# Patient Record
Sex: Male | Born: 1950
Health system: Southern US, Community
[De-identification: ages and names within clinical notes are randomized; demographics above are authoritative.]

## PROBLEM LIST (undated history)

## (undated) DIAGNOSIS — I10 Essential (primary) hypertension: Secondary | ICD-10-CM

## (undated) DIAGNOSIS — I779 Disorder of arteries and arterioles, unspecified: Secondary | ICD-10-CM

## (undated) DIAGNOSIS — I739 Peripheral vascular disease, unspecified: Secondary | ICD-10-CM

## (undated) DIAGNOSIS — J439 Emphysema, unspecified: Secondary | ICD-10-CM

## (undated) DIAGNOSIS — E785 Hyperlipidemia, unspecified: Secondary | ICD-10-CM

## (undated) DIAGNOSIS — J449 Chronic obstructive pulmonary disease, unspecified: Secondary | ICD-10-CM

## (undated) DIAGNOSIS — E119 Type 2 diabetes mellitus without complications: Secondary | ICD-10-CM

## (undated) DIAGNOSIS — I2510 Atherosclerotic heart disease of native coronary artery without angina pectoris: Secondary | ICD-10-CM

## (undated) DIAGNOSIS — Z720 Tobacco use: Secondary | ICD-10-CM

## (undated) HISTORY — DX: Peripheral vascular disease, unspecified: I73.9

## (undated) HISTORY — DX: Tobacco use: Z72.0

## (undated) HISTORY — DX: Atherosclerotic heart disease of native coronary artery without angina pectoris: I25.10

## (undated) HISTORY — DX: Chronic obstructive pulmonary disease, unspecified: J44.9

## (undated) HISTORY — DX: Hyperlipidemia, unspecified: E78.5

## (undated) HISTORY — DX: Emphysema, unspecified: J43.9

## (undated) HISTORY — DX: Disorder of arteries and arterioles, unspecified: I77.9

## (undated) HISTORY — DX: Type 2 diabetes mellitus without complications: E11.9

## (undated) HISTORY — DX: Essential (primary) hypertension: I10

---

## 1998-01-31 ENCOUNTER — Emergency Department (HOSPITAL_COMMUNITY): Admission: EM | Admit: 1998-01-31 | Discharge: 1998-01-31 | Payer: Self-pay | Admitting: Emergency Medicine

## 1998-02-05 ENCOUNTER — Ambulatory Visit (HOSPITAL_COMMUNITY): Admission: RE | Admit: 1998-02-05 | Discharge: 1998-02-05 | Payer: Self-pay | Admitting: Emergency Medicine

## 1998-02-08 ENCOUNTER — Ambulatory Visit (HOSPITAL_COMMUNITY): Admission: RE | Admit: 1998-02-08 | Discharge: 1998-02-08 | Payer: Self-pay | Admitting: Hematology & Oncology

## 1998-02-11 ENCOUNTER — Ambulatory Visit (HOSPITAL_COMMUNITY): Admission: RE | Admit: 1998-02-11 | Discharge: 1998-02-11 | Payer: Self-pay | Admitting: Hematology & Oncology

## 1999-08-29 ENCOUNTER — Encounter: Payer: Self-pay | Admitting: Hematology & Oncology

## 1999-08-29 ENCOUNTER — Encounter: Admission: RE | Admit: 1999-08-29 | Discharge: 1999-08-29 | Payer: Self-pay | Admitting: Hematology & Oncology

## 2000-09-02 ENCOUNTER — Encounter: Payer: Self-pay | Admitting: Hematology & Oncology

## 2000-09-02 ENCOUNTER — Encounter: Admission: RE | Admit: 2000-09-02 | Discharge: 2000-09-02 | Payer: Self-pay | Admitting: Hematology & Oncology

## 2001-08-24 HISTORY — PX: LOWER EXTREMITY ANGIOGRAM: SHX5955

## 2001-09-02 ENCOUNTER — Encounter: Payer: Self-pay | Admitting: Hematology & Oncology

## 2001-09-02 ENCOUNTER — Ambulatory Visit (HOSPITAL_COMMUNITY): Admission: RE | Admit: 2001-09-02 | Discharge: 2001-09-02 | Payer: Self-pay | Admitting: Hematology & Oncology

## 2001-10-13 ENCOUNTER — Ambulatory Visit: Admission: RE | Admit: 2001-10-13 | Discharge: 2001-10-13 | Payer: Self-pay | Admitting: Family Medicine

## 2002-03-20 ENCOUNTER — Encounter (INDEPENDENT_AMBULATORY_CARE_PROVIDER_SITE_OTHER): Payer: Self-pay | Admitting: *Deleted

## 2002-03-20 ENCOUNTER — Ambulatory Visit (HOSPITAL_BASED_OUTPATIENT_CLINIC_OR_DEPARTMENT_OTHER): Admission: RE | Admit: 2002-03-20 | Discharge: 2002-03-20 | Payer: Self-pay | Admitting: *Deleted

## 2002-08-18 ENCOUNTER — Ambulatory Visit (HOSPITAL_COMMUNITY): Admission: RE | Admit: 2002-08-18 | Discharge: 2002-08-19 | Payer: Self-pay | Admitting: Cardiovascular Disease

## 2003-02-19 ENCOUNTER — Emergency Department (HOSPITAL_COMMUNITY): Admission: EM | Admit: 2003-02-19 | Discharge: 2003-02-20 | Payer: Self-pay | Admitting: Emergency Medicine

## 2003-02-20 ENCOUNTER — Encounter: Payer: Self-pay | Admitting: Emergency Medicine

## 2006-11-23 HISTORY — PX: LOWER EXTREMITY ANGIOGRAM: SHX5955

## 2006-11-30 ENCOUNTER — Encounter: Admission: RE | Admit: 2006-11-30 | Discharge: 2006-11-30 | Payer: Self-pay | Admitting: Cardiovascular Disease

## 2006-11-30 IMAGING — CR DG CHEST 2V
2 series · 2 of 2 positions shown · non-contrast
Comparison: none

CLINICAL DATA: Peripheral vascular disease.  Pre angiogram. 
 CHEST X-RAY: 
 Two views of the chest show no active infiltrative or effusion.  Mild peribronchial thickening is noted.  The heart is within normal limits in size.

[w chest pa]
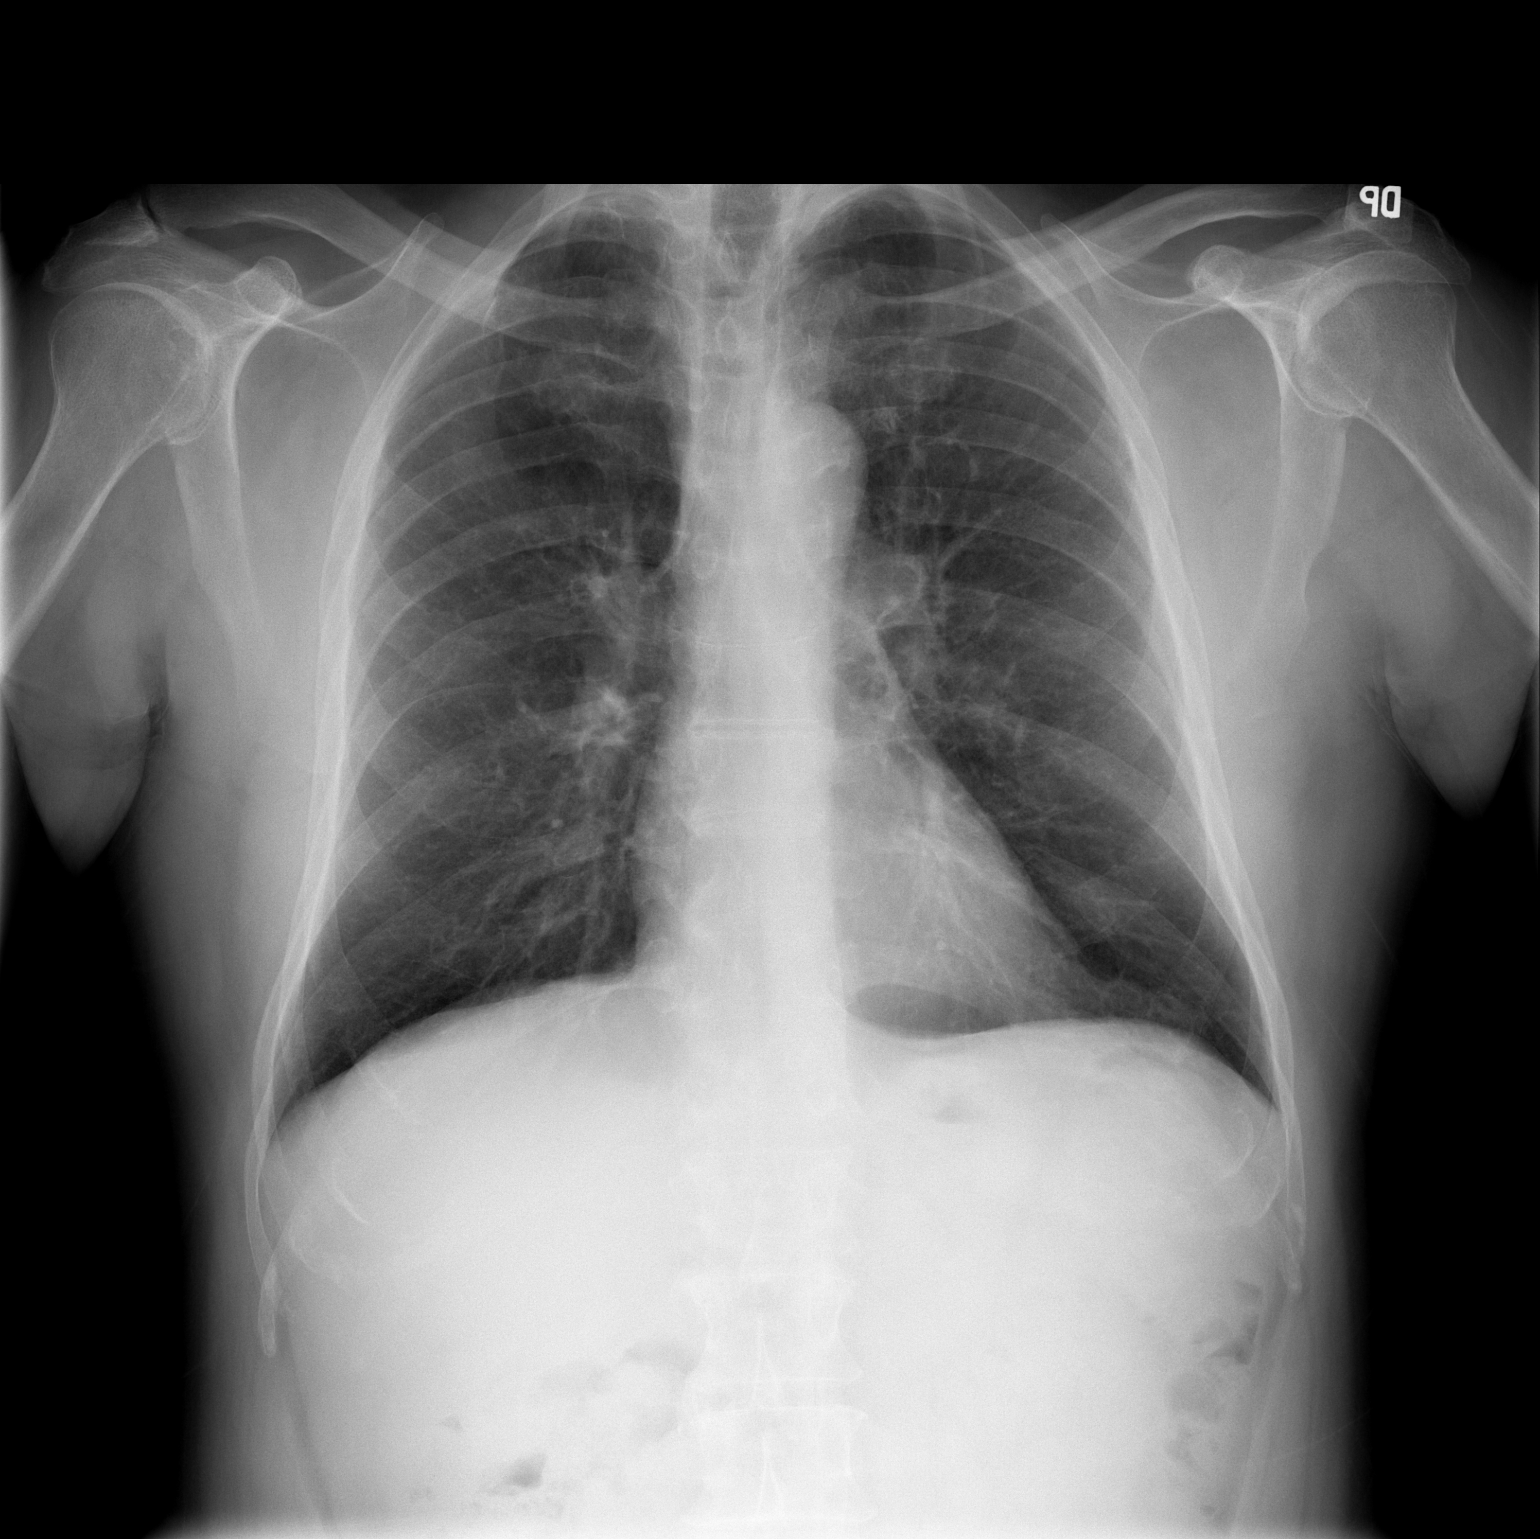

[w chest lat]
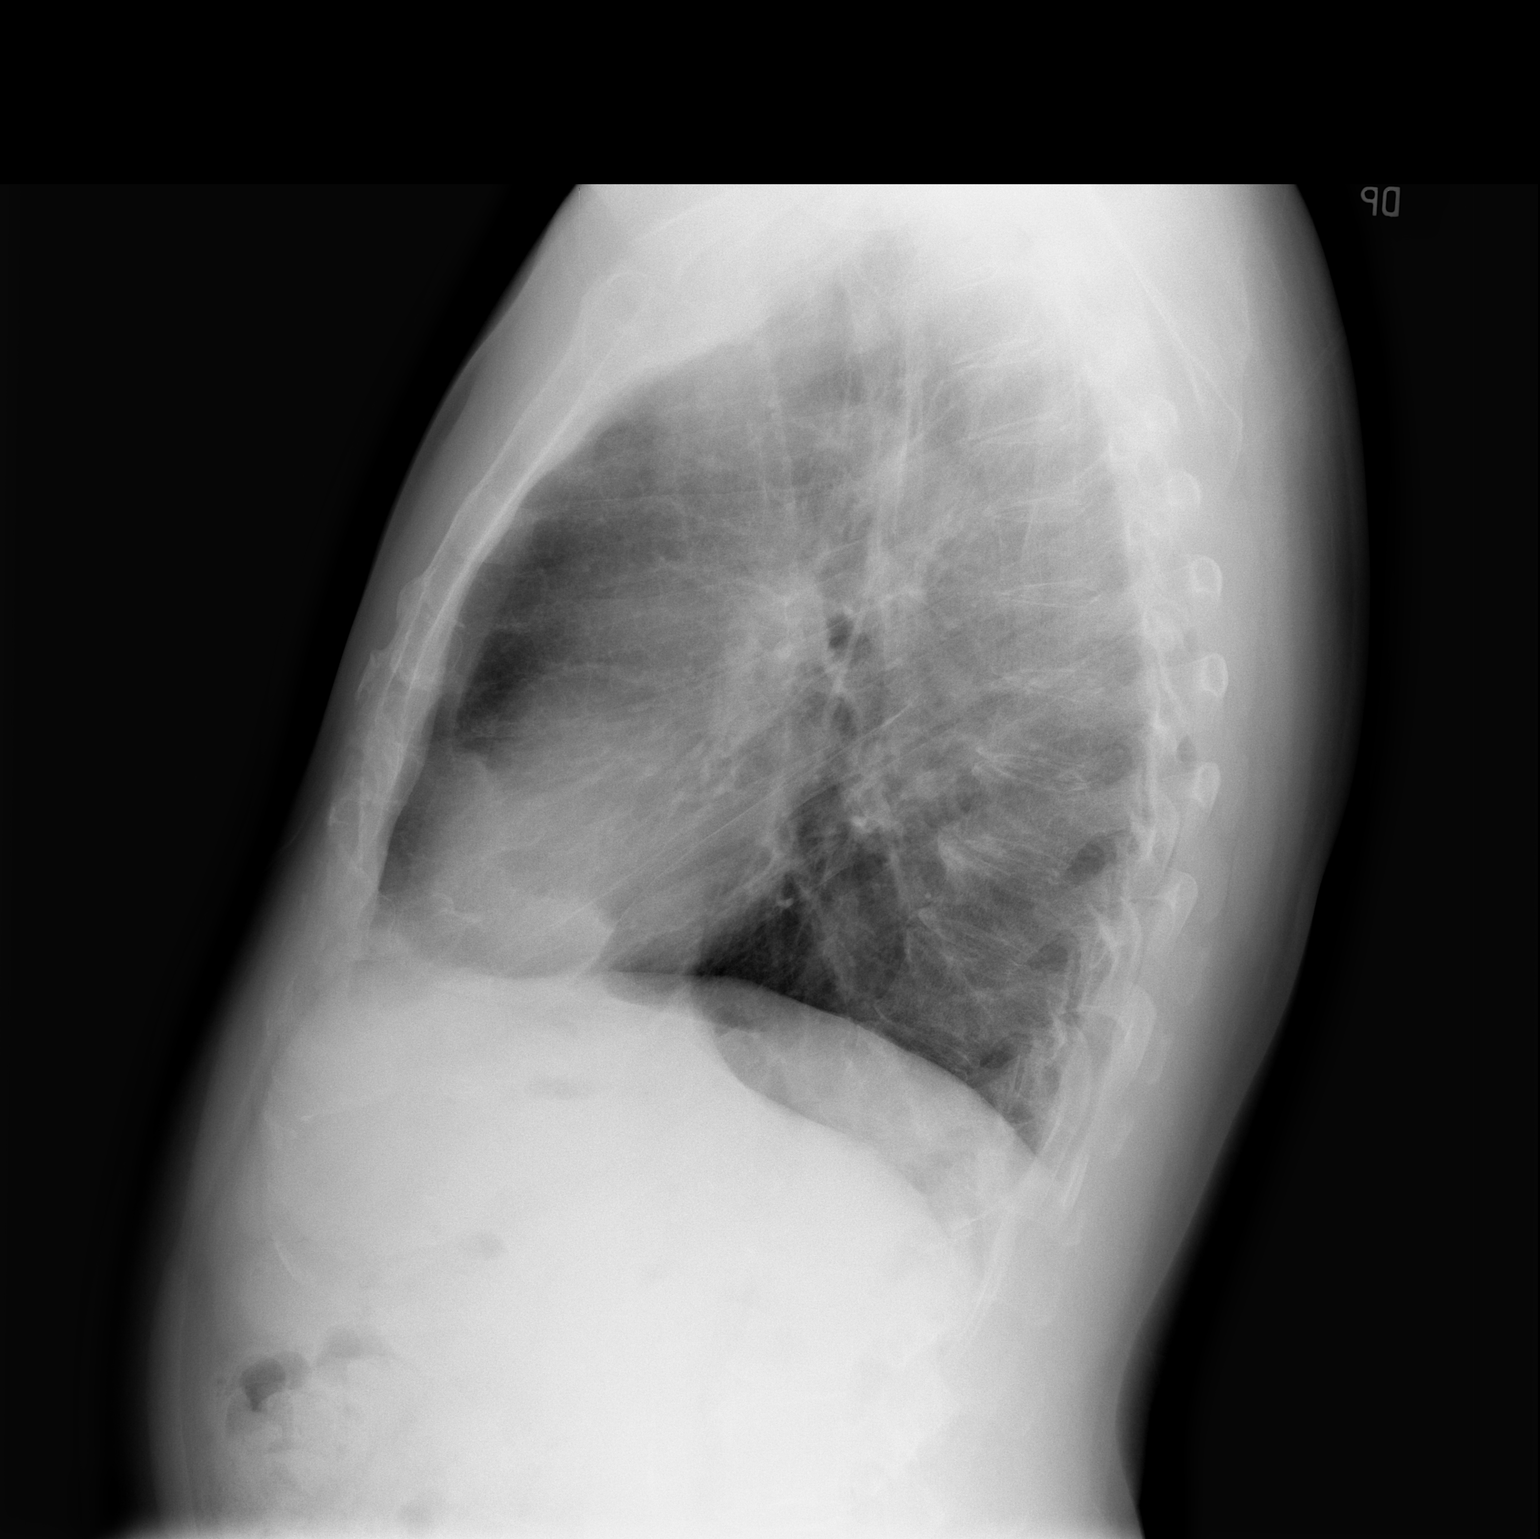

[2 of 2 positions shown; findings below may reference images not displayed]

IMPRESSION: No active lung disease.

## 2006-12-06 ENCOUNTER — Observation Stay (HOSPITAL_COMMUNITY): Admission: RE | Admit: 2006-12-06 | Discharge: 2006-12-07 | Payer: Self-pay | Admitting: Cardiovascular Disease

## 2008-08-24 HISTORY — PX: DOPPLER ECHOCARDIOGRAPHY: SHX263

## 2008-08-24 HISTORY — PX: CARDIAC CATHETERIZATION: SHX172

## 2008-08-24 HISTORY — PX: CORONARY ARTERY BYPASS GRAFT: SHX141

## 2008-10-18 ENCOUNTER — Ambulatory Visit: Payer: Self-pay | Admitting: Thoracic Surgery (Cardiothoracic Vascular Surgery)

## 2008-10-24 IMAGING — CR DG CHEST 2V
2 series · 2 of 2 positions shown · non-contrast
Comparison: [DATE]

CLINICAL DATA: CAD, preop.

CHEST - 2 VIEW

[view not recorded (1 of 2)]
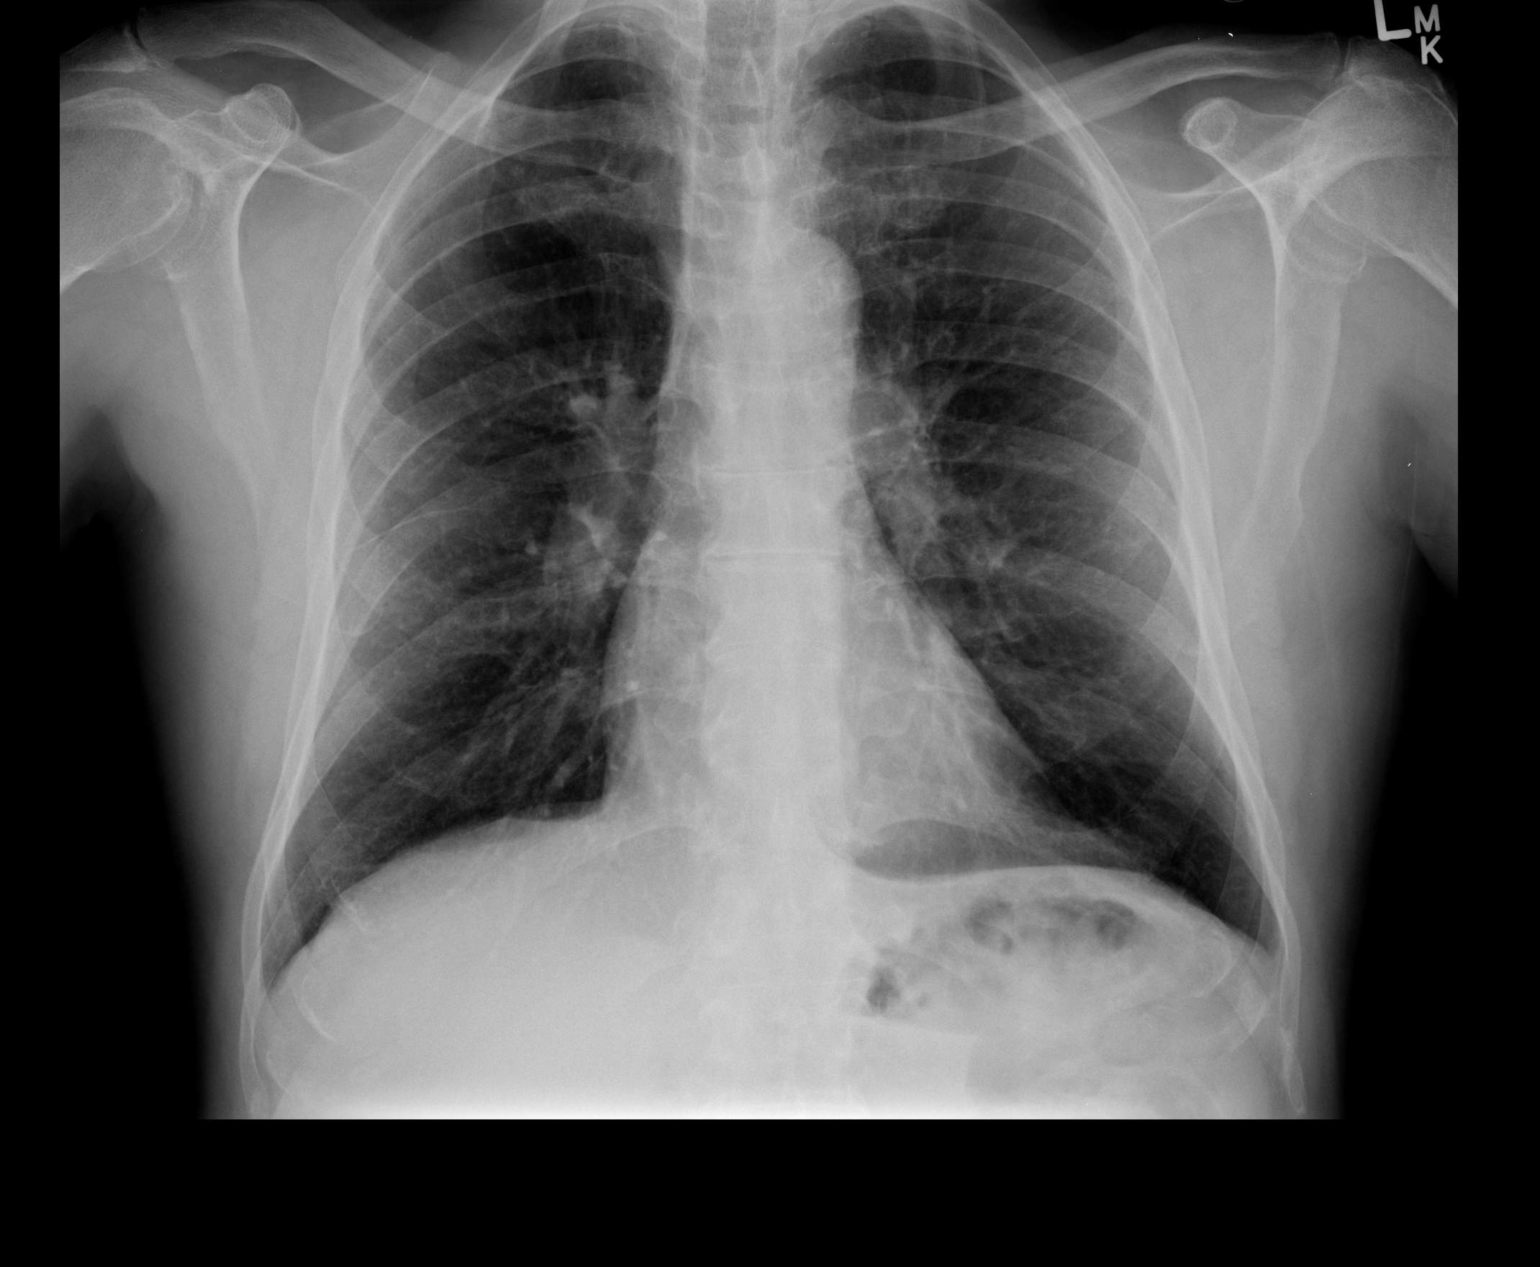

[view not recorded (2 of 2)]
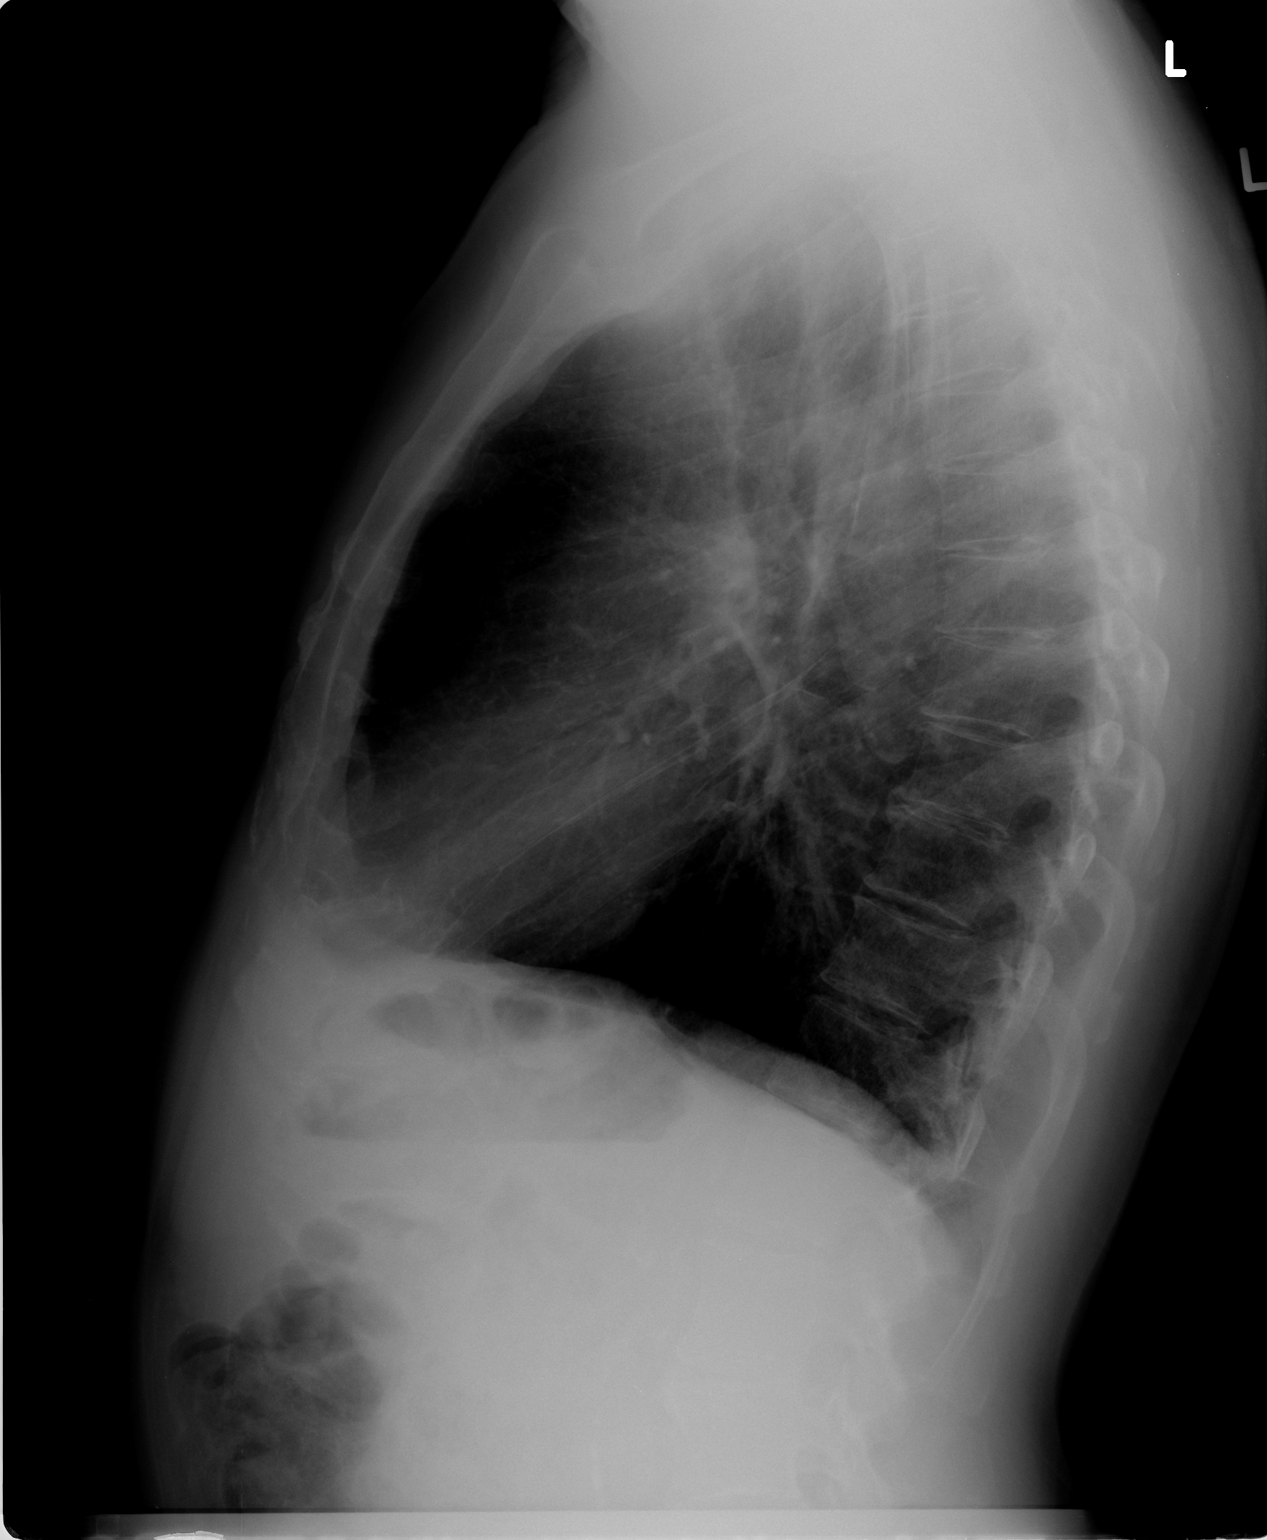

[2 of 2 positions shown; findings below may reference images not displayed]

FINDINGS: Heart and mediastinal contours are within normal limits.
No focal opacities or effusions.  No acute bony abnormality. There
is hyperinflation of the lungs compatible with COPD.
IMPRESSION: Mild COPD.  No acute findings.

## 2008-10-26 ENCOUNTER — Ambulatory Visit: Payer: Self-pay | Admitting: Thoracic Surgery (Cardiothoracic Vascular Surgery)

## 2008-10-26 ENCOUNTER — Inpatient Hospital Stay (HOSPITAL_COMMUNITY)
Admission: RE | Admit: 2008-10-26 | Discharge: 2008-11-02 | Payer: Self-pay | Admitting: Thoracic Surgery (Cardiothoracic Vascular Surgery)

## 2008-10-26 IMAGING — CR DG CHEST 1V PORT
1 series · 1 of 1 positions shown · non-contrast
Comparison: [DATE]

CLINICAL DATA: CABG.

PORTABLE CHEST - 1 VIEW

[view not recorded]
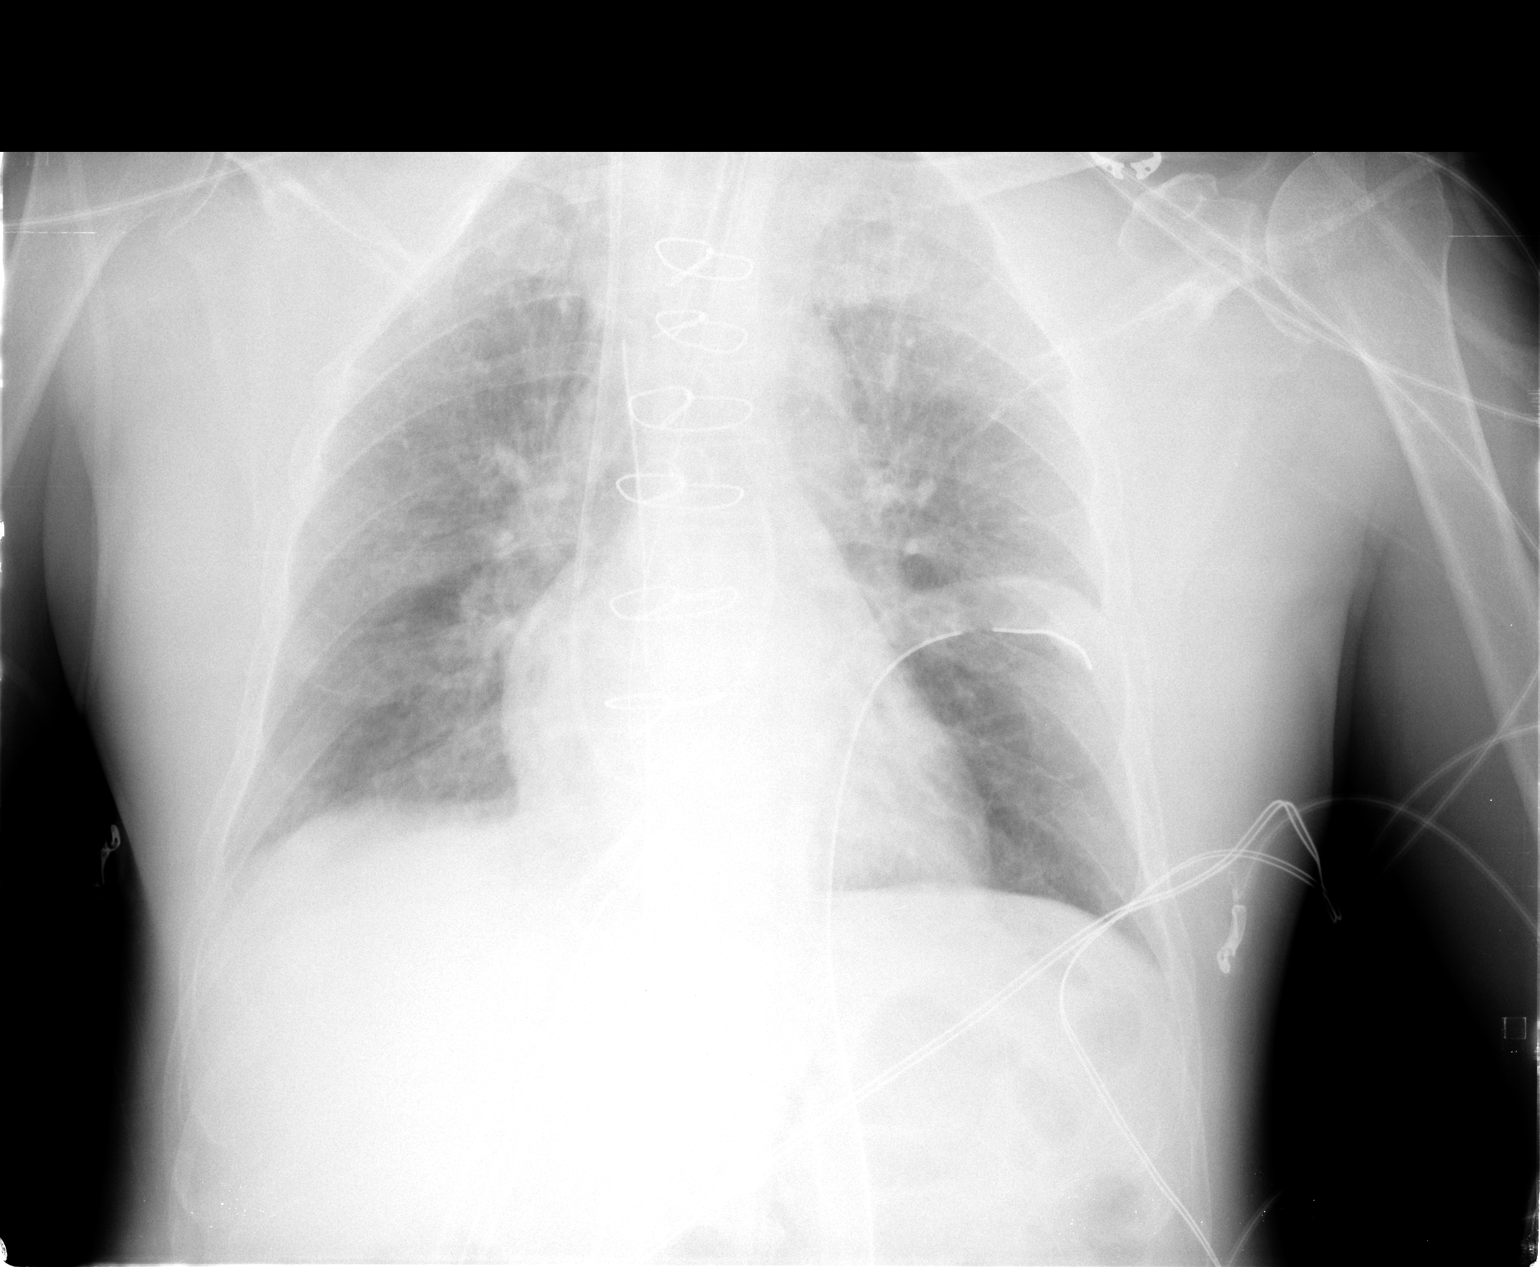

[1 of 1 positions shown; findings below may reference images not displayed]

FINDINGS: Endotracheal tube is in satisfactory position.  Right IJ
Swan-Ganz catheter tip projects over the proximal right pulmonary
artery.  Nasogastric tube terminates just beyond the
gastroesophageal junction.  Mediastinal drain and bilateral chest
tubes are in place.

No definite pneumothorax.  No edema.  No pleural fluid.
IMPRESSION: Interval sternotomy without immediate complicating feature.

## 2008-10-27 IMAGING — CR DG CHEST 1V PORT
1 series · 1 of 1 positions shown · non-contrast
Comparison: [DATE]

CLINICAL DATA: CABG.

PORTABLE CHEST - 1 VIEW

[view not recorded]
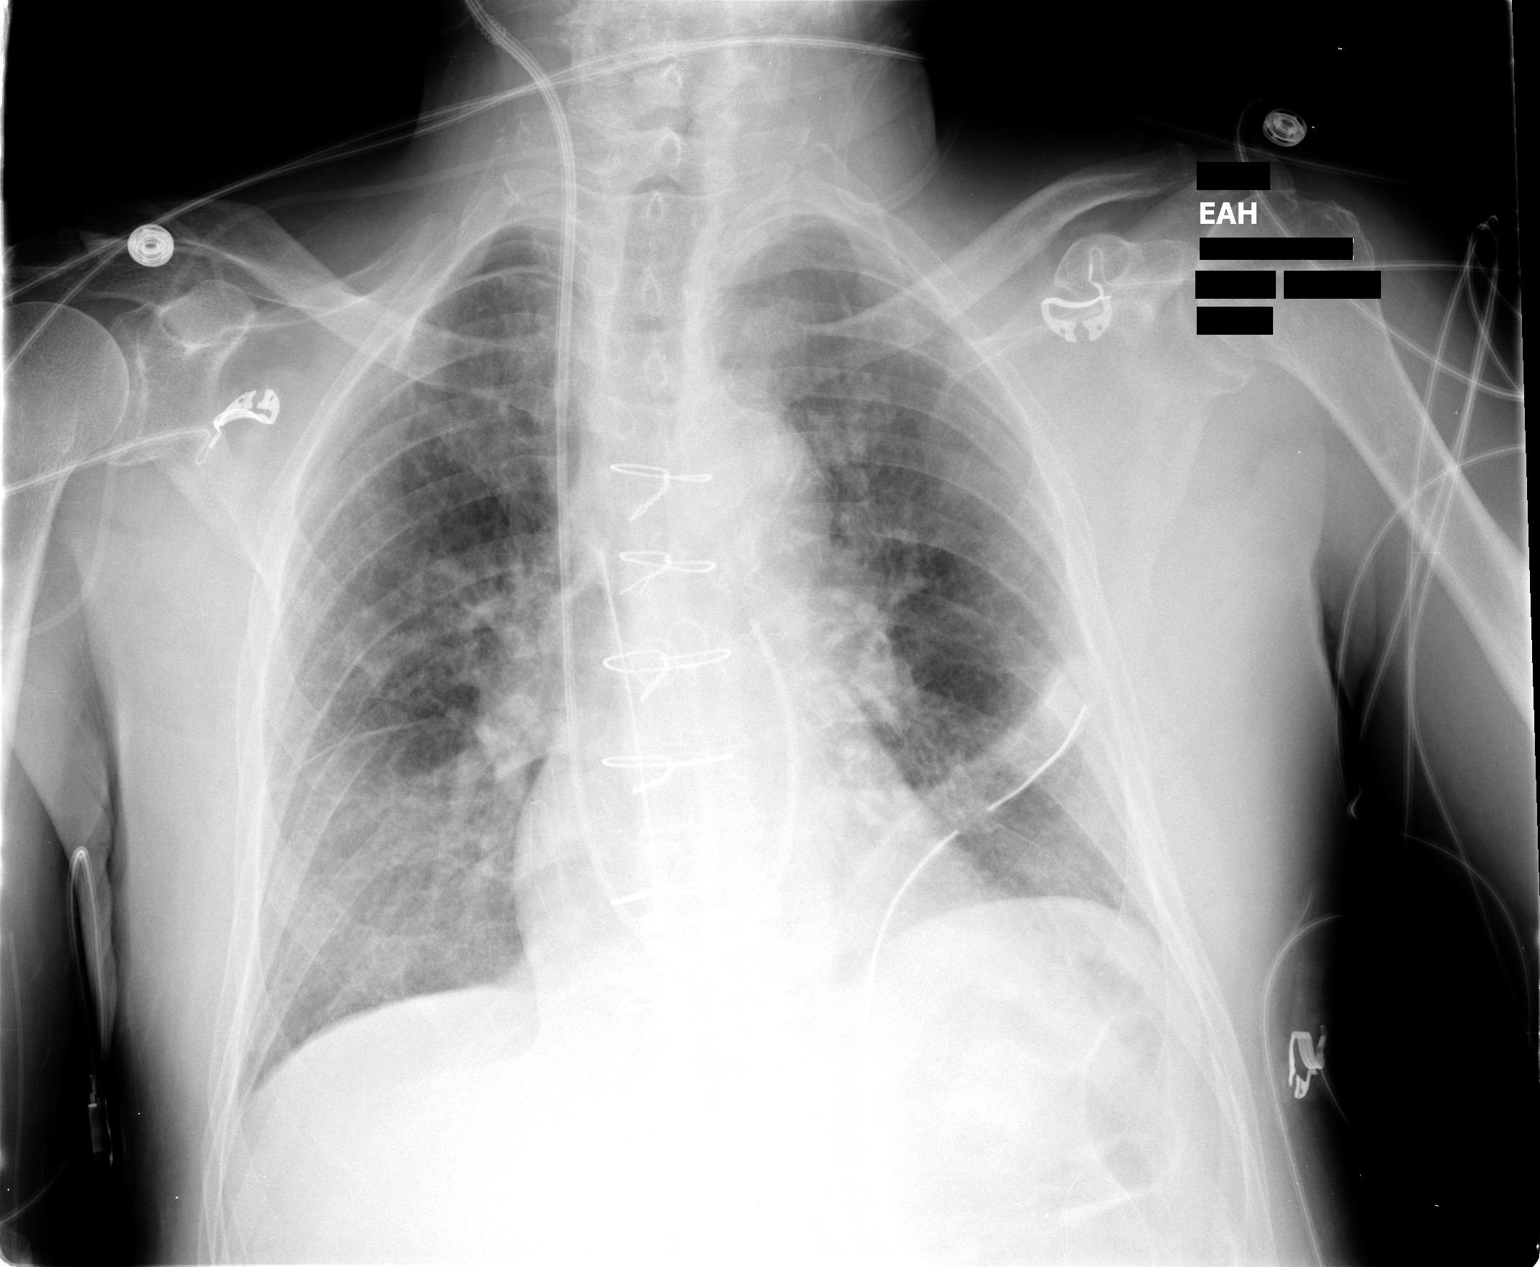

[1 of 1 positions shown; findings below may reference images not displayed]

FINDINGS: Interval extubation.  Left chest tube and Swan-Ganz
catheter remain in place, unchanged.  No pneumothorax.

The patient is status post CABG.  Heart is normal size.  Diffuse
interstitial prominence noted, similar to prior study.  No
confluent opacities or effusions.
IMPRESSION: Interval extubation.  Otherwise no change.  No pneumothorax.

## 2008-10-28 IMAGING — CR DG CHEST 1V PORT
1 series · 1 of 1 positions shown · non-contrast
Comparison: [DATE]

CLINICAL DATA: CAD.

PORTABLE CHEST - 1 VIEW

[view not recorded]
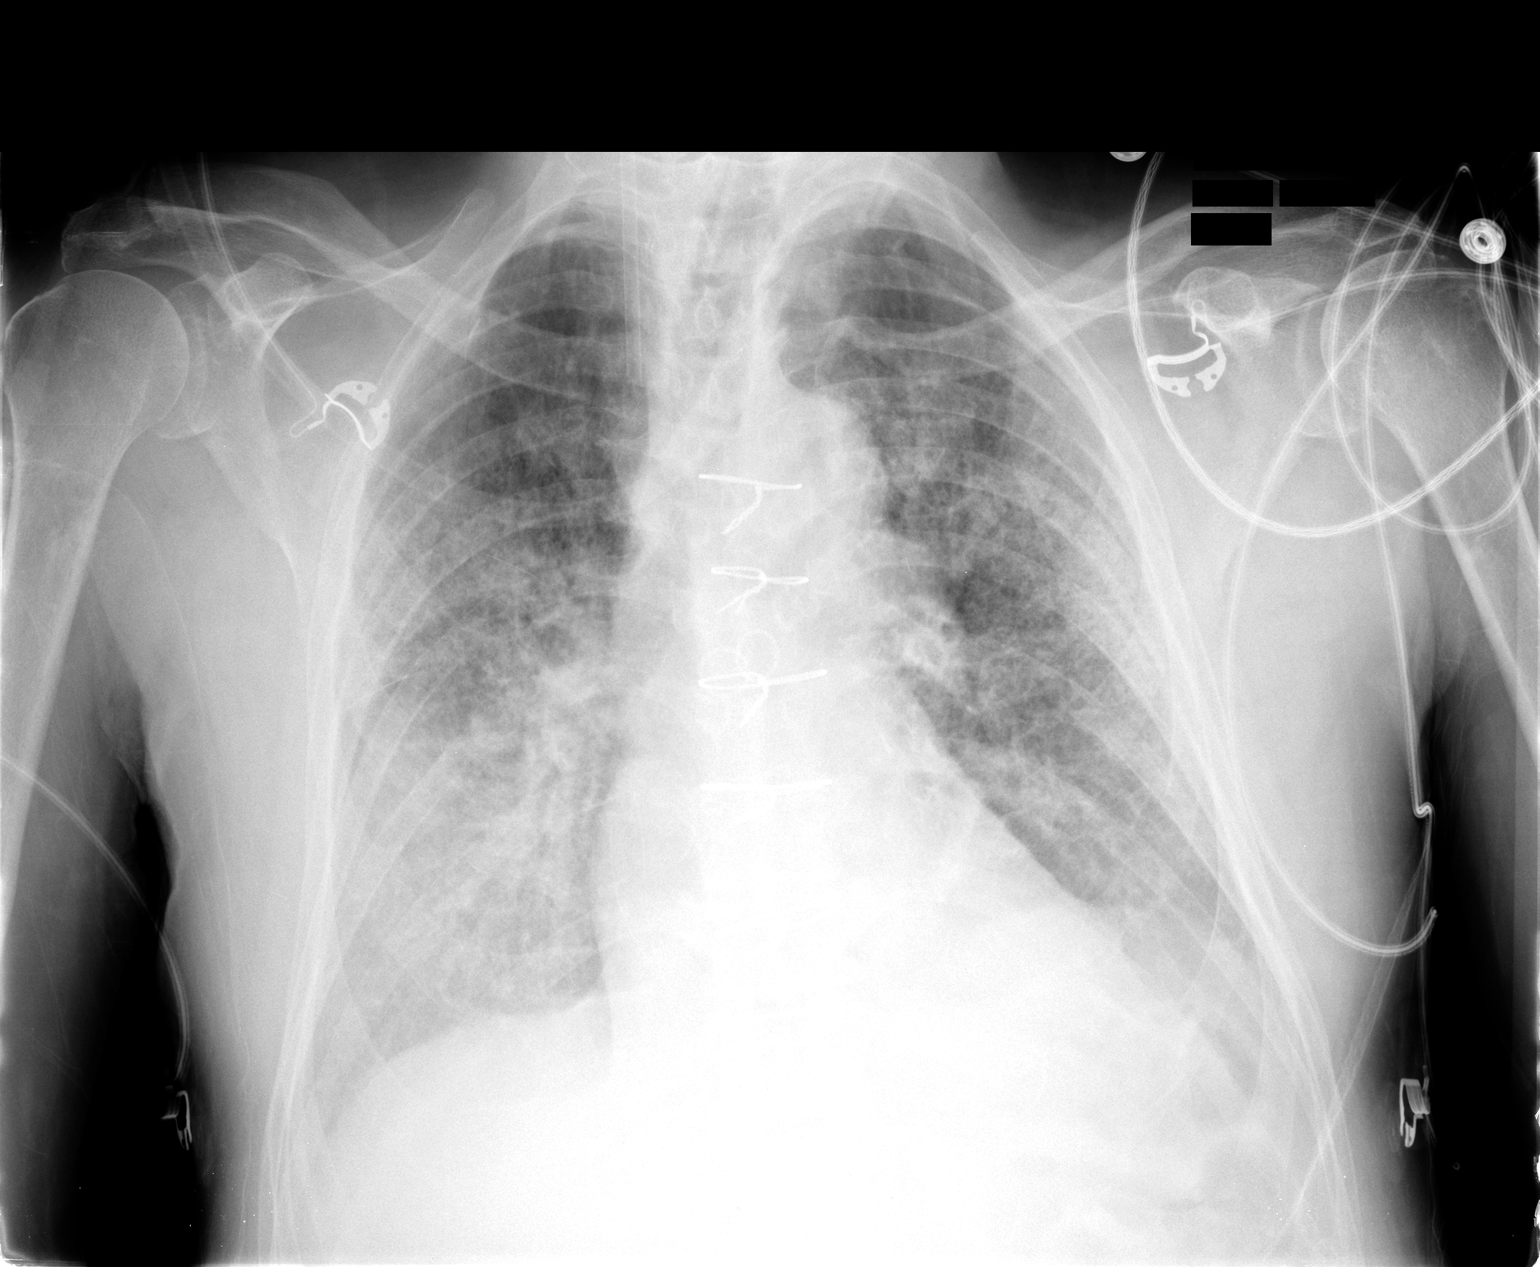

[1 of 1 positions shown; findings below may reference images not displayed]

FINDINGS: Increase in diffuse airspace disease.  The film is of a
lighter technique which will accentuate that finding.  No
pneumothorax.  KLEVER catheters been removed with catheter
sheath noted in the right jugular vein.  Chest tubes have been
removed.
IMPRESSION: Diffuse increase in airspace disease.  No pneumothorax.

## 2008-10-29 IMAGING — CR DG CHEST 1V PORT
1 series · 1 of 1 positions shown · non-contrast
Comparison: [DATE]

CLINICAL DATA: Follow-up CABG

PORTABLE CHEST - 1 VIEW

[AP]
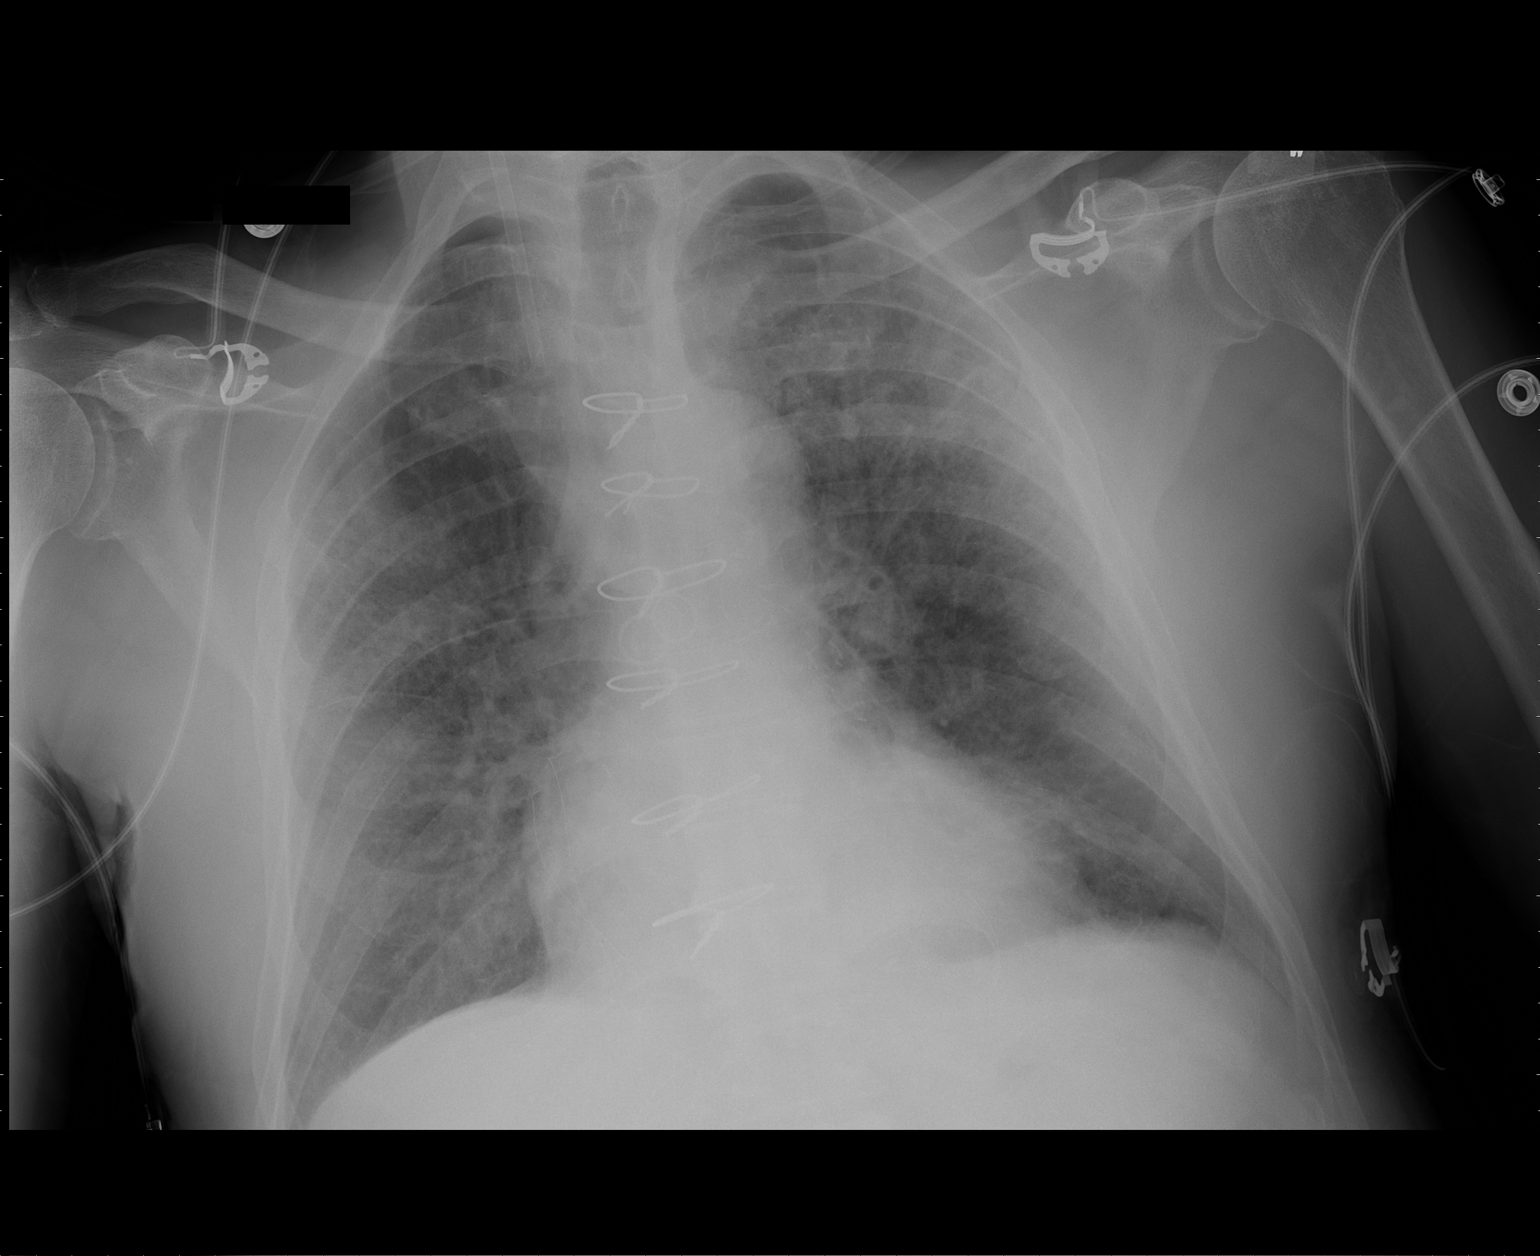

[1 of 1 positions shown; findings below may reference images not displayed]

FINDINGS: Right internal jugular venous access sheath remains in
place.  There is no pneumothorax.  Diffuse interstitial density
persists consistent with mild edema.  No significant change since
yesterday, but definitely worsened when compared to the the
preoperative appearance.
IMPRESSION: Persistent diffuse interstitial density consistent with edema.

## 2008-10-30 IMAGING — CR DG CHEST 2V
2 series · 2 of 2 positions shown · non-contrast
Comparison: [DATE]

CLINICAL DATA: Bypass surgery.

CHEST - 2 VIEW

[w chest pa]
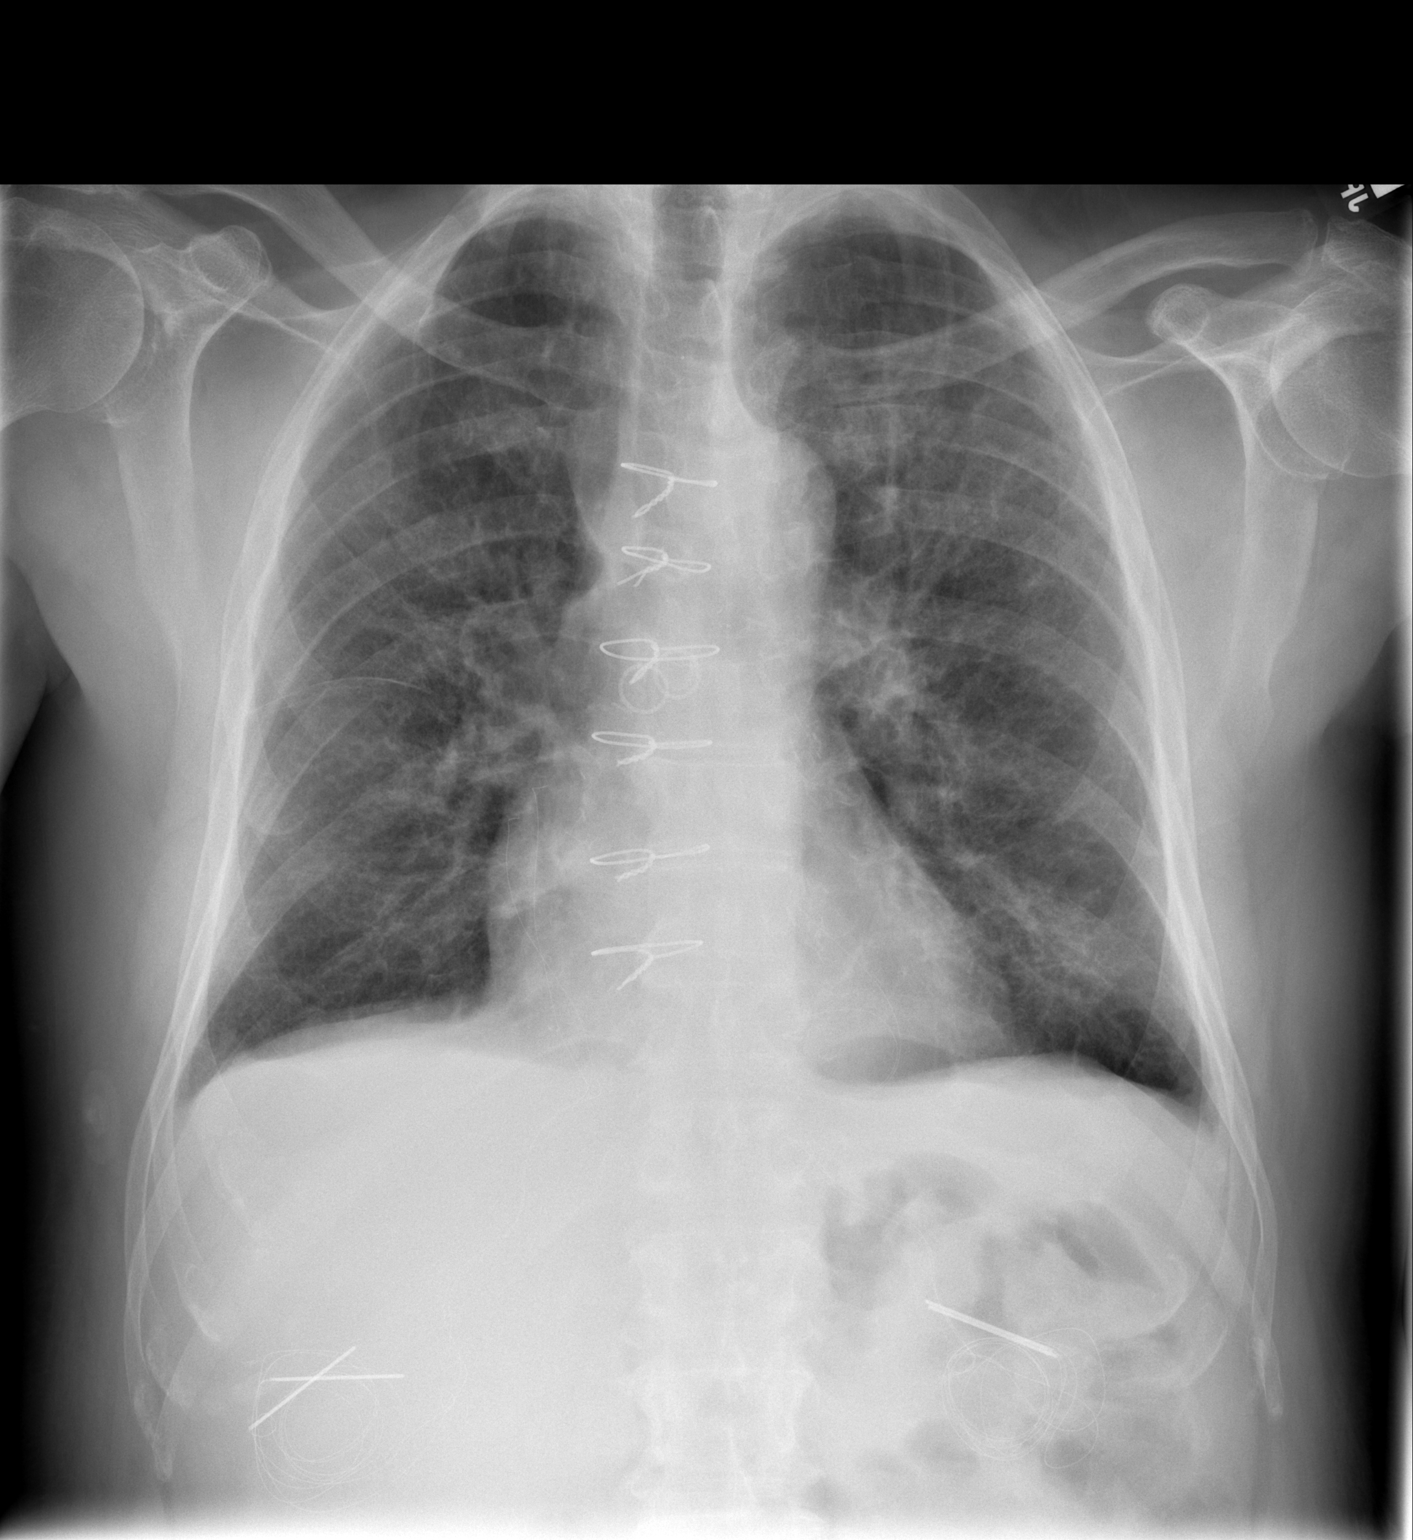

[w chest lat]
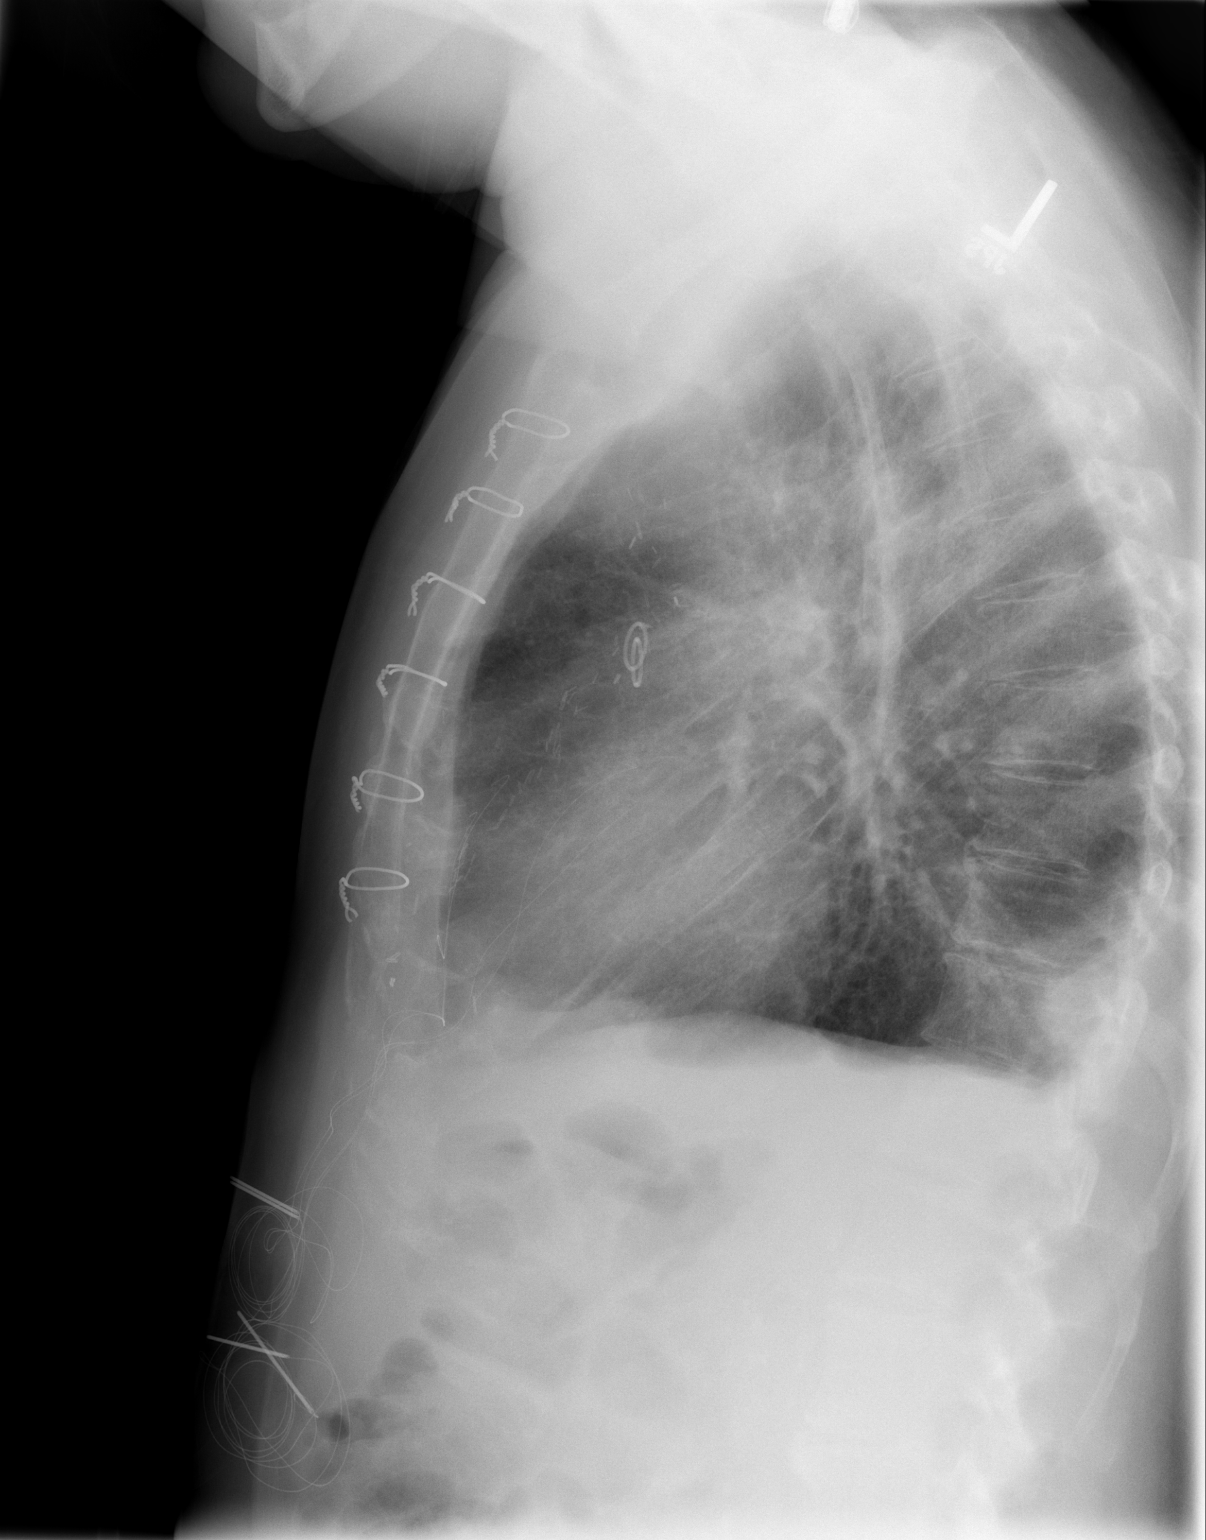

[2 of 2 positions shown; findings below may reference images not displayed]

FINDINGS: The right IJ Cordis has been removed.  The lungs are much
better aerated with resolution of pulmonary edema.  No significant
pleural effusions.  No pneumothorax.
IMPRESSION: 1.  Improved lung aeration with resolution of pulmonary edema.
2.  The right IJ Cordis has been removed.
3.  Stable epicardial pacer wires.

## 2008-11-30 ENCOUNTER — Ambulatory Visit: Payer: Self-pay | Admitting: Thoracic Surgery (Cardiothoracic Vascular Surgery)

## 2008-11-30 ENCOUNTER — Encounter
Admission: RE | Admit: 2008-11-30 | Discharge: 2008-11-30 | Payer: Self-pay | Admitting: Thoracic Surgery (Cardiothoracic Vascular Surgery)

## 2008-11-30 IMAGING — CR DG CHEST 2V
2 series · 2 of 2 positions shown · non-contrast
Comparison: [DATE]

CLINICAL DATA: CAD, CABG.

CHEST - 2 VIEW

[view not recorded (1 of 2)]
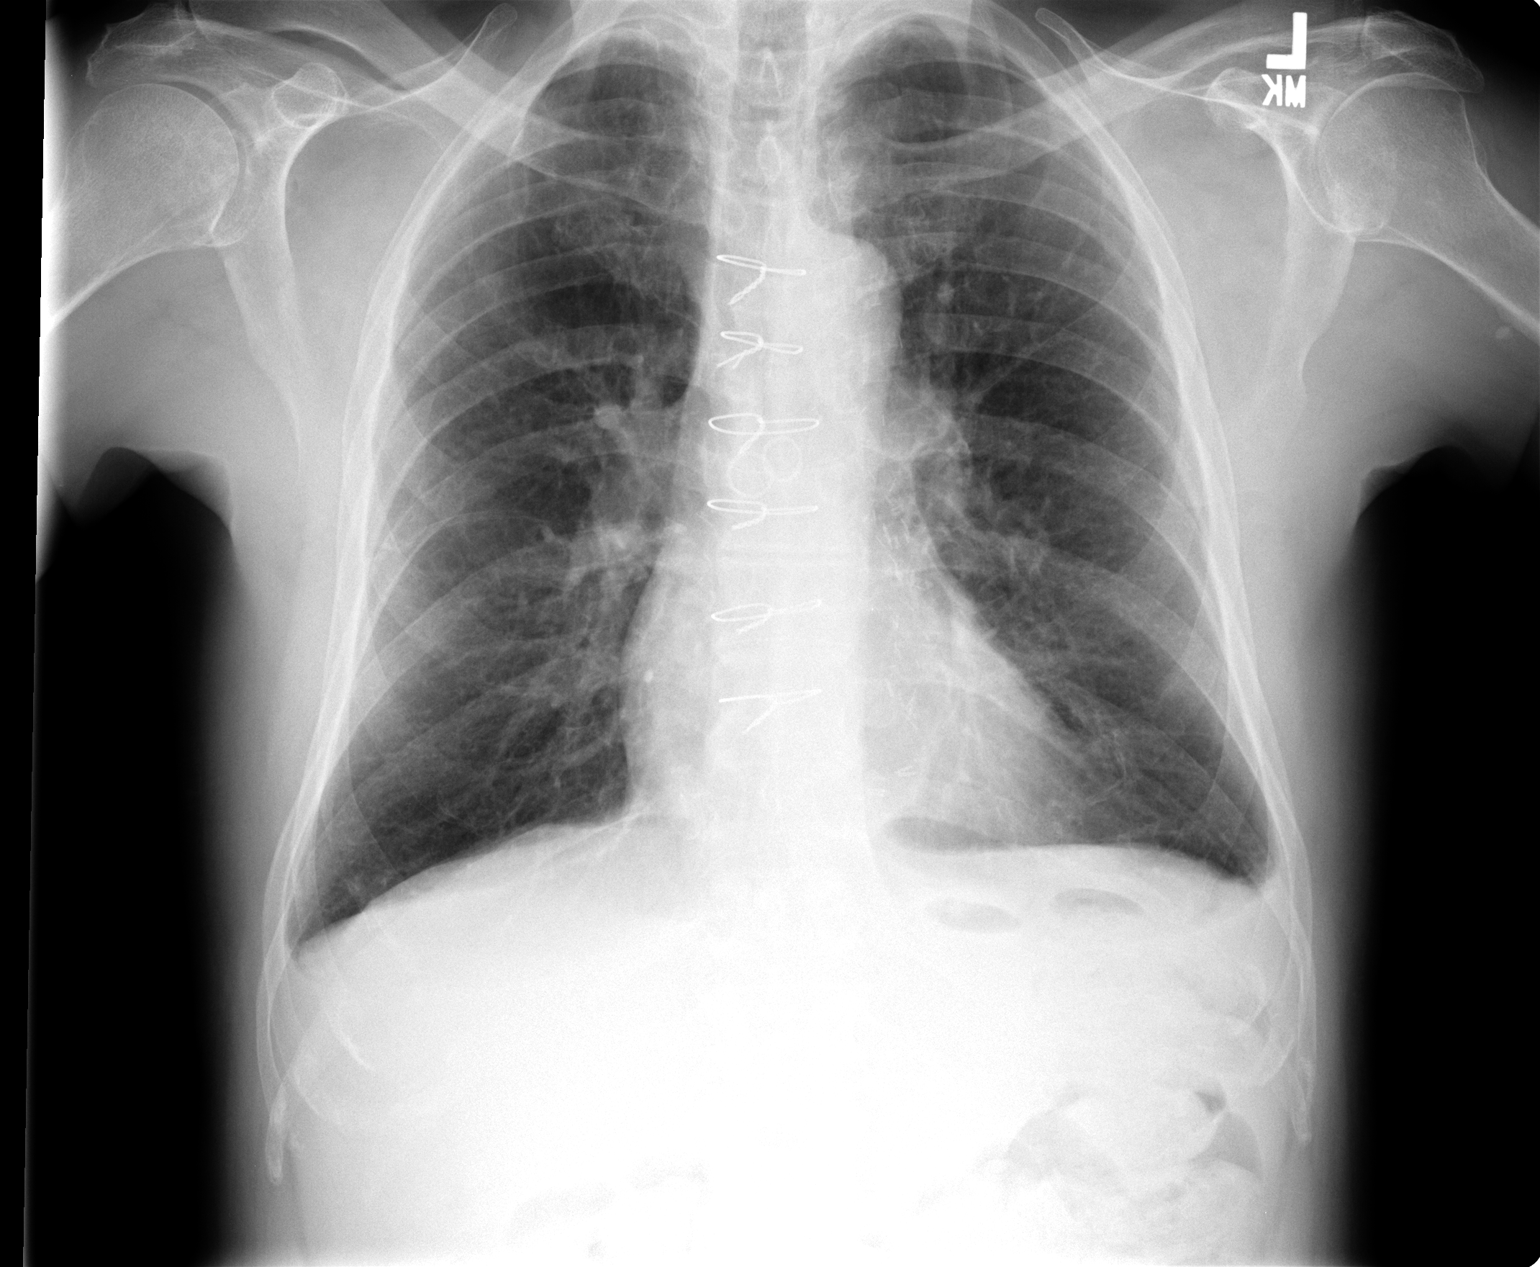

[view not recorded (2 of 2)]
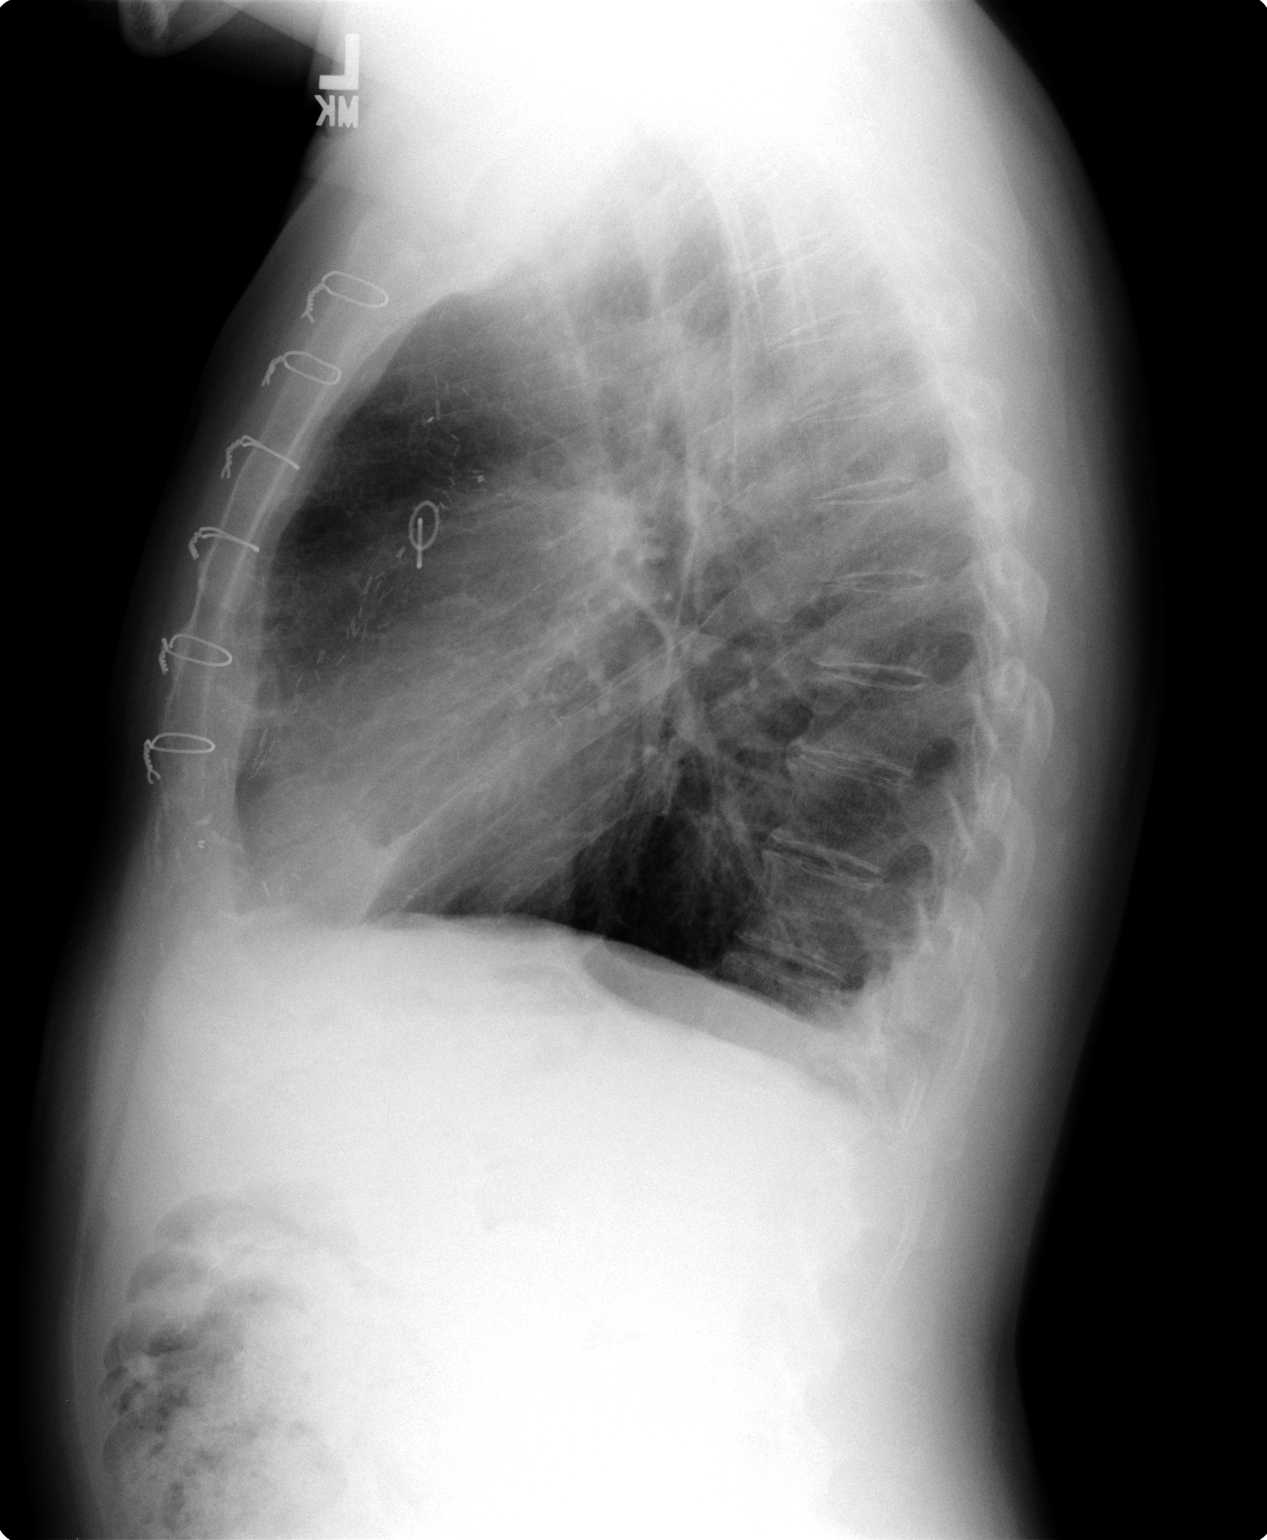

[2 of 2 positions shown; findings below may reference images not displayed]

FINDINGS: Trace left pleural effusion noted.  No confluent
opacities in the lungs.  No pneumothorax.  The patient is status
post CABG.  Heart is normal size.
IMPRESSION: Trace left effusion.

## 2008-12-13 ENCOUNTER — Encounter (HOSPITAL_COMMUNITY): Admission: RE | Admit: 2008-12-13 | Discharge: 2008-12-22 | Payer: Self-pay | Admitting: Cardiovascular Disease

## 2009-01-14 ENCOUNTER — Encounter (HOSPITAL_COMMUNITY): Admission: RE | Admit: 2009-01-14 | Discharge: 2009-03-24 | Payer: Self-pay | Admitting: Cardiovascular Disease

## 2010-06-12 ENCOUNTER — Emergency Department (HOSPITAL_COMMUNITY): Admission: EM | Admit: 2010-06-12 | Discharge: 2010-06-12 | Payer: Self-pay | Admitting: Emergency Medicine

## 2010-06-12 IMAGING — CR DG HIP COMPLETE 2+V*R*
4 series · 4 of 4 positions shown · non-contrast
Comparison: None.

CLINICAL DATA: Right hip pain for 4 days

RIGHT HIP - COMPLETE 2+ VIEW

[t pelvis a.p.]
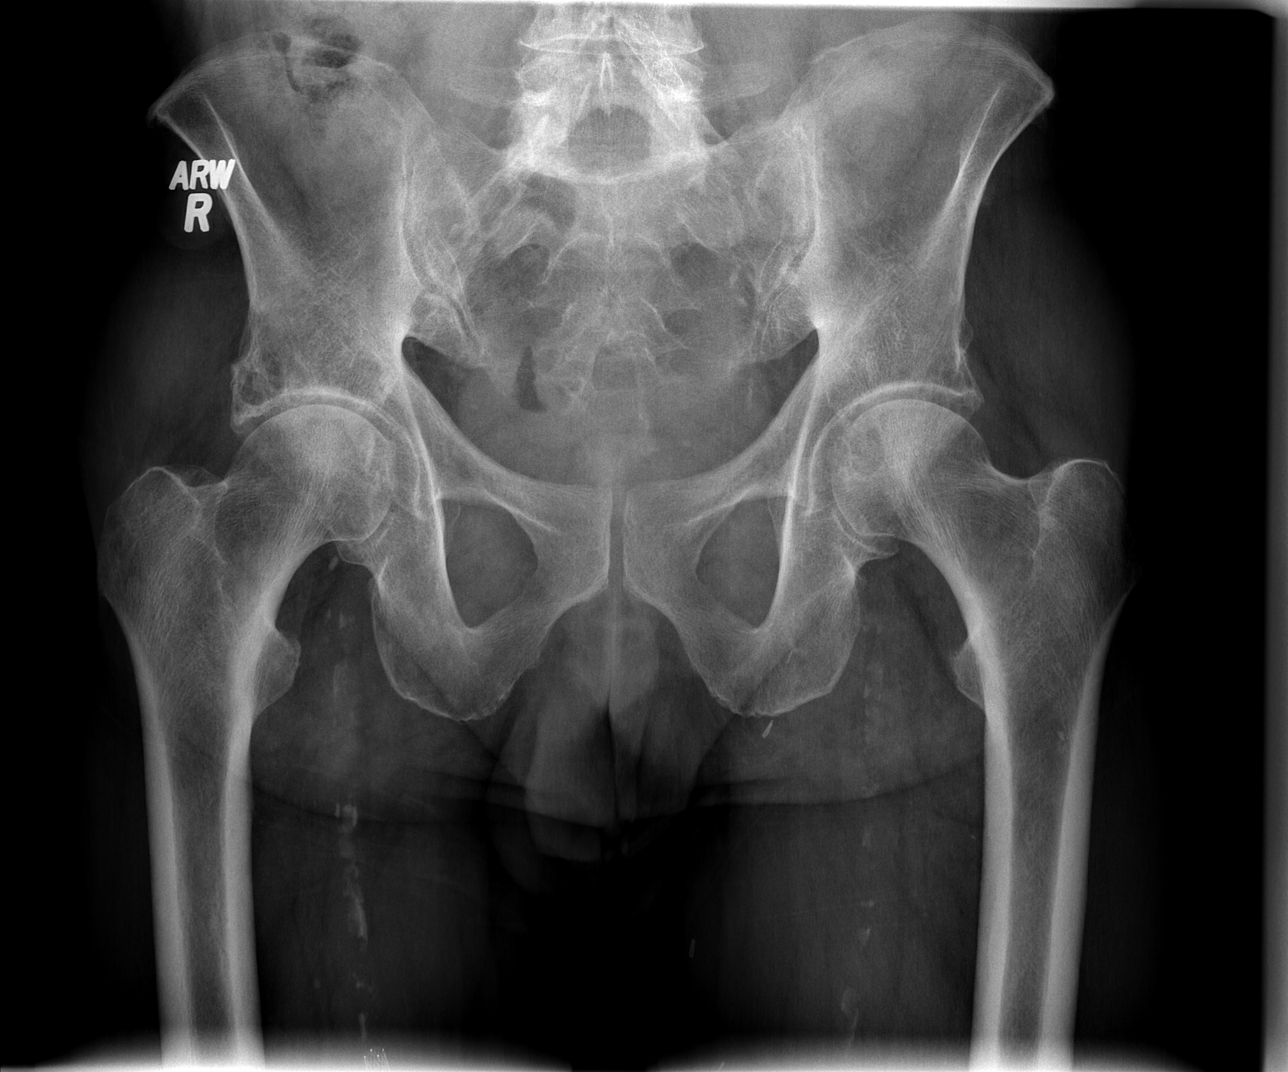

[t hip ap right]
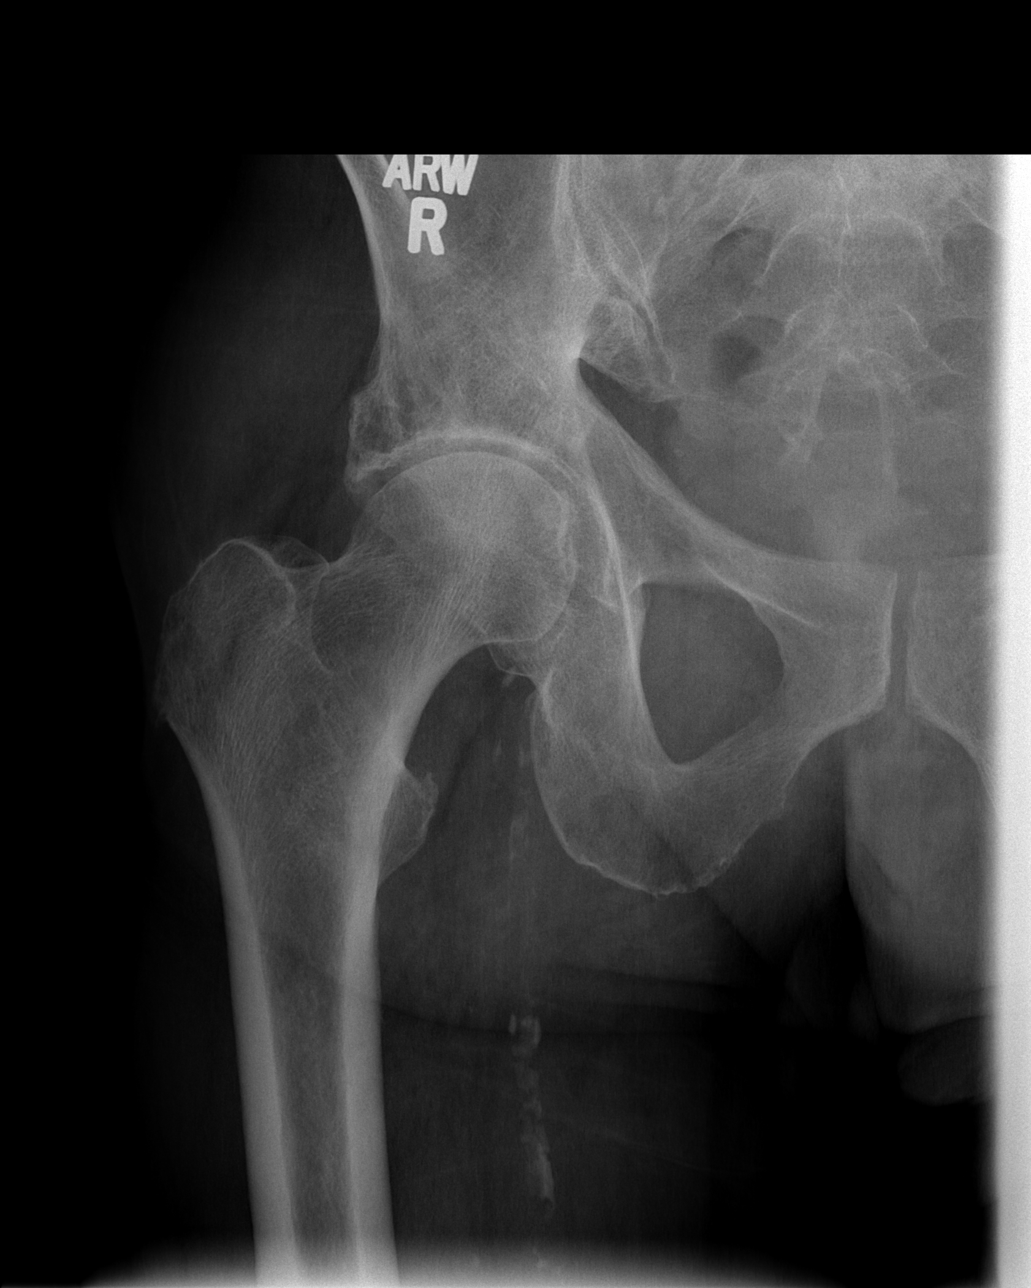

[t hip frog leg right (1 of 2)]
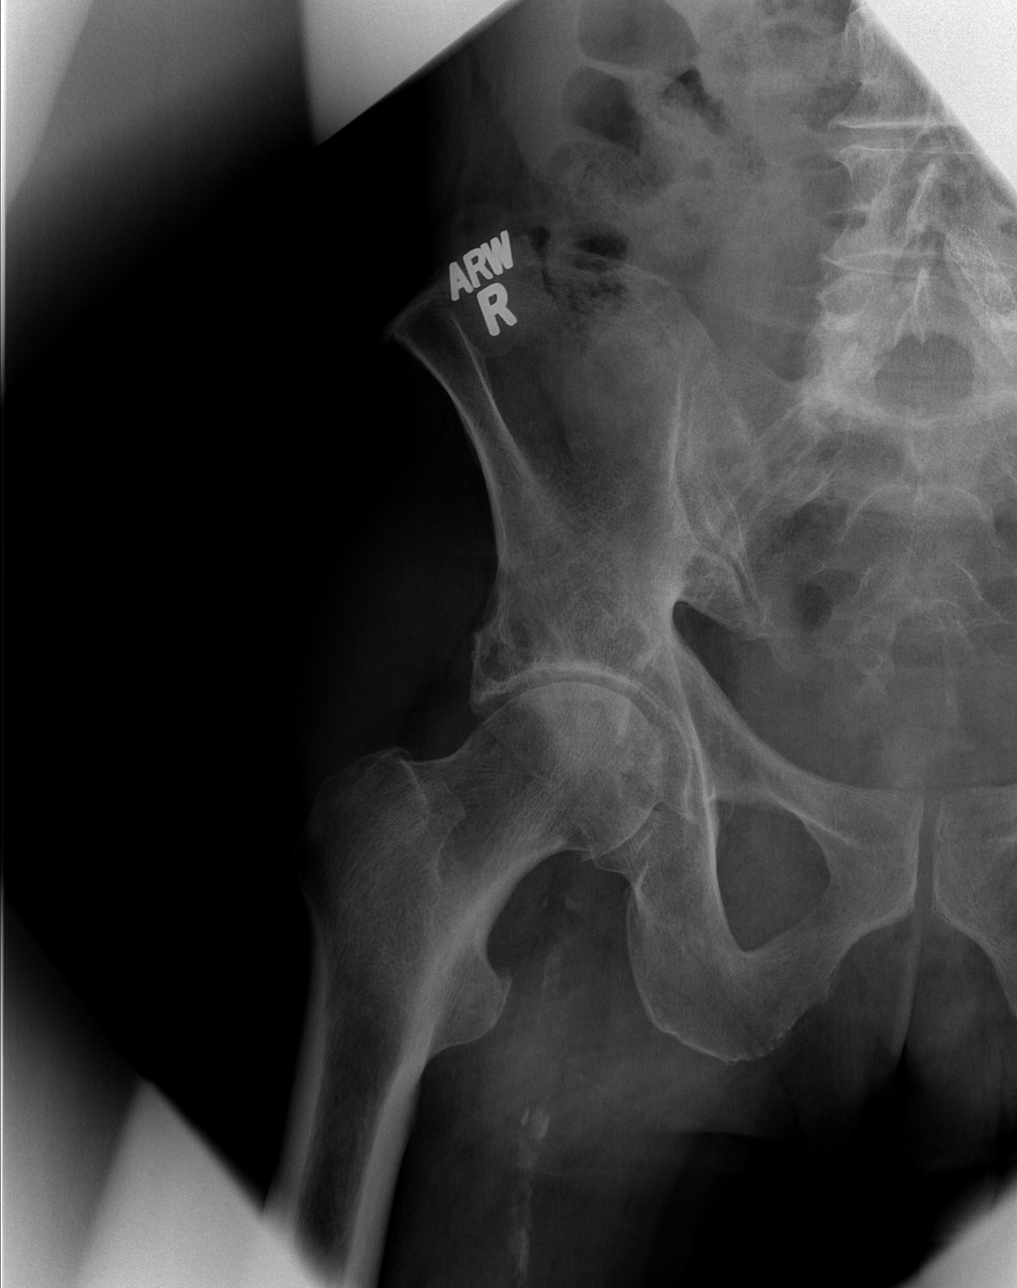

[t hip frog leg right (2 of 2)]
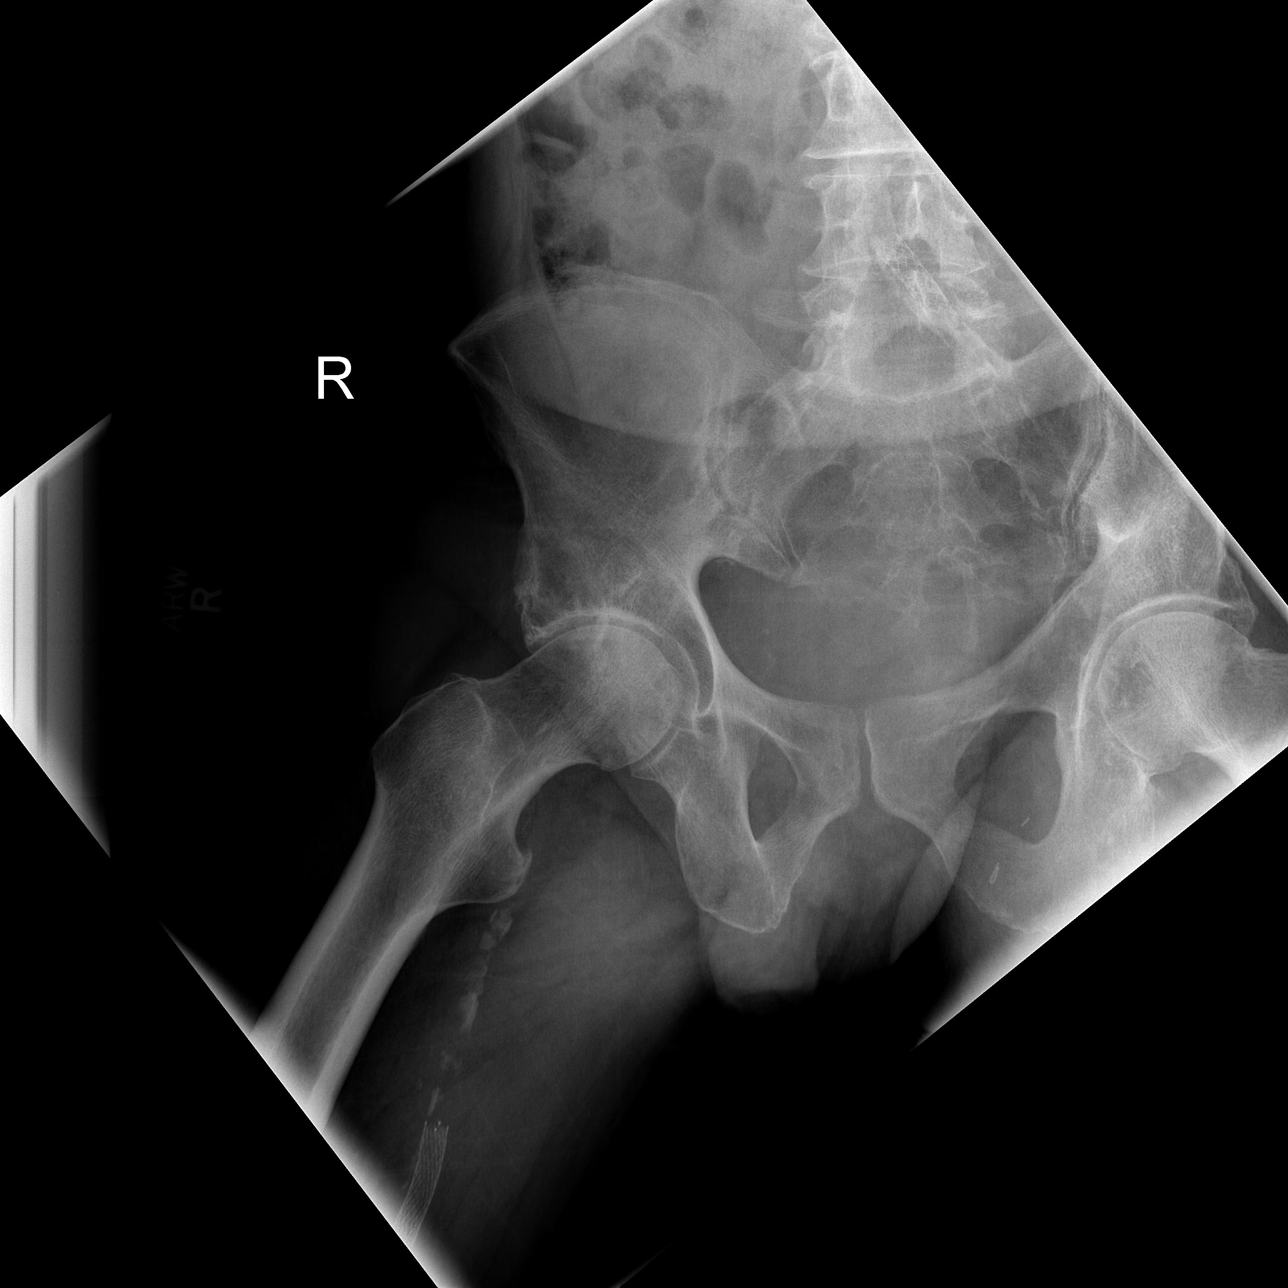

[4 of 4 positions shown; findings below may reference images not displayed]

FINDINGS: Four views of the right hip submitted.  No acute fracture
or subluxation.  Mild degenerative narrowing of superior joint
space.  Mild degenerative changes with subchondral cystic changes
noted right superior acetabulum.

Atherosclerotic calcifications bilateral femoral artery.
IMPRESSION: No acute fracture or subluxation.  Mild degenerative changes.

## 2010-11-05 LAB — DIFFERENTIAL
Eosinophils Absolute: 0 10*3/uL (ref 0.0–0.7)
Lymphs Abs: 1.2 10*3/uL (ref 0.7–4.0)
Monocytes Relative: 7 % (ref 3–12)
Neutrophils Relative %: 80 % — ABNORMAL HIGH (ref 43–77)

## 2010-11-05 LAB — CBC
HCT: 36.4 % — ABNORMAL LOW (ref 39.0–52.0)
Hemoglobin: 12.4 g/dL — ABNORMAL LOW (ref 13.0–17.0)
MCH: 31.7 pg (ref 26.0–34.0)
MCV: 93.1 fL (ref 78.0–100.0)
RBC: 3.91 MIL/uL — ABNORMAL LOW (ref 4.22–5.81)

## 2010-12-03 LAB — GLUCOSE, CAPILLARY: Glucose-Capillary: 142 mg/dL — ABNORMAL HIGH (ref 70–99)

## 2010-12-04 LAB — POCT I-STAT 3, ART BLOOD GAS (G3+)
Acid-base deficit: 2 mmol/L (ref 0.0–2.0)
Bicarbonate: 17.8 mEq/L — ABNORMAL LOW (ref 20.0–24.0)
O2 Saturation: 100 %
O2 Saturation: 96 %
Patient temperature: 37.5
Patient temperature: 38
TCO2: 24 mmol/L (ref 0–100)
pCO2 arterial: 47.3 mmHg — ABNORMAL HIGH (ref 35.0–45.0)
pH, Arterial: 7.307 — ABNORMAL LOW (ref 7.350–7.450)
pH, Arterial: 7.358 (ref 7.350–7.450)

## 2010-12-04 LAB — BASIC METABOLIC PANEL
BUN: 12 mg/dL (ref 6–23)
BUN: 12 mg/dL (ref 6–23)
CO2: 27 mEq/L (ref 19–32)
CO2: 28 mEq/L (ref 19–32)
CO2: 29 mEq/L (ref 19–32)
Calcium: 8.6 mg/dL (ref 8.4–10.5)
Calcium: 8.6 mg/dL (ref 8.4–10.5)
Calcium: 8.7 mg/dL (ref 8.4–10.5)
Chloride: 105 mEq/L (ref 96–112)
Chloride: 106 mEq/L (ref 96–112)
Chloride: 89 mEq/L — ABNORMAL LOW (ref 96–112)
Chloride: 95 mEq/L — ABNORMAL LOW (ref 96–112)
Creatinine, Ser: 0.48 mg/dL (ref 0.4–1.5)
Creatinine, Ser: 0.55 mg/dL (ref 0.4–1.5)
Creatinine, Ser: 0.63 mg/dL (ref 0.4–1.5)
GFR calc Af Amer: >60 mL/min (ref >60)
GFR calc Af Amer: >60 mL/min (ref >60)
GFR calc Af Amer: >60 mL/min (ref >60)
GFR calc Af Amer: >60 mL/min (ref >60)
GFR calc non Af Amer: >60 mL/min (ref >60)
GFR calc non Af Amer: >60 mL/min (ref >60)
GFR calc non Af Amer: >60 mL/min (ref >60)
GFR calc non Af Amer: >60 mL/min (ref >60)
Glucose, Bld: 120 mg/dL — ABNORMAL HIGH (ref 70–99)
Glucose, Bld: 126 mg/dL — ABNORMAL HIGH (ref 70–99)
Glucose, Bld: 141 mg/dL — ABNORMAL HIGH (ref 70–99)
Potassium: 3.6 mEq/L (ref 3.5–5.1)
Potassium: 3.8 mEq/L (ref 3.5–5.1)
Potassium: 3.8 mEq/L (ref 3.5–5.1)
Potassium: 3.8 mEq/L (ref 3.5–5.1)
Potassium: 4.3 mEq/L (ref 3.5–5.1)
Sodium: 126 mEq/L — ABNORMAL LOW (ref 135–145)
Sodium: 132 mEq/L — ABNORMAL LOW (ref 135–145)
Sodium: 134 mEq/L — ABNORMAL LOW (ref 135–145)
Sodium: 138 mEq/L (ref 135–145)

## 2010-12-04 LAB — CREATININE, SERUM
Creatinine, Ser: 0.55 mg/dL (ref 0.4–1.5)
GFR calc Af Amer: >60 mL/min (ref >60)
GFR calc non Af Amer: >60 mL/min (ref >60)
GFR calc non Af Amer: >60 mL/min (ref >60)

## 2010-12-04 LAB — TYPE AND SCREEN
ABO/RH(D): O NEG
Antibody Screen: NEGATIVE

## 2010-12-04 LAB — GLUCOSE, CAPILLARY
Glucose-Capillary: 118 mg/dL — ABNORMAL HIGH (ref 70–99)
Glucose-Capillary: 125 mg/dL — ABNORMAL HIGH (ref 70–99)
Glucose-Capillary: 128 mg/dL — ABNORMAL HIGH (ref 70–99)
Glucose-Capillary: 128 mg/dL — ABNORMAL HIGH (ref 70–99)
Glucose-Capillary: 128 mg/dL — ABNORMAL HIGH (ref 70–99)
Glucose-Capillary: 153 mg/dL — ABNORMAL HIGH (ref 70–99)
Glucose-Capillary: 156 mg/dL — ABNORMAL HIGH (ref 70–99)
Glucose-Capillary: 171 mg/dL — ABNORMAL HIGH (ref 70–99)
Glucose-Capillary: 196 mg/dL — ABNORMAL HIGH (ref 70–99)
Glucose-Capillary: 198 mg/dL — ABNORMAL HIGH (ref 70–99)
Glucose-Capillary: 208 mg/dL — ABNORMAL HIGH (ref 70–99)
Glucose-Capillary: 214 mg/dL — ABNORMAL HIGH (ref 70–99)
Glucose-Capillary: 214 mg/dL — ABNORMAL HIGH (ref 70–99)
Glucose-Capillary: 226 mg/dL — ABNORMAL HIGH (ref 70–99)
Glucose-Capillary: 293 mg/dL — ABNORMAL HIGH (ref 70–99)
Glucose-Capillary: 87 mg/dL (ref 70–99)
Glucose-Capillary: 95 mg/dL (ref 70–99)

## 2010-12-04 LAB — CBC
HCT: 29.4 % — ABNORMAL LOW (ref 39.0–52.0)
HCT: 29.5 % — ABNORMAL LOW (ref 39.0–52.0)
HCT: 30.2 % — ABNORMAL LOW (ref 39.0–52.0)
HCT: 31.8 % — ABNORMAL LOW (ref 39.0–52.0)
HCT: 32.3 % — ABNORMAL LOW (ref 39.0–52.0)
Hemoglobin: 10.4 g/dL — ABNORMAL LOW (ref 13.0–17.0)
Hemoglobin: 10.6 g/dL — ABNORMAL LOW (ref 13.0–17.0)
Hemoglobin: 10.6 g/dL — ABNORMAL LOW (ref 13.0–17.0)
Hemoglobin: 11.1 g/dL — ABNORMAL LOW (ref 13.0–17.0)
MCHC: 34.8 g/dL (ref 30.0–36.0)
MCHC: 34.9 g/dL (ref 30.0–36.0)
MCHC: 35 g/dL (ref 30.0–36.0)
MCHC: 35 g/dL (ref 30.0–36.0)
MCHC: 36 g/dL (ref 30.0–36.0)
MCV: 91.8 fL (ref 78.0–100.0)
MCV: 93.2 fL (ref 78.0–100.0)
MCV: 93.8 fL (ref 78.0–100.0)
Platelets: 101 10*3/uL — ABNORMAL LOW (ref 150–400)
Platelets: 106 10*3/uL — ABNORMAL LOW (ref 150–400)
Platelets: 118 10*3/uL — ABNORMAL LOW (ref 150–400)
Platelets: 95 10*3/uL — ABNORMAL LOW (ref 150–400)
RBC: 3.2 MIL/uL — ABNORMAL LOW (ref 4.22–5.81)
RBC: 3.21 MIL/uL — ABNORMAL LOW (ref 4.22–5.81)
RBC: 3.24 MIL/uL — ABNORMAL LOW (ref 4.22–5.81)
RBC: 3.45 MIL/uL — ABNORMAL LOW (ref 4.22–5.81)
RBC: 3.71 MIL/uL — ABNORMAL LOW (ref 4.22–5.81)
RBC: 4.02 MIL/uL — ABNORMAL LOW (ref 4.22–5.81)
RDW: 14.1 % (ref 11.5–15.5)
RDW: 14.1 % (ref 11.5–15.5)
RDW: 14.3 % (ref 11.5–15.5)
RDW: 14.4 % (ref 11.5–15.5)
WBC: 10.4 10*3/uL (ref 4.0–10.5)
WBC: 11.1 10*3/uL — ABNORMAL HIGH (ref 4.0–10.5)
WBC: 12 10*3/uL — ABNORMAL HIGH (ref 4.0–10.5)
WBC: 13.3 10*3/uL — ABNORMAL HIGH (ref 4.0–10.5)
WBC: 13.6 10*3/uL — ABNORMAL HIGH (ref 4.0–10.5)
WBC: 16.1 10*3/uL — ABNORMAL HIGH (ref 4.0–10.5)
WBC: 17.1 10*3/uL — ABNORMAL HIGH (ref 4.0–10.5)

## 2010-12-04 LAB — COMPREHENSIVE METABOLIC PANEL
ALT: 19 U/L (ref 0–53)
AST: 22 U/L (ref 0–37)
CO2: 22 mEq/L (ref 19–32)
Chloride: 100 mEq/L (ref 96–112)
GFR calc Af Amer: >60 mL/min (ref >60)
GFR calc non Af Amer: >60 mL/min (ref >60)
Potassium: 4.5 mEq/L (ref 3.5–5.1)
Sodium: 130 mEq/L — ABNORMAL LOW (ref 135–145)
Total Bilirubin: 0.5 mg/dL (ref 0.3–1.2)

## 2010-12-04 LAB — ABO/RH: ABO/RH(D): O NEG

## 2010-12-04 LAB — POCT I-STAT 4, (NA,K, GLUC, HGB,HCT)
Glucose, Bld: 139 mg/dL — ABNORMAL HIGH (ref 70–99)
Glucose, Bld: 148 mg/dL — ABNORMAL HIGH (ref 70–99)
Glucose, Bld: 162 mg/dL — ABNORMAL HIGH (ref 70–99)
HCT: 25 % — ABNORMAL LOW (ref 39.0–52.0)
HCT: 27 % — ABNORMAL LOW (ref 39.0–52.0)
HCT: 33 % — ABNORMAL LOW (ref 39.0–52.0)
Hemoglobin: 10.2 g/dL — ABNORMAL LOW (ref 13.0–17.0)
Hemoglobin: 11.2 g/dL — ABNORMAL LOW (ref 13.0–17.0)
Hemoglobin: 8.5 g/dL — ABNORMAL LOW (ref 13.0–17.0)
Hemoglobin: 9.2 g/dL — ABNORMAL LOW (ref 13.0–17.0)
Potassium: 3.3 mEq/L — ABNORMAL LOW (ref 3.5–5.1)
Potassium: 4 mEq/L (ref 3.5–5.1)
Potassium: 4 mEq/L (ref 3.5–5.1)
Potassium: 4.2 mEq/L (ref 3.5–5.1)
Sodium: 132 mEq/L — ABNORMAL LOW (ref 135–145)
Sodium: 136 mEq/L (ref 135–145)

## 2010-12-04 LAB — BLOOD GAS, ARTERIAL
Acid-base deficit: 1.4 mmol/L (ref 0.0–2.0)
Drawn by: 206361
FIO2: 0.21 %
pCO2 arterial: 35.3 mmHg (ref 35.0–45.0)
pO2, Arterial: 84.1 mmHg (ref 80.0–100.0)

## 2010-12-04 LAB — POCT I-STAT, CHEM 8
Calcium, Ion: 1.19 mmol/L (ref 1.12–1.32)
Calcium, Ion: 1.22 mmol/L (ref 1.12–1.32)
Glucose, Bld: 169 mg/dL — ABNORMAL HIGH (ref 70–99)
HCT: 31 % — ABNORMAL LOW (ref 39.0–52.0)
HCT: 32 % — ABNORMAL LOW (ref 39.0–52.0)
Hemoglobin: 10.5 g/dL — ABNORMAL LOW (ref 13.0–17.0)
Potassium: 3.8 mEq/L (ref 3.5–5.1)
TCO2: 23 mmol/L (ref 0–100)
TCO2: 23 mmol/L (ref 0–100)

## 2010-12-04 LAB — MAGNESIUM
Magnesium: 2 mg/dL (ref 1.5–2.5)
Magnesium: 2.3 mg/dL (ref 1.5–2.5)

## 2010-12-04 LAB — URINALYSIS, ROUTINE W REFLEX MICROSCOPIC
Glucose, UA: 500 mg/dL — AB
Hgb urine dipstick: NEGATIVE
Specific Gravity, Urine: 1.019 (ref 1.005–1.030)
Urobilinogen, UA: 1 mg/dL (ref 0.0–1.0)

## 2010-12-04 LAB — PLATELET COUNT: Platelets: 118 10*3/uL — ABNORMAL LOW (ref 150–400)

## 2010-12-04 LAB — PROTIME-INR
INR: 1.5 (ref 0.00–1.49)
Prothrombin Time: 19.2 seconds — ABNORMAL HIGH (ref 11.6–15.2)

## 2010-12-04 LAB — APTT: aPTT: 40 seconds — ABNORMAL HIGH (ref 24–37)

## 2011-01-06 NOTE — H&P (Signed)
NAME:  Reginald Ayers, Reginald Ayers NO.:  [id number]   MEDICAL RECORD NO.:  [id number]          PATIENT TYPE:  INP   LOCATION:                               FACILITY:  [facility]   PHYSICIAN:  Salvatore Decent. [name], M.D.DATE OF BIRTH:  April 13, 1951   DATE OF ADMISSION:  11/05/2008  DATE OF DISCHARGE:                              HISTORY & PHYSICAL   CHIEF COMPLAINT:  Chest pain.   HISTORY OF PRESENT ILLNESS:  Mr. Reginald Ayers is a 60 year old gentleman sent  for consultation by Dr. Nanetta Batty for 3-vessel coronary artery  disease.   HISTORY OF PRESENT ILLNESS:  Mr. Reginald Ayers is a 60 year old gentleman with  multiple cardiac risk factors who has been experiencing exertional chest  discomfort.  He describes this as a burning sensation substernally,  usually with exertion although he has had a couple of episodes at night.  He was seen by Dr. Allyson Sabal with whom he had a previous relationship for  treatment of his peripheral disease.  A Myoview showed scar in the LAD  territory without any surrounding ischemia, but he continued to have  this burning sensation, so he underwent cardiac catheterization on  October 02, 2008, was found to have severe 3-vessel coronary disease  including a totally occluded LAD, which fills faintly via collateral,  diffuse disease in the right coronary.  He had a codominant-type  circulation.  He had significant disease in the ramus intermedius, as  well as in his circumflex distribution.  He says that he is still having  this discomfort at least on a daily basis.  He sometimes takes antacids  with relief.  He has never tried taking sublingual nitroglycerin.   His past medical history is significant for:  1. Tobacco abuse.  2. COPD.  3. Hypertension.  4. Hyperlipidemia.  5. Adult-onset type 2 non-insulin-dependent diabetes.  6. TIA in 1999.  7. Leukemia in 1997.  8. Peripheral arterial disease with known totally occluded left      carotid artery.  9. Previous iliac  stents bilaterally.   CURRENT MEDICATIONS:  1. Avapro 300 mg daily.  2. Avandia 4 mg b.i.d.  3. Glucophage 1000 mg b.i.d.  4. Plavix 75 mg daily.  5. Januvia 100 mg daily.  6. Simvastatin 40 mg daily.  7. Multivitamin, vitamin B12 monthly.  8. Spiriva 18 mcg inhaled daily.  9. Metoprolol 25 mg b.i.d.  10.Imdur 30 mg daily.  11.Glipizide 10 mg b.i.d.  12.Fluticasone 50 mcg daily.  13.Mucinex 3 times daily.  14.Ibuprofen p.r.n.   He has no known drug allergies.   FAMILY HISTORY:  Significant for hardening of the arteries in his mother  prior to age 60.   SOCIAL HISTORY:  He is single.  He lives alone.  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  He has a 50-pack year history of smoking,  currently 1 pack per day.  He had started a nicotine patch and says he  is going to quit smoking.   REVIEW OF SYSTEMS:  The patient's history form is reviewed and is on the  chart.  He does have claudication.  He circled blood clot in vein, but I  suspect he is referring to peripheral arterial disease.  He has no  history of DVT, arthritis, diarrhea, productive cough, wheezing, and  bronchitis.  No hemoptysis.  All other systems are negative.   PHYSICAL EXAMINATION:  GENERAL:  Mr. Reginald Ayers is a 60 year old white male  who appears much older than his stated age.  VITAL SIGNS:  His blood pressure is 131/80, pulse is 80, respirations  18.  His oxygen saturations is 97% on room air.  NEUROLOGIC:  He is alert and oriented x3 with no focal deficits.  HEENT:  Unremarkable.  NECK:  Supple.  He does have a right carotid bruit.  There is no bruit  on the left.  CARDIAC:  He has regular rate and rhythm.  Normal S1 and S2.  No rubs,  murmur, or gallops.  LUNGS:  Clear with faint breath sounds bilaterally.  There is no  wheezing at the present time.  ABDOMEN:  Soft and nontender.  EXTREMITIES:  He has no palpable pulses distally.  No significant  varicosities.  He has a normal Allen test in the left  arm.   LABORATORY DATA:  ABIs are 0.60 on the right and 0.66 on the left.  As  of May in 2009, carotid duplex showed totally occluded left carotid and  50-69% stenosis in the right internal carotid.  On September 18, 2008,  cardiac catheterization as previously noted.  White count is 9.2,  hematocrit is 37, platelets 211.  PT 14.6, PTT 31.  TSH normal.  Glucose  251, BUN and creatinine are 12 and 0.8, sodium 137, potassium 4.9.  Urinalysis was negative.   IMPRESSION:  Mr. Reginald Ayers is a 60 year old gentleman with severe diffuse  atherosclerotic cardiovascular disease.  He has multiple cardiac risk  factors including poorly controlled diabetes, hypertension,  hyperlipidemia, and a strong family history, as well as ongoing tobacco  abuse.  His coronary disease is far too extensive to treat with  percutaneous intervention, and given that he continues to have symptoms  despite a good medical regimen, he needs a coronary artery bypass  grafting for survival benefit, as well as relief of symptoms.  I have  discussed in detail with him the fact that this is a high risk procedure  given his comorbidities.  He understands the risks of surgery, include  but not limited to death, stroke, myocardial infarction, deep venous  thrombosis, pulmonary embolism, bleeding, possible need for  transfusions, infections, as well as other organ system dysfunction,  including respiratory, renal, or gastrointestinal complications.  Given  that he has poor peripheral circulation, he is at risk for significant  wound complications with potential limb loss.  We will try to limit the  amount of vein we need to take by using a left radial artery in addition  to the left mammary artery, he is not a candidate for bilateral  mammaries given his severe diabetes and tobacco abuse.  His long-term  outcome is likely to be limited due to the diffuse disease within his  coronary arteries and limitations to graft patency related to  outflow.  Mr. Reginald Ayers understands these issues and accepts and he seems to have  somewhat unrealistic expectations in terms of his recovery and long-term  prognosis but does understand the issues related to the surgery.   Finally, he lives alone but says he has a sister who can come stay with  him for several weeks  as he recovers.  He has been on Plavix.  We will  discontinue that after tomorrow's dose and then he will come in for  surgery on Friday, October 26, 2008.  All of the patient's questions were  answered.      Salvatore Decent [name], M.D.  Electronically Signed     SCH/MEDQ  D:  10/18/2008  T:  10/19/2008  Job:  [id number]

## 2011-01-06 NOTE — Op Note (Signed)
NAME:  Reginald Ayers, Reginald Ayers NO.:  [id number]   MEDICAL RECORD NO.:  [id number]          PATIENT TYPE:  INP   LOCATION:  2301                         FACILITY:  [facility]   PHYSICIAN:  Bedelia Person, M.D.        DATE OF BIRTH:  07-13-1951   DATE OF PROCEDURE:  10/26/2008  DATE OF DISCHARGE:                               OPERATIVE REPORT   INTRAOPERATIVE TRANSESOPHAGEAL ECHOCARDIOGRAPHY:  Reginald Ayers is a 59 year old male with a known coronary artery disease as  well as COPD, hypertension, peripheral vascular disease, diabetes, and a  past CVA.  He is scheduled at this time for coronary artery bypass  grafting and a TEE will be used intraoperatively to assess left  ventricular function as well as valvular dysfunction.  After induction  of general anesthesia, the airway was secured with an oral endotracheal  tube.  The transesophageal echocardiography transducer was heavily  lubricated, placed in a sleeve, which was also lubricated, placed  blindly down the oropharynx with no significant resistance.  The patient  had no history of esophageal or gastric pathology.  The TEE remained in  the neutral, unflexed position during the bypass.  It was removed at the  completion of the case with no evidence of oropharyngeal damage, did  obtain various multiplane views throughout the procedure.   The prebypass examination revealed the left ventricle to be normal size.  Contractility was good.  There was no segmental defects.  There was no  pericardial effusion.  The left atrium was also normal size.  The  appendage was clean.  Septum was intact.  Mitral valve leaflets coapted  well within the valvular plane.  They were thin.  No masses or  vegetations were noted.  Color Doppler revealed a trace central  regurgitant flow.  The aortic valve had three leaflets, all opened and  closed appropriately.  No calcifications were noted.  Color Doppler  revealed no aortic insufficiency.  Right heart  examination revealed the  tricuspid valve to be normal in appearance with the Swan-Ganz catheter  across the valve.  There was mild insufficiency.  No other abnormalities  were detected.  The patient underwent coronary artery bypass grafting.  At the completion of the procedure, the left ventricular function was  reassessed and noted to be dynamic on inotropic dopamine.  There were no  new segmental defects.  Contractility was dynamic as mentioned.  The  mitral valve and aortic valves were unchanged on prebypass examination  and normal in appearance and function.  No other new abnormalities were  noted.           ______________________________  Bedelia Person, M.D.     LK/MEDQ  D:  10/26/2008  T:  10/27/2008  Job:  [id number]

## 2011-01-06 NOTE — Discharge Summary (Signed)
NAME:  Reginald Ayers, Reginald Ayers NO.:  [id number]   MEDICAL RECORD NO.:  [id number]          PATIENT TYPE:  INP   LOCATION:  2023                         FACILITY:  [facility]   PHYSICIAN:  Salvatore Decent. [name], M.D.DATE OF BIRTH:  [birth year]   DATE OF ADMISSION:  10/26/2008  DATE OF DISCHARGE:                               DISCHARGE SUMMARY   PRIMARY ADMITTING DIAGNOSIS:  Coronary artery disease.   ADDITIONAL/DISCHARGE DIAGNOSES:  1. Severe three-vessel coronary artery disease.  2. Hypertension.  3. Hyperlipidemia.  4. Type 2 diabetes mellitus.  5. Chronic obstructive pulmonary disease.  6. History of transient ischemic attack.  7. Leukemia.  8. Cerebrovascular obstructive disease with occluded left carotid      artery.  9. Peripheral vascular disease status post prior iliac stent      placement.  10.Ongoing tobacco abuse.  11.Postoperative atrial fibrillation.  12.Postoperative bronchitis.   PROCEDURES PERFORMED:  1. Coronary artery bypass grafting x5 (left internal mammary artery to      the LAD, left radial artery to the first obtuse marginal, saphenous      vein graft to the ramus intermedius, sequential saphenous vein      graft to the acute marginal and the distal right coronary artery).  2. Endoscopic vein harvest right thigh and open left lower extremity      and left radial artery harvest.   HISTORY:  The patient is a 60 year old male who recently presented with  exertional chest discomfort.  He was seen by Dr. Allyson Sabal who normally  follows him for peripheral vascular disease.  He underwent a Myoview  study which showed scar in the LAD territory without surrounding  ischemia.  He underwent subsequent cardiac catheterization which showed  severe three-vessel coronary artery disease including totally occluded  LAD, diffuse disease in the right coronary and significant disease in  the ramus and circumflex distribution.  Because of his diffuse disease  and his  multiple cardiac risk factors, he was felt to be a poor  candidate for percutaneous intervention.  He was referred to Dr. Charlett Lango and was seen as an outpatient consultation regarding  possible surgical revascularization.  Dr. [name] reviewed his films  and agreed that he would benefit from CABG.  He explained all risks,  benefits, and alternatives of surgery to the patient and the patient  agreed to proceed.   HOSPITAL COURSE:  Reginald Ayers was admitted to [facility] on October 26, 2008.  He was taken to the operating room where he underwent CABG x5  performed by Dr. [name] as described above.  Please see previously  dictated operative report for complete details of surgery.  He tolerated  the procedure well and was transferred to the SICU in stable condition.  He was able to be extubated shortly after surgery.  He was  hemodynamically stable and doing well on postop day #1.  His chest tubes  and lines were removed, and pressors were weaned and discontinued.  He  was kept in the unit for further observation and pulmonary toilet.  He  did develop atrial  fibrillation and was started on IV amiodarone.  He  did convert to normal sinus rhythm, and after 24 hours of IV amiodarone  was switched to a p.o. dose.  By postop day #3, he was doing well with  off all drips and was ready for transfer to the floor.  His  postoperative course has also been notable for a productive cough with  some purulent sputum.  His white count has remained stable, although he  has had increased O2 requirements.  For this reason, he was started on  Avelox and was treated with mucolytics and incentive spirometry.  He  also has been volume overloaded and was started on Lasix to which he  responded well.  He presently is 1 kg below his preoperative weight,  although he has some persistent lower extremity edema on physical exam.  Because he has had developed some hyponatremia with sodium down to  126,  his Lasix is on hold currently.  He is otherwise doing well.  He is  afebrile, and all vital signs are stable.  His incisions are all healing  well.  He is ambulating with cardiac rehab phase one and is progressing  nicely.  He is tolerating a regular diet and is having normal bowel and  bladder function.  He has been restarted on his home diabetes  medications, and his blood sugars are well controlled.  His most recent  labs show hemoglobin of 11, hematocrit 31, white count 10.4, platelets  214.  Sodium 126, potassium 4.3, BUN 21, creatinine 0.76.  He has also  had low normal blood pressures throughout this admission with systolics  running anywhere between 80 and 100.  He has been restarted on a beta-  blocker, but his home Avapro and Imdur has been discontinued.  He will  be reevaluated on the morning of November 02, 2008.  It is anticipated that  if he is doing well and has had no other acute changes, he will  hopefully be ready for discharge home.   DISCHARGE MEDICATIONS:  1. Aspirin 325 mg daily.  2. Toprol-XL 25 mg daily.  3. Amiodarone 200 mg b.i.d.  4. Oxycodone 5 mg one to two q.4 hours p.r.n. pain.  5. Avelox 400 mg daily x5 days.  6. Avandia 4 mg b.i.d.  7. Glucophage 1000 mg b.i.d.  8. Plavix 75 mg daily.  9. Januvia 100 mg daily.  10.Simvastatin 40 mg daily.  11.Multivitamin and B12 monthly.  12.Spiriva 18 mcg daily.  13.Glipizide 10 mg b.i.d.  14.Flonase nasal spray daily.   DISCHARGE INSTRUCTIONS:  He is asked to refrain from driving, heavy  lifting, or strenuous activity.  He may continue ambulating daily and  using his incentive spirometer.  He may shower daily and clean his  incisions with soap and water.  He will continue a low-fat, low-sodium,  carb-modified diet.   DISCHARGE FOLLOWUP:  He will need to make an appointment to see Dr.  Allyson Sabal in 2 weeks.  He will then follow up with Dr. [name] in 3  weeks with a chest x-ray.  In the interim, if he  experiences any  problems or has questions, he is asked to contact our office  immediately.      Coral Ceo, P.A.      Salvatore Decent [name], M.D.  Electronically Signed    GC/MEDQ  D:  11/01/2008  T:  11/01/2008  Job:  [id number]   cc:   Theressa Millard, M.D.  Nanetta Batty, M.D.

## 2011-01-06 NOTE — Assessment & Plan Note (Signed)
OFFICE VISIT   Reginald Ayers, Reginald Ayers  DOB:  1951-02-03                                        November 30, 2008  CHART #:  [id number]   The patient is a 60 year old gentleman, who underwent coronary artery  bypass grafting x5 on October 26, 2008.  He had an uncomplicated  postoperative course and now returns for postoperative followup.  He saw  Dr. Allyson Sabal on November 16, 2008.  He says that he still has a little bit of  discomfort.  He still taking an oxycodone at night before he goes to  bed, but does not happen to take it at all during the day.  His sister  has been living with him.  He says that he is a little tired of that and  wants to be on his own.  He does not drive, but he has been fixing his  own wheels and doing some light work around in the house.  He does not  have any anginal-type symptoms.   PHYSICAL EXAMINATION:  GENERAL:  The patient is a 60 year old white male  in no acute distress.  VITAL SIGNS:  His blood pressure is 180/90, pulse 73, respirations are  18, and his oxygen saturation is 98% on room air.  LUNGS:  Clear with equal breath sounds.  CARDIAC:  Regular rate and rhythm.  Normal S1 and S2.  No murmurs, rubs,  or gallops.  CHEST:  Sternum is stable.  Sternal incision is well healed.  EXTREMITIES:  Leg incisions are well healed.  He has no peripheral  edema.   Chest x-ray shows good aeration of the lungs.  There is a minimal left  pleural effusion.   IMPRESSION:  The patient is doing well at this point in time.  He is  going to start cardiac rehab in the next couple of weeks.  I do not  think he needs to stay with him at this point in time, but he is going  to work that out with his sister.  He has been a little bit concerned  about his blood pressure which is high currently, but I think that it  maybe just because he was anxious about this living arrangement.  Certainly has not had any other episodes where his blood pressure has  been that  elevated.  He will continue to follow up with Dr. Allyson Sabal and  Dr. Earl Gala regarding his medications.  I have advised him not to lift  any heavy objects for at least another 2 weeks.  He was not driving  preoperatively, so this is not an issue.  I did encourage him to slowly  build back into his activities and strongly encouraged him to go to the  cardiac rehab program.  I would be happy to see the patient back anytime  if I can be of any further assistance with his care.   Salvatore Decent [name], M.D.  Electronically Signed   SCH/MEDQ  D:  11/30/2008  T:  12/01/2008  Job:  [id number]   cc:   Nanetta Batty, M.D.  Theressa Millard, M.D.

## 2011-01-06 NOTE — Op Note (Signed)
NAME:  Reginald Ayers, Reginald Ayers NO.:  [id number]   MEDICAL RECORD NO.:  [id number]          PATIENT TYPE:  INP   LOCATION:  2301                         FACILITY:  [facility]   PHYSICIAN:  Reginald Ayers, M.D.DATE OF BIRTH:  April 15, 1951   DATE OF PROCEDURE:  10/26/2008  DATE OF DISCHARGE:                               OPERATIVE REPORT   SURGEON:  Reginald Spare C. [name], MD   PREOPERATIVE DIAGNOSIS:  Severe 3-vessel coronary artery disease with  progressive exertional dyspnea.   POSTOPERATIVE DIAGNOSIS:  Severe 3-vessel coronary artery disease with  progressive exertional dyspnea.   PROCEDURES:  Median sternotomy, extracorporeal circulation, coronary  artery bypass grafting x5 (left internal mammary artery to left anterior  descending, left radial artery to obtuse marginal-1, saphenous vein  graft to ramus intermedius, sequential saphenous vein graft to acute  marginal and distal right coronary), endoscopic vein harvest to right  thigh, open radial and left leg vein harvest.   CLINICAL NOTE:  Reginald Ayers is a 60 year old gentleman with severe  atherosclerotic cardiovascular disease.  He presents with exertional  chest discomfort.  He has had a couple of nocturnal episodes as well.  He underwent cardiac catheterization by Reginald Ayers where he was  found to have severe 3-vessel coronary artery disease.  He was referred  for coronary artery bypass grafting and the indications, risks,  benefits, and alternatives were discussed in detail with the patient, he  understood and accepted to the risks and agreed to proceed.   OPERATIVE NOTE:  Reginald Ayers was brought to the preop holding area on October 26, 2008.  There, the Anesthesia Service under the direction of Reginald Ayers placed lines for monitoring arterial blood pressure, intravenous  access, and a Swan-Ganz catheter.  Intravenous antibiotics were  administered.  The patient was taken to the operating room,  anesthetized  and intubated.  Transesophageal echocardiography was performed, which  showed preserved left ventricular function with no significant valvular  pathology.  The chest, abdomen, legs and left arm were prepped and  draped in the usual fashion.  Incision was made over the volar aspect of  the left wrist overlying the radial pulse.  Of note, normal preoperative  Allen test has been confirmed by Doppler and pulse oximetry.  A distal  incision was made initially.  A short segment of the radial artery was  mobilized.  There was a good pulse in the radial artery distally with  proximal occlusion confirming the results of the preoperative Allen  test.  The incision then was extended to just below the antecubital  fossa.  The radial artery was harvested using the harmonic scalpel.  A  2000 units of heparin was administered during the vessel harvest.   A median sternotomy was performed and the left internal mammary artery  was harvested using standard technique, it was a good-quality vessel.  The incision for the left radial artery then was closed in 2 layers.  The arm was wrapped and then tucked to the patient's side.  The radial  was placed in the heparin saline solution.  An  incision was made in the  medial aspect of the right leg at the level of the knee.  The greater  saphenous vein was identified and was harvested endoscopically from the  right thigh, it was a bifurcated system.  It initially appeared that  would be adequate for use as a graft; however, after excising the vein  and inspecting it, it was not satisfactory for use as a bypass conduit.  A short segment of this vein in its most proximal extent and its most  distal extent were anastomosed end-to-side for the ramus graft.  The  remainder of the vein was discarded.  An incision was made in the left  groin, was carried through the skin and subcutaneous tissue.  The  greater saphenous vein was identified and traced back to  the  saphenofemoral junction, this was indeed the saphenous vein.  The  saphenous vein then was harvested through a bridged incisions from the  upper two-thirds of the left thigh.  This segment of vein was larger  caliber.  There was fair quality but was suitable for use as a graft.   The remainder of the heparin was given.  After confirming adequate  anticoagulation with ACT measurement, the pericardium was opened.  The  ascending aorta was cannulated via concentric 2-0 Ethibond pledgeted  pursestring sutures.  A dual-stage venous cannula was placed via  pursestring suture in the right atrial appendage.  Cardiopulmonary  bypass was instituted, and the patient was cooled to 32 degrees Celsius.  The coronary arteries were inspected and anastomotic sites were chosen.  The conduits were inspected and cut to length.  A foam pad was placed in  the pericardium to protect the left phrenic nerve.  A temperature probe  was placed in myocardial septum and a cardioplegia cannula was placed in  the ascending aorta.   The aorta was cross-clamped.  The left ventricle was emptied via the  aortic root vent.  Cardiac arrest then was achieved with a combination  of cold antegrade blood cardioplegia and topical iced saline.  After  achieving a complete diastolic arrest and adequate myocardial septal  cooling, the following distal anastomoses were performed.   First reverse saphenous vein graft was placed end-to-side to the ramus  intermedius.  This was a 1-mm poor quality target.  The vein graft was  anastomosed end-to-side with a running 7-0 Prolene suture.  All  anastomoses were probed proximally and distally at their completion.  Cardioplegia was administered at the completion of each vein graft.   Next, a reverse saphenous vein graft was placed sequentially to the  acute marginal branch of the right coronary and the distal right  coronary itself at the level of the takeoff of the posterior  descending,  both of these vessels had significant atherosclerotic plaque.  Both did  accept a 1.5-mm probe, both were fair-to-poor quality targets.  A side-  to-side anastomosis was performed to the acute marginal and end-to-side  to the distal right coronary.   Next, the distal end of the left radial artery was beveled and was  anastomosed end-to-side to obtuse marginal-1, this was a 1.5-mm good  quality target.  The radial was small caliber but good quality, it was  anastomosed end-to-side with a running 8-0 Prolene suture.  Additional  cardioplegia was administered down the vein graft and the aortic root.  There was good backbleeding from the radial artery.   Next, the left internal mammary artery was brought through a window in  the pericardium.  The distal end was beveled and was then anastomosed  end-to-side to the distal LAD.  The LAD was a 1.5-mm vessel, it was  intramyocardial, it was very thick walled, was very heavily diseased  distally and only a 1-mm probe would pass to the apex.  The mammary was  a good quality conduit, it was anastomosed end-to-side with a running 8-  0 Prolene suture.  At the completion of the mammary to LAD anastomosis,  bulldog clamp was briefly removed.  Myocardial rewarming was noted.  The  bulldog clamp was replaced.  Additional cardioplegia was administered.   The vein was cut to length as was the radial artery.  The proximal vein  graft anastomoses were performed to 4.0-mm punch aortotomies with  running 6-0 Prolene sutures.  The radial artery itself was too small to  anastomose directly to the aorta.  After completing the final proximal  anastomosis of the vein grafts, the patient was placed in Trendelenburg  position.  Lidocaine was administered.  The aortic root was de-aired,  the aortic cross-clamp was removed.  Total cross-clamp time was 87  minutes.   The patient initially fibrillated but spontaneously converted to sinus  rhythm and did  not require defibrillation.  Bulldog clamps were placed  proximally and distally on the vein graft to the ramus intermedius.  A  longitudinal venotomy was made in the proximal anastomosis for the  radial artery, it was placed through this venotomy with a running 7-0  Prolene suture.  At the completion of this anastomosis, it was de-aired  before removing the proximal clamp.  All proximal and distal anastomoses  then were inspected for hemostasis while the patient was being rewarmed.  Epicardial pacing wires were placed on the right ventricle and right  atrium, and the patient rewarmed to a core temperature of 37 degrees  Celsius.  A low-dose dopamine infusion at 3 mcg/kg per minute was  initiated.  The patient then weaned from cardiopulmonary bypass on the  first attempt without difficulty.  The total bypass time was 142  minutes.  Post-bypass transesophageal echocardiography revealed  preserved left ventricular function with no valvular pathology.  Initial  cardiac index was greater than 2 liters per minute per meter squared.   Test dose of protamine was administered and was well tolerated.  The  atrial and aortic cannulas were removed.  The remaining of the protamine  was administered without incident.  The chest was irrigated with 1 liter  of warm normal saline.  Hemostasis was achieved.  The pericardium was  reapproximated with interrupted 3-0 silk sutures that came together  easily without tension.  The sternum was closed with interrupted heavy  gauge stainless steel wires.  Pectoralis fascia and subcutaneous tissue  and skin were closed in standard fashion.  All sponge, needle, and  instrument counts were correct at the end of the procedure.  The patient  was transported from the operating room to the Surgical Intensive Care  Unit in good condition.      Reginald Decent Dorris Ayers, M.D.  Electronically Signed     SCH/MEDQ  D:  10/26/2008  T:  10/27/2008  Job:  [id number]   cc:    Nanetta Ayers, M.D.  Theressa Millard, M.D.

## 2011-01-09 NOTE — Cardiovascular Report (Signed)
NAME:  Reginald Ayers, HAL[place]URTON NO.:  [id number]   MEDICAL RECORD NO.:  [id number]          PATIENT TYPE:  AMB   LOCATION:  SDS                          FACILITY:  [facility]   PHYSICIAN:  Nanetta Batty, M.D.   DATE OF BIRTH:  May 24, 1951   DATE OF PROCEDURE:  12/06/2006  DATE OF DISCHARGE:                            CARDIAC CATHETERIZATION   Mr. Decandia is a 60 year old white male, history of PVOD status post left  common iliac artery PTI and stenting August 18, 2002.  He had moderate  segmental SFA disease bilaterally.  He continues to smoke, and has  hypertension, hyperlipidemia, and non-insulin-requiring diabetes.  Followup Dopplers revealed an occluded left SFA with progression of  disease in the right with bilateral functionally limiting lower  extremity claudication.  He presents now for angiography and potential  intervention for relief of these symptoms.   DESCRIPTION OF PROCEDURE:  The patient brought to the second floor [facility] angiographic suite in the postabsorptive state.  His left groin  was prepped and shaved in the usual sterile fashion.  Xylocaine 1% was  used for local anesthesia.  A 5-French sheath was inserted into the left  femoral artery using standard Seldinger technique.  A 5-French tennis  racket catheter was used for midstream distal abdominal aortography with  runoff using bolus-chase step-table digital subtraction technique.  Visipaque dye was used for the entirety of the case.  Retrograde aortic  pressures monitored during the case.   ANGIOGRAPHIC RESULTS:  1. Abdominal aorta:      a.     Renal arteries - normal.      b.     Infrarenal abdominal aorta - normal.  2. Left lower extremity:      a.     Patent proximal left common iliac artery stent.      b.     Occluded SFA at is origin with reconstitution of the above-       the-knee popliteal by profunda femoris collaterals.  There was two-       vessel runoff with an occluded anterior  tibial.  3. Right lower extremity:  90% diffuse segmental mid right SFA      stenosis with three-vessel runoff.  The anterior and posterior      tibialis were diffusely diseased but the peroneal was the dominant      vessel.   PROCEDURE DESCRIPTION:  Contralateral access was obtained with a 5-  [place] crossover catheter, .035 Wholey wire, and 7-French terminal  crossover sheath.  The patient received 3000 units of heparin  intravenously.  The mid right SFA was angioplastied with a 4 x 8  Powerflex and stented with a 6 x 12 Protege Nitinol self-expanding stent  x 83.  This was post dilated with a 5 x 10 Powerflex resulting in  reduction of 90% long diffuse segmental mid-right SFA stenosis to 0%  residual with excellent flow and no dissection.  The trifurcation  remained intact.   The sheath was withdrawn back over the iliac bifurcation into the left  iliac, ACT was measured and the  sheath was removed.  Pressure was held  on the groin to achieve hemostasis.  The patient left the laboratory in  stable condition.  Plans will be to hydrate him overnight.  He will be treated with aspirin  and Plavix and discharged home in the morning.  He will get followup  Dopplers and ABIs prior to seeing me back in the office for followup.  He will need a left fem-pop bypass graft operation for complete  revascularization.  He left the laboratory in stable condition.      Nanetta Batty, M.D.  Electronically Signed     JB/MEDQ  D:  12/06/2006  T:  12/06/2006  Job:  [id number]   cc:   2nd floor [facility] angio. suite  Theressa Millard, M.D.

## 2012-07-07 ENCOUNTER — Other Ambulatory Visit (HOSPITAL_COMMUNITY): Payer: Self-pay | Admitting: *Deleted

## 2012-07-07 DIAGNOSIS — I6529 Occlusion and stenosis of unspecified carotid artery: Secondary | ICD-10-CM

## 2012-07-28 ENCOUNTER — Encounter (HOSPITAL_COMMUNITY): Payer: Self-pay

## 2012-08-15 ENCOUNTER — Encounter (HOSPITAL_COMMUNITY): Payer: Self-pay

## 2012-08-24 HISTORY — PX: CAROTID ANGIOGRAM: SHX5765

## 2012-09-01 ENCOUNTER — Ambulatory Visit (HOSPITAL_COMMUNITY)
Admission: RE | Admit: 2012-09-01 | Discharge: 2012-09-01 | Disposition: A | Discharge status: Home / Self Care | Payer: Federal, State, Local not specified - PPO | Source: Ambulatory Visit | Attending: Cardiovascular Disease | Admitting: Cardiovascular Disease

## 2012-09-01 DIAGNOSIS — I6529 Occlusion and stenosis of unspecified carotid artery: Secondary | ICD-10-CM

## 2012-09-01 NOTE — Progress Notes (Signed)
Carotid duplex completed. Keina Mutch D  

## 2013-08-02 ENCOUNTER — Telehealth (HOSPITAL_COMMUNITY): Payer: Self-pay | Admitting: *Deleted

## 2013-08-03 ENCOUNTER — Other Ambulatory Visit (HOSPITAL_COMMUNITY): Payer: Self-pay | Admitting: Cardiovascular Disease

## 2013-08-03 DIAGNOSIS — I6529 Occlusion and stenosis of unspecified carotid artery: Secondary | ICD-10-CM

## 2013-08-09 ENCOUNTER — Ambulatory Visit (HOSPITAL_COMMUNITY)
Admission: RE | Admit: 2013-08-09 | Discharge: 2013-08-09 | Disposition: A | Discharge status: Home / Self Care | Payer: Federal, State, Local not specified - PPO | Source: Ambulatory Visit | Attending: Cardiovascular Disease | Admitting: Cardiovascular Disease

## 2013-08-09 DIAGNOSIS — I672 Cerebral atherosclerosis: Secondary | ICD-10-CM | POA: Insufficient documentation

## 2013-08-09 DIAGNOSIS — I6529 Occlusion and stenosis of unspecified carotid artery: Secondary | ICD-10-CM

## 2013-08-09 NOTE — Progress Notes (Signed)
Carotid Duplex Completed. °Brianna L Mazza,RVT °

## 2013-08-21 ENCOUNTER — Encounter: Payer: Self-pay | Admitting: Cardiovascular Disease

## 2013-08-21 ENCOUNTER — Ambulatory Visit (INDEPENDENT_AMBULATORY_CARE_PROVIDER_SITE_OTHER): Payer: Federal, State, Local not specified - PPO | Admitting: Cardiovascular Disease

## 2013-08-21 ENCOUNTER — Telehealth: Payer: Self-pay | Admitting: *Deleted

## 2013-08-21 VITALS — BP 152/62 | HR 102 | Ht 61.0 in | Wt 142.0 lb

## 2013-08-21 DIAGNOSIS — I6529 Occlusion and stenosis of unspecified carotid artery: Secondary | ICD-10-CM

## 2013-08-21 DIAGNOSIS — I2510 Atherosclerotic heart disease of native coronary artery without angina pectoris: Secondary | ICD-10-CM

## 2013-08-21 DIAGNOSIS — E119 Type 2 diabetes mellitus without complications: Secondary | ICD-10-CM

## 2013-08-21 DIAGNOSIS — E785 Hyperlipidemia, unspecified: Secondary | ICD-10-CM | POA: Insufficient documentation

## 2013-08-21 DIAGNOSIS — I779 Disorder of arteries and arterioles, unspecified: Secondary | ICD-10-CM

## 2013-08-21 DIAGNOSIS — Z79899 Other long term (current) drug therapy: Secondary | ICD-10-CM

## 2013-08-21 DIAGNOSIS — I739 Peripheral vascular disease, unspecified: Secondary | ICD-10-CM

## 2013-08-21 DIAGNOSIS — I10 Essential (primary) hypertension: Secondary | ICD-10-CM

## 2013-08-21 DIAGNOSIS — Z951 Presence of aortocoronary bypass graft: Secondary | ICD-10-CM | POA: Insufficient documentation

## 2013-08-21 NOTE — Assessment & Plan Note (Signed)
On statin therapy. We will recheck a lipid and liver profile 

## 2013-08-21 NOTE — Progress Notes (Signed)
08/21/2013 Reginald Ayers   [birth year]  [id number]  Primary Physician No primary provider on file. Primary Cardiologist: Reginald Gess MD Reginald Ayers   HPI:  The patient is a 62 year old, thin-appearing, single Caucasian male with no children whom I last saw 9 months ago. He has a history of CAD and PAD. I stented his proximal left common iliac artery and right SFA in April of 2008. He does have significant infrapopliteal disease bilaterally and known occluded left _____SA. He also has an occluded right SFA by Dopplers in our office last September. His ABIs were 0.48 on the right and 0.52 on the left, but he really denies claudication. He has known carotid disease with occluded left internal carotid artery and moderate right ICA stenosis, which has remained ultrasonographically stable. He is neurologically asymptomatic. His other problems include diabetes, hypertension, hyperlipidemia, and long-term tobacco abuse, smoking one-half pack per day. He had coronary artery bypass grafting by Dr. Andrey Ayers in February of 2010 with LIMA to his LAD, left radial to the first obtuse marginal branch, vein to ramus intermedius, and a sequential vein to an acute marginal and distal right coronary artery. Myoview performed in May of 2010 was nonischemic and showed apical scar consistent with his wall motion abnormality and his known occluded LAD. His last lipid profile in January 2 years ago  performed by Dr. [name] revealed a total cholesterol of 149, LDL of 82, and HDL of 39. Since I saw him back in the office 01/29/12 he denies chest pain, shortness of breath or claudication.    Current Outpatient Prescriptions  Medication Sig Dispense Refill  . aspirin 81 MG tablet Take 81 mg by mouth daily.      . clopidogrel (PLAVIX) 75 MG tablet Take 75 mg by mouth once.       . Cyanocobalamin (VITAMIN B-12 IJ) Inject as directed every 30 (thirty) days.      Marland Kitchen glipiZIDE (GLUCOTROL XL) 10 MG 24  hr tablet Take 10 mg by mouth 2 (two) times daily.       . irbesartan (AVAPRO) 300 MG tablet Take 300 mg by mouth daily.       Marland Kitchen JANUVIA 100 MG tablet Take 100 mg by mouth daily.       . metFORMIN (GLUCOPHAGE) 500 MG tablet Take 1,000 mg by mouth 2 (two) times daily with a meal.       . pioglitazone (ACTOS) 30 MG tablet Take 30 mg by mouth daily.       Marland Kitchen PROAIR HFA 108 (90 BASE) MCG/ACT inhaler Inhale 1 puff into the lungs every 4 (four) hours as needed.       . simvastatin (ZOCOR) 40 MG tablet Take 40 mg by mouth daily.      Marland Kitchen SPIRIVA HANDIHALER 18 MCG inhalation capsule Place 18 mcg into inhaler and inhale daily.       . traMADol (ULTRAM) 50 MG tablet Take 50 mg by mouth as needed.        No current facility-administered medications for this visit.    No Known Allergies  History   Social History  . Marital Status: Single    Spouse Name: N/A    Number of Children: N/A  . Years of Education: N/A   Occupational History  . Not on file.   Social History Main Topics  . Smoking status: Current Some Day Smoker  . Smokeless tobacco: Current User    Types: Chew  . Alcohol Use:  Not on file  . Drug Use: Not on file  . Sexual Activity: Not on file   Other Topics Concern  . Not on file   Social History Narrative  . No narrative on file     Review of Systems: General: negative for chills, fever, night sweats or weight changes.  Cardiovascular: negative for chest pain, dyspnea on exertion, edema, orthopnea, palpitations, paroxysmal nocturnal dyspnea or shortness of breath Dermatological: negative for rash Respiratory: negative for cough or wheezing Urologic: negative for hematuria Abdominal: negative for nausea, vomiting, diarrhea, bright red blood per rectum, melena, or hematemesis Neurologic: negative for visual changes, syncope, or dizziness All other systems reviewed and are otherwise negative except as noted above.    Blood pressure 152/62, pulse 102, height 5\' 1"  (1.549  m), weight 142 lb (64.411 kg).  General appearance: alert and no distress Neck: no adenopathy, no carotid bruit, no JVD, supple, symmetrical, trachea midline and thyroid not enlarged, symmetric, no tenderness/mass/nodules Lungs: clear to auscultation bilaterally Heart: regular rate and rhythm, S1, S2 normal, no murmur, click, rub or gallop Extremities: extremities normal, atraumatic, no cyanosis or edema  EKG sinus tachycardia 102 without ST or T wave changes.  ASSESSMENT AND PLAN:   Carotid artery disease Known occlusion of left internal carotid artery with moderate right ICA stenosis unchanged from prior study. We'll continue to follow noninvasively. He is on aspirin Plavix and is neurologically asymptomatic.  Coronary artery disease Status post coronary artery bypass grafting February 2010 by Dr. Andrey Ayers with a LIMA to his LAD, left radial to the first obtuse marginal branch, vein to ramus branch and sequential vein to acute marginal and distal right coronary artery. A stress Myoview performed May 2000 and was nonischemic with apical scar consistent with his known occluded LAD. He is diabetic and has been for years since his last functional study. We'll repeat a pharmacologic Myoview stress test to assess the status of his grafts.  Essential hypertension Controlled on current medications  Hyperlipidemia On statin therapy. We will recheck a lipid and liver profile      Reginald Gess MD [facility], [facility] 08/21/2013 4:06 PM

## 2013-08-21 NOTE — Assessment & Plan Note (Signed)
Known occlusion of left internal carotid artery with moderate right ICA stenosis unchanged from prior study. We'll continue to follow noninvasively. He is on aspirin Plavix and is neurologically asymptomatic.

## 2013-08-21 NOTE — Assessment & Plan Note (Signed)
Controlled on current medications 

## 2013-08-21 NOTE — Patient Instructions (Signed)
Dr Allyson Sabal has ordered:   1. Lexiscan Myoview Your physician has requested that you have a lexiscan myoview. For further information please visit [email or web address] Please follow instruction sheet, as given.   2. Blood work to be done fasting  3. Lower extremity arterial dopplers   Follow up with Dr Allyson Sabal in 1 year.

## 2013-08-21 NOTE — Assessment & Plan Note (Signed)
Status post coronary artery bypass grafting February 2010 by Dr. Andrey Spearman with a LIMA to his LAD, left radial to the first obtuse marginal branch, vein to ramus branch and sequential vein to acute marginal and distal right coronary artery. A stress Myoview performed May 2000 and was nonischemic with apical scar consistent with his known occluded LAD. He is diabetic and has been for years since his last functional study. We'll repeat a pharmacologic Myoview stress test to assess the status of his grafts.

## 2013-08-21 NOTE — Telephone Encounter (Signed)
Order placed for repeat carotid dopplers in 6 months  

## 2013-08-21 NOTE — Telephone Encounter (Signed)
Message copied by Marella Bile on Mon Aug 21, 2013  3:00 PM ------      Message from: Runell Gess      Created: Mon Aug 14, 2013  3:40 PM       No change from prior study. Repeat in 6 months ------

## 2013-08-24 HISTORY — PX: NM MYOVIEW LTD: HXRAD82

## 2013-08-29 ENCOUNTER — Ambulatory Visit (HOSPITAL_COMMUNITY)
Admission: RE | Admit: 2013-08-29 | Discharge: 2013-08-29 | Disposition: A | Discharge status: Home / Self Care | Payer: Federal, State, Local not specified - PPO | Source: Ambulatory Visit | Attending: Cardiovascular Disease | Admitting: Cardiovascular Disease

## 2013-08-29 DIAGNOSIS — I2510 Atherosclerotic heart disease of native coronary artery without angina pectoris: Secondary | ICD-10-CM | POA: Insufficient documentation

## 2013-08-29 MED ORDER — REGADENOSON 0.4 MG/5ML IV SOLN
0.4000 mg | Freq: Once | INTRAVENOUS | Status: AC
  Administered 2013-08-29: 0.4 mg via INTRAVENOUS

## 2013-08-29 MED ORDER — TECHNETIUM TC 99M SESTAMIBI GENERIC - CARDIOLITE
10.0000 | Freq: Once | INTRAVENOUS | Status: AC | PRN
  Administered 2013-08-29: 10 via INTRAVENOUS

## 2013-08-29 MED ORDER — TECHNETIUM TC 99M SESTAMIBI GENERIC - CARDIOLITE
30.8000 | Freq: Once | INTRAVENOUS | Status: AC | PRN
  Administered 2013-08-29: 30.8 via INTRAVENOUS

## 2013-08-29 NOTE — Procedures (Addendum)
[facility] [address] [place] [address] [phone]  Cardiology Nuclear Med Study  Bertram GalaWilliam H Burleigh is a 63 y.o. male     MRN : [id number]     DOB: 05/20/1951  Procedure Date: 08/29/2013  Nuclear Med Background Indication for Stress Test:  Graft Patency and Stent Patency History:  COPD and CAD;CABG X5--2010 Cardiac Risk Factors: Carotid Disease, Family History - CAD, Hypertension, Lipids, NIDDM, PVD, Smoker and TIA  Symptoms:  Chest Pain, DOE and SOB   Nuclear Pre-Procedure Caffeine/Decaff Intake:  12:00am NPO After: 10AM   IV Site: R Hand  IV 0.9% NS with Angio Cath:  22g  Chest Size (in):  40"  IV Started by: Emmit PomfretAmanda Hicks, RN  Height: 5\' 1"  (1.549 m)  Cup Size: n/a  BMI:  Body mass index is 26.84 kg/(m^2). Weight:  142 lb (64.411 kg)   Tech Comments:  N/A    Nuclear Med Study 1 or 2 day study: 1 day  Stress Test Type:  Lexiscan  Order Authorizing Provider:  Nanetta BattyJonathan Novalyn Lajara, MD   Resting Radionuclide: Technetium 1970m Sestamibi  Resting Radionuclide Dose: 10.0 mCi   Stress Radionuclide:  Technetium 8470m Sestamibi  Stress Radionuclide Dose: 30.8 mCi           Stress Protocol Rest HR: 83 Stress HR: 102  Rest BP: 151/75 Stress BP: 165/68  Exercise Time (min): n/a METS: n/a          Dose of Adenosine (mg):  n/a Dose of Lexiscan: 0.4 mg  Dose of Atropine (mg): n/a Dose of Dobutamine: n/a mcg/kg/min (at max HR)  Stress Test Technologist: Ernestene MentionGwen Farrington, CCT Nuclear Technologist: Gonzella LexPam Phillips, CNMT   Rest Procedure:  Myocardial perfusion imaging was performed at rest 45 minutes following the intravenous administration of Technetium 8070m Sestamibi. Stress Procedure:  The patient received IV Lexiscan 0.4 mg over 15-seconds.  Technetium 6270m Sestamibi injected at 30-seconds.  There were no significant changes with Lexiscan.  Quantitative spect images were obtained after a 45 minute delay.  Transient Ischemic  Dilatation (Normal <1.22):  1.09 Lung/Heart Ratio (Normal <0.45):  0.27 QGS EDV:  60 ml QGS ESV:  26 ml LV Ejection Fraction: 55%  Signed by       Rest ECG: NSR - Normal EKG  Stress ECG: No significant change from baseline ECG  QPS Raw Data Images:  Normal; no motion artifact; normal heart/lung ratio. Stress Images:  There is decreased uptake in the apex. Rest Images:  There is decreased uptake in the apex. Subtraction (SDS):  No evidence of ischemia.  Impression Exercise Capacity:  Lexiscan with no exercise. BP Response:  Normal blood pressure response. Clinical Symptoms:  No significant symptoms noted. ECG Impression:  No significant ST segment change suggestive of ischemia. Comparison with Prior Nuclear Study: No significant change from previous study  Overall Impression:  Low risk stress nuclear study Apical scar without ischemia.  LV Wall Motion:  NL LV Function; NL Wall Motion   Runell GessBERRY,Ambreen Tufte J, MD  08/29/2013 5:23 PM

## 2013-08-30 ENCOUNTER — Ambulatory Visit (HOSPITAL_COMMUNITY)
Admission: RE | Admit: 2013-08-30 | Discharge: 2013-08-30 | Disposition: A | Discharge status: Home / Self Care | Payer: Federal, State, Local not specified - PPO | Source: Ambulatory Visit | Attending: Cardiovascular Disease | Admitting: Cardiovascular Disease

## 2013-08-30 ENCOUNTER — Telehealth (HOSPITAL_COMMUNITY): Payer: Self-pay | Admitting: *Deleted

## 2013-08-30 DIAGNOSIS — I739 Peripheral vascular disease, unspecified: Secondary | ICD-10-CM

## 2013-08-30 DIAGNOSIS — I70219 Atherosclerosis of native arteries of extremities with intermittent claudication, unspecified extremity: Secondary | ICD-10-CM | POA: Insufficient documentation

## 2013-08-30 NOTE — Progress Notes (Signed)
Arterial Duplex Lower Ext. Completed. Panayiota Larkin, BS, RDMS, RVT  

## 2013-12-14 ENCOUNTER — Encounter: Payer: Self-pay | Admitting: Cardiovascular Disease

## 2013-12-29 DIAGNOSIS — D510 Vitamin B12 deficiency anemia due to intrinsic factor deficiency: Secondary | ICD-10-CM | POA: Diagnosis not present

## 2014-01-24 ENCOUNTER — Telehealth (HOSPITAL_COMMUNITY): Payer: Self-pay | Admitting: *Deleted

## 2014-01-24 DIAGNOSIS — I739 Peripheral vascular disease, unspecified: Secondary | ICD-10-CM

## 2014-01-24 NOTE — Telephone Encounter (Signed)
I called to schedule the patients Q6 carotid doppler and he mentioned that he would like his legs checked as well because they feel bad.  Please call

## 2014-01-24 NOTE — Telephone Encounter (Signed)
RN called patient. He c/o leg pain (L>R) x 2 months. The pain lasts all day and is described as sharp pain from hip all the way down. He also describes a burning feel in left leg and right lower leg. He has been taking tramadol and ibuprofen 800mg  q8h which has provided some relief. He reports that this problem came on suddenly (he woke up one morning with this issue). Reports feeling "bad all over"  Patient was contacted to set up carotid doppler study and would like to see if he can get a lower extremity arterial doppler ON SAME DAY. His last doppler was Jan 2015 - of which he stated he DID NOT GET RESULTS. He was upset that results had not been released to MyChart as well.   Will defer to Dr. Allyson Sabal to advise on ordering LEAs CC Samara Deist, RN and [place] (scheduling)

## 2014-01-27 NOTE — Telephone Encounter (Signed)
We can repeat his LEAs and arrange for him to see me back in the office

## 2014-01-29 DIAGNOSIS — R0609 Other forms of dyspnea: Secondary | ICD-10-CM | POA: Diagnosis not present

## 2014-01-29 DIAGNOSIS — D649 Anemia, unspecified: Secondary | ICD-10-CM | POA: Diagnosis not present

## 2014-01-29 DIAGNOSIS — R0989 Other specified symptoms and signs involving the circulatory and respiratory systems: Secondary | ICD-10-CM | POA: Diagnosis not present

## 2014-01-29 DIAGNOSIS — R609 Edema, unspecified: Secondary | ICD-10-CM | POA: Diagnosis not present

## 2014-01-29 NOTE — Telephone Encounter (Signed)
Appt has been made for his tests on 01-29-14.  He has a followup with Dr. Allyson Sabal on 03-13-14, which is the first opening available.

## 2014-01-29 NOTE — Telephone Encounter (Signed)
Test ordered. Patient states he prefers PM doppler appmt and would like on same day as carotid doppler. Patient notified of test being ordered and that Dr. Allyson Sabal would like him to f/up after testing. Patient voiced understanding.

## 2014-01-31 ENCOUNTER — Ambulatory Visit (HOSPITAL_COMMUNITY)
Admission: RE | Admit: 2014-01-31 | Discharge: 2014-01-31 | Disposition: A | Discharge status: Home / Self Care | Payer: Medicare Other | Source: Ambulatory Visit | Attending: Cardiovascular Disease | Admitting: Cardiovascular Disease

## 2014-01-31 ENCOUNTER — Ambulatory Visit (HOSPITAL_BASED_OUTPATIENT_CLINIC_OR_DEPARTMENT_OTHER)
Admission: RE | Admit: 2014-01-31 | Discharge: 2014-01-31 | Disposition: A | Discharge status: Home / Self Care | Payer: Medicare Other | Source: Ambulatory Visit | Attending: Cardiovascular Disease | Admitting: Cardiovascular Disease

## 2014-01-31 DIAGNOSIS — I779 Disorder of arteries and arterioles, unspecified: Secondary | ICD-10-CM

## 2014-01-31 DIAGNOSIS — I739 Peripheral vascular disease, unspecified: Secondary | ICD-10-CM

## 2014-01-31 DIAGNOSIS — I6529 Occlusion and stenosis of unspecified carotid artery: Secondary | ICD-10-CM

## 2014-01-31 NOTE — Progress Notes (Signed)
Carotid Duplex Completed. Adana Marik, BS, RDMS, RVT  

## 2014-01-31 NOTE — Progress Notes (Signed)
Lower Extremity Arterial Duplex Completed. °Brianna L Mazza,RVT °

## 2014-02-02 ENCOUNTER — Telehealth: Payer: Self-pay | Admitting: Cardiovascular Disease

## 2014-02-02 DIAGNOSIS — R0602 Shortness of breath: Secondary | ICD-10-CM

## 2014-02-02 DIAGNOSIS — D510 Vitamin B12 deficiency anemia due to intrinsic factor deficiency: Secondary | ICD-10-CM | POA: Diagnosis not present

## 2014-02-02 DIAGNOSIS — R609 Edema, unspecified: Secondary | ICD-10-CM | POA: Diagnosis not present

## 2014-02-02 NOTE — Telephone Encounter (Signed)
Order placed for echocardiogram 

## 2014-02-02 NOTE — Telephone Encounter (Signed)
Dr. Earl Galasborne calling regarding this patient.

## 2014-02-02 NOTE — Telephone Encounter (Signed)
Dr Allyson SabalBerry spoke with Dr Earl Galasborne.  Mr Reginald RaringRuss is having some SOB.  Per Dr Allyson SabalBerry proceed with an echocardiogram and a return office visit with Dr Allyson SabalBerry.

## 2014-02-03 NOTE — Telephone Encounter (Signed)
Closed encounter °

## 2014-02-06 ENCOUNTER — Telehealth: Payer: Self-pay | Admitting: *Deleted

## 2014-02-06 DIAGNOSIS — I739 Peripheral vascular disease, unspecified: Secondary | ICD-10-CM

## 2014-02-06 NOTE — Telephone Encounter (Signed)
Order placed for repeat lower extremity arterial doppler in 1 year  

## 2014-02-06 NOTE — Telephone Encounter (Signed)
Message copied by Marella BileVOGEL, KATHRYN W. on Tue Feb 06, 2014  1:04 PM ------      Message from: Runell GessBERRY, JONATHAN J      Created: Sat Feb 03, 2014  4:12 PM       No change from prior study. Repeat in 12 months. ------

## 2014-02-08 DIAGNOSIS — D649 Anemia, unspecified: Secondary | ICD-10-CM | POA: Diagnosis not present

## 2014-02-08 DIAGNOSIS — R609 Edema, unspecified: Secondary | ICD-10-CM | POA: Diagnosis not present

## 2014-02-15 ENCOUNTER — Ambulatory Visit (HOSPITAL_COMMUNITY)
Admission: RE | Admit: 2014-02-15 | Discharge: 2014-02-15 | Disposition: A | Discharge status: Home / Self Care | Payer: Medicare Other | Source: Ambulatory Visit | Attending: Cardiovascular Disease | Admitting: Cardiovascular Disease

## 2014-02-15 DIAGNOSIS — R0602 Shortness of breath: Secondary | ICD-10-CM | POA: Insufficient documentation

## 2014-02-15 DIAGNOSIS — I519 Heart disease, unspecified: Secondary | ICD-10-CM

## 2014-02-15 NOTE — Progress Notes (Addendum)
2D Echocardiogram Complete.  02/15/2014   Farrel ConnersBethany McMahill, RDCS  [name] preferred not to use contrast with his echo images at this time.

## 2014-02-20 DIAGNOSIS — H18419 Arcus senilis, unspecified eye: Secondary | ICD-10-CM | POA: Diagnosis not present

## 2014-02-20 DIAGNOSIS — E119 Type 2 diabetes mellitus without complications: Secondary | ICD-10-CM | POA: Diagnosis not present

## 2014-02-20 DIAGNOSIS — H02409 Unspecified ptosis of unspecified eyelid: Secondary | ICD-10-CM | POA: Diagnosis not present

## 2014-02-21 ENCOUNTER — Encounter: Payer: Self-pay | Admitting: Cardiovascular Disease

## 2014-02-21 ENCOUNTER — Ambulatory Visit (INDEPENDENT_AMBULATORY_CARE_PROVIDER_SITE_OTHER): Payer: Medicare Other | Admitting: Cardiovascular Disease

## 2014-02-21 VITALS — BP 154/70 | HR 102 | Ht 61.0 in | Wt 130.0 lb

## 2014-02-21 DIAGNOSIS — I779 Disorder of arteries and arterioles, unspecified: Secondary | ICD-10-CM

## 2014-02-21 DIAGNOSIS — I739 Peripheral vascular disease, unspecified: Secondary | ICD-10-CM

## 2014-02-21 DIAGNOSIS — I10 Essential (primary) hypertension: Secondary | ICD-10-CM

## 2014-02-21 DIAGNOSIS — I2583 Coronary atherosclerosis due to lipid rich plaque: Secondary | ICD-10-CM

## 2014-02-21 DIAGNOSIS — I2510 Atherosclerotic heart disease of native coronary artery without angina pectoris: Secondary | ICD-10-CM | POA: Diagnosis not present

## 2014-02-21 DIAGNOSIS — I6529 Occlusion and stenosis of unspecified carotid artery: Secondary | ICD-10-CM | POA: Diagnosis not present

## 2014-02-21 DIAGNOSIS — E785 Hyperlipidemia, unspecified: Secondary | ICD-10-CM

## 2014-02-21 NOTE — Assessment & Plan Note (Signed)
On statin therapy followed by his PCP 

## 2014-02-21 NOTE — Assessment & Plan Note (Signed)
Known occluded left internal carotid artery with moderate right ICA stenosis that we thought a duplex ultrasound. This was recently checked 01/31/14 and was without change. He is on aspirin Plavix and is neurologically asymptomatic.

## 2014-02-21 NOTE — Patient Instructions (Signed)
Your physician wants you to follow-up in: 6 months with Dr Berry. You will receive a reminder letter in the mail two months in advance. If you don't receive a letter, please call our office to schedule the follow-up appointment.  

## 2014-02-21 NOTE — Progress Notes (Signed)
02/21/2014 Reginald Ayers   October 05, 1950  [id number]  Primary Physician Darnelle BosSBORNE,JAMES CHARLES, MD Primary Cardiologist: Runell GessJonathan J. Carmelia Tiner MD Roseanne RenoFACP,FACC,FAHA, FSCAI   HPI:  The patient is a 63 year old, thin-appearing, single Caucasian male with no children whom I last saw 9 months ago. He has a history of CAD and PAD. I stented his proximal left common iliac artery and right SFA in April of 2008. He does have significant infrapopliteal disease bilaterally and known occluded left SFA. He also has an occluded right SFA by Dopplers in our office last September was earlier this month.. His ABIs were 0.48 on the right and 0.52 on the left, but he really denies claudication. He has known carotid disease with occluded left internal carotid artery and moderate right ICA stenosis, which has remained ultrasonographically stable. He is neurologically asymptomatic. His other problems include diabetes, hypertension, hyperlipidemia, and long-term tobacco abuse, smoking one-half pack per day. He had coronary artery bypass grafting by Dr. Andrey SpearmanSteve Hendrickson in February of 2010 with LIMA to his LAD, left radial to the first obtuse marginal branch, vein to ramus intermedius, and a sequential vein to an acute marginal and distal right coronary artery. Myoview performed in May of 2010 was nonischemic and showed apical scar consistent with his wall motion abnormality and his known occluded LAD.Dr. Earl Galasborne follows his lipid profile. Since I saw him back in 08/21/13 he is remained stable. He did have an episode of bilateral lower extremity edema for unclear reasons which improved with oral diuretics. He has mild claudication but denies chest pain or shortness of breath. He has stopped smoking one month ago.    Current Outpatient Prescriptions  Medication Sig Dispense Refill  . aspirin 81 MG tablet Take 81 mg by mouth daily.      . clopidogrel (PLAVIX) 75 MG tablet Take 75 mg by mouth once.       . Cyanocobalamin (VITAMIN  B-12 IJ) Inject as directed every 30 (thirty) days.      Marland Kitchen. glipiZIDE (GLUCOTROL XL) 10 MG 24 hr tablet Take 10 mg by mouth 2 (two) times daily.       . irbesartan (AVAPRO) 300 MG tablet Take 300 mg by mouth daily.       Marland Kitchen. JANUVIA 100 MG tablet Take 100 mg by mouth daily.       . metFORMIN (GLUCOPHAGE) 500 MG tablet Take 1,000 mg by mouth 2 (two) times daily with a meal.       . pioglitazone (ACTOS) 30 MG tablet Take 30 mg by mouth daily.       Marland Kitchen. PROAIR HFA 108 (90 BASE) MCG/ACT inhaler Inhale 2 puffs into the lungs every 4 (four) hours as needed.       . simvastatin (ZOCOR) 40 MG tablet Take 40 mg by mouth daily.      Marland Kitchen. SPIRIVA HANDIHALER 18 MCG inhalation capsule Place 18 mcg into inhaler and inhale daily.       . traMADol (ULTRAM) 50 MG tablet Take 50 mg by mouth 3 (three) times daily.        No current facility-administered medications for this visit.    No Known Allergies  History   Social History  . Marital Status: Single    Spouse Name: N/A    Number of Children: N/A  . Years of Education: N/A   Occupational History  . Not on file.   Social History Main Topics  . Smoking status: Former Smoker    Quit date:  01/22/2014  . Smokeless tobacco: Current User    Types: Chew  . Alcohol Use: Not on file  . Drug Use: Not on file  . Sexual Activity: Not on file   Other Topics Concern  . Not on file   Social History Narrative  . No narrative on file     Review of Systems: General: negative for chills, fever, night sweats or weight changes.  Cardiovascular: negative for chest pain, dyspnea on exertion, edema, orthopnea, palpitations, paroxysmal nocturnal dyspnea or shortness of breath Dermatological: negative for rash Respiratory: negative for cough or wheezing Urologic: negative for hematuria Abdominal: negative for nausea, vomiting, diarrhea, bright red blood per rectum, melena, or hematemesis Neurologic: negative for visual changes, syncope, or dizziness All other  systems reviewed and are otherwise negative except as noted above.    Blood pressure 154/70, pulse 102, height 5\' 1"  (1.549 m), weight 130 lb (58.968 kg).  General appearance: alert and no distress Neck: no adenopathy, no JVD, supple, symmetrical, trachea midline, thyroid not enlarged, symmetric, no tenderness/mass/nodules and soft right carotid bruit Lungs: clear to auscultation bilaterally Heart: regular rate and rhythm, S1, S2 normal, no murmur, click, rub or gallop Extremities: extremities normal, atraumatic, no cyanosis or edema and 2+ femorals without bruits, absent pedal pulses  EKG sinus tachycardia 110 with poor R-wave progression consistent with anteroseptal myocardial infarction  ASSESSMENT AND PLAN:   Carotid artery disease Known occluded left internal carotid artery with moderate right ICA stenosis that we thought a duplex ultrasound. This was recently checked 01/31/14 and was without change. He is on aspirin Plavix and is neurologically asymptomatic.  Peripheral arterial disease History of left common iliac artery stenting by myself back in 2003 and right SFA stenting in 2008. He does have tibial vessel disease. His most recent lower extremity Dopplers performed 01/31/14 revealed a right ABI of 0.44 with an occluded right SFA and popliteal artery, a left is left ABI 0.52 with an occluded left SFA unchanged from prior studies. He has mild left eyelid and claudication  Hyperlipidemia On statin therapy followed by his PCP  Essential hypertension Controlled on current medications  Coronary artery disease History of coronary artery bypass grafting February 2010 by Dr. Dorris FetchHendrickson for LIMA to his LAD, left radial to the first obtuse marginal branch, vein to ramus intermedius branch a sequential vein to acute marginal and distal right coronary artery. Myoview performed May 2000 and was nonischemic with apical scar consistent with his wall motion underbelly and known occluded LAD. He  denies chest pain or shortness of breath.      Runell GessJonathan J. Kimley Apsey MD FACP,FACC,FAHA, [facility] Detroit[facility]SCAI 02/21/2014 2:53 PM

## 2014-02-21 NOTE — Assessment & Plan Note (Signed)
History of coronary artery bypass grafting February 2010 by Dr. Dorris FetchHendrickson for LIMA to his LAD, left radial to the first obtuse marginal branch, vein to ramus intermedius branch a sequential vein to acute marginal and distal right coronary artery. Myoview performed May 2000 and was nonischemic with apical scar consistent with his wall motion underbelly and known occluded LAD. He denies chest pain or shortness of breath.

## 2014-02-21 NOTE — Assessment & Plan Note (Signed)
History of left common iliac artery stenting by myself back in 2003 and right SFA stenting in 2008. He does have tibial vessel disease. His most recent lower extremity Dopplers performed 01/31/14 revealed a right ABI of 0.44 with an occluded right SFA and popliteal artery, a left is left ABI 0.52 with an occluded left SFA unchanged from prior studies. He has mild left eyelid and claudication

## 2014-02-21 NOTE — Assessment & Plan Note (Signed)
Controlled on current medications 

## 2014-03-05 DIAGNOSIS — D510 Vitamin B12 deficiency anemia due to intrinsic factor deficiency: Secondary | ICD-10-CM | POA: Diagnosis not present

## 2014-03-13 ENCOUNTER — Ambulatory Visit: Payer: Federal, State, Local not specified - PPO | Admitting: Cardiovascular Disease

## 2014-04-09 DIAGNOSIS — D510 Vitamin B12 deficiency anemia due to intrinsic factor deficiency: Secondary | ICD-10-CM | POA: Diagnosis not present

## 2014-05-11 DIAGNOSIS — D510 Vitamin B12 deficiency anemia due to intrinsic factor deficiency: Secondary | ICD-10-CM | POA: Diagnosis not present

## 2014-06-06 DIAGNOSIS — E1059 Type 1 diabetes mellitus with other circulatory complications: Secondary | ICD-10-CM | POA: Diagnosis not present

## 2014-06-06 DIAGNOSIS — I10 Essential (primary) hypertension: Secondary | ICD-10-CM | POA: Diagnosis not present

## 2014-06-06 DIAGNOSIS — I739 Peripheral vascular disease, unspecified: Secondary | ICD-10-CM | POA: Diagnosis not present

## 2014-06-06 DIAGNOSIS — E780 Pure hypercholesterolemia: Secondary | ICD-10-CM | POA: Diagnosis not present

## 2014-06-06 DIAGNOSIS — N182 Chronic kidney disease, stage 2 (mild): Secondary | ICD-10-CM | POA: Diagnosis not present

## 2014-06-06 DIAGNOSIS — Z23 Encounter for immunization: Secondary | ICD-10-CM | POA: Diagnosis not present

## 2014-06-06 DIAGNOSIS — J449 Chronic obstructive pulmonary disease, unspecified: Secondary | ICD-10-CM | POA: Diagnosis not present

## 2014-06-08 ENCOUNTER — Other Ambulatory Visit: Payer: Self-pay

## 2014-06-11 DIAGNOSIS — D510 Vitamin B12 deficiency anemia due to intrinsic factor deficiency: Secondary | ICD-10-CM | POA: Diagnosis not present

## 2014-07-13 DIAGNOSIS — D510 Vitamin B12 deficiency anemia due to intrinsic factor deficiency: Secondary | ICD-10-CM | POA: Diagnosis not present

## 2014-07-17 ENCOUNTER — Other Ambulatory Visit (HOSPITAL_COMMUNITY): Payer: Self-pay | Admitting: Cardiovascular Disease

## 2014-07-17 ENCOUNTER — Encounter (HOSPITAL_COMMUNITY): Payer: Self-pay | Admitting: *Deleted

## 2014-07-17 DIAGNOSIS — I6529 Occlusion and stenosis of unspecified carotid artery: Secondary | ICD-10-CM

## 2014-08-07 ENCOUNTER — Encounter (HOSPITAL_COMMUNITY): Payer: Medicare Other

## 2014-08-09 ENCOUNTER — Ambulatory Visit (HOSPITAL_COMMUNITY)
Admission: RE | Admit: 2014-08-09 | Discharge: 2014-08-09 | Disposition: A | Discharge status: Home / Self Care | Payer: Medicare Other | Source: Ambulatory Visit | Attending: Cardiovascular Disease | Admitting: Cardiovascular Disease

## 2014-08-09 DIAGNOSIS — I6529 Occlusion and stenosis of unspecified carotid artery: Secondary | ICD-10-CM

## 2014-08-09 DIAGNOSIS — I779 Disorder of arteries and arterioles, unspecified: Secondary | ICD-10-CM | POA: Diagnosis not present

## 2014-08-09 NOTE — Progress Notes (Signed)
Carotid Duplex Completed. Shane Badeaux, BS, RDMS, RVT  

## 2014-08-13 DIAGNOSIS — D510 Vitamin B12 deficiency anemia due to intrinsic factor deficiency: Secondary | ICD-10-CM | POA: Diagnosis not present

## 2014-10-26 DIAGNOSIS — D510 Vitamin B12 deficiency anemia due to intrinsic factor deficiency: Secondary | ICD-10-CM | POA: Diagnosis not present

## 2014-11-23 DIAGNOSIS — D510 Vitamin B12 deficiency anemia due to intrinsic factor deficiency: Secondary | ICD-10-CM | POA: Diagnosis not present

## 2014-11-26 DIAGNOSIS — H26492 Other secondary cataract, left eye: Secondary | ICD-10-CM | POA: Diagnosis not present

## 2014-11-26 DIAGNOSIS — E11311 Type 2 diabetes mellitus with unspecified diabetic retinopathy with macular edema: Secondary | ICD-10-CM | POA: Diagnosis not present

## 2014-11-26 DIAGNOSIS — H3562 Retinal hemorrhage, left eye: Secondary | ICD-10-CM | POA: Diagnosis not present

## 2014-11-26 DIAGNOSIS — Z961 Presence of intraocular lens: Secondary | ICD-10-CM | POA: Diagnosis not present

## 2014-12-05 ENCOUNTER — Encounter (HOSPITAL_COMMUNITY): Payer: Self-pay | Admitting: *Deleted

## 2014-12-05 ENCOUNTER — Observation Stay (HOSPITAL_COMMUNITY)
Admission: EM | Admit: 2014-12-05 | Discharge: 2014-12-07 | Disposition: A | Discharge status: Home / Self Care | Payer: Medicare Other | Attending: Family Medicine | Admitting: Family Medicine

## 2014-12-05 DIAGNOSIS — R069 Unspecified abnormalities of breathing: Secondary | ICD-10-CM | POA: Diagnosis not present

## 2014-12-05 DIAGNOSIS — E871 Hypo-osmolality and hyponatremia: Secondary | ICD-10-CM | POA: Diagnosis present

## 2014-12-05 DIAGNOSIS — J449 Chronic obstructive pulmonary disease, unspecified: Secondary | ICD-10-CM | POA: Insufficient documentation

## 2014-12-05 DIAGNOSIS — Z720 Tobacco use: Secondary | ICD-10-CM | POA: Diagnosis present

## 2014-12-05 DIAGNOSIS — Z79899 Other long term (current) drug therapy: Secondary | ICD-10-CM | POA: Insufficient documentation

## 2014-12-05 DIAGNOSIS — Z87891 Personal history of nicotine dependence: Secondary | ICD-10-CM | POA: Diagnosis not present

## 2014-12-05 DIAGNOSIS — I10 Essential (primary) hypertension: Secondary | ICD-10-CM | POA: Insufficient documentation

## 2014-12-05 DIAGNOSIS — Z95820 Peripheral vascular angioplasty status with implants and grafts: Secondary | ICD-10-CM | POA: Insufficient documentation

## 2014-12-05 DIAGNOSIS — R072 Precordial pain: Secondary | ICD-10-CM | POA: Diagnosis not present

## 2014-12-05 DIAGNOSIS — Z7902 Long term (current) use of antithrombotics/antiplatelets: Secondary | ICD-10-CM | POA: Insufficient documentation

## 2014-12-05 DIAGNOSIS — I739 Peripheral vascular disease, unspecified: Secondary | ICD-10-CM | POA: Insufficient documentation

## 2014-12-05 DIAGNOSIS — F17210 Nicotine dependence, cigarettes, uncomplicated: Secondary | ICD-10-CM | POA: Diagnosis not present

## 2014-12-05 DIAGNOSIS — R0602 Shortness of breath: Secondary | ICD-10-CM | POA: Diagnosis not present

## 2014-12-05 DIAGNOSIS — R079 Chest pain, unspecified: Secondary | ICD-10-CM | POA: Insufficient documentation

## 2014-12-05 DIAGNOSIS — E119 Type 2 diabetes mellitus without complications: Secondary | ICD-10-CM | POA: Diagnosis not present

## 2014-12-05 DIAGNOSIS — Z951 Presence of aortocoronary bypass graft: Secondary | ICD-10-CM | POA: Diagnosis not present

## 2014-12-05 DIAGNOSIS — E785 Hyperlipidemia, unspecified: Secondary | ICD-10-CM | POA: Diagnosis not present

## 2014-12-05 DIAGNOSIS — I2510 Atherosclerotic heart disease of native coronary artery without angina pectoris: Secondary | ICD-10-CM | POA: Diagnosis not present

## 2014-12-05 DIAGNOSIS — J440 Chronic obstructive pulmonary disease with acute lower respiratory infection: Secondary | ICD-10-CM | POA: Diagnosis not present

## 2014-12-05 DIAGNOSIS — I160 Hypertensive urgency: Secondary | ICD-10-CM

## 2014-12-05 LAB — CBG MONITORING, ED: Glucose-Capillary: 167 mg/dL — ABNORMAL HIGH (ref 70–99)

## 2014-12-05 NOTE — ED Notes (Signed)
Pt arrives EMS from home. Pt c/o SOB x 2 hrs. States he had CP with the SOB as well. EMS arrived and gave 324 ASA and 1 NTG. Pain went from 7/10 to 2/10. Pt now states that the pain is basically gone. Pt states that he is breathing like he would normally. EMS found pt to be ST 100. CBG 174 by EMS. BP went from 210/100 to 169/81 after NTG.

## 2014-12-05 NOTE — ED Provider Notes (Signed)
CSN: [id number]     Arrival date & time 12/05/14  2322 History   First MD Initiated Contact with Patient 12/05/14 2334     This chart was scribed for Marisa Severin, MD by Arlan Organ, ED Scribe. This patient was seen in room D32C/D32C and the patient's care was started 11:40 PM.   Chief Complaint  Patient presents with  . Chest Pain  . Shortness of Breath   The history is provided by the patient. No language interpreter was used.    HPI Comments: Reginald Ayers brought in by EMS is a 64 y.o. male with a PMHx of coronary artery disease, peripheral arterial disease, carotid artery disease, diabetes, HTN, and hyperlipidemia who presents to the Emergency Department complaining of constant, ongoing, shortness of breath and chest tightness x 4 hours. He admits to worsening shortness of breath occasionally with exertion at baseline. Prior to arrival pt tried using his rescue inhaler without any improvement for symptoms at home. However, he was given 324 ASA and 1 NTG with relief for symptoms en route to department. No history of CHF. Pt takes Plavix daily. PSHx includes double bypass performed by Dr. York Ram of [facility] approximately 5 years ago. Last follow up with cardiology in past year. Reginald Ayers admits he did no take his blood pressure medications this morning but states he took his evening dose approximately 1-2 hours prior to arrival. No known allergies to medications.  Past Medical History  Diagnosis Date  . Coronary artery disease     sstatus post coronary artery bypass grafting February 2010 by Dr. Andrey Spearman  . Peripheral arterial disease     post left common iliac and right SFA stenting remotely  . Carotid artery disease   . Diabetes   . Hypertension   . Hyperlipidemia   . Tobacco abuse     recently discontinued   History reviewed. No pertinent past surgical history. No family history on file. History  Substance Use Topics  . Smoking status: Former Smoker   Quit date: 01/22/2014  . Smokeless tobacco: Current User    Types: Chew  . Alcohol Use: Not on file    Review of Systems  Constitutional: Negative for fever and chills.  Respiratory: Positive for chest tightness and shortness of breath.   Gastrointestinal: Negative for abdominal pain.  Skin: Negative for rash.  Psychiatric/Behavioral: Negative for confusion.      Allergies  Review of patient's allergies indicates no known allergies.  Home Medications   Prior to Admission medications   Medication Sig Start Date End Date Taking? Authorizing Provider  aspirin 81 MG tablet Take 81 mg by mouth daily.    Historical Provider, MD  clopidogrel (PLAVIX) 75 MG tablet Take 75 mg by mouth once.  07/24/13   Historical Provider, MD  Cyanocobalamin (VITAMIN B-12 IJ) Inject as directed every 30 (thirty) days.    Historical Provider, MD  glipiZIDE (GLUCOTROL XL) 10 MG 24 hr tablet Take 10 mg by mouth 2 (two) times daily.  07/24/13   Historical Provider, MD  irbesartan (AVAPRO) 300 MG tablet Take 300 mg by mouth daily.  07/24/13   Historical Provider, MD  JANUVIA 100 MG tablet Take 100 mg by mouth daily.  05/22/13   Historical Provider, MD  metFORMIN (GLUCOPHAGE) 500 MG tablet Take 1,000 mg by mouth 2 (two) times daily with a meal.  07/24/13   Historical Provider, MD  pioglitazone (ACTOS) 30 MG tablet Take 30 mg by mouth daily.  07/24/13  Historical Provider, MD  PROAIR HFA 108 (90 BASE) MCG/ACT inhaler Inhale 2 puffs into the lungs every 4 (four) hours as needed.  07/24/13   Historical Provider, MD  simvastatin (ZOCOR) 40 MG tablet Take 40 mg by mouth daily.    Historical Provider, MD  SPIRIVA HANDIHALER 18 MCG inhalation capsule Place 18 mcg into inhaler and inhale daily.  08/16/13   Historical Provider, MD  traMADol (ULTRAM) 50 MG tablet Take 50 mg by mouth 3 (three) times daily.  08/16/13   Historical Provider, MD   Triage Vitals: BP 175/60 mmHg  Pulse 99  Temp(Src) 98.3 F (36.8 C) (Oral)  Resp  20  Ht 5\' 3"  (1.6 m)  Wt 130 lb (58.968 kg)  BMI 23.03 kg/m2  SpO2 97%   Physical Exam  Constitutional: He is oriented to person, place, and time. He appears well-developed and well-nourished. No distress.  HENT:  Head: Normocephalic and atraumatic.  Nose: Nose normal.  Mouth/Throat: Oropharynx is clear and moist.  Eyes: Conjunctivae and EOM are normal. Pupils are equal, round, and reactive to light.  Neck: Normal range of motion. Neck supple. No JVD present. No tracheal deviation present. No thyromegaly present.  Cardiovascular: Normal rate, regular rhythm, normal heart sounds and intact distal pulses.  Exam reveals no gallop and no friction rub.   No murmur heard. Pulmonary/Chest: Effort normal and breath sounds normal. No stridor. No respiratory distress. He has no wheezes. He has no rales. He exhibits no tenderness.  Cough present, expiratory wheeze, crackles in bases  Abdominal: Soft. Bowel sounds are normal. He exhibits no distension and no mass. There is no tenderness. There is no rebound and no guarding.  Musculoskeletal: Normal range of motion. He exhibits no edema or tenderness.  Lymphadenopathy:    He has no cervical adenopathy.  Neurological: He is alert and oriented to person, place, and time. He displays normal reflexes. He exhibits normal muscle tone. Coordination normal.  Skin: Skin is warm and dry. No rash noted. No erythema. No pallor.  Psychiatric: He has a normal mood and affect. His behavior is normal. Judgment and thought content normal.  Nursing note and vitals reviewed.   ED Course  Procedures (including critical care time)  DIAGNOSTIC STUDIES: Oxygen Saturation is 97% on RA, adequate by my interpretation.    COORDINATION OF CARE: 11:39 PM-Discussed treatment plan with pt at bedside and pt agreed to plan.     Labs Review Labs Reviewed  CBC - Abnormal; Notable for the following:    WBC 11.4 (*)    All other components within normal limits  BASIC  METABOLIC PANEL - Abnormal; Notable for the following:    Sodium 130 (*)    Chloride 94 (*)    Glucose, Bld 183 (*)    All other components within normal limits  BASIC METABOLIC PANEL - Abnormal; Notable for the following:    Sodium 133 (*)    Glucose, Bld 134 (*)    All other components within normal limits  CBC - Abnormal; Notable for the following:    Hemoglobin 12.9 (*)    HCT 38.4 (*)    All other components within normal limits  CBG MONITORING, ED - Abnormal; Notable for the following:    Glucose-Capillary 167 (*)    All other components within normal limits  BRAIN NATRIURETIC PEPTIDE  TROPONIN I  TROPONIN I  TROPONIN I  Rosezena SensorI-STAT TROPOININ, ED    Imaging Review Dg Chest 2 View  12/06/2014   CLINICAL  DATA:  Chest pain and shortness of breath for 1 night. History of COPD.  EXAM: CHEST  2 VIEW  COMPARISON:  08/08/2012  FINDINGS: Patient is post median sternotomy. The lungs are hyperinflated. The cardiomediastinal contours are normal. Pulmonary vasculature is normal. No consolidation, pleural effusion, or pneumothorax. No acute osseous abnormalities are seen.  IMPRESSION: Hyperinflation likely related to emphysema. No localizing pulmonary process.   Electronically Signed   By: Rubye Oaks M.D.   On: 12/06/2014 03:03     EKG Interpretation   Date/Time:  Wednesday December 05 2014 23:27:02 EDT Ventricular Rate:  107 PR Interval:  154 QRS Duration: 104 QT Interval:  351 QTC Calculation: 468 R Axis:   106 Text Interpretation:  Sinus tachycardia Anterior infarct, old Confirmed by  Letesha Klecker  MD, Celise Bazar ([id number]) on 12/05/2014 11:54:06 PM      MDM   Final diagnoses:  Chest pain, unspecified chest pain type  Hypertensive urgency  Chronic obstructive pulmonary disease, unspecified COPD, unspecified chronic bronchitis type    64 year old male with chest pain.  History of coronary disease status post CABG, diabetes, hypertension.  Patient has been significantly hypertensive tonight.   Symptoms resolved with nitroglycerin.  Patient also with wheeze on exam, has history of COPD.  Plan for labs, chest x-ray.  I feel patient requires admission to hospital given his risk factors and concern for hypertensive urgency causing his chest pain.  I personally performed the services described in this documentation, which was scribed in my presence. The recorded information has been reviewed and is accurate.    Marisa Severin, MD 12/06/14 [phone]

## 2014-12-06 ENCOUNTER — Encounter (INDEPENDENT_AMBULATORY_CARE_PROVIDER_SITE_OTHER): Payer: Self-pay | Admitting: Ophthalmology

## 2014-12-06 ENCOUNTER — Emergency Department (HOSPITAL_COMMUNITY): Payer: Medicare Other

## 2014-12-06 DIAGNOSIS — R079 Chest pain, unspecified: Secondary | ICD-10-CM | POA: Insufficient documentation

## 2014-12-06 DIAGNOSIS — R0602 Shortness of breath: Secondary | ICD-10-CM | POA: Diagnosis not present

## 2014-12-06 DIAGNOSIS — E785 Hyperlipidemia, unspecified: Secondary | ICD-10-CM

## 2014-12-06 DIAGNOSIS — E118 Type 2 diabetes mellitus with unspecified complications: Secondary | ICD-10-CM

## 2014-12-06 DIAGNOSIS — I2510 Atherosclerotic heart disease of native coronary artery without angina pectoris: Secondary | ICD-10-CM

## 2014-12-06 DIAGNOSIS — J441 Chronic obstructive pulmonary disease with (acute) exacerbation: Secondary | ICD-10-CM | POA: Insufficient documentation

## 2014-12-06 DIAGNOSIS — R072 Precordial pain: Secondary | ICD-10-CM | POA: Diagnosis not present

## 2014-12-06 DIAGNOSIS — J449 Chronic obstructive pulmonary disease, unspecified: Secondary | ICD-10-CM | POA: Insufficient documentation

## 2014-12-06 DIAGNOSIS — Z720 Tobacco use: Secondary | ICD-10-CM | POA: Diagnosis present

## 2014-12-06 DIAGNOSIS — I10 Essential (primary) hypertension: Secondary | ICD-10-CM

## 2014-12-06 DIAGNOSIS — E871 Hypo-osmolality and hyponatremia: Secondary | ICD-10-CM | POA: Diagnosis present

## 2014-12-06 LAB — BASIC METABOLIC PANEL
ANION GAP: 13 (ref 5–15)
Anion gap: 11 (ref 5–15)
BUN: 11 mg/dL (ref 6–23)
BUN: 9 mg/dL (ref 6–23)
CALCIUM: 9.6 mg/dL (ref 8.4–10.5)
CALCIUM: 9.7 mg/dL (ref 8.4–10.5)
CO2: 23 mmol/L (ref 19–32)
CO2: 25 mmol/L (ref 19–32)
Chloride: 94 mmol/L — ABNORMAL LOW (ref 96–112)
Chloride: 97 mmol/L (ref 96–112)
Creatinine, Ser: 0.65 mg/dL (ref 0.50–1.35)
Creatinine, Ser: 0.74 mg/dL (ref 0.50–1.35)
GFR calc Af Amer: >90 mL/min (ref >90)
GFR calc Af Amer: >90 mL/min (ref >90)
GFR calc non Af Amer: >90 mL/min (ref >90)
GLUCOSE: 134 mg/dL — AB (ref 70–99)
Glucose, Bld: 183 mg/dL — ABNORMAL HIGH (ref 70–99)
POTASSIUM: 4.3 mmol/L (ref 3.5–5.1)
Potassium: 4.8 mmol/L (ref 3.5–5.1)
SODIUM: 130 mmol/L — AB (ref 135–145)
Sodium: 133 mmol/L — ABNORMAL LOW (ref 135–145)

## 2014-12-06 LAB — CBC
HEMATOCRIT: 38.4 % — AB (ref 39.0–52.0)
HEMATOCRIT: 40 % (ref 39.0–52.0)
HEMOGLOBIN: 13.4 g/dL (ref 13.0–17.0)
Hemoglobin: 12.9 g/dL — ABNORMAL LOW (ref 13.0–17.0)
MCH: 30.2 pg (ref 26.0–34.0)
MCH: 30.7 pg (ref 26.0–34.0)
MCHC: 33.5 g/dL (ref 30.0–36.0)
MCHC: 33.6 g/dL (ref 30.0–36.0)
MCV: 89.9 fL (ref 78.0–100.0)
MCV: 91.7 fL (ref 78.0–100.0)
PLATELETS: 239 10*3/uL (ref 150–400)
PLATELETS: 247 10*3/uL (ref 150–400)
RBC: 4.27 MIL/uL (ref 4.22–5.81)
RBC: 4.36 MIL/uL (ref 4.22–5.81)
RDW: 13.1 % (ref 11.5–15.5)
RDW: 13.1 % (ref 11.5–15.5)
WBC: 11.4 10*3/uL — AB (ref 4.0–10.5)
WBC: 9.7 10*3/uL (ref 4.0–10.5)

## 2014-12-06 LAB — TROPONIN I
Troponin I: 0.03 ng/mL (ref <0.031)
Troponin I: 0.03 ng/mL (ref <0.031)
Troponin I: <0.03 ng/mL (ref <0.031)

## 2014-12-06 LAB — BRAIN NATRIURETIC PEPTIDE: B Natriuretic Peptide: 91.9 pg/mL (ref 0.0–100.0)

## 2014-12-06 LAB — I-STAT TROPONIN, ED: TROPONIN I, POC: 0.02 ng/mL (ref 0.00–0.08)

## 2014-12-06 IMAGING — CR DG CHEST 2V
2 series · 2 of 2 positions shown · non-contrast
Comparison: [DATE]

CLINICAL DATA: Chest pain and shortness of breath for 1 night.
History of COPD.

EXAM:
CHEST  2 VIEW

[chest pa]
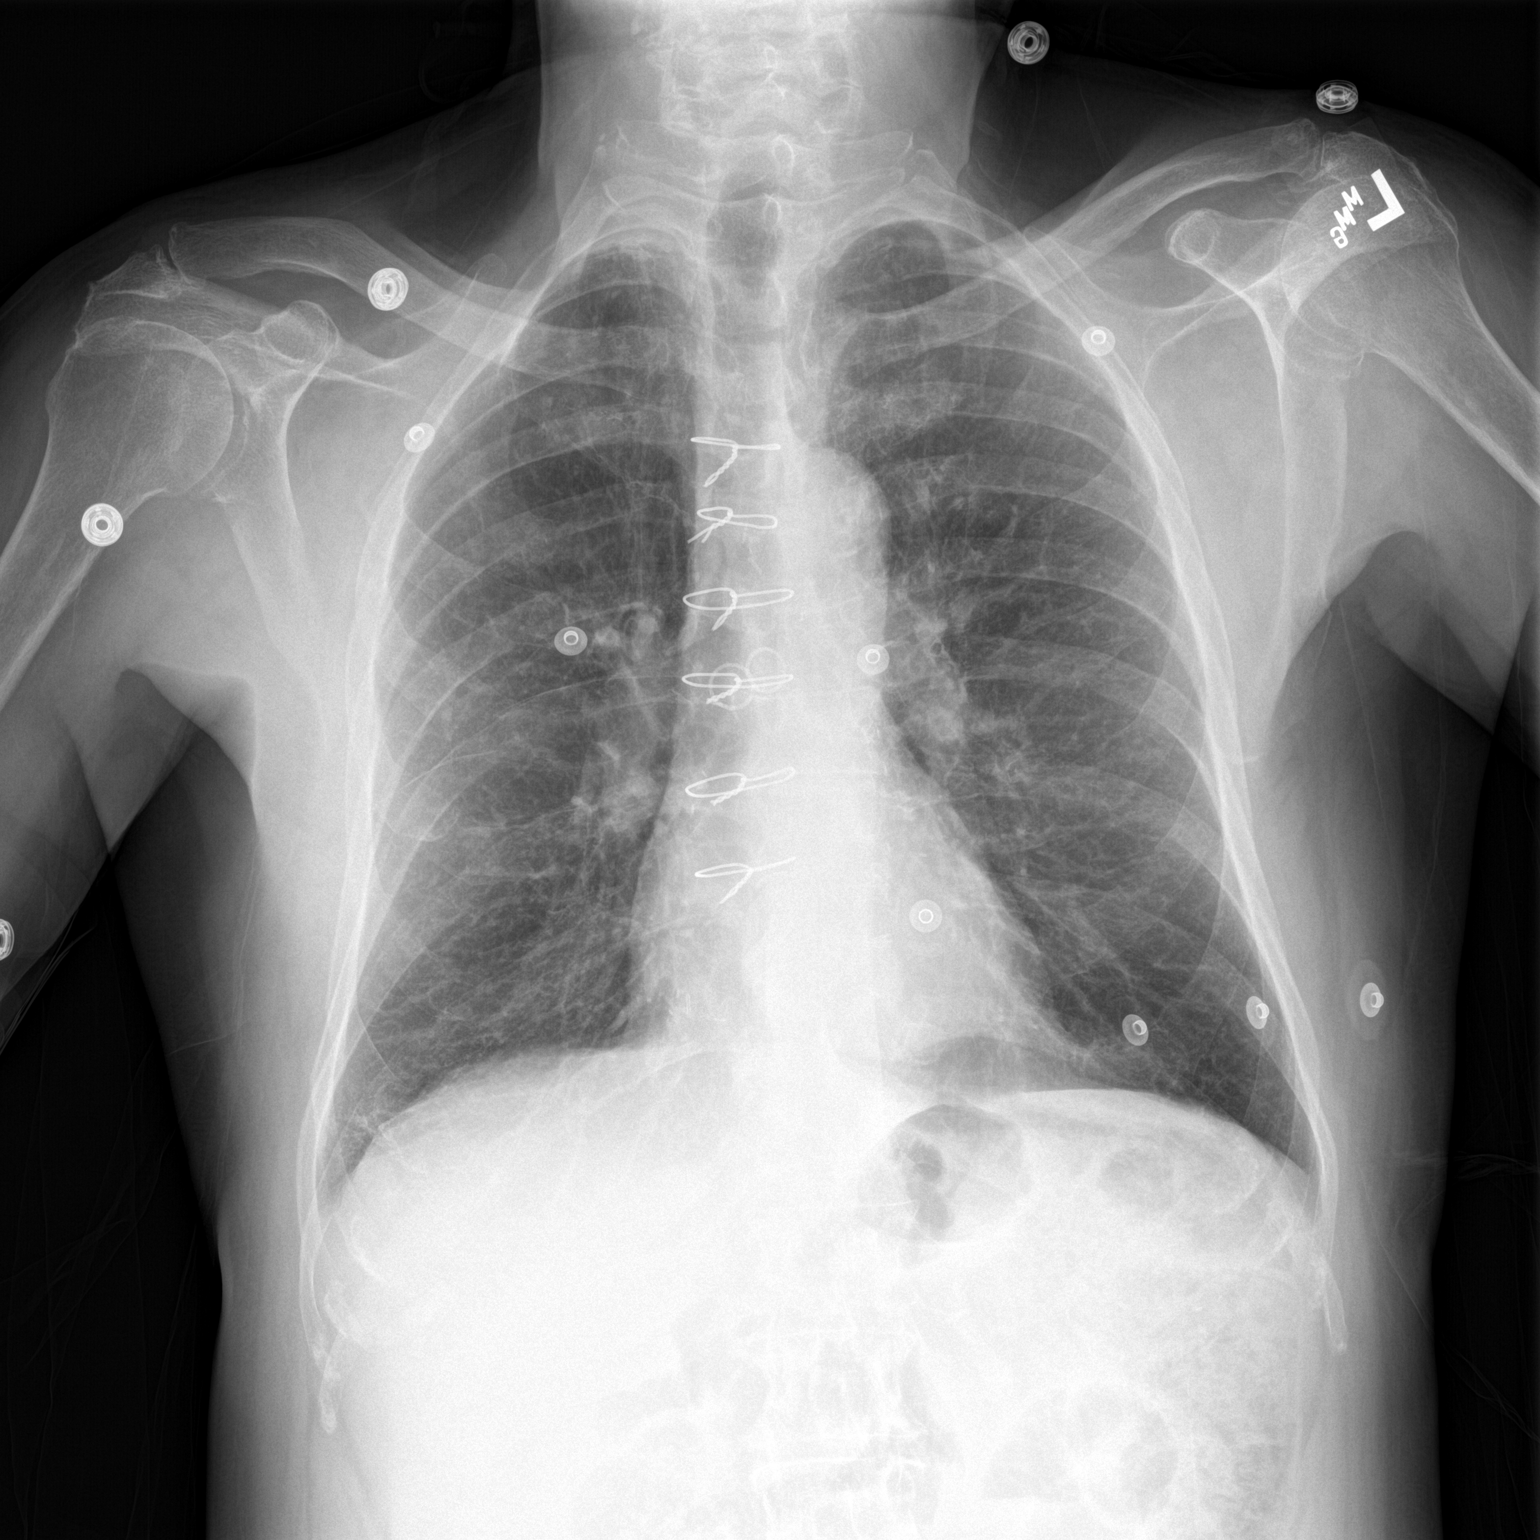

[chest lat]
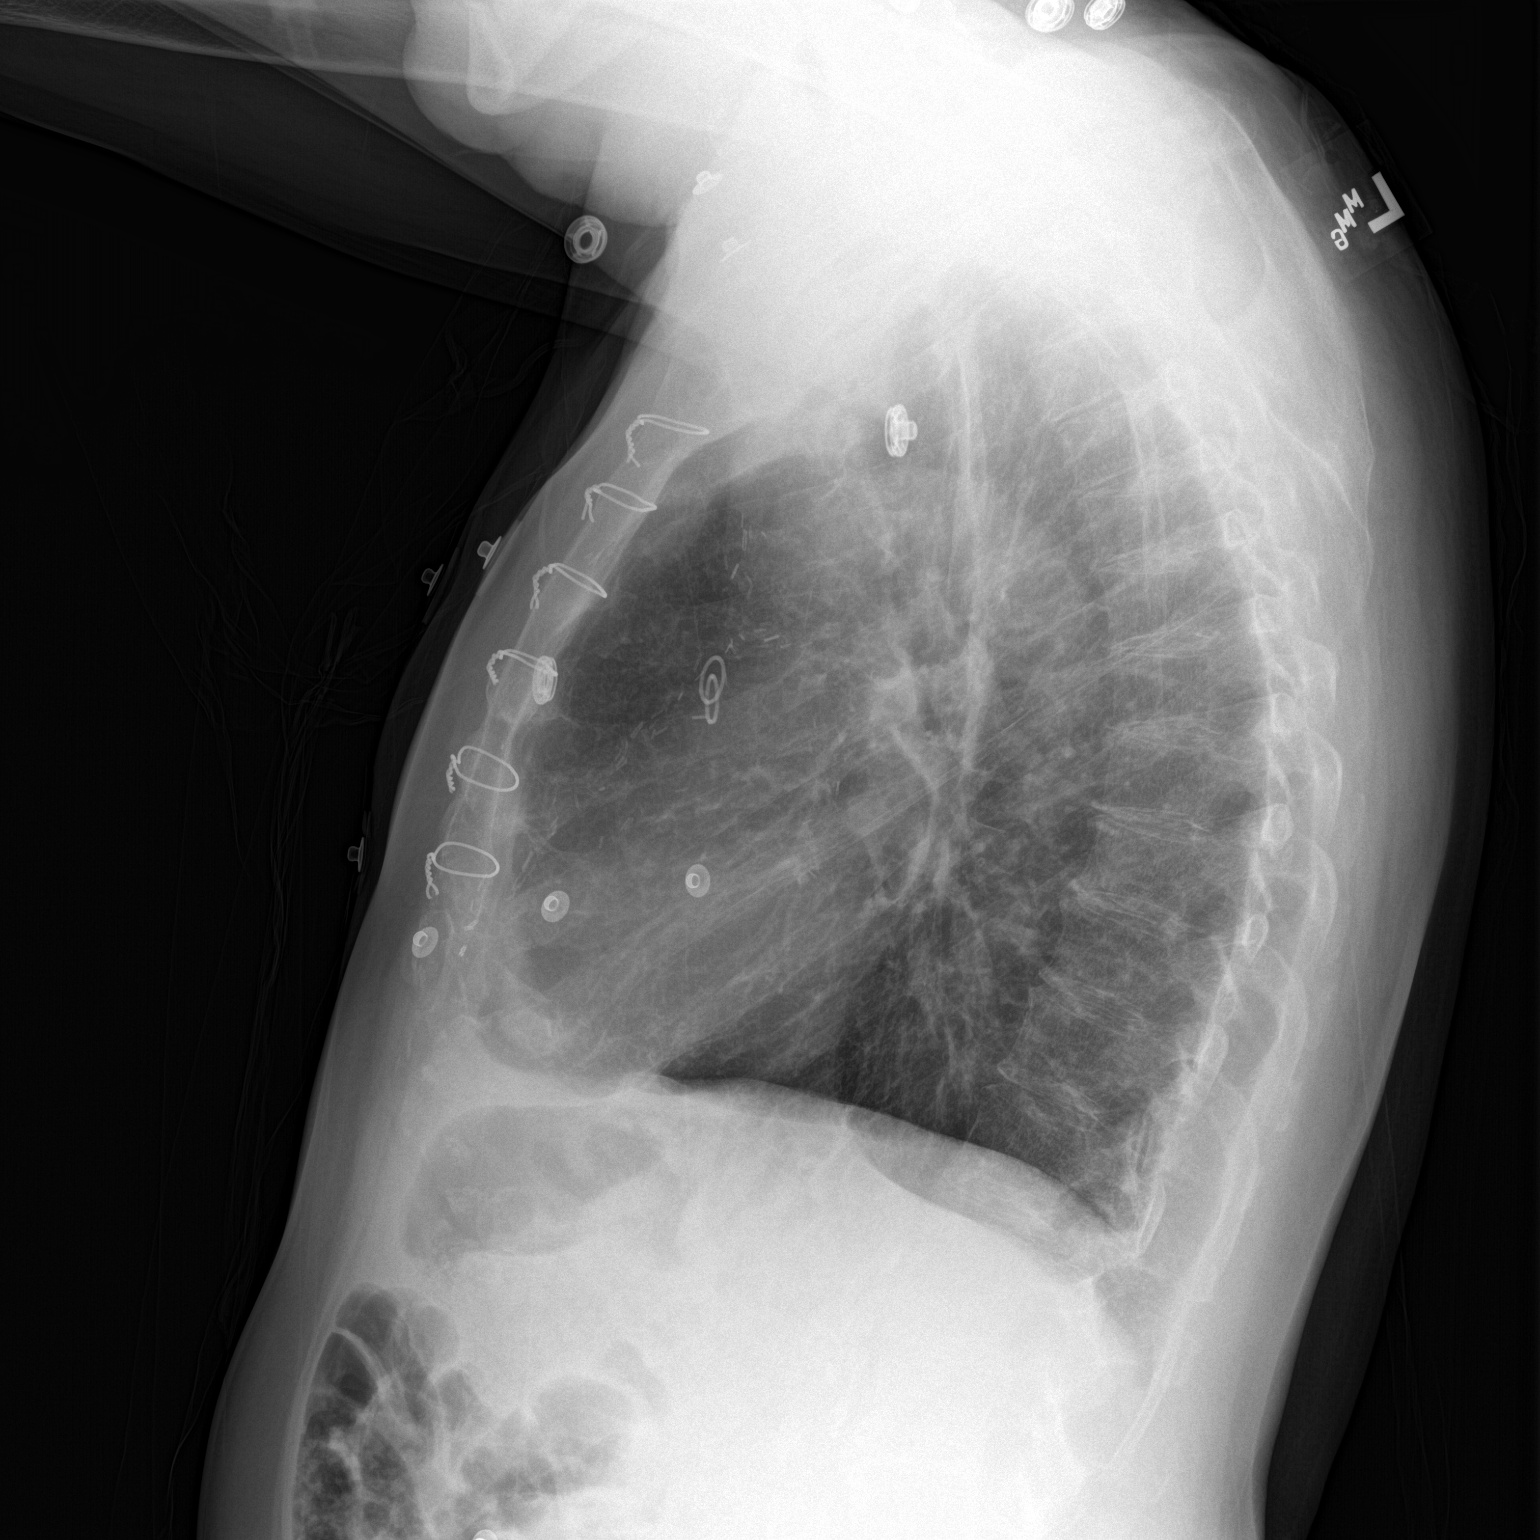

[2 of 2 positions shown; findings below may reference images not displayed]

FINDINGS: Patient is post median sternotomy. The lungs are hyperinflated. The
cardiomediastinal contours are normal. Pulmonary vasculature is
normal. No consolidation, pleural effusion, or pneumothorax. No
acute osseous abnormalities are seen.
IMPRESSION: Hyperinflation likely related to emphysema. No localizing pulmonary
process.

## 2014-12-06 MED ORDER — ALBUTEROL SULFATE (2.5 MG/3ML) 0.083% IN NEBU: 3.0000 mL | INHALATION_SOLUTION | RESPIRATORY_TRACT | Status: DC | PRN

## 2014-12-06 MED ORDER — NITROGLYCERIN 2 % TD OINT
1.0000 [in_us] | TOPICAL_OINTMENT | Freq: Four times a day (QID) | TRANSDERMAL | Status: DC
  Administered 2014-12-06 – 2014-12-07 (×5): 1 [in_us] via TOPICAL
  Filled 2014-12-06: qty 30
  Filled 2014-12-06: qty 1

## 2014-12-06 MED ORDER — IRBESARTAN 300 MG PO TABS
300.0000 mg | ORAL_TABLET | Freq: Every day | ORAL | Status: DC
Start: 2014-12-06 — End: 2014-12-07
  Administered 2014-12-06 – 2014-12-07 (×2): 300 mg via ORAL
  Filled 2014-12-06 (×2): qty 1

## 2014-12-06 MED ORDER — ENOXAPARIN SODIUM 40 MG/0.4ML ~~LOC~~ SOLN
40.0000 mg | SUBCUTANEOUS | Status: DC
  Administered 2014-12-06: 40 mg via SUBCUTANEOUS
  Filled 2014-12-06 (×2): qty 0.4

## 2014-12-06 MED ORDER — HYDROMORPHONE HCL 1 MG/ML IJ SOLN
0.5000 mg | INTRAMUSCULAR | Status: DC | PRN
  Filled 2014-12-06: qty 1

## 2014-12-06 MED ORDER — ALUM & MAG HYDROXIDE-SIMETH 200-200-20 MG/5ML PO SUSP: 30.0000 mL | Freq: Four times a day (QID) | ORAL | Status: DC | PRN

## 2014-12-06 MED ORDER — ASPIRIN 325 MG PO TABS
325.0000 mg | ORAL_TABLET | Freq: Every day | ORAL | Status: DC
  Administered 2014-12-06 – 2014-12-07 (×2): 325 mg via ORAL
  Filled 2014-12-06 (×3): qty 1

## 2014-12-06 MED ORDER — TIOTROPIUM BROMIDE MONOHYDRATE 18 MCG IN CAPS
18.0000 ug | ORAL_CAPSULE | Freq: Every day | RESPIRATORY_TRACT | Status: DC
  Administered 2014-12-06 – 2014-12-07 (×2): 18 ug via RESPIRATORY_TRACT
  Filled 2014-12-06: qty 5

## 2014-12-06 MED ORDER — SODIUM CHLORIDE 0.9 % IJ SOLN
3.0000 mL | Freq: Two times a day (BID) | INTRAMUSCULAR | Status: DC
  Administered 2014-12-06 – 2014-12-07 (×3): 3 mL via INTRAVENOUS

## 2014-12-06 MED ORDER — ONDANSETRON HCL 4 MG PO TABS: 4.0000 mg | ORAL_TABLET | Freq: Four times a day (QID) | ORAL | Status: DC | PRN

## 2014-12-06 MED ORDER — CLOPIDOGREL BISULFATE 75 MG PO TABS
75.0000 mg | ORAL_TABLET | Freq: Every day | ORAL | Status: DC
  Administered 2014-12-06 – 2014-12-07 (×2): 75 mg via ORAL
  Filled 2014-12-06 (×2): qty 1

## 2014-12-06 MED ORDER — ONDANSETRON HCL 4 MG/2ML IJ SOLN: 4.0000 mg | Freq: Four times a day (QID) | INTRAMUSCULAR | Status: DC | PRN

## 2014-12-06 MED ORDER — ACETAMINOPHEN 325 MG PO TABS: 650.0000 mg | ORAL_TABLET | Freq: Four times a day (QID) | ORAL | Status: DC | PRN

## 2014-12-06 MED ORDER — LINAGLIPTIN 5 MG PO TABS
5.0000 mg | ORAL_TABLET | Freq: Every day | ORAL | Status: DC
  Administered 2014-12-06 – 2014-12-07 (×2): 5 mg via ORAL
  Filled 2014-12-06 (×2): qty 1

## 2014-12-06 MED ORDER — ALBUTEROL SULFATE HFA 108 (90 BASE) MCG/ACT IN AERS: 2.0000 | INHALATION_SPRAY | RESPIRATORY_TRACT | Status: DC | PRN

## 2014-12-06 MED ORDER — SODIUM CHLORIDE 0.9 % IJ SOLN
3.0000 mL | Freq: Two times a day (BID) | INTRAMUSCULAR | Status: DC
  Administered 2014-12-06 (×2): 3 mL via INTRAVENOUS

## 2014-12-06 MED ORDER — ACETAMINOPHEN 650 MG RE SUPP: 650.0000 mg | Freq: Four times a day (QID) | RECTAL | Status: DC | PRN

## 2014-12-06 MED ORDER — SODIUM CHLORIDE 0.9 % IJ SOLN: 3.0000 mL | INTRAMUSCULAR | Status: DC | PRN

## 2014-12-06 MED ORDER — TRAMADOL HCL 50 MG PO TABS
50.0000 mg | ORAL_TABLET | Freq: Three times a day (TID) | ORAL | Status: DC
  Administered 2014-12-06 – 2014-12-07 (×4): 50 mg via ORAL
  Filled 2014-12-06 (×4): qty 1

## 2014-12-06 MED ORDER — SODIUM CHLORIDE 0.9 % IV SOLN: 250.0000 mL | INTRAVENOUS | Status: DC | PRN

## 2014-12-06 NOTE — H&P (Signed)
[facility] Admission History and Physical       Reginald GalaWilliam H Prazak WUJ:[id number] DOB: [birth year] DOA: 12/05/2014  Referring physician: EDP PCP: [name], KAREN, MD  Specialists:   Chief Complaint: Chest Pain   HPI: Reginald GalaWilliam H Ayers is a 64 y.o. male with a history of CAD S/P CABG, DM2, HTN, and Hyperlipidemia who presents to the ED with complaints of constant substernal chest pain or pressure that started at 7 pm and was unrelieved until he was administered SL NTG and Aspirin by EMS.   He had associated SOB, but no Nausea, Vomiting or Diaphoresis.   He was evaluated in the ED and referred for admission for a cardiac/ACS rule out.     Review of Systems:  Constitutional: No Weight Loss, No Weight Gain, Night Sweats, Fevers, Chills, Dizziness, Light Headedness, Fatigue, or Generalized Weakness HEENT: No Headaches, Difficulty Swallowing,Tooth/Dental Problems,Sore Throat,  No Sneezing, Rhinitis, Ear Ache, Nasal Congestion, or Post Nasal Drip,  Cardio-vascular:   +Chest pain, Orthopnea, PND, Edema in Lower Extremities, Anasarca, Dizziness, Palpitations  Resp:  +Dyspnea, No DOE, No Productive Cough, No Non-Productive Cough, No Hemoptysis, No Wheezing.    GI: No Heartburn, Indigestion, Abdominal Pain, Nausea, Vomiting, Diarrhea, Constipation, Hematemesis, Hematochezia, Melena, Change in Bowel Habits,  Loss of Appetite  GU: No Dysuria, No Change in Color of Urine, No Urgency or Urinary Frequency, No Flank pain.  Musculoskeletal: No Joint Pain or Swelling, No Decreased Range of Motion, No Back Pain.  Neurologic: No Syncope, No Seizures, Muscle Weakness, Paresthesia, Vision Disturbance or Loss, No Diplopia, No Vertigo, No Difficulty Walking,  Skin: No Rash or Lesions. Psych: No Change in Mood or Affect, No Depression or Anxiety, No Memory loss, No Confusion, or Hallucinations   Past Medical History  Diagnosis Date  . Coronary artery disease     sstatus post coronary artery bypass grafting  February 2010 by Dr. Andrey SpearmanSteve Hendrickson  . Peripheral arterial disease     post left common iliac and right SFA stenting remotely  . Carotid artery disease   . Diabetes   . Hypertension   . Hyperlipidemia   . Tobacco abuse     recently discontinued     History reviewed. No pertinent past surgical history.    Prior to Admission medications   Medication Sig Start Date End Date Taking? Authorizing Provider  clopidogrel (PLAVIX) 75 MG tablet Take 75 mg by mouth daily.  07/24/13  Yes Historical Provider, MD  Cyanocobalamin (VITAMIN B-12 IJ) Inject as directed every 30 (thirty) days.   Yes Historical Provider, MD  glipiZIDE (GLUCOTROL XL) 10 MG 24 hr tablet Take 10 mg by mouth 2 (two) times daily.  07/24/13  Yes Historical Provider, MD  irbesartan (AVAPRO) 300 MG tablet Take 300 mg by mouth daily.  07/24/13  Yes Historical Provider, MD  JANUVIA 100 MG tablet Take 100 mg by mouth daily.  05/22/13  Yes Historical Provider, MD  metFORMIN (GLUCOPHAGE) 500 MG tablet Take 1,000 mg by mouth 2 (two) times daily with a meal.  07/24/13  Yes Historical Provider, MD  pioglitazone (ACTOS) 30 MG tablet Take 30 mg by mouth daily.  07/24/13  Yes Historical Provider, MD  PROAIR HFA 108 (90 BASE) MCG/ACT inhaler Inhale 2 puffs into the lungs every 4 (four) hours as needed.  07/24/13  Yes Historical Provider, MD  SPIRIVA HANDIHALER 18 MCG inhalation capsule Place 18 mcg into inhaler and inhale daily.  08/16/13  Yes Historical Provider, MD  traMADol (ULTRAM) 50 MG tablet Take  50 mg by mouth 3 (three) times daily.  08/16/13  Yes Historical Provider, MD     No Known Allergies  Social History:  reports that he quit smoking about 10 months ago. His smokeless tobacco use includes Chew. His alcohol and drug histories are not on file.    No family history on file.     Physical Exam:  GEN:  Pleasant  Elderly Well Nourished and Well Developed  64 y.o. Caucasian male examined and in no acute distress; cooperative with  exam Filed Vitals:   12/06/14 0100 12/06/14 0145 12/06/14 0230 12/06/14 0300  BP: 149/59 145/64 150/68 167/72  Pulse: 85 85 90 89  Temp:      TempSrc:      Resp: Height:      Weight:      SpO2: 98% 96% 96% 96%   Blood pressure 167/72, pulse 89, temperature 98.3 F (36.8 C), temperature source Oral, resp. rate 13, height  (1.6 m), weight 58.968 kg (130 lb), SpO2 96 %. PSYCH: He is alert and oriented x4; does not appear anxious does not appear depressed; affect is normal HEENT: Normocephalic and Atraumatic, Mucous membranes pink; PERRLA; EOM intact; Fundi:  Benign;  No scleral icterus, Nares: Patent, Oropharynx: Clear, Fair Dentition,    Neck:  FROM, No Cervical Lymphadenopathy nor Thyromegaly or Carotid Bruit; No JVD; Breasts:: Not examined CHEST WALL: No tenderness CHEST: Normal respiration, clear to auscultation bilaterally HEART: Regular rate and rhythm; no murmurs rubs or gallops BACK: No kyphosis or scoliosis; No CVA tenderness ABDOMEN: Positive Bowel Sounds, Soft Non-Tender, No Rebound or Guarding; No Masses, No Organomegaly Rectal Exam: Not done EXTREMITIES: No Cyanosis, Clubbing, or Edema; No Ulcerations. Genitalia: not examined PULSES: 2+ and symmetric SKIN: Normal hydration no rash or ulceration CNS:  Alert and Oriented x 4, No Focal Deficits Vascular: pulses palpable throughout    Labs on Admission:  Basic Metabolic Panel:  Recent Labs Lab 12/05/14 2349  NA 130*  K 4.8  CL 94*  CO2 23  GLUCOSE 183*  BUN 11  CREATININE 0.74  CALCIUM 9.6   Liver Function Tests: No results for input(s): AST, ALT, ALKPHOS, BILITOT, PROT, ALBUMIN in the last 168 hours. No results for input(s): LIPASE, AMYLASE in the last 168 hours. No results for input(s): AMMONIA in the last 168 hours. CBC:  Recent Labs Lab 12/05/14 2349  WBC 11.4*  HGB 13.4  HCT 40.0  MCV 91.7  PLT 239   Cardiac Enzymes: No results for input(s): CKTOTAL, CKMB, CKMBINDEX,  TROPONINI in the last 168 hours.  BNP (last 3 results)  Recent Labs  12/05/14 2349  BNP 91.9    ProBNP (last 3 results) No results for input(s): PROBNP in the last 8760 hours.  CBG:  Recent Labs Lab 12/05/14 2330  GLUCAP 167*    Radiological Exams on Admission: Dg Chest 2 View  12/06/2014   CLINICAL DATA:  Chest pain and shortness of breath for 1 night. History of COPD.  EXAM: CHEST  2 VIEW  COMPARISON:  08/08/2012  FINDINGS: Patient is post median sternotomy. The lungs are hyperinflated. The cardiomediastinal contours are normal. Pulmonary vasculature is normal. No consolidation, pleural effusion, or pneumothorax. No acute osseous abnormalities are seen.  IMPRESSION: Hyperinflation likely related to emphysema. No localizing pulmonary process.   Electronically Signed   By: Rubye Oaks M.D.   On: 12/06/2014 03:03     EKG: Independently reviewed. Sinus Tachycardia rate = 107, Old Anterior  Infarct Changes   Assessment/Plan:   64 y.o. male with  Principal Problem:   1.   Chest pain   Telemetry Monitoring   Cycle Troponins   Nitropaste, O2 ASA,     Continue Plavix    Active Problems:   2.   Coronary artery disease   Continue Plavix, and Avapro Rx    3.    Hyponatremia-   Monitor Na+ levels   Gentle IVFs with NSS     4.   Diabetes   Continue Januvia Rx   Hold Metformin and Actos and Glipizide Rx   SSI coverage PRN   Check HbA1C     5.   Essential hypertension   Continue Avapro Rx   Monitor BPs     6.   Hyperlipidemia   Not on Med Rx currently     7.    COPD-   Continue Spiriva,    Continue Albuterol Inhaler PRN   Monitor O2 sats     8.   Tobacco abuse   Continue to decrease, currently down to 5 cigarettes a day     9.   DVT Prophylaxis   Lovenox          Code Status:     FULL CODE       Family Communication:   No Family Present    Disposition Plan:   Observation Status        Time spent:  78 Minutes     Ron Parker [facility] Pager [phone]   If 7AM -7PM Please Contact the Day Rounding Team MD for [facility]  If 7PM-7AM, Please Contact Night-Floor Coverage  [email or web address] Password [email or web address] 12/06/2014, 3:58 AM     ADDENDUM:   Patient was seen and examined on 12/06/2014

## 2014-12-06 NOTE — ED Notes (Signed)
Phlebotomy at bedside.

## 2014-12-06 NOTE — Progress Notes (Signed)
Patient seen and evaluated earlier this AM by my associate.  No new chest pain reported to me during visit  PE Gen: pt in nad, alert and awake CV: normal s1 and s2, no rubs Pulm: no increased wob, no wheezes  Will reassess next am. Troponin x 2 negative  Aloysious Vangieson

## 2014-12-06 NOTE — ED Notes (Signed)
Dr jenkins at bedside.  

## 2014-12-06 NOTE — Progress Notes (Signed)
UR completed 

## 2014-12-07 DIAGNOSIS — I10 Essential (primary) hypertension: Secondary | ICD-10-CM | POA: Diagnosis not present

## 2014-12-07 DIAGNOSIS — R072 Precordial pain: Secondary | ICD-10-CM | POA: Diagnosis not present

## 2014-12-07 DIAGNOSIS — R079 Chest pain, unspecified: Secondary | ICD-10-CM | POA: Diagnosis not present

## 2014-12-07 NOTE — Discharge Summary (Signed)
Physician Discharge Summary  Bertram GalaWilliam H Brine GNF:[id number] DOB: 07-04-1951 DOA: 12/05/2014  PCP: [name], KAREN, MD  Admit date: 12/05/2014 Discharge date: 12/07/2014  Time spent: > 35  minutes  Recommendations for Outpatient Follow-up:  1. Will have patient f/u with his cardiologist  Discharge Diagnoses:  Principal Problem:   Chest pain Active Problems:   Coronary artery disease   Diabetes   Essential hypertension   Hyperlipidemia   Tobacco abuse   Hyponatremia   Pain in the chest   Discharge Condition: stable  Diet recommendation: heart healthy  Filed Weights   12/05/14 2330  Weight: 58.968 kg (130 lb)    History of present illness:  64 y.o. male with a history of CAD S/P CABG, DM2, HTN, and Hyperlipidemia who presents to the ED with complaints of constant substernal chest pain or pressure that started at 7 pm and was unrelieved until he was administered SL NTG and Aspirin by EMS.  Hospital Course:  Chest pain - Subsided without any intervention. He thinks he may have drank coffee or eaten prior to the chest discomfort episode. As such I suspect reflux - Troponins 3 negative - No chest pain reported on day of discharge. Patient inquiring about discharge and looking forward to going home.  Procedures:  None  Consultations:  None  Discharge Exam: Filed Vitals:   12/07/14 1055  BP: 143/84  Pulse: 97  Temp: 98 F (36.7 C)  Resp: 18    General: Pt in nad, alert and awake Cardiovascular: rrr, no mrg Respiratory: cta bl, no wheezes  Discharge Instructions   Discharge Instructions    Call MD for:  difficulty breathing, headache or visual disturbances    Complete by:  As directed      Call MD for:  temperature >100.4    Complete by:  As directed      Diet - low sodium heart healthy    Complete by:  As directed      Discharge instructions    Complete by:  As directed   Please f/u with your Cardiologist in 1-2 weeks     Increase activity slowly     Complete by:  As directed           Current Discharge Medication List    CONTINUE these medications which have NOT CHANGED   Details  clopidogrel (PLAVIX) 75 MG tablet Take 75 mg by mouth daily.     Cyanocobalamin (VITAMIN B-12 IJ) Inject as directed every 30 (thirty) days.    glipiZIDE (GLUCOTROL XL) 10 MG 24 hr tablet Take 10 mg by mouth 2 (two) times daily.     irbesartan (AVAPRO) 300 MG tablet Take 300 mg by mouth daily.     JANUVIA 100 MG tablet Take 100 mg by mouth daily.     metFORMIN (GLUCOPHAGE) 500 MG tablet Take 1,000 mg by mouth 2 (two) times daily with a meal.     pioglitazone (ACTOS) 30 MG tablet Take 30 mg by mouth daily.     PROAIR HFA 108 (90 BASE) MCG/ACT inhaler Inhale 2 puffs into the lungs every 4 (four) hours as needed.     SPIRIVA HANDIHALER 18 MCG inhalation capsule Place 18 mcg into inhaler and inhale daily.     traMADol (ULTRAM) 50 MG tablet Take 50 mg by mouth 3 (three) times daily.        No Known Allergies Follow-up Information    Follow up with Runell GessBERRY,JONATHAN J, MD On 01/01/2015.   Specialty:  Cardiology  Why:  F/u appt may 10 at 1:30 pm see Nada Boozer, NP  [address] [place] information:   [address] [place] [address] [phone]        The results of significant diagnostics from this hospitalization (including imaging, microbiology, ancillary and laboratory) are listed below for reference.    Significant Diagnostic Studies: Dg Chest 2 View  12/06/2014   CLINICAL DATA:  Chest pain and shortness of breath for 1 night. History of COPD.  EXAM: CHEST  2 VIEW  COMPARISON:  08/08/2012  FINDINGS: Patient is post median sternotomy. The lungs are hyperinflated. The cardiomediastinal contours are normal. Pulmonary vasculature is normal. No consolidation, pleural effusion, or pneumothorax. No acute osseous abnormalities are seen.  IMPRESSION: Hyperinflation likely related to emphysema. No localizing pulmonary process.    Electronically Signed   By: Rubye Oaks M.D.   On: 12/06/2014 03:03    Microbiology: No results found for this or any previous visit (from the past 240 hour(s)).   Labs: Basic Metabolic Panel:  Recent Labs Lab 12/05/14 2349 12/06/14 0427  NA 130* 133*  K 4.8 4.3  CL 94* 97  CO2 23 25  GLUCOSE 183* 134*  BUN 11 9  CREATININE 0.74 0.65  CALCIUM 9.6 9.7   Liver Function Tests: No results for input(s): AST, ALT, ALKPHOS, BILITOT, PROT, ALBUMIN in the last 168 hours. No results for input(s): LIPASE, AMYLASE in the last 168 hours. No results for input(s): AMMONIA in the last 168 hours. CBC:  Recent Labs Lab 12/05/14 2349 12/06/14 0427  WBC 11.4* 9.7  HGB 13.4 12.9*  HCT 40.0 38.4*  MCV 91.7 89.9  PLT 239 247   Cardiac Enzymes:  Recent Labs Lab 12/06/14 0427 12/06/14 1000 12/06/14 1534  TROPONINI 0.03 0.03 <0.03   BNP: BNP (last 3 results)  Recent Labs  12/05/14 2349  BNP 91.9    ProBNP (last 3 results) No results for input(s): PROBNP in the last 8760 hours.  CBG:  Recent Labs Lab 12/05/14 2330  GLUCAP 167*       Signed:  Penny Pia  [facility] 12/07/2014, 11:53 AM

## 2014-12-07 NOTE — Progress Notes (Signed)
Pt discharge education and instructions completed with pt. Pt voices understanding and denies any questions. Pt IV and telemetry removed. Pt discharge home and states he has a cab driver that will come pick him up. Pt discharge to the discharge lounge to await on his cab driver. Reginald MerlesP. Amo Janiyla Long RN.

## 2014-12-11 ENCOUNTER — Encounter: Payer: Self-pay | Admitting: *Deleted

## 2014-12-12 ENCOUNTER — Encounter (INDEPENDENT_AMBULATORY_CARE_PROVIDER_SITE_OTHER): Payer: Medicare Other | Admitting: Ophthalmology

## 2014-12-12 DIAGNOSIS — E11351 Type 2 diabetes mellitus with proliferative diabetic retinopathy with macular edema: Secondary | ICD-10-CM | POA: Diagnosis not present

## 2014-12-12 DIAGNOSIS — E11329 Type 2 diabetes mellitus with mild nonproliferative diabetic retinopathy without macular edema: Secondary | ICD-10-CM

## 2014-12-12 DIAGNOSIS — H3531 Nonexudative age-related macular degeneration: Secondary | ICD-10-CM

## 2014-12-12 DIAGNOSIS — E11311 Type 2 diabetes mellitus with unspecified diabetic retinopathy with macular edema: Secondary | ICD-10-CM

## 2014-12-12 DIAGNOSIS — H35033 Hypertensive retinopathy, bilateral: Secondary | ICD-10-CM

## 2014-12-12 DIAGNOSIS — H43813 Vitreous degeneration, bilateral: Secondary | ICD-10-CM

## 2014-12-12 DIAGNOSIS — H26493 Other secondary cataract, bilateral: Secondary | ICD-10-CM | POA: Diagnosis not present

## 2014-12-12 DIAGNOSIS — I10 Essential (primary) hypertension: Secondary | ICD-10-CM

## 2014-12-14 ENCOUNTER — Telehealth: Payer: Self-pay | Admitting: Cardiovascular Disease

## 2014-12-14 NOTE — Telephone Encounter (Signed)
Hx: CAD, PAD, HTN, COPD  Re: Nitro SL Rx request  Inquired if patient has been having recent CP or active CP now. He denies.  States he had a recent episode of shortness of breath ("catching"), assocs some "chest tightness" w/ this.  Roommate called 911, pt transported by ambulance.  He states prior to arriving at ED he felt better, symptoms of dyspnea resolved "immediately" w/ 324mg  chewable ASA & Nitro SL x1.   He did have BP elevation assoc w/ this event  Pt notes he'd had Rx for Nitro, bottle "about 66 years old", does not recall who prescribed. He is requesting to have new Rx this medication on-hand. Notes symptoms usually resolve w/ his Proair, however.  I noted this sounds like more of a lung-related issue, though I would defer to Dr. Allyson SabalBerry for advice/OK.  Does have TCM f/u scheduled w/ Vernona RiegerLaura on 5/12.

## 2014-12-14 NOTE — Telephone Encounter (Signed)
°  1. Which medications need to be refilled? NTG(new rx needed)   2. Which pharmacy is medication to be sent to? [facility] on [address]   3. Do they need a 30 day or 90 day supply? 30 prn  4. Would they like a call back once the medication has been sent to the pharmacy? yes

## 2014-12-17 NOTE — Telephone Encounter (Signed)
Can this encounter be closed?

## 2014-12-21 DIAGNOSIS — Z125 Encounter for screening for malignant neoplasm of prostate: Secondary | ICD-10-CM | POA: Diagnosis not present

## 2014-12-21 DIAGNOSIS — I872 Venous insufficiency (chronic) (peripheral): Secondary | ICD-10-CM | POA: Diagnosis not present

## 2014-12-21 DIAGNOSIS — J439 Emphysema, unspecified: Secondary | ICD-10-CM | POA: Diagnosis not present

## 2014-12-21 DIAGNOSIS — E1151 Type 2 diabetes mellitus with diabetic peripheral angiopathy without gangrene: Secondary | ICD-10-CM | POA: Diagnosis not present

## 2014-12-21 DIAGNOSIS — F17210 Nicotine dependence, cigarettes, uncomplicated: Secondary | ICD-10-CM | POA: Diagnosis not present

## 2014-12-21 DIAGNOSIS — Z23 Encounter for immunization: Secondary | ICD-10-CM | POA: Diagnosis not present

## 2014-12-21 DIAGNOSIS — Z1389 Encounter for screening for other disorder: Secondary | ICD-10-CM | POA: Diagnosis not present

## 2014-12-21 DIAGNOSIS — Z0000 Encounter for general adult medical examination without abnormal findings: Secondary | ICD-10-CM | POA: Diagnosis not present

## 2014-12-21 DIAGNOSIS — I10 Essential (primary) hypertension: Secondary | ICD-10-CM | POA: Diagnosis not present

## 2014-12-21 DIAGNOSIS — E11319 Type 2 diabetes mellitus with unspecified diabetic retinopathy without macular edema: Secondary | ICD-10-CM | POA: Diagnosis not present

## 2014-12-21 DIAGNOSIS — I8310 Varicose veins of unspecified lower extremity with inflammation: Secondary | ICD-10-CM | POA: Diagnosis not present

## 2014-12-21 DIAGNOSIS — I739 Peripheral vascular disease, unspecified: Secondary | ICD-10-CM | POA: Diagnosis not present

## 2014-12-24 DIAGNOSIS — D510 Vitamin B12 deficiency anemia due to intrinsic factor deficiency: Secondary | ICD-10-CM | POA: Diagnosis not present

## 2015-01-01 ENCOUNTER — Ambulatory Visit (INDEPENDENT_AMBULATORY_CARE_PROVIDER_SITE_OTHER): Payer: Medicare Other | Admitting: Cardiology

## 2015-01-01 ENCOUNTER — Encounter: Payer: Self-pay | Admitting: Cardiology

## 2015-01-01 VITALS — BP 160/62 | HR 98 | Ht 61.0 in | Wt 137.0 lb

## 2015-01-01 DIAGNOSIS — I2510 Atherosclerotic heart disease of native coronary artery without angina pectoris: Secondary | ICD-10-CM | POA: Diagnosis not present

## 2015-01-01 DIAGNOSIS — R079 Chest pain, unspecified: Secondary | ICD-10-CM

## 2015-01-01 DIAGNOSIS — I739 Peripheral vascular disease, unspecified: Secondary | ICD-10-CM | POA: Diagnosis not present

## 2015-01-01 DIAGNOSIS — E785 Hyperlipidemia, unspecified: Secondary | ICD-10-CM | POA: Diagnosis not present

## 2015-01-01 DIAGNOSIS — I6529 Occlusion and stenosis of unspecified carotid artery: Secondary | ICD-10-CM

## 2015-01-01 DIAGNOSIS — I2583 Coronary atherosclerosis due to lipid rich plaque: Secondary | ICD-10-CM

## 2015-01-01 MED ORDER — SIMVASTATIN 20 MG PO TABS: 20.0000 mg | ORAL_TABLET | Freq: Every day | ORAL | Status: DC

## 2015-01-01 NOTE — Patient Instructions (Addendum)
Your physician recommends that you schedule a follow-up appointment in: 3 Months with Dr Allyson SabalBerry  Your physician has requested that you have a lower extremity arterial duplex. This test is an ultrasound of the arteries in the legs. It looks at arterial blood flow in the legs. Allow one hour for Lower Arterial scans. There are no restrictions or special instructions

## 2015-01-01 NOTE — Progress Notes (Signed)
Cardiology Office Note   Date:  01/01/2015   ID:  Reginald Ayers, DOB 03-11-1951, MRN [id number]  PCP:  [name], KAREN, MD  Cardiologist:  Dr  Allyson SabalBerry   Chief Complaint  Patient presents with  . Hospitalization Follow-up    chest pain      History of Present Illness: Reginald Ayers is a 64 y.o. male who presents for post hospital visit for chest pain relief with NTG and was neg for MI.  Was more of a tightness which has resolved.  He does complain of rt calf claudication, increased in frequency a burning pain.  Does not resolve with rest.  This has increased over the last few months.  Began with edema in both legs and while this improved with diuretic per PCP, he has increased claudication now Rt > Lt.  He has a history of CAD and PAD. Dr. Erlene QuanJ. Berry  stented his proximal left common iliac artery and right SFA in April of 2008. He does have significant infrapopliteal disease bilaterally and known occluded left SFA. He also has an occluded right SFA by Dopplers in our office last September. His ABIs were 0.48 on the right and 0.52 on the left, but now with new claudication will need to re-evaluate. He has known carotid disease with occluded left internal carotid artery and moderate right ICA stenosis, which has remained ultrasonographically stable. Last checked 07/2014.  He is neurologically asymptomatic. His other problems include diabetes, hypertension, hyperlipidemia, and long-term tobacco abuse, smoking he has decreased significantly.  At times he uses chewing tobacco but we did discuss the effect of nicotine on his legs blood vessels by chew or inhaled.     He had coronary artery bypass grafting by Dr. Andrey SpearmanSteve Hendrickson in February of 2010 with LIMA to his LAD, left radial to the first obtuse marginal branch, vein to ramus intermedius, and a sequential vein to an acute marginal and distal right coronary artery. Myoview performed in May of 2010 was nonischemic and showed apical scar  consistent with his wall motion abnormality and his known occluded LAD.Dr. Evlyn Couriersborne/now Dr. Cherylann ParrSchooner  follows his lipid profile but no longer on zocor and LDL 102 goal would be 70 or less.  He has agreed to go back on his zocor.  Last myoview was 08/2013 with low risk and no ischemia. Last echo 01/2014 with EF 55-60% G1DD.    Today no further chest tightness.      Past Medical History  Diagnosis Date  . Coronary artery disease     sstatus post coronary artery bypass grafting February 2010 by Dr. Andrey SpearmanSteve Hendrickson  . Peripheral arterial disease     post left common iliac and right SFA stenting remotely  . Carotid artery disease   . Diabetes   . Hypertension   . Hyperlipidemia   . Tobacco abuse     recently discontinued    Past Surgical History  Procedure Laterality Date  . Cardiac catheterization  2010  . Carotid angiogram  2014  . Doppler echocardiography  2010  . Nm myoview ltd  2010     Current Outpatient Prescriptions  Medication Sig Dispense Refill  . clopidogrel (PLAVIX) 75 MG tablet Take 75 mg by mouth daily.     . Cyanocobalamin (VITAMIN B-12 IJ) Inject as directed every 30 (thirty) days.    Marland Kitchen. glipiZIDE (GLUCOTROL XL) 10 MG 24 hr tablet Take 10 mg by mouth 2 (two) times daily.     . irbesartan (AVAPRO) 300  MG tablet Take 300 mg by mouth daily.     Marland Kitchen JANUVIA 100 MG tablet Take 100 mg by mouth daily.     . metFORMIN (GLUCOPHAGE) 500 MG tablet Take 1,000 mg by mouth 2 (two) times daily with a meal.     . NITROSTAT 0.4 MG SL tablet Take 1 tablet by mouth as needed.  0  . pioglitazone (ACTOS) 30 MG tablet Take 30 mg by mouth daily.     Marland Kitchen PROAIR HFA 108 (90 BASE) MCG/ACT inhaler Inhale 2 puffs into the lungs every 4 (four) hours as needed.     Marland Kitchen SPIRIVA HANDIHALER 18 MCG inhalation capsule Place 18 mcg into inhaler and inhale daily.     . traMADol (ULTRAM) 50 MG tablet Take 50 mg by mouth 3 (three) times daily.      No current facility-administered medications for this visit.     Allergies:   Review of patient's allergies indicates no known allergies.    Social History:  The patient  reports that he quit smoking about 11 months ago. His smokeless tobacco use includes Chew.   Family History:  The patient's family history includes CVA in his mother; Diabetes in his sister; Heart attack in his mother; Hypertension in his sister.    ROS:  General:no colds or fevers, + weight increase Skin:no rashes or ulcers, redness of rt calf HEENT:no blurred vision, no congestion CV:see HPI PUL:see HPI GI:no diarrhea constipation or melena, no indigestion GU:no hematuria, no dysuria MS:no joint pain, no claudication Neuro:no syncope, no lightheadedness Endo:+ diabetes stable, no thyroid disease  Wt Readings from Last 3 Encounters:  01/01/15 137 lb (62.143 kg)  12/05/14 130 lb (58.968 kg)  02/21/14 130 lb (58.968 kg)     PHYSICAL EXAM: VS:  BP 160/62 mmHg  Pulse 98  Ht  (1.549 m)  Wt 137 lb (62.143 kg)  BMI 25.90 kg/m2 , BMI Body mass index is 25.9 kg/(m^2). General:Pleasant affect, NAD Skin:Warm and dry, brisk capillary refill HEENT:normocephalic, sclera clear, mucus membranes moist Neck:supple, no JVD, + rt carotid bruit  Heart:S1S2 RRR without murmur, gallup, rub or click Lungs:clear without rales, rhonchi, or wheezes ZOX:WRUE, non tender, + BS, do not palpate liver spleen or masses Ext:tr lower ext edema- bil, rt post calf pink compared to rest of leg, this has been this way for some time. R> L edema 1+ to tr.  Rt leg with shiny appearance.  2+ radial pulses Neuro:alert and oriented X 3, MAE, follows commands, + facial symmetry    EKG:  EKG is NOT ordered today.    Recent Labs: 12/05/2014: B Natriuretic Peptide 91.9 12/06/2014: BUN 9; Creatinine 0.65; Hemoglobin 12.9*; Platelets 247; Potassium 4.3; Sodium 133*    Lipid Panel No results found for: CHOL, TRIG, HDL, CHOLHDL, VLDL, LDLCALC, LDLDIRECT     Other studies Reviewed: Additional  studies/ records that were reviewed today include: hospital note, previous OV. .   ASSESSMENT AND PLAN:  Coronary artery disease History of coronary artery bypass grafting February 2010 by Dr. [name] for LIMA to his LAD, left radial to the first obtuse marginal branch, vein to ramus intermedius branch a sequential vein to acute marginal and distal right coronary artery. Myoview performed Jan 2015 and was nonischemic with apical scar consistent with his wall motion underbelly and known occluded LAD. He denies chest pain or shortness of breath.  Peripheral arterial disease History of left common iliac artery stenting by Dr. Erlene Quan  back in 2003 and  right SFA stenting in 2008. He does have tibial vessel disease. His most recent lower extremity Dopplers performed 01/31/14 revealed a right ABI of 0.44 with an occluded right SFA and popliteal artery, a left is left ABI 0.52 with an occluded left SFA unchanged from prior studies. He has mild left eyelid and claudication.  Now with increased claudication- recheck arterial dopplers. He will follow up with Dr. Erlene QuanJ. Berry in 3 months but if abnormal LEAdopplers may need to be seen earlier.    Hyperlipidemia Not on statin therapy have asked him to resume zocor, he stated he tolerated without complications, but with his CAD and PAD it would be beneficial.   Carotid artery disease Known occluded left internal carotid artery with moderate right ICA stenosis that we thought a duplex ultrasound. This was recently checked 01/31/14 and was without change. He is on aspirin Plavix and is neurologically asymptomatic  Tobacco use: Discussed importance of stopping, and nicotine's effect on vascular system.   Current medicines are reviewed with the patient today.  The patient Has no concerns regarding medicines.  The following changes have been made:  See above Labs/ tests ordered today include:see above  Disposition:   FU:  see above  Signed, Leone BrandINGOLD,LAURA R,  NP  01/01/2015 3:10 PM    [facility] [address], [place], [place]  [address]/ [address] [place], [place] Phone: [phone]) [phone]; Fax: [phone]) [phone]  [phone]

## 2015-01-02 ENCOUNTER — Encounter: Payer: Self-pay | Admitting: Cardiology

## 2015-01-03 ENCOUNTER — Encounter: Payer: Self-pay | Admitting: Cardiovascular Disease

## 2015-01-07 ENCOUNTER — Ambulatory Visit (HOSPITAL_COMMUNITY)
Admission: RE | Admit: 2015-01-07 | Discharge: 2015-01-07 | Disposition: A | Discharge status: Home / Self Care | Payer: Medicare Other | Source: Ambulatory Visit | Attending: Cardiology | Admitting: Cardiology

## 2015-01-07 DIAGNOSIS — I739 Peripheral vascular disease, unspecified: Secondary | ICD-10-CM | POA: Diagnosis not present

## 2015-01-07 NOTE — Progress Notes (Signed)
Arterial Duplex Lower Ext. Completed.  Preliminary results by tech - Bilateral SFA occlusion with severe arterial insufficiency at rest ABIs. Reginald Ayers, BS, RDMS, RVT

## 2015-01-10 ENCOUNTER — Encounter: Payer: Self-pay | Admitting: Cardiovascular Disease

## 2015-01-14 ENCOUNTER — Encounter: Payer: Self-pay | Admitting: *Deleted

## 2015-01-14 ENCOUNTER — Telehealth: Payer: Self-pay | Admitting: Cardiovascular Disease

## 2015-01-14 NOTE — Telephone Encounter (Signed)
I spoke with patient and reviewed doppler results.  

## 2015-01-14 NOTE — Telephone Encounter (Signed)
Returning your call,concerning his test results. °

## 2015-01-25 DIAGNOSIS — D510 Vitamin B12 deficiency anemia due to intrinsic factor deficiency: Secondary | ICD-10-CM | POA: Diagnosis not present

## 2015-01-30 ENCOUNTER — Ambulatory Visit (INDEPENDENT_AMBULATORY_CARE_PROVIDER_SITE_OTHER): Payer: Medicare Other | Admitting: Cardiovascular Disease

## 2015-01-30 ENCOUNTER — Encounter: Payer: Self-pay | Admitting: Cardiovascular Disease

## 2015-01-30 VITALS — BP 132/80 | HR 108 | Ht 61.0 in | Wt 135.0 lb

## 2015-01-30 DIAGNOSIS — I2510 Atherosclerotic heart disease of native coronary artery without angina pectoris: Secondary | ICD-10-CM | POA: Diagnosis not present

## 2015-01-30 DIAGNOSIS — I739 Peripheral vascular disease, unspecified: Secondary | ICD-10-CM | POA: Diagnosis not present

## 2015-01-30 DIAGNOSIS — E785 Hyperlipidemia, unspecified: Secondary | ICD-10-CM | POA: Diagnosis not present

## 2015-01-30 DIAGNOSIS — I2583 Coronary atherosclerosis due to lipid rich plaque: Secondary | ICD-10-CM

## 2015-01-30 DIAGNOSIS — I10 Essential (primary) hypertension: Secondary | ICD-10-CM

## 2015-01-30 DIAGNOSIS — I6529 Occlusion and stenosis of unspecified carotid artery: Secondary | ICD-10-CM

## 2015-01-30 DIAGNOSIS — I779 Disorder of arteries and arterioles, unspecified: Secondary | ICD-10-CM

## 2015-01-30 NOTE — Patient Instructions (Signed)
Dr Allyson SabalBerry has requested that you have a lower extremity arterial duplex in 1 year. This test is an ultrasound of the arteries in the legs. It looks at arterial blood flow in the legs. Allow one hour for Lower Arterial scans. There are no restrictions or special instructions.  Please schedule your carotid dopplers today!  Dr Allyson SabalBerry recommends that you schedule a follow-up appointment in 1 year. You will receive a reminder letter in the mail two months in advance. If you don't receive a letter, please call our office to schedule the follow-up appointment.

## 2015-01-30 NOTE — Assessment & Plan Note (Signed)
History of coronary artery disease status post bypass grafting by Dr. Dorris FetchHendrickson in 2010 with a LIMA to his LAD, left radial to the first obtuse marginal branch, vein to the ramus intermedius and sequential vein to acute marginal and distal right coronary artery. Myoview performed May 2010 was nonischemic with apical scar. He had a more recent Myoview performed 08/29/13 which was nonischemic. [facility] for chest pain, rule out myocardial infarction. Enzymes were negative.

## 2015-01-30 NOTE — Assessment & Plan Note (Signed)
History of hypertension with blood pressure measured at 132/80. He is on Avapro. Continue current meds at current dosing

## 2015-01-30 NOTE — Progress Notes (Signed)
01/30/2015 Reginald Ayers   Mar 12, 1951  [id number]  Primary Physician Reginald Cowman, MD Primary Cardiologist: Reginald Gess MD Reginald Ayers   HPI:  The patient is a 64 year old, thin-appearing, single Caucasian male with no children whom I last saw 11 months ago. He has a history of CAD and PAD. I stented his proximal left common iliac artery in December 2003 and right SFA in April of 2008. He does have significant infrapopliteal disease bilaterally and known occluded left SFA. He also has an occluded right SFA by Dopplers in our office last September was earlier this month.. His ABIs were 0.48 on the right and 0.52 on the left, but he really denies claudication. He has known carotid disease with occluded left internal carotid artery and moderate right ICA stenosis, which has remained ultrasonographically stable. He is neurologically asymptomatic. His other problems include diabetes, hypertension, hyperlipidemia, and long-term tobacco abuse, smoking one-half pack per day. He had coronary artery bypass grafting by Dr. Andrey Ayers in February of 2010 with LIMA to his LAD, left radial to the first obtuse marginal branch, vein to ramus intermedius, and a sequential vein to an acute marginal and distal right coronary artery. Myoview performed in May of 2010 was nonischemic and showed apical scar consistent with his wall motion abnormality and his known occluded LAD.Dr. [name] follows his lipid profile.  Since I saw him back a year ago he's had one episode of chest pain requiring brief admission in April for rule out MI. He did have a negative Myoview 08/29/13. He had lower extremity arterial Doppler studies performed 01/07/15 revealing a right ABI 0.4 and a left 0.5 occluded SFAs bilaterally. He denies claudication. There is no evidence of critical limb ischemia. Discussed following him clinically at this time with no immediate plans for revascularization given lack of  symptoms.   Current Outpatient Prescriptions  Medication Sig Dispense Refill  . clopidogrel (PLAVIX) 75 MG tablet Take 75 mg by mouth daily.     . Cyanocobalamin (VITAMIN B-12 IJ) Inject as directed every 30 (thirty) days.    Marland Kitchen glipiZIDE (GLUCOTROL XL) 10 MG 24 hr tablet Take 10 mg by mouth 2 (two) times daily.     . irbesartan (AVAPRO) 300 MG tablet Take 300 mg by mouth daily.     Marland Kitchen JANUVIA 100 MG tablet Take 100 mg by mouth daily.     . metFORMIN (GLUCOPHAGE) 500 MG tablet Take 1,000 mg by mouth 2 (two) times daily with a meal.     . NITROSTAT 0.4 MG SL tablet Take 1 tablet by mouth as needed.  0  . pioglitazone (ACTOS) 30 MG tablet Take 30 mg by mouth daily.     Marland Kitchen PROAIR HFA 108 (90 BASE) MCG/ACT inhaler Inhale 2 puffs into the lungs every 4 (four) hours as needed.     . simvastatin (ZOCOR) 20 MG tablet Take 1 tablet (20 mg total) by mouth at bedtime. 30 tablet 9  . SPIRIVA HANDIHALER 18 MCG inhalation capsule Place 18 mcg into inhaler and inhale daily.     . traMADol (ULTRAM) 50 MG tablet Take 50 mg by mouth 3 (three) times daily.      No current facility-administered medications for this visit.    No Known Allergies  History   Social History  . Marital Status: Single    Spouse Name: N/A  . Number of Children: N/A  . Years of Education: N/A   Occupational History  . Not on file.  Social History Main Topics  . Smoking status: Former Smoker    Quit date: 01/22/2014  . Smokeless tobacco: Current User    Types: Chew  . Alcohol Use: Not on file  . Drug Use: Not on file  . Sexual Activity: Not on file   Other Topics Concern  . Not on file   Social History Narrative     Review of Systems: General: negative for chills, fever, night sweats or weight changes.  Cardiovascular: negative for chest pain, dyspnea on exertion, edema, orthopnea, palpitations, paroxysmal nocturnal dyspnea or shortness of breath Dermatological: negative for rash Respiratory: negative for cough  or wheezing Urologic: negative for hematuria Abdominal: negative for nausea, vomiting, diarrhea, bright red blood per rectum, melena, or hematemesis Neurologic: negative for visual changes, syncope, or dizziness All other systems reviewed and are otherwise negative except as noted above.    Blood pressure 132/80, pulse 108, height 5\' 1"  (1.549 m), weight 135 lb (61.236 kg).  General appearance: alert and no distress Neck: no adenopathy, no JVD, supple, symmetrical, trachea midline, thyroid not enlarged, symmetric, no tenderness/mass/nodules and soft right carotid bruit Lungs: clear to auscultation bilaterally Heart: regular rate and rhythm, S1, S2 normal, no murmur, click, rub or gallop Extremities: extremities normal, atraumatic, no cyanosis or edema  EKG not performed today  ASSESSMENT AND PLAN:   Peripheral arterial disease History of peripheral arterial disease status post remote left common iliac artery stenting by myself 08/18/02 with demonstration of moderate segmental mid left SFA disease and occluded peroneal bilaterally. He underwent right SFA stenting/14/08 with an occluded left SFA at that time and a patent left common iliac artery stent. He did have tibial vessel disease. He had recent lower extremity Dopplers performed 01/07/15 revealing a right ABI 0.4 and a left 0.5. Both SFAs were occluded. When I queried him he really denies claudication and there is no evidence of critical limb ischemia. At this point we will continue to follow him clinically.  Hyperlipidemia History of hyperlipidemia on simvastatin 20 mg a day followed by his PCP  Essential hypertension History of hypertension with blood pressure measured at 132/80. He is on Avapro. Continue current meds at current dosing  Coronary artery disease History of coronary artery disease status post bypass grafting by Dr. Dorris Ayers in 2010 with a LIMA to his LAD, left radial to the first obtuse marginal branch, vein to the  ramus intermedius and sequential vein to acute marginal and distal right coronary artery. Myoview performed May 2010 was nonischemic with apical scar. He had a more recent Myoview performed 08/29/13 which was nonischemic. [facility] for chest pain, rule out myocardial infarction. Enzymes were negative.  Carotid artery disease History of carotid artery disease with most recent Dopplers performed 6 months ago revealing an occluded left internal carotid artery with moderate right ICA stenosis scheduled to be redone in June. He is neurologically symptomatic.      Reginald GessJonathan J. Tuwanna Krausz MD FACP,FACC,FAHA, [employer] 01/30/2015 3:47 PM

## 2015-01-30 NOTE — Assessment & Plan Note (Signed)
History of hyperlipidemia on simvastatin 20 mg a day followed by his PCP 

## 2015-01-30 NOTE — Assessment & Plan Note (Signed)
History of carotid artery disease with most recent Dopplers performed 6 months ago revealing an occluded left internal carotid artery with moderate right ICA stenosis scheduled to be redone in June. He is neurologically symptomatic.

## 2015-01-30 NOTE — Assessment & Plan Note (Signed)
History of peripheral arterial disease status post remote left common iliac artery stenting by myself 08/18/02 with demonstration of moderate segmental mid left SFA disease and occluded peroneal bilaterally. He underwent right SFA stenting/14/08 with an occluded left SFA at that time and a patent left common iliac artery stent. He did have tibial vessel disease. He had recent lower extremity Dopplers performed 01/07/15 revealing a right ABI 0.4 and a left 0.5. Both SFAs were occluded. When I queried him he really denies claudication and there is no evidence of critical limb ischemia. At this point we will continue to follow him clinically.

## 2015-02-08 ENCOUNTER — Telehealth (HOSPITAL_COMMUNITY): Payer: Self-pay | Admitting: *Deleted

## 2015-02-12 ENCOUNTER — Other Ambulatory Visit: Payer: Self-pay | Admitting: Cardiovascular Disease

## 2015-02-12 DIAGNOSIS — I6529 Occlusion and stenosis of unspecified carotid artery: Secondary | ICD-10-CM

## 2015-02-20 ENCOUNTER — Ambulatory Visit (HOSPITAL_COMMUNITY)
Admission: RE | Admit: 2015-02-20 | Discharge: 2015-02-20 | Disposition: A | Discharge status: Home / Self Care | Payer: Medicare Other | Source: Ambulatory Visit | Attending: Internal Medicine | Admitting: Internal Medicine

## 2015-02-20 DIAGNOSIS — I6529 Occlusion and stenosis of unspecified carotid artery: Secondary | ICD-10-CM | POA: Insufficient documentation

## 2015-02-22 ENCOUNTER — Other Ambulatory Visit: Payer: Self-pay | Admitting: *Deleted

## 2015-02-22 DIAGNOSIS — I739 Peripheral vascular disease, unspecified: Principal | ICD-10-CM

## 2015-02-22 DIAGNOSIS — I779 Disorder of arteries and arterioles, unspecified: Secondary | ICD-10-CM

## 2015-02-26 DIAGNOSIS — D510 Vitamin B12 deficiency anemia due to intrinsic factor deficiency: Secondary | ICD-10-CM | POA: Diagnosis not present

## 2015-04-01 DIAGNOSIS — D510 Vitamin B12 deficiency anemia due to intrinsic factor deficiency: Secondary | ICD-10-CM | POA: Diagnosis not present

## 2015-04-09 ENCOUNTER — Ambulatory Visit: Payer: Federal, State, Local not specified - PPO | Admitting: Cardiovascular Disease

## 2015-04-15 ENCOUNTER — Ambulatory Visit (INDEPENDENT_AMBULATORY_CARE_PROVIDER_SITE_OTHER): Payer: Medicare Other | Admitting: Ophthalmology

## 2015-04-15 DIAGNOSIS — H26493 Other secondary cataract, bilateral: Secondary | ICD-10-CM | POA: Diagnosis not present

## 2015-04-15 DIAGNOSIS — H43813 Vitreous degeneration, bilateral: Secondary | ICD-10-CM

## 2015-04-15 DIAGNOSIS — H3531 Nonexudative age-related macular degeneration: Secondary | ICD-10-CM

## 2015-04-15 DIAGNOSIS — E11311 Type 2 diabetes mellitus with unspecified diabetic retinopathy with macular edema: Secondary | ICD-10-CM | POA: Diagnosis not present

## 2015-04-15 DIAGNOSIS — E11329 Type 2 diabetes mellitus with mild nonproliferative diabetic retinopathy without macular edema: Secondary | ICD-10-CM

## 2015-04-15 DIAGNOSIS — E11351 Type 2 diabetes mellitus with proliferative diabetic retinopathy with macular edema: Secondary | ICD-10-CM

## 2015-04-15 DIAGNOSIS — H35033 Hypertensive retinopathy, bilateral: Secondary | ICD-10-CM

## 2015-04-15 DIAGNOSIS — I10 Essential (primary) hypertension: Secondary | ICD-10-CM

## 2015-05-02 DIAGNOSIS — D510 Vitamin B12 deficiency anemia due to intrinsic factor deficiency: Secondary | ICD-10-CM | POA: Diagnosis not present

## 2015-05-06 ENCOUNTER — Ambulatory Visit (INDEPENDENT_AMBULATORY_CARE_PROVIDER_SITE_OTHER): Payer: Medicare Other | Admitting: Ophthalmology

## 2015-05-16 ENCOUNTER — Ambulatory Visit (INDEPENDENT_AMBULATORY_CARE_PROVIDER_SITE_OTHER): Payer: Medicare Other | Admitting: Ophthalmology

## 2015-05-16 DIAGNOSIS — H2702 Aphakia, left eye: Secondary | ICD-10-CM

## 2015-06-03 DIAGNOSIS — D510 Vitamin B12 deficiency anemia due to intrinsic factor deficiency: Secondary | ICD-10-CM | POA: Diagnosis not present

## 2015-07-05 DIAGNOSIS — I10 Essential (primary) hypertension: Secondary | ICD-10-CM | POA: Diagnosis not present

## 2015-07-05 DIAGNOSIS — D510 Vitamin B12 deficiency anemia due to intrinsic factor deficiency: Secondary | ICD-10-CM | POA: Diagnosis not present

## 2015-07-05 DIAGNOSIS — M5416 Radiculopathy, lumbar region: Secondary | ICD-10-CM | POA: Diagnosis not present

## 2015-07-05 DIAGNOSIS — E1151 Type 2 diabetes mellitus with diabetic peripheral angiopathy without gangrene: Secondary | ICD-10-CM | POA: Diagnosis not present

## 2015-07-05 DIAGNOSIS — Z23 Encounter for immunization: Secondary | ICD-10-CM | POA: Diagnosis not present

## 2015-07-05 DIAGNOSIS — J449 Chronic obstructive pulmonary disease, unspecified: Secondary | ICD-10-CM | POA: Diagnosis not present

## 2015-07-07 ENCOUNTER — Emergency Department (HOSPITAL_COMMUNITY): Payer: Medicare Other

## 2015-07-07 ENCOUNTER — Emergency Department (HOSPITAL_COMMUNITY)
Admission: EM | Admit: 2015-07-07 | Discharge: 2015-07-07 | Disposition: A | Discharge status: Home / Self Care | Payer: Medicare Other | Attending: Emergency Medicine | Admitting: Emergency Medicine

## 2015-07-07 ENCOUNTER — Encounter (HOSPITAL_COMMUNITY): Payer: Self-pay | Admitting: Emergency Medicine

## 2015-07-07 DIAGNOSIS — E119 Type 2 diabetes mellitus without complications: Secondary | ICD-10-CM | POA: Insufficient documentation

## 2015-07-07 DIAGNOSIS — Z9889 Other specified postprocedural states: Secondary | ICD-10-CM | POA: Diagnosis not present

## 2015-07-07 DIAGNOSIS — Z7902 Long term (current) use of antithrombotics/antiplatelets: Secondary | ICD-10-CM | POA: Diagnosis not present

## 2015-07-07 DIAGNOSIS — I2510 Atherosclerotic heart disease of native coronary artery without angina pectoris: Secondary | ICD-10-CM | POA: Insufficient documentation

## 2015-07-07 DIAGNOSIS — R069 Unspecified abnormalities of breathing: Secondary | ICD-10-CM | POA: Diagnosis not present

## 2015-07-07 DIAGNOSIS — J441 Chronic obstructive pulmonary disease with (acute) exacerbation: Secondary | ICD-10-CM | POA: Insufficient documentation

## 2015-07-07 DIAGNOSIS — R000 Tachycardia, unspecified: Secondary | ICD-10-CM | POA: Insufficient documentation

## 2015-07-07 DIAGNOSIS — R0600 Dyspnea, unspecified: Secondary | ICD-10-CM | POA: Insufficient documentation

## 2015-07-07 DIAGNOSIS — R600 Localized edema: Secondary | ICD-10-CM | POA: Diagnosis not present

## 2015-07-07 DIAGNOSIS — I10 Essential (primary) hypertension: Secondary | ICD-10-CM | POA: Diagnosis not present

## 2015-07-07 DIAGNOSIS — E785 Hyperlipidemia, unspecified: Secondary | ICD-10-CM | POA: Diagnosis not present

## 2015-07-07 DIAGNOSIS — Z79899 Other long term (current) drug therapy: Secondary | ICD-10-CM | POA: Diagnosis not present

## 2015-07-07 DIAGNOSIS — Z87891 Personal history of nicotine dependence: Secondary | ICD-10-CM | POA: Diagnosis not present

## 2015-07-07 DIAGNOSIS — R0602 Shortness of breath: Secondary | ICD-10-CM | POA: Diagnosis present

## 2015-07-07 DIAGNOSIS — R609 Edema, unspecified: Secondary | ICD-10-CM

## 2015-07-07 LAB — CBC WITH DIFFERENTIAL/PLATELET
Basophils Absolute: 0 10*3/uL (ref 0.0–0.1)
Basophils Relative: 0 %
Eosinophils Absolute: 0.1 10*3/uL (ref 0.0–0.7)
Eosinophils Relative: 2 %
HCT: 34.9 % — ABNORMAL LOW (ref 39.0–52.0)
HEMOGLOBIN: 11.5 g/dL — AB (ref 13.0–17.0)
LYMPHS ABS: 1.6 10*3/uL (ref 0.7–4.0)
LYMPHS PCT: 20 %
MCH: 30.6 pg (ref 26.0–34.0)
MCHC: 33 g/dL (ref 30.0–36.0)
MCV: 92.8 fL (ref 78.0–100.0)
MONOS PCT: 11 %
Monocytes Absolute: 0.9 10*3/uL (ref 0.1–1.0)
NEUTROS PCT: 67 %
Neutro Abs: 5.4 10*3/uL (ref 1.7–7.7)
Platelets: 235 10*3/uL (ref 150–400)
RBC: 3.76 MIL/uL — AB (ref 4.22–5.81)
RDW: 14 % (ref 11.5–15.5)
WBC: 8 10*3/uL (ref 4.0–10.5)

## 2015-07-07 LAB — BASIC METABOLIC PANEL
ANION GAP: 11 (ref 5–15)
BUN: 13 mg/dL (ref 6–20)
CO2: 24 mmol/L (ref 22–32)
Calcium: 9.3 mg/dL (ref 8.9–10.3)
Chloride: 99 mmol/L — ABNORMAL LOW (ref 101–111)
Creatinine, Ser: 0.78 mg/dL (ref 0.61–1.24)
Glucose, Bld: 223 mg/dL — ABNORMAL HIGH (ref 65–99)
POTASSIUM: 4 mmol/L (ref 3.5–5.1)
SODIUM: 134 mmol/L — AB (ref 135–145)

## 2015-07-07 LAB — URINALYSIS, ROUTINE W REFLEX MICROSCOPIC
BILIRUBIN URINE: NEGATIVE
Glucose, UA: 250 mg/dL — AB
Hgb urine dipstick: NEGATIVE
Ketones, ur: NEGATIVE mg/dL
Leukocytes, UA: NEGATIVE
NITRITE: NEGATIVE
Protein, ur: NEGATIVE mg/dL
Specific Gravity, Urine: 1.012 (ref 1.005–1.030)
UROBILINOGEN UA: 0.2 mg/dL (ref 0.0–1.0)
pH: 5.5 (ref 5.0–8.0)

## 2015-07-07 LAB — TROPONIN I: Troponin I: <0.03 ng/mL (ref <0.031)

## 2015-07-07 LAB — BRAIN NATRIURETIC PEPTIDE: B NATRIURETIC PEPTIDE 5: 131.3 pg/mL — AB (ref 0.0–100.0)

## 2015-07-07 IMAGING — CR DG CHEST 2V
2 series · 2 of 2 positions shown · non-contrast
Comparison: PA and lateral chest [DATE] and [DATE].

CLINICAL DATA: Severe shortness of breath this morning.  Smoker.

EXAM:
CHEST  2 VIEW

[chest lat]
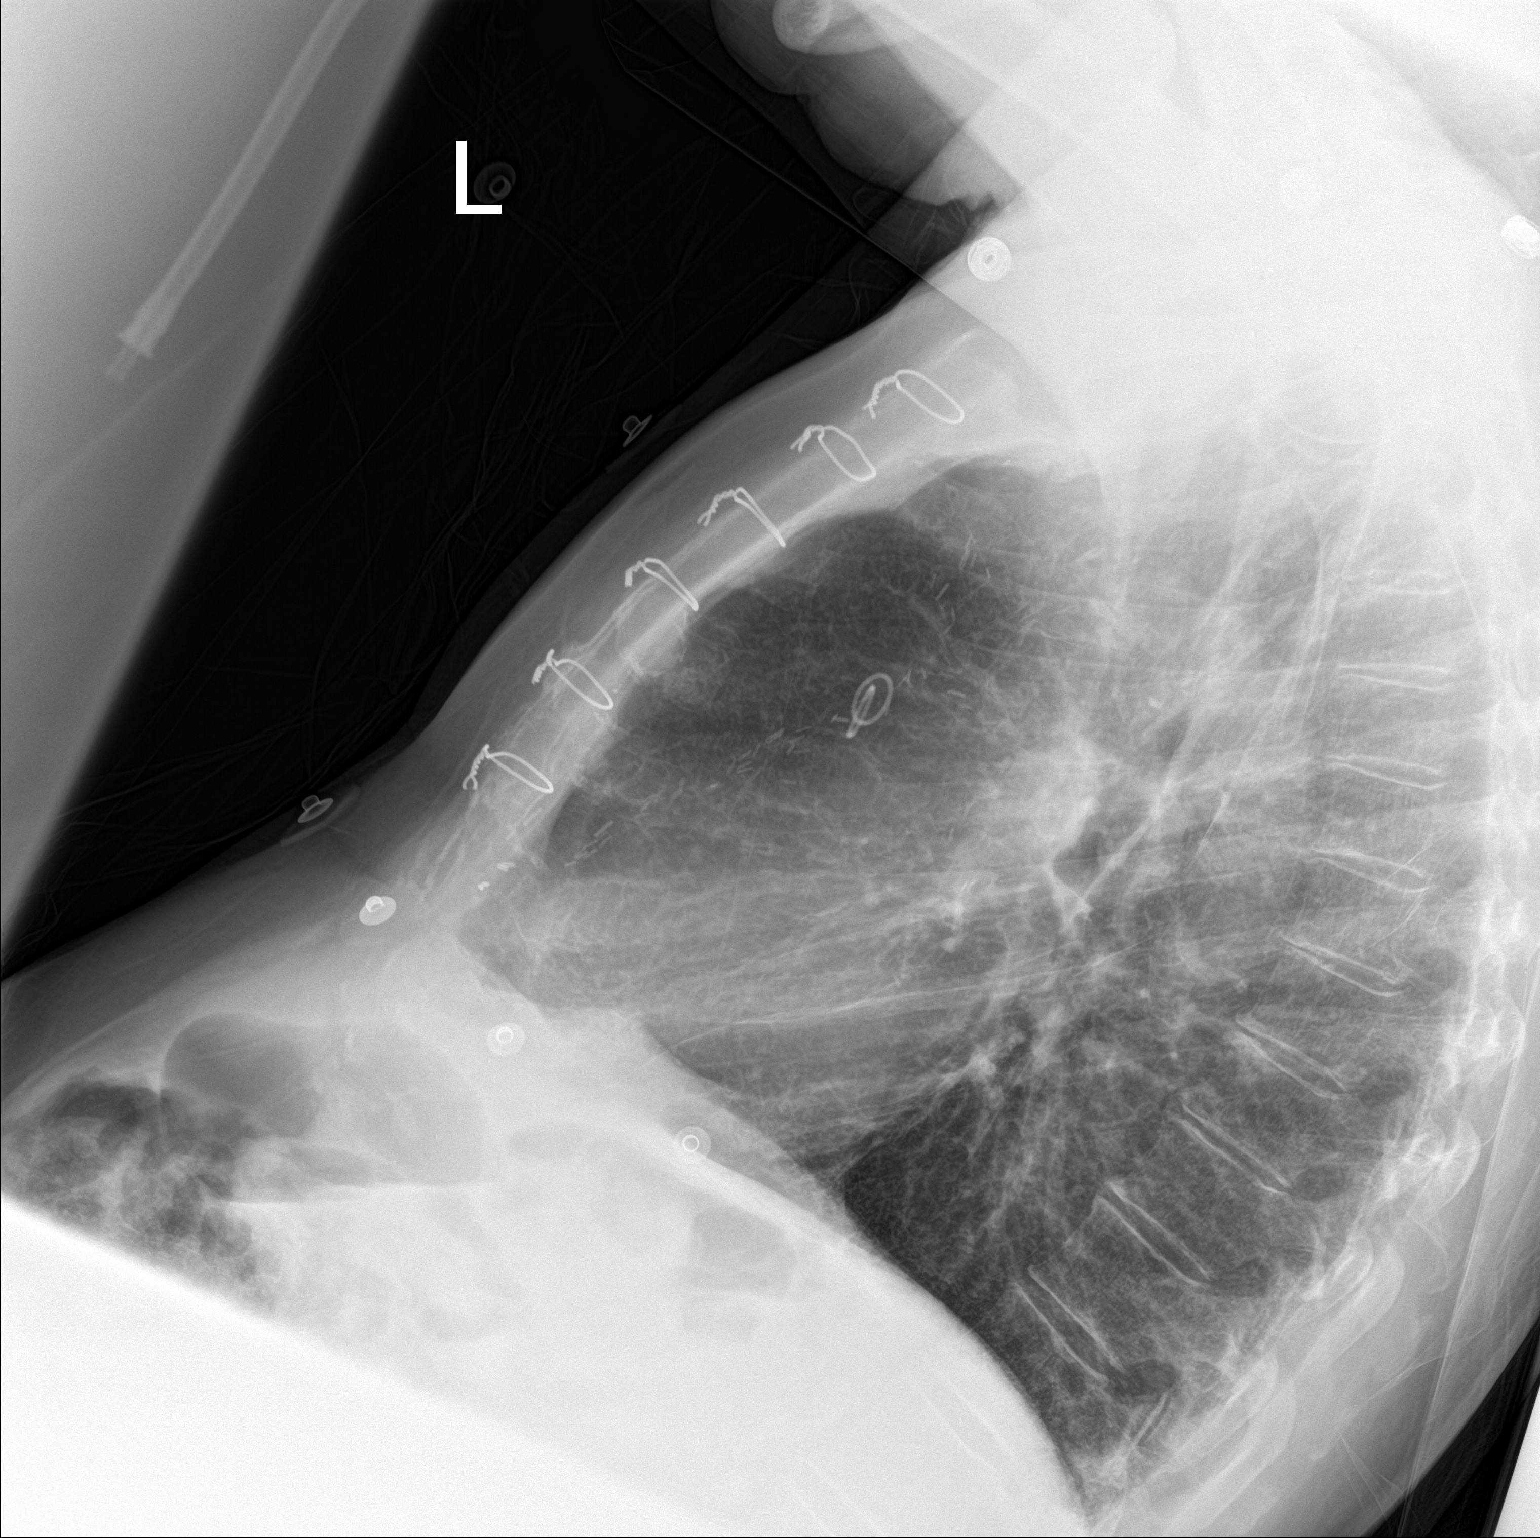

[chest ap]
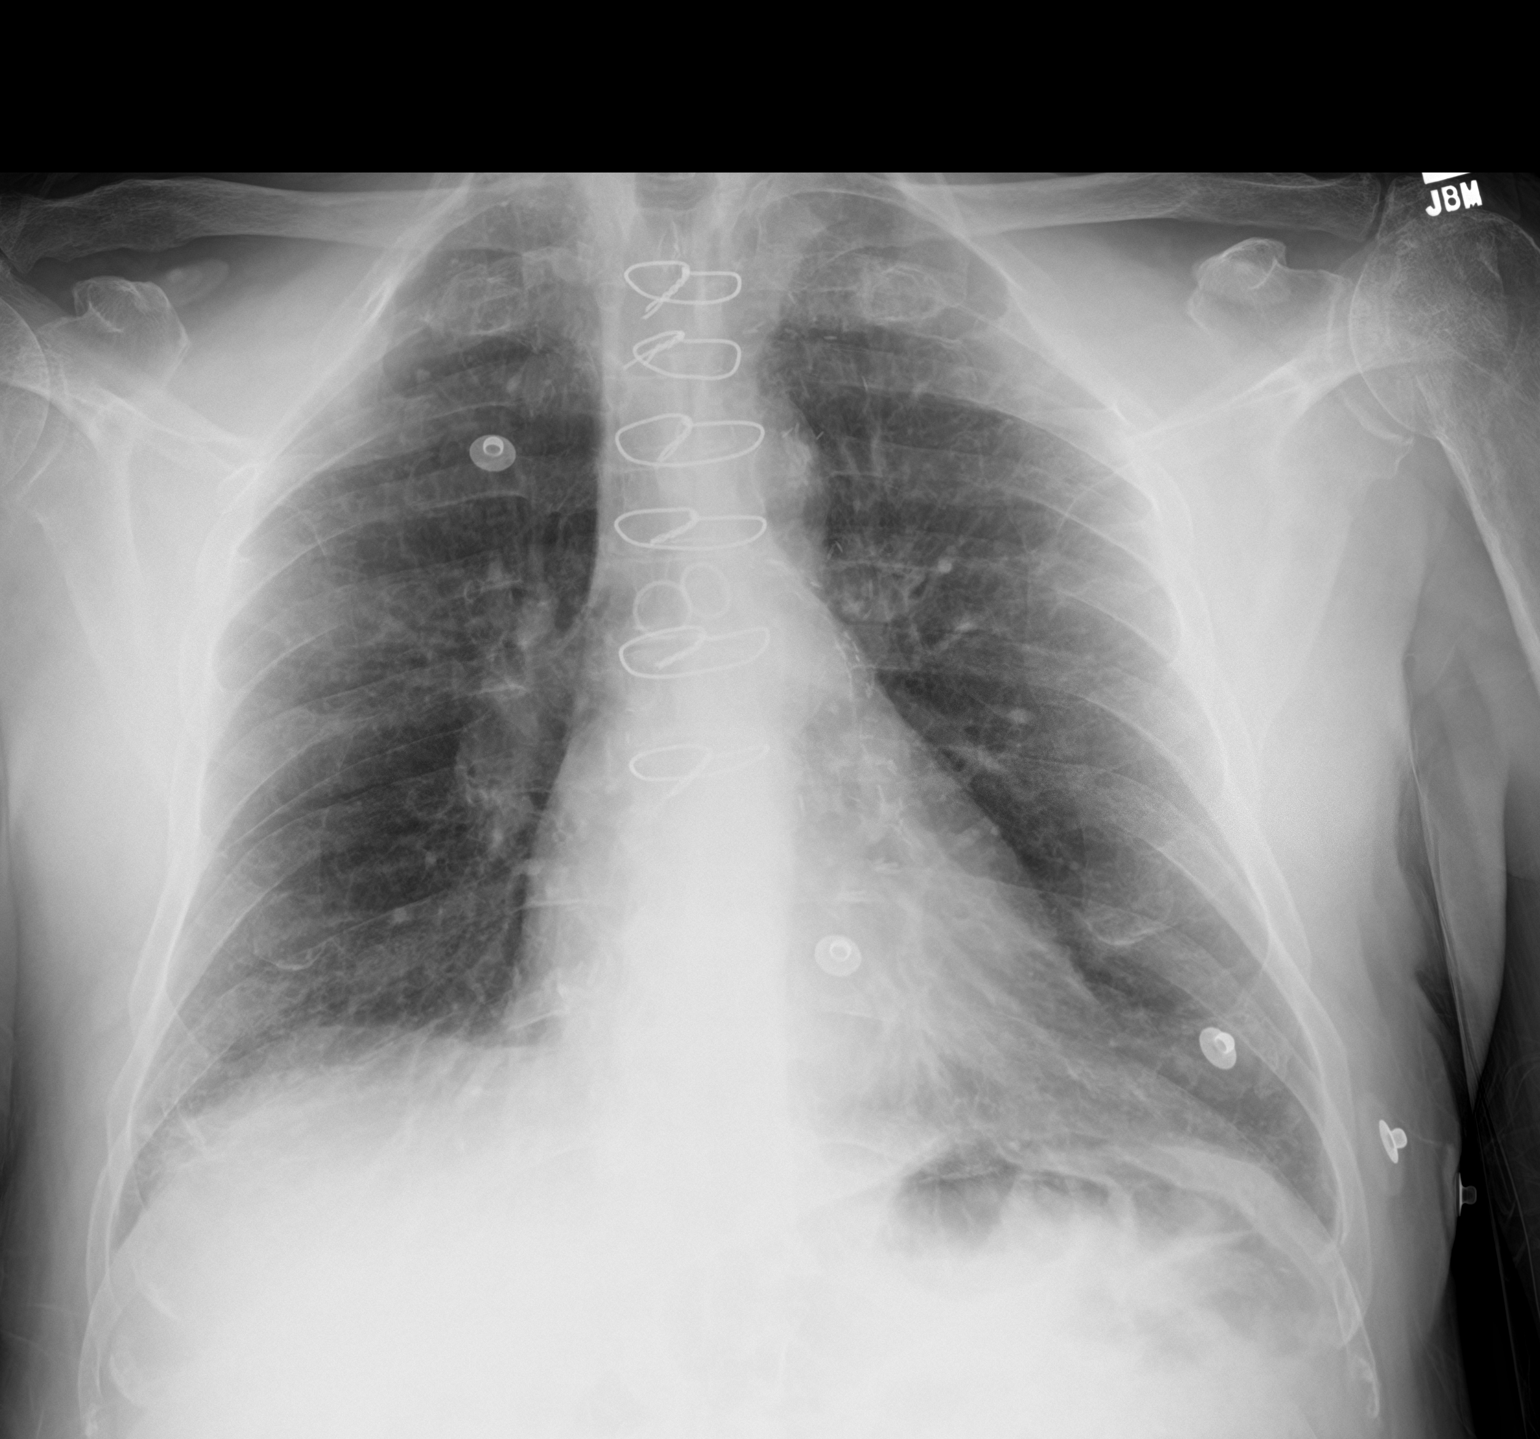

[2 of 2 positions shown; findings below may reference images not displayed]

FINDINGS: The patient is status post CABG. The chest is hyperexpanded. There
is some coarsening of the pulmonary interstitium. No focal airspace
disease identified. No pneumothorax or pleural effusion.
IMPRESSION: COPD without acute disease.

## 2015-07-07 MED ORDER — ASPIRIN 81 MG PO CHEW: 324.0000 mg | CHEWABLE_TABLET | Freq: Once | ORAL | Status: DC

## 2015-07-07 MED ORDER — PREDNISONE 10 MG PO TABS: 60.0000 mg | ORAL_TABLET | Freq: Every day | ORAL | Status: DC

## 2015-07-07 NOTE — ED Notes (Signed)
Patient comes from home with complaints of SOB. Per EMS patient had had wheezing throughout. Patient given 5mg  of Abuterol. Patient denies any SOB n/v and chest pain. Patient states thought the pain was related to his chest and took 324 ASA and 14 nitro and denied any relief. Patient denies any complaints on arrival. Patient medication provided relief. Cough present. Patient states baseline.

## 2015-07-07 NOTE — ED Notes (Signed)
Pt left with all his belongings and ambulated out of the treatment area.  

## 2015-07-07 NOTE — ED Provider Notes (Addendum)
CSN: [id number]     Arrival date & time 07/07/15  1503 History   First MD Initiated Contact with Patient 07/07/15 1509     Chief Complaint  Patient presents with  . Shortness of Breath     (Consider location/radiation/quality/duration/timing/severity/associated sxs/prior Treatment) HPI Comments: Pt with hx of COPD, CAD, PAD comes in with cc of dib. PT reports that he started having sudden onset dyspnea, chest tightness and wheezing while he was watching TV. Pt took breathing tx at home, with no relief and called EMS. Pt took nitro at home w/o relief as well. EMS reported diffuse wheezing and pt was given nebs- and pt reports now that the tightness has resolved and his breathing is way better. Denies new cough, new phlegm. Pt is a smoker. No sick contacts.    ROS 10 Systems reviewed and are negative for acute change except as noted in the HPI.     Patient is a 64 y.o. male presenting with shortness of breath. The history is provided by the patient.  Shortness of Breath   Past Medical History  Diagnosis Date  . Coronary artery disease     sstatus post coronary artery bypass grafting February 2010 by Dr. Merilynn Finland  . Peripheral arterial disease ([place])     post left common iliac and right SFA stenting remotely  . Carotid artery disease ([place])   . Diabetes ([place])   . Hypertension   . Hyperlipidemia   . Tobacco abuse     recently discontinued   Past Surgical History  Procedure Laterality Date  . Cardiac catheterization  2010  . Carotid angiogram  2014  . Doppler echocardiography  2010  . Nm myoview ltd  2010  . Lower extremity angiogram  11/2006    left common femoral endarterectomy and patch angioplasty:The mid right SFA was angioplastied with a 4 x 8  . Lower extremity angiogram  2003    left common iliac artery stenting by Dr. Adora Fridge back in 2003    Family History  Problem Relation Age of Onset  . Heart attack Mother   . CVA Mother   . Diabetes Sister   .  Hypertension Sister    Social History  Substance Use Topics  . Smoking status: Former Smoker    Quit date: 01/22/2014  . Smokeless tobacco: Current User    Types: Chew  . Alcohol Use: None    Review of Systems  Respiratory: Positive for shortness of breath.       Allergies  Review of patient's allergies indicates no known allergies.  Home Medications   Prior to Admission medications   Medication Sig Start Date End Date Taking? Authorizing Provider  clopidogrel (PLAVIX) 75 MG tablet Take 75 mg by mouth daily.  07/24/13  Yes Historical Provider, MD  Cyanocobalamin (VITAMIN B-12 IJ) Inject 1 application as directed every 30 (thirty) days.    Yes Historical Provider, MD  glipiZIDE (GLUCOTROL XL) 10 MG 24 hr tablet Take 10 mg by mouth 2 (two) times daily.  07/24/13  Yes Historical Provider, MD  ibuprofen (ADVIL,MOTRIN) 200 MG tablet Take 600-800 mg by mouth every 6 (six) hours as needed for moderate pain.    Yes Historical Provider, MD  Influenza vac split quadrivalent PF (FLUARIX) 0.5 ML injection Inject 0.5 mLs into the muscle once.   Yes Historical Provider, MD  irbesartan (AVAPRO) 300 MG tablet Take 300 mg by mouth daily.  07/24/13  Yes Historical Provider, MD  metFORMIN (GLUCOPHAGE) 500 MG  tablet Take 1,000 mg by mouth 2 (two) times daily with a meal.  07/24/13  Yes Historical Provider, MD  naproxen sodium (ANAPROX) 220 MG tablet Take 220 mg by mouth 2 (two) times daily as needed (for pain, takes).   Yes Historical Provider, MD  NITROSTAT 0.4 MG SL tablet Take 1 tablet by mouth as needed for chest pain.  12/18/14  Yes Historical Provider, MD  pioglitazone (ACTOS) 30 MG tablet Take 30 mg by mouth daily.  07/24/13  Yes Historical Provider, MD  PROAIR HFA 108 (90 BASE) MCG/ACT inhaler Inhale 2 puffs into the lungs every 4 (four) hours as needed.  07/24/13  Yes Historical Provider, MD  SPIRIVA HANDIHALER 18 MCG inhalation capsule Place 18 mcg into inhaler and inhale daily.  08/16/13  Yes  Historical Provider, MD  traMADol (ULTRAM) 50 MG tablet Take 50 mg by mouth every 12 (twelve) hours as needed for severe pain.  08/16/13  Yes Historical Provider, MD  simvastatin (ZOCOR) 20 MG tablet Take 1 tablet (20 mg total) by mouth at bedtime. Patient not taking: Reported on 07/07/2015 01/01/15   Isaiah Serge, NP   BP 140/64 mmHg  Pulse 94  Resp 19  SpO2 96% Physical Exam  Constitutional: He is oriented to person, place, and time. He appears well-developed.  HENT:  Head: Normocephalic and atraumatic.  Eyes: Conjunctivae and EOM are normal. Pupils are equal, round, and reactive to light.  Neck: Normal range of motion. Neck supple. No JVD present.  Cardiovascular: Regular rhythm and normal heart sounds.   Pulmonary/Chest: Effort normal. No respiratory distress. He has wheezes.  Abdominal: Soft. Bowel sounds are normal. He exhibits no distension. There is no tenderness. There is no rebound and no guarding.  Musculoskeletal: He exhibits edema.  1+ pitting edema bilaterally  Neurological: He is alert and oriented to person, place, and time.  Skin: Skin is warm.  Nursing note and vitals reviewed.   ED Course  Procedures (including critical care time) Labs Review Labs Reviewed  CBC WITH DIFFERENTIAL/PLATELET - Abnormal; Notable for the following:    RBC 3.76 (*)    Hemoglobin 11.5 (*)    HCT 34.9 (*)    All other components within normal limits  BASIC METABOLIC PANEL - Abnormal; Notable for the following:    Sodium 134 (*)    Chloride 99 (*)    Glucose, Bld 223 (*)    All other components within normal limits  URINALYSIS, ROUTINE W REFLEX MICROSCOPIC (NOT AT [facility]) - Abnormal; Notable for the following:    Glucose, UA 250 (*)    All other components within normal limits  BRAIN NATRIURETIC PEPTIDE - Abnormal; Notable for the following:    B Natriuretic Peptide 131.3 (*)    All other components within normal limits  TROPONIN I    Imaging Review Dg Chest 2  View  07/07/2015  CLINICAL DATA:  Severe shortness of breath this morning.  Smoker. EXAM: CHEST  2 VIEW COMPARISON:  PA and lateral chest 12/06/2014 and 08/08/2012. FINDINGS: The patient is status post CABG. The chest is hyperexpanded. There is some coarsening of the pulmonary interstitium. No focal airspace disease identified. No pneumothorax or pleural effusion. IMPRESSION: COPD without acute disease. Electronically Signed   By: Inge Rise M.D.   On: 07/07/2015 16:23   I have personally reviewed and evaluated these images and lab results as part of my medical decision-making.   ED ECG REPORT   Date: 07/07/2015  Rate: 118  Rhythm:  sinus tachycardia  QRS Axis: normal  Intervals: normal  ST/T Wave abnormalities: nonspecific ST/T changes  Conduction Disutrbances:none  Narrative Interpretation:   Old EKG Reviewed: no acute changes  I have personally reviewed the EKG tracing and agree with the computerized printout as noted.     EKG Interpretation   Date/Time:  Sunday July 07 2015 19:24:52 EST Ventricular Rate:  90 PR Interval:  199 QRS Duration: 119 QT Interval:  361 QTC Calculation: 442 R Axis:   102 Text Interpretation:  Sinus rhythm IRBBB and LPFB Anteroseptal infarct,  age indeterminate No acute changes Confirmed by Kathrynn Humble, MD, Thelma Comp  [phone]) on 07/07/2015 7:42:54 PM      MDM   Final diagnoses:  Dyspnea    Pt is coming in with DIB. He is tachycardic, no tachypnea. BP is slightly elevated. Pt's lung show mild wheezing, air movement is slightly diminished diffusely, likely due to his COPD.  Pt has CAD hx - doesn't appear to be ACS type picture currently.  He is tachycardic - ddx includes PE - given there is increased hx of clot formation and he is smoker. Pt has bilateral leg swelling. He has leg pain on the R side. ? DVT. Will reassess and decide if dimer is needed.   '@7' :30: Pt feels a lot better, Lung exam is clear. HR in the 90s. US DVT ordered,  WELLS score > 1 for leg swelling and pain - and the sx dont appear to be claudication type. If workup neg, will be discharged.  '@9' :30 - ambulated, outpatient DVT study ordered, ret precautions discussed.  Varney Biles, MD 07/07/15 1945  Varney Biles, MD 07/07/15 2157

## 2015-07-07 NOTE — ED Notes (Signed)
Start: HR- 100 bpm SpO2- 98% Ambulated Approx 23800ft HR- 129 bpm SpO2- 92% 1 minute post ambulation HR- 110 bpm SpO2- 105

## 2015-07-07 NOTE — Discharge Instructions (Signed)
We saw you in the ER for the leg pain and the shortness of breath. All the results in the ER are normal, labs and imaging. We are not sure what is causing your symptoms. The workup in the ER is not complete, and is limited to screening for life threatening and emergent conditions only, so please see a primary care doctor for further evaluation.  PLEASE EXPECT A CALL FROM THE ULTRASOUND TEAM AT [facility] TO GET YOUR LEGS CHECKED OUT FOR VEIN CLOTS. YOU HAVE CLOTS IN THE ARTERY ALREADY THAT THE VASCULAR DOCTOR IS MANAGING, WE WANT TO MAKE SURE THERE ARE NO CLOTS IN THE VEIN.  Please return to the ER if you have worsening chest pain, shortness of breath, pain radiating to your jaw, shoulder, or back, sweats or fainting. Otherwise see the Cardiologist or your primary care doctor as requested.     Shortness of Breath Shortness of breath means you have trouble breathing. It could also mean that you have a medical problem. You should get immediate medical care for shortness of breath. CAUSES   Not enough oxygen in the air such as with high altitudes or a smoke-filled room.  Certain lung diseases, infections, or problems.  Heart disease or conditions, such as angina or heart failure.  Low red blood cells (anemia).  Poor physical fitness, which can cause shortness of breath when you exercise.  Chest or back injuries or stiffness.  Being overweight.  Smoking.  Anxiety, which can make you feel like you are not getting enough air. DIAGNOSIS  Serious medical problems can often be found during your physical exam. Tests may also be done to determine why you are having shortness of breath. Tests may include:  Chest X-rays.  Lung function tests.  Blood tests.  An electrocardiogram (ECG).  An ambulatory electrocardiogram. An ambulatory ECG records your heartbeat patterns over a 24-hour period.  Exercise testing.  A transthoracic echocardiogram (TTE). During echocardiography, sound waves are  used to evaluate how blood flows through your heart.  A transesophageal echocardiogram (TEE).  Imaging scans. Your health care provider may not be able to find a cause for your shortness of breath after your exam. In this case, it is important to have a follow-up exam with your health care provider as directed.  TREATMENT  Treatment for shortness of breath depends on the cause of your symptoms and can vary greatly. HOME CARE INSTRUCTIONS   Do not smoke. Smoking is a common cause of shortness of breath. If you smoke, ask for help to quit.  Avoid being around chemicals or things that may bother your breathing, such as paint fumes and dust.  Rest as needed. Slowly resume your usual activities.  If medicines were prescribed, take them as directed for the full length of time directed. This includes oxygen and any inhaled medicines.  Keep all follow-up appointments as directed by your health care provider. SEEK MEDICAL CARE IF:   Your condition does not improve in the time expected.  You have a hard time doing your normal activities even with rest.  You have any new symptoms. SEEK IMMEDIATE MEDICAL CARE IF:   Your shortness of breath gets worse.  You feel light-headed, faint, or develop a cough not controlled with medicines.  You start coughing up blood.  You have pain with breathing.  You have chest pain or pain in your arms, shoulders, or abdomen.  You have a fever.  You are unable to walk up stairs or exercise the way you  normally do. MAKE SURE YOU:  Understand these instructions.  Will watch your condition.  Will get help right away if you are not doing well or get worse.   This information is not intended to replace advice given to you by your health care provider. Make sure you discuss any questions you have with your health care provider.   Document Released: 05/05/2001 Document Revised: 08/15/2013 Document Reviewed: 10/26/2011 Elsevier Interactive Patient  Education [employer]

## 2015-07-08 ENCOUNTER — Encounter (HOSPITAL_COMMUNITY): Payer: Self-pay | Admitting: Emergency Medicine

## 2015-07-08 ENCOUNTER — Emergency Department (HOSPITAL_COMMUNITY): Payer: Medicare Other

## 2015-07-08 ENCOUNTER — Observation Stay (HOSPITAL_COMMUNITY)
Admission: EM | Admit: 2015-07-08 | Discharge: 2015-07-09 | Disposition: A | Discharge status: Home / Self Care | Payer: Medicare Other | Attending: Internal Medicine | Admitting: Internal Medicine

## 2015-07-08 DIAGNOSIS — I10 Essential (primary) hypertension: Secondary | ICD-10-CM | POA: Diagnosis not present

## 2015-07-08 DIAGNOSIS — Z7902 Long term (current) use of antithrombotics/antiplatelets: Secondary | ICD-10-CM | POA: Diagnosis not present

## 2015-07-08 DIAGNOSIS — J440 Chronic obstructive pulmonary disease with acute lower respiratory infection: Secondary | ICD-10-CM | POA: Insufficient documentation

## 2015-07-08 DIAGNOSIS — I779 Disorder of arteries and arterioles, unspecified: Secondary | ICD-10-CM | POA: Diagnosis present

## 2015-07-08 DIAGNOSIS — I739 Peripheral vascular disease, unspecified: Secondary | ICD-10-CM | POA: Diagnosis not present

## 2015-07-08 DIAGNOSIS — F17200 Nicotine dependence, unspecified, uncomplicated: Secondary | ICD-10-CM | POA: Diagnosis not present

## 2015-07-08 DIAGNOSIS — E871 Hypo-osmolality and hyponatremia: Secondary | ICD-10-CM | POA: Diagnosis not present

## 2015-07-08 DIAGNOSIS — Z8249 Family history of ischemic heart disease and other diseases of the circulatory system: Secondary | ICD-10-CM | POA: Diagnosis not present

## 2015-07-08 DIAGNOSIS — R079 Chest pain, unspecified: Secondary | ICD-10-CM | POA: Diagnosis not present

## 2015-07-08 DIAGNOSIS — Z7984 Long term (current) use of oral hypoglycemic drugs: Secondary | ICD-10-CM | POA: Insufficient documentation

## 2015-07-08 DIAGNOSIS — Z951 Presence of aortocoronary bypass graft: Secondary | ICD-10-CM | POA: Diagnosis not present

## 2015-07-08 DIAGNOSIS — R0602 Shortness of breath: Secondary | ICD-10-CM | POA: Insufficient documentation

## 2015-07-08 DIAGNOSIS — J441 Chronic obstructive pulmonary disease with (acute) exacerbation: Secondary | ICD-10-CM | POA: Insufficient documentation

## 2015-07-08 DIAGNOSIS — E785 Hyperlipidemia, unspecified: Secondary | ICD-10-CM | POA: Diagnosis not present

## 2015-07-08 DIAGNOSIS — R069 Unspecified abnormalities of breathing: Secondary | ICD-10-CM | POA: Diagnosis not present

## 2015-07-08 DIAGNOSIS — R0789 Other chest pain: Secondary | ICD-10-CM | POA: Diagnosis not present

## 2015-07-08 DIAGNOSIS — Z720 Tobacco use: Secondary | ICD-10-CM | POA: Diagnosis present

## 2015-07-08 DIAGNOSIS — I2510 Atherosclerotic heart disease of native coronary artery without angina pectoris: Secondary | ICD-10-CM | POA: Insufficient documentation

## 2015-07-08 DIAGNOSIS — Z87891 Personal history of nicotine dependence: Secondary | ICD-10-CM | POA: Diagnosis not present

## 2015-07-08 DIAGNOSIS — E119 Type 2 diabetes mellitus without complications: Secondary | ICD-10-CM | POA: Insufficient documentation

## 2015-07-08 LAB — COMPREHENSIVE METABOLIC PANEL
ALBUMIN: 3.8 g/dL (ref 3.5–5.0)
ALT: 16 U/L — ABNORMAL LOW (ref 17–63)
AST: 19 U/L (ref 15–41)
Alkaline Phosphatase: 58 U/L (ref 38–126)
Anion gap: 8 (ref 5–15)
BUN: 11 mg/dL (ref 6–20)
CHLORIDE: 102 mmol/L (ref 101–111)
CO2: 25 mmol/L (ref 22–32)
Calcium: 9.4 mg/dL (ref 8.9–10.3)
Creatinine, Ser: 0.71 mg/dL (ref 0.61–1.24)
GFR calc Af Amer: >60 mL/min (ref >60)
Glucose, Bld: 262 mg/dL — ABNORMAL HIGH (ref 65–99)
POTASSIUM: 4.2 mmol/L (ref 3.5–5.1)
Sodium: 135 mmol/L (ref 135–145)
Total Bilirubin: 0.2 mg/dL — ABNORMAL LOW (ref 0.3–1.2)
Total Protein: 6.5 g/dL (ref 6.5–8.1)

## 2015-07-08 LAB — CBC WITH DIFFERENTIAL/PLATELET
BASOS ABS: 0 10*3/uL (ref 0.0–0.1)
BASOS PCT: 0 %
EOS ABS: 0.1 10*3/uL (ref 0.0–0.7)
EOS PCT: 1 %
HCT: 33.9 % — ABNORMAL LOW (ref 39.0–52.0)
Hemoglobin: 11.3 g/dL — ABNORMAL LOW (ref 13.0–17.0)
Lymphocytes Relative: 16 %
Lymphs Abs: 1.2 10*3/uL (ref 0.7–4.0)
MCH: 30.7 pg (ref 26.0–34.0)
MCHC: 33.3 g/dL (ref 30.0–36.0)
MCV: 92.1 fL (ref 78.0–100.0)
Monocytes Absolute: 0.6 10*3/uL (ref 0.1–1.0)
Monocytes Relative: 8 %
Neutro Abs: 5.6 10*3/uL (ref 1.7–7.7)
Neutrophils Relative %: 75 %
PLATELETS: 216 10*3/uL (ref 150–400)
RBC: 3.68 MIL/uL — AB (ref 4.22–5.81)
RDW: 14 % (ref 11.5–15.5)
WBC: 7.5 10*3/uL (ref 4.0–10.5)

## 2015-07-08 LAB — GLUCOSE, CAPILLARY
GLUCOSE-CAPILLARY: 144 mg/dL — AB (ref 65–99)
GLUCOSE-CAPILLARY: 232 mg/dL — AB (ref 65–99)
Glucose-Capillary: 130 mg/dL — ABNORMAL HIGH (ref 65–99)
Glucose-Capillary: 175 mg/dL — ABNORMAL HIGH (ref 65–99)

## 2015-07-08 LAB — BRAIN NATRIURETIC PEPTIDE: B NATRIURETIC PEPTIDE 5: 260.4 pg/mL — AB (ref 0.0–100.0)

## 2015-07-08 LAB — TROPONIN I: Troponin I: <0.03 ng/mL (ref <0.031)

## 2015-07-08 LAB — I-STAT TROPONIN, ED: TROPONIN I, POC: 0.01 ng/mL (ref 0.00–0.08)

## 2015-07-08 IMAGING — CR DG CHEST 1V PORT
1 series · 1 of 1 positions shown · non-contrast
Comparison: Frontal lateral views 1 day prior.

CLINICAL DATA: Shortness of breath, central chest pressure.

EXAM:
PORTABLE CHEST 1 VIEW

[AP]
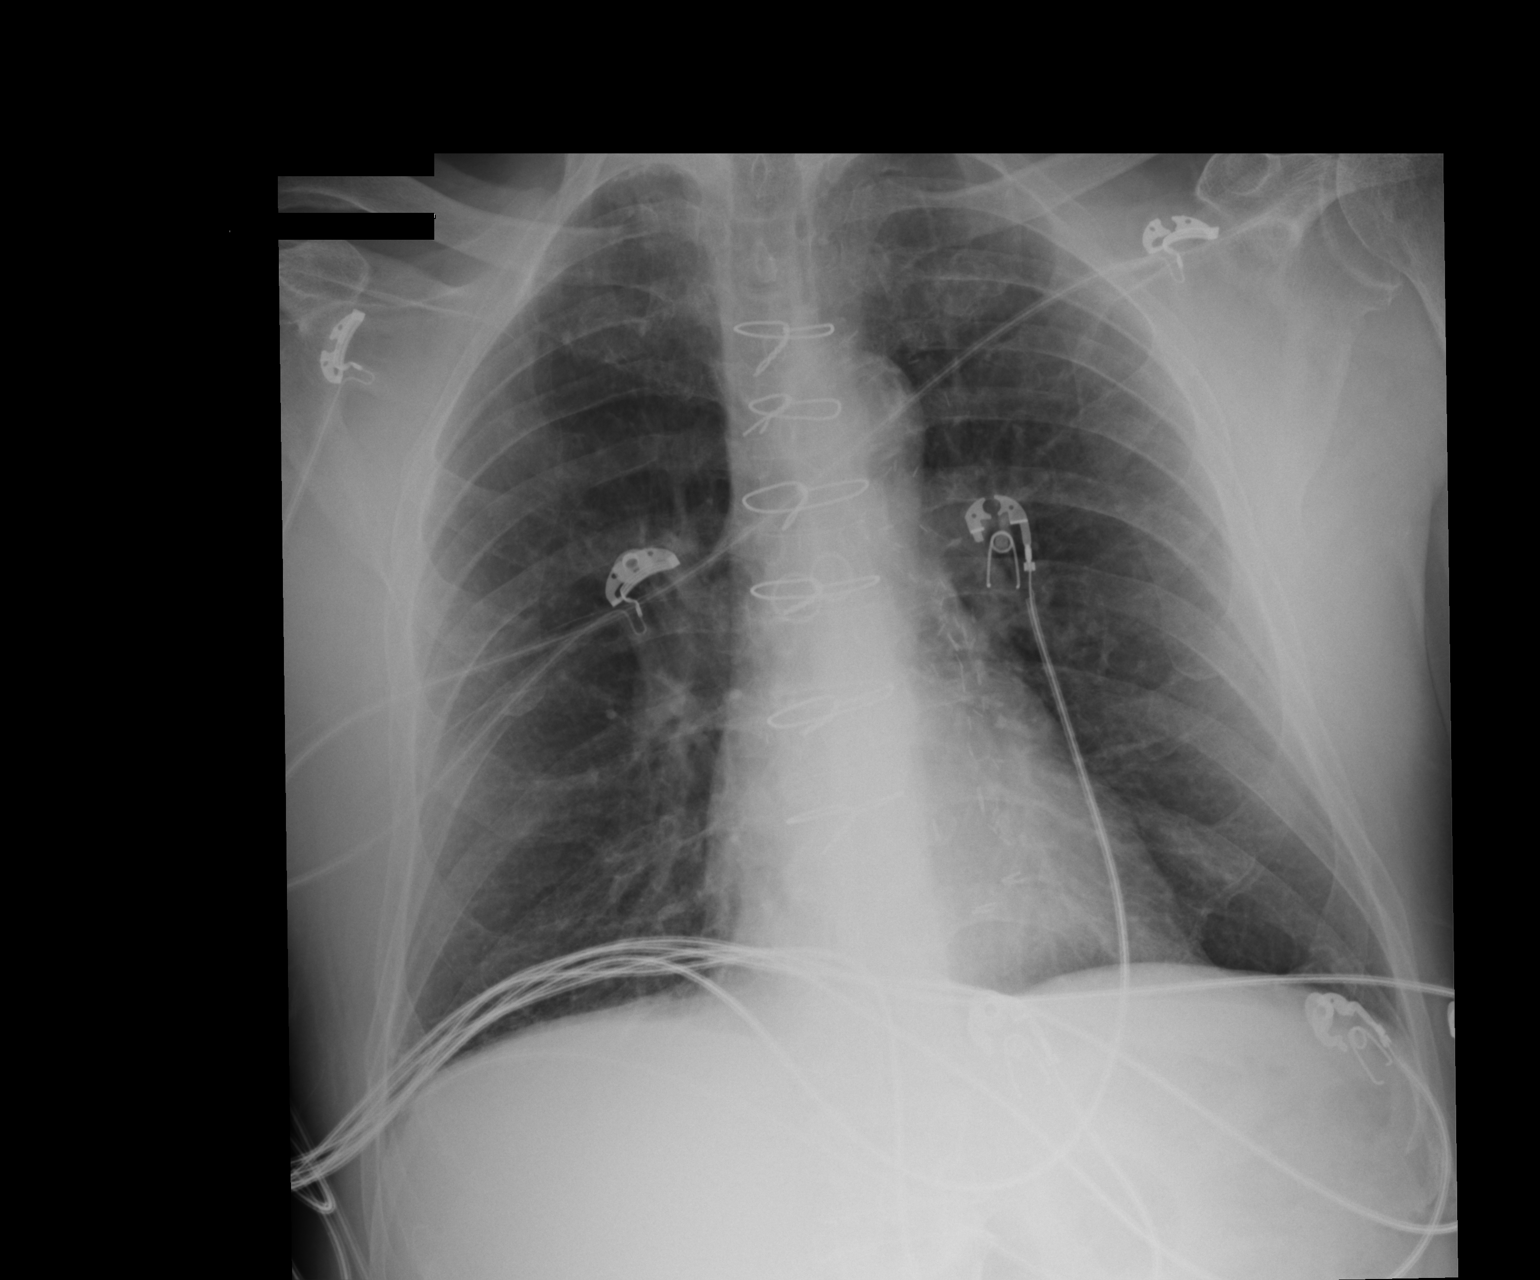

[1 of 1 positions shown; findings below may reference images not displayed]

FINDINGS: Lungs remain hyperinflated. Patient is post median sternotomy. The
heart size is normal. Pulmonary vasculature is normal. There is no
consolidation, pulmonary edema, pleural effusion or pneumothorax.
IMPRESSION: Stable hyperinflation.  No superimposed acute process.

## 2015-07-08 MED ORDER — NAPROXEN 250 MG PO TABS: 250.0000 mg | ORAL_TABLET | Freq: Two times a day (BID) | ORAL | Status: DC | PRN

## 2015-07-08 MED ORDER — ENOXAPARIN SODIUM 40 MG/0.4ML ~~LOC~~ SOLN
40.0000 mg | SUBCUTANEOUS | Status: DC
  Administered 2015-07-08 – 2015-07-09 (×2): 40 mg via SUBCUTANEOUS
  Filled 2015-07-08 (×2): qty 0.4

## 2015-07-08 MED ORDER — LEVOFLOXACIN 750 MG PO TABS
750.0000 mg | ORAL_TABLET | Freq: Every day | ORAL | Status: DC
  Administered 2015-07-08 – 2015-07-09 (×2): 750 mg via ORAL
  Filled 2015-07-08 (×2): qty 1

## 2015-07-08 MED ORDER — ACETAMINOPHEN 325 MG PO TABS: 650.0000 mg | ORAL_TABLET | ORAL | Status: DC | PRN

## 2015-07-08 MED ORDER — CLOPIDOGREL BISULFATE 75 MG PO TABS
75.0000 mg | ORAL_TABLET | Freq: Every day | ORAL | Status: DC
  Administered 2015-07-08 – 2015-07-09 (×2): 75 mg via ORAL
  Filled 2015-07-08 (×2): qty 1

## 2015-07-08 MED ORDER — PREDNISONE 20 MG PO TABS
60.0000 mg | ORAL_TABLET | Freq: Every day | ORAL | Status: DC
  Administered 2015-07-08 – 2015-07-09 (×2): 60 mg via ORAL
  Filled 2015-07-08 (×2): qty 3

## 2015-07-08 MED ORDER — ALBUTEROL SULFATE (2.5 MG/3ML) 0.083% IN NEBU
2.5000 mg | INHALATION_SOLUTION | Freq: Three times a day (TID) | RESPIRATORY_TRACT | Status: DC
  Administered 2015-07-08 – 2015-07-09 (×3): 2.5 mg via RESPIRATORY_TRACT
  Filled 2015-07-08 (×2): qty 3

## 2015-07-08 MED ORDER — GUAIFENESIN ER 600 MG PO TB12
1200.0000 mg | ORAL_TABLET | Freq: Two times a day (BID) | ORAL | Status: DC
  Administered 2015-07-08 – 2015-07-09 (×3): 1200 mg via ORAL
  Filled 2015-07-08 (×3): qty 2

## 2015-07-08 MED ORDER — ALBUTEROL SULFATE HFA 108 (90 BASE) MCG/ACT IN AERS: 2.0000 | INHALATION_SPRAY | RESPIRATORY_TRACT | Status: DC | PRN

## 2015-07-08 MED ORDER — INSULIN ASPART 100 UNIT/ML ~~LOC~~ SOLN
0.0000 [IU] | Freq: Three times a day (TID) | SUBCUTANEOUS | Status: DC
Start: 2015-07-08 — End: 2015-07-09
  Administered 2015-07-08: 3 [IU] via SUBCUTANEOUS
  Administered 2015-07-08: 2 [IU] via SUBCUTANEOUS
  Administered 2015-07-09: 3 [IU] via SUBCUTANEOUS
  Administered 2015-07-09: 5 [IU] via SUBCUTANEOUS

## 2015-07-08 MED ORDER — TIOTROPIUM BROMIDE MONOHYDRATE 18 MCG IN CAPS
18.0000 ug | ORAL_CAPSULE | Freq: Every day | RESPIRATORY_TRACT | Status: DC
  Administered 2015-07-09: 18 ug via RESPIRATORY_TRACT
  Filled 2015-07-08: qty 5

## 2015-07-08 MED ORDER — ALBUTEROL SULFATE (2.5 MG/3ML) 0.083% IN NEBU
2.5000 mg | INHALATION_SOLUTION | RESPIRATORY_TRACT | Status: DC
  Administered 2015-07-08: 2.5 mg via RESPIRATORY_TRACT
  Filled 2015-07-08: qty 3

## 2015-07-08 MED ORDER — ONDANSETRON HCL 4 MG/2ML IJ SOLN: 4.0000 mg | Freq: Four times a day (QID) | INTRAMUSCULAR | Status: DC | PRN

## 2015-07-08 MED ORDER — CETYLPYRIDINIUM CHLORIDE 0.05 % MT LIQD
7.0000 mL | Freq: Two times a day (BID) | OROMUCOSAL | Status: DC
  Administered 2015-07-08 – 2015-07-09 (×2): 7 mL via OROMUCOSAL

## 2015-07-08 MED ORDER — NITROGLYCERIN 0.4 MG SL SUBL: 0.4000 mg | SUBLINGUAL_TABLET | SUBLINGUAL | Status: DC | PRN

## 2015-07-08 MED ORDER — MORPHINE SULFATE (PF) 2 MG/ML IV SOLN: 2.0000 mg | INTRAVENOUS | Status: DC | PRN

## 2015-07-08 MED ORDER — SODIUM CHLORIDE 0.9 % IV SOLN: INTRAVENOUS | Status: DC

## 2015-07-08 MED ORDER — INSULIN ASPART 100 UNIT/ML ~~LOC~~ SOLN
3.0000 [IU] | Freq: Once | SUBCUTANEOUS | Status: AC
  Administered 2015-07-08: 3 [IU] via SUBCUTANEOUS

## 2015-07-08 MED ORDER — TRAMADOL HCL 50 MG PO TABS: 50.0000 mg | ORAL_TABLET | Freq: Two times a day (BID) | ORAL | Status: DC | PRN

## 2015-07-08 MED ORDER — IRBESARTAN 300 MG PO TABS
300.0000 mg | ORAL_TABLET | Freq: Every day | ORAL | Status: DC
  Administered 2015-07-08 – 2015-07-09 (×2): 300 mg via ORAL
  Filled 2015-07-08 (×2): qty 1

## 2015-07-08 MED ORDER — NAPROXEN SODIUM 220 MG PO TABS: 220.0000 mg | ORAL_TABLET | Freq: Two times a day (BID) | ORAL | Status: DC | PRN

## 2015-07-08 MED ORDER — ASPIRIN 81 MG PO CHEW
324.0000 mg | CHEWABLE_TABLET | Freq: Once | ORAL | Status: AC
  Administered 2015-07-08: 324 mg via ORAL
  Filled 2015-07-08: qty 4

## 2015-07-08 MED ORDER — ALBUTEROL SULFATE (2.5 MG/3ML) 0.083% IN NEBU
2.5000 mg | INHALATION_SOLUTION | RESPIRATORY_TRACT | Status: DC | PRN
  Administered 2015-07-09: 2.5 mg via RESPIRATORY_TRACT
  Filled 2015-07-08 (×2): qty 3

## 2015-07-08 NOTE — ED Provider Notes (Signed)
CSN: [id number]     Arrival date & time 07/08/15  0243 History   By signing my name below, I, Reginald Ayers, attest that this documentation has been prepared under the direction and in the presence of Loren Racer, MD.  Electronically Signed: Arlan Ayers, ED Scribe. 07/08/2015. 3:06 AM.   Chief Complaint  Patient presents with  . Shortness of Breath   The history is provided by the patient. No language interpreter was used.    HPI Comments: Reginald Ayers brought in by EMS is a 64 y.o. male with a PMHx of CAD, peripheral arterial disease, diabetes, HTN, and hyperlipidemia who presents to the Emergency Department complaining of constant, ongoing shortness of breath onset yesterday evening. Pt was just discharged this evening for same. However, pt states shortness of breath worsened along with pressure like chest pain after chasing after his dogs. 1 albuterol treatment given en route to department with improvement for chest discomfort. No other treatments or OTC medications attempted prior to EMS arrival. No recent fever, chills, nausea, or vomiting. [name] recently ran out of his blood pressure medication 2 weeks ago and plans to schedule an appointment with his PCP for a refill.  PCP: Kristie Cowman, MD   CARDIOLOGIST: Nanetta Batty, MD  Past Medical History  Diagnosis Date  . Coronary artery disease     sstatus post coronary artery bypass grafting February 2010 by Dr. Andrey Spearman  . Peripheral arterial disease (HCC)     post left common iliac and right SFA stenting remotely  . Carotid artery disease (HCC)   . Diabetes (HCC)   . Hypertension   . Hyperlipidemia   . Tobacco abuse     recently discontinued   Past Surgical History  Procedure Laterality Date  . Cardiac catheterization  2010  . Carotid angiogram  2014  . Doppler echocardiography  2010  . Nm myoview ltd  2010  . Lower extremity angiogram  11/2006    left common femoral endarterectomy and patch  angioplasty:The mid right SFA was angioplastied with a 4 x 8  . Lower extremity angiogram  2003    left common iliac artery stenting by Dr. Erlene Quan back in 2003    Family History  Problem Relation Age of Onset  . Heart attack Mother   . CVA Mother   . Diabetes Sister   . Hypertension Sister    Social History  Substance Use Topics  . Smoking status: Former Smoker    Quit date: 01/22/2014  . Smokeless tobacco: Current User    Types: Chew  . Alcohol Use: None    Review of Systems  Constitutional: Negative for fever and chills.  Respiratory: Positive for shortness of breath. Negative for cough.   Cardiovascular: Positive for chest pain and leg swelling. Negative for palpitations.  Gastrointestinal: Negative for nausea, vomiting and abdominal pain.  Musculoskeletal: Negative for back pain.  Skin: Negative for rash.  Neurological: Negative for dizziness, syncope, weakness, numbness and headaches.  Psychiatric/Behavioral: Negative for confusion.  All other systems reviewed and are negative.     Allergies  Review of patient's allergies indicates no known allergies.  Home Medications   Prior to Admission medications   Medication Sig Start Date End Date Taking? Authorizing Provider  clopidogrel (PLAVIX) 75 MG tablet Take 75 mg by mouth daily.  07/24/13   Historical Provider, MD  Cyanocobalamin (VITAMIN B-12 IJ) Inject 1 application as directed every 30 (thirty) days.     Historical Provider, MD  glipiZIDE (GLUCOTROL XL) 10 MG 24 hr tablet Take 10 mg by mouth 2 (two) times daily.  07/24/13   Historical Provider, MD  ibuprofen (ADVIL,MOTRIN) 200 MG tablet Take 600-800 mg by mouth every 6 (six) hours as needed for moderate pain.     Historical Provider, MD  Influenza vac split quadrivalent PF (FLUARIX) 0.5 ML injection Inject 0.5 mLs into the muscle once.    Historical Provider, MD  irbesartan (AVAPRO) 300 MG tablet Take 300 mg by mouth daily.  07/24/13   Historical Provider, MD   metFORMIN (GLUCOPHAGE) 500 MG tablet Take 1,000 mg by mouth 2 (two) times daily with a meal.  07/24/13   Historical Provider, MD  naproxen sodium (ANAPROX) 220 MG tablet Take 220 mg by mouth 2 (two) times daily as needed (for pain, takes).    Historical Provider, MD  NITROSTAT 0.4 MG SL tablet Take 1 tablet by mouth as needed for chest pain.  12/18/14   Historical Provider, MD  pioglitazone (ACTOS) 30 MG tablet Take 30 mg by mouth daily.  07/24/13   Historical Provider, MD  predniSONE (DELTASONE) 10 MG tablet Take 6 tablets (60 mg total) by mouth daily. 07/07/15   Derwood KaplanAnkit Nanavati, MD  PROAIR HFA 108 (90 BASE) MCG/ACT inhaler Inhale 2 puffs into the lungs every 4 (four) hours as needed.  07/24/13   Historical Provider, MD  simvastatin (ZOCOR) 20 MG tablet Take 1 tablet (20 mg total) by mouth at bedtime. Patient not taking: Reported on 07/07/2015 01/01/15   Leone BrandLaura R Ingold, NP  SPIRIVA HANDIHALER 18 MCG inhalation capsule Place 18 mcg into inhaler and inhale daily.  08/16/13   Historical Provider, MD  traMADol (ULTRAM) 50 MG tablet Take 50 mg by mouth every 12 (twelve) hours as needed for severe pain.  08/16/13   Historical Provider, MD   Triage Vitals: BP 141/61 mmHg  Pulse 105  Temp(Src) 98.4 F (36.9 C) (Oral)  Resp 17  SpO2 94%   Physical Exam  Constitutional: He is oriented to person, place, and time. He appears well-developed and well-nourished. No distress.  HENT:  Head: Normocephalic and atraumatic.  Mouth/Throat: Oropharynx is clear and moist.  Eyes: EOM are normal. Pupils are equal, round, and reactive to light.  Neck: Normal range of motion. Neck supple.  Cardiovascular: Regular rhythm.  Exam reveals no gallop and no friction rub.   No murmur heard. Tachycardia  Pulmonary/Chest: Effort normal and breath sounds normal. No respiratory distress. He has no wheezes. He has no rales. He exhibits no tenderness.  Abdominal: Soft. Bowel sounds are normal. He exhibits no distension and no  mass. There is no tenderness. There is no rebound and no guarding.  Musculoskeletal: Normal range of motion. He exhibits edema. He exhibits no tenderness.  1+ bilateral pitting edema to the ankles. No calf swelling or tenderness.  Neurological: He is alert and oriented to person, place, and time.  Moves all extremities without deficit. Sensation is fully intact.  Skin: Skin is warm and dry. No rash noted. No erythema.  Psychiatric: He has a normal mood and affect. His behavior is normal.  Nursing note and vitals reviewed.   ED Course  Procedures (including critical care time)  DIAGNOSTIC STUDIES: Oxygen Saturation is 95% on RA, adequate by my interpretation.    COORDINATION OF CARE: 2:56 AM-Discussed treatment plan with pt at bedside and pt agreed to plan.  a   Labs Review Labs Reviewed  CBC WITH DIFFERENTIAL/PLATELET - Abnormal; Notable for the following:  RBC 3.68 (*)    Hemoglobin 11.3 (*)    HCT 33.9 (*)    All other components within normal limits  BRAIN NATRIURETIC PEPTIDE - Abnormal; Notable for the following:    B Natriuretic Peptide 260.4 (*)    All other components within normal limits  COMPREHENSIVE METABOLIC PANEL - Abnormal; Notable for the following:    Glucose, Bld 262 (*)    ALT 16 (*)    Total Bilirubin 0.2 (*)    All other components within normal limits  I-STAT TROPOININ, ED    Imaging Review Dg Chest 2 View  07/07/2015  CLINICAL DATA:  Severe shortness of breath this morning.  Smoker. EXAM: CHEST  2 VIEW COMPARISON:  PA and lateral chest 12/06/2014 and 08/08/2012. FINDINGS: The patient is status post CABG. The chest is hyperexpanded. There is some coarsening of the pulmonary interstitium. No focal airspace disease identified. No pneumothorax or pleural effusion. IMPRESSION: COPD without acute disease. Electronically Signed   By: Drusilla Kanner M.D.   On: 07/07/2015 16:23   Dg Chest Port 1 View  07/08/2015  CLINICAL DATA:  Shortness of breath,  central chest pressure. EXAM: PORTABLE CHEST 1 VIEW COMPARISON:  Frontal lateral views 1 day prior. FINDINGS: Lungs remain hyperinflated. Patient is post median sternotomy. The heart size is normal. Pulmonary vasculature is normal. There is no consolidation, pulmonary edema, pleural effusion or pneumothorax. IMPRESSION: Stable hyperinflation.  No superimposed acute process. Electronically Signed   By: Rubye Oaks M.D.   On: 07/08/2015 03:28   I have personally reviewed and evaluated these images and lab results as part of my medical decision-making.   EKG Interpretation   Date/Time:  Monday July 08 2015 02:53:19 EST Ventricular Rate:  114 PR Interval:  161 QRS Duration: 117 QT Interval:  342 QTC Calculation: 471 R Axis:   95 Text Interpretation:  Sinus tachycardia Incomplete right bundle branch  block Anteroseptal infarct, age indeterminate Baseline wander in lead(s)  V5 Confirmed by Ranae Palms  MD, Bradlee Bridgers ([id number]) on 07/08/2015 3:54:52 AM      MDM   Final diagnoses:  Chest pain, unspecified chest pain type  Shortness of breath    I personally performed the services described in this documentation, which was scribed in my presence. The recorded information has been reviewed and is accurate.   Patient remained symptom-free in the emergency department. Given aspirin. Initially tachycardic and heart rate has improved. Think this is likely due to nebulized treatment. Discussed with Dr. Katrinka Blazing, tried hospitalist. Will admit to observation telemetry bed.  Loren Racer, MD 07/08/15 [phone]

## 2015-07-08 NOTE — H&P (Addendum)
[facility] History and Physical  Reginald Ayers ZOX:[id number] DOB: [birth year] DOA: 07/08/2015  Referring physician: ED PCP: Kristie Cowman, MD   Chief Complaint: "Chest tightness"  HPI:  Reginald Ayers is a 64 year old male with a past medical history significant for CABG, CAD, PAD, diabetes, hypertension, continued tobacco abuse; who presents with complaints of chest tightness and shortness of breath starting yesterday evening around 11 or 12 PM as he was sitting taking a look at his emails. He reports drinking a diet coke but denies that the pain felt like reflux. He describes symptoms as more pressure located over his previous CABG sternotomy scar. He tried one tablet and nitroglycerin without relief. Thereafter he called EMS and once they arrived they gave him aspirin 325 mg and nitroglycerin which patient states he felt immediate relief of chest pressure symptoms. Also notes during this time that he was having associated symptoms of shortness of breath. Denies any sweats, fever, chills. Denies any recurrence of those symptoms since being in the emergency department. Patient reports he was just discharged from the ED yesterday evening for similar complaints. He denies doing any strenuous activity at the onset of symptoms. 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 Also notes that he continues to smoke intermittently. Patient also notes that after yesterday evening he was not able to go get the prescriptions filled for prednisone and he is still wheezing.   Review of Systems  Constitutional: Negative for fever, malaise/fatigue and diaphoresis.  HENT: Negative for ear pain and hearing loss.   Eyes: Negative for photophobia and pain.  Respiratory: Positive for cough, shortness of breath and wheezing.   Cardiovascular: Positive for chest pain, palpitations and leg swelling.  Gastrointestinal: Negative for diarrhea and constipation.  Genitourinary: Negative for urgency and frequency.   Musculoskeletal: Positive for joint pain. Negative for falls.  Skin: Negative for rash.  Neurological: Negative for sensory change and speech change.  Endo/Heme/Allergies: Negative for polydipsia. Does not bruise/bleed easily.  Psychiatric/Behavioral: Negative for hallucinations and substance abuse.       Past Medical History  Diagnosis Date  . Coronary artery disease     sstatus post coronary artery bypass grafting February 2010 by Dr. Andrey Spearman  . Peripheral arterial disease (HCC)     post left common iliac and right SFA stenting remotely  . Carotid artery disease (HCC)   . Diabetes (HCC)   . Hypertension   . Hyperlipidemia   . Tobacco abuse     recently discontinued     Past Surgical History  Procedure Laterality Date  . Cardiac catheterization  2010  . Carotid angiogram  2014  . Doppler echocardiography  2010  . Nm myoview ltd  2010  . Lower extremity angiogram  11/2006    left common femoral endarterectomy and patch angioplasty:The mid right SFA was angioplastied with a 4 x 8  . Lower extremity angiogram  2003    left common iliac artery stenting by Dr. Erlene Quan back in 2003       Social History:  reports that he quit smoking about 17 months ago. His smokeless tobacco use includes Chew. His alcohol and drug histories are not on file. where does patient live--home Can patient participate in ADLs? yes  No Known Allergies  Family History  Problem Relation Age of Onset  . Heart attack Mother   . CVA Mother   . Diabetes Sister   . Hypertension Sister       Prior to Admission medications  Medication Sig Start Date End Date Taking? Authorizing Provider  clopidogrel (PLAVIX) 75 MG tablet Take 75 mg by mouth daily.  07/24/13  Yes Historical Provider, MD  Cyanocobalamin (VITAMIN B-12 IJ) Inject 1 application as directed every 30 (thirty) days.    Yes Historical Provider, MD  glipiZIDE (GLUCOTROL XL) 10 MG 24 hr tablet Take 10 mg by mouth 2 (two) times  daily.  07/24/13  Yes Historical Provider, MD  ibuprofen (ADVIL,MOTRIN) 200 MG tablet Take 600-800 mg by mouth every 6 (six) hours as needed for moderate pain.    Yes Historical Provider, MD  irbesartan (AVAPRO) 300 MG tablet Take 300 mg by mouth daily.  07/24/13  Yes Historical Provider, MD  metFORMIN (GLUCOPHAGE) 500 MG tablet Take 1,000 mg by mouth 2 (two) times daily with a meal.  07/24/13  Yes Historical Provider, MD  naproxen sodium (ANAPROX) 220 MG tablet Take 220 mg by mouth 2 (two) times daily as needed (for pain, takes).   Yes Historical Provider, MD  NITROSTAT 0.4 MG SL tablet Take 1 tablet by mouth every 5 (five) minutes as needed for chest pain.  12/18/14  Yes Historical Provider, MD  pioglitazone (ACTOS) 30 MG tablet Take 30 mg by mouth daily.  07/24/13  Yes Historical Provider, MD  PROAIR HFA 108 (90 BASE) MCG/ACT inhaler Inhale 2 puffs into the lungs every 4 (four) hours as needed for wheezing or shortness of breath.  07/24/13  Yes Historical Provider, MD  SPIRIVA HANDIHALER 18 MCG inhalation capsule Place 18 mcg into inhaler and inhale daily.  08/16/13  Yes Historical Provider, MD  traMADol (ULTRAM) 50 MG tablet Take 50 mg by mouth every 12 (twelve) hours as needed for severe pain.  08/16/13  Yes Historical Provider, MD  predniSONE (DELTASONE) 10 MG tablet Take 6 tablets (60 mg total) by mouth daily. Patient not taking: Reported on 07/08/2015 07/07/15   Derwood KaplanAnkit Nanavati, MD     Physical Exam: Filed Vitals:   07/08/15 0430 07/08/15 0445 07/08/15 0500 07/08/15 0515  BP: 134/75 138/70 133/78 152/69  Pulse: 108 104 100 100  Temp:      TempSrc:      Resp: 22 19 20 20   SpO2: 91% 96% 96% 96%     Constitutional: Vital signs reviewed. Patient is a well-developed and well-nourished in no acute distress and cooperative with exam. Alert and oriented x3.  Head: Normocephalic and atraumatic  Ear: TM normal bilaterally  Mouth: no erythema or exudates, MMM  Eyes: PERRL, EOMI, conjunctivae  normal, No scleral icterus.  Neck: Supple, Trachea midline normal ROM, No JVD, mass, thyromegaly, or carotid bruit present.  Cardiovascular: RRR, S1 normal, S2 normal, no MRG, pulses symmetric and intact bilaterally  Pulmonary/Chest: Hyperinflated chest with expiratory wheezes heard throughout Abdominal: Soft. Non-tender, non-distended, bowel sounds are normal, no masses, organomegaly, or guarding present.  GU: no CVA tenderness Musculoskeletal: No joint deformities, erythema, or stiffness, ROM full and no nontender Ext: +1 pitting edema edema and no cyanosis, pulses palpable bilaterally (DP and PT)  Hematology: no cervical, inginal, or axillary adenopathy.  Neurological: A&O x3, Strenght is normal and symmetric bilaterally, cranial nerve II-XII are grossly intact, no focal motor deficit, sensory intact to light touch bilaterally.  Skin: Warm, dry and intact. No rash, cyanosis, or clubbing.  Psychiatric: Normal mood and affect. speech and behavior is normal. Judgment and thought content normal. Cognition and memory are normal.      Data Review   Micro Results No results found for this  or any previous visit (from the past 240 hour(s)).  Radiology Reports Dg Chest 2 View  07/07/2015  CLINICAL DATA:  Severe shortness of breath this morning.  Smoker. EXAM: CHEST  2 VIEW COMPARISON:  PA and lateral chest 12/06/2014 and 08/08/2012. FINDINGS: The patient is status post CABG. The chest is hyperexpanded. There is some coarsening of the pulmonary interstitium. No focal airspace disease identified. No pneumothorax or pleural effusion. IMPRESSION: COPD without acute disease. Electronically Signed   By: Drusilla Kanner M.D.   On: 07/07/2015 16:23   Dg Chest Port 1 View  07/08/2015  CLINICAL DATA:  Shortness of breath, central chest pressure. EXAM: PORTABLE CHEST 1 VIEW COMPARISON:  Frontal lateral views 1 day prior. FINDINGS: Lungs remain hyperinflated. Patient is post median sternotomy. The heart  size is normal. Pulmonary vasculature is normal. There is no consolidation, pulmonary edema, pleural effusion or pneumothorax. IMPRESSION: Stable hyperinflation.  No superimposed acute process. Electronically Signed   By: Rubye Oaks M.D.   On: 07/08/2015 03:28     CBC  Recent Labs Lab 07/07/15 1604 07/08/15 0325  WBC 8.0 7.5  HGB 11.5* 11.3*  HCT 34.9* 33.9*  PLT 235 216  MCV 92.8 92.1  MCH 30.6 30.7  MCHC 33.0 33.3  RDW 14.0 14.0  LYMPHSABS 1.6 1.2  MONOABS 0.9 0.6  EOSABS 0.1 0.1  BASOSABS 0.0 0.0    Chemistries   Recent Labs Lab 07/07/15 1604 07/08/15 0325  NA 134* 135  K 4.0 4.2  CL 99* 102  CO2 24 25  GLUCOSE 223* 262*  BUN 13 11  CREATININE 0.78 0.71  CALCIUM 9.3 9.4  AST  --  19  ALT  --  16*  ALKPHOS  --  58  BILITOT  --  0.2*   ------------------------------------------------------------------------------------------------------------------ CrCl cannot be calculated (Unknown ideal weight.). ------------------------------------------------------------------------------------------------------------------ No results for input(s): HGBA1C in the last 72 hours. ------------------------------------------------------------------------------------------------------------------ No results for input(s): CHOL, HDL, LDLCALC, TRIG, CHOLHDL, LDLDIRECT in the last 72 hours. ------------------------------------------------------------------------------------------------------------------ No results for input(s): TSH, T4TOTAL, T3FREE, THYROIDAB in the last 72 hours.  Invalid input(s): FREET3 ------------------------------------------------------------------------------------------------------------------ No results for input(s): VITAMINB12, FOLATE, FERRITIN, TIBC, IRON, RETICCTPCT in the last 72 hours.  Coagulation profile No results for input(s): INR, PROTIME in the last 168 hours.  No results for input(s): DDIMER in the last 72 hours.  Cardiac  Enzymes  Recent Labs Lab 07/07/15 1604 07/07/15 2043  TROPONINI <0.03 <0.03   ------------------------------------------------------------------------------------------------------------------ Invalid input(s): POCBNP   CBG: No results for input(s): GLUCAP in the last 168 hours.     EKG: Independently reviewed. Sinus tachycardia with incomplete right bundle branch block  Assessment/Plan Principal Problem:    Chest pain. Patient again returns with chest pressure symptoms. Heart score is approximately 6. Will admit for observation over night. Patient with no report of return of chest pain at this time. Cardiac enzymes negative 1. EKG similar to previous per review except for tachycardia. Patient's bnp increased to 260 from previous 11/13 .Question if patient symptoms are cardiac or related to patient's respiratory status. -Cardiac enzymes 3 -Patient may benefit from diuresis -Patient made nothing by mouth after midnight -Would consult cards in a.m. for further evaluation of patient's symptoms   COPD with signs of acute bronchitis: No signs of acute infection on chest x-ray on reveals hyperinflated lungs. Patient found on physical exam to be significantly wheezing. Patient never able to pick up prednisone which was prescribed. -Prednisone 60 mg -Albuterol nebs -Spiriva     Carotid artery  disease with history of CABG -Continue Plavix, aspirin    Essential hypertension: Relatively well controlled -Continue irbesartan  Hyponatremia:Resolved. Sodium 135 today, previously was 134   Tobacco abuse: Patient appears to still intermittently smoke tobacco -Counseled patient on the cessation of tobacco  Peripheral arterial disease (HCC):stable  Hyperlipidemia: Stable Code Status:   full Family Communication: bedside Disposition Plan: admit   Total time spent 55 minutes.Greater than 50% of this time was spent in counseling, explanation of diagnosis, planning of further  management, and coordination of care  Clydie Braun [facility] Pager [phone]  If 7PM-7AM, please contact night-coverage [email or web address] Password [email or web address] 07/08/2015, 6:08 AM

## 2015-07-08 NOTE — Progress Notes (Signed)
Patient Demographics  Reginald Ayers, is a 63 y.o. male, DOB - 23-Sep-1950, BJY:[id number]  Admit date - 07/08/2015   Admitting Physician Clydie Braun, MD  Outpatient Primary MD for the patient is Kristie Cowman, MD  LOS -    Chief Complaint  Patient presents with  . Shortness of Breath         Subjective:   Reginald Ayers today has, No headache, , No abdominal pain - No Nausea, No new weakness tingling or numbness, and planes of cough and shortness of breath, reports chest soreness on coughing.  Assessment & Plan    Principal Problem:   Chest pain Active Problems:   Peripheral arterial disease (HCC)   Carotid artery disease (HCC)   Essential hypertension   Hyperlipidemia   Tobacco abuse  Chest pain - Nontypical, described it more as soreness related to cough, but given his significant cardiac history will monitor him on telemetry, 2-D echo, and cycle cardiac enzymes.  COPD exacerbation - Continue with prednisone, will start on levofloxacin, continue with nebs, spiriva, will start on mucinex and flutter valve.  History of coronary artery disease - Chest pain nontypical, continue with aspirin, Plavix, statin, irbesartan, does not appear to be in any beta blockers, won't start at this point given his history of COPD.  Hypertension - Continue with irbesartan  Tobacco abuse - Consult  Peripheral arterial disease - Stable, old by Dr. Allyson Sabal, on aspirin and Plavix  Code Status: Full  Family Communication: None at bedside  Disposition Plan: Home in 24 hour if remains stable   Procedures  None   Consults   None   Medications  Scheduled Meds: . albuterol  2.5 mg Nebulization TID  . clopidogrel  75 mg Oral Daily  . enoxaparin (LOVENOX) injection  40 mg Subcutaneous Q24H  . guaiFENesin  1,200 mg Oral BID  . insulin aspart  0-15 Units Subcutaneous TID WC  . irbesartan   300 mg Oral Daily  . predniSONE  60 mg Oral Daily  . tiotropium  18 mcg Inhalation Daily   Continuous Infusions:  PRN Meds:.acetaminophen, albuterol, morphine injection, naproxen, nitroGLYCERIN, ondansetron (ZOFRAN) IV, traMADol  DVT Prophylaxis  Lovenox -   Lab Results  Component Value Date   PLT 216 07/08/2015    Antibiotics    Anti-infectives    None          Objective:   Filed Vitals:   07/08/15 0715 07/08/15 0730 07/08/15 0734 07/08/15 0810  BP: 145/76 138/77  151/70  Pulse: 95 98  93  Temp:    98.4 F (36.9 C)  TempSrc:    Oral  Resp: Height:     (1.549 m)  Weight:    59.6 kg (131 lb 6.3 oz)  SpO2: 96% 95% 99% 98%    Wt Readings from Last 3 Encounters:  07/08/15 59.6 kg (131 lb 6.3 oz)  01/30/15 61.236 kg (135 lb)  01/01/15 62.143 kg (137 lb)     Intake/Output Summary (Last 24 hours) at 07/08/15 0957 Last data filed at 07/08/15 0811  Gross per 24 hour  Intake      0 ml  Output    400 ml  Net   -400  ml     Physical Exam  Awake Alert, Oriented X 3, No new F.N deficits, Normal affect [place].AT,PERRAL Supple Neck,No JVD, No cervical lymphadenopathy appriciated.  Symmetrical Chest wall movement, Good air movement bilaterally,  RRR,No Gallops,Rubs or new Murmurs, No Parasternal Heave +ve B.Sounds, Abd Soft, No tenderness, No organomegaly appriciated, No rebound - guarding or rigidity. No Cyanosis, Clubbing or edema, No new Rash or bruise     Data Review   Micro Results No results found for this or any previous visit (from the past 240 hour(s)).  Radiology Reports Dg Chest 2 View  07/07/2015  CLINICAL DATA:  Severe shortness of breath this morning.  Smoker. EXAM: CHEST  2 VIEW COMPARISON:  PA and lateral chest 12/06/2014 and 08/08/2012. FINDINGS: The patient is status post CABG. The chest is hyperexpanded. There is some coarsening of the pulmonary interstitium. No focal airspace disease identified. No pneumothorax or pleural  effusion. IMPRESSION: COPD without acute disease. Electronically Signed   By: Drusilla Kannerhomas  Dalessio M.D.   On: 07/07/2015 16:23   Dg Chest Port 1 View  07/08/2015  CLINICAL DATA:  Shortness of breath, central chest pressure. EXAM: PORTABLE CHEST 1 VIEW COMPARISON:  Frontal lateral views 1 day prior. FINDINGS: Lungs remain hyperinflated. Patient is post median sternotomy. The heart size is normal. Pulmonary vasculature is normal. There is no consolidation, pulmonary edema, pleural effusion or pneumothorax. IMPRESSION: Stable hyperinflation.  No superimposed acute process. Electronically Signed   By: Rubye OaksMelanie  Ehinger M.D.   On: 07/08/2015 03:28     CBC  Recent Labs Lab 07/07/15 1604 07/08/15 0325  WBC 8.0 7.5  HGB 11.5* 11.3*  HCT 34.9* 33.9*  PLT 235 216  MCV 92.8 92.1  MCH 30.6 30.7  MCHC 33.0 33.3  RDW 14.0 14.0  LYMPHSABS 1.6 1.2  MONOABS 0.9 0.6  EOSABS 0.1 0.1  BASOSABS 0.0 0.0    Chemistries   Recent Labs Lab 07/07/15 1604 07/08/15 0325  NA 134* 135  K 4.0 4.2  CL 99* 102  CO2 24 25  GLUCOSE 223* 262*  BUN 13 11  CREATININE 0.78 0.71  CALCIUM 9.3 9.4  AST  --  19  ALT  --  16*  ALKPHOS  --  58  BILITOT  --  0.2*   ------------------------------------------------------------------------------------------------------------------ estimated creatinine clearance is 67.2 mL/min (by C-G formula based on Cr of 0.71). ------------------------------------------------------------------------------------------------------------------ No results for input(s): HGBA1C in the last 72 hours. ------------------------------------------------------------------------------------------------------------------ No results for input(s): CHOL, HDL, LDLCALC, TRIG, CHOLHDL, LDLDIRECT in the last 72 hours. ------------------------------------------------------------------------------------------------------------------ No results for input(s): TSH, T4TOTAL, T3FREE, THYROIDAB in the last 72  hours.  Invalid input(s): FREET3 ------------------------------------------------------------------------------------------------------------------ No results for input(s): VITAMINB12, FOLATE, FERRITIN, TIBC, IRON, RETICCTPCT in the last 72 hours.  Coagulation profile No results for input(s): INR, PROTIME in the last 168 hours.  No results for input(s): DDIMER in the last 72 hours.  Cardiac Enzymes  Recent Labs Lab 07/07/15 1604 07/07/15 2043  TROPONINI <0.03 <0.03   ------------------------------------------------------------------------------------------------------------------ Invalid input(s): POCBNP     Time Spent in minutes  No charge   Randol KernELGERGAWY, Jerrie Gullo M.D on 07/08/2015 at 9:57 AM  Between 7am to 7pm - Pager - [phone]  After 7pm go to [email or web address] - password [email or web address]  [facility]   Office  [phone]

## 2015-07-08 NOTE — ED Notes (Signed)
Pt was seen here yesterday night was dc home and got home around midnight, pt started feeling SOB again at home and call EMS pt was on the mid 80% on RA on [facility] arrival one albuterol treatment given by [facility] and O2 saturation back to 96%, HR 110, BP 151/73. Pt denies any pain at this time.

## 2015-07-08 NOTE — Clinical Social Work Note (Signed)
CSW received referral for difficulty obtaining medications case manager can assist with this, CSW to sign off please reconsult if other social work needs arise.  [name], MSW, Theresia MajorsLCSWA [phone] 07/08/2015 8:41 AM

## 2015-07-08 NOTE — Progress Notes (Signed)
ANTIBIOTIC CONSULT NOTE - INITIAL  Pharmacy Consult for Levaquin Indication: COPD exacerbation  No Known Allergies  Patient Measurements: Height: 5\' 1"  (154.9 cm) Weight: 131 lb 6.3 oz (59.6 kg) IBW/kg (Calculated) : 52.3  Vital Signs: Temp: 98.4 F (36.9 C) (11/14 0810) Temp Source: Oral (11/14 0810) BP: 151/70 mmHg (11/14 0810) Pulse Rate: 93 (11/14 0810) Intake/Output from previous day:   Intake/Output from this shift: Total I/O In: -  Out: 400 [Urine:400]  Labs:  Recent Labs  07/07/15 1604 07/08/15 0325  WBC 8.0 7.5  HGB 11.5* 11.3*  PLT 235 216  CREATININE 0.78 0.71   Estimated Creatinine Clearance: 67.2 mL/min (by C-G formula based on Cr of 0.71). No results for input(s): VANCOTROUGH, VANCOPEAK, VANCORANDOM, GENTTROUGH, GENTPEAK, GENTRANDOM, TOBRATROUGH, TOBRAPEAK, TOBRARND, AMIKACINPEAK, AMIKACINTROU, AMIKACIN in the last 72 hours.   Microbiology: No results found for this or any previous visit (from the past 720 hour(s)).  Medical History: Past Medical History  Diagnosis Date  . Coronary artery disease     sstatus post coronary artery bypass grafting February 2010 by Dr. Andrey SpearmanSteve Hendrickson  . Peripheral arterial disease (HCC)     post left common iliac and right SFA stenting remotely  . Carotid artery disease (HCC)   . Diabetes (HCC)   . Hypertension   . Hyperlipidemia   . Tobacco abuse     recently discontinued    Medications:  Scheduled:  . albuterol  2.5 mg Nebulization TID  . clopidogrel  75 mg Oral Daily  . enoxaparin (LOVENOX) injection  40 mg Subcutaneous Q24H  . guaiFENesin  1,200 mg Oral BID  . insulin aspart  0-15 Units Subcutaneous TID WC  . irbesartan  300 mg Oral Daily  . predniSONE  60 mg Oral Daily  . tiotropium  18 mcg Inhalation Daily   Assessment: 64 yo M presented to ED 11/14 with SOB and chest tightness.  Pt was seen and discharged from the ED 11/13 with similar symptoms.  Pt was unable to fill his prescription for  prednisone from that visit and was still wheezing.  PMH: CAD s/p CABG, DM, HTN, PAD, ongoing tobacco use  Goal of Therapy:  Eradication of Infection  Plan:  Levaquin 750 mg PO daily  Suggest length of therapy of 5-7 days. Rx will sign off.  Rx will be alerted to any further need for dose adjustments by [employer] monitoring software.  Toys 'R' UsKimberly Naliah Eddington, Pharm.D., BCPS Clinical Pharmacist Pager [phone] 07/08/2015 11:27 AM

## 2015-07-08 NOTE — ED Notes (Signed)
Pt remains monitored by blood pressure, pulse ox, and 12 lead.  

## 2015-07-09 ENCOUNTER — Observation Stay (HOSPITAL_BASED_OUTPATIENT_CLINIC_OR_DEPARTMENT_OTHER): Payer: Medicare Other

## 2015-07-09 ENCOUNTER — Encounter (HOSPITAL_COMMUNITY): Payer: Self-pay | Admitting: Physician Assistant

## 2015-07-09 DIAGNOSIS — R0600 Dyspnea, unspecified: Secondary | ICD-10-CM | POA: Diagnosis not present

## 2015-07-09 DIAGNOSIS — I739 Peripheral vascular disease, unspecified: Secondary | ICD-10-CM | POA: Diagnosis not present

## 2015-07-09 DIAGNOSIS — I10 Essential (primary) hypertension: Secondary | ICD-10-CM | POA: Diagnosis not present

## 2015-07-09 DIAGNOSIS — E785 Hyperlipidemia, unspecified: Secondary | ICD-10-CM | POA: Diagnosis not present

## 2015-07-09 DIAGNOSIS — R079 Chest pain, unspecified: Secondary | ICD-10-CM | POA: Diagnosis not present

## 2015-07-09 LAB — LIPID PANEL
CHOL/HDL RATIO: 3.1 ratio
Cholesterol: 173 mg/dL (ref 0–200)
HDL: 55 mg/dL (ref >40)
LDL CALC: 109 mg/dL — AB (ref 0–99)
Triglycerides: 47 mg/dL (ref <150)
VLDL: 9 mg/dL (ref 0–40)

## 2015-07-09 LAB — GLUCOSE, CAPILLARY
GLUCOSE-CAPILLARY: 231 mg/dL — AB (ref 65–99)
Glucose-Capillary: 167 mg/dL — ABNORMAL HIGH (ref 65–99)

## 2015-07-09 MED ORDER — AMLODIPINE BESYLATE 5 MG PO TABS
2.5000 mg | ORAL_TABLET | Freq: Every day | ORAL | Status: DC
  Administered 2015-07-09: 2.5 mg via ORAL
  Filled 2015-07-09: qty 1

## 2015-07-09 MED ORDER — LEVOFLOXACIN 500 MG PO TABS: 500.0000 mg | ORAL_TABLET | Freq: Every day | ORAL | Status: DC

## 2015-07-09 MED ORDER — AMLODIPINE BESYLATE 2.5 MG PO TABS: 2.5000 mg | ORAL_TABLET | Freq: Every day | ORAL | Status: DC

## 2015-07-09 MED ORDER — SIMVASTATIN 20 MG PO TABS: 20.0000 mg | ORAL_TABLET | Freq: Every day | ORAL | Status: DC

## 2015-07-09 MED ORDER — ALBUTEROL SULFATE (2.5 MG/3ML) 0.083% IN NEBU: 2.5000 mg | INHALATION_SOLUTION | Freq: Three times a day (TID) | RESPIRATORY_TRACT | Status: DC

## 2015-07-09 MED ORDER — PERFLUTREN LIPID MICROSPHERE
1.0000 mL | INTRAVENOUS | Status: AC | PRN
  Administered 2015-07-09: 2 mL via INTRAVENOUS
  Filled 2015-07-09: qty 10

## 2015-07-09 MED ORDER — PREDNISONE 10 MG PO TABS: ORAL_TABLET | ORAL | Status: DC

## 2015-07-09 MED ORDER — PREDNISONE 20 MG PO TABS: 40.0000 mg | ORAL_TABLET | Freq: Every day | ORAL | Status: DC

## 2015-07-09 NOTE — Discharge Summary (Signed)
Reginald Ayers, is a 64 y.o. male  DOB 07-20-1951  MRN [id number].  Admission date:  07/08/2015  Admitting Physician  Reginald Braunondell A Smith, MD  Discharge Date:  07/09/2015   Primary MD  Reginald CowmanSCHOOLER, KAREN, MD  Recommendations for primary care physician for things to follow:  - Check CBC, BMP during next visit. - Need to follow with cardiology as an outpatient   Admission Diagnosis  Shortness of breath [R06.02] Chest pain, unspecified chest pain type [R07.9]   Discharge Diagnosis  Shortness of breath [R06.02] Chest pain, unspecified chest pain type [R07.9]    Principal Problem:   Chest pain Active Problems:   Peripheral arterial disease (HCC)   Carotid artery disease (HCC)   Essential hypertension   Hyperlipidemia   Tobacco abuse      Past Medical History  Diagnosis Date  . Coronary artery disease     s/p CABG February 2010 by Reginald Ayers  . Peripheral arterial disease (HCC)     post left common iliac and right SFA stenting remotely  . Carotid artery disease (HCC)     L-ICA 100%, mod R-ICA dz  . Diabetes (HCC)   . Hypertension   . Hyperlipidemia   . Tobacco abuse          Past Surgical History  Procedure Laterality Date  . Cardiac catheterization  2010  . Carotid angiogram  2014  . Doppler echocardiography  2010  . Nm myoview ltd  2015  . Lower extremity angiogram  11/2006    left common femoral endarterectomy and patch angioplasty:The mid right SFA was angioplastied with a 4 x 8  . Lower extremity angiogram  2003    left common iliac artery stenting by Dr. Erlene QuanJ. Ayers  . Coronary artery bypass graft  2010    LIMA-LAD, Lrad-OM1, SVG-RI, SVG-AM-dRCA       History of present illness and  Hospital Course:     Kindly see H&P for history of present illness and admission details, please review complete Labs, Consult reports and Test reports for all details in brief  HPI  from  the history and physical done on the day of admission  Reginald Ayers is a 64 year old male with a past medical history significant for CABG, CAD, PAD, diabetes, hypertension, continued tobacco abuse; who presents with complaints of chest tightness and shortness of breath starting yesterday evening around 11 or 12 PM as he was sitting taking a look at his emails. He reports drinking a diet coke but denies that the pain felt like reflux. He describes symptoms as more pressure located over his previous CABG sternotomy scar. He tried one tablet and nitroglycerin without relief. Thereafter he called EMS and once they arrived they gave him aspirin 325 mg and nitroglycerin which patient states he felt immediate relief of chest pressure symptoms. Also notes during this time that he was having associated symptoms of shortness of breath. Denies any sweats, fever, chills. Denies any recurrence of those symptoms since being in the emergency department. Patient  reports he was just discharged from the ED yesterday evening for similar complaints. He denies doing any strenuous activity at the onset of symptoms. 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 Also notes that he continues to smoke intermittently. Patient also notes that after yesterday evening he was not able to go get the prescriptions filled for prednisone and he is still wheezing.  Hospital Course  Reginald Ayers is a 64 year old male with a past medical history significant for CABG, CAD, PAD, diabetes, hypertension, continued tobacco abuse; who presents with complaints of chest tightness and shortness of breath , chest tightness thought secondary to COPD exacerbation, patient had negative troponins 3, 2-D echo showing regional wall motion abnormality,Cardiology service consulted, referring these abnormal regional wall motion abnormalities on echo are old ,COPD appears to be improving on oral prednisone.  Chest pain - Has Nontypical features, described it more as  soreness related to cough. - Patient has negative troponins 3,  - 2-D echo 07/08/2014 showing abnormal regional wall motion abnormality including Akinesis of the apical myocardium, Dyskinesis of the apical anteroseptal myocardium and hypokinesis of the mid to apical anterior, cardiology consult appreciated,  abnormal echo finding most likely related to previous Myoview findings in January 2015 which is consistent with apical scar , no further workup indicated as an inpatient , to be seen by cardiology as an outpatient .  COPD exacerbation - Wheezing significantly improved will  started on prednisone taper and levofloxacinnue with nebs, spiriva, will start on mucinex and flutter valve.  History of coronary artery disease - continue with Plavix, statin, irbesartan, does not appear to be in any beta blockers, won't start at this point given his history of COPD. - Started on low-dose amlodipine amlodipine 2.5 mg oral daily. - Patient was instructed to resume taking his simvastatin , reports he still has it at home , he stopped taking it for unknown reason .  Hypertension - Continue with irbesartan, started on low-dose amlodipine for uncontrolled blood pressure .  Tobacco abuse - Counseled  Peripheral arterial disease - Stable, followed by Reginald Ayers.    Discharge Condition:  Stable   Follow UP  Follow-up Information    Follow up with Reginald Batty, MD.   Specialties:  Cardiology, Radiology   Why:  The office will call.   Contact information:   [address] [place] [address] [phone]       Follow up with Reginald Cowman, MD. Schedule an appointment as soon as possible for a visit in 1 week.   Specialty:  Internal Medicine   Why:  Posthospitalization follow-up   Contact information:   [address] [place] [address] [phone]         Discharge Instructions  and  Discharge Medications         Discharge Instructions     Discharge instructions    Complete by:  As directed   Follow with Primary MD Reginald Cowman, MD in 7 days   Get CBC, CMP, 2 view Chest X ray checked  by Primary MD next visit.    Activity: As tolerated with Full fall precautions use walker/cane & assistance as needed   Disposition Home    Diet: Heart Healthy , carbohydrate modified , with feeding assistance and aspiration precautions.  For Heart failure patients - Check your Weight same time everyday, if you gain over 2 pounds, or you develop in leg swelling, experience more shortness of breath or chest pain, call your  Primary MD immediately. Follow Cardiac Low Salt Diet and 1.5 lit/day fluid restriction.   On your next visit with your primary care physician please Get Medicines reviewed and adjusted.   Please request your Prim.MD to go over all Hospital Tests and Procedure/Radiological results at the follow up, please get all Hospital records sent to your Prim MD by signing hospital release before you go home.   If you experience worsening of your admission symptoms, develop shortness of breath, life threatening emergency, suicidal or homicidal thoughts you must seek medical attention immediately by calling 911 or calling your MD immediately  if symptoms less severe.  You Must read complete instructions/literature along with all the possible adverse reactions/side effects for all the Medicines you take and that have been prescribed to you. Take any new Medicines after you have completely understood and accpet all the possible adverse reactions/side effects.   Do not drive, operating heavy machinery, perform activities at heights, swimming or participation in water activities or provide baby sitting services if your were admitted for syncope or siezures until you have seen by Primary MD or a Neurologist and advised to do so again.  Do not drive when taking Pain medications.    Do not take more than prescribed Pain, Sleep and Anxiety  Medications  Special Instructions: If you have smoked or chewed Tobacco  in the last 2 yrs please stop smoking, stop any regular Alcohol  and or any Recreational drug use.  Wear Seat belts while driving.   Please note  You were cared for by a hospitalist during your hospital stay. If you have any questions about your discharge medications or the care you received while you were in the hospital after you are discharged, you can call the unit and asked to speak with the hospitalist on call if the hospitalist that took care of you is not available. Once you are discharged, your primary care physician will handle any further medical issues. Please note that NO REFILLS for any discharge medications will be authorized once you are discharged, as it is imperative that you return to your primary care physician (or establish a relationship with a primary care physician if you do not have one) for your aftercare needs so that they can reassess your need for medications and monitor your lab values.     Increase activity slowly    Complete by:  As directed             Medication List    STOP taking these medications        ibuprofen 200 MG tablet  Commonly known as:  ADVIL,MOTRIN     naproxen sodium 220 MG tablet  Commonly known as:  ANAPROX      TAKE these medications        amLODipine 2.5 MG tablet  Commonly known as:  NORVASC  Take 1 tablet (2.5 mg total) by mouth daily.     clopidogrel 75 MG tablet  Commonly known as:  PLAVIX  Take 75 mg by mouth daily.     glipiZIDE 10 MG 24 hr tablet  Commonly known as:  GLUCOTROL XL  Take 10 mg by mouth 2 (two) times daily.     irbesartan 300 MG tablet  Commonly known as:  AVAPRO  Take 300 mg by mouth daily.     levofloxacin 500 MG tablet  Commonly known as:  LEVAQUIN  Take 1 tablet (500 mg total) by mouth daily. Take 4 days then stop  metFORMIN 500 MG tablet  Commonly known as:  GLUCOPHAGE  Take 1,000 mg by mouth 2 (two) times daily  with a meal.     NITROSTAT 0.4 MG SL tablet  Generic drug:  nitroGLYCERIN  Take 1 tablet by mouth every 5 (five) minutes as needed for chest pain.     pioglitazone 30 MG tablet  Commonly known as:  ACTOS  Take 30 mg by mouth daily.     predniSONE 10 MG tablet  Commonly known as:  DELTASONE  Please take 4 tablets  X 3 day, then 3 tablets  X 3 days, then 2 tablets  X3 days, then 1 tablet  X 3 days then stop.     PROAIR HFA 108 (90 BASE) MCG/ACT inhaler  Generic drug:  albuterol  Inhale 2 puffs into the lungs every 4 (four) hours as needed for wheezing or shortness of breath.     albuterol (2.5 MG/3ML) 0.083% nebulizer solution  Commonly known as:  PROVENTIL  Take 3 mLs (2.5 mg total) by nebulization 3 (three) times daily.     simvastatin 20 MG tablet  Commonly known as:  ZOCOR  Take 1 tablet (20 mg total) by mouth daily.     SPIRIVA HANDIHALER 18 MCG inhalation capsule  Generic drug:  tiotropium  Place 18 mcg into inhaler and inhale daily.     traMADol 50 MG tablet  Commonly known as:  ULTRAM  Take 50 mg by mouth every 12 (twelve) hours as needed for severe pain.     VITAMIN B-12 IJ  Inject 1 application as directed every 30 (thirty) days.          Diet and Activity recommendation: See Discharge Instructions above   Consults obtained -   cardiology   Major procedures and Radiology Reports - PLEASE review detailed and final reports for all details, in brief -      Dg Chest 2 View  07/07/2015  CLINICAL DATA:  Severe shortness of breath this morning.  Smoker. EXAM: CHEST  2 VIEW COMPARISON:  PA and lateral chest 12/06/2014 and 08/08/2012. FINDINGS: The patient is status post CABG. The chest is hyperexpanded. There is some coarsening of the pulmonary interstitium. No focal airspace disease identified. No pneumothorax or pleural effusion. IMPRESSION: COPD without acute disease. Electronically Signed   By: Drusilla Kanner M.D.   On: 07/07/2015 16:23   Dg Chest Port 1  View  07/08/2015  CLINICAL DATA:  Shortness of breath, central chest pressure. EXAM: PORTABLE CHEST 1 VIEW COMPARISON:  Frontal lateral views 1 day prior. FINDINGS: Lungs remain hyperinflated. Patient is post median sternotomy. The heart size is normal. Pulmonary vasculature is normal. There is no consolidation, pulmonary edema, pleural effusion or pneumothorax. IMPRESSION: Stable hyperinflation.  No superimposed acute process. Electronically Signed   By: Rubye Oaks M.D.   On: 07/08/2015 03:28    Micro Results     No results found for this or any previous visit (from the past 240 hour(s)).     Today   Subjective:   [name] today has no headache,no chest or  abdominal pain,no new weakness tingling or numbness, feels much better wants to go home today.   Objective:   Blood pressure 158/65, pulse 104, temperature 98.2 F (36.8 C), temperature source Oral, resp. rate 18, height 5\' 1"  (1.549 m), weight 59.6 kg (131 lb 6.3 oz), SpO2 98 %.   Intake/Output Summary (Last 24 hours) at 07/09/15 1546 Last data filed at 07/09/15 1500  Gross per 24 hour  Intake    960 ml  Output   3475 ml  Net  -2515 ml    Exam Awake Alert, Oriented x 3, No new F.N deficits, Normal affect [place].AT,PERRAL Supple Neck,No JVD, No cervical lymphadenopathy appriciated.  Symmetrical Chest wall movement, Good air movement bilaterally, CTAB RRR,No Gallops,Rubs or new Murmurs, No Parasternal Heave +ve B.Sounds, Abd Soft, Non tender, No organomegaly appriciated, No rebound -guarding or rigidity. No Cyanosis, Clubbing or edema, No new Rash or bruise  Data Review   CBC w Diff:  Lab Results  Component Value Date   WBC 7.5 07/08/2015   HGB 11.3* 07/08/2015   HCT 33.9* 07/08/2015   PLT 216 07/08/2015   LYMPHOPCT 16 07/08/2015   MONOPCT 8 07/08/2015   EOSPCT 1 07/08/2015   BASOPCT 0 07/08/2015    CMP:  Lab Results  Component Value Date   NA 135 07/08/2015   K 4.2 07/08/2015   CL 102  07/08/2015   CO2 25 07/08/2015   BUN 11 07/08/2015   CREATININE 0.71 07/08/2015   PROT 6.5 07/08/2015   ALBUMIN 3.8 07/08/2015   BILITOT 0.2* 07/08/2015   ALKPHOS 58 07/08/2015   AST 19 07/08/2015   ALT 16* 07/08/2015  .   Total Time in preparing paper work, data evaluation and todays exam - 35 minutes  Anees Vanecek M.D on 07/09/2015 at 3:46 PM  [facility]   Office  [phone]

## 2015-07-09 NOTE — Consult Note (Signed)
CARDIOLOGY CONSULT NOTE   Patient ID: Reginald Ayers MRN: [id number] DOB/AGE: 66-Dec-1952 66 y.o.   Admit date: 07/08/2015  Primary Physician   Reginald Cowman, MD Primary Cardiologist   Dr Reginald Ayers Reason for Consultation   Chest pain  [id number] Reginald Ayers is a 64 y.o. year old male with a history of PAD (L-CIA & R-SFA stents), bilateral SFA dz w/ ABIs < 0.5, occluded L-ICA w/ mod R-ICA dz, DM, HTN, HL, Tob use, CABG 2010 (LIMA-LAD, Lrad-OM1, SVG-RI, SVG-AM-dRCA), neg MV 2015.   Admitted 11/14 w/ chest pain and cardiology asked to see.   On 11/13, Reginald Ayers was minimally active and had sudden onset of chest pressure, 8-9/10, worse with deep inspiration.  He was not nauseated or sweaty. He was coughing prior to onset. He took a SL NTG with no help. The pain lasted 5-10", EMS was called and he got better with a breathing treatment. The pressure eased and he was able to breathe. He went to the hospital, was treated and released.   After he went home, he began coughing again, had recurrence of the chest pressure and came back to the ER. Once again, he got better with a breathing treatment, but was admitted.  Since admission, he has not been coughing that hard, has gotten some sputum. Sputum has been white, yellow and dark yellow. He has had no more chest pain.   He has been coughing more lately, especially at night. He has woken up coughing in the night. Some sputum, yellow (light and dark). No fevers or chills, no LE edema.   The last episode of chest pain was about 6 months ago. Coughing more the night before, same symptom picture. D/c 12/07/2014. Dx at that time was felt to be GERD, but pt believes it was respiratory.   Past Medical History  Diagnosis Date  . Coronary artery disease     s/p CABG February 2010 by Dr. Andrey Spearman  . Peripheral arterial disease (HCC)     post left common iliac and right SFA stenting remotely  . Carotid artery disease (HCC)     L-ICA 100%, mod  R-ICA dz  . Diabetes (HCC)   . Hypertension   . Hyperlipidemia   . Tobacco abuse           Past Surgical History  Procedure Laterality Date  . Cardiac catheterization  2010  . Carotid angiogram  2014  . Doppler echocardiography  2010  . Nm myoview ltd  2015  . Lower extremity angiogram  11/2006    left common femoral endarterectomy and patch angioplasty:The mid right SFA was angioplastied with a 4 x 8  . Lower extremity angiogram  2003    left common iliac artery stenting by Dr. Erlene Quan  . Coronary artery bypass graft  2010    LIMA-LAD, Lrad-OM1, SVG-RI, SVG-AM-dRCA    No Known Allergies  I have reviewed the patient's current medications . albuterol  2.5 mg Nebulization TID  . amLODipine  2.5 mg Oral Daily  . antiseptic oral rinse  7 mL Mouth Rinse BID  . clopidogrel  75 mg Oral Daily  . enoxaparin (LOVENOX) injection  40 mg Subcutaneous Q24H  . guaiFENesin  1,200 mg Oral BID  . insulin aspart  0-15 Units Subcutaneous TID WC  . irbesartan  300 mg Oral Daily  . levofloxacin  750 mg Oral Daily  . [START ON 07/10/2015] predniSONE  40 mg Oral Daily  . tiotropium  18 mcg Inhalation Daily     acetaminophen, albuterol, morphine injection, nitroGLYCERIN, ondansetron (ZOFRAN) IV, traMADol  Prior to Admission medications   Medication Sig Start Date End Date Taking? Authorizing Provider  clopidogrel (PLAVIX) 75 MG tablet Take 75 mg by mouth daily.  07/24/13  Yes Historical Provider, MD  Cyanocobalamin (VITAMIN B-12 IJ) Inject 1 application as directed every 30 (thirty) days.    Yes Historical Provider, MD  glipiZIDE (GLUCOTROL XL) 10 MG 24 hr tablet Take 10 mg by mouth 2 (two) times daily.  07/24/13  Yes Historical Provider, MD  ibuprofen (ADVIL,MOTRIN) 200 MG tablet Take 600-800 mg by mouth every 6 (six) hours as needed for moderate pain.    Yes Historical Provider, MD  irbesartan (AVAPRO) 300 MG tablet Take 300 mg by mouth daily.  07/24/13  Yes Historical Provider, MD  metFORMIN  (GLUCOPHAGE) 500 MG tablet Take 1,000 mg by mouth 2 (two) times daily with a meal.  07/24/13  Yes Historical Provider, MD  naproxen sodium (ANAPROX) 220 MG tablet Take 220 mg by mouth 2 (two) times daily as needed (for pain, takes).   Yes Historical Provider, MD  NITROSTAT 0.4 MG SL tablet Take 1 tablet by mouth every 5 (five) minutes as needed for chest pain.  12/18/14  Yes Historical Provider, MD  pioglitazone (ACTOS) 30 MG tablet Take 30 mg by mouth daily.  07/24/13  Yes Historical Provider, MD  PROAIR HFA 108 (90 BASE) MCG/ACT inhaler Inhale 2 puffs into the lungs every 4 (four) hours as needed for wheezing or shortness of breath.  07/24/13  Yes Historical Provider, MD  SPIRIVA HANDIHALER 18 MCG inhalation capsule Place 18 mcg into inhaler and inhale daily.  08/16/13  Yes Historical Provider, MD  traMADol (ULTRAM) 50 MG tablet Take 50 mg by mouth every 12 (twelve) hours as needed for severe pain.  08/16/13  Yes Historical Provider, MD  predniSONE (DELTASONE) 10 MG tablet Take 6 tablets (60 mg total) by mouth daily. Patient not taking: Reported on 07/08/2015 07/07/15   Reginald KaplanAnkit Nanavati, MD     Social History   Social History  . Marital Status: Single    Spouse Name: N/A  . Number of Children: N/A  . Years of Education: N/A   Occupational History  . Retired Radiographer, [employer]    Social History Main Topics  . Smoking status: Smoker, Current Status Unknown  . Smokeless tobacco: Current User    Types: Chew  . Alcohol Use: No     Comment: Weekend drinker, quit 2000.   . Drug Use: No  . Sexual Activity: Not on file   Other Topics Concern  . Not on file   Social History Narrative   [place] 2 roommates, they both smoke. Has 9 dogs. Dogs do not smoke.    Family Status  Relation Status Death Age  . Mother Deceased   . Father Deceased 735    train accident  . Sister Alive    Family History  Problem Relation Age of Onset  . Heart attack Mother   . CVA Mother   . Diabetes Sister     . Hypertension Sister      ROS:  Full 14 point review of systems complete and found to be negative unless listed above.  Physical Exam: Blood pressure 158/65, pulse 104, temperature 98.2 F (36.8 C), temperature source Oral, resp. rate 18, height 5\' 1"  (1.549 m), weight 131 lb 6.3 oz (59.6 kg), SpO2 98 %.  General: Well  developed, well nourished, male in no acute distress Head: Eyes PERRLA, No xanthomas.   Normocephalic and atraumatic, oropharynx without edema or exudate. Dentition: no teeth Lungs: decreased BS w/ dry rales Heart: HRRR S1 S2, no rub/gallop, no murmur. pulses are 2+ both upper extrem, decreased but palpable both lower extrem Neck: No carotid bruits. No lymphadenopathy.  JVD minimally elevated. Abdomen: Bowel sounds present, abdomen soft and non-tender without masses or hernias noted. Msk:  No spine or cva tenderness. No weakness, no joint deformities or effusions. Extremities: No clubbing or cyanosis. Trace pedal edema.  Neuro: Alert and oriented X 3. No focal deficits noted. Psych:  Good affect, responds appropriately Skin: No rashes or lesions noted. LE skin very shiny  Labs:   Lab Results  Component Value Date   WBC 7.5 07/08/2015   HGB 11.3* 07/08/2015   HCT 33.9* 07/08/2015   MCV 92.1 07/08/2015   PLT 216 07/08/2015     Recent Labs Lab 07/08/15 0325  NA 135  K 4.2  CL 102  CO2 25  BUN 11  CREATININE 0.71  CALCIUM 9.4  PROT 6.5  BILITOT 0.2*  ALKPHOS 58  ALT 16*  AST 19  GLUCOSE 262*  ALBUMIN 3.8   MAGNESIUM  Date Value Ref Range Status  10/27/2008 1.9 1.5 - 2.5 mg/dL Final    Recent Labs  46/96/29 2043 07/08/15 1055 07/08/15 1530 07/08/15 2250  TROPONINI <0.03 <0.03 <0.03 <0.03    Recent Labs  07/08/15 0332  TROPIPOC 0.01   Lab Results  Component Value Date   CHOL 173 07/08/2015   HDL 55 07/08/2015   LDLCALC 109* 07/08/2015   TRIG 47 07/08/2015   CHOLHDL 3.1 07/08/2015    B NATRIURETIC PEPTIDE  Date/Time Value Ref  Range Status  07/08/2015 03:25 AM 260.4* 0.0 - 100.0 pg/mL Final  07/07/2015 04:04 PM 131.3* 0.0 - 100.0 pg/mL Final   Echo: 07/09/2015 - Left ventricle: The cavity size was normal. Wall thickness was increased in a pattern of mild LVH of the posterior wall. Systolic function was normal. The estimated ejection fraction was in the range of 60% to 65%. Akinesis of the apical myocardium. Dyskinesis of the apical anteroseptal myocardium and hypokinesis of the mid to apical anterior walls. Left ventricular diastolic function parameters were normal. - Aortic valve: Trileaflet; normal thickness, mildly calcifiedleaflets. - Mitral valve: Calcified annulus. - Right ventricle: The cavity size was normal. Wall thickness was normal. Systolic function was normal. - Inferior vena cava: The vessel was normal in size. The respirophasic diameter changes were in the normal range (= 50%), consistent with normal central venous pressure. - Wall motion abnormalities are enhanced by the use of echo contrast. It is challenging to determine whether they were present on the prior exam in 2015, which did not use echo contrast. Consider additional testing such as nuclear stress testing or invasive angiography in order to evaluate for ischemia if the clinical scenario is consistent with this. This could also be due to prior MI.  Echo: 01/08/2009 EF 45-50%, HK inferior and inferolateral walls, AK infero-apical wall   Myoview: 08/29/2013 Apical scar, no ischemia EF 55%  ECG:  07/08/2015 ST Vent. rate 114 BPM PR interval 161 ms QRS duration 117 ms QT/QTc 342/471 ms P-R-T axes 83 95 81  Radiology:  Dg Chest 2 View 07/07/2015  CLINICAL DATA:  Severe shortness of breath this morning.  Smoker. EXAM: CHEST  2 VIEW COMPARISON:  PA and lateral chest 12/06/2014 and 08/08/2012. FINDINGS: The patient  is status post CABG. The chest is hyperexpanded. There is some coarsening of the  pulmonary interstitium. No focal airspace disease identified. No pneumothorax or pleural effusion. IMPRESSION: COPD without acute disease. Electronically Signed   By: Reginald Ayers M.D.   On: 07/07/2015 16:23   Dg Chest Port 1 View 07/08/2015  CLINICAL DATA:  Shortness of breath, central chest pressure. EXAM: PORTABLE CHEST 1 VIEW COMPARISON:  Frontal lateral views 1 day prior. FINDINGS: Lungs remain hyperinflated. Patient is post median sternotomy. The heart size is normal. Pulmonary vasculature is normal. There is no consolidation, pulmonary edema, pleural effusion or pneumothorax. IMPRESSION: Stable hyperinflation.  No superimposed acute process. Electronically Signed   By: Reginald Ayers M.D.   On: 07/08/2015 03:28    ASSESSMENT AND PLAN:   The patient was seen today by Dr Reginald Ayers, the patient evaluated and the data reviewed.  Principal Problem:   Chest pain - symptoms are atypical - ez negative - NTG no help, nebs relieved SOB & CP - EF is normal, see reports of previous MV 2015 and echo 2010, above - suspect sx are mainly due to COPD exacerbation on top of CAD  - MD advise if repeat MV needed, think could be done as OP.    Active Problems:   Peripheral arterial disease (HCC) - pt was to have repeat ABIs around this time - sx have not changed at all, MD advise timing of testing    Carotid artery disease (HCC) - no stroke sx - per IM    Essential hypertension - BP is up - on ARB -  MD advise on adding BB or Norvasc for better BP control.    Hyperlipidemia - not on statin now - was supposed to be on Zocor 20 mg qd when Dr Reginald Ayers saw him last - will order lipid profile, generally followed by his primary MD    Tobacco abuse - cessation encouraged  Signed: Leanna Ayers 07/09/2015 3:19 PM Beeper [phone]  Co-Sign MD  I have examined the patient and reviewed assessment and plan and discussed with patient.  Agree with above as stated.  BP increased.  Consider  amlodipine 2.5 mg daily.  On on other antianginal therapy.  His CP is very atypical, only related to coughing and different from his prior angina which was an exertional burning.  No further cardiac testing at this time.  F/u with Dr. Allyson Ayers.  Statin for secondary prevention.  Reginald Perfecto S.

## 2015-07-09 NOTE — Progress Notes (Signed)
  Echocardiogram 2D Echocardiogram with Definity has been performed.  Tye SavoyCasey N Layken Beg 07/09/2015, 10:08 AM

## 2015-07-09 NOTE — Care Management Obs Status (Signed)
MEDICARE OBSERVATION STATUS NOTIFICATION   Patient Details  Name: Reginald GalaWilliam H Rinella MRN: [id number] Date of Birth: 1950-10-16   Medicare Observation Status Notification Given:  Yes    Cherylann ParrClaxton, Arnesha Schiraldi S, RN 07/09/2015, 9:51 AM

## 2015-07-09 NOTE — Discharge Instructions (Signed)
Follow with Primary MD [name], KAREN, MD in 7 days   Get CBC, CMP checked  by Primary MD next visit.    Activity: As tolerated with Full fall precautions use walker/cane & assistance as needed   Disposition Home    Diet: Heart Healthy  , with feeding assistance and aspiration precautions.  For Heart failure patients - Check your Weight same time everyday, if you gain over 2 pounds, or you develop in leg swelling, experience more shortness of breath or chest pain, call your Primary MD immediately. Follow Cardiac Low Salt Diet and 1.5 lit/day fluid restriction.   On your next visit with your primary care physician please Get Medicines reviewed and adjusted.   Please request your Prim.MD to go over all Hospital Tests and Procedure/Radiological results at the follow up, please get all Hospital records sent to your Prim MD by signing hospital release before you go home.   If you experience worsening of your admission symptoms, develop shortness of breath, life threatening emergency, suicidal or homicidal thoughts you must seek medical attention immediately by calling 911 or calling your MD immediately  if symptoms less severe.  You Must read complete instructions/literature along with all the possible adverse reactions/side effects for all the Medicines you take and that have been prescribed to you. Take any new Medicines after you have completely understood and accpet all the possible adverse reactions/side effects.   Do not drive, operating heavy machinery, perform activities at heights, swimming or participation in water activities or provide baby sitting services if your were admitted for syncope or siezures until you have seen by Primary MD or a Neurologist and advised to do so again.  Do not drive when taking Pain medications.    Do not take more than prescribed Pain, Sleep and Anxiety Medications  Special Instructions: If you have smoked or chewed Tobacco  in the last 2 yrs  please stop smoking, stop any regular Alcohol  and or any Recreational drug use.  Wear Seat belts while driving.   Please note  You were cared for by a hospitalist during your hospital stay. If you have any questions about your discharge medications or the care you received while you were in the hospital after you are discharged, you can call the unit and asked to speak with the hospitalist on call if the hospitalist that took care of you is not available. Once you are discharged, your primary care physician will handle any further medical issues. Please note that NO REFILLS for any discharge medications will be authorized once you are discharged, as it is imperative that you return to your primary care physician (or establish a relationship with a primary care physician if you do not have one) for your aftercare needs so that they can reassess your need for medications and monitor your lab values.

## 2015-07-09 NOTE — Care Management Note (Addendum)
Case Management Note  Patient Details  Name: Reginald Ayers MRN: [id number] Date of Birth: July 05, 1951  Subjective/Objective:    Pt admitted with CP                Action/Plan:  Pt is independent from home with 2 friends.  Pt states he already has walker and cane if needed.  MD requested CM to arrange for home nebulizer machine prior to discharge   Expected Discharge Date:                  Expected Discharge Plan:  Home/Self Care (Pt states he is indpendent from home with walker and cane uses periodically for assistance with ambulation.  Pt stays with 2 friends that can assisst him wihen discharged)  In-House Referral:     Discharge planning Services  CM Consult  Post Acute Care Choice:    Choice offered to:     DME Arranged:   [facility]  HH Arranged:    HH Agency:     Status of Service:   IComplete, will sign off  Medicare Important Message Given:    Date Medicare IM Given:    Medicare IM give by:    Date Additional Medicare IM Given:    Additional Medicare Important Message give by:     If discussed at Long Length of Stay Meetings, dates discussed:    Additional Comments: CM assessed pt.  [facility] contacted and referral accepted Cherylann ParrClaxton, Brant Peets S, RN 07/09/2015, 9:49 AM

## 2015-07-09 NOTE — Progress Notes (Deleted)
Patient Demographics  Reginald Ayers, is a 64 y.o. male, DOB - June 03, 1951, [id number]  Admit date - 07/08/2015   Admitting Physician [name], MD  Outpatient Primary MD for the patient is [name], KAREN, MD  LOS -    Chief Complaint  Patient presents with  . Shortness of Breath      Brief narrative: Mr. Reginald Ayers is a 64 year old male with a past medical history significant for CABG, CAD, PAD, diabetes, hypertension, continued tobacco abuse; who presents with complaints of chest tightness and shortness of breath , chest tightness thought secondary to COPD exacerbation, patient had negative troponins 3, 2-D echo showing regional wall motion abnormality,Cardiology service consulted, COPD appears to be improving on oral prednisone.  Subjective:   Reginald Reginald Ayers today has, No headache, , No abdominal pain - No Nausea, No new weakness tingling or numbness, reports dyspnea much improved, still coughing, nonproductive, reports chest soreness on coughing.  Assessment & Plan    Principal Problem:   Chest pain Active Problems:   Peripheral arterial disease (HCC)   Carotid artery disease (HCC)   Essential hypertension   Hyperlipidemia   Tobacco abuse  Chest pain - Has Nontypical features, described it more as soreness related to cough. - Patient has negative troponins 3,  - 2-D echo 07/08/2014 showing abnormal regional wall motion abnormality including Akinesis of the apical myocardium, Dyskinesis of the apical anteroseptal myocardium and hypokinesis of the mid to apical anterior, will consult cardiology especially given his significant cardiac history , will await their recommendation.  COPD exacerbation - Wheezing significantly improved will taper prednisone , continue with levofloxacin . -  continue with nebs, spiriva, will start on mucinex and flutter valve.  History of coronary artery  disease -  continue with Plavix, statin, irbesartan, does not appear to be in any beta blockers, won't start at this point given his history of COPD.  Hypertension - Continue with irbesartan  Tobacco abuse - Counseled  Peripheral arterial disease - Stable, followed by Dr. Allyson SabalBerry.  Code Status: Full  Family Communication: None at bedside  Disposition Plan: Pending further workup   Procedures  None   Consults   Cardiology   Medications  Scheduled Meds: . albuterol  2.5 mg Nebulization TID  . antiseptic oral rinse  7 mL Mouth Rinse BID  . clopidogrel  75 mg Oral Daily  . enoxaparin (LOVENOX) injection  40 mg Subcutaneous Q24H  . guaiFENesin  1,200 mg Oral BID  . insulin aspart  0-15 Units Subcutaneous TID WC  . irbesartan  300 mg Oral Daily  . levofloxacin  750 mg Oral Daily  . predniSONE  60 mg Oral Daily  . tiotropium  18 mcg Inhalation Daily   Continuous Infusions:  PRN Meds:.acetaminophen, albuterol, morphine injection, nitroGLYCERIN, ondansetron (ZOFRAN) IV, traMADol  DVT Prophylaxis  Lovenox -   Lab Results  Component Value Date   PLT 216 07/08/2015    Antibiotics    Anti-infectives    Start     Dose/Rate Route Frequency Ordered Stop   07/08/15 1200  levofloxacin (LEVAQUIN) tablet 750 mg     750 mg Oral Daily 07/08/15 1131            Objective:   Filed Vitals:  07/08/15 2120 07/09/15 0052 07/09/15 0440 07/09/15 0946  BP: 148/71  137/48   Pulse: 97  91 88  Temp: 97.9 F (36.6 C)  97.9 F (36.6 C)   TempSrc: Oral  Oral   Resp: Height:      Weight:      SpO2: 99% 98% 97% 96%    Wt Readings from Last 3 Encounters:  07/08/15 59.6 kg (131 lb 6.3 oz)  01/30/15 61.236 kg (135 lb)  01/01/15 62.143 kg (137 lb)     Intake/Output Summary (Last 24 hours) at 07/09/15 1240 Last data filed at 07/09/15 1123  Gross per 24 hour  Intake   1200 ml  Output   2800 ml  Net  -1600 ml     Physical Exam  Awake Alert, Oriented X 3, No  new F.N deficits, Normal affect [place].AT,PERRAL Supple Neck,No JVD, No cervical lymphadenopathy appriciated.  Symmetrical Chest wall movement, Good air movement bilaterally,  RRR,No Gallops,Rubs or new Murmurs, No Parasternal Heave +ve B.Sounds, Abd Soft, No tenderness, No organomegaly appriciated, No rebound - guarding or rigidity. No Cyanosis, Clubbing or edema, No new Rash or bruise     Data Review   Micro Results No results found for this or any previous visit (from the past 240 hour(s)).  Radiology Reports Dg Chest 2 View  07/07/2015  CLINICAL DATA:  Severe shortness of breath this morning.  Smoker. EXAM: CHEST  2 VIEW COMPARISON:  PA and lateral chest 12/06/2014 and 08/08/2012. FINDINGS: The patient is status post CABG. The chest is hyperexpanded. There is some coarsening of the pulmonary interstitium. No focal airspace disease identified. No pneumothorax or pleural effusion. IMPRESSION: COPD without acute disease. Electronically Signed   By: Drusilla Kanner M.D.   On: 07/07/2015 16:23   Dg Chest Port 1 View  07/08/2015  CLINICAL DATA:  Shortness of breath, central chest pressure. EXAM: PORTABLE CHEST 1 VIEW COMPARISON:  Frontal lateral views 1 day prior. FINDINGS: Lungs remain hyperinflated. Patient is post median sternotomy. The heart size is normal. Pulmonary vasculature is normal. There is no consolidation, pulmonary edema, pleural effusion or pneumothorax. IMPRESSION: Stable hyperinflation.  No superimposed acute process. Electronically Signed   By: Rubye Oaks M.D.   On: 07/08/2015 03:28     CBC  Recent Labs Lab 07/07/15 1604 07/08/15 0325  WBC 8.0 7.5  HGB 11.5* 11.3*  HCT 34.9* 33.9*  PLT 235 216  MCV 92.8 92.1  MCH 30.6 30.7  MCHC 33.0 33.3  RDW 14.0 14.0  LYMPHSABS 1.6 1.2  MONOABS 0.9 0.6  EOSABS 0.1 0.1  BASOSABS 0.0 0.0    Chemistries   Recent Labs Lab 07/07/15 1604 07/08/15 0325  NA 134* 135  K 4.0 4.2  CL 99* 102  CO2 24 25  GLUCOSE 223*  262*  BUN 13 11  CREATININE 0.78 0.71  CALCIUM 9.3 9.4  AST  --  19  ALT  --  16*  ALKPHOS  --  58  BILITOT  --  0.2*   ------------------------------------------------------------------------------------------------------------------ estimated creatinine clearance is 67.2 mL/min (by C-G formula based on Cr of 0.71). ------------------------------------------------------------------------------------------------------------------ No results for input(s): HGBA1C in the last 72 hours. ------------------------------------------------------------------------------------------------------------------ No results for input(s): CHOL, HDL, LDLCALC, TRIG, CHOLHDL, LDLDIRECT in the last 72 hours. ------------------------------------------------------------------------------------------------------------------ No results for input(s): TSH, T4TOTAL, T3FREE, THYROIDAB in the last 72 hours.  Invalid input(s): FREET3 ------------------------------------------------------------------------------------------------------------------ No results for input(s): VITAMINB12, FOLATE, FERRITIN, TIBC, IRON, RETICCTPCT in the last 72 hours.  Coagulation profile No results for input(s): INR, PROTIME in the last 168 hours.  No results for input(s): DDIMER in the last 72 hours.  Cardiac Enzymes  Recent Labs Lab 07/08/15 1055 07/08/15 1530 07/08/15 2250  TROPONINI <0.03 <0.03 <0.03   ------------------------------------------------------------------------------------------------------------------ Invalid input(s): POCBNP     Time Spent in minutes  No charge   Randol Kern, Mieko Kneebone M.D on 07/09/2015 at 12:40 PM  Between 7am to 7pm - Pager - [phone]  After 7pm go to [email or web address] - password [facility]  [facility]   Office  [phone]

## 2015-07-16 DIAGNOSIS — I739 Peripheral vascular disease, unspecified: Secondary | ICD-10-CM | POA: Diagnosis not present

## 2015-07-16 DIAGNOSIS — E1151 Type 2 diabetes mellitus with diabetic peripheral angiopathy without gangrene: Secondary | ICD-10-CM | POA: Diagnosis not present

## 2015-07-16 DIAGNOSIS — J441 Chronic obstructive pulmonary disease with (acute) exacerbation: Secondary | ICD-10-CM | POA: Diagnosis not present

## 2015-07-16 DIAGNOSIS — I2510 Atherosclerotic heart disease of native coronary artery without angina pectoris: Secondary | ICD-10-CM | POA: Diagnosis not present

## 2015-07-16 DIAGNOSIS — E11319 Type 2 diabetes mellitus with unspecified diabetic retinopathy without macular edema: Secondary | ICD-10-CM | POA: Diagnosis not present

## 2015-07-16 DIAGNOSIS — I10 Essential (primary) hypertension: Secondary | ICD-10-CM | POA: Diagnosis not present

## 2015-07-16 DIAGNOSIS — Z7984 Long term (current) use of oral hypoglycemic drugs: Secondary | ICD-10-CM | POA: Diagnosis not present

## 2015-07-30 ENCOUNTER — Ambulatory Visit (INDEPENDENT_AMBULATORY_CARE_PROVIDER_SITE_OTHER): Payer: Medicare Other | Admitting: Cardiology

## 2015-07-30 ENCOUNTER — Encounter: Payer: Self-pay | Admitting: Cardiology

## 2015-07-30 ENCOUNTER — Ambulatory Visit (HOSPITAL_COMMUNITY)
Admission: RE | Admit: 2015-07-30 | Discharge: 2015-07-30 | Disposition: A | Discharge status: Home / Self Care | Payer: Medicare Other | Source: Ambulatory Visit | Attending: Cardiology | Admitting: Cardiology

## 2015-07-30 VITALS — BP 148/74 | HR 94 | Ht 61.0 in | Wt 141.4 lb

## 2015-07-30 DIAGNOSIS — E119 Type 2 diabetes mellitus without complications: Secondary | ICD-10-CM | POA: Diagnosis not present

## 2015-07-30 DIAGNOSIS — E785 Hyperlipidemia, unspecified: Secondary | ICD-10-CM | POA: Insufficient documentation

## 2015-07-30 DIAGNOSIS — F17200 Nicotine dependence, unspecified, uncomplicated: Secondary | ICD-10-CM | POA: Insufficient documentation

## 2015-07-30 DIAGNOSIS — I6523 Occlusion and stenosis of bilateral carotid arteries: Secondary | ICD-10-CM | POA: Diagnosis not present

## 2015-07-30 DIAGNOSIS — I10 Essential (primary) hypertension: Secondary | ICD-10-CM | POA: Diagnosis not present

## 2015-07-30 DIAGNOSIS — I779 Disorder of arteries and arterioles, unspecified: Secondary | ICD-10-CM | POA: Insufficient documentation

## 2015-07-30 DIAGNOSIS — I25810 Atherosclerosis of coronary artery bypass graft(s) without angina pectoris: Secondary | ICD-10-CM

## 2015-07-30 DIAGNOSIS — I739 Peripheral vascular disease, unspecified: Secondary | ICD-10-CM

## 2015-07-30 DIAGNOSIS — K219 Gastro-esophageal reflux disease without esophagitis: Secondary | ICD-10-CM

## 2015-07-30 DIAGNOSIS — Z951 Presence of aortocoronary bypass graft: Secondary | ICD-10-CM | POA: Diagnosis not present

## 2015-07-30 DIAGNOSIS — I6529 Occlusion and stenosis of unspecified carotid artery: Secondary | ICD-10-CM

## 2015-07-30 DIAGNOSIS — J441 Chronic obstructive pulmonary disease with (acute) exacerbation: Secondary | ICD-10-CM

## 2015-07-30 MED ORDER — PANTOPRAZOLE SODIUM 40 MG PO TBEC: 40.0000 mg | DELAYED_RELEASE_TABLET | Freq: Every day | ORAL | Status: DC

## 2015-07-30 NOTE — Assessment & Plan Note (Signed)
LDL 109 Nov 2016 on low dose statin

## 2015-07-30 NOTE — Assessment & Plan Note (Signed)
S/P multiple PTA, dopplers May 2016 suggest bilateral SFA occlusion but pt currently asymptomatic Occluded LICA, moderate RICA- asymptomatic

## 2015-07-30 NOTE — Progress Notes (Signed)
07/30/2015 Reginald Ayers   1951/06/06  [id number]  Primary Physician Katy Apo, MD Primary Cardiologist: Dr Allyson Sabal  HPI:  64 year old (looks older), thin-appearing, single Caucasian male with no children followed by Dr berry with a history of CAD and PAD. He had a left common iliac artery in December 2003 and right SFA in April of 2008. He ABIs were 0.48 on the right and 0.52 on the left, but he really denies claudication. He has known carotid disease with occluded left internal carotid artery and moderate right ICA stenosis, which has remained ultrasonographically stable. He is neurologically asymptomatic. His other problems include diabetes, hypertension, hyperlipidemia, and long-term tobacco abuse, smoking one-half pack per day.          He had CABG by Dr. Andrey Spearman in February of 2010 with LIMA to his LAD, left radial to the first obtuse marginal branch, vein to ramus intermedius, and a sequential vein to an acute marginal and distal right coronary artery. Myoview performed in Jan 2015 was nonischemic. .Dr. Earl Gala follows his lipid profile.           He was recently admitted with dyspnea felt to be COPD exacerbation. Echo showed an EF of 60-65%. Troponin's were negative. His BNP was 131. He was treated with steroids and ABs. He is in the office today for follow up. He says he is doing better since discharge. He tells me he has stopped smoking. He denies chest pain. He does have reflux symptoms.   Current Outpatient Prescriptions  Medication Sig Dispense Refill  . albuterol (PROVENTIL) (2.5 MG/3ML) 0.083% nebulizer solution Take 3 mLs (2.5 mg total) by nebulization 3 (three) times daily. 75 mL 12  . amLODipine (NORVASC) 2.5 MG tablet Take 1 tablet (2.5 mg total) by mouth daily. 30 tablet 0  . clopidogrel (PLAVIX) 75 MG tablet Take 75 mg by mouth daily.     . Cyanocobalamin (VITAMIN B-12 IJ) Inject 1 application as directed every 30 (thirty) days.     Marland Kitchen glipiZIDE (GLUCOTROL XL)  10 MG 24 hr tablet Take 10 mg by mouth 2 (two) times daily.     . irbesartan (AVAPRO) 300 MG tablet Take 300 mg by mouth daily.     Marland Kitchen levofloxacin (LEVAQUIN) 500 MG tablet Take 1 tablet (500 mg total) by mouth daily. Take 4 days then stop 4 tablet 0  . metFORMIN (GLUCOPHAGE) 500 MG tablet Take 1,000 mg by mouth 2 (two) times daily with a meal.     . NITROSTAT 0.4 MG SL tablet Take 1 tablet by mouth every 5 (five) minutes as needed for chest pain.   0  . pantoprazole (PROTONIX) 40 MG tablet Take 1 tablet (40 mg total) by mouth daily. 30 tablet 6  . pioglitazone (ACTOS) 30 MG tablet Take 30 mg by mouth daily.     . predniSONE (DELTASONE) 10 MG tablet Please take 4 tablets  X 3 day, then 3 tablets  X 3 days, then 2 tablets  X3 days, then 1 tablet  X 3 days then stop. 30 tablet 0  . PROAIR HFA 108 (90 BASE) MCG/ACT inhaler Inhale 2 puffs into the lungs every 4 (four) hours as needed for wheezing or shortness of breath.     . simvastatin (ZOCOR) 20 MG tablet Take 1 tablet (20 mg total) by mouth daily. 30 tablet 1  . SPIRIVA HANDIHALER 18 MCG inhalation capsule Place 18 mcg into inhaler and inhale daily.     . traMADol Janean Sark)  50 MG tablet Take 50 mg by mouth every 12 (twelve) hours as needed for severe pain.      No current facility-administered medications for this visit.    No Known Allergies  Social History   Social History  . Marital Status: Single    Spouse Name: N/A  . Number of Children: N/A  . Years of Education: N/A   Occupational History  . Retired Radiographer, [employer]    Social History Main Topics  . Smoking status: Smoker, Current Status Unknown  . Smokeless tobacco: Current User    Types: Chew  . Alcohol Use: No     Comment: Weekend drinker, quit 2000.   . Drug Use: No  . Sexual Activity: Not on file   Other Topics Concern  . Not on file   Social History Narrative   [place] roommates, they both smoke. Has 9 dogs.     Review of Systems: General: negative for  chills, fever, night sweats or weight changes.  Cardiovascular: negative for chest pain, dyspnea on exertion, edema, orthopnea, palpitations, paroxysmal nocturnal dyspnea or shortness of breath Dermatological: negative for rash Respiratory: negative for cough or wheezing Urologic: negative for hematuria Abdominal: negative for nausea, vomiting, diarrhea, bright red blood per rectum, melena, or hematemesis Neurologic: negative for visual changes, syncope, or dizziness All other systems reviewed and are otherwise negative except as noted above.    Blood pressure 148/74, pulse 94, height 5\' 1"  (1.549 m), weight 141 lb 6.4 oz (64.139 kg).  General appearance: alert, cooperative, appears older than stated age and no distress Neck: no JVD and Rt CA bruit Lungs: clear to auscultation bilaterally Heart: regular rate and rhythm Extremities: red, tender LE Pulses: diminnished pulses Skin: pale cool dry Neurologic: Grossly normal  EKG NSR, incomplete RBBB, RAD  ASSESSMENT AND PLAN:   COPD exacerbation (HCC) Just discharged after COPD exacerbation  Peripheral arterial disease (HCC) S/P multiple PTA, dopplers May 2016 suggest bilateral SFA occlusion but pt currently asymptomatic Occluded LICA, moderate RICA- asymptomatic  Hyperlipidemia LDL 109 Nov 2016 on low dose statin   Essential hypertension Treated  GERD (gastroesophageal reflux disease) Add PPI  Hx of CABG Feb 2010 CABG x 5, low risk Myoview Jan 2015, currently no anginal symptoms    PLAN  Myoview was mentioned when he was seen in the hospital but the pt is currently doing well. Will add PPI, he clearly has GERD symptoms with cough at night, and reflux of bile. F/U Dr Allyson SabalBerry 4 weeks. If any chest pain consider proceeding with Myoview (Lexiscan as he currently has no wheezing).    Corine ShelterKILROY,Jodeci Roarty K PA-C 07/30/2015 12:27 PM

## 2015-07-30 NOTE — Assessment & Plan Note (Signed)
Treated

## 2015-07-30 NOTE — Assessment & Plan Note (Signed)
Just discharged after COPD exacerbation

## 2015-07-30 NOTE — Assessment & Plan Note (Signed)
Add PPI

## 2015-07-30 NOTE — Patient Instructions (Signed)
Your physician has recommended you make the following change in your medication: start new prescription for pantoprazole 40 mg. [facility]  Your physician wants you to follow-up in: 6 months or sooner if needed with Dr. Allyson SabalBerry. You will receive a reminder letter in the mail two months in advance. If you don't receive a letter, please call our office to schedule the follow-up appointment.  If you need a refill on your cardiac medications before your next appointment, please call your pharmacy.

## 2015-07-30 NOTE — Assessment & Plan Note (Signed)
CABG x 5, low risk Myoview Jan 2015, currently no anginal symptoms

## 2015-11-12 ENCOUNTER — Other Ambulatory Visit: Payer: Self-pay | Admitting: *Deleted

## 2015-11-12 DIAGNOSIS — Z7984 Long term (current) use of oral hypoglycemic drugs: Secondary | ICD-10-CM | POA: Diagnosis not present

## 2015-11-12 DIAGNOSIS — L03115 Cellulitis of right lower limb: Secondary | ICD-10-CM | POA: Diagnosis not present

## 2015-11-12 DIAGNOSIS — E11319 Type 2 diabetes mellitus with unspecified diabetic retinopathy without macular edema: Secondary | ICD-10-CM | POA: Diagnosis not present

## 2015-11-12 DIAGNOSIS — D510 Vitamin B12 deficiency anemia due to intrinsic factor deficiency: Secondary | ICD-10-CM | POA: Diagnosis not present

## 2015-11-12 DIAGNOSIS — E7800 Pure hypercholesterolemia, unspecified: Secondary | ICD-10-CM | POA: Diagnosis not present

## 2015-11-12 DIAGNOSIS — J440 Chronic obstructive pulmonary disease with acute lower respiratory infection: Secondary | ICD-10-CM | POA: Diagnosis not present

## 2015-11-12 DIAGNOSIS — E1151 Type 2 diabetes mellitus with diabetic peripheral angiopathy without gangrene: Secondary | ICD-10-CM | POA: Diagnosis not present

## 2015-11-12 MED ORDER — PANTOPRAZOLE SODIUM 40 MG PO TBEC: 40.0000 mg | DELAYED_RELEASE_TABLET | Freq: Every day | ORAL | Status: DC

## 2015-11-13 ENCOUNTER — Ambulatory Visit (INDEPENDENT_AMBULATORY_CARE_PROVIDER_SITE_OTHER): Payer: Medicare Other | Admitting: Ophthalmology

## 2015-11-13 DIAGNOSIS — E11319 Type 2 diabetes mellitus with unspecified diabetic retinopathy without macular edema: Secondary | ICD-10-CM | POA: Diagnosis not present

## 2015-11-13 DIAGNOSIS — I10 Essential (primary) hypertension: Secondary | ICD-10-CM | POA: Diagnosis not present

## 2015-11-13 DIAGNOSIS — H43813 Vitreous degeneration, bilateral: Secondary | ICD-10-CM

## 2015-11-13 DIAGNOSIS — H35033 Hypertensive retinopathy, bilateral: Secondary | ICD-10-CM | POA: Diagnosis not present

## 2015-11-13 DIAGNOSIS — E113291 Type 2 diabetes mellitus with mild nonproliferative diabetic retinopathy without macular edema, right eye: Secondary | ICD-10-CM | POA: Diagnosis not present

## 2015-11-13 DIAGNOSIS — E113592 Type 2 diabetes mellitus with proliferative diabetic retinopathy without macular edema, left eye: Secondary | ICD-10-CM

## 2015-11-13 DIAGNOSIS — H353132 Nonexudative age-related macular degeneration, bilateral, intermediate dry stage: Secondary | ICD-10-CM | POA: Diagnosis not present

## 2015-11-19 ENCOUNTER — Other Ambulatory Visit: Payer: Self-pay | Admitting: *Deleted

## 2015-11-19 ENCOUNTER — Telehealth: Payer: Self-pay

## 2015-11-19 MED ORDER — PANTOPRAZOLE SODIUM 40 MG PO TBEC: 40.0000 mg | DELAYED_RELEASE_TABLET | Freq: Every day | ORAL | Status: DC

## 2015-11-19 NOTE — Telephone Encounter (Signed)
Prior auth for Pantoprazole 40mg  approved through [employer] FEP. It is good through 11/18/2016. Pharmacy notified.

## 2015-12-17 DIAGNOSIS — R600 Localized edema: Secondary | ICD-10-CM | POA: Diagnosis not present

## 2015-12-17 DIAGNOSIS — D510 Vitamin B12 deficiency anemia due to intrinsic factor deficiency: Secondary | ICD-10-CM | POA: Diagnosis not present

## 2015-12-17 DIAGNOSIS — J441 Chronic obstructive pulmonary disease with (acute) exacerbation: Secondary | ICD-10-CM | POA: Diagnosis not present

## 2016-01-02 DIAGNOSIS — Z7984 Long term (current) use of oral hypoglycemic drugs: Secondary | ICD-10-CM | POA: Diagnosis not present

## 2016-01-02 DIAGNOSIS — E11319 Type 2 diabetes mellitus with unspecified diabetic retinopathy without macular edema: Secondary | ICD-10-CM | POA: Diagnosis not present

## 2016-01-02 DIAGNOSIS — I8310 Varicose veins of unspecified lower extremity with inflammation: Secondary | ICD-10-CM | POA: Diagnosis not present

## 2016-01-02 DIAGNOSIS — Z125 Encounter for screening for malignant neoplasm of prostate: Secondary | ICD-10-CM | POA: Diagnosis not present

## 2016-01-02 DIAGNOSIS — Z0000 Encounter for general adult medical examination without abnormal findings: Secondary | ICD-10-CM | POA: Diagnosis not present

## 2016-01-02 DIAGNOSIS — Z1389 Encounter for screening for other disorder: Secondary | ICD-10-CM | POA: Diagnosis not present

## 2016-01-02 DIAGNOSIS — E1151 Type 2 diabetes mellitus with diabetic peripheral angiopathy without gangrene: Secondary | ICD-10-CM | POA: Diagnosis not present

## 2016-01-02 DIAGNOSIS — I739 Peripheral vascular disease, unspecified: Secondary | ICD-10-CM | POA: Diagnosis not present

## 2016-01-17 DIAGNOSIS — D510 Vitamin B12 deficiency anemia due to intrinsic factor deficiency: Secondary | ICD-10-CM | POA: Diagnosis not present

## 2016-02-12 ENCOUNTER — Encounter: Payer: Self-pay | Admitting: Cardiovascular Disease

## 2016-02-12 ENCOUNTER — Ambulatory Visit (INDEPENDENT_AMBULATORY_CARE_PROVIDER_SITE_OTHER): Payer: Medicare Other | Admitting: Cardiovascular Disease

## 2016-02-12 VITALS — BP 200/78 | HR 112 | Ht 61.0 in | Wt 130.0 lb

## 2016-02-12 DIAGNOSIS — I779 Disorder of arteries and arterioles, unspecified: Secondary | ICD-10-CM

## 2016-02-12 DIAGNOSIS — E785 Hyperlipidemia, unspecified: Secondary | ICD-10-CM

## 2016-02-12 DIAGNOSIS — I10 Essential (primary) hypertension: Secondary | ICD-10-CM | POA: Diagnosis not present

## 2016-02-12 DIAGNOSIS — Z720 Tobacco use: Secondary | ICD-10-CM

## 2016-02-12 DIAGNOSIS — Z951 Presence of aortocoronary bypass graft: Secondary | ICD-10-CM

## 2016-02-12 DIAGNOSIS — I739 Peripheral vascular disease, unspecified: Secondary | ICD-10-CM

## 2016-02-12 NOTE — Progress Notes (Signed)
02/12/2016 Reginald Ayers   03/16/1951  [id number]  Primary Physician Katy Apo, MD Primary Cardiologist: Runell Gess MD FACP, [facility], [place], [place]  HPI:  .The patient is a 65 year old, thin-appearing, single Caucasian male with no children whom I last saw 11 months ago. He has a history of CAD and PAD.I  last saw him in the office 01/30/15. I stented his proximal left common iliac artery in December 2003 and right SFA in April of 2008. He does have significant infrapopliteal disease bilaterally and known occluded left SFA. He also has an occluded right SFA by Dopplers in our office last September was earlier this month.. His ABIs were 0.48 on the right and 0.52 on the left, but he really denies claudication. He has known carotid disease with occluded left internal carotid artery and moderate right ICA stenosis, which has remained ultrasonographically stable. He is neurologically asymptomatic. His other problems include diabetes, hypertension, hyperlipidemia, and long-term tobacco abuse, smoking one-half pack per day. He had coronary artery bypass grafting by Dr. Andrey Spearman in February of 2010 with LIMA to his LAD, left radial to the first obtuse marginal branch, vein to ramus intermedius, and a sequential vein to an acute marginal and distal right coronary artery. Myoview performed in May of 2010 was nonischemic and showed apical scar consistent with his wall motion abnormality and his known occluded LAD.Dr. Earl Gala follows his lipid profile.  Since I saw him back a year ago he's had one episode of chest pain requiring brief admission in April for rule out MI. He did have a negative Myoview 08/29/13. He had lower extremity arterial Doppler studies performed 01/07/15 revealing a right ABI 0.4 and a left 0.5 occluded SFAs bilaterally. He denies claudication.last 6 months he developed an ischemic-appearing ulcer on his right pretibial area..   Current Outpatient Prescriptions  Medication  Sig Dispense Refill  . amLODipine (NORVASC) 2.5 MG tablet Take 1 tablet (2.5 mg total) by mouth daily. 30 tablet 0  . clopidogrel (PLAVIX) 75 MG tablet Take 75 mg by mouth daily.     . Cyanocobalamin (VITAMIN B-12 IJ) Inject 1 application as directed every 30 (thirty) days.     Marland Kitchen glipiZIDE (GLUCOTROL XL) 10 MG 24 hr tablet Take 10 mg by mouth 2 (two) times daily.     . irbesartan (AVAPRO) 300 MG tablet Take 300 mg by mouth daily.     . metFORMIN (GLUCOPHAGE) 500 MG tablet Take 1,000 mg by mouth 2 (two) times daily with a meal.     . NITROSTAT 0.4 MG SL tablet Take 1 tablet by mouth every 5 (five) minutes as needed for chest pain.   0  . pantoprazole (PROTONIX) 40 MG tablet Take 1 tablet (40 mg total) by mouth daily. NEED OV. 30 tablet 0  . predniSONE (DELTASONE) 10 MG tablet Please take 4 tablets  X 3 day, then 3 tablets  X 3 days, then 2 tablets  X3 days, then 1 tablet  X 3 days then stop. 30 tablet 0  . PROAIR HFA 108 (90 BASE) MCG/ACT inhaler Inhale 2 puffs into the lungs every 4 (four) hours as needed for wheezing or shortness of breath.     . simvastatin (ZOCOR) 20 MG tablet Take 1 tablet (20 mg total) by mouth daily. 30 tablet 1  . SPIRIVA HANDIHALER 18 MCG inhalation capsule Place 18 mcg into inhaler and inhale daily.     . traMADol (ULTRAM) 50 MG tablet Take 50 mg by  mouth every 12 (twelve) hours as needed for severe pain.      No current facility-administered medications for this visit.    No Known Allergies  Social History   Social History  . Marital Status: Single    Spouse Name: N/A  . Number of Children: N/A  . Years of Education: N/A   Occupational History  . Retired Radiographer, therapeutic    Social History Main Topics  . Smoking status: Smoker, Current Status Unknown  . Smokeless tobacco: Current User    Types: Chew  . Alcohol Use: No     Comment: Weekend drinker, quit 2000.   . Drug Use: No  . Sexual Activity: Not on file   Other Topics Concern  . Not on file   Social History  Narrative   Lives in [place]. Has 2 roommates, they both smoke. Has 9 dogs.     Review of Systems: General: negative for chills, fever, night sweats or weight changes.  Cardiovascular: negative for chest pain, dyspnea on exertion, edema, orthopnea, palpitations, paroxysmal nocturnal dyspnea or shortness of breath Dermatological: negative for rash Respiratory: negative for cough or wheezing Urologic: negative for hematuria Abdominal: negative for nausea, vomiting, diarrhea, bright red blood per rectum, melena, or hematemesis Neurologic: negative for visual changes, syncope, or dizziness All other systems reviewed and are otherwise negative except as noted above.    Blood pressure 200/78, pulse 112, height 5\' 1"  (1.549 m), weight 130 lb (58.968 kg).  General appearance: alert and no distress Neck: no adenopathy, no carotid bruit, no JVD, supple, symmetrical, trachea midline and thyroid not enlarged, symmetric, no tenderness/mass/nodules Lungs: clear to auscultation bilaterally Heart: regular rate and rhythm, S1, S2 normal, no murmur, click, rub or gallop Extremities: ischemic-appearing ulcer approximately 2-1/2 cm in diameter on his right pretibial area  EKG sinus tachycardia 112 with incomplete right bundle-branch block. I personally reviewed this EKG There were septal Q waves noted as well.  ASSESSMENT AND PLAN:   Hx of CABG Feb 2010 History of coronary artery disease status post bypass grafting by Dr. Damien Fusi in February 2010 with a LIMA to his LAD, left radial to the first obtuse marginal branch, vein to the ramus intermedius branch and sequential vein to acute marginal and to the first obtuse marginal branch and distal RCA. A Myoview performed 08/29/13 was nonischemic He denies chest pain or shortness of breath.  Peripheral arterial disease (HCC) History of peripheral arterial disease status post left common iliac artery stenting by myself 08/18/02 with a known occluded  left SFA. I stented his right SFA in 2008 with Dopplers performed a year ago revealing a right ABI 0.4 with an occluded right SFA as well. He now has an ischemic ulcer on his right pretibial region for the last 6 months.I'm going to refer him over to the wound care center for aggressive wound care. If they are unable to promote healing he will need angiography and potential percutaneous revascularization.  Carotid artery disease (HCC) History of carotid artery disease with known occluded left internal carotid artery and moderate right ICA stenosis by  Duplex ultrasound 07/30/15.  Essential hypertension history of hypertension blood pressure measured at 200/78. He is on amlodipine and Avapro. Continued current meds at current dosing  Hyperlipidemia History of hyperlipidemia on statin therapy followed by his PCP. His last lipid profile in our chart performed 07/08/15 revealed total cholesterol 173, LDL 109 and HDL of 55.  Tobacco abuse History of tobacco abuse. 3 cigarettes a day down from  one pack a day.      Runell GessJonathan J. Kaelin Bonelli MD FACP,FACC,FAHA, [facility] 02/12/2016 3:35 PM

## 2016-02-12 NOTE — Patient Instructions (Signed)
Medication Instructions:  .ISNTCUR   Labwork: NONE  Testing/Procedures: Your physician has requested that you have a lower extremity arterial doppler- During this test, ultrasound is used to evaluate arterial blood flow in the legs. Allow approximately one hour for this exam.    Follow-Up: Your physician recommends that you schedule a follow-up appointment in: 1 MONTH WITH DR BERRY.  [facility]    Any Other Special Instructions Will Be Listed Below (If Applicable).     If you need a refill on your cardiac medications before your next appointment, please call your pharmacy.

## 2016-02-12 NOTE — Assessment & Plan Note (Signed)
History of tobacco abuse. 3 cigarettes a day down from one pack a day.

## 2016-02-12 NOTE — Assessment & Plan Note (Signed)
History of coronary artery disease status post bypass grafting by Dr. Damien FusiSteve Hendricks in February 2010 with a LIMA to his LAD, left radial to the first obtuse marginal branch, vein to the ramus intermedius branch and sequential vein to acute marginal and to the first obtuse marginal branch and distal RCA. A Myoview performed 08/29/13 was nonischemic He denies chest pain or shortness of breath.

## 2016-02-12 NOTE — Assessment & Plan Note (Signed)
History of hyperlipidemia on statin therapy followed by his PCP. His last lipid profile in our chart performed 07/08/15 revealed total cholesterol 173, LDL 109 and HDL of 55.

## 2016-02-12 NOTE — Assessment & Plan Note (Signed)
History of peripheral arterial disease status post left common iliac artery stenting by myself 08/18/02 with a known occluded left SFA. I stented his right SFA in 2008 with Dopplers performed a year ago revealing a right ABI 0.4 with an occluded right SFA as well. He now has an ischemic ulcer on his right pretibial region for the last 6 months.I'm going to refer him over to the wound care center for aggressive wound care. If they are unable to promote healing he will need angiography and potential percutaneous revascularization.

## 2016-02-12 NOTE — Assessment & Plan Note (Signed)
History of carotid artery disease with known occluded left internal carotid artery and moderate right ICA stenosis by  Duplex ultrasound 07/30/15.

## 2016-02-12 NOTE — Assessment & Plan Note (Signed)
history of hypertension blood pressure measured at 200/78. He is on amlodipine and Avapro. Continued current meds at current dosing

## 2016-02-13 ENCOUNTER — Other Ambulatory Visit: Payer: Self-pay | Admitting: Cardiovascular Disease

## 2016-02-13 DIAGNOSIS — I739 Peripheral vascular disease, unspecified: Secondary | ICD-10-CM

## 2016-02-17 ENCOUNTER — Ambulatory Visit (HOSPITAL_COMMUNITY)
Admission: RE | Admit: 2016-02-17 | Discharge: 2016-02-17 | Disposition: A | Discharge status: Home / Self Care | Payer: Medicare Other | Source: Ambulatory Visit | Attending: Cardiovascular Disease | Admitting: Cardiovascular Disease

## 2016-02-17 DIAGNOSIS — I10 Essential (primary) hypertension: Secondary | ICD-10-CM

## 2016-02-17 DIAGNOSIS — E785 Hyperlipidemia, unspecified: Secondary | ICD-10-CM | POA: Insufficient documentation

## 2016-02-17 DIAGNOSIS — I739 Peripheral vascular disease, unspecified: Secondary | ICD-10-CM

## 2016-02-17 DIAGNOSIS — E1151 Type 2 diabetes mellitus with diabetic peripheral angiopathy without gangrene: Secondary | ICD-10-CM | POA: Insufficient documentation

## 2016-02-17 DIAGNOSIS — I70201 Unspecified atherosclerosis of native arteries of extremities, right leg: Secondary | ICD-10-CM | POA: Insufficient documentation

## 2016-02-17 DIAGNOSIS — I700 Atherosclerosis of aorta: Secondary | ICD-10-CM | POA: Diagnosis not present

## 2016-02-17 DIAGNOSIS — I2510 Atherosclerotic heart disease of native coronary artery without angina pectoris: Secondary | ICD-10-CM | POA: Diagnosis not present

## 2016-03-06 ENCOUNTER — Encounter (HOSPITAL_BASED_OUTPATIENT_CLINIC_OR_DEPARTMENT_OTHER): Payer: Medicare Other | Attending: Internal Medicine

## 2016-03-06 DIAGNOSIS — E11622 Type 2 diabetes mellitus with other skin ulcer: Secondary | ICD-10-CM | POA: Insufficient documentation

## 2016-03-06 DIAGNOSIS — J449 Chronic obstructive pulmonary disease, unspecified: Secondary | ICD-10-CM | POA: Insufficient documentation

## 2016-03-06 DIAGNOSIS — F17210 Nicotine dependence, cigarettes, uncomplicated: Secondary | ICD-10-CM | POA: Insufficient documentation

## 2016-03-06 DIAGNOSIS — Z9221 Personal history of antineoplastic chemotherapy: Secondary | ICD-10-CM | POA: Insufficient documentation

## 2016-03-06 DIAGNOSIS — E1151 Type 2 diabetes mellitus with diabetic peripheral angiopathy without gangrene: Secondary | ICD-10-CM | POA: Diagnosis not present

## 2016-03-06 DIAGNOSIS — L97811 Non-pressure chronic ulcer of other part of right lower leg limited to breakdown of skin: Secondary | ICD-10-CM | POA: Diagnosis not present

## 2016-03-06 DIAGNOSIS — Z95820 Peripheral vascular angioplasty status with implants and grafts: Secondary | ICD-10-CM | POA: Diagnosis not present

## 2016-03-06 DIAGNOSIS — L97821 Non-pressure chronic ulcer of other part of left lower leg limited to breakdown of skin: Secondary | ICD-10-CM | POA: Insufficient documentation

## 2016-03-06 DIAGNOSIS — I2510 Atherosclerotic heart disease of native coronary artery without angina pectoris: Secondary | ICD-10-CM | POA: Diagnosis not present

## 2016-03-06 DIAGNOSIS — Z951 Presence of aortocoronary bypass graft: Secondary | ICD-10-CM | POA: Insufficient documentation

## 2016-03-06 DIAGNOSIS — L97221 Non-pressure chronic ulcer of left calf limited to breakdown of skin: Secondary | ICD-10-CM | POA: Diagnosis not present

## 2016-03-13 DIAGNOSIS — F17210 Nicotine dependence, cigarettes, uncomplicated: Secondary | ICD-10-CM | POA: Diagnosis not present

## 2016-03-13 DIAGNOSIS — I2510 Atherosclerotic heart disease of native coronary artery without angina pectoris: Secondary | ICD-10-CM | POA: Diagnosis not present

## 2016-03-13 DIAGNOSIS — E11622 Type 2 diabetes mellitus with other skin ulcer: Secondary | ICD-10-CM | POA: Diagnosis not present

## 2016-03-13 DIAGNOSIS — L97811 Non-pressure chronic ulcer of other part of right lower leg limited to breakdown of skin: Secondary | ICD-10-CM | POA: Diagnosis not present

## 2016-03-13 DIAGNOSIS — L97221 Non-pressure chronic ulcer of left calf limited to breakdown of skin: Secondary | ICD-10-CM | POA: Diagnosis not present

## 2016-03-13 DIAGNOSIS — E1151 Type 2 diabetes mellitus with diabetic peripheral angiopathy without gangrene: Secondary | ICD-10-CM | POA: Diagnosis not present

## 2016-03-13 DIAGNOSIS — L97821 Non-pressure chronic ulcer of other part of left lower leg limited to breakdown of skin: Secondary | ICD-10-CM | POA: Diagnosis not present

## 2016-03-18 ENCOUNTER — Ambulatory Visit (INDEPENDENT_AMBULATORY_CARE_PROVIDER_SITE_OTHER): Payer: Medicare Other | Admitting: Ophthalmology

## 2016-03-18 DIAGNOSIS — I10 Essential (primary) hypertension: Secondary | ICD-10-CM

## 2016-03-18 DIAGNOSIS — E113291 Type 2 diabetes mellitus with mild nonproliferative diabetic retinopathy without macular edema, right eye: Secondary | ICD-10-CM | POA: Diagnosis not present

## 2016-03-18 DIAGNOSIS — H43813 Vitreous degeneration, bilateral: Secondary | ICD-10-CM

## 2016-03-18 DIAGNOSIS — E11319 Type 2 diabetes mellitus with unspecified diabetic retinopathy without macular edema: Secondary | ICD-10-CM | POA: Diagnosis not present

## 2016-03-18 DIAGNOSIS — E113592 Type 2 diabetes mellitus with proliferative diabetic retinopathy without macular edema, left eye: Secondary | ICD-10-CM | POA: Diagnosis not present

## 2016-03-18 DIAGNOSIS — H35033 Hypertensive retinopathy, bilateral: Secondary | ICD-10-CM | POA: Diagnosis not present

## 2016-03-18 DIAGNOSIS — H353112 Nonexudative age-related macular degeneration, right eye, intermediate dry stage: Secondary | ICD-10-CM | POA: Diagnosis not present

## 2016-03-19 DIAGNOSIS — D510 Vitamin B12 deficiency anemia due to intrinsic factor deficiency: Secondary | ICD-10-CM | POA: Diagnosis not present

## 2016-03-20 DIAGNOSIS — F17210 Nicotine dependence, cigarettes, uncomplicated: Secondary | ICD-10-CM | POA: Diagnosis not present

## 2016-03-20 DIAGNOSIS — I2510 Atherosclerotic heart disease of native coronary artery without angina pectoris: Secondary | ICD-10-CM | POA: Diagnosis not present

## 2016-03-20 DIAGNOSIS — L97221 Non-pressure chronic ulcer of left calf limited to breakdown of skin: Secondary | ICD-10-CM | POA: Diagnosis not present

## 2016-03-20 DIAGNOSIS — L97821 Non-pressure chronic ulcer of other part of left lower leg limited to breakdown of skin: Secondary | ICD-10-CM | POA: Diagnosis not present

## 2016-03-20 DIAGNOSIS — E11622 Type 2 diabetes mellitus with other skin ulcer: Secondary | ICD-10-CM | POA: Diagnosis not present

## 2016-03-20 DIAGNOSIS — L97811 Non-pressure chronic ulcer of other part of right lower leg limited to breakdown of skin: Secondary | ICD-10-CM | POA: Diagnosis not present

## 2016-03-20 DIAGNOSIS — E1151 Type 2 diabetes mellitus with diabetic peripheral angiopathy without gangrene: Secondary | ICD-10-CM | POA: Diagnosis not present

## 2016-03-27 ENCOUNTER — Encounter (HOSPITAL_BASED_OUTPATIENT_CLINIC_OR_DEPARTMENT_OTHER): Payer: Medicare Other | Attending: Internal Medicine

## 2016-03-27 DIAGNOSIS — Z9221 Personal history of antineoplastic chemotherapy: Secondary | ICD-10-CM | POA: Insufficient documentation

## 2016-03-27 DIAGNOSIS — E1151 Type 2 diabetes mellitus with diabetic peripheral angiopathy without gangrene: Secondary | ICD-10-CM | POA: Insufficient documentation

## 2016-03-27 DIAGNOSIS — J449 Chronic obstructive pulmonary disease, unspecified: Secondary | ICD-10-CM | POA: Diagnosis not present

## 2016-03-27 DIAGNOSIS — I10 Essential (primary) hypertension: Secondary | ICD-10-CM | POA: Insufficient documentation

## 2016-03-27 DIAGNOSIS — E11622 Type 2 diabetes mellitus with other skin ulcer: Secondary | ICD-10-CM | POA: Diagnosis not present

## 2016-03-27 DIAGNOSIS — F17210 Nicotine dependence, cigarettes, uncomplicated: Secondary | ICD-10-CM | POA: Insufficient documentation

## 2016-03-27 DIAGNOSIS — I2510 Atherosclerotic heart disease of native coronary artery without angina pectoris: Secondary | ICD-10-CM | POA: Insufficient documentation

## 2016-03-27 DIAGNOSIS — Z951 Presence of aortocoronary bypass graft: Secondary | ICD-10-CM | POA: Diagnosis not present

## 2016-03-27 DIAGNOSIS — L97811 Non-pressure chronic ulcer of other part of right lower leg limited to breakdown of skin: Secondary | ICD-10-CM | POA: Insufficient documentation

## 2016-04-02 DIAGNOSIS — J449 Chronic obstructive pulmonary disease, unspecified: Secondary | ICD-10-CM | POA: Diagnosis not present

## 2016-04-02 DIAGNOSIS — E7800 Pure hypercholesterolemia, unspecified: Secondary | ICD-10-CM | POA: Diagnosis not present

## 2016-04-02 DIAGNOSIS — R634 Abnormal weight loss: Secondary | ICD-10-CM | POA: Diagnosis not present

## 2016-04-02 DIAGNOSIS — Z7984 Long term (current) use of oral hypoglycemic drugs: Secondary | ICD-10-CM | POA: Diagnosis not present

## 2016-04-02 DIAGNOSIS — I739 Peripheral vascular disease, unspecified: Secondary | ICD-10-CM | POA: Diagnosis not present

## 2016-04-02 DIAGNOSIS — F17210 Nicotine dependence, cigarettes, uncomplicated: Secondary | ICD-10-CM | POA: Diagnosis not present

## 2016-04-02 DIAGNOSIS — E11319 Type 2 diabetes mellitus with unspecified diabetic retinopathy without macular edema: Secondary | ICD-10-CM | POA: Diagnosis not present

## 2016-04-02 DIAGNOSIS — E1151 Type 2 diabetes mellitus with diabetic peripheral angiopathy without gangrene: Secondary | ICD-10-CM | POA: Diagnosis not present

## 2016-04-03 DIAGNOSIS — E11622 Type 2 diabetes mellitus with other skin ulcer: Secondary | ICD-10-CM | POA: Diagnosis not present

## 2016-04-03 DIAGNOSIS — I2510 Atherosclerotic heart disease of native coronary artery without angina pectoris: Secondary | ICD-10-CM | POA: Diagnosis not present

## 2016-04-03 DIAGNOSIS — J449 Chronic obstructive pulmonary disease, unspecified: Secondary | ICD-10-CM | POA: Diagnosis not present

## 2016-04-03 DIAGNOSIS — L97811 Non-pressure chronic ulcer of other part of right lower leg limited to breakdown of skin: Secondary | ICD-10-CM | POA: Diagnosis not present

## 2016-04-03 DIAGNOSIS — F17210 Nicotine dependence, cigarettes, uncomplicated: Secondary | ICD-10-CM | POA: Diagnosis not present

## 2016-04-03 DIAGNOSIS — Z951 Presence of aortocoronary bypass graft: Secondary | ICD-10-CM | POA: Diagnosis not present

## 2016-04-08 ENCOUNTER — Encounter: Payer: Self-pay | Admitting: Cardiovascular Disease

## 2016-04-08 ENCOUNTER — Ambulatory Visit (INDEPENDENT_AMBULATORY_CARE_PROVIDER_SITE_OTHER): Payer: Medicare Other | Admitting: Cardiovascular Disease

## 2016-04-08 DIAGNOSIS — I70229 Atherosclerosis of native arteries of extremities with rest pain, unspecified extremity: Secondary | ICD-10-CM | POA: Insufficient documentation

## 2016-04-08 DIAGNOSIS — I998 Other disorder of circulatory system: Secondary | ICD-10-CM

## 2016-04-08 NOTE — Progress Notes (Signed)
Reginald Ayers returns today for follow-up of his slowly healing wound on his right calf which was present back in June when I saw him. I referred him to the wound care center which she see him on a weekly basis. His Dopplers revealed a right ABI 0.47 with occluded right SFA and tibial vessels. I do not think that there are revascularization options. I suggested he continue aggressive local care with wound care center. The wound does not heal he may require amputation. 

## 2016-04-08 NOTE — Patient Instructions (Signed)
Medication Instructions:  Your physician recommends that you continue on your current medications as directed. Please refer to the Current Medication list given to you today.  Follow-Up: Your physician wants you to follow-up in: 6 MONTHS WITH DR BERRY.   You will receive a reminder letter in the mail two months in advance. If you don't receive a letter, please call our office to schedule the follow-up appointment.   If you need a refill on your cardiac medications before your next appointment, please call your pharmacy.   

## 2016-04-08 NOTE — Assessment & Plan Note (Signed)
Reginald Ayers returns today for follow-up of his slowly healing wound on his right calf which was present back in June when I saw him. I referred him to the wound care center which she see him on a weekly basis. His Dopplers revealed a right ABI 0.47 with occluded right SFA and tibial vessels. I do not think that there are revascularization options. I suggested he continue aggressive local care with wound care center. The wound does not heal he may require amputation.

## 2016-04-10 DIAGNOSIS — Z951 Presence of aortocoronary bypass graft: Secondary | ICD-10-CM | POA: Diagnosis not present

## 2016-04-10 DIAGNOSIS — L97811 Non-pressure chronic ulcer of other part of right lower leg limited to breakdown of skin: Secondary | ICD-10-CM | POA: Diagnosis not present

## 2016-04-10 DIAGNOSIS — F17210 Nicotine dependence, cigarettes, uncomplicated: Secondary | ICD-10-CM | POA: Diagnosis not present

## 2016-04-10 DIAGNOSIS — E11622 Type 2 diabetes mellitus with other skin ulcer: Secondary | ICD-10-CM | POA: Diagnosis not present

## 2016-04-10 DIAGNOSIS — J449 Chronic obstructive pulmonary disease, unspecified: Secondary | ICD-10-CM | POA: Diagnosis not present

## 2016-04-10 DIAGNOSIS — I2510 Atherosclerotic heart disease of native coronary artery without angina pectoris: Secondary | ICD-10-CM | POA: Diagnosis not present

## 2016-04-16 DIAGNOSIS — L97811 Non-pressure chronic ulcer of other part of right lower leg limited to breakdown of skin: Secondary | ICD-10-CM | POA: Diagnosis not present

## 2016-04-16 DIAGNOSIS — E11622 Type 2 diabetes mellitus with other skin ulcer: Secondary | ICD-10-CM | POA: Diagnosis not present

## 2016-04-16 DIAGNOSIS — J449 Chronic obstructive pulmonary disease, unspecified: Secondary | ICD-10-CM | POA: Diagnosis not present

## 2016-04-16 DIAGNOSIS — I2510 Atherosclerotic heart disease of native coronary artery without angina pectoris: Secondary | ICD-10-CM | POA: Diagnosis not present

## 2016-04-16 DIAGNOSIS — Z951 Presence of aortocoronary bypass graft: Secondary | ICD-10-CM | POA: Diagnosis not present

## 2016-04-16 DIAGNOSIS — F17210 Nicotine dependence, cigarettes, uncomplicated: Secondary | ICD-10-CM | POA: Diagnosis not present

## 2016-04-20 DIAGNOSIS — D510 Vitamin B12 deficiency anemia due to intrinsic factor deficiency: Secondary | ICD-10-CM | POA: Diagnosis not present

## 2016-04-24 ENCOUNTER — Encounter (HOSPITAL_BASED_OUTPATIENT_CLINIC_OR_DEPARTMENT_OTHER): Payer: Medicare Other | Attending: Internal Medicine

## 2016-04-24 DIAGNOSIS — Z9221 Personal history of antineoplastic chemotherapy: Secondary | ICD-10-CM | POA: Insufficient documentation

## 2016-04-24 DIAGNOSIS — L97812 Non-pressure chronic ulcer of other part of right lower leg with fat layer exposed: Secondary | ICD-10-CM | POA: Diagnosis not present

## 2016-04-24 DIAGNOSIS — J449 Chronic obstructive pulmonary disease, unspecified: Secondary | ICD-10-CM | POA: Diagnosis not present

## 2016-04-24 DIAGNOSIS — E11622 Type 2 diabetes mellitus with other skin ulcer: Secondary | ICD-10-CM | POA: Diagnosis not present

## 2016-04-24 DIAGNOSIS — I10 Essential (primary) hypertension: Secondary | ICD-10-CM | POA: Insufficient documentation

## 2016-04-24 DIAGNOSIS — I2510 Atherosclerotic heart disease of native coronary artery without angina pectoris: Secondary | ICD-10-CM | POA: Diagnosis not present

## 2016-04-24 DIAGNOSIS — E1151 Type 2 diabetes mellitus with diabetic peripheral angiopathy without gangrene: Secondary | ICD-10-CM | POA: Insufficient documentation

## 2016-05-01 DIAGNOSIS — L97812 Non-pressure chronic ulcer of other part of right lower leg with fat layer exposed: Secondary | ICD-10-CM | POA: Diagnosis not present

## 2016-05-01 DIAGNOSIS — I2510 Atherosclerotic heart disease of native coronary artery without angina pectoris: Secondary | ICD-10-CM | POA: Diagnosis not present

## 2016-05-01 DIAGNOSIS — J449 Chronic obstructive pulmonary disease, unspecified: Secondary | ICD-10-CM | POA: Diagnosis not present

## 2016-05-01 DIAGNOSIS — E11622 Type 2 diabetes mellitus with other skin ulcer: Secondary | ICD-10-CM | POA: Diagnosis not present

## 2016-05-01 DIAGNOSIS — L97811 Non-pressure chronic ulcer of other part of right lower leg limited to breakdown of skin: Secondary | ICD-10-CM | POA: Diagnosis not present

## 2016-05-01 DIAGNOSIS — I10 Essential (primary) hypertension: Secondary | ICD-10-CM | POA: Diagnosis not present

## 2016-05-01 DIAGNOSIS — E1151 Type 2 diabetes mellitus with diabetic peripheral angiopathy without gangrene: Secondary | ICD-10-CM | POA: Diagnosis not present

## 2016-05-08 DIAGNOSIS — J449 Chronic obstructive pulmonary disease, unspecified: Secondary | ICD-10-CM | POA: Diagnosis not present

## 2016-05-08 DIAGNOSIS — E11622 Type 2 diabetes mellitus with other skin ulcer: Secondary | ICD-10-CM | POA: Diagnosis not present

## 2016-05-08 DIAGNOSIS — I10 Essential (primary) hypertension: Secondary | ICD-10-CM | POA: Diagnosis not present

## 2016-05-08 DIAGNOSIS — L97812 Non-pressure chronic ulcer of other part of right lower leg with fat layer exposed: Secondary | ICD-10-CM | POA: Diagnosis not present

## 2016-05-08 DIAGNOSIS — E1151 Type 2 diabetes mellitus with diabetic peripheral angiopathy without gangrene: Secondary | ICD-10-CM | POA: Diagnosis not present

## 2016-05-08 DIAGNOSIS — I2510 Atherosclerotic heart disease of native coronary artery without angina pectoris: Secondary | ICD-10-CM | POA: Diagnosis not present

## 2016-05-15 DIAGNOSIS — L97812 Non-pressure chronic ulcer of other part of right lower leg with fat layer exposed: Secondary | ICD-10-CM | POA: Diagnosis not present

## 2016-05-15 DIAGNOSIS — E11622 Type 2 diabetes mellitus with other skin ulcer: Secondary | ICD-10-CM | POA: Diagnosis not present

## 2016-05-15 DIAGNOSIS — I10 Essential (primary) hypertension: Secondary | ICD-10-CM | POA: Diagnosis not present

## 2016-05-15 DIAGNOSIS — I2510 Atherosclerotic heart disease of native coronary artery without angina pectoris: Secondary | ICD-10-CM | POA: Diagnosis not present

## 2016-05-15 DIAGNOSIS — J449 Chronic obstructive pulmonary disease, unspecified: Secondary | ICD-10-CM | POA: Diagnosis not present

## 2016-05-15 DIAGNOSIS — E1151 Type 2 diabetes mellitus with diabetic peripheral angiopathy without gangrene: Secondary | ICD-10-CM | POA: Diagnosis not present

## 2016-05-21 DIAGNOSIS — E11622 Type 2 diabetes mellitus with other skin ulcer: Secondary | ICD-10-CM | POA: Diagnosis not present

## 2016-05-21 DIAGNOSIS — I10 Essential (primary) hypertension: Secondary | ICD-10-CM | POA: Diagnosis not present

## 2016-05-21 DIAGNOSIS — L97812 Non-pressure chronic ulcer of other part of right lower leg with fat layer exposed: Secondary | ICD-10-CM | POA: Diagnosis not present

## 2016-05-21 DIAGNOSIS — E1151 Type 2 diabetes mellitus with diabetic peripheral angiopathy without gangrene: Secondary | ICD-10-CM | POA: Diagnosis not present

## 2016-05-21 DIAGNOSIS — I2510 Atherosclerotic heart disease of native coronary artery without angina pectoris: Secondary | ICD-10-CM | POA: Diagnosis not present

## 2016-05-21 DIAGNOSIS — J449 Chronic obstructive pulmonary disease, unspecified: Secondary | ICD-10-CM | POA: Diagnosis not present

## 2016-05-21 DIAGNOSIS — L97811 Non-pressure chronic ulcer of other part of right lower leg limited to breakdown of skin: Secondary | ICD-10-CM | POA: Diagnosis not present

## 2016-05-22 DIAGNOSIS — D510 Vitamin B12 deficiency anemia due to intrinsic factor deficiency: Secondary | ICD-10-CM | POA: Diagnosis not present

## 2016-05-28 ENCOUNTER — Encounter (HOSPITAL_BASED_OUTPATIENT_CLINIC_OR_DEPARTMENT_OTHER): Payer: Medicare Other | Attending: Internal Medicine

## 2016-05-28 DIAGNOSIS — Z9221 Personal history of antineoplastic chemotherapy: Secondary | ICD-10-CM | POA: Diagnosis not present

## 2016-05-28 DIAGNOSIS — I2510 Atherosclerotic heart disease of native coronary artery without angina pectoris: Secondary | ICD-10-CM | POA: Diagnosis not present

## 2016-05-28 DIAGNOSIS — Z951 Presence of aortocoronary bypass graft: Secondary | ICD-10-CM | POA: Diagnosis not present

## 2016-05-28 DIAGNOSIS — I10 Essential (primary) hypertension: Secondary | ICD-10-CM | POA: Insufficient documentation

## 2016-05-28 DIAGNOSIS — L97812 Non-pressure chronic ulcer of other part of right lower leg with fat layer exposed: Secondary | ICD-10-CM | POA: Diagnosis not present

## 2016-05-28 DIAGNOSIS — J449 Chronic obstructive pulmonary disease, unspecified: Secondary | ICD-10-CM | POA: Insufficient documentation

## 2016-05-28 DIAGNOSIS — E1151 Type 2 diabetes mellitus with diabetic peripheral angiopathy without gangrene: Secondary | ICD-10-CM | POA: Diagnosis not present

## 2016-05-28 DIAGNOSIS — E11622 Type 2 diabetes mellitus with other skin ulcer: Secondary | ICD-10-CM | POA: Diagnosis not present

## 2016-05-28 DIAGNOSIS — F17210 Nicotine dependence, cigarettes, uncomplicated: Secondary | ICD-10-CM | POA: Insufficient documentation

## 2016-06-04 DIAGNOSIS — I70238 Atherosclerosis of native arteries of right leg with ulceration of other part of lower right leg: Secondary | ICD-10-CM | POA: Diagnosis not present

## 2016-06-04 DIAGNOSIS — I2510 Atherosclerotic heart disease of native coronary artery without angina pectoris: Secondary | ICD-10-CM | POA: Diagnosis not present

## 2016-06-04 DIAGNOSIS — L97812 Non-pressure chronic ulcer of other part of right lower leg with fat layer exposed: Secondary | ICD-10-CM | POA: Diagnosis not present

## 2016-06-04 DIAGNOSIS — J449 Chronic obstructive pulmonary disease, unspecified: Secondary | ICD-10-CM | POA: Diagnosis not present

## 2016-06-04 DIAGNOSIS — I10 Essential (primary) hypertension: Secondary | ICD-10-CM | POA: Diagnosis not present

## 2016-06-04 DIAGNOSIS — E11622 Type 2 diabetes mellitus with other skin ulcer: Secondary | ICD-10-CM | POA: Diagnosis not present

## 2016-06-04 DIAGNOSIS — Z9221 Personal history of antineoplastic chemotherapy: Secondary | ICD-10-CM | POA: Diagnosis not present

## 2016-06-11 DIAGNOSIS — L97212 Non-pressure chronic ulcer of right calf with fat layer exposed: Secondary | ICD-10-CM | POA: Diagnosis not present

## 2016-06-11 DIAGNOSIS — E11628 Type 2 diabetes mellitus with other skin complications: Secondary | ICD-10-CM | POA: Diagnosis not present

## 2016-06-11 DIAGNOSIS — I70238 Atherosclerosis of native arteries of right leg with ulceration of other part of lower right leg: Secondary | ICD-10-CM | POA: Diagnosis not present

## 2016-06-11 DIAGNOSIS — E11622 Type 2 diabetes mellitus with other skin ulcer: Secondary | ICD-10-CM | POA: Diagnosis not present

## 2016-06-11 DIAGNOSIS — J449 Chronic obstructive pulmonary disease, unspecified: Secondary | ICD-10-CM | POA: Diagnosis not present

## 2016-06-11 DIAGNOSIS — I2510 Atherosclerotic heart disease of native coronary artery without angina pectoris: Secondary | ICD-10-CM | POA: Diagnosis not present

## 2016-06-11 DIAGNOSIS — L97812 Non-pressure chronic ulcer of other part of right lower leg with fat layer exposed: Secondary | ICD-10-CM | POA: Diagnosis not present

## 2016-06-11 DIAGNOSIS — I10 Essential (primary) hypertension: Secondary | ICD-10-CM | POA: Diagnosis not present

## 2016-06-11 DIAGNOSIS — Z9221 Personal history of antineoplastic chemotherapy: Secondary | ICD-10-CM | POA: Diagnosis not present

## 2016-06-18 DIAGNOSIS — Z9221 Personal history of antineoplastic chemotherapy: Secondary | ICD-10-CM | POA: Diagnosis not present

## 2016-06-18 DIAGNOSIS — I10 Essential (primary) hypertension: Secondary | ICD-10-CM | POA: Diagnosis not present

## 2016-06-18 DIAGNOSIS — E11622 Type 2 diabetes mellitus with other skin ulcer: Secondary | ICD-10-CM | POA: Diagnosis not present

## 2016-06-18 DIAGNOSIS — J449 Chronic obstructive pulmonary disease, unspecified: Secondary | ICD-10-CM | POA: Diagnosis not present

## 2016-06-18 DIAGNOSIS — L97812 Non-pressure chronic ulcer of other part of right lower leg with fat layer exposed: Secondary | ICD-10-CM | POA: Diagnosis not present

## 2016-06-18 DIAGNOSIS — E1151 Type 2 diabetes mellitus with diabetic peripheral angiopathy without gangrene: Secondary | ICD-10-CM | POA: Diagnosis not present

## 2016-06-18 DIAGNOSIS — I2510 Atherosclerotic heart disease of native coronary artery without angina pectoris: Secondary | ICD-10-CM | POA: Diagnosis not present

## 2016-06-22 DIAGNOSIS — D510 Vitamin B12 deficiency anemia due to intrinsic factor deficiency: Secondary | ICD-10-CM | POA: Diagnosis not present

## 2016-06-22 DIAGNOSIS — Z23 Encounter for immunization: Secondary | ICD-10-CM | POA: Diagnosis not present

## 2016-06-25 ENCOUNTER — Encounter (HOSPITAL_BASED_OUTPATIENT_CLINIC_OR_DEPARTMENT_OTHER): Payer: Medicare Other

## 2016-06-25 ENCOUNTER — Encounter (HOSPITAL_BASED_OUTPATIENT_CLINIC_OR_DEPARTMENT_OTHER): Payer: Medicare Other | Attending: Internal Medicine

## 2016-06-25 DIAGNOSIS — L97213 Non-pressure chronic ulcer of right calf with necrosis of muscle: Secondary | ICD-10-CM | POA: Insufficient documentation

## 2016-06-25 DIAGNOSIS — I2510 Atherosclerotic heart disease of native coronary artery without angina pectoris: Secondary | ICD-10-CM | POA: Diagnosis not present

## 2016-06-25 DIAGNOSIS — I739 Peripheral vascular disease, unspecified: Secondary | ICD-10-CM | POA: Insufficient documentation

## 2016-06-25 DIAGNOSIS — J449 Chronic obstructive pulmonary disease, unspecified: Secondary | ICD-10-CM | POA: Insufficient documentation

## 2016-06-25 DIAGNOSIS — I10 Essential (primary) hypertension: Secondary | ICD-10-CM | POA: Diagnosis not present

## 2016-06-25 DIAGNOSIS — L97221 Non-pressure chronic ulcer of left calf limited to breakdown of skin: Secondary | ICD-10-CM | POA: Insufficient documentation

## 2016-06-25 DIAGNOSIS — E11622 Type 2 diabetes mellitus with other skin ulcer: Secondary | ICD-10-CM | POA: Diagnosis not present

## 2016-06-25 DIAGNOSIS — L97812 Non-pressure chronic ulcer of other part of right lower leg with fat layer exposed: Secondary | ICD-10-CM | POA: Diagnosis not present

## 2016-06-25 DIAGNOSIS — E1151 Type 2 diabetes mellitus with diabetic peripheral angiopathy without gangrene: Secondary | ICD-10-CM | POA: Insufficient documentation

## 2016-06-25 DIAGNOSIS — Z9221 Personal history of antineoplastic chemotherapy: Secondary | ICD-10-CM | POA: Diagnosis not present

## 2016-07-02 DIAGNOSIS — J449 Chronic obstructive pulmonary disease, unspecified: Secondary | ICD-10-CM | POA: Diagnosis not present

## 2016-07-02 DIAGNOSIS — L97213 Non-pressure chronic ulcer of right calf with necrosis of muscle: Secondary | ICD-10-CM | POA: Diagnosis not present

## 2016-07-02 DIAGNOSIS — L97212 Non-pressure chronic ulcer of right calf with fat layer exposed: Secondary | ICD-10-CM | POA: Diagnosis not present

## 2016-07-02 DIAGNOSIS — I10 Essential (primary) hypertension: Secondary | ICD-10-CM | POA: Diagnosis not present

## 2016-07-02 DIAGNOSIS — E11622 Type 2 diabetes mellitus with other skin ulcer: Secondary | ICD-10-CM | POA: Diagnosis not present

## 2016-07-02 DIAGNOSIS — L97221 Non-pressure chronic ulcer of left calf limited to breakdown of skin: Secondary | ICD-10-CM | POA: Diagnosis not present

## 2016-07-02 DIAGNOSIS — E1151 Type 2 diabetes mellitus with diabetic peripheral angiopathy without gangrene: Secondary | ICD-10-CM | POA: Diagnosis not present

## 2016-07-09 DIAGNOSIS — J449 Chronic obstructive pulmonary disease, unspecified: Secondary | ICD-10-CM | POA: Diagnosis not present

## 2016-07-09 DIAGNOSIS — E1151 Type 2 diabetes mellitus with diabetic peripheral angiopathy without gangrene: Secondary | ICD-10-CM | POA: Diagnosis not present

## 2016-07-09 DIAGNOSIS — I10 Essential (primary) hypertension: Secondary | ICD-10-CM | POA: Diagnosis not present

## 2016-07-09 DIAGNOSIS — E11622 Type 2 diabetes mellitus with other skin ulcer: Secondary | ICD-10-CM | POA: Diagnosis not present

## 2016-07-09 DIAGNOSIS — L97213 Non-pressure chronic ulcer of right calf with necrosis of muscle: Secondary | ICD-10-CM | POA: Diagnosis not present

## 2016-07-09 DIAGNOSIS — L97212 Non-pressure chronic ulcer of right calf with fat layer exposed: Secondary | ICD-10-CM | POA: Diagnosis not present

## 2016-07-09 DIAGNOSIS — L97221 Non-pressure chronic ulcer of left calf limited to breakdown of skin: Secondary | ICD-10-CM | POA: Diagnosis not present

## 2016-07-15 DIAGNOSIS — I10 Essential (primary) hypertension: Secondary | ICD-10-CM | POA: Diagnosis not present

## 2016-07-15 DIAGNOSIS — E11622 Type 2 diabetes mellitus with other skin ulcer: Secondary | ICD-10-CM | POA: Diagnosis not present

## 2016-07-15 DIAGNOSIS — J449 Chronic obstructive pulmonary disease, unspecified: Secondary | ICD-10-CM | POA: Diagnosis not present

## 2016-07-15 DIAGNOSIS — L97213 Non-pressure chronic ulcer of right calf with necrosis of muscle: Secondary | ICD-10-CM | POA: Diagnosis not present

## 2016-07-15 DIAGNOSIS — L97221 Non-pressure chronic ulcer of left calf limited to breakdown of skin: Secondary | ICD-10-CM | POA: Diagnosis not present

## 2016-07-15 DIAGNOSIS — E1151 Type 2 diabetes mellitus with diabetic peripheral angiopathy without gangrene: Secondary | ICD-10-CM | POA: Diagnosis not present

## 2016-07-19 DIAGNOSIS — E1151 Type 2 diabetes mellitus with diabetic peripheral angiopathy without gangrene: Secondary | ICD-10-CM | POA: Diagnosis not present

## 2016-07-19 DIAGNOSIS — Z7984 Long term (current) use of oral hypoglycemic drugs: Secondary | ICD-10-CM | POA: Diagnosis not present

## 2016-07-19 DIAGNOSIS — Z4800 Encounter for change or removal of nonsurgical wound dressing: Secondary | ICD-10-CM | POA: Diagnosis not present

## 2016-07-19 DIAGNOSIS — L97812 Non-pressure chronic ulcer of other part of right lower leg with fat layer exposed: Secondary | ICD-10-CM | POA: Diagnosis not present

## 2016-07-19 DIAGNOSIS — Z7902 Long term (current) use of antithrombotics/antiplatelets: Secondary | ICD-10-CM | POA: Diagnosis not present

## 2016-07-19 DIAGNOSIS — E11622 Type 2 diabetes mellitus with other skin ulcer: Secondary | ICD-10-CM | POA: Diagnosis not present

## 2016-07-19 DIAGNOSIS — Z7982 Long term (current) use of aspirin: Secondary | ICD-10-CM | POA: Diagnosis not present

## 2016-07-19 DIAGNOSIS — I743 Embolism and thrombosis of arteries of the lower extremities: Secondary | ICD-10-CM | POA: Diagnosis not present

## 2016-07-19 DIAGNOSIS — L97312 Non-pressure chronic ulcer of right ankle with fat layer exposed: Secondary | ICD-10-CM | POA: Diagnosis not present

## 2016-07-21 DIAGNOSIS — E1151 Type 2 diabetes mellitus with diabetic peripheral angiopathy without gangrene: Secondary | ICD-10-CM | POA: Diagnosis not present

## 2016-07-21 DIAGNOSIS — L97812 Non-pressure chronic ulcer of other part of right lower leg with fat layer exposed: Secondary | ICD-10-CM | POA: Diagnosis not present

## 2016-07-21 DIAGNOSIS — L97312 Non-pressure chronic ulcer of right ankle with fat layer exposed: Secondary | ICD-10-CM | POA: Diagnosis not present

## 2016-07-21 DIAGNOSIS — Z4800 Encounter for change or removal of nonsurgical wound dressing: Secondary | ICD-10-CM | POA: Diagnosis not present

## 2016-07-21 DIAGNOSIS — I743 Embolism and thrombosis of arteries of the lower extremities: Secondary | ICD-10-CM | POA: Diagnosis not present

## 2016-07-21 DIAGNOSIS — E11622 Type 2 diabetes mellitus with other skin ulcer: Secondary | ICD-10-CM | POA: Diagnosis not present

## 2016-07-23 DIAGNOSIS — L97221 Non-pressure chronic ulcer of left calf limited to breakdown of skin: Secondary | ICD-10-CM | POA: Diagnosis not present

## 2016-07-23 DIAGNOSIS — L97213 Non-pressure chronic ulcer of right calf with necrosis of muscle: Secondary | ICD-10-CM | POA: Diagnosis not present

## 2016-07-23 DIAGNOSIS — E11622 Type 2 diabetes mellitus with other skin ulcer: Secondary | ICD-10-CM | POA: Diagnosis not present

## 2016-07-23 DIAGNOSIS — E1151 Type 2 diabetes mellitus with diabetic peripheral angiopathy without gangrene: Secondary | ICD-10-CM | POA: Diagnosis not present

## 2016-07-23 DIAGNOSIS — L97312 Non-pressure chronic ulcer of right ankle with fat layer exposed: Secondary | ICD-10-CM | POA: Diagnosis not present

## 2016-07-23 DIAGNOSIS — I10 Essential (primary) hypertension: Secondary | ICD-10-CM | POA: Diagnosis not present

## 2016-07-23 DIAGNOSIS — J449 Chronic obstructive pulmonary disease, unspecified: Secondary | ICD-10-CM | POA: Diagnosis not present

## 2016-07-23 DIAGNOSIS — L97812 Non-pressure chronic ulcer of other part of right lower leg with fat layer exposed: Secondary | ICD-10-CM | POA: Diagnosis not present

## 2016-07-24 DIAGNOSIS — D510 Vitamin B12 deficiency anemia due to intrinsic factor deficiency: Secondary | ICD-10-CM | POA: Diagnosis not present

## 2016-07-25 DIAGNOSIS — E1151 Type 2 diabetes mellitus with diabetic peripheral angiopathy without gangrene: Secondary | ICD-10-CM | POA: Diagnosis not present

## 2016-07-25 DIAGNOSIS — L97812 Non-pressure chronic ulcer of other part of right lower leg with fat layer exposed: Secondary | ICD-10-CM | POA: Diagnosis not present

## 2016-07-25 DIAGNOSIS — Z4800 Encounter for change or removal of nonsurgical wound dressing: Secondary | ICD-10-CM | POA: Diagnosis not present

## 2016-07-25 DIAGNOSIS — E11622 Type 2 diabetes mellitus with other skin ulcer: Secondary | ICD-10-CM | POA: Diagnosis not present

## 2016-07-25 DIAGNOSIS — L97312 Non-pressure chronic ulcer of right ankle with fat layer exposed: Secondary | ICD-10-CM | POA: Diagnosis not present

## 2016-07-25 DIAGNOSIS — I743 Embolism and thrombosis of arteries of the lower extremities: Secondary | ICD-10-CM | POA: Diagnosis not present

## 2016-07-27 DIAGNOSIS — E1151 Type 2 diabetes mellitus with diabetic peripheral angiopathy without gangrene: Secondary | ICD-10-CM | POA: Diagnosis not present

## 2016-07-27 DIAGNOSIS — Z4800 Encounter for change or removal of nonsurgical wound dressing: Secondary | ICD-10-CM | POA: Diagnosis not present

## 2016-07-27 DIAGNOSIS — L97812 Non-pressure chronic ulcer of other part of right lower leg with fat layer exposed: Secondary | ICD-10-CM | POA: Diagnosis not present

## 2016-07-27 DIAGNOSIS — L97312 Non-pressure chronic ulcer of right ankle with fat layer exposed: Secondary | ICD-10-CM | POA: Diagnosis not present

## 2016-07-27 DIAGNOSIS — E11622 Type 2 diabetes mellitus with other skin ulcer: Secondary | ICD-10-CM | POA: Diagnosis not present

## 2016-07-27 DIAGNOSIS — I743 Embolism and thrombosis of arteries of the lower extremities: Secondary | ICD-10-CM | POA: Diagnosis not present

## 2016-07-30 ENCOUNTER — Encounter (HOSPITAL_BASED_OUTPATIENT_CLINIC_OR_DEPARTMENT_OTHER): Payer: Medicare Other | Attending: Internal Medicine

## 2016-07-30 DIAGNOSIS — S90821A Blister (nonthermal), right foot, initial encounter: Secondary | ICD-10-CM | POA: Diagnosis not present

## 2016-07-30 DIAGNOSIS — E1151 Type 2 diabetes mellitus with diabetic peripheral angiopathy without gangrene: Secondary | ICD-10-CM | POA: Insufficient documentation

## 2016-07-30 DIAGNOSIS — X58XXXA Exposure to other specified factors, initial encounter: Secondary | ICD-10-CM | POA: Diagnosis not present

## 2016-07-30 DIAGNOSIS — I10 Essential (primary) hypertension: Secondary | ICD-10-CM | POA: Diagnosis not present

## 2016-07-30 DIAGNOSIS — E11622 Type 2 diabetes mellitus with other skin ulcer: Secondary | ICD-10-CM | POA: Diagnosis not present

## 2016-07-30 DIAGNOSIS — L97812 Non-pressure chronic ulcer of other part of right lower leg with fat layer exposed: Secondary | ICD-10-CM | POA: Insufficient documentation

## 2016-07-30 DIAGNOSIS — J449 Chronic obstructive pulmonary disease, unspecified: Secondary | ICD-10-CM | POA: Insufficient documentation

## 2016-07-30 DIAGNOSIS — Z9221 Personal history of antineoplastic chemotherapy: Secondary | ICD-10-CM | POA: Insufficient documentation

## 2016-07-30 DIAGNOSIS — S91301A Unspecified open wound, right foot, initial encounter: Secondary | ICD-10-CM | POA: Diagnosis not present

## 2016-08-01 DIAGNOSIS — L97812 Non-pressure chronic ulcer of other part of right lower leg with fat layer exposed: Secondary | ICD-10-CM | POA: Diagnosis not present

## 2016-08-01 DIAGNOSIS — E11622 Type 2 diabetes mellitus with other skin ulcer: Secondary | ICD-10-CM | POA: Diagnosis not present

## 2016-08-01 DIAGNOSIS — E1151 Type 2 diabetes mellitus with diabetic peripheral angiopathy without gangrene: Secondary | ICD-10-CM | POA: Diagnosis not present

## 2016-08-01 DIAGNOSIS — Z4800 Encounter for change or removal of nonsurgical wound dressing: Secondary | ICD-10-CM | POA: Diagnosis not present

## 2016-08-01 DIAGNOSIS — I743 Embolism and thrombosis of arteries of the lower extremities: Secondary | ICD-10-CM | POA: Diagnosis not present

## 2016-08-01 DIAGNOSIS — L97312 Non-pressure chronic ulcer of right ankle with fat layer exposed: Secondary | ICD-10-CM | POA: Diagnosis not present

## 2016-08-03 DIAGNOSIS — E1151 Type 2 diabetes mellitus with diabetic peripheral angiopathy without gangrene: Secondary | ICD-10-CM | POA: Diagnosis not present

## 2016-08-03 DIAGNOSIS — Z4800 Encounter for change or removal of nonsurgical wound dressing: Secondary | ICD-10-CM | POA: Diagnosis not present

## 2016-08-03 DIAGNOSIS — L97812 Non-pressure chronic ulcer of other part of right lower leg with fat layer exposed: Secondary | ICD-10-CM | POA: Diagnosis not present

## 2016-08-03 DIAGNOSIS — E11622 Type 2 diabetes mellitus with other skin ulcer: Secondary | ICD-10-CM | POA: Diagnosis not present

## 2016-08-03 DIAGNOSIS — L97312 Non-pressure chronic ulcer of right ankle with fat layer exposed: Secondary | ICD-10-CM | POA: Diagnosis not present

## 2016-08-03 DIAGNOSIS — I743 Embolism and thrombosis of arteries of the lower extremities: Secondary | ICD-10-CM | POA: Diagnosis not present

## 2016-08-05 DIAGNOSIS — E1151 Type 2 diabetes mellitus with diabetic peripheral angiopathy without gangrene: Secondary | ICD-10-CM | POA: Diagnosis not present

## 2016-08-05 DIAGNOSIS — L97312 Non-pressure chronic ulcer of right ankle with fat layer exposed: Secondary | ICD-10-CM | POA: Diagnosis not present

## 2016-08-05 DIAGNOSIS — L97812 Non-pressure chronic ulcer of other part of right lower leg with fat layer exposed: Secondary | ICD-10-CM | POA: Diagnosis not present

## 2016-08-05 DIAGNOSIS — Z4800 Encounter for change or removal of nonsurgical wound dressing: Secondary | ICD-10-CM | POA: Diagnosis not present

## 2016-08-05 DIAGNOSIS — I743 Embolism and thrombosis of arteries of the lower extremities: Secondary | ICD-10-CM | POA: Diagnosis not present

## 2016-08-05 DIAGNOSIS — E11622 Type 2 diabetes mellitus with other skin ulcer: Secondary | ICD-10-CM | POA: Diagnosis not present

## 2016-08-06 DIAGNOSIS — S91301A Unspecified open wound, right foot, initial encounter: Secondary | ICD-10-CM | POA: Diagnosis not present

## 2016-08-06 DIAGNOSIS — J449 Chronic obstructive pulmonary disease, unspecified: Secondary | ICD-10-CM | POA: Diagnosis not present

## 2016-08-06 DIAGNOSIS — S81801A Unspecified open wound, right lower leg, initial encounter: Secondary | ICD-10-CM | POA: Diagnosis not present

## 2016-08-06 DIAGNOSIS — L97812 Non-pressure chronic ulcer of other part of right lower leg with fat layer exposed: Secondary | ICD-10-CM | POA: Diagnosis not present

## 2016-08-06 DIAGNOSIS — E1151 Type 2 diabetes mellitus with diabetic peripheral angiopathy without gangrene: Secondary | ICD-10-CM | POA: Diagnosis not present

## 2016-08-06 DIAGNOSIS — E11622 Type 2 diabetes mellitus with other skin ulcer: Secondary | ICD-10-CM | POA: Diagnosis not present

## 2016-08-06 DIAGNOSIS — L97213 Non-pressure chronic ulcer of right calf with necrosis of muscle: Secondary | ICD-10-CM | POA: Diagnosis not present

## 2016-08-06 DIAGNOSIS — S90821A Blister (nonthermal), right foot, initial encounter: Secondary | ICD-10-CM | POA: Diagnosis not present

## 2016-08-06 DIAGNOSIS — I10 Essential (primary) hypertension: Secondary | ICD-10-CM | POA: Diagnosis not present

## 2016-08-07 ENCOUNTER — Other Ambulatory Visit: Payer: Self-pay | Admitting: Internal Medicine

## 2016-08-07 DIAGNOSIS — E7800 Pure hypercholesterolemia, unspecified: Secondary | ICD-10-CM | POA: Diagnosis not present

## 2016-08-07 DIAGNOSIS — E11319 Type 2 diabetes mellitus with unspecified diabetic retinopathy without macular edema: Secondary | ICD-10-CM | POA: Diagnosis not present

## 2016-08-07 DIAGNOSIS — F17208 Nicotine dependence, unspecified, with other nicotine-induced disorders: Secondary | ICD-10-CM

## 2016-08-07 DIAGNOSIS — Z7984 Long term (current) use of oral hypoglycemic drugs: Secondary | ICD-10-CM | POA: Diagnosis not present

## 2016-08-07 DIAGNOSIS — F17210 Nicotine dependence, cigarettes, uncomplicated: Secondary | ICD-10-CM | POA: Diagnosis not present

## 2016-08-07 DIAGNOSIS — I10 Essential (primary) hypertension: Secondary | ICD-10-CM | POA: Diagnosis not present

## 2016-08-07 DIAGNOSIS — J449 Chronic obstructive pulmonary disease, unspecified: Secondary | ICD-10-CM | POA: Diagnosis not present

## 2016-08-07 DIAGNOSIS — E1151 Type 2 diabetes mellitus with diabetic peripheral angiopathy without gangrene: Secondary | ICD-10-CM | POA: Diagnosis not present

## 2016-08-07 DIAGNOSIS — I739 Peripheral vascular disease, unspecified: Secondary | ICD-10-CM | POA: Diagnosis not present

## 2016-08-11 DIAGNOSIS — L97812 Non-pressure chronic ulcer of other part of right lower leg with fat layer exposed: Secondary | ICD-10-CM | POA: Diagnosis not present

## 2016-08-11 DIAGNOSIS — L97312 Non-pressure chronic ulcer of right ankle with fat layer exposed: Secondary | ICD-10-CM | POA: Diagnosis not present

## 2016-08-11 DIAGNOSIS — E1151 Type 2 diabetes mellitus with diabetic peripheral angiopathy without gangrene: Secondary | ICD-10-CM | POA: Diagnosis not present

## 2016-08-11 DIAGNOSIS — Z4800 Encounter for change or removal of nonsurgical wound dressing: Secondary | ICD-10-CM | POA: Diagnosis not present

## 2016-08-11 DIAGNOSIS — E11622 Type 2 diabetes mellitus with other skin ulcer: Secondary | ICD-10-CM | POA: Diagnosis not present

## 2016-08-11 DIAGNOSIS — I743 Embolism and thrombosis of arteries of the lower extremities: Secondary | ICD-10-CM | POA: Diagnosis not present

## 2016-08-13 DIAGNOSIS — I743 Embolism and thrombosis of arteries of the lower extremities: Secondary | ICD-10-CM | POA: Diagnosis not present

## 2016-08-13 DIAGNOSIS — Z4800 Encounter for change or removal of nonsurgical wound dressing: Secondary | ICD-10-CM | POA: Diagnosis not present

## 2016-08-13 DIAGNOSIS — L97812 Non-pressure chronic ulcer of other part of right lower leg with fat layer exposed: Secondary | ICD-10-CM | POA: Diagnosis not present

## 2016-08-13 DIAGNOSIS — L97312 Non-pressure chronic ulcer of right ankle with fat layer exposed: Secondary | ICD-10-CM | POA: Diagnosis not present

## 2016-08-13 DIAGNOSIS — E11622 Type 2 diabetes mellitus with other skin ulcer: Secondary | ICD-10-CM | POA: Diagnosis not present

## 2016-08-13 DIAGNOSIS — E1151 Type 2 diabetes mellitus with diabetic peripheral angiopathy without gangrene: Secondary | ICD-10-CM | POA: Diagnosis not present

## 2016-08-14 ENCOUNTER — Ambulatory Visit
Admission: RE | Admit: 2016-08-14 | Discharge: 2016-08-14 | Disposition: A | Discharge status: Home / Self Care | Payer: Medicare Other | Source: Ambulatory Visit | Attending: Internal Medicine | Admitting: Internal Medicine

## 2016-08-14 DIAGNOSIS — F17208 Nicotine dependence, unspecified, with other nicotine-induced disorders: Secondary | ICD-10-CM

## 2016-08-14 DIAGNOSIS — Z87891 Personal history of nicotine dependence: Secondary | ICD-10-CM | POA: Diagnosis not present

## 2016-08-14 IMAGING — CT CT CHEST LUNG CANCER SCREENING LOW DOSE W/O CM
2 of 5 series · 15 of 40 positions shown, 18 images · non-contrast
Comparison: None.

CLINICAL DATA: Low dose Lung cancer screening. Current asymptomatic
smoker. Fifty pack-year history.

EXAM:
CT CHEST WITHOUT CONTRAST LOW-DOSE FOR LUNG CANCER SCREENING
TECHNIQUE: Multidetector CT imaging of the chest was performed following the
standard protocol without IV contrast.

[Series 4: cor · coronal · 0.57mm/px · 3 of 237 slices shown]
[im 48/237  lung]
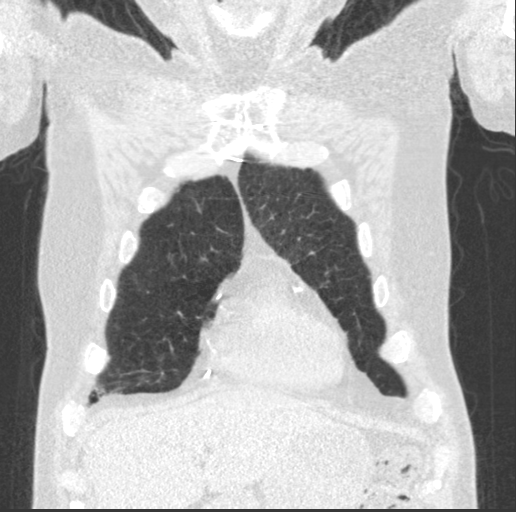
[im 95/237  lung]
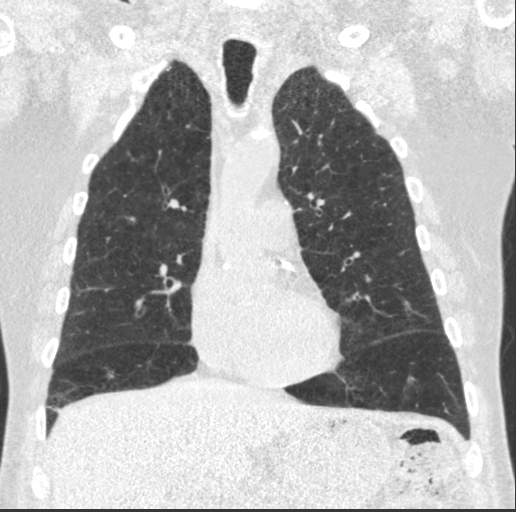
[im 142/237  lung]
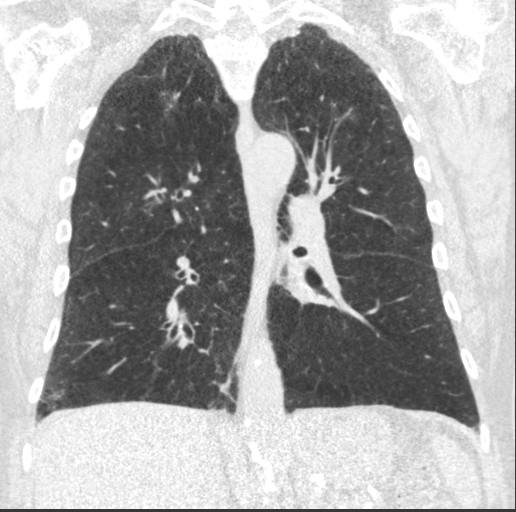

[Series 6: super d · axial · 0.56mm/px · z∈[+994,+1234]mm · 12 of 265 slices shown, 15 images]
[im 13/265  mediastinal]
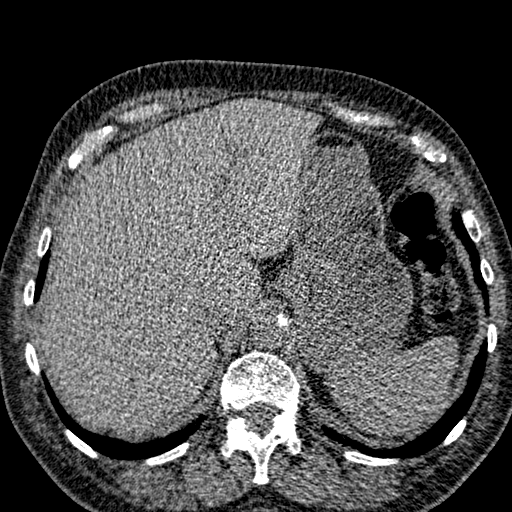
[im 13/265  lung]
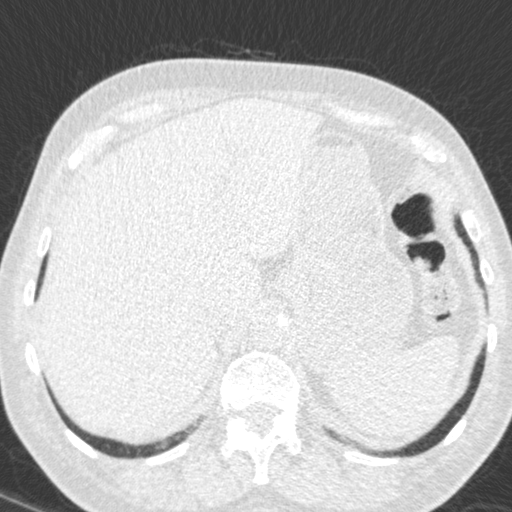
[im 37/265  lung]
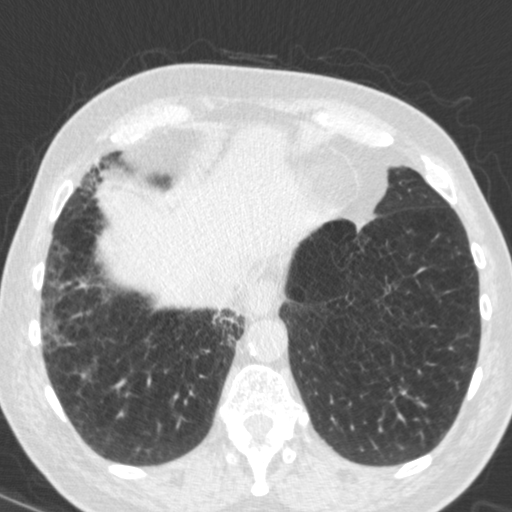
[im 61/265  lung]
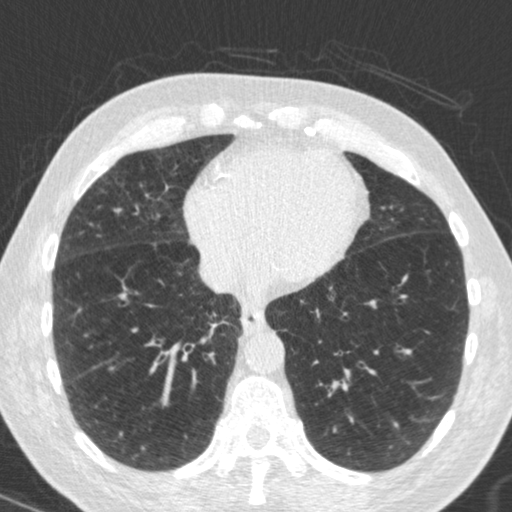
[im 85/265  lung]
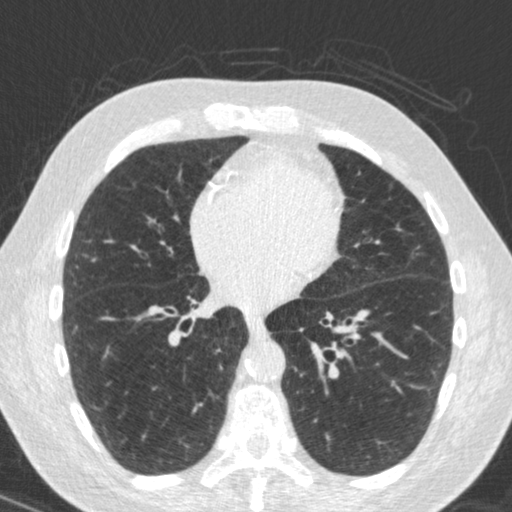
[im 97/265  mediastinal]
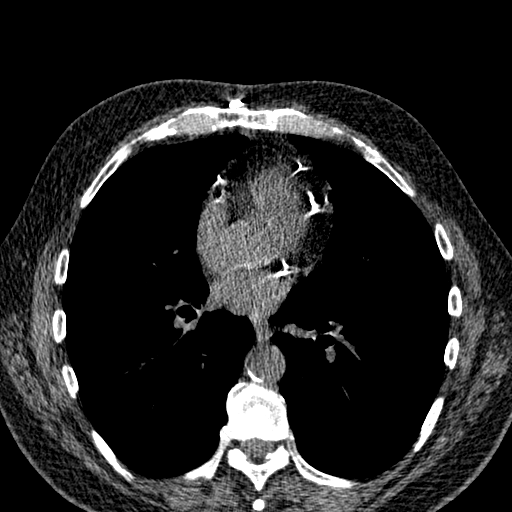
[im 97/265  lung]
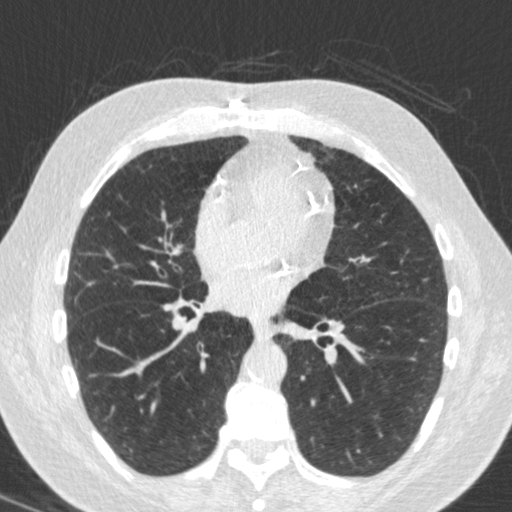
[im 121/265  lung]
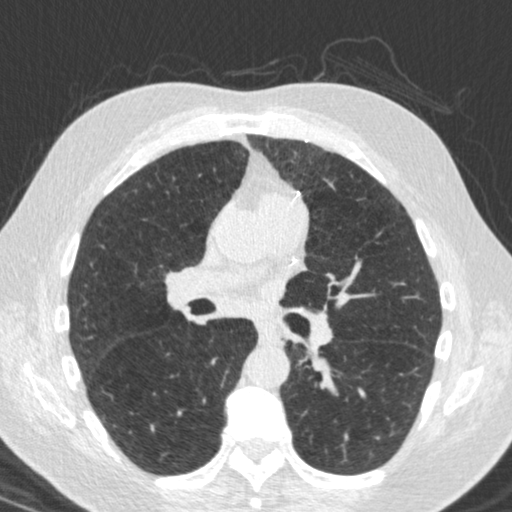
[im 145/265  lung]
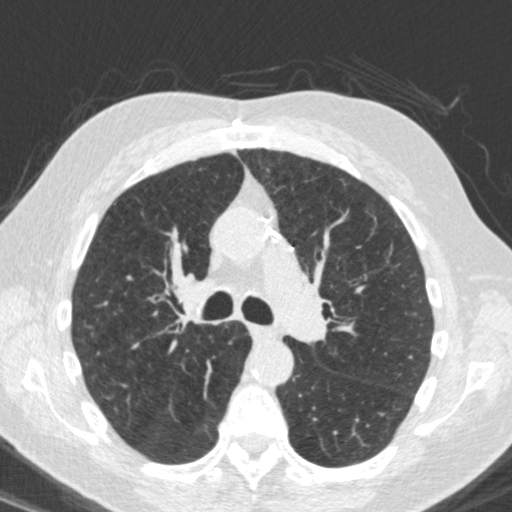
[im 169/265  lung]
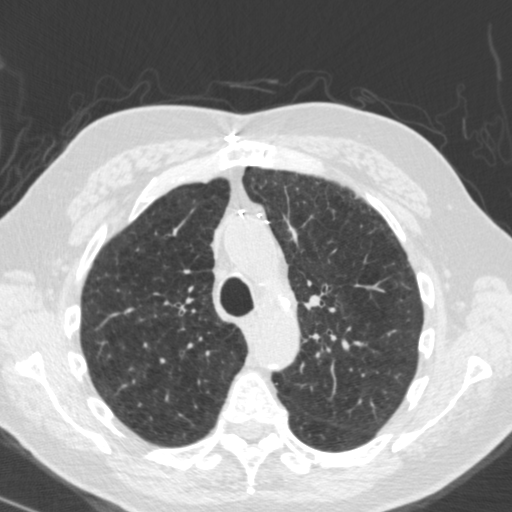
[im 181/265  mediastinal]
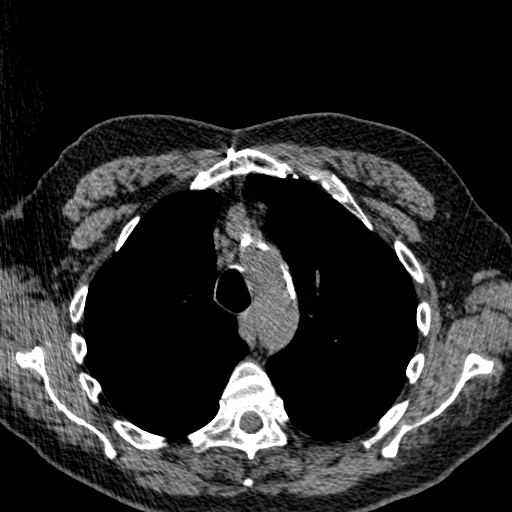
[im 181/265  lung]
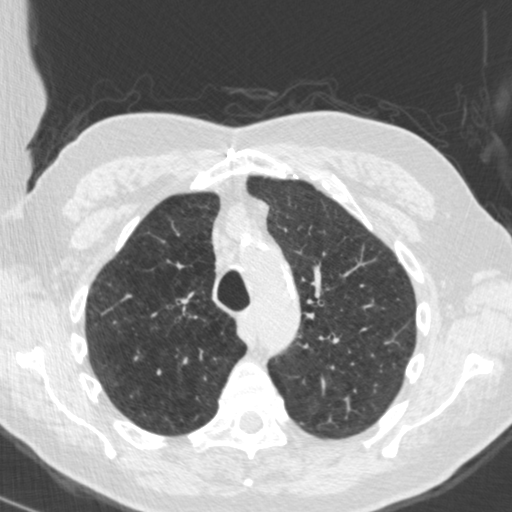
[im 205/265  lung]
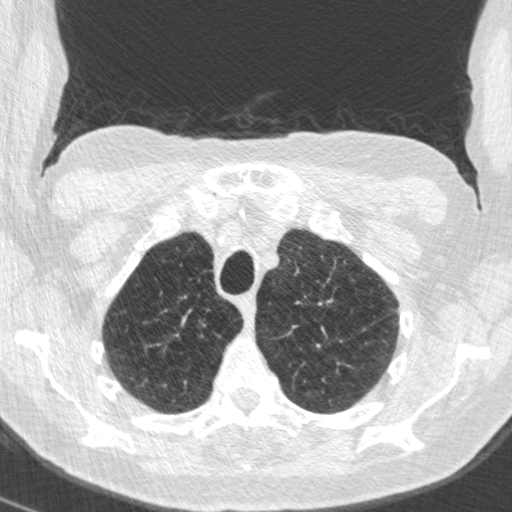
[im 229/265  lung]
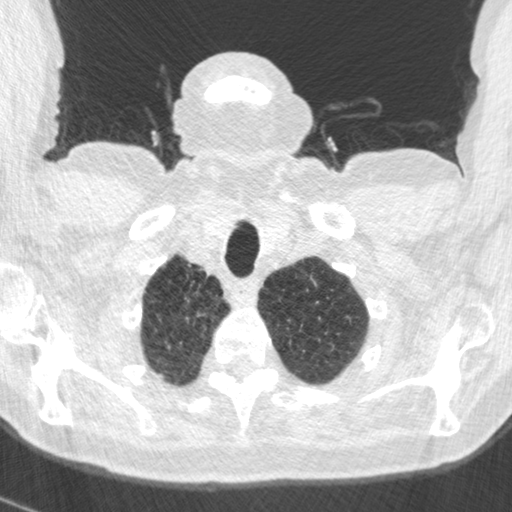
[im 253/265  lung]
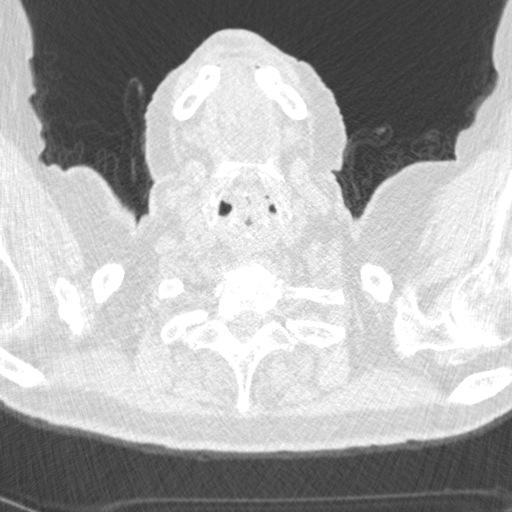

[15 of 40 positions shown; findings below may reference images not displayed]

FINDINGS: Cardiovascular: Previous median sternotomy and CABG procedure. There
is aortic atherosclerosis. No pericardial effusion.

Mediastinum/Nodes: The trachea appears patent and is midline. Normal
appearance of the esophagus. No mediastinal or hilar adenopathy
identified.

Lungs/Pleura: Advanced changes of centrilobular emphysema. Diffuse
bronchial wall thickening identified. Calcified and noncalcified
nodules are identified. The largest noncalcified nodule has an
equivalent diameter of 6.4 mm and is in the anterior right upper
lobe, image 44 of series 3.

Upper Abdomen: No acute abnormality.

Musculoskeletal: No chest wall mass or suspicious bone lesions
identified.
IMPRESSION: 1. Lung-Rads category 3, probably benign findings. Short-term
follow-up in 6 months is recommended with repeat low-dose chest CT
without contrast (please use the following order, "CT CHEST LCS
NODULE FOLLOW-UP W/O CM").
2. Diffuse bronchial wall thickening with emphysema, as above;
imaging findings suggestive of underlying COPD.
3. Aortic atherosclerosis
4. Prior granulomatous disease.

## 2016-08-15 DIAGNOSIS — L97312 Non-pressure chronic ulcer of right ankle with fat layer exposed: Secondary | ICD-10-CM | POA: Diagnosis not present

## 2016-08-15 DIAGNOSIS — E1151 Type 2 diabetes mellitus with diabetic peripheral angiopathy without gangrene: Secondary | ICD-10-CM | POA: Diagnosis not present

## 2016-08-15 DIAGNOSIS — E11622 Type 2 diabetes mellitus with other skin ulcer: Secondary | ICD-10-CM | POA: Diagnosis not present

## 2016-08-15 DIAGNOSIS — I743 Embolism and thrombosis of arteries of the lower extremities: Secondary | ICD-10-CM | POA: Diagnosis not present

## 2016-08-15 DIAGNOSIS — Z4800 Encounter for change or removal of nonsurgical wound dressing: Secondary | ICD-10-CM | POA: Diagnosis not present

## 2016-08-15 DIAGNOSIS — L97812 Non-pressure chronic ulcer of other part of right lower leg with fat layer exposed: Secondary | ICD-10-CM | POA: Diagnosis not present

## 2016-08-18 DIAGNOSIS — L97812 Non-pressure chronic ulcer of other part of right lower leg with fat layer exposed: Secondary | ICD-10-CM | POA: Diagnosis not present

## 2016-08-18 DIAGNOSIS — I743 Embolism and thrombosis of arteries of the lower extremities: Secondary | ICD-10-CM | POA: Diagnosis not present

## 2016-08-18 DIAGNOSIS — E1151 Type 2 diabetes mellitus with diabetic peripheral angiopathy without gangrene: Secondary | ICD-10-CM | POA: Diagnosis not present

## 2016-08-18 DIAGNOSIS — Z4800 Encounter for change or removal of nonsurgical wound dressing: Secondary | ICD-10-CM | POA: Diagnosis not present

## 2016-08-18 DIAGNOSIS — L97312 Non-pressure chronic ulcer of right ankle with fat layer exposed: Secondary | ICD-10-CM | POA: Diagnosis not present

## 2016-08-18 DIAGNOSIS — E11622 Type 2 diabetes mellitus with other skin ulcer: Secondary | ICD-10-CM | POA: Diagnosis not present

## 2016-08-20 DIAGNOSIS — L97213 Non-pressure chronic ulcer of right calf with necrosis of muscle: Secondary | ICD-10-CM | POA: Diagnosis not present

## 2016-08-20 DIAGNOSIS — E1151 Type 2 diabetes mellitus with diabetic peripheral angiopathy without gangrene: Secondary | ICD-10-CM | POA: Diagnosis not present

## 2016-08-20 DIAGNOSIS — S90821A Blister (nonthermal), right foot, initial encounter: Secondary | ICD-10-CM | POA: Diagnosis not present

## 2016-08-20 DIAGNOSIS — S81801A Unspecified open wound, right lower leg, initial encounter: Secondary | ICD-10-CM | POA: Diagnosis not present

## 2016-08-20 DIAGNOSIS — I10 Essential (primary) hypertension: Secondary | ICD-10-CM | POA: Diagnosis not present

## 2016-08-20 DIAGNOSIS — L97812 Non-pressure chronic ulcer of other part of right lower leg with fat layer exposed: Secondary | ICD-10-CM | POA: Diagnosis not present

## 2016-08-20 DIAGNOSIS — J449 Chronic obstructive pulmonary disease, unspecified: Secondary | ICD-10-CM | POA: Diagnosis not present

## 2016-08-20 DIAGNOSIS — S91301A Unspecified open wound, right foot, initial encounter: Secondary | ICD-10-CM | POA: Diagnosis not present

## 2016-08-20 DIAGNOSIS — E11622 Type 2 diabetes mellitus with other skin ulcer: Secondary | ICD-10-CM | POA: Diagnosis not present

## 2016-08-25 DIAGNOSIS — L97312 Non-pressure chronic ulcer of right ankle with fat layer exposed: Secondary | ICD-10-CM | POA: Diagnosis not present

## 2016-08-25 DIAGNOSIS — I743 Embolism and thrombosis of arteries of the lower extremities: Secondary | ICD-10-CM | POA: Diagnosis not present

## 2016-08-25 DIAGNOSIS — E11622 Type 2 diabetes mellitus with other skin ulcer: Secondary | ICD-10-CM | POA: Diagnosis not present

## 2016-08-25 DIAGNOSIS — Z4800 Encounter for change or removal of nonsurgical wound dressing: Secondary | ICD-10-CM | POA: Diagnosis not present

## 2016-08-25 DIAGNOSIS — D510 Vitamin B12 deficiency anemia due to intrinsic factor deficiency: Secondary | ICD-10-CM | POA: Diagnosis not present

## 2016-08-25 DIAGNOSIS — L97812 Non-pressure chronic ulcer of other part of right lower leg with fat layer exposed: Secondary | ICD-10-CM | POA: Diagnosis not present

## 2016-08-25 DIAGNOSIS — E1151 Type 2 diabetes mellitus with diabetic peripheral angiopathy without gangrene: Secondary | ICD-10-CM | POA: Diagnosis not present

## 2016-08-27 DIAGNOSIS — E11622 Type 2 diabetes mellitus with other skin ulcer: Secondary | ICD-10-CM | POA: Diagnosis not present

## 2016-08-27 DIAGNOSIS — L97312 Non-pressure chronic ulcer of right ankle with fat layer exposed: Secondary | ICD-10-CM | POA: Diagnosis not present

## 2016-08-27 DIAGNOSIS — Z4800 Encounter for change or removal of nonsurgical wound dressing: Secondary | ICD-10-CM | POA: Diagnosis not present

## 2016-08-27 DIAGNOSIS — E1151 Type 2 diabetes mellitus with diabetic peripheral angiopathy without gangrene: Secondary | ICD-10-CM | POA: Diagnosis not present

## 2016-08-27 DIAGNOSIS — I743 Embolism and thrombosis of arteries of the lower extremities: Secondary | ICD-10-CM | POA: Diagnosis not present

## 2016-08-27 DIAGNOSIS — L97812 Non-pressure chronic ulcer of other part of right lower leg with fat layer exposed: Secondary | ICD-10-CM | POA: Diagnosis not present

## 2016-09-01 DIAGNOSIS — E1151 Type 2 diabetes mellitus with diabetic peripheral angiopathy without gangrene: Secondary | ICD-10-CM | POA: Diagnosis not present

## 2016-09-01 DIAGNOSIS — L97312 Non-pressure chronic ulcer of right ankle with fat layer exposed: Secondary | ICD-10-CM | POA: Diagnosis not present

## 2016-09-01 DIAGNOSIS — I743 Embolism and thrombosis of arteries of the lower extremities: Secondary | ICD-10-CM | POA: Diagnosis not present

## 2016-09-01 DIAGNOSIS — Z4800 Encounter for change or removal of nonsurgical wound dressing: Secondary | ICD-10-CM | POA: Diagnosis not present

## 2016-09-01 DIAGNOSIS — L97812 Non-pressure chronic ulcer of other part of right lower leg with fat layer exposed: Secondary | ICD-10-CM | POA: Diagnosis not present

## 2016-09-01 DIAGNOSIS — E11622 Type 2 diabetes mellitus with other skin ulcer: Secondary | ICD-10-CM | POA: Diagnosis not present

## 2016-09-03 ENCOUNTER — Encounter (HOSPITAL_BASED_OUTPATIENT_CLINIC_OR_DEPARTMENT_OTHER): Payer: Medicare Other | Attending: Internal Medicine

## 2016-09-03 ENCOUNTER — Encounter: Payer: Self-pay | Admitting: Surgery

## 2016-09-03 DIAGNOSIS — E1151 Type 2 diabetes mellitus with diabetic peripheral angiopathy without gangrene: Secondary | ICD-10-CM | POA: Diagnosis not present

## 2016-09-03 DIAGNOSIS — E11622 Type 2 diabetes mellitus with other skin ulcer: Secondary | ICD-10-CM | POA: Diagnosis not present

## 2016-09-03 DIAGNOSIS — L97312 Non-pressure chronic ulcer of right ankle with fat layer exposed: Secondary | ICD-10-CM | POA: Diagnosis not present

## 2016-09-03 DIAGNOSIS — Z9221 Personal history of antineoplastic chemotherapy: Secondary | ICD-10-CM | POA: Insufficient documentation

## 2016-09-03 DIAGNOSIS — L97213 Non-pressure chronic ulcer of right calf with necrosis of muscle: Secondary | ICD-10-CM | POA: Diagnosis not present

## 2016-09-03 DIAGNOSIS — L97221 Non-pressure chronic ulcer of left calf limited to breakdown of skin: Secondary | ICD-10-CM | POA: Diagnosis not present

## 2016-09-03 DIAGNOSIS — S91301A Unspecified open wound, right foot, initial encounter: Secondary | ICD-10-CM | POA: Diagnosis not present

## 2016-09-03 DIAGNOSIS — J449 Chronic obstructive pulmonary disease, unspecified: Secondary | ICD-10-CM | POA: Diagnosis not present

## 2016-09-03 DIAGNOSIS — I2510 Atherosclerotic heart disease of native coronary artery without angina pectoris: Secondary | ICD-10-CM | POA: Diagnosis not present

## 2016-09-03 DIAGNOSIS — I10 Essential (primary) hypertension: Secondary | ICD-10-CM | POA: Insufficient documentation

## 2016-09-03 DIAGNOSIS — L97812 Non-pressure chronic ulcer of other part of right lower leg with fat layer exposed: Secondary | ICD-10-CM | POA: Diagnosis not present

## 2016-09-03 DIAGNOSIS — I739 Peripheral vascular disease, unspecified: Secondary | ICD-10-CM | POA: Insufficient documentation

## 2016-09-04 DIAGNOSIS — E11622 Type 2 diabetes mellitus with other skin ulcer: Secondary | ICD-10-CM | POA: Diagnosis not present

## 2016-09-04 DIAGNOSIS — L97812 Non-pressure chronic ulcer of other part of right lower leg with fat layer exposed: Secondary | ICD-10-CM | POA: Diagnosis not present

## 2016-09-04 DIAGNOSIS — L97312 Non-pressure chronic ulcer of right ankle with fat layer exposed: Secondary | ICD-10-CM | POA: Diagnosis not present

## 2016-09-04 DIAGNOSIS — Z4800 Encounter for change or removal of nonsurgical wound dressing: Secondary | ICD-10-CM | POA: Diagnosis not present

## 2016-09-04 DIAGNOSIS — E1151 Type 2 diabetes mellitus with diabetic peripheral angiopathy without gangrene: Secondary | ICD-10-CM | POA: Diagnosis not present

## 2016-09-04 DIAGNOSIS — I743 Embolism and thrombosis of arteries of the lower extremities: Secondary | ICD-10-CM | POA: Diagnosis not present

## 2016-09-07 ENCOUNTER — Other Ambulatory Visit: Payer: Self-pay

## 2016-09-07 ENCOUNTER — Encounter: Payer: Self-pay | Admitting: Surgery

## 2016-09-07 ENCOUNTER — Ambulatory Visit (INDEPENDENT_AMBULATORY_CARE_PROVIDER_SITE_OTHER): Payer: Medicare Other | Admitting: Surgery

## 2016-09-07 VITALS — BP 160/90 | HR 97 | Temp 97.1°F | Resp 18 | Ht 61.0 in | Wt 134.9 lb

## 2016-09-07 DIAGNOSIS — I70233 Atherosclerosis of native arteries of right leg with ulceration of ankle: Secondary | ICD-10-CM | POA: Diagnosis not present

## 2016-09-07 NOTE — Progress Notes (Signed)
[facility]  Patient name: Reginald Ayers MRN: [id number] DOB: 1951/07/26 Sex: male   REFERRING PROVIDER:    Dr. Leanord Hawkingobson   REASON FOR CONSULT:    Right leg ulcer  HISTORY OF PRESENT ILLNESS:   Reginald GalaWilliam H Besaw is a 66 y.o. male, who is Referred to me for a second opinion regarding ulcers on his right leg.  He states they have been present for approximately 8 months.  He has a duplex of the right leg would suggest popliteal occlusion as well as tibial vessel disease.  His most recent ABI was 0.47 on the right.  The patient currently is getting wound care 3 times a week with [facility].  The patient denies any real claudication symptoms.  The patient has a history of carotid occlusive disease.  He has a chronic occlusion of the left internal carotid artery and 60-79% right carotid stenosis.  The last imaging study was one year ago.  He has a history of smoking.  The patient is status post CABG in 2010.  He suffers from COPD.  He is on a statin for hypercholesterolemia.  He is a type II diabetic.  Past Medical History:  Diagnosis Date  . Carotid artery disease (HCC)    L-ICA 100%, mod R-ICA dz  . COPD (chronic obstructive pulmonary disease) (HCC)   . Coronary artery disease    s/p CABG February 2010 by Dr. Andrey SpearmanSteve Hendrickson  . Diabetes (HCC)   . Emphysema lung (HCC)   . Hyperlipidemia   . Hypertension   . Peripheral arterial disease (HCC)    post left common iliac and right SFA stenting remotely  . Tobacco abuse          FAMILY HISTORY   Family History  Problem Relation Age of Onset  . Heart attack Mother   . CVA Mother   . Diabetes Sister   . Hypertension Sister     SOCIAL HISTORY:   Social History   Social History  . Marital status: Single    Spouse name: N/A  . Number of children: N/A  . Years of education: N/A   Occupational History  . Retired Radiographer, [employer]    Social History Main Topics  .  Smoking status: Light Tobacco Smoker    Packs/day: 0.25    Types: Cigarettes    Last attempt to quit: 08/07/2016  . Smokeless tobacco: Current User    Types: Chew  . Alcohol use No     Comment: Weekend drinker, quit 2000.   . Drug use: No  . Sexual activity: Not on file   Other Topics Concern  . Not on file   Social History Narrative   [place]ives in [place]. Has 2 roommates, they both smoke. Has 9 dogs.    No Known Allergies  Current Outpatient Prescriptions  Medication Sig Dispense Refill  . albuterol (PROVENTIL HFA;VENTOLIN HFA) 108 (90 Base) MCG/ACT inhaler Inhale 2 puffs into the lungs every 6 (six) hours as needed for wheezing or shortness of breath.    . clopidogrel (PLAVIX) 75 MG tablet Take 75 mg by mouth daily.     . Cyanocobalamin (VITAMIN B-12 IJ) Inject 1 application as directed every 30 (thirty) days.     Marland Kitchen. glipiZIDE (GLUCOTROL XL) 10 MG 24 hr tablet Take 10 mg by mouth 2 (two) times daily.     . irbesartan (AVAPRO) 300 MG tablet Take 300 mg by mouth daily.     . metFORMIN (GLUCOPHAGE)  500 MG tablet Take 1,000 mg by mouth 2 (two) times daily with a meal.     . NITROSTAT 0.4 MG SL tablet Take 1 tablet by mouth every 5 (five) minutes as needed for chest pain.   0  . simvastatin (ZOCOR) 20 MG tablet Take 1 tablet (20 mg total) by mouth daily. 30 tablet 1  . SPIRIVA HANDIHALER 18 MCG inhalation capsule Place 18 mcg into inhaler and inhale daily.     . traMADol (ULTRAM) 50 MG tablet Take 50 mg by mouth every 12 (twelve) hours as needed for severe pain.      No current facility-administered medications for this visit.     REVIEW OF SYSTEMS:   [X]  denotes positive finding, [ ]  denotes negative finding Cardiac  Comments:  Chest pain or chest pressure:    Shortness of breath upon exertion:    Short of breath when lying flat:    Irregular heart rhythm:        Vascular    Pain in calf, thigh, or hip brought on by ambulation: x   Pain in feet at night that wakes you up  from your sleep:     Blood clot in your veins:    Leg swelling:  x       Pulmonary    Oxygen at home:    Productive cough:  x   Wheezing:         Neurologic    Sudden weakness in arms or legs:     Sudden numbness in arms or legs:     Sudden onset of difficulty speaking or slurred speech:    Temporary loss of vision in one eye:     Problems with dizziness:         Gastrointestinal    Blood in stool:      Vomited blood:         Genitourinary    Burning when urinating:     Blood in urine:        Psychiatric    Major depression:         Hematologic    Bleeding problems:    Problems with blood clotting too easily:        Skin    Rashes or ulcers:        Constitutional    Fever or chills:     PHYSICAL EXAM:   Vitals:   09/07/16 1348 09/07/16 1349  BP: (!) 157/91 (!) 160/90  Pulse: 97   Resp: 18   Temp: 97.1 F (36.2 C)   TempSrc: Oral   SpO2: 99%   Weight: 134 lb 14.4 oz (61.2 kg)   Height: 5\' 1"  (1.549 m)     GENERAL: The patient is a well-nourished male, in no acute distress. The vital signs are documented above. CARDIAC: There is a regular rate and rhythm.  VASCULAR: Nonpalpable pedal pulses PULMONARY: Nonlabored respirations ABDOMEN: Soft and non-tender with normal pitched bowel sounds.  MUSCULOSKELETAL: There are no major deformities or cyanosis. NEUROLOGIC: No focal weakness or paresthesias are detected. SKIN: Too large of (6 cm ulcers on the anterior lateral aspect of the right foot with dependent rubor and edema PSYCHIATRIC: The patient has a normal affect.  STUDIES:   I have reviewed his most recent vascular lab studies which were several months ago indicating outflow and runoff disease on the right with an ABI of 0.47.  The ABI on the left is 0.65  I have also reviewed his carotid  Doppler studies, the most recent of which was 1 year ago revealing an occluded left carotid artery and a 60-79% right carotid stenosis  ASSESSMENT and PLAN    Atherosclerosis with ulcer, right leg: It doesn't appear as if the patient has ever had angiographic evaluation of the right leg.  Therefore, I have recommended proceeding with a right lower extremity arteriogram and intervention as indicated.  I'll plan on cannulating the left groin.  He understands that he may be a candidate for percutaneous intervention or he may require surgical intervention.  In addition, he may not be an option for any type of revascularization.  This will not be known until he undergoes angiography which has been scheduled for Tuesday, January 30.  I did discuss with the patient and this is a limb threatening situation.  Carotid occlusive disease: The patient has yet to get a carotid duplex this year.  His most recent was in December 2016.  He states that he is due for follow-up.  I will defer back to cardiology for this.   [name], MD [facility] [phone] Pager [phone]

## 2016-09-08 DIAGNOSIS — E11622 Type 2 diabetes mellitus with other skin ulcer: Secondary | ICD-10-CM | POA: Diagnosis not present

## 2016-09-08 DIAGNOSIS — I743 Embolism and thrombosis of arteries of the lower extremities: Secondary | ICD-10-CM | POA: Diagnosis not present

## 2016-09-08 DIAGNOSIS — Z4800 Encounter for change or removal of nonsurgical wound dressing: Secondary | ICD-10-CM | POA: Diagnosis not present

## 2016-09-08 DIAGNOSIS — L97312 Non-pressure chronic ulcer of right ankle with fat layer exposed: Secondary | ICD-10-CM | POA: Diagnosis not present

## 2016-09-08 DIAGNOSIS — E1151 Type 2 diabetes mellitus with diabetic peripheral angiopathy without gangrene: Secondary | ICD-10-CM | POA: Diagnosis not present

## 2016-09-08 DIAGNOSIS — L97812 Non-pressure chronic ulcer of other part of right lower leg with fat layer exposed: Secondary | ICD-10-CM | POA: Diagnosis not present

## 2016-09-14 DIAGNOSIS — E1151 Type 2 diabetes mellitus with diabetic peripheral angiopathy without gangrene: Secondary | ICD-10-CM | POA: Diagnosis not present

## 2016-09-14 DIAGNOSIS — Z4800 Encounter for change or removal of nonsurgical wound dressing: Secondary | ICD-10-CM | POA: Diagnosis not present

## 2016-09-14 DIAGNOSIS — E11622 Type 2 diabetes mellitus with other skin ulcer: Secondary | ICD-10-CM | POA: Diagnosis not present

## 2016-09-14 DIAGNOSIS — I743 Embolism and thrombosis of arteries of the lower extremities: Secondary | ICD-10-CM | POA: Diagnosis not present

## 2016-09-14 DIAGNOSIS — L97312 Non-pressure chronic ulcer of right ankle with fat layer exposed: Secondary | ICD-10-CM | POA: Diagnosis not present

## 2016-09-14 DIAGNOSIS — L97812 Non-pressure chronic ulcer of other part of right lower leg with fat layer exposed: Secondary | ICD-10-CM | POA: Diagnosis not present

## 2016-09-17 DIAGNOSIS — S90821A Blister (nonthermal), right foot, initial encounter: Secondary | ICD-10-CM | POA: Diagnosis not present

## 2016-09-17 DIAGNOSIS — Z7902 Long term (current) use of antithrombotics/antiplatelets: Secondary | ICD-10-CM | POA: Diagnosis not present

## 2016-09-17 DIAGNOSIS — Z4800 Encounter for change or removal of nonsurgical wound dressing: Secondary | ICD-10-CM | POA: Diagnosis not present

## 2016-09-17 DIAGNOSIS — L97312 Non-pressure chronic ulcer of right ankle with fat layer exposed: Secondary | ICD-10-CM | POA: Diagnosis not present

## 2016-09-17 DIAGNOSIS — Z7984 Long term (current) use of oral hypoglycemic drugs: Secondary | ICD-10-CM | POA: Diagnosis not present

## 2016-09-17 DIAGNOSIS — E11622 Type 2 diabetes mellitus with other skin ulcer: Secondary | ICD-10-CM | POA: Diagnosis not present

## 2016-09-17 DIAGNOSIS — I743 Embolism and thrombosis of arteries of the lower extremities: Secondary | ICD-10-CM | POA: Diagnosis not present

## 2016-09-17 DIAGNOSIS — Z7982 Long term (current) use of aspirin: Secondary | ICD-10-CM | POA: Diagnosis not present

## 2016-09-17 DIAGNOSIS — L97812 Non-pressure chronic ulcer of other part of right lower leg with fat layer exposed: Secondary | ICD-10-CM | POA: Diagnosis not present

## 2016-09-17 DIAGNOSIS — E1151 Type 2 diabetes mellitus with diabetic peripheral angiopathy without gangrene: Secondary | ICD-10-CM | POA: Diagnosis not present

## 2016-09-17 DIAGNOSIS — L97213 Non-pressure chronic ulcer of right calf with necrosis of muscle: Secondary | ICD-10-CM | POA: Diagnosis not present

## 2016-09-17 DIAGNOSIS — L97221 Non-pressure chronic ulcer of left calf limited to breakdown of skin: Secondary | ICD-10-CM | POA: Diagnosis not present

## 2016-09-17 DIAGNOSIS — J449 Chronic obstructive pulmonary disease, unspecified: Secondary | ICD-10-CM | POA: Diagnosis not present

## 2016-09-21 DIAGNOSIS — I743 Embolism and thrombosis of arteries of the lower extremities: Secondary | ICD-10-CM | POA: Diagnosis not present

## 2016-09-21 DIAGNOSIS — L97312 Non-pressure chronic ulcer of right ankle with fat layer exposed: Secondary | ICD-10-CM | POA: Diagnosis not present

## 2016-09-21 DIAGNOSIS — E1151 Type 2 diabetes mellitus with diabetic peripheral angiopathy without gangrene: Secondary | ICD-10-CM | POA: Diagnosis not present

## 2016-09-21 DIAGNOSIS — Z4800 Encounter for change or removal of nonsurgical wound dressing: Secondary | ICD-10-CM | POA: Diagnosis not present

## 2016-09-21 DIAGNOSIS — E11622 Type 2 diabetes mellitus with other skin ulcer: Secondary | ICD-10-CM | POA: Diagnosis not present

## 2016-09-21 DIAGNOSIS — L97812 Non-pressure chronic ulcer of other part of right lower leg with fat layer exposed: Secondary | ICD-10-CM | POA: Diagnosis not present

## 2016-09-22 ENCOUNTER — Ambulatory Visit (HOSPITAL_COMMUNITY)
Admission: RE | Admit: 2016-09-22 | Discharge: 2016-09-22 | Disposition: A | Discharge status: Home / Self Care | Payer: Medicare Other | Source: Ambulatory Visit | Attending: Surgery | Admitting: Surgery

## 2016-09-22 ENCOUNTER — Other Ambulatory Visit: Payer: Self-pay | Admitting: *Deleted

## 2016-09-22 ENCOUNTER — Encounter (HOSPITAL_COMMUNITY)
Admission: RE | Disposition: A | Discharge status: Home / Self Care | Payer: Self-pay | Source: Ambulatory Visit | Attending: Surgery

## 2016-09-22 DIAGNOSIS — F17210 Nicotine dependence, cigarettes, uncomplicated: Secondary | ICD-10-CM | POA: Insufficient documentation

## 2016-09-22 DIAGNOSIS — E785 Hyperlipidemia, unspecified: Secondary | ICD-10-CM | POA: Insufficient documentation

## 2016-09-22 DIAGNOSIS — T82856A Stenosis of peripheral vascular stent, initial encounter: Secondary | ICD-10-CM | POA: Diagnosis not present

## 2016-09-22 DIAGNOSIS — I2510 Atherosclerotic heart disease of native coronary artery without angina pectoris: Secondary | ICD-10-CM | POA: Diagnosis not present

## 2016-09-22 DIAGNOSIS — I7092 Chronic total occlusion of artery of the extremities: Secondary | ICD-10-CM | POA: Diagnosis not present

## 2016-09-22 DIAGNOSIS — J449 Chronic obstructive pulmonary disease, unspecified: Secondary | ICD-10-CM | POA: Insufficient documentation

## 2016-09-22 DIAGNOSIS — Z8249 Family history of ischemic heart disease and other diseases of the circulatory system: Secondary | ICD-10-CM | POA: Diagnosis not present

## 2016-09-22 DIAGNOSIS — I70245 Atherosclerosis of native arteries of left leg with ulceration of other part of foot: Secondary | ICD-10-CM | POA: Diagnosis not present

## 2016-09-22 DIAGNOSIS — I6523 Occlusion and stenosis of bilateral carotid arteries: Secondary | ICD-10-CM | POA: Insufficient documentation

## 2016-09-22 DIAGNOSIS — Z7984 Long term (current) use of oral hypoglycemic drugs: Secondary | ICD-10-CM | POA: Diagnosis not present

## 2016-09-22 DIAGNOSIS — Z823 Family history of stroke: Secondary | ICD-10-CM | POA: Diagnosis not present

## 2016-09-22 DIAGNOSIS — Z7902 Long term (current) use of antithrombotics/antiplatelets: Secondary | ICD-10-CM | POA: Diagnosis not present

## 2016-09-22 DIAGNOSIS — L97519 Non-pressure chronic ulcer of other part of right foot with unspecified severity: Secondary | ICD-10-CM | POA: Diagnosis not present

## 2016-09-22 DIAGNOSIS — E7800 Pure hypercholesterolemia, unspecified: Secondary | ICD-10-CM | POA: Insufficient documentation

## 2016-09-22 DIAGNOSIS — I700 Atherosclerosis of aorta: Secondary | ICD-10-CM | POA: Insufficient documentation

## 2016-09-22 DIAGNOSIS — F17220 Nicotine dependence, chewing tobacco, uncomplicated: Secondary | ICD-10-CM | POA: Diagnosis not present

## 2016-09-22 DIAGNOSIS — I10 Essential (primary) hypertension: Secondary | ICD-10-CM | POA: Insufficient documentation

## 2016-09-22 DIAGNOSIS — I70239 Atherosclerosis of native arteries of right leg with ulceration of unspecified site: Secondary | ICD-10-CM | POA: Diagnosis not present

## 2016-09-22 DIAGNOSIS — E11621 Type 2 diabetes mellitus with foot ulcer: Secondary | ICD-10-CM | POA: Diagnosis not present

## 2016-09-22 DIAGNOSIS — Z833 Family history of diabetes mellitus: Secondary | ICD-10-CM | POA: Insufficient documentation

## 2016-09-22 DIAGNOSIS — I739 Peripheral vascular disease, unspecified: Secondary | ICD-10-CM

## 2016-09-22 DIAGNOSIS — Y812 Prosthetic and other implants, materials and accessory general- and plastic-surgery devices associated with adverse incidents: Secondary | ICD-10-CM | POA: Diagnosis not present

## 2016-09-22 DIAGNOSIS — Z951 Presence of aortocoronary bypass graft: Secondary | ICD-10-CM | POA: Diagnosis not present

## 2016-09-22 DIAGNOSIS — E1151 Type 2 diabetes mellitus with diabetic peripheral angiopathy without gangrene: Secondary | ICD-10-CM | POA: Insufficient documentation

## 2016-09-22 DIAGNOSIS — Z9862 Peripheral vascular angioplasty status: Secondary | ICD-10-CM

## 2016-09-22 HISTORY — PX: PERIPHERAL VASCULAR CATHETERIZATION: SHX172C

## 2016-09-22 LAB — POCT I-STAT, CHEM 8
BUN: 15 mg/dL (ref 6–20)
CALCIUM ION: 1.24 mmol/L (ref 1.15–1.40)
CREATININE: 0.7 mg/dL (ref 0.61–1.24)
Chloride: 103 mmol/L (ref 101–111)
GLUCOSE: 110 mg/dL — AB (ref 65–99)
HCT: 35 % — ABNORMAL LOW (ref 39.0–52.0)
HEMOGLOBIN: 11.9 g/dL — AB (ref 13.0–17.0)
POTASSIUM: 3.8 mmol/L (ref 3.5–5.1)
Sodium: 137 mmol/L (ref 135–145)
TCO2: 23 mmol/L (ref 0–100)

## 2016-09-22 LAB — POCT ACTIVATED CLOTTING TIME
ACTIVATED CLOTTING TIME: 235 s
ACTIVATED CLOTTING TIME: 279 s
ACTIVATED CLOTTING TIME: 224 s
Activated Clotting Time: 186 seconds

## 2016-09-22 LAB — GLUCOSE, CAPILLARY: GLUCOSE-CAPILLARY: 75 mg/dL (ref 65–99)

## 2016-09-22 SURGERY — ABDOMINAL AORTOGRAM W/LOWER EXTREMITY
Anesthesia: LOCAL | Laterality: Right

## 2016-09-22 MED ORDER — LIDOCAINE HCL (PF) 1 % IJ SOLN
INTRAMUSCULAR | Status: AC
  Filled 2016-09-22: qty 30

## 2016-09-22 MED ORDER — HEPARIN (PORCINE) IN NACL 2-0.9 UNIT/ML-% IJ SOLN
INTRAMUSCULAR | Status: AC
  Filled 2016-09-22: qty 1000

## 2016-09-22 MED ORDER — HEPARIN SODIUM (PORCINE) 1000 UNIT/ML IJ SOLN
INTRAMUSCULAR | Status: DC | PRN
  Administered 2016-09-22: 3000 [IU] via INTRAVENOUS
  Administered 2016-09-22: 6000 [IU] via INTRAVENOUS
  Administered 2016-09-22: 1000 [IU] via INTRAVENOUS

## 2016-09-22 MED ORDER — MIDAZOLAM HCL 2 MG/2ML IJ SOLN
INTRAMUSCULAR | Status: DC | PRN
  Administered 2016-09-22 (×4): 1 mg via INTRAVENOUS

## 2016-09-22 MED ORDER — NITROGLYCERIN 1 MG/10 ML FOR IR/CATH LAB
INTRA_ARTERIAL | Status: DC | PRN
  Administered 2016-09-22 (×2): 200 ug via INTRA_ARTERIAL

## 2016-09-22 MED ORDER — NITROGLYCERIN 0.2 MG/ML ON CALL CATH LAB
INTRAVENOUS | Status: AC
  Filled 2016-09-22: qty 1

## 2016-09-22 MED ORDER — IODIXANOL 320 MG/ML IV SOLN
INTRAVENOUS | Status: DC | PRN
  Administered 2016-09-22: 150 mL via INTRA_ARTERIAL

## 2016-09-22 MED ORDER — MORPHINE SULFATE (PF) 10 MG/ML IV SOLN: 2.0000 mg | INTRAVENOUS | Status: DC | PRN

## 2016-09-22 MED ORDER — MIDAZOLAM HCL 2 MG/2ML IJ SOLN
INTRAMUSCULAR | Status: AC
  Filled 2016-09-22: qty 2

## 2016-09-22 MED ORDER — GUAIFENESIN-DM 100-10 MG/5ML PO SYRP
15.0000 mL | ORAL_SOLUTION | ORAL | Status: DC | PRN
  Administered 2016-09-22: 15 mL via ORAL
  Filled 2016-09-22 (×2): qty 15

## 2016-09-22 MED ORDER — FENTANYL CITRATE (PF) 100 MCG/2ML IJ SOLN
INTRAMUSCULAR | Status: DC | PRN
  Administered 2016-09-22 (×4): 25 ug via INTRAVENOUS

## 2016-09-22 MED ORDER — ONDANSETRON HCL 4 MG/2ML IJ SOLN: 4.0000 mg | Freq: Four times a day (QID) | INTRAMUSCULAR | Status: DC | PRN

## 2016-09-22 MED ORDER — SODIUM CHLORIDE 0.9 % IV SOLN
INTRAVENOUS | Status: DC
  Administered 2016-09-22: 07:00:00 via INTRAVENOUS

## 2016-09-22 MED ORDER — FENTANYL CITRATE (PF) 100 MCG/2ML IJ SOLN
INTRAMUSCULAR | Status: AC
  Filled 2016-09-22: qty 2

## 2016-09-22 MED ORDER — SODIUM CHLORIDE 0.9 % IV SOLN: 1.0000 mL/kg/h | INTRAVENOUS | Status: DC

## 2016-09-22 MED ORDER — HYDRALAZINE HCL 20 MG/ML IJ SOLN: 5.0000 mg | INTRAMUSCULAR | Status: DC | PRN

## 2016-09-22 MED ORDER — HEPARIN SODIUM (PORCINE) 1000 UNIT/ML IJ SOLN
INTRAMUSCULAR | Status: AC
  Filled 2016-09-22: qty 1

## 2016-09-22 MED ORDER — OXYCODONE HCL 5 MG PO TABS: 5.0000 mg | ORAL_TABLET | ORAL | Status: DC | PRN

## 2016-09-22 MED ORDER — LIDOCAINE HCL (PF) 1 % IJ SOLN
INTRAMUSCULAR | Status: DC | PRN
  Administered 2016-09-22: 15 mL

## 2016-09-22 MED ORDER — HEPARIN (PORCINE) IN NACL 2-0.9 UNIT/ML-% IJ SOLN
INTRAMUSCULAR | Status: DC | PRN
  Administered 2016-09-22: 1000 mL

## 2016-09-22 MED ORDER — METOPROLOL TARTRATE 5 MG/5ML IV SOLN: 2.0000 mg | INTRAVENOUS | Status: DC | PRN

## 2016-09-22 SURGICAL SUPPLY — 33 items
BALLN COYOTE OTW 4X100X150 (BALLOONS) ×3
BALLN MUSTANG 6.0X40 135 (BALLOONS) ×3
BALLN STERLING OTW 6X150X150 (BALLOONS) ×3
BALLOON COYOTE OTW 4X100X150 (BALLOONS) IMPLANT
BALLOON MUSTANG 6.0X40 135 (BALLOONS) IMPLANT
BALLOON STERLING OTW 6X150X150 (BALLOONS) IMPLANT
CATH OMNI FLUSH 5F 65CM (CATHETERS) ×1 IMPLANT
CATH QUICKCROSS .018X135CM (MICROCATHETER) ×1 IMPLANT
CATH QUICKCROSS .035X135CM (MICROCATHETER) ×1 IMPLANT
CATH QUICKCROSS SUPP .035X90CM (MICROCATHETER) ×1 IMPLANT
COVER PRB 48X5XTLSCP FOLD TPE (BAG) IMPLANT
COVER PROBE 5X48 (BAG) ×6
DEVICE CONTINUOUS FLUSH (MISCELLANEOUS) ×1 IMPLANT
DEVICE TORQUE H2O (MISCELLANEOUS) ×1 IMPLANT
GUIDEWIRE ANGLED .035X150CM (WIRE) ×1 IMPLANT
GUIDEWIRE STR TIP .014X300X8 (WIRE) ×2 IMPLANT
KIT ENCORE 26 ADVANTAGE (KITS) ×1 IMPLANT
KIT PV (KITS) ×3 IMPLANT
SHEATH PINNACLE 5F 10CM (SHEATH) ×1 IMPLANT
SHEATH PINNACLE ST 7F 45CM (SHEATH) ×1 IMPLANT
SHIELD RADPAD SCOOP 12X17 (MISCELLANEOUS) ×1 IMPLANT
STENT INNOVA 7X60X130 (Permanent Stent) ×1 IMPLANT
STENT VIABAHN 6X250X120 (Permanent Stent) ×1 IMPLANT
STOPCOCK MORSE 400PSI 3WAY (MISCELLANEOUS) ×1 IMPLANT
SYRINGE MEDRAD AVANTA MACH 7 (SYRINGE) ×1 IMPLANT
TRANSDUCER W/STOPCOCK (MISCELLANEOUS) ×3 IMPLANT
TRAY PV CATH (CUSTOM PROCEDURE TRAY) ×3 IMPLANT
TUBING CIL FLEX 10 FLL-RA (TUBING) ×1 IMPLANT
WIRE BENTSON .035X145CM (WIRE) ×1 IMPLANT
WIRE G V18X300CM (WIRE) ×1 IMPLANT
WIRE HI TORQ VERSACORE J 260CM (WIRE) ×1 IMPLANT
WIRE MINI STICK MAX (SHEATH) ×1 IMPLANT
WIRE SPARTACORE .014X300CM (WIRE) ×2 IMPLANT

## 2016-09-22 NOTE — Interval H&P Note (Signed)
History and Physical Interval Note:  09/22/2016 8:45 AM  Bertram GalaWilliam H Standiford  has presented today for surgery, with the diagnosis of Rt Foot ulcer  The various methods of treatment have been discussed with the patient and family. After consideration of risks, benefits and other options for treatment, the patient has consented to  Procedure(s): Abdominal Aortogram w/ bilateral Lower Extremity Runoff (N/A) as a surgical intervention .  The patient's history has been reviewed, patient examined, no change in status, stable for surgery.  I have reviewed the patient's chart and labs.  Questions were answered to the patient's satisfaction.     Durene CalBrabham, [place]

## 2016-09-22 NOTE — Progress Notes (Signed)
Site area: Left groin a 7 french arterial sheath was removed  Site Prior to Removal:  Level 0  Pressure Applied For 40 MINUTES    Bedrest Beginning at 1300p  Manual:   Yes.    Patient Status During Pull:  stable  Post Pull Groin Site:  Level 0  Post Pull Instructions Given:  Yes.    Post Pull Pulses Present:  Yes.    Dressing Applied:  Yes.    Comments:  VS remain stable during sheath pull.  Pressure dressing applied due to chronic cough

## 2016-09-22 NOTE — Discharge Instructions (Signed)
°  HOLD METFORMIN FOR 48 HOURS. MAY RESUME ON FRI AM.   Femoral Site Care Introduction Refer to this sheet in the next few weeks. These instructions provide you with information about caring for yourself after your procedure. Your health care provider may also give you more specific instructions. Your treatment has been planned according to current medical practices, but problems sometimes occur. Call your health care provider if you have any problems or questions after your procedure. What can I expect after the procedure? After your procedure, it is typical to have the following:  Bruising at the site that usually fades within 1-2 weeks.  Blood collecting in the tissue (hematoma) that may be painful to the touch. It should usually decrease in size and tenderness within 1-2 weeks. Follow these instructions at home:  Take medicines only as directed by your health care provider.  You may shower 24-48 hours after the procedure or as directed by your health care provider. Remove the bandage (dressing) and gently wash the site with plain soap and water. Pat the area dry with a clean towel. Do not rub the site, because this may cause bleeding.  Do not take baths, swim, or use a hot tub until your health care provider approves.  Check your insertion site every day for redness, swelling, or drainage.  Do not apply powder or lotion to the site.  Limit use of stairs to twice a day for the first 2-3 days or as directed by your health care provider.  Do not squat for the first 2-3 days or as directed by your health care provider.  Do not lift over 10 lb (4.5 kg) for 5 days after your procedure or as directed by your health care provider.  Ask your health care provider when it is okay to:  Return to work or school.  Resume usual physical activities or sports.  Resume sexual activity.  Do not drive home if you are discharged the same day as the procedure. Have someone else drive you.  You  may drive 24 hours after the procedure unless otherwise instructed by your health care provider.  Do not operate machinery or power tools for 24 hours after the procedure or as directed by your health care provider.  If your procedure was done as an outpatient procedure, which means that you went home the same day as your procedure, a responsible adult should be with you for the first 24 hours after you arrive home.  Keep all follow-up visits as directed by your health care provider. This is important. Contact a health care provider if:  You have a fever.  You have chills.  You have increased bleeding from the site. Hold pressure on the site. Get help right away if:  You have unusual pain at the site.  You have redness, warmth, or swelling at the site.  You have drainage (other than a small amount of blood on the dressing) from the site.  The site is bleeding, and the bleeding does not stop after 30 minutes of holding steady pressure on the site.  Your leg or foot becomes pale, cool, tingly, or numb. This information is not intended to replace advice given to you by your health care provider. Make sure you discuss any questions you have with your health care provider. Document Released: 04/13/2014 Document Revised: 01/16/2016 Document Reviewed: 02/27/2014  2017 Elsevier

## 2016-09-22 NOTE — Op Note (Signed)
Patient name: Reginald Ayers MRN: [id number] DOB: Oct 12, 1950 Sex: male  09/22/2016 Pre-operative Diagnosis: Right foot ulcer Post-operative diagnosis:  Same Surgeon:  Durene CalBrabham, [place] Procedure Performed:  1.  Ultrasound-guided access, left femoral artery  2.  Abdominal aortogram  3.  Right lower extremity runoff  4.  Stent, right superficial femoral/popliteal artery  5.  Failed balloon angioplasty, right posterior tibial artery  6.  Intra-arterial administration of nitroglycerin  7.  Conscious sedation (112 minutes)   Indications:  The patient comes in with a nonhealing wound on his right foot.  Outside imaging studies revealed a superficial femoral stent occlusion.  He is here today for further evaluation and possible intervention  Procedure:  The patient was identified in the holding area and taken to room 8.  The patient was then placed supine on the table and prepped and draped in the usual sterile fashion.  A time out was called.  Conscious sedation was performed with the use of IV fentanyl and Versed under continuous physician and nurse monitoring.  Heart rate, blood pressure, and oxygen saturation were continuously monitored.  Ultrasound was used to evaluate the left common femoral artery.  It was patent .  A digital ultrasound image was acquired.  A micropuncture needle was used to access the left common femoral artery under ultrasound guidance.  An 018 wire was advanced without resistance and a micropuncture sheath was placed.  The 018 wire was removed and a benson wire was placed.  The micropuncture sheath was exchanged for a 5 french sheath.  An omniflush catheter was advanced over the wire to the level of L-1.  An abdominal angiogram was obtained.  Next, using the omniflush catheter and a benson wire, the aortic bifurcation was crossed and the catheter was placed into theright external iliac artery and right runoff was obtained.   Findings:   Aortogram:  No significant renal artery  stenosis.  The infrarenal abdominal aorta is calcified but patent without stenosis.  Bilateral common and external iliac arteries are widely patent  Right Lower Extremity:  Right common femoral and profunda femoral artery are widely patent.  The origin of the right superficial femoral artery has approximately a 60-70 percent stenosis.  There is a stent within the midportion of the superficial femoral artery which is occluded.  There is reconstitution beyond the stent.  The popliteal artery is patent throughout it's course.  The dominant vessel to the ankle is the peroneal artery.  There is reconstitution of the posterior tibial.  Posterior tibial artery does appear to be patent however there is a high-grade stenosis versus short segment occlusion in its midportion.  Left Lower Extremity:  Not evaluated  Intervention:  After the above images were acquired, the decision was made to proceed with intervention.  Over an 035 wire, a 7 French 45 cm sheath was advanced into the right common femoral artery.  The patient was fully heparinized.  Using a 035 Glidewire and a quick cross catheter, the stent occlusion was crossed successfully.  This was confirmed with a contrast injection through the quick cross catheter within the superficial femoral/popliteal artery.  Next, a V 14 wire was placed.  The stent was dilated with a 4 x 10 balloon.  I then dilated the area with a 6 x 1 50 balloon in order to predilate for the 6 x 250 Viabahn stent which was deployed within the superficial femoral and popliteal artery.  This was molded with a 6 mm balloon.  Attention  was then turned towards the posterior tibial artery.  I used the V 14 wire and a 018 quick cross catheter.  I tried multiple times to get across the what turned out to be a complete occlusion in the posterior tibial artery.  I ended up in a dissection plane and could not reenter into the artery despite using multiple wires.  The patient became somewhat restless and I  felt it was not safe to continue.  At this point I reevaluated the proximal superficial femoral artery and elected to stent this as there was still a residual 60-70% stenosis.  This was done using a 7 x 60 Innova self-expanding stent which was dilated with a 6 mm balloon.  Follow-up imaging shows luminal narrowing just beyond the distal portion of the Viabahn, and therefore I used a 6 x 40 Mustang balloon to perform a low pressure inflation at this level.  Follow-up revealed significantly improved results.  There was a small nonflow limiting dissection which was left alone.  At this point, catheters and wires were removed.  The patient was taken the holding area for sheath pull once correlation profile corrects.  Impression:  #1  occluded right superficial femoral artery stent which was successfully crossed.  I inserted a 6 x 2 50 Viabahn covered stent to treat the superficial femoral and popliteal artery lesions.  The proximal superficial femoral artery was treated with a 7 x 60 Innova  #2  severe stenosis versus occlusion of the posterior tibial artery which is the dominant vessel across the foot.  I attempted to cross this lesion but was unsuccessful.  If the patient is unable to heal his wound I will bring him back for a repeat attempt from the left groin    V. [name], M.D. [facility] [place] Office: [phone] Pager:  [phone]

## 2016-09-22 NOTE — H&P (View-Only) (Signed)
[facility]  Patient name: Reginald Ayers Jaffe MRN: [id number] DOB: 1951/07/26 Sex: male   REFERRING PROVIDER:    Dr. Leanord Hawkingobson   REASON FOR CONSULT:    Right leg ulcer  HISTORY OF PRESENT ILLNESS:   Reginald Ayers Besaw is a 66 y.o. male, who is Referred to me for a second opinion regarding ulcers on his right leg.  He states they have been present for approximately 8 months.  He has a duplex of the right leg would suggest popliteal occlusion as well as tibial vessel disease.  His most recent ABI was 0.47 on the right.  The patient currently is getting wound care 3 times a week with [facility].  The patient denies any real claudication symptoms.  The patient has a history of carotid occlusive disease.  He has a chronic occlusion of the left internal carotid artery and 60-79% right carotid stenosis.  The last imaging study was one year ago.  He has a history of smoking.  The patient is status post CABG in 2010.  He suffers from COPD.  He is on a statin for hypercholesterolemia.  He is a type II diabetic.  Past Medical History:  Diagnosis Date  . Carotid artery disease (HCC)    L-ICA 100%, mod R-ICA dz  . COPD (chronic obstructive pulmonary disease) (HCC)   . Coronary artery disease    s/p CABG February 2010 by Dr. Andrey SpearmanSteve Hendrickson  . Diabetes (HCC)   . Emphysema lung (HCC)   . Hyperlipidemia   . Hypertension   . Peripheral arterial disease (HCC)    post left common iliac and right SFA stenting remotely  . Tobacco abuse          FAMILY HISTORY   Family History  Problem Relation Age of Onset  . Heart attack Mother   . CVA Mother   . Diabetes Sister   . Hypertension Sister     SOCIAL HISTORY:   Social History   Social History  . Marital status: Single    Spouse name: N/A  . Number of children: N/A  . Years of education: N/A   Occupational History  . Retired Radiographer, [employer]    Social History Main Topics  .  Smoking status: Light Tobacco Smoker    Packs/day: 0.25    Types: Cigarettes    Last attempt to quit: 08/07/2016  . Smokeless tobacco: Current User    Types: Chew  . Alcohol use No     Comment: Weekend drinker, quit 2000.   . Drug use: No  . Sexual activity: Not on file   Other Topics Concern  . Not on file   Social History Narrative   Lives in [place]. Has 2 roommates, they both smoke. Has 9 dogs.    No Known Allergies  Current Outpatient Prescriptions  Medication Sig Dispense Refill  . albuterol (PROVENTIL HFA;VENTOLIN HFA) 108 (90 Base) MCG/ACT inhaler Inhale 2 puffs into the lungs every 6 (six) hours as needed for wheezing or shortness of breath.    . clopidogrel (PLAVIX) 75 MG tablet Take 75 mg by mouth daily.     . Cyanocobalamin (VITAMIN B-12 IJ) Inject 1 application as directed every 30 (thirty) days.     Marland Kitchen. glipiZIDE (GLUCOTROL XL) 10 MG 24 hr tablet Take 10 mg by mouth 2 (two) times daily.     . irbesartan (AVAPRO) 300 MG tablet Take 300 mg by mouth daily.     . metFORMIN (GLUCOPHAGE)  500 MG tablet Take 1,000 mg by mouth 2 (two) times daily with a meal.     . NITROSTAT 0.4 MG SL tablet Take 1 tablet by mouth every 5 (five) minutes as needed for chest pain.   0  . simvastatin (ZOCOR) 20 MG tablet Take 1 tablet (20 mg total) by mouth daily. 30 tablet 1  . SPIRIVA HANDIHALER 18 MCG inhalation capsule Place 18 mcg into inhaler and inhale daily.     . traMADol (ULTRAM) 50 MG tablet Take 50 mg by mouth every 12 (twelve) hours as needed for severe pain.      No current facility-administered medications for this visit.     REVIEW OF SYSTEMS:   [X]  denotes positive finding, [ ]  denotes negative finding Cardiac  Comments:  Chest pain or chest pressure:    Shortness of breath upon exertion:    Short of breath when lying flat:    Irregular heart rhythm:        Vascular    Pain in calf, thigh, or hip brought on by ambulation: x   Pain in feet at night that wakes you up  from your sleep:     Blood clot in your veins:    Leg swelling:  x       Pulmonary    Oxygen at home:    Productive cough:  x   Wheezing:         Neurologic    Sudden weakness in arms or legs:     Sudden numbness in arms or legs:     Sudden onset of difficulty speaking or slurred speech:    Temporary loss of vision in one eye:     Problems with dizziness:         Gastrointestinal    Blood in stool:      Vomited blood:         Genitourinary    Burning when urinating:     Blood in urine:        Psychiatric    Major depression:         Hematologic    Bleeding problems:    Problems with blood clotting too easily:        Skin    Rashes or ulcers:        Constitutional    Fever or chills:     PHYSICAL EXAM:   Vitals:   09/07/16 1348 09/07/16 1349  BP: (!) 157/91 (!) 160/90  Pulse: 97   Resp: 18   Temp: 97.1 F (36.2 C)   TempSrc: Oral   SpO2: 99%   Weight: 134 lb 14.4 oz (61.2 kg)   Height: 5\' 1"  (1.549 m)     GENERAL: The patient is a well-nourished male, in no acute distress. The vital signs are documented above. CARDIAC: There is a regular rate and rhythm.  VASCULAR: Nonpalpable pedal pulses PULMONARY: Nonlabored respirations ABDOMEN: Soft and non-tender with normal pitched bowel sounds.  MUSCULOSKELETAL: There are no major deformities or cyanosis. NEUROLOGIC: No focal weakness or paresthesias are detected. SKIN: Too large of (6 cm ulcers on the anterior lateral aspect of the right foot with dependent rubor and edema PSYCHIATRIC: The patient has a normal affect.  STUDIES:   I have reviewed his most recent vascular lab studies which were several months ago indicating outflow and runoff disease on the right with an ABI of 0.47.  The ABI on the left is 0.65  I have also reviewed his carotid  Doppler studies, the most recent of which was 1 year ago revealing an occluded left carotid artery and a 60-79% right carotid stenosis  ASSESSMENT and PLAN    Atherosclerosis with ulcer, right leg: It doesn't appear as if the patient has ever had angiographic evaluation of the right leg.  Therefore, I have recommended proceeding with a right lower extremity arteriogram and intervention as indicated.  I'll plan on cannulating the left groin.  He understands that he may be a candidate for percutaneous intervention or he may require surgical intervention.  In addition, he may not be an option for any type of revascularization.  This will not be known until he undergoes angiography which has been scheduled for Tuesday, January 30.  I did discuss with the patient and this is a limb threatening situation.  Carotid occlusive disease: The patient has yet to get a carotid duplex this year.  His most recent was in December 2016.  He states that he is due for follow-up.  I will defer back to cardiology for this.   [name], MD [facility] [phone] Pager [phone]

## 2016-09-23 ENCOUNTER — Encounter (HOSPITAL_COMMUNITY): Payer: Self-pay | Admitting: Surgery

## 2016-09-24 DIAGNOSIS — L97812 Non-pressure chronic ulcer of other part of right lower leg with fat layer exposed: Secondary | ICD-10-CM | POA: Diagnosis not present

## 2016-09-24 DIAGNOSIS — E11622 Type 2 diabetes mellitus with other skin ulcer: Secondary | ICD-10-CM | POA: Diagnosis not present

## 2016-09-24 DIAGNOSIS — E1151 Type 2 diabetes mellitus with diabetic peripheral angiopathy without gangrene: Secondary | ICD-10-CM | POA: Diagnosis not present

## 2016-09-24 DIAGNOSIS — L97312 Non-pressure chronic ulcer of right ankle with fat layer exposed: Secondary | ICD-10-CM | POA: Diagnosis not present

## 2016-09-24 DIAGNOSIS — I743 Embolism and thrombosis of arteries of the lower extremities: Secondary | ICD-10-CM | POA: Diagnosis not present

## 2016-09-24 DIAGNOSIS — Z4800 Encounter for change or removal of nonsurgical wound dressing: Secondary | ICD-10-CM | POA: Diagnosis not present

## 2016-09-28 DIAGNOSIS — L97812 Non-pressure chronic ulcer of other part of right lower leg with fat layer exposed: Secondary | ICD-10-CM | POA: Diagnosis not present

## 2016-09-28 DIAGNOSIS — Z4800 Encounter for change or removal of nonsurgical wound dressing: Secondary | ICD-10-CM | POA: Diagnosis not present

## 2016-09-28 DIAGNOSIS — E1151 Type 2 diabetes mellitus with diabetic peripheral angiopathy without gangrene: Secondary | ICD-10-CM | POA: Diagnosis not present

## 2016-09-28 DIAGNOSIS — E11622 Type 2 diabetes mellitus with other skin ulcer: Secondary | ICD-10-CM | POA: Diagnosis not present

## 2016-09-28 DIAGNOSIS — I743 Embolism and thrombosis of arteries of the lower extremities: Secondary | ICD-10-CM | POA: Diagnosis not present

## 2016-09-28 DIAGNOSIS — L97312 Non-pressure chronic ulcer of right ankle with fat layer exposed: Secondary | ICD-10-CM | POA: Diagnosis not present

## 2016-09-30 DIAGNOSIS — L97812 Non-pressure chronic ulcer of other part of right lower leg with fat layer exposed: Secondary | ICD-10-CM | POA: Diagnosis not present

## 2016-09-30 DIAGNOSIS — Z4800 Encounter for change or removal of nonsurgical wound dressing: Secondary | ICD-10-CM | POA: Diagnosis not present

## 2016-09-30 DIAGNOSIS — E11622 Type 2 diabetes mellitus with other skin ulcer: Secondary | ICD-10-CM | POA: Diagnosis not present

## 2016-09-30 DIAGNOSIS — I743 Embolism and thrombosis of arteries of the lower extremities: Secondary | ICD-10-CM | POA: Diagnosis not present

## 2016-09-30 DIAGNOSIS — L97312 Non-pressure chronic ulcer of right ankle with fat layer exposed: Secondary | ICD-10-CM | POA: Diagnosis not present

## 2016-09-30 DIAGNOSIS — E1151 Type 2 diabetes mellitus with diabetic peripheral angiopathy without gangrene: Secondary | ICD-10-CM | POA: Diagnosis not present

## 2016-10-01 ENCOUNTER — Encounter (HOSPITAL_BASED_OUTPATIENT_CLINIC_OR_DEPARTMENT_OTHER): Payer: Medicare Other | Attending: Internal Medicine

## 2016-10-01 DIAGNOSIS — Z95820 Peripheral vascular angioplasty status with implants and grafts: Secondary | ICD-10-CM | POA: Insufficient documentation

## 2016-10-01 DIAGNOSIS — L97812 Non-pressure chronic ulcer of other part of right lower leg with fat layer exposed: Secondary | ICD-10-CM | POA: Insufficient documentation

## 2016-10-01 DIAGNOSIS — I10 Essential (primary) hypertension: Secondary | ICD-10-CM | POA: Diagnosis not present

## 2016-10-01 DIAGNOSIS — E11622 Type 2 diabetes mellitus with other skin ulcer: Secondary | ICD-10-CM | POA: Insufficient documentation

## 2016-10-01 DIAGNOSIS — J449 Chronic obstructive pulmonary disease, unspecified: Secondary | ICD-10-CM | POA: Insufficient documentation

## 2016-10-01 DIAGNOSIS — I2510 Atherosclerotic heart disease of native coronary artery without angina pectoris: Secondary | ICD-10-CM | POA: Insufficient documentation

## 2016-10-01 DIAGNOSIS — L97512 Non-pressure chronic ulcer of other part of right foot with fat layer exposed: Secondary | ICD-10-CM | POA: Diagnosis not present

## 2016-10-01 DIAGNOSIS — Z9221 Personal history of antineoplastic chemotherapy: Secondary | ICD-10-CM | POA: Diagnosis not present

## 2016-10-01 DIAGNOSIS — S91301A Unspecified open wound, right foot, initial encounter: Secondary | ICD-10-CM | POA: Diagnosis not present

## 2016-10-01 DIAGNOSIS — E11621 Type 2 diabetes mellitus with foot ulcer: Secondary | ICD-10-CM | POA: Diagnosis not present

## 2016-10-05 DIAGNOSIS — L97312 Non-pressure chronic ulcer of right ankle with fat layer exposed: Secondary | ICD-10-CM | POA: Diagnosis not present

## 2016-10-05 DIAGNOSIS — I743 Embolism and thrombosis of arteries of the lower extremities: Secondary | ICD-10-CM | POA: Diagnosis not present

## 2016-10-05 DIAGNOSIS — Z4800 Encounter for change or removal of nonsurgical wound dressing: Secondary | ICD-10-CM | POA: Diagnosis not present

## 2016-10-05 DIAGNOSIS — E1151 Type 2 diabetes mellitus with diabetic peripheral angiopathy without gangrene: Secondary | ICD-10-CM | POA: Diagnosis not present

## 2016-10-05 DIAGNOSIS — E11622 Type 2 diabetes mellitus with other skin ulcer: Secondary | ICD-10-CM | POA: Diagnosis not present

## 2016-10-05 DIAGNOSIS — L97812 Non-pressure chronic ulcer of other part of right lower leg with fat layer exposed: Secondary | ICD-10-CM | POA: Diagnosis not present

## 2016-10-09 ENCOUNTER — Encounter (HOSPITAL_COMMUNITY): Payer: Medicare Other

## 2016-10-13 DIAGNOSIS — L97312 Non-pressure chronic ulcer of right ankle with fat layer exposed: Secondary | ICD-10-CM | POA: Diagnosis not present

## 2016-10-13 DIAGNOSIS — Z4800 Encounter for change or removal of nonsurgical wound dressing: Secondary | ICD-10-CM | POA: Diagnosis not present

## 2016-10-13 DIAGNOSIS — L97812 Non-pressure chronic ulcer of other part of right lower leg with fat layer exposed: Secondary | ICD-10-CM | POA: Diagnosis not present

## 2016-10-13 DIAGNOSIS — E1151 Type 2 diabetes mellitus with diabetic peripheral angiopathy without gangrene: Secondary | ICD-10-CM | POA: Diagnosis not present

## 2016-10-13 DIAGNOSIS — E11622 Type 2 diabetes mellitus with other skin ulcer: Secondary | ICD-10-CM | POA: Diagnosis not present

## 2016-10-13 DIAGNOSIS — I743 Embolism and thrombosis of arteries of the lower extremities: Secondary | ICD-10-CM | POA: Diagnosis not present

## 2016-10-15 DIAGNOSIS — J449 Chronic obstructive pulmonary disease, unspecified: Secondary | ICD-10-CM | POA: Diagnosis not present

## 2016-10-15 DIAGNOSIS — E11622 Type 2 diabetes mellitus with other skin ulcer: Secondary | ICD-10-CM | POA: Diagnosis not present

## 2016-10-15 DIAGNOSIS — L97512 Non-pressure chronic ulcer of other part of right foot with fat layer exposed: Secondary | ICD-10-CM | POA: Diagnosis not present

## 2016-10-15 DIAGNOSIS — L97812 Non-pressure chronic ulcer of other part of right lower leg with fat layer exposed: Secondary | ICD-10-CM | POA: Diagnosis not present

## 2016-10-15 DIAGNOSIS — Z95820 Peripheral vascular angioplasty status with implants and grafts: Secondary | ICD-10-CM | POA: Diagnosis not present

## 2016-10-15 DIAGNOSIS — E11621 Type 2 diabetes mellitus with foot ulcer: Secondary | ICD-10-CM | POA: Diagnosis not present

## 2016-10-20 ENCOUNTER — Other Ambulatory Visit: Payer: Self-pay | Admitting: Nurse Practitioner

## 2016-10-20 ENCOUNTER — Ambulatory Visit
Admission: RE | Admit: 2016-10-20 | Discharge: 2016-10-20 | Disposition: A | Discharge status: Home / Self Care | Payer: Medicare Other | Source: Ambulatory Visit | Attending: Nurse Practitioner | Admitting: Nurse Practitioner

## 2016-10-20 DIAGNOSIS — L97519 Non-pressure chronic ulcer of other part of right foot with unspecified severity: Secondary | ICD-10-CM | POA: Diagnosis not present

## 2016-10-20 DIAGNOSIS — L97312 Non-pressure chronic ulcer of right ankle with fat layer exposed: Secondary | ICD-10-CM

## 2016-10-20 IMAGING — CR DG FOOT COMPLETE 3+V*R*
3 series · 3 of 3 positions shown · non-contrast
Comparison: None.

CLINICAL DATA: Chronic right foot ulcer dorsal aspect at the base
of first metatarsal

EXAM:
RIGHT FOOT COMPLETE - 3+ VIEW

[t foot ap right]
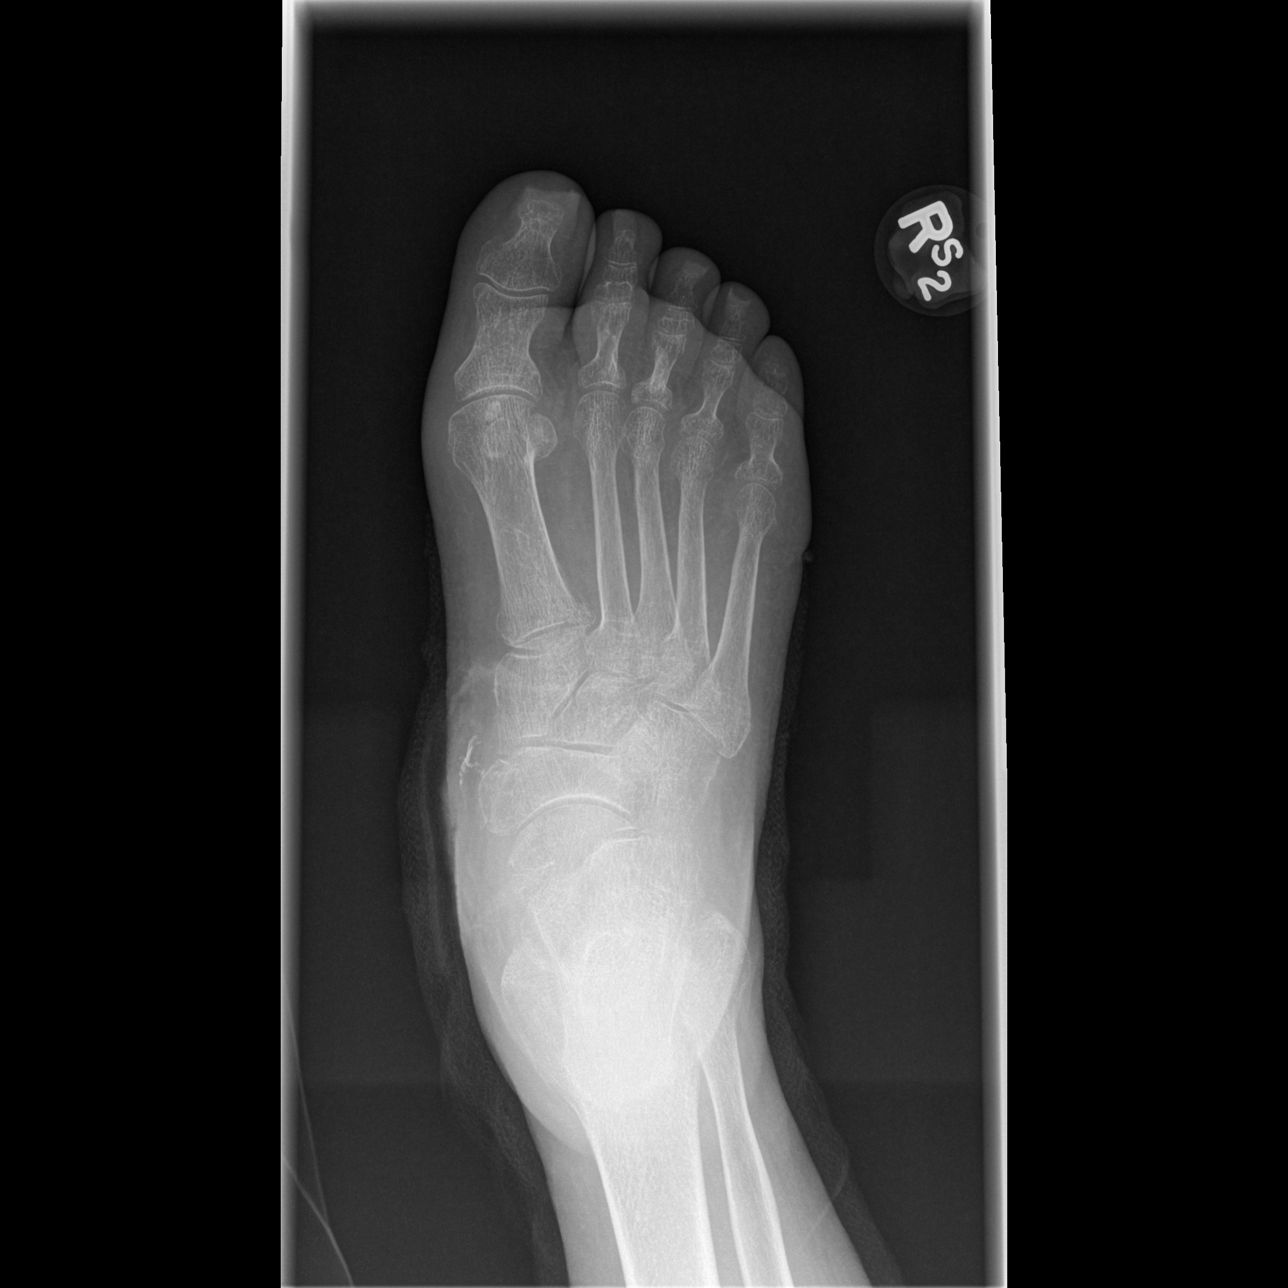

[t foot oblique right *]
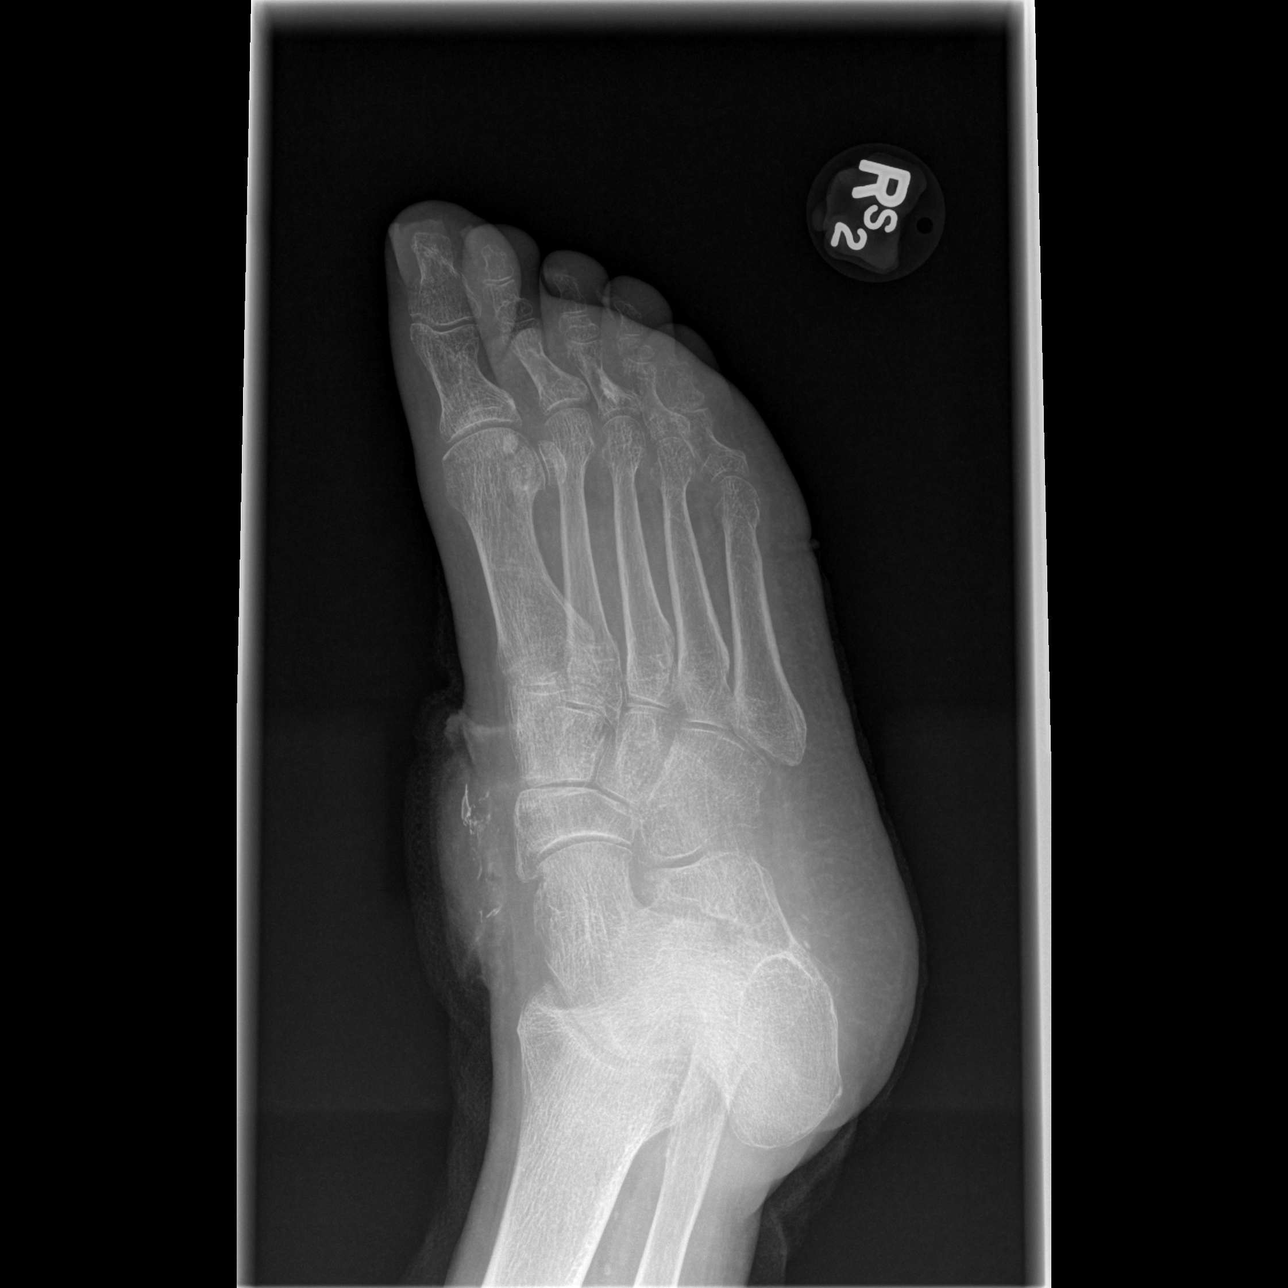

[t foot lat right]
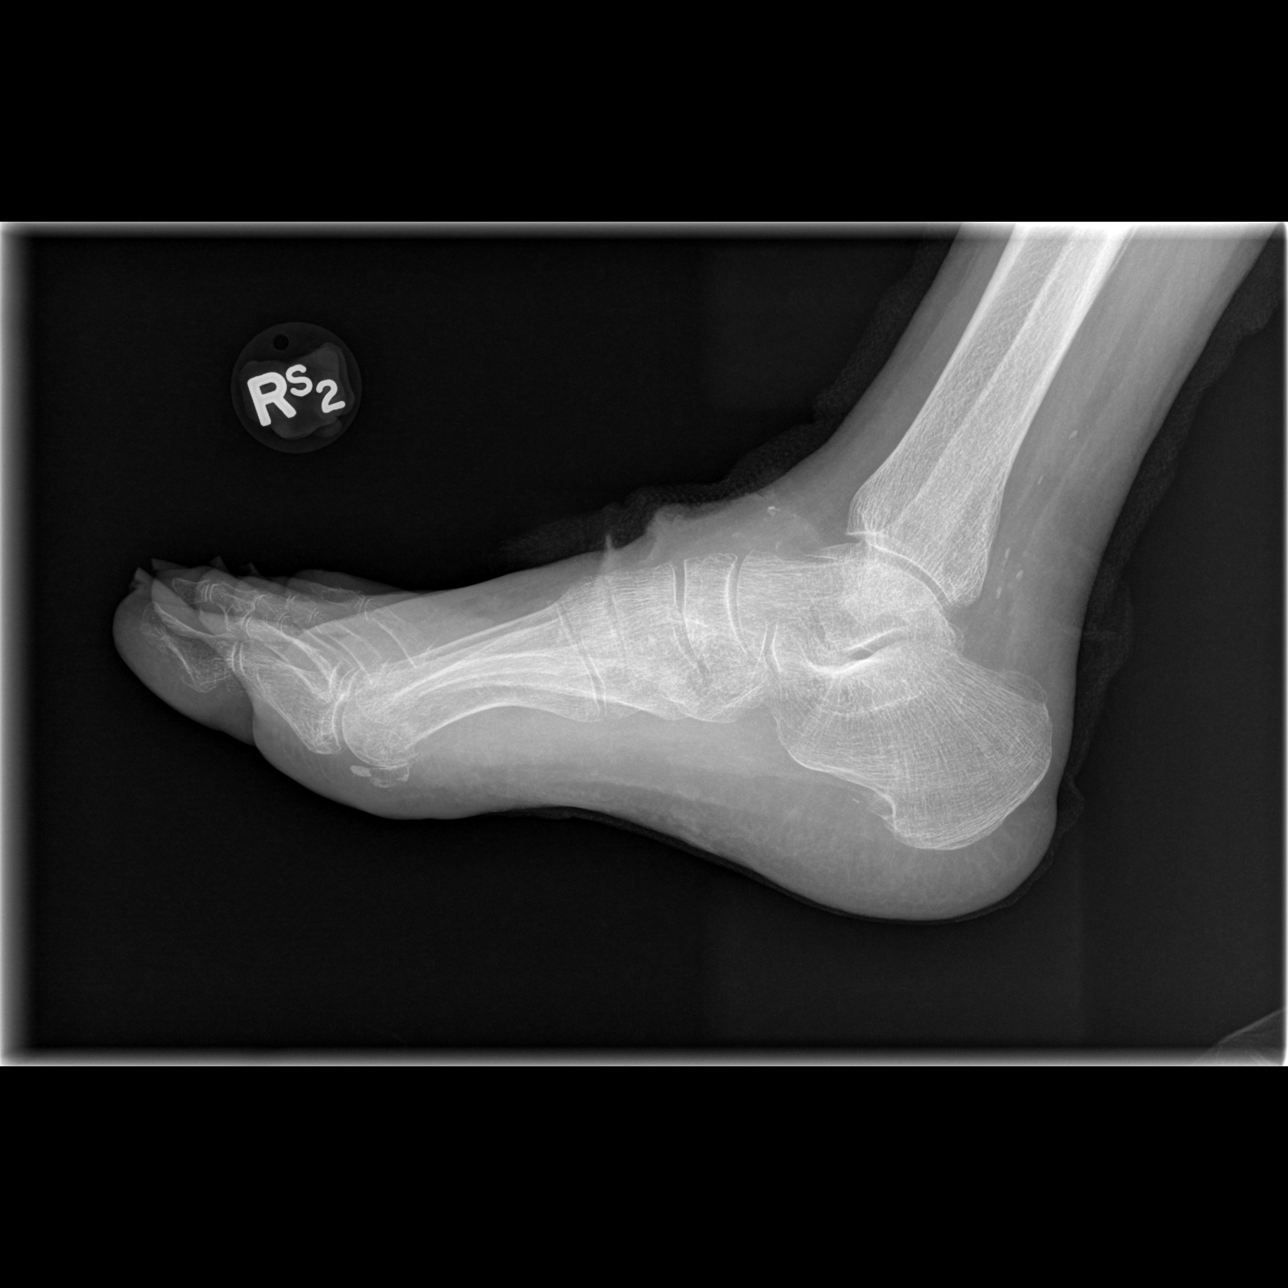

[3 of 3 positions shown; findings below may reference images not displayed]

FINDINGS: Three views of the right foot submitted. No acute fracture or
subluxation. There is soft tissue swelling and soft tissue
irregularity medial dorsal tarsal region probable soft tissue ulcer.
No definite evidence of bone destruction to suggest osteomyelitis.
There is diffuse osteopenia.
IMPRESSION: No acute fracture or subluxation. There is soft tissue swelling and
soft tissue irregularity medial dorsal tarsal region probable soft
tissue ulcer. No definite evidence of bone destruction to suggest
osteomyelitis.. Diffuse osteopenia is noted.

## 2016-10-22 ENCOUNTER — Encounter (HOSPITAL_BASED_OUTPATIENT_CLINIC_OR_DEPARTMENT_OTHER): Payer: Medicare Other | Attending: Internal Medicine

## 2016-10-22 DIAGNOSIS — Z9221 Personal history of antineoplastic chemotherapy: Secondary | ICD-10-CM | POA: Diagnosis not present

## 2016-10-22 DIAGNOSIS — L97312 Non-pressure chronic ulcer of right ankle with fat layer exposed: Secondary | ICD-10-CM | POA: Diagnosis not present

## 2016-10-22 DIAGNOSIS — L97518 Non-pressure chronic ulcer of other part of right foot with other specified severity: Secondary | ICD-10-CM | POA: Diagnosis not present

## 2016-10-22 DIAGNOSIS — L97221 Non-pressure chronic ulcer of left calf limited to breakdown of skin: Secondary | ICD-10-CM | POA: Insufficient documentation

## 2016-10-22 DIAGNOSIS — I10 Essential (primary) hypertension: Secondary | ICD-10-CM | POA: Diagnosis not present

## 2016-10-22 DIAGNOSIS — J449 Chronic obstructive pulmonary disease, unspecified: Secondary | ICD-10-CM | POA: Diagnosis not present

## 2016-10-22 DIAGNOSIS — E11622 Type 2 diabetes mellitus with other skin ulcer: Secondary | ICD-10-CM | POA: Insufficient documentation

## 2016-10-22 DIAGNOSIS — I739 Peripheral vascular disease, unspecified: Secondary | ICD-10-CM | POA: Insufficient documentation

## 2016-10-22 DIAGNOSIS — L97812 Non-pressure chronic ulcer of other part of right lower leg with fat layer exposed: Secondary | ICD-10-CM | POA: Diagnosis not present

## 2016-10-22 DIAGNOSIS — I2510 Atherosclerotic heart disease of native coronary artery without angina pectoris: Secondary | ICD-10-CM | POA: Diagnosis not present

## 2016-10-22 DIAGNOSIS — L97213 Non-pressure chronic ulcer of right calf with necrosis of muscle: Secondary | ICD-10-CM | POA: Insufficient documentation

## 2016-10-22 DIAGNOSIS — E1151 Type 2 diabetes mellitus with diabetic peripheral angiopathy without gangrene: Secondary | ICD-10-CM | POA: Insufficient documentation

## 2016-10-26 ENCOUNTER — Encounter: Payer: Medicare Other | Admitting: Surgery

## 2016-10-27 DIAGNOSIS — E11622 Type 2 diabetes mellitus with other skin ulcer: Secondary | ICD-10-CM | POA: Diagnosis not present

## 2016-10-27 DIAGNOSIS — L97812 Non-pressure chronic ulcer of other part of right lower leg with fat layer exposed: Secondary | ICD-10-CM | POA: Diagnosis not present

## 2016-10-27 DIAGNOSIS — L97312 Non-pressure chronic ulcer of right ankle with fat layer exposed: Secondary | ICD-10-CM | POA: Diagnosis not present

## 2016-10-27 DIAGNOSIS — Z4800 Encounter for change or removal of nonsurgical wound dressing: Secondary | ICD-10-CM | POA: Diagnosis not present

## 2016-10-27 DIAGNOSIS — E1151 Type 2 diabetes mellitus with diabetic peripheral angiopathy without gangrene: Secondary | ICD-10-CM | POA: Diagnosis not present

## 2016-10-27 DIAGNOSIS — I743 Embolism and thrombosis of arteries of the lower extremities: Secondary | ICD-10-CM | POA: Diagnosis not present

## 2016-10-29 DIAGNOSIS — L97312 Non-pressure chronic ulcer of right ankle with fat layer exposed: Secondary | ICD-10-CM | POA: Diagnosis not present

## 2016-10-29 DIAGNOSIS — L97518 Non-pressure chronic ulcer of other part of right foot with other specified severity: Secondary | ICD-10-CM | POA: Diagnosis not present

## 2016-10-29 DIAGNOSIS — L97812 Non-pressure chronic ulcer of other part of right lower leg with fat layer exposed: Secondary | ICD-10-CM | POA: Diagnosis not present

## 2016-10-29 DIAGNOSIS — L97213 Non-pressure chronic ulcer of right calf with necrosis of muscle: Secondary | ICD-10-CM | POA: Diagnosis not present

## 2016-10-29 DIAGNOSIS — E11622 Type 2 diabetes mellitus with other skin ulcer: Secondary | ICD-10-CM | POA: Diagnosis not present

## 2016-10-29 DIAGNOSIS — J449 Chronic obstructive pulmonary disease, unspecified: Secondary | ICD-10-CM | POA: Diagnosis not present

## 2016-10-29 DIAGNOSIS — E1151 Type 2 diabetes mellitus with diabetic peripheral angiopathy without gangrene: Secondary | ICD-10-CM | POA: Diagnosis not present

## 2016-10-29 DIAGNOSIS — L97221 Non-pressure chronic ulcer of left calf limited to breakdown of skin: Secondary | ICD-10-CM | POA: Diagnosis not present

## 2016-11-05 DIAGNOSIS — E11622 Type 2 diabetes mellitus with other skin ulcer: Secondary | ICD-10-CM | POA: Diagnosis not present

## 2016-11-05 DIAGNOSIS — J449 Chronic obstructive pulmonary disease, unspecified: Secondary | ICD-10-CM | POA: Diagnosis not present

## 2016-11-05 DIAGNOSIS — L97221 Non-pressure chronic ulcer of left calf limited to breakdown of skin: Secondary | ICD-10-CM | POA: Diagnosis not present

## 2016-11-05 DIAGNOSIS — L97312 Non-pressure chronic ulcer of right ankle with fat layer exposed: Secondary | ICD-10-CM | POA: Diagnosis not present

## 2016-11-05 DIAGNOSIS — E1151 Type 2 diabetes mellitus with diabetic peripheral angiopathy without gangrene: Secondary | ICD-10-CM | POA: Diagnosis not present

## 2016-11-05 DIAGNOSIS — L97512 Non-pressure chronic ulcer of other part of right foot with fat layer exposed: Secondary | ICD-10-CM | POA: Diagnosis not present

## 2016-11-05 DIAGNOSIS — L97812 Non-pressure chronic ulcer of other part of right lower leg with fat layer exposed: Secondary | ICD-10-CM | POA: Diagnosis not present

## 2016-11-05 DIAGNOSIS — L97213 Non-pressure chronic ulcer of right calf with necrosis of muscle: Secondary | ICD-10-CM | POA: Diagnosis not present

## 2016-11-06 DIAGNOSIS — F17210 Nicotine dependence, cigarettes, uncomplicated: Secondary | ICD-10-CM | POA: Diagnosis not present

## 2016-11-06 DIAGNOSIS — D510 Vitamin B12 deficiency anemia due to intrinsic factor deficiency: Secondary | ICD-10-CM | POA: Diagnosis not present

## 2016-11-06 DIAGNOSIS — E1151 Type 2 diabetes mellitus with diabetic peripheral angiopathy without gangrene: Secondary | ICD-10-CM | POA: Diagnosis not present

## 2016-11-06 DIAGNOSIS — E7800 Pure hypercholesterolemia, unspecified: Secondary | ICD-10-CM | POA: Diagnosis not present

## 2016-11-06 DIAGNOSIS — I739 Peripheral vascular disease, unspecified: Secondary | ICD-10-CM | POA: Diagnosis not present

## 2016-11-06 DIAGNOSIS — J449 Chronic obstructive pulmonary disease, unspecified: Secondary | ICD-10-CM | POA: Diagnosis not present

## 2016-11-06 DIAGNOSIS — I10 Essential (primary) hypertension: Secondary | ICD-10-CM | POA: Diagnosis not present

## 2016-11-06 DIAGNOSIS — E11319 Type 2 diabetes mellitus with unspecified diabetic retinopathy without macular edema: Secondary | ICD-10-CM | POA: Diagnosis not present

## 2016-11-06 DIAGNOSIS — Z7984 Long term (current) use of oral hypoglycemic drugs: Secondary | ICD-10-CM | POA: Diagnosis not present

## 2016-11-09 DIAGNOSIS — E11622 Type 2 diabetes mellitus with other skin ulcer: Secondary | ICD-10-CM | POA: Diagnosis not present

## 2016-11-09 DIAGNOSIS — Z4800 Encounter for change or removal of nonsurgical wound dressing: Secondary | ICD-10-CM | POA: Diagnosis not present

## 2016-11-09 DIAGNOSIS — E1151 Type 2 diabetes mellitus with diabetic peripheral angiopathy without gangrene: Secondary | ICD-10-CM | POA: Diagnosis not present

## 2016-11-09 DIAGNOSIS — L97312 Non-pressure chronic ulcer of right ankle with fat layer exposed: Secondary | ICD-10-CM | POA: Diagnosis not present

## 2016-11-09 DIAGNOSIS — L97812 Non-pressure chronic ulcer of other part of right lower leg with fat layer exposed: Secondary | ICD-10-CM | POA: Diagnosis not present

## 2016-11-09 DIAGNOSIS — I743 Embolism and thrombosis of arteries of the lower extremities: Secondary | ICD-10-CM | POA: Diagnosis not present

## 2016-11-12 DIAGNOSIS — L97512 Non-pressure chronic ulcer of other part of right foot with fat layer exposed: Secondary | ICD-10-CM | POA: Diagnosis not present

## 2016-11-12 DIAGNOSIS — J449 Chronic obstructive pulmonary disease, unspecified: Secondary | ICD-10-CM | POA: Diagnosis not present

## 2016-11-12 DIAGNOSIS — E11622 Type 2 diabetes mellitus with other skin ulcer: Secondary | ICD-10-CM | POA: Diagnosis not present

## 2016-11-12 DIAGNOSIS — L97312 Non-pressure chronic ulcer of right ankle with fat layer exposed: Secondary | ICD-10-CM | POA: Diagnosis not present

## 2016-11-12 DIAGNOSIS — E1151 Type 2 diabetes mellitus with diabetic peripheral angiopathy without gangrene: Secondary | ICD-10-CM | POA: Diagnosis not present

## 2016-11-12 DIAGNOSIS — L97812 Non-pressure chronic ulcer of other part of right lower leg with fat layer exposed: Secondary | ICD-10-CM | POA: Diagnosis not present

## 2016-11-12 DIAGNOSIS — L97213 Non-pressure chronic ulcer of right calf with necrosis of muscle: Secondary | ICD-10-CM | POA: Diagnosis not present

## 2016-11-12 DIAGNOSIS — L97221 Non-pressure chronic ulcer of left calf limited to breakdown of skin: Secondary | ICD-10-CM | POA: Diagnosis not present

## 2016-11-13 DIAGNOSIS — E1151 Type 2 diabetes mellitus with diabetic peripheral angiopathy without gangrene: Secondary | ICD-10-CM | POA: Diagnosis not present

## 2016-11-13 DIAGNOSIS — E11622 Type 2 diabetes mellitus with other skin ulcer: Secondary | ICD-10-CM | POA: Diagnosis not present

## 2016-11-13 DIAGNOSIS — Z4800 Encounter for change or removal of nonsurgical wound dressing: Secondary | ICD-10-CM | POA: Diagnosis not present

## 2016-11-13 DIAGNOSIS — I743 Embolism and thrombosis of arteries of the lower extremities: Secondary | ICD-10-CM | POA: Diagnosis not present

## 2016-11-13 DIAGNOSIS — L97812 Non-pressure chronic ulcer of other part of right lower leg with fat layer exposed: Secondary | ICD-10-CM | POA: Diagnosis not present

## 2016-11-13 DIAGNOSIS — L97312 Non-pressure chronic ulcer of right ankle with fat layer exposed: Secondary | ICD-10-CM | POA: Diagnosis not present

## 2016-11-16 DIAGNOSIS — Z4800 Encounter for change or removal of nonsurgical wound dressing: Secondary | ICD-10-CM | POA: Diagnosis not present

## 2016-11-16 DIAGNOSIS — E11622 Type 2 diabetes mellitus with other skin ulcer: Secondary | ICD-10-CM | POA: Diagnosis not present

## 2016-11-16 DIAGNOSIS — E1151 Type 2 diabetes mellitus with diabetic peripheral angiopathy without gangrene: Secondary | ICD-10-CM | POA: Diagnosis not present

## 2016-11-16 DIAGNOSIS — L97312 Non-pressure chronic ulcer of right ankle with fat layer exposed: Secondary | ICD-10-CM | POA: Diagnosis not present

## 2016-11-16 DIAGNOSIS — L97812 Non-pressure chronic ulcer of other part of right lower leg with fat layer exposed: Secondary | ICD-10-CM | POA: Diagnosis not present

## 2016-11-16 DIAGNOSIS — Z7982 Long term (current) use of aspirin: Secondary | ICD-10-CM | POA: Diagnosis not present

## 2016-11-16 DIAGNOSIS — Z7984 Long term (current) use of oral hypoglycemic drugs: Secondary | ICD-10-CM | POA: Diagnosis not present

## 2016-11-16 DIAGNOSIS — I743 Embolism and thrombosis of arteries of the lower extremities: Secondary | ICD-10-CM | POA: Diagnosis not present

## 2016-11-16 DIAGNOSIS — Z7902 Long term (current) use of antithrombotics/antiplatelets: Secondary | ICD-10-CM | POA: Diagnosis not present

## 2016-11-18 ENCOUNTER — Encounter: Payer: Self-pay | Admitting: Surgery

## 2016-11-19 DIAGNOSIS — L97515 Non-pressure chronic ulcer of other part of right foot with muscle involvement without evidence of necrosis: Secondary | ICD-10-CM | POA: Diagnosis not present

## 2016-11-19 DIAGNOSIS — L97213 Non-pressure chronic ulcer of right calf with necrosis of muscle: Secondary | ICD-10-CM | POA: Diagnosis not present

## 2016-11-19 DIAGNOSIS — L97221 Non-pressure chronic ulcer of left calf limited to breakdown of skin: Secondary | ICD-10-CM | POA: Diagnosis not present

## 2016-11-19 DIAGNOSIS — L97812 Non-pressure chronic ulcer of other part of right lower leg with fat layer exposed: Secondary | ICD-10-CM | POA: Diagnosis not present

## 2016-11-19 DIAGNOSIS — L97312 Non-pressure chronic ulcer of right ankle with fat layer exposed: Secondary | ICD-10-CM | POA: Diagnosis not present

## 2016-11-19 DIAGNOSIS — E11622 Type 2 diabetes mellitus with other skin ulcer: Secondary | ICD-10-CM | POA: Diagnosis not present

## 2016-11-19 DIAGNOSIS — E1151 Type 2 diabetes mellitus with diabetic peripheral angiopathy without gangrene: Secondary | ICD-10-CM | POA: Diagnosis not present

## 2016-11-19 DIAGNOSIS — J449 Chronic obstructive pulmonary disease, unspecified: Secondary | ICD-10-CM | POA: Diagnosis not present

## 2016-11-23 ENCOUNTER — Ambulatory Visit (INDEPENDENT_AMBULATORY_CARE_PROVIDER_SITE_OTHER)
Admission: RE | Admit: 2016-11-23 | Discharge: 2016-11-23 | Disposition: A | Discharge status: Home / Self Care | Payer: Medicare Other | Source: Ambulatory Visit | Attending: Surgery | Admitting: Surgery

## 2016-11-23 ENCOUNTER — Ambulatory Visit (HOSPITAL_COMMUNITY)
Admission: RE | Admit: 2016-11-23 | Discharge: 2016-11-23 | Disposition: A | Discharge status: Home / Self Care | Payer: Medicare Other | Source: Ambulatory Visit | Attending: Surgery | Admitting: Surgery

## 2016-11-23 DIAGNOSIS — I739 Peripheral vascular disease, unspecified: Secondary | ICD-10-CM | POA: Diagnosis not present

## 2016-11-23 DIAGNOSIS — Z9862 Peripheral vascular angioplasty status: Secondary | ICD-10-CM | POA: Diagnosis not present

## 2016-11-23 DIAGNOSIS — Z95820 Peripheral vascular angioplasty status with implants and grafts: Secondary | ICD-10-CM | POA: Diagnosis not present

## 2016-11-24 DIAGNOSIS — L97812 Non-pressure chronic ulcer of other part of right lower leg with fat layer exposed: Secondary | ICD-10-CM | POA: Diagnosis not present

## 2016-11-24 DIAGNOSIS — Z4800 Encounter for change or removal of nonsurgical wound dressing: Secondary | ICD-10-CM | POA: Diagnosis not present

## 2016-11-24 DIAGNOSIS — L97312 Non-pressure chronic ulcer of right ankle with fat layer exposed: Secondary | ICD-10-CM | POA: Diagnosis not present

## 2016-11-24 DIAGNOSIS — E11622 Type 2 diabetes mellitus with other skin ulcer: Secondary | ICD-10-CM | POA: Diagnosis not present

## 2016-11-24 DIAGNOSIS — I743 Embolism and thrombosis of arteries of the lower extremities: Secondary | ICD-10-CM | POA: Diagnosis not present

## 2016-11-24 DIAGNOSIS — E1151 Type 2 diabetes mellitus with diabetic peripheral angiopathy without gangrene: Secondary | ICD-10-CM | POA: Diagnosis not present

## 2016-11-26 ENCOUNTER — Encounter (HOSPITAL_BASED_OUTPATIENT_CLINIC_OR_DEPARTMENT_OTHER): Payer: Medicare Other | Attending: Internal Medicine

## 2016-11-26 ENCOUNTER — Other Ambulatory Visit (HOSPITAL_COMMUNITY)
Admit: 2016-11-26 | Discharge: 2016-11-26 | Disposition: A | Discharge status: Home / Self Care | Payer: Medicare Other | Source: Ambulatory Visit | Attending: Internal Medicine | Admitting: Internal Medicine

## 2016-11-26 DIAGNOSIS — E1151 Type 2 diabetes mellitus with diabetic peripheral angiopathy without gangrene: Secondary | ICD-10-CM | POA: Diagnosis not present

## 2016-11-26 DIAGNOSIS — F17210 Nicotine dependence, cigarettes, uncomplicated: Secondary | ICD-10-CM | POA: Insufficient documentation

## 2016-11-26 DIAGNOSIS — I739 Peripheral vascular disease, unspecified: Secondary | ICD-10-CM | POA: Diagnosis not present

## 2016-11-26 DIAGNOSIS — L97213 Non-pressure chronic ulcer of right calf with necrosis of muscle: Secondary | ICD-10-CM | POA: Insufficient documentation

## 2016-11-26 DIAGNOSIS — L97221 Non-pressure chronic ulcer of left calf limited to breakdown of skin: Secondary | ICD-10-CM | POA: Insufficient documentation

## 2016-11-26 DIAGNOSIS — L97512 Non-pressure chronic ulcer of other part of right foot with fat layer exposed: Secondary | ICD-10-CM | POA: Diagnosis not present

## 2016-11-26 DIAGNOSIS — E11622 Type 2 diabetes mellitus with other skin ulcer: Secondary | ICD-10-CM | POA: Insufficient documentation

## 2016-11-26 DIAGNOSIS — I10 Essential (primary) hypertension: Secondary | ICD-10-CM | POA: Insufficient documentation

## 2016-11-26 DIAGNOSIS — L97312 Non-pressure chronic ulcer of right ankle with fat layer exposed: Secondary | ICD-10-CM | POA: Insufficient documentation

## 2016-11-26 DIAGNOSIS — I2510 Atherosclerotic heart disease of native coronary artery without angina pectoris: Secondary | ICD-10-CM | POA: Insufficient documentation

## 2016-11-26 DIAGNOSIS — Z9221 Personal history of antineoplastic chemotherapy: Secondary | ICD-10-CM | POA: Diagnosis not present

## 2016-11-26 DIAGNOSIS — J449 Chronic obstructive pulmonary disease, unspecified: Secondary | ICD-10-CM | POA: Diagnosis not present

## 2016-11-26 DIAGNOSIS — Z951 Presence of aortocoronary bypass graft: Secondary | ICD-10-CM | POA: Insufficient documentation

## 2016-11-26 DIAGNOSIS — E11621 Type 2 diabetes mellitus with foot ulcer: Secondary | ICD-10-CM | POA: Insufficient documentation

## 2016-11-27 DIAGNOSIS — Z4800 Encounter for change or removal of nonsurgical wound dressing: Secondary | ICD-10-CM | POA: Diagnosis not present

## 2016-11-27 DIAGNOSIS — E1151 Type 2 diabetes mellitus with diabetic peripheral angiopathy without gangrene: Secondary | ICD-10-CM | POA: Diagnosis not present

## 2016-11-27 DIAGNOSIS — E11622 Type 2 diabetes mellitus with other skin ulcer: Secondary | ICD-10-CM | POA: Diagnosis not present

## 2016-11-27 DIAGNOSIS — L97312 Non-pressure chronic ulcer of right ankle with fat layer exposed: Secondary | ICD-10-CM | POA: Diagnosis not present

## 2016-11-27 DIAGNOSIS — L97812 Non-pressure chronic ulcer of other part of right lower leg with fat layer exposed: Secondary | ICD-10-CM | POA: Diagnosis not present

## 2016-11-27 DIAGNOSIS — I743 Embolism and thrombosis of arteries of the lower extremities: Secondary | ICD-10-CM | POA: Diagnosis not present

## 2016-11-28 DIAGNOSIS — L97812 Non-pressure chronic ulcer of other part of right lower leg with fat layer exposed: Secondary | ICD-10-CM | POA: Diagnosis not present

## 2016-11-28 DIAGNOSIS — E11622 Type 2 diabetes mellitus with other skin ulcer: Secondary | ICD-10-CM | POA: Diagnosis not present

## 2016-11-28 DIAGNOSIS — E1151 Type 2 diabetes mellitus with diabetic peripheral angiopathy without gangrene: Secondary | ICD-10-CM | POA: Diagnosis not present

## 2016-11-28 DIAGNOSIS — L97312 Non-pressure chronic ulcer of right ankle with fat layer exposed: Secondary | ICD-10-CM | POA: Diagnosis not present

## 2016-11-28 DIAGNOSIS — I743 Embolism and thrombosis of arteries of the lower extremities: Secondary | ICD-10-CM | POA: Diagnosis not present

## 2016-11-28 DIAGNOSIS — Z4800 Encounter for change or removal of nonsurgical wound dressing: Secondary | ICD-10-CM | POA: Diagnosis not present

## 2016-11-30 ENCOUNTER — Ambulatory Visit (INDEPENDENT_AMBULATORY_CARE_PROVIDER_SITE_OTHER): Payer: Self-pay | Admitting: Surgery

## 2016-11-30 ENCOUNTER — Encounter: Payer: Self-pay | Admitting: Surgery

## 2016-11-30 ENCOUNTER — Other Ambulatory Visit: Payer: Self-pay

## 2016-11-30 VITALS — BP 143/73 | HR 91 | Temp 97.3°F | Resp 21 | Ht 61.0 in | Wt 127.8 lb

## 2016-11-30 DIAGNOSIS — I70233 Atherosclerosis of native arteries of right leg with ulceration of ankle: Secondary | ICD-10-CM

## 2016-11-30 LAB — AEROBIC CULTURE  (SUPERFICIAL SPECIMEN)

## 2016-11-30 LAB — AEROBIC CULTURE W GRAM STAIN (SUPERFICIAL SPECIMEN)

## 2016-11-30 NOTE — Progress Notes (Signed)
POST OPERATIVE OFFICE NOTE    CC:  F/u for surgery  HPI:  This is a 65 y.o. male who is s/p  Procedure Performed:             1.  Ultrasound-guided access, left femoral artery             2.  Abdominal aortogram             3.  Right lower extremity runoff             4.  Stent, right superficial femoral/popliteal artery             5.  Failed balloon angioplasty, right posterior tibial artery He states his right foot feels warmer, but he is concerned about the wounds not healing.  He goes to the wound center 3 times a week and has a HH RN 2 times a week.     Reginald Ayers is a 65 y.o. male, who is Referred to me for a second opinion regarding ulcers on his right leg.  He states they have been present for approximately 8 months.  He has a duplex of the right leg would suggest popliteal occlusion as well as tibial vessel disease.  His most recent ABI was 0.47 on the right.  The patient currently is getting wound care 3 times a week with [facility].  The patient denies any real claudication symptoms.  The patient has a history of carotid occlusive disease.  He has a chronic occlusion of the left internal carotid artery and 60-79% right carotid stenosis.  The last imaging study was one year ago.  He has a history of smoking.  Allergies  Allergen Reactions  . Actos [Pioglitazone] Shortness Of Breath    Leg swelling   . Lisinopril Cough    Current Outpatient Prescriptions  Medication Sig Dispense Refill  . albuterol (PROVENTIL HFA;VENTOLIN HFA) 108 (90 Base) MCG/ACT inhaler Inhale 2 puffs into the lungs every 4 (four) hours as needed for wheezing or shortness of breath.     . albuterol (PROVENTIL) (2.5 MG/3ML) 0.083% nebulizer solution Inhale 3 mLs into the lungs 2 (two) times daily as needed for shortness of breath.  1  . aspirin EC 81 MG tablet Take 81 mg by mouth daily.    . clopidogrel (PLAVIX) 75 MG tablet Take 75 mg by mouth daily.     . Cyanocobalamin (VITAMIN B-12  IJ) Inject 1 application as directed every 30 (thirty) days.     . dextromethorphan-guaiFENesin (MUCINEX DM) 30-600 MG 12hr tablet Take 1 tablet by mouth 2 (two) times daily as needed for cough.    . furosemide (LASIX) 40 MG tablet Take 40 mg by mouth daily as needed for edema.    . glipiZIDE (GLUCOTROL XL) 10 MG 24 hr tablet Take 10 mg by mouth 2 (two) times daily.     . Homeopathic Products (LEG CRAMP RELIEF SL) Place 2 tablets under the tongue 2 (two) times daily as needed (leg cramps).    . ibuprofen (ADVIL,MOTRIN) 200 MG tablet Take 800 mg by mouth every 8 (eight) hours as needed for moderate pain.    . irbesartan (AVAPRO) 300 MG tablet Take 300 mg by mouth daily.     . metFORMIN (GLUCOPHAGE) 500 MG tablet Take 1,000 mg by mouth 2 (two) times daily with a meal.     . Multiple Vitamin (MULTIVITAMIN WITH MINERALS) TABS tablet Take 1 tablet by mouth daily.    .   NITROSTAT 0.4 MG SL tablet Take 0.4 mg by mouth every 5 (five) minutes as needed for chest pain.   0  . Polyvinyl Alcohol-Povidone (REFRESH OP) Apply 1 drop to eye 2 (two) times daily as needed (dry eyes).    . Pseudoeph-Doxylamine-DM-APAP (NYQUIL PO) Take 1 drop by mouth at bedtime.    . ranitidine (ZANTAC) 150 MG tablet Take 150 mg by mouth daily.    . simvastatin (ZOCOR) 40 MG tablet Take 40 mg by mouth every evening.    . SPIRIVA HANDIHALER 18 MCG inhalation capsule Place 18 mcg into inhaler and inhale daily.     . traMADol (ULTRAM) 50 MG tablet Take 50 mg by mouth 3 (three) times daily as needed for severe pain.     . vitamin B-12 (CYANOCOBALAMIN) 1000 MCG tablet Take 1,000 mcg by mouth daily as needed (tingling).     No current facility-administered medications for this visit.      ROS:  See HPI  Physical Exam:  Vitals:   11/30/16 1327 11/30/16 1328  BP: (!) 147/79 (!) 143/73  Pulse: 91   Resp: (!) 21   Temp: 97.3 F (36.3 C)     Incision:  Left common femoral stick site is well healed.   Right anterior shin wound 1  MM depth 4 X 2 cm Right dorsum of the foot wound 3cm circumference 1 MM depth with areas of yellow necrosis and red granulation tissue. Extremities:  Left LE pitting edema > right, Palpable femoral pulses Neuro: grossly intact sensation B  ABI: Right 0.73 PT biphasic flow, DP monophasic flow Left 0.38 Monophasic flow  Arterial Duplex Elevated distal SFA and proximal popliteal suggest 1-49% stenosis on the right.  Proximal stent 231 cm/s Biphasic flow, distal stent 194 cm/s monophasic,  Angiogram findings: Right Lower Extremity:  Right common femoral and profunda femoral artery are widely patent.  The origin of the right superficial femoral artery has approximately a 60-70 percent stenosis.  There is a stent within the midportion of the superficial femoral artery which is occluded.  There is reconstitution beyond the stent.  The popliteal artery is patent throughout it's course.  The dominant vessel to the ankle is the peroneal artery.  There is reconstitution of the posterior tibial.  Posterior tibial artery does appear to be patent however there is a high-grade stenosis versus short segment occlusion in its midportion.     Assessment/Plan:  This is a 65 y.o. male who is s/p: Right LE angiogram with intervention.  Impression:            #1  occluded right superficial femoral artery stent which was successfully crossed.  I inserted a 6 x 2 50 Viabahn covered stent to treat the superficial femoral and popliteal artery lesions.  The proximal superficial femoral artery was treated with a 7 x 60 Innova            #2  severe stenosis versus occlusion of the posterior tibial artery which is the dominant vessel across the foot.  I attempted to cross this lesion but was unsuccessful.  If the patient is unable to heal his wound I will bring him back for a repeat attempt from the left groin.   His wounds are not healing well.  Dr. Athalie Newhard has decided to schedule him for another angiogram to try and  improve the PT flow on 4/ 17/2018.  The patient agrees to this plan.   He is on Plavix and aspirin daily.  COLLINS,   EMMA [facility] PA-C [facility] [phone]  The patient was seen in conjunction with Dr. Myra Gianotti today.  The patient's right leg ulcers have slightly improved but are still present.  At his last arteriogram I was able to open up an occluded stent.  He had residual disease in his tibial vessels which I elected not to address at that time.  Because he has not had wound healing to my satisfaction, think we need to go back and proceed with tibial intervention.  I will get extra imaging of his peroneal artery but focus most efforts on his posterior tibial disease.  This is been scheduled for April 17.  It would be a left groin access with right leg intervention.   [name]

## 2016-12-01 DIAGNOSIS — L97312 Non-pressure chronic ulcer of right ankle with fat layer exposed: Secondary | ICD-10-CM | POA: Diagnosis not present

## 2016-12-01 DIAGNOSIS — L97812 Non-pressure chronic ulcer of other part of right lower leg with fat layer exposed: Secondary | ICD-10-CM | POA: Diagnosis not present

## 2016-12-01 DIAGNOSIS — E11622 Type 2 diabetes mellitus with other skin ulcer: Secondary | ICD-10-CM | POA: Diagnosis not present

## 2016-12-01 DIAGNOSIS — Z4800 Encounter for change or removal of nonsurgical wound dressing: Secondary | ICD-10-CM | POA: Diagnosis not present

## 2016-12-01 DIAGNOSIS — E1151 Type 2 diabetes mellitus with diabetic peripheral angiopathy without gangrene: Secondary | ICD-10-CM | POA: Diagnosis not present

## 2016-12-01 DIAGNOSIS — I743 Embolism and thrombosis of arteries of the lower extremities: Secondary | ICD-10-CM | POA: Diagnosis not present

## 2016-12-03 DIAGNOSIS — L97512 Non-pressure chronic ulcer of other part of right foot with fat layer exposed: Secondary | ICD-10-CM | POA: Diagnosis not present

## 2016-12-03 DIAGNOSIS — E11622 Type 2 diabetes mellitus with other skin ulcer: Secondary | ICD-10-CM | POA: Diagnosis not present

## 2016-12-03 DIAGNOSIS — L97812 Non-pressure chronic ulcer of other part of right lower leg with fat layer exposed: Secondary | ICD-10-CM | POA: Diagnosis not present

## 2016-12-03 DIAGNOSIS — E1151 Type 2 diabetes mellitus with diabetic peripheral angiopathy without gangrene: Secondary | ICD-10-CM | POA: Diagnosis not present

## 2016-12-03 DIAGNOSIS — E11621 Type 2 diabetes mellitus with foot ulcer: Secondary | ICD-10-CM | POA: Diagnosis not present

## 2016-12-03 DIAGNOSIS — L97213 Non-pressure chronic ulcer of right calf with necrosis of muscle: Secondary | ICD-10-CM | POA: Diagnosis not present

## 2016-12-03 DIAGNOSIS — L97221 Non-pressure chronic ulcer of left calf limited to breakdown of skin: Secondary | ICD-10-CM | POA: Diagnosis not present

## 2016-12-03 DIAGNOSIS — L97312 Non-pressure chronic ulcer of right ankle with fat layer exposed: Secondary | ICD-10-CM | POA: Diagnosis not present

## 2016-12-07 DIAGNOSIS — E1151 Type 2 diabetes mellitus with diabetic peripheral angiopathy without gangrene: Secondary | ICD-10-CM | POA: Diagnosis not present

## 2016-12-07 DIAGNOSIS — E11622 Type 2 diabetes mellitus with other skin ulcer: Secondary | ICD-10-CM | POA: Diagnosis not present

## 2016-12-07 DIAGNOSIS — L97812 Non-pressure chronic ulcer of other part of right lower leg with fat layer exposed: Secondary | ICD-10-CM | POA: Diagnosis not present

## 2016-12-07 DIAGNOSIS — L97312 Non-pressure chronic ulcer of right ankle with fat layer exposed: Secondary | ICD-10-CM | POA: Diagnosis not present

## 2016-12-07 DIAGNOSIS — Z4800 Encounter for change or removal of nonsurgical wound dressing: Secondary | ICD-10-CM | POA: Diagnosis not present

## 2016-12-07 DIAGNOSIS — I743 Embolism and thrombosis of arteries of the lower extremities: Secondary | ICD-10-CM | POA: Diagnosis not present

## 2016-12-08 ENCOUNTER — Ambulatory Visit (HOSPITAL_COMMUNITY)
Admission: RE | Admit: 2016-12-08 | Discharge: 2016-12-08 | Disposition: A | Discharge status: Home / Self Care | Payer: Medicare Other | Source: Ambulatory Visit | Attending: Surgery | Admitting: Surgery

## 2016-12-08 ENCOUNTER — Ambulatory Visit (HOSPITAL_COMMUNITY)
Admission: RE | Disposition: A | Discharge status: Home / Self Care | Payer: Self-pay | Source: Ambulatory Visit | Attending: Surgery

## 2016-12-08 ENCOUNTER — Telehealth: Payer: Self-pay | Admitting: Surgery

## 2016-12-08 DIAGNOSIS — Z7902 Long term (current) use of antithrombotics/antiplatelets: Secondary | ICD-10-CM | POA: Insufficient documentation

## 2016-12-08 DIAGNOSIS — I70248 Atherosclerosis of native arteries of left leg with ulceration of other part of lower left leg: Secondary | ICD-10-CM | POA: Insufficient documentation

## 2016-12-08 DIAGNOSIS — I70235 Atherosclerosis of native arteries of right leg with ulceration of other part of foot: Secondary | ICD-10-CM | POA: Diagnosis not present

## 2016-12-08 DIAGNOSIS — I739 Peripheral vascular disease, unspecified: Secondary | ICD-10-CM | POA: Diagnosis present

## 2016-12-08 DIAGNOSIS — Z7982 Long term (current) use of aspirin: Secondary | ICD-10-CM | POA: Diagnosis not present

## 2016-12-08 DIAGNOSIS — Z7984 Long term (current) use of oral hypoglycemic drugs: Secondary | ICD-10-CM | POA: Insufficient documentation

## 2016-12-08 DIAGNOSIS — Z87891 Personal history of nicotine dependence: Secondary | ICD-10-CM | POA: Insufficient documentation

## 2016-12-08 DIAGNOSIS — Z8249 Family history of ischemic heart disease and other diseases of the circulatory system: Secondary | ICD-10-CM | POA: Insufficient documentation

## 2016-12-08 DIAGNOSIS — I70233 Atherosclerosis of native arteries of right leg with ulceration of ankle: Secondary | ICD-10-CM | POA: Diagnosis not present

## 2016-12-08 DIAGNOSIS — L97519 Non-pressure chronic ulcer of other part of right foot with unspecified severity: Secondary | ICD-10-CM | POA: Insufficient documentation

## 2016-12-08 DIAGNOSIS — L97829 Non-pressure chronic ulcer of other part of left lower leg with unspecified severity: Secondary | ICD-10-CM | POA: Diagnosis not present

## 2016-12-08 DIAGNOSIS — I6523 Occlusion and stenosis of bilateral carotid arteries: Secondary | ICD-10-CM | POA: Insufficient documentation

## 2016-12-08 HISTORY — PX: ABDOMINAL AORTOGRAM W/LOWER EXTREMITY: CATH118223

## 2016-12-08 HISTORY — PX: PERIPHERAL VASCULAR BALLOON ANGIOPLASTY: CATH118281

## 2016-12-08 HISTORY — PX: PERIPHERAL VASCULAR ATHERECTOMY: CATH118256

## 2016-12-08 LAB — POCT I-STAT, CHEM 8
BUN: 22 mg/dL — AB (ref 6–20)
CALCIUM ION: 1.19 mmol/L (ref 1.15–1.40)
CHLORIDE: 99 mmol/L — AB (ref 101–111)
Creatinine, Ser: 1 mg/dL (ref 0.61–1.24)
GLUCOSE: 143 mg/dL — AB (ref 65–99)
HCT: 33 % — ABNORMAL LOW (ref 39.0–52.0)
Hemoglobin: 11.2 g/dL — ABNORMAL LOW (ref 13.0–17.0)
Potassium: 3.3 mmol/L — ABNORMAL LOW (ref 3.5–5.1)
Sodium: 134 mmol/L — ABNORMAL LOW (ref 135–145)
TCO2: 24 mmol/L (ref 0–100)

## 2016-12-08 LAB — POCT ACTIVATED CLOTTING TIME
ACTIVATED CLOTTING TIME: 202 s
ACTIVATED CLOTTING TIME: 224 s
Activated Clotting Time: 224 seconds
Activated Clotting Time: 191 seconds
Activated Clotting Time: 175 seconds

## 2016-12-08 LAB — GLUCOSE, CAPILLARY: GLUCOSE-CAPILLARY: 123 mg/dL — AB (ref 65–99)

## 2016-12-08 SURGERY — ABDOMINAL AORTOGRAM W/LOWER EXTREMITY
Anesthesia: LOCAL | Laterality: Right

## 2016-12-08 MED ORDER — SODIUM CHLORIDE 0.9 % IV SOLN: 1.0000 mL/kg/h | INTRAVENOUS | Status: DC

## 2016-12-08 MED ORDER — ACETAMINOPHEN 325 MG RE SUPP: 325.0000 mg | RECTAL | Status: DC | PRN

## 2016-12-08 MED ORDER — LIDOCAINE HCL (PF) 1 % IJ SOLN
INTRAMUSCULAR | Status: DC | PRN
  Administered 2016-12-08: 15 mL via SUBCUTANEOUS

## 2016-12-08 MED ORDER — HYDRALAZINE HCL 20 MG/ML IJ SOLN: 5.0000 mg | INTRAMUSCULAR | Status: DC | PRN

## 2016-12-08 MED ORDER — VIPERSLIDE LUBRICANT OPTIME
TOPICAL | Status: DC | PRN
  Administered 2016-12-08: 11:00:00 via SURGICAL_CAVITY

## 2016-12-08 MED ORDER — OXYCODONE HCL 5 MG PO TABS: 5.0000 mg | ORAL_TABLET | ORAL | Status: DC | PRN

## 2016-12-08 MED ORDER — FENTANYL CITRATE (PF) 100 MCG/2ML IJ SOLN
INTRAMUSCULAR | Status: AC
  Filled 2016-12-08: qty 2

## 2016-12-08 MED ORDER — SODIUM CHLORIDE 0.9 % IV SOLN: 500.0000 mL | Freq: Once | INTRAVENOUS | Status: DC | PRN

## 2016-12-08 MED ORDER — NITROGLYCERIN IN D5W 200-5 MCG/ML-% IV SOLN
INTRAVENOUS | Status: AC
  Filled 2016-12-08: qty 250

## 2016-12-08 MED ORDER — NITROGLYCERIN 1 MG/10 ML FOR IR/CATH LAB
INTRA_ARTERIAL | Status: DC | PRN
  Administered 2016-12-08: 400 ug via INTRA_ARTERIAL

## 2016-12-08 MED ORDER — PHENOL 1.4 % MT LIQD: 1.0000 | OROMUCOSAL | Status: DC | PRN

## 2016-12-08 MED ORDER — VERAPAMIL HCL 2.5 MG/ML IV SOLN
INTRAVENOUS | Status: AC
  Filled 2016-12-08: qty 2

## 2016-12-08 MED ORDER — IODIXANOL 320 MG/ML IV SOLN
INTRAVENOUS | Status: DC | PRN
  Administered 2016-12-08: 180 mL via INTRA_ARTERIAL

## 2016-12-08 MED ORDER — HEPARIN SODIUM (PORCINE) 1000 UNIT/ML IJ SOLN
INTRAMUSCULAR | Status: DC | PRN
  Administered 2016-12-08: 2000 [IU] via INTRAVENOUS
  Administered 2016-12-08: 1000 [IU] via INTRAVENOUS
  Administered 2016-12-08: 6000 [IU] via INTRAVENOUS

## 2016-12-08 MED ORDER — LABETALOL HCL 5 MG/ML IV SOLN: 10.0000 mg | INTRAVENOUS | Status: DC | PRN

## 2016-12-08 MED ORDER — MORPHINE SULFATE (PF) 10 MG/ML IV SOLN: 2.0000 mg | INTRAVENOUS | Status: DC | PRN

## 2016-12-08 MED ORDER — ONDANSETRON HCL 4 MG/2ML IJ SOLN: 4.0000 mg | Freq: Four times a day (QID) | INTRAMUSCULAR | Status: DC | PRN

## 2016-12-08 MED ORDER — HEPARIN (PORCINE) IN NACL 2-0.9 UNIT/ML-% IJ SOLN
INTRAMUSCULAR | Status: AC
  Filled 2016-12-08: qty 1000

## 2016-12-08 MED ORDER — LIDOCAINE HCL (PF) 1 % IJ SOLN
INTRAMUSCULAR | Status: AC
  Filled 2016-12-08: qty 30

## 2016-12-08 MED ORDER — METOPROLOL TARTRATE 5 MG/5ML IV SOLN: 2.0000 mg | INTRAVENOUS | Status: DC | PRN

## 2016-12-08 MED ORDER — FENTANYL CITRATE (PF) 100 MCG/2ML IJ SOLN
INTRAMUSCULAR | Status: DC | PRN
  Administered 2016-12-08 (×4): 25 ug via INTRAVENOUS

## 2016-12-08 MED ORDER — MIDAZOLAM HCL 2 MG/2ML IJ SOLN
INTRAMUSCULAR | Status: AC
  Filled 2016-12-08: qty 2

## 2016-12-08 MED ORDER — SODIUM CHLORIDE 0.9 % IV SOLN
INTRAVENOUS | Status: DC
  Administered 2016-12-08: 08:00:00 via INTRAVENOUS

## 2016-12-08 MED ORDER — GUAIFENESIN-DM 100-10 MG/5ML PO SYRP
15.0000 mL | ORAL_SOLUTION | ORAL | Status: DC | PRN
  Administered 2016-12-08: 15 mL via ORAL
  Filled 2016-12-08 (×2): qty 15

## 2016-12-08 MED ORDER — ALUM & MAG HYDROXIDE-SIMETH 200-200-20 MG/5ML PO SUSP: 15.0000 mL | ORAL | Status: DC | PRN

## 2016-12-08 MED ORDER — HEPARIN (PORCINE) IN NACL 2-0.9 UNIT/ML-% IJ SOLN
INTRAMUSCULAR | Status: DC | PRN
  Administered 2016-12-08: 1000 mL

## 2016-12-08 MED ORDER — HEPARIN SODIUM (PORCINE) 1000 UNIT/ML IJ SOLN
INTRAMUSCULAR | Status: AC
  Filled 2016-12-08: qty 1

## 2016-12-08 MED ORDER — DOCUSATE SODIUM 100 MG PO CAPS: 100.0000 mg | ORAL_CAPSULE | Freq: Every day | ORAL | Status: DC

## 2016-12-08 MED ORDER — MIDAZOLAM HCL 2 MG/2ML IJ SOLN
INTRAMUSCULAR | Status: DC | PRN
Start: 2016-12-08 — End: 2016-12-08
  Administered 2016-12-08 (×4): 1 mg via INTRAVENOUS

## 2016-12-08 MED ORDER — ACETAMINOPHEN 325 MG PO TABS
325.0000 mg | ORAL_TABLET | ORAL | Status: DC | PRN
Start: 2016-12-08 — End: 2016-12-08

## 2016-12-08 SURGICAL SUPPLY — 26 items
BAG SNAP BAND KOVER 36X36 (MISCELLANEOUS) ×1 IMPLANT
BALLN STERLING OTW 3X150X150 (BALLOONS) ×2
BALLOON STERLING OTW 3X150X150 (BALLOONS) IMPLANT
CATH OMNI FLUSH 5F 65CM (CATHETERS) ×1 IMPLANT
CATH QUICKCROSS .018X135CM (MICROCATHETER) ×1 IMPLANT
COVER DOME SNAP 22 D (MISCELLANEOUS) ×1 IMPLANT
COVER PRB 48X5XTLSCP FOLD TPE (BAG) IMPLANT
COVER PROBE 5X48 (BAG) ×2
CROWN STEALTH MICRO-30 1.25MM (CATHETERS) ×1 IMPLANT
GUIDEWIRE STR TIP .014X300X8 (WIRE) ×1 IMPLANT
KIT ENCORE 26 ADVANTAGE (KITS) ×1 IMPLANT
KIT MICROINTRODUCER STIFF 5F (SHEATH) ×1 IMPLANT
KIT PV (KITS) ×2 IMPLANT
LUBRICANT VIPERSLIDE CORONARY (MISCELLANEOUS) ×1 IMPLANT
SHEATH HIGHFLEX ANSEL 6FRX55 (SHEATH) ×1 IMPLANT
SHEATH PINNACLE 5F 10CM (SHEATH) ×1 IMPLANT
SHIELD RADPAD SCOOP 12X17 (MISCELLANEOUS) ×2 IMPLANT
SYR MEDRAD MARK V 150ML (SYRINGE) ×2 IMPLANT
TAPE VIPERTRACK RADIOPAQ (MISCELLANEOUS) IMPLANT
TAPE VIPERTRACK RADIOPAQUE (MISCELLANEOUS) ×2
TRANSDUCER W/STOPCOCK (MISCELLANEOUS) ×2 IMPLANT
TRAY PV CATH (CUSTOM PROCEDURE TRAY) ×2 IMPLANT
WIRE BENTSON .035X145CM (WIRE) ×1 IMPLANT
WIRE G V18X300CM (WIRE) ×1 IMPLANT
WIRE SPARTACORE .014X300CM (WIRE) ×1 IMPLANT
WIRE VIPER ADVANCE .017X335CM (WIRE) ×1 IMPLANT

## 2016-12-08 NOTE — H&P (View-Only) (Signed)
POST OPERATIVE OFFICE NOTE    CC:  F/u for surgery  HPI:  This is a 66 y.o. male who is s/p  Procedure Performed:             1.  Ultrasound-guided access, left femoral artery             2.  Abdominal aortogram             3.  Right lower extremity runoff             4.  Stent, right superficial femoral/popliteal artery             5.  Failed balloon angioplasty, right posterior tibial artery He states his right foot feels warmer, but he is concerned about the wounds not healing.  He goes to the wound center 3 times a week and has a HH RN 2 times a week.     Reginald Ayers is a 66 y.o. male, who is Referred to me for a second opinion regarding ulcers on his right leg.  He states they have been present for approximately 8 months.  He has a duplex of the right leg would suggest popliteal occlusion as well as tibial vessel disease.  His most recent ABI was 0.47 on the right.  The patient currently is getting wound care 3 times a week with [facility].  The patient denies any real claudication symptoms.  The patient has a history of carotid occlusive disease.  He has a chronic occlusion of the left internal carotid artery and 60-79% right carotid stenosis.  The last imaging study was one year ago.  He has a history of smoking.  Allergies  Allergen Reactions  . Actos [Pioglitazone] Shortness Of Breath    Leg swelling   . Lisinopril Cough    Current Outpatient Prescriptions  Medication Sig Dispense Refill  . albuterol (PROVENTIL HFA;VENTOLIN HFA) 108 (90 Base) MCG/ACT inhaler Inhale 2 puffs into the lungs every 4 (four) hours as needed for wheezing or shortness of breath.     Marland Kitchen albuterol (PROVENTIL) (2.5 MG/3ML) 0.083% nebulizer solution Inhale 3 mLs into the lungs 2 (two) times daily as needed for shortness of breath.  1  . aspirin EC 81 MG tablet Take 81 mg by mouth daily.    . clopidogrel (PLAVIX) 75 MG tablet Take 75 mg by mouth daily.     . Cyanocobalamin (VITAMIN B-12  IJ) Inject 1 application as directed every 30 (thirty) days.     Marland Kitchen dextromethorphan-guaiFENesin (MUCINEX DM) 30-600 MG 12hr tablet Take 1 tablet by mouth 2 (two) times daily as needed for cough.    . furosemide (LASIX) 40 MG tablet Take 40 mg by mouth daily as needed for edema.    Marland Kitchen glipiZIDE (GLUCOTROL XL) 10 MG 24 hr tablet Take 10 mg by mouth 2 (two) times daily.     . Homeopathic Products (LEG CRAMP RELIEF SL) Place 2 tablets under the tongue 2 (two) times daily as needed (leg cramps).    Marland Kitchen ibuprofen (ADVIL,MOTRIN) 200 MG tablet Take 800 mg by mouth every 8 (eight) hours as needed for moderate pain.    Marland Kitchen irbesartan (AVAPRO) 300 MG tablet Take 300 mg by mouth daily.     . metFORMIN (GLUCOPHAGE) 500 MG tablet Take 1,000 mg by mouth 2 (two) times daily with a meal.     . Multiple Vitamin (MULTIVITAMIN WITH MINERALS) TABS tablet Take 1 tablet by mouth daily.    Marland Kitchen  NITROSTAT 0.4 MG SL tablet Take 0.4 mg by mouth every 5 (five) minutes as needed for chest pain.   0  . Polyvinyl Alcohol-Povidone (REFRESH OP) Apply 1 drop to eye 2 (two) times daily as needed (dry eyes).    . Pseudoeph-Doxylamine-DM-APAP (NYQUIL PO) Take 1 drop by mouth at bedtime.    . ranitidine (ZANTAC) 150 MG tablet Take 150 mg by mouth daily.    . simvastatin (ZOCOR) 40 MG tablet Take 40 mg by mouth every evening.    Marland Kitchen SPIRIVA HANDIHALER 18 MCG inhalation capsule Place 18 mcg into inhaler and inhale daily.     . traMADol (ULTRAM) 50 MG tablet Take 50 mg by mouth 3 (three) times daily as needed for severe pain.     . vitamin B-12 (CYANOCOBALAMIN) 1000 MCG tablet Take 1,000 mcg by mouth daily as needed (tingling).     No current facility-administered medications for this visit.      ROS:  See HPI  Physical Exam:  Vitals:   11/30/16 1327 11/30/16 1328  BP: (!) 147/79 (!) 143/73  Pulse: 91   Resp: (!) 21   Temp: 97.3 F (36.3 C)     Incision:  Left common femoral stick site is well healed.   Right anterior shin wound 1  MM depth 4 X 2 cm Right dorsum of the foot wound 3cm circumference 1 MM depth with areas of yellow necrosis and red granulation tissue. Extremities:  Left LE pitting edema > right, Palpable femoral pulses Neuro: grossly intact sensation B  ABI: Right 0.73 PT biphasic flow, DP monophasic flow Left 0.38 Monophasic flow  Arterial Duplex Elevated distal SFA and proximal popliteal suggest 1-49% stenosis on the right.  Proximal stent 231 cm/s Biphasic flow, distal stent 194 cm/s monophasic,  Angiogram findings: Right Lower Extremity:  Right common femoral and profunda femoral artery are widely patent.  The origin of the right superficial femoral artery has approximately a 60-70 percent stenosis.  There is a stent within the midportion of the superficial femoral artery which is occluded.  There is reconstitution beyond the stent.  The popliteal artery is patent throughout it's course.  The dominant vessel to the ankle is the peroneal artery.  There is reconstitution of the posterior tibial.  Posterior tibial artery does appear to be patent however there is a high-grade stenosis versus short segment occlusion in its midportion.     Assessment/Plan:  This is a 66 y.o. male who is s/p: Right LE angiogram with intervention.  Impression:            #1  occluded right superficial femoral artery stent which was successfully crossed.  I inserted a 6 x 2 50 Viabahn covered stent to treat the superficial femoral and popliteal artery lesions.  The proximal superficial femoral artery was treated with a 7 x 60 Innova            #2  severe stenosis versus occlusion of the posterior tibial artery which is the dominant vessel across the foot.  I attempted to cross this lesion but was unsuccessful.  If the patient is unable to heal his wound I will bring him back for a repeat attempt from the left groin.   His wounds are not healing well.  Dr. Myra Gianotti has decided to schedule him for another angiogram to try and  improve the PT flow on 4/ 17/2018.  The patient agrees to this plan.   He is on Plavix and aspirin daily.  Reginald Ayers,  Reginald Ayers [facility] PA-C [facility] [phone]  The patient was seen in conjunction with Dr. Myra Gianotti today.  The patient's right leg ulcers have slightly improved but are still present.  At his last arteriogram I was able to open up an occluded stent.  He had residual disease in his tibial vessels which I elected not to address at that time.  Because he has not had wound healing to my satisfaction, think we need to go back and proceed with tibial intervention.  I will get extra imaging of his peroneal artery but focus most efforts on his posterior tibial disease.  This is been scheduled for April 17.  It would be a left groin access with right leg intervention.   [name]

## 2016-12-08 NOTE — Progress Notes (Signed)
6Fr 55cm sheath aspirated and pulled.  Pressure held for 20 minutes.  Hemostasis achieved, site level 0.  Tegaderm and gauze dressing applied to site.  Dopplered pulses.  Instructions given to patient.  Patient verbalizes understanding of instructions.    Bedrest begins at 13:15, until 17:15.

## 2016-12-08 NOTE — Discharge Instructions (Signed)
°  NO METFORMIN/GLUCOPHAGE FOR 2 DAYS ° ° °Femoral Site Care °Refer to this sheet in the next few weeks. These instructions provide you with information about caring for yourself after your procedure. Your health care provider may also give you more specific instructions. Your treatment has been planned according to current medical practices, but problems sometimes occur. Call your health care provider if you have any problems or questions after your procedure. °What can I expect after the procedure? °After your procedure, it is typical to have the following: °· Bruising at the site that usually fades within 1-2 weeks. °· Blood collecting in the tissue (hematoma) that may be painful to the touch. It should usually decrease in size and tenderness within 1-2 weeks. °Follow these instructions at home: °· Take medicines only as directed by your health care provider. °· You may shower 24-48 hours after the procedure or as directed by your health care provider. Remove the bandage (dressing) and gently wash the site with plain soap and water. Pat the area dry with a clean towel. Do not rub the site, because this may cause bleeding. °· Do not take baths, swim, or use a hot tub until your health care provider approves. °· Check your insertion site every day for redness, swelling, or drainage. °· Do not apply powder or lotion to the site. °· Limit use of stairs to twice a day for the first 2-3 days or as directed by your health care provider. °· Do not squat for the first 2-3 days or as directed by your health care provider. °· Do not lift over 10 lb (4.5 kg) for 5 days after your procedure or as directed by your health care provider. °· Ask your health care provider when it is okay to: °¨ Return to work or school. °¨ Resume usual physical activities or sports. °¨ Resume sexual activity. °· Do not drive home if you are discharged the same day as the procedure. Have someone else drive you. °· You may drive 24 hours after the  procedure unless otherwise instructed by your health care provider. °· Do not operate machinery or power tools for 24 hours after the procedure or as directed by your health care provider. °· If your procedure was done as an outpatient procedure, which means that you went home the same day as your procedure, a responsible adult should be with you for the first 24 hours after you arrive home. °· Keep all follow-up visits as directed by your health care provider. This is important. °Contact a health care provider if: °· You have a fever. °· You have chills. °· You have increased bleeding from the site. Hold pressure on the site. °Get help right away if: °· You have unusual pain at the site. °· You have redness, warmth, or swelling at the site. °· You have drainage (other than a small amount of blood on the dressing) from the site. °· The site is bleeding, and the bleeding does not stop after 30 minutes of holding steady pressure on the site. °· Your leg or foot becomes pale, cool, tingly, or numb. °This information is not intended to replace advice given to you by your health care provider. Make sure you discuss any questions you have with your health care provider. °Document Released: 04/13/2014 Document Revised: 01/16/2016 Document Reviewed: 02/27/2014 °Elsevier Interactive Patient Education © 2017 [employer] ° °

## 2016-12-08 NOTE — Telephone Encounter (Signed)
Sched appt 01/08/17 at 4:00 and MD 01/11/17 at 1:15.

## 2016-12-08 NOTE — Progress Notes (Signed)
Pt does not have any friends or family with him at the hospital.  I spoke with his roommate Benjamine Mola [phone]) who states that he has to work until 5-5:30 but would definitely be able to pick him up after he is done working between 5:30 and 6 and will be staying with him for the rest of the night. Pt also stated that a neighbor Stark Bray might be able to drive him home earlier but a message would have to be carried to him by his other roommate if he is home [name] [place]) [phone].

## 2016-12-08 NOTE — Op Note (Signed)
Patient name: Reginald Ayers MRN: [id number] DOB: July 24, 1951 Sex: male  12/08/2016 Pre-operative Diagnosis: Right leg ulcer Post-operative diagnosis:  Same Surgeon:  [name] Procedure Performed:  1.  Ultrasound-guided access, left femoral artery  2.  Abdominal aortogram  3.  Right lower extremity runoff  4.  Atherectomy with angioplasty, right posterior tibial artery  5.  Conscious sedation (99 minutes)     Indications:  The patient previously has undergone recanalization of an occluded stent with subsequent Viabahn placement.  He has not been able to heal his wound therefore he comes in today for further evaluation of his tibial vessels.  Procedure:  The patient was identified in the holding area and taken to room 8.  The patient was then placed supine on the table and prepped and draped in the usual sterile fashion.  A time out was called.  Conscious sedation was performed with the use of IV fentanyl and Versed in a continuous physician  Ultrasound was used to evaluate the left common femoral artery.  It was patent .  A digital ultrasound image was acquired.  A micropuncture needle was used to access the left common femoral artery under ultrasound guidance.  An 018 wire was advanced without resistance and a micropuncture sheath was placed.  The 018 wire was removed and a benson wire was placed.  The micropuncture sheath was exchanged for a 5 french sheath.  An omniflush catheter was advanced over the wire to the level of L-1.  An abdominal angiogram was obtained.  Next, using the omniflush catheter and a benson wire, the aortic bifurcation was crossed and the catheter was placed into theright external iliac artery and right runoff was obtained.   Findings:   Aortogram:  No significant renal artery stenosis.  The infrarenal abdominal aorta is widely patent.  Bilateral common and external iliac arteries are widely patent  Right Lower Extremity:  The right common femoral and profunda  femoral artery are patent throughout their course.  The stent within the right superficial femoral artery is widely patent.  The bloody popliteal artery is patent throughout it's course.  There is two-vessel runoff via the peroneal and posterior tibial artery.  The posterior tibial artery has extensive disease proximally, 80% for a 10-12 centimeter segment.  The posterior tibial is the dominant vessel across the ankle.  Left Lower Extremity:  Not evaluated  Intervention:  After the above images were acquired the decision was made to proceed with intervention.  A 6 French 55 cm sheath was advanced into the right superficial femoral artery.  The patient was fully heparinized.  I used a 018 quick cross catheter and a V-18 wire was used to select the posterior tibial artery and across the near total occlusion.  A Viper wire was then placed.  The CSI 1.25 micro-device was then inserted.  I performed atherectomy of the proximal posterior tibial artery.  During the atherectomy portion, the CSI device got stuck M would not advance.  It was withdrawn and felt to be functioning appropriately and therefore I tried to perform atherectomy a second time.  It again stop working and had to be removed.  In removing the CSI device, I lost wire access because the tube were stuck together.  I therefore had to regain access to the posterior tibial artery.  In obtaining images that did show that there was some extravasation from where the device it entered the artery.  I was ultimately able to get a CT-18 wire  across the posterior tibial artery and performed primary balloon angioplasty using a 3 x 1 50 balloon.  This was taken to nominal pressure for 2 minutes.  Completion imaging showed resolution of the stenosis within the line flow to the posterior tibial artery and no evidence of extravasation.  Catheters and wires were removed.  The sheath was withdrawn to the left external iliac artery.  The patient taken the holding area for  sheath pull once his coag profile corrects  Impression:  #1  widely patent right superficial femoral artery and popliteal stent  #2  greater than 80% right posterior tibial artery stenosis which was treated with atherectomy and balloon angioplasty with residual stenosis less than 5%  #3  device malfunction (CSI)     V. [name], M.D. [facility] [place] Office: [phone] Pager:  [phone]

## 2016-12-08 NOTE — Telephone Encounter (Signed)
-----   Message from Sharee Pimple, RN sent at 12/08/2016 12:49 PM EDT ----- Regarding: 1 month w/ ABI   ----- Message ----- From: Nada Libman, MD Sent: 12/08/2016  11:44 AM To: [facility] Charge Pool  12/08/2016  Surgeon:  [name] Procedure Performed:  1.  Ultrasound-guided access, left femoral artery  2.  Abdominal aortogram  3.  Right lower extremity runoff  4.  Atherectomy with angioplasty, right posterior tibial artery  5.  Conscious sedation (99 minutes)  Follow-up one month with a duplex and ABIs of the right leg to see me

## 2016-12-08 NOTE — Interval H&P Note (Signed)
History and Physical Interval Note:  12/08/2016 10:28 PM  Reginald Ayers  has presented today for surgery, with the diagnosis of right leg - pvd with right foot ulcer  The various methods of treatment have been discussed with the patient and family. After consideration of risks, benefits and other options for treatment, the patient has consented to  Procedure(s): Abdominal Aortogram w/Lower Extremity (Right) Peripheral Vascular Atherectomy (Right) Peripheral Vascular Balloon Angioplasty (Right) as a surgical intervention .  The patient's history has been reviewed, patient examined, no change in status, stable for surgery.  I have reviewed the patient's chart and labs.  Questions were answered to the patient's satisfaction.     [name]

## 2016-12-09 ENCOUNTER — Encounter (HOSPITAL_COMMUNITY): Payer: Self-pay | Admitting: Surgery

## 2016-12-09 MED FILL — Fentanyl Citrate Preservative Free (PF) Inj 100 MCG/2ML: INTRAMUSCULAR | Qty: 2 | Status: AC

## 2016-12-10 DIAGNOSIS — L97512 Non-pressure chronic ulcer of other part of right foot with fat layer exposed: Secondary | ICD-10-CM | POA: Diagnosis not present

## 2016-12-10 DIAGNOSIS — E1151 Type 2 diabetes mellitus with diabetic peripheral angiopathy without gangrene: Secondary | ICD-10-CM | POA: Diagnosis not present

## 2016-12-10 DIAGNOSIS — L97221 Non-pressure chronic ulcer of left calf limited to breakdown of skin: Secondary | ICD-10-CM | POA: Diagnosis not present

## 2016-12-10 DIAGNOSIS — L97312 Non-pressure chronic ulcer of right ankle with fat layer exposed: Secondary | ICD-10-CM | POA: Diagnosis not present

## 2016-12-10 DIAGNOSIS — L97812 Non-pressure chronic ulcer of other part of right lower leg with fat layer exposed: Secondary | ICD-10-CM | POA: Diagnosis not present

## 2016-12-10 DIAGNOSIS — L97213 Non-pressure chronic ulcer of right calf with necrosis of muscle: Secondary | ICD-10-CM | POA: Diagnosis not present

## 2016-12-10 DIAGNOSIS — E11622 Type 2 diabetes mellitus with other skin ulcer: Secondary | ICD-10-CM | POA: Diagnosis not present

## 2016-12-10 DIAGNOSIS — E11621 Type 2 diabetes mellitus with foot ulcer: Secondary | ICD-10-CM | POA: Diagnosis not present

## 2016-12-11 DIAGNOSIS — D510 Vitamin B12 deficiency anemia due to intrinsic factor deficiency: Secondary | ICD-10-CM | POA: Diagnosis not present

## 2016-12-15 DIAGNOSIS — I743 Embolism and thrombosis of arteries of the lower extremities: Secondary | ICD-10-CM | POA: Diagnosis not present

## 2016-12-15 DIAGNOSIS — Z4800 Encounter for change or removal of nonsurgical wound dressing: Secondary | ICD-10-CM | POA: Diagnosis not present

## 2016-12-15 DIAGNOSIS — L97812 Non-pressure chronic ulcer of other part of right lower leg with fat layer exposed: Secondary | ICD-10-CM | POA: Diagnosis not present

## 2016-12-15 DIAGNOSIS — E11622 Type 2 diabetes mellitus with other skin ulcer: Secondary | ICD-10-CM | POA: Diagnosis not present

## 2016-12-15 DIAGNOSIS — E1151 Type 2 diabetes mellitus with diabetic peripheral angiopathy without gangrene: Secondary | ICD-10-CM | POA: Diagnosis not present

## 2016-12-15 DIAGNOSIS — L97312 Non-pressure chronic ulcer of right ankle with fat layer exposed: Secondary | ICD-10-CM | POA: Diagnosis not present

## 2016-12-17 ENCOUNTER — Telehealth: Payer: Self-pay

## 2016-12-17 DIAGNOSIS — E11621 Type 2 diabetes mellitus with foot ulcer: Secondary | ICD-10-CM | POA: Diagnosis not present

## 2016-12-17 DIAGNOSIS — L97515 Non-pressure chronic ulcer of other part of right foot with muscle involvement without evidence of necrosis: Secondary | ICD-10-CM | POA: Diagnosis not present

## 2016-12-17 DIAGNOSIS — L97812 Non-pressure chronic ulcer of other part of right lower leg with fat layer exposed: Secondary | ICD-10-CM | POA: Diagnosis not present

## 2016-12-17 DIAGNOSIS — L97312 Non-pressure chronic ulcer of right ankle with fat layer exposed: Secondary | ICD-10-CM | POA: Diagnosis not present

## 2016-12-17 DIAGNOSIS — E11622 Type 2 diabetes mellitus with other skin ulcer: Secondary | ICD-10-CM | POA: Diagnosis not present

## 2016-12-17 DIAGNOSIS — L97213 Non-pressure chronic ulcer of right calf with necrosis of muscle: Secondary | ICD-10-CM | POA: Diagnosis not present

## 2016-12-17 DIAGNOSIS — R600 Localized edema: Secondary | ICD-10-CM | POA: Diagnosis not present

## 2016-12-17 DIAGNOSIS — L97221 Non-pressure chronic ulcer of left calf limited to breakdown of skin: Secondary | ICD-10-CM | POA: Diagnosis not present

## 2016-12-17 DIAGNOSIS — E1151 Type 2 diabetes mellitus with diabetic peripheral angiopathy without gangrene: Secondary | ICD-10-CM | POA: Diagnosis not present

## 2016-12-17 NOTE — Telephone Encounter (Signed)
I spoke with the patient who confirmed that Dr. Nehemiah Settle at [facility] is his PCP whom he sees every 3 months.  He stated that his AWV is scheduled for 01/15/17. [place] ([facility])

## 2016-12-18 ENCOUNTER — Telehealth: Payer: Self-pay | Admitting: Cardiovascular Disease

## 2016-12-18 NOTE — Telephone Encounter (Signed)
New message   Pt c/o swelling: STAT is pt has developed SOB within 24 hours  1. How long have you been experiencing swelling? Pt states "awhile"  2. Where is the swelling located? On both legs with ulcers - vein and vascular center stated he was retaining fluid in legs  3.  Are you currently taking a "fluid pill"? no  4.  Are you currently SOB? no  5.  Have you traveled recently? no

## 2016-12-18 NOTE — Telephone Encounter (Signed)
Spoke with the patient. He stated that he had an atherectomy with angioplasty of the right leg on 4/17. He has been seen by wound care specialists since the surgery. He stated that his right leg is "a little bit" swollen and now has weeping skin. He started taking his prn Furosemide 40 mg and has seen some improvement. He denies shortness of breath. He stated that the wound care specialist advised that he make an appointment with Dr. Allyson Sabal for check up on his right leg. He sees the wound care specialist on Monday. He is also being seen by [facility]. The first available is May 30th. Will route to the physician for further recommendation.

## 2016-12-20 NOTE — Telephone Encounter (Signed)
He will need to follow up with Dr Myra Gianotti for this.

## 2016-12-21 DIAGNOSIS — L97213 Non-pressure chronic ulcer of right calf with necrosis of muscle: Secondary | ICD-10-CM | POA: Diagnosis not present

## 2016-12-21 DIAGNOSIS — E1151 Type 2 diabetes mellitus with diabetic peripheral angiopathy without gangrene: Secondary | ICD-10-CM | POA: Diagnosis not present

## 2016-12-21 DIAGNOSIS — E11621 Type 2 diabetes mellitus with foot ulcer: Secondary | ICD-10-CM | POA: Diagnosis not present

## 2016-12-21 DIAGNOSIS — L97221 Non-pressure chronic ulcer of left calf limited to breakdown of skin: Secondary | ICD-10-CM | POA: Diagnosis not present

## 2016-12-21 DIAGNOSIS — L97512 Non-pressure chronic ulcer of other part of right foot with fat layer exposed: Secondary | ICD-10-CM | POA: Diagnosis not present

## 2016-12-21 DIAGNOSIS — L97312 Non-pressure chronic ulcer of right ankle with fat layer exposed: Secondary | ICD-10-CM | POA: Diagnosis not present

## 2016-12-21 DIAGNOSIS — E11622 Type 2 diabetes mellitus with other skin ulcer: Secondary | ICD-10-CM | POA: Diagnosis not present

## 2016-12-21 DIAGNOSIS — L97812 Non-pressure chronic ulcer of other part of right lower leg with fat layer exposed: Secondary | ICD-10-CM | POA: Diagnosis not present

## 2016-12-21 NOTE — Telephone Encounter (Signed)
Pt notified he states that he does have an appt with Dr Myra Gianotti in about a month. Notified pt to call him today and give him an update on his weeping legs and lasix use so he can decide if waiting until his upcoming appt or if he needs to be seen sooner. Pt verbalizes understanding

## 2016-12-24 ENCOUNTER — Encounter (HOSPITAL_BASED_OUTPATIENT_CLINIC_OR_DEPARTMENT_OTHER): Payer: Medicare Other | Attending: Internal Medicine

## 2016-12-24 DIAGNOSIS — E1151 Type 2 diabetes mellitus with diabetic peripheral angiopathy without gangrene: Secondary | ICD-10-CM | POA: Diagnosis not present

## 2016-12-24 DIAGNOSIS — I743 Embolism and thrombosis of arteries of the lower extremities: Secondary | ICD-10-CM | POA: Diagnosis not present

## 2016-12-24 DIAGNOSIS — L97221 Non-pressure chronic ulcer of left calf limited to breakdown of skin: Secondary | ICD-10-CM | POA: Insufficient documentation

## 2016-12-24 DIAGNOSIS — L97213 Non-pressure chronic ulcer of right calf with necrosis of muscle: Secondary | ICD-10-CM | POA: Insufficient documentation

## 2016-12-24 DIAGNOSIS — I2510 Atherosclerotic heart disease of native coronary artery without angina pectoris: Secondary | ICD-10-CM | POA: Insufficient documentation

## 2016-12-24 DIAGNOSIS — L97312 Non-pressure chronic ulcer of right ankle with fat layer exposed: Secondary | ICD-10-CM | POA: Diagnosis not present

## 2016-12-24 DIAGNOSIS — E11622 Type 2 diabetes mellitus with other skin ulcer: Secondary | ICD-10-CM | POA: Diagnosis not present

## 2016-12-24 DIAGNOSIS — F17210 Nicotine dependence, cigarettes, uncomplicated: Secondary | ICD-10-CM | POA: Insufficient documentation

## 2016-12-24 DIAGNOSIS — E11621 Type 2 diabetes mellitus with foot ulcer: Secondary | ICD-10-CM | POA: Insufficient documentation

## 2016-12-24 DIAGNOSIS — Z9221 Personal history of antineoplastic chemotherapy: Secondary | ICD-10-CM | POA: Insufficient documentation

## 2016-12-24 DIAGNOSIS — Z4800 Encounter for change or removal of nonsurgical wound dressing: Secondary | ICD-10-CM | POA: Diagnosis not present

## 2016-12-24 DIAGNOSIS — L97812 Non-pressure chronic ulcer of other part of right lower leg with fat layer exposed: Secondary | ICD-10-CM | POA: Diagnosis not present

## 2016-12-24 DIAGNOSIS — J449 Chronic obstructive pulmonary disease, unspecified: Secondary | ICD-10-CM | POA: Insufficient documentation

## 2016-12-28 DIAGNOSIS — E1151 Type 2 diabetes mellitus with diabetic peripheral angiopathy without gangrene: Secondary | ICD-10-CM | POA: Diagnosis not present

## 2016-12-28 DIAGNOSIS — Z4800 Encounter for change or removal of nonsurgical wound dressing: Secondary | ICD-10-CM | POA: Diagnosis not present

## 2016-12-28 DIAGNOSIS — L97812 Non-pressure chronic ulcer of other part of right lower leg with fat layer exposed: Secondary | ICD-10-CM | POA: Diagnosis not present

## 2016-12-28 DIAGNOSIS — I743 Embolism and thrombosis of arteries of the lower extremities: Secondary | ICD-10-CM | POA: Diagnosis not present

## 2016-12-28 DIAGNOSIS — E11622 Type 2 diabetes mellitus with other skin ulcer: Secondary | ICD-10-CM | POA: Diagnosis not present

## 2016-12-28 DIAGNOSIS — L97312 Non-pressure chronic ulcer of right ankle with fat layer exposed: Secondary | ICD-10-CM | POA: Diagnosis not present

## 2016-12-31 DIAGNOSIS — L97221 Non-pressure chronic ulcer of left calf limited to breakdown of skin: Secondary | ICD-10-CM | POA: Diagnosis not present

## 2016-12-31 DIAGNOSIS — I2510 Atherosclerotic heart disease of native coronary artery without angina pectoris: Secondary | ICD-10-CM | POA: Diagnosis not present

## 2016-12-31 DIAGNOSIS — L97812 Non-pressure chronic ulcer of other part of right lower leg with fat layer exposed: Secondary | ICD-10-CM | POA: Diagnosis not present

## 2016-12-31 DIAGNOSIS — L97312 Non-pressure chronic ulcer of right ankle with fat layer exposed: Secondary | ICD-10-CM | POA: Diagnosis not present

## 2016-12-31 DIAGNOSIS — E1151 Type 2 diabetes mellitus with diabetic peripheral angiopathy without gangrene: Secondary | ICD-10-CM | POA: Diagnosis not present

## 2016-12-31 DIAGNOSIS — E11622 Type 2 diabetes mellitus with other skin ulcer: Secondary | ICD-10-CM | POA: Diagnosis not present

## 2016-12-31 DIAGNOSIS — J449 Chronic obstructive pulmonary disease, unspecified: Secondary | ICD-10-CM | POA: Diagnosis not present

## 2016-12-31 DIAGNOSIS — L97213 Non-pressure chronic ulcer of right calf with necrosis of muscle: Secondary | ICD-10-CM | POA: Diagnosis not present

## 2016-12-31 DIAGNOSIS — Z9221 Personal history of antineoplastic chemotherapy: Secondary | ICD-10-CM | POA: Diagnosis not present

## 2016-12-31 DIAGNOSIS — L97512 Non-pressure chronic ulcer of other part of right foot with fat layer exposed: Secondary | ICD-10-CM | POA: Diagnosis not present

## 2016-12-31 DIAGNOSIS — F17210 Nicotine dependence, cigarettes, uncomplicated: Secondary | ICD-10-CM | POA: Diagnosis not present

## 2016-12-31 DIAGNOSIS — E11621 Type 2 diabetes mellitus with foot ulcer: Secondary | ICD-10-CM | POA: Diagnosis not present

## 2017-01-04 DIAGNOSIS — L97312 Non-pressure chronic ulcer of right ankle with fat layer exposed: Secondary | ICD-10-CM | POA: Diagnosis not present

## 2017-01-04 DIAGNOSIS — I743 Embolism and thrombosis of arteries of the lower extremities: Secondary | ICD-10-CM | POA: Diagnosis not present

## 2017-01-04 DIAGNOSIS — Z4800 Encounter for change or removal of nonsurgical wound dressing: Secondary | ICD-10-CM | POA: Diagnosis not present

## 2017-01-04 DIAGNOSIS — E11622 Type 2 diabetes mellitus with other skin ulcer: Secondary | ICD-10-CM | POA: Diagnosis not present

## 2017-01-04 DIAGNOSIS — L97812 Non-pressure chronic ulcer of other part of right lower leg with fat layer exposed: Secondary | ICD-10-CM | POA: Diagnosis not present

## 2017-01-04 DIAGNOSIS — E1151 Type 2 diabetes mellitus with diabetic peripheral angiopathy without gangrene: Secondary | ICD-10-CM | POA: Diagnosis not present

## 2017-01-05 ENCOUNTER — Encounter: Payer: Self-pay | Admitting: Surgery

## 2017-01-06 ENCOUNTER — Other Ambulatory Visit: Payer: Self-pay | Admitting: *Deleted

## 2017-01-06 DIAGNOSIS — I739 Peripheral vascular disease, unspecified: Secondary | ICD-10-CM

## 2017-01-06 DIAGNOSIS — Z9862 Peripheral vascular angioplasty status: Secondary | ICD-10-CM

## 2017-01-07 DIAGNOSIS — E1151 Type 2 diabetes mellitus with diabetic peripheral angiopathy without gangrene: Secondary | ICD-10-CM | POA: Diagnosis not present

## 2017-01-07 DIAGNOSIS — L97312 Non-pressure chronic ulcer of right ankle with fat layer exposed: Secondary | ICD-10-CM | POA: Diagnosis not present

## 2017-01-07 DIAGNOSIS — E11622 Type 2 diabetes mellitus with other skin ulcer: Secondary | ICD-10-CM | POA: Diagnosis not present

## 2017-01-07 DIAGNOSIS — L97213 Non-pressure chronic ulcer of right calf with necrosis of muscle: Secondary | ICD-10-CM | POA: Diagnosis not present

## 2017-01-07 DIAGNOSIS — E11621 Type 2 diabetes mellitus with foot ulcer: Secondary | ICD-10-CM | POA: Diagnosis not present

## 2017-01-07 DIAGNOSIS — L97221 Non-pressure chronic ulcer of left calf limited to breakdown of skin: Secondary | ICD-10-CM | POA: Diagnosis not present

## 2017-01-08 ENCOUNTER — Ambulatory Visit (HOSPITAL_COMMUNITY)
Admission: RE | Admit: 2017-01-08 | Discharge: 2017-01-08 | Disposition: A | Discharge status: Home / Self Care | Payer: Medicare Other | Source: Ambulatory Visit | Attending: Vascular Surgery | Admitting: Vascular Surgery

## 2017-01-08 ENCOUNTER — Encounter (HOSPITAL_COMMUNITY): Payer: Self-pay

## 2017-01-08 DIAGNOSIS — I739 Peripheral vascular disease, unspecified: Secondary | ICD-10-CM | POA: Diagnosis not present

## 2017-01-08 DIAGNOSIS — Z9862 Peripheral vascular angioplasty status: Secondary | ICD-10-CM

## 2017-01-11 ENCOUNTER — Ambulatory Visit (HOSPITAL_COMMUNITY)
Admission: RE | Admit: 2017-01-11 | Discharge: 2017-01-11 | Disposition: A | Discharge status: Home / Self Care | Payer: Medicare Other | Source: Ambulatory Visit | Attending: Surgery | Admitting: Surgery

## 2017-01-11 ENCOUNTER — Ambulatory Visit (INDEPENDENT_AMBULATORY_CARE_PROVIDER_SITE_OTHER): Payer: Medicare Other | Admitting: Surgery

## 2017-01-11 ENCOUNTER — Encounter: Payer: Self-pay | Admitting: Surgery

## 2017-01-11 VITALS — BP 136/73 | HR 97 | Temp 96.9°F | Resp 16 | Ht 61.0 in | Wt 127.0 lb

## 2017-01-11 DIAGNOSIS — I70229 Atherosclerosis of native arteries of extremities with rest pain, unspecified extremity: Secondary | ICD-10-CM

## 2017-01-11 DIAGNOSIS — I779 Disorder of arteries and arterioles, unspecified: Secondary | ICD-10-CM | POA: Diagnosis not present

## 2017-01-11 DIAGNOSIS — I739 Peripheral vascular disease, unspecified: Secondary | ICD-10-CM | POA: Insufficient documentation

## 2017-01-11 DIAGNOSIS — Z9862 Peripheral vascular angioplasty status: Secondary | ICD-10-CM | POA: Diagnosis not present

## 2017-01-11 DIAGNOSIS — I70233 Atherosclerosis of native arteries of right leg with ulceration of ankle: Secondary | ICD-10-CM

## 2017-01-11 DIAGNOSIS — I998 Other disorder of circulatory system: Secondary | ICD-10-CM

## 2017-01-11 NOTE — Progress Notes (Signed)
POST OPERATIVE OFFICE NOTE    CC:  S/P This is a 66 y.o. male who is s/p  Procedure Performed: 12/08/2016 1. Ultrasound-guided access, left femoral artery 2. Abdominal aortogram 3. Right lower extremity runoff 4. Stent, right superficial femoral/popliteal artery 5. Failed balloon angioplasty, right posterior tibial artery  He states he is able to sleep in his own bed 7-8 hours at a time.  He is very pleased with his surgical out comes so far.  He is followed by the wound center and they have placed Apligrafts over the wounds.  He reports no medication changes and no new health issues since he was last seen.  He continues to take a daily Plavix and statin.    HPI:  Reginald Ayers a 66 y.o.male, who is Referred to me for a second opinion regarding ulcers on his right leg. He states they have been present for approximately 8 months. He has a duplex of the right leg would suggest popliteal occlusion as well as tibial vessel disease. His most recent ABI was 0.47 on the right. The patient currently is [facility]. The patient denies any real claudication symptoms.  The patient has a history of carotid occlusive disease. He has a chronic occlusion of the left internal carotid artery and 60-79% right carotid stenosis. The last imaging study was one year ago. He has a history of smoking.   Allergies  Allergen Reactions  . Actos [Pioglitazone] Shortness Of Breath    Leg swelling   . Lisinopril Cough    Current Outpatient Prescriptions  Medication Sig Dispense Refill  . albuterol (PROVENTIL HFA;VENTOLIN HFA) 108 (90 Base) MCG/ACT inhaler Inhale 2 puffs into the lungs every 4 (four) hours as needed for wheezing or shortness of breath.     Marland Kitchen albuterol (PROVENTIL) (2.5 MG/3ML) 0.083% nebulizer solution Inhale 3 mLs into the lungs every 3 (three) hours as needed for shortness  of breath.   1  . aspirin EC 325 MG tablet Take 325 mg by mouth daily. May take an additional 650mg s as needed for pain    . clopidogrel (PLAVIX) 75 MG tablet Take 75 mg by mouth daily.     . Cyanocobalamin (VITAMIN B-12 IJ) Inject 1 Dose as directed every 30 (thirty) days.     Marland Kitchen dextromethorphan-guaiFENesin (MUCINEX DM) 30-600 MG 12hr tablet Take 1 tablet by mouth 2 (two) times daily as needed for cough.    . furosemide (LASIX) 40 MG tablet Take 40 mg by mouth daily as needed for edema.    Marland Kitchen glipiZIDE (GLUCOTROL XL) 10 MG 24 hr tablet Take 10 mg by mouth 2 (two) times daily.     . Homeopathic Products (LEG CRAMP RELIEF SL) Place 2 tablets under the tongue 2 (two) times daily as needed (leg cramps).    Marland Kitchen ibuprofen (ADVIL,MOTRIN) 200 MG tablet Take 800 mg by mouth every 8 (eight) hours as needed for moderate pain.    Marland Kitchen irbesartan (AVAPRO) 300 MG tablet Take 300 mg by mouth daily.     . Lidocaine-Glycerin (PREPARATION H RE) Place 1 application rectally daily as needed (hemorrhoids).    . metFORMIN (GLUCOPHAGE) 500 MG tablet Take 1,000 mg by mouth 2 (two) times daily with a meal.     . NITROSTAT 0.4 MG SL tablet Take 0.4 mg by mouth every 5 (five) minutes as needed for chest pain.   0  . Polyvinyl Alcohol-Povidone (REFRESH OP) Apply 1 drop to  eye 2 (two) times daily as needed (dry eyes).    . Pseudoeph-Doxylamine-DM-APAP (NYQUIL PO) Take 1 Dose by mouth at bedtime.     . ranitidine (ZANTAC) 150 MG tablet Take 150 mg by mouth daily.    . simvastatin (ZOCOR) 40 MG tablet Take 40 mg by mouth every evening.    Marland Kitchen. SPIRIVA HANDIHALER 18 MCG inhalation capsule Place 18 mcg into inhaler and inhale daily.     . traMADol (ULTRAM) 50 MG tablet Take 50 mg by mouth 3 (three) times daily as needed for severe pain.     . vitamin B-12 (CYANOCOBALAMIN) 1000 MCG tablet Take 1,000 mcg by mouth daily as needed (tingling).    . cephALEXin (KEFLEX) 500 MG capsule Take 500 mg by mouth 4 (four) times daily.  0   No  current facility-administered medications for this visit.      ROS:  See HPI  Physical Exam:  Vitals:   01/11/17 1333 01/11/17 1335  BP: (!) 168/82 136/73  Pulse: 97 97  Resp: 16   Temp: (!) 96.9 F (36.1 C)     Incision:  Left groin is well healed without hematoma Extremities:  Right LE with good cap refill, active range of motion is intact, and skin around graft sites appears health and intact. Lungs non labored breathing Herat RRR  ABI's today 01/11/2017 Patent SFA stent s/p arthrectomy with angioplasty of the right PTA 12/08/2016 Biphasic flow with right 0.95 Monophasic flow 0.45 on the left   Assessment/Plan:  This is a 66 y.o. male who is s/p: Procedure Performed: 12/08/2016 1. Ultrasound-guided access, left femoral artery 2. Abdominal aortogram 3. Right lower extremity runoff 4. Stent, right superficial femoral/popliteal artery 5. Failed balloon angioplasty, right posterior tibial artery  With previous SFA stent. He is doing well since his procedure.  He is followed by The wound center for care with Apligraft.  He states his sleep has improved significantly.  He will f/u in 3 months for repeat ABI's, right LE arterial duplex and B carotid duplex.  He has a history of left ICA occlusion and 70 % stenosis on the right.   [name], Turner Kunzman [facility] HospitalMAUREEN PA-C [facility] [phone]  Clinic MD:  Myra GianottiBrabham  This is a 66 year old gentleman who developed a right foot wound.  He has a history of right superficial femoral artery stents placed in the remote past.  On 09/22/2016 he went for angiography and I was able to recanalize his occluded stents and placed Viabahn stents.  The patient had severe stenosis of the posterior tibial artery which was not addressed at this time.  He did not heal his wound and therefore 12/08/2016 he went back for angiography and had interventional his posterior tibial artery.   The patient continued to get wound care at the wound center.  He has Apligraf in place.  Fluoroscopy vascular perspective he has adequate perfusion.  I have him scheduled for follow-up in 3 months.  He'll continue with wound care.  Durene CalWells Brabham  The patient was seen in conjunction with Dr. Myra GianottiBrabham

## 2017-01-12 NOTE — Addendum Note (Signed)
Addended by: Burton ApleyPETTY, Eleen Litz A on: 01/12/2017 09:22 AM   Modules accepted: Orders

## 2017-01-14 DIAGNOSIS — E1151 Type 2 diabetes mellitus with diabetic peripheral angiopathy without gangrene: Secondary | ICD-10-CM | POA: Diagnosis not present

## 2017-01-14 DIAGNOSIS — I743 Embolism and thrombosis of arteries of the lower extremities: Secondary | ICD-10-CM | POA: Diagnosis not present

## 2017-01-14 DIAGNOSIS — E11621 Type 2 diabetes mellitus with foot ulcer: Secondary | ICD-10-CM | POA: Diagnosis not present

## 2017-01-14 DIAGNOSIS — E11622 Type 2 diabetes mellitus with other skin ulcer: Secondary | ICD-10-CM | POA: Diagnosis not present

## 2017-01-14 DIAGNOSIS — L97221 Non-pressure chronic ulcer of left calf limited to breakdown of skin: Secondary | ICD-10-CM | POA: Diagnosis not present

## 2017-01-14 DIAGNOSIS — L97812 Non-pressure chronic ulcer of other part of right lower leg with fat layer exposed: Secondary | ICD-10-CM | POA: Diagnosis not present

## 2017-01-14 DIAGNOSIS — L97312 Non-pressure chronic ulcer of right ankle with fat layer exposed: Secondary | ICD-10-CM | POA: Diagnosis not present

## 2017-01-14 DIAGNOSIS — L97213 Non-pressure chronic ulcer of right calf with necrosis of muscle: Secondary | ICD-10-CM | POA: Diagnosis not present

## 2017-01-14 DIAGNOSIS — Z4800 Encounter for change or removal of nonsurgical wound dressing: Secondary | ICD-10-CM | POA: Diagnosis not present

## 2017-01-14 DIAGNOSIS — L97512 Non-pressure chronic ulcer of other part of right foot with fat layer exposed: Secondary | ICD-10-CM | POA: Diagnosis not present

## 2017-01-15 DIAGNOSIS — Z0001 Encounter for general adult medical examination with abnormal findings: Secondary | ICD-10-CM | POA: Diagnosis not present

## 2017-01-15 DIAGNOSIS — Z1389 Encounter for screening for other disorder: Secondary | ICD-10-CM | POA: Diagnosis not present

## 2017-01-15 DIAGNOSIS — I8310 Varicose veins of unspecified lower extremity with inflammation: Secondary | ICD-10-CM | POA: Diagnosis not present

## 2017-01-15 DIAGNOSIS — L97812 Non-pressure chronic ulcer of other part of right lower leg with fat layer exposed: Secondary | ICD-10-CM | POA: Diagnosis not present

## 2017-01-15 DIAGNOSIS — Z7982 Long term (current) use of aspirin: Secondary | ICD-10-CM | POA: Diagnosis not present

## 2017-01-15 DIAGNOSIS — I2510 Atherosclerotic heart disease of native coronary artery without angina pectoris: Secondary | ICD-10-CM | POA: Diagnosis not present

## 2017-01-15 DIAGNOSIS — L97312 Non-pressure chronic ulcer of right ankle with fat layer exposed: Secondary | ICD-10-CM | POA: Diagnosis not present

## 2017-01-15 DIAGNOSIS — I739 Peripheral vascular disease, unspecified: Secondary | ICD-10-CM | POA: Diagnosis not present

## 2017-01-15 DIAGNOSIS — J441 Chronic obstructive pulmonary disease with (acute) exacerbation: Secondary | ICD-10-CM | POA: Diagnosis not present

## 2017-01-15 DIAGNOSIS — D510 Vitamin B12 deficiency anemia due to intrinsic factor deficiency: Secondary | ICD-10-CM | POA: Diagnosis not present

## 2017-01-15 DIAGNOSIS — Z7984 Long term (current) use of oral hypoglycemic drugs: Secondary | ICD-10-CM | POA: Diagnosis not present

## 2017-01-15 DIAGNOSIS — E7800 Pure hypercholesterolemia, unspecified: Secondary | ICD-10-CM | POA: Diagnosis not present

## 2017-01-15 DIAGNOSIS — Z7902 Long term (current) use of antithrombotics/antiplatelets: Secondary | ICD-10-CM | POA: Diagnosis not present

## 2017-01-15 DIAGNOSIS — E11622 Type 2 diabetes mellitus with other skin ulcer: Secondary | ICD-10-CM | POA: Diagnosis not present

## 2017-01-15 DIAGNOSIS — Z4800 Encounter for change or removal of nonsurgical wound dressing: Secondary | ICD-10-CM | POA: Diagnosis not present

## 2017-01-15 DIAGNOSIS — J449 Chronic obstructive pulmonary disease, unspecified: Secondary | ICD-10-CM | POA: Diagnosis not present

## 2017-01-15 DIAGNOSIS — I10 Essential (primary) hypertension: Secondary | ICD-10-CM | POA: Diagnosis not present

## 2017-01-15 DIAGNOSIS — Z125 Encounter for screening for malignant neoplasm of prostate: Secondary | ICD-10-CM | POA: Diagnosis not present

## 2017-01-15 DIAGNOSIS — E11319 Type 2 diabetes mellitus with unspecified diabetic retinopathy without macular edema: Secondary | ICD-10-CM | POA: Diagnosis not present

## 2017-01-15 DIAGNOSIS — E1151 Type 2 diabetes mellitus with diabetic peripheral angiopathy without gangrene: Secondary | ICD-10-CM | POA: Diagnosis not present

## 2017-01-18 DIAGNOSIS — E1151 Type 2 diabetes mellitus with diabetic peripheral angiopathy without gangrene: Secondary | ICD-10-CM | POA: Diagnosis not present

## 2017-01-18 DIAGNOSIS — L97312 Non-pressure chronic ulcer of right ankle with fat layer exposed: Secondary | ICD-10-CM | POA: Diagnosis not present

## 2017-01-18 DIAGNOSIS — L97812 Non-pressure chronic ulcer of other part of right lower leg with fat layer exposed: Secondary | ICD-10-CM | POA: Diagnosis not present

## 2017-01-18 DIAGNOSIS — Z4800 Encounter for change or removal of nonsurgical wound dressing: Secondary | ICD-10-CM | POA: Diagnosis not present

## 2017-01-18 DIAGNOSIS — E11622 Type 2 diabetes mellitus with other skin ulcer: Secondary | ICD-10-CM | POA: Diagnosis not present

## 2017-01-18 DIAGNOSIS — Z7984 Long term (current) use of oral hypoglycemic drugs: Secondary | ICD-10-CM | POA: Diagnosis not present

## 2017-01-21 DIAGNOSIS — E11621 Type 2 diabetes mellitus with foot ulcer: Secondary | ICD-10-CM | POA: Diagnosis not present

## 2017-01-21 DIAGNOSIS — E11622 Type 2 diabetes mellitus with other skin ulcer: Secondary | ICD-10-CM | POA: Diagnosis not present

## 2017-01-21 DIAGNOSIS — L97221 Non-pressure chronic ulcer of left calf limited to breakdown of skin: Secondary | ICD-10-CM | POA: Diagnosis not present

## 2017-01-21 DIAGNOSIS — L97312 Non-pressure chronic ulcer of right ankle with fat layer exposed: Secondary | ICD-10-CM | POA: Diagnosis not present

## 2017-01-21 DIAGNOSIS — L97213 Non-pressure chronic ulcer of right calf with necrosis of muscle: Secondary | ICD-10-CM | POA: Diagnosis not present

## 2017-01-21 DIAGNOSIS — E1151 Type 2 diabetes mellitus with diabetic peripheral angiopathy without gangrene: Secondary | ICD-10-CM | POA: Diagnosis not present

## 2017-01-25 DIAGNOSIS — L97812 Non-pressure chronic ulcer of other part of right lower leg with fat layer exposed: Secondary | ICD-10-CM | POA: Diagnosis not present

## 2017-01-25 DIAGNOSIS — Z4800 Encounter for change or removal of nonsurgical wound dressing: Secondary | ICD-10-CM | POA: Diagnosis not present

## 2017-01-25 DIAGNOSIS — L97312 Non-pressure chronic ulcer of right ankle with fat layer exposed: Secondary | ICD-10-CM | POA: Diagnosis not present

## 2017-01-25 DIAGNOSIS — E11622 Type 2 diabetes mellitus with other skin ulcer: Secondary | ICD-10-CM | POA: Diagnosis not present

## 2017-01-25 DIAGNOSIS — Z7984 Long term (current) use of oral hypoglycemic drugs: Secondary | ICD-10-CM | POA: Diagnosis not present

## 2017-01-25 DIAGNOSIS — E1151 Type 2 diabetes mellitus with diabetic peripheral angiopathy without gangrene: Secondary | ICD-10-CM | POA: Diagnosis not present

## 2017-01-28 ENCOUNTER — Encounter (HOSPITAL_BASED_OUTPATIENT_CLINIC_OR_DEPARTMENT_OTHER): Payer: Medicare Other | Attending: Internal Medicine

## 2017-01-28 DIAGNOSIS — L97512 Non-pressure chronic ulcer of other part of right foot with fat layer exposed: Secondary | ICD-10-CM | POA: Diagnosis not present

## 2017-01-28 DIAGNOSIS — I2510 Atherosclerotic heart disease of native coronary artery without angina pectoris: Secondary | ICD-10-CM | POA: Insufficient documentation

## 2017-01-28 DIAGNOSIS — I10 Essential (primary) hypertension: Secondary | ICD-10-CM | POA: Insufficient documentation

## 2017-01-28 DIAGNOSIS — Z9221 Personal history of antineoplastic chemotherapy: Secondary | ICD-10-CM | POA: Diagnosis not present

## 2017-01-28 DIAGNOSIS — E11621 Type 2 diabetes mellitus with foot ulcer: Secondary | ICD-10-CM | POA: Diagnosis not present

## 2017-01-28 DIAGNOSIS — L97812 Non-pressure chronic ulcer of other part of right lower leg with fat layer exposed: Secondary | ICD-10-CM | POA: Diagnosis not present

## 2017-01-28 DIAGNOSIS — J449 Chronic obstructive pulmonary disease, unspecified: Secondary | ICD-10-CM | POA: Insufficient documentation

## 2017-01-28 DIAGNOSIS — E11622 Type 2 diabetes mellitus with other skin ulcer: Secondary | ICD-10-CM | POA: Diagnosis not present

## 2017-02-01 DIAGNOSIS — L97812 Non-pressure chronic ulcer of other part of right lower leg with fat layer exposed: Secondary | ICD-10-CM | POA: Diagnosis not present

## 2017-02-01 DIAGNOSIS — L97312 Non-pressure chronic ulcer of right ankle with fat layer exposed: Secondary | ICD-10-CM | POA: Diagnosis not present

## 2017-02-01 DIAGNOSIS — Z7984 Long term (current) use of oral hypoglycemic drugs: Secondary | ICD-10-CM | POA: Diagnosis not present

## 2017-02-01 DIAGNOSIS — E11622 Type 2 diabetes mellitus with other skin ulcer: Secondary | ICD-10-CM | POA: Diagnosis not present

## 2017-02-01 DIAGNOSIS — E1151 Type 2 diabetes mellitus with diabetic peripheral angiopathy without gangrene: Secondary | ICD-10-CM | POA: Diagnosis not present

## 2017-02-01 DIAGNOSIS — Z4800 Encounter for change or removal of nonsurgical wound dressing: Secondary | ICD-10-CM | POA: Diagnosis not present

## 2017-02-02 DIAGNOSIS — Z7984 Long term (current) use of oral hypoglycemic drugs: Secondary | ICD-10-CM | POA: Diagnosis not present

## 2017-02-02 DIAGNOSIS — E11622 Type 2 diabetes mellitus with other skin ulcer: Secondary | ICD-10-CM | POA: Diagnosis not present

## 2017-02-02 DIAGNOSIS — Z4800 Encounter for change or removal of nonsurgical wound dressing: Secondary | ICD-10-CM | POA: Diagnosis not present

## 2017-02-02 DIAGNOSIS — L97812 Non-pressure chronic ulcer of other part of right lower leg with fat layer exposed: Secondary | ICD-10-CM | POA: Diagnosis not present

## 2017-02-02 DIAGNOSIS — E1151 Type 2 diabetes mellitus with diabetic peripheral angiopathy without gangrene: Secondary | ICD-10-CM | POA: Diagnosis not present

## 2017-02-02 DIAGNOSIS — L97312 Non-pressure chronic ulcer of right ankle with fat layer exposed: Secondary | ICD-10-CM | POA: Diagnosis not present

## 2017-02-08 DIAGNOSIS — E1151 Type 2 diabetes mellitus with diabetic peripheral angiopathy without gangrene: Secondary | ICD-10-CM | POA: Diagnosis not present

## 2017-02-08 DIAGNOSIS — L97812 Non-pressure chronic ulcer of other part of right lower leg with fat layer exposed: Secondary | ICD-10-CM | POA: Diagnosis not present

## 2017-02-08 DIAGNOSIS — L97312 Non-pressure chronic ulcer of right ankle with fat layer exposed: Secondary | ICD-10-CM | POA: Diagnosis not present

## 2017-02-08 DIAGNOSIS — Z7984 Long term (current) use of oral hypoglycemic drugs: Secondary | ICD-10-CM | POA: Diagnosis not present

## 2017-02-08 DIAGNOSIS — Z4800 Encounter for change or removal of nonsurgical wound dressing: Secondary | ICD-10-CM | POA: Diagnosis not present

## 2017-02-08 DIAGNOSIS — E11622 Type 2 diabetes mellitus with other skin ulcer: Secondary | ICD-10-CM | POA: Diagnosis not present

## 2017-02-11 DIAGNOSIS — I2510 Atherosclerotic heart disease of native coronary artery without angina pectoris: Secondary | ICD-10-CM | POA: Diagnosis not present

## 2017-02-11 DIAGNOSIS — E11622 Type 2 diabetes mellitus with other skin ulcer: Secondary | ICD-10-CM | POA: Diagnosis not present

## 2017-02-11 DIAGNOSIS — J449 Chronic obstructive pulmonary disease, unspecified: Secondary | ICD-10-CM | POA: Diagnosis not present

## 2017-02-11 DIAGNOSIS — E11621 Type 2 diabetes mellitus with foot ulcer: Secondary | ICD-10-CM | POA: Diagnosis not present

## 2017-02-11 DIAGNOSIS — L97512 Non-pressure chronic ulcer of other part of right foot with fat layer exposed: Secondary | ICD-10-CM | POA: Diagnosis not present

## 2017-02-11 DIAGNOSIS — L97212 Non-pressure chronic ulcer of right calf with fat layer exposed: Secondary | ICD-10-CM | POA: Diagnosis not present

## 2017-02-11 DIAGNOSIS — I10 Essential (primary) hypertension: Secondary | ICD-10-CM | POA: Diagnosis not present

## 2017-02-11 DIAGNOSIS — L97812 Non-pressure chronic ulcer of other part of right lower leg with fat layer exposed: Secondary | ICD-10-CM | POA: Diagnosis not present

## 2017-02-15 DIAGNOSIS — Z4800 Encounter for change or removal of nonsurgical wound dressing: Secondary | ICD-10-CM | POA: Diagnosis not present

## 2017-02-15 DIAGNOSIS — L97312 Non-pressure chronic ulcer of right ankle with fat layer exposed: Secondary | ICD-10-CM | POA: Diagnosis not present

## 2017-02-15 DIAGNOSIS — E11622 Type 2 diabetes mellitus with other skin ulcer: Secondary | ICD-10-CM | POA: Diagnosis not present

## 2017-02-15 DIAGNOSIS — E1151 Type 2 diabetes mellitus with diabetic peripheral angiopathy without gangrene: Secondary | ICD-10-CM | POA: Diagnosis not present

## 2017-02-15 DIAGNOSIS — Z7984 Long term (current) use of oral hypoglycemic drugs: Secondary | ICD-10-CM | POA: Diagnosis not present

## 2017-02-15 DIAGNOSIS — L97812 Non-pressure chronic ulcer of other part of right lower leg with fat layer exposed: Secondary | ICD-10-CM | POA: Diagnosis not present

## 2017-02-16 DIAGNOSIS — D510 Vitamin B12 deficiency anemia due to intrinsic factor deficiency: Secondary | ICD-10-CM | POA: Diagnosis not present

## 2017-02-18 DIAGNOSIS — E11622 Type 2 diabetes mellitus with other skin ulcer: Secondary | ICD-10-CM | POA: Diagnosis not present

## 2017-02-18 DIAGNOSIS — I2510 Atherosclerotic heart disease of native coronary artery without angina pectoris: Secondary | ICD-10-CM | POA: Diagnosis not present

## 2017-02-18 DIAGNOSIS — L97512 Non-pressure chronic ulcer of other part of right foot with fat layer exposed: Secondary | ICD-10-CM | POA: Diagnosis not present

## 2017-02-18 DIAGNOSIS — J449 Chronic obstructive pulmonary disease, unspecified: Secondary | ICD-10-CM | POA: Diagnosis not present

## 2017-02-18 DIAGNOSIS — I10 Essential (primary) hypertension: Secondary | ICD-10-CM | POA: Diagnosis not present

## 2017-02-18 DIAGNOSIS — L97812 Non-pressure chronic ulcer of other part of right lower leg with fat layer exposed: Secondary | ICD-10-CM | POA: Diagnosis not present

## 2017-02-22 DIAGNOSIS — E11622 Type 2 diabetes mellitus with other skin ulcer: Secondary | ICD-10-CM | POA: Diagnosis not present

## 2017-02-22 DIAGNOSIS — L97312 Non-pressure chronic ulcer of right ankle with fat layer exposed: Secondary | ICD-10-CM | POA: Diagnosis not present

## 2017-02-22 DIAGNOSIS — L97812 Non-pressure chronic ulcer of other part of right lower leg with fat layer exposed: Secondary | ICD-10-CM | POA: Diagnosis not present

## 2017-02-22 DIAGNOSIS — Z4800 Encounter for change or removal of nonsurgical wound dressing: Secondary | ICD-10-CM | POA: Diagnosis not present

## 2017-02-22 DIAGNOSIS — E1151 Type 2 diabetes mellitus with diabetic peripheral angiopathy without gangrene: Secondary | ICD-10-CM | POA: Diagnosis not present

## 2017-02-22 DIAGNOSIS — Z7984 Long term (current) use of oral hypoglycemic drugs: Secondary | ICD-10-CM | POA: Diagnosis not present

## 2017-02-25 ENCOUNTER — Encounter (HOSPITAL_BASED_OUTPATIENT_CLINIC_OR_DEPARTMENT_OTHER): Payer: Medicare Other | Attending: Internal Medicine

## 2017-02-25 DIAGNOSIS — L97812 Non-pressure chronic ulcer of other part of right lower leg with fat layer exposed: Secondary | ICD-10-CM | POA: Diagnosis not present

## 2017-02-25 DIAGNOSIS — Z9221 Personal history of antineoplastic chemotherapy: Secondary | ICD-10-CM | POA: Diagnosis not present

## 2017-02-25 DIAGNOSIS — L97512 Non-pressure chronic ulcer of other part of right foot with fat layer exposed: Secondary | ICD-10-CM | POA: Insufficient documentation

## 2017-02-25 DIAGNOSIS — E1151 Type 2 diabetes mellitus with diabetic peripheral angiopathy without gangrene: Secondary | ICD-10-CM | POA: Insufficient documentation

## 2017-02-25 DIAGNOSIS — E11622 Type 2 diabetes mellitus with other skin ulcer: Secondary | ICD-10-CM | POA: Insufficient documentation

## 2017-02-25 DIAGNOSIS — J449 Chronic obstructive pulmonary disease, unspecified: Secondary | ICD-10-CM | POA: Insufficient documentation

## 2017-02-25 DIAGNOSIS — I2510 Atherosclerotic heart disease of native coronary artery without angina pectoris: Secondary | ICD-10-CM | POA: Insufficient documentation

## 2017-02-25 DIAGNOSIS — E11621 Type 2 diabetes mellitus with foot ulcer: Secondary | ICD-10-CM | POA: Diagnosis not present

## 2017-03-02 DIAGNOSIS — L97812 Non-pressure chronic ulcer of other part of right lower leg with fat layer exposed: Secondary | ICD-10-CM | POA: Diagnosis not present

## 2017-03-02 DIAGNOSIS — Z7984 Long term (current) use of oral hypoglycemic drugs: Secondary | ICD-10-CM | POA: Diagnosis not present

## 2017-03-02 DIAGNOSIS — L97312 Non-pressure chronic ulcer of right ankle with fat layer exposed: Secondary | ICD-10-CM | POA: Diagnosis not present

## 2017-03-02 DIAGNOSIS — E1151 Type 2 diabetes mellitus with diabetic peripheral angiopathy without gangrene: Secondary | ICD-10-CM | POA: Diagnosis not present

## 2017-03-02 DIAGNOSIS — E11622 Type 2 diabetes mellitus with other skin ulcer: Secondary | ICD-10-CM | POA: Diagnosis not present

## 2017-03-02 DIAGNOSIS — Z4800 Encounter for change or removal of nonsurgical wound dressing: Secondary | ICD-10-CM | POA: Diagnosis not present

## 2017-03-04 DIAGNOSIS — J449 Chronic obstructive pulmonary disease, unspecified: Secondary | ICD-10-CM | POA: Diagnosis not present

## 2017-03-04 DIAGNOSIS — E11621 Type 2 diabetes mellitus with foot ulcer: Secondary | ICD-10-CM | POA: Diagnosis not present

## 2017-03-04 DIAGNOSIS — L97812 Non-pressure chronic ulcer of other part of right lower leg with fat layer exposed: Secondary | ICD-10-CM | POA: Diagnosis not present

## 2017-03-04 DIAGNOSIS — E11622 Type 2 diabetes mellitus with other skin ulcer: Secondary | ICD-10-CM | POA: Diagnosis not present

## 2017-03-04 DIAGNOSIS — L97512 Non-pressure chronic ulcer of other part of right foot with fat layer exposed: Secondary | ICD-10-CM | POA: Diagnosis not present

## 2017-03-04 DIAGNOSIS — E1151 Type 2 diabetes mellitus with diabetic peripheral angiopathy without gangrene: Secondary | ICD-10-CM | POA: Diagnosis not present

## 2017-03-08 DIAGNOSIS — Z7984 Long term (current) use of oral hypoglycemic drugs: Secondary | ICD-10-CM | POA: Diagnosis not present

## 2017-03-08 DIAGNOSIS — Z4800 Encounter for change or removal of nonsurgical wound dressing: Secondary | ICD-10-CM | POA: Diagnosis not present

## 2017-03-08 DIAGNOSIS — E11622 Type 2 diabetes mellitus with other skin ulcer: Secondary | ICD-10-CM | POA: Diagnosis not present

## 2017-03-08 DIAGNOSIS — E1151 Type 2 diabetes mellitus with diabetic peripheral angiopathy without gangrene: Secondary | ICD-10-CM | POA: Diagnosis not present

## 2017-03-08 DIAGNOSIS — L97812 Non-pressure chronic ulcer of other part of right lower leg with fat layer exposed: Secondary | ICD-10-CM | POA: Diagnosis not present

## 2017-03-08 DIAGNOSIS — L97312 Non-pressure chronic ulcer of right ankle with fat layer exposed: Secondary | ICD-10-CM | POA: Diagnosis not present

## 2017-03-09 DIAGNOSIS — E119 Type 2 diabetes mellitus without complications: Secondary | ICD-10-CM | POA: Diagnosis not present

## 2017-03-09 DIAGNOSIS — H18419 Arcus senilis, unspecified eye: Secondary | ICD-10-CM | POA: Diagnosis not present

## 2017-03-11 DIAGNOSIS — L97512 Non-pressure chronic ulcer of other part of right foot with fat layer exposed: Secondary | ICD-10-CM | POA: Diagnosis not present

## 2017-03-11 DIAGNOSIS — E11622 Type 2 diabetes mellitus with other skin ulcer: Secondary | ICD-10-CM | POA: Diagnosis not present

## 2017-03-11 DIAGNOSIS — L97812 Non-pressure chronic ulcer of other part of right lower leg with fat layer exposed: Secondary | ICD-10-CM | POA: Diagnosis not present

## 2017-03-11 DIAGNOSIS — J449 Chronic obstructive pulmonary disease, unspecified: Secondary | ICD-10-CM | POA: Diagnosis not present

## 2017-03-11 DIAGNOSIS — E1151 Type 2 diabetes mellitus with diabetic peripheral angiopathy without gangrene: Secondary | ICD-10-CM | POA: Diagnosis not present

## 2017-03-11 DIAGNOSIS — E11621 Type 2 diabetes mellitus with foot ulcer: Secondary | ICD-10-CM | POA: Diagnosis not present

## 2017-03-15 DIAGNOSIS — Z4800 Encounter for change or removal of nonsurgical wound dressing: Secondary | ICD-10-CM | POA: Diagnosis not present

## 2017-03-15 DIAGNOSIS — E11622 Type 2 diabetes mellitus with other skin ulcer: Secondary | ICD-10-CM | POA: Diagnosis not present

## 2017-03-15 DIAGNOSIS — L97812 Non-pressure chronic ulcer of other part of right lower leg with fat layer exposed: Secondary | ICD-10-CM | POA: Diagnosis not present

## 2017-03-15 DIAGNOSIS — L97312 Non-pressure chronic ulcer of right ankle with fat layer exposed: Secondary | ICD-10-CM | POA: Diagnosis not present

## 2017-03-15 DIAGNOSIS — E1151 Type 2 diabetes mellitus with diabetic peripheral angiopathy without gangrene: Secondary | ICD-10-CM | POA: Diagnosis not present

## 2017-03-15 DIAGNOSIS — Z7984 Long term (current) use of oral hypoglycemic drugs: Secondary | ICD-10-CM | POA: Diagnosis not present

## 2017-03-18 DIAGNOSIS — E1151 Type 2 diabetes mellitus with diabetic peripheral angiopathy without gangrene: Secondary | ICD-10-CM | POA: Diagnosis not present

## 2017-03-18 DIAGNOSIS — L97512 Non-pressure chronic ulcer of other part of right foot with fat layer exposed: Secondary | ICD-10-CM | POA: Diagnosis not present

## 2017-03-18 DIAGNOSIS — L97812 Non-pressure chronic ulcer of other part of right lower leg with fat layer exposed: Secondary | ICD-10-CM | POA: Diagnosis not present

## 2017-03-18 DIAGNOSIS — J449 Chronic obstructive pulmonary disease, unspecified: Secondary | ICD-10-CM | POA: Diagnosis not present

## 2017-03-18 DIAGNOSIS — E11622 Type 2 diabetes mellitus with other skin ulcer: Secondary | ICD-10-CM | POA: Diagnosis not present

## 2017-03-18 DIAGNOSIS — E11621 Type 2 diabetes mellitus with foot ulcer: Secondary | ICD-10-CM | POA: Diagnosis not present

## 2017-03-19 DIAGNOSIS — D510 Vitamin B12 deficiency anemia due to intrinsic factor deficiency: Secondary | ICD-10-CM | POA: Diagnosis not present

## 2017-03-22 DIAGNOSIS — E1151 Type 2 diabetes mellitus with diabetic peripheral angiopathy without gangrene: Secondary | ICD-10-CM | POA: Diagnosis not present

## 2017-03-22 DIAGNOSIS — Z4800 Encounter for change or removal of nonsurgical wound dressing: Secondary | ICD-10-CM | POA: Diagnosis not present

## 2017-03-22 DIAGNOSIS — L97812 Non-pressure chronic ulcer of other part of right lower leg with fat layer exposed: Secondary | ICD-10-CM | POA: Diagnosis not present

## 2017-03-22 DIAGNOSIS — E11622 Type 2 diabetes mellitus with other skin ulcer: Secondary | ICD-10-CM | POA: Diagnosis not present

## 2017-03-22 DIAGNOSIS — Z7902 Long term (current) use of antithrombotics/antiplatelets: Secondary | ICD-10-CM | POA: Diagnosis not present

## 2017-03-22 DIAGNOSIS — Z7984 Long term (current) use of oral hypoglycemic drugs: Secondary | ICD-10-CM | POA: Diagnosis not present

## 2017-03-22 DIAGNOSIS — L97312 Non-pressure chronic ulcer of right ankle with fat layer exposed: Secondary | ICD-10-CM | POA: Diagnosis not present

## 2017-03-25 ENCOUNTER — Encounter (HOSPITAL_BASED_OUTPATIENT_CLINIC_OR_DEPARTMENT_OTHER): Payer: Medicare Other | Attending: Internal Medicine

## 2017-03-25 DIAGNOSIS — I2510 Atherosclerotic heart disease of native coronary artery without angina pectoris: Secondary | ICD-10-CM | POA: Insufficient documentation

## 2017-03-25 DIAGNOSIS — Z95820 Peripheral vascular angioplasty status with implants and grafts: Secondary | ICD-10-CM | POA: Diagnosis not present

## 2017-03-25 DIAGNOSIS — E11622 Type 2 diabetes mellitus with other skin ulcer: Secondary | ICD-10-CM | POA: Insufficient documentation

## 2017-03-25 DIAGNOSIS — Z9221 Personal history of antineoplastic chemotherapy: Secondary | ICD-10-CM | POA: Insufficient documentation

## 2017-03-25 DIAGNOSIS — J449 Chronic obstructive pulmonary disease, unspecified: Secondary | ICD-10-CM | POA: Insufficient documentation

## 2017-03-25 DIAGNOSIS — L97812 Non-pressure chronic ulcer of other part of right lower leg with fat layer exposed: Secondary | ICD-10-CM | POA: Insufficient documentation

## 2017-03-25 DIAGNOSIS — E11621 Type 2 diabetes mellitus with foot ulcer: Secondary | ICD-10-CM | POA: Insufficient documentation

## 2017-03-25 DIAGNOSIS — E1151 Type 2 diabetes mellitus with diabetic peripheral angiopathy without gangrene: Secondary | ICD-10-CM | POA: Insufficient documentation

## 2017-03-25 DIAGNOSIS — L97512 Non-pressure chronic ulcer of other part of right foot with fat layer exposed: Secondary | ICD-10-CM | POA: Diagnosis not present

## 2017-03-31 DIAGNOSIS — E11622 Type 2 diabetes mellitus with other skin ulcer: Secondary | ICD-10-CM | POA: Diagnosis not present

## 2017-03-31 DIAGNOSIS — Z7984 Long term (current) use of oral hypoglycemic drugs: Secondary | ICD-10-CM | POA: Diagnosis not present

## 2017-03-31 DIAGNOSIS — L97812 Non-pressure chronic ulcer of other part of right lower leg with fat layer exposed: Secondary | ICD-10-CM | POA: Diagnosis not present

## 2017-03-31 DIAGNOSIS — E1151 Type 2 diabetes mellitus with diabetic peripheral angiopathy without gangrene: Secondary | ICD-10-CM | POA: Diagnosis not present

## 2017-03-31 DIAGNOSIS — L97312 Non-pressure chronic ulcer of right ankle with fat layer exposed: Secondary | ICD-10-CM | POA: Diagnosis not present

## 2017-03-31 DIAGNOSIS — Z4800 Encounter for change or removal of nonsurgical wound dressing: Secondary | ICD-10-CM | POA: Diagnosis not present

## 2017-04-05 DIAGNOSIS — E1151 Type 2 diabetes mellitus with diabetic peripheral angiopathy without gangrene: Secondary | ICD-10-CM | POA: Diagnosis not present

## 2017-04-05 DIAGNOSIS — L97812 Non-pressure chronic ulcer of other part of right lower leg with fat layer exposed: Secondary | ICD-10-CM | POA: Diagnosis not present

## 2017-04-05 DIAGNOSIS — L97312 Non-pressure chronic ulcer of right ankle with fat layer exposed: Secondary | ICD-10-CM | POA: Diagnosis not present

## 2017-04-05 DIAGNOSIS — Z4800 Encounter for change or removal of nonsurgical wound dressing: Secondary | ICD-10-CM | POA: Diagnosis not present

## 2017-04-05 DIAGNOSIS — E11622 Type 2 diabetes mellitus with other skin ulcer: Secondary | ICD-10-CM | POA: Diagnosis not present

## 2017-04-05 DIAGNOSIS — Z7984 Long term (current) use of oral hypoglycemic drugs: Secondary | ICD-10-CM | POA: Diagnosis not present

## 2017-04-06 DIAGNOSIS — Z7984 Long term (current) use of oral hypoglycemic drugs: Secondary | ICD-10-CM | POA: Diagnosis not present

## 2017-04-06 DIAGNOSIS — E1151 Type 2 diabetes mellitus with diabetic peripheral angiopathy without gangrene: Secondary | ICD-10-CM | POA: Diagnosis not present

## 2017-04-06 DIAGNOSIS — Z4800 Encounter for change or removal of nonsurgical wound dressing: Secondary | ICD-10-CM | POA: Diagnosis not present

## 2017-04-06 DIAGNOSIS — L97812 Non-pressure chronic ulcer of other part of right lower leg with fat layer exposed: Secondary | ICD-10-CM | POA: Diagnosis not present

## 2017-04-06 DIAGNOSIS — L97312 Non-pressure chronic ulcer of right ankle with fat layer exposed: Secondary | ICD-10-CM | POA: Diagnosis not present

## 2017-04-06 DIAGNOSIS — E11622 Type 2 diabetes mellitus with other skin ulcer: Secondary | ICD-10-CM | POA: Diagnosis not present

## 2017-04-07 ENCOUNTER — Encounter: Payer: Self-pay | Admitting: Surgery

## 2017-04-08 DIAGNOSIS — J449 Chronic obstructive pulmonary disease, unspecified: Secondary | ICD-10-CM | POA: Diagnosis not present

## 2017-04-08 DIAGNOSIS — L97512 Non-pressure chronic ulcer of other part of right foot with fat layer exposed: Secondary | ICD-10-CM | POA: Diagnosis not present

## 2017-04-08 DIAGNOSIS — E11621 Type 2 diabetes mellitus with foot ulcer: Secondary | ICD-10-CM | POA: Diagnosis not present

## 2017-04-08 DIAGNOSIS — E11622 Type 2 diabetes mellitus with other skin ulcer: Secondary | ICD-10-CM | POA: Diagnosis not present

## 2017-04-08 DIAGNOSIS — E1151 Type 2 diabetes mellitus with diabetic peripheral angiopathy without gangrene: Secondary | ICD-10-CM | POA: Diagnosis not present

## 2017-04-08 DIAGNOSIS — L97812 Non-pressure chronic ulcer of other part of right lower leg with fat layer exposed: Secondary | ICD-10-CM | POA: Diagnosis not present

## 2017-04-13 DIAGNOSIS — E1151 Type 2 diabetes mellitus with diabetic peripheral angiopathy without gangrene: Secondary | ICD-10-CM | POA: Diagnosis not present

## 2017-04-13 DIAGNOSIS — L97812 Non-pressure chronic ulcer of other part of right lower leg with fat layer exposed: Secondary | ICD-10-CM | POA: Diagnosis not present

## 2017-04-13 DIAGNOSIS — E11622 Type 2 diabetes mellitus with other skin ulcer: Secondary | ICD-10-CM | POA: Diagnosis not present

## 2017-04-13 DIAGNOSIS — Z7984 Long term (current) use of oral hypoglycemic drugs: Secondary | ICD-10-CM | POA: Diagnosis not present

## 2017-04-13 DIAGNOSIS — L97312 Non-pressure chronic ulcer of right ankle with fat layer exposed: Secondary | ICD-10-CM | POA: Diagnosis not present

## 2017-04-13 DIAGNOSIS — Z4800 Encounter for change or removal of nonsurgical wound dressing: Secondary | ICD-10-CM | POA: Diagnosis not present

## 2017-04-15 ENCOUNTER — Ambulatory Visit (HOSPITAL_COMMUNITY)
Admission: RE | Admit: 2017-04-15 | Discharge: 2017-04-15 | Disposition: A | Discharge status: Home / Self Care | Payer: Medicare Other | Source: Ambulatory Visit | Attending: Vascular Surgery | Admitting: Vascular Surgery

## 2017-04-15 ENCOUNTER — Ambulatory Visit: Payer: Medicare Other

## 2017-04-15 ENCOUNTER — Ambulatory Visit (INDEPENDENT_AMBULATORY_CARE_PROVIDER_SITE_OTHER)
Admission: RE | Admit: 2017-04-15 | Discharge: 2017-04-15 | Disposition: A | Discharge status: Home / Self Care | Payer: Medicare Other | Source: Ambulatory Visit | Attending: Vascular Surgery | Admitting: Vascular Surgery

## 2017-04-15 DIAGNOSIS — I739 Peripheral vascular disease, unspecified: Secondary | ICD-10-CM

## 2017-04-15 DIAGNOSIS — I70233 Atherosclerosis of native arteries of right leg with ulceration of ankle: Secondary | ICD-10-CM | POA: Diagnosis not present

## 2017-04-15 DIAGNOSIS — I779 Disorder of arteries and arterioles, unspecified: Secondary | ICD-10-CM

## 2017-04-15 DIAGNOSIS — I998 Other disorder of circulatory system: Secondary | ICD-10-CM | POA: Insufficient documentation

## 2017-04-15 DIAGNOSIS — I70229 Atherosclerosis of native arteries of extremities with rest pain, unspecified extremity: Secondary | ICD-10-CM

## 2017-04-15 LAB — VAS US CAROTID
LCCADDIAS: -8 cm/s
LCCADSYS: -52 cm/s
LEFT ECA DIAS: -28 cm/s
LEFT VERTEBRAL DIAS: -13 cm/s
Left CCA prox sys: 73 cm/s
Left ICA prox dias: 0 cm/s
Left ICA prox sys: 0 cm/s
RCCADSYS: -108 cm/s
RCCAPDIAS: 16 cm/s
RIGHT CCA MID DIAS: -14 cm/s
RIGHT ECA DIAS: -9 cm/s
RIGHT VERTEBRAL DIAS: 15 cm/s
Right CCA prox sys: 72 cm/s

## 2017-04-16 DIAGNOSIS — I2510 Atherosclerotic heart disease of native coronary artery without angina pectoris: Secondary | ICD-10-CM | POA: Diagnosis not present

## 2017-04-16 DIAGNOSIS — I739 Peripheral vascular disease, unspecified: Secondary | ICD-10-CM | POA: Diagnosis not present

## 2017-04-16 DIAGNOSIS — E11622 Type 2 diabetes mellitus with other skin ulcer: Secondary | ICD-10-CM | POA: Diagnosis not present

## 2017-04-16 DIAGNOSIS — J449 Chronic obstructive pulmonary disease, unspecified: Secondary | ICD-10-CM | POA: Diagnosis not present

## 2017-04-16 DIAGNOSIS — Z4800 Encounter for change or removal of nonsurgical wound dressing: Secondary | ICD-10-CM | POA: Diagnosis not present

## 2017-04-16 DIAGNOSIS — E7800 Pure hypercholesterolemia, unspecified: Secondary | ICD-10-CM | POA: Diagnosis not present

## 2017-04-16 DIAGNOSIS — E11319 Type 2 diabetes mellitus with unspecified diabetic retinopathy without macular edema: Secondary | ICD-10-CM | POA: Diagnosis not present

## 2017-04-16 DIAGNOSIS — Z7984 Long term (current) use of oral hypoglycemic drugs: Secondary | ICD-10-CM | POA: Diagnosis not present

## 2017-04-16 DIAGNOSIS — E1165 Type 2 diabetes mellitus with hyperglycemia: Secondary | ICD-10-CM | POA: Diagnosis not present

## 2017-04-16 DIAGNOSIS — L97312 Non-pressure chronic ulcer of right ankle with fat layer exposed: Secondary | ICD-10-CM | POA: Diagnosis not present

## 2017-04-16 DIAGNOSIS — E1151 Type 2 diabetes mellitus with diabetic peripheral angiopathy without gangrene: Secondary | ICD-10-CM | POA: Diagnosis not present

## 2017-04-16 DIAGNOSIS — L97812 Non-pressure chronic ulcer of other part of right lower leg with fat layer exposed: Secondary | ICD-10-CM | POA: Diagnosis not present

## 2017-04-19 ENCOUNTER — Other Ambulatory Visit: Payer: Self-pay | Admitting: *Deleted

## 2017-04-19 ENCOUNTER — Ambulatory Visit (INDEPENDENT_AMBULATORY_CARE_PROVIDER_SITE_OTHER): Payer: Medicare Other | Admitting: Surgery

## 2017-04-19 ENCOUNTER — Encounter: Payer: Self-pay | Admitting: Surgery

## 2017-04-19 ENCOUNTER — Encounter: Payer: Self-pay | Admitting: *Deleted

## 2017-04-19 VITALS — BP 162/73 | HR 99 | Temp 98.4°F | Resp 20 | Ht 61.0 in | Wt 128.1 lb

## 2017-04-19 DIAGNOSIS — I70233 Atherosclerosis of native arteries of right leg with ulceration of ankle: Secondary | ICD-10-CM

## 2017-04-19 DIAGNOSIS — I779 Disorder of arteries and arterioles, unspecified: Secondary | ICD-10-CM

## 2017-04-19 DIAGNOSIS — I739 Peripheral vascular disease, unspecified: Secondary | ICD-10-CM

## 2017-04-19 NOTE — Progress Notes (Signed)
    Patient name: Reginald Ayers MRN: [id number] DOB: 01/16/1951 Sex: male    HPI:Reginald H Whismanis a 66 y.o.male, who is Referred to me for a second opinion regarding ulcers on his right leg. He states they have been present for approximately 8 months. He has a duplex of the right leg would suggest popliteal occlusion as well as tibial vessel disease. His most recent ABI was 0.47 on the right.  He has seen slow improvement in his right LE wounds and still goes to the wound center at [facility] once a week.  He has aortogram with stent SFA 09/22/2016.  Then arthrectomy and posterior tibial angioplasty 12/08/2016.  He is here today for follow up ABI's, arterial duplex and carotid duplex.  He continues to take plavix and Zocor daily.  He has know DM managed with PO medication.     Past Medical History:  Diagnosis Date  . Carotid artery disease (HCC)    L-ICA 100%, mod R-ICA dz  . COPD (chronic obstructive pulmonary disease) (HCC)   . Coronary artery disease    s/p CABG February 2010 by Dr. Steve Hendrickson  . Diabetes (HCC)   . Emphysema lung (HCC)   . Hyperlipidemia   . Hypertension   . Peripheral arterial disease (HCC)    post left common iliac and right SFA stenting remotely  . Tobacco abuse        Past Surgical History:  Procedure Laterality Date  . ABDOMINAL AORTOGRAM W/LOWER EXTREMITY Right 12/08/2016   Procedure: Abdominal Aortogram w/Lower Extremity;  Surgeon: Nialah Saravia W Fidencio Duddy, MD;  Location: [facility];  Service: Cardiovascular;  Laterality: Right;  . CARDIAC CATHETERIZATION  2010  . CAROTID ANGIOGRAM  2014  . CORONARY ARTERY BYPASS GRAFT  2010   LIMA-LAD, Lrad-OM1, SVG-RI, SVG-AM-dRCA  . DOPPLER ECHOCARDIOGRAPHY  2010  . LOWER EXTREMITY ANGIOGRAM  11/2006   left common femoral endarterectomy and patch angioplasty:The mid right SFA was angioplastied with a 4 x 8  . LOWER EXTREMITY ANGIOGRAM  2003   left common iliac artery stenting by Dr. J. Berry  . NM MYOVIEW LTD   2015  . PERIPHERAL VASCULAR ATHERECTOMY Right 12/08/2016   Procedure: Peripheral Vascular Atherectomy;  Surgeon: Bravlio Luca W Heinrich Fertig, MD;  Location: [facility];  Service: Cardiovascular;  Laterality: Right;  . PERIPHERAL VASCULAR BALLOON ANGIOPLASTY Right 12/08/2016   Procedure: Peripheral Vascular Balloon Angioplasty;  Surgeon: Alexzandria Massman W Dezirae Service, MD;  Location: [facility];  Service: Cardiovascular;  Laterality: Right;  . PERIPHERAL VASCULAR CATHETERIZATION N/A 09/22/2016   Procedure: Abdominal Aortogram w/ bilateral Lower Extremity Runoff;  Surgeon: Louella Medaglia W Jaquelyn Sakamoto, MD;  Location: [facility];  Service: Cardiovascular;  Laterality: N/A;  . PERIPHERAL VASCULAR CATHETERIZATION Right 09/22/2016   Procedure: Peripheral Vascular Intervention;  Surgeon: Angellina Ferdinand W Morganna Styles, MD;  Location: [facility];  Service: Cardiovascular;  Laterality: Right;    Family History  Problem Relation Age of Onset  . Heart attack Mother   . CVA Mother   . Heart disease Mother   . Diabetes Sister   . Hypertension Sister     SOCIAL HISTORY: Social History   Social History  . Marital status: Single    Spouse name: N/A  . Number of children: N/A  . Years of education: N/A   Occupational History  . Retired [employer]    Social History Main Topics  . Smoking status: Light Tobacco Smoker    Packs/day: 0.25      Types: Cigarettes    Last attempt to quit: 08/07/2016  . Smokeless tobacco: Current User    Types: Chew  . Alcohol use No     Comment: Weekend drinker, quit 2000.   . Drug use: No  . Sexual activity: Not on file   Other Topics Concern  . Not on file   Social History Narrative   Lives in [place]. Has 2 roommates, they both smoke. Has 9 dogs.    Allergies  Allergen Reactions  . Actos [Pioglitazone] Shortness Of Breath    Leg swelling   . Lisinopril Cough    Current Outpatient Prescriptions  Medication Sig Dispense Refill  . albuterol (PROVENTIL HFA;VENTOLIN HFA) 108 (90  Base) MCG/ACT inhaler Inhale 2 puffs into the lungs every 4 (four) hours as needed for wheezing or shortness of breath.     . albuterol (PROVENTIL) (2.5 MG/3ML) 0.083% nebulizer solution Inhale 3 mLs into the lungs every 3 (three) hours as needed for shortness of breath.   1  . aspirin EC 325 MG tablet Take 325 mg by mouth daily. May take an additional 650mgs as needed for pain    . clopidogrel (PLAVIX) 75 MG tablet Take 75 mg by mouth daily.     . Cyanocobalamin (VITAMIN B-12 IJ) Inject 1 Dose as directed every 30 (thirty) days.     . furosemide (LASIX) 40 MG tablet Take 40 mg by mouth daily as needed for edema.    . glipiZIDE (GLUCOTROL XL) 10 MG 24 hr tablet Take 10 mg by mouth 2 (two) times daily.     . Homeopathic Products (LEG CRAMP RELIEF SL) Place 2 tablets under the tongue 2 (two) times daily as needed (leg cramps).    . ibuprofen (ADVIL,MOTRIN) 200 MG tablet Take 800 mg by mouth every 8 (eight) hours as needed for moderate pain.    . irbesartan (AVAPRO) 300 MG tablet Take 300 mg by mouth daily.     . Lidocaine-Glycerin (PREPARATION H RE) Place 1 application rectally daily as needed (hemorrhoids).    . metFORMIN (GLUCOPHAGE) 500 MG tablet Take 1,000 mg by mouth 2 (two) times daily with a meal.     . NITROSTAT 0.4 MG SL tablet Take 0.4 mg by mouth every 5 (five) minutes as needed for chest pain.   0  . Polyvinyl Alcohol-Povidone (REFRESH OP) Apply 1 drop to eye 2 (two) times daily as needed (dry eyes).    . Pseudoeph-Doxylamine-DM-APAP (NYQUIL PO) Take 1 Dose by mouth at bedtime.     . ranitidine (ZANTAC) 150 MG tablet Take 150 mg by mouth daily.    . simvastatin (ZOCOR) 40 MG tablet Take 40 mg by mouth every evening.    . SPIRIVA HANDIHALER 18 MCG inhalation capsule Place 18 mcg into inhaler and inhale daily.     . traMADol (ULTRAM) 50 MG tablet Take 50 mg by mouth 3 (three) times daily as needed for severe pain.     . vitamin B-12 (CYANOCOBALAMIN) 1000 MCG tablet Take 1,000 mcg by mouth  daily as needed (tingling).    . cephALEXin (KEFLEX) 500 MG capsule Take 500 mg by mouth 4 (four) times daily.  0  . dextromethorphan-guaiFENesin (MUCINEX DM) 30-600 MG 12hr tablet Take 1 tablet by mouth 2 (two) times daily as needed for cough.     No current facility-administered medications for this visit.     ROS:   General:  No weight loss, Fever, chills  HEENT: No recent headaches, no nasal bleeding,   no visual changes, no sore throat  Neurologic: No dizziness, blackouts, seizures. No recent symptoms of stroke or mini- stroke. No recent episodes of slurred speech, or temporary blindness.  Cardiac: No recent episodes of chest pain/pressure, no shortness of breath at rest.  No shortness of breath with exertion.  Denies history of atrial fibrillation or irregular heartbeat  Vascular: No history of rest pain in feet.  No history of claudication.  No history of non-healing ulcer, No history of DVT   Pulmonary: No home oxygen, no productive cough, no hemoptysis,  No asthma or wheezing  Musculoskeletal:  [ ] Arthritis, [ ] Low back pain,  [ ] Joint pain  Hematologic:No history of hypercoagulable state.  No history of easy bleeding.  No history of anemia  Gastrointestinal: No hematochezia or melena,  No gastroesophageal reflux, no trouble swallowing  Urinary: [ ] chronic Kidney disease, [ ] on HD - [ ] MWF or [ ] TTHS, [ ] Burning with urination, [ ] Frequent urination, [ ] Difficulty urinating;   Skin: No rashes  Psychological: No history of anxiety,  No history of depression   Physical Examination  Vitals:   04/19/17 1336 04/19/17 1337  BP: (!) 166/74 (!) 162/73  Pulse: 99   Resp: 20   Temp: 98.4 F (36.9 C)   TempSrc: Oral   SpO2: 96%   Weight: 128 lb 1.6 oz (58.1 kg)   Height: 5' 1" (1.549 m)     Body mass index is 24.2 kg/m.  General:  Alert and oriented, no acute distress HEENT: Normal Neck: No bruit or JVD Pulmonary: Clear to auscultation  bilaterally Cardiac: Regular Rate and Rhythm without murmur Abdomen: Soft, non-tender, non-distended, no mass, no scars Skin: No rash, beefy red base right leg wounds without significant change. Extremity Pulses:  2+ radial, brachial, femoral, non palpable dorsalis pedis, posterior tibial  bilaterally Musculoskeletal: No deformity, positive mild edema B LE  Neurologic: Upper and lower extremity motor 5/5 and symmetric  DATA:  ABI Decreased flow on the right LE change from 0.95 to 0.64 Left 0.45 now 0.51 showing no significant change in flow.  Carotid duplex Known left ICA occlusion, right 60-79% no significant change.  ASSESSMENT:   Carotid stenosis left ICA occluded, right asymptomatic 60-79% stenosis PAD s/p Patent SFA stent s/p arthrectomy with angioplasty of the right PTA 12/08/2016.  ABI and arterial duplex indicate stenosis of the SFA stent.  He is still having difficulty healing the right LE wounds.   PLAN:   We are recommending an Aortogram with LE run off and possible intervention based on non healing right LE wounds and reduced ABI's .  He will continue wound care at [facility].  Following his procedure we will schedule f/u for repeat carotid duplex and ABI's likely in 3 months.   COLLINS, EMMA MAUREEN PA-C [facility] [place]  I agree with the above.  The patient has previously undergone stenting in the past by Dr. Berry which has occluded.  I was able to recanalize the stents and aligned them with Viabahns on 09/22/2016.  He was brought back on 12/08/2016 for continued nonhealing of his wounds.  At that time I perform atherectomy and angioplasty of the right posterior tibial artery.  He is back today with ultrasound findings of elevated velocities at the origin of his Biobond stents with subsequent decrease in ABIs.  Because his wounds have not healed, I feel that we need to proceed with angiography to address the   origin of the stents to maximize his options at  wound healing.  I'm going to schedule this for tomorrow August 28.  This will be for a left femoral artery access  Wells Zoiee Wimmer 

## 2017-04-20 ENCOUNTER — Ambulatory Visit (HOSPITAL_COMMUNITY)
Admission: RE | Admit: 2017-04-20 | Discharge: 2017-04-20 | Disposition: A | Discharge status: Home / Self Care | Payer: Medicare Other | Source: Ambulatory Visit | Attending: Surgery | Admitting: Surgery

## 2017-04-20 ENCOUNTER — Encounter (HOSPITAL_COMMUNITY)
Admission: RE | Disposition: A | Discharge status: Home / Self Care | Payer: Self-pay | Source: Ambulatory Visit | Attending: Surgery

## 2017-04-20 DIAGNOSIS — I2510 Atherosclerotic heart disease of native coronary artery without angina pectoris: Secondary | ICD-10-CM | POA: Insufficient documentation

## 2017-04-20 DIAGNOSIS — L97919 Non-pressure chronic ulcer of unspecified part of right lower leg with unspecified severity: Secondary | ICD-10-CM | POA: Diagnosis not present

## 2017-04-20 DIAGNOSIS — Y831 Surgical operation with implant of artificial internal device as the cause of abnormal reaction of the patient, or of later complication, without mention of misadventure at the time of the procedure: Secondary | ICD-10-CM | POA: Diagnosis not present

## 2017-04-20 DIAGNOSIS — J449 Chronic obstructive pulmonary disease, unspecified: Secondary | ICD-10-CM | POA: Insufficient documentation

## 2017-04-20 DIAGNOSIS — I6523 Occlusion and stenosis of bilateral carotid arteries: Secondary | ICD-10-CM | POA: Diagnosis not present

## 2017-04-20 DIAGNOSIS — I70239 Atherosclerosis of native arteries of right leg with ulceration of unspecified site: Secondary | ICD-10-CM | POA: Diagnosis not present

## 2017-04-20 DIAGNOSIS — Z7982 Long term (current) use of aspirin: Secondary | ICD-10-CM | POA: Insufficient documentation

## 2017-04-20 DIAGNOSIS — E1151 Type 2 diabetes mellitus with diabetic peripheral angiopathy without gangrene: Secondary | ICD-10-CM | POA: Insufficient documentation

## 2017-04-20 DIAGNOSIS — T82856A Stenosis of peripheral vascular stent, initial encounter: Secondary | ICD-10-CM | POA: Diagnosis not present

## 2017-04-20 DIAGNOSIS — Z7984 Long term (current) use of oral hypoglycemic drugs: Secondary | ICD-10-CM | POA: Diagnosis not present

## 2017-04-20 DIAGNOSIS — Z951 Presence of aortocoronary bypass graft: Secondary | ICD-10-CM | POA: Insufficient documentation

## 2017-04-20 DIAGNOSIS — Z7902 Long term (current) use of antithrombotics/antiplatelets: Secondary | ICD-10-CM | POA: Insufficient documentation

## 2017-04-20 DIAGNOSIS — F17210 Nicotine dependence, cigarettes, uncomplicated: Secondary | ICD-10-CM | POA: Insufficient documentation

## 2017-04-20 DIAGNOSIS — E785 Hyperlipidemia, unspecified: Secondary | ICD-10-CM | POA: Diagnosis not present

## 2017-04-20 HISTORY — PX: ABDOMINAL AORTOGRAM: CATH118222

## 2017-04-20 HISTORY — PX: LOWER EXTREMITY ANGIOGRAPHY: CATH118251

## 2017-04-20 HISTORY — PX: PERIPHERAL VASCULAR BALLOON ANGIOPLASTY: CATH118281

## 2017-04-20 LAB — POCT I-STAT, CHEM 8
BUN: 14 mg/dL (ref 6–20)
Calcium, Ion: 1.24 mmol/L (ref 1.15–1.40)
Chloride: 102 mmol/L (ref 101–111)
Creatinine, Ser: 0.8 mg/dL (ref 0.61–1.24)
GLUCOSE: 191 mg/dL — AB (ref 65–99)
HEMATOCRIT: 30 % — AB (ref 39.0–52.0)
Hemoglobin: 10.2 g/dL — ABNORMAL LOW (ref 13.0–17.0)
POTASSIUM: 4.3 mmol/L (ref 3.5–5.1)
Sodium: 135 mmol/L (ref 135–145)
TCO2: 22 mmol/L (ref 22–32)

## 2017-04-20 LAB — POCT ACTIVATED CLOTTING TIME
ACTIVATED CLOTTING TIME: 164 s
Activated Clotting Time: 191 seconds

## 2017-04-20 LAB — GLUCOSE, CAPILLARY: GLUCOSE-CAPILLARY: 107 mg/dL — AB (ref 65–99)

## 2017-04-20 SURGERY — ABDOMINAL AORTOGRAM
Anesthesia: LOCAL | Laterality: Right

## 2017-04-20 MED ORDER — HEPARIN SODIUM (PORCINE) 1000 UNIT/ML IJ SOLN
INTRAMUSCULAR | Status: DC | PRN
  Administered 2017-04-20: 5000 [IU] via INTRAVENOUS

## 2017-04-20 MED ORDER — HEPARIN SODIUM (PORCINE) 1000 UNIT/ML IJ SOLN
INTRAMUSCULAR | Status: AC
  Filled 2017-04-20: qty 1

## 2017-04-20 MED ORDER — LIDOCAINE HCL (PF) 1 % IJ SOLN
INTRAMUSCULAR | Status: AC
Start: 2017-04-20 — End: ?
  Filled 2017-04-20: qty 30

## 2017-04-20 MED ORDER — CYANOCOBALAMIN 1000 MCG/ML IJ SOLN: 1000.0000 ug | Freq: Once | INTRAMUSCULAR | Status: DC

## 2017-04-20 MED ORDER — IODIXANOL 320 MG/ML IV SOLN
INTRAVENOUS | Status: DC | PRN
  Administered 2017-04-20: 90 mL via INTRAVENOUS

## 2017-04-20 MED ORDER — FENTANYL CITRATE (PF) 100 MCG/2ML IJ SOLN
INTRAMUSCULAR | Status: DC | PRN
  Administered 2017-04-20: 50 ug via INTRAVENOUS

## 2017-04-20 MED ORDER — LIDOCAINE HCL (PF) 1 % IJ SOLN
INTRAMUSCULAR | Status: DC | PRN
  Administered 2017-04-20: 12 mL

## 2017-04-20 MED ORDER — CYANOCOBALAMIN 1000 MCG/ML IJ SOLN
1000.0000 ug | INTRAMUSCULAR | Status: AC
  Administered 2017-04-20: 1000 ug via INTRAMUSCULAR
  Filled 2017-04-20 (×2): qty 1

## 2017-04-20 MED ORDER — HEPARIN (PORCINE) IN NACL 2-0.9 UNIT/ML-% IJ SOLN
INTRAMUSCULAR | Status: AC | PRN
  Administered 2017-04-20: 1000 mL

## 2017-04-20 MED ORDER — SODIUM CHLORIDE 0.9 % IV SOLN: INTRAVENOUS | Status: AC

## 2017-04-20 MED ORDER — FENTANYL CITRATE (PF) 100 MCG/2ML IJ SOLN
INTRAMUSCULAR | Status: AC
  Filled 2017-04-20: qty 2

## 2017-04-20 MED ORDER — MIDAZOLAM HCL 2 MG/2ML IJ SOLN
INTRAMUSCULAR | Status: DC | PRN
  Administered 2017-04-20: 1 mg via INTRAVENOUS

## 2017-04-20 MED ORDER — HEPARIN (PORCINE) IN NACL 2-0.9 UNIT/ML-% IJ SOLN
INTRAMUSCULAR | Status: AC
  Filled 2017-04-20: qty 500

## 2017-04-20 MED ORDER — MIDAZOLAM HCL 2 MG/2ML IJ SOLN
INTRAMUSCULAR | Status: AC
Start: 2017-04-20 — End: ?
  Filled 2017-04-20: qty 2

## 2017-04-20 MED ORDER — SODIUM CHLORIDE 0.9 % IV SOLN
INTRAVENOUS | Status: DC
  Administered 2017-04-20: 11:00:00 via INTRAVENOUS

## 2017-04-20 SURGICAL SUPPLY — 19 items
BALLN ARMADA 6X20X80 (BALLOONS) ×3
BALLN LUTONIX DCB 6X80X130 (BALLOONS) ×6
BALLOON ARMADA 6X20X80 (BALLOONS) IMPLANT
BALLOON LUTONIX DCB 6X80X130 (BALLOONS) IMPLANT
CATH OMNI FLUSH 5F 65CM (CATHETERS) ×1 IMPLANT
COVER PRB 48X5XTLSCP FOLD TPE (BAG) IMPLANT
COVER PROBE 5X48 (BAG) ×3
DEVICE CONTINUOUS FLUSH (MISCELLANEOUS) ×1 IMPLANT
KIT ENCORE 26 ADVANTAGE (KITS) ×1 IMPLANT
KIT MICROINTRODUCER STIFF 5F (SHEATH) ×1 IMPLANT
KIT PV (KITS) ×3 IMPLANT
SHEATH PINNACLE 5F 10CM (SHEATH) ×1 IMPLANT
SHEATH PINNACLE MP 6F 45CM (SHEATH) ×1 IMPLANT
SYR MEDRAD MARK V 150ML (SYRINGE) ×3 IMPLANT
TRANSDUCER W/STOPCOCK (MISCELLANEOUS) ×3 IMPLANT
TRAY PV CATH (CUSTOM PROCEDURE TRAY) ×3 IMPLANT
WIRE BENTSON .035X145CM (WIRE) ×1 IMPLANT
WIRE HI TORQ VERSACORE J 260CM (WIRE) ×1 IMPLANT
WIRE TORQFLEX AUST .018X40CM (WIRE) ×1 IMPLANT

## 2017-04-20 NOTE — Progress Notes (Signed)
Site area: left groin fa sheath Site Prior to Removal:  Level 0 Pressure Applied For: 20 minutes Manual:   yes Patient Status During Pull:  stable Post Pull Site:  Level 0 Post Pull Instructions Given:  yes Post Pull Pulses Present: dopplered Dressing Applied:  Gauze and tegaderm Bedrest begins @ 1715 Comments:

## 2017-04-20 NOTE — Discharge Instructions (Signed)
° °  [facility]  Discharge Instructions  Lower Extremity Angiogram; Angioplasty/Stenting  Please refer to the following instructions for your post-procedure care. Your surgeon or physician assistant will discuss any changes with you.  Activity  Avoid lifting more than 8 pounds (1 gallons of milk) for 72 hours (3 days) after your procedure. You may walk as much as you can tolerate. It's OK to drive after 72 hours.  Bathing/Showering  You may shower the day after your procedure. If you have a bandage, you may remove it at 24- 48 hours. Clean your incision site with mild soap and water. Pat the area dry with a clean towel.  Diet  Resume your pre-procedure diet. There are no special food restrictions following this procedure. All patients with peripheral vascular disease should follow a low fat/low cholesterol diet. In order to heal from your surgery, it is CRITICAL to get adequate nutrition. Your body requires vitamins, minerals, and protein. Vegetables are the best source of vitamins and minerals. Vegetables also provide the perfect balance of protein. Processed food has little nutritional value, so try to avoid this.  Medications  Hold Metformin for 48 hours. May resume Fri morning.  Resume taking all of your medications unless your doctor tells you not to. If your incision is causing pain, you may take over-the-counter pain relievers such as acetaminophen (Tylenol)  Follow Up  Follow up will be arranged at the time of your procedure. You may have an office visit scheduled or may be scheduled for surgery. Ask your surgeon if you have any questions.  Please call us immediately for any of the following conditions: Severe or worsening pain your legs or feet at rest or with walking. Increased pain, redness, drainage at your groin puncture site. Fever of 101 degrees or higher. If you have any mild or slow bleeding from your puncture site: lie down,  apply firm constant pressure over the area with a piece of gauze or a clean wash cloth for 30 minutes- no peeking!, call 911 right away if you are still bleeding after 30 minutes, or if the bleeding is heavy and unmanageable.  Reduce your risk factors of vascular disease:  Stop smoking. If you would like help call [facility] at 1-800-QUIT-NOW (905-751-2283) or [facility] at [phone]. Manage your cholesterol Maintain a desired weight Control your diabetes Keep your blood pressure down  If you have any questions, please call the office at [phone]

## 2017-04-20 NOTE — Interval H&P Note (Signed)
History and Physical Interval Note:  04/20/2017 2:11 PM  Reginald Ayers  has presented today for surgery, with the diagnosis of pad  The various methods of treatment have been discussed with the patient and family. After consideration of risks, benefits and other options for treatment, the patient has consented to  Procedure(s): ABDOMINAL AORTOGRAM W/LOWER EXTREMITY (N/A) as a surgical intervention .  The patient's history has been reviewed, patient examined, no change in status, stable for surgery.  I have reviewed the patient's chart and labs.  Questions were answered to the patient's satisfaction.     [name]

## 2017-04-20 NOTE — Op Note (Signed)
Patient name: Reginald Ayers MRN: [id number] DOB: 1951/01/31 Sex: male  04/20/2017 Pre-operative Diagnosis: in-stent stenosis Post-operative diagnosis:  Same Surgeon:  Annamarie Major Procedure Performed:  1.  Ultrasound-guided access, left femoral artery  2.  Abdominal aortogram  3.  Right lower extremity runoff  4.  Drug coated balloon and plasty, right superficial femoral artery  5.  Balloon angioplasty, right common femoral artery  6.  Conscious sedation (37mnutes)    Indications:  The patient is previously undergone percutaneous revascularization.  His ultrasound yesterday identified a proximal stenosis creating significant decreased flow and his bypass.  He comes in today for further evaluation and possible intervention  Procedure:  The patient was identified in the holding area and taken to room 8.  The patient was then placed supine on the table and prepped and draped in the usual sterile fashion.  A time out was called.  Conscious sedation was administered with the use of IV fentanyl and Versed under continuous physician and nurse monitoring.  Heart rate, blood pressure, and oxygen saturation continuously monitored.  Ultrasound was used to evaluate the left common femoral artery.  It was patent .  A digital ultrasound image was acquired.  A micropuncture needle was used to access the left common femoral artery under ultrasound guidance.  An 018 wire was advanced without resistance and a micropuncture sheath was placed.  The 018 wire was removed and a benson wire was placed.  The micropuncture sheath was exchanged for a 5 french sheath.  An omniflush catheter was advanced over the wire to the level of L-1.  An abdominal angiogram was obtained.  Next, using the omniflush catheter and a benson wire, the aortic bifurcation was crossed and the catheter was placed into theright external iliac artery and right runoff was obtained.   Findings:   Aortogram:  No significant renal artery  stenosis.  No significant aortic stenosis.  No significant iliac artery stenosis bilaterally.  Right Lower Extremity:  The right common femoral and profunda femoral artery are widely patent.  There are stents within the right superficial femoral artery which are patent and widely patent with the exception of luminal irregularity at the origin of the superficial femoral artery.  The popliteal artery is patent throughout it's course with single-vessel runoff via the peroneal artery.  Left Lower Extremity:  Not evaluated  Intervention:  After the above images were acquired the decision was made to proceed with intervention.  A 6 French 45 cm sheath was advanced into the right external iliac artery.  The patient was fully heparinized.  Wire access was obtained to cross the lesion.  I selected a 6 x 80 drug coated Lutonix balloon and performed balloon angioplasty of the proximal lesion within the superficial femoral artery.  Follow-up imaging revealed significantly improved opacification of the stents however there did appear to be a dissection extending up into the common femoral artery.  I was not comfortable leaving this dissection and therefore I inserted a 6 x 20 balloon and perform balloon and plasty of the common femoral artery at nominal pressure for 2 atm.  Follow-up imaging revealed significant improvement in the dissection.  There was a small nonflow limiting dissection on the medial aspect of the artery which I did not feel it needed to be addressed.  At this point catheters and wires were removed.  The patient taken the holding area for sheath pull once his coag profile corrects  Impression:  #1  ostial stenosis of the  stents within the right superficial femoral artery.  Definitive stenosis could not be determined, likely greater than 70%.  This was treated with drug coated balloon angioplasty using a 6 mm balloon with residual stenosis less than 10%  #2  dissection in the common femoral artery  following balloon angioplasty of the superficial femoral artery stents.  This was resolved using a 6 x 20 balloon at nominal pressure for 2 minutes  #3  single vessel runoff via the peroneal artery    V. Annamarie Major, M.D. [facility] Office: [phone] Pager:  [phone]

## 2017-04-20 NOTE — H&P (View-Only) (Signed)
Patient name: Reginald Ayers MRN: [id number] DOB: 04/18/1951 Sex: male    WUJ:WJXBJYN H Russis a 66 y.o.male, who is Referred to me for a second opinion regarding ulcers on his right leg. He states they have been present for approximately 8 months. He has a duplex of the right leg would suggest popliteal occlusion as well as tibial vessel disease. His most recent ABI was 0.47 on the right.  He has seen slow improvement in his right LE wounds and still goes to the wound center at [facility] once a week.  He has aortogram with stent SFA 09/22/2016.  Then arthrectomy and posterior tibial angioplasty 12/08/2016.  He is here today for follow up ABI's, arterial duplex and carotid duplex.  He continues to take plavix and Zocor daily.  He has know DM managed with PO medication.     Past Medical History:  Diagnosis Date  . Carotid artery disease (HCC)    L-ICA 100%, mod R-ICA dz  . COPD (chronic obstructive pulmonary disease) (HCC)   . Coronary artery disease    s/p CABG February 2010 by Dr. Andrey Spearman  . Diabetes (HCC)   . Emphysema lung (HCC)   . Hyperlipidemia   . Hypertension   . Peripheral arterial disease (HCC)    post left common iliac and right SFA stenting remotely  . Tobacco abuse        Past Surgical History:  Procedure Laterality Date  . ABDOMINAL AORTOGRAM W/LOWER EXTREMITY Right 12/08/2016   Procedure: Abdominal Aortogram w/Lower Extremity;  Surgeon: Nada Libman, MD;  Location: [facility];  Service: Cardiovascular;  Laterality: Right;  . CARDIAC CATHETERIZATION  2010  . CAROTID ANGIOGRAM  2014  . CORONARY ARTERY BYPASS GRAFT  2010   LIMA-LAD, Lrad-OM1, SVG-RI, SVG-AM-dRCA  . DOPPLER ECHOCARDIOGRAPHY  2010  . LOWER EXTREMITY ANGIOGRAM  11/2006   left common femoral endarterectomy and patch angioplasty:The mid right SFA was angioplastied with a 4 x 8  . LOWER EXTREMITY ANGIOGRAM  2003   left common iliac artery stenting by Dr. Erlene Quan  . NM MYOVIEW LTD   2015  . PERIPHERAL VASCULAR ATHERECTOMY Right 12/08/2016   Procedure: Peripheral Vascular Atherectomy;  Surgeon: Nada Libman, MD;  Location: [facility];  Service: Cardiovascular;  Laterality: Right;  . PERIPHERAL VASCULAR BALLOON ANGIOPLASTY Right 12/08/2016   Procedure: Peripheral Vascular Balloon Angioplasty;  Surgeon: Nada Libman, MD;  Location: [facility];  Service: Cardiovascular;  Laterality: Right;  . PERIPHERAL VASCULAR CATHETERIZATION N/A 09/22/2016   Procedure: Abdominal Aortogram w/ bilateral Lower Extremity Runoff;  Surgeon: Nada Libman, MD;  Location: [facility];  Service: Cardiovascular;  Laterality: N/A;  . PERIPHERAL VASCULAR CATHETERIZATION Right 09/22/2016   Procedure: Peripheral Vascular Intervention;  Surgeon: Nada Libman, MD;  Location: [facility];  Service: Cardiovascular;  Laterality: Right;    Family History  Problem Relation Age of Onset  . Heart attack Mother   . CVA Mother   . Heart disease Mother   . Diabetes Sister   . Hypertension Sister     SOCIAL HISTORY: Social History   Social History  . Marital status: Single    Spouse name: N/A  . Number of children: N/A  . Years of education: N/A   Occupational History  . Retired Radiographer, therapeutic    Social History Main Topics  . Smoking status: Light Tobacco Smoker    Packs/day: 0.25  Types: Cigarettes    Last attempt to quit: 08/07/2016  . Smokeless tobacco: Current User    Types: Chew  . Alcohol use No     Comment: Weekend drinker, quit 2000.   . Drug use: No  . Sexual activity: Not on file   Other Topics Concern  . Not on file   Social History Narrative   Lives in [place]. Has 2 roommates, they both smoke. Has 9 dogs.    Allergies  Allergen Reactions  . Actos [Pioglitazone] Shortness Of Breath    Leg swelling   . Lisinopril Cough    Current Outpatient Prescriptions  Medication Sig Dispense Refill  . albuterol (PROVENTIL HFA;VENTOLIN HFA) 108 (90  Base) MCG/ACT inhaler Inhale 2 puffs into the lungs every 4 (four) hours as needed for wheezing or shortness of breath.     Marland Kitchen albuterol (PROVENTIL) (2.5 MG/3ML) 0.083% nebulizer solution Inhale 3 mLs into the lungs every 3 (three) hours as needed for shortness of breath.   1  . aspirin EC 325 MG tablet Take 325 mg by mouth daily. May take an additional 650mg s as needed for pain    . clopidogrel (PLAVIX) 75 MG tablet Take 75 mg by mouth daily.     . Cyanocobalamin (VITAMIN B-12 IJ) Inject 1 Dose as directed every 30 (thirty) days.     . furosemide (LASIX) 40 MG tablet Take 40 mg by mouth daily as needed for edema.    Marland Kitchen glipiZIDE (GLUCOTROL XL) 10 MG 24 hr tablet Take 10 mg by mouth 2 (two) times daily.     . Homeopathic Products (LEG CRAMP RELIEF SL) Place 2 tablets under the tongue 2 (two) times daily as needed (leg cramps).    Marland Kitchen ibuprofen (ADVIL,MOTRIN) 200 MG tablet Take 800 mg by mouth every 8 (eight) hours as needed for moderate pain.    Marland Kitchen irbesartan (AVAPRO) 300 MG tablet Take 300 mg by mouth daily.     . Lidocaine-Glycerin (PREPARATION H RE) Place 1 application rectally daily as needed (hemorrhoids).    . metFORMIN (GLUCOPHAGE) 500 MG tablet Take 1,000 mg by mouth 2 (two) times daily with a meal.     . NITROSTAT 0.4 MG SL tablet Take 0.4 mg by mouth every 5 (five) minutes as needed for chest pain.   0  . Polyvinyl Alcohol-Povidone (REFRESH OP) Apply 1 drop to eye 2 (two) times daily as needed (dry eyes).    . Pseudoeph-Doxylamine-DM-APAP (NYQUIL PO) Take 1 Dose by mouth at bedtime.     . ranitidine (ZANTAC) 150 MG tablet Take 150 mg by mouth daily.    . simvastatin (ZOCOR) 40 MG tablet Take 40 mg by mouth every evening.    Marland Kitchen SPIRIVA HANDIHALER 18 MCG inhalation capsule Place 18 mcg into inhaler and inhale daily.     . traMADol (ULTRAM) 50 MG tablet Take 50 mg by mouth 3 (three) times daily as needed for severe pain.     . vitamin B-12 (CYANOCOBALAMIN) 1000 MCG tablet Take 1,000 mcg by mouth  daily as needed (tingling).    . cephALEXin (KEFLEX) 500 MG capsule Take 500 mg by mouth 4 (four) times daily.  0  . dextromethorphan-guaiFENesin (MUCINEX DM) 30-600 MG 12hr tablet Take 1 tablet by mouth 2 (two) times daily as needed for cough.     No current facility-administered medications for this visit.     ROS:   General:  No weight loss, Fever, chills  HEENT: No recent headaches, no nasal bleeding,  no visual changes, no sore throat  Neurologic: No dizziness, blackouts, seizures. No recent symptoms of stroke or mini- stroke. No recent episodes of slurred speech, or temporary blindness.  Cardiac: No recent episodes of chest pain/pressure, no shortness of breath at rest.  No shortness of breath with exertion.  Denies history of atrial fibrillation or irregular heartbeat  Vascular: No history of rest pain in feet.  No history of claudication.  No history of non-healing ulcer, No history of DVT   Pulmonary: No home oxygen, no productive cough, no hemoptysis,  No asthma or wheezing  Musculoskeletal:  [ ]  Arthritis, [ ]  Low back pain,  [ ]  Joint pain  Hematologic:No history of hypercoagulable state.  No history of easy bleeding.  No history of anemia  Gastrointestinal: No hematochezia or melena,  No gastroesophageal reflux, no trouble swallowing  Urinary: [ ]  chronic Kidney disease, [ ]  on HD - [ ]  MWF or [ ]  TTHS, [ ]  Burning with urination, [ ]  Frequent urination, [ ]  Difficulty urinating;   Skin: No rashes  Psychological: No history of anxiety,  No history of depression   Physical Examination  Vitals:   04/19/17 1336 04/19/17 1337  BP: (!) 166/74 (!) 162/73  Pulse: 99   Resp: 20   Temp: 98.4 F (36.9 C)   TempSrc: Oral   SpO2: 96%   Weight: 128 lb 1.6 oz (58.1 kg)   Height: 5\' 1"  (1.549 m)     Body mass index is 24.2 kg/m.  General:  Alert and oriented, no acute distress HEENT: Normal Neck: No bruit or JVD Pulmonary: Clear to auscultation  bilaterally Cardiac: Regular Rate and Rhythm without murmur Abdomen: Soft, non-tender, non-distended, no mass, no scars Skin: No rash, beefy red base right leg wounds without significant change. Extremity Pulses:  2+ radial, brachial, femoral, non palpable dorsalis pedis, posterior tibial  bilaterally Musculoskeletal: No deformity, positive mild edema B LE  Neurologic: Upper and lower extremity motor 5/5 and symmetric  DATA:  ABI Decreased flow on the right LE change from 0.95 to 0.64 Left 0.45 now 0.51 showing no significant change in flow.  Carotid duplex Known left ICA occlusion, right 60-79% no significant change.  ASSESSMENT:   Carotid stenosis left ICA occluded, right asymptomatic 60-79% stenosis PAD s/p Patent SFA stent s/p arthrectomy with angioplasty of the right PTA 12/08/2016.  ABI and arterial duplex indicate stenosis of the SFA stent.  He is still having difficulty healing the right LE wounds.   PLAN:   We are recommending an Aortogram with LE run off and possible intervention based on non healing right LE wounds and reduced ABI's .  He will continue wound care at [facility].  Following his procedure we will schedule f/u for repeat carotid duplex and ABI's likely in 3 months.   Thomasena Edis, EMMA MAUREEN PA-C [facility]  I agree with the above.  The patient has previously undergone stenting in the past by Dr. Allyson Sabal which has occluded.  I was able to recanalize the stents and aligned them with [place] on 09/22/2016.  He was brought back on 12/08/2016 for continued nonhealing of his wounds.  At that time I perform atherectomy and angioplasty of the right posterior tibial artery.  He is back today with ultrasound findings of elevated velocities at the origin of his Biobond stents with subsequent decrease in ABIs.  Because his wounds have not healed, I feel that we need to proceed with angiography to address the  origin of the stents to maximize his options at  wound healing.  I'm going to schedule this for tomorrow August 28.  This will be for a left femoral artery access  Wells Oliviah Agostini

## 2017-04-21 ENCOUNTER — Encounter (HOSPITAL_COMMUNITY): Payer: Self-pay | Admitting: Surgery

## 2017-04-22 ENCOUNTER — Telehealth: Payer: Self-pay | Admitting: Family

## 2017-04-22 DIAGNOSIS — E11621 Type 2 diabetes mellitus with foot ulcer: Secondary | ICD-10-CM | POA: Diagnosis not present

## 2017-04-22 DIAGNOSIS — E11622 Type 2 diabetes mellitus with other skin ulcer: Secondary | ICD-10-CM | POA: Diagnosis not present

## 2017-04-22 DIAGNOSIS — J449 Chronic obstructive pulmonary disease, unspecified: Secondary | ICD-10-CM | POA: Diagnosis not present

## 2017-04-22 DIAGNOSIS — I70238 Atherosclerosis of native arteries of right leg with ulceration of other part of lower right leg: Secondary | ICD-10-CM | POA: Diagnosis not present

## 2017-04-22 DIAGNOSIS — L97512 Non-pressure chronic ulcer of other part of right foot with fat layer exposed: Secondary | ICD-10-CM | POA: Diagnosis not present

## 2017-04-22 DIAGNOSIS — E1151 Type 2 diabetes mellitus with diabetic peripheral angiopathy without gangrene: Secondary | ICD-10-CM | POA: Diagnosis not present

## 2017-04-22 DIAGNOSIS — L97812 Non-pressure chronic ulcer of other part of right lower leg with fat layer exposed: Secondary | ICD-10-CM | POA: Diagnosis not present

## 2017-04-22 NOTE — Telephone Encounter (Signed)
Sched appt 07/20/17; labs at 2:00 and NP at 3:15. Mailed appt letter.

## 2017-04-22 NOTE — Telephone Encounter (Signed)
-----   Message from Sharee PimpleMarilyn K McChesney, RN sent at 04/20/2017  4:22 PM EDT ----- Regarding: 3 months w/ NP and ABIs, duplex    ----- Message ----- From: Nada LibmanBrabham, Vance W, MD Sent: 04/20/2017   3:58 PM To: [facility] Charge Pool  02-18-2017:    Surgeon:  Durene CalBrabham, Wells Procedure Performed:  1.  Ultrasound-guided access, left femoral artery  2.  Abdominal aortogram  3.  Right lower extremity runoff  4.  Drug coated balloon and plasty, right superficial femoral artery  5.  Balloon angioplasty, right common femoral artery  6.  Conscious sedation (53minutes)  Follow-up 3 months with Rosalita ChessmanSuzanne with ABIs and duplex of the right leg

## 2017-04-27 DIAGNOSIS — L97312 Non-pressure chronic ulcer of right ankle with fat layer exposed: Secondary | ICD-10-CM | POA: Diagnosis not present

## 2017-04-27 DIAGNOSIS — L97812 Non-pressure chronic ulcer of other part of right lower leg with fat layer exposed: Secondary | ICD-10-CM | POA: Diagnosis not present

## 2017-04-27 DIAGNOSIS — Z4800 Encounter for change or removal of nonsurgical wound dressing: Secondary | ICD-10-CM | POA: Diagnosis not present

## 2017-04-27 DIAGNOSIS — E1151 Type 2 diabetes mellitus with diabetic peripheral angiopathy without gangrene: Secondary | ICD-10-CM | POA: Diagnosis not present

## 2017-04-27 DIAGNOSIS — Z7984 Long term (current) use of oral hypoglycemic drugs: Secondary | ICD-10-CM | POA: Diagnosis not present

## 2017-04-27 DIAGNOSIS — E11622 Type 2 diabetes mellitus with other skin ulcer: Secondary | ICD-10-CM | POA: Diagnosis not present

## 2017-04-29 ENCOUNTER — Encounter (HOSPITAL_BASED_OUTPATIENT_CLINIC_OR_DEPARTMENT_OTHER): Payer: Medicare Other | Attending: Internal Medicine

## 2017-04-29 DIAGNOSIS — L97812 Non-pressure chronic ulcer of other part of right lower leg with fat layer exposed: Secondary | ICD-10-CM | POA: Diagnosis not present

## 2017-04-29 DIAGNOSIS — E11622 Type 2 diabetes mellitus with other skin ulcer: Secondary | ICD-10-CM | POA: Diagnosis not present

## 2017-04-29 DIAGNOSIS — E11621 Type 2 diabetes mellitus with foot ulcer: Secondary | ICD-10-CM | POA: Insufficient documentation

## 2017-04-29 DIAGNOSIS — L97512 Non-pressure chronic ulcer of other part of right foot with fat layer exposed: Secondary | ICD-10-CM | POA: Diagnosis not present

## 2017-04-29 DIAGNOSIS — E1151 Type 2 diabetes mellitus with diabetic peripheral angiopathy without gangrene: Secondary | ICD-10-CM | POA: Insufficient documentation

## 2017-05-03 DIAGNOSIS — Z7984 Long term (current) use of oral hypoglycemic drugs: Secondary | ICD-10-CM | POA: Diagnosis not present

## 2017-05-03 DIAGNOSIS — E11622 Type 2 diabetes mellitus with other skin ulcer: Secondary | ICD-10-CM | POA: Diagnosis not present

## 2017-05-03 DIAGNOSIS — Z4800 Encounter for change or removal of nonsurgical wound dressing: Secondary | ICD-10-CM | POA: Diagnosis not present

## 2017-05-03 DIAGNOSIS — E1151 Type 2 diabetes mellitus with diabetic peripheral angiopathy without gangrene: Secondary | ICD-10-CM | POA: Diagnosis not present

## 2017-05-03 DIAGNOSIS — Z1211 Encounter for screening for malignant neoplasm of colon: Secondary | ICD-10-CM | POA: Diagnosis not present

## 2017-05-03 DIAGNOSIS — L97812 Non-pressure chronic ulcer of other part of right lower leg with fat layer exposed: Secondary | ICD-10-CM | POA: Diagnosis not present

## 2017-05-03 DIAGNOSIS — L97312 Non-pressure chronic ulcer of right ankle with fat layer exposed: Secondary | ICD-10-CM | POA: Diagnosis not present

## 2017-05-06 DIAGNOSIS — Z4800 Encounter for change or removal of nonsurgical wound dressing: Secondary | ICD-10-CM | POA: Diagnosis not present

## 2017-05-06 DIAGNOSIS — Z7984 Long term (current) use of oral hypoglycemic drugs: Secondary | ICD-10-CM | POA: Diagnosis not present

## 2017-05-06 DIAGNOSIS — L97312 Non-pressure chronic ulcer of right ankle with fat layer exposed: Secondary | ICD-10-CM | POA: Diagnosis not present

## 2017-05-06 DIAGNOSIS — L97812 Non-pressure chronic ulcer of other part of right lower leg with fat layer exposed: Secondary | ICD-10-CM | POA: Diagnosis not present

## 2017-05-06 DIAGNOSIS — E11622 Type 2 diabetes mellitus with other skin ulcer: Secondary | ICD-10-CM | POA: Diagnosis not present

## 2017-05-06 DIAGNOSIS — E1151 Type 2 diabetes mellitus with diabetic peripheral angiopathy without gangrene: Secondary | ICD-10-CM | POA: Diagnosis not present

## 2017-05-10 DIAGNOSIS — L97312 Non-pressure chronic ulcer of right ankle with fat layer exposed: Secondary | ICD-10-CM | POA: Diagnosis not present

## 2017-05-10 DIAGNOSIS — E11622 Type 2 diabetes mellitus with other skin ulcer: Secondary | ICD-10-CM | POA: Diagnosis not present

## 2017-05-10 DIAGNOSIS — Z4800 Encounter for change or removal of nonsurgical wound dressing: Secondary | ICD-10-CM | POA: Diagnosis not present

## 2017-05-10 DIAGNOSIS — E1151 Type 2 diabetes mellitus with diabetic peripheral angiopathy without gangrene: Secondary | ICD-10-CM | POA: Diagnosis not present

## 2017-05-10 DIAGNOSIS — L97812 Non-pressure chronic ulcer of other part of right lower leg with fat layer exposed: Secondary | ICD-10-CM | POA: Diagnosis not present

## 2017-05-10 DIAGNOSIS — Z7984 Long term (current) use of oral hypoglycemic drugs: Secondary | ICD-10-CM | POA: Diagnosis not present

## 2017-05-13 DIAGNOSIS — L97512 Non-pressure chronic ulcer of other part of right foot with fat layer exposed: Secondary | ICD-10-CM | POA: Diagnosis not present

## 2017-05-13 DIAGNOSIS — E1151 Type 2 diabetes mellitus with diabetic peripheral angiopathy without gangrene: Secondary | ICD-10-CM | POA: Diagnosis not present

## 2017-05-13 DIAGNOSIS — L97519 Non-pressure chronic ulcer of other part of right foot with unspecified severity: Secondary | ICD-10-CM | POA: Diagnosis not present

## 2017-05-13 DIAGNOSIS — E11621 Type 2 diabetes mellitus with foot ulcer: Secondary | ICD-10-CM | POA: Diagnosis not present

## 2017-05-13 DIAGNOSIS — E11622 Type 2 diabetes mellitus with other skin ulcer: Secondary | ICD-10-CM | POA: Diagnosis not present

## 2017-05-13 DIAGNOSIS — L97819 Non-pressure chronic ulcer of other part of right lower leg with unspecified severity: Secondary | ICD-10-CM | POA: Diagnosis not present

## 2017-05-13 DIAGNOSIS — L97812 Non-pressure chronic ulcer of other part of right lower leg with fat layer exposed: Secondary | ICD-10-CM | POA: Diagnosis not present

## 2017-05-17 DIAGNOSIS — L97312 Non-pressure chronic ulcer of right ankle with fat layer exposed: Secondary | ICD-10-CM | POA: Diagnosis not present

## 2017-05-17 DIAGNOSIS — E1151 Type 2 diabetes mellitus with diabetic peripheral angiopathy without gangrene: Secondary | ICD-10-CM | POA: Diagnosis not present

## 2017-05-17 DIAGNOSIS — Z4800 Encounter for change or removal of nonsurgical wound dressing: Secondary | ICD-10-CM | POA: Diagnosis not present

## 2017-05-17 DIAGNOSIS — Z7984 Long term (current) use of oral hypoglycemic drugs: Secondary | ICD-10-CM | POA: Diagnosis not present

## 2017-05-17 DIAGNOSIS — L97812 Non-pressure chronic ulcer of other part of right lower leg with fat layer exposed: Secondary | ICD-10-CM | POA: Diagnosis not present

## 2017-05-17 DIAGNOSIS — E11622 Type 2 diabetes mellitus with other skin ulcer: Secondary | ICD-10-CM | POA: Diagnosis not present

## 2017-05-20 DIAGNOSIS — L97812 Non-pressure chronic ulcer of other part of right lower leg with fat layer exposed: Secondary | ICD-10-CM | POA: Diagnosis not present

## 2017-05-20 DIAGNOSIS — L97312 Non-pressure chronic ulcer of right ankle with fat layer exposed: Secondary | ICD-10-CM | POA: Diagnosis not present

## 2017-05-20 DIAGNOSIS — Z4800 Encounter for change or removal of nonsurgical wound dressing: Secondary | ICD-10-CM | POA: Diagnosis not present

## 2017-05-20 DIAGNOSIS — E11622 Type 2 diabetes mellitus with other skin ulcer: Secondary | ICD-10-CM | POA: Diagnosis not present

## 2017-05-20 DIAGNOSIS — E1151 Type 2 diabetes mellitus with diabetic peripheral angiopathy without gangrene: Secondary | ICD-10-CM | POA: Diagnosis not present

## 2017-05-20 DIAGNOSIS — Z7984 Long term (current) use of oral hypoglycemic drugs: Secondary | ICD-10-CM | POA: Diagnosis not present

## 2017-05-21 DIAGNOSIS — E1151 Type 2 diabetes mellitus with diabetic peripheral angiopathy without gangrene: Secondary | ICD-10-CM | POA: Diagnosis not present

## 2017-05-21 DIAGNOSIS — E11622 Type 2 diabetes mellitus with other skin ulcer: Secondary | ICD-10-CM | POA: Diagnosis not present

## 2017-05-21 DIAGNOSIS — L97312 Non-pressure chronic ulcer of right ankle with fat layer exposed: Secondary | ICD-10-CM | POA: Diagnosis not present

## 2017-05-21 DIAGNOSIS — Z7902 Long term (current) use of antithrombotics/antiplatelets: Secondary | ICD-10-CM | POA: Diagnosis not present

## 2017-05-21 DIAGNOSIS — L97812 Non-pressure chronic ulcer of other part of right lower leg with fat layer exposed: Secondary | ICD-10-CM | POA: Diagnosis not present

## 2017-05-21 DIAGNOSIS — Z7984 Long term (current) use of oral hypoglycemic drugs: Secondary | ICD-10-CM | POA: Diagnosis not present

## 2017-05-21 DIAGNOSIS — Z4800 Encounter for change or removal of nonsurgical wound dressing: Secondary | ICD-10-CM | POA: Diagnosis not present

## 2017-05-24 DIAGNOSIS — Z7984 Long term (current) use of oral hypoglycemic drugs: Secondary | ICD-10-CM | POA: Diagnosis not present

## 2017-05-24 DIAGNOSIS — E11622 Type 2 diabetes mellitus with other skin ulcer: Secondary | ICD-10-CM | POA: Diagnosis not present

## 2017-05-24 DIAGNOSIS — L97812 Non-pressure chronic ulcer of other part of right lower leg with fat layer exposed: Secondary | ICD-10-CM | POA: Diagnosis not present

## 2017-05-24 DIAGNOSIS — L97312 Non-pressure chronic ulcer of right ankle with fat layer exposed: Secondary | ICD-10-CM | POA: Diagnosis not present

## 2017-05-24 DIAGNOSIS — E1151 Type 2 diabetes mellitus with diabetic peripheral angiopathy without gangrene: Secondary | ICD-10-CM | POA: Diagnosis not present

## 2017-05-24 DIAGNOSIS — Z4800 Encounter for change or removal of nonsurgical wound dressing: Secondary | ICD-10-CM | POA: Diagnosis not present

## 2017-05-27 DIAGNOSIS — E11622 Type 2 diabetes mellitus with other skin ulcer: Secondary | ICD-10-CM | POA: Diagnosis not present

## 2017-05-27 DIAGNOSIS — E1151 Type 2 diabetes mellitus with diabetic peripheral angiopathy without gangrene: Secondary | ICD-10-CM | POA: Diagnosis not present

## 2017-05-27 DIAGNOSIS — L97812 Non-pressure chronic ulcer of other part of right lower leg with fat layer exposed: Secondary | ICD-10-CM | POA: Diagnosis not present

## 2017-05-27 DIAGNOSIS — Z4800 Encounter for change or removal of nonsurgical wound dressing: Secondary | ICD-10-CM | POA: Diagnosis not present

## 2017-05-27 DIAGNOSIS — L97312 Non-pressure chronic ulcer of right ankle with fat layer exposed: Secondary | ICD-10-CM | POA: Diagnosis not present

## 2017-05-27 DIAGNOSIS — Z7984 Long term (current) use of oral hypoglycemic drugs: Secondary | ICD-10-CM | POA: Diagnosis not present

## 2017-05-31 DIAGNOSIS — E1151 Type 2 diabetes mellitus with diabetic peripheral angiopathy without gangrene: Secondary | ICD-10-CM | POA: Diagnosis not present

## 2017-05-31 DIAGNOSIS — E11622 Type 2 diabetes mellitus with other skin ulcer: Secondary | ICD-10-CM | POA: Diagnosis not present

## 2017-05-31 DIAGNOSIS — L97312 Non-pressure chronic ulcer of right ankle with fat layer exposed: Secondary | ICD-10-CM | POA: Diagnosis not present

## 2017-05-31 DIAGNOSIS — Z4800 Encounter for change or removal of nonsurgical wound dressing: Secondary | ICD-10-CM | POA: Diagnosis not present

## 2017-05-31 DIAGNOSIS — L97812 Non-pressure chronic ulcer of other part of right lower leg with fat layer exposed: Secondary | ICD-10-CM | POA: Diagnosis not present

## 2017-05-31 DIAGNOSIS — Z7984 Long term (current) use of oral hypoglycemic drugs: Secondary | ICD-10-CM | POA: Diagnosis not present

## 2017-06-03 ENCOUNTER — Encounter (HOSPITAL_BASED_OUTPATIENT_CLINIC_OR_DEPARTMENT_OTHER): Payer: Medicare Other | Attending: Internal Medicine

## 2017-06-03 DIAGNOSIS — Z9221 Personal history of antineoplastic chemotherapy: Secondary | ICD-10-CM | POA: Insufficient documentation

## 2017-06-03 DIAGNOSIS — E1151 Type 2 diabetes mellitus with diabetic peripheral angiopathy without gangrene: Secondary | ICD-10-CM | POA: Insufficient documentation

## 2017-06-03 DIAGNOSIS — I2510 Atherosclerotic heart disease of native coronary artery without angina pectoris: Secondary | ICD-10-CM | POA: Diagnosis not present

## 2017-06-03 DIAGNOSIS — Z87891 Personal history of nicotine dependence: Secondary | ICD-10-CM | POA: Insufficient documentation

## 2017-06-03 DIAGNOSIS — I10 Essential (primary) hypertension: Secondary | ICD-10-CM | POA: Insufficient documentation

## 2017-06-03 DIAGNOSIS — E11622 Type 2 diabetes mellitus with other skin ulcer: Secondary | ICD-10-CM | POA: Insufficient documentation

## 2017-06-03 DIAGNOSIS — J449 Chronic obstructive pulmonary disease, unspecified: Secondary | ICD-10-CM | POA: Insufficient documentation

## 2017-06-03 DIAGNOSIS — L97512 Non-pressure chronic ulcer of other part of right foot with fat layer exposed: Secondary | ICD-10-CM | POA: Insufficient documentation

## 2017-06-03 DIAGNOSIS — L97812 Non-pressure chronic ulcer of other part of right lower leg with fat layer exposed: Secondary | ICD-10-CM | POA: Insufficient documentation

## 2017-06-08 DIAGNOSIS — E1151 Type 2 diabetes mellitus with diabetic peripheral angiopathy without gangrene: Secondary | ICD-10-CM | POA: Diagnosis not present

## 2017-06-08 DIAGNOSIS — Z4800 Encounter for change or removal of nonsurgical wound dressing: Secondary | ICD-10-CM | POA: Diagnosis not present

## 2017-06-08 DIAGNOSIS — Z7984 Long term (current) use of oral hypoglycemic drugs: Secondary | ICD-10-CM | POA: Diagnosis not present

## 2017-06-08 DIAGNOSIS — L97812 Non-pressure chronic ulcer of other part of right lower leg with fat layer exposed: Secondary | ICD-10-CM | POA: Diagnosis not present

## 2017-06-08 DIAGNOSIS — E11622 Type 2 diabetes mellitus with other skin ulcer: Secondary | ICD-10-CM | POA: Diagnosis not present

## 2017-06-08 DIAGNOSIS — L97312 Non-pressure chronic ulcer of right ankle with fat layer exposed: Secondary | ICD-10-CM | POA: Diagnosis not present

## 2017-06-09 DIAGNOSIS — E11622 Type 2 diabetes mellitus with other skin ulcer: Secondary | ICD-10-CM | POA: Diagnosis not present

## 2017-06-09 DIAGNOSIS — L97312 Non-pressure chronic ulcer of right ankle with fat layer exposed: Secondary | ICD-10-CM | POA: Diagnosis not present

## 2017-06-09 DIAGNOSIS — E1151 Type 2 diabetes mellitus with diabetic peripheral angiopathy without gangrene: Secondary | ICD-10-CM | POA: Diagnosis not present

## 2017-06-09 DIAGNOSIS — L97812 Non-pressure chronic ulcer of other part of right lower leg with fat layer exposed: Secondary | ICD-10-CM | POA: Diagnosis not present

## 2017-06-09 DIAGNOSIS — Z7984 Long term (current) use of oral hypoglycemic drugs: Secondary | ICD-10-CM | POA: Diagnosis not present

## 2017-06-09 DIAGNOSIS — Z4800 Encounter for change or removal of nonsurgical wound dressing: Secondary | ICD-10-CM | POA: Diagnosis not present

## 2017-06-10 DIAGNOSIS — L97312 Non-pressure chronic ulcer of right ankle with fat layer exposed: Secondary | ICD-10-CM | POA: Diagnosis not present

## 2017-06-10 DIAGNOSIS — L97812 Non-pressure chronic ulcer of other part of right lower leg with fat layer exposed: Secondary | ICD-10-CM | POA: Diagnosis not present

## 2017-06-10 DIAGNOSIS — Z4800 Encounter for change or removal of nonsurgical wound dressing: Secondary | ICD-10-CM | POA: Diagnosis not present

## 2017-06-10 DIAGNOSIS — Z7984 Long term (current) use of oral hypoglycemic drugs: Secondary | ICD-10-CM | POA: Diagnosis not present

## 2017-06-10 DIAGNOSIS — E1151 Type 2 diabetes mellitus with diabetic peripheral angiopathy without gangrene: Secondary | ICD-10-CM | POA: Diagnosis not present

## 2017-06-10 DIAGNOSIS — E11622 Type 2 diabetes mellitus with other skin ulcer: Secondary | ICD-10-CM | POA: Diagnosis not present

## 2017-06-15 DIAGNOSIS — L97812 Non-pressure chronic ulcer of other part of right lower leg with fat layer exposed: Secondary | ICD-10-CM | POA: Diagnosis not present

## 2017-06-15 DIAGNOSIS — Z7984 Long term (current) use of oral hypoglycemic drugs: Secondary | ICD-10-CM | POA: Diagnosis not present

## 2017-06-15 DIAGNOSIS — E11622 Type 2 diabetes mellitus with other skin ulcer: Secondary | ICD-10-CM | POA: Diagnosis not present

## 2017-06-15 DIAGNOSIS — L97312 Non-pressure chronic ulcer of right ankle with fat layer exposed: Secondary | ICD-10-CM | POA: Diagnosis not present

## 2017-06-15 DIAGNOSIS — E1151 Type 2 diabetes mellitus with diabetic peripheral angiopathy without gangrene: Secondary | ICD-10-CM | POA: Diagnosis not present

## 2017-06-15 DIAGNOSIS — Z4800 Encounter for change or removal of nonsurgical wound dressing: Secondary | ICD-10-CM | POA: Diagnosis not present

## 2017-06-17 DIAGNOSIS — I2510 Atherosclerotic heart disease of native coronary artery without angina pectoris: Secondary | ICD-10-CM | POA: Diagnosis not present

## 2017-06-17 DIAGNOSIS — L97812 Non-pressure chronic ulcer of other part of right lower leg with fat layer exposed: Secondary | ICD-10-CM | POA: Diagnosis not present

## 2017-06-17 DIAGNOSIS — E11622 Type 2 diabetes mellitus with other skin ulcer: Secondary | ICD-10-CM | POA: Diagnosis not present

## 2017-06-17 DIAGNOSIS — J449 Chronic obstructive pulmonary disease, unspecified: Secondary | ICD-10-CM | POA: Diagnosis not present

## 2017-06-17 DIAGNOSIS — E1151 Type 2 diabetes mellitus with diabetic peripheral angiopathy without gangrene: Secondary | ICD-10-CM | POA: Diagnosis not present

## 2017-06-17 DIAGNOSIS — I70238 Atherosclerosis of native arteries of right leg with ulceration of other part of lower right leg: Secondary | ICD-10-CM | POA: Diagnosis not present

## 2017-06-17 DIAGNOSIS — L97512 Non-pressure chronic ulcer of other part of right foot with fat layer exposed: Secondary | ICD-10-CM | POA: Diagnosis not present

## 2017-06-21 DIAGNOSIS — E1151 Type 2 diabetes mellitus with diabetic peripheral angiopathy without gangrene: Secondary | ICD-10-CM | POA: Diagnosis not present

## 2017-06-21 DIAGNOSIS — E11622 Type 2 diabetes mellitus with other skin ulcer: Secondary | ICD-10-CM | POA: Diagnosis not present

## 2017-06-21 DIAGNOSIS — L97812 Non-pressure chronic ulcer of other part of right lower leg with fat layer exposed: Secondary | ICD-10-CM | POA: Diagnosis not present

## 2017-06-21 DIAGNOSIS — Z4800 Encounter for change or removal of nonsurgical wound dressing: Secondary | ICD-10-CM | POA: Diagnosis not present

## 2017-06-21 DIAGNOSIS — Z7984 Long term (current) use of oral hypoglycemic drugs: Secondary | ICD-10-CM | POA: Diagnosis not present

## 2017-06-21 DIAGNOSIS — L97312 Non-pressure chronic ulcer of right ankle with fat layer exposed: Secondary | ICD-10-CM | POA: Diagnosis not present

## 2017-06-24 ENCOUNTER — Other Ambulatory Visit: Payer: Self-pay

## 2017-06-24 DIAGNOSIS — E1151 Type 2 diabetes mellitus with diabetic peripheral angiopathy without gangrene: Secondary | ICD-10-CM | POA: Diagnosis not present

## 2017-06-24 DIAGNOSIS — E11622 Type 2 diabetes mellitus with other skin ulcer: Secondary | ICD-10-CM | POA: Diagnosis not present

## 2017-06-24 DIAGNOSIS — L97312 Non-pressure chronic ulcer of right ankle with fat layer exposed: Secondary | ICD-10-CM | POA: Diagnosis not present

## 2017-06-24 DIAGNOSIS — L97812 Non-pressure chronic ulcer of other part of right lower leg with fat layer exposed: Secondary | ICD-10-CM | POA: Diagnosis not present

## 2017-06-24 DIAGNOSIS — Z4800 Encounter for change or removal of nonsurgical wound dressing: Secondary | ICD-10-CM | POA: Diagnosis not present

## 2017-06-24 DIAGNOSIS — Z7984 Long term (current) use of oral hypoglycemic drugs: Secondary | ICD-10-CM | POA: Diagnosis not present

## 2017-06-24 DIAGNOSIS — I739 Peripheral vascular disease, unspecified: Secondary | ICD-10-CM

## 2017-06-28 DIAGNOSIS — L97312 Non-pressure chronic ulcer of right ankle with fat layer exposed: Secondary | ICD-10-CM | POA: Diagnosis not present

## 2017-06-28 DIAGNOSIS — Z7984 Long term (current) use of oral hypoglycemic drugs: Secondary | ICD-10-CM | POA: Diagnosis not present

## 2017-06-28 DIAGNOSIS — Z4800 Encounter for change or removal of nonsurgical wound dressing: Secondary | ICD-10-CM | POA: Diagnosis not present

## 2017-06-28 DIAGNOSIS — E11622 Type 2 diabetes mellitus with other skin ulcer: Secondary | ICD-10-CM | POA: Diagnosis not present

## 2017-06-28 DIAGNOSIS — E1151 Type 2 diabetes mellitus with diabetic peripheral angiopathy without gangrene: Secondary | ICD-10-CM | POA: Diagnosis not present

## 2017-06-28 DIAGNOSIS — L97812 Non-pressure chronic ulcer of other part of right lower leg with fat layer exposed: Secondary | ICD-10-CM | POA: Diagnosis not present

## 2017-07-01 ENCOUNTER — Encounter (HOSPITAL_BASED_OUTPATIENT_CLINIC_OR_DEPARTMENT_OTHER): Payer: Medicare Other | Attending: Internal Medicine

## 2017-07-01 DIAGNOSIS — J449 Chronic obstructive pulmonary disease, unspecified: Secondary | ICD-10-CM | POA: Diagnosis not present

## 2017-07-01 DIAGNOSIS — I2510 Atherosclerotic heart disease of native coronary artery without angina pectoris: Secondary | ICD-10-CM | POA: Diagnosis not present

## 2017-07-01 DIAGNOSIS — S91301A Unspecified open wound, right foot, initial encounter: Secondary | ICD-10-CM | POA: Insufficient documentation

## 2017-07-01 DIAGNOSIS — X58XXXA Exposure to other specified factors, initial encounter: Secondary | ICD-10-CM | POA: Insufficient documentation

## 2017-07-01 DIAGNOSIS — E1151 Type 2 diabetes mellitus with diabetic peripheral angiopathy without gangrene: Secondary | ICD-10-CM | POA: Insufficient documentation

## 2017-07-01 DIAGNOSIS — L97812 Non-pressure chronic ulcer of other part of right lower leg with fat layer exposed: Secondary | ICD-10-CM | POA: Insufficient documentation

## 2017-07-01 DIAGNOSIS — Z9221 Personal history of antineoplastic chemotherapy: Secondary | ICD-10-CM | POA: Insufficient documentation

## 2017-07-01 DIAGNOSIS — I10 Essential (primary) hypertension: Secondary | ICD-10-CM | POA: Diagnosis not present

## 2017-07-01 DIAGNOSIS — E11622 Type 2 diabetes mellitus with other skin ulcer: Secondary | ICD-10-CM | POA: Diagnosis not present

## 2017-07-05 DIAGNOSIS — Z7984 Long term (current) use of oral hypoglycemic drugs: Secondary | ICD-10-CM | POA: Diagnosis not present

## 2017-07-05 DIAGNOSIS — E11622 Type 2 diabetes mellitus with other skin ulcer: Secondary | ICD-10-CM | POA: Diagnosis not present

## 2017-07-05 DIAGNOSIS — Z4800 Encounter for change or removal of nonsurgical wound dressing: Secondary | ICD-10-CM | POA: Diagnosis not present

## 2017-07-05 DIAGNOSIS — E1151 Type 2 diabetes mellitus with diabetic peripheral angiopathy without gangrene: Secondary | ICD-10-CM | POA: Diagnosis not present

## 2017-07-05 DIAGNOSIS — L97312 Non-pressure chronic ulcer of right ankle with fat layer exposed: Secondary | ICD-10-CM | POA: Diagnosis not present

## 2017-07-05 DIAGNOSIS — L97812 Non-pressure chronic ulcer of other part of right lower leg with fat layer exposed: Secondary | ICD-10-CM | POA: Diagnosis not present

## 2017-07-08 DIAGNOSIS — Z4800 Encounter for change or removal of nonsurgical wound dressing: Secondary | ICD-10-CM | POA: Diagnosis not present

## 2017-07-08 DIAGNOSIS — Z7984 Long term (current) use of oral hypoglycemic drugs: Secondary | ICD-10-CM | POA: Diagnosis not present

## 2017-07-08 DIAGNOSIS — E11622 Type 2 diabetes mellitus with other skin ulcer: Secondary | ICD-10-CM | POA: Diagnosis not present

## 2017-07-08 DIAGNOSIS — L97312 Non-pressure chronic ulcer of right ankle with fat layer exposed: Secondary | ICD-10-CM | POA: Diagnosis not present

## 2017-07-08 DIAGNOSIS — L97812 Non-pressure chronic ulcer of other part of right lower leg with fat layer exposed: Secondary | ICD-10-CM | POA: Diagnosis not present

## 2017-07-08 DIAGNOSIS — E1151 Type 2 diabetes mellitus with diabetic peripheral angiopathy without gangrene: Secondary | ICD-10-CM | POA: Diagnosis not present

## 2017-07-12 DIAGNOSIS — Z4800 Encounter for change or removal of nonsurgical wound dressing: Secondary | ICD-10-CM | POA: Diagnosis not present

## 2017-07-12 DIAGNOSIS — Z7984 Long term (current) use of oral hypoglycemic drugs: Secondary | ICD-10-CM | POA: Diagnosis not present

## 2017-07-12 DIAGNOSIS — L97812 Non-pressure chronic ulcer of other part of right lower leg with fat layer exposed: Secondary | ICD-10-CM | POA: Diagnosis not present

## 2017-07-12 DIAGNOSIS — E1151 Type 2 diabetes mellitus with diabetic peripheral angiopathy without gangrene: Secondary | ICD-10-CM | POA: Diagnosis not present

## 2017-07-12 DIAGNOSIS — L97312 Non-pressure chronic ulcer of right ankle with fat layer exposed: Secondary | ICD-10-CM | POA: Diagnosis not present

## 2017-07-12 DIAGNOSIS — E11622 Type 2 diabetes mellitus with other skin ulcer: Secondary | ICD-10-CM | POA: Diagnosis not present

## 2017-07-16 DIAGNOSIS — Z4800 Encounter for change or removal of nonsurgical wound dressing: Secondary | ICD-10-CM | POA: Diagnosis not present

## 2017-07-16 DIAGNOSIS — E1151 Type 2 diabetes mellitus with diabetic peripheral angiopathy without gangrene: Secondary | ICD-10-CM | POA: Diagnosis not present

## 2017-07-16 DIAGNOSIS — L97312 Non-pressure chronic ulcer of right ankle with fat layer exposed: Secondary | ICD-10-CM | POA: Diagnosis not present

## 2017-07-16 DIAGNOSIS — Z7984 Long term (current) use of oral hypoglycemic drugs: Secondary | ICD-10-CM | POA: Diagnosis not present

## 2017-07-16 DIAGNOSIS — E11622 Type 2 diabetes mellitus with other skin ulcer: Secondary | ICD-10-CM | POA: Diagnosis not present

## 2017-07-16 DIAGNOSIS — L97812 Non-pressure chronic ulcer of other part of right lower leg with fat layer exposed: Secondary | ICD-10-CM | POA: Diagnosis not present

## 2017-07-19 DIAGNOSIS — L97512 Non-pressure chronic ulcer of other part of right foot with fat layer exposed: Secondary | ICD-10-CM | POA: Diagnosis not present

## 2017-07-19 DIAGNOSIS — E11622 Type 2 diabetes mellitus with other skin ulcer: Secondary | ICD-10-CM | POA: Diagnosis not present

## 2017-07-19 DIAGNOSIS — I2510 Atherosclerotic heart disease of native coronary artery without angina pectoris: Secondary | ICD-10-CM | POA: Diagnosis not present

## 2017-07-19 DIAGNOSIS — E1151 Type 2 diabetes mellitus with diabetic peripheral angiopathy without gangrene: Secondary | ICD-10-CM | POA: Diagnosis not present

## 2017-07-19 DIAGNOSIS — J449 Chronic obstructive pulmonary disease, unspecified: Secondary | ICD-10-CM | POA: Diagnosis not present

## 2017-07-19 DIAGNOSIS — L97812 Non-pressure chronic ulcer of other part of right lower leg with fat layer exposed: Secondary | ICD-10-CM | POA: Diagnosis not present

## 2017-07-19 DIAGNOSIS — S91301A Unspecified open wound, right foot, initial encounter: Secondary | ICD-10-CM | POA: Diagnosis not present

## 2017-07-20 ENCOUNTER — Encounter: Payer: Self-pay | Admitting: Family

## 2017-07-20 ENCOUNTER — Ambulatory Visit (INDEPENDENT_AMBULATORY_CARE_PROVIDER_SITE_OTHER)
Admission: RE | Admit: 2017-07-20 | Discharge: 2017-07-20 | Disposition: A | Discharge status: Home / Self Care | Payer: Medicare Other | Source: Ambulatory Visit | Attending: Family | Admitting: Family

## 2017-07-20 ENCOUNTER — Ambulatory Visit (HOSPITAL_COMMUNITY)
Admission: RE | Admit: 2017-07-20 | Discharge: 2017-07-20 | Disposition: A | Discharge status: Home / Self Care | Payer: Medicare Other | Source: Ambulatory Visit | Attending: Family | Admitting: Family

## 2017-07-20 ENCOUNTER — Ambulatory Visit (INDEPENDENT_AMBULATORY_CARE_PROVIDER_SITE_OTHER): Payer: Medicare Other | Admitting: Family

## 2017-07-20 VITALS — BP 117/61 | HR 132 | Temp 97.7°F | Resp 19 | Wt 127.5 lb

## 2017-07-20 DIAGNOSIS — Z87891 Personal history of nicotine dependence: Secondary | ICD-10-CM

## 2017-07-20 DIAGNOSIS — E11622 Type 2 diabetes mellitus with other skin ulcer: Secondary | ICD-10-CM | POA: Diagnosis not present

## 2017-07-20 DIAGNOSIS — I872 Venous insufficiency (chronic) (peripheral): Secondary | ICD-10-CM | POA: Diagnosis not present

## 2017-07-20 DIAGNOSIS — I779 Disorder of arteries and arterioles, unspecified: Secondary | ICD-10-CM

## 2017-07-20 DIAGNOSIS — I6521 Occlusion and stenosis of right carotid artery: Secondary | ICD-10-CM | POA: Diagnosis not present

## 2017-07-20 DIAGNOSIS — I739 Peripheral vascular disease, unspecified: Secondary | ICD-10-CM

## 2017-07-20 DIAGNOSIS — Z4800 Encounter for change or removal of nonsurgical wound dressing: Secondary | ICD-10-CM | POA: Diagnosis not present

## 2017-07-20 DIAGNOSIS — Z9862 Peripheral vascular angioplasty status: Secondary | ICD-10-CM | POA: Diagnosis not present

## 2017-07-20 DIAGNOSIS — L97812 Non-pressure chronic ulcer of other part of right lower leg with fat layer exposed: Secondary | ICD-10-CM | POA: Diagnosis not present

## 2017-07-20 DIAGNOSIS — L97312 Non-pressure chronic ulcer of right ankle with fat layer exposed: Secondary | ICD-10-CM | POA: Diagnosis not present

## 2017-07-20 DIAGNOSIS — L97811 Non-pressure chronic ulcer of other part of right lower leg limited to breakdown of skin: Secondary | ICD-10-CM | POA: Diagnosis not present

## 2017-07-20 DIAGNOSIS — Z7984 Long term (current) use of oral hypoglycemic drugs: Secondary | ICD-10-CM | POA: Diagnosis not present

## 2017-07-20 DIAGNOSIS — I6522 Occlusion and stenosis of left carotid artery: Secondary | ICD-10-CM | POA: Diagnosis not present

## 2017-07-20 DIAGNOSIS — E1151 Type 2 diabetes mellitus with diabetic peripheral angiopathy without gangrene: Secondary | ICD-10-CM | POA: Diagnosis not present

## 2017-07-20 DIAGNOSIS — Z7902 Long term (current) use of antithrombotics/antiplatelets: Secondary | ICD-10-CM | POA: Diagnosis not present

## 2017-07-20 DIAGNOSIS — Z720 Tobacco use: Secondary | ICD-10-CM | POA: Diagnosis not present

## 2017-07-20 NOTE — Patient Instructions (Addendum)
What You Need to Know About Smokeless Tobacco Use Tobacco use is one of the leading causes of cancer and other chronic health problems. Smokeless tobacco is tobacco that is put directly into the mouth instead of being smoked. It may also be called chewing tobacco or snuff. Smokeless tobacco is made from the leaves of tobacco plants and it comes in several forms:  Loose, dry leaves, plugs, or twists.  Moist pouches.  Dissolving lozenges or strips.  Chewing, sucking, or holding the tobacco in your mouth causes your mouth to make more saliva. The saliva mixes with the tobacco to make "tobacco juice" that is swallowed or spit out. How can smokeless tobacco affect me? Using smokeless tobacco:  Increases your risk of developing cancer. Smokeless tobacco contains at least 28 different types of cancer-causing chemicals (carcinogens).  Increases your chances of developing other long-term health problems, including high blood pressure, heart disease, stroke, and dental problems.  Can make you become addicted. Nicotine is one of the chemicals in tobacco. When you chew tobacco, you absorb nicotine from the tobacco juice. This can make you feel more alert than usual.  Can cause problems with pregnancy. Pregnant women who use smokeless tobacco are more likely to miscarry or deliver a baby too early (premature delivery).  Can affect the appearance and health of your mouth. Using smokeless tobacco may cause bad breath, yellow-brown teeth, mouth sores, cracking and bleeding lips, gum recession, and lesions on the soft tissues of your mouth (leukoplakia).  What are the benefits of not using smokeless tobacco? The benefits of not using smokeless tobacco include:  A healthy mind because: ? You avoid addiction.  A healthy body because: ? You avoid dental problems. ? You promote healthy pregnancy. ? You avoid long-term health problems.  A healthy wallet because: ? You avoid costs of buying  tobacco. ? You avoid health care costs in the future.  A healthy family because: ? You avoid accidental poisoning of children in your household.  What can happen if I continue to use smokeless tobacco? If you continue to use smokeless tobacco, you will increase your risk for developing certain cancers. These include:  Tongue.  Lips, mouth, and gums.  Throat (esophagus) and voice box (larynx).  Stomach.  Pancreas.  Bladder.  Colon.  Long-term use of smokeless tobacco can also lead to:  High blood pressure, heart disease, and stroke.  Gum disease, gum recession, and bone loss around the teeth.  Tooth decay.  How do I quit using smokeless tobacco? Quitting the use of smokeless tobacco can be hard, but it can be done. Follow these steps:  Pick a date to quit. Set a date within the next two weeks. This gives you time to prepare.  Write down the reasons why you are quitting. Keep this list in places where you will see it often, such as on your bathroom mirror or in your car or wallet.  Identify the people, places, things, and activities that make you want to use tobacco (triggers) and avoid them.  Get rid of any tobacco you have and remove any tobacco smells. To do this: ? Throw away all containers of tobacco at home, at work, and in your car. ? Throw away any other items that you use regularly when you chew tobacco. ? Clean your car and make sure to remove all tobacco-related items. ? Clean your home, including curtains and carpets.  Tell your family, friends, and coworkers that you are quitting. This can make quitting   easier.  Ask your health care provider for help quitting smokeless tobacco. This may involve treatment. Find out what treatment options are covered by your health insurance.  Keep track of how many days have passed since you quit. Remembering how long and hard you have worked to quit can help you avoid using tobacco again.  Where can I get support? Ask  your health care provider if there is a local support group for quitting smokeless tobacco. Where can I get more information? You can learn more about the risks of using smokeless tobacco and the benefits of quitting from these sources:  [place]: www.cancer.gov  American Cancer Society: www.cancer.org  When should I seek medical care? Seek medical care if you have:  White or other discolored patches in your mouth.  Difficulty swallowing.  A change in your voice.  Unexplained weight loss.  Stomach pain, nausea, or vomiting.  Summary  Smokeless tobacco contains at least 28 different chemicals that are known to cause cancer (carcinogen).  Nicotine is an addictive chemical in smokeless tobacco.  When you quit using smokeless tobacco, you lower your risk of developing cancer. This information is not intended to replace advice given to you by your health care provider. Make sure you discuss any questions you have with your health care provider. Document Released: 01/12/2011 Document Revised: 04/04/2016 Document Reviewed: 03/22/2015 Elsevier Interactive Patient Education  2017 [place].      Peripheral Vascular Disease Peripheral vascular disease (PVD) is a disease of the blood vessels that are not part of your heart and brain. A simple term for PVD is poor circulation. In most cases, PVD narrows the blood vessels that carry blood from your heart to the rest of your body. This can result in a decreased supply of blood to your arms, legs, and internal organs, like your stomach or kidneys. However, it most often affects a person's lower legs and feet. There are two types of PVD.  Organic PVD. This is the more common type. It is caused by damage to the structure of blood vessels.  Functional PVD. This is caused by conditions that make blood vessels contract and tighten (spasm).  Without treatment, PVD tends to get worse over time. PVD can also lead to acute  ischemic limb. This is when an arm or limb suddenly has trouble getting enough blood. This is a medical emergency. Follow these instructions at home:  Take medicines only as told by your doctor.  Do not use any tobacco products, including cigarettes, chewing tobacco, or electronic cigarettes. If you need help quitting, ask your doctor.  Lose weight if you are overweight, and maintain a healthy weight as told by your doctor.  Eat a diet that is low in fat and cholesterol. If you need help, ask your doctor.  Exercise regularly. Ask your doctor for some good activities for you.  Take good care of your feet. ? Wear comfortable shoes that fit well. ? Check your feet often for any cuts or sores. Contact a doctor if:  You have cramps in your legs while walking.  You have leg pain when you are at rest.  You have coldness in a leg or foot.  Your skin changes.  You are unable to get or have an erection (erectile dysfunction).  You have cuts or sores on your feet that are not healing. Get help right away if:  Your arm or leg turns cold and blue.  Your arms or legs become red, warm, swollen,  painful, or numb.  You have chest pain or trouble breathing.  You suddenly have weakness in your face, arm, or leg.  You become very confused or you cannot speak.  You suddenly have a very bad headache.  You suddenly cannot see. This information is not intended to replace advice given to you by your health care provider. Make sure you discuss any questions you have with your health care provider. Document Released: 11/04/2009 Document Revised: 01/16/2016 Document Reviewed: 01/18/2014 Elsevier Interactive Patient Education  2017 [employer]     Stroke Prevention Some health problems and behaviors may make it more likely for you to have a stroke. Below are ways to lessen your risk of having a stroke.  Be active for at least 30 minutes on most or all days.  Do not smoke. Try not to be  around others who smoke.  Do not drink too much alcohol. ? Do not have more than 2 drinks a day if you are a man. ? Do not have more than 1 drink a day if you are a woman and are not pregnant.  Eat healthy foods, such as fruits and vegetables. If you were put on a specific diet, follow the diet as told.  Keep your cholesterol levels under control through diet and medicines. Look for foods that are low in saturated fat, trans fat, cholesterol, and are high in fiber.  If you have diabetes, follow all diet plans and take your medicine as told.  Ask your doctor if you need treatment to lower your blood pressure. If you have high blood pressure (hypertension), follow all diet plans and take your medicine as told by your doctor.  If you are 5118-68 years old, have your blood pressure checked every 3-5 years. If you are age 68 or older, have your blood pressure checked every year.  Keep a healthy weight. Eat foods that are low in calories, salt, saturated fat, trans fat, and cholesterol.  Do not take drugs.  Avoid birth control pills, if this applies. Talk to your doctor about the risks of taking birth control pills.  Talk to your doctor if you have sleep problems (sleep apnea).  Take all medicine as told by your doctor. ? You may be told to take aspirin or blood thinner medicine. Take this medicine as told by your doctor. ? Understand your medicine instructions.  Make sure any other conditions you have are being taken care of.  Get help right away if:  You suddenly lose feeling (you feel numb) or have weakness in your face, arm, or leg.  Your face or eyelid hangs down to one side.  You suddenly feel confused.  You have trouble talking (aphasia) or understanding what people are saying.  You suddenly have trouble seeing in one or both eyes.  You suddenly have trouble walking.  You are dizzy.  You lose your balance or your movements are clumsy (uncoordinated).  You suddenly have  a very bad headache and you do not know the cause.  You have new chest pain.  Your heart feels like it is fluttering or skipping a beat (irregular heartbeat). Do not wait to see if the symptoms above go away. Get help right away. Call your local emergency services (911 in [place] Do not drive yourself to the hospital. This information is not intended to replace advice given to you by your health care provider. Make sure you discuss any questions you have with your health care provider. Document Released: 02/09/2012  Document Revised: 01/16/2016 Document Reviewed: 02/10/2013 Elsevier Interactive Patient Education  [employer].

## 2017-07-20 NOTE — Progress Notes (Signed)
[facility] [place]   CC: Follow up peripheral artery occlusive disease  History of Present Illness Reginald Ayers is a 66 y.o. male who is s/p drug coated balloon angioplasty of right superficial femoral artery, and balloon angioplasty of right common femoral artery on 04-20-17 by Dr. Myra Gianotti for in-stent stenosis. The patient has previously undergone percutaneous revascularization. He returns today for 3 months follow up.   He had a CABG in 2010 that included LIMA to LAD. Left radial artery was used as one of the a bypass grafts.   He is treated at the wound care center at [facility] weekly, states he was told that his right lower leg ulcers are improving, appear to be venous stasis ulcers as he has stasis dermatitis in both lower legs. Marland Kitchen   He reports left shoulder issue, does not know if it rotator cuff issue, but is unable to raise his left arm.   He states he was exposed to [employer] in [place], states he was told by the [place] that his DM and vascular disease was facilitated by this.   Pt Diabetic: Yes, states his last A1C was 7.2 Pt smoker: former smoker, quit in 2016, started at age 53, but he uses smokeless tobacco, 1 can every 2-3 days.    Pt meds include: Statin :Yes Betablocker: No ASA: no Other anticoagulants/antiplatelets: Plavix  Past Medical History:  Diagnosis Date  . Carotid artery disease (HCC)    L-ICA 100%, mod R-ICA dz  . COPD (chronic obstructive pulmonary disease) (HCC)   . Coronary artery disease    s/p CABG February 2010 by Dr. Andrey Spearman  . Diabetes (HCC)   . Emphysema lung (HCC)   . Hyperlipidemia   . Hypertension   . Peripheral arterial disease (HCC)    post left common iliac and right SFA stenting remotely  . Tobacco abuse         Social History Social History   Tobacco Use  . Smoking status: Light Tobacco Smoker    Packs/day: 0.25    Types: Cigarettes    Last attempt to quit: 08/07/2016    Years since quitting:  0.9  . Smokeless tobacco: Current User    Types: Chew  Substance Use Topics  . Alcohol use: No    Alcohol/week: 0.0 oz    Comment: Weekend drinker, quit 2000.   . Drug use: No    Family History Family History  Problem Relation Age of Onset  . Heart attack Mother   . CVA Mother   . Heart disease Mother   . Diabetes Sister   . Hypertension Sister     Past Surgical History:  Procedure Laterality Date  . ABDOMINAL AORTOGRAM N/A 04/20/2017   Procedure: ABDOMINAL AORTOGRAM;  Surgeon: Nada Libman, MD;  Location: [facility];  Service: Cardiovascular;  Laterality: N/A;  . ABDOMINAL AORTOGRAM W/LOWER EXTREMITY Right 12/08/2016   Procedure: Abdominal Aortogram w/Lower Extremity;  Surgeon: Nada Libman, MD;  Location: [facility];  Service: Cardiovascular;  Laterality: Right;  . CARDIAC CATHETERIZATION  2010  . CAROTID ANGIOGRAM  2014  . CORONARY ARTERY BYPASS GRAFT  2010   LIMA-LAD, Lrad-OM1, SVG-RI, SVG-AM-dRCA  . DOPPLER ECHOCARDIOGRAPHY  2010  . LOWER EXTREMITY ANGIOGRAM  11/2006   left common femoral endarterectomy and patch angioplasty:The mid right SFA was angioplastied with a 4 x 8  . LOWER EXTREMITY ANGIOGRAM  2003   left common iliac artery stenting by Dr. Shela Commons.  [place]  . LOWER EXTREMITY ANGIOGRAPHY Right 04/20/2017   Procedure: Lower Extremity Angiography;  Surgeon: Nada LibmanBrabham, Vance W, MD;  [facility];  Service: Cardiovascular;  Laterality: Right;  . NM MYOVIEW LTD  2015  . PERIPHERAL VASCULAR ATHERECTOMY Right 12/08/2016   Procedure: Peripheral Vascular Atherectomy;  Surgeon: Nada LibmanVance W Brabham, MD;  Location: [facility];  Service: Cardiovascular;  Laterality: Right;  . PERIPHERAL VASCULAR BALLOON ANGIOPLASTY Right 12/08/2016   Procedure: Peripheral Vascular Balloon Angioplasty;  Surgeon: Nada LibmanVance W Brabham, MD;  Location: [facility];  Service: Cardiovascular;  Laterality: Right;  . PERIPHERAL VASCULAR BALLOON ANGIOPLASTY Right  04/20/2017   Procedure: PERIPHERAL VASCULAR BALLOON ANGIOPLASTY;  Surgeon: Nada LibmanBrabham, Vance W, MD;  Location: [facility];  Service: Cardiovascular;  Laterality: Right;  SFA  . PERIPHERAL VASCULAR CATHETERIZATION N/A 09/22/2016   Procedure: Abdominal Aortogram w/ bilateral Lower Extremity Runoff;  Surgeon: Nada LibmanVance W Brabham, MD;  Location: [facility];  Service: Cardiovascular;  Laterality: N/A;  . PERIPHERAL VASCULAR CATHETERIZATION Right 09/22/2016   Procedure: Peripheral Vascular Intervention;  Surgeon: Nada LibmanVance W Brabham, MD;  Location: [facility];  Service: Cardiovascular;  Laterality: Right;    Allergies  Allergen Reactions  . Actos [Pioglitazone] Shortness Of Breath    Leg swelling   . Lisinopril Cough    Current Outpatient Medications  Medication Sig Dispense Refill  . albuterol (PROVENTIL HFA;VENTOLIN HFA) 108 (90 Base) MCG/ACT inhaler Inhale 2 puffs into the lungs every 4 (four) hours as needed for wheezing or shortness of breath.     Marland Kitchen. albuterol (PROVENTIL) (2.5 MG/3ML) 0.083% nebulizer solution Inhale 3 mLs into the lungs every 3 (three) hours as needed for shortness of breath.   1  . aspirin EC 325 MG tablet Take 650 mg by mouth daily as needed for mild pain.     Marland Kitchen. clopidogrel (PLAVIX) 75 MG tablet Take 75 mg by mouth daily.     . Cyanocobalamin (VITAMIN B-12 IJ) Inject 1 Dose as directed every 30 (thirty) days.     . furosemide (LASIX) 40 MG tablet Take 40 mg by mouth daily as needed for edema.    Marland Kitchen. glipiZIDE (GLUCOTROL XL) 10 MG 24 hr tablet Take 10 mg by mouth 2 (two) times daily.     . Homeopathic Products (LEG CRAMP RELIEF SL) Place 2 tablets under the tongue at bedtime as needed (leg cramps).     Marland Kitchen. ibuprofen (ADVIL,MOTRIN) 200 MG tablet Take 800 mg by mouth every 8 (eight) hours as needed for moderate pain.    Marland Kitchen. irbesartan (AVAPRO) 300 MG tablet Take 300 mg by mouth daily.     . Lidocaine-Glycerin (PREPARATION H RE) Place 1 application rectally daily as  needed (hemorrhoids).    . metFORMIN (GLUCOPHAGE) 500 MG tablet Take 1,000 mg by mouth 2 (two) times daily with a meal.     . naproxen sodium (ANAPROX) 220 MG tablet Take 660 mg by mouth daily as needed (pain).    Marland Kitchen. neomycin-bacitracin-polymyxin (NEOSPORIN) ointment Apply 1 application topically daily as needed for wound care.    Marland Kitchen. NITROSTAT 0.4 MG SL tablet Take 0.4 mg by mouth every 5 (five) minutes as needed for chest pain.   0  . Polyvinyl Alcohol-Povidone (MURINE TEARS FOR DRY EYES OP) Apply 1 drop to eye 3 (three) times daily as needed (dry eyes).    . Pseudoeph-Doxylamine-DM-APAP (NYQUIL PO) Take 1 Dose by mouth at bedtime.     . ranitidine (  ZANTAC) 150 MG tablet Take 150 mg by mouth daily.    . simvastatin (ZOCOR) 40 MG tablet Take 40 mg by mouth every evening.    Marland Kitchen SPIRIVA HANDIHALER 18 MCG inhalation capsule Place 18 mcg into inhaler and inhale daily.     . traMADol (ULTRAM) 50 MG tablet Take 50 mg by mouth 3 (three) times daily as needed for severe pain.     . vitamin B-12 (CYANOCOBALAMIN) 1000 MCG tablet Take 1,000 mcg by mouth daily as needed (tingling).     No current facility-administered medications for this visit.     ROS: See HPI for pertinent positives and negatives.   Physical Examination  Vitals:   07/20/17 1525 07/20/17 1529  BP: 138/67 117/61  Pulse: (!) 132   Resp: 19   Temp: 97.7 F (36.5 C)   TempSrc: Oral   SpO2: 99%   Weight: 127 lb 8 oz (57.8 kg)    Body mass index is 24.09 kg/m.  General: A&O x 3, WDWN, small male. Gait: normal Eyes: PERRLA. Pulmonary: Respirations are non labored, CTAB, good air movement Cardiac: regular Rhythm, tachycardic at 110 (recheck of heart rate), no detected murmur.         Carotid Bruits Right Left   Negative Negative   Radial pulses: right is 2+ palpable, left radial artery was harvested for use in CABG, left brachial pulse is 1+ palpable.  Adominal aortic pulse is not palpable                         VASCULAR  EXAM: Extremities without ischemic changes, without Gangrene; with open wounds.                                                                                                          LE Pulses Right Left       FEMORAL  1+ palpable  1+ palpable        POPLITEAL  not palpable   not palpable       POSTERIOR TIBIAL  not palpable   not palpable        DORSALIS PEDIS      ANTERIOR TIBIAL not palpable  not palpable    Abdomen: soft, NT, no palpable masses. Skin: no rashes, see Extremities. Musculoskeletal: no muscle wasting or atrophy.  Neurologic: A&O X 3; Appropriate Affect ; SENSATION: normal; MOTOR FUNCTION:  moving all extremities equally, motor strength 5/5 throughout, unable to raise left arm over about 15%. Speech is fluent/normal/edentulous. CN 2-12 intact.    ASSESSMENT: Reginald Ayers is a 66 y.o. male who is s/p drug coated balloon angioplasty of right superficial femoral artery, and balloon angioplasty of right common femoral artery on 04-20-17 by Dr. Myra Gianotti for in-stent stenosis. The patient has previously undergone percutaneous revascularization.     DATA  Right LE Arterial Duplex (07/20/17): Evidence of short segment occlusion of the proximal to mid segment of the stent is noted, with no color flow or Doppler signals noted. Evidence of 50-99% stenosis in the  distal end of the stent. This is the first post intervention exam.   ABI (Date: 07/20/2017):  R:   ABI: 0.60 (was 0.64 on 04-15-17),   PT: mono  DP: mono  TBI:  0.24  L:   ABI: 0.44 (was 0.51),   PT: mono  DP: mono  TBI: 0.28  Decline in bilateral ABI since the angioplasty of right SFA stent.    Carotid Duplex (04-15-17): Right ICA: 60-79% stenosis (high end of range) Left ICA: occluded.   PLAN:  The patient was counseled re smoking cessation and given several free resources re smoking cessation.   Based on the patient's vascular studies and examination, pt will return to clinic in  1-2 weeks to discuss with Dr. Myra GianottiBrabham how to address segments of occlusion of right SFA stent. He will need his carotid arteries duplexed about February 2019; has left ICA occlusion and high end 6-79% right ICA stenosis, no hx of stroke or TIA.    I discussed in depth with the patient the nature of atherosclerosis, and emphasized the importance of maximal medical management including strict control of blood pressure, blood glucose, and lipid levels, obtaining regular exercise, and cessation of smoking.  The patient is aware that without maximal medical management the underlying atherosclerotic disease process will progress, limiting the benefit of any interventions.  The patient was given information about PAD including signs, symptoms, treatment, what symptoms should prompt the patient to seek immediate medical care, and risk reduction measures to take.  Charisse MarchSuzanne Nickel, RN, MSN, FNP-C [facility] [place] Office Phone: [phone]  Clinic MD: Early  07/20/17 3:31 PM

## 2017-07-22 DIAGNOSIS — L97812 Non-pressure chronic ulcer of other part of right lower leg with fat layer exposed: Secondary | ICD-10-CM | POA: Diagnosis not present

## 2017-07-22 DIAGNOSIS — Z4800 Encounter for change or removal of nonsurgical wound dressing: Secondary | ICD-10-CM | POA: Diagnosis not present

## 2017-07-22 DIAGNOSIS — Z7984 Long term (current) use of oral hypoglycemic drugs: Secondary | ICD-10-CM | POA: Diagnosis not present

## 2017-07-22 DIAGNOSIS — E1151 Type 2 diabetes mellitus with diabetic peripheral angiopathy without gangrene: Secondary | ICD-10-CM | POA: Diagnosis not present

## 2017-07-22 DIAGNOSIS — L97312 Non-pressure chronic ulcer of right ankle with fat layer exposed: Secondary | ICD-10-CM | POA: Diagnosis not present

## 2017-07-22 DIAGNOSIS — E11622 Type 2 diabetes mellitus with other skin ulcer: Secondary | ICD-10-CM | POA: Diagnosis not present

## 2017-07-26 DIAGNOSIS — L97812 Non-pressure chronic ulcer of other part of right lower leg with fat layer exposed: Secondary | ICD-10-CM | POA: Diagnosis not present

## 2017-07-26 DIAGNOSIS — Z4800 Encounter for change or removal of nonsurgical wound dressing: Secondary | ICD-10-CM | POA: Diagnosis not present

## 2017-07-26 DIAGNOSIS — E11622 Type 2 diabetes mellitus with other skin ulcer: Secondary | ICD-10-CM | POA: Diagnosis not present

## 2017-07-26 DIAGNOSIS — E1151 Type 2 diabetes mellitus with diabetic peripheral angiopathy without gangrene: Secondary | ICD-10-CM | POA: Diagnosis not present

## 2017-07-26 DIAGNOSIS — Z7984 Long term (current) use of oral hypoglycemic drugs: Secondary | ICD-10-CM | POA: Diagnosis not present

## 2017-07-26 DIAGNOSIS — L97312 Non-pressure chronic ulcer of right ankle with fat layer exposed: Secondary | ICD-10-CM | POA: Diagnosis not present

## 2017-07-29 DIAGNOSIS — E11622 Type 2 diabetes mellitus with other skin ulcer: Secondary | ICD-10-CM | POA: Diagnosis not present

## 2017-07-29 DIAGNOSIS — L97812 Non-pressure chronic ulcer of other part of right lower leg with fat layer exposed: Secondary | ICD-10-CM | POA: Diagnosis not present

## 2017-07-29 DIAGNOSIS — Z4800 Encounter for change or removal of nonsurgical wound dressing: Secondary | ICD-10-CM | POA: Diagnosis not present

## 2017-07-29 DIAGNOSIS — L97312 Non-pressure chronic ulcer of right ankle with fat layer exposed: Secondary | ICD-10-CM | POA: Diagnosis not present

## 2017-07-29 DIAGNOSIS — Z7984 Long term (current) use of oral hypoglycemic drugs: Secondary | ICD-10-CM | POA: Diagnosis not present

## 2017-07-29 DIAGNOSIS — E1151 Type 2 diabetes mellitus with diabetic peripheral angiopathy without gangrene: Secondary | ICD-10-CM | POA: Diagnosis not present

## 2017-08-02 ENCOUNTER — Ambulatory Visit: Payer: Medicare Other | Admitting: Surgery

## 2017-08-05 ENCOUNTER — Encounter (HOSPITAL_BASED_OUTPATIENT_CLINIC_OR_DEPARTMENT_OTHER): Payer: Medicare Other | Attending: Internal Medicine

## 2017-08-05 DIAGNOSIS — J449 Chronic obstructive pulmonary disease, unspecified: Secondary | ICD-10-CM | POA: Diagnosis not present

## 2017-08-05 DIAGNOSIS — L97812 Non-pressure chronic ulcer of other part of right lower leg with fat layer exposed: Secondary | ICD-10-CM | POA: Diagnosis not present

## 2017-08-05 DIAGNOSIS — I10 Essential (primary) hypertension: Secondary | ICD-10-CM | POA: Insufficient documentation

## 2017-08-05 DIAGNOSIS — L97512 Non-pressure chronic ulcer of other part of right foot with fat layer exposed: Secondary | ICD-10-CM | POA: Diagnosis not present

## 2017-08-05 DIAGNOSIS — L928 Other granulomatous disorders of the skin and subcutaneous tissue: Secondary | ICD-10-CM | POA: Diagnosis not present

## 2017-08-05 DIAGNOSIS — E1151 Type 2 diabetes mellitus with diabetic peripheral angiopathy without gangrene: Secondary | ICD-10-CM | POA: Insufficient documentation

## 2017-08-05 DIAGNOSIS — E11622 Type 2 diabetes mellitus with other skin ulcer: Secondary | ICD-10-CM | POA: Diagnosis not present

## 2017-08-05 DIAGNOSIS — I2510 Atherosclerotic heart disease of native coronary artery without angina pectoris: Secondary | ICD-10-CM | POA: Insufficient documentation

## 2017-08-05 DIAGNOSIS — E11621 Type 2 diabetes mellitus with foot ulcer: Secondary | ICD-10-CM | POA: Diagnosis not present

## 2017-08-05 DIAGNOSIS — Z9221 Personal history of antineoplastic chemotherapy: Secondary | ICD-10-CM | POA: Diagnosis not present

## 2017-08-09 DIAGNOSIS — L97812 Non-pressure chronic ulcer of other part of right lower leg with fat layer exposed: Secondary | ICD-10-CM | POA: Diagnosis not present

## 2017-08-09 DIAGNOSIS — L97312 Non-pressure chronic ulcer of right ankle with fat layer exposed: Secondary | ICD-10-CM | POA: Diagnosis not present

## 2017-08-09 DIAGNOSIS — Z4800 Encounter for change or removal of nonsurgical wound dressing: Secondary | ICD-10-CM | POA: Diagnosis not present

## 2017-08-09 DIAGNOSIS — Z7984 Long term (current) use of oral hypoglycemic drugs: Secondary | ICD-10-CM | POA: Diagnosis not present

## 2017-08-09 DIAGNOSIS — E1151 Type 2 diabetes mellitus with diabetic peripheral angiopathy without gangrene: Secondary | ICD-10-CM | POA: Diagnosis not present

## 2017-08-09 DIAGNOSIS — E11622 Type 2 diabetes mellitus with other skin ulcer: Secondary | ICD-10-CM | POA: Diagnosis not present

## 2017-08-12 ENCOUNTER — Inpatient Hospital Stay (HOSPITAL_COMMUNITY)
Admission: EM | Admit: 2017-08-12 | Discharge: 2017-08-15 | DRG: 871 | Disposition: A | Discharge status: Home / Self Care | Payer: Medicare Other | Attending: Nephrology | Admitting: Nephrology

## 2017-08-12 ENCOUNTER — Emergency Department (HOSPITAL_COMMUNITY): Payer: Medicare Other

## 2017-08-12 ENCOUNTER — Other Ambulatory Visit: Payer: Self-pay

## 2017-08-12 ENCOUNTER — Encounter (HOSPITAL_COMMUNITY): Payer: Self-pay

## 2017-08-12 DIAGNOSIS — J181 Lobar pneumonia, unspecified organism: Secondary | ICD-10-CM | POA: Diagnosis not present

## 2017-08-12 DIAGNOSIS — L97819 Non-pressure chronic ulcer of other part of right lower leg with unspecified severity: Secondary | ICD-10-CM | POA: Diagnosis present

## 2017-08-12 DIAGNOSIS — E785 Hyperlipidemia, unspecified: Secondary | ICD-10-CM | POA: Diagnosis present

## 2017-08-12 DIAGNOSIS — F17210 Nicotine dependence, cigarettes, uncomplicated: Secondary | ICD-10-CM | POA: Diagnosis present

## 2017-08-12 DIAGNOSIS — Z791 Long term (current) use of non-steroidal anti-inflammatories (NSAID): Secondary | ICD-10-CM | POA: Diagnosis not present

## 2017-08-12 DIAGNOSIS — L97312 Non-pressure chronic ulcer of right ankle with fat layer exposed: Secondary | ICD-10-CM | POA: Diagnosis not present

## 2017-08-12 DIAGNOSIS — E872 Acidosis: Secondary | ICD-10-CM | POA: Diagnosis present

## 2017-08-12 DIAGNOSIS — E11622 Type 2 diabetes mellitus with other skin ulcer: Secondary | ICD-10-CM | POA: Diagnosis present

## 2017-08-12 DIAGNOSIS — Z95820 Peripheral vascular angioplasty status with implants and grafts: Secondary | ICD-10-CM | POA: Diagnosis not present

## 2017-08-12 DIAGNOSIS — I10 Essential (primary) hypertension: Secondary | ICD-10-CM | POA: Diagnosis present

## 2017-08-12 DIAGNOSIS — E1151 Type 2 diabetes mellitus with diabetic peripheral angiopathy without gangrene: Secondary | ICD-10-CM | POA: Diagnosis present

## 2017-08-12 DIAGNOSIS — E876 Hypokalemia: Secondary | ICD-10-CM | POA: Diagnosis present

## 2017-08-12 DIAGNOSIS — Z888 Allergy status to other drugs, medicaments and biological substances status: Secondary | ICD-10-CM

## 2017-08-12 DIAGNOSIS — I779 Disorder of arteries and arterioles, unspecified: Secondary | ICD-10-CM | POA: Diagnosis present

## 2017-08-12 DIAGNOSIS — I2510 Atherosclerotic heart disease of native coronary artery without angina pectoris: Secondary | ICD-10-CM | POA: Diagnosis present

## 2017-08-12 DIAGNOSIS — I739 Peripheral vascular disease, unspecified: Secondary | ICD-10-CM | POA: Diagnosis present

## 2017-08-12 DIAGNOSIS — Z951 Presence of aortocoronary bypass graft: Secondary | ICD-10-CM

## 2017-08-12 DIAGNOSIS — J440 Chronic obstructive pulmonary disease with acute lower respiratory infection: Secondary | ICD-10-CM | POA: Diagnosis present

## 2017-08-12 DIAGNOSIS — Z7902 Long term (current) use of antithrombotics/antiplatelets: Secondary | ICD-10-CM | POA: Diagnosis not present

## 2017-08-12 DIAGNOSIS — L97812 Non-pressure chronic ulcer of other part of right lower leg with fat layer exposed: Secondary | ICD-10-CM | POA: Diagnosis not present

## 2017-08-12 DIAGNOSIS — R000 Tachycardia, unspecified: Secondary | ICD-10-CM | POA: Diagnosis not present

## 2017-08-12 DIAGNOSIS — L98419 Non-pressure chronic ulcer of buttock with unspecified severity: Secondary | ICD-10-CM | POA: Diagnosis present

## 2017-08-12 DIAGNOSIS — Z7982 Long term (current) use of aspirin: Secondary | ICD-10-CM

## 2017-08-12 DIAGNOSIS — E119 Type 2 diabetes mellitus without complications: Secondary | ICD-10-CM | POA: Diagnosis not present

## 2017-08-12 DIAGNOSIS — A419 Sepsis, unspecified organism: Secondary | ICD-10-CM | POA: Diagnosis not present

## 2017-08-12 DIAGNOSIS — Z79899 Other long term (current) drug therapy: Secondary | ICD-10-CM

## 2017-08-12 DIAGNOSIS — J189 Pneumonia, unspecified organism: Secondary | ICD-10-CM

## 2017-08-12 DIAGNOSIS — R05 Cough: Secondary | ICD-10-CM | POA: Diagnosis not present

## 2017-08-12 DIAGNOSIS — Z7984 Long term (current) use of oral hypoglycemic drugs: Secondary | ICD-10-CM

## 2017-08-12 DIAGNOSIS — D649 Anemia, unspecified: Secondary | ICD-10-CM | POA: Diagnosis present

## 2017-08-12 DIAGNOSIS — Z4800 Encounter for change or removal of nonsurgical wound dressing: Secondary | ICD-10-CM | POA: Diagnosis not present

## 2017-08-12 LAB — CBC WITH DIFFERENTIAL/PLATELET
Basophils Absolute: 0 10*3/uL (ref 0.0–0.1)
Basophils Relative: 0 %
EOS PCT: 0 %
Eosinophils Absolute: 0.1 10*3/uL (ref 0.0–0.7)
HEMATOCRIT: 29.3 % — AB (ref 39.0–52.0)
Hemoglobin: 9.7 g/dL — ABNORMAL LOW (ref 13.0–17.0)
LYMPHS ABS: 1.3 10*3/uL (ref 0.7–4.0)
LYMPHS PCT: 11 %
MCH: 29.6 pg (ref 26.0–34.0)
MCHC: 33.1 g/dL (ref 30.0–36.0)
MCV: 89.3 fL (ref 78.0–100.0)
MONO ABS: 1.5 10*3/uL — AB (ref 0.1–1.0)
Monocytes Relative: 13 %
NEUTROS ABS: 9 10*3/uL — AB (ref 1.7–7.7)
Neutrophils Relative %: 76 %
PLATELETS: 209 10*3/uL (ref 150–400)
RBC: 3.28 MIL/uL — AB (ref 4.22–5.81)
RDW: 13.8 % (ref 11.5–15.5)
WBC: 11.9 10*3/uL — AB (ref 4.0–10.5)

## 2017-08-12 LAB — URINALYSIS, ROUTINE W REFLEX MICROSCOPIC
BILIRUBIN URINE: NEGATIVE
Glucose, UA: NEGATIVE mg/dL
KETONES UR: NEGATIVE mg/dL
Nitrite: NEGATIVE
PROTEIN: NEGATIVE mg/dL
SPECIFIC GRAVITY, URINE: 1.01 (ref 1.005–1.030)
SQUAMOUS EPITHELIAL / LPF: NONE SEEN
pH: 5 (ref 5.0–8.0)

## 2017-08-12 LAB — COMPREHENSIVE METABOLIC PANEL
ALBUMIN: 3.4 g/dL — AB (ref 3.5–5.0)
ALT: 17 U/L (ref 17–63)
ANION GAP: 12 (ref 5–15)
AST: 26 U/L (ref 15–41)
Alkaline Phosphatase: 53 U/L (ref 38–126)
BUN: 17 mg/dL (ref 6–20)
CHLORIDE: 97 mmol/L — AB (ref 101–111)
CO2: 23 mmol/L (ref 22–32)
Calcium: 8.5 mg/dL — ABNORMAL LOW (ref 8.9–10.3)
Creatinine, Ser: 1.08 mg/dL (ref 0.61–1.24)
GFR calc Af Amer: >60 mL/min (ref >60)
GFR calc non Af Amer: >60 mL/min (ref >60)
GLUCOSE: 191 mg/dL — AB (ref 65–99)
POTASSIUM: 3.4 mmol/L — AB (ref 3.5–5.1)
Sodium: 132 mmol/L — ABNORMAL LOW (ref 135–145)
Total Bilirubin: 0.4 mg/dL (ref 0.3–1.2)
Total Protein: 6.4 g/dL — ABNORMAL LOW (ref 6.5–8.1)

## 2017-08-12 LAB — LACTIC ACID, PLASMA: Lactic Acid, Venous: 1.4 mmol/L (ref 0.5–1.9)

## 2017-08-12 LAB — INFLUENZA PANEL BY PCR (TYPE A & B)
INFLAPCR: NEGATIVE
INFLBPCR: NEGATIVE

## 2017-08-12 LAB — I-STAT CG4 LACTIC ACID, ED
Lactic Acid, Venous: 2.51 mmol/L (ref 0.5–1.9)
Lactic Acid, Venous: 2.11 mmol/L (ref 0.5–1.9)

## 2017-08-12 LAB — GLUCOSE, CAPILLARY: Glucose-Capillary: 145 mg/dL — ABNORMAL HIGH (ref 65–99)

## 2017-08-12 IMAGING — CR DG CHEST 2V
2 series · 2 of 2 positions shown · non-contrast
Comparison: Screening CT chest [DATE]. Chest x-rays [DATE]
and earlier.

CLINICAL DATA: One-week history of productive cough. Fever of 102
degrees Fahrenheit noted when the patient was at the [REDACTED] earlier today getting treatment for a right foot ulcer.

EXAM:
CHEST  2 VIEW

[w chest lat]
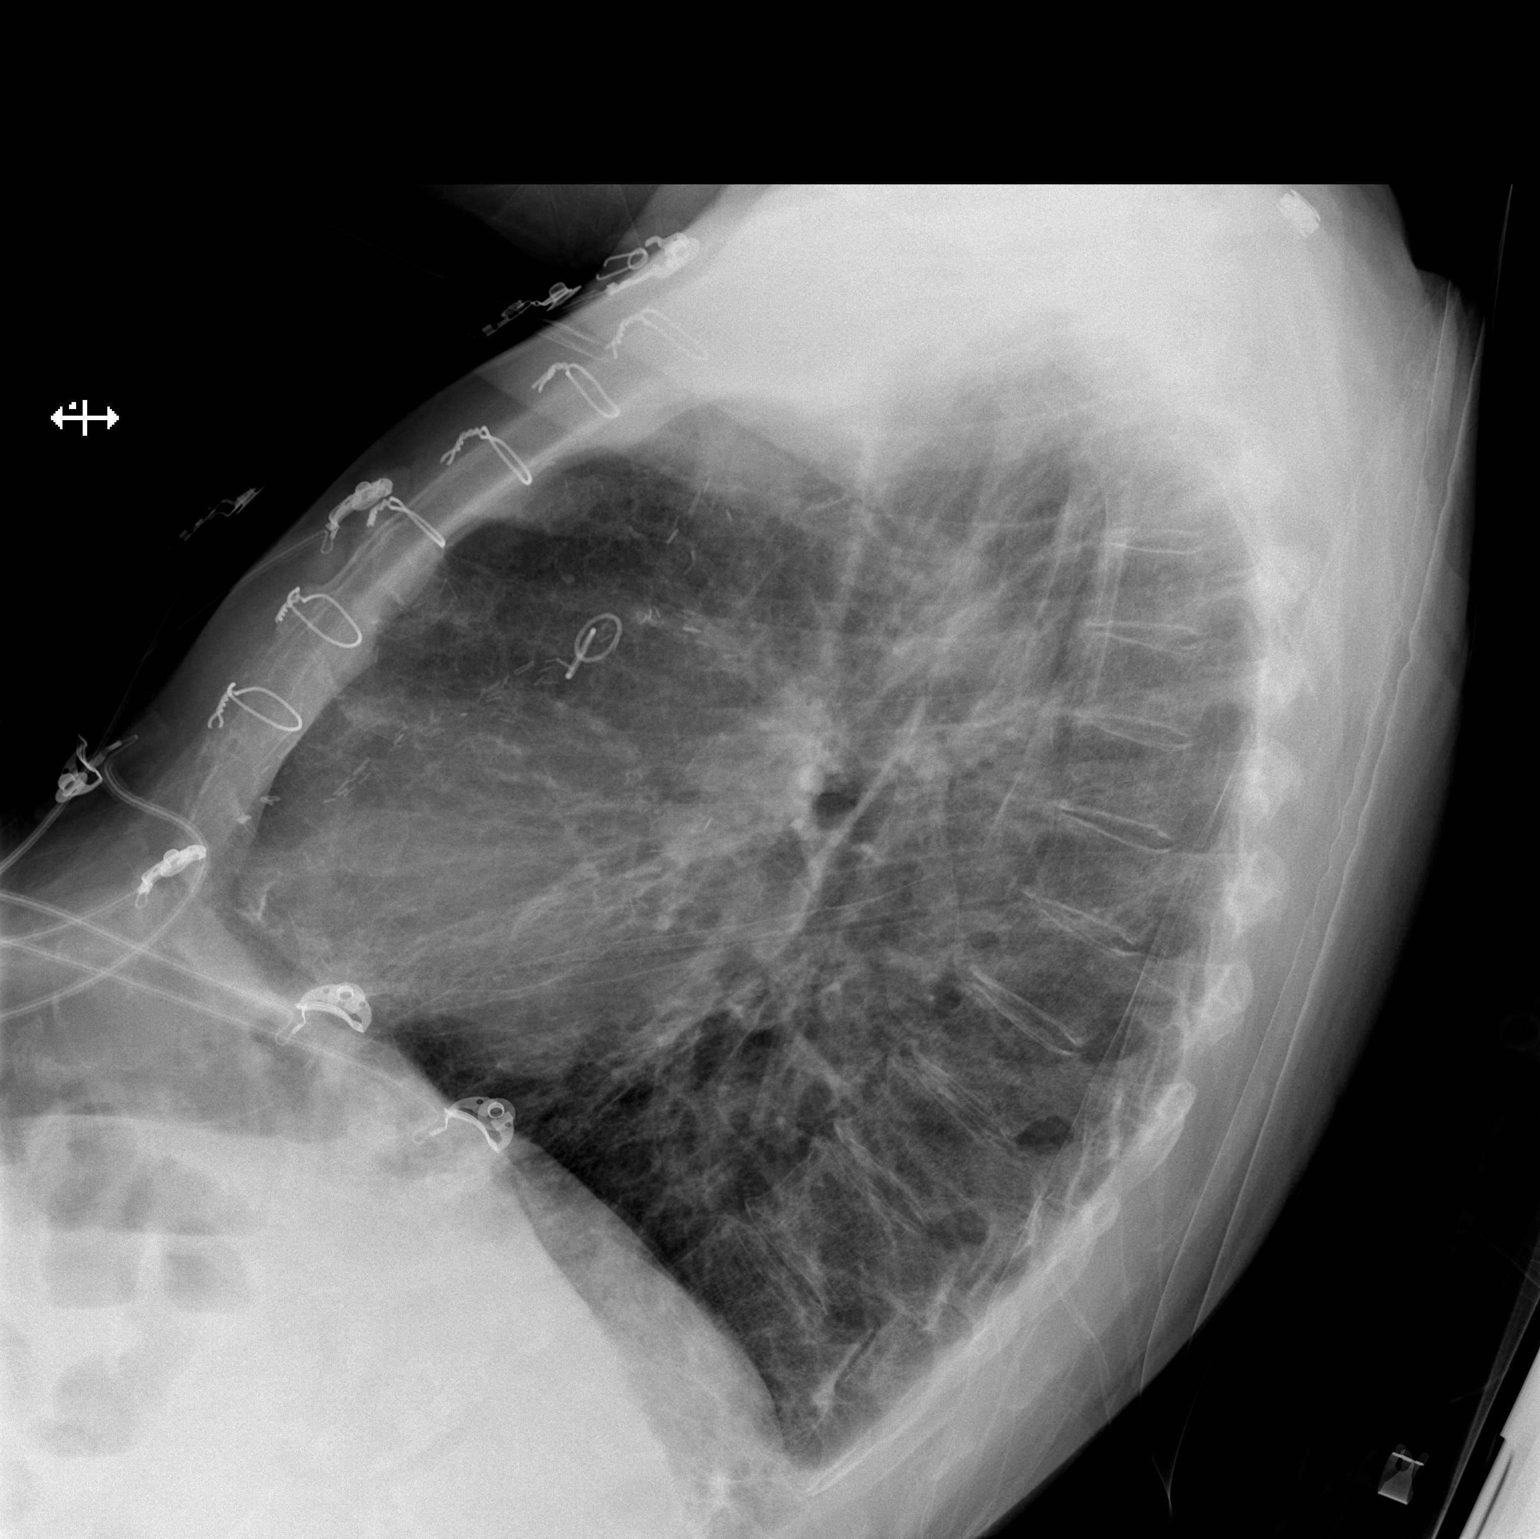

[x chest ap]
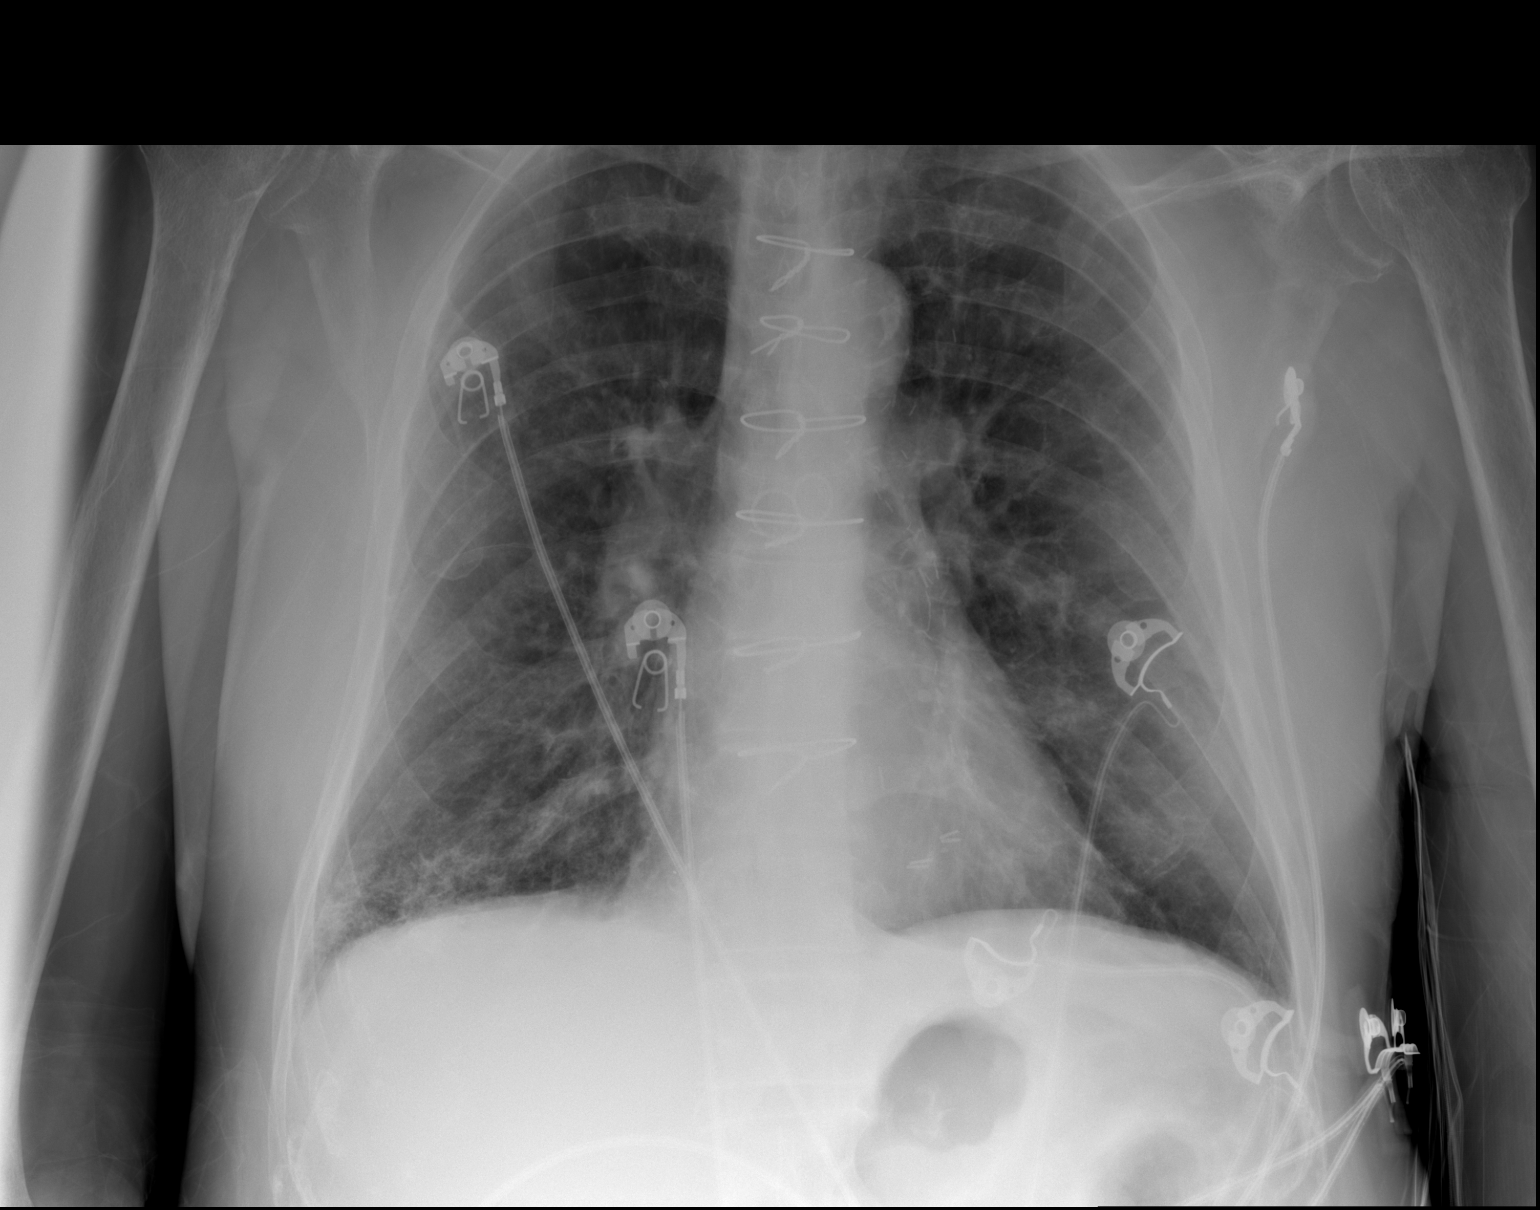

[2 of 2 positions shown; findings below may reference images not displayed]

FINDINGS: Prior sternotomy for CABG. Cardiac silhouette normal in size,
unchanged. Thoracic aorta atherosclerotic, unchanged. Hilar and
mediastinal contours otherwise unremarkable. Emphysematous changes
throughout both lungs. Patchy opacities in the right middle lobe,
more so than on the prior examinations, superimposed upon the scar
and bronchiectasis identified on the CT. Lungs otherwise clear. No
pleural effusions. Degenerative changes involving the thoracic
spine.
IMPRESSION: Right middle lobe bronchopneumonia is suspected, superimposed upon
scar/bronchiectasis which was identified on the prior CT.

## 2017-08-12 MED ORDER — TRAMADOL HCL 50 MG PO TABS
50.0000 mg | ORAL_TABLET | Freq: Three times a day (TID) | ORAL | Status: DC | PRN
  Administered 2017-08-14: 50 mg via ORAL
  Filled 2017-08-12: qty 1

## 2017-08-12 MED ORDER — PIPERACILLIN-TAZOBACTAM 3.375 G IVPB 30 MIN
3.3750 g | Freq: Once | INTRAVENOUS | Status: AC
  Administered 2017-08-12: 3.375 g via INTRAVENOUS
  Filled 2017-08-12: qty 50

## 2017-08-12 MED ORDER — GLIPIZIDE ER 10 MG PO TB24
10.0000 mg | ORAL_TABLET | Freq: Two times a day (BID) | ORAL | Status: DC
  Administered 2017-08-13 – 2017-08-15 (×5): 10 mg via ORAL
  Filled 2017-08-12 (×5): qty 1

## 2017-08-12 MED ORDER — ASPIRIN EC 325 MG PO TBEC: 650.0000 mg | DELAYED_RELEASE_TABLET | Freq: Every day | ORAL | Status: DC | PRN

## 2017-08-12 MED ORDER — ACETAMINOPHEN 325 MG PO TABS
650.0000 mg | ORAL_TABLET | Freq: Four times a day (QID) | ORAL | Status: DC | PRN
  Administered 2017-08-13: 650 mg via ORAL
  Filled 2017-08-12: qty 2

## 2017-08-12 MED ORDER — CLOPIDOGREL BISULFATE 75 MG PO TABS
75.0000 mg | ORAL_TABLET | Freq: Every day | ORAL | Status: DC
  Administered 2017-08-13 – 2017-08-15 (×3): 75 mg via ORAL
  Filled 2017-08-12 (×3): qty 1

## 2017-08-12 MED ORDER — VITAMIN B-12 1000 MCG PO TABS: 1000.0000 ug | ORAL_TABLET | Freq: Every day | ORAL | Status: DC | PRN

## 2017-08-12 MED ORDER — SODIUM CHLORIDE 0.9 % IV SOLN
INTRAVENOUS | Status: DC
  Administered 2017-08-12 – 2017-08-13 (×2): via INTRAVENOUS

## 2017-08-12 MED ORDER — DEXTROSE 5 % IV SOLN
1.0000 g | INTRAVENOUS | Status: DC
  Administered 2017-08-12 – 2017-08-14 (×3): 1 g via INTRAVENOUS
  Filled 2017-08-12 (×4): qty 10

## 2017-08-12 MED ORDER — SODIUM CHLORIDE 0.9 % IV BOLUS (SEPSIS)
500.0000 mL | Freq: Once | INTRAVENOUS | Status: AC
  Administered 2017-08-12: 500 mL via INTRAVENOUS

## 2017-08-12 MED ORDER — DEXTROSE 5 % IV SOLN
500.0000 mg | INTRAVENOUS | Status: DC
  Administered 2017-08-12 – 2017-08-13 (×2): 500 mg via INTRAVENOUS
  Filled 2017-08-12 (×2): qty 500

## 2017-08-12 MED ORDER — SODIUM CHLORIDE 0.9 % IV BOLUS (SEPSIS)
1000.0000 mL | Freq: Once | INTRAVENOUS | Status: AC
  Administered 2017-08-12: 1000 mL via INTRAVENOUS

## 2017-08-12 MED ORDER — ONDANSETRON HCL 4 MG/2ML IJ SOLN: 4.0000 mg | Freq: Four times a day (QID) | INTRAMUSCULAR | Status: DC | PRN

## 2017-08-12 MED ORDER — TIOTROPIUM BROMIDE MONOHYDRATE 18 MCG IN CAPS
18.0000 ug | ORAL_CAPSULE | Freq: Every day | RESPIRATORY_TRACT | Status: DC
  Administered 2017-08-12 – 2017-08-14 (×3): 18 ug via RESPIRATORY_TRACT
  Filled 2017-08-12: qty 5

## 2017-08-12 MED ORDER — INSULIN ASPART 100 UNIT/ML ~~LOC~~ SOLN
0.0000 [IU] | Freq: Three times a day (TID) | SUBCUTANEOUS | Status: DC
  Administered 2017-08-13 – 2017-08-14 (×3): 2 [IU] via SUBCUTANEOUS
  Administered 2017-08-14 (×2): 1 [IU] via SUBCUTANEOUS
  Administered 2017-08-15: 3 [IU] via SUBCUTANEOUS
  Administered 2017-08-15: 2 [IU] via SUBCUTANEOUS

## 2017-08-12 MED ORDER — ALBUTEROL SULFATE (2.5 MG/3ML) 0.083% IN NEBU
3.0000 mL | INHALATION_SOLUTION | RESPIRATORY_TRACT | Status: DC | PRN
  Administered 2017-08-12 – 2017-08-13 (×2): 3 mL via RESPIRATORY_TRACT
  Filled 2017-08-12 (×2): qty 3

## 2017-08-12 MED ORDER — ACETAMINOPHEN 650 MG RE SUPP: 650.0000 mg | Freq: Four times a day (QID) | RECTAL | Status: DC | PRN

## 2017-08-12 MED ORDER — VANCOMYCIN HCL IN DEXTROSE 1-5 GM/200ML-% IV SOLN
1000.0000 mg | Freq: Once | INTRAVENOUS | Status: AC
  Administered 2017-08-12: 1000 mg via INTRAVENOUS
  Filled 2017-08-12: qty 200

## 2017-08-12 MED ORDER — ONDANSETRON HCL 4 MG PO TABS: 4.0000 mg | ORAL_TABLET | Freq: Four times a day (QID) | ORAL | Status: DC | PRN

## 2017-08-12 MED ORDER — IRBESARTAN 300 MG PO TABS
300.0000 mg | ORAL_TABLET | Freq: Every day | ORAL | Status: DC
  Administered 2017-08-13 – 2017-08-15 (×3): 300 mg via ORAL
  Filled 2017-08-12 (×3): qty 1

## 2017-08-12 MED ORDER — ENOXAPARIN SODIUM 40 MG/0.4ML ~~LOC~~ SOLN
40.0000 mg | Freq: Every day | SUBCUTANEOUS | Status: DC
  Administered 2017-08-12 – 2017-08-14 (×3): 40 mg via SUBCUTANEOUS
  Filled 2017-08-12 (×3): qty 0.4

## 2017-08-12 MED ORDER — SODIUM CHLORIDE 0.9 % IV BOLUS (SEPSIS)
250.0000 mL | Freq: Once | INTRAVENOUS | Status: AC
  Administered 2017-08-12: 250 mL via INTRAVENOUS

## 2017-08-12 MED ORDER — SIMVASTATIN 40 MG PO TABS
40.0000 mg | ORAL_TABLET | Freq: Every evening | ORAL | Status: DC
  Administered 2017-08-12 – 2017-08-14 (×3): 40 mg via ORAL
  Filled 2017-08-12 (×3): qty 1

## 2017-08-12 NOTE — Progress Notes (Signed)
A consult was received from an ED physician for vancomycin and zosyn per pharmacy dosing.  The patient's profile has been reviewed for ht/wt/allergies/indication/available labs.   A one time order has been placed for vancomycin 1g and zosyn 3.375g.    Further antibiotics/pharmacy consults should be ordered by admitting physician if indicated.                       Thank you, Loralee PacasErin Elisha Cooksey, PharmD, BCPS 08/12/2017  6:30 PM

## 2017-08-12 NOTE — H&P (Signed)
History and Physical    Reginald Ayers Canterbury JXB:[id number] DOB: [birth year] DOA: 08/12/2017  PCP: Renford DillsPolite, Ronald, MD  Patient coming from: Home.  Chief Complaint: Fever and cough.  HPI: Reginald Ayers Buser is a 66 y.o. male with history of CAD status post CABG, COPD, peripheral arterial disease with chronic right lower extremity wound, diabetes mellitus type 2, hypertension was advised to come to the ER by patient's home health aide after patient was found to be febrile.  Patient states over the last 1 week patient has been having fever chills with productive cough.  Patient's phlegm was discolored.  Has had some pleuritic type of chest pain.  Denies any nausea vomiting abdominal pain diarrhea.  As per the patient patient had a fever of 102 F at home.  ED Course: In the ER patient was tachycardic labs showed elevated lactic levels leukocytosis and chest x-ray showed bronchopneumonia with UA showing possible UTI.  Patient was started on sepsis protocol and fluids following which patient heart rate improved.  Blood cultures and urine cultures obtained started on ceftriaxone and Zithromax for community-acquired pneumonia and admitted for further management of sepsis.  Review of Systems: As per HPI, rest all negative.   Past Medical History:  Diagnosis Date  . Carotid artery disease (HCC)    L-ICA 100%, mod R-ICA dz  . COPD (chronic obstructive pulmonary disease) (HCC)   . Coronary artery disease    s/p CABG February 2010 by Dr. Andrey SpearmanSteve Hendrickson  . Diabetes (HCC)   . Emphysema lung (HCC)   . Hyperlipidemia   . Hypertension   . Peripheral arterial disease (HCC)    post left common iliac and right SFA stenting remotely  . Tobacco abuse         Past Surgical History:  Procedure Laterality Date  . ABDOMINAL AORTOGRAM N/A 04/20/2017   Procedure: ABDOMINAL AORTOGRAM;  Surgeon: Nada LibmanBrabham, Vance W, MD;  Location: [facility];  Service: Cardiovascular;  Laterality: N/A;  . ABDOMINAL AORTOGRAM  W/LOWER EXTREMITY Right 12/08/2016   Procedure: Abdominal Aortogram w/Lower Extremity;  Surgeon: Nada LibmanVance W Brabham, MD;  Location: [facility];  Service: Cardiovascular;  Laterality: Right;  . CARDIAC CATHETERIZATION  2010  . CAROTID ANGIOGRAM  2014  . CORONARY ARTERY BYPASS GRAFT  2010   LIMA-LAD, Lrad-OM1, SVG-RI, SVG-AM-dRCA  . DOPPLER ECHOCARDIOGRAPHY  2010  . LOWER EXTREMITY ANGIOGRAM  11/2006   left common femoral endarterectomy and patch angioplasty:The mid right SFA was angioplastied with a 4 x 8  . LOWER EXTREMITY ANGIOGRAM  2003   left common iliac artery stenting by Dr. Erlene QuanJ. Berry  . LOWER EXTREMITY ANGIOGRAPHY Right 04/20/2017   Procedure: Lower Extremity Angiography;  Surgeon: Nada LibmanBrabham, Vance W, MD;  [facility];  Service: Cardiovascular;  Laterality: Right;  . NM MYOVIEW LTD  2015  . PERIPHERAL VASCULAR ATHERECTOMY Right 12/08/2016   Procedure: Peripheral Vascular Atherectomy;  Surgeon: Nada LibmanVance W Brabham, MD;  Location: [facility];  Service: Cardiovascular;  Laterality: Right;  . PERIPHERAL VASCULAR BALLOON ANGIOPLASTY Right 12/08/2016   Procedure: Peripheral Vascular Balloon Angioplasty;  Surgeon: Nada LibmanVance W Brabham, MD;  Location: [facility];  Service: Cardiovascular;  Laterality: Right;  . PERIPHERAL VASCULAR BALLOON ANGIOPLASTY Right 04/20/2017   Procedure: PERIPHERAL VASCULAR BALLOON ANGIOPLASTY;  Surgeon: Nada LibmanBrabham, Vance W, MD;  Location: [facility];  Service: Cardiovascular;  Laterality: Right;  SFA  . PERIPHERAL VASCULAR CATHETERIZATION N/A 09/22/2016   Procedure: Abdominal Aortogram w/ bilateral  Lower Extremity Runoff;  Surgeon: Nada LibmanVance W Brabham, MD;  [facility];  Service: Cardiovascular;  Laterality: N/A;  . PERIPHERAL VASCULAR CATHETERIZATION Right 09/22/2016   Procedure: Peripheral Vascular Intervention;  Surgeon: Nada LibmanVance W Brabham, MD;  Location: [facility];  Service: Cardiovascular;  Laterality: Right;      reports that he has been smoking cigarettes.  He has been smoking about 0.25 packs per day. His smokeless tobacco use includes chew. He reports that he does not drink alcohol or use drugs.  Allergies  Allergen Reactions  . Actos [Pioglitazone] Shortness Of Breath    Leg swelling   . Lisinopril Cough    Family History  Problem Relation Age of Onset  . Heart attack Mother   . CVA Mother   . Heart disease Mother   . Diabetes Sister   . Hypertension Sister     Prior to Admission medications   Medication Sig Start Date End Date Taking? Authorizing Provider  albuterol (PROVENTIL HFA;VENTOLIN HFA) 108 (90 Base) MCG/ACT inhaler Inhale 2 puffs into the lungs every 4 (four) hours as needed for wheezing or shortness of breath.    Yes [provider]  albuterol (PROVENTIL) (2.5 MG/3ML) 0.083% nebulizer solution Inhale 3 mLs into the lungs every 3 (three) hours as needed for shortness of breath.  06/24/16  Yes [provider]  aspirin EC 325 MG tablet Take 650 mg by mouth daily as needed for mild pain.    Yes [provider]  B Complex Vitamins (B COMPLEX PO) Take 1 tablet by mouth daily.   Yes [provider]  Benzocaine (BOIL-EASE EX) Apply 1 application topically daily as needed (BOIL).   Yes [provider]  clopidogrel (PLAVIX) 75 MG tablet Take 75 mg by mouth daily.  07/24/13  Yes [provider]  Cyanocobalamin (VITAMIN B-12 IJ) Inject 1 Dose as directed every 30 (thirty) days.    Yes [provider]  furosemide (LASIX) 40 MG tablet Take 40 mg by mouth daily as needed for edema.   Yes [provider]  glipiZIDE (GLUCOTROL XL) 10 MG 24 hr tablet Take 10 mg by mouth 2 (two) times daily.  07/24/13  Yes [provider]  Homeopathic Products (LEG CRAMP RELIEF SL) Place 2 tablets under the tongue at bedtime as needed (leg cramps).    Yes [provider]  ibuprofen (ADVIL,MOTRIN) 200 MG tablet Take 800 mg by mouth  every 8 (eight) hours as needed for moderate pain.   Yes [provider]  irbesartan (AVAPRO) 300 MG tablet Take 300 mg by mouth daily.  07/24/13  Yes [provider]  IRON PO Take 1 tablet by mouth daily.   Yes [provider]  metFORMIN (GLUCOPHAGE) 500 MG tablet Take 1,000 mg by mouth 2 (two) times daily with a meal.  07/24/13  Yes [provider]  Multiple Vitamin (MULTIVITAMIN WITH MINERALS) TABS tablet Take 1 tablet by mouth daily.   Yes [provider]  naproxen sodium (ANAPROX) 220 MG tablet Take 660 mg by mouth daily as needed (pain).   Yes [provider]  NITROSTAT 0.4 MG SL tablet Take 0.4 mg by mouth every 5 (five) minutes as needed for chest pain.  12/18/14  Yes [provider]  Polyvinyl Alcohol-Povidone (MURINE TEARS FOR DRY EYES OP) Apply 1 drop to eye 3 (three) times daily as needed (dry eyes).   Yes [provider]  Pseudoeph-Doxylamine-DM-APAP (NYQUIL PO) Take 1 Dose by  mouth at bedtime.    Yes [provider]  simvastatin (ZOCOR) 40 MG tablet Take 40 mg by mouth every evening. 06/13/16  Yes [provider]  SPIRIVA HANDIHALER 18 MCG inhalation capsule Place 18 mcg into inhaler and inhale daily.  08/16/13  Yes [provider]  traMADol (ULTRAM) 50 MG tablet Take 50 mg by mouth 3 (three) times daily as needed for severe pain.  08/16/13  Yes [provider]  vitamin B-12 (CYANOCOBALAMIN) 1000 MCG tablet Take 1,000 mcg by mouth daily as needed (tingling).   Yes [provider]    Physical Exam: Vitals:   08/12/17 1902 08/12/17 1903 08/12/17 1906 08/12/17 1919  BP:  (!) 116/59  (!) 116/59  Pulse: (!) 117 (!) 120 (!) 118 (!) 113  Resp: 17  13 (!) 23  Temp:    98.5 F (36.9 C)  TempSrc:    Oral  SpO2: 94% 93% 93% 98%  Weight:      Height:          Constitutional: Moderately built and nourished. Vitals:   08/12/17 1902 08/12/17 1903 08/12/17 1906 08/12/17  1919  BP:  (!) 116/59  (!) 116/59  Pulse: (!) 117 (!) 120 (!) 118 (!) 113  Resp: 17  13 (!) 23  Temp:    98.5 F (36.9 C)  TempSrc:    Oral  SpO2: 94% 93% 93% 98%  Weight:      Height:       Eyes: Anicteric no pallor. ENMT: No discharge from the ears eyes nose or mouth. Neck: No mass felt.  No neck rigidity. Respiratory: Bilateral coarse breathing sounds with no crepitations.  No wheezing. Cardiovascular: S1-S2 heard no murmurs appreciated. Abdomen: Soft nontender bowel sounds present. Musculoskeletal: No edema.  No joint effusion.  Right leg has dressing. Skin: No rash.  Skin appears warm. Neurologic: Alert awake oriented to time place and person.  Moves all extremities. Psychiatric: Appears normal.  Normal affect.   Labs on Admission: I have personally reviewed following labs and imaging studies  CBC: Recent Labs  Lab 08/12/17 1813  WBC 11.9*  NEUTROABS 9.0*  HGB 9.7*  HCT 29.3*  MCV 89.3  PLT 209   Basic Metabolic Panel: Recent Labs  Lab 08/12/17 1813  NA 132*  K 3.4*  CL 97*  CO2 23  GLUCOSE 191*  BUN 17  CREATININE 1.08  CALCIUM 8.5*   GFR: Estimated Creatinine Clearance: 48.4 mL/min (by C-G formula based on SCr of 1.08 mg/dL). Liver Function Tests: Recent Labs  Lab 08/12/17 1813  AST 26  ALT 17  ALKPHOS 53  BILITOT 0.4  PROT 6.4*  ALBUMIN 3.4*   No results for input(s): LIPASE, AMYLASE in the last 168 hours. No results for input(s): AMMONIA in the last 168 hours. Coagulation Profile: No results for input(s): INR, PROTIME in the last 168 hours. Cardiac Enzymes: No results for input(s): CKTOTAL, CKMB, CKMBINDEX, TROPONINI in the last 168 hours. BNP (last 3 results) No results for input(s): PROBNP in the last 8760 hours. HbA1C: No results for input(s): HGBA1C in the last 72 hours. CBG: No results for input(s): GLUCAP in the last 168 hours. Lipid Profile: No results for input(s): CHOL, HDL, LDLCALC, TRIG, CHOLHDL, LDLDIRECT in the last 72  hours. Thyroid Function Tests: No results for input(s): TSH, T4TOTAL, FREET4, T3FREE, THYROIDAB in the last 72 hours. Anemia Panel: No results for input(s): VITAMINB12, FOLATE, FERRITIN, TIBC, IRON, RETICCTPCT in the last 72 hours. Urine analysis:  Component Value Date/Time   COLORURINE YELLOW 08/12/2017 2103   APPEARANCEUR CLEAR 08/12/2017 2103   LABSPEC 1.010 08/12/2017 2103   PHURINE 5.0 08/12/2017 2103   GLUCOSEU NEGATIVE 08/12/2017 2103   HGBUR SMALL (A) 08/12/2017 2103   BILIRUBINUR NEGATIVE 08/12/2017 2103   KETONESUR NEGATIVE 08/12/2017 2103   PROTEINUR NEGATIVE 08/12/2017 2103   UROBILINOGEN 0.2 07/07/2015 1842   NITRITE NEGATIVE 08/12/2017 2103   LEUKOCYTESUR LARGE (A) 08/12/2017 2103   Sepsis Labs: @LABRCNTIP (procalcitonin:4,lacticidven:4) )No results found for this or any previous visit (from the past 240 hour(s)).   Radiological Exams on Admission: Dg Chest 2 View  Result Date: 08/12/2017 CLINICAL DATA:  One-week history of productive cough. Fever of 102 degrees Fahrenheit noted when the patient was at the wound care center earlier today getting treatment for a right foot ulcer. EXAM: CHEST  2 VIEW COMPARISON:  Screening CT chest 06/14/2016. Chest x-rays 07/08/2015 and earlier. FINDINGS: Prior sternotomy for CABG. Cardiac silhouette normal in size, unchanged. Thoracic aorta atherosclerotic, unchanged. Hilar and mediastinal contours otherwise unremarkable. Emphysematous changes throughout both lungs. Patchy opacities in the right middle lobe, more so than on the prior examinations, superimposed upon the scar and bronchiectasis identified on the CT. Lungs otherwise clear. No pleural effusions. Degenerative changes involving the thoracic spine. IMPRESSION: Right middle lobe bronchopneumonia is suspected, superimposed upon scar/bronchiectasis which was identified on the prior CT. Electronically Signed   By: Hulan Saas M.D.   On: 08/12/2017 19:01    EKG: Independently  reviewed.  Sinus tachycardia.  Assessment/Plan Active Problems:   Hx of CABG Feb 2010   Peripheral arterial disease (HCC)   Carotid artery disease (HCC)   Essential hypertension   Sepsis (HCC)   Diabetes mellitus type 2 in nonobese (HCC)   CAP (community acquired pneumonia)    1. Sepsis secondary pneumonia and possible UTI -patient has been placed on ceftriaxone and Zithromax.  Follow flu panel urine for Legionella strep antigen and cultures and continue to monitor lactic acid levels and pro calcitonin continue hydration. 2. CAD status post CABG -denies any chest pain.  Continue statins aspirin and Plavix. 3. Hypertension on Cozaar. 4. COPD patient is on nebulizer treatment. 5. Diabetes mellitus type 2 -we will continue Glucotrol hold metformin, I have also place patient on sliding scale coverage. 6. History of peripheral artery disease with right lower extremity wound -wound team has been consulted. 7. Normocytic normochromic anemia appears to be chronic.  Follow CBC.   DVT prophylaxis: Lovenox. Code Status: Full code. Family Communication: Discussed with patient. Disposition Plan: Home. Consults called: None. Admission status: Inpatient.   Eduard Clos MD [facility] Pager [phone].  If 7PM-7AM, please contact night-coverage [email or web address] Password [facility]  08/12/2017, 9:27 PM

## 2017-08-12 NOTE — ED Notes (Signed)
I gave I stat CG4  Critical result to PA Leaphart

## 2017-08-12 NOTE — ED Notes (Signed)
Pt attempting to provide a urine sample.

## 2017-08-12 NOTE — ED Notes (Signed)
Patient transported to X-ray 

## 2017-08-12 NOTE — ED Notes (Signed)
Assigned room 1417@2059  call report @2119 

## 2017-08-12 NOTE — ED Triage Notes (Signed)
Pt from home. Seen at wound care center for pressure ulcer right foot. Staff there noticed elevated HR and pt felt warm. Pt has productive cough. Yellow sputum x 1 week. Temp 102. Tylenol 1000 mg 5 pm. Denies N/V/D no chest pain. Fever for 2 days. Chills for 3 days. Cough x 1 week.

## 2017-08-12 NOTE — ED Notes (Signed)
Bed: [id number] Expected date:  Expected time:  Means of arrival:  Comments: Hold for hall d

## 2017-08-12 NOTE — ED Provider Notes (Signed)
Medical screening examination/treatment/procedure(s) were conducted as a shared visit with non-physician practitioner(s) and myself.  I personally evaluated the patient during the encounter.   EKG Interpretation None     66 year old male here with cough and sputum times 1 week.  Chest x-ray shows pneumonia.  Has elevated lactate.  Sepsis protocol started will be admitted to the hospitalist service   Lorre NickAllen, Erryn Dickison, MD 08/12/17 [phone]

## 2017-08-12 NOTE — ED Notes (Signed)
ED TO INPATIENT HANDOFF REPORT  Name/Age/Gender Reginald Ayers 66 y.o. male  Code Status    Code Status Orders  (From admission, onward)        Start     Ordered   08/12/17 2124  Full code  Continuous     08/12/17 2125    Code Status History    Date Active Date Inactive Code Status Order ID Comments User Context   12/08/2016 13:47 12/08/2016 21:17 Full Code [id number]  Serafina Mitchell, MD Inpatient   07/08/2015 05:51 07/09/2015 20:17 Full Code [id number]  Norval Morton, MD ED   12/06/2014 04:16 12/07/2014 16:44 Full Code [id number]  Theressa Millard, MD ED      Home/SNF/Other Home  Chief Complaint Fever; Cough; High Heart Rate  Level of Care/Admitting Diagnosis ED Disposition    ED Disposition Condition Comment   Admit  Hospital Area: [facility] [[id number]]  Level of Care: Telemetry [5]  Admit to tele based on following criteria: Monitor for Ischemic changes  Diagnosis: CAP (community acquired pneumonia) [[id number]]  Admitting Physician: Rise Patience [phone]  Attending Physician: Rise Patience [phone]  Estimated length of stay: past midnight tomorrow  Certification:: I certify this patient will need inpatient services for at least 2 midnights  PT Class (Do Not Modify): Inpatient [101]  PT Acc Code (Do Not Modify): Private [1]       Medical History Past Medical History:  Diagnosis Date  . Carotid artery disease (HCC)    L-ICA 100%, mod R-ICA dz  . COPD (chronic obstructive pulmonary disease) ([place])   . Coronary artery disease    s/p CABG February 2010 by Dr. Merilynn Finland  . Diabetes ([place])   . Emphysema lung ([place])   . Hyperlipidemia   . Hypertension   . Peripheral arterial disease ([place])    post left common iliac and right SFA stenting remotely  . Tobacco abuse         Allergies Allergies  Allergen Reactions  . Actos [Pioglitazone] Shortness Of Breath    Leg swelling   . Lisinopril Cough    IV  Location/Drains/Wounds Patient Lines/Drains/Airways Status   Active Line/Drains/Airways    Name:   Placement date:   Placement time:   Site:   Days:   Peripheral IV 08/12/17 Left Forearm   08/12/17    1814    Forearm   less than 1   Peripheral IV 08/12/17 Right Forearm   08/12/17    1819    Forearm   less than 1          Labs/Imaging Results for orders placed or performed during the hospital encounter of 08/12/17 (from the past 48 hour(s))  Comprehensive metabolic panel     Status: Abnormal   Collection Time: 08/12/17  6:13 PM  Result Value Ref Range   Sodium 132 (L) 135 - 145 mmol/L   Potassium 3.4 (L) 3.5 - 5.1 mmol/L   Chloride 97 (L) 101 - 111 mmol/L   CO2 23 22 - 32 mmol/L   Glucose, Bld 191 (H) 65 - 99 mg/dL   BUN 17 6 - 20 mg/dL   Creatinine, Ser 1.08 0.61 - 1.24 mg/dL   Calcium 8.5 (L) 8.9 - 10.3 mg/dL   Total Protein 6.4 (L) 6.5 - 8.1 g/dL   Albumin 3.4 (L) 3.5 - 5.0 g/dL   AST 26 15 - 41 U/L   ALT 17 17 - 63 U/L   Alkaline Phosphatase 53  38 - 126 U/L   Total Bilirubin 0.4 0.3 - 1.2 mg/dL   GFR calc non Af Amer >60 >60 mL/min   GFR calc Af Amer >60 >60 mL/min    Comment: (NOTE) The eGFR has been calculated using the CKD EPI equation. This calculation has not been validated in all clinical situations. eGFR's persistently <60 mL/min signify possible Chronic Kidney Disease.    Anion gap 12 5 - 15  CBC WITH DIFFERENTIAL     Status: Abnormal   Collection Time: 08/12/17  6:13 PM  Result Value Ref Range   WBC 11.9 (H) 4.0 - 10.5 K/uL   RBC 3.28 (L) 4.22 - 5.81 MIL/uL   Hemoglobin 9.7 (L) 13.0 - 17.0 g/dL   HCT 29.3 (L) 39.0 - 52.0 %   MCV 89.3 78.0 - 100.0 fL   MCH 29.6 26.0 - 34.0 pg   MCHC 33.1 30.0 - 36.0 g/dL   RDW 13.8 11.5 - 15.5 %   Platelets 209 150 - 400 K/uL   Neutrophils Relative % 76 %   Neutro Abs 9.0 (H) 1.7 - 7.7 K/uL   Lymphocytes Relative 11 %   Lymphs Abs 1.3 0.7 - 4.0 K/uL   Monocytes Relative 13 %   Monocytes Absolute 1.5 (H) 0.1 - 1.0  K/uL   Eosinophils Relative 0 %   Eosinophils Absolute 0.1 0.0 - 0.7 K/uL   Basophils Relative 0 %   Basophils Absolute 0.0 0.0 - 0.1 K/uL  I-Stat CG4 Lactic Acid, ED  (not at  [facility])     Status: Abnormal   Collection Time: 08/12/17  6:23 PM  Result Value Ref Range   Lactic Acid, Venous 2.51 (HH) 0.5 - 1.9 mmol/L   Comment NOTIFIED PHYSICIAN   Urinalysis, Routine w reflex microscopic     Status: Abnormal   Collection Time: 08/12/17  9:03 PM  Result Value Ref Range   Color, Urine YELLOW YELLOW   APPearance CLEAR CLEAR   Specific Gravity, Urine 1.010 1.005 - 1.030   pH 5.0 5.0 - 8.0   Glucose, UA NEGATIVE NEGATIVE mg/dL   Hgb urine dipstick SMALL (A) NEGATIVE   Bilirubin Urine NEGATIVE NEGATIVE   Ketones, ur NEGATIVE NEGATIVE mg/dL   Protein, ur NEGATIVE NEGATIVE mg/dL   Nitrite NEGATIVE NEGATIVE   Leukocytes, UA LARGE (A) NEGATIVE   RBC / HPF 0-5 0 - 5 RBC/hpf   WBC, UA 6-30 0 - 5 WBC/hpf   Bacteria, UA RARE (A) NONE SEEN   Squamous Epithelial / LPF NONE SEEN NONE SEEN   Mucus PRESENT    Amorphous Crystal PRESENT   I-Stat CG4 Lactic Acid, ED  (not at  [facility])     Status: Abnormal   Collection Time: 08/12/17  9:03 PM  Result Value Ref Range   Lactic Acid, Venous 2.11 (HH) 0.5 - 1.9 mmol/L   Dg Chest 2 View  Result Date: 08/12/2017 CLINICAL DATA:  One-week history of productive cough. Fever of 102 degrees Fahrenheit noted when the patient was at the wound care center earlier today getting treatment for a right foot ulcer. EXAM: CHEST  2 VIEW COMPARISON:  Screening CT chest 06/14/2016. Chest x-rays 07/08/2015 and earlier. FINDINGS: Prior sternotomy for CABG. Cardiac silhouette normal in size, unchanged. Thoracic aorta atherosclerotic, unchanged. Hilar and mediastinal contours otherwise unremarkable. Emphysematous changes throughout both lungs. Patchy opacities in the right middle lobe, more so than on the prior examinations, superimposed upon the scar and bronchiectasis identified on  the CT. Lungs otherwise clear.  No pleural effusions. Degenerative changes involving the thoracic spine. IMPRESSION: Right middle lobe bronchopneumonia is suspected, superimposed upon scar/bronchiectasis which was identified on the prior CT. Electronically Signed   By: Evangeline Dakin M.D.   On: 08/12/2017 19:01    Pending Labs Unresulted Labs (From admission, onward)   Start     Ordered   08/19/17 0500  Creatinine, serum  (enoxaparin (LOVENOX)    CrCl >/= 30 ml/min)  Weekly,   R    Comments:  while on enoxaparin therapy    08/12/17 2125   08/13/17 3383  Basic metabolic panel  Tomorrow morning,   R     08/12/17 2125   08/13/17 0500  CBC  Tomorrow morning,   R     08/12/17 2125   08/12/17 2127  Lactic acid, plasma  STAT Now then every 3 hours,   R     08/12/17 2126   08/12/17 2123  Culture, sputum-assessment  Once,   R     08/12/17 2125   08/12/17 2123  Gram stain  Once,   R     08/12/17 2125   08/12/17 2123  Strep pneumoniae urinary antigen  Once,   R     08/12/17 2125   08/12/17 2123  Legionella Pneumophila Serogp 1 Ur Ag  Once,   R     08/12/17 2125   08/12/17 2123  CBC  (enoxaparin (LOVENOX)    CrCl >/= 30 ml/min)  Once,   R    Comments:  Baseline for enoxaparin therapy IF NOT ALREADY DRAWN.  Notify MD if PLT < 100 K.    08/12/17 2125   08/12/17 2123  Creatinine, serum  (enoxaparin (LOVENOX)    CrCl >/= 30 ml/min)  Once,   R    Comments:  Baseline for enoxaparin therapy IF NOT ALREADY DRAWN.    08/12/17 2125   08/12/17 1916  Influenza panel by PCR (type A & B)  (Influenza PCR Panel)  Once,   R     08/12/17 1915   08/12/17 1811  Blood Culture (routine x 2)  BLOOD CULTURE X 2,   STAT     08/12/17 1813   08/12/17 1811  Urine culture  STAT,   STAT     08/12/17 1813      Vitals/Pain Today's Vitals   08/12/17 1903 08/12/17 1906 08/12/17 1919 08/12/17 2100  BP: (!) 116/59  (!) 116/59 121/62  Pulse: (!) 120 (!) 118 (!) 113 (!) 108  Resp:  13 (!) 23 16  Temp:   98.5 F (36.9  C)   TempSrc:   Oral   SpO2: 93% 93% 98% 99%  Weight:      Height:        Isolation Precautions No active isolations  Medications Medications  albuterol (PROVENTIL) (2.5 MG/3ML) 0.083% nebulizer solution 3 mL (not administered)  aspirin EC tablet 650 mg (not administered)  clopidogrel (PLAVIX) tablet 75 mg (not administered)  glipiZIDE (GLUCOTROL XL) 24 hr tablet 10 mg (not administered)  irbesartan (AVAPRO) tablet 300 mg (not administered)  simvastatin (ZOCOR) tablet 40 mg (not administered)  tiotropium (SPIRIVA) inhalation capsule 18 mcg (not administered)  traMADol (ULTRAM) tablet 50 mg (not administered)  vitamin B-12 (CYANOCOBALAMIN) tablet 1,000 mcg (not administered)  acetaminophen (TYLENOL) tablet 650 mg (not administered)    Or  acetaminophen (TYLENOL) suppository 650 mg (not administered)  ondansetron (ZOFRAN) tablet 4 mg (not administered)    Or  ondansetron (ZOFRAN) injection 4 mg (not administered)  enoxaparin (LOVENOX) injection 40 mg (not administered)  0.9 %  sodium chloride infusion (not administered)  cefTRIAXone (ROCEPHIN) 1 g in dextrose 5 % 50 mL IVPB (not administered)  azithromycin (ZITHROMAX) 500 mg in dextrose 5 % 250 mL IVPB (not administered)  insulin aspart (novoLOG) injection 0-9 Units (not administered)  sodium chloride 0.9 % bolus 1,000 mL (0 mLs Intravenous Stopped 08/12/17 1855)    And  sodium chloride 0.9 % bolus 500 mL (0 mLs Intravenous Stopped 08/12/17 1855)    And  sodium chloride 0.9 % bolus 250 mL (250 mLs Intravenous New Bag/Given 08/12/17 1856)  piperacillin-tazobactam (ZOSYN) IVPB 3.375 g (0 g Intravenous Stopped 08/12/17 1911)  vancomycin (VANCOCIN) IVPB 1000 mg/200 mL premix (0 mg Intravenous Stopped 08/12/17 1944)    Mobility walks

## 2017-08-12 NOTE — ED Provider Notes (Signed)
[facility]-EMERGENCY DEPT Provider Note   CSN: [id number] Arrival date & time: 08/12/17  1737     History   Chief Complaint Chief Complaint  Patient presents with  . Fever  . Cough  . Wound Infection    HPI Reginald Ayers is a 66 y.o. male.  HPI 66 year old Caucasian male past medical history significant for CAD status post PCI, COPD, diabetes, hypertension that presents to the emergency department today for evaluation of fever, cough.  Patient has pressure ulcer to his right foot and right leg that is followed by home health and wound clinic.  States the home health nurse saw patient today and states that the wound looked okay but patient felt warm and was tachycardic.  Sent patient to the ED for evaluation.  Patient does report a one-week history of significant sputum production with a cough.  Also reports wheezing.  Reports fever and chills intermittently.  Patient not taking for his symptoms.  Patient with history of COPD but does not smoke at this time.  Patient denies any other associated symptoms including urinary symptoms, change in bowel habits, chest pain, shortness of breath, abdominal pain, nausea, emesis.  EMS note the patient had a fever of 102 in route and was given 1000 mg of Tylenol. Past Medical History:  Diagnosis Date  . Carotid artery disease (HCC)    L-ICA 100%, mod R-ICA dz  . COPD (chronic obstructive pulmonary disease) (HCC)   . Coronary artery disease    s/p CABG February 2010 by Dr. Andrey Spearman  . Diabetes (HCC)   . Emphysema lung (HCC)   . Hyperlipidemia   . Hypertension   . Peripheral arterial disease (HCC)    post left common iliac and right SFA stenting remotely  . Tobacco abuse         Patient Active Problem List   Diagnosis Date Noted  . Critical lower limb ischemia 04/08/2016  . GERD (gastroesophageal reflux disease) 07/30/2015  . Chest pain 12/06/2014  . Tobacco abuse 12/06/2014  . Hyponatremia 12/06/2014  .  COPD exacerbation (HCC)   . Pain in the chest   . Hx of CABG Feb 2010 08/21/2013  . Peripheral arterial disease (HCC) 08/21/2013  . Carotid artery disease (HCC) 08/21/2013  . Diabetes (HCC) 08/21/2013  . Essential hypertension 08/21/2013  . Hyperlipidemia 08/21/2013    Past Surgical History:  Procedure Laterality Date  . ABDOMINAL AORTOGRAM N/A 04/20/2017   Procedure: ABDOMINAL AORTOGRAM;  Surgeon: Nada Libman, MD;  Location: [facility] INVASIVE CV LAB;  Service: Cardiovascular;  Laterality: N/A;  . ABDOMINAL AORTOGRAM W/LOWER EXTREMITY Right 12/08/2016   Procedure: Abdominal Aortogram w/Lower Extremity;  Surgeon: Nada Libman, MD;  Location: [facility] INVASIVE CV LAB;  Service: Cardiovascular;  Laterality: Right;  . CARDIAC CATHETERIZATION  2010  . CAROTID ANGIOGRAM  2014  . CORONARY ARTERY BYPASS GRAFT  2010   LIMA-LAD, Lrad-OM1, SVG-RI, SVG-AM-dRCA  . DOPPLER ECHOCARDIOGRAPHY  2010  . LOWER EXTREMITY ANGIOGRAM  11/2006   left common femoral endarterectomy and patch angioplasty:The mid right SFA was angioplastied with a 4 x 8  . LOWER EXTREMITY ANGIOGRAM  2003   left common iliac artery stenting by Dr. Erlene Quan  . LOWER EXTREMITY ANGIOGRAPHY Right 04/20/2017   Procedure: Lower Extremity Angiography;  Surgeon: Nada Libman, MD;  Location: [facility] INVASIVE CV LAB;  Service: Cardiovascular;  Laterality: Right;  . NM MYOVIEW LTD  2015  . PERIPHERAL VASCULAR ATHERECTOMY Right 12/08/2016   Procedure: Peripheral  Vascular Atherectomy;  Surgeon: Nada Libman, MD;  Location: [facility] INVASIVE CV LAB;  Service: Cardiovascular;  Laterality: Right;  . PERIPHERAL VASCULAR BALLOON ANGIOPLASTY Right 12/08/2016   Procedure: Peripheral Vascular Balloon Angioplasty;  Surgeon: Nada Libman, MD;  Location: [facility] INVASIVE CV LAB;  Service: Cardiovascular;  Laterality: Right;  . PERIPHERAL VASCULAR BALLOON ANGIOPLASTY Right 04/20/2017   Procedure: PERIPHERAL VASCULAR BALLOON ANGIOPLASTY;  Surgeon: Nada Libman, MD;   Location: [facility] INVASIVE CV LAB;  Service: Cardiovascular;  Laterality: Right;  SFA  . PERIPHERAL VASCULAR CATHETERIZATION N/A 09/22/2016   Procedure: Abdominal Aortogram w/ bilateral Lower Extremity Runoff;  Surgeon: Nada Libman, MD;  Location: [facility] INVASIVE CV LAB;  Service: Cardiovascular;  Laterality: N/A;  . PERIPHERAL VASCULAR CATHETERIZATION Right 09/22/2016   Procedure: Peripheral Vascular Intervention;  Surgeon: Nada Libman, MD;  Location: [facility] INVASIVE CV LAB;  Service: Cardiovascular;  Laterality: Right;       Home Medications    Prior to Admission medications   Medication Sig Start Date End Date Taking? Authorizing Provider  albuterol (PROVENTIL HFA;VENTOLIN HFA) 108 (90 Base) MCG/ACT inhaler Inhale 2 puffs into the lungs every 4 (four) hours as needed for wheezing or shortness of breath.    Yes [provider]  albuterol (PROVENTIL) (2.5 MG/3ML) 0.083% nebulizer solution Inhale 3 mLs into the lungs every 3 (three) hours as needed for shortness of breath.  06/24/16  Yes [provider]  aspirin EC 325 MG tablet Take 650 mg by mouth daily as needed for mild pain.    Yes [provider]  B Complex Vitamins (B COMPLEX PO) Take 1 tablet by mouth daily.   Yes [provider]  Benzocaine (BOIL-EASE EX) Apply 1 application topically daily as needed (BOIL).   Yes [provider]  clopidogrel (PLAVIX) 75 MG tablet Take 75 mg by mouth daily.  07/24/13  Yes [provider]  Cyanocobalamin (VITAMIN B-12 IJ) Inject 1 Dose as directed every 30 (thirty) days.    Yes [provider]  furosemide (LASIX) 40 MG tablet Take 40 mg by mouth daily as needed for edema.   Yes [provider]  glipiZIDE (GLUCOTROL XL) 10 MG 24 hr tablet Take 10 mg by mouth 2 (two) times daily.  07/24/13  Yes [provider]  Homeopathic Products (LEG CRAMP RELIEF SL) Place 2 tablets under the tongue at bedtime as needed (leg cramps).    Yes  [provider]  ibuprofen (ADVIL,MOTRIN) 200 MG tablet Take 800 mg by mouth every 8 (eight) hours as needed for moderate pain.   Yes [provider]  irbesartan (AVAPRO) 300 MG tablet Take 300 mg by mouth daily.  07/24/13  Yes [provider]  IRON PO Take 1 tablet by mouth daily.   Yes [provider]  metFORMIN (GLUCOPHAGE) 500 MG tablet Take 1,000 mg by mouth 2 (two) times daily with a meal.  07/24/13  Yes [provider]  Multiple Vitamin (MULTIVITAMIN WITH MINERALS) TABS tablet Take 1 tablet by mouth daily.   Yes [provider]  naproxen sodium (ANAPROX) 220 MG tablet Take 660 mg by mouth daily as needed (pain).   Yes [provider]  NITROSTAT 0.4 MG SL tablet Take 0.4 mg by mouth every 5 (five) minutes as needed for chest pain.  12/18/14  Yes [provider]  Polyvinyl Alcohol-Povidone (MURINE TEARS FOR DRY EYES OP) Apply 1 drop to eye 3 (three) times daily as needed (dry eyes).  Yes [provider]  Pseudoeph-Doxylamine-DM-APAP (NYQUIL PO) Take 1 Dose by mouth at bedtime.    Yes [provider]  simvastatin (ZOCOR) 40 MG tablet Take 40 mg by mouth every evening. 06/13/16  Yes [provider]  SPIRIVA HANDIHALER 18 MCG inhalation capsule Place 18 mcg into inhaler and inhale daily.  08/16/13  Yes [provider]  traMADol (ULTRAM) 50 MG tablet Take 50 mg by mouth 3 (three) times daily as needed for severe pain.  08/16/13  Yes [provider]  vitamin B-12 (CYANOCOBALAMIN) 1000 MCG tablet Take 1,000 mcg by mouth daily as needed (tingling).   Yes [provider]    Family History Family History  Problem Relation Age of Onset  . Heart attack Mother   . CVA Mother   . Heart disease Mother   . Diabetes Sister   . Hypertension Sister     Social History Social History   Tobacco Use  . Smoking status: Light Tobacco Smoker    Packs/day: 0.25    Types: Cigarettes      Last attempt to quit: 08/07/2016    Years since quitting: 1.0  . Smokeless tobacco: Current User    Types: Chew  Substance Use Topics  . Alcohol use: No    Alcohol/week: 0.0 oz    Comment: Weekend drinker, quit 2000.   . Drug use: No     Allergies   Actos [pioglitazone] and Lisinopril   Review of Systems Review of Systems  Constitutional: Negative for chills and fever.  HENT: Negative for congestion and sore throat.   Eyes: Negative for visual disturbance.  Respiratory: Positive for cough and wheezing. Negative for shortness of breath.   Cardiovascular: Negative for chest pain.  Gastrointestinal: Negative for abdominal pain, diarrhea, nausea and vomiting.  Genitourinary: Negative for dysuria, flank pain, frequency, hematuria and urgency.  Musculoskeletal: Negative for arthralgias and myalgias.  Skin: Negative for rash.  Neurological: Negative for dizziness, syncope, weakness, light-headedness, numbness and headaches.  Psychiatric/Behavioral: Negative for sleep disturbance. The patient is not nervous/anxious.      Physical Exam Updated Vital Signs BP (!) 116/59   Pulse (!) 113   Temp 98.5 F (36.9 C) (Oral)   Resp (!) 23   Ht 5\' 1"  (1.549 m)   Wt 59 kg (130 lb)   SpO2 98%   BMI 24.56 kg/m   Physical Exam  Constitutional: He is oriented to person, place, and time. He appears well-developed and well-nourished.  Non-toxic appearance. No distress.  HENT:  Head: Normocephalic and atraumatic.  Mouth/Throat: Oropharynx is clear and moist.  Eyes: Conjunctivae are normal. Pupils are equal, round, and reactive to light. Right eye exhibits no discharge. Left eye exhibits no discharge.  Neck: Normal range of motion. Neck supple.  Cardiovascular: Regular rhythm, normal heart sounds and intact distal pulses. Exam reveals no gallop and no friction rub.  No murmur heard. Tachycardia noted.  Pulmonary/Chest: Effort normal. No accessory muscle usage or stridor. Tachypnea  noted. No respiratory distress. He has no decreased breath sounds. He has wheezes. He has rhonchi. He has rales. He exhibits no tenderness.  Audible wheezing and rhonchi noted.  Patient is mildly hypoxic at 93%.  Tachypnea noted.  Abdominal: Soft. Bowel sounds are normal. He exhibits no distension. There is no tenderness. There is no rebound and no guarding.  Musculoskeletal: Normal range of motion. He exhibits no tenderness.  Pulses are equal in all extremities bilaterally.  Warm to touch.  Sensation intact brisk cap  refill.  Lymphadenopathy:    He has no cervical adenopathy.  Neurological: He is alert and oriented to person, place, and time.  Skin: Skin is warm and dry. Capillary refill takes less than 2 seconds. No rash noted.  Patient has a wound to the right lower leg that is wrapped by wound care today.  Did not unwrap this.   Psychiatric: His behavior is normal. Judgment and thought content normal.  Nursing note and vitals reviewed.    ED Treatments / Results  Labs (all labs ordered are listed, but only abnormal results are displayed) Labs Reviewed  COMPREHENSIVE METABOLIC PANEL - Abnormal; Notable for the following components:      Result Value   Sodium 132 (*)    Potassium 3.4 (*)    Chloride 97 (*)    Glucose, Bld 191 (*)    Calcium 8.5 (*)    Total Protein 6.4 (*)    Albumin 3.4 (*)    All other components within normal limits  CBC WITH DIFFERENTIAL/PLATELET - Abnormal; Notable for the following components:   WBC 11.9 (*)    RBC 3.28 (*)    Hemoglobin 9.7 (*)    HCT 29.3 (*)    Neutro Abs 9.0 (*)    Monocytes Absolute 1.5 (*)    All other components within normal limits  I-STAT CG4 LACTIC ACID, ED - Abnormal; Notable for the following components:   Lactic Acid, Venous 2.51 (*)    All other components within normal limits  CULTURE, BLOOD (ROUTINE X 2)  CULTURE, BLOOD (ROUTINE X 2)  URINE CULTURE  URINALYSIS, ROUTINE W REFLEX MICROSCOPIC  INFLUENZA PANEL BY PCR  (TYPE A & B)  I-STAT CG4 LACTIC ACID, ED    EKG  EKG Interpretation None       Radiology Dg Chest 2 View  Result Date: 08/12/2017 CLINICAL DATA:  One-week history of productive cough. Fever of 102 degrees Fahrenheit noted when the patient was at the wound care center earlier today getting treatment for a right foot ulcer. EXAM: CHEST  2 VIEW COMPARISON:  Screening CT chest 06/14/2016. Chest x-rays 07/08/2015 and earlier. FINDINGS: Prior sternotomy for CABG. Cardiac silhouette normal in size, unchanged. Thoracic aorta atherosclerotic, unchanged. Hilar and mediastinal contours otherwise unremarkable. Emphysematous changes throughout both lungs. Patchy opacities in the right middle lobe, more so than on the prior examinations, superimposed upon the scar and bronchiectasis identified on the CT. Lungs otherwise clear. No pleural effusions. Degenerative changes involving the thoracic spine. IMPRESSION: Right middle lobe bronchopneumonia is suspected, superimposed upon scar/bronchiectasis which was identified on the prior CT. Electronically Signed   By: Hulan Saashomas  Lawrence M.D.   On: 08/12/2017 19:01    Procedures Procedures (including critical care time)  Medications Ordered in ED Medications  sodium chloride 0.9 % bolus 1,000 mL (0 mLs Intravenous Stopped 08/12/17 1855)    And  sodium chloride 0.9 % bolus 500 mL (0 mLs Intravenous Stopped 08/12/17 1855)    And  sodium chloride 0.9 % bolus 250 mL (250 mLs Intravenous New Bag/Given 08/12/17 1856)  piperacillin-tazobactam (ZOSYN) IVPB 3.375 g (3.375 g Intravenous New Bag/Given 08/12/17 1841)  vancomycin (VANCOCIN) IVPB 1000 mg/200 mL premix (1,000 mg Intravenous New Bag/Given 08/12/17 1844)     Initial Impression / Assessment and Plan / ED Course  I have reviewed the triage vital signs and the nursing notes.  Pertinent labs & imaging results that were available during my care of the patient were reviewed by me and considered in  my medical  decision making (see chart for details).     Patient presents to the ED for evaluation of 1 week productive cough with fever and chills.  Noted by home health today to be febrile and tachycardic.  He does have wounds to his right lower leg that was wrapped by nursing staff today that states that not look infected.  Patient noted to have audible wheezing and rhonchi.  He is tachycardic.  Low-grade fever in the ED however given Tylenol 30 minutes ago by EMS.  He is tachypneic.  Also has a saturation of 93% on room air.  Patient on 2 L and satting at 98%.  Lungs with expiratory wheezes and rhonchi noted bilaterally.  Abdominal exam is benign without any tenderness.  Tachycardia with regular rhythm no rubs murmurs or gallops noted.  Neurovascular intact in all extremities.  Lab work reveals a leukocytosis of 11,000.  Lactic acid is 2.5.  Hemoglobin appears at patient's baseline.  Mild hyponatremia and hypokalemia noted.  Liver enzymes and kidney function is normal.  Chest x-ray reveals right middle lobe bronchopneumonia.  EKG shows sinus tachycardia without any other abnormalities noted.  Code sepsis was initiated given vital signs and lab work.  30 cc/kg fluid bolus was given.  However patient is not hypotensive and has does not have a lactic before.  Broad-spectrum antibiotics initiated.  Blood cultures were obtained prior to antibiotic initiation.  Urine and urine culture are pending at this time.  Discussed with hospital medicine who agrees to admission Dr. Toniann FailKakrakandy and will see patient in the ED and place admission orders.  Patient remains hemodynamically stable this time.  Heart rate has improved.  Patient remains afebrile.  Satting at 90% on room air.  Patient seen by my attending Dr. Freida BusmanAllen who is agreed with the above plan.  Final Clinical Impressions(s) / ED Diagnoses   Final diagnoses:  Sepsis, due to unspecified organism [facility](HCC)  Community acquired pneumonia of right middle lobe of lung  [facility](HCC)    ED Discharge Orders    None       Wallace KellerLeaphart, Edgar Reisz T, PA-C 08/12/17 2004

## 2017-08-13 LAB — BASIC METABOLIC PANEL
ANION GAP: 9 (ref 5–15)
BUN: 15 mg/dL (ref 6–20)
CALCIUM: 7.4 mg/dL — AB (ref 8.9–10.3)
CO2: 23 mmol/L (ref 22–32)
Chloride: 101 mmol/L (ref 101–111)
Creatinine, Ser: 1.04 mg/dL (ref 0.61–1.24)
GFR calc Af Amer: >60 mL/min (ref >60)
GFR calc non Af Amer: >60 mL/min (ref >60)
GLUCOSE: 262 mg/dL — AB (ref 65–99)
Potassium: 2.8 mmol/L — ABNORMAL LOW (ref 3.5–5.1)
Sodium: 133 mmol/L — ABNORMAL LOW (ref 135–145)

## 2017-08-13 LAB — GLUCOSE, CAPILLARY
GLUCOSE-CAPILLARY: 108 mg/dL — AB (ref 65–99)
GLUCOSE-CAPILLARY: 144 mg/dL — AB (ref 65–99)
GLUCOSE-CAPILLARY: 167 mg/dL — AB (ref 65–99)
Glucose-Capillary: 169 mg/dL — ABNORMAL HIGH (ref 65–99)

## 2017-08-13 LAB — MAGNESIUM: MAGNESIUM: 1.4 mg/dL — AB (ref 1.7–2.4)

## 2017-08-13 LAB — CBC
HCT: 25.2 % — ABNORMAL LOW (ref 39.0–52.0)
HEMOGLOBIN: 8.5 g/dL — AB (ref 13.0–17.0)
MCH: 30.5 pg (ref 26.0–34.0)
MCHC: 33.7 g/dL (ref 30.0–36.0)
MCV: 90.3 fL (ref 78.0–100.0)
Platelets: 172 10*3/uL (ref 150–400)
RBC: 2.79 MIL/uL — ABNORMAL LOW (ref 4.22–5.81)
RDW: 14 % (ref 11.5–15.5)
WBC: 10.4 10*3/uL (ref 4.0–10.5)

## 2017-08-13 LAB — TROPONIN I: Troponin I: <0.03 ng/mL (ref <0.03)

## 2017-08-13 LAB — EXPECTORATED SPUTUM ASSESSMENT W REFEX TO RESP CULTURE

## 2017-08-13 LAB — EXPECTORATED SPUTUM ASSESSMENT W GRAM STAIN, RFLX TO RESP C

## 2017-08-13 LAB — LACTIC ACID, PLASMA: LACTIC ACID, VENOUS: 1.3 mmol/L (ref 0.5–1.9)

## 2017-08-13 LAB — STREP PNEUMONIAE URINARY ANTIGEN: Strep Pneumo Urinary Antigen: NEGATIVE

## 2017-08-13 MED ORDER — GUAIFENESIN 100 MG/5ML PO SOLN
15.0000 mL | Freq: Once | ORAL | Status: AC
  Administered 2017-08-13: 300 mg via ORAL
  Filled 2017-08-13: qty 20

## 2017-08-13 MED ORDER — GUAIFENESIN ER 600 MG PO TB12
600.0000 mg | ORAL_TABLET | Freq: Two times a day (BID) | ORAL | Status: DC
  Administered 2017-08-13 – 2017-08-15 (×5): 600 mg via ORAL
  Filled 2017-08-13 (×5): qty 1

## 2017-08-13 MED ORDER — POTASSIUM CHLORIDE CRYS ER 20 MEQ PO TBCR
40.0000 meq | EXTENDED_RELEASE_TABLET | Freq: Once | ORAL | Status: AC
  Administered 2017-08-13: 40 meq via ORAL
  Filled 2017-08-13: qty 2

## 2017-08-13 MED ORDER — POTASSIUM CHLORIDE CRYS ER 20 MEQ PO TBCR
80.0000 meq | EXTENDED_RELEASE_TABLET | Freq: Once | ORAL | Status: AC
  Administered 2017-08-13: 80 meq via ORAL
  Filled 2017-08-13: qty 4

## 2017-08-13 MED ORDER — MAGNESIUM SULFATE 2 GM/50ML IV SOLN
2.0000 g | Freq: Once | INTRAVENOUS | Status: AC
  Administered 2017-08-13: 2 g via INTRAVENOUS
  Filled 2017-08-13: qty 50

## 2017-08-13 MED ORDER — SODIUM CHLORIDE 0.9 % IV SOLN: INTRAVENOUS | Status: AC

## 2017-08-13 NOTE — Progress Notes (Signed)
PROGRESS NOTE    Reginald GalaWilliam H Sobotka  WGN:[id number] DOB: [birth year] DOA: 08/12/2017 PCP: Renford DillsPolite, Ronald, MD  Brief Narrative:Reginald Ayers is a 66 y.o. male with history of CAD status post CABG, COPD, peripheral arterial disease with chronic right lower extremity wound, diabetes mellitus type 2, hypertension was advised to come to the ER by patient's home health aide after patient was found to have fever chills with productive cough.  ER patient was tachycardic labs showed elevated lactic levels leukocytosis and chest x-ray showed bronchopneumonia with abnormal UA.  Assessment & Plan:   Sepsis secondary pneumonia -Clinically doubt urinary infection at this time suspect primarily pulmonary source with increased productive cough -Continue ceftriaxone and azithromycin -Cut down IV fluids -Lactic acidosis has resolved -Follow up blood and sputum cultures  CAD status post CABG -denies any chest pain.   -Continue statins and Plavix.  Hypokalemia - replace, mag level also low will give 2 g of IV magnesium today  Hypertension  -Stable, continue  Cozaar.  COPD -Stable, no wheezing noted, continue nebs PRN  Diabetes mellitus type 2  -Hold metformin, sliding-scale insulin -CBGs are stable  Peripheral vascular disease with chronic right leg wound -Followed by the wound center, and has a home health nurse -[facility]  DVT prophylaxis: Lovenox. Code Status: Full code. Family Communication: Discussed with patient. Disposition Plan: Home    Consultants:      Procedures:   Antimicrobials:  Anti-infectives (From admission, onward)   Start     Dose/Rate Route Frequency Ordered Stop   08/12/17 2300  azithromycin (ZITHROMAX) 500 mg in dextrose 5 % 250 mL IVPB     500 mg 250 mL/hr over 60 Minutes Intravenous Every 24 hours 08/12/17 2125 08/19/17 2259   08/12/17 2200  cefTRIAXone (ROCEPHIN) 1 g in dextrose 5 % 50 mL IVPB     1 g 100 mL/hr over 30 Minutes Intravenous  Every 24 hours 08/12/17 2125 08/19/17 2159   08/12/17 1830  piperacillin-tazobactam (ZOSYN) IVPB 3.375 g     3.375 g 100 mL/hr over 30 Minutes Intravenous  Once 08/12/17 1829 08/12/17 1911   08/12/17 1830  vancomycin (VANCOCIN) IVPB 1000 mg/200 mL premix     1,000 mg 200 mL/hr over 60 Minutes Intravenous  Once 08/12/17 1829 08/12/17 1944      Subjective: -coughing a lot, thick productive sputum  Objective: Vitals:   08/12/17 2100 08/12/17 2222 08/12/17 2314 08/13/17 0700  BP: 121/62 123/75  (!) 130/54  Pulse: (!) 108 (!) 103  98  Resp: 16 18  19   Temp:  98.1 F (36.7 C)  98.2 F (36.8 C)  TempSrc:  Oral  Oral  SpO2: 99% 100% 98% 100%  Weight:      Height:        Intake/Output Summary (Last 24 hours) at 08/13/2017 1100 Last data filed at 08/13/2017 0730 Gross per 24 hour  Intake 2695.83 ml  Output 1400 ml  Net 1295.83 ml   Filed Weights   08/12/17 1828  Weight: 59 kg (130 lb)    Examination:  General exam: Appears calm and comfortable  Respiratory system: Scattered rhonchi increased in the right CVS: S1S2/RRR Gastrointestinal system: Abdomen is nondistended, soft and nontender.Normal bowel sounds heard. Central nervous system: Alert and oriented. No focal neurological deficits. Extremities: R leg with UNNA dressing, compressive dressing Skin: No rashes, lesions or ulcers Psychiatry: Judgement and insight appear normal. Mood & affect appropriate.     Data Reviewed:   CBC: Recent Labs  Lab 08/12/17 1813 08/13/17 0015  WBC 11.9* 10.4  NEUTROABS 9.0*  --   HGB 9.7* 8.5*  HCT 29.3* 25.2*  MCV 89.3 90.3  PLT 209 172   Basic Metabolic Panel: Recent Labs  Lab 08/12/17 1813 08/13/17 0015 08/13/17 0734  NA 132* 133*  --   K 3.4* 2.8*  --   CL 97* 101  --   CO2 23 23  --   GLUCOSE 191* 262*  --   BUN 17 15  --   CREATININE 1.08 1.04  --   CALCIUM 8.5* 7.4*  --   MG  --   --  1.4*   GFR: Estimated Creatinine Clearance: 50.3 mL/min (by C-G formula  based on SCr of 1.04 mg/dL). Liver Function Tests: Recent Labs  Lab 08/12/17 1813  AST 26  ALT 17  ALKPHOS 53  BILITOT 0.4  PROT 6.4*  ALBUMIN 3.4*   No results for input(s): LIPASE, AMYLASE in the last 168 hours. No results for input(s): AMMONIA in the last 168 hours. Coagulation Profile: No results for input(s): INR, PROTIME in the last 168 hours. Cardiac Enzymes: Recent Labs  Lab 08/13/17 0734  TROPONINI <0.03   BNP (last 3 results) No results for input(s): PROBNP in the last 8760 hours. HbA1C: No results for input(s): HGBA1C in the last 72 hours. CBG: Recent Labs  Lab 08/12/17 2330 08/13/17 0733  GLUCAP 145* 108*   Lipid Profile: No results for input(s): CHOL, HDL, LDLCALC, TRIG, CHOLHDL, LDLDIRECT in the last 72 hours. Thyroid Function Tests: No results for input(s): TSH, T4TOTAL, FREET4, T3FREE, THYROIDAB in the last 72 hours. Anemia Panel: No results for input(s): VITAMINB12, FOLATE, FERRITIN, TIBC, IRON, RETICCTPCT in the last 72 hours. Urine analysis:    Component Value Date/Time   COLORURINE YELLOW 08/12/2017 2103   APPEARANCEUR CLEAR 08/12/2017 2103   LABSPEC 1.010 08/12/2017 2103   PHURINE 5.0 08/12/2017 2103   GLUCOSEU NEGATIVE 08/12/2017 2103   HGBUR SMALL (A) 08/12/2017 2103   BILIRUBINUR NEGATIVE 08/12/2017 2103   KETONESUR NEGATIVE 08/12/2017 2103   PROTEINUR NEGATIVE 08/12/2017 2103   UROBILINOGEN 0.2 07/07/2015 1842   NITRITE NEGATIVE 08/12/2017 2103   LEUKOCYTESUR LARGE (A) 08/12/2017 2103   Sepsis Labs: @LABRCNTIP (procalcitonin:4,lacticidven:4)  )No results found for this or any previous visit (from the past 240 hour(s)).       Radiology Studies: Dg Chest 2 View  Result Date: 08/12/2017 CLINICAL DATA:  One-week history of productive cough. Fever of 102 degrees Fahrenheit noted when the patient was at the wound care center earlier today getting treatment for a right foot ulcer. EXAM: CHEST  2 VIEW COMPARISON:  Screening CT chest  06/14/2016. Chest x-rays 07/08/2015 and earlier. FINDINGS: Prior sternotomy for CABG. Cardiac silhouette normal in size, unchanged. Thoracic aorta atherosclerotic, unchanged. Hilar and mediastinal contours otherwise unremarkable. Emphysematous changes throughout both lungs. Patchy opacities in the right middle lobe, more so than on the prior examinations, superimposed upon the scar and bronchiectasis identified on the CT. Lungs otherwise clear. No pleural effusions. Degenerative changes involving the thoracic spine. IMPRESSION: Right middle lobe bronchopneumonia is suspected, superimposed upon scar/bronchiectasis which was identified on the prior CT. Electronically Signed   By: Hulan Saas M.D.   On: 08/12/2017 19:01        Scheduled Meds: . clopidogrel  75 mg Oral Daily  . enoxaparin (LOVENOX) injection  40 mg Subcutaneous QHS  . glipiZIDE  10 mg Oral BID WC  . guaiFENesin  600 mg Oral BID  .  insulin aspart  0-9 Units Subcutaneous TID WC  . irbesartan  300 mg Oral Daily  . potassium chloride  40 mEq Oral Once  . simvastatin  40 mg Oral QPM  . tiotropium  18 mcg Inhalation Daily   Continuous Infusions: . sodium chloride 75 mL/hr at 08/13/17 0748  . azithromycin Stopped (08/13/17 0012)  . cefTRIAXone (ROCEPHIN)  IV Stopped (08/13/17 0017)  . magnesium sulfate 1 - 4 g bolus IVPB       LOS: 1 day    Time spent: 35min    Zannie CovePreetha Compton Brigance, MD [facility] Page via [email or web address], password [email or web address] After 7PM please contact night-coverage  08/13/2017, 11:00 AM

## 2017-08-13 NOTE — Consult Note (Signed)
WOC Nurse wound consult note Reason for Consult: right lower leg full thickness wounds, dorsal right foot, bilateral buttock partial thickness Wound type: arterial insufficiency, MASD Pressure Injury POA: Yes/No/NA Measurement: right leg has three ulcers on pretibial area, 1.5cm x 1cm x 0.1cm, 2cm x 1.5cm x 0.1cm and 0.2cm round x 0.1cm wound all with pink wound beds and some drainage, no odor. Dorsal area right foot 0.5cm round x 0.1cm pink wound bed.  Left buttock has 2cm x 2cm x 0.1cm 40% pink 60% thin layer of yellow slough, no drainage. Right buttock has 1cm x 1cm x 0.1cm 100% pink wound from MASD. Wound bed: see above Drainage (amount, consistency, odor) see above Periwound: intact legs, MASD buttocks Dressing procedure/placement/frequency: I have provided nurses with orders for Aquacel to right leg and foot wounds, wrapped with kerlix and light compression ace wrap. Usually goes to wound center and has coban wrap, since we want to see wound daily and change dressing daily will use light ace wrap. Have ordered NS wet to dry dressing changes to bilateral buttock wounds, BID. Educated pt on how to care for buttock wounds after discharge. Poor nutritional status, for optimum wound healing would recommended supplements or nutritional consult, please order if you agree. We will not follow, but will remain available to this patient, to nursing, and the medical and/or surgical teams.  Please re-consult if we need to assist further.   Barnett HatterMelinda Damen Windsor, RN-C, WTA-C, OCA Wound Treatment Associate Ostomy Care Associate

## 2017-08-14 LAB — BASIC METABOLIC PANEL
Anion gap: 7 (ref 5–15)
BUN: 9 mg/dL (ref 6–20)
CALCIUM: 8.3 mg/dL — AB (ref 8.9–10.3)
CHLORIDE: 103 mmol/L (ref 101–111)
CO2: 22 mmol/L (ref 22–32)
CREATININE: 0.69 mg/dL (ref 0.61–1.24)
GFR calc non Af Amer: >60 mL/min (ref >60)
GLUCOSE: 144 mg/dL — AB (ref 65–99)
Potassium: 4.1 mmol/L (ref 3.5–5.1)
Sodium: 132 mmol/L — ABNORMAL LOW (ref 135–145)

## 2017-08-14 LAB — URINE CULTURE: CULTURE: NO GROWTH

## 2017-08-14 LAB — CBC
HEMATOCRIT: 26.3 % — AB (ref 39.0–52.0)
HEMOGLOBIN: 8.7 g/dL — AB (ref 13.0–17.0)
MCH: 30 pg (ref 26.0–34.0)
MCHC: 33.1 g/dL (ref 30.0–36.0)
MCV: 90.7 fL (ref 78.0–100.0)
Platelets: 208 10*3/uL (ref 150–400)
RBC: 2.9 MIL/uL — ABNORMAL LOW (ref 4.22–5.81)
RDW: 13.8 % (ref 11.5–15.5)
WBC: 8.7 10*3/uL (ref 4.0–10.5)

## 2017-08-14 LAB — GLUCOSE, CAPILLARY
GLUCOSE-CAPILLARY: 176 mg/dL — AB (ref 65–99)
GLUCOSE-CAPILLARY: 133 mg/dL — AB (ref 65–99)
Glucose-Capillary: 140 mg/dL — ABNORMAL HIGH (ref 65–99)
Glucose-Capillary: 189 mg/dL — ABNORMAL HIGH (ref 65–99)

## 2017-08-14 LAB — LEGIONELLA PNEUMOPHILA SEROGP 1 UR AG: L. pneumophila Serogp 1 Ur Ag: NEGATIVE

## 2017-08-14 MED ORDER — POTASSIUM CHLORIDE CRYS ER 20 MEQ PO TBCR
20.0000 meq | EXTENDED_RELEASE_TABLET | Freq: Every day | ORAL | Status: DC
  Administered 2017-08-14 – 2017-08-15 (×2): 20 meq via ORAL
  Filled 2017-08-14 (×2): qty 1

## 2017-08-14 MED ORDER — GUAIFENESIN 100 MG/5ML PO SOLN
5.0000 mL | Freq: Every evening | ORAL | Status: DC | PRN
  Administered 2017-08-14: 100 mg via ORAL
  Filled 2017-08-14: qty 20

## 2017-08-14 MED ORDER — AZITHROMYCIN 250 MG PO TABS
500.0000 mg | ORAL_TABLET | Freq: Every day | ORAL | Status: DC
  Administered 2017-08-14: 500 mg via ORAL
  Filled 2017-08-14: qty 2

## 2017-08-14 MED ORDER — FUROSEMIDE 20 MG PO TABS
20.0000 mg | ORAL_TABLET | Freq: Every day | ORAL | Status: AC
  Administered 2017-08-14 – 2017-08-15 (×2): 20 mg via ORAL
  Filled 2017-08-14 (×2): qty 1

## 2017-08-14 NOTE — Progress Notes (Signed)
[facility] PROGRESS NOTE  KEENE GILKEY ZOX:[id number] DOB: 27-Jul-1951 DOA: 08/12/2017 PCP: Renford Dills, MD  Brief summary   66 y.o.malewithhistory of CAD status post CABG, COPD, peripheral arterial disease with chronic right lower extremity wound, diabetes mellitus type 2, hypertension was advised to come to the ER by patient's home health aide after patient was found to have fever chills with productive cough.  ER patient was tachycardic labs showed elevated lactic levels leukocytosis and chest x-ray showed bronchopneumonia with abnormal UA.    Assessment/Plan:  Sepsis secondary pneumonia. suspect primarily pulmonary source with increased productive cough. CXR: Right middle lobe bronchopneumonia is suspected, superimposed upon scar/bronchiectasis which was identified on the prior CT. -Continue ceftriaxone and azithromycin for 24-48 hrs, blood culture: NGTD. Influenza is neg. Lactic acidosis has resolved. monitor   CAD status post CABG-denies any chest pain. Continue statins and Plavix.  Hypokalemia, HypoMg replaced. Monitor    Hypertension. Stable, continue  Cozaar. Resume home lasix at low dose for chronic leg edema   COPD. Stable, no wheezing noted, continue nebs PRN  Diabetes mellitus type 2. Hold metformin, sliding-scale insulin. CBGs are stable  Peripheral vascular disease with chronic right leg wound. Followed by the wound center, and has a home health nurse. requested [facility] consult    Code Status: full Family Communication: d/w patient, RN (indicate person spoken with, relationship, and if by phone, the number) Disposition Plan: home when improved. 24-48 hrs    Consultants:  none  Procedures:  none  Antibiotics: Antibiotics Given (last 72 hours)    Date/Time Action Medication Dose Rate   08/12/17 1841 New Bag/Given   piperacillin-tazobactam (ZOSYN) IVPB 3.375 g 3.375 g 100 mL/hr   08/12/17 1844 New Bag/Given   vancomycin (VANCOCIN) IVPB  1000 mg/200 mL premix 1,000 mg 200 mL/hr   08/12/17 2312 New Bag/Given   azithromycin (ZITHROMAX) 500 mg in dextrose 5 % 250 mL IVPB 500 mg 250 mL/hr   08/12/17 2347 New Bag/Given   cefTRIAXone (ROCEPHIN) 1 g in dextrose 5 % 50 mL IVPB 1 g 100 mL/hr   08/13/17 2234 New Bag/Given   cefTRIAXone (ROCEPHIN) 1 g in dextrose 5 % 50 mL IVPB 1 g 100 mL/hr   08/13/17 2330 New Bag/Given   azithromycin (ZITHROMAX) 500 mg in dextrose 5 % 250 mL IVPB 500 mg 250 mL/hr        (indicate start date, and stop date if known)  HPI/Subjective: Alert. Febrile last night. Reports feeling better. +cough. No acute chest pains   Objective: Vitals:   08/14/17 0044 08/14/17 0545  BP:  127/63  Pulse:  88  Resp:  18  Temp: 98 F (36.7 C) 98.1 F (36.7 C)  SpO2:  100%    Intake/Output Summary (Last 24 hours) at 08/14/2017 0950 Last data filed at 08/14/2017 0900 Gross per 24 hour  Intake 1560 ml  Output 1551 ml  Net 9 ml   Filed Weights   08/12/17 1828 08/14/17 0545  Weight: 59 kg (130 lb) 58.9 kg (129 lb 14.4 oz)    Exam:   General:  No distress   Cardiovascular: s1,s2 rrr  Respiratory: diminished in LL  Abdomen: soft, nt   Musculoskeletal: +mild ankle edema    Data Reviewed: Basic Metabolic Panel: Recent Labs  Lab 08/12/17 1813 08/13/17 0015 08/13/17 0734 08/14/17 0552  NA 132* 133*  --  132*  K 3.4* 2.8*  --  4.1  CL 97* 101  --  103  CO2 23 23  --  22  GLUCOSE 191* 262*  --  144*  BUN 17 15  --  9  CREATININE 1.08 1.04  --  0.69  CALCIUM 8.5* 7.4*  --  8.3*  MG  --   --  1.4*  --    Liver Function Tests: Recent Labs  Lab 08/12/17 1813  AST 26  ALT 17  ALKPHOS 53  BILITOT 0.4  PROT 6.4*  ALBUMIN 3.4*   No results for input(s): LIPASE, AMYLASE in the last 168 hours. No results for input(s): AMMONIA in the last 168 hours. CBC: Recent Labs  Lab 08/12/17 1813 08/13/17 0015 08/14/17 0552  WBC 11.9* 10.4 8.7  NEUTROABS 9.0*  --   --   HGB 9.7* 8.5* 8.7*   HCT 29.3* 25.2* 26.3*  MCV 89.3 90.3 90.7  PLT 209 172 208   Cardiac Enzymes: Recent Labs  Lab 08/13/17 0734  TROPONINI <0.03   BNP (last 3 results) No results for input(s): BNP in the last 8760 hours.  ProBNP (last 3 results) No results for input(s): PROBNP in the last 8760 hours.  CBG: Recent Labs  Lab 08/13/17 0733 08/13/17 1157 08/13/17 1725 08/13/17 2153 08/14/17 0726  GLUCAP 108* 144* 169* 167* 140*    Recent Results (from the past 240 hour(s))  Blood Culture (routine x 2)     Status: None (Preliminary result)   Collection Time: 08/12/17  6:14 PM  Result Value Ref Range Status   Specimen Description BLOOD LEFT FOREARM  Final   Special Requests   Final    BOTTLES DRAWN AEROBIC AND ANAEROBIC Blood Culture adequate volume   Culture   Final    NO GROWTH < 24 HOURS Performed at [facility], [address] [place], [place] [address]    Report Status PENDING  Incomplete  Blood Culture (routine x 2)     Status: None (Preliminary result)   Collection Time: 08/12/17  6:19 PM  Result Value Ref Range Status   Specimen Description BLOOD RIGHT FOREARM  Final   Special Requests   Final    BOTTLES DRAWN AEROBIC AND ANAEROBIC Blood Culture adequate volume   Culture   Final    NO GROWTH < 24 HOURS Performed at [facility], [address] [place], [place] [address]    Report Status PENDING  Incomplete  Urine culture     Status: None   Collection Time: 08/12/17  9:03 PM  Result Value Ref Range Status   Specimen Description URINE, RANDOM  Final   Special Requests NONE  Final   Culture   Final    NO GROWTH Performed at [facility], [address] [place], [place] [address]    Report Status 08/14/2017 FINAL  Final  Culture, sputum-assessment     Status: None   Collection Time: 08/13/17  6:12 PM  Result Value Ref Range Status   Specimen Description EXPECTORATED SPUTUM  Final   Special Requests NONE  Final   Sputum evaluation   Final    Sputum  specimen not acceptable for testing.  Please recollect.   RESULT CALLED TO, READ BACK BY AND VERIFIED WITHCheryle Horsfall: H NJANG,RN [phone] @ 2230 BY J SCOTTON    Report Status 08/13/2017 FINAL  Final     Studies: Dg Chest 2 View  Result Date: 08/12/2017 CLINICAL DATA:  One-week history of productive cough. Fever of 102 degrees Fahrenheit noted when the patient was at the wound care center earlier today getting treatment for  a right foot ulcer. EXAM: CHEST  2 VIEW COMPARISON:  Screening CT chest 06/14/2016. Chest x-rays 07/08/2015 and earlier. FINDINGS: Prior sternotomy for CABG. Cardiac silhouette normal in size, unchanged. Thoracic aorta atherosclerotic, unchanged. Hilar and mediastinal contours otherwise unremarkable. Emphysematous changes throughout both lungs. Patchy opacities in the right middle lobe, more so than on the prior examinations, superimposed upon the scar and bronchiectasis identified on the CT. Lungs otherwise clear. No pleural effusions. Degenerative changes involving the thoracic spine. IMPRESSION: Right middle lobe bronchopneumonia is suspected, superimposed upon scar/bronchiectasis which was identified on the prior CT. Electronically Signed   By: Hulan Saashomas  Lawrence M.D.   On: 08/12/2017 19:01    Scheduled Meds: . clopidogrel  75 mg Oral Daily  . enoxaparin (LOVENOX) injection  40 mg Subcutaneous QHS  . glipiZIDE  10 mg Oral BID WC  . guaiFENesin  600 mg Oral BID  . insulin aspart  0-9 Units Subcutaneous TID WC  . irbesartan  300 mg Oral Daily  . simvastatin  40 mg Oral QPM  . tiotropium  18 mcg Inhalation Daily   Continuous Infusions: . azithromycin Stopped (08/14/17 0030)  . cefTRIAXone (ROCEPHIN)  IV Stopped (08/13/17 2304)    Active Problems:   Hx of CABG Feb 2010   Peripheral arterial disease (HCC)   Carotid artery disease (HCC)   Essential hypertension   Sepsis (HCC)   Diabetes mellitus type 2 in nonobese (HCC)   CAP (community acquired pneumonia)    Time spent:  >35 minutes     Esperanza SheetsBURIEV, Jacole Capley N  [facility] Pager [phone]. If 7PM-7AM, please contact night-coverage at [email or web address], password [facility] 08/14/2017, 9:50 AM  LOS: 2 days

## 2017-08-14 NOTE — Progress Notes (Signed)
PHARMACIST - PHYSICIAN COMMUNICATION DR:   York SpanielBuriev CONCERNING: Antibiotic IV to Oral Route Change Policy  RECOMMENDATION: This patient is receiving zithromax by the intravenous route.  Based on criteria approved by the Pharmacy and Therapeutics Committee, the antibiotic(s) is/are being converted to the equivalent oral dose form(s).   DESCRIPTION: These criteria include:  Patient being treated for a respiratory tract infection, urinary tract infection, cellulitis or clostridium difficile associated diarrhea if on metronidazole  The patient is not neutropenic and does not exhibit a GI malabsorption state  The patient is eating (either orally or via tube) and/or has been taking other orally administered medications for a least 24 hours  The patient is improving clinically and has a Tmax < 100.5  If you have questions about this conversion, please contact the Pharmacy Department  []   [phone]( [phone] )  [facility] []   [phone]( [phone] )  [facility] []   [phone]( [phone] )  [facility] []   [phone]( [phone] )  [facility] [x]   [phone]( [phone] )  [facility]  Hessie KnowsJustin M Arren Laminack, PharmD, BCPS 08/14/2017 11:10 AM

## 2017-08-15 DIAGNOSIS — E119 Type 2 diabetes mellitus without complications: Secondary | ICD-10-CM

## 2017-08-15 DIAGNOSIS — I10 Essential (primary) hypertension: Secondary | ICD-10-CM

## 2017-08-15 DIAGNOSIS — I739 Peripheral vascular disease, unspecified: Secondary | ICD-10-CM

## 2017-08-15 DIAGNOSIS — J189 Pneumonia, unspecified organism: Secondary | ICD-10-CM

## 2017-08-15 LAB — BASIC METABOLIC PANEL
Anion gap: 9 (ref 5–15)
BUN: 12 mg/dL (ref 6–20)
CHLORIDE: 98 mmol/L — AB (ref 101–111)
CO2: 24 mmol/L (ref 22–32)
CREATININE: 0.72 mg/dL (ref 0.61–1.24)
Calcium: 8.6 mg/dL — ABNORMAL LOW (ref 8.9–10.3)
GFR calc non Af Amer: >60 mL/min (ref >60)
GLUCOSE: 155 mg/dL — AB (ref 65–99)
Potassium: 4.1 mmol/L (ref 3.5–5.1)
Sodium: 131 mmol/L — ABNORMAL LOW (ref 135–145)

## 2017-08-15 LAB — GLUCOSE, CAPILLARY
GLUCOSE-CAPILLARY: 153 mg/dL — AB (ref 65–99)
GLUCOSE-CAPILLARY: 229 mg/dL — AB (ref 65–99)

## 2017-08-15 MED ORDER — ALBUTEROL SULFATE (2.5 MG/3ML) 0.083% IN NEBU
3.0000 mL | INHALATION_SOLUTION | RESPIRATORY_TRACT | 0 refills | Status: DC | PRN
Start: 2017-08-15 — End: 2017-12-28

## 2017-08-15 MED ORDER — LEVOFLOXACIN 500 MG PO TABS: 500.0000 mg | ORAL_TABLET | Freq: Every day | ORAL | 0 refills | Status: AC

## 2017-08-15 NOTE — Progress Notes (Signed)
DC instructions given. prescription given for 2 meds.  No concerns voiced. Pt remains in room at this time awaiting ride for transport to home.

## 2017-08-15 NOTE — Progress Notes (Signed)
Pt discharged to home. Left unit in wheelchair pushed by this writer accompanied by a male with a child. Left in stable condition and into a car that transported pt to home. Derinda SisVera Jessaca Philippi,rn.

## 2017-08-15 NOTE — Evaluation (Signed)
Physical Therapy Evaluation Patient Details Name: Reginald Ayers MRN: [id number] DOB: 10/14/1950 Today's Date: 08/15/2017   History of Present Illness  66 y.o. male with history of CAD status post CABG, COPD, peripheral arterial disease with chronic right lower extremity wound, diabetes mellitus type 2, hypertension was advised to come to the ER by patient's home health aide after patient was found to have fever chills with productive cough. Dx of PNA, sepsis.  Clinical Impression  Pt ambulated 200' with RW with no loss of balance, SaO2 90% on room air, HR 126 max walking, no dyspnea. He is ready to DC home from PT standpoint. No follow up PT needed. PT signing off.       Follow Up Recommendations No PT follow up    Equipment Recommendations  None recommended by PT    Recommendations for Other Services       Precautions / Restrictions Precautions Precautions: None Precaution Comments: pt denies h/o falls in past 1 year Restrictions Weight Bearing Restrictions: No      Mobility  Bed Mobility Overal bed mobility: Independent                Transfers Overall transfer level: Independent                  Ambulation/Gait Ambulation/Gait assistance: Modified independent (Device/Increase time) Ambulation Distance (Feet): 200 Feet Assistive device: Rolling walker (2 wheeled) Gait Pattern/deviations: WFL(Within Functional Limits)   Gait velocity interpretation: at or above normal speed for age/gender General Gait Details: SaO2 90% on room air walking, HR 126 max walking, no dyspnea, no loss of balance  Stairs            Wheelchair Mobility    Modified Rankin (Stroke Patients Only)       Balance Overall balance assessment: Modified Independent                                           Pertinent Vitals/Pain Pain Assessment: No/denies pain    Home Living Family/patient expects to be discharged to:: Private residence Living  Arrangements: Non-relatives/Friends     Home Access: Level entry     Home Layout: One level Home Equipment: Cane - single point;Walker - 2 wheels Additional Comments: lives with 2 roomates    Prior Function Level of Independence: Independent with assistive device(s)         Comments: walks with cane, sponge bathes independently (no shower 2* RLE wound); uses a cab for transportation     Hand Dominance        Extremity/Trunk Assessment   Upper Extremity Assessment Upper Extremity Assessment: Overall WFL for tasks assessed    Lower Extremity Assessment Lower Extremity Assessment: Overall WFL for tasks assessed    Cervical / Trunk Assessment Cervical / Trunk Assessment: Normal  Communication      Cognition Arousal/Alertness: Awake/alert Behavior During Therapy: WFL for tasks assessed/performed Overall Cognitive Status: Within Functional Limits for tasks assessed                                        General Comments      Exercises     Assessment/Plan    PT Assessment Patent does not need any further PT services  PT Problem List  PT Treatment Interventions      PT Goals (Current goals can be found in the Care Plan section)  Acute Rehab PT Goals PT Goal Formulation: All assessment and education complete, DC therapy    Frequency     Barriers to discharge        Co-evaluation               AM-PAC PT "6 Clicks" Daily Activity  Outcome Measure Difficulty turning over in bed (including adjusting bedclothes, sheets and blankets)?: None Difficulty moving from lying on back to sitting on the side of the bed? : None Difficulty sitting down on and standing up from a chair with arms (e.g., wheelchair, bedside commode, etc,.)?: None Help needed moving to and from a bed to chair (including a wheelchair)?: None Help needed walking in hospital room?: None Help needed climbing 3-5 steps with a railing? : None 6 Click Score: 24     End of Session Equipment Utilized During Treatment: Gait belt Activity Tolerance: Patient tolerated treatment well Patient left: in chair;with chair alarm set;with call bell/phone within reach Nurse Communication: Mobility status      Time: [id number] PT Time Calculation (min) (ACUTE ONLY): 15 min   Charges:   PT Evaluation $PT Eval Low Complexity: 1 Low     PT G Codes:          Tamala SerUhlenberg, Alvilda Mckenna Kistler 08/15/2017, 9:39 AM [phone]

## 2017-08-15 NOTE — Discharge Summary (Signed)
Physician Discharge Summary  Reginald Ayers ZOX:[id number] DOB: Apr 10, 1951 DOA: 08/12/2017  PCP: Reginald DillsPolite, Ronald, MD  Admit date: 08/12/2017 Discharge date: 08/15/2017  Admitted From:home Disposition:home  Recommendations for Outpatient Follow-up:  1. Follow up with PCP in 1-2 weeks 2. Please obtain BMP/CBC in one week   Home Health:no Equipment/Devices:none Discharge Condition:stable CODE STATUS:full code Diet recommendation:heart healthy  Brief/Interim Summary: 66 y.o.malewithhistory of CAD status post CABG, COPD, peripheral arterial disease with chronic right lower extremity wound, diabetes mellitus type 2, hypertension was advised to come to the ER by patient's home health aide after patient was found tohavefever chills with productive cough.  ER patient was tachycardic labs showed elevated lactic levels leukocytosis and chest x-ray showed bronchopneumonia.  #Sepsis due to right middle lobe bronchopneumonia: Treated with ceftriaxone and azithromycin with clinical improvement.  Sepsis parameter Synthroid.  Shortness of breath and cough improved.  Transition to oral Levaquin for 5 more days on discharge.  Recommended to follow-up with PCP.  History of coronary artery disease status post CABG: Stable continue aspirin Plavix and statin.   Hypokalemia and hypomagnesemia improved Hypertension: Resume home medication.  Monitor blood pressure. History of COPD: Stable now.  Prescribed albuterol nebulizer liquid as per patient request. Type 2 diabetes: Resume home medication.  Recommend to monitor blood sugar level and follow-up with PCP. History of peripheral vascular disease with chronic right leg wound: Patient follows at wound care center.  Patient reported that he has follow-up appointment on December 27.  He feels better.  Denied headache, dizziness, nausea vomiting chest pain shortness of breath.  He is breathing well.  He is on room air.   Discharge Diagnoses:  Principal  diagnosis: Right middle lobe pneumonia.  Active Problems:   Hx of CABG Feb 2010   Peripheral arterial disease (HCC)   Carotid artery disease (HCC)   Essential hypertension   Sepsis (HCC)   Diabetes mellitus type 2 in nonobese (HCC)   CAP (community acquired pneumonia)    Discharge Instructions  Discharge Instructions    Call MD for:  difficulty breathing, headache or visual disturbances   Complete by:  As directed    Call MD for:  extreme fatigue   Complete by:  As directed    Call MD for:  hives   Complete by:  As directed    Call MD for:  persistant dizziness or light-headedness   Complete by:  As directed    Call MD for:  persistant nausea and vomiting   Complete by:  As directed    Call MD for:  severe uncontrolled pain   Complete by:  As directed    Call MD for:  temperature >100.4   Complete by:  As directed    Diet - low sodium heart healthy   Complete by:  As directed    Increase activity slowly   Complete by:  As directed      Allergies as of 08/15/2017      Reactions   Actos [pioglitazone] Shortness Of Breath   Leg swelling    Lisinopril Cough      Medication List    STOP taking these medications   naproxen sodium 220 MG tablet Commonly known as:  ALEVE     TAKE these medications   albuterol 108 (90 Base) MCG/ACT inhaler Commonly known as:  PROVENTIL HFA;VENTOLIN HFA Inhale 2 puffs into the lungs every 4 (four) hours as needed for wheezing or shortness of breath.   albuterol (2.5 MG/3ML) 0.083% nebulizer solution  Commonly known as:  PROVENTIL Inhale 3 mLs into the lungs every 3 (three) hours as needed for shortness of breath.   aspirin EC 325 MG tablet Take 650 mg by mouth daily as needed for mild pain.   B COMPLEX PO Take 1 tablet by mouth daily.   BOIL-EASE EX Apply 1 application topically daily as needed (BOIL).   clopidogrel 75 MG tablet Commonly known as:  PLAVIX Take 75 mg by mouth daily.   furosemide 40 MG tablet Commonly  known as:  LASIX Take 40 mg by mouth daily as needed for edema.   glipiZIDE 10 MG 24 hr tablet Commonly known as:  GLUCOTROL XL Take 10 mg by mouth 2 (two) times daily.   ibuprofen 200 MG tablet Commonly known as:  ADVIL,MOTRIN Take 800 mg by mouth every 8 (eight) hours as needed for moderate pain.   irbesartan 300 MG tablet Commonly known as:  AVAPRO Take 300 mg by mouth daily.   IRON PO Take 1 tablet by mouth daily.   LEG CRAMP RELIEF SL Place 2 tablets under the tongue at bedtime as needed (leg cramps).   levofloxacin 500 MG tablet Commonly known as:  LEVAQUIN Take 1 tablet (500 mg total) by mouth daily for 5 days.   metFORMIN 500 MG tablet Commonly known as:  GLUCOPHAGE Take 1,000 mg by mouth 2 (two) times daily with a meal.   multivitamin with minerals Tabs tablet Take 1 tablet by mouth daily.   MURINE TEARS FOR DRY EYES OP Apply 1 drop to eye 3 (three) times daily as needed (dry eyes).   NITROSTAT 0.4 MG SL tablet Generic drug:  nitroGLYCERIN Take 0.4 mg by mouth every 5 (five) minutes as needed for chest pain.   NYQUIL PO Take 1 Dose by mouth at bedtime.   simvastatin 40 MG tablet Commonly known as:  ZOCOR Take 40 mg by mouth every evening.   SPIRIVA HANDIHALER 18 MCG inhalation capsule Generic drug:  tiotropium Place 18 mcg into inhaler and inhale daily.   traMADol 50 MG tablet Commonly known as:  ULTRAM Take 50 mg by mouth 3 (three) times daily as needed for severe pain.   vitamin B-12 1000 MCG tablet Commonly known as:  CYANOCOBALAMIN Take 1,000 mcg by mouth daily as needed (tingling).   VITAMIN B-12 IJ Inject 1 Dose as directed every 30 (thirty) days.      Follow-up Information    Reginald DillsPolite, Ronald, MD. Schedule an appointment as soon as possible for a visit in 1 week(s).   Specialty:  Internal Medicine Contact information: [address] [place] [address] [phone]          Allergies  Allergen Reactions  .  Actos [Pioglitazone] Shortness Of Breath    Leg swelling   . Lisinopril Cough    Consultations: None  Procedures/Studies: None  Subjective: Seen and examined at bedside.  Patient is sitting on chair comfortable.  Denies headache, dizziness, nausea vomiting chest pain shortness of breath.  Feels good.  Discharge Exam: Vitals:   08/14/17 2117 08/15/17 0558  BP: (!) 160/80 128/60  Pulse: 96 93  Resp: 16 16  Temp: 98.3 F (36.8 C) 98 F (36.7 C)  SpO2: 97% 97%   Vitals:   08/14/17 1357 08/14/17 2033 08/14/17 2117 08/15/17 0558  BP: 135/90  (!) 160/80 128/60  Pulse: 92  96 93  Resp: 18  16 16   Temp: 98 F (36.7 C)  98.3 F (36.8 C) 98  F (36.7 C)  TempSrc: Oral  Oral Oral  SpO2: 99% 97% 97% 97%  Weight:    54.9 kg (121 lb 1.6 oz)  Height:        General: Pt is alert, awake, not in acute distress Cardiovascular: RRR, S1/S2 +, no rubs, no gallops Respiratory: CTA bilaterally, no wheezing, no rhonchi Abdominal: Soft, NT, ND, bowel sounds + Extremities: no edema, no cyanosis    The results of significant diagnostics from this hospitalization (including imaging, microbiology, ancillary and laboratory) are listed below for reference.     Microbiology: Recent Results (from the past 240 hour(s))  Blood Culture (routine x 2)     Status: None (Preliminary result)   Collection Time: 08/12/17  6:14 PM  Result Value Ref Range Status   Specimen Description BLOOD LEFT FOREARM  Final   Special Requests   Final    BOTTLES DRAWN AEROBIC AND ANAEROBIC Blood Culture adequate volume   Culture   Final    NO GROWTH 2 DAYS Performed at [facility], [address] [place], [place] [address]    Report Status PENDING  Incomplete  Blood Culture (routine x 2)     Status: None (Preliminary result)   Collection Time: 08/12/17  6:19 PM  Result Value Ref Range Status   Specimen Description BLOOD RIGHT FOREARM  Final   Special Requests   Final    BOTTLES DRAWN AEROBIC AND  ANAEROBIC Blood Culture adequate volume   Culture   Final    NO GROWTH 2 DAYS Performed at [facility], [address], [place], [place] [address]    Report Status PENDING  Incomplete  Urine culture     Status: None   Collection Time: 08/12/17  9:03 PM  Result Value Ref Range Status   Specimen Description URINE, RANDOM  Final   Special Requests NONE  Final   Culture   Final    NO GROWTH Performed at [facility], [address] [place], [place] [address]    Report Status 08/14/2017 FINAL  Final  Culture, sputum-assessment     Status: None   Collection Time: 08/13/17  6:12 PM  Result Value Ref Range Status   Specimen Description EXPECTORATED SPUTUM  Final   Special Requests NONE  Final   Sputum evaluation   Final    Sputum specimen not acceptable for testing.  Please recollect.   RESULT CALLED TO, READ BACK BY AND VERIFIED WITHCheryle Horsfall [id number] @ 2230 BY J SCOTTON    Report Status 08/13/2017 FINAL  Final     Labs: BNP (last 3 results) No results for input(s): BNP in the last 8760 hours. Basic Metabolic Panel: Recent Labs  Lab 08/12/17 1813 08/13/17 0015 08/13/17 0734 08/14/17 0552 08/15/17 0508  NA 132* 133*  --  132* 131*  K 3.4* 2.8*  --  4.1 4.1  CL 97* 101  --  103 98*  CO2 23 23  --  22 24  GLUCOSE 191* 262*  --  144* 155*  BUN 17 15  --  9 12  CREATININE 1.08 1.04  --  0.69 0.72  CALCIUM 8.5* 7.4*  --  8.3* 8.6*  MG  --   --  1.4*  --   --    Liver Function Tests: Recent Labs  Lab 08/12/17 1813  AST 26  ALT 17  ALKPHOS 53  BILITOT 0.4  PROT 6.4*  ALBUMIN 3.4*   No results for input(s):  LIPASE, AMYLASE in the last 168 hours. No results for input(s): AMMONIA in the last 168 hours. CBC: Recent Labs  Lab 08/12/17 1813 08/13/17 0015 08/14/17 0552  WBC 11.9* 10.4 8.7  NEUTROABS 9.0*  --   --   HGB 9.7* 8.5* 8.7*  HCT 29.3* 25.2* 26.3*  MCV 89.3 90.3 90.7  PLT 209 172 208   Cardiac Enzymes: Recent Labs  Lab 08/13/17 0734   TROPONINI <0.03   BNP: Invalid input(s): POCBNP CBG: Recent Labs  Lab 08/14/17 1205 08/14/17 1658 08/14/17 2115 08/15/17 0752 08/15/17 1151  GLUCAP 176* 133* 189* 153* 229*   D-Dimer No results for input(s): DDIMER in the last 72 hours. Hgb A1c No results for input(s): HGBA1C in the last 72 hours. Lipid Profile No results for input(s): CHOL, HDL, LDLCALC, TRIG, CHOLHDL, LDLDIRECT in the last 72 hours. Thyroid function studies No results for input(s): TSH, T4TOTAL, T3FREE, THYROIDAB in the last 72 hours.  Invalid input(s): FREET3 Anemia work up No results for input(s): VITAMINB12, FOLATE, FERRITIN, TIBC, IRON, RETICCTPCT in the last 72 hours. Urinalysis    Component Value Date/Time   COLORURINE YELLOW 08/12/2017 2103   APPEARANCEUR CLEAR 08/12/2017 2103   LABSPEC 1.010 08/12/2017 2103   PHURINE 5.0 08/12/2017 2103   GLUCOSEU NEGATIVE 08/12/2017 2103   HGBUR SMALL (A) 08/12/2017 2103   BILIRUBINUR NEGATIVE 08/12/2017 2103   KETONESUR NEGATIVE 08/12/2017 2103   PROTEINUR NEGATIVE 08/12/2017 2103   UROBILINOGEN 0.2 07/07/2015 1842   NITRITE NEGATIVE 08/12/2017 2103   LEUKOCYTESUR LARGE (A) 08/12/2017 2103   Sepsis Labs Invalid input(s): PROCALCITONIN,  WBC,  LACTICIDVEN Microbiology Recent Results (from the past 240 hour(s))  Blood Culture (routine x 2)     Status: None (Preliminary result)   Collection Time: 08/12/17  6:14 PM  Result Value Ref Range Status   Specimen Description BLOOD LEFT FOREARM  Final   Special Requests   Final    BOTTLES DRAWN AEROBIC AND ANAEROBIC Blood Culture adequate volume   Culture   Final    NO GROWTH 2 DAYS Performed at [facility], [address] [place], [place] [address]    Report Status PENDING  Incomplete  Blood Culture (routine x 2)     Status: None (Preliminary result)   Collection Time: 08/12/17  6:19 PM  Result Value Ref Range Status   Specimen Description BLOOD RIGHT FOREARM  Final   Special Requests   Final     BOTTLES DRAWN AEROBIC AND ANAEROBIC Blood Culture adequate volume   Culture   Final    NO GROWTH 2 DAYS Performed at [facility], [address], [place], [place] [address]    Report Status PENDING  Incomplete  Urine culture     Status: None   Collection Time: 08/12/17  9:03 PM  Result Value Ref Range Status   Specimen Description URINE, RANDOM  Final   Special Requests NONE  Final   Culture   Final    NO GROWTH Performed at [facility], [address], [place], [place] [address]    Report Status 08/14/2017 FINAL  Final  Culture, sputum-assessment     Status: None   Collection Time: 08/13/17  6:12 PM  Result Value Ref Range Status   Specimen Description EXPECTORATED SPUTUM  Final   Special Requests NONE  Final   Sputum evaluation   Final    Sputum specimen not acceptable for testing.  Please recollect.   RESULT CALLED TO, READ BACK  BY AND VERIFIED WITHCheryle Horsfall [id number] @ 2230 BY J SCOTTON    Report Status 08/13/2017 FINAL  Final     Time coordinating discharge: 31 minutes  SIGNED:   Maxie Barb, MD  [facility] 08/15/2017, 11:59 AM  If 7PM-7AM, please contact night-coverage [email or web address] Password [email or web address]

## 2017-08-15 NOTE — Care Management Important Message (Signed)
Important Message  Patient Details  Name: Reginald Ayers MRN: [id number] Date of Birth: 04/19/1951   Medicare Important Message Given:  Yes    Elliot CousinShavis, Paloma [place] Ellen, RN 08/15/2017, 3:09 PM

## 2017-08-15 NOTE — Care Management Note (Signed)
Case Management Note  Patient Details  Name: Reginald Ayers MRN: [id number] Date of Birth: [birth year]  Subjective/Objective:  Sepsis d/t right middle lobe bronchopneumonia                  Action/Plan: Discharge Planning: [facility], attending notified for a resumption of care.   Expected Discharge Date:  08/15/17               Expected Discharge Plan:  Home w Home Health Services  In-House Referral:  NA  Discharge planning Services  CM Consult  Post Acute Care Choice:  Home Health Choice offered to:  Patient  DME Arranged:  N/A DME Agency:  NA  HH Arranged:  RN HH Agency:  [facility]  Status of Service:  Completed, signed off  If discussed at Long Length of Stay Meetings, dates discussed:    Additional Comments:  Elliot CousinShavis, Sala [place] Ellen, RN 08/15/2017, 3:07 PM

## 2017-08-16 DIAGNOSIS — E1151 Type 2 diabetes mellitus with diabetic peripheral angiopathy without gangrene: Secondary | ICD-10-CM | POA: Diagnosis not present

## 2017-08-16 DIAGNOSIS — E11622 Type 2 diabetes mellitus with other skin ulcer: Secondary | ICD-10-CM | POA: Diagnosis not present

## 2017-08-16 DIAGNOSIS — Z4800 Encounter for change or removal of nonsurgical wound dressing: Secondary | ICD-10-CM | POA: Diagnosis not present

## 2017-08-16 DIAGNOSIS — L97312 Non-pressure chronic ulcer of right ankle with fat layer exposed: Secondary | ICD-10-CM | POA: Diagnosis not present

## 2017-08-16 DIAGNOSIS — Z7984 Long term (current) use of oral hypoglycemic drugs: Secondary | ICD-10-CM | POA: Diagnosis not present

## 2017-08-16 DIAGNOSIS — L97812 Non-pressure chronic ulcer of other part of right lower leg with fat layer exposed: Secondary | ICD-10-CM | POA: Diagnosis not present

## 2017-08-17 LAB — CULTURE, BLOOD (ROUTINE X 2)
CULTURE: NO GROWTH
CULTURE: NO GROWTH
SPECIAL REQUESTS: ADEQUATE
Special Requests: ADEQUATE

## 2017-08-19 DIAGNOSIS — L97812 Non-pressure chronic ulcer of other part of right lower leg with fat layer exposed: Secondary | ICD-10-CM | POA: Diagnosis not present

## 2017-08-19 DIAGNOSIS — E11319 Type 2 diabetes mellitus with unspecified diabetic retinopathy without macular edema: Secondary | ICD-10-CM | POA: Diagnosis not present

## 2017-08-19 DIAGNOSIS — I2510 Atherosclerotic heart disease of native coronary artery without angina pectoris: Secondary | ICD-10-CM | POA: Diagnosis not present

## 2017-08-19 DIAGNOSIS — I739 Peripheral vascular disease, unspecified: Secondary | ICD-10-CM | POA: Diagnosis not present

## 2017-08-19 DIAGNOSIS — I10 Essential (primary) hypertension: Secondary | ICD-10-CM | POA: Diagnosis not present

## 2017-08-19 DIAGNOSIS — E1151 Type 2 diabetes mellitus with diabetic peripheral angiopathy without gangrene: Secondary | ICD-10-CM | POA: Diagnosis not present

## 2017-08-19 DIAGNOSIS — L97312 Non-pressure chronic ulcer of right ankle with fat layer exposed: Secondary | ICD-10-CM | POA: Diagnosis not present

## 2017-08-19 DIAGNOSIS — E7800 Pure hypercholesterolemia, unspecified: Secondary | ICD-10-CM | POA: Diagnosis not present

## 2017-08-19 DIAGNOSIS — D510 Vitamin B12 deficiency anemia due to intrinsic factor deficiency: Secondary | ICD-10-CM | POA: Diagnosis not present

## 2017-08-19 DIAGNOSIS — E11622 Type 2 diabetes mellitus with other skin ulcer: Secondary | ICD-10-CM | POA: Diagnosis not present

## 2017-08-19 DIAGNOSIS — J449 Chronic obstructive pulmonary disease, unspecified: Secondary | ICD-10-CM | POA: Diagnosis not present

## 2017-08-19 DIAGNOSIS — Z4800 Encounter for change or removal of nonsurgical wound dressing: Secondary | ICD-10-CM | POA: Diagnosis not present

## 2017-08-19 DIAGNOSIS — Z7984 Long term (current) use of oral hypoglycemic drugs: Secondary | ICD-10-CM | POA: Diagnosis not present

## 2017-08-19 DIAGNOSIS — J189 Pneumonia, unspecified organism: Secondary | ICD-10-CM | POA: Diagnosis not present

## 2017-08-19 DIAGNOSIS — N182 Chronic kidney disease, stage 2 (mild): Secondary | ICD-10-CM | POA: Diagnosis not present

## 2017-08-19 DIAGNOSIS — Z23 Encounter for immunization: Secondary | ICD-10-CM | POA: Diagnosis not present

## 2017-08-22 DIAGNOSIS — L97312 Non-pressure chronic ulcer of right ankle with fat layer exposed: Secondary | ICD-10-CM | POA: Diagnosis not present

## 2017-08-22 DIAGNOSIS — Z4800 Encounter for change or removal of nonsurgical wound dressing: Secondary | ICD-10-CM | POA: Diagnosis not present

## 2017-08-22 DIAGNOSIS — E1151 Type 2 diabetes mellitus with diabetic peripheral angiopathy without gangrene: Secondary | ICD-10-CM | POA: Diagnosis not present

## 2017-08-22 DIAGNOSIS — E11622 Type 2 diabetes mellitus with other skin ulcer: Secondary | ICD-10-CM | POA: Diagnosis not present

## 2017-08-22 DIAGNOSIS — Z7984 Long term (current) use of oral hypoglycemic drugs: Secondary | ICD-10-CM | POA: Diagnosis not present

## 2017-08-22 DIAGNOSIS — L97812 Non-pressure chronic ulcer of other part of right lower leg with fat layer exposed: Secondary | ICD-10-CM | POA: Diagnosis not present

## 2017-08-26 ENCOUNTER — Encounter (HOSPITAL_BASED_OUTPATIENT_CLINIC_OR_DEPARTMENT_OTHER): Payer: Medicare Other | Attending: Internal Medicine

## 2017-08-26 DIAGNOSIS — Z9221 Personal history of antineoplastic chemotherapy: Secondary | ICD-10-CM | POA: Insufficient documentation

## 2017-08-26 DIAGNOSIS — E1151 Type 2 diabetes mellitus with diabetic peripheral angiopathy without gangrene: Secondary | ICD-10-CM | POA: Insufficient documentation

## 2017-08-26 DIAGNOSIS — E11622 Type 2 diabetes mellitus with other skin ulcer: Secondary | ICD-10-CM | POA: Diagnosis not present

## 2017-08-26 DIAGNOSIS — J449 Chronic obstructive pulmonary disease, unspecified: Secondary | ICD-10-CM | POA: Diagnosis not present

## 2017-08-26 DIAGNOSIS — I2510 Atherosclerotic heart disease of native coronary artery without angina pectoris: Secondary | ICD-10-CM | POA: Diagnosis not present

## 2017-08-26 DIAGNOSIS — L97512 Non-pressure chronic ulcer of other part of right foot with fat layer exposed: Secondary | ICD-10-CM | POA: Diagnosis not present

## 2017-08-26 DIAGNOSIS — I10 Essential (primary) hypertension: Secondary | ICD-10-CM | POA: Diagnosis not present

## 2017-08-26 DIAGNOSIS — E11621 Type 2 diabetes mellitus with foot ulcer: Secondary | ICD-10-CM | POA: Diagnosis not present

## 2017-08-26 DIAGNOSIS — L97812 Non-pressure chronic ulcer of other part of right lower leg with fat layer exposed: Secondary | ICD-10-CM | POA: Diagnosis not present

## 2017-08-26 DIAGNOSIS — L89322 Pressure ulcer of left buttock, stage 2: Secondary | ICD-10-CM | POA: Diagnosis not present

## 2017-08-26 DIAGNOSIS — L97212 Non-pressure chronic ulcer of right calf with fat layer exposed: Secondary | ICD-10-CM | POA: Diagnosis not present

## 2017-08-30 ENCOUNTER — Encounter: Payer: Self-pay | Admitting: Surgery

## 2017-08-30 ENCOUNTER — Other Ambulatory Visit: Payer: Self-pay | Admitting: *Deleted

## 2017-08-30 ENCOUNTER — Ambulatory Visit (INDEPENDENT_AMBULATORY_CARE_PROVIDER_SITE_OTHER): Payer: Medicare Other | Admitting: Surgery

## 2017-08-30 ENCOUNTER — Encounter: Payer: Self-pay | Admitting: *Deleted

## 2017-08-30 VITALS — BP 164/87 | HR 96 | Temp 97.1°F | Resp 20 | Ht 61.0 in | Wt 130.0 lb

## 2017-08-30 DIAGNOSIS — I70233 Atherosclerosis of native arteries of right leg with ulceration of ankle: Secondary | ICD-10-CM | POA: Diagnosis not present

## 2017-08-30 NOTE — H&P (View-Only) (Signed)
[facility] [place]  Patient name: Reginald Ayers MRN: [id number] DOB: March 12, 1951 Sex: male   REASON FOR VISIT:    Follow up  HISOTRY OF PRESENT ILLNESS:    Reginald Ayers is a 67 y.o. male who returns today for follow-up.  I initially saw him in January 2018 for a second opinion regarding ulcers in her right leg.  He has a history of angioplasty and stenting to the right leg.  He also has known left carotid occlusion in the setting of moderate right-sided stenosis.  Previously, I was able to recanalize his occluded right superficial femoral artery stent and place a Viabahn.  (09/22/2016).  On 12/08/2016 he underwent atherectomy and angioplasty of a 80% right posterior tibial artery stenosis.  On 04/20/2017 he was found to have ostial stenosis of the stent within his right superficial femoral artery.  This was treated with drug-coated balloon angioplasty.  Patient recently had an ultrasound that shows occlusion of a short segment of the right superficial femoral artery stents.  He still has wounds on his right leg that are getting smaller but still present.   PAST MEDICAL HISTORY:   Past Medical History:  Diagnosis Date  . Carotid artery disease (HCC)    L-ICA 100%, mod R-ICA dz  . COPD (chronic obstructive pulmonary disease) (HCC)   . Coronary artery disease    s/p CABG February 2010 by Dr. Andrey Spearman  . Diabetes (HCC)   . Emphysema lung (HCC)   . Hyperlipidemia   . Hypertension   . Peripheral arterial disease (HCC)    post left common iliac and right SFA stenting remotely  . Tobacco abuse          FAMILY HISTORY:   Family History  Problem Relation Age of Onset  . Heart attack Mother   . CVA Mother   . Heart disease Mother   . Diabetes Sister   . Hypertension Sister     SOCIAL HISTORY:   Social History   Tobacco Use  . Smoking status: Light Tobacco Smoker    Packs/day: 0.25    Types: Cigarettes      Last attempt to quit: 08/07/2016    Years since quitting: 1.0  . Smokeless tobacco: Current User    Types: Chew  Substance Use Topics  . Alcohol use: No    Alcohol/week: 0.0 oz    Comment: Weekend drinker, quit 2000.      ALLERGIES:   Allergies  Allergen Reactions  . Actos [Pioglitazone] Shortness Of Breath    Leg swelling   . Lisinopril Cough     CURRENT MEDICATIONS:   Current Outpatient Medications  Medication Sig Dispense Refill  . albuterol (PROVENTIL HFA;VENTOLIN HFA) 108 (90 Base) MCG/ACT inhaler Inhale 2 puffs into the lungs every 4 (four) hours as needed for wheezing or shortness of breath.     Marland Kitchen albuterol (PROVENTIL) (2.5 MG/3ML) 0.083% nebulizer solution Inhale 3 mLs into the lungs every 3 (three) hours as needed for shortness of breath. 75 mL 0  . aspirin EC 325 MG tablet Take 650 mg by mouth daily as needed for mild pain.     . B Complex Vitamins (B COMPLEX PO) Take 1 tablet by mouth daily.    . Benzocaine (BOIL-EASE EX) Apply 1 application topically daily as needed (BOIL).    Marland Kitchen clopidogrel (PLAVIX) 75 MG tablet Take 75 mg by mouth daily.     . Cyanocobalamin (VITAMIN B-12 IJ) Inject 1 Dose as  directed every 30 (thirty) days.     . furosemide (LASIX) 40 MG tablet Take 40 mg by mouth daily as needed for edema.    Marland Kitchen glipiZIDE (GLUCOTROL XL) 10 MG 24 hr tablet Take 10 mg by mouth 2 (two) times daily.     . Homeopathic Products (LEG CRAMP RELIEF SL) Place 2 tablets under the tongue at bedtime as needed (leg cramps).     Marland Kitchen ibuprofen (ADVIL,MOTRIN) 200 MG tablet Take 800 mg by mouth every 8 (eight) hours as needed for moderate pain.    Marland Kitchen irbesartan (AVAPRO) 300 MG tablet Take 300 mg by mouth daily.     . IRON PO Take 1 tablet by mouth daily.    . metFORMIN (GLUCOPHAGE) 500 MG tablet Take 1,000 mg by mouth 2 (two) times daily with a meal.     . Multiple Vitamin (MULTIVITAMIN WITH MINERALS) TABS tablet Take 1 tablet by mouth daily.    Marland Kitchen NITROSTAT 0.4 MG SL tablet Take  0.4 mg by mouth every 5 (five) minutes as needed for chest pain.   0  . Polyvinyl Alcohol-Povidone (MURINE TEARS FOR DRY EYES OP) Apply 1 drop to eye 3 (three) times daily as needed (dry eyes).    . Pseudoeph-Doxylamine-DM-APAP (NYQUIL PO) Take 1 Dose by mouth at bedtime.     . simvastatin (ZOCOR) 40 MG tablet Take 40 mg by mouth every evening.    Marland Kitchen SPIRIVA HANDIHALER 18 MCG inhalation capsule Place 18 mcg into inhaler and inhale daily.     . traMADol (ULTRAM) 50 MG tablet Take 50 mg by mouth 3 (three) times daily as needed for severe pain.     . vitamin B-12 (CYANOCOBALAMIN) 1000 MCG tablet Take 1,000 mcg by mouth daily as needed (tingling).     No current facility-administered medications for this visit.     REVIEW OF SYSTEMS:   [X]  denotes positive finding, [ ]  denotes negative finding Cardiac  Comments:  Chest pain or chest pressure:    Shortness of breath upon exertion:    Short of breath when lying flat:    Irregular heart rhythm:        Vascular    Pain in calf, thigh, or hip brought on by ambulation:    Pain in feet at night that wakes you up from your sleep:     Blood clot in your veins:    Leg swelling:         Pulmonary    Oxygen at home:    Productive cough:     Wheezing:         Neurologic    Sudden weakness in arms or legs:     Sudden numbness in arms or legs:     Sudden onset of difficulty speaking or slurred speech:    Temporary loss of vision in one eye:     Problems with dizziness:         Gastrointestinal    Blood in stool:     Vomited blood:         Genitourinary    Burning when urinating:     Blood in urine:        Psychiatric    Major depression:         Hematologic    Bleeding problems:    Problems with blood clotting too easily:        Skin    Rashes or ulcers: x       Constitutional  Fever or chills:      PHYSICAL EXAM:   Vitals:   08/30/17 1606  BP: (!) 164/87  Pulse: 96  Resp: 20  Temp: (!) 97.1 F (36.2 C)  TempSrc:  Oral  SpO2: 99%  Weight: 130 lb (59 kg)  Height: 5\' 1"  (1.549 m)    GENERAL: The patient is a well-nourished male, in no acute distress. The vital signs are documented above. CARDIAC: There is a regular rate and rhythm.  VASCULAR: Nonpalpable pedal pulse bilateral PULMONARY: Non-labored respirations MUSCULOSKELETAL: There are no major deformities or cyanosis. NEUROLOGIC: No focal weakness or paresthesias are detected. SKIN: 2 ulcers PSYCHIATRIC: The patient has a normal affect.  STUDIES:   I have reviewed his ultrasound which shows short segment occlusion of the right superficial femoral artery stent.  MEDICAL ISSUES:   I discussed with the patient that he has had limited durability of percutaneous interventions.  I think he needs to undergo a repeat arteriogram via a left femoral access to define his anatomy and determine what his options are for lower extremity bypass.  If there is a way to preserve the duration of his stents I will try that at this time, however I suspect he is getting the surgical revascularization.  His procedure is been scheduled for Tuesday, January 15    Durene CalWells Passion Lavin, MD [facility] Tel [phone] Pager [phone]

## 2017-08-30 NOTE — Progress Notes (Signed)
[facility] [place]  Patient name: Reginald Ayers MRN: [id number] DOB: March 12, 1951 Sex: male   REASON FOR VISIT:    Follow up  HISOTRY OF PRESENT ILLNESS:    SADLER TESCHNER is a 67 y.o. male who returns today for follow-up.  I initially saw him in January 2018 for a second opinion regarding ulcers in her right leg.  He has a history of angioplasty and stenting to the right leg.  He also has known left carotid occlusion in the setting of moderate right-sided stenosis.  Previously, I was able to recanalize his occluded right superficial femoral artery stent and place a Viabahn.  (09/22/2016).  On 12/08/2016 he underwent atherectomy and angioplasty of a 80% right posterior tibial artery stenosis.  On 04/20/2017 he was found to have ostial stenosis of the stent within his right superficial femoral artery.  This was treated with drug-coated balloon angioplasty.  Patient recently had an ultrasound that shows occlusion of a short segment of the right superficial femoral artery stents.  He still has wounds on his right leg that are getting smaller but still present.   PAST MEDICAL HISTORY:   Past Medical History:  Diagnosis Date  . Carotid artery disease (HCC)    L-ICA 100%, mod R-ICA dz  . COPD (chronic obstructive pulmonary disease) (HCC)   . Coronary artery disease    s/p CABG February 2010 by Dr. Andrey Spearman  . Diabetes (HCC)   . Emphysema lung (HCC)   . Hyperlipidemia   . Hypertension   . Peripheral arterial disease (HCC)    post left common iliac and right SFA stenting remotely  . Tobacco abuse          FAMILY HISTORY:   Family History  Problem Relation Age of Onset  . Heart attack Mother   . CVA Mother   . Heart disease Mother   . Diabetes Sister   . Hypertension Sister     SOCIAL HISTORY:   Social History   Tobacco Use  . Smoking status: Light Tobacco Smoker    Packs/day: 0.25    Types: Cigarettes      Last attempt to quit: 08/07/2016    Years since quitting: 1.0  . Smokeless tobacco: Current User    Types: Chew  Substance Use Topics  . Alcohol use: No    Alcohol/week: 0.0 oz    Comment: Weekend drinker, quit 2000.      ALLERGIES:   Allergies  Allergen Reactions  . Actos [Pioglitazone] Shortness Of Breath    Leg swelling   . Lisinopril Cough     CURRENT MEDICATIONS:   Current Outpatient Medications  Medication Sig Dispense Refill  . albuterol (PROVENTIL HFA;VENTOLIN HFA) 108 (90 Base) MCG/ACT inhaler Inhale 2 puffs into the lungs every 4 (four) hours as needed for wheezing or shortness of breath.     Marland Kitchen albuterol (PROVENTIL) (2.5 MG/3ML) 0.083% nebulizer solution Inhale 3 mLs into the lungs every 3 (three) hours as needed for shortness of breath. 75 mL 0  . aspirin EC 325 MG tablet Take 650 mg by mouth daily as needed for mild pain.     . B Complex Vitamins (B COMPLEX PO) Take 1 tablet by mouth daily.    . Benzocaine (BOIL-EASE EX) Apply 1 application topically daily as needed (BOIL).    Marland Kitchen clopidogrel (PLAVIX) 75 MG tablet Take 75 mg by mouth daily.     . Cyanocobalamin (VITAMIN B-12 IJ) Inject 1 Dose as  directed every 30 (thirty) days.     . furosemide (LASIX) 40 MG tablet Take 40 mg by mouth daily as needed for edema.    Marland Kitchen glipiZIDE (GLUCOTROL XL) 10 MG 24 hr tablet Take 10 mg by mouth 2 (two) times daily.     . Homeopathic Products (LEG CRAMP RELIEF SL) Place 2 tablets under the tongue at bedtime as needed (leg cramps).     Marland Kitchen ibuprofen (ADVIL,MOTRIN) 200 MG tablet Take 800 mg by mouth every 8 (eight) hours as needed for moderate pain.    Marland Kitchen irbesartan (AVAPRO) 300 MG tablet Take 300 mg by mouth daily.     . IRON PO Take 1 tablet by mouth daily.    . metFORMIN (GLUCOPHAGE) 500 MG tablet Take 1,000 mg by mouth 2 (two) times daily with a meal.     . Multiple Vitamin (MULTIVITAMIN WITH MINERALS) TABS tablet Take 1 tablet by mouth daily.    Marland Kitchen NITROSTAT 0.4 MG SL tablet Take  0.4 mg by mouth every 5 (five) minutes as needed for chest pain.   0  . Polyvinyl Alcohol-Povidone (MURINE TEARS FOR DRY EYES OP) Apply 1 drop to eye 3 (three) times daily as needed (dry eyes).    . Pseudoeph-Doxylamine-DM-APAP (NYQUIL PO) Take 1 Dose by mouth at bedtime.     . simvastatin (ZOCOR) 40 MG tablet Take 40 mg by mouth every evening.    Marland Kitchen SPIRIVA HANDIHALER 18 MCG inhalation capsule Place 18 mcg into inhaler and inhale daily.     . traMADol (ULTRAM) 50 MG tablet Take 50 mg by mouth 3 (three) times daily as needed for severe pain.     . vitamin B-12 (CYANOCOBALAMIN) 1000 MCG tablet Take 1,000 mcg by mouth daily as needed (tingling).     No current facility-administered medications for this visit.     REVIEW OF SYSTEMS:   [X]  denotes positive finding, [ ]  denotes negative finding Cardiac  Comments:  Chest pain or chest pressure:    Shortness of breath upon exertion:    Short of breath when lying flat:    Irregular heart rhythm:        Vascular    Pain in calf, thigh, or hip brought on by ambulation:    Pain in feet at night that wakes you up from your sleep:     Blood clot in your veins:    Leg swelling:         Pulmonary    Oxygen at home:    Productive cough:     Wheezing:         Neurologic    Sudden weakness in arms or legs:     Sudden numbness in arms or legs:     Sudden onset of difficulty speaking or slurred speech:    Temporary loss of vision in one eye:     Problems with dizziness:         Gastrointestinal    Blood in stool:     Vomited blood:         Genitourinary    Burning when urinating:     Blood in urine:        Psychiatric    Major depression:         Hematologic    Bleeding problems:    Problems with blood clotting too easily:        Skin    Rashes or ulcers: x       Constitutional  Fever or chills:      PHYSICAL EXAM:   Vitals:   08/30/17 1606  BP: (!) 164/87  Pulse: 96  Resp: 20  Temp: (!) 97.1 F (36.2 C)  TempSrc:  Oral  SpO2: 99%  Weight: 130 lb (59 kg)  Height: 5\' 1"  (1.549 m)    GENERAL: The patient is a well-nourished male, in no acute distress. The vital signs are documented above. CARDIAC: There is a regular rate and rhythm.  VASCULAR: Nonpalpable pedal pulse bilateral PULMONARY: Non-labored respirations MUSCULOSKELETAL: There are no major deformities or cyanosis. NEUROLOGIC: No focal weakness or paresthesias are detected. SKIN: 2 ulcers PSYCHIATRIC: The patient has a normal affect.  STUDIES:   I have reviewed his ultrasound which shows short segment occlusion of the right superficial femoral artery stent.  MEDICAL ISSUES:   I discussed with the patient that he has had limited durability of percutaneous interventions.  I think he needs to undergo a repeat arteriogram via a left femoral access to define his anatomy and determine what his options are for lower extremity bypass.  If there is a way to preserve the duration of his stents I will try that at this time, however I suspect he is getting the surgical revascularization.  His procedure is been scheduled for Tuesday, January 15    Durene CalWells Yaneli Keithley, MD [facility] Tel [phone] Pager [phone]) [phone]

## 2017-09-02 DIAGNOSIS — Z4800 Encounter for change or removal of nonsurgical wound dressing: Secondary | ICD-10-CM | POA: Diagnosis not present

## 2017-09-02 DIAGNOSIS — L97312 Non-pressure chronic ulcer of right ankle with fat layer exposed: Secondary | ICD-10-CM | POA: Diagnosis not present

## 2017-09-02 DIAGNOSIS — E1151 Type 2 diabetes mellitus with diabetic peripheral angiopathy without gangrene: Secondary | ICD-10-CM | POA: Diagnosis not present

## 2017-09-02 DIAGNOSIS — Z7984 Long term (current) use of oral hypoglycemic drugs: Secondary | ICD-10-CM | POA: Diagnosis not present

## 2017-09-02 DIAGNOSIS — E11622 Type 2 diabetes mellitus with other skin ulcer: Secondary | ICD-10-CM | POA: Diagnosis not present

## 2017-09-02 DIAGNOSIS — L97812 Non-pressure chronic ulcer of other part of right lower leg with fat layer exposed: Secondary | ICD-10-CM | POA: Diagnosis not present

## 2017-09-07 ENCOUNTER — Ambulatory Visit (HOSPITAL_COMMUNITY)
Admission: RE | Admit: 2017-09-07 | Discharge: 2017-09-07 | Disposition: A | Discharge status: Home / Self Care | Payer: Medicare Other | Source: Ambulatory Visit | Attending: Surgery | Admitting: Surgery

## 2017-09-07 ENCOUNTER — Encounter (HOSPITAL_COMMUNITY)
Admission: RE | Disposition: A | Discharge status: Home / Self Care | Payer: Self-pay | Source: Ambulatory Visit | Attending: Surgery

## 2017-09-07 DIAGNOSIS — Z7984 Long term (current) use of oral hypoglycemic drugs: Secondary | ICD-10-CM | POA: Diagnosis not present

## 2017-09-07 DIAGNOSIS — J439 Emphysema, unspecified: Secondary | ICD-10-CM | POA: Diagnosis not present

## 2017-09-07 DIAGNOSIS — L03115 Cellulitis of right lower limb: Secondary | ICD-10-CM | POA: Diagnosis not present

## 2017-09-07 DIAGNOSIS — I70239 Atherosclerosis of native arteries of right leg with ulceration of unspecified site: Secondary | ICD-10-CM | POA: Diagnosis not present

## 2017-09-07 DIAGNOSIS — F17220 Nicotine dependence, chewing tobacco, uncomplicated: Secondary | ICD-10-CM | POA: Diagnosis not present

## 2017-09-07 DIAGNOSIS — Z7902 Long term (current) use of antithrombotics/antiplatelets: Secondary | ICD-10-CM | POA: Diagnosis not present

## 2017-09-07 DIAGNOSIS — E1151 Type 2 diabetes mellitus with diabetic peripheral angiopathy without gangrene: Secondary | ICD-10-CM | POA: Diagnosis not present

## 2017-09-07 DIAGNOSIS — Z951 Presence of aortocoronary bypass graft: Secondary | ICD-10-CM | POA: Diagnosis not present

## 2017-09-07 DIAGNOSIS — L98499 Non-pressure chronic ulcer of skin of other sites with unspecified severity: Secondary | ICD-10-CM | POA: Insufficient documentation

## 2017-09-07 DIAGNOSIS — I2510 Atherosclerotic heart disease of native coronary artery without angina pectoris: Secondary | ICD-10-CM | POA: Insufficient documentation

## 2017-09-07 DIAGNOSIS — I10 Essential (primary) hypertension: Secondary | ICD-10-CM | POA: Diagnosis not present

## 2017-09-07 DIAGNOSIS — Z7982 Long term (current) use of aspirin: Secondary | ICD-10-CM | POA: Insufficient documentation

## 2017-09-07 DIAGNOSIS — Z79891 Long term (current) use of opiate analgesic: Secondary | ICD-10-CM | POA: Insufficient documentation

## 2017-09-07 DIAGNOSIS — Z8249 Family history of ischemic heart disease and other diseases of the circulatory system: Secondary | ICD-10-CM | POA: Insufficient documentation

## 2017-09-07 DIAGNOSIS — E11622 Type 2 diabetes mellitus with other skin ulcer: Secondary | ICD-10-CM | POA: Diagnosis present

## 2017-09-07 DIAGNOSIS — Z79899 Other long term (current) drug therapy: Secondary | ICD-10-CM | POA: Insufficient documentation

## 2017-09-07 DIAGNOSIS — Z791 Long term (current) use of non-steroidal anti-inflammatories (NSAID): Secondary | ICD-10-CM | POA: Diagnosis not present

## 2017-09-07 HISTORY — PX: ABDOMINAL AORTOGRAM W/LOWER EXTREMITY: CATH118223

## 2017-09-07 LAB — GLUCOSE, CAPILLARY
GLUCOSE-CAPILLARY: 101 mg/dL — AB (ref 65–99)
GLUCOSE-CAPILLARY: 75 mg/dL (ref 65–99)

## 2017-09-07 LAB — POCT I-STAT, CHEM 8
BUN: 16 mg/dL (ref 6–20)
CALCIUM ION: 1.27 mmol/L (ref 1.15–1.40)
CHLORIDE: 104 mmol/L (ref 101–111)
Creatinine, Ser: 0.7 mg/dL (ref 0.61–1.24)
GLUCOSE: 159 mg/dL — AB (ref 65–99)
HCT: 31 % — ABNORMAL LOW (ref 39.0–52.0)
Hemoglobin: 10.5 g/dL — ABNORMAL LOW (ref 13.0–17.0)
Potassium: 4.6 mmol/L (ref 3.5–5.1)
Sodium: 135 mmol/L (ref 135–145)
TCO2: 22 mmol/L (ref 22–32)

## 2017-09-07 SURGERY — ABDOMINAL AORTOGRAM W/LOWER EXTREMITY
Anesthesia: LOCAL

## 2017-09-07 MED ORDER — HEPARIN (PORCINE) IN NACL 2-0.9 UNIT/ML-% IJ SOLN
INTRAMUSCULAR | Status: AC
  Filled 2017-09-07: qty 1000

## 2017-09-07 MED ORDER — FENTANYL CITRATE (PF) 100 MCG/2ML IJ SOLN
INTRAMUSCULAR | Status: AC
  Filled 2017-09-07: qty 2

## 2017-09-07 MED ORDER — MIDAZOLAM HCL 2 MG/2ML IJ SOLN
INTRAMUSCULAR | Status: DC | PRN
  Administered 2017-09-07 (×2): 1 mg via INTRAVENOUS

## 2017-09-07 MED ORDER — HEPARIN (PORCINE) IN NACL 2-0.9 UNIT/ML-% IJ SOLN
INTRAMUSCULAR | Status: AC | PRN
  Administered 2017-09-07: 1000 mL via INTRA_ARTERIAL

## 2017-09-07 MED ORDER — MIDAZOLAM HCL 2 MG/2ML IJ SOLN
INTRAMUSCULAR | Status: AC
  Filled 2017-09-07: qty 2

## 2017-09-07 MED ORDER — SODIUM CHLORIDE 0.9 % IV SOLN
INTRAVENOUS | Status: DC
  Administered 2017-09-07: 11:00:00 via INTRAVENOUS

## 2017-09-07 MED ORDER — HYDRALAZINE HCL 20 MG/ML IJ SOLN: 5.0000 mg | INTRAMUSCULAR | Status: DC | PRN

## 2017-09-07 MED ORDER — MORPHINE SULFATE (PF) 10 MG/ML IV SOLN: 2.0000 mg | INTRAVENOUS | Status: DC | PRN

## 2017-09-07 MED ORDER — SODIUM CHLORIDE 0.9% FLUSH: 3.0000 mL | INTRAVENOUS | Status: DC | PRN

## 2017-09-07 MED ORDER — LIDOCAINE HCL (PF) 1 % IJ SOLN
INTRAMUSCULAR | Status: DC | PRN
  Administered 2017-09-07: 20 mL

## 2017-09-07 MED ORDER — FENTANYL CITRATE (PF) 100 MCG/2ML IJ SOLN
INTRAMUSCULAR | Status: DC | PRN
  Administered 2017-09-07 (×2): 25 ug via INTRAVENOUS

## 2017-09-07 MED ORDER — SODIUM CHLORIDE 0.9 % WEIGHT BASED INFUSION: 1.0000 mL/kg/h | INTRAVENOUS | Status: DC

## 2017-09-07 MED ORDER — SODIUM CHLORIDE 0.9 % IV SOLN: 250.0000 mL | INTRAVENOUS | Status: DC | PRN

## 2017-09-07 MED ORDER — LIDOCAINE HCL (PF) 1 % IJ SOLN
INTRAMUSCULAR | Status: AC
  Filled 2017-09-07: qty 30

## 2017-09-07 MED ORDER — SODIUM CHLORIDE 0.9% FLUSH: 3.0000 mL | Freq: Two times a day (BID) | INTRAVENOUS | Status: DC

## 2017-09-07 MED ORDER — ASPIRIN EC 325 MG PO TBEC
325.0000 mg | DELAYED_RELEASE_TABLET | Freq: Every day | ORAL | Status: DC
  Administered 2017-09-07: 325 mg via ORAL
  Filled 2017-09-07: qty 1

## 2017-09-07 MED ORDER — IODIXANOL 320 MG/ML IV SOLN
INTRAVENOUS | Status: DC | PRN
  Administered 2017-09-07: 120 mL via INTRA_ARTERIAL

## 2017-09-07 MED ORDER — OXYCODONE HCL 5 MG PO TABS: 5.0000 mg | ORAL_TABLET | ORAL | Status: DC | PRN

## 2017-09-07 MED ORDER — LABETALOL HCL 5 MG/ML IV SOLN: 10.0000 mg | INTRAVENOUS | Status: DC | PRN

## 2017-09-07 SURGICAL SUPPLY — 10 items
CATH OMNI FLUSH 5F 65CM (CATHETERS) ×1 IMPLANT
COVER PRB 48X5XTLSCP FOLD TPE (BAG) IMPLANT
COVER PROBE 5X48 (BAG) ×2
KIT MICROINTRODUCER STIFF 5F (SHEATH) ×2 IMPLANT
KIT PV (KITS) ×2 IMPLANT
SHEATH PINNACLE 5F 10CM (SHEATH) ×2 IMPLANT
SYR MEDRAD MARK V 150ML (SYRINGE) ×2 IMPLANT
TRANSDUCER W/STOPCOCK (MISCELLANEOUS) ×2 IMPLANT
TRAY PV CATH (CUSTOM PROCEDURE TRAY) ×2 IMPLANT
WIRE BENTSON .035X145CM (WIRE) ×2 IMPLANT

## 2017-09-07 NOTE — Discharge Instructions (Signed)
NO METFORMIN/GLUCOPHAGE FOR 2 DAYS ° ° ° °Femoral Site Care °Refer to this sheet in the next few weeks. These instructions provide you with information about caring for yourself after your procedure. Your health care provider may also give you more specific instructions. Your treatment has been planned according to current medical practices, but problems sometimes occur. Call your health care provider if you have any problems or questions after your procedure. °What can I expect after the procedure? °After your procedure, it is typical to have the following: °· Bruising at the site that usually fades within 1-2 weeks. °· Blood collecting in the tissue (hematoma) that may be painful to the touch. It should usually decrease in size and tenderness within 1-2 weeks. ° °Follow these instructions at home: °· Take medicines only as directed by your health care provider. °· You may shower 24-48 hours after the procedure or as directed by your health care provider. Remove the bandage (dressing) and gently wash the site with plain soap and water. Pat the area dry with a clean towel. Do not rub the site, because this may cause bleeding. °· Do not take baths, swim, or use a hot tub until your health care provider approves. °· Check your insertion site every day for redness, swelling, or drainage. °· Do not apply powder or lotion to the site. °· Limit use of stairs to twice a day for the first 2-3 days or as directed by your health care provider. °· Do not squat for the first 2-3 days or as directed by your health care provider. °· Do not lift over 10 lb (4.5 kg) for 5 days after your procedure or as directed by your health care provider. °· Ask your health care provider when it is okay to: °? Return to work or school. °? Resume usual physical activities or sports. °? Resume sexual activity. °· Do not drive home if you are discharged the same day as the procedure. Have someone else drive you. °· You may drive 24 hours after the  procedure unless otherwise instructed by your health care provider. °· Do not operate machinery or power tools for 24 hours after the procedure or as directed by your health care provider. °· If your procedure was done as an outpatient procedure, which means that you went home the same day as your procedure, a responsible adult should be with you for the first 24 hours after you arrive home. °· Keep all follow-up visits as directed by your health care provider. This is important. °Contact a health care provider if: °· You have a fever. °· You have chills. °· You have increased bleeding from the site. Hold pressure on the site. °Get help right away if: °· You have unusual pain at the site. °· You have redness, warmth, or swelling at the site. °· You have drainage (other than a small amount of blood on the dressing) from the site. °· The site is bleeding, and the bleeding does not stop after 30 minutes of holding steady pressure on the site. °· Your leg or foot becomes pale, cool, tingly, or numb. °This information is not intended to replace advice given to you by your health care provider. Make sure you discuss any questions you have with your health care provider. °Document Released: 04/13/2014 Document Revised: 01/16/2016 Document Reviewed: 02/27/2014 °Elsevier Interactive Patient Education © 2018 [employer] °Moderate Conscious Sedation, Adult, Care After °These instructions provide you with information about caring for yourself after your procedure. Your   health care provider may also give you more specific instructions. Your treatment has been planned according to current medical practices, but problems sometimes occur. Call your health care provider if you have any problems or questions after your procedure. °What can I expect after the procedure? °After your procedure, it is common: °· To feel sleepy for several hours. °· To feel clumsy and have poor balance for several hours. °· To have poor judgment for  several hours. °· To vomit if you eat too soon. ° °Follow these instructions at home: °For at least 24 hours after the procedure: ° °· Do not: °? Participate in activities where you could fall or become injured. °? Drive. °? Use heavy machinery. °? Drink alcohol. °? Take sleeping pills or medicines that cause drowsiness. °? Make important decisions or sign legal documents. °? Take care of children on your own. °· Rest. °Eating and drinking °· Follow the diet recommended by your health care provider. °· If you vomit: °? Drink water, juice, or soup when you can drink without vomiting. °? Make sure you have little or no nausea before eating solid foods. °General instructions °· Have a responsible adult stay with you until you are awake and alert. °· Take over-the-counter and prescription medicines only as told by your health care provider. °· If you smoke, do not smoke without supervision. °· Keep all follow-up visits as told by your health care provider. This is important. °Contact a health care provider if: °· You keep feeling nauseous or you keep vomiting. °· You feel light-headed. °· You develop a rash. °· You have a fever. °Get help right away if: °· You have trouble breathing. °This information is not intended to replace advice given to you by your health care provider. Make sure you discuss any questions you have with your health care provider. °Document Released: 05/31/2013 Document Revised: 01/13/2016 Document Reviewed: 11/30/2015 °Elsevier Interactive Patient Education © 2018 [employer] ° °

## 2017-09-07 NOTE — Op Note (Signed)
    Patient name: Reginald GalaWilliam H Ridling MRN: [id number] DOB: Sep 19, 1950 Sex: male  09/07/2017 Pre-operative Diagnosis: Right leg ulcer Post-operative diagnosis:  Same Surgeon:  Durene CalWells Jes Costales Procedure Performed:  1.  Ultrasound-guided access, left femoral artery  2.  Abdominal aortogram  3.  Second-order catheterization  4.  Bilateral lower extremity runoff  5.  Conscious sedation (35 minutes)    Indications: The patient has previously undergone percutaneous stenting.  His most recent ultrasound suggested stent occlusion.  He is here today for further evaluation.  Procedure:  The patient was identified in the holding area and taken to room 8.  The patient was then placed supine on the table and prepped and draped in the usual sterile fashion.  A time out was called.  Conscious sedation was administered with the use of IV fentanyl and Versed under continuous physician and nurse monitoring.  Heart rate, blood pressure, and oxygen saturations were continuously monitored.  Ultrasound was used to evaluate the left common femoral artery.  It was patent .  A digital ultrasound image was acquired.  A micropuncture needle was used to access the left common femoral artery under ultrasound guidance.  An 018 wire was advanced without resistance and a micropuncture sheath was placed.  The 018 wire was removed and a benson wire was placed.  The micropuncture sheath was exchanged for a 5 french sheath.  An omniflush catheter was advanced over the wire to the level of L-1.  An abdominal angiogram was obtained.  Next, using the omniflush catheter and a benson wire, the aortic bifurcation was crossed and the catheter was placed into theright external iliac artery and right runoff was obtained.  left runoff was performed via retrograde sheath injections.  Findings:   Aortogram: No significant renal artery stenosis.  The infrarenal abdominal aorta is patent throughout its course.  Bilateral common and external iliac arteries  are patent.  Right Lower Extremity: The right common femoral and profunda femoral artery are patent throughout their course.  The stent within the right superficial femoral and popliteal artery is widely patent with no evidence of stenosis or occlusion.  There is two-vessel runoff via the peroneal and posterior tibial artery.  Left Lower Extremity: The left common femoral and profunda femoral artery are patent throughout their course.  The superficial femoral artery is occluded at its origin with reconstitution of a diseased above-knee popliteal artery.  There is two-vessel runoff via the posterior tibial peroneal  Intervention: None  Impression:  #1 patent right superficial femoral artery stent  #2 flush occlusion of left superficial femoral artery  V. Durene CalWells Rilie Glanz, M.D. [facility] Office: [phone] Pager:  [phone]

## 2017-09-07 NOTE — Progress Notes (Signed)
Site area: Left groin a 5 french arterial sheath was removed  Site Prior to Removal:  Level 0  Pressure Applied For 25 MINUTES    Bedrest Beginning at 1630p  Manual:   Yes.    Patient Status During Pull:  stable  Post Pull Groin Site:  Level 0  Post Pull Instructions Given:  Yes.    Post Pull Pulses Present:  Yes.    Dressing Applied:  Yes.    Comments:  VS remain stable

## 2017-09-07 NOTE — Progress Notes (Signed)
Pt reports he does not have a driver home, he plans to take a cab home.  Pt instructed per policy he can not take cab home and would have to be admitted.  He states "I can not stay overnight"  Dr Myra GianottiBrabham notified and will come talk to pt

## 2017-09-07 NOTE — Interval H&P Note (Signed)
History and Physical Interval Note:  09/07/2017 2:08 PM  Reginald Ayers  has presented today for surgery, with the diagnosis of pvd w/ulcer right lower extremity  The various methods of treatment have been discussed with the patient and family. After consideration of risks, benefits and other options for treatment, the patient has consented to  Procedure(s): ABDOMINAL AORTOGRAM W/LOWER EXTREMITY (N/A) as a surgical intervention .  The patient's history has been reviewed, patient examined, no change in status, stable for surgery.  I have reviewed the patient's chart and labs.  Questions were answered to the patient's satisfaction.     Durene CalWells Lelani Garnett

## 2017-09-08 ENCOUNTER — Telehealth: Payer: Self-pay | Admitting: Surgery

## 2017-09-08 ENCOUNTER — Encounter (HOSPITAL_COMMUNITY): Payer: Self-pay | Admitting: Surgery

## 2017-09-08 NOTE — Telephone Encounter (Signed)
-----   Message from Sharee PimpleMarilyn K McChesney, RN sent at 09/07/2017  4:37 PM EST ----- Regarding: 3 months   ----- Message ----- From: Nada LibmanBrabham, Vance W, MD Sent: 09/07/2017   3:36 PM To: [facility] Charge Pool  09-07-2017:  Surgeon:  Durene CalWells Brabham Procedure Performed:  1.  Ultrasound-guided access, left femoral artery  2.  Abdominal aortogram  3.  Second-order catheterization  4.  Bilateral lower extremity runoff  5.  Conscious sedation (35 minutes)   F/u suzanne 3 months with ABI and right leg duplex

## 2017-09-08 NOTE — Telephone Encounter (Signed)
Sched appt 12/21/17; labs at 1:00 and NP at 2:45. Mailed letter.

## 2017-09-09 DIAGNOSIS — E1151 Type 2 diabetes mellitus with diabetic peripheral angiopathy without gangrene: Secondary | ICD-10-CM | POA: Diagnosis not present

## 2017-09-09 DIAGNOSIS — L89322 Pressure ulcer of left buttock, stage 2: Secondary | ICD-10-CM | POA: Diagnosis not present

## 2017-09-09 DIAGNOSIS — L97512 Non-pressure chronic ulcer of other part of right foot with fat layer exposed: Secondary | ICD-10-CM | POA: Diagnosis not present

## 2017-09-09 DIAGNOSIS — E11621 Type 2 diabetes mellitus with foot ulcer: Secondary | ICD-10-CM | POA: Diagnosis not present

## 2017-09-09 DIAGNOSIS — L97812 Non-pressure chronic ulcer of other part of right lower leg with fat layer exposed: Secondary | ICD-10-CM | POA: Diagnosis not present

## 2017-09-09 DIAGNOSIS — E11622 Type 2 diabetes mellitus with other skin ulcer: Secondary | ICD-10-CM | POA: Diagnosis not present

## 2017-09-09 DIAGNOSIS — L89323 Pressure ulcer of left buttock, stage 3: Secondary | ICD-10-CM | POA: Diagnosis not present

## 2017-09-12 DIAGNOSIS — L97312 Non-pressure chronic ulcer of right ankle with fat layer exposed: Secondary | ICD-10-CM | POA: Diagnosis not present

## 2017-09-12 DIAGNOSIS — Z4800 Encounter for change or removal of nonsurgical wound dressing: Secondary | ICD-10-CM | POA: Diagnosis not present

## 2017-09-12 DIAGNOSIS — E11622 Type 2 diabetes mellitus with other skin ulcer: Secondary | ICD-10-CM | POA: Diagnosis not present

## 2017-09-12 DIAGNOSIS — Z7984 Long term (current) use of oral hypoglycemic drugs: Secondary | ICD-10-CM | POA: Diagnosis not present

## 2017-09-12 DIAGNOSIS — L97812 Non-pressure chronic ulcer of other part of right lower leg with fat layer exposed: Secondary | ICD-10-CM | POA: Diagnosis not present

## 2017-09-12 DIAGNOSIS — E1151 Type 2 diabetes mellitus with diabetic peripheral angiopathy without gangrene: Secondary | ICD-10-CM | POA: Diagnosis not present

## 2017-09-16 DIAGNOSIS — Z4800 Encounter for change or removal of nonsurgical wound dressing: Secondary | ICD-10-CM | POA: Diagnosis not present

## 2017-09-16 DIAGNOSIS — Z7984 Long term (current) use of oral hypoglycemic drugs: Secondary | ICD-10-CM | POA: Diagnosis not present

## 2017-09-16 DIAGNOSIS — E11622 Type 2 diabetes mellitus with other skin ulcer: Secondary | ICD-10-CM | POA: Diagnosis not present

## 2017-09-16 DIAGNOSIS — L97812 Non-pressure chronic ulcer of other part of right lower leg with fat layer exposed: Secondary | ICD-10-CM | POA: Diagnosis not present

## 2017-09-16 DIAGNOSIS — L97312 Non-pressure chronic ulcer of right ankle with fat layer exposed: Secondary | ICD-10-CM | POA: Diagnosis not present

## 2017-09-16 DIAGNOSIS — E1151 Type 2 diabetes mellitus with diabetic peripheral angiopathy without gangrene: Secondary | ICD-10-CM | POA: Diagnosis not present

## 2017-09-18 DIAGNOSIS — I10 Essential (primary) hypertension: Secondary | ICD-10-CM | POA: Diagnosis not present

## 2017-09-18 DIAGNOSIS — E1151 Type 2 diabetes mellitus with diabetic peripheral angiopathy without gangrene: Secondary | ICD-10-CM | POA: Diagnosis not present

## 2017-09-18 DIAGNOSIS — L97312 Non-pressure chronic ulcer of right ankle with fat layer exposed: Secondary | ICD-10-CM | POA: Diagnosis not present

## 2017-09-18 DIAGNOSIS — J439 Emphysema, unspecified: Secondary | ICD-10-CM | POA: Diagnosis not present

## 2017-09-18 DIAGNOSIS — L97812 Non-pressure chronic ulcer of other part of right lower leg with fat layer exposed: Secondary | ICD-10-CM | POA: Diagnosis not present

## 2017-09-18 DIAGNOSIS — E11622 Type 2 diabetes mellitus with other skin ulcer: Secondary | ICD-10-CM | POA: Diagnosis not present

## 2017-09-18 DIAGNOSIS — Z7984 Long term (current) use of oral hypoglycemic drugs: Secondary | ICD-10-CM | POA: Diagnosis not present

## 2017-09-18 DIAGNOSIS — Z951 Presence of aortocoronary bypass graft: Secondary | ICD-10-CM | POA: Diagnosis not present

## 2017-09-18 DIAGNOSIS — Z4800 Encounter for change or removal of nonsurgical wound dressing: Secondary | ICD-10-CM | POA: Diagnosis not present

## 2017-09-18 DIAGNOSIS — Z720 Tobacco use: Secondary | ICD-10-CM | POA: Diagnosis not present

## 2017-09-18 DIAGNOSIS — Z8701 Personal history of pneumonia (recurrent): Secondary | ICD-10-CM | POA: Diagnosis not present

## 2017-09-18 DIAGNOSIS — Z7902 Long term (current) use of antithrombotics/antiplatelets: Secondary | ICD-10-CM | POA: Diagnosis not present

## 2017-09-18 DIAGNOSIS — I2510 Atherosclerotic heart disease of native coronary artery without angina pectoris: Secondary | ICD-10-CM | POA: Diagnosis not present

## 2017-09-20 DIAGNOSIS — J439 Emphysema, unspecified: Secondary | ICD-10-CM | POA: Diagnosis not present

## 2017-09-20 DIAGNOSIS — L97812 Non-pressure chronic ulcer of other part of right lower leg with fat layer exposed: Secondary | ICD-10-CM | POA: Diagnosis not present

## 2017-09-20 DIAGNOSIS — I2510 Atherosclerotic heart disease of native coronary artery without angina pectoris: Secondary | ICD-10-CM | POA: Diagnosis not present

## 2017-09-20 DIAGNOSIS — E11622 Type 2 diabetes mellitus with other skin ulcer: Secondary | ICD-10-CM | POA: Diagnosis not present

## 2017-09-20 DIAGNOSIS — I10 Essential (primary) hypertension: Secondary | ICD-10-CM | POA: Diagnosis not present

## 2017-09-20 DIAGNOSIS — L97312 Non-pressure chronic ulcer of right ankle with fat layer exposed: Secondary | ICD-10-CM | POA: Diagnosis not present

## 2017-09-22 DIAGNOSIS — E11622 Type 2 diabetes mellitus with other skin ulcer: Secondary | ICD-10-CM | POA: Diagnosis not present

## 2017-09-22 DIAGNOSIS — L89322 Pressure ulcer of left buttock, stage 2: Secondary | ICD-10-CM | POA: Diagnosis not present

## 2017-09-22 DIAGNOSIS — L97512 Non-pressure chronic ulcer of other part of right foot with fat layer exposed: Secondary | ICD-10-CM | POA: Diagnosis not present

## 2017-09-22 DIAGNOSIS — E11621 Type 2 diabetes mellitus with foot ulcer: Secondary | ICD-10-CM | POA: Diagnosis not present

## 2017-09-22 DIAGNOSIS — L97812 Non-pressure chronic ulcer of other part of right lower leg with fat layer exposed: Secondary | ICD-10-CM | POA: Diagnosis not present

## 2017-09-22 DIAGNOSIS — E1151 Type 2 diabetes mellitus with diabetic peripheral angiopathy without gangrene: Secondary | ICD-10-CM | POA: Diagnosis not present

## 2017-09-23 DIAGNOSIS — E1151 Type 2 diabetes mellitus with diabetic peripheral angiopathy without gangrene: Secondary | ICD-10-CM | POA: Diagnosis not present

## 2017-09-23 DIAGNOSIS — E11621 Type 2 diabetes mellitus with foot ulcer: Secondary | ICD-10-CM | POA: Diagnosis not present

## 2017-09-23 DIAGNOSIS — L89322 Pressure ulcer of left buttock, stage 2: Secondary | ICD-10-CM | POA: Diagnosis not present

## 2017-09-23 DIAGNOSIS — L97812 Non-pressure chronic ulcer of other part of right lower leg with fat layer exposed: Secondary | ICD-10-CM | POA: Diagnosis not present

## 2017-09-23 DIAGNOSIS — L89323 Pressure ulcer of left buttock, stage 3: Secondary | ICD-10-CM | POA: Diagnosis not present

## 2017-09-23 DIAGNOSIS — E11622 Type 2 diabetes mellitus with other skin ulcer: Secondary | ICD-10-CM | POA: Diagnosis not present

## 2017-09-23 DIAGNOSIS — L97512 Non-pressure chronic ulcer of other part of right foot with fat layer exposed: Secondary | ICD-10-CM | POA: Diagnosis not present

## 2017-09-25 ENCOUNTER — Emergency Department (HOSPITAL_COMMUNITY): Payer: Medicare Other

## 2017-09-25 ENCOUNTER — Encounter (HOSPITAL_COMMUNITY): Payer: Self-pay

## 2017-09-25 ENCOUNTER — Emergency Department (HOSPITAL_COMMUNITY)
Admission: EM | Admit: 2017-09-25 | Discharge: 2017-09-25 | Disposition: A | Discharge status: Home / Self Care | Payer: Medicare Other | Attending: Emergency Medicine | Admitting: Emergency Medicine

## 2017-09-25 ENCOUNTER — Other Ambulatory Visit: Payer: Self-pay

## 2017-09-25 DIAGNOSIS — I2510 Atherosclerotic heart disease of native coronary artery without angina pectoris: Secondary | ICD-10-CM | POA: Diagnosis not present

## 2017-09-25 DIAGNOSIS — F17210 Nicotine dependence, cigarettes, uncomplicated: Secondary | ICD-10-CM | POA: Diagnosis not present

## 2017-09-25 DIAGNOSIS — J449 Chronic obstructive pulmonary disease, unspecified: Secondary | ICD-10-CM | POA: Insufficient documentation

## 2017-09-25 DIAGNOSIS — Z951 Presence of aortocoronary bypass graft: Secondary | ICD-10-CM | POA: Diagnosis not present

## 2017-09-25 DIAGNOSIS — Z79899 Other long term (current) drug therapy: Secondary | ICD-10-CM | POA: Insufficient documentation

## 2017-09-25 DIAGNOSIS — M6281 Muscle weakness (generalized): Secondary | ICD-10-CM | POA: Diagnosis present

## 2017-09-25 DIAGNOSIS — E162 Hypoglycemia, unspecified: Secondary | ICD-10-CM

## 2017-09-25 DIAGNOSIS — J9811 Atelectasis: Secondary | ICD-10-CM | POA: Diagnosis not present

## 2017-09-25 DIAGNOSIS — E11649 Type 2 diabetes mellitus with hypoglycemia without coma: Secondary | ICD-10-CM | POA: Insufficient documentation

## 2017-09-25 DIAGNOSIS — I10 Essential (primary) hypertension: Secondary | ICD-10-CM | POA: Insufficient documentation

## 2017-09-25 DIAGNOSIS — I6789 Other cerebrovascular disease: Secondary | ICD-10-CM | POA: Diagnosis not present

## 2017-09-25 DIAGNOSIS — Z7902 Long term (current) use of antithrombotics/antiplatelets: Secondary | ICD-10-CM | POA: Diagnosis not present

## 2017-09-25 DIAGNOSIS — Z7984 Long term (current) use of oral hypoglycemic drugs: Secondary | ICD-10-CM | POA: Insufficient documentation

## 2017-09-25 DIAGNOSIS — R531 Weakness: Secondary | ICD-10-CM | POA: Diagnosis not present

## 2017-09-25 LAB — COMPREHENSIVE METABOLIC PANEL
ALT: 16 U/L — ABNORMAL LOW (ref 17–63)
ANION GAP: 12 (ref 5–15)
AST: 33 U/L (ref 15–41)
Albumin: 3.6 g/dL (ref 3.5–5.0)
Alkaline Phosphatase: 55 U/L (ref 38–126)
BUN: 15 mg/dL (ref 6–20)
CHLORIDE: 95 mmol/L — AB (ref 101–111)
CO2: 20 mmol/L — ABNORMAL LOW (ref 22–32)
Calcium: 8.6 mg/dL — ABNORMAL LOW (ref 8.9–10.3)
Creatinine, Ser: 0.98 mg/dL (ref 0.61–1.24)
Glucose, Bld: 194 mg/dL — ABNORMAL HIGH (ref 65–99)
POTASSIUM: 4.2 mmol/L (ref 3.5–5.1)
Sodium: 127 mmol/L — ABNORMAL LOW (ref 135–145)
Total Bilirubin: 0.9 mg/dL (ref 0.3–1.2)
Total Protein: 6 g/dL — ABNORMAL LOW (ref 6.5–8.1)

## 2017-09-25 LAB — URINALYSIS, ROUTINE W REFLEX MICROSCOPIC
Bilirubin Urine: NEGATIVE
Glucose, UA: 150 mg/dL — AB
Hgb urine dipstick: NEGATIVE
KETONES UR: NEGATIVE mg/dL
Leukocytes, UA: NEGATIVE
Nitrite: NEGATIVE
PH: 6 (ref 5.0–8.0)
Protein, ur: NEGATIVE mg/dL
Specific Gravity, Urine: 1.01 (ref 1.005–1.030)

## 2017-09-25 LAB — CBC WITH DIFFERENTIAL/PLATELET
Basophils Absolute: 0 10*3/uL (ref 0.0–0.1)
Basophils Relative: 0 %
Eosinophils Absolute: 0 10*3/uL (ref 0.0–0.7)
Eosinophils Relative: 0 %
HCT: 29.8 % — ABNORMAL LOW (ref 39.0–52.0)
HEMOGLOBIN: 10.1 g/dL — AB (ref 13.0–17.0)
LYMPHS ABS: 1 10*3/uL (ref 0.7–4.0)
LYMPHS PCT: 12 %
MCH: 30.6 pg (ref 26.0–34.0)
MCHC: 33.9 g/dL (ref 30.0–36.0)
MCV: 90.3 fL (ref 78.0–100.0)
Monocytes Absolute: 1 10*3/uL (ref 0.1–1.0)
Monocytes Relative: 12 %
NEUTROS PCT: 76 %
Neutro Abs: 6.2 10*3/uL (ref 1.7–7.7)
Platelets: 223 10*3/uL (ref 150–400)
RBC: 3.3 MIL/uL — AB (ref 4.22–5.81)
RDW: 14.6 % (ref 11.5–15.5)
WBC: 8.2 10*3/uL (ref 4.0–10.5)

## 2017-09-25 LAB — CBG MONITORING, ED: GLUCOSE-CAPILLARY: 151 mg/dL — AB (ref 65–99)

## 2017-09-25 LAB — HEMOGLOBIN A1C
HEMOGLOBIN A1C: 6.7 % — AB (ref 4.8–5.6)
Mean Plasma Glucose: 145.59 mg/dL

## 2017-09-25 IMAGING — CR DG CHEST 2V
2 series · 2 of 2 positions shown · non-contrast
Comparison: [DATE], [DATE] and earlier, including screening
CT chest [DATE].

CLINICAL DATA: Hypoglycemia, responsive to glucose administration.

EXAM:
CHEST  2 VIEW

[chest lat]
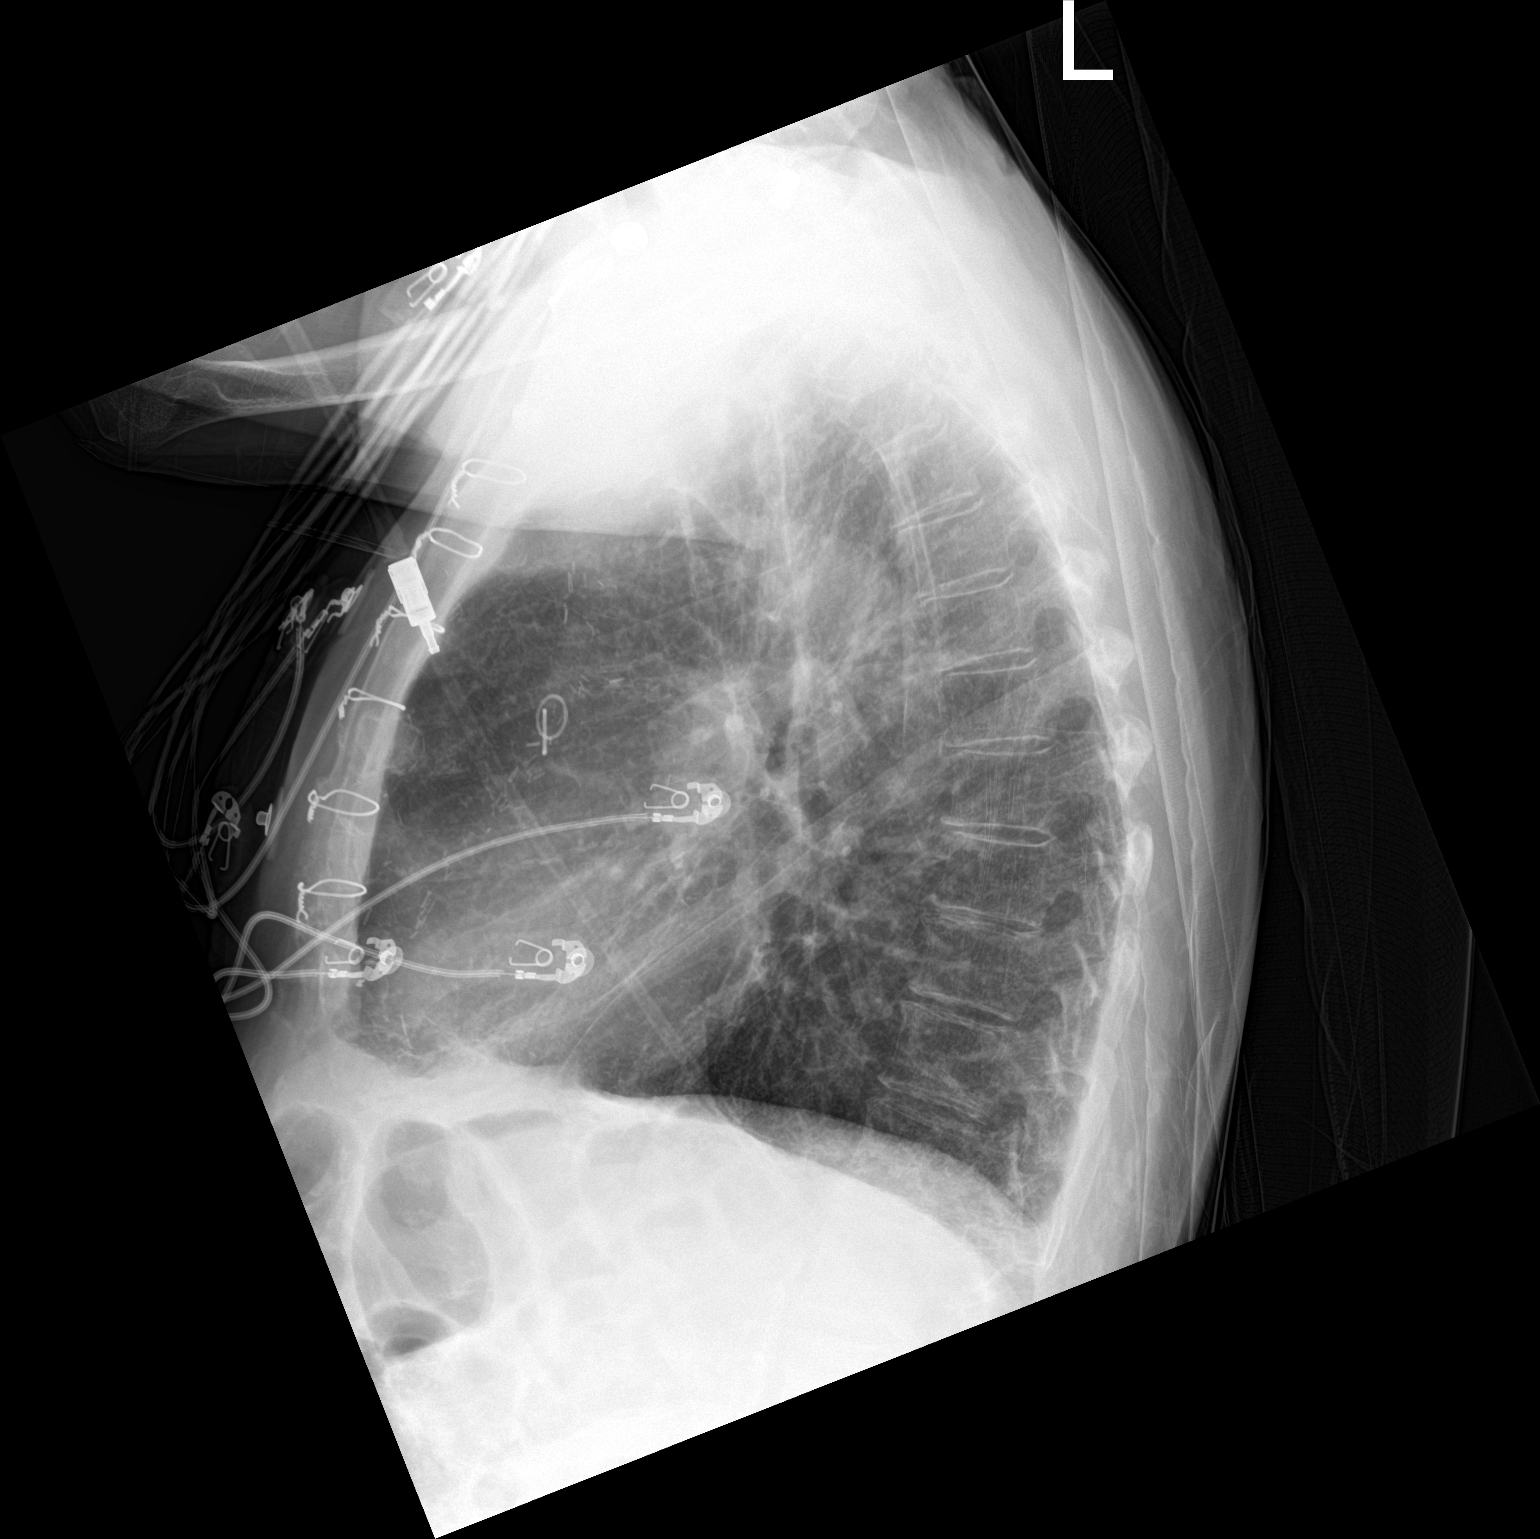

[chest ap]
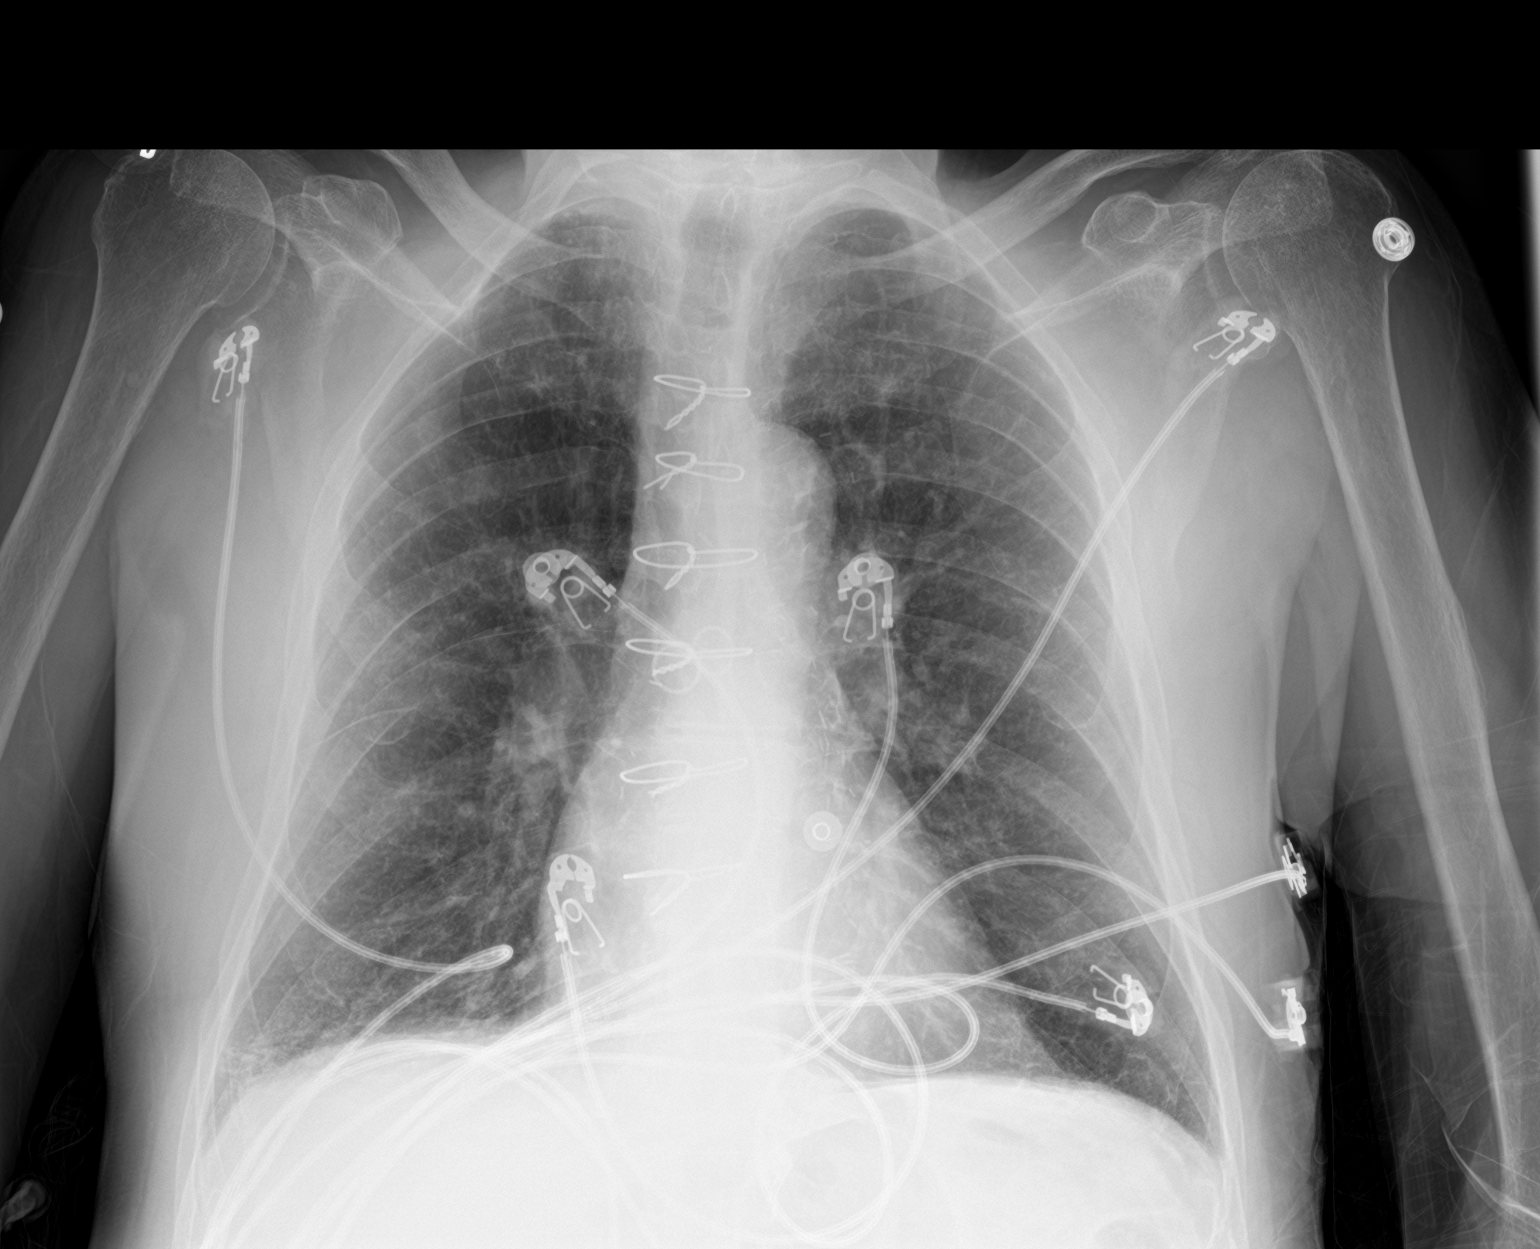

[2 of 2 positions shown; findings below may reference images not displayed]

FINDINGS: Prior sternotomy for CABG. Normal heart size. Thoracic aorta
atherosclerotic, unchanged. Hilar and mediastinal contours otherwise
unremarkable. Prominent bronchovascular markings diffusely, moderate
central peribronchial thickening and fibrosis involving the lower
lobes and right middle lobe, unchanged. Emphysematous changes
throughout both lungs with hyperinflation, unchanged. No new
pulmonary parenchymal abnormalities. No pleural effusions. Mild
degenerative changes involving the thoracic spine.
IMPRESSION: 1.  No acute cardiopulmonary disease.
2. Stable COPD/emphysema and interstitial fibrosis involving the
lower lobes and right middle lobe.

## 2017-09-25 NOTE — ED Notes (Signed)
Pt ambulated by BryantHolly, EMT. Pt ambulated just fine. Showing NAD. RR even and unlabored. Given a bag lunch & PO fluids. Tolerating all well.

## 2017-09-25 NOTE — ED Provider Notes (Signed)
[facility] EMERGENCY DEPARTMENT Provider Note   CSN: [id number] Arrival date & time: 09/25/17  7829     History   Chief Complaint Chief Complaint  Patient presents with  . Hypoglycemia    HPI Reginald Ayers is a 67 y.o. male.  Patient is a 67 year old male presenting with hypoglycemia.  PMH significant for CAD with CABG on Plavix, COPD, PAD with chronic RLL wound, NIDT2DM, HTN.  Patient experiencing sudden onset generalized weakness while at the computer earlier this afternoon around 5:30 PM.  Patient stated he had feelings of "passing out" and called for his roommate.  He denies LOC, change in vision, or fall.  Patient's roommate called EMS.  Upon arrival, CBG 42.  Patient received amp of dextrose along with orange juice with quick resolution.  Patient was brought to ED for evaluation of hypoglycemia.  Patient is currently on metformin 1000 mg twice daily and glipizide 10 mg twice daily.  He has been on these medications for many years without changes.  Patient denies fevers or chills, nausea or vomiting, diarrhea, chest pain or shortness of breath, polyuria or dysuria, rash or infection.  He denies sick contacts.  Patient eats 2 meals regularly with his last meal around 11 AM this morning.  Patient states this is a second time having a hypoglycemic event.  His prior event was following a visit to his PCP while getting out of the car.  He subsequently experienced difficulty seeing and generalized weakness which resolved.  He states this current episode felt similar without change to vision.  Patient lives at home with 2 roommates and ambulates without need for assistance.  He does not drive.      Past Medical History:  Diagnosis Date  . Carotid artery disease (HCC)    L-ICA 100%, mod R-ICA dz  . COPD (chronic obstructive pulmonary disease) (HCC)   . Coronary artery disease    s/p CABG February 2010 by Dr. Andrey Spearman  . Diabetes (HCC)   . Emphysema lung  (HCC)   . Hyperlipidemia   . Hypertension   . Peripheral arterial disease (HCC)    post left common iliac and right SFA stenting remotely  . Tobacco abuse         Patient Active Problem List   Diagnosis Date Noted  . Sepsis (HCC) 08/12/2017  . Diabetes mellitus type 2 in nonobese (HCC) 08/12/2017  . CAP (community acquired pneumonia) 08/12/2017  . Critical lower limb ischemia 04/08/2016  . GERD (gastroesophageal reflux disease) 07/30/2015  . Chest pain 12/06/2014  . Tobacco abuse 12/06/2014  . Hyponatremia 12/06/2014  . COPD exacerbation (HCC)   . Pain in the chest   . Hx of CABG Feb 2010 08/21/2013  . Peripheral arterial disease (HCC) 08/21/2013  . Carotid artery disease (HCC) 08/21/2013  . Diabetes (HCC) 08/21/2013  . Essential hypertension 08/21/2013  . Hyperlipidemia 08/21/2013    Past Surgical History:  Procedure Laterality Date  . ABDOMINAL AORTOGRAM N/A 04/20/2017   Procedure: ABDOMINAL AORTOGRAM;  Surgeon: Nada Libman, MD;  Location: [facility] INVASIVE CV [facility];  Service: Cardiovascular;  Laterality: N/A;  . ABDOMINAL AORTOGRAM W/LOWER EXTREMITY Right 12/08/2016   Procedure: Abdominal Aortogram w/Lower Extremity;  Surgeon: Nada Libman, MD;  Location: [facility];  Service: Cardiovascular;  Laterality: Right;  . ABDOMINAL AORTOGRAM W/LOWER EXTREMITY N/A 09/07/2017   Procedure: ABDOMINAL AORTOGRAM W/LOWER EXTREMITY;  Surgeon: Nada Libman, MD;  Location: [facility];  Service: Cardiovascular;  Laterality: N/A;  . CARDIAC CATHETERIZATION  2010  . CAROTID ANGIOGRAM  2014  . CORONARY ARTERY BYPASS GRAFT  2010   LIMA-LAD, Lrad-OM1, SVG-RI, SVG-AM-dRCA  . DOPPLER ECHOCARDIOGRAPHY  2010  . LOWER EXTREMITY ANGIOGRAM  11/2006   left common femoral endarterectomy and patch angioplasty:The mid right SFA was angioplastied with a 4 x 8  . LOWER EXTREMITY ANGIOGRAM  2003   left common iliac artery stenting by Dr. Erlene QuanJ. Berry  . LOWER EXTREMITY ANGIOGRAPHY Right  04/20/2017   Procedure: Lower Extremity Angiography;  Surgeon: Nada LibmanBrabham, Vance W, MD;  [facility];  Service: Cardiovascular;  Laterality: Right;  . NM MYOVIEW LTD  2015  . PERIPHERAL VASCULAR ATHERECTOMY Right 12/08/2016   Procedure: Peripheral Vascular Atherectomy;  Surgeon: Nada LibmanVance W Brabham, MD;  Location: [facility];  Service: Cardiovascular;  Laterality: Right;  . PERIPHERAL VASCULAR BALLOON ANGIOPLASTY Right 12/08/2016   Procedure: Peripheral Vascular Balloon Angioplasty;  Surgeon: Nada LibmanVance W Brabham, MD;  Location: [facility];  Service: Cardiovascular;  Laterality: Right;  . PERIPHERAL VASCULAR BALLOON ANGIOPLASTY Right 04/20/2017   Procedure: PERIPHERAL VASCULAR BALLOON ANGIOPLASTY;  Surgeon: Nada LibmanBrabham, Vance W, MD;  Location: [facility];  Service: Cardiovascular;  Laterality: Right;  SFA  . PERIPHERAL VASCULAR CATHETERIZATION N/A 09/22/2016   Procedure: Abdominal Aortogram w/ bilateral Lower Extremity Runoff;  Surgeon: Nada LibmanVance W Brabham, MD;  Location: [facility];  Service: Cardiovascular;  Laterality: N/A;  . PERIPHERAL VASCULAR CATHETERIZATION Right 09/22/2016   Procedure: Peripheral Vascular Intervention;  Surgeon: Nada LibmanVance W Brabham, MD;  Location: [facility];  Service: Cardiovascular;  Laterality: Right;       Home Medications    Prior to Admission medications   Medication Sig Start Date End Date Taking? Authorizing Provider  albuterol (PROVENTIL HFA;VENTOLIN HFA) 108 (90 Base) MCG/ACT inhaler Inhale 2 puffs into the lungs every 4 (four) hours as needed for wheezing or shortness of breath.     [provider]  albuterol (PROVENTIL) (2.5 MG/3ML) 0.083% nebulizer solution Inhale 3 mLs into the lungs every 3 (three) hours as needed for shortness of breath. 08/15/17   Maxie BarbBhandari, Dron Prasad, MD  B Complex Vitamins (B COMPLEX PO) Take 1 tablet by mouth 2 (two) times a week.     [provider]  Benzocaine (BOIL-EASE EX) Apply 1  application topically daily as needed (BOIL).    [provider]  clopidogrel (PLAVIX) 75 MG tablet Take 75 mg by mouth daily.  07/24/13   [provider]  Cyanocobalamin (VITAMIN B-12 IJ) Inject 1 Dose as directed every 30 (thirty) days.     [provider]  furosemide (LASIX) 40 MG tablet Take 40 mg by mouth daily as needed for edema.    [provider]  glipiZIDE (GLUCOTROL XL) 10 MG 24 hr tablet Take 10 mg by mouth 2 (two) times daily.  07/24/13   [provider]  Homeopathic Products (LEG CRAMP RELIEF SL) Place 2 tablets under the tongue at bedtime as needed (leg cramps).     [provider]  ibuprofen (ADVIL,MOTRIN) 200 MG tablet Take 800 mg by mouth every 8 (eight) hours as needed for moderate pain.    [provider]  irbesartan (AVAPRO) 300 MG tablet Take 300 mg by mouth daily.  07/24/13   [provider]  IRON PO Take 1 tablet by mouth 4 (four) times a week.     [provider]  metFORMIN (GLUCOPHAGE) 500 MG  tablet Take 1,000 mg by mouth 2 (two) times daily with a meal.  07/24/13   [provider]  NITROSTAT 0.4 MG SL tablet Take 0.4 mg by mouth every 5 (five) minutes as needed for chest pain.  12/18/14   [provider]  Polyvinyl Alcohol-Povidone (MURINE TEARS FOR DRY EYES OP) Apply 1 drop to eye 3 (three) times daily as needed (dry eyes).    [provider]  Pseudoeph-Doxylamine-DM-APAP (NYQUIL PO) Take 1 Dose by mouth at bedtime.     [provider]  simvastatin (ZOCOR) 40 MG tablet Take 40 mg by mouth every evening. 06/13/16   [provider]  SPIRIVA HANDIHALER 18 MCG inhalation capsule Place 18 mcg into inhaler and inhale daily at 8 pm.  08/16/13   [provider]  traMADol (ULTRAM) 50 MG tablet Take 50 mg by mouth 3 (three) times daily as needed for severe pain.  08/16/13   [provider]    Family History Family History  Problem Relation  Age of Onset  . Heart attack Mother   . CVA Mother   . Heart disease Mother   . Diabetes Sister   . Hypertension Sister     Social History Social History   Tobacco Use  . Smoking status: Light Tobacco Smoker    Packs/day: 0.25    Types: Cigarettes    Last attempt to quit: 08/07/2016    Years since quitting: 1.1  . Smokeless tobacco: Current User    Types: Chew  Substance Use Topics  . Alcohol use: No    Alcohol/week: 0.0 oz    Comment: Weekend drinker, quit 2000.   . Drug use: No     Allergies   Actos [pioglitazone] and Lisinopril   Review of Systems Review of Systems  Constitutional: Negative for chills and fever.  HENT: Negative for congestion and rhinorrhea.   Eyes: Negative for discharge and itching.  Respiratory: Negative for chest tightness, shortness of breath and wheezing.   Cardiovascular: Negative for chest pain, palpitations and leg swelling.  Gastrointestinal: Negative for diarrhea, nausea and vomiting.  Genitourinary: Negative for dysuria and frequency.  Musculoskeletal: Positive for arthralgias. Negative for back pain.  Skin: Negative for pallor and rash.  Neurological: Positive for dizziness and weakness. Negative for headaches.  Psychiatric/Behavioral: Negative for confusion. The patient is not nervous/anxious.      Physical Exam Updated Vital Signs BP (!) 162/74   Pulse (!) 106   Temp 98.2 F (36.8 C) (Oral)   Resp (!) 27   Ht 5\' 1"  (1.549 m)   Wt 58.1 kg (128 lb)   SpO2 98%   BMI 24.19 kg/m   Physical Exam  Constitutional: He is oriented to person, place, and time. He appears well-developed and well-nourished. No distress.  HENT:  Head: Normocephalic and atraumatic.  Eyes: EOM are normal. Pupils are equal, round, and reactive to light.  Neck: Normal range of motion. Neck supple.  Cardiovascular: Normal rate, regular rhythm, normal heart sounds and intact distal pulses. Exam reveals no gallop and no friction rub.  No murmur  heard. Pulmonary/Chest: Effort normal and breath sounds normal. He has no wheezes. He has no rales.  Abdominal: Soft. Bowel sounds are normal.  Musculoskeletal: Normal range of motion.  Neurological: He is alert and oriented to person, place, and time.  Skin: Skin is warm and dry. He is not diaphoretic.  Well-healed ulcer on right shin and dorsal aspect of right foot (see picture below)  Psychiatric: He  has a normal mood and affect. His behavior is normal. Thought content normal.         ED Treatments / Results  Labs (all labs ordered are listed, but only abnormal results are displayed) Labs Reviewed  COMPREHENSIVE METABOLIC PANEL - Abnormal; Notable for the following components:      Result Value   Sodium 127 (*)    Chloride 95 (*)    CO2 20 (*)    Glucose, Bld 194 (*)    Calcium 8.6 (*)    Total Protein 6.0 (*)    ALT 16 (*)    All other components within normal limits  CBC WITH DIFFERENTIAL/PLATELET - Abnormal; Notable for the following components:   RBC 3.30 (*)    Hemoglobin 10.1 (*)    HCT 29.8 (*)    All other components within normal limits  HEMOGLOBIN A1C - Abnormal; Notable for the following components:   Hgb A1c MFr Bld 6.7 (*)    All other components within normal limits  URINALYSIS, ROUTINE W REFLEX MICROSCOPIC - Abnormal; Notable for the following components:   Glucose, UA 150 (*)    All other components within normal limits  CBG MONITORING, ED - Abnormal; Notable for the following components:   Glucose-Capillary 151 (*)    All other components within normal limits    EKG  EKG Interpretation None       Radiology Dg Chest 2 View  Result Date: 09/25/2017 CLINICAL DATA:  Hypoglycemia, responsive to glucose administration. EXAM: CHEST  2 VIEW COMPARISON:  08/12/2017, 07/08/2015 and earlier, including screening CT chest 08/14/2016. FINDINGS: Prior sternotomy for CABG. Normal heart size. Thoracic aorta atherosclerotic, unchanged. Hilar and mediastinal  contours otherwise unremarkable. Prominent bronchovascular markings diffusely, moderate central peribronchial thickening and fibrosis involving the lower lobes and right middle lobe, unchanged. Emphysematous changes throughout both lungs with hyperinflation, unchanged. No new pulmonary parenchymal abnormalities. No pleural effusions. Mild degenerative changes involving the thoracic spine. IMPRESSION: 1.  No acute cardiopulmonary disease. 2. Stable COPD/emphysema and interstitial fibrosis involving the lower lobes and right middle lobe. Electronically Signed   By: Hulan Saas M.D.   On: 09/25/2017 21:09    Procedures Procedures (including critical care time)  Medications Ordered in ED Medications - No data to display   Initial Impression / Assessment and Plan / ED Course  I have reviewed the triage vital signs and the nursing notes.  Pertinent labs & imaging results that were available during my care of the patient were reviewed by me and considered in my medical decision making (see chart for details).    Patient is a 67 year old male presenting with hypoglycemia.  PMH significant for CAD with CABG on Plavix, COPD, PAD with chronic RLL wound, NIDT2DM, HTN.  Generalized weakness due to hypoglycemic event at 42.  Symptoms quickly resolved following amp of dextrose and orange juice upon EMS arriving.  CBG 151 on arrival.  Vital signs afebrile initially cardiac at 106 now normal sinus without increased work of breathing room air with systolics 150 and diastolic 60s.  Patient without symptoms or signs of infection.  Patient alert and oriented with clear lung sounds and no signs of infection with well-healed ulcers on right leg.  CBC in CM[facility] obtained significant for Na 127, CBG 194, Hgb 10.1.  Patient given diet and ambulated successfully without weakness.  Discuss with patient how to treat hypoglycemia and instructed to discontinue glipizide until addressed with PCP.  Reviewed return precautions.   Patient with understanding and  stable for discharge.  Final Clinical Impressions(s) / ED Diagnoses   Final diagnoses:  Hypoglycemia    ED Discharge Orders    None       Wendee Beavers, DO 09/25/17 2232    Blane Ohara, MD 09/26/17 [id number]

## 2017-09-25 NOTE — Discharge Instructions (Addendum)
Discontinue glipizide and discussed with your primary care doctor about whether or not to restart.  If you experience a recurrence of your symptoms, find something sugary such as juice, peanut butter, or cookie to help boost your blood sugar.

## 2017-09-25 NOTE — ED Triage Notes (Signed)
Pt from home with hypoglycemia of 42 with EMS, pt recieved 10g of D10 and one tube of oral glucose. Pt was slurred and altered pt now back to baseline

## 2017-09-27 DIAGNOSIS — J439 Emphysema, unspecified: Secondary | ICD-10-CM | POA: Diagnosis not present

## 2017-09-27 DIAGNOSIS — I2510 Atherosclerotic heart disease of native coronary artery without angina pectoris: Secondary | ICD-10-CM | POA: Diagnosis not present

## 2017-09-27 DIAGNOSIS — L97312 Non-pressure chronic ulcer of right ankle with fat layer exposed: Secondary | ICD-10-CM | POA: Diagnosis not present

## 2017-09-27 DIAGNOSIS — I10 Essential (primary) hypertension: Secondary | ICD-10-CM | POA: Diagnosis not present

## 2017-09-27 DIAGNOSIS — E11622 Type 2 diabetes mellitus with other skin ulcer: Secondary | ICD-10-CM | POA: Diagnosis not present

## 2017-09-27 DIAGNOSIS — L97812 Non-pressure chronic ulcer of other part of right lower leg with fat layer exposed: Secondary | ICD-10-CM | POA: Diagnosis not present

## 2017-09-30 DIAGNOSIS — E11622 Type 2 diabetes mellitus with other skin ulcer: Secondary | ICD-10-CM | POA: Diagnosis not present

## 2017-09-30 DIAGNOSIS — L97812 Non-pressure chronic ulcer of other part of right lower leg with fat layer exposed: Secondary | ICD-10-CM | POA: Diagnosis not present

## 2017-09-30 DIAGNOSIS — J439 Emphysema, unspecified: Secondary | ICD-10-CM | POA: Diagnosis not present

## 2017-09-30 DIAGNOSIS — L97312 Non-pressure chronic ulcer of right ankle with fat layer exposed: Secondary | ICD-10-CM | POA: Diagnosis not present

## 2017-09-30 DIAGNOSIS — I10 Essential (primary) hypertension: Secondary | ICD-10-CM | POA: Diagnosis not present

## 2017-09-30 DIAGNOSIS — I2510 Atherosclerotic heart disease of native coronary artery without angina pectoris: Secondary | ICD-10-CM | POA: Diagnosis not present

## 2017-10-04 DIAGNOSIS — L97812 Non-pressure chronic ulcer of other part of right lower leg with fat layer exposed: Secondary | ICD-10-CM | POA: Diagnosis not present

## 2017-10-04 DIAGNOSIS — E11622 Type 2 diabetes mellitus with other skin ulcer: Secondary | ICD-10-CM | POA: Diagnosis not present

## 2017-10-04 DIAGNOSIS — L97312 Non-pressure chronic ulcer of right ankle with fat layer exposed: Secondary | ICD-10-CM | POA: Diagnosis not present

## 2017-10-04 DIAGNOSIS — I10 Essential (primary) hypertension: Secondary | ICD-10-CM | POA: Diagnosis not present

## 2017-10-04 DIAGNOSIS — J439 Emphysema, unspecified: Secondary | ICD-10-CM | POA: Diagnosis not present

## 2017-10-04 DIAGNOSIS — I2510 Atherosclerotic heart disease of native coronary artery without angina pectoris: Secondary | ICD-10-CM | POA: Diagnosis not present

## 2017-10-05 DIAGNOSIS — Z7984 Long term (current) use of oral hypoglycemic drugs: Secondary | ICD-10-CM | POA: Diagnosis not present

## 2017-10-05 DIAGNOSIS — E162 Hypoglycemia, unspecified: Secondary | ICD-10-CM | POA: Diagnosis not present

## 2017-10-07 ENCOUNTER — Encounter (HOSPITAL_BASED_OUTPATIENT_CLINIC_OR_DEPARTMENT_OTHER): Payer: Medicare Other | Attending: Internal Medicine

## 2017-10-07 DIAGNOSIS — Z9221 Personal history of antineoplastic chemotherapy: Secondary | ICD-10-CM | POA: Insufficient documentation

## 2017-10-07 DIAGNOSIS — I10 Essential (primary) hypertension: Secondary | ICD-10-CM | POA: Diagnosis not present

## 2017-10-07 DIAGNOSIS — I2510 Atherosclerotic heart disease of native coronary artery without angina pectoris: Secondary | ICD-10-CM | POA: Diagnosis not present

## 2017-10-07 DIAGNOSIS — L97512 Non-pressure chronic ulcer of other part of right foot with fat layer exposed: Secondary | ICD-10-CM | POA: Diagnosis not present

## 2017-10-07 DIAGNOSIS — J449 Chronic obstructive pulmonary disease, unspecified: Secondary | ICD-10-CM | POA: Insufficient documentation

## 2017-10-07 DIAGNOSIS — E1151 Type 2 diabetes mellitus with diabetic peripheral angiopathy without gangrene: Secondary | ICD-10-CM | POA: Insufficient documentation

## 2017-10-07 DIAGNOSIS — L89323 Pressure ulcer of left buttock, stage 3: Secondary | ICD-10-CM | POA: Diagnosis not present

## 2017-10-07 DIAGNOSIS — E11621 Type 2 diabetes mellitus with foot ulcer: Secondary | ICD-10-CM | POA: Diagnosis not present

## 2017-10-11 DIAGNOSIS — L97312 Non-pressure chronic ulcer of right ankle with fat layer exposed: Secondary | ICD-10-CM | POA: Diagnosis not present

## 2017-10-11 DIAGNOSIS — L97812 Non-pressure chronic ulcer of other part of right lower leg with fat layer exposed: Secondary | ICD-10-CM | POA: Diagnosis not present

## 2017-10-11 DIAGNOSIS — J439 Emphysema, unspecified: Secondary | ICD-10-CM | POA: Diagnosis not present

## 2017-10-11 DIAGNOSIS — I10 Essential (primary) hypertension: Secondary | ICD-10-CM | POA: Diagnosis not present

## 2017-10-11 DIAGNOSIS — E11622 Type 2 diabetes mellitus with other skin ulcer: Secondary | ICD-10-CM | POA: Diagnosis not present

## 2017-10-11 DIAGNOSIS — I2510 Atherosclerotic heart disease of native coronary artery without angina pectoris: Secondary | ICD-10-CM | POA: Diagnosis not present

## 2017-10-13 DIAGNOSIS — E1151 Type 2 diabetes mellitus with diabetic peripheral angiopathy without gangrene: Secondary | ICD-10-CM | POA: Diagnosis not present

## 2017-10-13 DIAGNOSIS — E11621 Type 2 diabetes mellitus with foot ulcer: Secondary | ICD-10-CM | POA: Diagnosis not present

## 2017-10-13 DIAGNOSIS — L97512 Non-pressure chronic ulcer of other part of right foot with fat layer exposed: Secondary | ICD-10-CM | POA: Diagnosis not present

## 2017-10-13 DIAGNOSIS — J439 Emphysema, unspecified: Secondary | ICD-10-CM | POA: Diagnosis not present

## 2017-10-13 DIAGNOSIS — E11622 Type 2 diabetes mellitus with other skin ulcer: Secondary | ICD-10-CM | POA: Diagnosis not present

## 2017-10-13 DIAGNOSIS — L97812 Non-pressure chronic ulcer of other part of right lower leg with fat layer exposed: Secondary | ICD-10-CM | POA: Diagnosis not present

## 2017-10-13 DIAGNOSIS — L98412 Non-pressure chronic ulcer of buttock with fat layer exposed: Secondary | ICD-10-CM | POA: Diagnosis not present

## 2017-10-13 DIAGNOSIS — I10 Essential (primary) hypertension: Secondary | ICD-10-CM | POA: Diagnosis not present

## 2017-10-13 DIAGNOSIS — I2510 Atherosclerotic heart disease of native coronary artery without angina pectoris: Secondary | ICD-10-CM | POA: Diagnosis not present

## 2017-10-13 DIAGNOSIS — J449 Chronic obstructive pulmonary disease, unspecified: Secondary | ICD-10-CM | POA: Diagnosis not present

## 2017-10-13 DIAGNOSIS — L89323 Pressure ulcer of left buttock, stage 3: Secondary | ICD-10-CM | POA: Diagnosis not present

## 2017-10-13 DIAGNOSIS — L97312 Non-pressure chronic ulcer of right ankle with fat layer exposed: Secondary | ICD-10-CM | POA: Diagnosis not present

## 2017-10-18 DIAGNOSIS — I2510 Atherosclerotic heart disease of native coronary artery without angina pectoris: Secondary | ICD-10-CM | POA: Diagnosis not present

## 2017-10-18 DIAGNOSIS — J439 Emphysema, unspecified: Secondary | ICD-10-CM | POA: Diagnosis not present

## 2017-10-18 DIAGNOSIS — E11622 Type 2 diabetes mellitus with other skin ulcer: Secondary | ICD-10-CM | POA: Diagnosis not present

## 2017-10-18 DIAGNOSIS — I10 Essential (primary) hypertension: Secondary | ICD-10-CM | POA: Diagnosis not present

## 2017-10-18 DIAGNOSIS — L97312 Non-pressure chronic ulcer of right ankle with fat layer exposed: Secondary | ICD-10-CM | POA: Diagnosis not present

## 2017-10-18 DIAGNOSIS — L97812 Non-pressure chronic ulcer of other part of right lower leg with fat layer exposed: Secondary | ICD-10-CM | POA: Diagnosis not present

## 2017-10-21 DIAGNOSIS — L98412 Non-pressure chronic ulcer of buttock with fat layer exposed: Secondary | ICD-10-CM | POA: Diagnosis not present

## 2017-10-21 DIAGNOSIS — L89323 Pressure ulcer of left buttock, stage 3: Secondary | ICD-10-CM | POA: Diagnosis not present

## 2017-10-21 DIAGNOSIS — E1151 Type 2 diabetes mellitus with diabetic peripheral angiopathy without gangrene: Secondary | ICD-10-CM | POA: Diagnosis not present

## 2017-10-21 DIAGNOSIS — E11621 Type 2 diabetes mellitus with foot ulcer: Secondary | ICD-10-CM | POA: Diagnosis not present

## 2017-10-21 DIAGNOSIS — L97512 Non-pressure chronic ulcer of other part of right foot with fat layer exposed: Secondary | ICD-10-CM | POA: Diagnosis not present

## 2017-10-21 DIAGNOSIS — J449 Chronic obstructive pulmonary disease, unspecified: Secondary | ICD-10-CM | POA: Diagnosis not present

## 2017-10-21 DIAGNOSIS — I2510 Atherosclerotic heart disease of native coronary artery without angina pectoris: Secondary | ICD-10-CM | POA: Diagnosis not present

## 2017-10-25 DIAGNOSIS — J439 Emphysema, unspecified: Secondary | ICD-10-CM | POA: Diagnosis not present

## 2017-10-25 DIAGNOSIS — L97812 Non-pressure chronic ulcer of other part of right lower leg with fat layer exposed: Secondary | ICD-10-CM | POA: Diagnosis not present

## 2017-10-25 DIAGNOSIS — L97312 Non-pressure chronic ulcer of right ankle with fat layer exposed: Secondary | ICD-10-CM | POA: Diagnosis not present

## 2017-10-25 DIAGNOSIS — E11622 Type 2 diabetes mellitus with other skin ulcer: Secondary | ICD-10-CM | POA: Diagnosis not present

## 2017-10-25 DIAGNOSIS — I10 Essential (primary) hypertension: Secondary | ICD-10-CM | POA: Diagnosis not present

## 2017-10-25 DIAGNOSIS — I2510 Atherosclerotic heart disease of native coronary artery without angina pectoris: Secondary | ICD-10-CM | POA: Diagnosis not present

## 2017-10-29 DIAGNOSIS — L97812 Non-pressure chronic ulcer of other part of right lower leg with fat layer exposed: Secondary | ICD-10-CM | POA: Diagnosis not present

## 2017-10-29 DIAGNOSIS — J439 Emphysema, unspecified: Secondary | ICD-10-CM | POA: Diagnosis not present

## 2017-10-29 DIAGNOSIS — I2510 Atherosclerotic heart disease of native coronary artery without angina pectoris: Secondary | ICD-10-CM | POA: Diagnosis not present

## 2017-10-29 DIAGNOSIS — L97312 Non-pressure chronic ulcer of right ankle with fat layer exposed: Secondary | ICD-10-CM | POA: Diagnosis not present

## 2017-10-29 DIAGNOSIS — I10 Essential (primary) hypertension: Secondary | ICD-10-CM | POA: Diagnosis not present

## 2017-10-29 DIAGNOSIS — E11622 Type 2 diabetes mellitus with other skin ulcer: Secondary | ICD-10-CM | POA: Diagnosis not present

## 2017-11-01 DIAGNOSIS — E11622 Type 2 diabetes mellitus with other skin ulcer: Secondary | ICD-10-CM | POA: Diagnosis not present

## 2017-11-01 DIAGNOSIS — L97812 Non-pressure chronic ulcer of other part of right lower leg with fat layer exposed: Secondary | ICD-10-CM | POA: Diagnosis not present

## 2017-11-01 DIAGNOSIS — I10 Essential (primary) hypertension: Secondary | ICD-10-CM | POA: Diagnosis not present

## 2017-11-01 DIAGNOSIS — I2510 Atherosclerotic heart disease of native coronary artery without angina pectoris: Secondary | ICD-10-CM | POA: Diagnosis not present

## 2017-11-01 DIAGNOSIS — L97312 Non-pressure chronic ulcer of right ankle with fat layer exposed: Secondary | ICD-10-CM | POA: Diagnosis not present

## 2017-11-01 DIAGNOSIS — J439 Emphysema, unspecified: Secondary | ICD-10-CM | POA: Diagnosis not present

## 2017-11-04 ENCOUNTER — Encounter (HOSPITAL_BASED_OUTPATIENT_CLINIC_OR_DEPARTMENT_OTHER): Payer: Medicare Other | Attending: Internal Medicine

## 2017-11-04 DIAGNOSIS — E11621 Type 2 diabetes mellitus with foot ulcer: Secondary | ICD-10-CM | POA: Insufficient documentation

## 2017-11-04 DIAGNOSIS — Z9221 Personal history of antineoplastic chemotherapy: Secondary | ICD-10-CM | POA: Diagnosis not present

## 2017-11-04 DIAGNOSIS — L97512 Non-pressure chronic ulcer of other part of right foot with fat layer exposed: Secondary | ICD-10-CM | POA: Diagnosis not present

## 2017-11-04 DIAGNOSIS — J449 Chronic obstructive pulmonary disease, unspecified: Secondary | ICD-10-CM | POA: Diagnosis not present

## 2017-11-04 DIAGNOSIS — E1151 Type 2 diabetes mellitus with diabetic peripheral angiopathy without gangrene: Secondary | ICD-10-CM | POA: Diagnosis not present

## 2017-11-04 DIAGNOSIS — L89323 Pressure ulcer of left buttock, stage 3: Secondary | ICD-10-CM | POA: Diagnosis not present

## 2017-11-04 DIAGNOSIS — I10 Essential (primary) hypertension: Secondary | ICD-10-CM | POA: Diagnosis not present

## 2017-11-04 DIAGNOSIS — I2510 Atherosclerotic heart disease of native coronary artery without angina pectoris: Secondary | ICD-10-CM | POA: Diagnosis not present

## 2017-11-08 DIAGNOSIS — E11622 Type 2 diabetes mellitus with other skin ulcer: Secondary | ICD-10-CM | POA: Diagnosis not present

## 2017-11-08 DIAGNOSIS — J439 Emphysema, unspecified: Secondary | ICD-10-CM | POA: Diagnosis not present

## 2017-11-08 DIAGNOSIS — L97812 Non-pressure chronic ulcer of other part of right lower leg with fat layer exposed: Secondary | ICD-10-CM | POA: Diagnosis not present

## 2017-11-08 DIAGNOSIS — I10 Essential (primary) hypertension: Secondary | ICD-10-CM | POA: Diagnosis not present

## 2017-11-08 DIAGNOSIS — I2510 Atherosclerotic heart disease of native coronary artery without angina pectoris: Secondary | ICD-10-CM | POA: Diagnosis not present

## 2017-11-08 DIAGNOSIS — L97312 Non-pressure chronic ulcer of right ankle with fat layer exposed: Secondary | ICD-10-CM | POA: Diagnosis not present

## 2017-11-11 DIAGNOSIS — I10 Essential (primary) hypertension: Secondary | ICD-10-CM | POA: Diagnosis not present

## 2017-11-11 DIAGNOSIS — E11622 Type 2 diabetes mellitus with other skin ulcer: Secondary | ICD-10-CM | POA: Diagnosis not present

## 2017-11-11 DIAGNOSIS — J439 Emphysema, unspecified: Secondary | ICD-10-CM | POA: Diagnosis not present

## 2017-11-11 DIAGNOSIS — L97812 Non-pressure chronic ulcer of other part of right lower leg with fat layer exposed: Secondary | ICD-10-CM | POA: Diagnosis not present

## 2017-11-11 DIAGNOSIS — I2510 Atherosclerotic heart disease of native coronary artery without angina pectoris: Secondary | ICD-10-CM | POA: Diagnosis not present

## 2017-11-11 DIAGNOSIS — L97312 Non-pressure chronic ulcer of right ankle with fat layer exposed: Secondary | ICD-10-CM | POA: Diagnosis not present

## 2017-11-15 DIAGNOSIS — L97812 Non-pressure chronic ulcer of other part of right lower leg with fat layer exposed: Secondary | ICD-10-CM | POA: Diagnosis not present

## 2017-11-15 DIAGNOSIS — E11622 Type 2 diabetes mellitus with other skin ulcer: Secondary | ICD-10-CM | POA: Diagnosis not present

## 2017-11-15 DIAGNOSIS — L97312 Non-pressure chronic ulcer of right ankle with fat layer exposed: Secondary | ICD-10-CM | POA: Diagnosis not present

## 2017-11-15 DIAGNOSIS — I10 Essential (primary) hypertension: Secondary | ICD-10-CM | POA: Diagnosis not present

## 2017-11-15 DIAGNOSIS — I2510 Atherosclerotic heart disease of native coronary artery without angina pectoris: Secondary | ICD-10-CM | POA: Diagnosis not present

## 2017-11-15 DIAGNOSIS — J439 Emphysema, unspecified: Secondary | ICD-10-CM | POA: Diagnosis not present

## 2017-11-17 DIAGNOSIS — L97312 Non-pressure chronic ulcer of right ankle with fat layer exposed: Secondary | ICD-10-CM | POA: Diagnosis not present

## 2017-11-17 DIAGNOSIS — Z7984 Long term (current) use of oral hypoglycemic drugs: Secondary | ICD-10-CM | POA: Diagnosis not present

## 2017-11-17 DIAGNOSIS — Z4800 Encounter for change or removal of nonsurgical wound dressing: Secondary | ICD-10-CM | POA: Diagnosis not present

## 2017-11-17 DIAGNOSIS — Z8701 Personal history of pneumonia (recurrent): Secondary | ICD-10-CM | POA: Diagnosis not present

## 2017-11-17 DIAGNOSIS — I10 Essential (primary) hypertension: Secondary | ICD-10-CM | POA: Diagnosis not present

## 2017-11-17 DIAGNOSIS — Z951 Presence of aortocoronary bypass graft: Secondary | ICD-10-CM | POA: Diagnosis not present

## 2017-11-17 DIAGNOSIS — I2510 Atherosclerotic heart disease of native coronary artery without angina pectoris: Secondary | ICD-10-CM | POA: Diagnosis not present

## 2017-11-17 DIAGNOSIS — Z720 Tobacco use: Secondary | ICD-10-CM | POA: Diagnosis not present

## 2017-11-17 DIAGNOSIS — E1151 Type 2 diabetes mellitus with diabetic peripheral angiopathy without gangrene: Secondary | ICD-10-CM | POA: Diagnosis not present

## 2017-11-17 DIAGNOSIS — J439 Emphysema, unspecified: Secondary | ICD-10-CM | POA: Diagnosis not present

## 2017-11-17 DIAGNOSIS — Z7902 Long term (current) use of antithrombotics/antiplatelets: Secondary | ICD-10-CM | POA: Diagnosis not present

## 2017-11-17 DIAGNOSIS — E11622 Type 2 diabetes mellitus with other skin ulcer: Secondary | ICD-10-CM | POA: Diagnosis not present

## 2017-11-17 DIAGNOSIS — L89322 Pressure ulcer of left buttock, stage 2: Secondary | ICD-10-CM | POA: Diagnosis not present

## 2017-11-18 DIAGNOSIS — L97512 Non-pressure chronic ulcer of other part of right foot with fat layer exposed: Secondary | ICD-10-CM | POA: Diagnosis not present

## 2017-11-18 DIAGNOSIS — E1151 Type 2 diabetes mellitus with diabetic peripheral angiopathy without gangrene: Secondary | ICD-10-CM | POA: Diagnosis not present

## 2017-11-18 DIAGNOSIS — E11621 Type 2 diabetes mellitus with foot ulcer: Secondary | ICD-10-CM | POA: Diagnosis not present

## 2017-11-18 DIAGNOSIS — I2510 Atherosclerotic heart disease of native coronary artery without angina pectoris: Secondary | ICD-10-CM | POA: Diagnosis not present

## 2017-11-18 DIAGNOSIS — J449 Chronic obstructive pulmonary disease, unspecified: Secondary | ICD-10-CM | POA: Diagnosis not present

## 2017-11-18 DIAGNOSIS — L89323 Pressure ulcer of left buttock, stage 3: Secondary | ICD-10-CM | POA: Diagnosis not present

## 2017-11-19 DIAGNOSIS — J439 Emphysema, unspecified: Secondary | ICD-10-CM | POA: Diagnosis not present

## 2017-11-19 DIAGNOSIS — I2510 Atherosclerotic heart disease of native coronary artery without angina pectoris: Secondary | ICD-10-CM | POA: Diagnosis not present

## 2017-11-19 DIAGNOSIS — E1151 Type 2 diabetes mellitus with diabetic peripheral angiopathy without gangrene: Secondary | ICD-10-CM | POA: Diagnosis not present

## 2017-11-19 DIAGNOSIS — L97312 Non-pressure chronic ulcer of right ankle with fat layer exposed: Secondary | ICD-10-CM | POA: Diagnosis not present

## 2017-11-19 DIAGNOSIS — L89322 Pressure ulcer of left buttock, stage 2: Secondary | ICD-10-CM | POA: Diagnosis not present

## 2017-11-19 DIAGNOSIS — E11622 Type 2 diabetes mellitus with other skin ulcer: Secondary | ICD-10-CM | POA: Diagnosis not present

## 2017-11-22 DIAGNOSIS — I2510 Atherosclerotic heart disease of native coronary artery without angina pectoris: Secondary | ICD-10-CM | POA: Diagnosis not present

## 2017-11-22 DIAGNOSIS — E1151 Type 2 diabetes mellitus with diabetic peripheral angiopathy without gangrene: Secondary | ICD-10-CM | POA: Diagnosis not present

## 2017-11-22 DIAGNOSIS — J439 Emphysema, unspecified: Secondary | ICD-10-CM | POA: Diagnosis not present

## 2017-11-22 DIAGNOSIS — E11622 Type 2 diabetes mellitus with other skin ulcer: Secondary | ICD-10-CM | POA: Diagnosis not present

## 2017-11-22 DIAGNOSIS — L89322 Pressure ulcer of left buttock, stage 2: Secondary | ICD-10-CM | POA: Diagnosis not present

## 2017-11-22 DIAGNOSIS — L97312 Non-pressure chronic ulcer of right ankle with fat layer exposed: Secondary | ICD-10-CM | POA: Diagnosis not present

## 2017-11-24 ENCOUNTER — Other Ambulatory Visit: Payer: Self-pay

## 2017-11-24 DIAGNOSIS — Z9862 Peripheral vascular angioplasty status: Secondary | ICD-10-CM

## 2017-11-24 DIAGNOSIS — I779 Disorder of arteries and arterioles, unspecified: Secondary | ICD-10-CM

## 2017-11-24 DIAGNOSIS — I70233 Atherosclerosis of native arteries of right leg with ulceration of ankle: Secondary | ICD-10-CM

## 2017-11-25 DIAGNOSIS — J439 Emphysema, unspecified: Secondary | ICD-10-CM | POA: Diagnosis not present

## 2017-11-25 DIAGNOSIS — I2510 Atherosclerotic heart disease of native coronary artery without angina pectoris: Secondary | ICD-10-CM | POA: Diagnosis not present

## 2017-11-25 DIAGNOSIS — L97312 Non-pressure chronic ulcer of right ankle with fat layer exposed: Secondary | ICD-10-CM | POA: Diagnosis not present

## 2017-11-25 DIAGNOSIS — E1151 Type 2 diabetes mellitus with diabetic peripheral angiopathy without gangrene: Secondary | ICD-10-CM | POA: Diagnosis not present

## 2017-11-25 DIAGNOSIS — L89322 Pressure ulcer of left buttock, stage 2: Secondary | ICD-10-CM | POA: Diagnosis not present

## 2017-11-25 DIAGNOSIS — E11622 Type 2 diabetes mellitus with other skin ulcer: Secondary | ICD-10-CM | POA: Diagnosis not present

## 2017-11-29 DIAGNOSIS — J439 Emphysema, unspecified: Secondary | ICD-10-CM | POA: Diagnosis not present

## 2017-11-29 DIAGNOSIS — E1151 Type 2 diabetes mellitus with diabetic peripheral angiopathy without gangrene: Secondary | ICD-10-CM | POA: Diagnosis not present

## 2017-11-29 DIAGNOSIS — L97312 Non-pressure chronic ulcer of right ankle with fat layer exposed: Secondary | ICD-10-CM | POA: Diagnosis not present

## 2017-11-29 DIAGNOSIS — L89322 Pressure ulcer of left buttock, stage 2: Secondary | ICD-10-CM | POA: Diagnosis not present

## 2017-11-29 DIAGNOSIS — E11622 Type 2 diabetes mellitus with other skin ulcer: Secondary | ICD-10-CM | POA: Diagnosis not present

## 2017-11-29 DIAGNOSIS — I2510 Atherosclerotic heart disease of native coronary artery without angina pectoris: Secondary | ICD-10-CM | POA: Diagnosis not present

## 2017-12-02 ENCOUNTER — Encounter (HOSPITAL_BASED_OUTPATIENT_CLINIC_OR_DEPARTMENT_OTHER): Payer: Medicare Other | Attending: Internal Medicine

## 2017-12-02 DIAGNOSIS — J449 Chronic obstructive pulmonary disease, unspecified: Secondary | ICD-10-CM | POA: Insufficient documentation

## 2017-12-02 DIAGNOSIS — Z9221 Personal history of antineoplastic chemotherapy: Secondary | ICD-10-CM | POA: Insufficient documentation

## 2017-12-02 DIAGNOSIS — L97512 Non-pressure chronic ulcer of other part of right foot with fat layer exposed: Secondary | ICD-10-CM | POA: Diagnosis not present

## 2017-12-02 DIAGNOSIS — I2510 Atherosclerotic heart disease of native coronary artery without angina pectoris: Secondary | ICD-10-CM | POA: Diagnosis not present

## 2017-12-02 DIAGNOSIS — E1151 Type 2 diabetes mellitus with diabetic peripheral angiopathy without gangrene: Secondary | ICD-10-CM | POA: Insufficient documentation

## 2017-12-02 DIAGNOSIS — E11621 Type 2 diabetes mellitus with foot ulcer: Secondary | ICD-10-CM | POA: Insufficient documentation

## 2017-12-02 DIAGNOSIS — I998 Other disorder of circulatory system: Secondary | ICD-10-CM | POA: Diagnosis not present

## 2017-12-02 DIAGNOSIS — I10 Essential (primary) hypertension: Secondary | ICD-10-CM | POA: Diagnosis not present

## 2017-12-07 DIAGNOSIS — I2510 Atherosclerotic heart disease of native coronary artery without angina pectoris: Secondary | ICD-10-CM | POA: Diagnosis not present

## 2017-12-07 DIAGNOSIS — J439 Emphysema, unspecified: Secondary | ICD-10-CM | POA: Diagnosis not present

## 2017-12-07 DIAGNOSIS — E1151 Type 2 diabetes mellitus with diabetic peripheral angiopathy without gangrene: Secondary | ICD-10-CM | POA: Diagnosis not present

## 2017-12-07 DIAGNOSIS — E11622 Type 2 diabetes mellitus with other skin ulcer: Secondary | ICD-10-CM | POA: Diagnosis not present

## 2017-12-07 DIAGNOSIS — L97312 Non-pressure chronic ulcer of right ankle with fat layer exposed: Secondary | ICD-10-CM | POA: Diagnosis not present

## 2017-12-07 DIAGNOSIS — L89322 Pressure ulcer of left buttock, stage 2: Secondary | ICD-10-CM | POA: Diagnosis not present

## 2017-12-09 DIAGNOSIS — E11622 Type 2 diabetes mellitus with other skin ulcer: Secondary | ICD-10-CM | POA: Diagnosis not present

## 2017-12-09 DIAGNOSIS — E1151 Type 2 diabetes mellitus with diabetic peripheral angiopathy without gangrene: Secondary | ICD-10-CM | POA: Diagnosis not present

## 2017-12-09 DIAGNOSIS — I2510 Atherosclerotic heart disease of native coronary artery without angina pectoris: Secondary | ICD-10-CM | POA: Diagnosis not present

## 2017-12-09 DIAGNOSIS — L89322 Pressure ulcer of left buttock, stage 2: Secondary | ICD-10-CM | POA: Diagnosis not present

## 2017-12-09 DIAGNOSIS — L97312 Non-pressure chronic ulcer of right ankle with fat layer exposed: Secondary | ICD-10-CM | POA: Diagnosis not present

## 2017-12-09 DIAGNOSIS — J439 Emphysema, unspecified: Secondary | ICD-10-CM | POA: Diagnosis not present

## 2017-12-13 DIAGNOSIS — E1151 Type 2 diabetes mellitus with diabetic peripheral angiopathy without gangrene: Secondary | ICD-10-CM | POA: Diagnosis not present

## 2017-12-13 DIAGNOSIS — L97312 Non-pressure chronic ulcer of right ankle with fat layer exposed: Secondary | ICD-10-CM | POA: Diagnosis not present

## 2017-12-13 DIAGNOSIS — E11622 Type 2 diabetes mellitus with other skin ulcer: Secondary | ICD-10-CM | POA: Diagnosis not present

## 2017-12-13 DIAGNOSIS — I2510 Atherosclerotic heart disease of native coronary artery without angina pectoris: Secondary | ICD-10-CM | POA: Diagnosis not present

## 2017-12-13 DIAGNOSIS — J439 Emphysema, unspecified: Secondary | ICD-10-CM | POA: Diagnosis not present

## 2017-12-13 DIAGNOSIS — L89322 Pressure ulcer of left buttock, stage 2: Secondary | ICD-10-CM | POA: Diagnosis not present

## 2017-12-16 DIAGNOSIS — I998 Other disorder of circulatory system: Secondary | ICD-10-CM | POA: Diagnosis not present

## 2017-12-16 DIAGNOSIS — L97512 Non-pressure chronic ulcer of other part of right foot with fat layer exposed: Secondary | ICD-10-CM | POA: Diagnosis not present

## 2017-12-16 DIAGNOSIS — E11621 Type 2 diabetes mellitus with foot ulcer: Secondary | ICD-10-CM | POA: Diagnosis not present

## 2017-12-16 DIAGNOSIS — J449 Chronic obstructive pulmonary disease, unspecified: Secondary | ICD-10-CM | POA: Diagnosis not present

## 2017-12-16 DIAGNOSIS — E1151 Type 2 diabetes mellitus with diabetic peripheral angiopathy without gangrene: Secondary | ICD-10-CM | POA: Diagnosis not present

## 2017-12-16 DIAGNOSIS — I10 Essential (primary) hypertension: Secondary | ICD-10-CM | POA: Diagnosis not present

## 2017-12-16 DIAGNOSIS — I2510 Atherosclerotic heart disease of native coronary artery without angina pectoris: Secondary | ICD-10-CM | POA: Diagnosis not present

## 2017-12-20 DIAGNOSIS — I2510 Atherosclerotic heart disease of native coronary artery without angina pectoris: Secondary | ICD-10-CM | POA: Diagnosis not present

## 2017-12-20 DIAGNOSIS — L89322 Pressure ulcer of left buttock, stage 2: Secondary | ICD-10-CM | POA: Diagnosis not present

## 2017-12-20 DIAGNOSIS — E1151 Type 2 diabetes mellitus with diabetic peripheral angiopathy without gangrene: Secondary | ICD-10-CM | POA: Diagnosis not present

## 2017-12-20 DIAGNOSIS — L97312 Non-pressure chronic ulcer of right ankle with fat layer exposed: Secondary | ICD-10-CM | POA: Diagnosis not present

## 2017-12-20 DIAGNOSIS — J439 Emphysema, unspecified: Secondary | ICD-10-CM | POA: Diagnosis not present

## 2017-12-20 DIAGNOSIS — E11622 Type 2 diabetes mellitus with other skin ulcer: Secondary | ICD-10-CM | POA: Diagnosis not present

## 2017-12-21 ENCOUNTER — Ambulatory Visit (INDEPENDENT_AMBULATORY_CARE_PROVIDER_SITE_OTHER): Payer: Medicare Other | Admitting: Family

## 2017-12-21 ENCOUNTER — Ambulatory Visit (HOSPITAL_COMMUNITY)
Admission: RE | Admit: 2017-12-21 | Discharge: 2017-12-21 | Disposition: A | Discharge status: Home / Self Care | Payer: Medicare Other | Source: Ambulatory Visit | Attending: Family | Admitting: Family

## 2017-12-21 ENCOUNTER — Encounter: Payer: Self-pay | Admitting: Family

## 2017-12-21 VITALS — BP 152/74 | HR 110 | Temp 97.4°F | Resp 18 | Ht 61.0 in | Wt 136.0 lb

## 2017-12-21 DIAGNOSIS — E785 Hyperlipidemia, unspecified: Secondary | ICD-10-CM | POA: Insufficient documentation

## 2017-12-21 DIAGNOSIS — I872 Venous insufficiency (chronic) (peripheral): Secondary | ICD-10-CM

## 2017-12-21 DIAGNOSIS — I70233 Atherosclerosis of native arteries of right leg with ulceration of ankle: Secondary | ICD-10-CM | POA: Diagnosis not present

## 2017-12-21 DIAGNOSIS — I779 Disorder of arteries and arterioles, unspecified: Secondary | ICD-10-CM | POA: Diagnosis not present

## 2017-12-21 DIAGNOSIS — Z9862 Peripheral vascular angioplasty status: Secondary | ICD-10-CM | POA: Diagnosis not present

## 2017-12-21 DIAGNOSIS — L97811 Non-pressure chronic ulcer of other part of right lower leg limited to breakdown of skin: Secondary | ICD-10-CM | POA: Diagnosis not present

## 2017-12-21 DIAGNOSIS — F17220 Nicotine dependence, chewing tobacco, uncomplicated: Secondary | ICD-10-CM | POA: Insufficient documentation

## 2017-12-21 DIAGNOSIS — Z87891 Personal history of nicotine dependence: Secondary | ICD-10-CM | POA: Diagnosis not present

## 2017-12-21 DIAGNOSIS — I6521 Occlusion and stenosis of right carotid artery: Secondary | ICD-10-CM

## 2017-12-21 DIAGNOSIS — Z720 Tobacco use: Secondary | ICD-10-CM

## 2017-12-21 DIAGNOSIS — I10 Essential (primary) hypertension: Secondary | ICD-10-CM | POA: Insufficient documentation

## 2017-12-21 DIAGNOSIS — L97519 Non-pressure chronic ulcer of other part of right foot with unspecified severity: Secondary | ICD-10-CM | POA: Diagnosis not present

## 2017-12-21 DIAGNOSIS — I6522 Occlusion and stenosis of left carotid artery: Secondary | ICD-10-CM | POA: Diagnosis not present

## 2017-12-21 DIAGNOSIS — I2510 Atherosclerotic heart disease of native coronary artery without angina pectoris: Secondary | ICD-10-CM | POA: Insufficient documentation

## 2017-12-21 DIAGNOSIS — F17200 Nicotine dependence, unspecified, uncomplicated: Secondary | ICD-10-CM | POA: Diagnosis not present

## 2017-12-21 NOTE — Progress Notes (Signed)
[facility] [place]   CC: Follow up peripheral artery occlusive disease  History of Present Illness Reginald Ayers is a 67 y.o. male who Dr. Myra Gianotti  initially saw in January 2018 for a second opinion regarding ulcers in his right leg.   He is s/p drug coated balloon angioplasty of right superficial femoral artery, and balloon angioplasty of right common femoral artery on 04-20-17 by Dr. Myra Gianotti for in-stent stenosis.  He also has known left carotid occlusion in the setting of moderate right-sided stenosis.  09-07-17 aortogram by Dr. Myra Gianotti: #1 patent right superficial femoral artery stent #2 flush occlusion of left superficial femoral artery. No intervention  Previously, Dr. Myra Gianotti was able to recanalize his occluded right superficial femoral artery stent and place a Viabahn.  (09/22/2016).  On 12/08/2016 he underwent atherectomy and angioplasty of a 80% right posterior tibial artery stenosis.  On 04/20/2017 he was found to have ostial stenosis of the stent within his right superficial femoral artery.  This was treated with drug-coated balloon angioplasty.  He had a CABG in 2010 that included LIMA to LAD. Left radial artery was used as one of the a bypass grafts.   He is treated at the wound care center at [facility] every other week, states HH visits twice/week for medicated compression dressing changes to right lower leg.  He reports left shoulder rotator cuff issue, is unable to raise his left arm above his left shoulder.   He states he was exposed to [employer] in [place], states he was told by the [place] that his DM and vascular disease was facilitated by this.   Pt Diabetic: Yes, states his last A1C was 7.2 Pt smoker: former smoker, quit in 2016, started at age 63, but he uses smokeless tobacco, 1 can every 2-3 days.    Pt meds include: Statin :Yes Betablocker: No ASA: no Other anticoagulants/antiplatelets: Plavix  Past Medical History:  Diagnosis Date  .  Carotid artery disease (HCC)    L-ICA 100%, mod R-ICA dz  . COPD (chronic obstructive pulmonary disease) (HCC)   . Coronary artery disease    s/p CABG February 2010 by Dr. Andrey Spearman  . Diabetes (HCC)   . Emphysema lung (HCC)   . Hyperlipidemia   . Hypertension   . Peripheral arterial disease (HCC)    post left common iliac and right SFA stenting remotely  . Tobacco abuse         Social History Social History   Tobacco Use  . Smoking status: Light Tobacco Smoker    Packs/day: 0.00    Types: Cigarettes    Last attempt to quit: 08/07/2016    Years since quitting: 1.3  . Smokeless tobacco: Current User    Types: Chew  Substance Use Topics  . Alcohol use: No    Alcohol/week: 0.0 oz    Comment: Weekend drinker, quit 2000. NIGHTLY nyquill  . Drug use: No    Family History Family History  Problem Relation Age of Onset  . Heart attack Mother   . CVA Mother   . Heart disease Mother   . Diabetes Sister   . Hypertension Sister     Past Surgical History:  Procedure Laterality Date  . ABDOMINAL AORTOGRAM N/A 04/20/2017   Procedure: ABDOMINAL AORTOGRAM;  Surgeon: Nada Libman, MD;  Location: [facility];  Service: Cardiovascular;  Laterality: N/A;  . ABDOMINAL AORTOGRAM W/LOWER EXTREMITY Right 12/08/2016   Procedure: Abdominal Aortogram w/Lower Extremity;  Surgeon:  Nada Libman, MD;  Location: [facility];  Service: Cardiovascular;  Laterality: Right;  . ABDOMINAL AORTOGRAM W/LOWER EXTREMITY N/A 09/07/2017   Procedure: ABDOMINAL AORTOGRAM W/LOWER EXTREMITY;  Surgeon: Nada Libman, MD;  Location: [facility];  Service: Cardiovascular;  Laterality: N/A;  . CARDIAC CATHETERIZATION  2010  . CAROTID ANGIOGRAM  2014  . CORONARY ARTERY BYPASS GRAFT  2010   LIMA-LAD, Lrad-OM1, SVG-RI, SVG-AM-dRCA  . DOPPLER ECHOCARDIOGRAPHY  2010  . LOWER EXTREMITY ANGIOGRAM  11/2006   left common femoral endarterectomy and patch angioplasty:The mid right SFA was  angioplastied with a 4 x 8  . LOWER EXTREMITY ANGIOGRAM  2003   left common iliac artery stenting by Dr. Erlene Quan  . LOWER EXTREMITY ANGIOGRAPHY Right 04/20/2017   Procedure: Lower Extremity Angiography;  Surgeon: Nada Libman, MD;  Location: [facility] INVASIVE CV LAB;  Service: Cardiovascular;  Laterality: Right;  . NM MYOVIEW LTD  2015  . PERIPHERAL VASCULAR ATHERECTOMY Right 12/08/2016   Procedure: Peripheral Vascular Atherectomy;  Surgeon: Nada Libman, MD;  Location: [facility];  Service: Cardiovascular;  Laterality: Right;  . PERIPHERAL VASCULAR BALLOON ANGIOPLASTY Right 12/08/2016   Procedure: Peripheral Vascular Balloon Angioplasty;  Surgeon: Nada Libman, MD;  Location: [facility];  Service: Cardiovascular;  Laterality: Right;  . PERIPHERAL VASCULAR BALLOON ANGIOPLASTY Right 04/20/2017   Procedure: PERIPHERAL VASCULAR BALLOON ANGIOPLASTY;  Surgeon: Nada Libman, MD;  Location: [facility];  Service: Cardiovascular;  Laterality: Right;  SFA  . PERIPHERAL VASCULAR CATHETERIZATION N/A 09/22/2016   Procedure: Abdominal Aortogram w/ bilateral Lower Extremity Runoff;  Surgeon: Nada Libman, MD;  Location: [facility];  Service: Cardiovascular;  Laterality: N/A;  . PERIPHERAL VASCULAR CATHETERIZATION Right 09/22/2016   Procedure: Peripheral Vascular Intervention;  Surgeon: Nada Libman, MD;  Location: [facility];  Service: Cardiovascular;  Laterality: Right;    Allergies  Allergen Reactions  . Actos [Pioglitazone] Shortness Of Breath    Leg swelling   . Lisinopril Cough    Current Outpatient Medications  Medication Sig Dispense Refill  . albuterol (PROVENTIL HFA;VENTOLIN HFA) 108 (90 Base) MCG/ACT inhaler Inhale 2 puffs into the lungs every 4 (four) hours as needed for wheezing or shortness of breath.     Marland Kitchen albuterol (PROVENTIL) (2.5 MG/3ML) 0.083% nebulizer solution Inhale 3 mLs into the lungs every 3 (three) hours as needed for shortness of  breath. 75 mL 0  . B Complex Vitamins (B COMPLEX PO) Take 1 tablet by mouth 2 (two) times a week.     . Benzocaine (BOIL-EASE EX) Apply 1 application topically daily as needed (BOIL).    Marland Kitchen clopidogrel (PLAVIX) 75 MG tablet Take 75 mg by mouth daily.     . Cyanocobalamin (VITAMIN B-12 IJ) Inject 1 Dose as directed every 30 (thirty) days.     . furosemide (LASIX) 40 MG tablet Take 40 mg by mouth daily as needed for edema.    Marland Kitchen glipiZIDE (GLUCOTROL XL) 10 MG 24 hr tablet Take 10 mg by mouth 2 (two) times daily.     . Homeopathic Products (LEG CRAMP RELIEF SL) Place 2 tablets under the tongue at bedtime as needed (leg cramps).     Marland Kitchen ibuprofen (ADVIL,MOTRIN) 200 MG tablet Take 800 mg by mouth every 8 (eight) hours as needed for moderate pain.    Marland Kitchen irbesartan (AVAPRO) 300 MG tablet Take 300 mg by mouth daily.     Marland Kitchen  IRON PO Take 1 tablet by mouth 4 (four) times a week.     . metFORMIN (GLUCOPHAGE) 500 MG tablet Take 1,000 mg by mouth 2 (two) times daily with a meal.     . NITROSTAT 0.4 MG SL tablet Take 0.4 mg by mouth every 5 (five) minutes as needed for chest pain.   0  . Polyvinyl Alcohol-Povidone (MURINE TEARS FOR DRY EYES OP) Apply 1 drop to eye 3 (three) times daily as needed (dry eyes).    . Pseudoeph-Doxylamine-DM-APAP (NYQUIL PO) Take 1 Dose by mouth at bedtime.     . simvastatin (ZOCOR) 40 MG tablet Take 40 mg by mouth every evening.    Marland Kitchen SPIRIVA HANDIHALER 18 MCG inhalation capsule Place 18 mcg into inhaler and inhale daily at 8 pm.     . traMADol (ULTRAM) 50 MG tablet Take 50 mg by mouth 3 (three) times daily as needed for severe pain.      No current facility-administered medications for this visit.     ROS: See HPI for pertinent positives and negatives.   Physical Examination  Vitals:   12/21/17 1426  BP: (!) 152/74  Pulse: (!) 110  Resp: 18  Temp: (!) 97.4 F (36.3 C)  TempSrc: Oral  SpO2: 97%  Weight: 136 lb (61.7 kg)  Height:  (1.549 m)   Body mass index is 25.7  kg/m.  General: A&O x 3, WDWN, small male. Gait: normal Eyes: PERRLA. Pulmonary: Respirations are non labored, CTAB, fair air movement Cardiac: regular Rhythm, tachycardic at 110 (recheck of heart rate), no detected murmur.         Carotid Bruits Right Left   Negative Negative   Radial pulses: right is 2+ palpable, left radial artery was harvested for use in CABG, left brachial pulse is 1+ palpable.  Adominal aortic pulse is not palpable                         VASCULAR EXAM: Extremities without ischemic changes, without Gangrene; with venous stasis ulcer at dorsal aspect right ankle (see photo below). No open wounds on left leg. Shiny skin at left lower leg.                                                                                                                                                            LE Pulses Right Left       FEMORAL  not palpable  not palpable        POPLITEAL  not palpable   not palpable       POSTERIOR TIBIAL  not palpable   not palpable        DORSALIS PEDIS      ANTERIOR TIBIAL not palpable  not palpable  Abdomen: soft, NT, no palpable masses. Skin: no rashes, see Extremities. Musculoskeletal: no muscle wasting or atrophy.      Neurologic: A&O X 3; Appropriate Affect ; SENSATION: normal; MOTOR FUNCTION:  moving all extremities equally, motor strength 5/5 throughout, unable to raise left arm over about 15%. Speech is fluent/normal/loud/edentulous. CN 2-12 intact Psychiatric: Thought content is normal, mood appropriate for clinical situation.    ASSESSMENT: Reginald Ayers is a 67 y.o. male who is s/p drug coated balloon angioplasty of right superficial femoral artery, and balloon angioplasty of right common femoral artery on 04-20-17 by Dr. Myra Gianotti for in-stent stenosis. The patient has previously undergone percutaneous revascularization.   Bilateral femoral pulses are not palpable, but pt does not sem to have claudication  sx's with walking. Venous stasis ulcers right lower leg under treatment by [facility] wound care center and HH, slowly healing.     DATA  Right LE Arterial Duplex (12/21/17): Patent right leg stent with no focal stenosis. Irregular plaque is seen in the popliteal artery with no focal stenosis.  Waveforms are triphasic proximal to stent, biphasic at proximal stent, and monophasic to distal stent.  Highest PSV is at the DFA (278 cm/s).   ABI (Date: 12/21/2017):  R:   ABI: 0.70 (was 0.60 on 07-20-17),   PT: mono  DP: mono  TBI:  0.30, toe pressure of 47 (was 0.24)  L:   ABI: 0.57 (was 0.44),   PT: mono  DP: mono  TBI: 0.29, toe pressure of 46 (was 0.28)  Slightly improved bilateral ABI and right TBI, stable left TBI; moderate disease bilaterally with all monophasic waveforms.    Carotid Duplex (04-15-17): Right ICA: 60-79% stenosis (high end of range) Left ICA: occluded.   PLAN:  The patient was counseled re smokeless tobacco cessation and given several free resources re this.  Based on the patient's vascular studies and examination, pt will return to clinic in 1 month with carotid duplex and see me; Six months with right LE arterial duplex, bilateral aortoiliac duplex, and ABI's. I advised him to notify us if he develops concerns re the circulation in his feet or legs.   Continue venous stasis ulcers treatment by [facility] wound care cent er and HH.    I discussed in depth with the patient the nature of atherosclerosis, and emphasized the importance of maximal medical management including strict control of blood pressure, blood glucose, and lipid levels, obtaining regular exercise, and cessation of tobacco use.  The patient is aware that without maximal medical management the underlying atherosclerotic disease process will progress, limiting the benefit of any interventions.  The patient was given information about PAD including signs, symptoms, treatment, what symptoms  should prompt the patient to seek immediate medical care, and risk reduction measures to take.  Charisse March, RN, MSN, FNP-C [facility] [place] Phone: [phone]  Clinic MD: Early  12/21/17 9:46 PM

## 2017-12-21 NOTE — Patient Instructions (Signed)
What You Need to Know About Smokeless Tobacco Use Tobacco use is one of the leading causes of cancer and other chronic health problems. Smokeless tobacco is tobacco that is put directly into the mouth instead of being smoked. It may also be called chewing tobacco or snuff. Smokeless tobacco is made from the leaves of tobacco plants and it comes in several forms:  Loose, dry leaves, plugs, or twists.  Moist pouches.  Dissolving lozenges or strips.  Chewing, sucking, or holding the tobacco in your mouth causes your mouth to make more saliva. The saliva mixes with the tobacco to make "tobacco juice" that is swallowed or spit out. How can smokeless tobacco affect me? Using smokeless tobacco:  Increases your risk of developing cancer. Smokeless tobacco contains at least 28 different types of cancer-causing chemicals (carcinogens).  Increases your chances of developing other long-term health problems, including high blood pressure, heart disease, stroke, and dental problems.  Can make you become addicted. Nicotine is one of the chemicals in tobacco. When you chew tobacco, you absorb nicotine from the tobacco juice. This can make you feel more alert than usual.  Can cause problems with pregnancy. Pregnant women who use smokeless tobacco are more likely to miscarry or deliver a baby too early (premature delivery).  Can affect the appearance and health of your mouth. Using smokeless tobacco may cause bad breath, yellow-brown teeth, mouth sores, cracking and bleeding lips, gum recession, and lesions on the soft tissues of your mouth (leukoplakia).  What are the benefits of not using smokeless tobacco? The benefits of not using smokeless tobacco include:  A healthy mind because: ? You avoid addiction.  A healthy body because: ? You avoid dental problems. ? You promote healthy pregnancy. ? You avoid long-term health problems.  A healthy wallet because: ? You avoid costs of buying  tobacco. ? You avoid health care costs in the future.  A healthy family because: ? You avoid accidental poisoning of children in your household.  What can happen if I continue to use smokeless tobacco? If you continue to use smokeless tobacco, you will increase your risk for developing certain cancers. These include:  Tongue.  Lips, mouth, and gums.  Throat (esophagus) and voice box (larynx).  Stomach.  Pancreas.  Bladder.  Colon.  Long-term use of smokeless tobacco can also lead to:  High blood pressure, heart disease, and stroke.  Gum disease, gum recession, and bone loss around the teeth.  Tooth decay.  How do I quit using smokeless tobacco? Quitting the use of smokeless tobacco can be hard, but it can be done. Follow these steps:  Pick a date to quit. Set a date within the next two weeks. This gives you time to prepare.  Write down the reasons why you are quitting. Keep this list in places where you will see it often, such as on your bathroom mirror or in your car or wallet.  Identify the people, places, things, and activities that make you want to use tobacco (triggers) and avoid them.  Get rid of any tobacco you have and remove any tobacco smells. To do this: ? Throw away all containers of tobacco at home, at work, and in your car. ? Throw away any other items that you use regularly when you chew tobacco. ? Clean your car and make sure to remove all tobacco-related items. ? Clean your home, including curtains and carpets.  Tell your family, friends, and coworkers that you are quitting. This can make quitting   easier.  Ask your health care provider for help quitting smokeless tobacco. This may involve treatment. Find out what treatment options are covered by your health insurance.  Keep track of how many days have passed since you quit. Remembering how long and hard you have worked to quit can help you avoid using tobacco again.  Where can I get support? Ask  your health care provider if there is a local support group for quitting smokeless tobacco. Where can I get more information? You can learn more about the risks of using smokeless tobacco and the benefits of quitting from these sources:  National Cancer Institute: www.cancer.gov  American Cancer Society: www.cancer.org  When should I seek medical care? Seek medical care if you have:  White or other discolored patches in your mouth.  Difficulty swallowing.  A change in your voice.  Unexplained weight loss.  Stomach pain, nausea, or vomiting.  Summary  Smokeless tobacco contains at least 28 different chemicals that are known to cause cancer (carcinogen).  Nicotine is an addictive chemical in smokeless tobacco.  When you quit using smokeless tobacco, you lower your risk of developing cancer. This information is not intended to replace advice given to you by your health care provider. Make sure you discuss any questions you have with your health care provider. Document Released: 01/12/2011 Document Revised: 04/04/2016 Document Reviewed: 03/22/2015 Elsevier Interactive Patient Education  2018 Elsevier Inc.     Venous Ulcer A venous ulcer is a shallow sore on your lower leg. It is caused by poor circulation in your veins. Venous ulcer is the most common type of lower leg ulcer. You may have venous ulcers on one leg or on both legs. This condition most often develops around your ankles. This type of ulcer may last for a long time (chronic ulcer) or it may return often (recurrent ulcer). Follow these instructions at home: Wound care  Follow instructions from your doctor about: ? How to take care of your wound. ? When and how you should change your bandage (dressing). ? When you should remove your bandage. If your bandage is dry and gets stuck to your leg when you try to remove it, moisten or wet the bandage with saline solution or water. This helps you to remove it without harming  your skin or wound.  Check your wound every day for signs of infection. Have a caregiver do this for you if you are not able to do it yourself. Watch for: ? More redness, swelling, or pain. ? More fluid or blood. ? Pus, warmth, or a bad smell. Medicines  Take over-the-counter and prescription medicines only as told by your doctor.  If you were prescribed an antibiotic medicine, take it or apply it as told by your doctor. Do not stop taking or using the antibiotic even if your condition improves. Activity  Do not stand or sit in one position for a long period of time. Rest with your legs raised during the day. If possible, keep your legs above your heart for 30 minutes, 3-4 times a day, or as told by your doctor.  Do not sit with your legs crossed.  Walk often to increase the blood flow in your legs. Ask your doctor what level of activity is safe for you.  If you are taking a long ride in a car or plane, take a break to walk around at least once every two hours, or as told by your doctor. Ask your doctor if you should take  aspirin before long trips. General instructions   Wear elastic stockings, compression stockings, or support hose as told by your doctor. This is very important.  Raise the foot of your bed as told by your doctor.  Do not smoke.  Keep all follow-up visits as told by your doctor. This is important. Contact a doctor if:  You have a fever.  Your ulcer is getting larger or is not healing.  Your pain gets worse.  You have more redness or swelling around your ulcer.  You have more fluid, blood, or pus coming from your ulcer after it has been cleaned by you or your doctor.  You have warmth or a bad smell coming from your ulcer. This information is not intended to replace advice given to you by your health care provider. Make sure you discuss any questions you have with your health care provider. Document Released: 09/17/2004 Document Revised: 01/16/2016 Document  Reviewed: 12/19/2014 Elsevier Interactive Patient Education  2018 Elsevier Inc.     Peripheral Vascular Disease Peripheral vascular disease (PVD) is a disease of the blood vessels that are not part of your heart and brain. A simple term for PVD is poor circulation. In most cases, PVD narrows the blood vessels that carry blood from your heart to the rest of your body. This can result in a decreased supply of blood to your arms, legs, and internal organs, like your stomach or kidneys. However, it most often affects a person's lower legs and feet. There are two types of PVD.  Organic PVD. This is the more common type. It is caused by damage to the structure of blood vessels.  Functional PVD. This is caused by conditions that make blood vessels contract and tighten (spasm).  Without treatment, PVD tends to get worse over time. PVD can also lead to acute ischemic limb. This is when an arm or limb suddenly has trouble getting enough blood. This is a medical emergency. Follow these instructions at home:  Take medicines only as told by your doctor.  Do not use any tobacco products, including cigarettes, chewing tobacco, or electronic cigarettes. If you need help quitting, ask your doctor.  Lose weight if you are overweight, and maintain a healthy weight as told by your doctor.  Eat a diet that is low in fat and cholesterol. If you need help, ask your doctor.  Exercise regularly. Ask your doctor for some good activities for you.  Take good care of your feet. ? Wear comfortable shoes that fit well. ? Check your feet often for any cuts or sores. Contact a doctor if:  You have cramps in your legs while walking.  You have leg pain when you are at rest.  You have coldness in a leg or foot.  Your skin changes.  You are unable to get or have an erection (erectile dysfunction).  You have cuts or sores on your feet that are not healing. Get help right away if:  Your arm or leg turns cold  and blue.  Your arms or legs become red, warm, swollen, painful, or numb.  You have chest pain or trouble breathing.  You suddenly have weakness in your face, arm, or leg.  You become very confused or you cannot speak.  You suddenly have a very bad headache.  You suddenly cannot see. This information is not intended to replace advice given to you by your health care provider. Make sure you discuss any questions you have with your health care provider. Document  Released: 11/04/2009 Document Revised: 01/16/2016 Document Reviewed: 01/18/2014 Elsevier Interactive Patient Education  2017 [employer].

## 2017-12-23 DIAGNOSIS — E11622 Type 2 diabetes mellitus with other skin ulcer: Secondary | ICD-10-CM | POA: Diagnosis not present

## 2017-12-23 DIAGNOSIS — I2510 Atherosclerotic heart disease of native coronary artery without angina pectoris: Secondary | ICD-10-CM | POA: Diagnosis not present

## 2017-12-23 DIAGNOSIS — E1151 Type 2 diabetes mellitus with diabetic peripheral angiopathy without gangrene: Secondary | ICD-10-CM | POA: Diagnosis not present

## 2017-12-23 DIAGNOSIS — L97312 Non-pressure chronic ulcer of right ankle with fat layer exposed: Secondary | ICD-10-CM | POA: Diagnosis not present

## 2017-12-23 DIAGNOSIS — J439 Emphysema, unspecified: Secondary | ICD-10-CM | POA: Diagnosis not present

## 2017-12-23 DIAGNOSIS — L89322 Pressure ulcer of left buttock, stage 2: Secondary | ICD-10-CM | POA: Diagnosis not present

## 2017-12-27 ENCOUNTER — Other Ambulatory Visit: Payer: Self-pay | Admitting: Internal Medicine

## 2017-12-27 ENCOUNTER — Ambulatory Visit
Admission: RE | Admit: 2017-12-27 | Discharge: 2017-12-27 | Disposition: A | Discharge status: Home / Self Care | Payer: Medicare Other | Source: Ambulatory Visit | Attending: Internal Medicine | Admitting: Internal Medicine

## 2017-12-27 DIAGNOSIS — L89322 Pressure ulcer of left buttock, stage 2: Secondary | ICD-10-CM | POA: Diagnosis not present

## 2017-12-27 DIAGNOSIS — R079 Chest pain, unspecified: Secondary | ICD-10-CM | POA: Diagnosis not present

## 2017-12-27 DIAGNOSIS — L97312 Non-pressure chronic ulcer of right ankle with fat layer exposed: Secondary | ICD-10-CM | POA: Diagnosis not present

## 2017-12-27 DIAGNOSIS — I5020 Unspecified systolic (congestive) heart failure: Secondary | ICD-10-CM

## 2017-12-27 DIAGNOSIS — R0981 Nasal congestion: Secondary | ICD-10-CM | POA: Diagnosis not present

## 2017-12-27 DIAGNOSIS — E1151 Type 2 diabetes mellitus with diabetic peripheral angiopathy without gangrene: Secondary | ICD-10-CM | POA: Diagnosis not present

## 2017-12-27 DIAGNOSIS — R0789 Other chest pain: Secondary | ICD-10-CM | POA: Diagnosis not present

## 2017-12-27 DIAGNOSIS — J439 Emphysema, unspecified: Secondary | ICD-10-CM | POA: Diagnosis not present

## 2017-12-27 DIAGNOSIS — E11622 Type 2 diabetes mellitus with other skin ulcer: Secondary | ICD-10-CM | POA: Diagnosis not present

## 2017-12-27 DIAGNOSIS — I509 Heart failure, unspecified: Secondary | ICD-10-CM | POA: Diagnosis not present

## 2017-12-27 DIAGNOSIS — R600 Localized edema: Secondary | ICD-10-CM | POA: Diagnosis not present

## 2017-12-27 DIAGNOSIS — E11319 Type 2 diabetes mellitus with unspecified diabetic retinopathy without macular edema: Secondary | ICD-10-CM | POA: Diagnosis not present

## 2017-12-27 DIAGNOSIS — I2510 Atherosclerotic heart disease of native coronary artery without angina pectoris: Secondary | ICD-10-CM | POA: Diagnosis not present

## 2017-12-27 DIAGNOSIS — R000 Tachycardia, unspecified: Secondary | ICD-10-CM | POA: Diagnosis not present

## 2017-12-27 IMAGING — DX DG CHEST 2V
2 series · 2 of 2 positions shown · non-contrast
Comparison: [DATE]

CLINICAL DATA: Chest pain

EXAM:
CHEST - 2 VIEW

[dg chest 2 view (1 of 2)]
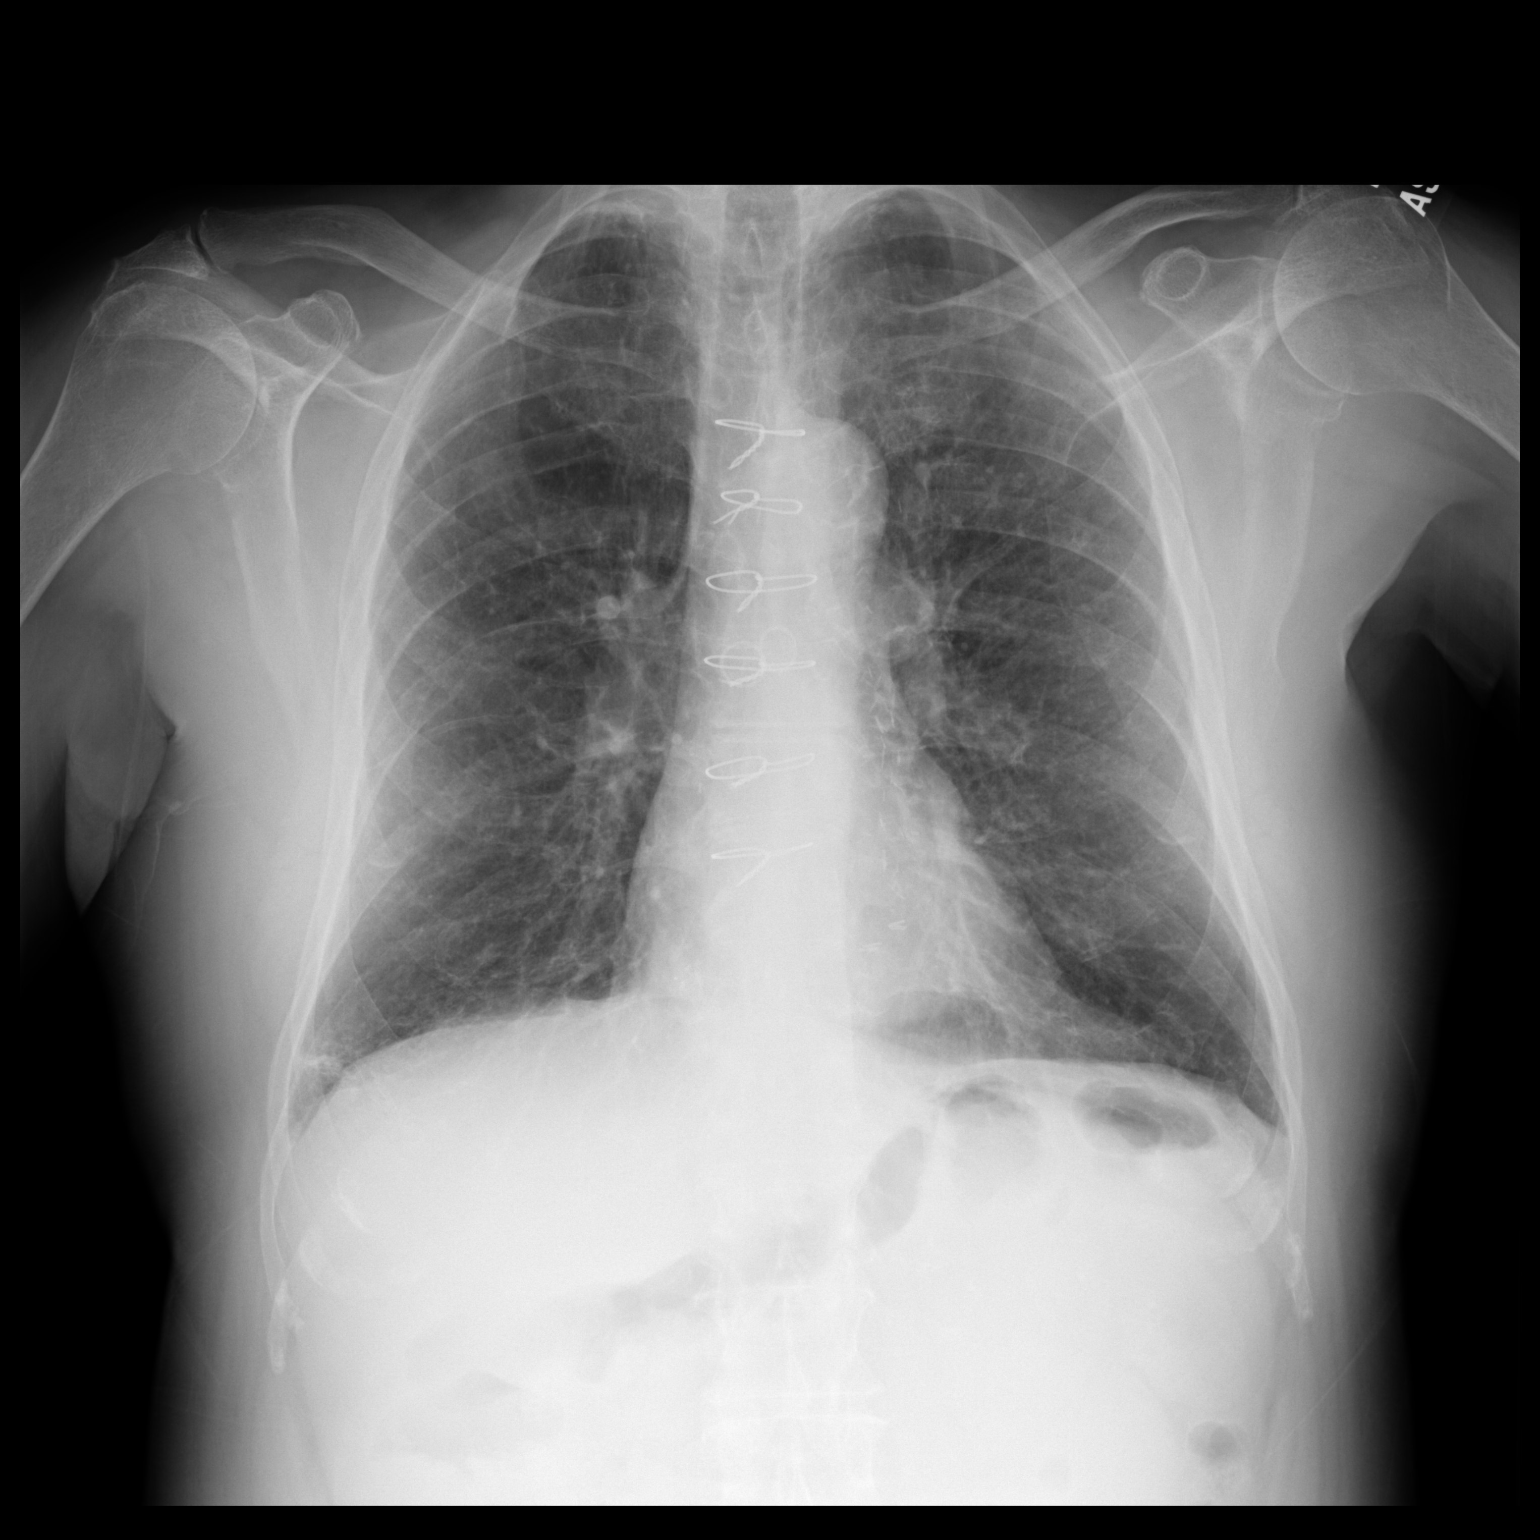

[dg chest 2 view (2 of 2)]
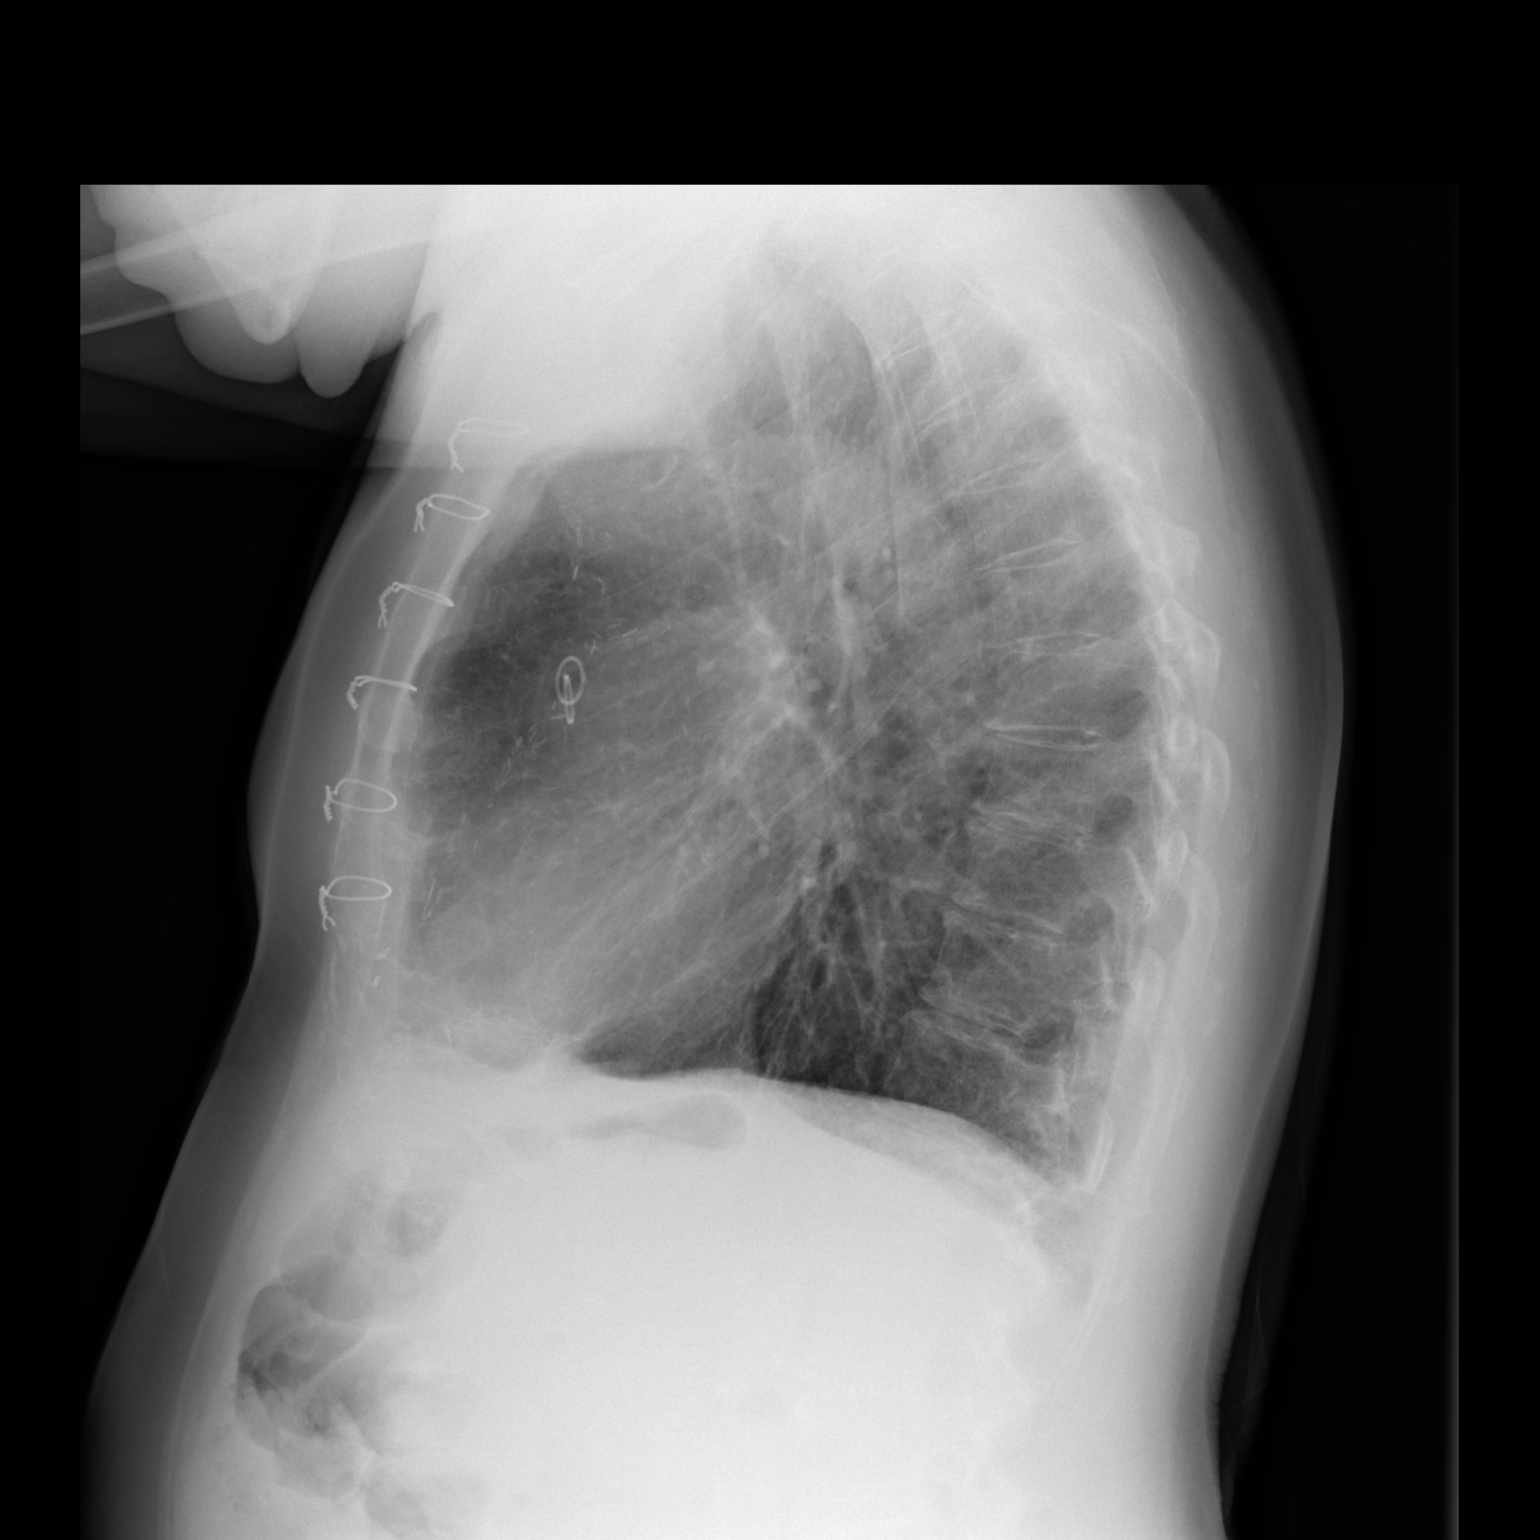

[2 of 2 positions shown; findings below may reference images not displayed]

FINDINGS: Cardiac shadow is stable. Postsurgical changes are again seen. The
lungs are hyperinflated with mild increased interstitial changes
stable from the prior exam. The bony structures show no acute
abnormality.
IMPRESSION: Chronic changes without acute abnormality.

## 2017-12-28 ENCOUNTER — Encounter: Payer: Self-pay | Admitting: Cardiovascular Disease

## 2017-12-28 ENCOUNTER — Ambulatory Visit (INDEPENDENT_AMBULATORY_CARE_PROVIDER_SITE_OTHER): Payer: Medicare Other | Admitting: Cardiovascular Disease

## 2017-12-28 VITALS — BP 178/84 | HR 97 | Ht 61.0 in | Wt 135.0 lb

## 2017-12-28 DIAGNOSIS — R079 Chest pain, unspecified: Secondary | ICD-10-CM | POA: Diagnosis not present

## 2017-12-28 DIAGNOSIS — I10 Essential (primary) hypertension: Secondary | ICD-10-CM

## 2017-12-28 DIAGNOSIS — D649 Anemia, unspecified: Secondary | ICD-10-CM | POA: Diagnosis not present

## 2017-12-28 DIAGNOSIS — I779 Disorder of arteries and arterioles, unspecified: Secondary | ICD-10-CM | POA: Diagnosis not present

## 2017-12-28 DIAGNOSIS — I739 Peripheral vascular disease, unspecified: Secondary | ICD-10-CM

## 2017-12-28 DIAGNOSIS — E7800 Pure hypercholesterolemia, unspecified: Secondary | ICD-10-CM | POA: Diagnosis not present

## 2017-12-28 DIAGNOSIS — I6522 Occlusion and stenosis of left carotid artery: Secondary | ICD-10-CM

## 2017-12-28 MED ORDER — METOPROLOL SUCCINATE ER 25 MG PO TB24: 25.0000 mg | ORAL_TABLET | Freq: Every day | ORAL | 6 refills | Status: DC

## 2017-12-28 NOTE — Assessment & Plan Note (Signed)
History of CAD status post coronary artery bypass grafting February 2010 by Dr. [name] the LIMA to his LAD, left radial to the first obtuse marginal branch, vein to the ramus intermedius branch and sequential vein to the acute marginal and distal RCA.  His last Myoview performed 08/29/2013 was nonischemic.  He is complaining of some stabbing-like chest pain which is new onset relieved with sublingual nitroglycerin.  I am going to repeat a pharmacologic Myoview stress test to further evaluate.

## 2017-12-28 NOTE — Assessment & Plan Note (Signed)
New onset chest pain relieved with nitroglycerin 9 years status post coronary artery bypass grafting.  We will recheck a pharmacologic Myoview stress test to risk stratify

## 2017-12-28 NOTE — Assessment & Plan Note (Signed)
History of carotid artery disease with a known occluded left internal carotid artery and moderate right ICA stenosis by duplex ultrasound 07/30/2015 apparently now followed by Dr. Myra Gianotti

## 2017-12-28 NOTE — Assessment & Plan Note (Signed)
History of hyperlipidemia on statin therapy. 

## 2017-12-28 NOTE — Patient Instructions (Signed)
Medication Instructions: Your physician recommends that you continue on your current medications as directed. Please refer to the Current Medication list given to you today.  START Metoprolol Succinate (TOPROL XL) 25 mg daily.   Testing/Procedures: Your physician has requested that you have a lexiscan myoview. For further information please visit [email or web address] Please follow instruction sheet, as given.  Follow-Up: We request that you follow-up in: 6 months with an extender and in 12 months with Dr San Morelle will receive a reminder letter in the mail two months in advance. If you don't receive a letter, please call our office to schedule the follow-up appointment.  If you need a refill on your cardiac medications before your next appointment, please call your pharmacy.

## 2017-12-28 NOTE — Assessment & Plan Note (Signed)
History of peripheral arterial disease status post multiple conventions in the past by myself including stenting of his left common iliac December 2003 and right SFA April 2008 Doppler suggestive of occluded right SFA in the past.  He did have a nonhealing ulcer and underwent endovascular therapy by Dr. Michiel Cowboy 08/15/2017 with percutaneous recatheterization of an occluded right SFA stent placement of a Viabahn covered stent.  He underwent repeat angiography 09/07/2017 revealing this to be widely patent.

## 2017-12-28 NOTE — Progress Notes (Signed)
12/28/2017 Reginald Ayers   20-Aug-1951  [id number]  Primary Physician Renford Dills, MD Primary Cardiologist: Runell Gess MD FACP, [place], [place], [place]  HPI:  Reginald Ayers is a 67 y.o.  , thin-appearing, single Caucasian male with no children whom I last saw 11 months ago. He has a history of CAD and PAD.I  last saw him in the office 02/12/2016. I stented his proximal left common iliac artery in December 2003 and right SFA in April of 2008. He does have significant infrapopliteal disease bilaterally and known occluded left SFA. He also has an occluded right SFA by Dopplers in our office last September was earlier this month.. His ABIs were 0.48 on the right and 0.52 on the left, but he really denies claudication. He has known carotid disease with occluded left internal carotid artery and moderate right ICA stenosis, which has remained ultrasonographically stable. He is neurologically asymptomatic. His other problems include diabetes, hypertension, hyperlipidemia, and long-term tobacco abuse, smoking one-half pack per day. He had coronary artery bypass grafting by Dr. Andrey Spearman in February of 2010 with LIMA to his LAD, left radial to the first obtuse marginal branch, vein to ramus intermedius, and a sequential vein to an acute marginal and distal right coronary artery. Myoview performed in May of 2010 was nonischemic and showed apical scar consistent with his wall motion abnormality and his known occluded LAD.Dr. Earl Gala follows his lipid profile.  Since I saw him back 2 years ago he has had recurrent critical limb ischemia and had intervention by Dr. Myra Gianotti in December of last year opening up an occluded right SFA stent that I have placed 11 years ago with a Viabahn. covered stent.  He has recently complained of recurrent nitro responsive chest pain.  Did have a Myoview stress test performed in 2015 which was low risk.   Current Meds  Medication Sig  . B Complex Vitamins (B COMPLEX  PO) Take 1 tablet by mouth 2 (two) times a week.   . Benzocaine (BOIL-EASE EX) Apply 1 application topically daily as needed (BOIL).  Marland Kitchen clopidogrel (PLAVIX) 75 MG tablet Take 75 mg by mouth daily.   . Cyanocobalamin (VITAMIN B-12 IJ) Inject 1 Dose as directed every 30 (thirty) days.   . furosemide (LASIX) 40 MG tablet Take 40 mg by mouth daily as needed for edema.  Marland Kitchen glipiZIDE (GLUCOTROL XL) 10 MG 24 hr tablet Take 10 mg by mouth 2 (two) times daily.   . Homeopathic Products (LEG CRAMP RELIEF SL) Place 2 tablets under the tongue at bedtime as needed (leg cramps).   Marland Kitchen ibuprofen (ADVIL,MOTRIN) 200 MG tablet Take 800 mg by mouth every 8 (eight) hours as needed for moderate pain.  Marland Kitchen irbesartan (AVAPRO) 300 MG tablet Take 300 mg by mouth daily.   . IRON PO Take 1 tablet by mouth 4 (four) times a week.   . metFORMIN (GLUCOPHAGE) 500 MG tablet Take 1,000 mg by mouth 2 (two) times daily with a meal.   . NITROSTAT 0.4 MG SL tablet Take 0.4 mg by mouth every 5 (five) minutes as needed for chest pain.   . Polyvinyl Alcohol-Povidone (MURINE TEARS FOR DRY EYES OP) Apply 1 drop to eye 3 (three) times daily as needed (dry eyes).  . potassium chloride (K-DUR) 10 MEQ tablet Take 10 mEq by mouth daily.  . Pseudoeph-Doxylamine-DM-APAP (NYQUIL PO) Take 1 Dose by mouth at bedtime.   . simvastatin (ZOCOR) 40 MG tablet Take 40 mg by  mouth every evening.  Marland Kitchen SPIRIVA HANDIHALER 18 MCG inhalation capsule Place 18 mcg into inhaler and inhale daily at 8 pm.   . traMADol (ULTRAM) 50 MG tablet Take 50 mg by mouth 3 (three) times daily as needed for severe pain.      Allergies  Allergen Reactions  . Actos [Pioglitazone] Shortness Of Breath    Leg swelling   . Lisinopril Cough    Social History   Socioeconomic History  . Marital status: Single    Spouse name: Not on file  . Number of children: Not on file  . Years of education: Not on file  . Highest education level: Not on file  Occupational History  .  Occupation: Retired Set designer  . Financial resource strain: Not on file  . Food insecurity:    Worry: Not on file    Inability: Not on file  . Transportation needs:    Medical: Not on file    Non-medical: Not on file  Tobacco Use  . Smoking status: Light Tobacco Smoker    Packs/day: 0.00    Types: Cigarettes    Last attempt to quit: 08/07/2016    Years since quitting: 1.3  . Smokeless tobacco: Current User    Types: Chew  Substance and Sexual Activity  . Alcohol use: No    Alcohol/week: 0.0 oz    Comment: Weekend drinker, quit 2000. NIGHTLY nyquill  . Drug use: No  . Sexual activity: Not on file  Lifestyle  . Physical activity:    Days per week: Not on file    Minutes per session: Not on file  . Stress: Not on file  Relationships  . Social connections:    Talks on phone: Not on file    Gets together: Not on file    Attends religious service: Not on file    Active member of club or organization: Not on file    Attends meetings of clubs or organizations: Not on file    Relationship status: Not on file  . Intimate partner violence:    Fear of current or ex partner: Not on file    Emotionally abused: Not on file    Physically abused: Not on file    Forced sexual activity: Not on file  Other Topics Concern  . Not on file  Social History Narrative   Lives in [place]. Has 2 roommates, they both smoke. Has 9 dogs.     Review of Systems: General: negative for chills, fever, night sweats or weight changes.  Cardiovascular: negative for chest pain, dyspnea on exertion, edema, orthopnea, palpitations, paroxysmal nocturnal dyspnea or shortness of breath Dermatological: negative for rash Respiratory: negative for cough or wheezing Urologic: negative for hematuria Abdominal: negative for nausea, vomiting, diarrhea, bright red blood per rectum, melena, or hematemesis Neurologic: negative for visual changes, syncope, or dizziness All other systems reviewed and are  otherwise negative except as noted above.    Blood pressure (!) 178/84, pulse 97, height  (1.549 m), weight 135 lb (61.2 kg).  General appearance: alert and no distress Neck: no adenopathy, no JVD, supple, symmetrical, trachea midline, thyroid not enlarged, symmetric, no tenderness/mass/nodules and High-pitched right carotid bruit Lungs: clear to auscultation bilaterally Heart: regular rate and rhythm, S1, S2 normal, no murmur, click, rub or gallop Extremities: extremities normal, atraumatic, no cyanosis or edema Pulses: Diminished pedal pulses Skin: Skin color, texture, turgor normal. No rashes or lesions Neurologic: Alert and oriented X 3, normal strength and  tone. Normal symmetric reflexes. Normal coordination and gait  EKG sinus rhythm at 97 with septal Q waves.  I personally reviewed this EKG.  ASSESSMENT AND PLAN:   Hx of CABG Feb 2010 History of CAD status post coronary artery bypass grafting February 2010 by Dr. [name] the LIMA to his LAD, left radial to the first obtuse marginal branch, vein to the ramus intermedius branch and sequential vein to the acute marginal and distal RCA.  His last Myoview performed 08/29/2013 was nonischemic.  He is complaining of some stabbing-like chest pain which is new onset relieved with sublingual nitroglycerin.  I am going to repeat a pharmacologic Myoview stress test to further evaluate.  Peripheral arterial disease (HCC) History of peripheral arterial disease status post multiple conventions in the past by myself including stenting of his left common iliac December 2003 and right SFA April 2008 Doppler suggestive of occluded right SFA in the past.  He did have a nonhealing ulcer and underwent endovascular therapy by Dr. Michiel Cowboy 08/15/2017 with percutaneous recatheterization of an occluded right SFA stent placement of a Viabahn covered stent.  He underwent repeat angiography 09/07/2017 revealing this to be widely patent.  Carotid artery  disease (HCC) History of carotid artery disease with a known occluded left internal carotid artery and moderate right ICA stenosis by duplex ultrasound 07/30/2015 apparently now followed by Dr. Myra Gianotti   Essential hypertension History of essential hypertension her blood pressure measured at 178/84 with a pulse of 97 he is on Avapro.  I am going to add low-dose metoprolol for blood pressure and heart rate.  Hyperlipidemia History of hyperlipidemia on statin therapy  Chest pain New onset chest pain relieved with nitroglycerin 9 years status post coronary artery bypass grafting.  We will recheck a pharmacologic Myoview stress test to risk stratify      Runell Gess MD [facility] 12/28/2017 4:10 PM

## 2017-12-28 NOTE — Assessment & Plan Note (Signed)
History of essential hypertension her blood pressure measured at 178/84 with a pulse of 97 he is on Avapro.  I am going to add low-dose metoprolol for blood pressure and heart rate.

## 2017-12-30 ENCOUNTER — Encounter (HOSPITAL_BASED_OUTPATIENT_CLINIC_OR_DEPARTMENT_OTHER): Payer: Medicare Other | Attending: Internal Medicine

## 2017-12-30 DIAGNOSIS — Z9221 Personal history of antineoplastic chemotherapy: Secondary | ICD-10-CM | POA: Insufficient documentation

## 2017-12-30 DIAGNOSIS — L97512 Non-pressure chronic ulcer of other part of right foot with fat layer exposed: Secondary | ICD-10-CM | POA: Diagnosis not present

## 2017-12-30 DIAGNOSIS — J449 Chronic obstructive pulmonary disease, unspecified: Secondary | ICD-10-CM | POA: Insufficient documentation

## 2017-12-30 DIAGNOSIS — I10 Essential (primary) hypertension: Secondary | ICD-10-CM | POA: Diagnosis not present

## 2017-12-30 DIAGNOSIS — E1151 Type 2 diabetes mellitus with diabetic peripheral angiopathy without gangrene: Secondary | ICD-10-CM | POA: Diagnosis not present

## 2017-12-30 DIAGNOSIS — I872 Venous insufficiency (chronic) (peripheral): Secondary | ICD-10-CM | POA: Diagnosis not present

## 2017-12-30 DIAGNOSIS — E11621 Type 2 diabetes mellitus with foot ulcer: Secondary | ICD-10-CM | POA: Insufficient documentation

## 2017-12-30 DIAGNOSIS — I2510 Atherosclerotic heart disease of native coronary artery without angina pectoris: Secondary | ICD-10-CM | POA: Insufficient documentation

## 2017-12-31 ENCOUNTER — Telehealth (HOSPITAL_COMMUNITY): Payer: Self-pay

## 2017-12-31 NOTE — Telephone Encounter (Signed)
Encounter complete. 

## 2018-01-03 DIAGNOSIS — L89322 Pressure ulcer of left buttock, stage 2: Secondary | ICD-10-CM | POA: Diagnosis not present

## 2018-01-03 DIAGNOSIS — J439 Emphysema, unspecified: Secondary | ICD-10-CM | POA: Diagnosis not present

## 2018-01-03 DIAGNOSIS — E11622 Type 2 diabetes mellitus with other skin ulcer: Secondary | ICD-10-CM | POA: Diagnosis not present

## 2018-01-03 DIAGNOSIS — E1151 Type 2 diabetes mellitus with diabetic peripheral angiopathy without gangrene: Secondary | ICD-10-CM | POA: Diagnosis not present

## 2018-01-03 DIAGNOSIS — I2510 Atherosclerotic heart disease of native coronary artery without angina pectoris: Secondary | ICD-10-CM | POA: Diagnosis not present

## 2018-01-03 DIAGNOSIS — L97312 Non-pressure chronic ulcer of right ankle with fat layer exposed: Secondary | ICD-10-CM | POA: Diagnosis not present

## 2018-01-04 ENCOUNTER — Other Ambulatory Visit: Payer: Self-pay

## 2018-01-04 DIAGNOSIS — I6523 Occlusion and stenosis of bilateral carotid arteries: Secondary | ICD-10-CM

## 2018-01-05 ENCOUNTER — Ambulatory Visit (HOSPITAL_COMMUNITY)
Admission: RE | Admit: 2018-01-05 | Discharge: 2018-01-05 | Disposition: A | Discharge status: Home / Self Care | Payer: Medicare Other | Source: Ambulatory Visit | Attending: Cardiovascular Disease | Admitting: Cardiovascular Disease

## 2018-01-05 DIAGNOSIS — I739 Peripheral vascular disease, unspecified: Secondary | ICD-10-CM | POA: Insufficient documentation

## 2018-01-05 DIAGNOSIS — Z8673 Personal history of transient ischemic attack (TIA), and cerebral infarction without residual deficits: Secondary | ICD-10-CM | POA: Diagnosis not present

## 2018-01-05 DIAGNOSIS — E785 Hyperlipidemia, unspecified: Secondary | ICD-10-CM | POA: Diagnosis not present

## 2018-01-05 DIAGNOSIS — I2510 Atherosclerotic heart disease of native coronary artery without angina pectoris: Secondary | ICD-10-CM | POA: Insufficient documentation

## 2018-01-05 DIAGNOSIS — K219 Gastro-esophageal reflux disease without esophagitis: Secondary | ICD-10-CM | POA: Diagnosis not present

## 2018-01-05 DIAGNOSIS — E119 Type 2 diabetes mellitus without complications: Secondary | ICD-10-CM | POA: Insufficient documentation

## 2018-01-05 DIAGNOSIS — F17200 Nicotine dependence, unspecified, uncomplicated: Secondary | ICD-10-CM | POA: Insufficient documentation

## 2018-01-05 DIAGNOSIS — J449 Chronic obstructive pulmonary disease, unspecified: Secondary | ICD-10-CM | POA: Insufficient documentation

## 2018-01-05 DIAGNOSIS — I10 Essential (primary) hypertension: Secondary | ICD-10-CM | POA: Diagnosis not present

## 2018-01-05 DIAGNOSIS — R079 Chest pain, unspecified: Secondary | ICD-10-CM | POA: Insufficient documentation

## 2018-01-05 DIAGNOSIS — Z951 Presence of aortocoronary bypass graft: Secondary | ICD-10-CM | POA: Diagnosis not present

## 2018-01-05 LAB — MYOCARDIAL PERFUSION IMAGING
CHL CUP NUCLEAR SDS: 3
CHL CUP NUCLEAR SSS: 10
CSEPPHR: 86 {beats}/min
LV dias vol: 69 mL (ref 62–150)
LV sys vol: 35 mL
NUC STRESS TID: 1.25
Rest HR: 75 {beats}/min
SRS: 7

## 2018-01-05 IMAGING — NM NM MISC PROCEDURE
9 series · 54 of 54 positions shown · non-contrast
Comparison: none

[Series 1: rest sax · 6.4mm · 6.40mm/px · 6 of 23 frames shown]
[frame 2/23]
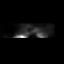
[frame 6/23]
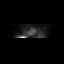
[frame 10/23]
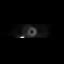
[frame 14/23]
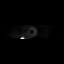
[frame 18/23]
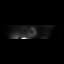
[frame 22/23]
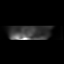

[Series 1: wbr_r-proj_st wbr rest · 6.40mm/px · 6 of 64 frames shown]
[frame 6/64]
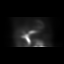
[frame 16/64]
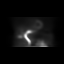
[frame 27/64]
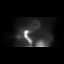
[frame 38/64]
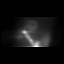
[frame 48/64]
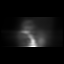
[frame 59/64]
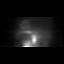

[Series 1: wbr rest · 6.40mm/px · 6 of 64 frames shown]
[frame 6/64]
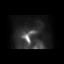
[frame 16/64]
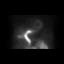
[frame 27/64]
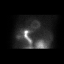
[frame 38/64]
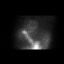
[frame 48/64]
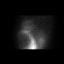
[frame 59/64]
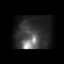

[Series 2: stress sax gs · 6.4mm · 6.40mm/px · 6 of 184 frames shown]
[frame 16/184]
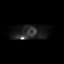
[frame 46/184]
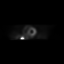
[frame 77/184]
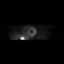
[frame 108/184]
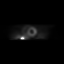
[frame 138/184]
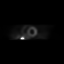
[frame 169/184]
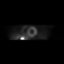

[Series 2: wbr_s-proj_st wbr stress-gsp · 6.40mm/px · 6 of 512 frames shown]
[frame 43/512]
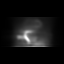
[frame 128/512]
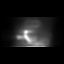
[frame 214/512]
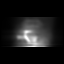
[frame 299/512]
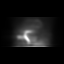
[frame 384/512]
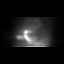
[frame 470/512]
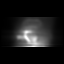

[Series 2: wbr stress-gsp · 6.40mm/px · 6 of 512 frames shown]
[frame 43/512]
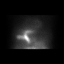
[frame 128/512]
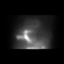
[frame 214/512]
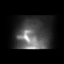
[frame 299/512]
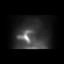
[frame 384/512]
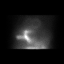
[frame 470/512]
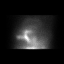

[Series 3: wbr_s-proj_st wbr stress-sum-em · 6.40mm/px · 6 of 64 frames shown]
[frame 6/64]
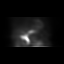
[frame 16/64]
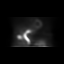
[frame 27/64]
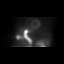
[frame 38/64]
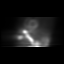
[frame 48/64]
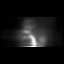
[frame 59/64]
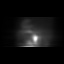

[Series 3: stress sax · 6.4mm · 6.40mm/px · 6 of 23 frames shown]
[frame 2/23]
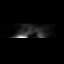
[frame 6/23]
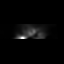
[frame 10/23]
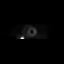
[frame 14/23]
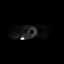
[frame 18/23]
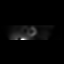
[frame 22/23]
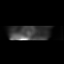

[Series 3: wbr stress-sum-em · 6.40mm/px · 6 of 64 frames shown]
[frame 6/64]
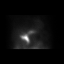
[frame 16/64]
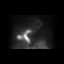
[frame 27/64]
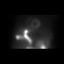
[frame 38/64]
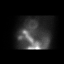
[frame 48/64]
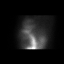
[frame 59/64]
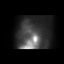

[54 of 54 positions shown; findings below may reference images not displayed]

Canned report from images found in remote index.

Refer to host system for actual result text.

## 2018-01-05 MED ORDER — REGADENOSON 0.4 MG/5ML IV SOLN
0.4000 mg | Freq: Once | INTRAVENOUS | Status: AC
  Administered 2018-01-05: 0.4 mg via INTRAVENOUS

## 2018-01-05 MED ORDER — TECHNETIUM TC 99M TETROFOSMIN IV KIT
27.2000 | PACK | Freq: Once | INTRAVENOUS | Status: AC | PRN
  Administered 2018-01-05: 27.2 via INTRAVENOUS
  Filled 2018-01-05: qty 28

## 2018-01-05 MED ORDER — TECHNETIUM TC 99M TETROFOSMIN IV KIT
8.0000 | PACK | Freq: Once | INTRAVENOUS | Status: AC | PRN
  Administered 2018-01-05: 8 via INTRAVENOUS
  Filled 2018-01-05: qty 8

## 2018-01-06 ENCOUNTER — Other Ambulatory Visit: Payer: Self-pay

## 2018-01-06 ENCOUNTER — Encounter: Payer: Self-pay | Admitting: Family

## 2018-01-06 ENCOUNTER — Telehealth: Payer: Self-pay

## 2018-01-06 ENCOUNTER — Ambulatory Visit (HOSPITAL_COMMUNITY)
Admission: RE | Admit: 2018-01-06 | Discharge: 2018-01-06 | Disposition: A | Discharge status: Home / Self Care | Payer: Medicare Other | Source: Ambulatory Visit | Attending: Family | Admitting: Family

## 2018-01-06 ENCOUNTER — Encounter: Payer: Self-pay | Admitting: Cardiovascular Disease

## 2018-01-06 ENCOUNTER — Telehealth: Payer: Self-pay | Admitting: *Deleted

## 2018-01-06 ENCOUNTER — Ambulatory Visit (INDEPENDENT_AMBULATORY_CARE_PROVIDER_SITE_OTHER): Payer: Medicare Other | Admitting: Family

## 2018-01-06 VITALS — BP 141/69 | HR 79 | Temp 97.1°F | Resp 16 | Ht 61.0 in | Wt 129.0 lb

## 2018-01-06 DIAGNOSIS — I779 Disorder of arteries and arterioles, unspecified: Secondary | ICD-10-CM | POA: Diagnosis not present

## 2018-01-06 DIAGNOSIS — Z87891 Personal history of nicotine dependence: Secondary | ICD-10-CM | POA: Diagnosis not present

## 2018-01-06 DIAGNOSIS — Z9862 Peripheral vascular angioplasty status: Secondary | ICD-10-CM | POA: Diagnosis not present

## 2018-01-06 DIAGNOSIS — I6523 Occlusion and stenosis of bilateral carotid arteries: Secondary | ICD-10-CM

## 2018-01-06 DIAGNOSIS — I6522 Occlusion and stenosis of left carotid artery: Secondary | ICD-10-CM | POA: Diagnosis not present

## 2018-01-06 DIAGNOSIS — Z720 Tobacco use: Secondary | ICD-10-CM

## 2018-01-06 DIAGNOSIS — I6521 Occlusion and stenosis of right carotid artery: Secondary | ICD-10-CM

## 2018-01-06 NOTE — Telephone Encounter (Signed)
Call to number given for Amy at [facility]. No answer, Mailbox full.

## 2018-01-06 NOTE — Patient Instructions (Addendum)
What You Need to Know About Smokeless Tobacco Use Tobacco use is one of the leading causes of cancer and other chronic health problems. Smokeless tobacco is tobacco that is put directly into the mouth instead of being smoked. It may also be called chewing tobacco or snuff. Smokeless tobacco is made from the leaves of tobacco plants and it comes in several forms:  Loose, dry leaves, plugs, or twists.  Moist pouches.  Dissolving lozenges or strips.  Chewing, sucking, or holding the tobacco in your mouth causes your mouth to make more saliva. The saliva mixes with the tobacco to make "tobacco juice" that is swallowed or spit out. How can smokeless tobacco affect me? Using smokeless tobacco:  Increases your risk of developing cancer. Smokeless tobacco contains at least 28 different types of cancer-causing chemicals (carcinogens).  Increases your chances of developing other long-term health problems, including high blood pressure, heart disease, stroke, and dental problems.  Can make you become addicted. Nicotine is one of the chemicals in tobacco. When you chew tobacco, you absorb nicotine from the tobacco juice. This can make you feel more alert than usual.  Can cause problems with pregnancy. Pregnant women who use smokeless tobacco are more likely to miscarry or deliver a baby too early (premature delivery).  Can affect the appearance and health of your mouth. Using smokeless tobacco may cause bad breath, yellow-brown teeth, mouth sores, cracking and bleeding lips, gum recession, and lesions on the soft tissues of your mouth (leukoplakia).  What are the benefits of not using smokeless tobacco? The benefits of not using smokeless tobacco include:  A healthy mind because: ? You avoid addiction.  A healthy body because: ? You avoid dental problems. ? You promote healthy pregnancy. ? You avoid long-term health problems.  A healthy wallet because: ? You avoid costs of buying  tobacco. ? You avoid health care costs in the future.  A healthy family because: ? You avoid accidental poisoning of children in your household.  What can happen if I continue to use smokeless tobacco? If you continue to use smokeless tobacco, you will increase your risk for developing certain cancers. These include:  Tongue.  Lips, mouth, and gums.  Throat (esophagus) and voice box (larynx).  Stomach.  Pancreas.  Bladder.  Colon.  Long-term use of smokeless tobacco can also lead to:  High blood pressure, heart disease, and stroke.  Gum disease, gum recession, and bone loss around the teeth.  Tooth decay.  How do I quit using smokeless tobacco? Quitting the use of smokeless tobacco can be hard, but it can be done. Follow these steps:  Pick a date to quit. Set a date within the next two weeks. This gives you time to prepare.  Write down the reasons why you are quitting. Keep this list in places where you will see it often, such as on your bathroom mirror or in your car or wallet.  Identify the people, places, things, and activities that make you want to use tobacco (triggers) and avoid them.  Get rid of any tobacco you have and remove any tobacco smells. To do this: ? Throw away all containers of tobacco at home, at work, and in your car. ? Throw away any other items that you use regularly when you chew tobacco. ? Clean your car and make sure to remove all tobacco-related items. ? Clean your home, including curtains and carpets.  Tell your family, friends, and coworkers that you are quitting. This can make quitting   easier.  Ask your health care provider for help quitting smokeless tobacco. This may involve treatment. Find out what treatment options are covered by your health insurance.  Keep track of how many days have passed since you quit. Remembering how long and hard you have worked to quit can help you avoid using tobacco again.  Where can I get support? Ask  your health care provider if there is a local support group for quitting smokeless tobacco. Where can I get more information? You can learn more about the risks of using smokeless tobacco and the benefits of quitting from these sources:  National Cancer Institute: www.cancer.gov  American Cancer Society: www.cancer.org  When should I seek medical care? Seek medical care if you have:  White or other discolored patches in your mouth.  Difficulty swallowing.  A change in your voice.  Unexplained weight loss.  Stomach pain, nausea, or vomiting.  Summary  Smokeless tobacco contains at least 28 different chemicals that are known to cause cancer (carcinogen).  Nicotine is an addictive chemical in smokeless tobacco.  When you quit using smokeless tobacco, you lower your risk of developing cancer. This information is not intended to replace advice given to you by your health care provider. Make sure you discuss any questions you have with your health care provider. Document Released: 01/12/2011 Document Revised: 04/04/2016 Document Reviewed: 03/22/2015 Elsevier Interactive Patient Education  2018 [employer].    Stroke Prevention Some health problems and behaviors may make it more likely for you to have a stroke. Below are ways to lessen your risk of having a stroke.  Be active for at least 30 minutes on most or all days.  Do not smoke. Try not to be around others who smoke.  Do not drink too much alcohol. ? Do not have more than 2 drinks a day if you are a man. ? Do not have more than 1 drink a day if you are a woman and are not pregnant.  Eat healthy foods, such as fruits and vegetables. If you were put on a specific diet, follow the diet as told.  Keep your cholesterol levels under control through diet and medicines. Look for foods that are low in saturated fat, trans fat, cholesterol, and are high in fiber.  If you have diabetes, follow all diet plans and take your  medicine as told.  Ask your doctor if you need treatment to lower your blood pressure. If you have high blood pressure (hypertension), follow all diet plans and take your medicine as told by your doctor.  If you are 24-13 years old, have your blood pressure checked every 3-5 years. If you are age 13 or older, have your blood pressure checked every year.  Keep a healthy weight. Eat foods that are low in calories, salt, saturated fat, trans fat, and cholesterol.  Do not take drugs.  Avoid birth control pills, if this applies. Talk to your doctor about the risks of taking birth control pills.  Talk to your doctor if you have sleep problems (sleep apnea).  Take all medicine as told by your doctor. ? You may be told to take aspirin or blood thinner medicine. Take this medicine as told by your doctor. ? Understand your medicine instructions.  Make sure any other conditions you have are being taken care of.  Get help right away if:  You suddenly lose feeling (you feel numb) or have weakness in your face, arm, or leg.  Your face or eyelid hangs  down to one side.  You suddenly feel confused.  You have trouble talking (aphasia) or understanding what people are saying.  You suddenly have trouble seeing in one or both eyes.  You suddenly have trouble walking.  You are dizzy.  You lose your balance or your movements are clumsy (uncoordinated).  You suddenly have a very bad headache and you do not know the cause.  You have new chest pain.  Your heart feels like it is fluttering or skipping a beat (irregular heartbeat). Do not wait to see if the symptoms above go away. Get help right away. Call your local emergency services (911 in [place] Do not drive yourself to the hospital. This information is not intended to replace advice given to you by your health care provider. Make sure you discuss any questions you have with your health care provider. Document Released: 02/09/2012 Document  Revised: 01/16/2016 Document Reviewed: 02/10/2013 Elsevier Interactive Patient Education  2018 [employer] Inc[employer]     Venous Ulcer A venous ulcer is a shallow sore on your lower leg. It is caused by poor circulation in your veins. Venous ulcer is the most common type of lower leg ulcer. You may have venous ulcers on one leg or on both legs. This condition most often develops around your ankles. This type of ulcer may last for a long time (chronic ulcer) or it may return often (recurrent ulcer). Follow these instructions at home: Wound care  Follow instructions from your doctor about: ? How to take care of your wound. ? When and how you should change your bandage (dressing). ? When you should remove your bandage. If your bandage is dry and gets stuck to your leg when you try to remove it, moisten or wet the bandage with saline solution or water. This helps you to remove it without harming your skin or wound.  Check your wound every day for signs of infection. Have a caregiver do this for you if you are not able to do it yourself. Watch for: ? More redness, swelling, or pain. ? More fluid or blood. ? Pus, warmth, or a bad smell. Medicines  Take over-the-counter and prescription medicines only as told by your doctor.  If you were prescribed an antibiotic medicine, take it or apply it as told by your doctor. Do not stop taking or using the antibiotic even if your condition improves. Activity  Do not stand or sit in one position for a long period of time. Rest with your legs raised during the day. If possible, keep your legs above your heart for 30 minutes, 3-4 times a day, or as told by your doctor.  Do not sit with your legs crossed.  Walk often to increase the blood flow in your legs. Ask your doctor what level of activity is safe for you.  If you are taking a long ride in a car or plane, take a break to walk around at least once every two hours, or as told by your doctor. Ask your doctor  if you should take aspirin before long trips. General instructions   Wear elastic stockings, compression stockings, or support hose as told by your doctor. This is very important.  Raise the foot of your bed as told by your doctor.  Do not smoke.  Keep all follow-up visits as told by your doctor. This is important. Contact a doctor if:  You have a fever.  Your ulcer is getting larger or is not healing.  Your pain gets  worse.  You have more redness or swelling around your ulcer.  You have more fluid, blood, or pus coming from your ulcer after it has been cleaned by you or your doctor.  You have warmth or a bad smell coming from your ulcer. This information is not intended to replace advice given to you by your health care provider. Make sure you discuss any questions you have with your health care provider. Document Released: 09/17/2004 Document Revised: 01/16/2016 Document Reviewed: 12/19/2014 Elsevier Interactive Patient Education  [employer].

## 2018-01-06 NOTE — Progress Notes (Signed)
Chief Complaint: Follow up Extracranial Carotid Artery Stenosis   History of Present Illness  Reginald Ayers is a 67 y.o. male who Dr. Myra Gianotti initially saw in January 2018 for a second opinion regarding ulcers in his right leg.  He is s/p drug coated balloon angioplasty ofright superficial femoral artery, and balloon angioplastyofright common femoral artery on 04-20-17 by Dr. Myra Gianotti forin-stent stenosis.  He also has known left carotid occlusion in the setting of moderate right-sided stenosis.  09-07-17 aortogram by Dr. Myra Gianotti: No significant renal artery stenosis.  The infrarenal abdominal aorta is patent throughout its course.  Bilateral common and external iliac arteries are patent; patent right superficial femoral artery stent; flush occlusion of left superficial femoral artery. No intervention  Previously, Dr. Myra Gianotti was able to recanalize his occluded right superficial femoral artery stent and place aViabahn. (09/22/2016). On 12/08/2016 he underwent atherectomy and angioplasty of a 80% right posterior tibial artery stenosis. On 04/20/2017 he was found to have ostial stenosis of the stent within his right superficial femoral artery. This was treated with drug-coated balloon angioplasty.  He had a CABG in 2010 that included LIMA to LAD. Left radial artery was used as one of the a bypass grafts.  He had a stress test done 01-05-18 at Dr. Hazle Coca office; pt states that there is no change since the previous stress test.   He is treated at the wound care center at [facility] every other week, states HH visits twice/week for medicated compression dressing changes to right lower leg.  He reports left shoulder rotator cuff issue, is unable to raise his left arm above his left shoulder.   He states he was exposed to [employer] in [place], states he was told by the [place] that his DM and vascular disease was facilitated by this.  Pt Diabetic:Yes, A1C was 6.7 on 09-25-17 Pt  smoker:formersmoker, quit in 2016, started at age 76, but he uses smokeless tobacco, 1 can every 2-3 days.  Pt meds include: Statin :Yes Betablocker:yes ASA:no Other anticoagulants/antiplatelets:Plavix   Past Medical History:  Diagnosis Date  . Carotid artery disease (HCC)    L-ICA 100%, mod R-ICA dz  . COPD (chronic obstructive pulmonary disease) (HCC)   . Coronary artery disease    s/p CABG February 2010 by Dr. Andrey Spearman  . Diabetes (HCC)   . Emphysema lung (HCC)   . Hyperlipidemia   . Hypertension   . Peripheral arterial disease (HCC)    post left common iliac and right SFA stenting remotely  . Tobacco abuse         Social History Social History   Tobacco Use  . Smoking status: Light Tobacco Smoker    Packs/day: 0.00    Types: Cigarettes    Last attempt to quit: 08/07/2016    Years since quitting: 1.4  . Smokeless tobacco: Current User    Types: Chew  Substance Use Topics  . Alcohol use: No    Alcohol/week: 0.0 oz    Comment: Weekend drinker, quit 2000. NIGHTLY nyquill  . Drug use: No    Family History Family History  Problem Relation Age of Onset  . Heart attack Mother   . CVA Mother   . Heart disease Mother   . Diabetes Sister   . Hypertension Sister     Surgical History Past Surgical History:  Procedure Laterality Date  . ABDOMINAL AORTOGRAM N/A 04/20/2017   Procedure: ABDOMINAL AORTOGRAM;  Surgeon: Nada Libman, MD;  Location: [facility] INVASIVE CV  LAB;  Service: Cardiovascular;  Laterality: N/A;  . ABDOMINAL AORTOGRAM W/LOWER EXTREMITY Right 12/08/2016   Procedure: Abdominal Aortogram w/Lower Extremity;  Surgeon: Nada Libman, MD;  Location: [facility] INVASIVE CV LAB;  Service: Cardiovascular;  Laterality: Right;  . ABDOMINAL AORTOGRAM W/LOWER EXTREMITY N/A 09/07/2017   Procedure: ABDOMINAL AORTOGRAM W/LOWER EXTREMITY;  Surgeon: Nada Libman, MD;  Location: [facility] INVASIVE CV LAB;  Service: Cardiovascular;  Laterality: N/A;  . CARDIAC  CATHETERIZATION  2010  . CAROTID ANGIOGRAM  2014  . CORONARY ARTERY BYPASS GRAFT  2010   LIMA-LAD, Lrad-OM1, SVG-RI, SVG-AM-dRCA  . DOPPLER ECHOCARDIOGRAPHY  2010  . LOWER EXTREMITY ANGIOGRAM  11/2006   left common femoral endarterectomy and patch angioplasty:The mid right SFA was angioplastied with a 4 x 8  . LOWER EXTREMITY ANGIOGRAM  2003   left common iliac artery stenting by Dr. Erlene Quan  . LOWER EXTREMITY ANGIOGRAPHY Right 04/20/2017   Procedure: Lower Extremity Angiography;  Surgeon: Nada Libman, MD;  Location: [facility] INVASIVE CV LAB;  Service: Cardiovascular;  Laterality: Right;  . NM MYOVIEW LTD  2015  . PERIPHERAL VASCULAR ATHERECTOMY Right 12/08/2016   Procedure: Peripheral Vascular Atherectomy;  Surgeon: Nada Libman, MD;  Location: [facility] INVASIVE CV LAB;  Service: Cardiovascular;  Laterality: Right;  . PERIPHERAL VASCULAR BALLOON ANGIOPLASTY Right 12/08/2016   Procedure: Peripheral Vascular Balloon Angioplasty;  Surgeon: Nada Libman, MD;  Location: [facility] INVASIVE CV LAB;  Service: Cardiovascular;  Laterality: Right;  . PERIPHERAL VASCULAR BALLOON ANGIOPLASTY Right 04/20/2017   Procedure: PERIPHERAL VASCULAR BALLOON ANGIOPLASTY;  Surgeon: Nada Libman, MD;  Location: [facility] INVASIVE CV LAB;  Service: Cardiovascular;  Laterality: Right;  SFA  . PERIPHERAL VASCULAR CATHETERIZATION N/A 09/22/2016   Procedure: Abdominal Aortogram w/ bilateral Lower Extremity Runoff;  Surgeon: Nada Libman, MD;  Location: [facility] INVASIVE CV LAB;  Service: Cardiovascular;  Laterality: N/A;  . PERIPHERAL VASCULAR CATHETERIZATION Right 09/22/2016   Procedure: Peripheral Vascular Intervention;  Surgeon: Nada Libman, MD;  Location: [facility] INVASIVE CV LAB;  Service: Cardiovascular;  Laterality: Right;    Allergies  Allergen Reactions  . Actos [Pioglitazone] Shortness Of Breath    Leg swelling   . Lisinopril Cough    Current Outpatient Medications  Medication Sig Dispense Refill  . albuterol (PROVENTIL)  (2.5 MG/3ML) 0.083% nebulizer solution U 3 ML VIA NEB TID PRN  3  . B Complex Vitamins (B COMPLEX PO) Take 1 tablet by mouth 2 (two) times a week.     . Benzocaine (BOIL-EASE EX) Apply 1 application topically daily as needed (BOIL).    Marland Kitchen clopidogrel (PLAVIX) 75 MG tablet Take 75 mg by mouth daily.     . Cyanocobalamin (VITAMIN B-12 IJ) Inject 1 Dose as directed every 30 (thirty) days.     . furosemide (LASIX) 40 MG tablet Take 40 mg by mouth daily as needed for edema.    Marland Kitchen glipiZIDE (GLUCOTROL XL) 10 MG 24 hr tablet Take 10 mg by mouth 2 (two) times daily.     . Homeopathic Products (LEG CRAMP RELIEF SL) Place 2 tablets under the tongue at bedtime as needed (leg cramps).     Marland Kitchen ibuprofen (ADVIL,MOTRIN) 200 MG tablet Take 800 mg by mouth every 8 (eight) hours as needed for moderate pain.    Marland Kitchen irbesartan (AVAPRO) 300 MG tablet Take 300 mg by mouth daily.     . IRON PO Take 1 tablet by mouth 4 (four) times a week.     . metFORMIN (  GLUCOPHAGE) 500 MG tablet Take 1,000 mg by mouth 2 (two) times daily with a meal.     . metoprolol succinate (TOPROL-XL) 25 MG 24 hr tablet Take 1 tablet (25 mg total) by mouth daily. 30 tablet 6  . NITROSTAT 0.4 MG SL tablet Take 0.4 mg by mouth every 5 (five) minutes as needed for chest pain.   0  . Polyvinyl Alcohol-Povidone (MURINE TEARS FOR DRY EYES OP) Apply 1 drop to eye 3 (three) times daily as needed (dry eyes).    . potassium chloride (K-DUR) 10 MEQ tablet Take 10 mEq by mouth daily.    . Pseudoeph-Doxylamine-DM-APAP (NYQUIL PO) Take 1 Dose by mouth at bedtime.     . simvastatin (ZOCOR) 40 MG tablet Take 40 mg by mouth every evening.    Marland Kitchen SPIRIVA HANDIHALER 18 MCG inhalation capsule Place 18 mcg into inhaler and inhale daily at 8 pm.     . traMADol (ULTRAM) 50 MG tablet Take 50 mg by mouth 3 (three) times daily as needed for severe pain.      No current facility-administered medications for this visit.     Review of Systems : See HPI for pertinent positives  and negatives.  Physical Examination  Vitals:   01/06/18 1351 01/06/18 1356 01/06/18 1358  BP: (!) 154/68 (!) 162/88 (!) 141/69  Pulse: 79 79 79  Resp: 16    Temp: (!) 97.1 F (36.2 C)    TempSrc: Oral    SpO2: 100%    Weight: 129 lb (58.5 kg)    Height:  (1.549 m)     Body mass index is 24.37 kg/m.  General: A&O x 3, WDWN,small male. Gait:normal HENT: no gross abnormalities, edentulous  Eyes: PERRLA. Pulmonary: Respirations are non labored, CTAB,fairair movement Cardiac:regularrhythm and rate, no detected murmur.    Carotid Bruits Right Left   Negative Negative   Radial pulses: right is 2+ palpable, left radial artery was harvested for use in CABG, left brachial pulse is 1+ palpable. Adominal aortic pulse isnotpalpable   VASCULAR EXAM: Extremitieswithoutischemic changes, withoutGangrene; with venous stasis ulcer at dorsal aspect right ankle with medicated compression dressing in place at right calf and ankle; applied by the wound care center;  right toes are fairly pink.  No open wounds on left leg. Shiny skin at left lower leg Trace pretibial pitting edema left leg;      LE Pulses Right Left  FEMORAL notpalpable notpalpable   POPLITEAL notpalpable  notpalpable  POSTERIOR TIBIAL notpalpable  notpalpable   DORSALIS PEDIS ANTERIOR TIBIAL notpalpable  notpalpable    Abdomen: soft, NT, nopalpablemasses. Skin: no rashes,see Extremities. Musculoskeletal: no muscle wasting or atrophy. Neurologic: A&O X 3; Appropriate Affect ; SENSATION: normal; MOTOR FUNCTION: moving all extremities equally, motor strength 5/5 throughout, unable to raise left arm over about 15 degrees. Speech is fluent/normal/loud/edentulous. CN 2-12 intact Psychiatric: Thought content is normal, mood appropriate for clinical situation    Assessment: SEABRON IANNELLO is a 67 y.o. male  whois s/p drug coated balloon angioplasty ofright superficial femoral artery, and balloon angioplastyofright common femoral artery on 04-20-17 by Dr. Myra Gianotti forin-stent stenosis. The patienthaspreviously undergone percutaneous revascularization.   Bilateral femoral pulses are not palpable, but pt does not sem to have claudication sx's with walking; bilateral EIA and CIA were patent on January 2019 aortogram.  Will duplex aortoiliacs on his return.  Venous stasis ulcers right lower leg under treatment by [facility] wound care center and HH, slowly healing.  DATA  Carotid Duplex (01/06/18); Right ICA: 60-79% Left ICA: occluded Left ECA: >50% stenosis Bilateral vertebral artery flow is antegrade.  Bilateral subclavian artery waveforms are normal.  No change compared to the exam on 04-15-17.   Right LE Arterial Duplex (12/21/17): Patent right leg stent with no focal stenosis. Irregular plaque is seen in the popliteal artery with no focal stenosis.  Waveforms are triphasic proximal to stent, biphasic at proximal stent, and monophasic to distal stent.  Highest PSV is at the DFA (278 cm/s).   ABI (Date: 12/21/2017):  R:  ? ABI: 0.70 (was 0.60 on 07-20-17),  ? PT: mono ? DP: mono ? TBI:  0.30, toe pressure of 47 (was 0.24)  L:  ? ABI: 0.57 (was 0.44),  ? PT: mono ? DP: mono ? TBI: 0.29, toe pressure of 46 (was 0.28) ? Slightly improved bilateral ABI and right TBI, stable left TBI; moderate disease bilaterally with all monophasic waveforms.    PLAN:  The patient was counseled re smokeless tobacco cessation and given several free resources re this.  Based on the patient's vascular studies and examination, pt will return to clinic in six months with right LE arterial duplex, bilateral aortoiliac duplex, ABI's., and carotid duplex.  I advised him to notify us if he develops concerns re the circulation in his feet or legs.    Continue venous stasis ulcers treatment by  [facility] wound care cent er and HH.   I discussed in depth with the patient the nature of atherosclerosis, and emphasized the importance of maximal medical management including strict control of blood pressure, blood glucose, and lipid levels, obtaining regular exercise, and cessation of tobacco use.  The patient is aware that without maximal medical management the underlying atherosclerotic disease process will progress, limiting the benefit of any interventions. The patient was given information about stroke prevention and what symptoms should prompt the patient to seek immediate medical care. Thank you for allowing [place] to participate in this patient's care.  Charisse March, RN, MSN, FNP-C [facility] Office: [phone]  Clinic Physician: Darrick Penna  01/06/18 2:43 PM

## 2018-01-06 NOTE — Progress Notes (Signed)
Vitals:   01/06/18 1351 01/06/18 1356  BP: (!) 154/68 (!) 162/88  Pulse: 79 79  Resp: 16   Temp: (!) 97.1 F (36.2 C)   TempSrc: Oral   SpO2: 100%   Weight: 129 lb (58.5 kg)   Height:  (1.549 m)

## 2018-01-06 NOTE — Telephone Encounter (Signed)
Pt is calling  Returning call to nurse. Please call pt

## 2018-01-06 NOTE — Telephone Encounter (Signed)
This encounter was created in error - please disregard.

## 2018-01-07 DIAGNOSIS — I2510 Atherosclerotic heart disease of native coronary artery without angina pectoris: Secondary | ICD-10-CM | POA: Diagnosis not present

## 2018-01-07 DIAGNOSIS — L89322 Pressure ulcer of left buttock, stage 2: Secondary | ICD-10-CM | POA: Diagnosis not present

## 2018-01-07 DIAGNOSIS — E11622 Type 2 diabetes mellitus with other skin ulcer: Secondary | ICD-10-CM | POA: Diagnosis not present

## 2018-01-07 DIAGNOSIS — E1151 Type 2 diabetes mellitus with diabetic peripheral angiopathy without gangrene: Secondary | ICD-10-CM | POA: Diagnosis not present

## 2018-01-07 DIAGNOSIS — J439 Emphysema, unspecified: Secondary | ICD-10-CM | POA: Diagnosis not present

## 2018-01-07 DIAGNOSIS — L97312 Non-pressure chronic ulcer of right ankle with fat layer exposed: Secondary | ICD-10-CM | POA: Diagnosis not present

## 2018-01-10 DIAGNOSIS — L97312 Non-pressure chronic ulcer of right ankle with fat layer exposed: Secondary | ICD-10-CM | POA: Diagnosis not present

## 2018-01-10 DIAGNOSIS — E11622 Type 2 diabetes mellitus with other skin ulcer: Secondary | ICD-10-CM | POA: Diagnosis not present

## 2018-01-10 DIAGNOSIS — L89322 Pressure ulcer of left buttock, stage 2: Secondary | ICD-10-CM | POA: Diagnosis not present

## 2018-01-10 DIAGNOSIS — I2510 Atherosclerotic heart disease of native coronary artery without angina pectoris: Secondary | ICD-10-CM | POA: Diagnosis not present

## 2018-01-10 DIAGNOSIS — J439 Emphysema, unspecified: Secondary | ICD-10-CM | POA: Diagnosis not present

## 2018-01-10 DIAGNOSIS — E1151 Type 2 diabetes mellitus with diabetic peripheral angiopathy without gangrene: Secondary | ICD-10-CM | POA: Diagnosis not present

## 2018-01-13 DIAGNOSIS — I2510 Atherosclerotic heart disease of native coronary artery without angina pectoris: Secondary | ICD-10-CM | POA: Diagnosis not present

## 2018-01-13 DIAGNOSIS — L89322 Pressure ulcer of left buttock, stage 2: Secondary | ICD-10-CM | POA: Diagnosis not present

## 2018-01-13 DIAGNOSIS — J439 Emphysema, unspecified: Secondary | ICD-10-CM | POA: Diagnosis not present

## 2018-01-13 DIAGNOSIS — L97512 Non-pressure chronic ulcer of other part of right foot with fat layer exposed: Secondary | ICD-10-CM | POA: Diagnosis not present

## 2018-01-13 DIAGNOSIS — E11622 Type 2 diabetes mellitus with other skin ulcer: Secondary | ICD-10-CM | POA: Diagnosis not present

## 2018-01-13 DIAGNOSIS — J449 Chronic obstructive pulmonary disease, unspecified: Secondary | ICD-10-CM | POA: Diagnosis not present

## 2018-01-13 DIAGNOSIS — E11621 Type 2 diabetes mellitus with foot ulcer: Secondary | ICD-10-CM | POA: Diagnosis not present

## 2018-01-13 DIAGNOSIS — E1151 Type 2 diabetes mellitus with diabetic peripheral angiopathy without gangrene: Secondary | ICD-10-CM | POA: Diagnosis not present

## 2018-01-13 DIAGNOSIS — L97312 Non-pressure chronic ulcer of right ankle with fat layer exposed: Secondary | ICD-10-CM | POA: Diagnosis not present

## 2018-01-13 DIAGNOSIS — I998 Other disorder of circulatory system: Secondary | ICD-10-CM | POA: Diagnosis not present

## 2018-01-13 DIAGNOSIS — I10 Essential (primary) hypertension: Secondary | ICD-10-CM | POA: Diagnosis not present

## 2018-01-16 DIAGNOSIS — Z7984 Long term (current) use of oral hypoglycemic drugs: Secondary | ICD-10-CM | POA: Diagnosis not present

## 2018-01-16 DIAGNOSIS — Z7902 Long term (current) use of antithrombotics/antiplatelets: Secondary | ICD-10-CM | POA: Diagnosis not present

## 2018-01-16 DIAGNOSIS — Z720 Tobacco use: Secondary | ICD-10-CM | POA: Diagnosis not present

## 2018-01-16 DIAGNOSIS — L97312 Non-pressure chronic ulcer of right ankle with fat layer exposed: Secondary | ICD-10-CM | POA: Diagnosis not present

## 2018-01-16 DIAGNOSIS — Z951 Presence of aortocoronary bypass graft: Secondary | ICD-10-CM | POA: Diagnosis not present

## 2018-01-16 DIAGNOSIS — I10 Essential (primary) hypertension: Secondary | ICD-10-CM | POA: Diagnosis not present

## 2018-01-16 DIAGNOSIS — E11622 Type 2 diabetes mellitus with other skin ulcer: Secondary | ICD-10-CM | POA: Diagnosis not present

## 2018-01-16 DIAGNOSIS — E1151 Type 2 diabetes mellitus with diabetic peripheral angiopathy without gangrene: Secondary | ICD-10-CM | POA: Diagnosis not present

## 2018-01-16 DIAGNOSIS — J439 Emphysema, unspecified: Secondary | ICD-10-CM | POA: Diagnosis not present

## 2018-01-16 DIAGNOSIS — Z8701 Personal history of pneumonia (recurrent): Secondary | ICD-10-CM | POA: Diagnosis not present

## 2018-01-16 DIAGNOSIS — Z4800 Encounter for change or removal of nonsurgical wound dressing: Secondary | ICD-10-CM | POA: Diagnosis not present

## 2018-01-17 DIAGNOSIS — L97312 Non-pressure chronic ulcer of right ankle with fat layer exposed: Secondary | ICD-10-CM | POA: Diagnosis not present

## 2018-01-17 DIAGNOSIS — E11622 Type 2 diabetes mellitus with other skin ulcer: Secondary | ICD-10-CM | POA: Diagnosis not present

## 2018-01-17 DIAGNOSIS — J439 Emphysema, unspecified: Secondary | ICD-10-CM | POA: Diagnosis not present

## 2018-01-17 DIAGNOSIS — E1151 Type 2 diabetes mellitus with diabetic peripheral angiopathy without gangrene: Secondary | ICD-10-CM | POA: Diagnosis not present

## 2018-01-17 DIAGNOSIS — Z4800 Encounter for change or removal of nonsurgical wound dressing: Secondary | ICD-10-CM | POA: Diagnosis not present

## 2018-01-17 DIAGNOSIS — I10 Essential (primary) hypertension: Secondary | ICD-10-CM | POA: Diagnosis not present

## 2018-01-20 DIAGNOSIS — E1151 Type 2 diabetes mellitus with diabetic peripheral angiopathy without gangrene: Secondary | ICD-10-CM | POA: Diagnosis not present

## 2018-01-20 DIAGNOSIS — L97312 Non-pressure chronic ulcer of right ankle with fat layer exposed: Secondary | ICD-10-CM | POA: Diagnosis not present

## 2018-01-20 DIAGNOSIS — Z4800 Encounter for change or removal of nonsurgical wound dressing: Secondary | ICD-10-CM | POA: Diagnosis not present

## 2018-01-20 DIAGNOSIS — E11622 Type 2 diabetes mellitus with other skin ulcer: Secondary | ICD-10-CM | POA: Diagnosis not present

## 2018-01-20 DIAGNOSIS — J439 Emphysema, unspecified: Secondary | ICD-10-CM | POA: Diagnosis not present

## 2018-01-20 DIAGNOSIS — I10 Essential (primary) hypertension: Secondary | ICD-10-CM | POA: Diagnosis not present

## 2018-01-24 DIAGNOSIS — E1151 Type 2 diabetes mellitus with diabetic peripheral angiopathy without gangrene: Secondary | ICD-10-CM | POA: Diagnosis not present

## 2018-01-24 DIAGNOSIS — E11622 Type 2 diabetes mellitus with other skin ulcer: Secondary | ICD-10-CM | POA: Diagnosis not present

## 2018-01-24 DIAGNOSIS — Z4800 Encounter for change or removal of nonsurgical wound dressing: Secondary | ICD-10-CM | POA: Diagnosis not present

## 2018-01-24 DIAGNOSIS — J439 Emphysema, unspecified: Secondary | ICD-10-CM | POA: Diagnosis not present

## 2018-01-24 DIAGNOSIS — L97312 Non-pressure chronic ulcer of right ankle with fat layer exposed: Secondary | ICD-10-CM | POA: Diagnosis not present

## 2018-01-24 DIAGNOSIS — I10 Essential (primary) hypertension: Secondary | ICD-10-CM | POA: Diagnosis not present

## 2018-01-27 ENCOUNTER — Encounter (HOSPITAL_BASED_OUTPATIENT_CLINIC_OR_DEPARTMENT_OTHER): Payer: Medicare Other | Attending: Internal Medicine

## 2018-01-27 DIAGNOSIS — I10 Essential (primary) hypertension: Secondary | ICD-10-CM | POA: Insufficient documentation

## 2018-01-27 DIAGNOSIS — J449 Chronic obstructive pulmonary disease, unspecified: Secondary | ICD-10-CM | POA: Insufficient documentation

## 2018-01-27 DIAGNOSIS — I2510 Atherosclerotic heart disease of native coronary artery without angina pectoris: Secondary | ICD-10-CM | POA: Insufficient documentation

## 2018-01-27 DIAGNOSIS — E1151 Type 2 diabetes mellitus with diabetic peripheral angiopathy without gangrene: Secondary | ICD-10-CM | POA: Diagnosis not present

## 2018-01-27 DIAGNOSIS — L97512 Non-pressure chronic ulcer of other part of right foot with fat layer exposed: Secondary | ICD-10-CM | POA: Insufficient documentation

## 2018-01-27 DIAGNOSIS — Z9221 Personal history of antineoplastic chemotherapy: Secondary | ICD-10-CM | POA: Insufficient documentation

## 2018-01-27 DIAGNOSIS — S91301A Unspecified open wound, right foot, initial encounter: Secondary | ICD-10-CM | POA: Diagnosis not present

## 2018-01-27 DIAGNOSIS — E11621 Type 2 diabetes mellitus with foot ulcer: Secondary | ICD-10-CM | POA: Insufficient documentation

## 2018-01-31 DIAGNOSIS — E1151 Type 2 diabetes mellitus with diabetic peripheral angiopathy without gangrene: Secondary | ICD-10-CM | POA: Diagnosis not present

## 2018-01-31 DIAGNOSIS — E11622 Type 2 diabetes mellitus with other skin ulcer: Secondary | ICD-10-CM | POA: Diagnosis not present

## 2018-01-31 DIAGNOSIS — Z4800 Encounter for change or removal of nonsurgical wound dressing: Secondary | ICD-10-CM | POA: Diagnosis not present

## 2018-01-31 DIAGNOSIS — J439 Emphysema, unspecified: Secondary | ICD-10-CM | POA: Diagnosis not present

## 2018-01-31 DIAGNOSIS — I10 Essential (primary) hypertension: Secondary | ICD-10-CM | POA: Diagnosis not present

## 2018-01-31 DIAGNOSIS — L97312 Non-pressure chronic ulcer of right ankle with fat layer exposed: Secondary | ICD-10-CM | POA: Diagnosis not present

## 2018-02-02 ENCOUNTER — Other Ambulatory Visit: Payer: Self-pay

## 2018-02-02 DIAGNOSIS — I739 Peripheral vascular disease, unspecified: Principal | ICD-10-CM

## 2018-02-02 DIAGNOSIS — Z9862 Peripheral vascular angioplasty status: Secondary | ICD-10-CM

## 2018-02-02 DIAGNOSIS — I6523 Occlusion and stenosis of bilateral carotid arteries: Secondary | ICD-10-CM

## 2018-02-02 DIAGNOSIS — I779 Disorder of arteries and arterioles, unspecified: Secondary | ICD-10-CM

## 2018-02-03 ENCOUNTER — Other Ambulatory Visit: Payer: Self-pay

## 2018-02-03 DIAGNOSIS — Z4800 Encounter for change or removal of nonsurgical wound dressing: Secondary | ICD-10-CM | POA: Diagnosis not present

## 2018-02-03 DIAGNOSIS — J439 Emphysema, unspecified: Secondary | ICD-10-CM | POA: Diagnosis not present

## 2018-02-03 DIAGNOSIS — E1151 Type 2 diabetes mellitus with diabetic peripheral angiopathy without gangrene: Secondary | ICD-10-CM | POA: Diagnosis not present

## 2018-02-03 DIAGNOSIS — L97312 Non-pressure chronic ulcer of right ankle with fat layer exposed: Secondary | ICD-10-CM | POA: Diagnosis not present

## 2018-02-03 DIAGNOSIS — I10 Essential (primary) hypertension: Secondary | ICD-10-CM | POA: Diagnosis not present

## 2018-02-03 DIAGNOSIS — I6523 Occlusion and stenosis of bilateral carotid arteries: Secondary | ICD-10-CM

## 2018-02-03 DIAGNOSIS — E11622 Type 2 diabetes mellitus with other skin ulcer: Secondary | ICD-10-CM | POA: Diagnosis not present

## 2018-02-04 DIAGNOSIS — Z125 Encounter for screening for malignant neoplasm of prostate: Secondary | ICD-10-CM | POA: Diagnosis not present

## 2018-02-04 DIAGNOSIS — E1151 Type 2 diabetes mellitus with diabetic peripheral angiopathy without gangrene: Secondary | ICD-10-CM | POA: Diagnosis not present

## 2018-02-04 DIAGNOSIS — Z0001 Encounter for general adult medical examination with abnormal findings: Secondary | ICD-10-CM | POA: Diagnosis not present

## 2018-02-04 DIAGNOSIS — I509 Heart failure, unspecified: Secondary | ICD-10-CM | POA: Diagnosis not present

## 2018-02-04 DIAGNOSIS — Z1389 Encounter for screening for other disorder: Secondary | ICD-10-CM | POA: Diagnosis not present

## 2018-02-04 DIAGNOSIS — Z1211 Encounter for screening for malignant neoplasm of colon: Secondary | ICD-10-CM | POA: Diagnosis not present

## 2018-02-04 DIAGNOSIS — I739 Peripheral vascular disease, unspecified: Secondary | ICD-10-CM | POA: Diagnosis not present

## 2018-02-04 DIAGNOSIS — E7800 Pure hypercholesterolemia, unspecified: Secondary | ICD-10-CM | POA: Diagnosis not present

## 2018-02-04 DIAGNOSIS — I2510 Atherosclerotic heart disease of native coronary artery without angina pectoris: Secondary | ICD-10-CM | POA: Diagnosis not present

## 2018-02-04 DIAGNOSIS — J449 Chronic obstructive pulmonary disease, unspecified: Secondary | ICD-10-CM | POA: Diagnosis not present

## 2018-02-04 DIAGNOSIS — N182 Chronic kidney disease, stage 2 (mild): Secondary | ICD-10-CM | POA: Diagnosis not present

## 2018-02-04 DIAGNOSIS — I10 Essential (primary) hypertension: Secondary | ICD-10-CM | POA: Diagnosis not present

## 2018-02-07 DIAGNOSIS — J439 Emphysema, unspecified: Secondary | ICD-10-CM | POA: Diagnosis not present

## 2018-02-07 DIAGNOSIS — E11622 Type 2 diabetes mellitus with other skin ulcer: Secondary | ICD-10-CM | POA: Diagnosis not present

## 2018-02-07 DIAGNOSIS — I10 Essential (primary) hypertension: Secondary | ICD-10-CM | POA: Diagnosis not present

## 2018-02-07 DIAGNOSIS — Z4800 Encounter for change or removal of nonsurgical wound dressing: Secondary | ICD-10-CM | POA: Diagnosis not present

## 2018-02-07 DIAGNOSIS — L97312 Non-pressure chronic ulcer of right ankle with fat layer exposed: Secondary | ICD-10-CM | POA: Diagnosis not present

## 2018-02-07 DIAGNOSIS — E1151 Type 2 diabetes mellitus with diabetic peripheral angiopathy without gangrene: Secondary | ICD-10-CM | POA: Diagnosis not present

## 2018-02-10 DIAGNOSIS — E1151 Type 2 diabetes mellitus with diabetic peripheral angiopathy without gangrene: Secondary | ICD-10-CM | POA: Diagnosis not present

## 2018-02-10 DIAGNOSIS — I10 Essential (primary) hypertension: Secondary | ICD-10-CM | POA: Diagnosis not present

## 2018-02-10 DIAGNOSIS — E11621 Type 2 diabetes mellitus with foot ulcer: Secondary | ICD-10-CM | POA: Diagnosis not present

## 2018-02-10 DIAGNOSIS — L97512 Non-pressure chronic ulcer of other part of right foot with fat layer exposed: Secondary | ICD-10-CM | POA: Diagnosis not present

## 2018-02-10 DIAGNOSIS — I2510 Atherosclerotic heart disease of native coronary artery without angina pectoris: Secondary | ICD-10-CM | POA: Diagnosis not present

## 2018-02-10 DIAGNOSIS — J449 Chronic obstructive pulmonary disease, unspecified: Secondary | ICD-10-CM | POA: Diagnosis not present

## 2018-02-11 DIAGNOSIS — E039 Hypothyroidism, unspecified: Secondary | ICD-10-CM | POA: Diagnosis not present

## 2018-02-14 DIAGNOSIS — L97312 Non-pressure chronic ulcer of right ankle with fat layer exposed: Secondary | ICD-10-CM | POA: Diagnosis not present

## 2018-02-14 DIAGNOSIS — E11622 Type 2 diabetes mellitus with other skin ulcer: Secondary | ICD-10-CM | POA: Diagnosis not present

## 2018-02-14 DIAGNOSIS — Z4800 Encounter for change or removal of nonsurgical wound dressing: Secondary | ICD-10-CM | POA: Diagnosis not present

## 2018-02-14 DIAGNOSIS — E1151 Type 2 diabetes mellitus with diabetic peripheral angiopathy without gangrene: Secondary | ICD-10-CM | POA: Diagnosis not present

## 2018-02-14 DIAGNOSIS — J439 Emphysema, unspecified: Secondary | ICD-10-CM | POA: Diagnosis not present

## 2018-02-14 DIAGNOSIS — I10 Essential (primary) hypertension: Secondary | ICD-10-CM | POA: Diagnosis not present

## 2018-02-17 DIAGNOSIS — Z4800 Encounter for change or removal of nonsurgical wound dressing: Secondary | ICD-10-CM | POA: Diagnosis not present

## 2018-02-17 DIAGNOSIS — L97312 Non-pressure chronic ulcer of right ankle with fat layer exposed: Secondary | ICD-10-CM | POA: Diagnosis not present

## 2018-02-17 DIAGNOSIS — I10 Essential (primary) hypertension: Secondary | ICD-10-CM | POA: Diagnosis not present

## 2018-02-17 DIAGNOSIS — E11622 Type 2 diabetes mellitus with other skin ulcer: Secondary | ICD-10-CM | POA: Diagnosis not present

## 2018-02-17 DIAGNOSIS — J439 Emphysema, unspecified: Secondary | ICD-10-CM | POA: Diagnosis not present

## 2018-02-17 DIAGNOSIS — E1151 Type 2 diabetes mellitus with diabetic peripheral angiopathy without gangrene: Secondary | ICD-10-CM | POA: Diagnosis not present

## 2018-02-21 DIAGNOSIS — Z4800 Encounter for change or removal of nonsurgical wound dressing: Secondary | ICD-10-CM | POA: Diagnosis not present

## 2018-02-21 DIAGNOSIS — I10 Essential (primary) hypertension: Secondary | ICD-10-CM | POA: Diagnosis not present

## 2018-02-21 DIAGNOSIS — L97312 Non-pressure chronic ulcer of right ankle with fat layer exposed: Secondary | ICD-10-CM | POA: Diagnosis not present

## 2018-02-21 DIAGNOSIS — J439 Emphysema, unspecified: Secondary | ICD-10-CM | POA: Diagnosis not present

## 2018-02-21 DIAGNOSIS — E1151 Type 2 diabetes mellitus with diabetic peripheral angiopathy without gangrene: Secondary | ICD-10-CM | POA: Diagnosis not present

## 2018-02-21 DIAGNOSIS — E11622 Type 2 diabetes mellitus with other skin ulcer: Secondary | ICD-10-CM | POA: Diagnosis not present

## 2018-02-22 DIAGNOSIS — I10 Essential (primary) hypertension: Secondary | ICD-10-CM | POA: Diagnosis not present

## 2018-02-22 DIAGNOSIS — Z4800 Encounter for change or removal of nonsurgical wound dressing: Secondary | ICD-10-CM | POA: Diagnosis not present

## 2018-02-22 DIAGNOSIS — L97312 Non-pressure chronic ulcer of right ankle with fat layer exposed: Secondary | ICD-10-CM | POA: Diagnosis not present

## 2018-02-22 DIAGNOSIS — E1151 Type 2 diabetes mellitus with diabetic peripheral angiopathy without gangrene: Secondary | ICD-10-CM | POA: Diagnosis not present

## 2018-02-22 DIAGNOSIS — J439 Emphysema, unspecified: Secondary | ICD-10-CM | POA: Diagnosis not present

## 2018-02-22 DIAGNOSIS — E11622 Type 2 diabetes mellitus with other skin ulcer: Secondary | ICD-10-CM | POA: Diagnosis not present

## 2018-02-24 DIAGNOSIS — E11622 Type 2 diabetes mellitus with other skin ulcer: Secondary | ICD-10-CM | POA: Diagnosis not present

## 2018-02-24 DIAGNOSIS — Z4800 Encounter for change or removal of nonsurgical wound dressing: Secondary | ICD-10-CM | POA: Diagnosis not present

## 2018-02-24 DIAGNOSIS — I10 Essential (primary) hypertension: Secondary | ICD-10-CM | POA: Diagnosis not present

## 2018-02-24 DIAGNOSIS — E1151 Type 2 diabetes mellitus with diabetic peripheral angiopathy without gangrene: Secondary | ICD-10-CM | POA: Diagnosis not present

## 2018-02-24 DIAGNOSIS — J439 Emphysema, unspecified: Secondary | ICD-10-CM | POA: Diagnosis not present

## 2018-02-24 DIAGNOSIS — L97312 Non-pressure chronic ulcer of right ankle with fat layer exposed: Secondary | ICD-10-CM | POA: Diagnosis not present

## 2018-03-01 DIAGNOSIS — E1151 Type 2 diabetes mellitus with diabetic peripheral angiopathy without gangrene: Secondary | ICD-10-CM | POA: Diagnosis not present

## 2018-03-01 DIAGNOSIS — Z4800 Encounter for change or removal of nonsurgical wound dressing: Secondary | ICD-10-CM | POA: Diagnosis not present

## 2018-03-01 DIAGNOSIS — J439 Emphysema, unspecified: Secondary | ICD-10-CM | POA: Diagnosis not present

## 2018-03-01 DIAGNOSIS — E11622 Type 2 diabetes mellitus with other skin ulcer: Secondary | ICD-10-CM | POA: Diagnosis not present

## 2018-03-01 DIAGNOSIS — L97312 Non-pressure chronic ulcer of right ankle with fat layer exposed: Secondary | ICD-10-CM | POA: Diagnosis not present

## 2018-03-01 DIAGNOSIS — I10 Essential (primary) hypertension: Secondary | ICD-10-CM | POA: Diagnosis not present

## 2018-03-02 ENCOUNTER — Encounter (HOSPITAL_BASED_OUTPATIENT_CLINIC_OR_DEPARTMENT_OTHER): Payer: Medicare Other | Attending: Physician Assistant

## 2018-03-02 DIAGNOSIS — I2510 Atherosclerotic heart disease of native coronary artery without angina pectoris: Secondary | ICD-10-CM | POA: Diagnosis not present

## 2018-03-02 DIAGNOSIS — J449 Chronic obstructive pulmonary disease, unspecified: Secondary | ICD-10-CM | POA: Insufficient documentation

## 2018-03-02 DIAGNOSIS — Z9221 Personal history of antineoplastic chemotherapy: Secondary | ICD-10-CM | POA: Insufficient documentation

## 2018-03-02 DIAGNOSIS — E11621 Type 2 diabetes mellitus with foot ulcer: Secondary | ICD-10-CM | POA: Diagnosis not present

## 2018-03-02 DIAGNOSIS — E1151 Type 2 diabetes mellitus with diabetic peripheral angiopathy without gangrene: Secondary | ICD-10-CM | POA: Diagnosis not present

## 2018-03-02 DIAGNOSIS — Z7984 Long term (current) use of oral hypoglycemic drugs: Secondary | ICD-10-CM | POA: Insufficient documentation

## 2018-03-02 DIAGNOSIS — I10 Essential (primary) hypertension: Secondary | ICD-10-CM | POA: Diagnosis not present

## 2018-03-02 DIAGNOSIS — L97512 Non-pressure chronic ulcer of other part of right foot with fat layer exposed: Secondary | ICD-10-CM | POA: Insufficient documentation

## 2018-03-04 DIAGNOSIS — Z4800 Encounter for change or removal of nonsurgical wound dressing: Secondary | ICD-10-CM | POA: Diagnosis not present

## 2018-03-04 DIAGNOSIS — E11622 Type 2 diabetes mellitus with other skin ulcer: Secondary | ICD-10-CM | POA: Diagnosis not present

## 2018-03-04 DIAGNOSIS — I10 Essential (primary) hypertension: Secondary | ICD-10-CM | POA: Diagnosis not present

## 2018-03-04 DIAGNOSIS — J439 Emphysema, unspecified: Secondary | ICD-10-CM | POA: Diagnosis not present

## 2018-03-04 DIAGNOSIS — E1151 Type 2 diabetes mellitus with diabetic peripheral angiopathy without gangrene: Secondary | ICD-10-CM | POA: Diagnosis not present

## 2018-03-04 DIAGNOSIS — L97312 Non-pressure chronic ulcer of right ankle with fat layer exposed: Secondary | ICD-10-CM | POA: Diagnosis not present

## 2018-03-07 DIAGNOSIS — Z4800 Encounter for change or removal of nonsurgical wound dressing: Secondary | ICD-10-CM | POA: Diagnosis not present

## 2018-03-07 DIAGNOSIS — L97312 Non-pressure chronic ulcer of right ankle with fat layer exposed: Secondary | ICD-10-CM | POA: Diagnosis not present

## 2018-03-07 DIAGNOSIS — I10 Essential (primary) hypertension: Secondary | ICD-10-CM | POA: Diagnosis not present

## 2018-03-07 DIAGNOSIS — E1151 Type 2 diabetes mellitus with diabetic peripheral angiopathy without gangrene: Secondary | ICD-10-CM | POA: Diagnosis not present

## 2018-03-07 DIAGNOSIS — E11622 Type 2 diabetes mellitus with other skin ulcer: Secondary | ICD-10-CM | POA: Diagnosis not present

## 2018-03-07 DIAGNOSIS — J439 Emphysema, unspecified: Secondary | ICD-10-CM | POA: Diagnosis not present

## 2018-03-10 DIAGNOSIS — E1151 Type 2 diabetes mellitus with diabetic peripheral angiopathy without gangrene: Secondary | ICD-10-CM | POA: Diagnosis not present

## 2018-03-10 DIAGNOSIS — J439 Emphysema, unspecified: Secondary | ICD-10-CM | POA: Diagnosis not present

## 2018-03-10 DIAGNOSIS — Z4800 Encounter for change or removal of nonsurgical wound dressing: Secondary | ICD-10-CM | POA: Diagnosis not present

## 2018-03-10 DIAGNOSIS — L97312 Non-pressure chronic ulcer of right ankle with fat layer exposed: Secondary | ICD-10-CM | POA: Diagnosis not present

## 2018-03-10 DIAGNOSIS — E11622 Type 2 diabetes mellitus with other skin ulcer: Secondary | ICD-10-CM | POA: Diagnosis not present

## 2018-03-10 DIAGNOSIS — I10 Essential (primary) hypertension: Secondary | ICD-10-CM | POA: Diagnosis not present

## 2018-03-15 DIAGNOSIS — E1151 Type 2 diabetes mellitus with diabetic peripheral angiopathy without gangrene: Secondary | ICD-10-CM | POA: Diagnosis not present

## 2018-03-15 DIAGNOSIS — Z4800 Encounter for change or removal of nonsurgical wound dressing: Secondary | ICD-10-CM | POA: Diagnosis not present

## 2018-03-15 DIAGNOSIS — L97312 Non-pressure chronic ulcer of right ankle with fat layer exposed: Secondary | ICD-10-CM | POA: Diagnosis not present

## 2018-03-15 DIAGNOSIS — E11622 Type 2 diabetes mellitus with other skin ulcer: Secondary | ICD-10-CM | POA: Diagnosis not present

## 2018-03-15 DIAGNOSIS — J439 Emphysema, unspecified: Secondary | ICD-10-CM | POA: Diagnosis not present

## 2018-03-15 DIAGNOSIS — I10 Essential (primary) hypertension: Secondary | ICD-10-CM | POA: Diagnosis not present

## 2018-03-17 DIAGNOSIS — Z7984 Long term (current) use of oral hypoglycemic drugs: Secondary | ICD-10-CM | POA: Diagnosis not present

## 2018-03-17 DIAGNOSIS — Z951 Presence of aortocoronary bypass graft: Secondary | ICD-10-CM | POA: Diagnosis not present

## 2018-03-17 DIAGNOSIS — E11622 Type 2 diabetes mellitus with other skin ulcer: Secondary | ICD-10-CM | POA: Diagnosis not present

## 2018-03-17 DIAGNOSIS — E1151 Type 2 diabetes mellitus with diabetic peripheral angiopathy without gangrene: Secondary | ICD-10-CM | POA: Diagnosis not present

## 2018-03-17 DIAGNOSIS — J439 Emphysema, unspecified: Secondary | ICD-10-CM | POA: Diagnosis not present

## 2018-03-17 DIAGNOSIS — L97312 Non-pressure chronic ulcer of right ankle with fat layer exposed: Secondary | ICD-10-CM | POA: Diagnosis not present

## 2018-03-17 DIAGNOSIS — Z4800 Encounter for change or removal of nonsurgical wound dressing: Secondary | ICD-10-CM | POA: Diagnosis not present

## 2018-03-17 DIAGNOSIS — Z7902 Long term (current) use of antithrombotics/antiplatelets: Secondary | ICD-10-CM | POA: Diagnosis not present

## 2018-03-17 DIAGNOSIS — Z720 Tobacco use: Secondary | ICD-10-CM | POA: Diagnosis not present

## 2018-03-17 DIAGNOSIS — I10 Essential (primary) hypertension: Secondary | ICD-10-CM | POA: Diagnosis not present

## 2018-03-17 DIAGNOSIS — Z8701 Personal history of pneumonia (recurrent): Secondary | ICD-10-CM | POA: Diagnosis not present

## 2018-03-21 DIAGNOSIS — E1151 Type 2 diabetes mellitus with diabetic peripheral angiopathy without gangrene: Secondary | ICD-10-CM | POA: Diagnosis not present

## 2018-03-21 DIAGNOSIS — L97312 Non-pressure chronic ulcer of right ankle with fat layer exposed: Secondary | ICD-10-CM | POA: Diagnosis not present

## 2018-03-21 DIAGNOSIS — J439 Emphysema, unspecified: Secondary | ICD-10-CM | POA: Diagnosis not present

## 2018-03-21 DIAGNOSIS — I10 Essential (primary) hypertension: Secondary | ICD-10-CM | POA: Diagnosis not present

## 2018-03-21 DIAGNOSIS — Z4800 Encounter for change or removal of nonsurgical wound dressing: Secondary | ICD-10-CM | POA: Diagnosis not present

## 2018-03-21 DIAGNOSIS — E11622 Type 2 diabetes mellitus with other skin ulcer: Secondary | ICD-10-CM | POA: Diagnosis not present

## 2018-03-22 DIAGNOSIS — Z9221 Personal history of antineoplastic chemotherapy: Secondary | ICD-10-CM | POA: Diagnosis not present

## 2018-03-22 DIAGNOSIS — I998 Other disorder of circulatory system: Secondary | ICD-10-CM | POA: Diagnosis not present

## 2018-03-22 DIAGNOSIS — L97512 Non-pressure chronic ulcer of other part of right foot with fat layer exposed: Secondary | ICD-10-CM | POA: Diagnosis not present

## 2018-03-22 DIAGNOSIS — J449 Chronic obstructive pulmonary disease, unspecified: Secondary | ICD-10-CM | POA: Diagnosis not present

## 2018-03-22 DIAGNOSIS — E11621 Type 2 diabetes mellitus with foot ulcer: Secondary | ICD-10-CM | POA: Diagnosis not present

## 2018-03-22 DIAGNOSIS — E1151 Type 2 diabetes mellitus with diabetic peripheral angiopathy without gangrene: Secondary | ICD-10-CM | POA: Diagnosis not present

## 2018-03-22 DIAGNOSIS — Z7984 Long term (current) use of oral hypoglycemic drugs: Secondary | ICD-10-CM | POA: Diagnosis not present

## 2018-03-25 DIAGNOSIS — E11622 Type 2 diabetes mellitus with other skin ulcer: Secondary | ICD-10-CM | POA: Diagnosis not present

## 2018-03-25 DIAGNOSIS — L97312 Non-pressure chronic ulcer of right ankle with fat layer exposed: Secondary | ICD-10-CM | POA: Diagnosis not present

## 2018-03-25 DIAGNOSIS — E1151 Type 2 diabetes mellitus with diabetic peripheral angiopathy without gangrene: Secondary | ICD-10-CM | POA: Diagnosis not present

## 2018-03-25 DIAGNOSIS — Z4800 Encounter for change or removal of nonsurgical wound dressing: Secondary | ICD-10-CM | POA: Diagnosis not present

## 2018-03-25 DIAGNOSIS — J439 Emphysema, unspecified: Secondary | ICD-10-CM | POA: Diagnosis not present

## 2018-03-25 DIAGNOSIS — I10 Essential (primary) hypertension: Secondary | ICD-10-CM | POA: Diagnosis not present

## 2018-03-28 DIAGNOSIS — E039 Hypothyroidism, unspecified: Secondary | ICD-10-CM | POA: Diagnosis not present

## 2018-03-29 DIAGNOSIS — Z4800 Encounter for change or removal of nonsurgical wound dressing: Secondary | ICD-10-CM | POA: Diagnosis not present

## 2018-03-29 DIAGNOSIS — J439 Emphysema, unspecified: Secondary | ICD-10-CM | POA: Diagnosis not present

## 2018-03-29 DIAGNOSIS — E1151 Type 2 diabetes mellitus with diabetic peripheral angiopathy without gangrene: Secondary | ICD-10-CM | POA: Diagnosis not present

## 2018-03-29 DIAGNOSIS — E11622 Type 2 diabetes mellitus with other skin ulcer: Secondary | ICD-10-CM | POA: Diagnosis not present

## 2018-03-29 DIAGNOSIS — L97312 Non-pressure chronic ulcer of right ankle with fat layer exposed: Secondary | ICD-10-CM | POA: Diagnosis not present

## 2018-03-29 DIAGNOSIS — I10 Essential (primary) hypertension: Secondary | ICD-10-CM | POA: Diagnosis not present

## 2018-03-31 DIAGNOSIS — J439 Emphysema, unspecified: Secondary | ICD-10-CM | POA: Diagnosis not present

## 2018-03-31 DIAGNOSIS — I10 Essential (primary) hypertension: Secondary | ICD-10-CM | POA: Diagnosis not present

## 2018-03-31 DIAGNOSIS — E1151 Type 2 diabetes mellitus with diabetic peripheral angiopathy without gangrene: Secondary | ICD-10-CM | POA: Diagnosis not present

## 2018-03-31 DIAGNOSIS — Z4800 Encounter for change or removal of nonsurgical wound dressing: Secondary | ICD-10-CM | POA: Diagnosis not present

## 2018-03-31 DIAGNOSIS — L97312 Non-pressure chronic ulcer of right ankle with fat layer exposed: Secondary | ICD-10-CM | POA: Diagnosis not present

## 2018-03-31 DIAGNOSIS — E11622 Type 2 diabetes mellitus with other skin ulcer: Secondary | ICD-10-CM | POA: Diagnosis not present

## 2018-04-04 DIAGNOSIS — L97312 Non-pressure chronic ulcer of right ankle with fat layer exposed: Secondary | ICD-10-CM | POA: Diagnosis not present

## 2018-04-04 DIAGNOSIS — E1151 Type 2 diabetes mellitus with diabetic peripheral angiopathy without gangrene: Secondary | ICD-10-CM | POA: Diagnosis not present

## 2018-04-04 DIAGNOSIS — Z4800 Encounter for change or removal of nonsurgical wound dressing: Secondary | ICD-10-CM | POA: Diagnosis not present

## 2018-04-04 DIAGNOSIS — I10 Essential (primary) hypertension: Secondary | ICD-10-CM | POA: Diagnosis not present

## 2018-04-04 DIAGNOSIS — J439 Emphysema, unspecified: Secondary | ICD-10-CM | POA: Diagnosis not present

## 2018-04-04 DIAGNOSIS — E11622 Type 2 diabetes mellitus with other skin ulcer: Secondary | ICD-10-CM | POA: Diagnosis not present

## 2018-04-07 ENCOUNTER — Encounter (HOSPITAL_BASED_OUTPATIENT_CLINIC_OR_DEPARTMENT_OTHER): Payer: Self-pay

## 2018-04-07 ENCOUNTER — Encounter (HOSPITAL_BASED_OUTPATIENT_CLINIC_OR_DEPARTMENT_OTHER): Payer: Medicare Other | Attending: Internal Medicine

## 2018-04-07 DIAGNOSIS — E1151 Type 2 diabetes mellitus with diabetic peripheral angiopathy without gangrene: Secondary | ICD-10-CM | POA: Diagnosis not present

## 2018-04-07 DIAGNOSIS — Z7984 Long term (current) use of oral hypoglycemic drugs: Secondary | ICD-10-CM | POA: Diagnosis not present

## 2018-04-07 DIAGNOSIS — J449 Chronic obstructive pulmonary disease, unspecified: Secondary | ICD-10-CM | POA: Diagnosis not present

## 2018-04-07 DIAGNOSIS — I2510 Atherosclerotic heart disease of native coronary artery without angina pectoris: Secondary | ICD-10-CM | POA: Diagnosis not present

## 2018-04-07 DIAGNOSIS — E11621 Type 2 diabetes mellitus with foot ulcer: Secondary | ICD-10-CM | POA: Insufficient documentation

## 2018-04-07 DIAGNOSIS — Z951 Presence of aortocoronary bypass graft: Secondary | ICD-10-CM | POA: Diagnosis not present

## 2018-04-07 DIAGNOSIS — Z9221 Personal history of antineoplastic chemotherapy: Secondary | ICD-10-CM | POA: Diagnosis not present

## 2018-04-07 DIAGNOSIS — L97512 Non-pressure chronic ulcer of other part of right foot with fat layer exposed: Secondary | ICD-10-CM | POA: Diagnosis not present

## 2018-04-07 DIAGNOSIS — I10 Essential (primary) hypertension: Secondary | ICD-10-CM | POA: Insufficient documentation

## 2018-04-12 DIAGNOSIS — L97312 Non-pressure chronic ulcer of right ankle with fat layer exposed: Secondary | ICD-10-CM | POA: Diagnosis not present

## 2018-04-12 DIAGNOSIS — Z4800 Encounter for change or removal of nonsurgical wound dressing: Secondary | ICD-10-CM | POA: Diagnosis not present

## 2018-04-12 DIAGNOSIS — E11622 Type 2 diabetes mellitus with other skin ulcer: Secondary | ICD-10-CM | POA: Diagnosis not present

## 2018-04-12 DIAGNOSIS — E1151 Type 2 diabetes mellitus with diabetic peripheral angiopathy without gangrene: Secondary | ICD-10-CM | POA: Diagnosis not present

## 2018-04-12 DIAGNOSIS — I10 Essential (primary) hypertension: Secondary | ICD-10-CM | POA: Diagnosis not present

## 2018-04-12 DIAGNOSIS — J439 Emphysema, unspecified: Secondary | ICD-10-CM | POA: Diagnosis not present

## 2018-04-14 DIAGNOSIS — E1151 Type 2 diabetes mellitus with diabetic peripheral angiopathy without gangrene: Secondary | ICD-10-CM | POA: Diagnosis not present

## 2018-04-14 DIAGNOSIS — I10 Essential (primary) hypertension: Secondary | ICD-10-CM | POA: Diagnosis not present

## 2018-04-14 DIAGNOSIS — Z4800 Encounter for change or removal of nonsurgical wound dressing: Secondary | ICD-10-CM | POA: Diagnosis not present

## 2018-04-14 DIAGNOSIS — E11622 Type 2 diabetes mellitus with other skin ulcer: Secondary | ICD-10-CM | POA: Diagnosis not present

## 2018-04-14 DIAGNOSIS — L97312 Non-pressure chronic ulcer of right ankle with fat layer exposed: Secondary | ICD-10-CM | POA: Diagnosis not present

## 2018-04-14 DIAGNOSIS — J439 Emphysema, unspecified: Secondary | ICD-10-CM | POA: Diagnosis not present

## 2018-04-18 DIAGNOSIS — Z4800 Encounter for change or removal of nonsurgical wound dressing: Secondary | ICD-10-CM | POA: Diagnosis not present

## 2018-04-18 DIAGNOSIS — E1151 Type 2 diabetes mellitus with diabetic peripheral angiopathy without gangrene: Secondary | ICD-10-CM | POA: Diagnosis not present

## 2018-04-18 DIAGNOSIS — I10 Essential (primary) hypertension: Secondary | ICD-10-CM | POA: Diagnosis not present

## 2018-04-18 DIAGNOSIS — J439 Emphysema, unspecified: Secondary | ICD-10-CM | POA: Diagnosis not present

## 2018-04-18 DIAGNOSIS — E11622 Type 2 diabetes mellitus with other skin ulcer: Secondary | ICD-10-CM | POA: Diagnosis not present

## 2018-04-18 DIAGNOSIS — L97312 Non-pressure chronic ulcer of right ankle with fat layer exposed: Secondary | ICD-10-CM | POA: Diagnosis not present

## 2018-04-21 DIAGNOSIS — E11621 Type 2 diabetes mellitus with foot ulcer: Secondary | ICD-10-CM | POA: Diagnosis not present

## 2018-04-21 DIAGNOSIS — L97512 Non-pressure chronic ulcer of other part of right foot with fat layer exposed: Secondary | ICD-10-CM | POA: Diagnosis not present

## 2018-04-21 DIAGNOSIS — I10 Essential (primary) hypertension: Secondary | ICD-10-CM | POA: Diagnosis not present

## 2018-04-21 DIAGNOSIS — I2510 Atherosclerotic heart disease of native coronary artery without angina pectoris: Secondary | ICD-10-CM | POA: Diagnosis not present

## 2018-04-21 DIAGNOSIS — J449 Chronic obstructive pulmonary disease, unspecified: Secondary | ICD-10-CM | POA: Diagnosis not present

## 2018-04-21 DIAGNOSIS — E1151 Type 2 diabetes mellitus with diabetic peripheral angiopathy without gangrene: Secondary | ICD-10-CM | POA: Diagnosis not present

## 2018-04-21 DIAGNOSIS — I739 Peripheral vascular disease, unspecified: Secondary | ICD-10-CM | POA: Diagnosis not present

## 2018-04-26 DIAGNOSIS — J439 Emphysema, unspecified: Secondary | ICD-10-CM | POA: Diagnosis not present

## 2018-04-26 DIAGNOSIS — Z4800 Encounter for change or removal of nonsurgical wound dressing: Secondary | ICD-10-CM | POA: Diagnosis not present

## 2018-04-26 DIAGNOSIS — L97312 Non-pressure chronic ulcer of right ankle with fat layer exposed: Secondary | ICD-10-CM | POA: Diagnosis not present

## 2018-04-26 DIAGNOSIS — E1151 Type 2 diabetes mellitus with diabetic peripheral angiopathy without gangrene: Secondary | ICD-10-CM | POA: Diagnosis not present

## 2018-04-26 DIAGNOSIS — E11622 Type 2 diabetes mellitus with other skin ulcer: Secondary | ICD-10-CM | POA: Diagnosis not present

## 2018-04-26 DIAGNOSIS — I10 Essential (primary) hypertension: Secondary | ICD-10-CM | POA: Diagnosis not present

## 2018-04-28 DIAGNOSIS — I10 Essential (primary) hypertension: Secondary | ICD-10-CM | POA: Diagnosis not present

## 2018-04-28 DIAGNOSIS — J439 Emphysema, unspecified: Secondary | ICD-10-CM | POA: Diagnosis not present

## 2018-04-28 DIAGNOSIS — Z4800 Encounter for change or removal of nonsurgical wound dressing: Secondary | ICD-10-CM | POA: Diagnosis not present

## 2018-04-28 DIAGNOSIS — L97312 Non-pressure chronic ulcer of right ankle with fat layer exposed: Secondary | ICD-10-CM | POA: Diagnosis not present

## 2018-04-28 DIAGNOSIS — E11622 Type 2 diabetes mellitus with other skin ulcer: Secondary | ICD-10-CM | POA: Diagnosis not present

## 2018-04-28 DIAGNOSIS — E1151 Type 2 diabetes mellitus with diabetic peripheral angiopathy without gangrene: Secondary | ICD-10-CM | POA: Diagnosis not present

## 2018-05-02 DIAGNOSIS — L97312 Non-pressure chronic ulcer of right ankle with fat layer exposed: Secondary | ICD-10-CM | POA: Diagnosis not present

## 2018-05-02 DIAGNOSIS — Z4800 Encounter for change or removal of nonsurgical wound dressing: Secondary | ICD-10-CM | POA: Diagnosis not present

## 2018-05-02 DIAGNOSIS — J439 Emphysema, unspecified: Secondary | ICD-10-CM | POA: Diagnosis not present

## 2018-05-02 DIAGNOSIS — I10 Essential (primary) hypertension: Secondary | ICD-10-CM | POA: Diagnosis not present

## 2018-05-02 DIAGNOSIS — E1151 Type 2 diabetes mellitus with diabetic peripheral angiopathy without gangrene: Secondary | ICD-10-CM | POA: Diagnosis not present

## 2018-05-02 DIAGNOSIS — E11622 Type 2 diabetes mellitus with other skin ulcer: Secondary | ICD-10-CM | POA: Diagnosis not present

## 2018-05-05 ENCOUNTER — Encounter (HOSPITAL_BASED_OUTPATIENT_CLINIC_OR_DEPARTMENT_OTHER): Payer: Medicare Other | Attending: Internal Medicine

## 2018-05-05 DIAGNOSIS — I2510 Atherosclerotic heart disease of native coronary artery without angina pectoris: Secondary | ICD-10-CM | POA: Insufficient documentation

## 2018-05-05 DIAGNOSIS — E11622 Type 2 diabetes mellitus with other skin ulcer: Secondary | ICD-10-CM | POA: Insufficient documentation

## 2018-05-05 DIAGNOSIS — L97512 Non-pressure chronic ulcer of other part of right foot with fat layer exposed: Secondary | ICD-10-CM | POA: Diagnosis not present

## 2018-05-05 DIAGNOSIS — E11621 Type 2 diabetes mellitus with foot ulcer: Secondary | ICD-10-CM | POA: Insufficient documentation

## 2018-05-05 DIAGNOSIS — I10 Essential (primary) hypertension: Secondary | ICD-10-CM | POA: Insufficient documentation

## 2018-05-05 DIAGNOSIS — Z9221 Personal history of antineoplastic chemotherapy: Secondary | ICD-10-CM | POA: Diagnosis not present

## 2018-05-05 DIAGNOSIS — J449 Chronic obstructive pulmonary disease, unspecified: Secondary | ICD-10-CM | POA: Diagnosis not present

## 2018-05-05 DIAGNOSIS — L97812 Non-pressure chronic ulcer of other part of right lower leg with fat layer exposed: Secondary | ICD-10-CM | POA: Insufficient documentation

## 2018-05-05 DIAGNOSIS — E1151 Type 2 diabetes mellitus with diabetic peripheral angiopathy without gangrene: Secondary | ICD-10-CM | POA: Diagnosis not present

## 2018-05-06 DIAGNOSIS — E039 Hypothyroidism, unspecified: Secondary | ICD-10-CM | POA: Diagnosis not present

## 2018-05-09 DIAGNOSIS — E1151 Type 2 diabetes mellitus with diabetic peripheral angiopathy without gangrene: Secondary | ICD-10-CM | POA: Diagnosis not present

## 2018-05-09 DIAGNOSIS — J439 Emphysema, unspecified: Secondary | ICD-10-CM | POA: Diagnosis not present

## 2018-05-09 DIAGNOSIS — Z4800 Encounter for change or removal of nonsurgical wound dressing: Secondary | ICD-10-CM | POA: Diagnosis not present

## 2018-05-09 DIAGNOSIS — E11622 Type 2 diabetes mellitus with other skin ulcer: Secondary | ICD-10-CM | POA: Diagnosis not present

## 2018-05-09 DIAGNOSIS — L97312 Non-pressure chronic ulcer of right ankle with fat layer exposed: Secondary | ICD-10-CM | POA: Diagnosis not present

## 2018-05-09 DIAGNOSIS — I10 Essential (primary) hypertension: Secondary | ICD-10-CM | POA: Diagnosis not present

## 2018-05-12 DIAGNOSIS — Z4800 Encounter for change or removal of nonsurgical wound dressing: Secondary | ICD-10-CM | POA: Diagnosis not present

## 2018-05-12 DIAGNOSIS — L97312 Non-pressure chronic ulcer of right ankle with fat layer exposed: Secondary | ICD-10-CM | POA: Diagnosis not present

## 2018-05-12 DIAGNOSIS — E1151 Type 2 diabetes mellitus with diabetic peripheral angiopathy without gangrene: Secondary | ICD-10-CM | POA: Diagnosis not present

## 2018-05-12 DIAGNOSIS — J439 Emphysema, unspecified: Secondary | ICD-10-CM | POA: Diagnosis not present

## 2018-05-12 DIAGNOSIS — E11622 Type 2 diabetes mellitus with other skin ulcer: Secondary | ICD-10-CM | POA: Diagnosis not present

## 2018-05-12 DIAGNOSIS — I10 Essential (primary) hypertension: Secondary | ICD-10-CM | POA: Diagnosis not present

## 2018-05-13 DIAGNOSIS — L97512 Non-pressure chronic ulcer of other part of right foot with fat layer exposed: Secondary | ICD-10-CM | POA: Diagnosis not present

## 2018-05-13 DIAGNOSIS — I998 Other disorder of circulatory system: Secondary | ICD-10-CM | POA: Diagnosis not present

## 2018-05-13 DIAGNOSIS — E1151 Type 2 diabetes mellitus with diabetic peripheral angiopathy without gangrene: Secondary | ICD-10-CM | POA: Diagnosis not present

## 2018-05-13 DIAGNOSIS — L97812 Non-pressure chronic ulcer of other part of right lower leg with fat layer exposed: Secondary | ICD-10-CM | POA: Diagnosis not present

## 2018-05-13 DIAGNOSIS — E11622 Type 2 diabetes mellitus with other skin ulcer: Secondary | ICD-10-CM | POA: Diagnosis not present

## 2018-05-13 DIAGNOSIS — E11621 Type 2 diabetes mellitus with foot ulcer: Secondary | ICD-10-CM | POA: Diagnosis not present

## 2018-05-13 DIAGNOSIS — J449 Chronic obstructive pulmonary disease, unspecified: Secondary | ICD-10-CM | POA: Diagnosis not present

## 2018-05-16 DIAGNOSIS — L97211 Non-pressure chronic ulcer of right calf limited to breakdown of skin: Secondary | ICD-10-CM | POA: Diagnosis not present

## 2018-05-16 DIAGNOSIS — Z8701 Personal history of pneumonia (recurrent): Secondary | ICD-10-CM | POA: Diagnosis not present

## 2018-05-16 DIAGNOSIS — Z720 Tobacco use: Secondary | ICD-10-CM | POA: Diagnosis not present

## 2018-05-16 DIAGNOSIS — E11622 Type 2 diabetes mellitus with other skin ulcer: Secondary | ICD-10-CM | POA: Diagnosis not present

## 2018-05-16 DIAGNOSIS — Z4800 Encounter for change or removal of nonsurgical wound dressing: Secondary | ICD-10-CM | POA: Diagnosis not present

## 2018-05-16 DIAGNOSIS — Z7984 Long term (current) use of oral hypoglycemic drugs: Secondary | ICD-10-CM | POA: Diagnosis not present

## 2018-05-16 DIAGNOSIS — E1151 Type 2 diabetes mellitus with diabetic peripheral angiopathy without gangrene: Secondary | ICD-10-CM | POA: Diagnosis not present

## 2018-05-16 DIAGNOSIS — J439 Emphysema, unspecified: Secondary | ICD-10-CM | POA: Diagnosis not present

## 2018-05-16 DIAGNOSIS — I10 Essential (primary) hypertension: Secondary | ICD-10-CM | POA: Diagnosis not present

## 2018-05-16 DIAGNOSIS — L97312 Non-pressure chronic ulcer of right ankle with fat layer exposed: Secondary | ICD-10-CM | POA: Diagnosis not present

## 2018-05-16 DIAGNOSIS — Z951 Presence of aortocoronary bypass graft: Secondary | ICD-10-CM | POA: Diagnosis not present

## 2018-05-16 DIAGNOSIS — Z7902 Long term (current) use of antithrombotics/antiplatelets: Secondary | ICD-10-CM | POA: Diagnosis not present

## 2018-05-17 DIAGNOSIS — I10 Essential (primary) hypertension: Secondary | ICD-10-CM | POA: Diagnosis not present

## 2018-05-17 DIAGNOSIS — L97211 Non-pressure chronic ulcer of right calf limited to breakdown of skin: Secondary | ICD-10-CM | POA: Diagnosis not present

## 2018-05-17 DIAGNOSIS — J439 Emphysema, unspecified: Secondary | ICD-10-CM | POA: Diagnosis not present

## 2018-05-17 DIAGNOSIS — L97312 Non-pressure chronic ulcer of right ankle with fat layer exposed: Secondary | ICD-10-CM | POA: Diagnosis not present

## 2018-05-17 DIAGNOSIS — E1151 Type 2 diabetes mellitus with diabetic peripheral angiopathy without gangrene: Secondary | ICD-10-CM | POA: Diagnosis not present

## 2018-05-17 DIAGNOSIS — E11622 Type 2 diabetes mellitus with other skin ulcer: Secondary | ICD-10-CM | POA: Diagnosis not present

## 2018-05-18 DIAGNOSIS — I10 Essential (primary) hypertension: Secondary | ICD-10-CM | POA: Diagnosis not present

## 2018-05-18 DIAGNOSIS — E11622 Type 2 diabetes mellitus with other skin ulcer: Secondary | ICD-10-CM | POA: Diagnosis not present

## 2018-05-18 DIAGNOSIS — L97312 Non-pressure chronic ulcer of right ankle with fat layer exposed: Secondary | ICD-10-CM | POA: Diagnosis not present

## 2018-05-18 DIAGNOSIS — L97211 Non-pressure chronic ulcer of right calf limited to breakdown of skin: Secondary | ICD-10-CM | POA: Diagnosis not present

## 2018-05-18 DIAGNOSIS — E1151 Type 2 diabetes mellitus with diabetic peripheral angiopathy without gangrene: Secondary | ICD-10-CM | POA: Diagnosis not present

## 2018-05-18 DIAGNOSIS — J439 Emphysema, unspecified: Secondary | ICD-10-CM | POA: Diagnosis not present

## 2018-05-20 DIAGNOSIS — L97211 Non-pressure chronic ulcer of right calf limited to breakdown of skin: Secondary | ICD-10-CM | POA: Diagnosis not present

## 2018-05-20 DIAGNOSIS — E1151 Type 2 diabetes mellitus with diabetic peripheral angiopathy without gangrene: Secondary | ICD-10-CM | POA: Diagnosis not present

## 2018-05-20 DIAGNOSIS — I10 Essential (primary) hypertension: Secondary | ICD-10-CM | POA: Diagnosis not present

## 2018-05-20 DIAGNOSIS — J439 Emphysema, unspecified: Secondary | ICD-10-CM | POA: Diagnosis not present

## 2018-05-20 DIAGNOSIS — L97312 Non-pressure chronic ulcer of right ankle with fat layer exposed: Secondary | ICD-10-CM | POA: Diagnosis not present

## 2018-05-20 DIAGNOSIS — E11622 Type 2 diabetes mellitus with other skin ulcer: Secondary | ICD-10-CM | POA: Diagnosis not present

## 2018-05-23 DIAGNOSIS — L97312 Non-pressure chronic ulcer of right ankle with fat layer exposed: Secondary | ICD-10-CM | POA: Diagnosis not present

## 2018-05-23 DIAGNOSIS — J439 Emphysema, unspecified: Secondary | ICD-10-CM | POA: Diagnosis not present

## 2018-05-23 DIAGNOSIS — E1151 Type 2 diabetes mellitus with diabetic peripheral angiopathy without gangrene: Secondary | ICD-10-CM | POA: Diagnosis not present

## 2018-05-23 DIAGNOSIS — I10 Essential (primary) hypertension: Secondary | ICD-10-CM | POA: Diagnosis not present

## 2018-05-23 DIAGNOSIS — E11622 Type 2 diabetes mellitus with other skin ulcer: Secondary | ICD-10-CM | POA: Diagnosis not present

## 2018-05-23 DIAGNOSIS — L97211 Non-pressure chronic ulcer of right calf limited to breakdown of skin: Secondary | ICD-10-CM | POA: Diagnosis not present

## 2018-05-27 ENCOUNTER — Encounter (HOSPITAL_BASED_OUTPATIENT_CLINIC_OR_DEPARTMENT_OTHER): Payer: Medicare Other | Attending: Internal Medicine

## 2018-05-27 DIAGNOSIS — E1151 Type 2 diabetes mellitus with diabetic peripheral angiopathy without gangrene: Secondary | ICD-10-CM | POA: Diagnosis not present

## 2018-05-27 DIAGNOSIS — Z9221 Personal history of antineoplastic chemotherapy: Secondary | ICD-10-CM | POA: Insufficient documentation

## 2018-05-27 DIAGNOSIS — I2510 Atherosclerotic heart disease of native coronary artery without angina pectoris: Secondary | ICD-10-CM | POA: Diagnosis not present

## 2018-05-27 DIAGNOSIS — L97512 Non-pressure chronic ulcer of other part of right foot with fat layer exposed: Secondary | ICD-10-CM | POA: Insufficient documentation

## 2018-05-27 DIAGNOSIS — L97812 Non-pressure chronic ulcer of other part of right lower leg with fat layer exposed: Secondary | ICD-10-CM | POA: Insufficient documentation

## 2018-05-27 DIAGNOSIS — E11621 Type 2 diabetes mellitus with foot ulcer: Secondary | ICD-10-CM | POA: Insufficient documentation

## 2018-05-27 DIAGNOSIS — J449 Chronic obstructive pulmonary disease, unspecified: Secondary | ICD-10-CM | POA: Diagnosis not present

## 2018-05-27 DIAGNOSIS — E11622 Type 2 diabetes mellitus with other skin ulcer: Secondary | ICD-10-CM | POA: Insufficient documentation

## 2018-05-27 DIAGNOSIS — I998 Other disorder of circulatory system: Secondary | ICD-10-CM | POA: Diagnosis not present

## 2018-05-30 DIAGNOSIS — J439 Emphysema, unspecified: Secondary | ICD-10-CM | POA: Diagnosis not present

## 2018-05-30 DIAGNOSIS — I10 Essential (primary) hypertension: Secondary | ICD-10-CM | POA: Diagnosis not present

## 2018-05-30 DIAGNOSIS — E1151 Type 2 diabetes mellitus with diabetic peripheral angiopathy without gangrene: Secondary | ICD-10-CM | POA: Diagnosis not present

## 2018-05-30 DIAGNOSIS — L97211 Non-pressure chronic ulcer of right calf limited to breakdown of skin: Secondary | ICD-10-CM | POA: Diagnosis not present

## 2018-05-30 DIAGNOSIS — L97312 Non-pressure chronic ulcer of right ankle with fat layer exposed: Secondary | ICD-10-CM | POA: Diagnosis not present

## 2018-05-30 DIAGNOSIS — E11622 Type 2 diabetes mellitus with other skin ulcer: Secondary | ICD-10-CM | POA: Diagnosis not present

## 2018-06-02 DIAGNOSIS — I10 Essential (primary) hypertension: Secondary | ICD-10-CM | POA: Diagnosis not present

## 2018-06-02 DIAGNOSIS — L97211 Non-pressure chronic ulcer of right calf limited to breakdown of skin: Secondary | ICD-10-CM | POA: Diagnosis not present

## 2018-06-02 DIAGNOSIS — L97312 Non-pressure chronic ulcer of right ankle with fat layer exposed: Secondary | ICD-10-CM | POA: Diagnosis not present

## 2018-06-02 DIAGNOSIS — E11622 Type 2 diabetes mellitus with other skin ulcer: Secondary | ICD-10-CM | POA: Diagnosis not present

## 2018-06-02 DIAGNOSIS — E1151 Type 2 diabetes mellitus with diabetic peripheral angiopathy without gangrene: Secondary | ICD-10-CM | POA: Diagnosis not present

## 2018-06-02 DIAGNOSIS — J439 Emphysema, unspecified: Secondary | ICD-10-CM | POA: Diagnosis not present

## 2018-06-06 DIAGNOSIS — I10 Essential (primary) hypertension: Secondary | ICD-10-CM | POA: Diagnosis not present

## 2018-06-06 DIAGNOSIS — L97211 Non-pressure chronic ulcer of right calf limited to breakdown of skin: Secondary | ICD-10-CM | POA: Diagnosis not present

## 2018-06-06 DIAGNOSIS — E11622 Type 2 diabetes mellitus with other skin ulcer: Secondary | ICD-10-CM | POA: Diagnosis not present

## 2018-06-06 DIAGNOSIS — E1151 Type 2 diabetes mellitus with diabetic peripheral angiopathy without gangrene: Secondary | ICD-10-CM | POA: Diagnosis not present

## 2018-06-06 DIAGNOSIS — L97312 Non-pressure chronic ulcer of right ankle with fat layer exposed: Secondary | ICD-10-CM | POA: Diagnosis not present

## 2018-06-06 DIAGNOSIS — J439 Emphysema, unspecified: Secondary | ICD-10-CM | POA: Diagnosis not present

## 2018-06-10 DIAGNOSIS — I10 Essential (primary) hypertension: Secondary | ICD-10-CM | POA: Diagnosis not present

## 2018-06-10 DIAGNOSIS — J439 Emphysema, unspecified: Secondary | ICD-10-CM | POA: Diagnosis not present

## 2018-06-10 DIAGNOSIS — L97312 Non-pressure chronic ulcer of right ankle with fat layer exposed: Secondary | ICD-10-CM | POA: Diagnosis not present

## 2018-06-10 DIAGNOSIS — L97211 Non-pressure chronic ulcer of right calf limited to breakdown of skin: Secondary | ICD-10-CM | POA: Diagnosis not present

## 2018-06-10 DIAGNOSIS — E1151 Type 2 diabetes mellitus with diabetic peripheral angiopathy without gangrene: Secondary | ICD-10-CM | POA: Diagnosis not present

## 2018-06-10 DIAGNOSIS — E11622 Type 2 diabetes mellitus with other skin ulcer: Secondary | ICD-10-CM | POA: Diagnosis not present

## 2018-06-13 DIAGNOSIS — L97312 Non-pressure chronic ulcer of right ankle with fat layer exposed: Secondary | ICD-10-CM | POA: Diagnosis not present

## 2018-06-13 DIAGNOSIS — E11622 Type 2 diabetes mellitus with other skin ulcer: Secondary | ICD-10-CM | POA: Diagnosis not present

## 2018-06-13 DIAGNOSIS — I10 Essential (primary) hypertension: Secondary | ICD-10-CM | POA: Diagnosis not present

## 2018-06-13 DIAGNOSIS — L97211 Non-pressure chronic ulcer of right calf limited to breakdown of skin: Secondary | ICD-10-CM | POA: Diagnosis not present

## 2018-06-13 DIAGNOSIS — E1151 Type 2 diabetes mellitus with diabetic peripheral angiopathy without gangrene: Secondary | ICD-10-CM | POA: Diagnosis not present

## 2018-06-13 DIAGNOSIS — J439 Emphysema, unspecified: Secondary | ICD-10-CM | POA: Diagnosis not present

## 2018-06-15 ENCOUNTER — Ambulatory Visit: Payer: Medicare Other | Admitting: Cardiovascular Disease

## 2018-06-15 ENCOUNTER — Ambulatory Visit (INDEPENDENT_AMBULATORY_CARE_PROVIDER_SITE_OTHER): Payer: Medicare Other | Admitting: Cardiovascular Disease

## 2018-06-15 ENCOUNTER — Encounter: Payer: Self-pay | Admitting: Cardiovascular Disease

## 2018-06-15 DIAGNOSIS — I6523 Occlusion and stenosis of bilateral carotid arteries: Secondary | ICD-10-CM | POA: Diagnosis not present

## 2018-06-15 DIAGNOSIS — I10 Essential (primary) hypertension: Secondary | ICD-10-CM | POA: Diagnosis not present

## 2018-06-15 DIAGNOSIS — I739 Peripheral vascular disease, unspecified: Secondary | ICD-10-CM

## 2018-06-15 DIAGNOSIS — I779 Disorder of arteries and arterioles, unspecified: Secondary | ICD-10-CM

## 2018-06-15 DIAGNOSIS — Z951 Presence of aortocoronary bypass graft: Secondary | ICD-10-CM | POA: Diagnosis not present

## 2018-06-15 DIAGNOSIS — E7800 Pure hypercholesterolemia, unspecified: Secondary | ICD-10-CM | POA: Diagnosis not present

## 2018-06-15 NOTE — Patient Instructions (Signed)
Medication Instructions:  Your physician recommends that you continue on your current medications as directed. Please refer to the Current Medication list given to you today.  If you need a refill on your cardiac medications before your next appointment, please call your pharmacy.   Lab work: none If you have labs (blood work) drawn today and your tests are completely normal, you will receive your results only by: . MyChart Message (if you have MyChart) OR . A paper copy in the mail If you have any lab test that is abnormal or we need to change your treatment, we will call you to review the results.  Testing/Procedures: none  Follow-Up: At [facility], you and your health needs are our priority.  As part of our continuing mission to provide you with exceptional heart care, we have created designated Provider Care Teams.  These Care Teams include your primary Cardiologist (physician) and Advanced Practice Providers (APPs -  Physician Assistants and Nurse Practitioners) who all work together to provide you with the care you need, when you need it. You will need a follow up appointment in 12 months.  Please call our office 2 months in advance to schedule this appointment.  You may see Jonathan Berry, MD or one of the following Advanced Practice Providers on your designated Care Team:   Luke Kilroy, PA-C Krista Kroeger, PA-C . Callie Goodrich, PA-C  Any Other Special Instructions Will Be Listed Below (If Applicable).    

## 2018-06-15 NOTE — Assessment & Plan Note (Signed)
History of peripheral arterial disease status post stenting of the left common iliac artery December 2003 and right SFA April 2008 by myself.  He had developed critical limb ischemia and was sent to Dr. Myra Gianotti who opened up an occluded right SFA and placed a Viabahn covered stent.  Dr. Myra Gianotti currently is following his lower extremity arterial Doppler studies.

## 2018-06-15 NOTE — Assessment & Plan Note (Signed)
History of essential hypertension her blood pressure measured today 150/80.  He is on Toprol.  Continue current meds at current dosing.

## 2018-06-15 NOTE — Assessment & Plan Note (Signed)
History of carotid artery disease with a known occluded left internal carotid artery artery and moderate right ICA stenosis followed by Dr. Myra Gianotti.

## 2018-06-15 NOTE — Assessment & Plan Note (Signed)
History of hyperlipidemia not on statin therapy with lipid profile performed 02/04/2018 revealing total cholesterol 120, LDL 60 and HDL of 36.

## 2018-06-15 NOTE — Assessment & Plan Note (Signed)
History of CAD status post bypass grafting by Dr. [name] February 2010 with a LIMA to the LAD, left radial to the first obtuse marginal branch, vein to ramus intermedius and sequential vein to the acute marginal of the distal and distal right coronary artery.  He had a Myoview performed May 2010 which was nonischemic and another Myoview performed in May of this year that showed subtle ischemia but was read as low risk.  He is currently asymptomatic.

## 2018-06-15 NOTE — Progress Notes (Signed)
06/15/2018 Reginald Ayers   1951/05/19  [id number]  Primary Physician Renford Dills, MD Primary Cardiologist: Runell Gess MD FACP, [place], [place], [place]  HPI:  Reginald Ayers is a 67 y.o.  thin-appearing, single Caucasian male with no children whom I last saw 11 months ago. He has a history of CAD and PAD.I last saw him in the office  12/28/2017. I stented his proximal left common iliac artery in December 2003 and right SFA in April of 2008. He does have significant infrapopliteal disease bilaterally and known occluded left SFA. He also has an occluded right SFA by Dopplers in our office last September was earlier this month.. His ABIs were 0.48 on the right and 0.52 on the left, but he really denies claudication. He has known carotid disease with occluded left internal carotid artery and moderate right ICA stenosis, which has remained ultrasonographically stable. He is neurologically asymptomatic. His other problems include diabetes, hypertension, hyperlipidemia, and long-term tobacco abuse, smoking one-half pack per day. He had coronary artery bypass grafting by Dr. Andrey Spearman in February of 2010 with LIMA to his LAD, left radial to the first obtuse marginal branch, vein to ramus intermedius, and a sequential vein to an acute marginal and distal right coronary artery. Myoview performed in May of 2010 was nonischemic and showed apical scar consistent with his wall motion abnormality and his known occluded LAD.Dr. Earl Gala follows his lipid profile.  He had recurrent critical limb ischemia and had intervention by Dr. Myra Gianotti in December of last year opening up an occluded right SFA stent that I have placed 11 years ago with a Viabahn. covered stent.  He has recently complained of recurrent nitro responsive chest pain.  Did have a Myoview stress test performed in 2015 which was low risk.  Since I saw him 6 months ago he is remained stable.  He did have a Myoview performed 01/05/2018 which  was low risk.  He said no recurrent chest pain.  He apparently does have wounds on his right leg followed by Dr. Leanord Hawking and he has an upcoming appointment with Dr. Myra Gianotti..   Current Meds  Medication Sig  . albuterol (PROVENTIL) (2.5 MG/3ML) 0.083% nebulizer solution U 3 ML VIA NEB TID PRN  . B Complex Vitamins (B COMPLEX PO) Take 1 tablet by mouth 2 (two) times a week.   . Benzocaine (BOIL-EASE EX) Apply 1 application topically daily as needed (BOIL).  Marland Kitchen clopidogrel (PLAVIX) 75 MG tablet Take 75 mg by mouth daily.   . Cyanocobalamin (VITAMIN B-12 IJ) Inject 1 Dose as directed every 30 (thirty) days.   . furosemide (LASIX) 40 MG tablet Take 40 mg by mouth daily as needed for edema.  Marland Kitchen glipiZIDE (GLUCOTROL XL) 10 MG 24 hr tablet Take 10 mg by mouth 2 (two) times daily.   . Homeopathic Products (LEG CRAMP RELIEF SL) Place 2 tablets under the tongue at bedtime as needed (leg cramps).   Marland Kitchen ibuprofen (ADVIL,MOTRIN) 200 MG tablet Take 800 mg by mouth every 8 (eight) hours as needed for moderate pain.  . IRON PO Take 1 tablet by mouth 4 (four) times a week.   . metFORMIN (GLUCOPHAGE) 500 MG tablet Take 1,000 mg by mouth 2 (two) times daily with a meal.   . metoprolol succinate (TOPROL-XL) 25 MG 24 hr tablet Take 1 tablet (25 mg total) by mouth daily.  Marland Kitchen NITROSTAT 0.4 MG SL tablet Take 0.4 mg by mouth every 5 (five) minutes as needed  for chest pain.   . Polyvinyl Alcohol-Povidone (MURINE TEARS FOR DRY EYES OP) Apply 1 drop to eye 3 (three) times daily as needed (dry eyes).  . potassium chloride (K-DUR) 10 MEQ tablet Take 10 mEq by mouth daily.  . Pseudoeph-Doxylamine-DM-APAP (NYQUIL PO) Take 1 Dose by mouth at bedtime.   . simvastatin (ZOCOR) 40 MG tablet Take 40 mg by mouth every evening.  Marland Kitchen SPIRIVA HANDIHALER 18 MCG inhalation capsule Place 18 mcg into inhaler and inhale daily at 8 pm.   . traMADol (ULTRAM) 50 MG tablet Take 50 mg by mouth 3 (three) times daily as needed for severe pain.       Allergies  Allergen Reactions  . Actos [Pioglitazone] Shortness Of Breath    Leg swelling   . Lisinopril Cough    Social History   Socioeconomic History  . Marital status: Single    Spouse name: Not on file  . Number of children: Not on file  . Years of education: Not on file  . Highest education level: Not on file  Occupational History  . Occupation: Retired Set designer  . Financial resource strain: Not on file  . Food insecurity:    Worry: Not on file    Inability: Not on file  . Transportation needs:    Medical: Not on file    Non-medical: Not on file  Tobacco Use  . Smoking status: Light Tobacco Smoker    Packs/day: 0.00    Types: Cigarettes    Last attempt to quit: 08/07/2016    Years since quitting: 1.8  . Smokeless tobacco: Current User    Types: Chew  Substance and Sexual Activity  . Alcohol use: No    Alcohol/week: 0.0 standard drinks    Comment: Weekend drinker, quit 2000. NIGHTLY nyquill  . Drug use: No  . Sexual activity: Not on file  Lifestyle  . Physical activity:    Days per week: Not on file    Minutes per session: Not on file  . Stress: Not on file  Relationships  . Social connections:    Talks on phone: Not on file    Gets together: Not on file    Attends religious service: Not on file    Active member of club or organization: Not on file    Attends meetings of clubs or organizations: Not on file    Relationship status: Not on file  . Intimate partner violence:    Fear of current or ex partner: Not on file    Emotionally abused: Not on file    Physically abused: Not on file    Forced sexual activity: Not on file  Other Topics Concern  . Not on file  Social History Narrative   Lives in [place]. Has 2 roommates, they both smoke. Has 9 dogs.     Review of Systems: General: negative for chills, fever, night sweats or weight changes.  Cardiovascular: negative for chest pain, dyspnea on exertion, edema, orthopnea,  palpitations, paroxysmal nocturnal dyspnea or shortness of breath Dermatological: negative for rash Respiratory: negative for cough or wheezing Urologic: negative for hematuria Abdominal: negative for nausea, vomiting, diarrhea, bright red blood per rectum, melena, or hematemesis Neurologic: negative for visual changes, syncope, or dizziness All other systems reviewed and are otherwise negative except as noted above.    Blood pressure (!) 150/80, pulse 80, height 5\' 1"  (1.549 m), weight 135 lb (61.2 kg).  General appearance: alert and no distress Neck: no adenopathy, no  carotid bruit, no JVD, supple, symmetrical, trachea midline and thyroid not enlarged, symmetric, no tenderness/mass/nodules Lungs: clear to auscultation bilaterally Heart: regular rate and rhythm, S1, S2 normal, no murmur, click, rub or gallop Extremities: Diminished pedal pulses bilaterally Pulses: Minutes pedal pulses bilaterally Skin: Skin color, texture, turgor normal. No rashes or lesions Neurologic: Alert and oriented X 3, normal strength and tone. Normal symmetric reflexes. Normal coordination and gait  EKG not performed today  ASSESSMENT AND PLAN:   Hx of CABG Feb 2010 History of CAD status post bypass grafting by Dr. [name] February 2010 with a LIMA to the LAD, left radial to the first obtuse marginal branch, vein to ramus intermedius and sequential vein to the acute marginal of the distal and distal right coronary artery.  He had a Myoview performed May 2010 which was nonischemic and another Myoview performed in May of this year that showed subtle ischemia but was read as low risk.  He is currently asymptomatic.  Peripheral arterial disease (HCC) History of peripheral arterial disease status post stenting of the left common iliac artery December 2003 and right SFA April 2008 by myself.  He had developed critical limb ischemia and was sent to Dr. Myra Gianotti who opened up an occluded right SFA and placed a  Viabahn covered stent.  Dr. Myra Gianotti currently is following his lower extremity arterial Doppler studies.  Carotid artery disease (HCC) History of carotid artery disease with a known occluded left internal carotid artery artery and moderate right ICA stenosis followed by Dr. Myra Gianotti.  Essential hypertension History of essential hypertension her blood pressure measured today 150/80.  He is on Toprol.  Continue current meds at current dosing.  Hyperlipidemia History of hyperlipidemia not on statin therapy with lipid profile performed 02/04/2018 revealing total cholesterol 120, LDL 60 and HDL of 36.      Runell Gess MD FACP,FACC,FAHA, [facility] 06/15/2018 1:33 PM

## 2018-06-16 DIAGNOSIS — E11622 Type 2 diabetes mellitus with other skin ulcer: Secondary | ICD-10-CM | POA: Diagnosis not present

## 2018-06-16 DIAGNOSIS — L97512 Non-pressure chronic ulcer of other part of right foot with fat layer exposed: Secondary | ICD-10-CM | POA: Diagnosis not present

## 2018-06-16 DIAGNOSIS — J449 Chronic obstructive pulmonary disease, unspecified: Secondary | ICD-10-CM | POA: Diagnosis not present

## 2018-06-16 DIAGNOSIS — L97812 Non-pressure chronic ulcer of other part of right lower leg with fat layer exposed: Secondary | ICD-10-CM | POA: Diagnosis not present

## 2018-06-16 DIAGNOSIS — E1151 Type 2 diabetes mellitus with diabetic peripheral angiopathy without gangrene: Secondary | ICD-10-CM | POA: Diagnosis not present

## 2018-06-16 DIAGNOSIS — E11621 Type 2 diabetes mellitus with foot ulcer: Secondary | ICD-10-CM | POA: Diagnosis not present

## 2018-06-20 DIAGNOSIS — J439 Emphysema, unspecified: Secondary | ICD-10-CM | POA: Diagnosis not present

## 2018-06-20 DIAGNOSIS — E1151 Type 2 diabetes mellitus with diabetic peripheral angiopathy without gangrene: Secondary | ICD-10-CM | POA: Diagnosis not present

## 2018-06-20 DIAGNOSIS — I10 Essential (primary) hypertension: Secondary | ICD-10-CM | POA: Diagnosis not present

## 2018-06-20 DIAGNOSIS — E11622 Type 2 diabetes mellitus with other skin ulcer: Secondary | ICD-10-CM | POA: Diagnosis not present

## 2018-06-20 DIAGNOSIS — L97312 Non-pressure chronic ulcer of right ankle with fat layer exposed: Secondary | ICD-10-CM | POA: Diagnosis not present

## 2018-06-20 DIAGNOSIS — L97211 Non-pressure chronic ulcer of right calf limited to breakdown of skin: Secondary | ICD-10-CM | POA: Diagnosis not present

## 2018-06-23 DIAGNOSIS — I10 Essential (primary) hypertension: Secondary | ICD-10-CM | POA: Diagnosis not present

## 2018-06-23 DIAGNOSIS — L97312 Non-pressure chronic ulcer of right ankle with fat layer exposed: Secondary | ICD-10-CM | POA: Diagnosis not present

## 2018-06-23 DIAGNOSIS — L97211 Non-pressure chronic ulcer of right calf limited to breakdown of skin: Secondary | ICD-10-CM | POA: Diagnosis not present

## 2018-06-23 DIAGNOSIS — J439 Emphysema, unspecified: Secondary | ICD-10-CM | POA: Diagnosis not present

## 2018-06-23 DIAGNOSIS — E1151 Type 2 diabetes mellitus with diabetic peripheral angiopathy without gangrene: Secondary | ICD-10-CM | POA: Diagnosis not present

## 2018-06-23 DIAGNOSIS — E11622 Type 2 diabetes mellitus with other skin ulcer: Secondary | ICD-10-CM | POA: Diagnosis not present

## 2018-06-27 DIAGNOSIS — I10 Essential (primary) hypertension: Secondary | ICD-10-CM | POA: Diagnosis not present

## 2018-06-27 DIAGNOSIS — L97211 Non-pressure chronic ulcer of right calf limited to breakdown of skin: Secondary | ICD-10-CM | POA: Diagnosis not present

## 2018-06-27 DIAGNOSIS — J439 Emphysema, unspecified: Secondary | ICD-10-CM | POA: Diagnosis not present

## 2018-06-27 DIAGNOSIS — L97312 Non-pressure chronic ulcer of right ankle with fat layer exposed: Secondary | ICD-10-CM | POA: Diagnosis not present

## 2018-06-27 DIAGNOSIS — E11622 Type 2 diabetes mellitus with other skin ulcer: Secondary | ICD-10-CM | POA: Diagnosis not present

## 2018-06-27 DIAGNOSIS — E1151 Type 2 diabetes mellitus with diabetic peripheral angiopathy without gangrene: Secondary | ICD-10-CM | POA: Diagnosis not present

## 2018-06-28 ENCOUNTER — Ambulatory Visit (HOSPITAL_COMMUNITY): Payer: Medicare Other | Attending: Family

## 2018-06-28 ENCOUNTER — Ambulatory Visit (HOSPITAL_COMMUNITY): Payer: Medicare Other

## 2018-06-28 ENCOUNTER — Ambulatory Visit: Payer: Medicare Other | Admitting: Family

## 2018-06-29 ENCOUNTER — Encounter: Payer: Self-pay | Admitting: Family

## 2018-06-30 ENCOUNTER — Encounter (HOSPITAL_BASED_OUTPATIENT_CLINIC_OR_DEPARTMENT_OTHER): Payer: Medicare Other | Attending: Internal Medicine

## 2018-06-30 ENCOUNTER — Other Ambulatory Visit: Payer: Self-pay | Admitting: Internal Medicine

## 2018-06-30 DIAGNOSIS — E039 Hypothyroidism, unspecified: Secondary | ICD-10-CM | POA: Diagnosis not present

## 2018-06-30 DIAGNOSIS — L97812 Non-pressure chronic ulcer of other part of right lower leg with fat layer exposed: Secondary | ICD-10-CM | POA: Insufficient documentation

## 2018-06-30 DIAGNOSIS — I10 Essential (primary) hypertension: Secondary | ICD-10-CM | POA: Diagnosis not present

## 2018-06-30 DIAGNOSIS — Z7984 Long term (current) use of oral hypoglycemic drugs: Secondary | ICD-10-CM | POA: Diagnosis not present

## 2018-06-30 DIAGNOSIS — Z9221 Personal history of antineoplastic chemotherapy: Secondary | ICD-10-CM | POA: Insufficient documentation

## 2018-06-30 DIAGNOSIS — E1151 Type 2 diabetes mellitus with diabetic peripheral angiopathy without gangrene: Secondary | ICD-10-CM | POA: Diagnosis not present

## 2018-06-30 DIAGNOSIS — E11622 Type 2 diabetes mellitus with other skin ulcer: Secondary | ICD-10-CM | POA: Insufficient documentation

## 2018-06-30 DIAGNOSIS — I739 Peripheral vascular disease, unspecified: Secondary | ICD-10-CM | POA: Diagnosis not present

## 2018-06-30 DIAGNOSIS — J449 Chronic obstructive pulmonary disease, unspecified: Secondary | ICD-10-CM | POA: Insufficient documentation

## 2018-06-30 DIAGNOSIS — R42 Dizziness and giddiness: Secondary | ICD-10-CM

## 2018-06-30 DIAGNOSIS — I2510 Atherosclerotic heart disease of native coronary artery without angina pectoris: Secondary | ICD-10-CM | POA: Insufficient documentation

## 2018-06-30 DIAGNOSIS — E11621 Type 2 diabetes mellitus with foot ulcer: Secondary | ICD-10-CM | POA: Insufficient documentation

## 2018-06-30 DIAGNOSIS — L97512 Non-pressure chronic ulcer of other part of right foot with fat layer exposed: Secondary | ICD-10-CM | POA: Insufficient documentation

## 2018-06-30 DIAGNOSIS — Z23 Encounter for immunization: Secondary | ICD-10-CM | POA: Diagnosis not present

## 2018-07-01 DIAGNOSIS — I10 Essential (primary) hypertension: Secondary | ICD-10-CM | POA: Diagnosis not present

## 2018-07-01 DIAGNOSIS — L97211 Non-pressure chronic ulcer of right calf limited to breakdown of skin: Secondary | ICD-10-CM | POA: Diagnosis not present

## 2018-07-01 DIAGNOSIS — E11622 Type 2 diabetes mellitus with other skin ulcer: Secondary | ICD-10-CM | POA: Diagnosis not present

## 2018-07-01 DIAGNOSIS — E1151 Type 2 diabetes mellitus with diabetic peripheral angiopathy without gangrene: Secondary | ICD-10-CM | POA: Diagnosis not present

## 2018-07-01 DIAGNOSIS — L97312 Non-pressure chronic ulcer of right ankle with fat layer exposed: Secondary | ICD-10-CM | POA: Diagnosis not present

## 2018-07-01 DIAGNOSIS — J439 Emphysema, unspecified: Secondary | ICD-10-CM | POA: Diagnosis not present

## 2018-07-04 DIAGNOSIS — I10 Essential (primary) hypertension: Secondary | ICD-10-CM | POA: Diagnosis not present

## 2018-07-04 DIAGNOSIS — L97512 Non-pressure chronic ulcer of other part of right foot with fat layer exposed: Secondary | ICD-10-CM | POA: Diagnosis not present

## 2018-07-04 DIAGNOSIS — Z9221 Personal history of antineoplastic chemotherapy: Secondary | ICD-10-CM | POA: Diagnosis not present

## 2018-07-04 DIAGNOSIS — J449 Chronic obstructive pulmonary disease, unspecified: Secondary | ICD-10-CM | POA: Diagnosis not present

## 2018-07-04 DIAGNOSIS — E1151 Type 2 diabetes mellitus with diabetic peripheral angiopathy without gangrene: Secondary | ICD-10-CM | POA: Diagnosis not present

## 2018-07-04 DIAGNOSIS — I2510 Atherosclerotic heart disease of native coronary artery without angina pectoris: Secondary | ICD-10-CM | POA: Diagnosis not present

## 2018-07-04 DIAGNOSIS — S91301A Unspecified open wound, right foot, initial encounter: Secondary | ICD-10-CM | POA: Diagnosis not present

## 2018-07-04 DIAGNOSIS — E11622 Type 2 diabetes mellitus with other skin ulcer: Secondary | ICD-10-CM | POA: Diagnosis not present

## 2018-07-04 DIAGNOSIS — E11621 Type 2 diabetes mellitus with foot ulcer: Secondary | ICD-10-CM | POA: Diagnosis not present

## 2018-07-04 DIAGNOSIS — L97812 Non-pressure chronic ulcer of other part of right lower leg with fat layer exposed: Secondary | ICD-10-CM | POA: Diagnosis not present

## 2018-07-04 DIAGNOSIS — S81801A Unspecified open wound, right lower leg, initial encounter: Secondary | ICD-10-CM | POA: Diagnosis not present

## 2018-07-06 ENCOUNTER — Ambulatory Visit
Admission: RE | Admit: 2018-07-06 | Discharge: 2018-07-06 | Disposition: A | Discharge status: Home / Self Care | Payer: Medicare Other | Source: Ambulatory Visit | Attending: Internal Medicine | Admitting: Internal Medicine

## 2018-07-06 DIAGNOSIS — R42 Dizziness and giddiness: Secondary | ICD-10-CM | POA: Diagnosis not present

## 2018-07-06 IMAGING — CT CT HEAD W/O CM
3 of 4 series · 15 of 47 positions shown, 18 images · non-contrast
Comparison: Dictated report from CT of the brain dated [DATE],
describing no intracranial abnormality at the time.

CLINICAL DATA: Dizziness for 2-3 months, history of leukemia
diagnosed in [42] with chemotherapy. Diabetes.

EXAM:
CT HEAD WITHOUT CONTRAST
TECHNIQUE: Contiguous axial images were obtained from the base of the skull
through the vertex without intravenous contrast.

[Series 2: head 5.00 hr40 s3 ibhc · axial · 0.41mm/px · z∈[-633,-493]mm · 9 of 34 slices shown, 12 images]
[im 3/34  brain]
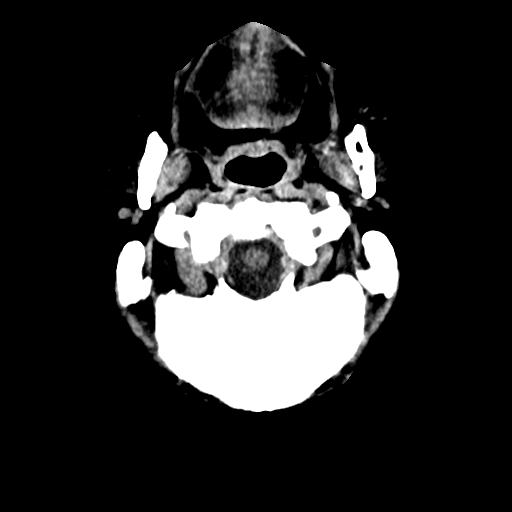
[im 3/34  bone]
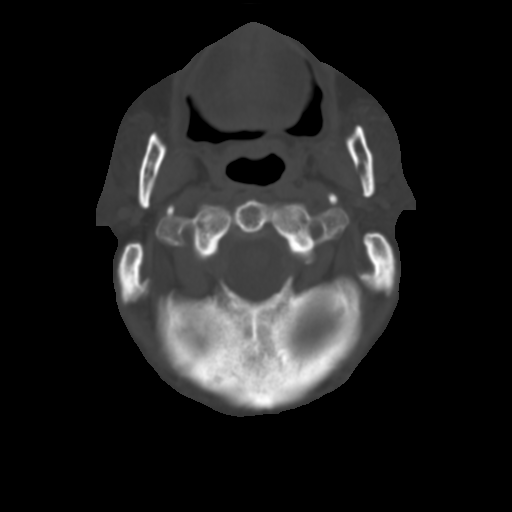
[im 8/34  brain]
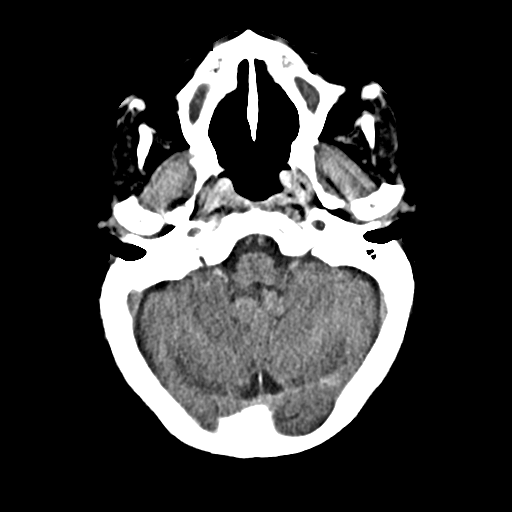
[im 10/34  brain]
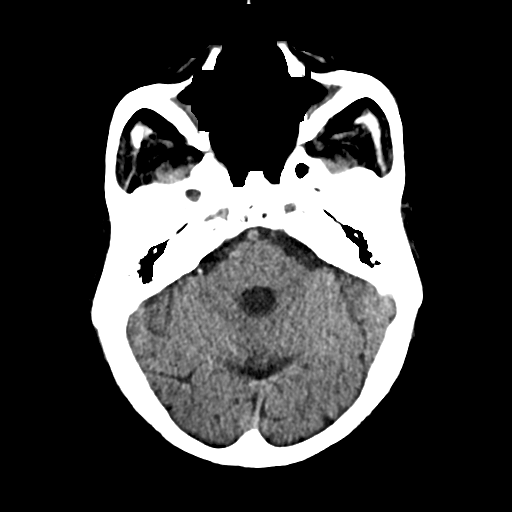
[im 15/34  brain]
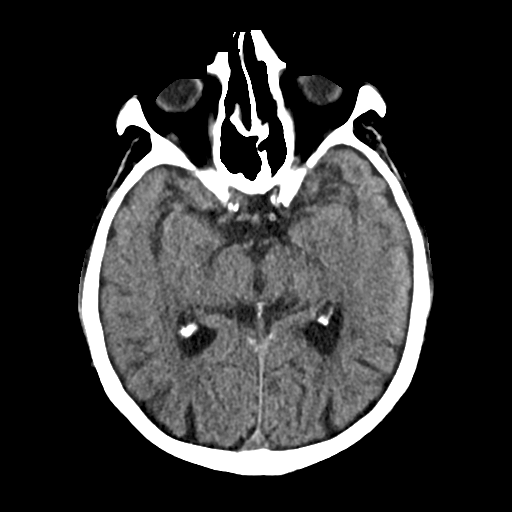
[im 17/34  brain]
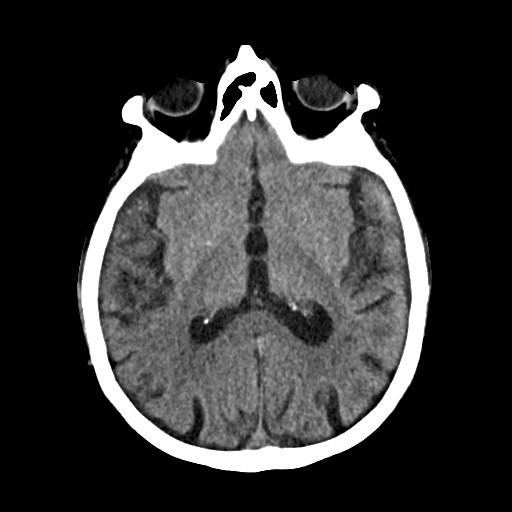
[im 17/34  bone]
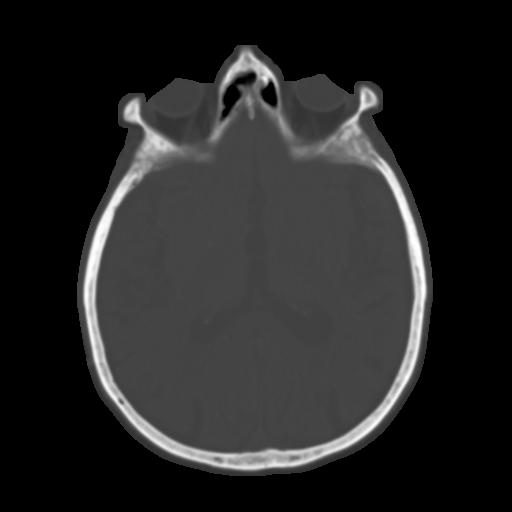
[im 19/34  brain]
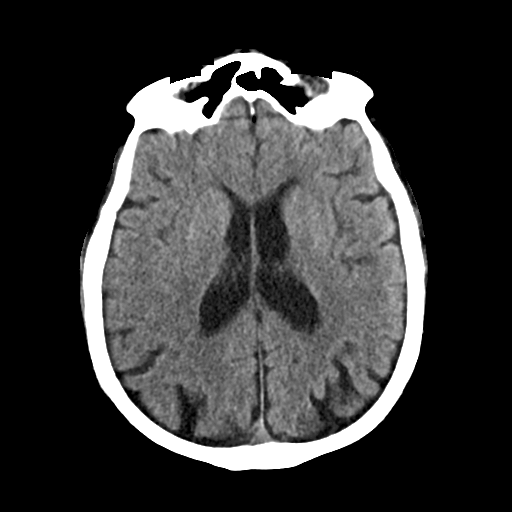
[im 24/34  brain]
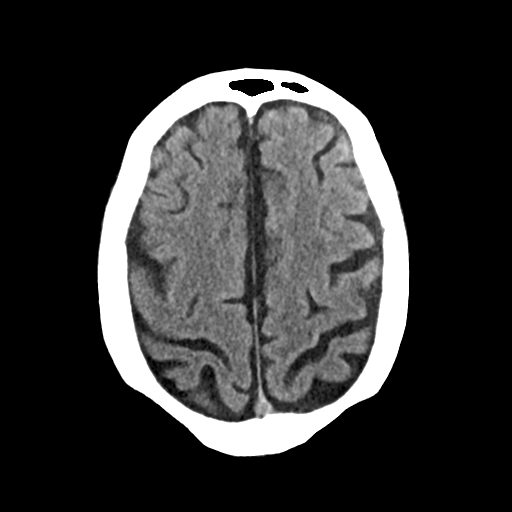
[im 26/34  brain]
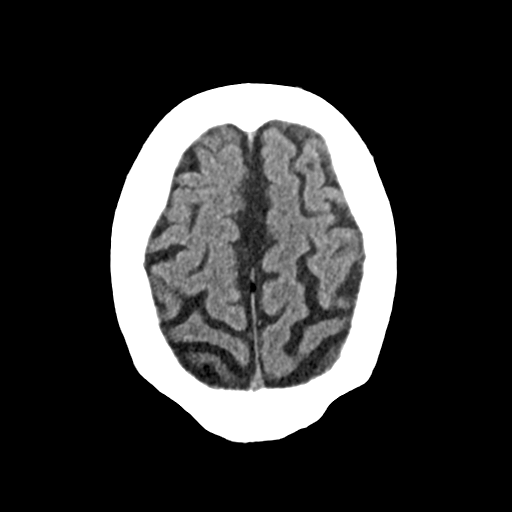
[im 31/34  brain]
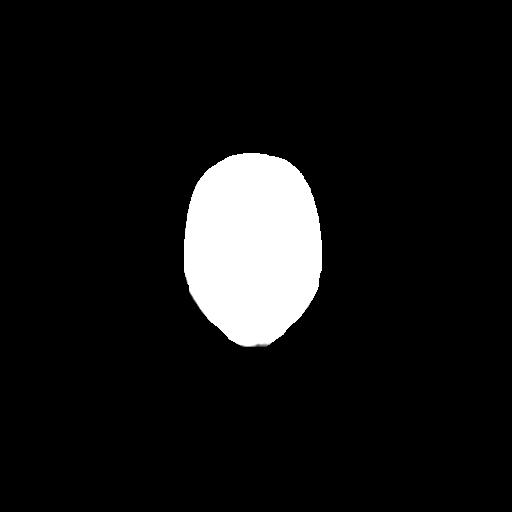
[im 31/34  bone]
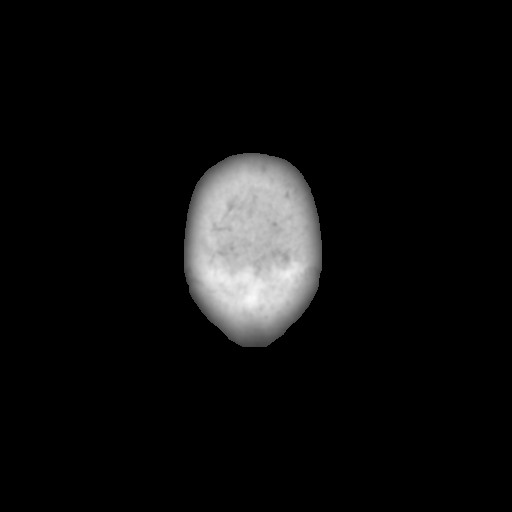

[Series 4: head 3.00 hr40 s3 sag · sagittal · 0.33mm/px · 3 of 57 slices shown]
[im 19/57  brain]
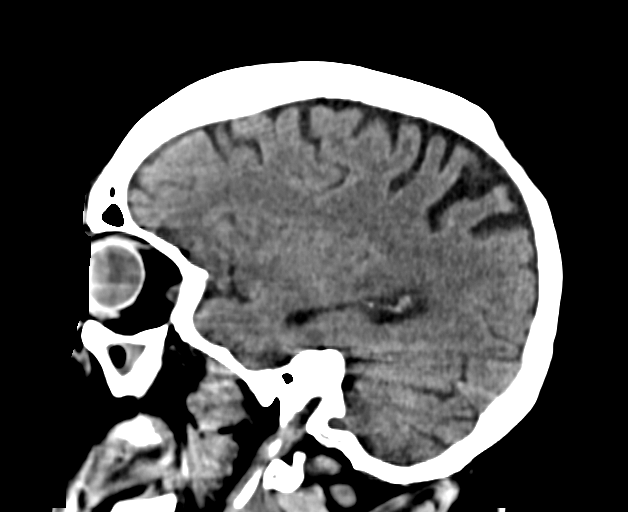
[im 29/57  brain]
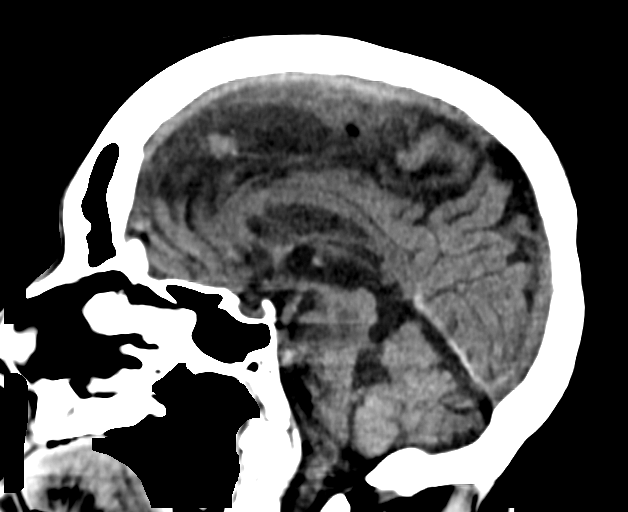
[im 38/57  brain]
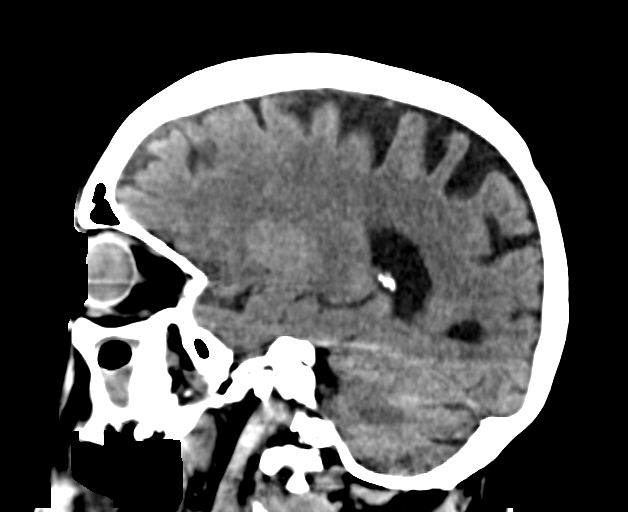

[Series 6: head 3.00 hr40 s3 cor · coronal · 0.33mm/px · 3 of 69 slices shown]
[im 23/69  brain]
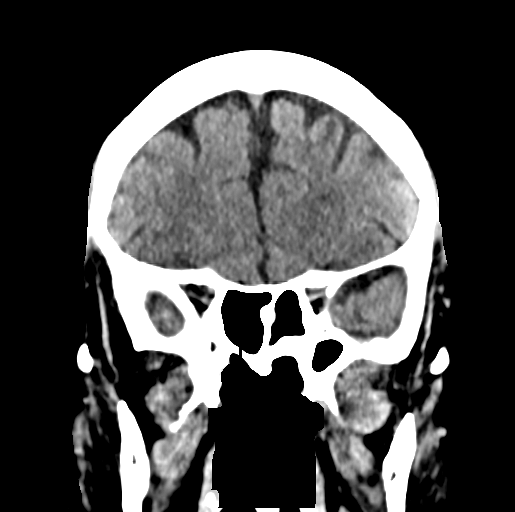
[im 31/69  brain]
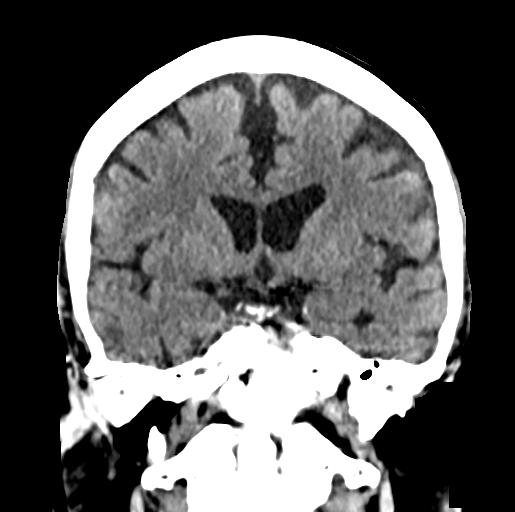
[im 38/69  brain]
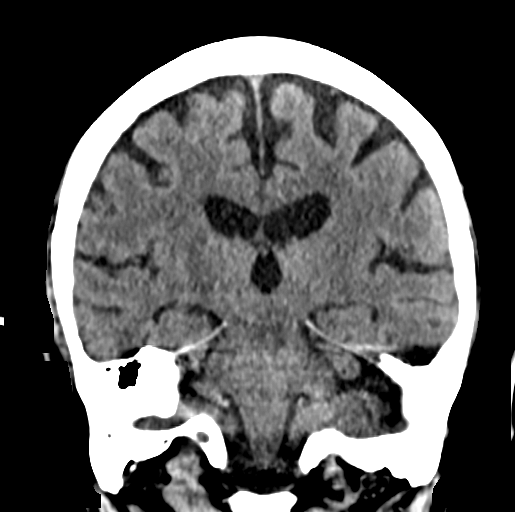

[15 of 47 positions shown; findings below may reference images not displayed]

FINDINGS: Brain: The ventricular system is somewhat prominent as are the
cortical sulci diffusely, consistent with atrophy diffusely.
Slightly prominent CSF space is noted primarily on the left but no
definite subdural is seen. The fourth ventricle and basilar cisterns
are unremarkable. No hemorrhage, mass lesion, or acute infarction is
seen.

Vascular: No vascular abnormality is seen on this unenhanced study.
There is some calcification of the internal carotid arteries and the
vertebral arteries.

Skull: On bone window images, no calvarial abnormality is noted.

Sinuses/Orbits: There appear to be bilateral medial antrostomies
performed, but there is bilateral mucosal edema throughout the
maxillary sinuses with a small amount of fluid on the right
consistent with maxillary sinusitis bilaterally. There is thickening
of the maxillary walls consistent with chronic bilateral maxillary
sinusitis. The ethmoid air cells have been largely removed.

Other: None.
IMPRESSION: 1. Mild atrophy.  No acute intracranial abnormality.
2. Although there have been bilateral medial antrostomies performed,
there is evidence of bilateral maxillary sinusitis, which appears
chronic with thick walled maxillary sinuses bilaterally..

## 2018-07-07 DIAGNOSIS — I10 Essential (primary) hypertension: Secondary | ICD-10-CM | POA: Diagnosis not present

## 2018-07-07 DIAGNOSIS — L97211 Non-pressure chronic ulcer of right calf limited to breakdown of skin: Secondary | ICD-10-CM | POA: Diagnosis not present

## 2018-07-07 DIAGNOSIS — J439 Emphysema, unspecified: Secondary | ICD-10-CM | POA: Diagnosis not present

## 2018-07-07 DIAGNOSIS — L97312 Non-pressure chronic ulcer of right ankle with fat layer exposed: Secondary | ICD-10-CM | POA: Diagnosis not present

## 2018-07-07 DIAGNOSIS — E11622 Type 2 diabetes mellitus with other skin ulcer: Secondary | ICD-10-CM | POA: Diagnosis not present

## 2018-07-07 DIAGNOSIS — E1151 Type 2 diabetes mellitus with diabetic peripheral angiopathy without gangrene: Secondary | ICD-10-CM | POA: Diagnosis not present

## 2018-07-11 DIAGNOSIS — L97211 Non-pressure chronic ulcer of right calf limited to breakdown of skin: Secondary | ICD-10-CM | POA: Diagnosis not present

## 2018-07-11 DIAGNOSIS — L97312 Non-pressure chronic ulcer of right ankle with fat layer exposed: Secondary | ICD-10-CM | POA: Diagnosis not present

## 2018-07-11 DIAGNOSIS — E11622 Type 2 diabetes mellitus with other skin ulcer: Secondary | ICD-10-CM | POA: Diagnosis not present

## 2018-07-11 DIAGNOSIS — J439 Emphysema, unspecified: Secondary | ICD-10-CM | POA: Diagnosis not present

## 2018-07-11 DIAGNOSIS — E1151 Type 2 diabetes mellitus with diabetic peripheral angiopathy without gangrene: Secondary | ICD-10-CM | POA: Diagnosis not present

## 2018-07-11 DIAGNOSIS — I10 Essential (primary) hypertension: Secondary | ICD-10-CM | POA: Diagnosis not present

## 2018-07-14 DIAGNOSIS — E1151 Type 2 diabetes mellitus with diabetic peripheral angiopathy without gangrene: Secondary | ICD-10-CM | POA: Diagnosis not present

## 2018-07-14 DIAGNOSIS — Z7982 Long term (current) use of aspirin: Secondary | ICD-10-CM | POA: Diagnosis not present

## 2018-07-14 DIAGNOSIS — Z4800 Encounter for change or removal of nonsurgical wound dressing: Secondary | ICD-10-CM | POA: Diagnosis not present

## 2018-07-14 DIAGNOSIS — Z7984 Long term (current) use of oral hypoglycemic drugs: Secondary | ICD-10-CM | POA: Diagnosis not present

## 2018-07-14 DIAGNOSIS — L97312 Non-pressure chronic ulcer of right ankle with fat layer exposed: Secondary | ICD-10-CM | POA: Diagnosis not present

## 2018-07-14 DIAGNOSIS — L97112 Non-pressure chronic ulcer of right thigh with fat layer exposed: Secondary | ICD-10-CM | POA: Diagnosis not present

## 2018-07-14 DIAGNOSIS — Z7902 Long term (current) use of antithrombotics/antiplatelets: Secondary | ICD-10-CM | POA: Diagnosis not present

## 2018-07-14 DIAGNOSIS — E11621 Type 2 diabetes mellitus with foot ulcer: Secondary | ICD-10-CM | POA: Diagnosis not present

## 2018-07-18 DIAGNOSIS — Z4800 Encounter for change or removal of nonsurgical wound dressing: Secondary | ICD-10-CM | POA: Diagnosis not present

## 2018-07-18 DIAGNOSIS — E11621 Type 2 diabetes mellitus with foot ulcer: Secondary | ICD-10-CM | POA: Diagnosis not present

## 2018-07-18 DIAGNOSIS — L97312 Non-pressure chronic ulcer of right ankle with fat layer exposed: Secondary | ICD-10-CM | POA: Diagnosis not present

## 2018-07-18 DIAGNOSIS — L97112 Non-pressure chronic ulcer of right thigh with fat layer exposed: Secondary | ICD-10-CM | POA: Diagnosis not present

## 2018-07-18 DIAGNOSIS — E1151 Type 2 diabetes mellitus with diabetic peripheral angiopathy without gangrene: Secondary | ICD-10-CM | POA: Diagnosis not present

## 2018-07-18 DIAGNOSIS — Z7982 Long term (current) use of aspirin: Secondary | ICD-10-CM | POA: Diagnosis not present

## 2018-07-23 DIAGNOSIS — Z4800 Encounter for change or removal of nonsurgical wound dressing: Secondary | ICD-10-CM | POA: Diagnosis not present

## 2018-07-23 DIAGNOSIS — E1151 Type 2 diabetes mellitus with diabetic peripheral angiopathy without gangrene: Secondary | ICD-10-CM | POA: Diagnosis not present

## 2018-07-23 DIAGNOSIS — Z7982 Long term (current) use of aspirin: Secondary | ICD-10-CM | POA: Diagnosis not present

## 2018-07-23 DIAGNOSIS — L97112 Non-pressure chronic ulcer of right thigh with fat layer exposed: Secondary | ICD-10-CM | POA: Diagnosis not present

## 2018-07-23 DIAGNOSIS — E11621 Type 2 diabetes mellitus with foot ulcer: Secondary | ICD-10-CM | POA: Diagnosis not present

## 2018-07-23 DIAGNOSIS — L97312 Non-pressure chronic ulcer of right ankle with fat layer exposed: Secondary | ICD-10-CM | POA: Diagnosis not present

## 2018-07-25 DIAGNOSIS — Z4800 Encounter for change or removal of nonsurgical wound dressing: Secondary | ICD-10-CM | POA: Diagnosis not present

## 2018-07-25 DIAGNOSIS — E1151 Type 2 diabetes mellitus with diabetic peripheral angiopathy without gangrene: Secondary | ICD-10-CM | POA: Diagnosis not present

## 2018-07-25 DIAGNOSIS — Z7982 Long term (current) use of aspirin: Secondary | ICD-10-CM | POA: Diagnosis not present

## 2018-07-25 DIAGNOSIS — L97312 Non-pressure chronic ulcer of right ankle with fat layer exposed: Secondary | ICD-10-CM | POA: Diagnosis not present

## 2018-07-25 DIAGNOSIS — E11621 Type 2 diabetes mellitus with foot ulcer: Secondary | ICD-10-CM | POA: Diagnosis not present

## 2018-07-25 DIAGNOSIS — L97112 Non-pressure chronic ulcer of right thigh with fat layer exposed: Secondary | ICD-10-CM | POA: Diagnosis not present

## 2018-07-28 DIAGNOSIS — Z7982 Long term (current) use of aspirin: Secondary | ICD-10-CM | POA: Diagnosis not present

## 2018-07-28 DIAGNOSIS — E1151 Type 2 diabetes mellitus with diabetic peripheral angiopathy without gangrene: Secondary | ICD-10-CM | POA: Diagnosis not present

## 2018-07-28 DIAGNOSIS — E11621 Type 2 diabetes mellitus with foot ulcer: Secondary | ICD-10-CM | POA: Diagnosis not present

## 2018-07-28 DIAGNOSIS — L97312 Non-pressure chronic ulcer of right ankle with fat layer exposed: Secondary | ICD-10-CM | POA: Diagnosis not present

## 2018-07-28 DIAGNOSIS — Z4800 Encounter for change or removal of nonsurgical wound dressing: Secondary | ICD-10-CM | POA: Diagnosis not present

## 2018-07-28 DIAGNOSIS — L97112 Non-pressure chronic ulcer of right thigh with fat layer exposed: Secondary | ICD-10-CM | POA: Diagnosis not present

## 2018-07-29 DIAGNOSIS — Z4800 Encounter for change or removal of nonsurgical wound dressing: Secondary | ICD-10-CM | POA: Diagnosis not present

## 2018-07-29 DIAGNOSIS — L97112 Non-pressure chronic ulcer of right thigh with fat layer exposed: Secondary | ICD-10-CM | POA: Diagnosis not present

## 2018-07-29 DIAGNOSIS — E1151 Type 2 diabetes mellitus with diabetic peripheral angiopathy without gangrene: Secondary | ICD-10-CM | POA: Diagnosis not present

## 2018-07-29 DIAGNOSIS — E11621 Type 2 diabetes mellitus with foot ulcer: Secondary | ICD-10-CM | POA: Diagnosis not present

## 2018-07-29 DIAGNOSIS — L97312 Non-pressure chronic ulcer of right ankle with fat layer exposed: Secondary | ICD-10-CM | POA: Diagnosis not present

## 2018-07-29 DIAGNOSIS — Z7982 Long term (current) use of aspirin: Secondary | ICD-10-CM | POA: Diagnosis not present

## 2018-08-01 DIAGNOSIS — Z7982 Long term (current) use of aspirin: Secondary | ICD-10-CM | POA: Diagnosis not present

## 2018-08-01 DIAGNOSIS — E11621 Type 2 diabetes mellitus with foot ulcer: Secondary | ICD-10-CM | POA: Diagnosis not present

## 2018-08-01 DIAGNOSIS — E1151 Type 2 diabetes mellitus with diabetic peripheral angiopathy without gangrene: Secondary | ICD-10-CM | POA: Diagnosis not present

## 2018-08-01 DIAGNOSIS — L97112 Non-pressure chronic ulcer of right thigh with fat layer exposed: Secondary | ICD-10-CM | POA: Diagnosis not present

## 2018-08-01 DIAGNOSIS — Z4800 Encounter for change or removal of nonsurgical wound dressing: Secondary | ICD-10-CM | POA: Diagnosis not present

## 2018-08-01 DIAGNOSIS — L97312 Non-pressure chronic ulcer of right ankle with fat layer exposed: Secondary | ICD-10-CM | POA: Diagnosis not present

## 2018-08-04 DIAGNOSIS — E11621 Type 2 diabetes mellitus with foot ulcer: Secondary | ICD-10-CM | POA: Diagnosis not present

## 2018-08-04 DIAGNOSIS — Z4800 Encounter for change or removal of nonsurgical wound dressing: Secondary | ICD-10-CM | POA: Diagnosis not present

## 2018-08-04 DIAGNOSIS — Z7982 Long term (current) use of aspirin: Secondary | ICD-10-CM | POA: Diagnosis not present

## 2018-08-04 DIAGNOSIS — L97312 Non-pressure chronic ulcer of right ankle with fat layer exposed: Secondary | ICD-10-CM | POA: Diagnosis not present

## 2018-08-04 DIAGNOSIS — E1151 Type 2 diabetes mellitus with diabetic peripheral angiopathy without gangrene: Secondary | ICD-10-CM | POA: Diagnosis not present

## 2018-08-04 DIAGNOSIS — L97112 Non-pressure chronic ulcer of right thigh with fat layer exposed: Secondary | ICD-10-CM | POA: Diagnosis not present

## 2018-08-08 DIAGNOSIS — L97312 Non-pressure chronic ulcer of right ankle with fat layer exposed: Secondary | ICD-10-CM | POA: Diagnosis not present

## 2018-08-08 DIAGNOSIS — Z4800 Encounter for change or removal of nonsurgical wound dressing: Secondary | ICD-10-CM | POA: Diagnosis not present

## 2018-08-08 DIAGNOSIS — E11621 Type 2 diabetes mellitus with foot ulcer: Secondary | ICD-10-CM | POA: Diagnosis not present

## 2018-08-08 DIAGNOSIS — Z7982 Long term (current) use of aspirin: Secondary | ICD-10-CM | POA: Diagnosis not present

## 2018-08-08 DIAGNOSIS — L97112 Non-pressure chronic ulcer of right thigh with fat layer exposed: Secondary | ICD-10-CM | POA: Diagnosis not present

## 2018-08-08 DIAGNOSIS — E1151 Type 2 diabetes mellitus with diabetic peripheral angiopathy without gangrene: Secondary | ICD-10-CM | POA: Diagnosis not present

## 2018-08-11 DIAGNOSIS — E11621 Type 2 diabetes mellitus with foot ulcer: Secondary | ICD-10-CM | POA: Diagnosis not present

## 2018-08-11 DIAGNOSIS — L97112 Non-pressure chronic ulcer of right thigh with fat layer exposed: Secondary | ICD-10-CM | POA: Diagnosis not present

## 2018-08-11 DIAGNOSIS — Z4800 Encounter for change or removal of nonsurgical wound dressing: Secondary | ICD-10-CM | POA: Diagnosis not present

## 2018-08-11 DIAGNOSIS — Z7982 Long term (current) use of aspirin: Secondary | ICD-10-CM | POA: Diagnosis not present

## 2018-08-11 DIAGNOSIS — L97312 Non-pressure chronic ulcer of right ankle with fat layer exposed: Secondary | ICD-10-CM | POA: Diagnosis not present

## 2018-08-11 DIAGNOSIS — E1151 Type 2 diabetes mellitus with diabetic peripheral angiopathy without gangrene: Secondary | ICD-10-CM | POA: Diagnosis not present

## 2018-08-15 DIAGNOSIS — L97112 Non-pressure chronic ulcer of right thigh with fat layer exposed: Secondary | ICD-10-CM | POA: Diagnosis not present

## 2018-08-15 DIAGNOSIS — L97312 Non-pressure chronic ulcer of right ankle with fat layer exposed: Secondary | ICD-10-CM | POA: Diagnosis not present

## 2018-08-15 DIAGNOSIS — E1151 Type 2 diabetes mellitus with diabetic peripheral angiopathy without gangrene: Secondary | ICD-10-CM | POA: Diagnosis not present

## 2018-08-15 DIAGNOSIS — Z7982 Long term (current) use of aspirin: Secondary | ICD-10-CM | POA: Diagnosis not present

## 2018-08-15 DIAGNOSIS — Z4800 Encounter for change or removal of nonsurgical wound dressing: Secondary | ICD-10-CM | POA: Diagnosis not present

## 2018-08-15 DIAGNOSIS — E11621 Type 2 diabetes mellitus with foot ulcer: Secondary | ICD-10-CM | POA: Diagnosis not present

## 2018-08-18 DIAGNOSIS — L97312 Non-pressure chronic ulcer of right ankle with fat layer exposed: Secondary | ICD-10-CM | POA: Diagnosis not present

## 2018-08-18 DIAGNOSIS — L97112 Non-pressure chronic ulcer of right thigh with fat layer exposed: Secondary | ICD-10-CM | POA: Diagnosis not present

## 2018-08-18 DIAGNOSIS — Z7982 Long term (current) use of aspirin: Secondary | ICD-10-CM | POA: Diagnosis not present

## 2018-08-18 DIAGNOSIS — E1151 Type 2 diabetes mellitus with diabetic peripheral angiopathy without gangrene: Secondary | ICD-10-CM | POA: Diagnosis not present

## 2018-08-18 DIAGNOSIS — E11621 Type 2 diabetes mellitus with foot ulcer: Secondary | ICD-10-CM | POA: Diagnosis not present

## 2018-08-18 DIAGNOSIS — Z4800 Encounter for change or removal of nonsurgical wound dressing: Secondary | ICD-10-CM | POA: Diagnosis not present

## 2018-08-23 DIAGNOSIS — L97112 Non-pressure chronic ulcer of right thigh with fat layer exposed: Secondary | ICD-10-CM | POA: Diagnosis not present

## 2018-08-23 DIAGNOSIS — E1151 Type 2 diabetes mellitus with diabetic peripheral angiopathy without gangrene: Secondary | ICD-10-CM | POA: Diagnosis not present

## 2018-08-23 DIAGNOSIS — E11621 Type 2 diabetes mellitus with foot ulcer: Secondary | ICD-10-CM | POA: Diagnosis not present

## 2018-08-23 DIAGNOSIS — Z7982 Long term (current) use of aspirin: Secondary | ICD-10-CM | POA: Diagnosis not present

## 2018-08-23 DIAGNOSIS — Z4800 Encounter for change or removal of nonsurgical wound dressing: Secondary | ICD-10-CM | POA: Diagnosis not present

## 2018-08-23 DIAGNOSIS — L97312 Non-pressure chronic ulcer of right ankle with fat layer exposed: Secondary | ICD-10-CM | POA: Diagnosis not present

## 2018-08-25 DIAGNOSIS — L97312 Non-pressure chronic ulcer of right ankle with fat layer exposed: Secondary | ICD-10-CM | POA: Diagnosis not present

## 2018-08-25 DIAGNOSIS — E11621 Type 2 diabetes mellitus with foot ulcer: Secondary | ICD-10-CM | POA: Diagnosis not present

## 2018-08-25 DIAGNOSIS — Z4800 Encounter for change or removal of nonsurgical wound dressing: Secondary | ICD-10-CM | POA: Diagnosis not present

## 2018-08-25 DIAGNOSIS — L97112 Non-pressure chronic ulcer of right thigh with fat layer exposed: Secondary | ICD-10-CM | POA: Diagnosis not present

## 2018-08-25 DIAGNOSIS — Z7982 Long term (current) use of aspirin: Secondary | ICD-10-CM | POA: Diagnosis not present

## 2018-08-25 DIAGNOSIS — E1151 Type 2 diabetes mellitus with diabetic peripheral angiopathy without gangrene: Secondary | ICD-10-CM | POA: Diagnosis not present

## 2018-08-29 DIAGNOSIS — Z7982 Long term (current) use of aspirin: Secondary | ICD-10-CM | POA: Diagnosis not present

## 2018-08-29 DIAGNOSIS — L97312 Non-pressure chronic ulcer of right ankle with fat layer exposed: Secondary | ICD-10-CM | POA: Diagnosis not present

## 2018-08-29 DIAGNOSIS — E11621 Type 2 diabetes mellitus with foot ulcer: Secondary | ICD-10-CM | POA: Diagnosis not present

## 2018-08-29 DIAGNOSIS — E1151 Type 2 diabetes mellitus with diabetic peripheral angiopathy without gangrene: Secondary | ICD-10-CM | POA: Diagnosis not present

## 2018-08-29 DIAGNOSIS — L97112 Non-pressure chronic ulcer of right thigh with fat layer exposed: Secondary | ICD-10-CM | POA: Diagnosis not present

## 2018-08-29 DIAGNOSIS — Z4800 Encounter for change or removal of nonsurgical wound dressing: Secondary | ICD-10-CM | POA: Diagnosis not present

## 2018-09-01 ENCOUNTER — Other Ambulatory Visit: Payer: Self-pay | Admitting: Cardiovascular Disease

## 2018-09-01 ENCOUNTER — Encounter (HOSPITAL_BASED_OUTPATIENT_CLINIC_OR_DEPARTMENT_OTHER): Payer: Medicare Other | Attending: Internal Medicine

## 2018-09-01 DIAGNOSIS — J449 Chronic obstructive pulmonary disease, unspecified: Secondary | ICD-10-CM | POA: Diagnosis not present

## 2018-09-01 DIAGNOSIS — Z9221 Personal history of antineoplastic chemotherapy: Secondary | ICD-10-CM | POA: Insufficient documentation

## 2018-09-01 DIAGNOSIS — E1151 Type 2 diabetes mellitus with diabetic peripheral angiopathy without gangrene: Secondary | ICD-10-CM | POA: Insufficient documentation

## 2018-09-01 DIAGNOSIS — L97512 Non-pressure chronic ulcer of other part of right foot with fat layer exposed: Secondary | ICD-10-CM | POA: Insufficient documentation

## 2018-09-01 DIAGNOSIS — I10 Essential (primary) hypertension: Secondary | ICD-10-CM | POA: Insufficient documentation

## 2018-09-01 DIAGNOSIS — E11621 Type 2 diabetes mellitus with foot ulcer: Secondary | ICD-10-CM | POA: Insufficient documentation

## 2018-09-01 DIAGNOSIS — L97812 Non-pressure chronic ulcer of other part of right lower leg with fat layer exposed: Secondary | ICD-10-CM | POA: Insufficient documentation

## 2018-09-01 DIAGNOSIS — E11622 Type 2 diabetes mellitus with other skin ulcer: Secondary | ICD-10-CM | POA: Diagnosis not present

## 2018-09-01 DIAGNOSIS — S81801A Unspecified open wound, right lower leg, initial encounter: Secondary | ICD-10-CM | POA: Diagnosis not present

## 2018-09-01 DIAGNOSIS — S91301A Unspecified open wound, right foot, initial encounter: Secondary | ICD-10-CM | POA: Diagnosis not present

## 2018-09-01 DIAGNOSIS — I2510 Atherosclerotic heart disease of native coronary artery without angina pectoris: Secondary | ICD-10-CM | POA: Insufficient documentation

## 2018-09-05 DIAGNOSIS — L97312 Non-pressure chronic ulcer of right ankle with fat layer exposed: Secondary | ICD-10-CM | POA: Diagnosis not present

## 2018-09-05 DIAGNOSIS — Z7982 Long term (current) use of aspirin: Secondary | ICD-10-CM | POA: Diagnosis not present

## 2018-09-05 DIAGNOSIS — E1151 Type 2 diabetes mellitus with diabetic peripheral angiopathy without gangrene: Secondary | ICD-10-CM | POA: Diagnosis not present

## 2018-09-05 DIAGNOSIS — E11621 Type 2 diabetes mellitus with foot ulcer: Secondary | ICD-10-CM | POA: Diagnosis not present

## 2018-09-05 DIAGNOSIS — L97112 Non-pressure chronic ulcer of right thigh with fat layer exposed: Secondary | ICD-10-CM | POA: Diagnosis not present

## 2018-09-05 DIAGNOSIS — Z4800 Encounter for change or removal of nonsurgical wound dressing: Secondary | ICD-10-CM | POA: Diagnosis not present

## 2018-09-08 DIAGNOSIS — L97312 Non-pressure chronic ulcer of right ankle with fat layer exposed: Secondary | ICD-10-CM | POA: Diagnosis not present

## 2018-09-08 DIAGNOSIS — L97112 Non-pressure chronic ulcer of right thigh with fat layer exposed: Secondary | ICD-10-CM | POA: Diagnosis not present

## 2018-09-08 DIAGNOSIS — E11621 Type 2 diabetes mellitus with foot ulcer: Secondary | ICD-10-CM | POA: Diagnosis not present

## 2018-09-08 DIAGNOSIS — E1151 Type 2 diabetes mellitus with diabetic peripheral angiopathy without gangrene: Secondary | ICD-10-CM | POA: Diagnosis not present

## 2018-09-08 DIAGNOSIS — Z4800 Encounter for change or removal of nonsurgical wound dressing: Secondary | ICD-10-CM | POA: Diagnosis not present

## 2018-09-08 DIAGNOSIS — Z7982 Long term (current) use of aspirin: Secondary | ICD-10-CM | POA: Diagnosis not present

## 2018-09-15 DIAGNOSIS — E11622 Type 2 diabetes mellitus with other skin ulcer: Secondary | ICD-10-CM | POA: Diagnosis not present

## 2018-09-15 DIAGNOSIS — E11621 Type 2 diabetes mellitus with foot ulcer: Secondary | ICD-10-CM | POA: Diagnosis not present

## 2018-09-15 DIAGNOSIS — J449 Chronic obstructive pulmonary disease, unspecified: Secondary | ICD-10-CM | POA: Diagnosis not present

## 2018-09-15 DIAGNOSIS — E1151 Type 2 diabetes mellitus with diabetic peripheral angiopathy without gangrene: Secondary | ICD-10-CM | POA: Diagnosis not present

## 2018-09-15 DIAGNOSIS — L97812 Non-pressure chronic ulcer of other part of right lower leg with fat layer exposed: Secondary | ICD-10-CM | POA: Diagnosis not present

## 2018-09-15 DIAGNOSIS — L97512 Non-pressure chronic ulcer of other part of right foot with fat layer exposed: Secondary | ICD-10-CM | POA: Diagnosis not present

## 2018-09-15 DIAGNOSIS — I998 Other disorder of circulatory system: Secondary | ICD-10-CM | POA: Diagnosis not present

## 2018-09-26 DIAGNOSIS — E11319 Type 2 diabetes mellitus with unspecified diabetic retinopathy without macular edema: Secondary | ICD-10-CM | POA: Diagnosis not present

## 2018-09-26 DIAGNOSIS — R0981 Nasal congestion: Secondary | ICD-10-CM | POA: Diagnosis not present

## 2018-09-26 DIAGNOSIS — I10 Essential (primary) hypertension: Secondary | ICD-10-CM | POA: Diagnosis not present

## 2018-09-26 DIAGNOSIS — E1151 Type 2 diabetes mellitus with diabetic peripheral angiopathy without gangrene: Secondary | ICD-10-CM | POA: Diagnosis not present

## 2018-09-26 DIAGNOSIS — I739 Peripheral vascular disease, unspecified: Secondary | ICD-10-CM | POA: Diagnosis not present

## 2018-09-26 DIAGNOSIS — E039 Hypothyroidism, unspecified: Secondary | ICD-10-CM | POA: Diagnosis not present

## 2018-09-26 DIAGNOSIS — E7800 Pure hypercholesterolemia, unspecified: Secondary | ICD-10-CM | POA: Diagnosis not present

## 2018-09-26 DIAGNOSIS — N183 Chronic kidney disease, stage 3 (moderate): Secondary | ICD-10-CM | POA: Diagnosis not present

## 2018-09-26 DIAGNOSIS — I2510 Atherosclerotic heart disease of native coronary artery without angina pectoris: Secondary | ICD-10-CM | POA: Diagnosis not present

## 2018-09-29 ENCOUNTER — Encounter (HOSPITAL_BASED_OUTPATIENT_CLINIC_OR_DEPARTMENT_OTHER): Payer: Medicare Other | Attending: Internal Medicine

## 2018-09-29 DIAGNOSIS — E1151 Type 2 diabetes mellitus with diabetic peripheral angiopathy without gangrene: Secondary | ICD-10-CM | POA: Insufficient documentation

## 2018-09-29 DIAGNOSIS — I2510 Atherosclerotic heart disease of native coronary artery without angina pectoris: Secondary | ICD-10-CM | POA: Insufficient documentation

## 2018-09-29 DIAGNOSIS — I10 Essential (primary) hypertension: Secondary | ICD-10-CM | POA: Diagnosis not present

## 2018-09-29 DIAGNOSIS — S91301A Unspecified open wound, right foot, initial encounter: Secondary | ICD-10-CM | POA: Diagnosis not present

## 2018-09-29 DIAGNOSIS — L97812 Non-pressure chronic ulcer of other part of right lower leg with fat layer exposed: Secondary | ICD-10-CM | POA: Insufficient documentation

## 2018-09-29 DIAGNOSIS — L97512 Non-pressure chronic ulcer of other part of right foot with fat layer exposed: Secondary | ICD-10-CM | POA: Diagnosis not present

## 2018-09-29 DIAGNOSIS — E11621 Type 2 diabetes mellitus with foot ulcer: Secondary | ICD-10-CM | POA: Diagnosis not present

## 2018-09-29 DIAGNOSIS — E11622 Type 2 diabetes mellitus with other skin ulcer: Secondary | ICD-10-CM | POA: Insufficient documentation

## 2018-09-29 DIAGNOSIS — S81801A Unspecified open wound, right lower leg, initial encounter: Secondary | ICD-10-CM | POA: Diagnosis not present

## 2018-09-29 DIAGNOSIS — J449 Chronic obstructive pulmonary disease, unspecified: Secondary | ICD-10-CM | POA: Insufficient documentation

## 2018-09-29 DIAGNOSIS — Z9221 Personal history of antineoplastic chemotherapy: Secondary | ICD-10-CM | POA: Insufficient documentation

## 2018-10-03 DIAGNOSIS — J3489 Other specified disorders of nose and nasal sinuses: Secondary | ICD-10-CM | POA: Diagnosis not present

## 2018-10-03 DIAGNOSIS — H905 Unspecified sensorineural hearing loss: Secondary | ICD-10-CM | POA: Insufficient documentation

## 2018-10-03 DIAGNOSIS — R42 Dizziness and giddiness: Secondary | ICD-10-CM | POA: Diagnosis not present

## 2018-10-03 DIAGNOSIS — H6122 Impacted cerumen, left ear: Secondary | ICD-10-CM | POA: Insufficient documentation

## 2018-10-03 DIAGNOSIS — H90A21 Sensorineural hearing loss, unilateral, right ear, with restricted hearing on the contralateral side: Secondary | ICD-10-CM | POA: Diagnosis not present

## 2018-10-03 DIAGNOSIS — F17290 Nicotine dependence, other tobacco product, uncomplicated: Secondary | ICD-10-CM | POA: Diagnosis not present

## 2018-10-13 DIAGNOSIS — S81801A Unspecified open wound, right lower leg, initial encounter: Secondary | ICD-10-CM | POA: Diagnosis not present

## 2018-10-13 DIAGNOSIS — L97812 Non-pressure chronic ulcer of other part of right lower leg with fat layer exposed: Secondary | ICD-10-CM | POA: Diagnosis not present

## 2018-10-13 DIAGNOSIS — L97512 Non-pressure chronic ulcer of other part of right foot with fat layer exposed: Secondary | ICD-10-CM | POA: Diagnosis not present

## 2018-10-13 DIAGNOSIS — E11622 Type 2 diabetes mellitus with other skin ulcer: Secondary | ICD-10-CM | POA: Diagnosis not present

## 2018-10-13 DIAGNOSIS — S91301A Unspecified open wound, right foot, initial encounter: Secondary | ICD-10-CM | POA: Diagnosis not present

## 2018-10-13 DIAGNOSIS — E11621 Type 2 diabetes mellitus with foot ulcer: Secondary | ICD-10-CM | POA: Diagnosis not present

## 2018-10-13 DIAGNOSIS — J449 Chronic obstructive pulmonary disease, unspecified: Secondary | ICD-10-CM | POA: Diagnosis not present

## 2018-10-13 DIAGNOSIS — E1151 Type 2 diabetes mellitus with diabetic peripheral angiopathy without gangrene: Secondary | ICD-10-CM | POA: Diagnosis not present

## 2018-10-17 DIAGNOSIS — H90A22 Sensorineural hearing loss, unilateral, left ear, with restricted hearing on the contralateral side: Secondary | ICD-10-CM | POA: Diagnosis not present

## 2018-10-17 DIAGNOSIS — H90A31 Mixed conductive and sensorineural hearing loss, unilateral, right ear with restricted hearing on the contralateral side: Secondary | ICD-10-CM | POA: Diagnosis not present

## 2018-10-17 DIAGNOSIS — R42 Dizziness and giddiness: Secondary | ICD-10-CM | POA: Diagnosis not present

## 2018-10-27 ENCOUNTER — Encounter (HOSPITAL_BASED_OUTPATIENT_CLINIC_OR_DEPARTMENT_OTHER): Payer: Medicare Other | Attending: Internal Medicine

## 2018-10-27 ENCOUNTER — Encounter (HOSPITAL_BASED_OUTPATIENT_CLINIC_OR_DEPARTMENT_OTHER): Payer: Self-pay

## 2018-10-27 DIAGNOSIS — E11621 Type 2 diabetes mellitus with foot ulcer: Secondary | ICD-10-CM | POA: Diagnosis not present

## 2018-10-27 DIAGNOSIS — I2510 Atherosclerotic heart disease of native coronary artery without angina pectoris: Secondary | ICD-10-CM | POA: Diagnosis not present

## 2018-10-27 DIAGNOSIS — I10 Essential (primary) hypertension: Secondary | ICD-10-CM | POA: Diagnosis not present

## 2018-10-27 DIAGNOSIS — L97512 Non-pressure chronic ulcer of other part of right foot with fat layer exposed: Secondary | ICD-10-CM | POA: Diagnosis not present

## 2018-10-27 DIAGNOSIS — Z9221 Personal history of antineoplastic chemotherapy: Secondary | ICD-10-CM | POA: Diagnosis not present

## 2018-10-27 DIAGNOSIS — E1151 Type 2 diabetes mellitus with diabetic peripheral angiopathy without gangrene: Secondary | ICD-10-CM | POA: Diagnosis not present

## 2018-10-27 DIAGNOSIS — E11622 Type 2 diabetes mellitus with other skin ulcer: Secondary | ICD-10-CM | POA: Insufficient documentation

## 2018-10-27 DIAGNOSIS — L97812 Non-pressure chronic ulcer of other part of right lower leg with fat layer exposed: Secondary | ICD-10-CM | POA: Diagnosis not present

## 2018-10-27 DIAGNOSIS — J449 Chronic obstructive pulmonary disease, unspecified: Secondary | ICD-10-CM | POA: Diagnosis not present

## 2018-11-24 ENCOUNTER — Encounter (HOSPITAL_BASED_OUTPATIENT_CLINIC_OR_DEPARTMENT_OTHER): Payer: Medicare Other | Attending: Internal Medicine

## 2018-11-24 DIAGNOSIS — E11621 Type 2 diabetes mellitus with foot ulcer: Secondary | ICD-10-CM | POA: Insufficient documentation

## 2018-11-24 DIAGNOSIS — L97512 Non-pressure chronic ulcer of other part of right foot with fat layer exposed: Secondary | ICD-10-CM | POA: Insufficient documentation

## 2018-11-24 DIAGNOSIS — I10 Essential (primary) hypertension: Secondary | ICD-10-CM | POA: Insufficient documentation

## 2018-11-24 DIAGNOSIS — E11622 Type 2 diabetes mellitus with other skin ulcer: Secondary | ICD-10-CM | POA: Insufficient documentation

## 2018-11-24 DIAGNOSIS — L97812 Non-pressure chronic ulcer of other part of right lower leg with fat layer exposed: Secondary | ICD-10-CM | POA: Insufficient documentation

## 2018-11-24 DIAGNOSIS — E1151 Type 2 diabetes mellitus with diabetic peripheral angiopathy without gangrene: Secondary | ICD-10-CM | POA: Insufficient documentation

## 2018-11-24 DIAGNOSIS — J449 Chronic obstructive pulmonary disease, unspecified: Secondary | ICD-10-CM | POA: Insufficient documentation

## 2018-11-24 DIAGNOSIS — Z9221 Personal history of antineoplastic chemotherapy: Secondary | ICD-10-CM | POA: Insufficient documentation

## 2018-12-08 ENCOUNTER — Other Ambulatory Visit: Payer: Self-pay

## 2018-12-08 DIAGNOSIS — Z9221 Personal history of antineoplastic chemotherapy: Secondary | ICD-10-CM | POA: Diagnosis not present

## 2018-12-08 DIAGNOSIS — J449 Chronic obstructive pulmonary disease, unspecified: Secondary | ICD-10-CM | POA: Diagnosis not present

## 2018-12-08 DIAGNOSIS — E11622 Type 2 diabetes mellitus with other skin ulcer: Secondary | ICD-10-CM | POA: Diagnosis not present

## 2018-12-08 DIAGNOSIS — E1151 Type 2 diabetes mellitus with diabetic peripheral angiopathy without gangrene: Secondary | ICD-10-CM | POA: Diagnosis not present

## 2018-12-08 DIAGNOSIS — I10 Essential (primary) hypertension: Secondary | ICD-10-CM | POA: Diagnosis not present

## 2018-12-08 DIAGNOSIS — L97812 Non-pressure chronic ulcer of other part of right lower leg with fat layer exposed: Secondary | ICD-10-CM | POA: Diagnosis not present

## 2018-12-08 DIAGNOSIS — E11621 Type 2 diabetes mellitus with foot ulcer: Secondary | ICD-10-CM | POA: Diagnosis not present

## 2018-12-08 DIAGNOSIS — L97512 Non-pressure chronic ulcer of other part of right foot with fat layer exposed: Secondary | ICD-10-CM | POA: Diagnosis not present

## 2019-02-16 ENCOUNTER — Encounter (HOSPITAL_BASED_OUTPATIENT_CLINIC_OR_DEPARTMENT_OTHER): Payer: Medicare Other | Attending: Internal Medicine

## 2019-02-16 ENCOUNTER — Other Ambulatory Visit: Payer: Self-pay

## 2019-02-16 DIAGNOSIS — L97512 Non-pressure chronic ulcer of other part of right foot with fat layer exposed: Secondary | ICD-10-CM | POA: Insufficient documentation

## 2019-02-16 DIAGNOSIS — E11621 Type 2 diabetes mellitus with foot ulcer: Secondary | ICD-10-CM | POA: Insufficient documentation

## 2019-02-16 DIAGNOSIS — L97812 Non-pressure chronic ulcer of other part of right lower leg with fat layer exposed: Secondary | ICD-10-CM | POA: Diagnosis not present

## 2019-02-16 DIAGNOSIS — J449 Chronic obstructive pulmonary disease, unspecified: Secondary | ICD-10-CM | POA: Insufficient documentation

## 2019-02-16 DIAGNOSIS — E11622 Type 2 diabetes mellitus with other skin ulcer: Secondary | ICD-10-CM | POA: Diagnosis not present

## 2019-02-16 DIAGNOSIS — Z9221 Personal history of antineoplastic chemotherapy: Secondary | ICD-10-CM | POA: Diagnosis not present

## 2019-02-16 DIAGNOSIS — E1151 Type 2 diabetes mellitus with diabetic peripheral angiopathy without gangrene: Secondary | ICD-10-CM | POA: Diagnosis not present

## 2019-02-16 DIAGNOSIS — I2510 Atherosclerotic heart disease of native coronary artery without angina pectoris: Secondary | ICD-10-CM | POA: Diagnosis not present

## 2019-02-16 DIAGNOSIS — I10 Essential (primary) hypertension: Secondary | ICD-10-CM | POA: Diagnosis not present

## 2019-03-09 ENCOUNTER — Encounter (HOSPITAL_BASED_OUTPATIENT_CLINIC_OR_DEPARTMENT_OTHER): Payer: Medicare Other | Attending: Internal Medicine

## 2019-03-09 DIAGNOSIS — L97812 Non-pressure chronic ulcer of other part of right lower leg with fat layer exposed: Secondary | ICD-10-CM | POA: Insufficient documentation

## 2019-03-09 DIAGNOSIS — J449 Chronic obstructive pulmonary disease, unspecified: Secondary | ICD-10-CM | POA: Insufficient documentation

## 2019-03-09 DIAGNOSIS — Z9221 Personal history of antineoplastic chemotherapy: Secondary | ICD-10-CM | POA: Diagnosis not present

## 2019-03-09 DIAGNOSIS — S91301A Unspecified open wound, right foot, initial encounter: Secondary | ICD-10-CM | POA: Diagnosis not present

## 2019-03-09 DIAGNOSIS — E11621 Type 2 diabetes mellitus with foot ulcer: Secondary | ICD-10-CM | POA: Insufficient documentation

## 2019-03-09 DIAGNOSIS — I2510 Atherosclerotic heart disease of native coronary artery without angina pectoris: Secondary | ICD-10-CM | POA: Diagnosis not present

## 2019-03-09 DIAGNOSIS — L97512 Non-pressure chronic ulcer of other part of right foot with fat layer exposed: Secondary | ICD-10-CM | POA: Diagnosis not present

## 2019-03-09 DIAGNOSIS — E11622 Type 2 diabetes mellitus with other skin ulcer: Secondary | ICD-10-CM | POA: Insufficient documentation

## 2019-03-09 DIAGNOSIS — E1151 Type 2 diabetes mellitus with diabetic peripheral angiopathy without gangrene: Secondary | ICD-10-CM | POA: Diagnosis not present

## 2019-03-23 DIAGNOSIS — I10 Essential (primary) hypertension: Secondary | ICD-10-CM | POA: Diagnosis not present

## 2019-03-23 DIAGNOSIS — S91301A Unspecified open wound, right foot, initial encounter: Secondary | ICD-10-CM | POA: Diagnosis not present

## 2019-03-23 DIAGNOSIS — S81801A Unspecified open wound, right lower leg, initial encounter: Secondary | ICD-10-CM | POA: Diagnosis not present

## 2019-03-23 DIAGNOSIS — E11622 Type 2 diabetes mellitus with other skin ulcer: Secondary | ICD-10-CM | POA: Diagnosis not present

## 2019-03-23 DIAGNOSIS — J449 Chronic obstructive pulmonary disease, unspecified: Secondary | ICD-10-CM | POA: Diagnosis not present

## 2019-03-23 DIAGNOSIS — L97812 Non-pressure chronic ulcer of other part of right lower leg with fat layer exposed: Secondary | ICD-10-CM | POA: Diagnosis not present

## 2019-03-23 DIAGNOSIS — E11621 Type 2 diabetes mellitus with foot ulcer: Secondary | ICD-10-CM | POA: Diagnosis not present

## 2019-03-23 DIAGNOSIS — L97512 Non-pressure chronic ulcer of other part of right foot with fat layer exposed: Secondary | ICD-10-CM | POA: Diagnosis not present

## 2019-03-23 DIAGNOSIS — E1151 Type 2 diabetes mellitus with diabetic peripheral angiopathy without gangrene: Secondary | ICD-10-CM | POA: Diagnosis not present

## 2019-03-27 DIAGNOSIS — I509 Heart failure, unspecified: Secondary | ICD-10-CM | POA: Diagnosis not present

## 2019-03-27 DIAGNOSIS — Z0000 Encounter for general adult medical examination without abnormal findings: Secondary | ICD-10-CM | POA: Diagnosis not present

## 2019-03-27 DIAGNOSIS — J449 Chronic obstructive pulmonary disease, unspecified: Secondary | ICD-10-CM | POA: Diagnosis not present

## 2019-03-27 DIAGNOSIS — Z1389 Encounter for screening for other disorder: Secondary | ICD-10-CM | POA: Diagnosis not present

## 2019-03-27 DIAGNOSIS — E039 Hypothyroidism, unspecified: Secondary | ICD-10-CM | POA: Diagnosis not present

## 2019-03-27 DIAGNOSIS — E538 Deficiency of other specified B group vitamins: Secondary | ICD-10-CM | POA: Diagnosis not present

## 2019-03-27 DIAGNOSIS — I8310 Varicose veins of unspecified lower extremity with inflammation: Secondary | ICD-10-CM | POA: Diagnosis not present

## 2019-03-27 DIAGNOSIS — I739 Peripheral vascular disease, unspecified: Secondary | ICD-10-CM | POA: Diagnosis not present

## 2019-03-27 DIAGNOSIS — N183 Chronic kidney disease, stage 3 (moderate): Secondary | ICD-10-CM | POA: Diagnosis not present

## 2019-03-27 DIAGNOSIS — E11319 Type 2 diabetes mellitus with unspecified diabetic retinopathy without macular edema: Secondary | ICD-10-CM | POA: Diagnosis not present

## 2019-03-27 DIAGNOSIS — E1151 Type 2 diabetes mellitus with diabetic peripheral angiopathy without gangrene: Secondary | ICD-10-CM | POA: Diagnosis not present

## 2019-04-13 ENCOUNTER — Encounter (HOSPITAL_BASED_OUTPATIENT_CLINIC_OR_DEPARTMENT_OTHER): Payer: Medicare Other | Attending: Internal Medicine

## 2019-04-13 DIAGNOSIS — Z9221 Personal history of antineoplastic chemotherapy: Secondary | ICD-10-CM | POA: Insufficient documentation

## 2019-04-13 DIAGNOSIS — E1151 Type 2 diabetes mellitus with diabetic peripheral angiopathy without gangrene: Secondary | ICD-10-CM | POA: Diagnosis not present

## 2019-04-13 DIAGNOSIS — I10 Essential (primary) hypertension: Secondary | ICD-10-CM | POA: Insufficient documentation

## 2019-04-13 DIAGNOSIS — I2510 Atherosclerotic heart disease of native coronary artery without angina pectoris: Secondary | ICD-10-CM | POA: Insufficient documentation

## 2019-04-13 DIAGNOSIS — E11622 Type 2 diabetes mellitus with other skin ulcer: Secondary | ICD-10-CM | POA: Insufficient documentation

## 2019-04-13 DIAGNOSIS — L97812 Non-pressure chronic ulcer of other part of right lower leg with fat layer exposed: Secondary | ICD-10-CM | POA: Diagnosis not present

## 2019-04-13 DIAGNOSIS — S91301A Unspecified open wound, right foot, initial encounter: Secondary | ICD-10-CM | POA: Diagnosis not present

## 2019-04-13 DIAGNOSIS — L97512 Non-pressure chronic ulcer of other part of right foot with fat layer exposed: Secondary | ICD-10-CM | POA: Diagnosis not present

## 2019-04-13 DIAGNOSIS — J449 Chronic obstructive pulmonary disease, unspecified: Secondary | ICD-10-CM | POA: Diagnosis not present

## 2019-04-13 DIAGNOSIS — E11621 Type 2 diabetes mellitus with foot ulcer: Secondary | ICD-10-CM | POA: Diagnosis not present

## 2019-04-13 DIAGNOSIS — S81801A Unspecified open wound, right lower leg, initial encounter: Secondary | ICD-10-CM | POA: Diagnosis not present

## 2019-04-18 ENCOUNTER — Other Ambulatory Visit: Payer: Self-pay | Admitting: Cardiovascular Disease

## 2019-05-02 ENCOUNTER — Other Ambulatory Visit (HOSPITAL_COMMUNITY): Payer: Self-pay | Admitting: Internal Medicine

## 2019-05-02 DIAGNOSIS — E13621 Other specified diabetes mellitus with foot ulcer: Secondary | ICD-10-CM

## 2019-05-03 ENCOUNTER — Other Ambulatory Visit: Payer: Self-pay

## 2019-05-03 ENCOUNTER — Ambulatory Visit (HOSPITAL_COMMUNITY)
Admission: RE | Admit: 2019-05-03 | Discharge: 2019-05-03 | Disposition: A | Discharge status: Home / Self Care | Payer: Medicare Other | Source: Ambulatory Visit | Attending: Family | Admitting: Family

## 2019-05-03 DIAGNOSIS — L97509 Non-pressure chronic ulcer of other part of unspecified foot with unspecified severity: Secondary | ICD-10-CM

## 2019-05-03 DIAGNOSIS — E13621 Other specified diabetes mellitus with foot ulcer: Secondary | ICD-10-CM | POA: Diagnosis not present

## 2019-05-04 ENCOUNTER — Encounter (HOSPITAL_BASED_OUTPATIENT_CLINIC_OR_DEPARTMENT_OTHER): Payer: Medicare Other | Attending: Internal Medicine

## 2019-05-04 DIAGNOSIS — I10 Essential (primary) hypertension: Secondary | ICD-10-CM | POA: Diagnosis not present

## 2019-05-04 DIAGNOSIS — Z9221 Personal history of antineoplastic chemotherapy: Secondary | ICD-10-CM | POA: Insufficient documentation

## 2019-05-04 DIAGNOSIS — E11621 Type 2 diabetes mellitus with foot ulcer: Secondary | ICD-10-CM | POA: Insufficient documentation

## 2019-05-04 DIAGNOSIS — I2510 Atherosclerotic heart disease of native coronary artery without angina pectoris: Secondary | ICD-10-CM | POA: Insufficient documentation

## 2019-05-04 DIAGNOSIS — E11622 Type 2 diabetes mellitus with other skin ulcer: Secondary | ICD-10-CM | POA: Insufficient documentation

## 2019-05-04 DIAGNOSIS — L97512 Non-pressure chronic ulcer of other part of right foot with fat layer exposed: Secondary | ICD-10-CM | POA: Insufficient documentation

## 2019-05-04 DIAGNOSIS — J449 Chronic obstructive pulmonary disease, unspecified: Secondary | ICD-10-CM | POA: Diagnosis not present

## 2019-05-04 DIAGNOSIS — L97812 Non-pressure chronic ulcer of other part of right lower leg with fat layer exposed: Secondary | ICD-10-CM | POA: Insufficient documentation

## 2019-05-04 DIAGNOSIS — E1151 Type 2 diabetes mellitus with diabetic peripheral angiopathy without gangrene: Secondary | ICD-10-CM | POA: Insufficient documentation

## 2019-05-17 DIAGNOSIS — Z961 Presence of intraocular lens: Secondary | ICD-10-CM | POA: Diagnosis not present

## 2019-05-17 DIAGNOSIS — Z7984 Long term (current) use of oral hypoglycemic drugs: Secondary | ICD-10-CM | POA: Diagnosis not present

## 2019-05-17 DIAGNOSIS — H02409 Unspecified ptosis of unspecified eyelid: Secondary | ICD-10-CM | POA: Diagnosis not present

## 2019-05-17 DIAGNOSIS — H5203 Hypermetropia, bilateral: Secondary | ICD-10-CM | POA: Diagnosis not present

## 2019-05-17 DIAGNOSIS — E119 Type 2 diabetes mellitus without complications: Secondary | ICD-10-CM | POA: Diagnosis not present

## 2019-05-17 DIAGNOSIS — H524 Presbyopia: Secondary | ICD-10-CM | POA: Diagnosis not present

## 2019-05-17 DIAGNOSIS — H52223 Regular astigmatism, bilateral: Secondary | ICD-10-CM | POA: Diagnosis not present

## 2019-05-17 DIAGNOSIS — H18419 Arcus senilis, unspecified eye: Secondary | ICD-10-CM | POA: Diagnosis not present

## 2019-05-25 ENCOUNTER — Encounter (HOSPITAL_BASED_OUTPATIENT_CLINIC_OR_DEPARTMENT_OTHER): Payer: Medicare Other | Admitting: Internal Medicine

## 2019-06-01 ENCOUNTER — Other Ambulatory Visit: Payer: Self-pay

## 2019-06-01 ENCOUNTER — Encounter (HOSPITAL_BASED_OUTPATIENT_CLINIC_OR_DEPARTMENT_OTHER): Payer: Medicare Other | Attending: Internal Medicine | Admitting: Internal Medicine

## 2019-06-01 DIAGNOSIS — L97812 Non-pressure chronic ulcer of other part of right lower leg with fat layer exposed: Secondary | ICD-10-CM | POA: Diagnosis not present

## 2019-06-01 DIAGNOSIS — L97512 Non-pressure chronic ulcer of other part of right foot with fat layer exposed: Secondary | ICD-10-CM | POA: Insufficient documentation

## 2019-06-01 DIAGNOSIS — E1151 Type 2 diabetes mellitus with diabetic peripheral angiopathy without gangrene: Secondary | ICD-10-CM | POA: Insufficient documentation

## 2019-06-01 DIAGNOSIS — E11622 Type 2 diabetes mellitus with other skin ulcer: Secondary | ICD-10-CM | POA: Diagnosis not present

## 2019-06-01 DIAGNOSIS — E11621 Type 2 diabetes mellitus with foot ulcer: Secondary | ICD-10-CM | POA: Diagnosis not present

## 2019-06-01 NOTE — Progress Notes (Signed)
Reginald Ayers ([id number]) Visit Report for 06/01/2019 HPI Details Patient Name: Date of Service: Reginald Ayers 06/01/2019 2:45 PM Medical Record [id number] Patient Account Number: [id number] Date of Birth/Sex: Treating RN: 09-Aug-1951 (68 y.o. Reginald Ayers Primary Care Provider: Katy Ayers Other Clinician: Referring Provider: Treating Provider/Extender:Reginald Ayers, Reginald Ayers, Reginald Ayers in Treatment: 168 History of Present Illness HPI Description: 03/06/16; this is a patient who is a type II diabetic with a recent hemoglobin A1c of 7.6. He is referred from Reginald Ayers for aggressive wound care on a wound on his right anterior leg. He has known severe PAD and has an extensive interventional history. His left common iliac artery was stented in December 2003 and his right superficial femoral artery in April 2008. He has significant infrapopliteal disease bilaterally and has known occluded left SFA. His ABIs from September 2016 showed a ABI of 0.48 on the right and 0.52 on the left although he does not really describe claudication. He also has known carotid artery disease, coronary artery disease and is status post CABG in 2010. He tells me he smokes 3 cigarettes a day. The patient tells me his at the wound on the right anterior leg for about 6 months. He has a smaller area on the left medial leg that has been present for one month. He's been to see his primary physician Reginald Ayers and Reginald Ayers I am not clear what he is been using on this. It is felt that his ulcer is an ischemic ulcer on the pretibial region. It is noted that if we are unable to heal this it was felt he would need repeat angiography and potential percutaneous revascularization. 03/13/16; the area on the left medial leg is healed over. His ischemic wound on the right anterior leg is debridement of a lot of nonviable subcutaneous tissue we've been using Santyl 03/20/16; he has no open area on the left medial  leg. The wound on the right anterior leg looks ischemic. A lot of nonviable tissue. Our intake nurse reported that he had a lot of dried blood clots around the wound presumably from last week's debridement. There is no evidence of infection 04/16/16; the area on the right anterior leg is an ischemic wound. He is been back to see Reginald Ayers who states he is not a candidate for revascularization. He has a large area of occlusion from his groin down which cannot be revascularized. Since Reginald Ayers is aggressive with this type of situation I can only surmised that that's the correct decision 05/01/16 the area on the right leg is an ischemic wound. He has not a candidate for revascularization. He has not smoked in 48 hours. Has been using Santyl wound size is not any smaller however this surface looks somewhat better 05/08/16; ischemic wound on the right anterior leg. He is not a candidate for revascularization. He continues with nonsmoking for the last week. I use Prisma last week although that probably wasn't a good decision, covered in a difficult surface eschar today requiring debridement 05/15/16; patient with ischemic wound on his right anterior leg. He is not a candidate for revascularization. Complains of a lot of pain this week/burning over the wound area making it difficult for him to sleep 05/21/16; patient states less pain this week. The wound had circumferential fibrinous slough that I removed along with some nonviable subcutaneous tissue. We used Hydrofera Blue last week 05/28/16; patient states he has not had any pain. Some minor reduction in the area.  Still using Hydrofera Blue 06/04/16; no pain still using Hydrofera Blue 06/11/16 no complaints still using Hydrofera Blue 06/18/16; I changed him to Endoform last week. 06/25/16; wound still appears healthy. Minor debridement this week still using Endoform 07/02/16; the wound bed certainly appears healthier. Rim of epithelialization superiorly was  there last week. It certainly has not progressed any further. Using Endoform 07/09/16 patient is here for a review of the wound on the right anterior leg. He has a history of severe and prolonged PAD followed by Reginald Ayers in the setting of type 2 diabetes. I have been using Endoform for the last 3 or 4 weeks. He has a rim of epithelialization superiorly but I have not been able to advance this. He complains of pain but there is no clear infection 07/15/2016 -- the patient was here for a nurse visit but due to the presence of a new wound I was asked to see him for an opinion 07/23/16; the patient was seen last week by Reginald Ayers in my absence. He had a new wound on his right dorsal ankle. Santyl was applied to this new area and also his original wound on the right anterior tibia. He arrives today complaining of a lot of pain 07/30/16; the patient has new wound on his right dorsal ankle from 2 weeks ago. This is as usual covered with a thick eschar. I think this is ischemic and I'm not expecting to be able to heal this. Santyl was applied for but denied by his insurance we will therefore use Medihoney. He only obtained this later today. He has left a wrap on all week with Santyl. 08/06/16 both wounds in the same situation still a gelatinous surface slough. He finds the Medihoney makes him feel pain. He could not afford this Santyl opiate 08/20/16; patient arrives today frustrated about his nonhealing wounds now 2 on his right leg. He arrived here with a wound on the right anterior leg mid aspect and is developed a second area on the right dorsal ankle. I have reviewed Reginald Ayers notes and his visit from August/17. There he makes clear reference to the fact that the vessels were not revascularizable. The patient also describes fairly clear claudication at rest which makes him sleep with his legs in a dependent position. He has given up smoking as of yesterday 09/03/16; patient is going for a second  opinion on his vascular status of vein and vascular next week. We have been using Medihoney to the wounds. He is tolerating his discomfort. 09/17/16; the patient is going to have an angiogram on January 30. Miraculously I can't really see has had a recent angiogram although he has been to see Reginald Ayers who does not think here is cerebrovascular rise visible option. He does have stents in both legs that Reginald Ayers put and I think about 9 years ago. I would like to continue to use Medihoney until we see the results of his angiogram. 10/01/16; the patient had his angiogram. He has stent previously which was dilated and then a stent was placed in the right superficial femoral artery. He had failed balloon angioplasty of the right posterior tibial artery. A further stent was placed in the superficial femoral artery. An angioplasty was also done. He was noted to have severe stenosis versus occlusion of the posterior tibial artery which is the dominant vessel across the foot. Intervention was attempted here but was unsuccessful. If the patient is unable to heal his wounds it was felt he  could be brought back for repeat attempt 10/15/16- patient is here for follow-up evaluation of his right lower extremity and right dorsal foot ulcers. He was unable to make last week's appointment secondary to a death in the family. He has been using Iodosorb/Iodoflex dressing changes per home health. He states that his primary care physician follows an A1c every 3 months, with his last A1c being 7.8 which is elevated from the previous. He states he has a scheduled A1c for next month. He also states he has a follow-up appointment with Vein and Vascular in April 10/22/16- Mr. Hymes did have an xray of the right foot, revealing no definite evidence of bone destruction to suggest osteomyelitis. This was performed at [facility]. He has been wearing his show with a rolled tongue since the last visit when he leaves the home,  otherwise he wears loose house slippers around the house. He is voicing no complaints or concerns since his last visit. He admits to smoking "maybe one a week" 10/29/16- patient is here for follow-up evaluation of his right lower extremity and right dorsal foot ulcers. He states that he has completely stopped smoking but does admit to dipping tobacco. We have not found out his coverage for both [employer] and Apligraf. He continues to offload this area with his walking shoe otherwise wears a slipper at home. He brought this (and it does appear to allow for relief of pressure to the dorsal part of his foot. 11/05/16; patient is here for follow-up of his right lower extremity and right dorsal foot ulcers. He has been revascularized. He no longer smokes. We have been using Prisma. 11/12/16- Mr. Fulco arrives for follow up evaluation of his right lower extremity ulcers. We have been using Prisma. He is planned for an Apligraf application today. 11/19/16- Mr.Klug arrives for follow-up evaluation of his right lower extremity and right dorsal foot ulcer one week after Apligraf #1 application. He states the home health came out on Monday and change the foam, Kerlix and Coban and and they did not interrupt the Adaptic touch. He has a follow-up appointment with ultrasound on Monday with vein and vascular 11/26/16- he is here for follow-up evaluation. He is s/p Apligraf #1 application 2 weeks ago. he had an appointment with vascular on Monday for arterial ultrasound. In evaluation of the stent to the right SFA which indicates biphasic waveform proximal to the stent and at the origin of the stent otherwise monophasic flow throughout the stent and at the distal aspect of the stent. An ABI done in that office on Monday was 0.73 (previously 0.47). He has biphasic flow in the right posterior tibial and monophasic flow in the dorsalis pedis. He has a follow-up with Dr. Myra Gianotti on Monday, 4/9 12/03/16- He is here for  follow-up evaluation. He did see Dr. Myra Gianotti on Monday and has a scheduled aortogram with lower extremity arteriogram on the 17th. He was not compressed over the last week. His wound culture came back Oxacillin sensitive staph aureus. He was prescribed Keflex and has been taking that without any voiced complaints. He states that he started taking his Lasix, although he did not contact or have an appointment with Dr. Nehemiah Settle. He does have an appointment in May 12/10/16- he is here for follow-up evaluation. He had an intervention on 4/17 with Dr. Myra Gianotti. According to his dictation 1) the patient has a widely patent right SFA and popliteal stent 2) after the an 80% occlusion of the right PTA was treated with  atherectomy and balloon angioplasty with a residual stenosis of less than 5%. The patient is voicing significant improvement and overall feeling; states that he can now sleep in the bed without any symptoms. His completed his Keflex with no adverse reaction. He continues to self-regulate his Lasix, he is not on any potassium supplementation. 12/17/16; Arrives today with weeping edema thru the dorsal foot wound and lower leg. He has not been using his Lasix and did not have compression on his leg. According to the patient he has 40mg  Lasix at home which he takes prn but not in weeks 12/21/16; less edema and no weeping edema. Apligraf #1 12/31/16; both wounds look somewhat better especially the area distally. Apligraf #2 01/14/17; Apligraf #3. Wounds looked improved. Patient saw main and vascular. ABIs on 01/11/17 done in their office showed a patent SFA stent status post atherectomy with angioplasty of the right PTA [12/08/16] biphasic flow on the right 0.95 monophasic flow on the left 0.45. 01/28/17; Apligraf #4. Wounds not particularly better than I remember. 02/11/17; Apligraf #5 the wounds have some improvement. The distal 1 has had some epithelialization proximal one is somewhat smaller. 02/25/17; he  arrived in clinic today with a disappointing amount of surface necrotic material. This is especially true on the distal anterior foot/ankle wound. Change primary dressing the Hydrofera Blue today 03/04/17; some improvement in the proximal wound on the tibia. The distal wound on the dorsal foot/ankle has too much necrotic surface. We have been using Hydrofera Blue although there was apparently some problem with obtaining it through his home health company however the patient reports he now has it 03/11/17; wounds measure slightly smaller. Surface looks questionable although with the improvement in dimensions will likely continue with the [facility] without debridement today 03/18/17; no major change in these wounds. Questionable surface. Debridement today. Using [facility] 03/25/17; the patient has follow-up noninvasive vascular tests with vein and vascular later this month. The wounds himself are not measuring any different however the proximal wound bed looks better with the Iodoflex not much change with the area over the dorsal foot. 04/22/17; patient had repeat angiography on 04/20/17. He had a drug-coated balloon angioplasty of the right superficial femoral artery balloon angioplasty right common femoral artery. He had stenosis of previously placed stents within the right superficial femoral artery this was treated with drug coated balloon angioplasty. He continues to have single vessel runoff via the peroneal artery He has been using Hydrofera Blue 04/29/17; no major change this week. Wound beds appear healthy. No debridement. He apparently did not tolerate Iodoflex which I was using prior to modification. I'm going to change him to Endo form with hydrogel 05/13/17; wound beds again appear a lot better, paradoxically the wound on the dorsal foot appears to be progressing better than the proximal wound. Using Endoform with hydrogel 06/03/17; doing well. wounds smaller especially the wound on  the dorsal foot. using endoform 06/17/17; two-week follow-up everything is going really nicely here. Using Endoform he only has one small open area left on the right dorsal foot/ankle. Also improved on the right anterior leg. He is retired from smoking since starting with 06/19/17 in this clinic 07/01/17; two-week follow-up the area on his right anterior foot/ankle is just about closed. The area superiorly on the lower mid calf as adherent necrotic debris which will need debridement but it is also down in terms of dimensions. We'll use Endoform 07/19/17 on evaluation today patient appears to be doing well with his Current wound  care measures and there does not appear to be necrotic tissue which is good news. The Endoform however does seem to be causing some epithelialization although this is loose at the proximal/12 o'clock location and seems to be pulling off with dressing changes. No fevers, chills, nausea, or vomiting noted at this time. He has no significant discomfort. 08/05/17; the patient has 2 small open areas on the proximal foot and a larger wound on the right anterior leg. Change to [facility] Blue last time probably because of the hyper granulation proximally 08/26/17; the patient has 2 small open areas on the proximal foot wound which is closed. The other is covered with a thin surface eschar. There is a similar wound on the right anterior leg. We've been using Hydrofera Blue 09/09/17*; the patient went for angiogram with Dr. Trula Slade. Apparently a routine scheduled arterial duplex suggested occlusion of his stent however with the angiography the stent was widely patent. No procedure was necessary. He also apparently has had a long-standing pressure area on the left buttock which dates back to prior to Thanksgiving although he's never really said anything to anybody about this. He is been attempting to treated himself with triple antibiotic cream etc. We've been using silver collagen to the  wounds on his right leg and dorsal right foot. These are largely unchanged today 09/23/17; patient's wounds are smaller in both areas and are just about closed. We have been using silver collagen he shows me an area on his buttock that is in the gluteal fold. This would be an atypical place for a pressure sore like a herpetic lesion although he says it's been there for a long time 2/14/19the patient's wound is totally closed. The area on the dorsal foot is also close to closing. I looked again at the area on his left buttock in the gluteal fold. He tells me that this is been there for 6 months. This more looks more like a pressure ulcer to me than it did when I looked at this 2 weeks ago. I simply put topical antibiotics on this but we are going to have to change to a more formal dressing 10/13/17 on evaluation today patient appears to be doing very well in regard to his right dorsal foot wound. He has been tolerating the dressing changes without complication. Fortunately there does not appear to be any evidence of infection which is good news. He continues to have a left gluteal ulcer as well as he has been tolerating the Prisma dressing on although this is not significantly smaller it does sound like he's been putting other things on this as well including some kind of frame he in fact this order another cream as well although I'm not exactly sure what this is. It's something that he "researched on the Internet that is supposed to make My wound healed him seven days." At worst his pain is a 2/10 in regard to the left gluteal region but this is mainly with cleansing of the wound. 10/21/17; his right dorsal foot wound is just about closed. The area on the right anterior shin area is closed. He has a left gluteal ulcer which is probably a pressure ulcer. He is been using his own creams that he spot on the Internet. This wound is also better. 11/04/17; this is a patient with severe PAD who is been  revascularized by vein and vascular. He had 2 difficult wound areas one on the right anterior shin and the right anterior dorsal foot. More  recently he has developed a left gluteal ulcer which is probably a pressure ulcer although it has some atypical nature to it. His proximal right leg has remained healed. He has a small open area on the right dorsal foot with a nonviable surface 11/18/17; this is a patient with severe PAD who is been revascularized by vein and vascular. The area on the right proximal leg has healed over. He has a very small ischemic wound on the right dorsal foot which was the second wound. He also had a pressure ulcer on his buttock which is healed out as of today 12/02/17; wound on the dorsal foot which I think is largely ischemic. He is upset with me for doing a surface debridement on this last visit however he still has a nonviable surface on this and I think this is largely a blood flow issue 12/16/17; wound on the dorsal foot which I think is largely ischemic. He sees Dr. [place] Sink ham again next week. We've been using Iodoflex to this not much of a healthy surface. 12/30/17; the patient went back to see vascular surgery. He had noninvasive testing. His ABI on the right was 0.70 compared to 0.60 last time on the left 0.57 compared to 0.44. TBI's were 0.3 on the right 0.29 on the left essentially unchanged from last time. Waveforms were monophasic. He was seen by a provider. Noted nonpalpable pulses. Overall they felt that they had slightly improved bilateral ABIs and right TBI. Her disease bilaterally with all waveforms monophasic. They will follow his lower extremity arterial duplex in 6 months. They're calling the areas on his feet venous stasis ulcers. He does not clearly describe claudication 01/13/18; The patient's wound is slightly larger in width and nonviable surface. We've been using Iodoflex. He has home help changing the dressings. I have been reluctant to do  debridement on this since he blames a debridement for reopening his wound actually still think this is arterial even given recent arterial results and evaluation [see above) 01/27/18; using iodoflex. better surface. 02/10/18; using Iodoflex. Surface is some better. Changed him to silver collagen today 03/02/18 on evaluation today patient continues to show signs of slow but good progress in regard to his left lower extremity ulcer. He has been tolerating the dressing changes without complication which is good news. He does have somewhat limited/poor vascular flow but nonetheless he does seem to be healing just at a much slower place and what would be otherwise recommended and expected. I do believe the silver collagen dressing change has been of benefit. 03/22/18; slow but good progress in terms of his right anterior foot wound. These are largely ischemic wounds but he has been revascularized. He is continued with silver collagen 04/07/18 on evaluation today patient appears to be doing rather well in regard to the wound on the dorsal surface of his right foot. He's been tolerating the dressing changes without complication. Fortunately there does not appear to be the evidence of infection at this time which is great news. Overall I'm very pleased with how things seem to appear at this point. 04/21/18; dorsal right foot wound secondary to PAD. Wound is making progress by 2 mm. Surface of it is passable. He is been using silver collagen 05/05/18; we received a call from home health this week that the original proximal tibial area had reopened on the right. He now has 2 open areas including the one we've been watching for a long period on the foot. The new open area seems  to be right in the same place as the original wound previously was located 05/13/2018; wounds are both actually larger. I debrided these last week both wounds are bigger. The surface looks better I been using Iodoflex. 05/27/2018; the wounds  are larger but the surface of them looks better with Iodoflex. He has a follow-up with Dr. Trula Slade sometime in November. I tried to move these up we were not able to get an earlier appointment I will try to text him. 06/16/2018; the patient's wounds are larger but with a generally cleaner surface. He has extensive set of investigations with Dr. Trula Slade on November 5. This includes ABIs TBI's and I believe an ultrasound of the stent. 07/04/2018; the patient apparently stated he felt too dizzy to go for his arterial studies on 11/5 therefore he canceled him. He has not been able to rebook them. I suspect these are ischemic wounds and I told him this. 09/01/2018; he is not been here in almost 2 months. He has been getting his dressing changed by [facility] I believe silver collagen. They are discharging him next week due to nonimprovement of the wounds predominantly. He did not follow-up with vascular surgery he was supposed to have follow-up arterial studies noninvasive. I am not sure that they have been rebooked and I do not think he has been seen. 1/23; patient's wounds are slightly larger. I believe this is ischemic. He canceled his last follow-up appointment with Dr. Trula Slade him that included noninvasive arterial studies. He says this was because of vertigo. He has not rescheduled these. He also has lost home health because of the chronicity of his wounds 2/6; the patient's wound on his right anterior tibia actually looks quite good. Not much difference in size but the surface of the wound looks good. He has a small area on the dorsal foot debris on the surface of this which I debrided with Anasept and gauze. He has not made a follow-up with vein and vascular 2/20; the patient's wound on his right anterior tibia continues to look good although it is not coming down in size much. The area on his right dorsal foot is about the same. Tightly adherent debris. The wound surface does not look healthy  care. The patient is expressed his to sustain for mechanical debridement in the past, I therefore been avoiding this at least until he goes back for repeat vascular appointment. So far he has not done this he is mingling for ENT appointments related to vertigo 3/5; right anterior tibia continues to look satisfactory perhaps slightly smaller. The area on the right dorsal foot is smaller. We have been using Hydrofera Blue. 4/16; the patient wounds have actually done well in the hiatus since we have last seen him. He is using Hydrofera Blue ready 6/25; we have not seen this patient in over 2 months as he has been in the isolation phase for the Covid epidemic. He is using Hydrofera Blue ready. He has 2 wounds right on the right anterior mid tibia and one on the right dorsal foot. Both of these are somewhat improved today. Especially the dorsal 1 over the tibia. He has not been for follow- up arterial studies but I have got him to agree to allow [place] to set these up. I 7/16; the patient's wound proximally in the mid tibia area has closed over once again. We had this closed over before and he reopened the area on the dorsal foot appears better. He has been using Hydrofera Blue. We  do not have an appointment for the follow-up arterial studies I ordered nor do I think he is really all that interested in getting them done. 7/30; patient has been using Hydrofera Blue. The dorsal foot wound looks some better however he has had a reopening in the mid tibia area. This is been closed over the last time. The opening is very superficial. We have been trying to get him follow-up vascular studies done with vein and vascular for about a year. He refused to do these at one point because he was at work getting worked up for vertigo. He is now more ready to move forward with this. 8/20; patient is using Hydrofera Blue. He has not made an appointment for his follow-up noninvasive vascular studies and I have talked to him  about this again. Both wound areas are still open on the right mid tibia and the right dorsal foot although I think they are smaller 05/04/19-Patient is back after his vascular visit his studies revealed that he is got ABI of 0.6 on the right, 0.46 on the left, monophasic flow to both right and left anterior tibial arteries is got great pressures to the right great toe. 10/8; we still do not have a vascular surgery appointment 1 month later. I think he may require a repeat angiogram his wounds on the right anterior to mid tibia is quite a bit larger the area on the right dorsal ankle, is small. Very little open area here Electronic Signature(s) Signed: 06/01/2019 5:49:31 PM By: Baltazar Najjar MD Entered By: Baltazar Najjar on 06/01/2019 15:39:36 -------------------------------------------------------------------------------- Physical Exam Details Patient Name: Date of Service: Reginald Ayers 06/01/2019 2:45 PM Medical Record [id number] Patient Account Number: [id number] Date of Birth/Sex: Treating RN: 12/07/1950 (68 y.o. Reginald Ayers Primary Care Provider: Katy Ayers Other Clinician: Referring Provider: Treating Provider/Extender:Weylyn Ricciuti, Reginald Ayers, Reginald Ayers in Treatment: 168 Constitutional Patient is hypertensive.. Pulse regular and within target range for patient.Marland Kitchen Respirations regular, non-labored and within target range.. Temperature is normal and within the target range for the patient.Marland Kitchen Appears in no distress. Respiratory work of breathing is normal. Cardiovascular Pedal pulses absent bilaterally.Marland Kitchen Psychiatric appears at normal baseline. Notes Wound exam; right lower leg anterior tibial wound is much larger. The distal foot wound in the crease of ankle looks as though it has some sort of surface although I do not think it is completely closed Electronic Signature(s) Signed: 06/01/2019 5:49:31 PM By: Baltazar Najjar MD Entered By: Baltazar Najjar on  06/01/2019 15:44:51 -------------------------------------------------------------------------------- Physician Orders Details Patient Name: Date of Service: Reginald Ayers 06/01/2019 2:45 PM Medical Record [id number] Patient Account Number: [id number] Date of Birth/Sex: Treating RN: [birth year] (68 y.o. Reginald Ayers Primary Care Provider: Katy Ayers Other Clinician: Referring Provider: Treating Provider/Extender:Brentney Goldbach, Reginald Ayers, Reginald Ayers in Treatment: [phone] Verbal / Phone Orders: No Diagnosis Coding Follow-up Appointments Return appointment in 3 weeks. - Patient to call wound center next week if [facility]in [facility] V[facility] does not schedule an appointment to see vascular surgeon. Dressing Change Frequency Other: - twice a week Skin Barriers/Peri-Wound Care Moisturizing lotion - to leg Wound Cleansing May shower with protection. Primary Wound Dressing Wound #3 Right,Dorsal Foot Hydrofera Blue Wound #5R Right,Anterior Lower Leg Hydrofera Blue Secondary Dressing Wound #3 Right,Dorsal Foot Foam Border Wound #5R Right,Anterior Lower Leg Foam Border Edema Control Avoid standing for long periods of time Elevate legs to the level of the heart or above for 30 minutes daily and/or when sitting, a frequency of: -  throughout the day Other: - patient to purchase support stockings for protection. Off-Loading Turn and reposition every 2 hours Electronic Signature(s) Signed: 06/01/2019 5:49:31 PM By: Baltazar Najjar MD Signed: 06/01/2019 6:06:48 PM By: Shawn Stall Entered By: Shawn Stall on 06/01/2019 15:29:36 -------------------------------------------------------------------------------- Problem List Details Patient Name: Date of Service: Reginald Ayers 06/01/2019 2:45 PM Medical Record [id number] Patient Account Number: [id number] Date of Birth/Sex: Treating RN: Dec 08, 1950 (68 y.o. 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/03/2018 No Yes with fat layer exposed Inactive Problems Resolved Problems ICD-10 Code Description Active Date Resolved Date L97.213 Non-pressure chronic ulcer of right calf with necrosis of 03/06/2016 03/06/2016 muscle L97.221 Non-pressure chronic ulcer of left calf limited to breakdown of 03/06/2016 03/06/2016 skin L97.312 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 Reginald Ayers Primary Care Provider: Katy Ayers Other Clinician: Referring Provider: Treating Provider/Extender:Heavenlee Maiorana, Reginald Ayers, Reginald Ayers in Treatment: 168 Subjective History of Present Illness (HPI) 03/06/16; this is a patient who is a type II diabetic with a recent hemoglobin A1c of 7.6. He is referred from Reginald Ayers for aggressive wound care on a wound on his right anterior leg. He has known severe PAD and has an extensive interventional history. His left common iliac artery  was stented in December 2003 and his right superficial femoral artery in April 2008. He has significant infrapopliteal disease bilaterally and has known occluded left SFA. His ABIs from September 2016 showed a ABI of 0.48 on the right and 0.52 on the left although he does not really describe claudication. He also has known carotid artery disease, coronary artery disease and is status post CABG in 2010. He tells me he smokes 3 cigarettes a day. The patient tells me his at the wound on the right anterior leg for about 6 months. He has a smaller area on the left medial leg that has been present for one month. He's been to see his primary physician Reginald Ayers and Reginald Ayers I am not clear what he is been using on this. It is felt that his ulcer is an ischemic ulcer on the pretibial region. It is noted that if we are unable to heal this it was felt he would need repeat angiography and potential percutaneous revascularization. 03/13/16; the area on the left medial leg is healed over. His ischemic wound on the right anterior leg is debridement of a lot of nonviable subcutaneous tissue we've been using Santyl 03/20/16; he has no open area on the left medial leg. The wound on the right anterior leg looks ischemic. A lot of nonviable tissue. Our intake nurse reported that he had a lot of dried blood clots around the wound presumably from last week's  debridement. There is no evidence of infection 04/16/16; the area on the right anterior leg is an ischemic wound. He is been back to see Reginald Ayers who states he is not a candidate for revascularization. He has a large area of occlusion from his groin down which cannot be revascularized. Since Reginald Ayers is aggressive with this type of situation I can only surmised that that's the correct decision 05/01/16 the area on the right leg is an ischemic wound. He has not a candidate for revascularization. He has not smoked in 48 hours. Has been using Santyl wound size is not  any smaller however this surface looks somewhat better 05/08/16; ischemic wound on the right anterior leg. He is not a candidate for revascularization. He continues with nonsmoking for the last week. I use Prisma last week although that probably wasn't a good decision, covered in a difficult surface eschar today requiring debridement 05/15/16; patient with ischemic wound on his right anterior leg. He is not a candidate for revascularization. Complains of a lot of pain this week/burning over the wound area making it difficult for him to sleep 05/21/16; patient states less pain this week. The wound had circumferential fibrinous slough that I removed along with some nonviable subcutaneous tissue. We used Hydrofera Blue last week 05/28/16; patient states he has not had any pain. Some minor reduction in the area. Still using Hydrofera Blue 06/04/16; no pain still using Hydrofera Blue 06/11/16 no complaints still using Hydrofera Blue 06/18/16; I changed him to Endoform last week. 06/25/16; wound still appears healthy. Minor debridement this week still using Endoform 07/02/16; the wound bed certainly appears healthier. Rim of epithelialization superiorly was there last week. It certainly has not progressed any further. Using Endoform 07/09/16 patient is here for a review of the wound on the right anterior leg. He has a history of severe and prolonged PAD followed by Reginald Ayers in the setting of type 2 diabetes. I have been using Endoform for the last 3 or 4 weeks. He has a rim of epithelialization superiorly but I have not been able to advance this. He complains of pain but there is no clear infection 07/15/2016 -- the patient was here for a nurse visit but due to the presence of a new wound I was asked to see him for an opinion 07/23/16; the patient was seen last week by Reginald Ayers in my absence. He had a new wound on his right dorsal ankle. Santyl was applied to this new area and also his original wound  on the right anterior tibia. He arrives today complaining of a lot of pain 07/30/16; the patient has new wound on his right dorsal ankle from 2 weeks ago. This is as usual covered with a thick eschar. I think this is ischemic and I'm not expecting to be able to heal this. Santyl was applied for but denied by his insurance we will therefore use Medihoney. He only obtained this later today. He has left a wrap on all week with Santyl. 08/06/16 both wounds in the same situation still a gelatinous surface slough. He finds the Medihoney makes him feel pain. He could not afford this Santyl opiate 08/20/16; patient arrives today frustrated about his nonhealing wounds now o2 on his right leg. He arrived here with a wound on the right anterior leg mid aspect and is developed a second area on the right dorsal ankle. I have reviewed Reginald Ayers notes and his visit from August/17. There he makes clear reference to  the fact that the vessels were not revascularizable. The patient also describes fairly clear claudication at rest which makes him sleep with his legs in a dependent position. He has given up smoking as of yesterday 09/03/16; patient is going for a second opinion on his vascular status of vein and vascular next week. We have been using Medihoney to the wounds. He is tolerating his discomfort. 09/17/16; the patient is going to have an angiogram on January 30. Miraculously I can't really see has had a recent angiogram although he has been to see Reginald Ayers who does not think here is cerebrovascular rise visible option. He does have stents in both legs that Reginald Ayers put and I think about 9 years ago. I would like to continue to use Medihoney until we see the results of his angiogram. 10/01/16; the patient had his angiogram. He has stent previously which was dilated and then a stent was placed in the right superficial femoral artery. He had failed balloon angioplasty of the right posterior tibial artery. A  further stent was placed in the superficial femoral artery. An angioplasty was also done. He was noted to have severe stenosis versus occlusion of the posterior tibial artery which is the dominant vessel across the foot. Intervention was attempted here but was unsuccessful. If the patient is unable to heal his wounds it was felt he could be brought back for repeat attempt 10/15/16- patient is here for follow-up evaluation of his right lower extremity and right dorsal foot ulcers. He was unable to make last week's appointment secondary to a death in the family. He has been using Iodosorb/Iodoflex dressing changes per home health. He states that his primary care physician follows an A1c every 3 months, with his last A1c being 7.8 which is elevated from the previous. He states he has a scheduled A1c for next month. He also states he has a follow-up appointment with Vein and Vascular in April 10/22/16- Mr. Guillet did have an xray of the right foot, revealing no definite evidence of bone destruction to suggest osteomyelitis. This was performed at [facility]. He has been wearing his show with a rolled tongue since the last visit when he leaves the home, otherwise he wears loose house slippers around the house. He is voicing no complaints or concerns since his last visit. He admits to smoking "maybe one a week" 10/29/16- patient is here for follow-up evaluation of his right lower extremity and right dorsal foot ulcers. He states that he has completely stopped smoking but does admit to dipping tobacco. We have not found out his coverage for both [employer] and Apligraf. He continues to offload this area with his walking shoe otherwise wears a slipper at home. He brought this (and it does appear to allow for relief of pressure to the dorsal part of his foot. 11/05/16; patient is here for follow-up of his right lower extremity and right dorsal foot ulcers. He has been revascularized. He no longer smokes. We  have been using Prisma. 11/12/16- Mr. Kuhl arrives for follow up evaluation of his right lower extremity ulcers. We have been using Prisma. He is planned for an Apligraf application today. 11/19/16- Mr.Budney arrives for follow-up evaluation of his right lower extremity and right dorsal foot ulcer one week after Apligraf #1 application. He states the home health came out on Monday and change the foam, Kerlix and Coban and and they did not interrupt the Adaptic touch. He has a follow-up appointment with ultrasound on Monday with  vein and vascular 11/26/16- he is here for follow-up evaluation. He is s/p Apligraf #1 application 2 weeks ago. he had an appointment with vascular on Monday for arterial ultrasound. In evaluation of the stent to the right SFA which indicates biphasic waveform proximal to the stent and at the origin of the stent otherwise monophasic flow throughout the stent and at the distal aspect of the stent. An ABI done in that office on Monday was 0.73 (previously 0.47). He has biphasic flow in the right posterior tibial and monophasic flow in the dorsalis pedis. He has a follow-up with Dr. Trula Slade on Monday, 4/9 12/03/16- He is here for follow-up evaluation. He did see Dr. Trula Slade on Monday and has a scheduled aortogram with lower extremity arteriogram on the 17th. He was not compressed over the last week. His wound culture came back Oxacillin sensitive staph aureus. He was prescribed Keflex and has been taking that without any voiced complaints. He states that he started taking his Lasix, although he did not contact or have an appointment with Dr. Delfina Redwood. He does have an appointment in May 12/10/16- he is here for follow-up evaluation. He had an intervention on 4/17 with Dr. Trula Slade. According to his dictation 1) the patient has a widely patent right SFA and popliteal stent 2) after the an 80% occlusion of the right PTA was treated with atherectomy and balloon angioplasty with a residual  stenosis of less than 5%. The patient is voicing significant improvement and overall feeling; states that he can now sleep in the bed without any symptoms. His completed his Keflex with no adverse reaction. He continues to self-regulate his Lasix, he is not on any potassium supplementation. 12/17/16; Arrives today with weeping edema thru the dorsal foot wound and lower leg. He has not been using his Lasix and did not have compression on his leg. According to the patient he has 40mg  Lasix at home which he takes prn but not in weeks 12/21/16; less edema and no weeping edema. Apligraf #1 12/31/16; both wounds look somewhat better especially the area distally. Apligraf #2 01/14/17; Apligraf #3. Wounds looked improved. Patient saw main and vascular. ABIs on 01/11/17 done in their office showed a patent SFA stent status post atherectomy with angioplasty of the right PTA [12/08/16] biphasic flow on the right 0.95 monophasic flow on the left 0.45. 01/28/17; Apligraf #4. Wounds not particularly better than I remember. 02/11/17; Apligraf #5 the wounds have some improvement. The distal 1 has had some epithelialization proximal one is somewhat smaller. 02/25/17; he arrived in clinic today with a disappointing amount of surface necrotic material. This is especially true on the distal anterior foot/ankle wound. Change primary dressing the Hydrofera Blue today 03/04/17; some improvement in the proximal wound on the tibia. The distal wound on the dorsal foot/ankle has too much necrotic surface. We have been using Hydrofera Blue although there was apparently some problem with obtaining it through his home health company however the patient reports he now has it 03/11/17; wounds measure slightly smaller. Surface looks questionable although with the improvement in dimensions will likely continue with the [facility] without debridement today 03/18/17; no major change in these wounds. Questionable surface. Debridement today.  Using [facility] 03/25/17; the patient has follow-up noninvasive vascular tests with vein and vascular later this month. The wounds himself are not measuring any different however the proximal wound bed looks better with the Iodoflex not much change with the area over the dorsal foot. 04/22/17; patient had repeat angiography  on 04/20/17. He had a drug-coated balloon angioplasty of the right superficial femoral artery balloon angioplasty right common femoral artery. He had stenosis of previously placed stents within the right superficial femoral artery this was treated with drug coated balloon angioplasty. He continues to have single vessel runoff via the peroneal artery He has been using Hydrofera Blue 04/29/17; no major change this week. Wound beds appear healthy. No debridement. He apparently did not tolerate Iodoflex which I was using prior to modification. I'm going to change him to Endo form with hydrogel 05/13/17; wound beds again appear a lot better, paradoxically the wound on the dorsal foot appears to be progressing better than the proximal wound. Using Endoform with hydrogel 06/03/17; doing well. wounds smaller especially the wound on the dorsal foot. using endoform 06/17/17; two-week follow-up everything is going really nicely here. Using Endoform he only has one small open area left on the right dorsal foot/ankle. Also improved on the right anterior leg. He is retired from smoking since starting with [place] in this clinic 07/01/17; two-week follow-up the area on his right anterior foot/ankle is just about closed. The area superiorly on the lower mid calf as adherent necrotic debris which will need debridement but it is also down in terms of dimensions. We'll use Endoform 07/19/17 on evaluation today patient appears to be doing well with his Current wound care measures and there does not appear to be necrotic tissue which is good news. The Endoform however does seem to be causing  some epithelialization although this is loose at the proximal/12 o'clock location and seems to be pulling off with dressing changes. No fevers, chills, nausea, or vomiting noted at this time. He has no significant discomfort. 08/05/17; the patient has 2 small open areas on the proximal foot and a larger wound on the right anterior leg. Change to [facility] Blue last time probably because of the hyper granulation proximally 08/26/17; the patient has 2 small open areas on the proximal foot wound which is closed. The other is covered with a thin surface eschar. There is a similar wound on the right anterior leg. We've been using Hydrofera Blue 09/09/17*; the patient went for angiogram with Dr. Myra Gianotti. Apparently a routine scheduled arterial duplex suggested occlusion of his stent however with the angiography the stent was widely patent. No procedure was necessary. He also apparently has had a long-standing pressure area on the left buttock which dates back to prior to Thanksgiving although he's never really said anything to anybody about this. He is been attempting to treated himself with triple antibiotic cream etc. We've been using silver collagen to the wounds on his right leg and dorsal right foot. These are largely unchanged today 09/23/17; patient's wounds are smaller in both areas and are just about closed. We have been using silver collagen he shows me an area on his buttock that is in the gluteal fold. This would be an atypical place for a pressure sore like a herpetic lesion although he says it's been there for a long time 2/14/19the patient's wound is totally closed. The area on the dorsal foot is also close to closing. I looked again at the area on his left buttock in the gluteal fold. He tells me that this is been there for 6 months. This more looks more like a pressure ulcer to me than it did when I looked at this 2 weeks ago. I simply put topical antibiotics on this but we are going to  have to change to  a more formal dressing 10/13/17 on evaluation today patient appears to be doing very well in regard to his right dorsal foot wound. He has been tolerating the dressing changes without complication. Fortunately there does not appear to be any evidence of infection which is good news. He continues to have a left gluteal ulcer as well as he has been tolerating the Prisma dressing on although this is not significantly smaller it does sound like he's been putting other things on this as well including some kind of frame he in fact this order another cream as well although I'm not exactly sure what this is. It's something that he "researched on the Internet that is supposed to make My wound healed him seven days." At worst his pain is a 2/10 in regard to the left gluteal region but this is mainly with cleansing of the wound. 10/21/17; his right dorsal foot wound is just about closed. The area on the right anterior shin area is closed. He has a left gluteal ulcer which is probably a pressure ulcer. He is been using his own creams that he spot on the Internet. This wound is also better. 11/04/17; this is a patient with severe PAD who is been revascularized by vein and vascular. He had 2 difficult wound areas one on the right anterior shin and the right anterior dorsal foot. More recently he has developed a left gluteal ulcer which is probably a pressure ulcer although it has some atypical nature to it. His proximal right leg has remained healed. He has a small open area on the right dorsal foot with a nonviable surface 11/18/17; this is a patient with severe PAD who is been revascularized by vein and vascular. The area on the right proximal leg has healed over. He has a very small ischemic wound on the right dorsal foot which was the second wound. He also had a pressure ulcer on his buttock which is healed out as of today 12/02/17; wound on the dorsal foot which I think is largely ischemic. He  is upset with me for doing a surface debridement on this last visit however he still has a nonviable surface on this and I think this is largely a blood flow issue 12/16/17; wound on the dorsal foot which I think is largely ischemic. He sees Dr. [place] [facility] ham again next week. We've been using Iodoflex to this not much of a healthy surface. 12/30/17; the patient went back to see vascular surgery. He had noninvasive testing. His ABI on the right was 0.70 compared to 0.60 last time on the left 0.57 compared to 0.44. TBI's were 0.3 on the right 0.29 on the left essentially unchanged from last time. Waveforms were monophasic. He was seen by a provider. Noted nonpalpable pulses. Overall they felt that they had slightly improved bilateral ABIs and right TBI. Her disease bilaterally with all waveforms monophasic. They will follow his lower extremity arterial duplex in 6 months. They're calling the areas on his feet venous stasis ulcers. He does not clearly describe claudication 01/13/18; The patient's wound is slightly larger in width and nonviable surface. We've been using Iodoflex. He has home help changing the dressings. I have been reluctant to do debridement on this since he blames a debridement for reopening his wound actually still think this is arterial even given recent arterial results and evaluation [see above) 01/27/18; using iodoflex. better surface. 02/10/18; using Iodoflex. Surface is some better. Changed him to silver collagen today 03/02/18 on evaluation today patient  continues to show signs of slow but good progress in regard to his left lower extremity ulcer. He has been tolerating the dressing changes without complication which is good news. He does have somewhat limited/poor vascular flow but nonetheless he does seem to be healing just at a much slower place and what would be otherwise recommended and expected. I do believe the silver collagen dressing change has been of benefit. 03/22/18; slow  but good progress in terms of his right anterior foot wound. These are largely ischemic wounds but he has been revascularized. He is continued with silver collagen 04/07/18 on evaluation today patient appears to be doing rather well in regard to the wound on the dorsal surface of his right foot. He's been tolerating the dressing changes without complication. Fortunately there does not appear to be the evidence of infection at this time which is great news. Overall I'm very pleased with how things seem to appear at this point. 04/21/18; dorsal right foot wound secondary to PAD. Wound is making progress by 2 mm. Surface of it is passable. He is been using silver collagen 05/05/18; we received a call from home health this week that the original proximal tibial area had reopened on the right. He now has 2 open areas including the one we've been watching for a long period on the foot. The new open area seems to be right in the same place as the original wound previously was located 05/13/2018; wounds are both actually larger. I debrided these last week both wounds are bigger. The surface looks better I been using Iodoflex. 05/27/2018; the wounds are larger but the surface of them looks better with Iodoflex. He has a follow-up with Dr. Myra Gianotti sometime in November. I tried to move these up we were not able to get an earlier appointment I will try to text him. 06/16/2018; the patient's wounds are larger but with a generally cleaner surface. He has extensive set of investigations with Dr. Myra Gianotti on November 5. This includes ABIs TBI's and I believe an ultrasound of the stent. 07/04/2018; the patient apparently stated he felt too dizzy to go for his arterial studies on 11/5 therefore he canceled him. He has not been able to rebook them. I suspect these are ischemic wounds and I told him this. 09/01/2018; he is not been here in almost 2 months. He has been getting his dressing changed by [facility] I  believe silver collagen. They are discharging him next week due to nonimprovement of the wounds predominantly. He did not follow-up with vascular surgery he was supposed to have follow-up arterial studies noninvasive. I am not sure that they have been rebooked and I do not think he has been seen. 1/23; patient's wounds are slightly larger. I believe this is ischemic. He canceled his last follow-up appointment with Dr. Myra Gianotti him that included noninvasive arterial studies. He says this was because of vertigo. He has not rescheduled these. He also has lost home health because of the chronicity of his wounds 2/6; the patient's wound on his right anterior tibia actually looks quite good. Not much difference in size but the surface of the wound looks good. He has a small area on the dorsal foot debris on the surface of this which I debrided with Anasept and gauze. He has not made a follow-up with vein and vascular 2/20; the patient's wound on his right anterior tibia continues to look good although it is not coming down in size much. The area on his right  dorsal foot is about the same. Tightly adherent debris. The wound surface does not look healthy care. The patient is expressed his to sustain for mechanical debridement in the past, I therefore been avoiding this at least until he goes back for repeat vascular appointment. So far he has not done this he is mingling for ENT appointments related to vertigo 3/5; right anterior tibia continues to look satisfactory perhaps slightly smaller. The area on the right dorsal foot is smaller. We have been using Hydrofera Blue. 4/16; the patient wounds have actually done well in the hiatus since we have last seen him. He is using Hydrofera Blue ready 6/25; we have not seen this patient in over 2 months as he has been in the isolation phase for the Covid epidemic. He is using Hydrofera Blue ready. He has 2 wounds right on the right anterior mid tibia and one on  the right dorsal foot. Both of these are somewhat improved today. Especially the dorsal 1 over the tibia. He has not been for follow- up arterial studies but I have got him to agree to allow [place] to set these up. I 7/16; the patient's wound proximally in the mid tibia area has closed over once again. We had this closed over before and he reopened the area on the dorsal foot appears better. He has been using Hydrofera Blue. We do not have an appointment for the follow-up arterial studies I ordered nor do I think he is really all that interested in getting them done. 7/30; patient has been using Hydrofera Blue. The dorsal foot wound looks some better however he has had a reopening in the mid tibia area. This is been closed over the last time. The opening is very superficial. We have been trying to get him follow-up vascular studies done with vein and vascular for about a year. He refused to do these at one point because he was at work getting worked up for vertigo. He is now more ready to move forward with this. 8/20; patient is using Hydrofera Blue. He has not made an appointment for his follow-up noninvasive vascular studies and I have talked to him about this again. Both wound areas are still open on the right mid tibia and the right dorsal foot although I think they are smaller 05/04/19-Patient is back after his vascular visit his studies revealed that he is got ABI of 0.6 on the right, 0.46 on the left, monophasic flow to both right and left anterior tibial arteries is got great pressures to the right great toe. 10/8; we still do not have a vascular surgery appointment 1 month later. I think he may require a repeat angiogram his wounds on the right anterior to mid tibia is quite a bit larger the area on the right dorsal ankle, is small. Very little open area here Objective Constitutional Patient is hypertensive.. Pulse regular and within target range for patient.Marland Kitchen Respirations regular,  non-labored and within target range.. Temperature is normal and within the target range for the patient.Marland Kitchen Appears in no distress. Vitals Time Taken: 3:02 PM, Height: 61 in, Weight: 122 lbs, BMI: 23, Temperature: 98.3 F, Pulse: 90 bpm, Respiratory Rate: 16 breaths/min, Blood Pressure: 163/87 mmHg. Respiratory work of breathing is normal. Cardiovascular Pedal pulses absent bilaterally.Marland Kitchen Psychiatric appears at normal baseline. General Notes: Wound exam; right lower leg anterior tibial wound is much larger. The distal foot wound in the crease of ankle looks as though it has some sort of surface although I do  not think it is completely closed Integumentary (Hair, Skin) Wound #3 status is Open. Original cause of wound was Blister. The wound is located on the Right,Dorsal Foot. The wound measures 0.8cm length x 0.6cm width x 0.1cm depth; 0.377cm^2 area and 0.038cm^3 volume. There is Fat Layer (Subcutaneous Tissue) Exposed exposed. There is no tunneling or undermining noted. There is a medium amount of serosanguineous drainage noted. The wound margin is flat and intact. There is large (67-100%) pink, pale granulation within the wound bed. There is a small (1-33%) amount of necrotic tissue within the wound bed including Adherent Slough. Wound #5R status is Open. Original cause of wound was Gradually Appeared. The wound is located on the Right,Anterior Lower Leg. The wound measures 2.8cm length x 3cm width x 0.1cm depth; 6.597cm^2 area and 0.66cm^3 volume. There is Fat Layer (Subcutaneous Tissue) Exposed exposed. There is no tunneling or undermining noted. There is a medium amount of serosanguineous drainage noted. The wound margin is flat and intact. There is medium (34-66%) red granulation within the wound bed. There is a medium (34-66%) amount of necrotic tissue within the wound bed including Adherent Slough. Assessment Active Problems ICD-10 Type 2 diabetes mellitus with other skin  ulcer Type 2 diabetes mellitus with foot ulcer Type 2 diabetes mellitus with diabetic peripheral angiopathy without gangrene Non-pressure chronic ulcer of other part of right foot with fat layer exposed Plan Follow-up Appointments: Return appointment in 3 weeks. - Patient to call wound center next week if Vein and Vascular does not schedule an appointment to see vascular surgeon. Dressing Change Frequency: Other: - twice a week Skin Barriers/Peri-Wound Care: Moisturizing lotion - to leg Wound Cleansing: May shower with protection. Primary Wound Dressing: Wound #3 Right,Dorsal Foot: Hydrofera Blue Wound #5R Right,Anterior Lower Leg: Hydrofera Blue Secondary Dressing: Wound #3 Right,Dorsal Foot: Foam Border Wound #5R Right,Anterior Lower Leg: Foam Border Edema Control: Avoid standing for long periods of time Elevate legs to the level of the heart or above for 30 minutes daily and/or when sitting, a frequency of: - throughout the day Other: - patient to purchase support stockings for protection. Off-Loading: Turn and reposition every 2 hours 1. Were going on with Hydrofera Blue 2. I am trying to get this patient to go back to vascular surgery. The big question is does he need a repeat angiogram or not. Electronic Signature(s) Signed: 06/01/2019 5:49:31 PM By: Baltazar Najjar MD Entered By: Baltazar Najjar on 06/01/2019 15:45:26 -------------------------------------------------------------------------------- SuperBill Details Patient Name: Date of Service: Reginald Ayers, Reginald Ayers 06/01/2019 Medical Record [id number] Patient Account Number: [id number] Date of Birth/Sex: Treating RN: March 06, 1951 (68 y.o. Harlon Flor, Millard.Loa Primary Care Provider: Katy Ayers Other Clinician: Referring Provider: Treating Provider/Extender:Loel Betancur, Reginald Ayers, Reginald Ayers in Treatment: 168 Diagnosis Coding ICD-10 Codes Code Description E11.622 Type 2 diabetes mellitus with other skin  ulcer E11.621 Type 2 diabetes mellitus with foot ulcer E11.51 Type 2 diabetes mellitus with diabetic peripheral angiopathy without gangrene L97.512 Non-pressure chronic ulcer of other part of right foot with fat layer exposed Facility Procedures The patient participates with Medicare or their insurance follows the Medicare Facility Guidelines: CPT4 Code Description Modifier Quantity [id number] [phone] - WOUND CARE VISIT-LEV 4 EST PT 1 Physician Procedures CPT4 Code Description: [id number] - WC PHYS LEVEL 3 - EST PT ICD-10 Diagnosis Description L97.512 Non-pressure chronic ulcer of other part of right foo E11.622 Type 2 diabetes mellitus with other skin ulcer E11.51 Type 2 diabetes mellitus with  diabetic peripheral ang Modifier: t with fat  laye iopathy without Quantity: 1 r exposed gangrene Electronic Signature(s) Signed: 06/01/2019 5:49:31 PM By: Baltazar Najjar MD Entered By: Baltazar Najjar on 06/01/2019 15:47:10

## 2019-06-06 NOTE — Progress Notes (Signed)
Reginald Ayers, Reginald H. ([id number]) Visit Report for 06/01/2019 Arrival Information Details Patient Name: Date of Service: Reginald Ayers, Reginald H. 06/01/2019 2:45 PM Medical Record [id number] Patient Account Number: [id number] Date of Birth/Sex: Treating RN: 08-15-1951 (68 y.o. Reginald KollerM) Reginald Ayers Primary Care Mee Macdonnell: Reginald Ayers, Reginald Ayers Other Clinician: Referring Reginald Ayers: Treating Reginald Ayers/Extender:Reginald Ayers, Reginald Ayers: 168 Visit Information History Since Last Visit Added or deleted any medications: No Patient Arrived: Reginald MorCane Any new allergies or adverse reactions: No Arrival Time: 14:59 Had a fall or experienced change in No Accompanied By: alone activities of daily living that may affect Transfer Assistance: None risk of falls: Patient Identification Verified: Yes Signs or symptoms of abuse/neglect since last No Secondary Verification Process Yes visito Completed: Hospitalized since last visit: No Patient Requires Transmission- No Implantable device outside of the clinic excluding No Based Precautions: cellular tissue based products placed in the center Patient Has Alerts: Yes since last visit: Patient Alerts: Patient on Blood Has Dressing in Place as Prescribed: Yes Thinner Pain Present Now: No Electronic Signature(s) Signed: 06/06/2019 5:52:09 PM By: Zandra AbtsLynch, Shatara RN, BSN Entered By: Zandra AbtsLynch, Ayers on 06/01/2019 15:01:03 -------------------------------------------------------------------------------- Clinic Level of Care Assessment Details Patient Name: Date of Service: Reginald Ayers, Reginald H. 06/01/2019 2:45 PM Medical Record [id number] Patient Account Number: [id number] Date of Birth/Sex: Treating RN: 08-15-1951 (68 y.o. Reginald SoursM) Ayers, Reginald Primary Care Laquitta Dominski: Reginald Ayers, Reginald Ayers Other Clinician: Referring Wilfred Siverson: Treating Kenzley Ke/Extender:Reginald Ayers, Reginald Ayers: 168 Clinic Level of Care Assessment Items TOOL 4  Quantity Score X - Use when only an EandM is performed on FOLLOW-UP visit 1 0 ASSESSMENTS - Nursing Assessment / Reassessment X - Reassessment of Co-morbidities (includes updates in patient status) 1 10 X - Reassessment of Adherence to Ayers Plan 1 5 ASSESSMENTS - Wound and Skin Assessment / Reassessment []  - Simple Wound Assessment / Reassessment - one wound 0 X - Complex Wound Assessment / Reassessment - multiple wounds 2 5 X - Dermatologic / Skin Assessment (not related to wound area) 1 10 ASSESSMENTS - Focused Assessment X - Circumferential Edema Measurements - multi extremities 1 5 X - Nutritional Assessment / Counseling / Intervention 1 10 []  - Lower Extremity Assessment (monofilament, tuning fork, pulses) 0 []  - Peripheral Arterial Disease Assessment (using hand held doppler) 0 ASSESSMENTS - Ostomy and/or Continence Assessment and Care []  - Incontinence Assessment and Management 0 []  - Ostomy Care Assessment and Management (repouching, etc.) 0 PROCESS - Coordination of Care []  - Simple Patient / Family Education for ongoing care 0 X - Complex (extensive) Patient / Family Education for ongoing care 1 20 X - Staff obtains ChiropractorConsents, Records, Test Results / Process Orders 1 10 []  - Staff telephones HHA, Nursing Homes / Clarify orders / etc 0 []  - Routine Transfer to another Facility (non-emergent condition) 0 []  - Routine Hospital Admission (non-emergent condition) 0 []  - New Admissions / Manufacturing engineernsurance Authorizations / Ordering NPWT, Apligraf, etc. 0 []  - Emergency Hospital Admission (emergent condition) 0 []  - Simple Discharge Coordination 0 X - Complex (extensive) Discharge Coordination 1 15 PROCESS - Special Needs []  - Pediatric / Minor Patient Management 0 []  - Isolation Patient Management 0 []  - Hearing / Language / Visual special needs 0 []  - Assessment of Community assistance (transportation, Ayers/C planning, etc.) 0 []  - Additional assistance / Altered mentation 0 []  -  Support Surface(s) Assessment (bed, cushion, seat, etc.) 0 INTERVENTIONS - Wound Cleansing / Measurement []  - Simple Wound Cleansing - one wound 0  X - Complex Wound Cleansing - multiple wounds 2 5 X - Wound Imaging (photographs - any number of wounds) 1 5 []  - Wound Tracing (instead of photographs) 0 []  - Simple Wound Measurement - one wound 0 X - Complex Wound Measurement - multiple wounds 2 5 INTERVENTIONS - Wound Dressings X - Small Wound Dressing one or multiple wounds 2 10 []  - Medium Wound Dressing one or multiple wounds 0 []  - Large Wound Dressing one or multiple wounds 0 []  - Application of Medications - topical 0 []  - Application of Medications - injection 0 INTERVENTIONS - Miscellaneous []  - External ear exam 0 []  - Specimen Collection (cultures, biopsies, blood, body fluids, etc.) 0 []  - Specimen(s) / Culture(s) sent or taken to Lab for analysis 0 []  - Patient Transfer (multiple staff / Civil Service fast streamer / Similar devices) 0 []  - Simple Staple / Suture removal (25 or less) 0 []  - Complex Staple / Suture removal (26 or more) 0 []  - Hypo / Hyperglycemic Management (close monitor of Blood Glucose) 0 []  - Ankle / Brachial Index (ABI) - do not check if billed separately 0 X - Vital Signs 1 5 Has the patient been seen at the hospital within the last three years: Yes Total Score: 145 Level Of Care: New/Established - Level 4 Electronic Signature(s) Signed: 06/01/2019 6:06:48 PM By: Deon Pilling Entered By: Deon Pilling on 06/01/2019 15:30:44 -------------------------------------------------------------------------------- Encounter Discharge Information Details Patient Name: Date of Service: Reginald Ayers 06/01/2019 2:45 PM Medical Record [id number] Patient Account Number: [id number] Date of Birth/Sex: Treating RN: 04-07-1951 (68 y.o. Reginald Ayers Primary Care Mohit Zirbes: Reginald Ayers Hams Other Clinician: Referring Jaquita Bessire: Treating Miosotis Wetsel/Extender:Robson,  Alice Reichert, Lowella Dandy in [place]: 168 Encounter Discharge Information Items Discharge Condition: Stable Ambulatory Status: Cane Discharge Destination: Home Transportation: Other Accompanied By: self Schedule Follow-up Appointment: Yes Clinical Summary of Care: Patient Declined Electronic Signature(s) Signed: 06/01/2019 4:29:59 PM By: Kela Millin Entered By: Kela Millin on 06/01/2019 15:52:13 -------------------------------------------------------------------------------- Lower Extremity Assessment Details Patient Name: Date of Service: LINZIE, BOURSIQUOT 06/01/2019 2:45 PM Medical Record [id number] Patient Account Number: [id number] Date of Birth/Sex: Treating RN: Aug 03, 1951 (68 y.o. [name] Primary Care Martesha Niedermeier: Reginald Ayers Hams Other Clinician: Referring Maddux Vanscyoc: Treating Taleeya Blondin/Extender:Robson, Alice Reichert, Lowella Dandy in [place]: 168 Edema Assessment Assessed: [Left: No] [Right: No] Edema: [Left: Ye] [Right: s] Calf Left: Right: Point of Measurement: 30 cm From Medial Instep cm 28.5 cm Ankle Left: Right: Point of Measurement: 10 cm From Medial Instep cm 19 cm Vascular Assessment Pulses: Dorsalis Pedis Palpable: [Right:No] Electronic Signature(s) Signed: 06/06/2019 5:52:09 PM By: Levan Hurst RN, BSN Entered By: Levan Hurst on 06/01/2019 15:06:40 -------------------------------------------------------------------------------- Multi Wound Chart Details Patient Name: Date of Service: Reginald Ayers 06/01/2019 2:45 PM Medical Record [id number] Patient Account Number: [id number] Date of Birth/Sex: Treating RN: 08-Jun-1951 (68 y.o. [name] Primary Care Greysen Swanton: Reginald Ayers Hams Other Clinician: Referring Khamya Topp: Treating Charleene Callegari/Extender:Robson, Alice Reichert, Lowella Dandy in [place]: 168 Vital Signs Height(in): 61 Pulse(bpm): 90 Weight(lbs): 122 Blood Pressure(mmHg): 163/87 Body Mass  Index(BMI): 23 Temperature(F): 98.3 Respiratory 16 Rate(breaths/min): Photos: [3:No Photos] [5R:No Photos] [N/A:N/A] Wound Location: [3:Right Foot - Dorsal] [5R:Right Lower Leg - Anterior N/A] Wounding Event: [3:Blister] [5R:Gradually Appeared] [N/A:N/A] Primary Etiology: [3:Trauma, Other] [5R:Diabetic Wound/Ulcer of the N/A Lower Extremity] Comorbid History: [3:Chronic sinus problems/congestion, Chronic Obstructive Pulmonary Disease (COPD), Coronary Artery (COPD), Coronary Artery Disease, Hypertension, Peripheral Arterial Disease, Peripheral Arterial Disease, Type II Diabetes, Received Type  II Diabetes,  Received Chemotherapy] [5R:Chronic sinus problems/congestion, Chronic Obstructive Pulmonary Disease Disease, Hypertension, Chemotherapy] [N/A:N/A] Date Acquired: [3:07/08/2016] [5R:04/21/2018] [N/A:N/A] Weeks of Ayers: [3:150] [5R:56] [N/A:N/A] Wound Status: [3:Open] [5R:Open] [N/A:N/A] Wound Recurrence: [3:No] [5R:Yes] [N/A:N/A] Measurements L x W x Ayers 0.8x0.6x0.1 [5R:2.8x3x0.1] [N/A:N/A] (cm) Area (cm) : [3:0.377] [5R:6.597] [N/A:N/A] Volume (cm) : [3:0.038] [5R:0.66] [N/A:N/A] % Reduction in Area: [3:77.80%] [5R:-600.30%] [N/A:N/A] % Reduction in Volume: 77.60% [5R:-602.10%] [N/A:N/A] Classification: [3:Full Thickness With Exposed Support Structures] [5R:Grade 2] [N/A:N/A] Exudate Amount: [3:Medium] [5R:Medium] [N/A:N/A] Exudate Type: [3:Serosanguineous] [5R:Serosanguineous] [N/A:N/A] Exudate Color: [3:red, brown] [5R:red, brown] [N/A:N/A] Wound Margin: [3:Flat and Intact] [5R:Flat and Intact] Granulation Amount: [3:Large (67-100%)] [5R:Medium (34-66%)] Granulation Quality: [3:Pink, Pale] [5R:Red] Necrotic Amount: [3:Small (1-33%)] [5R:Medium (34-66%)] Exposed Structures: [3:Fat Layer (Subcutaneous Tissue) Exposed: Yes Fascia: No Tendon: No Muscle: No Joint: No Bone: No Medium (34-66%)] [5R:Fat Layer (Subcutaneous Tissue) Exposed: Yes Fascia: No Tendon: No Muscle: No Joint: No  Bone: No None] Ayers Notes Electronic Signature(s) Signed: 06/01/2019 5:49:31 PM By: Baltazar Najjar MD Signed: 06/01/2019 6:06:48 PM By: Shawn Stall Entered By: Baltazar Najjar on 06/01/2019 15:33:47 -------------------------------------------------------------------------------- Multi-Disciplinary Care Plan Details Patient Name: Date of Service: Reginald Ayers 06/01/2019 2:45 PM Medical Record [id number] Patient Account Number: [id number] Date of Birth/Sex: Treating RN: [birth year] (68 y.o. Reginald Ayers Primary Care Anika Shore: Reginald Apo Other Clinician: Referring Greidy Sherard: Treating Reika Callanan/Extender:Robson, [name], Reginald Guest in [place]: 168 Active Inactive Nutrition Nursing Diagnoses: Potential for alteratiion in Nutrition/Potential for imbalanced nutrition Goals: Patient/caregiver agrees to and verbalizes understanding of need to obtain nutritional consultation Date Initiated: 03/09/2019 Target Resolution Date: 06/23/2019 Goal Status: Active Interventions: Provide education on nutrition Ayers Activities: Education provided on Nutrition : 03/23/2019 Patient referred to Primary Care Physician for further nutritional evaluation : 03/09/2019 Notes: Electronic Signature(s) Signed: 06/01/2019 6:06:48 PM By: Shawn Stall Entered By: Shawn Stall on 06/01/2019 15:21:40 -------------------------------------------------------------------------------- Pain Assessment Details Patient Name: Date of Service: DEKLEN, POPELKA 06/01/2019 2:45 PM Medical Record [id number] Patient Account Number: [id number] Date of Birth/Sex: Treating RN: [birth year] (68 y.o. Reginald Ayers Primary Care Rally Ouch: Reginald Apo Other Clinician: Referring Nevaan Bunton: Treating Ayren Zumbro/Extender:Robson, [name], Reginald Guest in [place]: 168 Active Problems Location of Pain Severity and Description of Pain Patient Has Paino No Site Locations Pain  Management and Medication Current Pain Management: Electronic Signature(s) Signed: 06/06/2019 5:52:09 PM By: Zandra Abts RN, BSN Entered By: Zandra Abts on 06/01/2019 15:02:54 -------------------------------------------------------------------------------- Patient/Caregiver Education Details Patient Name: Date of Service: Reginald Ayers 10/8/2020andnbsp2:45 PM Medical Record [id number] Patient Account Number: [id number] Date of Birth/Gender: [birth year] (68 y.o. M) Treating RN: Shawn Stall Primary Care Physician: Reginald Apo Other Clinician: Referring Physician: Treating Physician/Extender:Robson, [name], Reginald Guest in [place]: 168 Education Assessment Education Provided To: Patient Education Topics Provided Nutrition: Handouts: Nutrition Methods: Explain/Verbal Responses: Reinforcements needed Electronic Signature(s) Signed: 06/01/2019 6:06:48 PM By: Shawn Stall Entered By: Shawn Stall on 06/01/2019 15:30:10 -------------------------------------------------------------------------------- Wound Assessment Details Patient Name: Date of Service: NIKIA, MANGINO 06/01/2019 2:45 PM Medical Record [id number] Patient Account Number: [id number] Date of Birth/Sex: Treating RN: 09/07/1950 (67 y.o. Reginald Ayers Primary Care Deandra Gadson: Reginald Apo Other Clinician: Referring Margery Szostak: Treating Fredis Malkiewicz/Extender:Robson, [name], Reginald Guest in [place]: 168 Wound Status Wound Number: 3 Primary Trauma, Other Etiology: Wound Location: Right Foot - Dorsal Wound Open Wounding Event: Blister Status: Date Acquired: 07/08/2016 Comorbid Chronic sinus problems/congestion, Chronic Weeks Of Ayers: 150 History: Obstructive Pulmonary Disease (COPD), Clustered Wound: No Coronary Artery Disease, Hypertension, Peripheral Arterial Disease, Type  II Diabetes, Received Chemotherapy Photos Wound Measurements Length: (cm) 0.8 %  Reducti Width: (cm) 0.6 % Reducti Depth: (cm) 0.1 Epithelial Area: (cm) 0.377 Tunneling Volume: (cm) 0.038 Undermini Wound Description Classification: Full Thickness With Exposed Support Foul Odor Structures Slough/Fi Wound Flat and Intact Margin: Exudate Medium Amount: Exudate Type: Serosanguineous Exudate Color: red, brown Wound Bed Granulation Amount: Large (67-100%) Granulation Quality: Pink, Pale Fascia Exp Necrotic Amount: Small (1-33%) Fat Layer Necrotic Quality: Adherent Slough Tendon Exp Muscle Exp Joint Expo Bone Expos After Cleansing: No brino Yes Exposed Structure osed: No (Subcutaneous Tissue) Exposed: Yes osed: No osed: No sed: No ed: No on in Area: 77.8% on in Volume: 77.6% ization: Medium (34-66%) : No ng: No Ayers Notes Wound #3 (Right, Dorsal Foot) 1. Cleanse With Wound Cleanser 2. Periwound Care Moisturizing lotion Skin Prep 3. Primary Dressing Applied Hydrofera Blue 4. Secondary Dressing Foam Border Dressing 5. Secured With Secretary/administrator) Signed: 06/05/2019 3:43:33 PM By: Benjaman Kindler EMT/HBOT Signed: 06/06/2019 5:52:09 PM By: Zandra Abts RN, BSN Entered By: Benjaman Kindler on 06/02/2019 08:47:06 -------------------------------------------------------------------------------- Wound Assessment Details Patient Name: Date of Service: Reginald Ayers 06/01/2019 2:45 PM Medical Record [id number] Patient Account Number: [id number] Date of Birth/Sex: Treating RN: [birth year] (68 y.o. Reginald Ayers Primary Care Becker Christopher: Reginald Apo Other Clinician: Referring Rhiann Boucher: Treating Cathryne Mancebo/Extender:Robson, [name], Reginald Guest in [place]: 168 Wound Status Wound Number: [id number] Primary Diabetic Wound/Ulcer of the Lower Extremity Etiology: Wound Location: Right Lower Leg - Anterior Wound Open Wounding Event: Gradually Appeared Status: Date Acquired: 04/21/2018 Comorbid Chronic sinus  problems/congestion, Chronic Weeks Of Ayers: 56 History: Obstructive Pulmonary Disease (COPD), Clustered Wound: No Coronary Artery Disease, Hypertension, Peripheral Arterial Disease, Type II Diabetes, Received Chemotherapy Photos Wound Measurements Length: (cm) 2.8 % Reduction in A Width: (cm) 3 % Reduction in V Depth: (cm) 0.1 Epithelializatio Area: (cm) 6.597 Tunneling: Volume: (cm) 0.66 Undermining: Wound Description Classification: Grade 2 Foul Odor After Wound Margin: Flat and Intact Slough/Fibrino Exudate Amount: Medium Exudate Type: Serosanguineous Exudate Color: red, brown Wound Bed Granulation Amount: Medium (34-66%) Granulation Quality: Red Fascia Exposed: Necrotic Amount: Medium (34-66%) Fat Layer (Subcu Necrotic Quality: Adherent Slough Tendon Exposed: Muscle Exposed: Joint Exposed: Bone Exposed: Cleansing: No No Exposed Structure No taneous Tissue) Exposed: Yes No No No No rea: -600.3% olume: -602.1% n: None No No Ayers Notes Wound #5R (Right, Anterior Lower Leg) 1. Cleanse With Wound Cleanser 2. Periwound Care Moisturizing lotion Skin Prep 3. Primary Dressing Applied Hydrofera Blue 4. Secondary Dressing Foam Border Dressing 5. Secured With Secretary/administrator) Signed: 06/05/2019 3:43:33 PM By: Benjaman Kindler EMT/HBOT Signed: 06/06/2019 5:52:09 PM By: Zandra Abts RN, BSN Entered By: Benjaman Kindler on 06/02/2019 08:50:13 -------------------------------------------------------------------------------- Vitals Details Patient Name: Date of Service: Reginald Ayers 06/01/2019 2:45 PM Medical Record [id number] Patient Account Number: [id number] Date of Birth/Sex: Treating RN: 04/08/1951 (68 y.o. Reginald Ayers Primary Care Glorianna Gott: Reginald Apo Other Clinician: Referring Kamee Bobst: Treating Ola Fawver/Extender:Robson, [name], Reginald Guest in [place]: 168 Vital Signs Time Taken:  15:02 Temperature (F): 98.3 Height (in): 61 Pulse (bpm): 90 Weight (lbs): 122 Respiratory Rate (breaths/min): 16 Body Mass Index (BMI): 23 Blood Pressure (mmHg): 163/87 Reference Range: 80 - 120 mg / dl Electronic Signature(s) Signed: 06/06/2019 5:52:09 PM By: Zandra Abts RN, BSN Entered By: Zandra Abts on 06/01/2019 15:02:47

## 2019-06-20 ENCOUNTER — Telehealth: Payer: Self-pay

## 2019-06-20 NOTE — Telephone Encounter (Signed)
Pt called and said that he would ike to be seen regarding his wounds. He said that one is bleeding more and has gotten bigger.   According to last OV note he was to return in 6 months for follow up and several labs to be done. Will arrange this and bring pt in.   York Cerise, CMA

## 2019-06-28 ENCOUNTER — Other Ambulatory Visit: Payer: Self-pay | Admitting: Cardiovascular Disease

## 2019-06-28 ENCOUNTER — Telehealth: Payer: Self-pay

## 2019-06-28 NOTE — Telephone Encounter (Signed)
Received referral from Dr. Asa Saunas - poor abi's, will have this appt scheduled from the referral vs the previous telephone encounter by [facility]

## 2019-06-29 ENCOUNTER — Encounter (HOSPITAL_BASED_OUTPATIENT_CLINIC_OR_DEPARTMENT_OTHER): Payer: Medicare Other | Admitting: Internal Medicine

## 2019-07-06 ENCOUNTER — Other Ambulatory Visit: Payer: Self-pay

## 2019-07-06 ENCOUNTER — Encounter (HOSPITAL_BASED_OUTPATIENT_CLINIC_OR_DEPARTMENT_OTHER): Payer: Medicare Other | Attending: Internal Medicine | Admitting: Internal Medicine

## 2019-07-06 DIAGNOSIS — L97213 Non-pressure chronic ulcer of right calf with necrosis of muscle: Secondary | ICD-10-CM | POA: Insufficient documentation

## 2019-07-06 DIAGNOSIS — I10 Essential (primary) hypertension: Secondary | ICD-10-CM | POA: Insufficient documentation

## 2019-07-06 DIAGNOSIS — E11622 Type 2 diabetes mellitus with other skin ulcer: Secondary | ICD-10-CM | POA: Diagnosis not present

## 2019-07-06 DIAGNOSIS — E1151 Type 2 diabetes mellitus with diabetic peripheral angiopathy without gangrene: Secondary | ICD-10-CM | POA: Insufficient documentation

## 2019-07-06 DIAGNOSIS — S91301A Unspecified open wound, right foot, initial encounter: Secondary | ICD-10-CM | POA: Diagnosis not present

## 2019-07-06 DIAGNOSIS — L97812 Non-pressure chronic ulcer of other part of right lower leg with fat layer exposed: Secondary | ICD-10-CM | POA: Diagnosis not present

## 2019-07-06 NOTE — Progress Notes (Signed)
Reginald Ayers, Reginald Ayers ([id number]) Visit Report for 07/06/2019 HPI Details Patient Name: Date of Service: Reginald Ayers, Reginald Ayers 07/06/2019 2:00 PM Medical Record [id number] Patient Account Number: [id number] Date of Birth/Sex: Treating RN: 02-09-1951 (68 y.o. [name] Primary Care Provider: Katy Apo Other Clinician: Referring Provider: Treating Provider/Extender:Robson, [name], [name]: [id number] History of Present Illness HPI Description: 03/06/16; this is a patient who is a type II diabetic with a recent hemoglobin A1c of 7.6. He is referred from Dr. Allyson Sabal for aggressive wound care on a wound on his right anterior leg. He has known severe PAD and has an extensive interventional history. His left common iliac artery was stented in December 2003 and his right superficial femoral artery in April 2008. He has significant infrapopliteal disease bilaterally and has known occluded left SFA. His ABIs from September 2016 showed a ABI of 0.48 on the right and 0.52 on the left although he does not really describe claudication. He also has known carotid artery disease, coronary artery disease and is status post CABG in 2010. He tells me he smokes 3 cigarettes a day. The patient tells me his at the wound on the right anterior leg for about 6 months. He has a smaller area on the left medial leg that has been present for one month. He's been to see his primary physician Dr. polite and Dr. Allyson Sabal I am not clear what he is been using on this. It is felt that his ulcer is an ischemic ulcer on the pretibial region. It is noted that if we are unable to heal this it was felt he would need repeat angiography and potential percutaneous revascularization. 03/13/16; the area on the left medial leg is healed over. His ischemic wound on the right anterior leg is debridement of a lot of nonviable subcutaneous tissue we've been using Santyl 03/20/16; he has no open area on the left  medial leg. The wound on the right anterior leg looks ischemic. A lot of nonviable tissue. Our intake nurse reported that he had a lot of dried blood clots around the wound presumably from last week's debridement. There is no evidence of infection 04/16/16; the area on the right anterior leg is an ischemic wound. He is been back to see Dr. Allyson Sabal who states he is not a candidate for revascularization. He has a large area of occlusion from his groin down which cannot be revascularized. Since Dr. Allyson Sabal is aggressive with this type of situation I can only surmised that that's the correct decision 05/01/16 the area on the right leg is an ischemic wound. He has not a candidate for revascularization. He has not smoked in 48 hours. Has been using Santyl wound size is not any smaller however this surface looks somewhat better 05/08/16; ischemic wound on the right anterior leg. He is not a candidate for revascularization. He continues with nonsmoking for the last week. I use Prisma last week although that probably wasn't a good decision, covered in a difficult surface eschar today requiring debridement 05/15/16; patient with ischemic wound on his right anterior leg. He is not a candidate for revascularization. Complains of a lot of pain this week/burning over the wound area making it difficult for him to sleep 05/21/16; patient states less pain this week. The wound had circumferential fibrinous slough that I removed along with some nonviable subcutaneous tissue. We used Hydrofera Blue last week 05/28/16; patient states he has not had any pain. Some minor reduction in the area.  Still using Hydrofera Blue 06/04/16; no pain still using Hydrofera Blue 06/11/16 no complaints still using Hydrofera Blue 06/18/16; I changed him to Endoform last week. 06/25/16; wound still appears healthy. Minor debridement this week still using Endoform 07/02/16; the wound bed certainly appears healthier. Rim of epithelialization  superiorly was there last week. It certainly has not progressed any further. Using Endoform 07/09/16 patient is here for a review of the wound on the right anterior leg. He has a history of severe and prolonged PAD followed by Dr. Allyson Sabal in the setting of type 2 diabetes. I have been using Endoform for the last 3 or 4 weeks. He has a rim of epithelialization superiorly but I have not been able to advance this. He complains of pain but there is no clear infection 07/15/2016 -- the patient was here for a nurse visit but due to the presence of a new wound I was asked to see him for an opinion 07/23/16; the patient was seen last week by Dr. Meyer Russel in my absence. He had a new wound on his right dorsal ankle. Santyl was applied to this new area and also his original wound on the right anterior tibia. He arrives today complaining of a lot of pain 07/30/16; the patient has new wound on his right dorsal ankle from 2 weeks ago. This is as usual covered with a thick eschar. I think this is ischemic and I'm not expecting to be able to heal this. Santyl was applied for but denied by his insurance we will therefore use Medihoney. He only obtained this later today. He has left a wrap on all week with Santyl. 08/06/16 both wounds in the same situation still a gelatinous surface slough. He finds the Medihoney makes him feel pain. He could not afford this Santyl opiate 08/20/16; patient arrives today frustrated about his nonhealing wounds now 2 on his right leg. He arrived here with a wound on the right anterior leg mid aspect and is developed a second area on the right dorsal ankle. I have reviewed Dr. Hazle Coca notes and his visit from August/17. There he makes clear reference to the fact that the vessels were not revascularizable. The patient also describes fairly clear claudication at rest which makes him sleep with his legs in a dependent position. He has given up smoking as of yesterday 09/03/16; patient is  going for a second opinion on his vascular status of vein and vascular next week. We have been using Medihoney to the wounds. He is tolerating his discomfort. 09/17/16; the patient is going to have an angiogram on January 30. Miraculously I can't really see has had a recent angiogram although he has been to see Dr. Allyson Sabal who does not think here is cerebrovascular rise visible option. He does have stents in both legs that Dr. Allyson Sabal put and I think about 9 years ago. I would like to continue to use Medihoney until we see the results of his angiogram. 10/01/16; the patient had his angiogram. He has stent previously which was dilated and then a stent was placed in the right superficial femoral artery. He had failed balloon angioplasty of the right posterior tibial artery. A further stent was placed in the superficial femoral artery. An angioplasty was also done. He was noted to have severe stenosis versus occlusion of the posterior tibial artery which is the dominant vessel across the foot. Intervention was attempted here but was unsuccessful. If the patient is unable to heal his wounds it was felt he  could be brought back for repeat attempt 10/15/16- patient is here for follow-up evaluation of his right lower extremity and right dorsal foot ulcers. He was unable to make last week's appointment secondary to a death in the family. He has been using Iodosorb/Iodoflex dressing changes per home health. He states that his primary care physician follows an A1c every 3 months, with his last A1c being 7.8 which is elevated from the previous. He states he has a scheduled A1c for next month. He also states he has a follow-up appointment with Vein and Vascular in April 10/22/16- Mr. Belger did have an xray of the right foot, revealing no definite evidence of bone destruction to suggest osteomyelitis. This was performed at [facility]. He has been wearing his show with a rolled tongue since the last visit when he  leaves the home, otherwise he wears loose house slippers around the house. He is voicing no complaints or concerns since his last visit. He admits to smoking "maybe one a week" 10/29/16- patient is here for follow-up evaluation of his right lower extremity and right dorsal foot ulcers. He states that he has completely stopped smoking but does admit to dipping tobacco. We have not found out his coverage for both [employer] and Apligraf. He continues to offload this area with his walking shoe otherwise wears a slipper at home. He brought this (and it does appear to allow for relief of pressure to the dorsal part of his foot. 11/05/16; patient is here for follow-up of his right lower extremity and right dorsal foot ulcers. He has been revascularized. He no longer smokes. We have been using Prisma. 11/12/16- Mr. Olivares arrives for follow up evaluation of his right lower extremity ulcers. We have been using Prisma. He is planned for an Apligraf application today. 11/19/16- Mr.Gracey arrives for follow-up evaluation of his right lower extremity and right dorsal foot ulcer one week after Apligraf #1 application. He states the home health came out on Monday and change the foam, Kerlix and Coban and and they did not interrupt the Adaptic touch. He has a follow-up appointment with ultrasound on Monday with vein and vascular 11/26/16- he is here for follow-up evaluation. He is s/p Apligraf #1 application 2 weeks ago. he had an appointment with vascular on Monday for arterial ultrasound. In evaluation of the stent to the right SFA which indicates biphasic waveform proximal to the stent and at the origin of the stent otherwise monophasic flow throughout the stent and at the distal aspect of the stent. An ABI done in that office on Monday was 0.73 (previously 0.47). He has biphasic flow in the right posterior tibial and monophasic flow in the dorsalis pedis. He has a follow-up with Dr. Myra Gianotti on Monday, 4/9 12/03/16- He  is here for follow-up evaluation. He did see Dr. Myra Gianotti on Monday and has a scheduled aortogram with lower extremity arteriogram on the 17th. He was not compressed over the last week. His wound culture came back Oxacillin sensitive staph aureus. He was prescribed Keflex and has been taking that without any voiced complaints. He states that he started taking his Lasix, although he did not contact or have an appointment with Dr. Nehemiah Settle. He does have an appointment in May 12/10/16- he is here for follow-up evaluation. He had an intervention on 4/17 with Dr. Myra Gianotti. According to his dictation 1) the patient has a widely patent right SFA and popliteal stent 2) after the an 80% occlusion of the right PTA was treated with  atherectomy and balloon angioplasty with a residual stenosis of less than 5%. The patient is voicing significant improvement and overall feeling; states that he can now sleep in the bed without any symptoms. His completed his Keflex with no adverse reaction. He continues to self-regulate his Lasix, he is not on any potassium supplementation. 12/17/16; Arrives today with weeping edema thru the dorsal foot wound and lower leg. He has not been using his Lasix and did not have compression on his leg. According to the patient he has 40mg  Lasix at home which he takes prn but not in weeks 12/21/16; less edema and no weeping edema. Apligraf #1 12/31/16; both wounds look somewhat better especially the area distally. Apligraf #2 01/14/17; Apligraf #3. Wounds looked improved. Patient saw main and vascular. ABIs on 01/11/17 done in their office showed a patent SFA stent status post atherectomy with angioplasty of the right PTA [12/08/16] biphasic flow on the right 0.95 monophasic flow on the left 0.45. 01/28/17; Apligraf #4. Wounds not particularly better than I remember. 02/11/17; Apligraf #5 the wounds have some improvement. The distal 1 has had some epithelialization proximal one is somewhat  smaller. 02/25/17; he arrived in clinic today with a disappointing amount of surface necrotic material. This is especially true on the distal anterior foot/ankle wound. Change primary dressing the Hydrofera Blue today 03/04/17; some improvement in the proximal wound on the tibia. The distal wound on the dorsal foot/ankle has too much necrotic surface. We have been using Hydrofera Blue although there was apparently some problem with obtaining it through his home health company however the patient reports he now has it 03/11/17; wounds measure slightly smaller. Surface looks questionable although with the improvement in dimensions will likely [facility] without debridement today 03/18/17; no major change in these wounds. Questionable surface. Debridement today. [facility] 03/25[facility]; the patient has follow-up noninvasive vascular tests with vein and vascular later this month. The wounds himself are not measuring any different however the proximal wound bed looks better with the Iodoflex not much change with the area over the dorsal foot. 04/22/17; patient had repeat angiography on 04/20/17. He had a drug-coated balloon angioplasty of the right superficial femoral artery balloon angioplasty right common femoral artery. He had stenosis of previously placed stents within the right superficial femoral artery this was treated with drug coated balloon angioplasty. He continues to have single vessel runoff via the peroneal artery He has been using Hydrofera Blue 04/29/17; no major change this week. Wound beds appear healthy. No debridement. He apparently did not tolerate Iodoflex which I was using prior to modification. I'm going to change him to Endo form with hydrogel 05/13/17; wound beds again appear a lot better, paradoxically the wound on the dorsal foot appears to be progressing better than the proximal wound. Using Endoform with hydrogel 06/03/17; doing well. wounds smaller  especially the wound on the dorsal foot. using endoform 06/17/17; two-week follow-up everything is going really nicely here. Using Endoform he only has one small open area left on the right dorsal foot/ankle. Also improved on the right anterior leg. He is retired from smoking since starting with us in this clinic 07/01/17; two-week follow-up the area on his right anterior foot/ankle is just about closed. The area superiorly on the lower mid calf as adherent necrotic debris which will need debridement but it is also down in terms of dimensions. We'll use Endoform 07/19/17 on evaluation today patient appears to be doing well with his Current wound  care measures and there does not appear to be necrotic tissue which is good news. The Endoform however does seem to be causing some epithelialization although this is loose at the proximal/12 o'clock location and seems to be pulling off with dressing changes. No fevers, chills, nausea, or vomiting noted at this time. He has no significant discomfort. 08/05/17; the patient has 2 small open areas on the proximal foot and a larger wound on the right anterior leg. Change to [facility] Blue last time probably because of the hyper granulation proximally 08/26/17; the patient has 2 small open areas on the proximal foot wound which is closed. The other is covered with a thin surface eschar. There is a similar wound on the right anterior leg. We've been using Hydrofera Blue 09/09/17*; the patient went for angiogram with Dr. Myra Gianotti. Apparently a routine scheduled arterial duplex suggested occlusion of his stent however with the angiography the stent was widely patent. No procedure was necessary. He also apparently has had a long-standing pressure area on the left buttock which dates back to prior to Thanksgiving although he's never really said anything to anybody about this. He is been attempting to treated himself with triple antibiotic cream etc. We've been using  silver collagen to the wounds on his right leg and dorsal right foot. These are largely unchanged today 09/23/17; patient's wounds are smaller in both areas and are just about closed. We have been using silver collagen he shows me an area on his buttock that is in the gluteal fold. This would be an atypical place for a pressure sore like a herpetic lesion although he says it's been there for a long time 2/14/19the patient's wound is totally closed. The area on the dorsal foot is also close to closing. I looked again at the area on his left buttock in the gluteal fold. He tells me that this is been there for 6 months. This more looks more like a pressure ulcer to me than it did when I looked at this 2 weeks ago. I simply put topical antibiotics on this but we are going to have to change to a more formal dressing 10/13/17 on evaluation today patient appears to be doing very well in regard to his right dorsal foot wound. He has been tolerating the dressing changes without complication. Fortunately there does not appear to be any evidence of infection which is good news. He continues to have a left gluteal ulcer as well as he has been tolerating the Prisma dressing on although this is not significantly smaller it does sound like he's been putting other things on this as well including some kind of frame he in fact this order another cream as well although I'm not exactly sure what this is. It's something that he "researched on the Internet that is supposed to make My wound healed him seven days." At worst his pain is a 2/10 in regard to the left gluteal region but this is mainly with cleansing of the wound. 10/21/17; his right dorsal foot wound is just about closed. The area on the right anterior shin area is closed. He has a left gluteal ulcer which is probably a pressure ulcer. He is been using his own creams that he spot on the Internet. This wound is also better. 11/04/17; this is a patient with  severe PAD who is been revascularized by vein and vascular. He had 2 difficult wound areas one on the right anterior shin and the right anterior dorsal foot. More  recently he has developed a left gluteal ulcer which is probably a pressure ulcer although it has some atypical nature to it. His proximal right leg has remained healed. He has a small open area on the right dorsal foot with a nonviable surface 11/18/17; this is a patient with severe PAD who is been revascularized by vein and vascular. The area on the right proximal leg has healed over. He has a very small ischemic wound on the right dorsal foot which was the second wound. He also had a pressure ulcer on his buttock which is healed out as of today 12/02/17; wound on the dorsal foot which I think is largely ischemic. He is upset with me for doing a surface debridement on this last visit however he still has a nonviable surface on this and I think this is largely a blood flow issue 12/16/17; wound on the dorsal foot which I think is largely ischemic. He sees Dr. Rodman Sink ham again next week. We've been using Iodoflex to this not much of a healthy surface. 12/30/17; the patient went back to see vascular surgery. He had noninvasive testing. His ABI on the right was 0.70 compared to 0.60 last time on the left 0.57 compared to 0.44. TBI's were 0.3 on the right 0.29 on the left essentially unchanged from last time. Waveforms were monophasic. He was seen by a provider. Noted nonpalpable pulses. Overall they felt that they had slightly improved bilateral ABIs and right TBI. Her disease bilaterally with all waveforms monophasic. They will follow his lower extremity arterial duplex in 6 months. They're calling the areas on his feet venous stasis ulcers. He does not clearly describe claudication 01/13/18; The patient's wound is slightly larger in width and nonviable surface. We've been using Iodoflex. He has home help changing the dressings. I have been  reluctant to do debridement on this since he blames a debridement for reopening his wound actually still think this is arterial even given recent arterial results and evaluation [see above) 01/27/18; using iodoflex. better surface. 02/10/18; using Iodoflex. Surface is some better. Changed him to silver collagen today 03/02/18 on evaluation today patient continues to show signs of slow but good progress in regard to his left lower extremity ulcer. He has been tolerating the dressing changes without complication which is good news. He does have somewhat limited/poor vascular flow but nonetheless he does seem to be healing just at a much slower place and what would be otherwise recommended and expected. I do believe the silver collagen dressing change has been of benefit. 03/22/18; slow but good progress in terms of his right anterior foot wound. These are largely ischemic wounds but he has been revascularized. He is continued with silver collagen 04/07/18 on evaluation today patient appears to be doing rather well in regard to the wound on the dorsal surface of his right foot. He's been tolerating the dressing changes without complication. Fortunately there does not appear to be the evidence of infection at this time which is great news. Overall I'm very pleased with how things seem to appear at this point. 04/21/18; dorsal right foot wound secondary to PAD. Wound is making progress by 2 mm. Surface of it is passable. He is been using silver collagen 05/05/18; we received a call from home health this week that the original proximal tibial area had reopened on the right. He now has 2 open areas including the one we've been watching for a long period on the foot. The new open area seems  to be right in the same place as the original wound previously was located 05/13/2018; wounds are both actually larger. I debrided these last week both wounds are bigger. The surface looks better I been using  Iodoflex. 05/27/2018; the wounds are larger but the surface of them looks better with Iodoflex. He has a follow-up with Dr. Myra Gianotti sometime in November. I tried to move these up we were not able to get an earlier appointment I will try to text him. 06/16/2018; the patient's wounds are larger but with a generally cleaner surface. He has extensive set of investigations with Dr. Myra Gianotti on November 5. This includes ABIs TBI's and I believe an ultrasound of the stent. 07/04/2018; the patient apparently stated he felt too dizzy to go for his arterial studies on 11/5 therefore he canceled him. He has not been able to rebook them. I suspect these are ischemic wounds and I told him this. 09/01/2018; he is not been here in almost 2 months. He has been getting his dressing changed by [facility] I believe silver collagen. They are discharging him next week due to nonimprovement of the wounds predominantly. He did not follow-up with vascular surgery he was supposed to have follow-up arterial studies noninvasive. I am not sure that they have been rebooked and I do not think he has been seen. 1/23; patient's wounds are slightly larger. I believe this is ischemic. He canceled his last follow-up appointment with Dr. Myra Gianotti him that included noninvasive arterial studies. He says this was because of vertigo. He has not rescheduled these. He also has lost home health because of the chronicity of his wounds 2/6; the patient's wound on his right anterior tibia actually looks quite good. Not much difference in size but the surface of the wound looks good. He has a small area on the dorsal foot debris on the surface of this which I debrided with Anasept and gauze. He has not made a follow-up with vein and vascular 2/20; the patient's wound on his right anterior tibia continues to look good although it is not coming down in size much. The area on his right dorsal foot is about the same. Tightly adherent debris. The wound  surface does not look healthy care. The patient is expressed his to sustain for mechanical debridement in the past, I therefore been avoiding this at least until he goes back for repeat vascular appointment. So far he has not done this he is mingling for ENT appointments related to vertigo 3/5; right anterior tibia continues to look satisfactory perhaps slightly smaller. The area on the right dorsal foot is smaller. We have been using Hydrofera Blue. 4/16; the patient wounds have actually done well in the hiatus since we have last seen him. He is using Hydrofera Blue ready 6/25; we have not seen this patient in over 2 months as he has been in the isolation phase for the Covid epidemic. He is using Hydrofera Blue ready. He has 2 wounds right on the right anterior mid tibia and one on the right dorsal foot. Both of these are somewhat improved today. Especially the dorsal 1 over the tibia. He has not been for follow- up arterial studies but I have got him to agree to allow [place] to set these up. I 7/16; the patient's wound proximally in the mid tibia area has closed over once again. We had this closed over before and he reopened the area on the dorsal foot appears better. He has been using Hydrofera Blue. We  do not have an appointment for the follow-up arterial studies I ordered nor do I think he is really all that interested in getting them done. 7/30; patient has been using Hydrofera Blue. The dorsal foot wound looks some better however he has had a reopening in the mid tibia area. This is been closed over the last time. The opening is very superficial. We have been trying to get him follow-up vascular studies done with vein and vascular for about a year. He refused to do these at one point because he was at work getting worked up for vertigo. He is now more ready to move forward with this. 8/20; patient is using Hydrofera Blue. He has not made an appointment for his follow-up noninvasive  vascular studies and I have talked to him about this again. Both wound areas are still open on the right mid tibia and the right dorsal foot although I think they are smaller 05/04/19-Patient is back after his vascular visit his studies revealed that he is got ABI of 0.6 on the right, 0.46 on the left, monophasic flow to both right and left anterior tibial arteries is got great pressures to the right great toe. 10/8; we still do not have a vascular surgery appointment 1 month later. I think he may require a repeat angiogram his wounds on the right anterior to mid tibia is quite a bit larger the area on the right dorsal ankle, is small. Very little open area here 11/12; patient has not been here in over a month. He still did not have a vascular surgery appointment however before he left the clinic today we managed to arrange that for 12/9. The area on his right dorsal foot is surprisingly closed over today however the area in the mid tibia is quite a bit larger than when we saw this last time. Electronic Signature(s) Signed: 07/06/2019 5:47:05 PM By: Baltazar Najjar MD Entered By: Baltazar Najjar on 07/06/2019 15:29:03 -------------------------------------------------------------------------------- Physical Exam Details Patient Name: Date of Service: [name] 07/06/2019 2:00 PM Medical Record [id number] Patient Account Number: [id number] Date of Birth/Sex: Treating RN: [birth year] (68 y.o. [name] Primary Care Provider: Katy Apo Other Clinician: Referring Provider: Treating Provider/Extender:Robson, [name], [name]: 173 Constitutional Patient is hypertensive.. Pulse regular and within target range for patient.Marland Kitchen Respirations regular, non-labored and within target range.. Temperature is normal and within the target range for the patient.Marland Kitchen Appears in no distress. Eyes Conjunctivae clear. No discharge.no icterus. Respiratory work of  breathing is normal. Cardiovascular His pedal pulses are not palpable on the right however the popliteal pulses palpable. Integumentary (Hair, Skin) Skin and subcutaneous tissue without rashes, lesions. Psychiatric appears at normal baseline. Notes Wound exam; right lower leg anterior tibial wound is again much larger. The dorsal foot has closed over. No debridement is required in any wound. Electronic Signature(s) Signed: 07/06/2019 5:47:05 PM By: Baltazar Najjar MD Entered By: Baltazar Najjar on 07/06/2019 15:31:04 -------------------------------------------------------------------------------- Physician Orders Details Patient Name: Date of Service: [name] 07/06/2019 2:00 PM Medical Record [id number] Patient Account Number: [id number] Date of Birth/Sex: Treating RN: 12-24-1950 (68 y.o. [name] Primary Care Provider: Katy Apo Other Clinician: Referring Provider: Treating Provider/Extender:Robson, [name], [name]: [phone] Verbal / Phone Orders: No Diagnosis Coding ICD-10 Coding Code Description E11.622 Type 2 diabetes mellitus with other skin ulcer E11.621 Type 2 diabetes mellitus with foot ulcer E11.51 Type 2 diabetes mellitus with diabetic peripheral angiopathy without gangrene L97.512  Non-pressure chronic ulcer of other part of right foot with fat layer exposed Follow-up Appointments Return appointment in 3 weeks. - week after thanksgiving. Dressing Change Frequency Other: - twice a week Skin Barriers/Peri-Wound Care Barrier cream - apply zinc oxide around the wound bed for wetness, reddness, or maceration. Moisturizing lotion - to leg Wound Cleansing May shower with protection. Primary Wound Dressing Wound #5R Right,Anterior Lower Leg Collagen - moisten with hydrogel. Secondary Dressing Wound #5R Right,Anterior Lower Leg Foam Border - or bordered gauze. Edema Control Avoid standing for long periods of  time Elevate legs to the level of the heart or above for 30 minutes daily and/or when sitting, a frequency of: - throughout the day Other: - patient to purchase support stockings for protection. Off-Loading Turn and reposition every 2 hours Electronic Signature(s) Signed: 07/06/2019 5:17:30 PM By: Shawn Stall Signed: 07/06/2019 5:47:05 PM By: Baltazar Najjar MD Entered By: Shawn Stall on 07/06/2019 14:43:58 -------------------------------------------------------------------------------- Problem List Details Patient Name: Date of Service: [name] 07/06/2019 2:00 PM Medical Record [id number] Patient Account Number: [id number] Date of Birth/Sex: Treating RN: 01/19/1951 (68 y.o. 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 03/06/2016 No Yes of muscle E11.622 Type 2 diabetes mellitus with other skin ulcer 03/06/2016 No Yes E11.621 Type 2 diabetes mellitus with foot ulcer 11/19/2016 No Yes E11.51 Type 2 diabetes mellitus with diabetic peripheral 03/06/2016 No Yes angiopathy without gangrene Inactive Problems ICD-10 Code Description Active Date Inactive Date L97.512 Non-pressure chronic ulcer of other part of right foot with fat 03/03/2018 03/03/2018 layer exposed Resolved Problems ICD-10 Code Description Active Date Resolved Date L97.221 Non-pressure chronic ulcer of left calf limited to breakdown of 03/06/2016 03/06/2016 skin L97.312 Non-pressure chronic ulcer of right ankle with fat layer 07/15/2016 07/15/2016 exposed L89.322 Pressure ulcer of left buttock, stage 2 09/09/2017 09/09/2017 Electronic Signature(s) Signed: 07/06/2019 5:47:05 PM By: Baltazar Najjar MD Entered By: Baltazar Najjar on 07/06/2019 15:26:27 -------------------------------------------------------------------------------- Progress Note Details Patient Name: Date of Service: [name] 07/06/2019 2:00 PM Medical Record [id number] Patient Account Number: [id number] Date of Birth/Sex: Treating RN: [birth year] (68 y.o. [name] Primary Care Provider: Other Clinician: Katy Apo Referring Provider: Treating Provider/Extender:Robson, [name], [name]: 173 Subjective History of Present Illness (HPI) 03/06/16; this is a patient who is a type II diabetic with a recent hemoglobin A1c of 7.6. He is referred from Dr. Allyson Sabal for aggressive wound care on a wound on his right anterior leg. He has known severe PAD and has an extensive interventional history. His left common iliac artery was stented in December 2003 and his right superficial femoral artery in April 2008. He has significant infrapopliteal disease bilaterally and has known occluded left SFA. His ABIs from September 2016 showed a ABI of 0.48 on the right and 0.52 on the left although he does not really describe claudication. He also has known carotid artery disease, coronary artery disease and is status post CABG in 2010. He tells me he smokes 3 cigarettes a day. The patient tells me his at the wound on the right anterior leg for about 6 months. He has a smaller area on the left medial leg that has been present for one month. He's been to see his primary physician Dr. Conservation officer, historic buildings  and Dr. Allyson Sabal I am not clear what he is been using on this. It is felt that his ulcer is an ischemic ulcer on the pretibial region. It is noted that if we are unable to heal this it was felt he would need repeat angiography and potential percutaneous revascularization. 03/13/16; the area on the left medial leg is healed over. His ischemic wound on the right anterior leg is debridement of a lot of  nonviable subcutaneous tissue we've been using Santyl 03/20/16; he has no open area on the left medial leg. The wound on the right anterior leg looks ischemic. A lot of nonviable tissue. Our intake nurse reported that he had a lot of dried blood clots around the wound presumably from last week's debridement. There is no evidence of infection 04/16/16; the area on the right anterior leg is an ischemic wound. He is been back to see Dr. Allyson Sabal who states he is not a candidate for revascularization. He has a large area of occlusion from his groin down which cannot be revascularized. Since Dr. Allyson Sabal is aggressive with this type of situation I can only surmised that that's the correct decision 05/01/16 the area on the right leg is an ischemic wound. He has not a candidate for revascularization. He has not smoked in 48 hours. Has been using Santyl wound size is not any smaller however this surface looks somewhat better 05/08/16; ischemic wound on the right anterior leg. He is not a candidate for revascularization. He continues with nonsmoking for the last week. I use Prisma last week although that probably wasn't a good decision, covered in a difficult surface eschar today requiring debridement 05/15/16; patient with ischemic wound on his right anterior leg. He is not a candidate for revascularization. Complains of a lot of pain this week/burning over the wound area making it difficult for him to sleep 05/21/16; patient states less pain this week. The wound had circumferential fibrinous slough that I removed along with some nonviable subcutaneous tissue. We used Hydrofera Blue last week 05/28/16; patient states he has not had any pain. Some minor reduction in the area. Still using Hydrofera Blue 06/04/16; no pain still using Hydrofera Blue 06/11/16 no complaints still using Hydrofera Blue 06/18/16; I changed him to Endoform last week. 06/25/16; wound still appears healthy. Minor debridement this week still  using Endoform 07/02/16; the wound bed certainly appears healthier. Rim of epithelialization superiorly was there last week. It certainly has not progressed any further. Using Endoform 07/09/16 patient is here for a review of the wound on the right anterior leg. He has a history of severe and prolonged PAD followed by Dr. Allyson Sabal in the setting of type 2 diabetes. I have been using Endoform for the last 3 or 4 weeks. He has a rim of epithelialization superiorly but I have not been able to advance this. He complains of pain but there is no clear infection 07/15/2016 -- the patient was here for a nurse visit but due to the presence of a new wound I was asked to see him for an opinion 07/23/16; the patient was seen last week by Dr. Meyer Russel in my absence. He had a new wound on his right dorsal ankle. Santyl was applied to this new area and also his original wound on the right anterior tibia. He arrives today complaining of a lot of pain 07/30/16; the patient has new wound on his right dorsal ankle from 2 weeks ago. This is as usual covered with a thick eschar.  I think this is ischemic and I'm not expecting to be able to heal this. Santyl was applied for but denied by his insurance we will therefore use Medihoney. He only obtained this later today. He has left a wrap on all week with Santyl. 08/06/16 both wounds in the same situation still a gelatinous surface slough. He finds the Medihoney makes him feel pain. He could not afford this Santyl opiate 08/20/16; patient arrives today frustrated about his nonhealing wounds now o2 on his right leg. He arrived here with a wound on the right anterior leg mid aspect and is developed a second area on the right dorsal ankle. I have reviewed Dr. Hazle Coca notes and his visit from August/17. There he makes clear reference to the fact that the vessels were not revascularizable. The patient also describes fairly clear claudication at rest which makes him sleep with his  legs in a dependent position. He has given up smoking as of yesterday 09/03/16; patient is going for a second opinion on his vascular status of vein and vascular next week. We have been using Medihoney to the wounds. He is tolerating his discomfort. 09/17/16; the patient is going to have an angiogram on January 30. Miraculously I can't really see has had a recent angiogram although he has been to see Dr. Allyson Sabal who does not think here is cerebrovascular rise visible option. He does have stents in both legs that Dr. Allyson Sabal put and I think about 9 years ago. I would like to continue to use Medihoney until we see the results of his angiogram. 10/01/16; the patient had his angiogram. He has stent previously which was dilated and then a stent was placed in the right superficial femoral artery. He had failed balloon angioplasty of the right posterior tibial artery. A further stent was placed in the superficial femoral artery. An angioplasty was also done. He was noted to have severe stenosis versus occlusion of the posterior tibial artery which is the dominant vessel across the foot. Intervention was attempted here but was unsuccessful. If the patient is unable to heal his wounds it was felt he could be brought back for repeat attempt 10/15/16- patient is here for follow-up evaluation of his right lower extremity and right dorsal foot ulcers. He was unable to make last week's appointment secondary to a death in the family. He has been using Iodosorb/Iodoflex dressing changes per home health. He states that his primary care physician follows an A1c every 3 months, with his last A1c being 7.8 which is elevated from the previous. He states he has a scheduled A1c for next month. He also states he has a follow-up appointment with Vein and Vascular in April 10/22/16- Mr. Cremer did have an xray of the right foot, revealing no definite evidence of bone destruction to suggest osteomyelitis. This was performed at  [facility]. He has been wearing his show with a rolled tongue since the last visit when he leaves the home, otherwise he wears loose house slippers around the house. He is voicing no complaints or concerns since his last visit. He admits to smoking "maybe one a week" 10/29/16- patient is here for follow-up evaluation of his right lower extremity and right dorsal foot ulcers. He states that he has completely stopped smoking but does admit to dipping tobacco. We have not found out his coverage for both [employer] and Apligraf. He continues to offload this area with his walking shoe otherwise wears a slipper at home. He brought this (and it  does appear to allow for relief of pressure to the dorsal part of his foot. 11/05/16; patient is here for follow-up of his right lower extremity and right dorsal foot ulcers. He has been revascularized. He no longer smokes. We have been using Prisma. 11/12/16- Mr. Knope arrives for follow up evaluation of his right lower extremity ulcers. We have been using Prisma. He is planned for an Apligraf application today. 11/19/16- Mr.Geidel arrives for follow-up evaluation of his right lower extremity and right dorsal foot ulcer one week after Apligraf #1 application. He states the home health came out on Monday and change the foam, Kerlix and Coban and and they did not interrupt the Adaptic touch. He has a follow-up appointment with ultrasound on Monday with vein and vascular 11/26/16- he is here for follow-up evaluation. He is s/p Apligraf #1 application 2 weeks ago. he had an appointment with vascular on Monday for arterial ultrasound. In evaluation of the stent to the right SFA which indicates biphasic waveform proximal to the stent and at the origin of the stent otherwise monophasic flow throughout the stent and at the distal aspect of the stent. An ABI done in that office on Monday was 0.73 (previously 0.47). He has biphasic flow in the right posterior tibial and  monophasic flow in the dorsalis pedis. He has a follow-up with Dr. Myra Gianotti on Monday, 4/9 12/03/16- He is here for follow-up evaluation. He did see Dr. Myra Gianotti on Monday and has a scheduled aortogram with lower extremity arteriogram on the 17th. He was not compressed over the last week. His wound culture came back Oxacillin sensitive staph aureus. He was prescribed Keflex and has been taking that without any voiced complaints. He states that he started taking his Lasix, although he did not contact or have an appointment with Dr. Nehemiah Settle. He does have an appointment in May 12/10/16- he is here for follow-up evaluation. He had an intervention on 4/17 with Dr. Myra Gianotti. According to his dictation 1) the patient has a widely patent right SFA and popliteal stent 2) after the an 80% occlusion of the right PTA was treated with atherectomy and balloon angioplasty with a residual stenosis of less than 5%. The patient is voicing significant improvement and overall feeling; states that he can now sleep in the bed without any symptoms. His completed his Keflex with no adverse reaction. He continues to self-regulate his Lasix, he is not on any potassium supplementation. 12/17/16; Arrives today with weeping edema thru the dorsal foot wound and lower leg. He has not been using his Lasix and did not have compression on his leg. According to the patient he has 40mg  Lasix at home which he takes prn but not in weeks 12/21/16; less edema and no weeping edema. Apligraf #1 12/31/16; both wounds look somewhat better especially the area distally. Apligraf #2 01/14/17; Apligraf #3. Wounds looked improved. Patient saw main and vascular. ABIs on 01/11/17 done in their office showed a patent SFA stent status post atherectomy with angioplasty of the right PTA [12/08/16] biphasic flow on the right 0.95 monophasic flow on the left 0.45. 01/28/17; Apligraf #4. Wounds not particularly better than I remember. 02/11/17; Apligraf #5 the  wounds have some improvement. The distal 1 has had some epithelialization proximal one is somewhat smaller. 02/25/17; he arrived in clinic today with a disappointing amount of surface necrotic material. This is especially true on the distal anterior foot/ankle wound. Change primary dressing the Hydrofera Blue today 03/04/17; some improvement in the proximal wound on  the tibia. The distal wound on the dorsal foot/ankle has too much necrotic surface. We have been using Hydrofera Blue although there was apparently some problem with obtaining it through his home health company however the patient reports he now has it 03/11/17; wounds measure slightly smaller. Surface looks questionable although with the improvement in dimensions will likely continue with the [facility] without debridement today 03/18/17; no major change in these wounds. Questionable surface. Debridement today. Using [facility] 03/25/17; the patient has follow-up noninvasive vascular tests with vein and vascular later this month. The wounds himself are not measuring any different however the proximal wound bed looks better with the Iodoflex not much change with the area over the dorsal foot. 04/22/17; patient had repeat angiography on 04/20/17. He had a drug-coated balloon angioplasty of the right superficial femoral artery balloon angioplasty right common femoral artery. He had stenosis of previously placed stents within the right superficial femoral artery this was treated with drug coated balloon angioplasty. He continues to have single vessel runoff via the peroneal artery He has been using Hydrofera Blue 04/29/17; no major change this week. Wound beds appear healthy. No debridement. He apparently did not tolerate Iodoflex which I was using prior to modification. I'm going to change him to Endo form with hydrogel 05/13/17; wound beds again appear a lot better, paradoxically the wound on the dorsal foot appears to be  progressing better than the proximal wound. Using Endoform with hydrogel 06/03/17; doing well. wounds smaller especially the wound on the dorsal foot. using endoform 06/17/17; two-week follow-up everything is going really nicely here. Using Endoform he only has one small open area left on the right dorsal foot/ankle. Also improved on the right anterior leg. He is retired from smoking since starting with [place] in this clinic 07/01/17; two-week follow-up the area on his right anterior foot/ankle is just about closed. The area superiorly on the lower mid calf as adherent necrotic debris which will need debridement but it is also down in terms of dimensions. We'll use Endoform 07/19/17 on evaluation today patient appears to be doing well with his Current wound care measures and there does not appear to be necrotic tissue which is good news. The Endoform however does seem to be causing some epithelialization although this is loose at the proximal/12 o'clock location and seems to be pulling off with dressing changes. No fevers, chills, nausea, or vomiting noted at this time. He has no significant discomfort. 08/05/17; the patient has 2 small open areas on the proximal foot and a larger wound on the right anterior leg. Change to [facility] Blue last time probably because of the hyper granulation proximally 08/26/17; the patient has 2 small open areas on the proximal foot wound which is closed. The other is covered with a thin surface eschar. There is a similar wound on the right anterior leg. We've been using Hydrofera Blue 09/09/17*; the patient went for angiogram with Dr. Myra Gianotti. Apparently a routine scheduled arterial duplex suggested occlusion of his stent however with the angiography the stent was widely patent. No procedure was necessary. He also apparently has had a long-standing pressure area on the left buttock which dates back to prior to Thanksgiving although he's never really said anything to  anybody about this. He is been attempting to treated himself with triple antibiotic cream etc. We've been using silver collagen to the wounds on his right leg and dorsal right foot. These are largely unchanged today 09/23/17; patient's wounds are smaller in both areas  and are just about closed. We have been using silver collagen he shows me an area on his buttock that is in the gluteal fold. This would be an atypical place for a pressure sore like a herpetic lesion although he says it's been there for a long time 2/14/19the patient's wound is totally closed. The area on the dorsal foot is also close to closing. I looked again at the area on his left buttock in the gluteal fold. He tells me that this is been there for 6 months. This more looks more like a pressure ulcer to me than it did when I looked at this 2 weeks ago. I simply put topical antibiotics on this but we are going to have to change to a more formal dressing 10/13/17 on evaluation today patient appears to be doing very well in regard to his right dorsal foot wound. He has been tolerating the dressing changes without complication. Fortunately there does not appear to be any evidence of infection which is good news. He continues to have a left gluteal ulcer as well as he has been tolerating the Prisma dressing on although this is not significantly smaller it does sound like he's been putting other things on this as well including some kind of frame he in fact this order another cream as well although I'm not exactly sure what this is. It's something that he "researched on the Internet that is supposed to make My wound healed him seven days." At worst his pain is a 2/10 in regard to the left gluteal region but this is mainly with cleansing of the wound. 10/21/17; his right dorsal foot wound is just about closed. The area on the right anterior shin area is closed. He has a left gluteal ulcer which is probably a pressure ulcer. He is been  using his own creams that he spot on the Internet. This wound is also better. 11/04/17; this is a patient with severe PAD who is been revascularized by vein and vascular. He had 2 difficult wound areas one on the right anterior shin and the right anterior dorsal foot. More recently he has developed a left gluteal ulcer which is probably a pressure ulcer although it has some atypical nature to it. His proximal right leg has remained healed. He has a small open area on the right dorsal foot with a nonviable surface 11/18/17; this is a patient with severe PAD who is been revascularized by vein and vascular. The area on the right proximal leg has healed over. He has a very small ischemic wound on the right dorsal foot which was the second wound. He also had a pressure ulcer on his buttock which is healed out as of today 12/02/17; wound on the dorsal foot which I think is largely ischemic. He is upset with me for doing a surface debridement on this last visit however he still has a nonviable surface on this and I think this is largely a blood flow issue 12/16/17; wound on the dorsal foot which I think is largely ischemic. He sees Dr. Laurel Sink ham again next week. We've been using Iodoflex to this not much of a healthy surface. 12/30/17; the patient went back to see vascular surgery. He had noninvasive testing. His ABI on the right was 0.70 compared to 0.60 last time on the left 0.57 compared to 0.44. TBI's were 0.3 on the right 0.29 on the left essentially unchanged from last time. Waveforms were monophasic. He was seen by a provider. Noted  nonpalpable pulses. Overall they felt that they had slightly improved bilateral ABIs and right TBI. Her disease bilaterally with all waveforms monophasic. They will follow his lower extremity arterial duplex in 6 months. They're calling the areas on his feet venous stasis ulcers. He does not clearly describe claudication 01/13/18; The patient's wound is slightly larger in  width and nonviable surface. We've been using Iodoflex. He has home help changing the dressings. I have been reluctant to do debridement on this since he blames a debridement for reopening his wound actually still think this is arterial even given recent arterial results and evaluation [see above) 01/27/18; using iodoflex. better surface. 02/10/18; using Iodoflex. Surface is some better. Changed him to silver collagen today 03/02/18 on evaluation today patient continues to show signs of slow but good progress in regard to his left lower extremity ulcer. He has been tolerating the dressing changes without complication which is good news. He does have somewhat limited/poor vascular flow but nonetheless he does seem to be healing just at a much slower place and what would be otherwise recommended and expected. I do believe the silver collagen dressing change has been of benefit. 03/22/18; slow but good progress in terms of his right anterior foot wound. These are largely ischemic wounds but he has been revascularized. He is continued with silver collagen 04/07/18 on evaluation today patient appears to be doing rather well in regard to the wound on the dorsal surface of his right foot. He's been tolerating the dressing changes without complication. Fortunately there does not appear to be the evidence of infection at this time which is great news. Overall I'm very pleased with how things seem to appear at this point. 04/21/18; dorsal right foot wound secondary to PAD. Wound is making progress by 2 mm. Surface of it is passable. He is been using silver collagen 05/05/18; we received a call from home health this week that the original proximal tibial area had reopened on the right. He now has 2 open areas including the one we've been watching for a long period on the foot. The new open area seems to be right in the same place as the original wound previously was located 05/13/2018; wounds are both actually  larger. I debrided these last week both wounds are bigger. The surface looks better I been using Iodoflex. 05/27/2018; the wounds are larger but the surface of them looks better with Iodoflex. He has a follow-up with Dr. Trula Slade sometime in November. I tried to move these up we were not able to get an earlier appointment I will try to text him. 06/16/2018; the patient's wounds are larger but with a generally cleaner surface. He has extensive set of investigations with Dr. Trula Slade on November 5. This includes ABIs TBI's and I believe an ultrasound of the stent. 07/04/2018; the patient apparently stated he felt too dizzy to go for his arterial studies on 11/5 therefore he canceled him. He has not been able to rebook them. I suspect these are ischemic wounds and I told him this. 09/01/2018; he is not been here in almost 2 months. He has been getting his dressing changed by [facility] I believe silver collagen. They are discharging him next week due to nonimprovement of the wounds predominantly. He did not follow-up with vascular surgery he was supposed to have follow-up arterial studies noninvasive. I am not sure that they have been rebooked and I do not think he has been seen. 1/23; patient's wounds are slightly larger. I believe  this is ischemic. He canceled his last follow-up appointment with Dr. Myra Gianotti him that included noninvasive arterial studies. He says this was because of vertigo. He has not rescheduled these. He also has lost home health because of the chronicity of his wounds 2/6; the patient's wound on his right anterior tibia actually looks quite good. Not much difference in size but the surface of the wound looks good. He has a small area on the dorsal foot debris on the surface of this which I debrided with Anasept and gauze. He has not made a follow-up with vein and vascular 2/20; the patient's wound on his right anterior tibia continues to look good although it is not coming down in  size much. The area on his right dorsal foot is about the same. Tightly adherent debris. The wound surface does not look healthy care. The patient is expressed his to sustain for mechanical debridement in the past, I therefore been avoiding this at least until he goes back for repeat vascular appointment. So far he has not done this he is mingling for ENT appointments related to vertigo 3/5; right anterior tibia continues to look satisfactory perhaps slightly smaller. The area on the right dorsal foot is smaller. We have been using Hydrofera Blue. 4/16; the patient wounds have actually done well in the hiatus since we have last seen him. He is using Hydrofera Blue ready 6/25; we have not seen this patient in over 2 months as he has been in the isolation phase for the Covid epidemic. He is using Hydrofera Blue ready. He has 2 wounds right on the right anterior mid tibia and one on the right dorsal foot. Both of these are somewhat improved today. Especially the dorsal 1 over the tibia. He has not been for follow- up arterial studies but I have got him to agree to allow [place] to set these up. I 7/16; the patient's wound proximally in the mid tibia area has closed over once again. We had this closed over before and he reopened the area on the dorsal foot appears better. He has been using Hydrofera Blue. We do not have an appointment for the follow-up arterial studies I ordered nor do I think he is really all that interested in getting them done. 7/30; patient has been using Hydrofera Blue. The dorsal foot wound looks some better however he has had a reopening in the mid tibia area. This is been closed over the last time. The opening is very superficial. We have been trying to get him follow-up vascular studies done with vein and vascular for about a year. He refused to do these at one point because he was at work getting worked up for vertigo. He is now more ready to move forward with this. 8/20;  patient is using Hydrofera Blue. He has not made an appointment for his follow-up noninvasive vascular studies and I have talked to him about this again. Both wound areas are still open on the right mid tibia and the right dorsal foot although I think they are smaller 05/04/19-Patient is back after his vascular visit his studies revealed that he is got ABI of 0.6 on the right, 0.46 on the left, monophasic flow to both right and left anterior tibial arteries is got great pressures to the right great toe. 10/8; we still do not have a vascular surgery appointment 1 month later. I think he may require a repeat angiogram his wounds on the right anterior to mid tibia is quite a  bit larger the area on the right dorsal ankle, is small. Very little open area here 11/12; patient has not been here in over a month. He still did not have a vascular surgery appointment however before he left the clinic today we managed to arrange that for 12/9. The area on his right dorsal foot is surprisingly closed over today however the area in the mid tibia is quite a bit larger than when we saw this last time. Objective Constitutional Patient is hypertensive.. Pulse regular and within target range for patient.Marland Kitchen Respirations regular, non-labored and within target range.. Temperature is normal and within the target range for the patient.Marland Kitchen Appears in no distress. Vitals Time Taken: 1:50 PM, Height: 61 in, Weight: 122 lbs, BMI: 23, Temperature: 98.2 F, Pulse: 88 bpm, Respiratory Rate: 18 breaths/min, Blood Pressure: 155/55 mmHg. Eyes Conjunctivae clear. No discharge.no icterus. Respiratory work of breathing is normal. Cardiovascular His pedal pulses are not palpable on the right however the popliteal pulses palpable. Psychiatric appears at normal baseline. General Notes: Wound exam; right lower leg anterior tibial wound is again much larger. The dorsal foot has closed over. No debridement is required in any  wound. Integumentary (Hair, Skin) Skin and subcutaneous tissue without rashes, lesions. Wound #3 status is Open. Original cause of wound was Blister. The wound is located on the Right,Dorsal Foot. The wound measures 0cm length x 0cm width x 0cm depth; 0cm^2 area and 0cm^3 volume. There is no tunneling or undermining noted. There is a none present amount of drainage noted. The wound margin is flat and intact. There is no granulation within the wound bed. There is no necrotic tissue within the wound bed. Wound #5R status is Open. Original cause of wound was Gradually Appeared. The wound is located on the Right,Anterior Lower Leg. The wound measures 4.1cm length x 4cm width x 0.1cm depth; 12.881cm^2 area and 1.288cm^3 volume. There is Fat Layer (Subcutaneous Tissue) Exposed exposed. There is no tunneling or undermining noted. There is a medium amount of serosanguineous drainage noted. The wound margin is flat and intact. There is medium (34-66%) red granulation within the wound bed. There is a medium (34-66%) amount of necrotic tissue within the wound bed including Adherent Slough. Assessment Active Problems ICD-10 Non-pressure chronic ulcer of right calf with necrosis of muscle Type 2 diabetes mellitus with other skin ulcer Type 2 diabetes mellitus with foot ulcer Type 2 diabetes mellitus with diabetic peripheral angiopathy without gangrene Plan Follow-up Appointments: Return appointment in 3 weeks. - week after thanksgiving. Dressing Change Frequency: Other: - twice a week Skin Barriers/Peri-Wound Care: Barrier cream - apply zinc oxide around the wound bed for wetness, reddness, or maceration. Moisturizing lotion - to leg Wound Cleansing: May shower with protection. Primary Wound Dressing: Wound #5R Right,Anterior Lower Leg: Collagen - moisten with hydrogel. Secondary Dressing: Wound #5R Right,Anterior Lower Leg: Foam Border - or bordered gauze. Edema Control: Avoid standing for  long periods of time Elevate legs to the level of the heart or above for 30 minutes daily and/or when sitting, a frequency of: - throughout the day Other: - patient to purchase support stockings for protection. Off-Loading: Turn and reposition every 2 hours 1. I change the primary dressing to collagen moistened with hydrogel with border foam 2. We managed to arrange an appointment with vein and vascular in follow-up for his PAD on 12/9. He also tells [place] he has an appoint with Dr. Allyson Sabal on 11/25 however I think he is in and managing his cardiac  disease Electronic Signature(s) Signed: 07/06/2019 5:47:05 PM By: Baltazar Najjar MD Entered By: Baltazar Najjar on 07/06/2019 15:32:33 -------------------------------------------------------------------------------- SuperBill Details Patient Name: Date of Service: [name] 07/06/2019 Medical Record [id number] Patient Account Number: [id number] Date of Birth/Sex: Treating RN: Aug 07, 1951 (68 y.o. Harlon Flor, Millard.Loa Primary Care Provider: Katy Apo Other Clinician: Referring Provider: Treating Provider/Extender:Robson, [name], [name]: 173 Diagnosis Coding ICD-10 Codes Code Description [phone] Non-pressure chronic ulcer of right calf with necrosis of muscle E11.622 Type 2 diabetes mellitus with other skin ulcer E11.621 Type 2 diabetes mellitus with foot ulcer E11.51 Type 2 diabetes mellitus with diabetic peripheral angiopathy without gangrene Facility Procedures The patient participates with Medicare or their insurance follows the Medicare Facility Guidelines: CPT4 Code Description Modifier Quantity [id number] [phone] - WOUND CARE VISIT-LEV 4 EST PT 1 Physician Procedures Electronic Signature(s) Signed: 07/06/2019 5:47:05 PM By: Baltazar Najjar MD Entered By: Baltazar Najjar on 07/06/2019 15:33:06

## 2019-07-07 NOTE — Progress Notes (Signed)
TYLERJAMES, HOGLUND ([id number]) Visit Report for 07/06/2019 Fall Risk Assessment Details Patient Name: Date of Service: Reginald Ayers, Reginald Ayers 07/06/2019 2:00 PM Medical Record [id number] Patient Account Number: [id number] Date of Birth/Sex: Treating RN: May 22, 1951 (68 y.o. Reginald Ayers Primary Care Lenix Kidd: Kandice Hams Other Clinician: Referring Cipriana Biller: Treating Vickie Melnik/Extender:Robson, Alice Reichert, Lowella Dandy in Treatment: 173 Fall Risk Assessment Items Have you had 2 or more falls in the last 12 monthso 0 No Have you had any fall that resulted in injury in the last 12 monthso 0 No FALLS RISK SCREEN History of falling - immediate or within 3 months 25 Yes Secondary diagnosis (Do you have 2 or more medical diagnoseso) 15 Yes Ambulatory aid None/bed rest/wheelchair/nurse 0 No Crutches/cane/walker 15 Yes Furniture 0 No Intravenous therapy Access/Saline/Heparin Lock 0 No Weak (short steps with or without shuffle, stooped but able to lift head 10 Yes while walking, may seek support from furniture) Impaired (short steps with shuffle, may have difficulty arising from chair, 0 No head down, impaired balance) Mental Status Oriented to own ability 0 Yes Overestimates or forgets limitations 0 No Risk Level: High Risk Score: 65 Electronic Signature(s) Signed: 07/07/2019 5:20:59 PM By: Kela Millin Entered By: Kela Millin on 07/06/2019 13:52:29

## 2019-07-07 NOTE — Progress Notes (Signed)
Reginald Ayers ([id number]) Visit Report for 07/06/2019 Arrival Information Details Patient Name: Date of Service: Reginald Ayers 07/06/2019 2:00 PM Medical Record [id number] Patient Account Number: [id number] Date of Birth/Sex: Treating RN: 02-Dec-1950 (68 y.o. Reginald Ayers Primary Care Reginald Ayers: Reginald Ayers Other Clinician: Referring Reginald Ayers: Treating Reginald Ayers/Extender:Reginald Ayers: 173 Visit Information History Since Last Visit Added or deleted any medications: No Patient Arrived: Reginald Ayers Any new allergies or adverse reactions: No Arrival Time: 13:46 Had a fall or experienced change in Yes Accompanied By: self activities of daily living that may affect Transfer Assistance: None risk of falls: Patient Identification Verified: Yes Signs or symptoms of abuse/neglect since last No Secondary Verification Process Yes visito Completed: Hospitalized since last visit: No Patient Requires Transmission- No Implantable device outside of the clinic excluding No Based Precautions: cellular tissue based products placed in the center Patient Has Alerts: Yes since last visit: Patient Alerts: Patient on Blood Has Dressing in Place as Prescribed: Yes Thinner Pain Present Now: No Electronic Signature(s) Signed: 07/07/2019 5:20:59 PM By: Reginald Ayers Entered By: Reginald Ayers on 07/06/2019 13:50:00 -------------------------------------------------------------------------------- Clinic Level of Care Assessment Details Patient Name: Date of Service: Reginald Ayers 07/06/2019 2:00 PM Medical Record [id number] Patient Account Number: [id number] Date of Birth/Sex: Treating RN: 1950-11-20 (68 y.o. Reginald Ayers Primary Care Reginald Ayers: Reginald Ayers Other Clinician: Referring Reginald Ayers: Treating Emmanuelle Hibbitts/Extender:Reginald Ayers: 173 Clinic Level of Care Assessment  Items TOOL 4 Quantity Score X - Use when only an EandM is performed on FOLLOW-UP visit 1 0 ASSESSMENTS - Nursing Assessment / Reassessment X - Reassessment of Co-morbidities (includes updates in patient status) 1 10 X - Reassessment of Adherence to Ayers Plan 1 5 ASSESSMENTS - Wound and Skin Assessment / Reassessment []  - Simple Wound Assessment / Reassessment - one wound 0 X - Complex Wound Assessment / Reassessment - multiple wounds 2 5 X - Dermatologic / Skin Assessment (not related to wound area) 1 10 ASSESSMENTS - Focused Assessment X - Circumferential Edema Measurements - multi extremities 1 5 X - Nutritional Assessment / Counseling / Intervention 1 10 []  - Lower Extremity Assessment (monofilament, tuning fork, pulses) 0 []  - Peripheral Arterial Disease Assessment (using hand held doppler) 0 ASSESSMENTS - Ostomy and/or Continence Assessment and Care []  - Incontinence Assessment and Management 0 []  - Ostomy Care Assessment and Management (repouching, etc.) 0 PROCESS - Coordination of Care []  - Simple Patient / Family Education for ongoing care 0 X - Complex (extensive) Patient / Family Education for ongoing care 1 20 X - Staff obtains , Records, Test Results / Process Orders 1 10 []  - Staff telephones HHA, Nursing Homes / Clarify orders / etc 0 []  - Routine Transfer to another Facility (non-emergent condition) 0 []  - Routine Hospital Admission (non-emergent condition) 0 []  - New Admissions / / Ordering NPWT, Apligraf, etc. 0 []  - Emergency Hospital Admission (emergent condition) 0 []  - Simple Discharge Coordination 0 X - Complex (extensive) Discharge Coordination 1 15 PROCESS - Special Needs []  - Pediatric / Minor Patient Management 0 []  - Isolation Patient Management 0 []  - Hearing / Language / Visual special needs 0 []  - Assessment of Community assistance (transportation, D/C planning, etc.) 0 []  - Additional assistance / Altered mentation  0 []  - Support Surface(s) Assessment (bed, cushion, seat, etc.) 0 INTERVENTIONS - Wound Cleansing / Measurement []  - Simple Wound Cleansing - one wound 0 X -  Complex Wound Cleansing - multiple wounds 2 5 X - Wound Imaging (photographs - any number of wounds) 1 5  - Wound Tracing (instead of photographs) 0  - Simple Wound Measurement - one wound 0 X - Complex Wound Measurement - multiple wounds 2 5 INTERVENTIONS - Wound Dressings  - Small Wound Dressing one or multiple wounds 0 X - Medium Wound Dressing one or multiple wounds 1 15  - Large Wound Dressing one or multiple wounds 0  - Application of Medications - topical 0  - Application of Medications - injection 0 INTERVENTIONS - Miscellaneous  - External ear exam 0  - Specimen Collection (cultures, biopsies, blood, body fluids, etc.) 0  - Specimen(s) / Culture(s) sent or taken to Lab for analysis 0  - Patient Transfer (multiple staff / Nurse, adult / Similar devices) 0  - Simple Staple / Suture removal (25 or less) 0  - Complex Staple / Suture removal (26 or more) 0  - Hypo / Hyperglycemic Management (close monitor of Blood Glucose) 0  - Ankle / Brachial Index (ABI) - do not check if billed separately 0 X - Vital Signs 1 5 Has the patient been seen at the hospital within the last three years: Yes Total Score: 140 Level Of Care: New/Established - Level 4 Electronic Signature(s) Signed: 07/06/2019 5:17:30 PM By: Reginald Ayers Entered By: Reginald Ayers on 07/06/2019 14:44:45 -------------------------------------------------------------------------------- Encounter Discharge Information Details Patient Name: Date of Service: Reginald Ayers. 07/06/2019 2:00 PM Medical Record [id number] Patient Account Number: [id number] Date of Birth/Sex: Treating RN: 08/14/1951 (68 y.o. Reginald Ayers Primary Care Cederick Broadnax: Reginald Ayers Other Clinician: Referring Jirah Rider: Treating  Genecis Veley/Extender:Reginald Ayers: [phone] Encounter Discharge Information Items Discharge Condition: Stable Ambulatory Status: Cane Discharge Destination: Home Transportation: Other Accompanied By: self Schedule Follow-up Appointment: Yes Clinical Summary of Care: Patient Declined Electronic Signature(s) Signed: 07/07/2019 5:20:59 PM By: Reginald Ayers Entered By: Reginald Ayers on 07/06/2019 14:50:47 -------------------------------------------------------------------------------- Lower Extremity Assessment Details Patient Name: Date of Service: TOSH, GLAZE 07/06/2019 2:00 PM Medical Record [id number] Patient Account Number: [id number] Date of Birth/Sex: Treating RN: 1951/06/25 (68 y.o. Reginald Ayers Primary Care Ionia Schey: Reginald Ayers Other Clinician: Referring Ely Ballen: Treating Chrishon Martino/Extender:Reginald Ayers: 173 Edema Assessment Assessed: [Left: No] [Ayers: No] Edema: [Left: N] [Ayers: o] Calf Left: Ayers: Point of Measurement: 30 cm From Medial Instep cm 29.2 cm Ankle Left: Ayers: Point of Measurement: 10 cm From Medial Instep cm 19 cm Vascular Assessment Pulses: Dorsalis Pedis Palpable: [Ayers:No] Electronic Signature(s) Signed: 07/07/2019 5:20:59 PM By: Reginald Ayers Entered By: Reginald Ayers on 07/06/2019 13:56:10 -------------------------------------------------------------------------------- Multi-Disciplinary Care Plan Details Patient Name: Date of Service: Reginald Ayers. 07/06/2019 2:00 PM Medical Record [id number] Patient Account Number: [id number] Date of Birth/Sex: Treating RN: Feb 01, 1951 (68 y.o. Reginald Ayers Primary Care Tyge Somers: Reginald Ayers Other Clinician: Referring Kapena Hamme: Treating Dale Strausser/Extender:Reginald Ayers: [phone] Active Inactive Nutrition Nursing Diagnoses: Potential for  alteratiion in Nutrition/Potential for imbalanced nutrition Goals: Patient/caregiver agrees to and verbalizes understanding of need to obtain nutritional consultation Date Initiated: 03/09/2019 Target Resolution Date: 07/21/2019 Goal Status: Active Interventions: Provide education on nutrition Ayers Activities: Education provided on Nutrition : 06/01/2019 Patient referred to Primary Care Physician for further nutritional evaluation : 03/09/2019 Notes: Electronic Signature(s) Signed: 07/06/2019 5:17:30 PM By: Reginald Ayers Entered By: Reginald Ayers on 07/06/2019 14:04:10 -------------------------------------------------------------------------------- Pain Assessment Details Patient Name: Date of Service: Reginald Ayers.  07/06/2019 2:00 PM Medical Record [id number] Patient Account Number: [id number] Date of Birth/Sex: Treating RN: 1950/08/30 (68 y.o. Reginald RightM) Dwiggins, Shannon Primary Care Gerik Coberly: Reginald ApoPolite, Ronald D Other Clinician: Referring Dajuan Turnley: Treating Annalynne Ibanez/Extender:Robson, Almira CoasterMichael Polite, Chalmers Guestonald D Weeks in [place]: [phone] Active Problems Location of Pain Severity and Description of Pain Patient Has Paino No Site Locations Pain Management and Medication Current Pain Management: Electronic Signature(s) Signed: 07/07/2019 5:20:59 PM By: Reginald Mylarwiggins, Shannon Entered By: Reginald Mylarwiggins, Shannon on 07/06/2019 13:51:54 -------------------------------------------------------------------------------- Patient/Caregiver Education Details Patient Name: Reginald GalaUSS, Johari H. 11/12/2020andnbsp2:00 Date of Service: PM Medical Record [id number] Number: Patient Account Number: [id number] Treating RN: 1950/08/30 (68 y.o. Reginald Stalleaton, Bobbi Date of Birth/Gender: M) Other Clinician: Primary Care Physician: Reginald ApoPolite, Ronald D Treating Baltazar Najjarobson, Michael Referring Physician: Physician/Extender: Renee RamusPolite, Ronald D Weeks in [place]: [phone] Education Assessment Education Provided To: Patient Education  Topics Provided Nutrition: Handouts: Nutrition Methods: Explain/Verbal, Printed Responses: Reinforcements needed Electronic Signature(s) Signed: 07/06/2019 5:17:30 PM By: Reginald Stalleaton, Bobbi Entered By: Reginald Stalleaton, Bobbi on 07/06/2019 14:04:30 -------------------------------------------------------------------------------- Wound Assessment Details Patient Name: Date of Service: Reginald GalaRUSS, Blythe H. 07/06/2019 2:00 PM Medical Record [id number] Patient Account Number: [id number] Date of Birth/Sex: Treating RN: 1950/08/30 (68 y.o. Reginald RightM) Dwiggins, Shannon Primary Care Souleymane Saiki: Reginald ApoPolite, Ronald D Other Clinician: Referring Marycarmen Hagey: Treating Lonzell Dorris/Extender:Robson, Almira CoasterMichael Polite, Chalmers Guestonald D Weeks in [place]: 173 Wound Status Wound Number: 3 Primary Trauma, Other Etiology: Wound Location: Ayers Foot - Dorsal Wound Open Wounding Event: Blister Status: Date Acquired: 07/08/2016 Comorbid Chronic sinus problems/congestion, Chronic Weeks Of [place]: 155 History: Obstructive Pulmonary Disease (COPD), Clustered Wound: No Coronary Artery Disease, Hypertension, Peripheral Arterial Disease, Type II Diabetes, Received Chemotherapy Wound Measurements Length: (cm) 0 % Reducti Width: (cm) 0 % Reducti Depth: (cm) 0 Epithelia Area: (cm) 0 Tunnelin Volume: (cm) 0 Undermin Wound Description Classification: Full Thickness With Exposed Support Foul Odo Structures Slough/F Wound Flat and Intact Margin: Exudate None Present Amount: Wound Bed Granulation Amount: None Present (0%) Necrotic Amount: None Present (0%) Fascia E Fat Laye Tendon E Muscle E Joint Ex Bone Exp Electronic Signature(s) Signed: 07/07/2019 5:20:59 PM By: Reginald Mylarwiggins, Shannon Entered By: Reginald Mylarwiggins, Shannon on 11/12 r After Cleansing: No ibrino No Exposed Structure xposed: No r (Subcutaneous Tissue) Exposed: No xposed: No xposed: No posed: No osed: No /2020 13:57:39 on in Area: 100% on in Volume: 100% lization:  Large (67-100%) g: No ing: No -------------------------------------------------------------------------------- Wound Assessment Details Patient Name: Date of Service: Reginald GalaRUSS, Brentin H. 07/06/2019 2:00 PM Medical Record [id number] Patient Account Number: [id number] Date of Birth/Sex: Treating RN: 1950/08/30 (68 y.o. Reginald RightM) Dwiggins, Shannon Primary Care Marcia Lepera: Reginald ApoPolite, Ronald D Other Clinician: Referring Nazir Hacker: Treating Manpreet Strey/Extender:Robson, Almira CoasterMichael Polite, Chalmers Guestonald D Weeks in [place]: 173 Wound Status Wound Number: [id number] Primary Diabetic Wound/Ulcer of the Lower Extremity Etiology: Wound Location: Ayers Lower Leg - Anterior Wound Open Wounding Event: Gradually Appeared Status: Date Acquired: 04/21/2018 Comorbid Chronic sinus problems/congestion, Chronic Weeks Of A[place]: 61 History: Obstructive Pulmonary Disease (COPD), Clustered Wound: No Coronary Artery Disease, Hypertension, Peripheral Arterial Disease, Type II Diabetes, Received Chemotherapy Wound Measurements Length: (cm) 4.1 Width: (cm) 4 Depth: (cm) 0.1 Area: (cm) 12.881 Volume: (cm) 1.288 Wound Description Classification: Grade 2 Wound Margin: Flat and Intact Exudate Amount: Medium Exudate Type: Serosanguineous Exudate Color: red, brown Wound Bed Granulation Amount: Medium (34-66%) Granulation Quality: Red Necrotic Amount: Medium (34-66%) Necrotic Quality: Adherent Slough After Cleansing: No rino Yes Exposed Structure osed: No (Subcutaneous Tissue) Exposed: Yes osed: No osed: No sed: No ed: No % Reduction in Area: -1267.4% % Reduction in Volume: -1270.2% Epithelialization: None Tunneling:  No Undermining: No Foul Odor Slough/Fib Fascia Exp Fat Layer Tendon Exp Muscle Exp Joint Expo Bone Expos Ayers Notes Wound #5R (A[place], Anterior Lower Leg) 1. Cleanse With Wound Cleanser 2. Periwound Care Barrier cream 3. Primary Dressing Applied Collagen Hydrogel or K-Y Jelly 4.  Secondary Dressing Foam Border Dressing Electronic Signature(s) Signed: 07/07/2019 5:20:59 PM By: Kela Millin Entered By: Kela Millin on 07/06/2019 13:58:10 -------------------------------------------------------------------------------- Vitals Details Patient Name: Date of Service: [name] 07/06/2019 2:00 PM Medical Record [id number] Patient Account Number: [id number] Date of Birth/Sex: Treating RN: [birth year] (68 y.o. [name] Primary Care Jester Klingberg: Kandice Hams Other Clinician: Referring Chelsae Zanella: Treating Jlen Wintle/Extender:Robson, Alice Reichert, Lowella Dandy in [place]: 173 Vital Signs Time Taken: 13:50 Temperature (F): 98.2 Height (in): 61 Pulse (bpm): 88 Weight (lbs): 122 Respiratory Rate (breaths/min): 18 Body Mass Index (BMI): 23 Blood Pressure (mmHg): 155/55 Reference Range: 80 - 120 mg / dl Electronic Signature(s) Signed: 07/07/2019 5:20:59 PM By: Kela Millin Entered By: Kela Millin on 07/06/2019 13:51:34

## 2019-07-19 ENCOUNTER — Ambulatory Visit (INDEPENDENT_AMBULATORY_CARE_PROVIDER_SITE_OTHER): Payer: Medicare Other | Admitting: Cardiovascular Disease

## 2019-07-19 ENCOUNTER — Other Ambulatory Visit: Payer: Self-pay

## 2019-07-19 ENCOUNTER — Encounter: Payer: Self-pay | Admitting: Cardiovascular Disease

## 2019-07-19 VITALS — BP 140/76 | HR 80 | Ht 61.0 in | Wt 128.0 lb

## 2019-07-19 DIAGNOSIS — I739 Peripheral vascular disease, unspecified: Secondary | ICD-10-CM | POA: Diagnosis not present

## 2019-07-19 DIAGNOSIS — I6523 Occlusion and stenosis of bilateral carotid arteries: Secondary | ICD-10-CM | POA: Diagnosis not present

## 2019-07-19 DIAGNOSIS — Z951 Presence of aortocoronary bypass graft: Secondary | ICD-10-CM | POA: Diagnosis not present

## 2019-07-19 DIAGNOSIS — E782 Mixed hyperlipidemia: Secondary | ICD-10-CM

## 2019-07-19 DIAGNOSIS — I10 Essential (primary) hypertension: Secondary | ICD-10-CM | POA: Diagnosis not present

## 2019-07-19 NOTE — Assessment & Plan Note (Addendum)
History of essential hypertension with blood pressure measured today at 180/76 with follow-up blood pressure at the end of the office visit of 140/76.Marland Kitchen  He is only on metoprolol.

## 2019-07-19 NOTE — Assessment & Plan Note (Signed)
History of hyperlipidemia on statin therapy with lipid profile performed 03/27/2019 revealing total cholesterol 120, LDL 41 and HDL of 34.

## 2019-07-19 NOTE — Assessment & Plan Note (Signed)
History of carotid artery disease by duplex ultrasound 01/06/2018 with an occluded left and moderate right ICA stenosis.  This is being followed by Dr. Trula Slade.

## 2019-07-19 NOTE — Assessment & Plan Note (Signed)
History of peripheral arterial disease status post left common iliac artery stenting by myself December 2003 and right SFA intervention April 2008.  He had critical limb ischemia and underwent right SFA intervention by Dr. Trula Slade opening up a previously placed stent with a Viabahn covered stent.  He now follows his peripheral vasculature as well as carotid arteries.  The wound on his foot is apparently healed but he has a pretibial wound which is apparently gotten worse in his appointment with Dr. Trula Slade next week.

## 2019-07-19 NOTE — Patient Instructions (Signed)
Medication Instructions:  No changes *If you need a refill on your cardiac medications before your next appointment, please call your pharmacy*  Lab Work: None ordered If you have labs (blood work) drawn today and your tests are completely normal, you will receive your results only by: . MyChart Message (if you have MyChart) OR . A paper copy in the mail If you have any lab test that is abnormal or we need to change your treatment, we will call you to review the results.  Testing/Procedures: None ordered  Follow-Up: At [facility], you and your health needs are our priority.  As part of our continuing mission to provide you with exceptional heart care, we have created designated Provider Care Teams.  These Care Teams include your primary Cardiologist (physician) and Advanced Practice Providers (APPs -  Physician Assistants and Nurse Practitioners) who all work together to provide you with the care you need, when you need it.  Your next appointment:   12 month(s)  The format for your next appointment:   In Person  Provider:   Jonathan Berry, MD    

## 2019-07-19 NOTE — Progress Notes (Signed)
07/19/2019 Reginald Ayers   30-Nov-1950  [id number]  Primary Physician Renford DillsPolite, Ronald, MD Primary Cardiologist: Runell GessJonathan J Brianne Maina MD FACP, [place]ACC, [place]HA, [place]  HPI:  Reginald Ayers is a 68 y.o.   thin-appearing, single Caucasian male with no children whom I last saw 11 months ago. He has a history of CAD and PAD.I last saw him in the office 12/28/2017. I stented his proximal left common iliac artery in December 2003 and right SFA in April of 2008. He does have significant infrapopliteal disease bilaterally and known occluded left SFA. He also has an occluded right SFA by Dopplers in our office last September was earlier this month.. His ABIs were 0.48 on the right and 0.52 on the left, but he really denies claudication. He has known carotid disease with occluded left internal carotid artery and moderate right ICA stenosis, which has remained ultrasonographically stable. He is neurologically asymptomatic. His other problems include diabetes, hypertension, hyperlipidemia, and long-term tobacco abuse, smoking one-half pack per day. He had coronary artery bypass grafting by Dr. Andrey SpearmanSteve Hendrickson in February of 2010 with LIMA to his LAD, left radial to the first obtuse marginal branch, vein to ramus intermedius, and a sequential vein to an acute marginal and distal right coronary artery. Myoview performed in May of 2010 was nonischemic and showed apical scar consistent with his wall motion abnormality and his known occluded LAD.Dr. Earl Galasborne follows his lipid profile.  He had recurrent critical limb ischemia and had intervention by Dr. Myra GianottiBrabham in December of last year opening up an occluded right SFA stent that I have placed 11 years ago with aViabahn.covered stent. He has recently complained of recurrent nitro responsive chest pain. Did have a Myoview stress test performed in 2015 which was low risk.    He did have a Myoview performed 01/05/2018 which was low risk.  He apparently does have wounds on his  right leg followed by Dr. Leanord Hawkingobson and he has an upcoming appointment with Dr. Myra GianottiBrabham.  Since I saw him a year ago he is remained stable.  He still has a pretibial wound followed by Dr. Myra GianottiBrabham.  He has complained of some chest burning which gets better with having a bowel movement.  Suspect this is more gastrointestinal than anything else.   Current Meds  Medication Sig  . albuterol (PROVENTIL) (2.5 MG/3ML) 0.083% nebulizer solution U 3 ML VIA NEB TID PRN  . B Complex Vitamins (B COMPLEX PO) Take 1 tablet by mouth 2 (two) times a week.   . Benzocaine (BOIL-EASE EX) Apply 1 application topically daily as needed (BOIL).  Marland Kitchen. clopidogrel (PLAVIX) 75 MG tablet Take 75 mg by mouth daily.   . Cyanocobalamin (VITAMIN B-12 IJ) Inject 1 Dose as directed every 30 (thirty) days.   . empagliflozin (JARDIANCE) 10 MG TABS tablet Take 10 mg by mouth daily.  . furosemide (LASIX) 40 MG tablet Take 40 mg by mouth daily as needed for edema.  Marland Kitchen. glipiZIDE (GLUCOTROL XL) 10 MG 24 hr tablet Take 10 mg by mouth 2 (two) times daily.   . Homeopathic Products (LEG CRAMP RELIEF SL) Place 2 tablets under the tongue at bedtime as needed (leg cramps).   Marland Kitchen. ibuprofen (ADVIL,MOTRIN) 200 MG tablet Take 800 mg by mouth every 8 (eight) hours as needed for moderate pain.  . IRON PO Take 1 tablet by mouth 4 (four) times a week.   . metFORMIN (GLUCOPHAGE) 500 MG tablet Take 1,000 mg by mouth 2 (two) times daily  with a meal.   . metoprolol succinate (TOPROL-XL) 25 MG 24 hr tablet Take 1 tablet (25 mg total) by mouth daily. PT OVERDUE FOR OV PLEASE CALL FOR APPT #1 attempt  . NITROSTAT 0.4 MG SL tablet Take 0.4 mg by mouth every 5 (five) minutes as needed for chest pain.   . Polyvinyl Alcohol-Povidone (MURINE TEARS FOR DRY EYES OP) Apply 1 drop to eye 3 (three) times daily as needed (dry eyes).  . potassium chloride (K-DUR) 10 MEQ tablet Take 10 mEq by mouth daily.  . Pseudoeph-Doxylamine-DM-APAP (NYQUIL PO) Take 1 Dose by mouth at  bedtime.   . simvastatin (ZOCOR) 40 MG tablet Take 40 mg by mouth every evening.  Marland Kitchen SPIRIVA HANDIHALER 18 MCG inhalation capsule Place 18 mcg into inhaler and inhale daily at 8 pm.   . traMADol (ULTRAM) 50 MG tablet Take 50 mg by mouth 3 (three) times daily as needed for severe pain.      Allergies  Allergen Reactions  . Actos [Pioglitazone] Shortness Of Breath    Leg swelling   . Lisinopril Cough    Social History   Socioeconomic History  . Marital status: Single    Spouse name: Not on file  . Number of children: Not on file  . Years of education: Not on file  . Highest education level: Not on file  Occupational History  . Occupation: Retired Research officer, trade union  . Financial resource strain: Not on file  . Food insecurity    Worry: Not on file    Inability: Not on file  . Transportation needs    Medical: Not on file    Non-medical: Not on file  Tobacco Use  . Smoking status: Light Tobacco Smoker    Packs/day: 0.00    Types: Cigarettes    Last attempt to quit: 08/07/2016    Years since quitting: 2.9  . Smokeless tobacco: Current User    Types: Chew  Substance and Sexual Activity  . Alcohol use: No    Alcohol/week: 0.0 standard drinks    Comment: Weekend drinker, quit 2000. NIGHTLY nyquill  . Drug use: No  . Sexual activity: Not on file  Lifestyle  . Physical activity    Days per week: Not on file    Minutes per session: Not on file  . Stress: Not on file  Relationships  . Social Herbalist on phone: Not on file    Gets together: Not on file    Attends religious service: Not on file    Active member of club or organization: Not on file    Attends meetings of clubs or organizations: Not on file    Relationship status: Not on file  . Intimate partner violence    Fear of current or ex partner: Not on file    Emotionally abused: Not on file    Physically abused: Not on file    Forced sexual activity: Not on file  Other Topics Concern  . Not on file   Social History Narrative   Lives in [place]. Has 2 roommates, they both smoke. Has 9 dogs.     Review of Systems: General: negative for chills, fever, night sweats or weight changes.  Cardiovascular: negative for chest pain, dyspnea on exertion, edema, orthopnea, palpitations, paroxysmal nocturnal dyspnea or shortness of breath Dermatological: negative for rash Respiratory: negative for cough or wheezing Urologic: negative for hematuria Abdominal: negative for nausea, vomiting, diarrhea, bright red blood per rectum, melena, or  hematemesis Neurologic: negative for visual changes, syncope, or dizziness All other systems reviewed and are otherwise negative except as noted above.    Blood pressure (!) 180/76, pulse 80, height 5\' 1"  (1.549 m), weight 128 lb (58.1 kg), SpO2 98 %.  General appearance: alert and no distress Neck: no adenopathy, no carotid bruit, no JVD, supple, symmetrical, trachea midline and thyroid not enlarged, symmetric, no tenderness/mass/nodules Lungs: clear to auscultation bilaterally Heart: regular rate and rhythm, S1, S2 normal, no murmur, click, rub or gallop Extremities: extremities normal, atraumatic, no cyanosis or edema Pulses: 2+ and symmetric Diminished pedal pulses Skin: Ischemic ulcer right pretibial area Neurologic: Alert and oriented X 3, normal strength and tone. Normal symmetric reflexes. Normal coordination and gait  EKG sinus rhythm at 80 with right bundle branch block and septal Q waves.  I personally reviewed this EKG.  ASSESSMENT AND PLAN:   Hx of CABG Feb 2010 History of CAD status post CABG by Dr. Mar 2010 February 2010 with a LIMA to the LAD, left radial to the 1st obtuse marginal branch, vein to the ramus intermedius, sequential vein to an acute marginal and distal right coronary artery.  He had a negative Myoview May 2010 as well as in 2015 and more recently 01/05/2018.  He had apical scar without ischemia.  He does complain of some  chest burning with which gets better with having a bowel movement.  Suspect this is gastrointestinal in nature.  Peripheral arterial disease (HCC) History of peripheral arterial disease status post left common iliac artery stenting by myself December 2003 and right SFA intervention April 2008.  He had critical limb ischemia and underwent right SFA intervention by Dr. May 2008 opening up a previously placed stent with a Viabahn covered stent.  He now follows his peripheral vasculature as well as carotid arteries.  The wound on his foot is apparently healed but he has a pretibial wound which is apparently gotten worse in his appointment with Dr. Myra Gianotti next week.  Carotid artery disease (HCC) History of carotid artery disease by duplex ultrasound 01/06/2018 with an occluded left and moderate right ICA stenosis.  This is being followed by Dr. 01/08/2018.  Essential hypertension History of essential hypertension with blood pressure measured today at 180/76.  He is only on metoprolol.  Hyperlipidemia History of hyperlipidemia on statin therapy with lipid profile performed 03/27/2019 revealing total cholesterol 120, LDL 41 and HDL of 34.      05/27/2019 MD FACP,FACC,FAHA, [facility] 07/19/2019 2:29 PM

## 2019-07-19 NOTE — Assessment & Plan Note (Signed)
History of CAD status post CABG by Dr. Roxan Hockey February 2010 with a LIMA to the LAD, left radial to the 1st obtuse marginal branch, vein to the ramus intermedius, sequential vein to an acute marginal and distal right coronary artery.  He had a negative Myoview May 2010 as well as in 2015 and more recently 01/05/2018.  He had apical scar without ischemia.  He does complain of some chest burning with which gets better with having a bowel movement.  Suspect this is gastrointestinal in nature.

## 2019-07-27 ENCOUNTER — Other Ambulatory Visit: Payer: Self-pay

## 2019-07-27 ENCOUNTER — Encounter (HOSPITAL_BASED_OUTPATIENT_CLINIC_OR_DEPARTMENT_OTHER): Payer: Medicare Other | Attending: Internal Medicine | Admitting: Internal Medicine

## 2019-07-27 DIAGNOSIS — J449 Chronic obstructive pulmonary disease, unspecified: Secondary | ICD-10-CM | POA: Insufficient documentation

## 2019-07-27 DIAGNOSIS — E1151 Type 2 diabetes mellitus with diabetic peripheral angiopathy without gangrene: Secondary | ICD-10-CM | POA: Insufficient documentation

## 2019-07-27 DIAGNOSIS — L97213 Non-pressure chronic ulcer of right calf with necrosis of muscle: Secondary | ICD-10-CM | POA: Insufficient documentation

## 2019-07-27 DIAGNOSIS — I2510 Atherosclerotic heart disease of native coronary artery without angina pectoris: Secondary | ICD-10-CM | POA: Insufficient documentation

## 2019-07-27 DIAGNOSIS — I10 Essential (primary) hypertension: Secondary | ICD-10-CM | POA: Insufficient documentation

## 2019-07-27 DIAGNOSIS — L97512 Non-pressure chronic ulcer of other part of right foot with fat layer exposed: Secondary | ICD-10-CM | POA: Insufficient documentation

## 2019-07-27 DIAGNOSIS — E11621 Type 2 diabetes mellitus with foot ulcer: Secondary | ICD-10-CM | POA: Diagnosis not present

## 2019-07-27 DIAGNOSIS — L89322 Pressure ulcer of left buttock, stage 2: Secondary | ICD-10-CM | POA: Diagnosis not present

## 2019-07-27 DIAGNOSIS — L97312 Non-pressure chronic ulcer of right ankle with fat layer exposed: Secondary | ICD-10-CM | POA: Diagnosis not present

## 2019-07-27 DIAGNOSIS — E11622 Type 2 diabetes mellitus with other skin ulcer: Secondary | ICD-10-CM | POA: Insufficient documentation

## 2019-07-27 DIAGNOSIS — L97811 Non-pressure chronic ulcer of other part of right lower leg limited to breakdown of skin: Secondary | ICD-10-CM | POA: Diagnosis not present

## 2019-07-28 ENCOUNTER — Other Ambulatory Visit: Payer: Self-pay

## 2019-07-28 DIAGNOSIS — I6523 Occlusion and stenosis of bilateral carotid arteries: Secondary | ICD-10-CM

## 2019-07-28 DIAGNOSIS — I779 Disorder of arteries and arterioles, unspecified: Secondary | ICD-10-CM

## 2019-07-28 NOTE — Addendum Note (Signed)
Addended by: York Cerise C on: 07/28/2019 02:49 PM   Modules accepted: Orders

## 2019-07-28 NOTE — Progress Notes (Signed)
Reginald, Ayers ([id number]) Visit Report for 07/27/2019 Debridement Details Patient Name: Date of Service: Reginald Ayers, Reginald Ayers 07/27/2019 2:00 PM Medical Record [id number] Patient Account Number: [id number] Date of Birth/Sex: Treating RN: 12/31/1950 (68 y.o. M) Primary Care Provider: Katy Apo Other Clinician: Referring Provider: Treating Provider/Extender:Myranda Pavone, [name], [name]: 176 Debridement Performed for Wound #5R Right,Anterior Lower Leg Assessment: Performed By: Physician Maxwell Caul., MD Debridement Type: Debridement Severity of Tissue Pre Limited to breakdown of skin Debridement: Level of Consciousness (Pre- Awake and Alert procedure): Pre-procedure Verification/Time Out Taken: Yes - 15:45 Start Time: 15:46 Pain Control: Lidocaine 4% Topical Solution Total Area Debrided (L x W): 3 (cm) x 3 (cm) = 9 (cm) Tissue and other material Non-Viable, Slough, Slough debrided: Level: Non-Viable Tissue Debridement Description: Selective/Open Wound Instrument: Curette Bleeding: Minimum Hemostasis Achieved: Pressure End Time: 15:49 Procedural Pain: 0 Post Procedural Pain: 0 Response to Treatment: Procedure was tolerated well Level of Consciousness Awake and Alert (Post-procedure): Post Debridement Measurements of Total Wound Length: (cm) 3 Width: (cm) 3 Depth: (cm) 0.1 Volume: (cm) 0.707 Character of Wound/Ulcer Post Requires Further Debridement Debridement: Severity of Tissue Post Debridement: Limited to breakdown of skin Post Procedure Diagnosis Same as Pre-procedure Electronic Signature(s) Signed: 07/28/2019 7:57:15 AM By: Baltazar Najjar MD Entered By: Baltazar Najjar on 07/27/2019 16:42:11 -------------------------------------------------------------------------------- HPI Details Patient Name: Date of Service: Reginald Ayers 07/27/2019 2:00 PM Medical Record [id number] Patient Account Number:  [id number] Date of Birth/Sex: Treating RN: Jan 20, 1951 (68 y.o. M) Primary Care Provider: Katy Apo Other Clinician: Referring Provider: Treating Provider/Extender:Keegen Heffern, [name], [name]: 176 History of Present Illness HPI Description: 03/06/16; this is a patient who is a type II diabetic with a recent hemoglobin A1c of 7.6. He is referred from Dr. Allyson Sabal for aggressive wound care on a wound on his right anterior leg. He has known severe PAD and has an extensive interventional history. His left common iliac artery was stented in December 2003 and his right superficial femoral artery in April 2008. He has significant infrapopliteal disease bilaterally and has known occluded left SFA. His ABIs from September 2016 showed a ABI of 0.48 on the right and 0.52 on the left although he does not really describe claudication. He also has known carotid artery disease, coronary artery disease and is status post CABG in 2010. He tells me he smokes 3 cigarettes a day. The patient tells me his at the wound on the right anterior leg for about 6 months. He has a smaller area on the left medial leg that has been present for one month. He's been to see his primary physician Dr. polite and Dr. Allyson Sabal I am not clear what he is been using on this. It is felt that his ulcer is an ischemic ulcer on the pretibial region. It is noted that if we are unable to heal this it was felt he would need repeat angiography and potential percutaneous revascularization. 03/13/16; the area on the left medial leg is healed over. His ischemic wound on the right anterior leg is debridement of a lot of nonviable subcutaneous tissue we've been using Santyl 03/20/16; he has no open area on the left medial leg. The wound on the right anterior leg looks ischemic. A lot of nonviable tissue. Our intake nurse reported that he had a lot of dried blood clots around the wound presumably from last week's debridement.  There is no evidence of infection 04/16/16; the area on the right  anterior leg is an ischemic wound. He is been back to see Dr. Allyson Sabal who states he is not a candidate for revascularization. He has a large area of occlusion from his groin down which cannot be revascularized. Since Dr. Allyson Sabal is aggressive with this type of situation I can only surmised that that's the correct decision 05/01/16 the area on the right leg is an ischemic wound. He has not a candidate for revascularization. He has not smoked in 48 hours. Has been using Santyl wound size is not any smaller however this surface looks somewhat better 05/08/16; ischemic wound on the right anterior leg. He is not a candidate for revascularization. He continues with nonsmoking for the last week. I use Prisma last week although that probably wasn't a good decision, covered in a difficult surface eschar today requiring debridement 05/15/16; patient with ischemic wound on his right anterior leg. He is not a candidate for revascularization. Complains of a lot of pain this week/burning over the wound area making it difficult for him to sleep 05/21/16; patient states less pain this week. The wound had circumferential fibrinous slough that I removed along with some nonviable subcutaneous tissue. We used Hydrofera Blue last week 05/28/16; patient states he has not had any pain. Some minor reduction in the area. Still using Hydrofera Blue 06/04/16; no pain still using Hydrofera Blue 06/11/16 no complaints still using Hydrofera Blue 06/18/16; I changed him to Endoform last week. 06/25/16; wound still appears healthy. Minor debridement this week still using Endoform 07/02/16; the wound bed certainly appears healthier. Rim of epithelialization superiorly was there last week. It certainly has not progressed any further. Using Endoform 07/09/16 patient is here for a review of the wound on the right anterior leg. He has a history of severe and prolonged PAD  followed by Dr. Allyson Sabal in the setting of type 2 diabetes. I have been using Endoform for the last 3 or 4 weeks. He has a rim of epithelialization superiorly but I have not been able to advance this. He complains of pain but there is no clear infection 07/15/2016 -- the patient was here for a nurse visit but due to the presence of a new wound I was asked to see him for an opinion 07/23/16; the patient was seen last week by Dr. Meyer Russel in my absence. He had a new wound on his right dorsal ankle. Santyl was applied to this new area and also his original wound on the right anterior tibia. He arrives today complaining of a lot of pain 07/30/16; the patient has new wound on his right dorsal ankle from 2 weeks ago. This is as usual covered with a thick eschar. I think this is ischemic and I'm not expecting to be able to heal this. Santyl was applied for but denied by his insurance we will therefore use Medihoney. He only obtained this later today. He has left a wrap on all week with Santyl. 08/06/16 both wounds in the same situation still a gelatinous surface slough. He finds the Medihoney makes him feel pain. He could not afford this Santyl opiate 08/20/16; patient arrives today frustrated about his nonhealing wounds now 2 on his right leg. He arrived here with a wound on the right anterior leg mid aspect and is developed a second area on the right dorsal ankle. I have reviewed Dr. Hazle Coca notes and his visit from August/17. There he makes clear reference to the fact that the vessels were not revascularizable. The patient also describes fairly clear  claudication at rest which makes him sleep with his legs in a dependent position. He has given up smoking as of yesterday 09/03/16; patient is going for a second opinion on his vascular status of vein and vascular next week. We have been using Medihoney to the wounds. He is tolerating his discomfort. 09/17/16; the patient is going to have an angiogram on  January 30. Miraculously I can't really see has had a recent angiogram although he has been to see Dr. Allyson Sabal who does not think here is cerebrovascular rise visible option. He does have stents in both legs that Dr. Allyson Sabal put and I think about 9 years ago. I would like to continue to use Medihoney until we see the results of his angiogram. 10/01/16; the patient had his angiogram. He has stent previously which was dilated and then a stent was placed in the right superficial femoral artery. He had failed balloon angioplasty of the right posterior tibial artery. A further stent was placed in the superficial femoral artery. An angioplasty was also done. He was noted to have severe stenosis versus occlusion of the posterior tibial artery which is the dominant vessel across the foot. Intervention was attempted here but was unsuccessful. If the patient is unable to heal his wounds it was felt he could be brought back for repeat attempt 10/15/16- patient is here for follow-up evaluation of his right lower extremity and right dorsal foot ulcers. He was unable to make last week's appointment secondary to a death in the family. He has been using Iodosorb/Iodoflex dressing changes per home health. He states that his primary care physician follows an A1c every 3 months, with his last A1c being 7.8 which is elevated from the previous. He states he has a scheduled A1c for next month. He also states he has a follow-up appointment with Vein and Vascular in April 10/22/16- Mr. Leider did have an xray of the right foot, revealing no definite evidence of bone destruction to suggest osteomyelitis. This was performed at [facility]. He has been wearing his show with a rolled tongue since the last visit when he leaves the home, otherwise he wears loose house slippers around the house. He is voicing no complaints or concerns since his last visit. He admits to smoking "maybe one a week" 10/29/16- patient is here for  follow-up evaluation of his right lower extremity and right dorsal foot ulcers. He states that he has completely stopped smoking but does admit to dipping tobacco. We have not found out his coverage for both [employer] and Apligraf. He continues to offload this area with his walking shoe otherwise wears a slipper at home. He brought this (and it does appear to allow for relief of pressure to the dorsal part of his foot. 11/05/16; patient is here for follow-up of his right lower extremity and right dorsal foot ulcers. He has been revascularized. He no longer smokes. We have been using Prisma. 11/12/16- Mr. Breit arrives for follow up evaluation of his right lower extremity ulcers. We have been using Prisma. He is planned for an Apligraf application today. 11/19/16- Mr.Campanella arrives for follow-up evaluation of his right lower extremity and right dorsal foot ulcer one week after Apligraf #1 application. He states the home health came out on Monday and change the foam, Kerlix and Coban and and they did not interrupt the Adaptic touch. He has a follow-up appointment with ultrasound on Monday with vein and vascular 11/26/16- he is here for follow-up evaluation. He is s/p Apligraf #  1 application 2 weeks ago. he had an appointment with vascular on Monday for arterial ultrasound. In evaluation of the stent to the right SFA which indicates biphasic waveform proximal to the stent and at the origin of the stent otherwise monophasic flow throughout the stent and at the distal aspect of the stent. An ABI done in that office on Monday was 0.73 (previously 0.47). He has biphasic flow in the right posterior tibial and monophasic flow in the dorsalis pedis. He has a follow-up with Dr. Myra Gianotti on Monday, 4/9 12/03/16- He is here for follow-up evaluation. He did see Dr. Myra Gianotti on Monday and has a scheduled aortogram with lower extremity arteriogram on the 17th. He was not compressed over the last week. His wound culture  came back Oxacillin sensitive staph aureus. He was prescribed Keflex and has been taking that without any voiced complaints. He states that he started taking his Lasix, although he did not contact or have an appointment with Dr. Nehemiah Settle. He does have an appointment in May 12/10/16- he is here for follow-up evaluation. He had an intervention on 4/17 with Dr. Myra Gianotti. According to his dictation 1) the patient has a widely patent right SFA and popliteal stent 2) after the an 80% occlusion of the right PTA was treated with atherectomy and balloon angioplasty with a residual stenosis of less than 5%. The patient is voicing significant improvement and overall feeling; states that he can now sleep in the bed without any symptoms. His completed his Keflex with no adverse reaction. He continues to self-regulate his Lasix, he is not on any potassium supplementation. 12/17/16; Arrives today with weeping edema thru the dorsal foot wound and lower leg. He has not been using his Lasix and did not have compression on his leg. According to the patient he has 40mg  Lasix at home which he takes prn but not in weeks 12/21/16; less edema and no weeping edema. Apligraf #1 12/31/16; both wounds look somewhat better especially the area distally. Apligraf #2 01/14/17; Apligraf #3. Wounds looked improved. Patient saw main and vascular. ABIs on 01/11/17 done in their office showed a patent SFA stent status post atherectomy with angioplasty of the right PTA [12/08/16] biphasic flow on the right 0.95 monophasic flow on the left 0.45. 01/28/17; Apligraf #4. Wounds not particularly better than I remember. 02/11/17; Apligraf #5 the wounds have some improvement. The distal 1 has had some epithelialization proximal one is somewhat smaller. 02/25/17; he arrived in clinic today with a disappointing amount of surface necrotic material. This is especially true on the distal anterior foot/ankle wound. Change primary dressing the Hydrofera Blue  today 03/04/17; some improvement in the proximal wound on the tibia. The distal wound on the dorsal foot/ankle has too much necrotic surface. We have been using Hydrofera Blue although there was apparently some problem with obtaining it through his home health company however the patient reports he now has it 03/11/17; wounds measure slightly smaller. Surface looks questionable although with the improvement in dimensions will likely continue with the [facility] without debridement today 03/18/17; no major change in these wounds. Questionable surface. Debridement today. Using [facility] 03/25/17; the patient has follow-up noninvasive vascular tests with vein and vascular later this month. The wounds himself are not measuring any different however the proximal wound bed looks better with the Iodoflex not much change with the area over the dorsal foot. 04/22/17; patient had repeat angiography on 04/20/17. He had a drug-coated balloon angioplasty of the right superficial femoral artery  balloon angioplasty right common femoral artery. He had stenosis of previously placed stents within the right superficial femoral artery this was treated with drug coated balloon angioplasty. He continues to have single vessel runoff via the peroneal artery He has been using Hydrofera Blue 04/29/17; no major change this week. Wound beds appear healthy. No debridement. He apparently did not tolerate Iodoflex which I was using prior to modification. I'm going to change him to Endo form with hydrogel 05/13/17; wound beds again appear a lot better, paradoxically the wound on the dorsal foot appears to be progressing better than the proximal wound. Using Endoform with hydrogel 06/03/17; doing well. wounds smaller especially the wound on the dorsal foot. using endoform 06/17/17; two-week follow-up everything is going really nicely here. Using Endoform he only has one small open area left on the right dorsal foot/ankle. Also  improved on the right anterior leg. He is retired from smoking since starting with [place] in this clinic 07/01/17; two-week follow-up the area on his right anterior foot/ankle is just about closed. The area superiorly on the lower mid calf as adherent necrotic debris which will need debridement but it is also down in terms of dimensions. We'll use Endoform 07/19/17 on evaluation today patient appears to be doing well with his Current wound care measures and there does not appear to be necrotic tissue which is good news. The Endoform however does seem to be causing some epithelialization although this is loose at the proximal/12 o'clock location and seems to be pulling off with dressing changes. No fevers, chills, nausea, or vomiting noted at this time. He has no significant discomfort. 08/05/17; the patient has 2 small open areas on the proximal foot and a larger wound on the right anterior leg. Change to [facility] Blue last time probably because of the hyper granulation proximally 08/26/17; the patient has 2 small open areas on the proximal foot wound which is closed. The other is covered with a thin surface eschar. There is a similar wound on the right anterior leg. We've been using Hydrofera Blue 09/09/17*; the patient went for angiogram with Dr. Myra Gianotti. Apparently a routine scheduled arterial duplex suggested occlusion of his stent however with the angiography the stent was widely patent. No procedure was necessary. He also apparently has had a long-standing pressure area on the left buttock which dates back to prior to Thanksgiving although he's never really said anything to anybody about this. He is been attempting to treated himself with triple antibiotic cream etc. We've been using silver collagen to the wounds on his right leg and dorsal right foot. These are largely unchanged today 09/23/17; patient's wounds are smaller in both areas and are just about closed. We have been using silver  collagen he shows me an area on his buttock that is in the gluteal fold. This would be an atypical place for a pressure sore like a herpetic lesion although he says it's been there for a long time 2/14/19the patient's wound is totally closed. The area on the dorsal foot is also close to closing. I looked again at the area on his left buttock in the gluteal fold. He tells me that this is been there for 6 months. This more looks more like a pressure ulcer to me than it did when I looked at this 2 weeks ago. I simply put topical antibiotics on this but we are going to have to change to a more formal dressing 10/13/17 on evaluation today patient appears to be doing very  well in regard to his right dorsal foot wound. He has been tolerating the dressing changes without complication. Fortunately there does not appear to be any evidence of infection which is good news. He continues to have a left gluteal ulcer as well as he has been tolerating the Prisma dressing on although this is not significantly smaller it does sound like he's been putting other things on this as well including some kind of frame he in fact this order another cream as well although I'm not exactly sure what this is. It's something that he "researched on the Internet that is supposed to make My wound healed him seven days." At worst his pain is a 2/10 in regard to the left gluteal region but this is mainly with cleansing of the wound. 10/21/17; his right dorsal foot wound is just about closed. The area on the right anterior shin area is closed. He has a left gluteal ulcer which is probably a pressure ulcer. He is been using his own creams that he spot on the Internet. This wound is also better. 11/04/17; this is a patient with severe PAD who is been revascularized by vein and vascular. He had 2 difficult wound areas one on the right anterior shin and the right anterior dorsal foot. More recently he has developed a left gluteal ulcer  which is probably a pressure ulcer although it has some atypical nature to it. His proximal right leg has remained healed. He has a small open area on the right dorsal foot with a nonviable surface 11/18/17; this is a patient with severe PAD who is been revascularized by vein and vascular. The area on the right proximal leg has healed over. He has a very small ischemic wound on the right dorsal foot which was the second wound. He also had a pressure ulcer on his buttock which is healed out as of today 12/02/17; wound on the dorsal foot which I think is largely ischemic. He is upset with me for doing a surface debridement on this last visit however he still has a nonviable surface on this and I think this is largely a blood flow issue 12/16/17; wound on the dorsal foot which I think is largely ischemic. He sees Dr. Crafton Sink ham again next week. We've been using Iodoflex to this not much of a healthy surface. 12/30/17; the patient went back to see vascular surgery. He had noninvasive testing. His ABI on the right was 0.70 compared to 0.60 last time on the left 0.57 compared to 0.44. TBI's were 0.3 on the right 0.29 on the left essentially unchanged from last time. Waveforms were monophasic. He was seen by a provider. Noted nonpalpable pulses. Overall they felt that they had slightly improved bilateral ABIs and right TBI. Her disease bilaterally with all waveforms monophasic. They will follow his lower extremity arterial duplex in 6 months. They're calling the areas on his feet venous stasis ulcers. He does not clearly describe claudication 01/13/18; The patient's wound is slightly larger in width and nonviable surface. We've been using Iodoflex. He has home help changing the dressings. I have been reluctant to do debridement on this since he blames a debridement for reopening his wound actually still think this is arterial even given recent arterial results and evaluation [see above) 01/27/18; using iodoflex.  better surface. 02/10/18; using Iodoflex. Surface is some better. Changed him to silver collagen today 03/02/18 on evaluation today patient continues to show signs of slow but good progress in regard to his  left lower extremity ulcer. He has been tolerating the dressing changes without complication which is good news. He does have somewhat limited/poor vascular flow but nonetheless he does seem to be healing just at a much slower place and what would be otherwise recommended and expected. I do believe the silver collagen dressing change has been of benefit. 03/22/18; slow but good progress in terms of his right anterior foot wound. These are largely ischemic wounds but he has been revascularized. He is continued with silver collagen 04/07/18 on evaluation today patient appears to be doing rather well in regard to the wound on the dorsal surface of his right foot. He's been tolerating the dressing changes without complication. Fortunately there does not appear to be the evidence of infection at this time which is great news. Overall I'm very pleased with how things seem to appear at this point. 04/21/18; dorsal right foot wound secondary to PAD. Wound is making progress by 2 mm. Surface of it is passable. He is been using silver collagen 05/05/18; we received a call from home health this week that the original proximal tibial area had reopened on the right. He now has 2 open areas including the one we've been watching for a long period on the foot. The new open area seems to be right in the same place as the original wound previously was located 05/13/2018; wounds are both actually larger. I debrided these last week both wounds are bigger. The surface looks better I been using Iodoflex. 05/27/2018; the wounds are larger but the surface of them looks better with Iodoflex. He has a follow-up with Dr. Myra Gianotti sometime in November. I tried to move these up we were not able to get an earlier appointment I  will try to text him. 06/16/2018; the patient's wounds are larger but with a generally cleaner surface. He has extensive set of investigations with Dr. Myra Gianotti on November 5. This includes ABIs TBI's and I believe an ultrasound of the stent. 07/04/2018; the patient apparently stated he felt too dizzy to go for his arterial studies on 11/5 therefore he canceled him. He has not been able to rebook them. I suspect these are ischemic wounds and I told him this. 09/01/2018; he is not been here in almost 2 months. He has been getting his dressing changed by [facility] I believe silver collagen. They are discharging him next week due to nonimprovement of the wounds predominantly. He did not follow-up with vascular surgery he was supposed to have follow-up arterial studies noninvasive. I am not sure that they have been rebooked and I do not think he has been seen. 1/23; patient's wounds are slightly larger. I believe this is ischemic. He canceled his last follow-up appointment with Dr. Myra Gianotti him that included noninvasive arterial studies. He says this was because of vertigo. He has not rescheduled these. He also has lost home health because of the chronicity of his wounds 2/6; the patient's wound on his right anterior tibia actually looks quite good. Not much difference in size but the surface of the wound looks good. He has a small area on the dorsal foot debris on the surface of this which I debrided with Anasept and gauze. He has not made a follow-up with vein and vascular 2/20; the patient's wound on his right anterior tibia continues to look good although it is not coming down in size much. The area on his right dorsal foot is about the same. Tightly adherent debris. The wound surface does not  look healthy care. The patient is expressed his to sustain for mechanical debridement in the past, I therefore been avoiding this at least until he goes back for repeat vascular appointment. So far he has not  done this he is mingling for ENT appointments related to vertigo 3/5; right anterior tibia continues to look satisfactory perhaps slightly smaller. The area on the right dorsal foot is smaller. We have been using Hydrofera Blue. 4/16; the patient wounds have actually done well in the hiatus since we have last seen him. He is using Hydrofera Blue ready 6/25; we have not seen this patient in over 2 months as he has been in the isolation phase for the Covid epidemic. He is using Hydrofera Blue ready. He has 2 wounds right on the right anterior mid tibia and one on the right dorsal foot. Both of these are somewhat improved today. Especially the dorsal 1 over the tibia. He has not been for follow- up arterial studies but I have got him to agree to allow [place] to set these up. I 7/16; the patient's wound proximally in the mid tibia area has closed over once again. We had this closed over before and he reopened the area on the dorsal foot appears better. He has been using Hydrofera Blue. We do not have an appointment for the follow-up arterial studies I ordered nor do I think he is really all that interested in getting them done. 7/30; patient has been using Hydrofera Blue. The dorsal foot wound looks some better however he has had a reopening in the mid tibia area. This is been closed over the last time. The opening is very superficial. We have been trying to get him follow-up vascular studies done with vein and vascular for about a year. He refused to do these at one point because he was at work getting worked up for vertigo. He is now more ready to move forward with this. 8/20; patient is using Hydrofera Blue. He has not made an appointment for his follow-up noninvasive vascular studies and I have talked to him about this again. Both wound areas are still open on the right mid tibia and the right dorsal foot although I think they are smaller 05/04/19-Patient is back after his vascular visit his studies  revealed that he is got ABI of 0.6 on the right, 0.46 on the left, monophasic flow to both right and left anterior tibial arteries is got great pressures to the right great toe. 10/8; we still do not have a vascular surgery appointment 1 month later. I think he may require a repeat angiogram his wounds on the right anterior to mid tibia is quite a bit larger the area on the right dorsal ankle, is small. Very little open area here 11/12; patient has not been here in over a month. He still did not have a vascular surgery appointment however before he left the clinic today we managed to arrange that for 12/9. The area on his right dorsal foot is surprisingly closed over today however the area in the mid tibia is quite a bit larger than when we saw this last time. 12/3; patient has a vascular appointment next week in follow-up from his previous revascularization. The area on the right dorsal foot remains closed. He still has a fairly sizable wound on the anterior tibia but it is superficial. We have been using silver collagen Electronic Signature(s) Signed: 07/28/2019 7:57:15 AM By: Linton Ham MD Entered By: Linton Ham on 07/27/2019 16:43:16 --------------------------------------------------------------------------------  Physical Exam Details Patient Name: Date of Service: DAWIT, TANKARD 07/27/2019 2:00 PM Medical Record [id number] Patient Account Number: [id number] Date of Birth/Sex: Treating RN: 1951-02-02 (68 y.o. M) Primary Care Provider: Katy Apo Other Clinician: Referring Provider: Treating Provider/Extender:Lamara Brecht, [name], [name]: 176 Constitutional Patient is hypertensive.. Pulse regular and within target range for patient.Marland Kitchen Respirations regular, non-labored and within target range.. Temperature is normal and within the target range for the patient.Marland Kitchen Appears in no distress. Notes Wound exam; right lower leg anteriorly. Tibial  wound required some removal of surface slough no subcutaneous debridement. The wound looks ischemic although the area on the dorsal foot has closed over. No evidence of infection Electronic Signature(s) Signed: 07/28/2019 7:57:15 AM By: Baltazar Najjar MD Entered By: Baltazar Najjar on 07/27/2019 16:45:02 -------------------------------------------------------------------------------- Physician Orders Details Patient Name: Date of Service: Reginald Ayers 07/27/2019 2:00 PM Medical Record [id number] Patient Account Number: [id number] Date of Birth/Sex: Treating RN: [birth year] (68 y.o. [name] Primary Care Provider: Katy Apo Other Clinician: Referring Provider: Treating Provider/Extender:Ayson Cherubini, [name], [name]: [phone] Verbal / Phone Orders: No Diagnosis Coding ICD-10 Coding Code Description [phone] Non-pressure chronic ulcer of right calf with necrosis of muscle E11.622 Type 2 diabetes mellitus with other skin ulcer E11.621 Type 2 diabetes mellitus with foot ulcer E11.51 Type 2 diabetes mellitus with diabetic peripheral angiopathy without gangrene Follow-up Appointments Return appointment in 3 weeks. Dressing Change Frequency Other: - twice a week Skin Barriers/Peri-Wound Care Barrier cream - apply zinc oxide around the wound bed for wetness, reddness, or maceration. Moisturizing lotion - to leg Wound Cleansing May shower with protection. Primary Wound Dressing Wound #5R Right,Anterior Lower Leg Collagen - moisten with hydrogel. Secondary Dressing Wound #5R Right,Anterior Lower Leg Foam Border - or bordered gauze. Edema Control Avoid standing for long periods of time Elevate legs to the level of the heart or above for 30 minutes daily and/or when sitting, a frequency of: - throughout the day Other: - patient to purchase support stockings for protection. Off-Loading Turn and reposition every 2 hours Electronic  Signature(s) Signed: 07/27/2019 6:37:14 PM By: Shawn Stall Signed: 07/28/2019 7:57:15 AM By: Baltazar Najjar MD Entered By: Shawn Stall on 07/27/2019 15:53:53 -------------------------------------------------------------------------------- Problem List Details Patient Name: Date of Service: Reginald Ayers. 07/27/2019 2:00 PM Medical Record [id number] Patient Account Number: [id number] Date of Birth/Sex: Treating RN: 30-Aug-1950 (68 y.o. [name] Primary Care Provider: Katy Apo Other Clinician: Referring Provider: Treating Provider/Extender:Lylliana Kitamura, [name], [name]: 176 Active Problems ICD-10 Evaluated Encounter Code Description Active Date Today Diagnosis L97.213 Non-pressure chronic ulcer of right calf with necrosis 03/06/2016 No Yes of muscle E11.622 Type 2 diabetes mellitus with other skin ulcer 03/06/2016 No Yes E11.621 Type 2 diabetes mellitus with foot ulcer 11/19/2016 No Yes E11.51 Type 2 diabetes mellitus with diabetic peripheral 03/06/2016 No Yes angiopathy without gangrene Inactive Problems ICD-10 Code Description Active Date Inactive Date L97.512 Non-pressure chronic ulcer of other part of right foot with fat 03/03/2018 03/03/2018 layer exposed Resolved Problems ICD-10 Code Description Active Date Resolved Date L97.221 Non-pressure chronic ulcer of left calf limited to breakdown of 03/06/2016 03/06/2016 skin L97.312 Non-pressure chronic ulcer of right ankle with fat layer 07/15/2016 07/15/2016 exposed L89.322 Pressure ulcer of left buttock, stage 2 09/09/2017 09/09/2017 Electronic Signature(s) Signed: 07/28/2019 7:57:15 AM By: Baltazar Najjar MD Entered By: Baltazar Najjar on 07/27/2019 16:41:49 -------------------------------------------------------------------------------- Progress Note Details Patient Name: Date of Service: Reginald Ayers.  07/27/2019 2:00 PM Medical Record [id number] Patient Account Number:  [id number] Date of Birth/Sex: Treating RN: 07-Jul-1951 (68 y.o. M) Primary Care Provider: Katy Apo Other Clinician: Referring Provider: Treating Provider/Extender:Legacy Carrender, [name], [name]: 176 Subjective History of Present Illness (HPI) 03/06/16; this is a patient who is a type II diabetic with a recent hemoglobin A1c of 7.6. He is referred from Dr. Allyson Sabal for aggressive wound care on a wound on his right anterior leg. He has known severe PAD and has an extensive interventional history. His left common iliac artery was stented in December 2003 and his right superficial femoral artery in April 2008. He has significant infrapopliteal disease bilaterally and has known occluded left SFA. His ABIs from September 2016 showed a ABI of 0.48 on the right and 0.52 on the left although he does not really describe claudication. He also has known carotid artery disease, coronary artery disease and is status post CABG in 2010. He tells me he smokes 3 cigarettes a day. The patient tells me his at the wound on the right anterior leg for about 6 months. He has a smaller area on the left medial leg that has been present for one month. He's been to see his primary physician Dr. polite and Dr. Allyson Sabal I am not clear what he is been using on this. It is felt that his ulcer is an ischemic ulcer on the pretibial region. It is noted that if we are unable to heal this it was felt he would need repeat angiography and potential percutaneous revascularization. 03/13/16; the area on the left medial leg is healed over. His ischemic wound on the right anterior leg is debridement of a lot of nonviable subcutaneous tissue we've been using Santyl 03/20/16; he has no open area on the left medial leg. The wound on the right anterior leg looks ischemic. A lot of nonviable tissue. Our intake nurse reported that he had a lot of dried blood clots around the wound presumably from last week's debridement.  There is no evidence of infection 04/16/16; the area on the right anterior leg is an ischemic wound. He is been back to see Dr. Allyson Sabal who states he is not a candidate for revascularization. He has a large area of occlusion from his groin down which cannot be revascularized. Since Dr. Allyson Sabal is aggressive with this type of situation I can only surmised that that's the correct decision 05/01/16 the area on the right leg is an ischemic wound. He has not a candidate for revascularization. He has not smoked in 48 hours. Has been using Santyl wound size is not any smaller however this surface looks somewhat better 05/08/16; ischemic wound on the right anterior leg. He is not a candidate for revascularization. He continues with nonsmoking for the last week. I use Prisma last week although that probably wasn't a good decision, covered in a difficult surface eschar today requiring debridement 05/15/16; patient with ischemic wound on his right anterior leg. He is not a candidate for revascularization. Complains of a lot of pain this week/burning over the wound area making it difficult for him to sleep 05/21/16; patient states less pain this week. The wound had circumferential fibrinous slough that I removed along with some nonviable subcutaneous tissue. We used Hydrofera Blue last week 05/28/16; patient states he has not had any pain. Some minor reduction in the area. Still using Hydrofera Blue 06/04/16; no pain still using Hydrofera Blue 06/11/16 no complaints still using Hydrofera Blue 06/18/16;  I changed him to Endoform last week. 06/25/16; wound still appears healthy. Minor debridement this week still using Endoform 07/02/16; the wound bed certainly appears healthier. Rim of epithelialization superiorly was there last week. It certainly has not progressed any further. Using Endoform 07/09/16 patient is here for a review of the wound on the right anterior leg. He has a history of severe and prolonged PAD  followed by Dr. Allyson Sabal in the setting of type 2 diabetes. I have been using Endoform for the last 3 or 4 weeks. He has a rim of epithelialization superiorly but I have not been able to advance this. He complains of pain but there is no clear infection 07/15/2016 -- the patient was here for a nurse visit but due to the presence of a new wound I was asked to see him for an opinion 07/23/16; the patient was seen last week by Dr. Meyer Russel in my absence. He had a new wound on his right dorsal ankle. Santyl was applied to this new area and also his original wound on the right anterior tibia. He arrives today complaining of a lot of pain 07/30/16; the patient has new wound on his right dorsal ankle from 2 weeks ago. This is as usual covered with a thick eschar. I think this is ischemic and I'm not expecting to be able to heal this. Santyl was applied for but denied by his insurance we will therefore use Medihoney. He only obtained this later today. He has left a wrap on all week with Santyl. 08/06/16 both wounds in the same situation still a gelatinous surface slough. He finds the Medihoney makes him feel pain. He could not afford this Santyl opiate 08/20/16; patient arrives today frustrated about his nonhealing wounds now o2 on his right leg. He arrived here with a wound on the right anterior leg mid aspect and is developed a second area on the right dorsal ankle. I have reviewed Dr. Hazle Coca notes and his visit from August/17. There he makes clear reference to the fact that the vessels were not revascularizable. The patient also describes fairly clear claudication at rest which makes him sleep with his legs in a dependent position. He has given up smoking as of yesterday 09/03/16; patient is going for a second opinion on his vascular status of vein and vascular next week. We have been using Medihoney to the wounds. He is tolerating his discomfort. 09/17/16; the patient is going to have an angiogram on  January 30. Miraculously I can't really see has had a recent angiogram although he has been to see Dr. Allyson Sabal who does not think here is cerebrovascular rise visible option. He does have stents in both legs that Dr. Allyson Sabal put and I think about 9 years ago. I would like to continue to use Medihoney until we see the results of his angiogram. 10/01/16; the patient had his angiogram. He has stent previously which was dilated and then a stent was placed in the right superficial femoral artery. He had failed balloon angioplasty of the right posterior tibial artery. A further stent was placed in the superficial femoral artery. An angioplasty was also done. He was noted to have severe stenosis versus occlusion of the posterior tibial artery which is the dominant vessel across the foot. Intervention was attempted here but was unsuccessful. If the patient is unable to heal his wounds it was felt he could be brought back for repeat attempt 10/15/16- patient is here for follow-up evaluation of his right lower extremity  and right dorsal foot ulcers. He was unable to make last week's appointment secondary to a death in the family. He has been using Iodosorb/Iodoflex dressing changes per home health. He states that his primary care physician follows an A1c every 3 months, with his last A1c being 7.8 which is elevated from the previous. He states he has a scheduled A1c for next month. He also states he has a follow-up appointment with Vein and Vascular in April 10/22/16- Mr. Porte did have an xray of the right foot, revealing no definite evidence of bone destruction to suggest osteomyelitis. This was performed at [facility]. He has been wearing his show with a rolled tongue since the last visit when he leaves the home, otherwise he wears loose house slippers around the house. He is voicing no complaints or concerns since his last visit. He admits to smoking "maybe one a week" 10/29/16- patient is here for  follow-up evaluation of his right lower extremity and right dorsal foot ulcers. He states that he has completely stopped smoking but does admit to dipping tobacco. We have not found out his coverage for both [employer] and Apligraf. He continues to offload this area with his walking shoe otherwise wears a slipper at home. He brought this (and it does appear to allow for relief of pressure to the dorsal part of his foot. 11/05/16; patient is here for follow-up of his right lower extremity and right dorsal foot ulcers. He has been revascularized. He no longer smokes. We have been using Prisma. 11/12/16- Mr. Strauch arrives for follow up evaluation of his right lower extremity ulcers. We have been using Prisma. He is planned for an Apligraf application today. 11/19/16- Mr.Madril arrives for follow-up evaluation of his right lower extremity and right dorsal foot ulcer one week after Apligraf #1 application. He states the home health came out on Monday and change the foam, Kerlix and Coban and and they did not interrupt the Adaptic touch. He has a follow-up appointment with ultrasound on Monday with vein and vascular 11/26/16- he is here for follow-up evaluation. He is s/p Apligraf #1 application 2 weeks ago. he had an appointment with vascular on Monday for arterial ultrasound. In evaluation of the stent to the right SFA which indicates biphasic waveform proximal to the stent and at the origin of the stent otherwise monophasic flow throughout the stent and at the distal aspect of the stent. An ABI done in that office on Monday was 0.73 (previously 0.47). He has biphasic flow in the right posterior tibial and monophasic flow in the dorsalis pedis. He has a follow-up with Dr. Myra Gianotti on Monday, 4/9 12/03/16- He is here for follow-up evaluation. He did see Dr. Myra Gianotti on Monday and has a scheduled aortogram with lower extremity arteriogram on the 17th. He was not compressed over the last week. His wound culture  came back Oxacillin sensitive staph aureus. He was prescribed Keflex and has been taking that without any voiced complaints. He states that he started taking his Lasix, although he did not contact or have an appointment with Dr. Nehemiah Settle. He does have an appointment in May 12/10/16- he is here for follow-up evaluation. He had an intervention on 4/17 with Dr. Myra Gianotti. According to his dictation 1) the patient has a widely patent right SFA and popliteal stent 2) after the an 80% occlusion of the right PTA was treated with atherectomy and balloon angioplasty with a residual stenosis of less than 5%. The patient is voicing significant improvement and  overall feeling; states that he can now sleep in the bed without any symptoms. His completed his Keflex with no adverse reaction. He continues to self-regulate his Lasix, he is not on any potassium supplementation. 12/17/16; Arrives today with weeping edema thru the dorsal foot wound and lower leg. He has not been using his Lasix and did not have compression on his leg. According to the patient he has 40mg  Lasix at home which he takes prn but not in weeks 12/21/16; less edema and no weeping edema. Apligraf #1 12/31/16; both wounds look somewhat better especially the area distally. Apligraf #2 01/14/17; Apligraf #3. Wounds looked improved. Patient saw main and vascular. ABIs on 01/11/17 done in their office showed a patent SFA stent status post atherectomy with angioplasty of the right PTA [12/08/16] biphasic flow on the right 0.95 monophasic flow on the left 0.45. 01/28/17; Apligraf #4. Wounds not particularly better than I remember. 02/11/17; Apligraf #5 the wounds have some improvement. The distal 1 has had some epithelialization proximal one is somewhat smaller. 02/25/17; he arrived in clinic today with a disappointing amount of surface necrotic material. This is especially true on the distal anterior foot/ankle wound. Change primary dressing the Hydrofera Blue  today 03/04/17; some improvement in the proximal wound on the tibia. The distal wound on the dorsal foot/ankle has too much necrotic surface. We have been using Hydrofera Blue although there was apparently some problem with obtaining it through his home health company however the patient reports he now has it 03/11/17; wounds measure slightly smaller. Surface looks questionable although with the improvement in dimensions will likely continue with the [facility] without debridement today 03/18/17; no major change in these wounds. Questionable surface. Debridement today. Using [facility] 03/25/17; the patient has follow-up noninvasive vascular tests with vein and vascular later this month. The wounds himself are not measuring any different however the proximal wound bed looks better with the Iodoflex not much change with the area over the dorsal foot. 04/22/17; patient had repeat angiography on 04/20/17. He had a drug-coated balloon angioplasty of the right superficial femoral artery balloon angioplasty right common femoral artery. He had stenosis of previously placed stents within the right superficial femoral artery this was treated with drug coated balloon angioplasty. He continues to have single vessel runoff via the peroneal artery He has been using Hydrofera Blue 04/29/17; no major change this week. Wound beds appear healthy. No debridement. He apparently did not tolerate Iodoflex which I was using prior to modification. I'm going to change him to Endo form with hydrogel 05/13/17; wound beds again appear a lot better, paradoxically the wound on the dorsal foot appears to be progressing better than the proximal wound. Using Endoform with hydrogel 06/03/17; doing well. wounds smaller especially the wound on the dorsal foot. using endoform 06/17/17; two-week follow-up everything is going really nicely here. Using Endoform he only has one small open area left on the right dorsal foot/ankle. Also  improved on the right anterior leg. He is retired from smoking since starting with [place] in this clinic 07/01/17; two-week follow-up the area on his right anterior foot/ankle is just about closed. The area superiorly on the lower mid calf as adherent necrotic debris which will need debridement but it is also down in terms of dimensions. We'll use Endoform 07/19/17 on evaluation today patient appears to be doing well with his Current wound care measures and there does not appear to be necrotic tissue which is good news. The Endoform however does  seem to be causing some epithelialization although this is loose at the proximal/12 o'clock location and seems to be pulling off with dressing changes. No fevers, chills, nausea, or vomiting noted at this time. He has no significant discomfort. 08/05/17; the patient has 2 small open areas on the proximal foot and a larger wound on the right anterior leg. Change to [facility] Blue last time probably because of the hyper granulation proximally 08/26/17; the patient has 2 small open areas on the proximal foot wound which is closed. The other is covered with a thin surface eschar. There is a similar wound on the right anterior leg. We've been using Hydrofera Blue 09/09/17*; the patient went for angiogram with Dr. Myra Gianotti. Apparently a routine scheduled arterial duplex suggested occlusion of his stent however with the angiography the stent was widely patent. No procedure was necessary. He also apparently has had a long-standing pressure area on the left buttock which dates back to prior to Thanksgiving although he's never really said anything to anybody about this. He is been attempting to treated himself with triple antibiotic cream etc. We've been using silver collagen to the wounds on his right leg and dorsal right foot. These are largely unchanged today 09/23/17; patient's wounds are smaller in both areas and are just about closed. We have been using silver  collagen he shows me an area on his buttock that is in the gluteal fold. This would be an atypical place for a pressure sore like a herpetic lesion although he says it's been there for a long time 2/14/19the patient's wound is totally closed. The area on the dorsal foot is also close to closing. I looked again at the area on his left buttock in the gluteal fold. He tells me that this is been there for 6 months. This more looks more like a pressure ulcer to me than it did when I looked at this 2 weeks ago. I simply put topical antibiotics on this but we are going to have to change to a more formal dressing 10/13/17 on evaluation today patient appears to be doing very well in regard to his right dorsal foot wound. He has been tolerating the dressing changes without complication. Fortunately there does not appear to be any evidence of infection which is good news. He continues to have a left gluteal ulcer as well as he has been tolerating the Prisma dressing on although this is not significantly smaller it does sound like he's been putting other things on this as well including some kind of frame he in fact this order another cream as well although I'm not exactly sure what this is. It's something that he "researched on the Internet that is supposed to make My wound healed him seven days." At worst his pain is a 2/10 in regard to the left gluteal region but this is mainly with cleansing of the wound. 10/21/17; his right dorsal foot wound is just about closed. The area on the right anterior shin area is closed. He has a left gluteal ulcer which is probably a pressure ulcer. He is been using his own creams that he spot on the Internet. This wound is also better. 11/04/17; this is a patient with severe PAD who is been revascularized by vein and vascular. He had 2 difficult wound areas one on the right anterior shin and the right anterior dorsal foot. More recently he has developed a left gluteal ulcer  which is probably a pressure ulcer although it has some atypical  nature to it. His proximal right leg has remained healed. He has a small open area on the right dorsal foot with a nonviable surface 11/18/17; this is a patient with severe PAD who is been revascularized by vein and vascular. The area on the right proximal leg has healed over. He has a very small ischemic wound on the right dorsal foot which was the second wound. He also had a pressure ulcer on his buttock which is healed out as of today 12/02/17; wound on the dorsal foot which I think is largely ischemic. He is upset with me for doing a surface debridement on this last visit however he still has a nonviable surface on this and I think this is largely a blood flow issue 12/16/17; wound on the dorsal foot which I think is largely ischemic. He sees Dr. [place] Sink ham again next week. We've been using Iodoflex to this not much of a healthy surface. 12/30/17; the patient went back to see vascular surgery. He had noninvasive testing. His ABI on the right was 0.70 compared to 0.60 last time on the left 0.57 compared to 0.44. TBI's were 0.3 on the right 0.29 on the left essentially unchanged from last time. Waveforms were monophasic. He was seen by a provider. Noted nonpalpable pulses. Overall they felt that they had slightly improved bilateral ABIs and right TBI. Her disease bilaterally with all waveforms monophasic. They will follow his lower extremity arterial duplex in 6 months. They're calling the areas on his feet venous stasis ulcers. He does not clearly describe claudication 01/13/18; The patient's wound is slightly larger in width and nonviable surface. We've been using Iodoflex. He has home help changing the dressings. I have been reluctant to do debridement on this since he blames a debridement for reopening his wound actually still think this is arterial even given recent arterial results and evaluation [see above) 01/27/18; using iodoflex.  better surface. 02/10/18; using Iodoflex. Surface is some better. Changed him to silver collagen today 03/02/18 on evaluation today patient continues to show signs of slow but good progress in regard to his left lower extremity ulcer. He has been tolerating the dressing changes without complication which is good news. He does have somewhat limited/poor vascular flow but nonetheless he does seem to be healing just at a much slower place and what would be otherwise recommended and expected. I do believe the silver collagen dressing change has been of benefit. 03/22/18; slow but good progress in terms of his right anterior foot wound. These are largely ischemic wounds but he has been revascularized. He is continued with silver collagen 04/07/18 on evaluation today patient appears to be doing rather well in regard to the wound on the dorsal surface of his right foot. He's been tolerating the dressing changes without complication. Fortunately there does not appear to be the evidence of infection at this time which is great news. Overall I'm very pleased with how things seem to appear at this point. 04/21/18; dorsal right foot wound secondary to PAD. Wound is making progress by 2 mm. Surface of it is passable. He is been using silver collagen 05/05/18; we received a call from home health this week that the original proximal tibial area had reopened on the right. He now has 2 open areas including the one we've been watching for a long period on the foot. The new open area seems to be right in the same place as the original wound previously was located 05/13/2018; wounds are both actually  larger. I debrided these last week both wounds are bigger. The surface looks better I been using Iodoflex. 05/27/2018; the wounds are larger but the surface of them looks better with Iodoflex. He has a follow-up with Dr. Myra GianottiBrabham sometime in November. I tried to move these up we were not able to get an earlier appointment I  will try to text him. 06/16/2018; the patient's wounds are larger but with a generally cleaner surface. He has extensive set of investigations with Dr. Myra GianottiBrabham on November 5. This includes ABIs TBI's and I believe an ultrasound of the stent. 07/04/2018; the patient apparently stated he felt too dizzy to go for his arterial studies on 11/5 therefore he canceled him. He has not been able to rebook them. I suspect these are ischemic wounds and I told him this. 09/01/2018; he is not been here in almost 2 months. He has been getting his dressing changed by [facility] I believe silver collagen. They are discharging him next week due to nonimprovement of the wounds predominantly. He did not follow-up with vascular surgery he was supposed to have follow-up arterial studies noninvasive. I am not sure that they have been rebooked and I do not think he has been seen. 1/23; patient's wounds are slightly larger. I believe this is ischemic. He canceled his last follow-up appointment with Dr. Myra GianottiBrabham him that included noninvasive arterial studies. He says this was because of vertigo. He has not rescheduled these. He also has lost home health because of the chronicity of his wounds 2/6; the patient's wound on his right anterior tibia actually looks quite good. Not much difference in size but the surface of the wound looks good. He has a small area on the dorsal foot debris on the surface of this which I debrided with Anasept and gauze. He has not made a follow-up with vein and vascular 2/20; the patient's wound on his right anterior tibia continues to look good although it is not coming down in size much. The area on his right dorsal foot is about the same. Tightly adherent debris. The wound surface does not look healthy care. The patient is expressed his to sustain for mechanical debridement in the past, I therefore been avoiding this at least until he goes back for repeat vascular appointment. So far he has not  done this he is mingling for ENT appointments related to vertigo 3/5; right anterior tibia continues to look satisfactory perhaps slightly smaller. The area on the right dorsal foot is smaller. We have been using Hydrofera Blue. 4/16; the patient wounds have actually done well in the hiatus since we have last seen him. He is using Hydrofera Blue ready 6/25; we have not seen this patient in over 2 months as he has been in the isolation phase for the Covid epidemic. He is using Hydrofera Blue ready. He has 2 wounds right on the right anterior mid tibia and one on the right dorsal foot. Both of these are somewhat improved today. Especially the dorsal 1 over the tibia. He has not been for follow- up arterial studies but I have got him to agree to allow us to set these up. I 7/16; the patient's wound proximally in the mid tibia area has closed over once again. We had this closed over before and he reopened the area on the dorsal foot appears better. He has been using Hydrofera Blue. We do not have an appointment for the follow-up arterial studies I ordered nor do I think he is really  all that interested in getting them done. 7/30; patient has been using Hydrofera Blue. The dorsal foot wound looks some better however he has had a reopening in the mid tibia area. This is been closed over the last time. The opening is very superficial. We have been trying to get him follow-up vascular studies done with vein and vascular for about a year. He refused to do these at one point because he was at work getting worked up for vertigo. He is now more ready to move forward with this. 8/20; patient is using Hydrofera Blue. He has not made an appointment for his follow-up noninvasive vascular studies and I have talked to him about this again. Both wound areas are still open on the right mid tibia and the right dorsal foot although I think they are smaller 05/04/19-Patient is back after his vascular visit his studies  revealed that he is got ABI of 0.6 on the right, 0.46 on the left, monophasic flow to both right and left anterior tibial arteries is got great pressures to the right great toe. 10/8; we still do not have a vascular surgery appointment 1 month later. I think he may require a repeat angiogram his wounds on the right anterior to mid tibia is quite a bit larger the area on the right dorsal ankle, is small. Very little open area here 11/12; patient has not been here in over a month. He still did not have a vascular surgery appointment however before he left the clinic today we managed to arrange that for 12/9. The area on his right dorsal foot is surprisingly closed over today however the area in the mid tibia is quite a bit larger than when we saw this last time. 12/3; patient has a vascular appointment next week in follow-up from his previous revascularization. The area on the right dorsal foot remains closed. He still has a fairly sizable wound on the anterior tibia but it is superficial. We have been using silver collagen Objective Constitutional Patient is hypertensive.. Pulse regular and within target range for patient.Marland Kitchen Respirations regular, non-labored and within target range.. Temperature is normal and within the target range for the patient.Marland Kitchen Appears in no distress. Vitals Time Taken: 2:52 PM, Height: 61 in, Weight: 122 lbs, BMI: 23, Temperature: 98.0 F, Pulse: 78 bpm, Respiratory Rate: 16 breaths/min, Blood Pressure: 181/67 mmHg. General Notes: Wound exam; right lower leg anteriorly. Tibial wound required some removal of surface slough no subcutaneous debridement. The wound looks ischemic although the area on the dorsal foot has closed over. No evidence of infection Integumentary (Hair, Skin) Wound #5R status is Open. Original cause of wound was Gradually Appeared. The wound is located on the Right,Anterior Lower Leg. The wound measures 3cm length x 3cm width x 0.1cm depth; 7.069cm^2  area and 0.707cm^3 volume. There is Fat Layer (Subcutaneous Tissue) Exposed exposed. There is no tunneling or undermining noted. There is a medium amount of serosanguineous drainage noted. The wound margin is flat and intact. There is medium (34-66%) red granulation within the wound bed. There is a medium (34-66%) amount of necrotic tissue within the wound bed including Adherent Slough. Assessment Active Problems ICD-10 Non-pressure chronic ulcer of right calf with necrosis of muscle Type 2 diabetes mellitus with other skin ulcer Type 2 diabetes mellitus with foot ulcer Type 2 diabetes mellitus with diabetic peripheral angiopathy without gangrene Procedures Wound #5R Pre-procedure diagnosis of Wound #5R is a Diabetic Wound/Ulcer of the Lower Extremity located on the Right,Anterior Lower Leg .  Severity of Tissue Pre Debridement is: Limited to breakdown of skin. There was a Selective/Open Wound Non-Viable Tissue Debridement with a total area of 9 sq cm performed by Maxwell Caul., MD. With the following instrument(s): Curette to remove Non-Viable tissue/material. Material removed includes [facility] after achieving pain control using Lidocaine 4% Topical Solution. A time out was conducted at 15:45, prior to the start of the procedure. A Minimum amount of bleeding was controlled with Pressure. The procedure was tolerated well with a pain level of 0 throughout and a pain level of 0 following the procedure. Post Debridement Measurements: 3cm length x 3cm width x 0.1cm depth; 0.707cm^3 volume. Character of Wound/Ulcer Post Debridement requires further debridement. Severity of Tissue Post Debridement is: Limited to breakdown of skin. Post procedure Diagnosis Wound #5R: Same as Pre-Procedure Plan Follow-up Appointments: Return appointment in 3 weeks. Dressing Change Frequency: Other: - twice a week Skin Barriers/Peri-Wound Care: Barrier cream - apply zinc oxide around the wound bed for wetness,  reddness, or maceration. Moisturizing lotion - to leg Wound Cleansing: May shower with protection. Primary Wound Dressing: Wound #5R Right,Anterior Lower Leg: Collagen - moisten with hydrogel. Secondary Dressing: Wound #5R Right,Anterior Lower Leg: Foam Border - or bordered gauze. Edema Control: Avoid standing for long periods of time Elevate legs to the level of the heart or above for 30 minutes daily and/or when sitting, a frequency of: - throughout the day Other: - patient to purchase support stockings for protection. Off-Loading: Turn and reposition every 2 hours 1. Continue collagen moistened with hydrogel and border foam 2. He does not want to be seen here any more often than 3 weeks we gave him a 3-week appointment 2. I believe he sees Dr. Myra Gianotti him next week Electronic Signature(s) Signed: 07/28/2019 7:57:15 AM By: Baltazar Najjar MD Entered By: Baltazar Najjar on 07/27/2019 16:45:47 -------------------------------------------------------------------------------- SuperBill Details Patient Name: Date of Service: Reginald Ayers 07/27/2019 Medical Record [id number] Patient Account Number: [id number] Date of Birth/Sex: Treating RN: 1951/08/03 (68 y.o. Harlon Flor, Millard.Loa Primary Care Provider: Katy Apo Other Clinician: Referring Provider: Treating Provider/Extender:Katielynn Horan, [name], [name]: 176 Diagnosis Coding ICD-10 Codes Code Description [phone] Non-pressure chronic ulcer of right calf with necrosis of muscle E11.622 Type 2 diabetes mellitus with other skin ulcer E11.621 Type 2 diabetes mellitus with foot ulcer E11.51 Type 2 diabetes mellitus with diabetic peripheral angiopathy without gangrene Facility Procedures The patient participates with Medicare or their insurance follows the Medicare Facility Guidelines: CPT4 Code Description Modifier Quantity [phone] - DEBRIDE WOUND 1ST 20 SQ CM OR < 1 ICD-10 Diagnosis  Description L97.213 Non-pressure chronic ulcer  of right calf with necrosis of muscle Physician Procedures CPT4 Code Description: [id number] - WC PHYS DEBR WO ANESTH 20 SQ CM ICD-10 Diagnosis Description L97.213 Non-pressure chronic ulcer of right calf with necrosis of Modifier: muscle Quantity: 1 Electronic Signature(s) Signed: 07/28/2019 7:57:15 AM By: Baltazar Najjar MD Entered By: Baltazar Najjar on 07/27/2019 16:46:03

## 2019-08-01 ENCOUNTER — Telehealth (HOSPITAL_COMMUNITY): Payer: Self-pay

## 2019-08-01 NOTE — Progress Notes (Signed)
GURVEER, COLUCCI ([id number]) Visit Report for 07/27/2019 Arrival Information Details Patient Name: Date of Service: JLON, BETKER 07/27/2019 2:00 PM Medical Record [id number] Patient Account Number: [id number] Date of Birth/Sex: Treating RN: 13-Feb-1951 (68 y.o. [name] Primary Care Angeliz Settlemyre: Kandice Hams Other Clinician: Referring Forever Arechiga: Treating Daymen Hassebrock/Extender:Robson, Alice Reichert, Lowella Dandy in Treatment: 176 Visit Information History Since Last Visit Added or deleted any medications: No Patient Arrived: Kasandra Knudsen Any new allergies or adverse reactions: No Arrival Time: 14:52 Had a fall or experienced change in No Accompanied By: alone activities of daily living that may affect Transfer Assistance: None risk of falls: Patient Identification Verified: Yes Signs or symptoms of abuse/neglect since last No Secondary Verification Process Yes visito Completed: Hospitalized since last visit: No Patient Requires Transmission- No Implantable device outside of the clinic excluding No Based Precautions: cellular tissue based products placed in the center Patient Has Alerts: Yes since last visit: Patient Alerts: Patient on Blood Has Dressing in Place as Prescribed: Yes Thinner Pain Present Now: No Electronic Signature(s) Signed: 07/31/2019 5:51:44 PM By: Levan Hurst RN, BSN Entered By: Levan Hurst on 07/27/2019 14:52:36 -------------------------------------------------------------------------------- Encounter Discharge Information Details Patient Name: Date of Service: [name]. 07/27/2019 2:00 PM Medical Record [id number] Patient Account Number: [id number] Date of Birth/Sex: Treating RN: 10-17-1950 (68 y.o. Ernestene Mention Primary Care Caroly Purewal: Kandice Hams Other Clinician: Referring Nabor Thomann: Treating Romero Letizia/Extender:Robson, Alice Reichert, Lowella Dandy in Treatment: 176 Encounter Discharge Information Items Post  Procedure Vitals Discharge Condition: Stable Temperature (F): 98 Ambulatory Status: Cane Pulse (bpm): 77 Discharge Destination: Home Respiratory Rate (breaths/min): 18 Transportation: Private Auto Blood Pressure (mmHg): 153/81 Accompanied By: friend Schedule Follow-up Appointment: Yes Clinical Summary of Care: Patient Declined Electronic Signature(s) Signed: 07/27/2019 5:56:19 PM By: Baruch Gouty RN, BSN Entered By: Baruch Gouty on 07/27/2019 16:00:14 -------------------------------------------------------------------------------- Lower Extremity Assessment Details Patient Name: Date of Service: [name] 07/27/2019 2:00 PM Medical Record [id number] Patient Account Number: [id number] Date of Birth/Sex: Treating RN: [birth year] (68 y.o. [name] Primary Care Chanelle Hodsdon: Kandice Hams Other Clinician: Referring Jeovany Huitron: Treating Dayven Linsley/Extender:Robson, Alice Reichert, Lowella Dandy in Treatment: 176 Edema Assessment Assessed: [Left: No] [Right: No] Edema: [Left: Ye] [Right: s] Calf Left: Right: Point of Measurement: 30 cm From Medial Instep cm 28 cm Ankle Left: Right: Point of Measurement: 10 cm From Medial Instep cm 18.5 cm Vascular Assessment Pulses: Dorsalis Pedis Palpable: [Right:No] Electronic Signature(s) Signed: 07/31/2019 5:51:44 PM By: Levan Hurst RN, BSN Entered By: Levan Hurst on 07/27/2019 14:58:39 -------------------------------------------------------------------------------- Multi Wound Chart Details Patient Name: Date of Service: [name] 07/27/2019 2:00 PM Medical Record [id number] Patient Account Number: [id number] Date of Birth/Sex: Treating RN: 10-Aug-1951 (68 y.o. M) Primary Care Kashius Dominic: Kandice Hams Other Clinician: Referring Taysen Bushart: Treating Kenidy Crossland/Extender:Robson, Alice Reichert, Lowella Dandy in Treatment: 176 Vital Signs Height(in): 61 Pulse(bpm): 65 Weight(lbs): 122 Blood  Pressure(mmHg): 181/67 Body Mass Index(BMI): 23 Temperature(F): 98.0 Respiratory 16 Rate(breaths/min): Photos: [5R:No Photos] [N/A:N/A] Wound Location: [5R:Right Lower Leg - Anterior N/A] Wounding Event: [5R:Gradually Appeared] [N/A:N/A] Primary Etiology: [5R:Diabetic Wound/Ulcer of the N/A Lower Extremity] Comorbid History: [5R:Chronic sinus problems/congestion, Chronic Obstructive Pulmonary Disease (COPD), Coronary Artery Disease, Hypertension, Peripheral Arterial Disease, Type II Diabetes, Received Chemotherapy] [N/A:N/A] Date Acquired: [5R:04/21/2018] [N/A:N/A] Weeks of Treatment: [5R:64] [N/A:N/A] Wound Status: [5R:Open] [N/A:N/A] Wound Recurrence: [5R:Yes] [N/A:N/A] Measurements L x W x D 3x3x0.1 [N/A:N/A] (cm) Area (cm) : [5R:7.069] [N/A:N/A] Volume (cm) : [5R:0.707] [N/A:N/A] % Reduction in Area: [5R:-650.40%] [N/A:N/A] % Reduction  in Volume: -652.10% [N/A:N/A] Classification: [5R:Grade 2] [N/A:N/A] Exudate Amount: [5R:Medium] [N/A:N/A] Exudate Type: [5R:Serosanguineous] [N/A:N/A] Exudate Color: [5R:red, brown] [N/A:N/A] Wound Margin: [5R:Flat and Intact] [N/A:N/A] Granulation Amount: [5R:Medium (34-66%)] [N/A:N/A] Granulation Quality: [5R:Red] [N/A:N/A] Necrotic Amount: [5R:Medium (34-66%)] [N/A:N/A] Exposed Structures: [5R:Fat Layer (Subcutaneous N/A Tissue) Exposed: Yes Fascia: No Tendon: No Muscle: No Joint: No Bone: No] Epithelialization: [5R:Small (1-33%)] [N/A:N/A] Debridement: [5R:Debridement - Selective/Open Wound] [N/A:N/A] Pre-procedure [5R:15:45] [N/A:N/A] Verification/Time Out Taken: Pain Control: [5R:Lidocaine 4% Topical Solution] [N/A:N/A] Tissue Debrided: [5R:Slough] Level: [5R:Non-Viable Tissue] Debridement Area (sq cm):9 Instrument: [5R:Curette] Bleeding: [5R:Minimum] Hemostasis Achieved: [5R:Pressure] Procedural Pain: [5R:0] Post Procedural Pain: [5R:0] Debridement Treatment Procedure was tolerated Response: [5R:well] Post Debridement  [5R:3x3x0.1] Measurements L x W x D (cm) Post Debridement [5R:0.707] Volume: (cm) Procedures Performed: Debridement Treatment Notes Wound #5R (Right, Anterior Lower Leg) 3. Primary Dressing Applied Collegen [employer] 4. Secondary Dressing Dry Gauze Roll Gauze Electronic Signature(s) Signed: 07/28/2019 7:57:15 AM By: Baltazar Najjar MD Entered By: Baltazar Najjar on 07/27/2019 16:41:59 -------------------------------------------------------------------------------- Multi-Disciplinary Care Plan Details Patient Name: Date of Service: [name] 07/27/2019 2:00 PM Medical Record [id number] Patient Account Number: [id number] Date of Birth/Sex: Treating RN: February 08, 1951 (68 y.o. [name] Primary Care Misako Roeder: Katy Apo Other Clinician: Referring Iana Buzan: Treating Skarlette Lattner/Extender:Robson, [name], [name]: 176 Active Inactive Nutrition Nursing Diagnoses: Potential for alteratiion in Nutrition/Potential for imbalanced nutrition Goals: Patient/caregiver agrees to and verbalizes understanding of need to obtain nutritional consultation Date Initiated: 03/09/2019 Target Resolution Date: 08/25/2019 Goal Status: Active Interventions: Provide education on nutrition Treatment Activities: Education provided on Nutrition : 07/06/2019 Patient referred to Primary Care Physician for further nutritional evaluation : 03/09/2019 Notes: Electronic Signature(s) Signed: 07/27/2019 6:37:14 PM By: Shawn Stall Entered By: Shawn Stall on 07/27/2019 14:59:40 -------------------------------------------------------------------------------- Pain Assessment Details Patient Name: Date of Service: LYAM, PROVENCIO 07/27/2019 2:00 PM Medical Record [id number] Patient Account Number: [id number] Date of Birth/Sex: Treating RN: 1951/03/18 (68 y.o. [name] Primary Care Reise Hietala: Katy Apo Other Clinician: Referring Latanga Nedrow: Treating  Melton Walls/Extender:Robson, [name], [name]: 176 Active Problems Location of Pain Severity and Description of Pain Patient Has Paino No Site Locations Pain Management and Medication Current Pain Management: Electronic Signature(s) Signed: 07/31/2019 5:51:44 PM By: Zandra Abts RN, BSN Entered By: Zandra Abts on 07/27/2019 14:52:43 -------------------------------------------------------------------------------- Patient/Caregiver Education Details Patient Name: Date of Service: [name] 12/3/2020andnbsp2:00 PM Medical Record [id number] Patient Account Number: [id number] Date of Birth/Gender: 09-12-1950 (68 y.o. M) Treating RN: Shawn Stall Primary Care Physician: Katy Apo Other Clinician: Referring Physician: Treating Physician/Extender:Robson, [name], [name]: 176 Education Assessment Education Provided To: Patient Education Topics Provided Nutrition: Handouts: Nutrition Methods: Explain/Verbal Responses: Reinforcements needed Electronic Signature(s) Signed: 07/27/2019 6:37:14 PM By: Shawn Stall Entered By: Shawn Stall on 07/27/2019 14:59:52 -------------------------------------------------------------------------------- Wound Assessment Details Patient Name: Date of Service: JAQUAIL, MCLEES 07/27/2019 2:00 PM Medical Record [id number] Patient Account Number: [id number] Date of Birth/Sex: Treating RN: [birth year] (68 y.o. [name] Primary Care Jaylean Buenaventura: Katy Apo Other Clinician: Referring Ishia Tenorio: Treating Anye Brose/Extender:Robson, [name], [name]: 176 Wound Status Wound Number: 5R Primary Diabetic Wound/Ulcer of the Lower Extremity Etiology: Wound Location: Right Lower Leg - Anterior Wound Open Wounding Event: Gradually Appeared Status: Date Acquired: 04/21/2018 Comorbid Chronic sinus problems/congestion, Chronic Weeks Of  Treatment: 64 History: Obstructive Pulmonary Disease (COPD), Clustered Wound: No Coronary Artery Disease, Hypertension, Peripheral Arterial Disease, Type II Diabetes, Received Chemotherapy Photos Wound Measurements Length: (  cm) 3 Width: (cm) 3 Depth: (cm) 0.1 Area: (cm) 7.069 Volume: (cm) 0.707 Wound Description Classification: Grade 2 Wound Margin: Flat and Intact Exudate Amount: Medium Exudate Type: Serosanguineous Exudate Color: red, brown Wound Bed Granulation Amount: Medium (34-66%) Granulation Quality: Red Necrotic Amount: Medium (34-66%) Necrotic Quality: Adherent Slough fter Cleansing: No ino Yes Exposed Structure ed: No ubcutaneous Tissue) Exposed: Yes ed: No ed: No d: No : No % Reduction in Area: -650.4% % Reduction in Volume: -652.1% Epithelialization: Small (1-33%) Tunneling: No Undermining: No Foul Odor A Slough/Fibr Fascia Expos Fat Layer (S Tendon Expos Muscle Expos Joint Expose Bone Exposed Treatment Notes Wound #5R (Right, Anterior Lower Leg) 3. Primary Dressing Applied Collegen AG 4. Secondary Dressing Dry Gauze Roll Gauze Electronic Signature(s) Signed: 07/31/2019 5:51:44 PM By: Zandra AbtsLynch, Shatara RN, BSN Signed: 08/01/2019 4:34:42 PM By: Benjaman KindlerJones, Dedrick EMT/HBOT Entered By: Benjaman KindlerJones, Dedrick on 07/31/2019 13:41:19 -------------------------------------------------------------------------------- Vitals Details Patient Name: Date of Service: Bertram GalaUSS, Yer H. 07/27/2019 2:00 PM Medical Record [id number] Patient Account Number: [id number] Date of Birth/Sex: Treating RN: May 03, 1951 (68 y.o. [name]) Lynch, Shatara Primary Care Meta Kroenke: Katy ApoPolite, Ronald D Other Clinician: Referring Iveliz Garay: Treating Fynn Vanblarcom/Extender:Robson, Almira CoasterMichael Polite, Chalmers Guestonald D Weeks in Treatment: 176 Vital Signs Time Taken: 14:52 Temperature (F): 98.0 Height (in): 61 Pulse (bpm): 78 Weight (lbs): 122 Respiratory Rate (breaths/min): 16 Body Mass Index (BMI):  23 Blood Pressure (mmHg): 181/67 Reference Range: 80 - 120 mg / dl Electronic Signature(s) Signed: 07/31/2019 5:51:44 PM By: Zandra AbtsLynch, Shatara RN, BSN Entered By: Zandra AbtsLynch, Shatara on 07/27/2019 14:54:43

## 2019-08-01 NOTE — Telephone Encounter (Signed)

## 2019-08-02 ENCOUNTER — Ambulatory Visit (INDEPENDENT_AMBULATORY_CARE_PROVIDER_SITE_OTHER): Payer: Medicare Other | Admitting: Family

## 2019-08-02 ENCOUNTER — Other Ambulatory Visit: Payer: Self-pay

## 2019-08-02 ENCOUNTER — Ambulatory Visit (INDEPENDENT_AMBULATORY_CARE_PROVIDER_SITE_OTHER)
Admission: RE | Admit: 2019-08-02 | Discharge: 2019-08-02 | Disposition: A | Discharge status: Home / Self Care | Payer: Medicare Other | Source: Ambulatory Visit | Attending: Family | Admitting: Family

## 2019-08-02 ENCOUNTER — Ambulatory Visit (HOSPITAL_COMMUNITY)
Admission: RE | Admit: 2019-08-02 | Discharge: 2019-08-02 | Disposition: A | Discharge status: Home / Self Care | Payer: Medicare Other | Source: Ambulatory Visit | Attending: Family | Admitting: Family

## 2019-08-02 ENCOUNTER — Encounter: Payer: Self-pay | Admitting: Family

## 2019-08-02 VITALS — BP 159/63 | HR 83 | Temp 97.2°F | Resp 20 | Ht 61.0 in | Wt 127.0 lb

## 2019-08-02 DIAGNOSIS — I779 Disorder of arteries and arterioles, unspecified: Secondary | ICD-10-CM

## 2019-08-02 DIAGNOSIS — Z720 Tobacco use: Secondary | ICD-10-CM

## 2019-08-02 DIAGNOSIS — I6521 Occlusion and stenosis of right carotid artery: Secondary | ICD-10-CM

## 2019-08-02 DIAGNOSIS — I6522 Occlusion and stenosis of left carotid artery: Secondary | ICD-10-CM | POA: Diagnosis not present

## 2019-08-02 DIAGNOSIS — Z87891 Personal history of nicotine dependence: Secondary | ICD-10-CM

## 2019-08-02 DIAGNOSIS — L97811 Non-pressure chronic ulcer of other part of right lower leg limited to breakdown of skin: Secondary | ICD-10-CM | POA: Diagnosis not present

## 2019-08-02 DIAGNOSIS — I872 Venous insufficiency (chronic) (peripheral): Secondary | ICD-10-CM

## 2019-08-02 DIAGNOSIS — I6523 Occlusion and stenosis of bilateral carotid arteries: Secondary | ICD-10-CM | POA: Insufficient documentation

## 2019-08-02 DIAGNOSIS — Z9862 Peripheral vascular angioplasty status: Secondary | ICD-10-CM

## 2019-08-02 NOTE — Progress Notes (Signed)
[facility] [place] HISTORY AND PHYSICAL   CC: Follow up extracranial carotid artery stenosis and peripheral artery occlusive disease    History of Present Illness:   Reginald Ayers is a 68 y.o. male who Dr. Sophronia Simas January 2018 for a second opinion regarding ulcers in hisright leg.  Heis s/p drug coated balloon angioplasty ofright superficial femoral artery, and balloon angioplastyofright common femoral artery on 04-20-17 by Dr. Myra Gianotti forin-stent stenosis.  He also has known left carotid occlusion in the setting of moderate right-sided stenosis.  09-07-17 aortogramby Dr. Wardell Honour significant renal artery stenosis. The infrarenal abdominal aorta is patent throughout its course. Bilateral common and external iliac arteries are patent; patent right superficial femoral artery stent; flush occlusion of left superficial femoral artery.No intervention  Previously,Dr. Brabhamwas able to recanalize his occluded right superficial femoral artery stent and place aViabahn. (09/22/2016). On 12/08/2016 he underwent atherectomy and angioplasty of a 80% right posterior tibial artery stenosis. On 04/20/2017 he was found to have ostial stenosis of the stent within his right superficial femoral artery. This was treated with drug-coated balloon angioplasty.  He had a CABG in 2010 that included LIMA to LAD. Left radial artery was used as one of the a bypass grafts.  He had a stress test done 01-05-18 at Dr. Hazle Coca office; pt states that there is no change since the previous stress test.   He is treated at the wound care center at [facility] other week, statesHH visits twice/week formedicated compression dressingchanges to right lower leg for 2 years, stopped by his insurance company per pt.   He reports left shoulder rotator cuff issue, is unable to raise his left armabove his left shoulder.  He states he was exposed to [employer] in [place], states he was told by the [place] that his DM and vascular disease was facilitated by this.  Diabetic:Yes, A1C was 6.7 on 09-25-17 Tobacco ZOX:[id number], quit in 2016, started at age 37, but he uses smokeless tobacco, 1 can every 2-3 days.  Pt meds include: Statin :Yes Betablocker:yes ASA:no Other anticoagulants/antiplatelets:Plavix    Current Outpatient Medications  Medication Sig Dispense Refill  . albuterol (PROVENTIL) (2.5 MG/3ML) 0.083% nebulizer solution U 3 ML VIA NEB TID PRN  3  . B Complex Vitamins (B COMPLEX PO) Take 1 tablet by mouth 2 (two) times a week.     . Benzocaine (BOIL-EASE EX) Apply 1 application topically daily as needed (BOIL).    Marland Kitchen clopidogrel (PLAVIX) 75 MG tablet Take 75 mg by mouth daily.     . Cyanocobalamin (VITAMIN B-12 IJ) Inject 1 Dose as directed every 30 (thirty) days.     . empagliflozin (JARDIANCE) 10 MG TABS tablet Take 10 mg by mouth daily.    . furosemide (LASIX) 40 MG tablet Take 40 mg by mouth daily as needed for edema.    Marland Kitchen glipiZIDE (GLUCOTROL XL) 10 MG 24 hr tablet Take 10 mg by mouth 2 (two) times daily.     . Homeopathic Products (LEG CRAMP RELIEF SL) Place 2 tablets under the tongue at bedtime as needed (leg cramps).     Marland Kitchen ibuprofen (ADVIL,MOTRIN) 200 MG tablet Take 800 mg by mouth every 8 (eight) hours as needed for moderate pain.    . IRON PO Take 1 tablet by mouth 4 (four) times a week.     . metFORMIN (GLUCOPHAGE) 500 MG tablet Take 1,000 mg by mouth 2 (two) times daily with a meal.     .  metoprolol succinate (TOPROL-XL) 25 MG 24 hr tablet Take 1 tablet (25 mg total) by mouth daily. PT OVERDUE FOR OV PLEASE CALL FOR APPT #1 attempt 30 tablet 0  . NITROSTAT 0.4 MG SL tablet Take 0.4 mg by mouth every 5 (five) minutes as needed for chest pain.   0  . Polyvinyl Alcohol-Povidone (MURINE TEARS FOR DRY EYES OP) Apply 1 drop to eye 3 (three) times daily as needed (dry eyes).    . potassium chloride (K-DUR) 10 MEQ tablet Take 10 mEq  by mouth daily.    . Pseudoeph-Doxylamine-DM-APAP (NYQUIL PO) Take 1 Dose by mouth at bedtime.     . simvastatin (ZOCOR) 40 MG tablet Take 40 mg by mouth every evening.    Marland Kitchen. SPIRIVA HANDIHALER 18 MCG inhalation capsule Place 18 mcg into inhaler and inhale daily at 8 pm.     . traMADol (ULTRAM) 50 MG tablet Take 50 mg by mouth 3 (three) times daily as needed for severe pain.      No current facility-administered medications for this visit.     Past Medical History:  Diagnosis Date  . Carotid artery disease (HCC)    L-ICA 100%, mod R-ICA dz  . COPD (chronic obstructive pulmonary disease) (HCC)   . Coronary artery disease    s/p CABG February 2010 by Dr. Andrey SpearmanSteve Hendrickson  . Diabetes (HCC)   . Emphysema lung (HCC)   . Hyperlipidemia   . Hypertension   . Peripheral arterial disease (HCC)    post left common iliac and right SFA stenting remotely  . Tobacco abuse         Social History Social History   Tobacco Use  . Smoking status: Light Tobacco Smoker    Packs/day: 0.00    Types: Cigarettes    Last attempt to quit: 08/07/2016    Years since quitting: 2.9  . Smokeless tobacco: Current User    Types: Chew  Substance Use Topics  . Alcohol use: No    Alcohol/week: 0.0 standard drinks    Comment: Weekend drinker, quit 2000. NIGHTLY nyquill  . Drug use: No    Family History Family History  Problem Relation Age of Onset  . Heart attack Mother   . CVA Mother   . Heart disease Mother   . Diabetes Sister   . Hypertension Sister     Surgical History Past Surgical History:  Procedure Laterality Date  . ABDOMINAL AORTOGRAM N/A 04/20/2017   Procedure: ABDOMINAL AORTOGRAM;  Surgeon: Nada LibmanBrabham, Vance W, MD;  Location: [facility];  Service: Cardiovascular;  Laterality: N/A;  . ABDOMINAL AORTOGRAM W/LOWER EXTREMITY Right 12/08/2016   Procedure: Abdominal Aortogram w/Lower Extremity;  Surgeon: Nada LibmanVance W Brabham, MD;  Location: [facility];  Service: Cardiovascular;   Laterality: Right;  . ABDOMINAL AORTOGRAM W/LOWER EXTREMITY N/A 09/07/2017   Procedure: ABDOMINAL AORTOGRAM W/LOWER EXTREMITY;  Surgeon: Nada LibmanBrabham, Vance W, MD;  Location: [facility];  Service: Cardiovascular;  Laterality: N/A;  . CARDIAC CATHETERIZATION  2010  . CAROTID ANGIOGRAM  2014  . CORONARY ARTERY BYPASS GRAFT  2010   LIMA-LAD, Lrad-OM1, SVG-RI, SVG-AM-dRCA  . DOPPLER ECHOCARDIOGRAPHY  2010  . LOWER EXTREMITY ANGIOGRAM  11/2006   left common femoral endarterectomy and patch angioplasty:The mid right SFA was angioplastied with a 4 x 8  . LOWER EXTREMITY ANGIOGRAM  2003   left common iliac artery stenting by Dr. Erlene QuanJ. Berry  . LOWER EXTREMITY ANGIOGRAPHY Right 04/20/2017   Procedure: Lower Extremity Angiography;  Surgeon: Nada Libman, MD;  Location: [facility];  Service: Cardiovascular;  Laterality: Right;  . NM MYOVIEW LTD  2015  . PERIPHERAL VASCULAR ATHERECTOMY Right 12/08/2016   Procedure: Peripheral Vascular Atherectomy;  Surgeon: Nada Libman, MD;  Location: [facility];  Service: Cardiovascular;  Laterality: Right;  . PERIPHERAL VASCULAR BALLOON ANGIOPLASTY Right 12/08/2016   Procedure: Peripheral Vascular Balloon Angioplasty;  Surgeon: Nada Libman, MD;  Location: [facility];  Service: Cardiovascular;  Laterality: Right;  . PERIPHERAL VASCULAR BALLOON ANGIOPLASTY Right 04/20/2017   Procedure: PERIPHERAL VASCULAR BALLOON ANGIOPLASTY;  Surgeon: Nada Libman, MD;  Location: [facility];  Service: Cardiovascular;  Laterality: Right;  SFA  . PERIPHERAL VASCULAR CATHETERIZATION N/A 09/22/2016   Procedure: Abdominal Aortogram w/ bilateral Lower Extremity Runoff;  Surgeon: Nada Libman, MD;  Location: [facility];  Service: Cardiovascular;  Laterality: N/A;  . PERIPHERAL VASCULAR CATHETERIZATION Right 09/22/2016   Procedure: Peripheral Vascular Intervention;  Surgeon: Nada Libman, MD;  Location: [facility];  Service:  Cardiovascular;  Laterality: Right;    Allergies  Allergen Reactions  . Actos [Pioglitazone] Shortness Of Breath    Leg swelling   . Lisinopril Cough    Current Outpatient Medications  Medication Sig Dispense Refill  . albuterol (PROVENTIL) (2.5 MG/3ML) 0.083% nebulizer solution U 3 ML VIA NEB TID PRN  3  . B Complex Vitamins (B COMPLEX PO) Take 1 tablet by mouth 2 (two) times a week.     . Benzocaine (BOIL-EASE EX) Apply 1 application topically daily as needed (BOIL).    Marland Kitchen clopidogrel (PLAVIX) 75 MG tablet Take 75 mg by mouth daily.     . Cyanocobalamin (VITAMIN B-12 IJ) Inject 1 Dose as directed every 30 (thirty) days.     . empagliflozin (JARDIANCE) 10 MG TABS tablet Take 10 mg by mouth daily.    . furosemide (LASIX) 40 MG tablet Take 40 mg by mouth daily as needed for edema.    Marland Kitchen glipiZIDE (GLUCOTROL XL) 10 MG 24 hr tablet Take 10 mg by mouth 2 (two) times daily.     . Homeopathic Products (LEG CRAMP RELIEF SL) Place 2 tablets under the tongue at bedtime as needed (leg cramps).     Marland Kitchen ibuprofen (ADVIL,MOTRIN) 200 MG tablet Take 800 mg by mouth every 8 (eight) hours as needed for moderate pain.    . IRON PO Take 1 tablet by mouth 4 (four) times a week.     . metFORMIN (GLUCOPHAGE) 500 MG tablet Take 1,000 mg by mouth 2 (two) times daily with a meal.     . metoprolol succinate (TOPROL-XL) 25 MG 24 hr tablet Take 1 tablet (25 mg total) by mouth daily. PT OVERDUE FOR OV PLEASE CALL FOR APPT #1 attempt 30 tablet 0  . NITROSTAT 0.4 MG SL tablet Take 0.4 mg by mouth every 5 (five) minutes as needed for chest pain.   0  . Polyvinyl Alcohol-Povidone (MURINE TEARS FOR DRY EYES OP) Apply 1 drop to eye 3 (three) times daily as needed (dry eyes).    . potassium chloride (K-DUR) 10 MEQ tablet Take 10 mEq by mouth daily.    . Pseudoeph-Doxylamine-DM-APAP (NYQUIL PO) Take 1 Dose by mouth at bedtime.     . simvastatin (ZOCOR) 40 MG tablet Take 40 mg by mouth every evening.    Marland Kitchen SPIRIVA HANDIHALER 18  MCG inhalation capsule Place 18 mcg into inhaler and inhale daily  at 8 pm.     . traMADol (ULTRAM) 50 MG tablet Take 50 mg by mouth 3 (three) times daily as needed for severe pain.      No current facility-administered medications for this visit.      REVIEW OF SYSTEMS: See HPI for pertinent positives and negatives.  Physical Examination Vitals:   08/02/19 1211  BP: (!) 159/63  Pulse: 83  Resp: 20  Temp: (!) 97.2 F (36.2 C)  SpO2: 97%  Weight: 127 lb (57.6 kg)  Height:  (1.549 m)   Body mass index is 24 kg/m.  General:  Small male in NAD Gait: using cane, slow, steady HEENT: no gross abnormalities. Edentulous  Pulmonary: normal non-labored breathing, good air movement in all fields, no rales, rhonchi, or wheezes Cardiac: RRR, no murmur detected Abdomen: soft, NT, no masses palpated Skin: no rashes, venous stasis ulcer right anterior lower leg  VASCULAR EXAM  Carotid Bruits Right Left   Negative Negative      Radial pulses are palpable bilaterally   Adominal aortic pulse is not palpable                      VASCULAR EXAM: Extremities without ischemic changes, without Gangrene; with open wound, venous stasis ulcer at dorsal aspect right ankle has healed, right toes are fairly pink.  No open wounds on left leg. Right lower leg wound at anterior aspect, dressed by the wound care center and pt Thick long toenails                                                                                                           LE Pulses Right Left       FEMORAL  1+ palpable  1+ palpable        POPLITEAL  not palpable   not palpable       POSTERIOR TIBIAL  not palpable   not palpable        DORSALIS PEDIS      ANTERIOR TIBIAL not palpable  not palpable     Musculoskeletal: no muscle wasting or atrophy; no peripheral edema  Neurologic:  A&O X 3; appropriate affect, sensation is normal; speech is normal, CN 2-12 intact, pain and light touch intact in extremities,  motor exam as listed above. Psychiatric: Normal thought content except pt spoke of alien spaceship taking him and his dogs at some point, mood appropriate to clinical situation.     DATA  Carotid Duplex (08-02-19): Right Carotid: Velocities in the right ICA are consistent with a 60-79%                stenosis. Non-hemodynamically significant plaque <50% noted in                the CCA. The ECA appears <50% stenosed. Left Carotid: Evidence consistent with a total occlusion of the left ICA.               Non-hemodynamically significant plaque <50% noted in the CCA. The  ECA appears <50% stenosed. Vertebrals:  Bilateral vertebral arteries demonstrate antegrade flow. Subclavians: Normal flow hemodynamics were seen in bilateral subclavian arteries.  No change compared to the exams on 04-15-17 and 01-06-18.   Aortoiliac Duplex (08-02-19): Abdominal Aorta Findings: +-------------+----------+---------+---------------------------------+ Location     PSV (cm/s)Waveform Comments                          +-------------+----------+---------+---------------------------------+ Supraceliac                     Not Visualized; Obscured by bowel +-------------+----------+---------+---------------------------------+ Proximal                        Not Visualized; Obscured by bowel +-------------+----------+---------+---------------------------------+ Mid                             Not Visualized; Obscured by bowel +-------------+----------+---------+---------------------------------+ Distal                          Not Visualized; Obscured by bowel +-------------+----------+---------+---------------------------------+ RT CIA Prox                     Not Visualized; Obscured by bowel +-------------+----------+---------+---------------------------------+ RT CIA Mid   114       triphasic                                   +-------------+----------+---------+---------------------------------+ RT CIA Distal148       triphasic                                  +-------------+----------+---------+---------------------------------+ RT EIA Prox  152       triphasic                                  +-------------+----------+---------+---------------------------------+ RT EIA Mid   187       triphasic                                  +-------------+----------+---------+---------------------------------+ RT EIA Distal178       triphasic                                  +-------------+----------+---------+---------------------------------+ LT CIA Prox                     Not Visualized; Obscured by bowel +-------------+----------+---------+---------------------------------+ LT CIA Mid   197       biphasic                                   +-------------+----------+---------+---------------------------------+ LT CIA Distal213       biphasic                                   +-------------+----------+---------+---------------------------------+ LT EIA Prox  222       biphasic                                   +-------------+----------+---------+---------------------------------+  LT EIA Mid   214       biphasic                                   +-------------+----------+---------+---------------------------------+ LT EIA Distal157       biphasic                                   +-------------+----------+---------+---------------------------------+ Visualization of the [place] artery, Proximal Abdominal Aorta, Mid Abdominal Aorta, Right CIA Proximal artery, Distal Abdominal Aorta and Left CIA Proximal artery was limited.  Summary: Stenosis: +--------------------+-------------+------------------+ Location            Stenosis     Comments           +--------------------+-------------+------------------+ Prox Aorta                       Not visualized      +--------------------+-------------+------------------+ Mid Aorta                        Not Visualized     +--------------------+-------------+------------------+ Distal Aorta                     Not Visualized     +--------------------+-------------+------------------+ Right Common Iliac  <50% stenosis                   +--------------------+-------------+------------------+ Left Common Iliac   >50% stenosisLow end of range   +--------------------+-------------+------------------+ Right External Iliac<50% stenosisUpper end of range +--------------------+-------------+------------------+ Left External Iliac >50% stenosisLow end of range   +--------------------+-------------+------------------+ The proximal through distal aorta, proximal right common iliac artery, and proximal left common iliac artery are not visualized on this exam due to overlying bowel gas.    Right LE Arterial Duplex(08-02-19): +-----------+--------+---------------+----------+--------+ RIGHT      PSV cm/sStenosis       Waveform  Comments +-----------+--------+---------------+----------+--------+ CFA Prox   252     50-74% stenosisbiphasic           +-----------+--------+---------------+----------+--------+ CFA Mid    229     50-74% stenosisbiphasic           +-----------+--------+---------------+----------+--------+ CFA Distal 188                    biphasic  PST      +-----------+--------+---------------+----------+--------+ DFA        69                     monophasic         +-----------+--------+---------------+----------+--------+ SFA Prox                                    Stent    +-----------+--------+---------------+----------+--------+ SFA Mid                                     Stent    +-----------+--------+---------------+----------+--------+ SFA Distal                                  Stent     +-----------+--------+---------------+----------+--------+ POP Prox   64  biphasic           +-----------+--------+---------------+----------+--------+ POP Mid    74                     biphasic           +-----------+--------+---------------+----------+--------+ POP Distal 70                     biphasic           +-----------+--------+---------------+----------+--------+ TP Trunk   32                     biphasic           +-----------+--------+---------------+----------+--------+ ATA Distal 32                     monophasic         +-----------+--------+---------------+----------+--------+ PTA Distal 29                     biphasic           +-----------+--------+---------------+----------+--------+ PERO Distal23                     monophasic         +-----------+--------+---------------+----------+--------+ Right Stent(s): +--------------------------+--------+--------+----------+--------+ Superficial Femoral ArteryPSV cm/sStenosisWaveform  Comments +--------------------------+--------+--------+----------+--------+ Prox to Stent             180             monophasicPST      +--------------------------+--------+--------+----------+--------+ Proximal Stent            195             monophasicPST      +--------------------------+--------+--------+----------+--------+ Mid Stent                 71              biphasic           +--------------------------+--------+--------+----------+--------+ Distal Stent              73              biphasic           +--------------------------+--------+--------+----------+--------+ Distal to Stent           64              biphasic           +--------------------------+--------+--------+----------+--------+ Summary: Right: 50-74% stenosis noted in the common femoral artery. Patent stent with no evidence of stenosis in the right superficial femoral  artery artery. Highest PSV is at the DFA (278 cm/s).   Right LE Arterial Duplex(12/21/17): Patent right leg stent with no focal stenosis. Irregular plaque is seen in the popliteal artery with no focal stenosis.  Waveforms are triphasic proximal to stent, biphasic at proximal stent, and monophasic to distal stent.    ABI Findings (05-03-19): +---------+------------------+-----+----------+--------+ Right    Rt Pressure (mmHg)IndexWaveform  Comment  +---------+------------------+-----+----------+--------+ Brachial 173                                       +---------+------------------+-----+----------+--------+ ATA      100               0.58 monophasic         +---------+------------------+-----+----------+--------+ PTA      106  0.61 monophasic         +---------+------------------+-----+----------+--------+ Great Toe80                0.46                    +---------+------------------+-----+----------+--------+  +---------+------------------+-----+-------------------+-------+ Left     Lt Pressure (mmHg)IndexWaveform           Comment +---------+------------------+-----+-------------------+-------+ Brachial 171                                               +---------+------------------+-----+-------------------+-------+ ATA      62                0.36 dampened monophasic        +---------+------------------+-----+-------------------+-------+ PTA      80                0.46 monophasic                 +---------+------------------+-----+-------------------+-------+ Great Toe53                0.31                            +---------+------------------+-----+-------------------+-------+  +-------+-----------+-----------+------------+------------+ ABI/TBIToday's ABIToday's TBIPrevious ABIPrevious TBI +-------+-----------+-----------+------------+------------+ Right  0.61       0.46       0.7          0.3          +-------+-----------+-----------+------------+------------+ Left   0.46       0.31       0.57        0.29         +-------+-----------+-----------+------------+------------+ Previous ABI on 12/21/17.  Summary: Right: Resting right ankle-brachial index indicates moderate right lower extremity arterial disease. The right toe-brachial index is abnormal. RT great toe pressure = 80 mmHg.  Left: Resting left ankle-brachial index indicates severe left lower extremity arterial disease. The left toe-brachial index is abnormal. LT Great toe pressure = 53 mmHg.     ASSESSMENT:  Reginald Ayers is a 68 y.o. male whois s/p drug coated balloon angioplasty ofright superficial femoral artery, and balloon angioplastyofright common femoral artery on 04-20-17 by Dr. Trula Slade forin-stent stenosis. The patienthaspreviously undergone percutaneous revascularization.  Bilateral femoral pulses are 1+ palpable, but pt does not sem to have claudication sx's with walking; bilateral EIA and CIA were patent on January 2019 aortogram. Right EIA and CIA are triphasic, left are biphasic. Most recent ABI's were September 2020 which showed a mild decline bilaterally: 0.61 on the right with monophasic waveforms, 0.46 on the left with monophasic and dampened monophasic waveforms.   Carotid duplex today shows 60-79% stenosis in the right ICA and occlusion in the left ICA. No change compared to the exams on 04-15-17 and 01-06-18.   Venous stasis ulcer right lower leg under treatment by [facility] wound care center, slow to heal.  No ischemic changes in feet.   Long thick toenails and PAD: referral to [facility]   PLAN:  The patient was counseled re smokeless tobaccocessation and given several free resources re this.  Based on the patient's vascular studies and examination, pt will return to clinic insix months with right LE arterial duplex, bilateral aortoiliac duplex, ABI's, and  carotid duplex, see Dr. Trula Slade  at pt request.   I advised him to notify us if he develops concerns re the circulation in his feet or legs.   Continue venous stasis ulcers treatment by [facility] care center.  I discussed in depth with the patient the nature of atherosclerosis, and emphasized the importance of maximal medical management including strict control of blood pressure, blood glucose, and lipid levels, obtaining regular exercise, and cessation of smoking.  The patient is aware that without maximal medical management the underlying atherosclerotic disease process will progress, limiting the benefit of any interventions.  The patient was given information about stroke prevention and what symptoms should prompt the patient to seek immediate medical care.  The patient was given information about PAD including signs, symptoms, treatment, what symptoms should prompt the patient to seek immediate medical care, and risk reduction measures to take.  Thank you for allowing [place] to participate in this patient's care.  Charisse March, RN, MSN, FNP-C [facility] Office: [phone]  Clinic MD: [facility] 08/02/2019 12:50 PM

## 2019-08-02 NOTE — Patient Instructions (Addendum)
Smokeless Tobacco Information, Adult  Tobacco is a leafy plant that contains a chemical (nicotine). Nicotine affects your brain and causes you to become addicted to it. Smokeless tobacco is tobacco that you put in your mouth instead of smoking it. It may also be called chewing tobacco or snuff. Smokeless tobacco is made from the leaves of tobacco plants and comes in many forms, such as:  Loose, dry leaves.  Plugs or twists.  Moist pouches.  Dissolving lozenges or strips. Chewing, sucking, or holding the tobacco in your mouth causes your mouth to make more saliva. The saliva mixes with the tobacco, which you swallow or spit out. Using tobacco is harmful to your health. How can smokeless tobacco affect me? All forms of tobacco contain many chemicals that can harm every organ in the body. Using smokeless tobacco increases your risk for:  Cancer. Tobacco use is one of the leading causes of cancer. Smokeless tobacco is linked to cancer of the mouth, esophagus, and pancreas.  Other long-term health problems, including high blood pressure, heart disease, and stroke.  Addiction.  Pregnancy complications, if this applies. Pregnant women who use smokeless tobacco are more likely to have a miscarriage or deliver a baby too early.  Mouth and dental problems, such as: ? Bad breath. ? Teeth that look yellow or brown. ? Mouth sores. ? Cracking and bleeding lips. ? Gum recession, gum disease, or tooth decay. ? Lesions on the soft tissues of your mouth (leukoplakia). The benefits of not using smokeless tobacco include:  A healthy mind and body.  Saving money. You avoid the cost of buying tobacco and the cost of treating illnesses that are caused by tobacco. What actions can I take to quit using tobacco? Ask your health care provider for help to quit using smokeless tobacco. This may involve treatment. These tips may also help you quit:  Pick a date to quit. Set a date within the next two  weeks. This gives you time to prepare.  Write down the reasons why you are quitting. Keep this list in places where you will see it often, such as on your bathroom mirror or in your car or wallet.  Identify the people, places, things, and activities that make you want to use smokeless tobacco (triggers). Avoid them.  Get rid of any tobacco you have and remove any tobacco smells. To do this: ? Throw away all containers of tobacco at home, at work, and in your car. ? Throw away any other items that you use regularly when you chew tobacco. ? Clean your car and make sure to remove all tobacco-related items. ? Clean your home, including curtains and carpets.  Tell your family and friends that you are quitting. Having support can make quitting easier.  Chew sugarless gum or sunflower seeds when you want to use smokeless tobacco.  Stay positive. Be prepared for cravings. It is common to slip up when you first quit, so take it one day at a time.  Stay busy and take care of your body. Get plenty of exercise. Drink enough water to keep your urine pale yellow.  Keep track of how many days have passed since you quit. Remembering how long and hard you have worked to quit can help you avoid using smokeless tobacco again. Where to find support Ask your health care provider if there is a local support group for quitting smokeless tobacco. Where to find more information You can learn more about the risks of using smokeless tobacco   and the benefits of quitting from these sources:  American Cancer Society: www.cancer.org  National Cancer Institute: www.cancer.gov  Centers for Disease Control and Prevention: [employer]Ex[employer].com.br Contact a health care provider if you have:  Trouble quitting smokeless tobacco use on your own.  White or other discolored patches in your mouth.  Difficulty swallowing.  A change in your voice.  Unexplained weight loss.  Stomach pain, nausea, or vomiting. Summary   Smokeless tobacco contains many different chemicals that are known to cause cancer.  Nicotine is an addictive chemical in smokeless tobacco that affects your brain.  The benefits of not using smokeless tobacco include having a healthy mind and body and saving money.  Tell your family and friends that you are quitting. Having support can make quitting easier.  Ask your health care provider for help quitting smokeless tobacco. This may involve treatment. This information is not intended to replace advice given to you by your health care provider. Make sure you discuss any questions you have with your health care provider. Document Released: 01/12/2011 Document Revised: 10/17/2018 Document Reviewed: 10/17/2018 Elsevier Patient Education  2020 [employer].    Stroke Prevention Some medical conditions and lifestyle choices can lead to a higher risk for a stroke. You can help to prevent a stroke by making nutrition, lifestyle, and other changes. What nutrition changes can be made?   Eat healthy foods. ? Choose foods that are high in fiber. These include:  Fresh fruits.  Fresh vegetables.  Whole grains. ? Eat at least 5 or more servings of fruits and vegetables each day. Try to fill half of your plate at each meal with fruits and vegetables. ? Choose lean protein foods. These include:  Lowfat (lean) cuts of meat.  Chicken without skin.  Fish.  Tofu.  Beans.  Nuts. ? Eat low-fat dairy products. ? Avoid foods that:  Are high in salt (sodium).  Have saturated fat.  Have trans fat.  Have cholesterol.  Are processed.  Are premade.  Follow eating guidelines as told by your doctor. These may include: ? Reducing how many calories you eat and drink each day. ? Limiting how much salt you eat or drink each day to 1,500 milligrams (mg). ? Using only healthy fats for cooking. These include:  Olive oil.  Canola oil.  Sunflower oil. ? Counting how many carbohydrates you  eat and drink each day. What lifestyle changes can be made?  Try to stay at a healthy weight. Talk to your doctor about what a good weight is for you.  Get at least 30 minutes of moderate physical activity at least 5 days a week. This can include: ? Fast walking. ? Biking. ? Swimming.  Do not use any products that have nicotine or tobacco. This includes cigarettes and e-cigarettes. If you need help quitting, ask your doctor. Avoid being around tobacco smoke in general.  Limit how much alcohol you drink to no more than 1 drink a day for nonpregnant women and 2 drinks a day for men. One drink equals 12 oz of beer, 5 oz of wine, or 1 oz of hard liquor.  Do not use drugs.  Avoid taking birth control pills. Talk to your doctor about the risks of taking birth control pills if: ? You are over 99 years old. ? You smoke. ? You get migraines. ? You have had a blood clot. What other changes can be made?  Manage your cholesterol. ? It is important to eat a healthy diet. ?  If your cholesterol cannot be managed through your diet, you may also need to take medicines. Take medicines as told by your doctor.  Manage your diabetes. ? It is important to eat a healthy diet and to exercise regularly. ? If your blood sugar cannot be managed through diet and exercise, you may need to take medicines. Take medicines as told by your doctor.  Control your high blood pressure (hypertension). ? Try to keep your blood pressure below 130/80. This can help lower your risk of stroke. ? It is important to eat a healthy diet and to exercise regularly. ? If your blood pressure cannot be managed through diet and exercise, you may need to take medicines. Take medicines as told by your doctor. ? Ask your doctor if you should check your blood pressure at home. ? Have your blood pressure checked every year. Do this even if your blood pressure is normal.  Talk to your doctor about getting checked for a sleep disorder.  Signs of this can include: ? Snoring a lot. ? Feeling very tired.  Take over-the-counter and prescription medicines only as told by your doctor. These may include aspirin or blood thinners (antiplatelets or anticoagulants).  Make sure that any other medical conditions you have are managed. Where to find more information  American Stroke Association: www.strokeassociation.org  National Stroke Association: www.stroke.org Get help right away if:  You have any symptoms of stroke. "BE FAST" is an easy way to remember the main warning signs: ? B - Balance. Signs are dizziness, sudden trouble walking, or loss of balance. ? E - Eyes. Signs are trouble seeing or a sudden change in how you see. ? F - Face. Signs are sudden weakness or loss of feeling of the face, or the face or eyelid drooping on one side. ? A - Arms. Signs are weakness or loss of feeling in an arm. This happens suddenly and usually on one side of the body. ? S - Speech. Signs are sudden trouble speaking, slurred speech, or trouble understanding what people say. ? T - Time. Time to call emergency services. Write down what time symptoms started.  You have other signs of stroke, such as: ? A sudden, very bad headache with no known cause. ? Feeling sick to your stomach (nausea). ? Throwing up (vomiting). ? Jerky movements you cannot control (seizure). These symptoms may represent a serious problem that is an emergency. Do not wait to see if the symptoms will go away. Get medical help right away. Call your local emergency services (911 in the [place] Do not drive yourself to the hospital. Summary  You can prevent a stroke by eating healthy, exercising, not smoking, drinking less alcohol, and treating other health problems, such as diabetes, high blood pressure, or high cholesterol.  Do not use any products that contain nicotine or tobacco, such as cigarettes and e-cigarettes.  Get help right away if you have any signs or symptoms  of a stroke. This information is not intended to replace advice given to you by your health care provider. Make sure you discuss any questions you have with your health care provider. Document Released: 02/09/2012 Document Revised: 10/06/2018 Document Reviewed: 11/11/2016 Elsevier Patient Education  2020 Else[employer] Inc[employer]     Peripheral Vascular Disease  Peripheral vascular disease (PVD) is a disease of the blood vessels that are not part of your heart and brain. A simple term for PVD is poor circulation. In most cases, PVD narrows the blood vessels that carry  blood from your heart to the rest of your body. This can reduce the supply of blood to your arms, legs, and internal organs, like your stomach or kidneys. However, PVD most often affects a person's lower legs and feet. Without treatment, PVD tends to get worse. PVD can also lead to acute ischemic limb. This is when an arm or leg suddenly cannot get enough blood. This is a medical emergency. Follow these instructions at home: Lifestyle  Do not use any products that contain nicotine or tobacco, such as cigarettes and e-cigarettes. If you need help quitting, ask your doctor.  Lose weight if you are overweight. Or, stay at a healthy weight as told by your doctor.  Eat a diet that is low in fat and cholesterol. If you need help, ask your doctor.  Exercise regularly. Ask your doctor for activities that are right for you. General instructions  Take over-the-counter and prescription medicines only as told by your doctor.  Take good care of your feet: ? Wear comfortable shoes that fit well. ? Check your feet often for any cuts or sores.  Keep all follow-up visits as told by your doctor This is important. Contact a doctor if:  You have cramps in your legs when you walk.  You have leg pain when you are at rest.  You have coldness in a leg or foot.  Your skin changes.  You are unable to get or have an erection (erectile dysfunction).   You have cuts or sores on your feet that do not heal. Get help right away if:  Your arm or leg turns cold, numb, and blue.  Your arms or legs become red, warm, swollen, painful, or numb.  You have chest pain.  You have trouble breathing.  You suddenly have weakness in your face, arm, or leg.  You become very confused or you cannot speak.  You suddenly have a very bad headache.  You suddenly cannot see. Summary  Peripheral vascular disease (PVD) is a disease of the blood vessels.  A simple term for PVD is poor circulation. Without treatment, PVD tends to get worse.  Treatment may include exercise, low fat and low cholesterol diet, and quitting smoking. This information is not intended to replace advice given to you by your health care provider. Make sure you discuss any questions you have with your health care provider. Document Released: 11/04/2009 Document Revised: 07/23/2017 Document Reviewed: 09/17/2016 Elsevier Patient Education  2020 [employer].     Chronic Venous Insufficiency Chronic venous insufficiency is a condition where the leg veins cannot effectively pump blood from the legs to the heart. This happens when the vein walls are either stretched, weakened, or damaged, or when the valves inside the vein are damaged. With the right treatment, you should be able to continue with an active life. This condition is also called venous stasis. What are the causes? Common causes of this condition include:  High blood pressure inside the veins (venous hypertension).  Sitting or standing too long, causing increased blood pressure in the leg veins.  A blood clot that blocks blood flow in a vein (deep vein thrombosis, DVT).  Inflammation of a vein (phlebitis) that causes a blood clot to form.  Tumors in the pelvis that cause blood to back up. What increases the risk? The following factors may make you more likely to develop this condition:  Having a family history  of this condition.  Obesity.  Pregnancy.  Living without enough regular physical activity  or exercise (sedentary lifestyle).  Smoking.  Having a job that requires long periods of standing or sitting in one place.  Being a certain age. Women in their 7040s and 3850s and men in their 4970s are more likely to develop this condition. What are the signs or symptoms? Symptoms of this condition include:  Veins that are enlarged, bulging, or twisted (varicose veins).  Skin breakdown or ulcers.  Reddened skin or dark discoloration of skin on the leg between the knee and ankle.  Brown, smooth, tight, and painful skin just above the ankle, usually on the inside of the leg (lipodermatosclerosis).  Swelling of the legs. How is this diagnosed? This condition may be diagnosed based on:  Your medical history.  A physical exam.  Tests, such as: ? A procedure that creates an image of a blood vessel and nearby organs and provides information about blood flow through the blood vessel (duplex ultrasound). ? A procedure that tests blood flow (plethysmography). ? A procedure that looks at the veins using X-ray and dye (venogram). How is this treated? The goals of treatment are to help you return to an active life and to minimize pain or disability. Treatment depends on the severity of your condition, and it may include:  Wearing compression stockings. These can help relieve symptoms and help prevent your condition from getting worse. However, they do not cure the condition.  Sclerotherapy. This procedure involves an injection of a solution that shrinks damaged veins.  Surgery. This may involve: ? Removing a diseased vein (vein stripping). ? Cutting off blood flow through the vein (laser ablation surgery). ? Repairing or reconstructing a valve within the affected vein. Follow these instructions at home:      Wear compression stockings as told by your health care provider. These stockings help  to prevent blood clots and reduce swelling in your legs.  Take over-the-counter and prescription medicines only as told by your health care provider.  Stay active by exercising, walking, or doing different activities. Ask your health care provider what activities are safe for you and how much exercise you need.  Drink enough fluid to keep your urine pale yellow.  Do not use any products that contain nicotine or tobacco, such as cigarettes, e-cigarettes, and chewing tobacco. If you need help quitting, ask your health care provider.  Keep all follow-up visits as told by your health care provider. This is important. Contact a health care provider if you:  Have redness, swelling, or more pain in the affected area.  See a red streak or line that goes up or down from the affected area.  Have skin breakdown or skin loss in the affected area, even if the breakdown is small.  Get an injury in the affected area. Get help right away if:  You get an injury and an open wound in the affected area.  You have: ? Severe pain that does not get better with medicine. ? Sudden numbness or weakness in the foot or ankle below the affected area. ? Trouble moving your foot or ankle. ? A fever. ? Worse or persistent symptoms. ? Chest pain. ? Shortness of breath. Summary  Chronic venous insufficiency is a condition where the leg veins cannot effectively pump blood from the legs to the heart.  Chronic venous insufficiency occurs when the vein walls become stretched, weakened, or damaged, or when valves within the vein are damaged.  Treatment depends on how severe your condition is. It often involves wearing compression stockings and  may involve having a procedure.  Make sure you stay active by exercising, walking, or doing different activities. Ask your health care provider what activities are safe for you and how much exercise you need. This information is not intended to replace advice given to you by  your health care provider. Make sure you discuss any questions you have with your health care provider. Document Released: 12/14/2006 Document Revised: 05/03/2018 Document Reviewed: 05/03/2018 Elsevier Patient Education  2020 [employer].

## 2019-08-06 ENCOUNTER — Other Ambulatory Visit: Payer: Self-pay | Admitting: Cardiovascular Disease

## 2019-08-14 ENCOUNTER — Ambulatory Visit (INDEPENDENT_AMBULATORY_CARE_PROVIDER_SITE_OTHER): Payer: Medicare Other | Admitting: Podiatry

## 2019-08-14 ENCOUNTER — Other Ambulatory Visit: Payer: Self-pay

## 2019-08-14 ENCOUNTER — Encounter: Payer: Self-pay | Admitting: Podiatry

## 2019-08-14 VITALS — BP 193/90 | HR 92

## 2019-08-14 DIAGNOSIS — M79674 Pain in right toe(s): Secondary | ICD-10-CM

## 2019-08-14 DIAGNOSIS — D510 Vitamin B12 deficiency anemia due to intrinsic factor deficiency: Secondary | ICD-10-CM | POA: Insufficient documentation

## 2019-08-14 DIAGNOSIS — M79675 Pain in left toe(s): Secondary | ICD-10-CM

## 2019-08-14 DIAGNOSIS — E11319 Type 2 diabetes mellitus with unspecified diabetic retinopathy without macular edema: Secondary | ICD-10-CM | POA: Insufficient documentation

## 2019-08-14 DIAGNOSIS — N1831 Chronic kidney disease, stage 3a: Secondary | ICD-10-CM | POA: Insufficient documentation

## 2019-08-14 DIAGNOSIS — I2510 Atherosclerotic heart disease of native coronary artery without angina pectoris: Secondary | ICD-10-CM | POA: Insufficient documentation

## 2019-08-14 DIAGNOSIS — I872 Venous insufficiency (chronic) (peripheral): Secondary | ICD-10-CM | POA: Insufficient documentation

## 2019-08-14 DIAGNOSIS — R809 Proteinuria, unspecified: Secondary | ICD-10-CM | POA: Insufficient documentation

## 2019-08-14 DIAGNOSIS — E119 Type 2 diabetes mellitus without complications: Secondary | ICD-10-CM

## 2019-08-14 DIAGNOSIS — B351 Tinea unguium: Secondary | ICD-10-CM

## 2019-08-14 DIAGNOSIS — E1151 Type 2 diabetes mellitus with diabetic peripheral angiopathy without gangrene: Secondary | ICD-10-CM | POA: Insufficient documentation

## 2019-08-14 DIAGNOSIS — E7800 Pure hypercholesterolemia, unspecified: Secondary | ICD-10-CM | POA: Insufficient documentation

## 2019-08-14 DIAGNOSIS — N182 Chronic kidney disease, stage 2 (mild): Secondary | ICD-10-CM | POA: Insufficient documentation

## 2019-08-14 NOTE — Patient Instructions (Addendum)
Use Vaseline lotion on your dry skin, then use Vaseline petroleum jelly over the lotion to lock in the moisture. DO NOT USE BETWEEN THE TOES    Diabetic Neuropathy Diabetic neuropathy refers to nerve damage that is caused by diabetes (diabetes mellitus). Over time, people with diabetes can develop nerve damage throughout the body. There are several types of diabetic neuropathy:  Peripheral neuropathy. This is the most common type of diabetic neuropathy. It causes damage to nerves that carry signals between the spinal cord and other parts of the body (peripheral nerves). This usually affects nerves in the feet and legs first, and may eventually affect the hands and arms. The damage affects the ability to sense touch or temperature.  Autonomic neuropathy. This type causes damage to nerves that control involuntary functions (autonomic nerves). These nerves carry signals that control: ? Heartbeat. ? Body temperature. ? Blood pressure. ? Urination. ? Digestion. ? Sweating. ? Sexual function. ? Response to changing blood sugar (glucose) levels.  Focal neuropathy. This type of nerve damage affects one area of the body, such as an arm, a leg, or the face. The injury may involve one nerve or a small group of nerves. Focal neuropathy can be painful and unpredictable, and occurs most often in older adults with diabetes. This often develops suddenly, but usually improves over time and does not cause long-term problems.  Proximal neuropathy. This type of nerve damage affects the nerves of the thighs, hips, buttocks, or legs. It causes severe pain, weakness, and muscle death (atrophy), usually in the thigh muscles. It is more common among older men and people who have type 2 diabetes. The length of recovery time may vary. What are the causes? Peripheral, autonomic, and focal neuropathies are caused by diabetes that is not well controlled with treatment. The cause of proximal neuropathy is not known, but it  may be caused by inflammation related to uncontrolled blood glucose levels. What are the signs or symptoms? Peripheral neuropathy Peripheral neuropathy develops slowly over time. When the nerves of the feet and legs no longer work, you may experience:  Burning, stabbing, or aching pain in the legs or feet.  Pain or cramping in the legs or feet.  Loss of feeling (numbness) and inability to feel pressure or pain in the feet. This can lead to: ? Thick calluses or sores on areas of constant pressure. ? Ulcers. ? Reduced ability to feel temperature changes.  Foot deformities.  Muscle weakness.  Loss of balance or coordination. Autonomic neuropathy The symptoms of autonomic neuropathy vary depending on which nerves are affected. Symptoms may include:  Problems with digestion, such as: ? Nausea or vomiting. ? Poor appetite. ? Bloating. ? Diarrhea or constipation. ? Trouble swallowing. ? Losing weight without trying to.  Problems with the heart, blood and lungs, such as: ? Dizziness, especially when standing up. ? Fainting. ? Shortness of breath. ? Irregular heartbeat.  Bladder problems, such as: ? Trouble starting or stopping urination. ? Leaking urine. ? Trouble emptying the bladder. ? Urinary tract infections (UTIs).  Problems with other body functions, such as: ? Sweat. You may sweat too much or too little. ? Temperature. You might get hot easily. Or, you might feel cold more than usual. ? Sexual function. Men may not be able to get or maintain an erection. Women may have vaginal dryness and difficulty with arousal. Focal neuropathy Symptoms affect only one area of the body. Common symptoms include:  Numbness.  Tingling.  Burning pain.  Prickling  feeling.  Very sensitive skin.  Weakness.  Inability to move (paralysis).  Muscle twitching.  Muscles getting smaller (wasting).  Poor coordination.  Double or blurred vision. Proximal neuropathy  Sudden,  severe pain in the hip, thigh, or buttocks. Pain may spread from the back into the legs (sciatica).  Pain and numbness in the arms and legs.  Tingling.  Loss of bladder control or bowel control.  Weakness and wasting of thigh muscles.  Difficulty getting up from a seated position.  Abdominal swelling.  Unexplained weight loss. How is this diagnosed? Diagnosis usually involves reviewing your medical history and any symptoms you have. Diagnosis varies depending on the type of neuropathy your health care provider suspects. Peripheral neuropathy Your health care provider will check areas that are affected by your nervous system (neurologic exam), such as your reflexes, how you move, and what you can feel. You may have other tests, such as:  Blood tests.  Removal and examination of fluid that surrounds the spinal cord (lumbar puncture).  CT scan.  MRI.  A test to check the nerves that control muscles (electromyogram, EMG).  Tests of how quickly messages pass through your nerves (nerve conduction velocity tests).  Removal of a small piece of nerve to be examined under a microscope (biopsy). Autonomic neuropathy You may have tests, such as:  Tests to measure your blood pressure and heart rate. This may include monitoring you while you are safely secured to an exam table that moves you from a lying position to an upright position (table tilt test).  Breathing tests to check your lungs.  Tests to check how food moves through the digestive system (gastric emptying tests).  Blood, sweat, or urine tests.  Ultrasound of your bladder.  Spinal fluid tests. Focal neuropathy This condition may be diagnosed with:  A neurologic exam.  CT scan.  MRI.  EMG.  Nerve conduction velocity tests. Proximal neuropathy There is no test to diagnose this type of neuropathy. You may have tests to rule out other possible causes of this type of neuropathy. Tests may include:  X-rays of  your spine and lumbar region.  Lumbar puncture.  MRI. How is this treated? The goal of treatment is to keep nerve damage from getting worse. The most important part of treatment is keeping your blood glucose level and your A1C level within your target range by following your diabetes management plan. Over time, maintaining lower blood glucose levels helps lessen symptoms. In some cases, you may need prescription pain medicine. Follow these instructions at home:  Lifestyle   Do not use any products that contain nicotine or tobacco, such as cigarettes and e-cigarettes. If you need help quitting, ask your health care provider.  Be physically active every day. Include strength training and balance exercises.  Follow a healthy meal plan.  Work with your health care provider to manage your blood pressure. General instructions  Follow your diabetes management plan as directed. ? Check your blood glucose levels as directed by your health care provider. ? Keep your blood glucose in your target range as directed by your health care provider. ? Have your A1C level checked at least two times a year, or as often as told by your health care provider.  Take over the counter and prescription medicines only as told by your health care provider. This includes insulin and diabetes medicine.  Do not drive or use heavy machinery while taking prescription pain medicines.  Check your skin and feet every day for  cuts, bruises, redness, blisters, or sores.  Keep all follow up visits as told by your health care provider. This is important. Contact a health care provider if:  You have burning, stabbing, or aching pain in your legs or feet.  You are unable to feel pressure or pain in your feet.  You develop problems with digestion, such as: ? Nausea. ? Vomiting. ? Bloating. ? Constipation. ? Diarrhea. ? Abdominal pain.  You have difficulty with urination, such as inability: ? To control when you  urinate (incontinence). ? To completely empty the bladder (retention).  You have palpitations.  You feel dizzy, weak, or faint when you stand up. Get help right away if:  You cannot urinate.  You have sudden weakness or loss of coordination.  You have trouble speaking.  You have pain or pressure in your chest.  You have an irregular heart beat.  You have sudden inability to move a part of your body. Summary  Diabetic neuropathy refers to nerve damage that is caused by diabetes. It can affect nerves throughout the entire body, causing numbness and pain in the arms, legs, digestive tract, heart, and other body systems.  Keep your blood glucose level and your blood pressure in your target range, as directed by your health care provider. This can help prevent neuropathy from getting worse.  Check your skin and feet every day for cuts, bruises, redness, blisters, or sores.  Do not use any products that contain nicotine or tobacco, such as cigarettes and e-cigarettes. If you need help quitting, ask your health care provider. This information is not intended to replace advice given to you by your health care provider. Make sure you discuss any questions you have with your health care provider. Document Released: 10/19/2001 Document Revised: 09/22/2017 Document Reviewed: 09/14/2016 [employer] Patient Education  2020 [employer]

## 2019-08-17 ENCOUNTER — Encounter (HOSPITAL_BASED_OUTPATIENT_CLINIC_OR_DEPARTMENT_OTHER): Payer: Medicare Other | Admitting: Internal Medicine

## 2019-08-21 ENCOUNTER — Other Ambulatory Visit: Payer: Self-pay

## 2019-08-21 ENCOUNTER — Encounter (HOSPITAL_BASED_OUTPATIENT_CLINIC_OR_DEPARTMENT_OTHER): Payer: Medicare Other | Admitting: Internal Medicine

## 2019-08-21 DIAGNOSIS — I872 Venous insufficiency (chronic) (peripheral): Secondary | ICD-10-CM | POA: Diagnosis not present

## 2019-08-21 DIAGNOSIS — E11621 Type 2 diabetes mellitus with foot ulcer: Secondary | ICD-10-CM | POA: Diagnosis not present

## 2019-08-21 DIAGNOSIS — L97812 Non-pressure chronic ulcer of other part of right lower leg with fat layer exposed: Secondary | ICD-10-CM | POA: Diagnosis not present

## 2019-08-21 NOTE — Progress Notes (Signed)
BRAVEN, WOLK ([id number]) Visit Report for 08/21/2019 HPI Details Patient Name: Date of Service: Reginald Ayers, Reginald Ayers 08/21/2019 12:45 PM Medical Record [id number] Patient Account Number: [id number] Date of Birth/Sex: Treating RN: [birth year] (68 y.o. M) Primary Care Provider: Katy Apo Other Clinician: Referring Provider: Treating Provider/Extender:Quina Wilbourne, [name], [name]: 180 History of Present Illness HPI Description: 03/06/16; this is a patient who is a type II diabetic with a recent hemoglobin A1c of 7.6. He is referred from Dr. Allyson Sabal for aggressive wound care on a wound on his right anterior leg. He has known severe PAD and has an extensive interventional history. His left common iliac artery was stented in December 2003 and his right superficial femoral artery in April 2008. He has significant infrapopliteal disease bilaterally and has known occluded left SFA. His ABIs from September 2016 showed a ABI of 0.48 on the right and 0.52 on the left although he does not really describe claudication. He also has known carotid artery disease, coronary artery disease and is status post CABG in 2010. He tells me he smokes 3 cigarettes a day. The patient tells me his at the wound on the right anterior leg for about 6 months. He has a smaller area on the left medial leg that has been present for one month. He's been to see his primary physician Dr. polite and Dr. Allyson Sabal I am not clear what he is been using on this. It is felt that his ulcer is an ischemic ulcer on the pretibial region. It is noted that if we are unable to heal this it was felt he would need repeat angiography and potential percutaneous revascularization. 03/13/16; the area on the left medial leg is healed over. His ischemic wound on the right anterior leg is debridement of a lot of nonviable subcutaneous tissue we've been using Santyl 03/20/16; he has no open area on the left medial leg. The  wound on the right anterior leg looks ischemic. A lot of nonviable tissue. Our intake nurse reported that he had a lot of dried blood clots around the wound presumably from last week's debridement. There is no evidence of infection 04/16/16; the area on the right anterior leg is an ischemic wound. He is been back to see Dr. Allyson Sabal who states he is not a candidate for revascularization. He has a large area of occlusion from his groin down which cannot be revascularized. Since Dr. Allyson Sabal is aggressive with this type of situation I can only surmised that that's the correct decision 05/01/16 the area on the right leg is an ischemic wound. He has not a candidate for revascularization. He has not smoked in 48 hours. Has been using Santyl wound size is not any smaller however this surface looks somewhat better 05/08/16; ischemic wound on the right anterior leg. He is not a candidate for revascularization. He continues with nonsmoking for the last week. I use Prisma last week although that probably wasn't a good decision, covered in a difficult surface eschar today requiring debridement 05/15/16; patient with ischemic wound on his right anterior leg. He is not a candidate for revascularization. Complains of a lot of pain this week/burning over the wound area making it difficult for him to sleep 05/21/16; patient states less pain this week. The wound had circumferential fibrinous slough that I removed along with some nonviable subcutaneous tissue. We used Hydrofera Blue last week 05/28/16; patient states he has not had any pain. Some minor reduction in the area. Still using  Hydrofera Blue 06/04/16; no pain still using Hydrofera Blue 06/11/16 no complaints still using Hydrofera Blue 06/18/16; I changed him to Endoform last week. 06/25/16; wound still appears healthy. Minor debridement this week still using Endoform 07/02/16; the wound bed certainly appears healthier. Rim of epithelialization superiorly was there  last week. It certainly has not progressed any further. Using Endoform 07/09/16 patient is here for a review of the wound on the right anterior leg. He has a history of severe and prolonged PAD followed by Dr. Allyson Sabal in the setting of type 2 diabetes. I have been using Endoform for the last 3 or 4 weeks. He has a rim of epithelialization superiorly but I have not been able to advance this. He complains of pain but there is no clear infection 07/15/2016 -- the patient was here for a nurse visit but due to the presence of a new wound I was asked to see him for an opinion 07/23/16; the patient was seen last week by Dr. Meyer Russel in my absence. He had a new wound on his right dorsal ankle. Santyl was applied to this new area and also his original wound on the right anterior tibia. He arrives today complaining of a lot of pain 07/30/16; the patient has new wound on his right dorsal ankle from 2 weeks ago. This is as usual covered with a thick eschar. I think this is ischemic and I'm not expecting to be able to heal this. Santyl was applied for but denied by his insurance we will therefore use Medihoney. He only obtained this later today. He has left a wrap on all week with Santyl. 08/06/16 both wounds in the same situation still a gelatinous surface slough. He finds the Medihoney makes him feel pain. He could not afford this Santyl opiate 08/20/16; patient arrives today frustrated about his nonhealing wounds now 2 on his right leg. He arrived here with a wound on the right anterior leg mid aspect and is developed a second area on the right dorsal ankle. I have reviewed Dr. Hazle Coca notes and his visit from August/17. There he makes clear reference to the fact that the vessels were not revascularizable. The patient also describes fairly clear claudication at rest which makes him sleep with his legs in a dependent position. He has given up smoking as of yesterday 09/03/16; patient is going for a second  opinion on his vascular status of vein and vascular next week. We have been using Medihoney to the wounds. He is tolerating his discomfort. 09/17/16; the patient is going to have an angiogram on January 30. Miraculously I can't really see has had a recent angiogram although he has been to see Dr. Allyson Sabal who does not think here is cerebrovascular rise visible option. He does have stents in both legs that Dr. Allyson Sabal put and I think about 9 years ago. I would like to continue to use Medihoney until we see the results of his angiogram. 10/01/16; the patient had his angiogram. He has stent previously which was dilated and then a stent was placed in the right superficial femoral artery. He had failed balloon angioplasty of the right posterior tibial artery. A further stent was placed in the superficial femoral artery. An angioplasty was also done. He was noted to have severe stenosis versus occlusion of the posterior tibial artery which is the dominant vessel across the foot. Intervention was attempted here but was unsuccessful. If the patient is unable to heal his wounds it was felt he could be  brought back for repeat attempt 10/15/16- patient is here for follow-up evaluation of his right lower extremity and right dorsal foot ulcers. He was unable to make last week's appointment secondary to a death in the family. He has been using Iodosorb/Iodoflex dressing changes per home health. He states that his primary care physician follows an A1c every 3 months, with his last A1c being 7.8 which is elevated from the previous. He states he has a scheduled A1c for next month. He also states he has a follow-up appointment with Vein and Vascular in April 10/22/16- Mr. Ogle did have an xray of the right foot, revealing no definite evidence of bone destruction to suggest osteomyelitis. This was performed at [facility]. He has been wearing his show with a rolled tongue since the last visit when he leaves the home,  otherwise he wears loose house slippers around the house. He is voicing no complaints or concerns since his last visit. He admits to smoking "maybe one a week" 10/29/16- patient is here for follow-up evaluation of his right lower extremity and right dorsal foot ulcers. He states that he has completely stopped smoking but does admit to dipping tobacco. We have not found out his coverage for both [employer] and Apligraf. He continues to offload this area with his walking shoe otherwise wears a slipper at home. He brought this (and it does appear to allow for relief of pressure to the dorsal part of his foot. 11/05/16; patient is here for follow-up of his right lower extremity and right dorsal foot ulcers. He has been revascularized. He no longer smokes. We have been using Prisma. 11/12/16- Mr. Fritsch arrives for follow up evaluation of his right lower extremity ulcers. We have been using Prisma. He is planned for an Apligraf application today. 11/19/16- Mr.Moller arrives for follow-up evaluation of his right lower extremity and right dorsal foot ulcer one week after Apligraf #1 application. He states the home health came out on Monday and change the foam, Kerlix and Coban and and they did not interrupt the Adaptic touch. He has a follow-up appointment with ultrasound on Monday with vein and vascular 11/26/16- he is here for follow-up evaluation. He is s/p Apligraf #1 application 2 weeks ago. he had an appointment with vascular on Monday for arterial ultrasound. In evaluation of the stent to the right SFA which indicates biphasic waveform proximal to the stent and at the origin of the stent otherwise monophasic flow throughout the stent and at the distal aspect of the stent. An ABI done in that office on Monday was 0.73 (previously 0.47). He has biphasic flow in the right posterior tibial and monophasic flow in the dorsalis pedis. He has a follow-up with Dr. Myra Gianotti on Monday, 4/9 12/03/16- He is here for  follow-up evaluation. He did see Dr. Myra Gianotti on Monday and has a scheduled aortogram with lower extremity arteriogram on the 17th. He was not compressed over the last week. His wound culture came back Oxacillin sensitive staph aureus. He was prescribed Keflex and has been taking that without any voiced complaints. He states that he started taking his Lasix, although he did not contact or have an appointment with Dr. Nehemiah Settle. He does have an appointment in May 12/10/16- he is here for follow-up evaluation. He had an intervention on 4/17 with Dr. Myra Gianotti. According to his dictation 1) the patient has a widely patent right SFA and popliteal stent 2) after the an 80% occlusion of the right PTA was treated with atherectomy and  balloon angioplasty with a residual stenosis of less than 5%. The patient is voicing significant improvement and overall feeling; states that he can now sleep in the bed without any symptoms. His completed his Keflex with no adverse reaction. He continues to self-regulate his Lasix, he is not on any potassium supplementation. 12/17/16; Arrives today with weeping edema thru the dorsal foot wound and lower leg. He has not been using his Lasix and did not have compression on his leg. According to the patient he has 40mg  Lasix at home which he takes prn but not in weeks 12/21/16; less edema and no weeping edema. Apligraf #1 12/31/16; both wounds look somewhat better especially the area distally. Apligraf #2 01/14/17; Apligraf #3. Wounds looked improved. Patient saw main and vascular. ABIs on 01/11/17 done in their office showed a patent SFA stent status post atherectomy with angioplasty of the right PTA [12/08/16] biphasic flow on the right 0.95 monophasic flow on the left 0.45. 01/28/17; Apligraf #4. Wounds not particularly better than I remember. 02/11/17; Apligraf #5 the wounds have some improvement. The distal 1 has had some epithelialization proximal one is somewhat smaller. 02/25/17; he  arrived in clinic today with a disappointing amount of surface necrotic material. This is especially true on the distal anterior foot/ankle wound. Change primary dressing the Hydrofera Blue today 03/04/17; some improvement in the proximal wound on the tibia. The distal wound on the dorsal foot/ankle has too much necrotic surface. We have been using Hydrofera Blue although there was apparently some problem with obtaining it through his home health company however the patient reports he now has it 03/11/17; wounds measure slightly smaller. Surface looks questionable although with the improvement in dimensions will likely continue with the [facility] without debridement today 03/18/17; no major change in these wounds. Questionable surface. Debridement today. Using [facility] 03/25/17; the patient has follow-up noninvasive vascular tests with vein and vascular later this month. The wounds himself are not measuring any different however the proximal wound bed looks better with the Iodoflex not much change with the area over the dorsal foot. 04/22/17; patient had repeat angiography on 04/20/17. He had a drug-coated balloon angioplasty of the right superficial femoral artery balloon angioplasty right common femoral artery. He had stenosis of previously placed stents within the right superficial femoral artery this was treated with drug coated balloon angioplasty. He continues to have single vessel runoff via the peroneal artery He has been using Hydrofera Blue 04/29/17; no major change this week. Wound beds appear healthy. No debridement. He apparently did not tolerate Iodoflex which I was using prior to modification. I'm going to change him to Endo form with hydrogel 05/13/17; wound beds again appear a lot better, paradoxically the wound on the dorsal foot appears to be progressing better than the proximal wound. Using Endoform with hydrogel 06/03/17; doing well. wounds smaller especially the wound on  the dorsal foot. using endoform 06/17/17; two-week follow-up everything is going really nicely here. Using Endoform he only has one small open area left on the right dorsal foot/ankle. Also improved on the right anterior leg. He is retired from smoking since starting with [place] in this clinic 07/01/17; two-week follow-up the area on his right anterior foot/ankle is just about closed. The area superiorly on the lower mid calf as adherent necrotic debris which will need debridement but it is also down in terms of dimensions. We'll use Endoform 07/19/17 on evaluation today patient appears to be doing well with his Current wound care measures  and there does not appear to be necrotic tissue which is good news. The Endoform however does seem to be causing some epithelialization although this is loose at the proximal/12 o'clock location and seems to be pulling off with dressing changes. No fevers, chills, nausea, or vomiting noted at this time. He has no significant discomfort. 08/05/17; the patient has 2 small open areas on the proximal foot and a larger wound on the right anterior leg. Change to [facility] Blue last time probably because of the hyper granulation proximally 08/26/17; the patient has 2 small open areas on the proximal foot wound which is closed. The other is covered with a thin surface eschar. There is a similar wound on the right anterior leg. We've been using Hydrofera Blue 09/09/17*; the patient went for angiogram with Dr. Trula Slade. Apparently a routine scheduled arterial duplex suggested occlusion of his stent however with the angiography the stent was widely patent. No procedure was necessary. He also apparently has had a long-standing pressure area on the left buttock which dates back to prior to Thanksgiving although he's never really said anything to anybody about this. He is been attempting to treated himself with triple antibiotic cream etc. We've been using silver collagen to the  wounds on his right leg and dorsal right foot. These are largely unchanged today 09/23/17; patient's wounds are smaller in both areas and are just about closed. We have been using silver collagen he shows me an area on his buttock that is in the gluteal fold. This would be an atypical place for a pressure sore like a herpetic lesion although he says it's been there for a long time 2/14/19the patient's wound is totally closed. The area on the dorsal foot is also close to closing. I looked again at the area on his left buttock in the gluteal fold. He tells me that this is been there for 6 months. This more looks more like a pressure ulcer to me than it did when I looked at this 2 weeks ago. I simply put topical antibiotics on this but we are going to have to change to a more formal dressing 10/13/17 on evaluation today patient appears to be doing very well in regard to his right dorsal foot wound. He has been tolerating the dressing changes without complication. Fortunately there does not appear to be any evidence of infection which is good news. He continues to have a left gluteal ulcer as well as he has been tolerating the Prisma dressing on although this is not significantly smaller it does sound like he's been putting other things on this as well including some kind of frame he in fact this order another cream as well although I'm not exactly sure what this is. It's something that he "researched on the Internet that is supposed to make My wound healed him seven days." At worst his pain is a 2/10 in regard to the left gluteal region but this is mainly with cleansing of the wound. 10/21/17; his right dorsal foot wound is just about closed. The area on the right anterior shin area is closed. He has a left gluteal ulcer which is probably a pressure ulcer. He is been using his own creams that he spot on the Internet. This wound is also better. 11/04/17; this is a patient with severe PAD who is been  revascularized by vein and vascular. He had 2 difficult wound areas one on the right anterior shin and the right anterior dorsal foot. More recently he  has developed a left gluteal ulcer which is probably a pressure ulcer although it has some atypical nature to it. His proximal right leg has remained healed. He has a small open area on the right dorsal foot with a nonviable surface 11/18/17; this is a patient with severe PAD who is been revascularized by vein and vascular. The area on the right proximal leg has healed over. He has a very small ischemic wound on the right dorsal foot which was the second wound. He also had a pressure ulcer on his buttock which is healed out as of today 12/02/17; wound on the dorsal foot which I think is largely ischemic. He is upset with me for doing a surface debridement on this last visit however he still has a nonviable surface on this and I think this is largely a blood flow issue 12/16/17; wound on the dorsal foot which I think is largely ischemic. He sees Dr. Quilcene SinkBraden ham again next week. We've been using Iodoflex to this not much of a healthy surface. 12/30/17; the patient went back to see vascular surgery. He had noninvasive testing. His ABI on the right was 0.70 compared to 0.60 last time on the left 0.57 compared to 0.44. TBI's were 0.3 on the right 0.29 on the left essentially unchanged from last time. Waveforms were monophasic. He was seen by a provider. Noted nonpalpable pulses. Overall they felt that they had slightly improved bilateral ABIs and right TBI. Her disease bilaterally with all waveforms monophasic. They will follow his lower extremity arterial duplex in 6 months. They're calling the areas on his feet venous stasis ulcers. He does not clearly describe claudication 01/13/18; The patient's wound is slightly larger in width and nonviable surface. We've been using Iodoflex. He has home help changing the dressings. I have been reluctant to do  debridement on this since he blames a debridement for reopening his wound actually still think this is arterial even given recent arterial results and evaluation [see above) 01/27/18; using iodoflex. better surface. 02/10/18; using Iodoflex. Surface is some better. Changed him to silver collagen today 03/02/18 on evaluation today patient continues to show signs of slow but good progress in regard to his left lower extremity ulcer. He has been tolerating the dressing changes without complication which is good news. He does have somewhat limited/poor vascular flow but nonetheless he does seem to be healing just at a much slower place and what would be otherwise recommended and expected. I do believe the silver collagen dressing change has been of benefit. 03/22/18; slow but good progress in terms of his right anterior foot wound. These are largely ischemic wounds but he has been revascularized. He is continued with silver collagen 04/07/18 on evaluation today patient appears to be doing rather well in regard to the wound on the dorsal surface of his right foot. He's been tolerating the dressing changes without complication. Fortunately there does not appear to be the evidence of infection at this time which is great news. Overall I'm very pleased with how things seem to appear at this point. 04/21/18; dorsal right foot wound secondary to PAD. Wound is making progress by 2 mm. Surface of it is passable. He is been using silver collagen 05/05/18; we received a call from home health this week that the original proximal tibial area had reopened on the right. He now has 2 open areas including the one we've been watching for a long period on the foot. The new open area seems to be  right in the same place as the original wound previously was located 05/13/2018; wounds are both actually larger. I debrided these last week both wounds are bigger. The surface looks better I been using Iodoflex. 05/27/2018; the wounds  are larger but the surface of them looks better with Iodoflex. He has a follow-up with Dr. Myra Gianotti sometime in November. I tried to move these up we were not able to get an earlier appointment I will try to text him. 06/16/2018; the patient's wounds are larger but with a generally cleaner surface. He has extensive set of investigations with Dr. Myra Gianotti on November 5. This includes ABIs TBI's and I believe an ultrasound of the stent. 07/04/2018; the patient apparently stated he felt too dizzy to go for his arterial studies on 11/5 therefore he canceled him. He has not been able to rebook them. I suspect these are ischemic wounds and I told him this. 09/01/2018; he is not been here in almost 2 months. He has been getting his dressing changed by [facility] I believe silver collagen. They are discharging him next week due to nonimprovement of the wounds predominantly. He did not follow-up with vascular surgery he was supposed to have follow-up arterial studies noninvasive. I am not sure that they have been rebooked and I do not think he has been seen. 1/23; patient's wounds are slightly larger. I believe this is ischemic. He canceled his last follow-up appointment with Dr. Myra Gianotti him that included noninvasive arterial studies. He says this was because of vertigo. He has not rescheduled these. He also has lost home health because of the chronicity of his wounds 2/6; the patient's wound on his right anterior tibia actually looks quite good. Not much difference in size but the surface of the wound looks good. He has a small area on the dorsal foot debris on the surface of this which I debrided with Anasept and gauze. He has not made a follow-up with vein and vascular 2/20; the patient's wound on his right anterior tibia continues to look good although it is not coming down in size much. The area on his right dorsal foot is about the same. Tightly adherent debris. The wound surface does not look healthy  care. The patient is expressed his to sustain for mechanical debridement in the past, I therefore been avoiding this at least until he goes back for repeat vascular appointment. So far he has not done this he is mingling for ENT appointments related to vertigo 3/5; right anterior tibia continues to look satisfactory perhaps slightly smaller. The area on the right dorsal foot is smaller. We have been using Hydrofera Blue. 4/16; the patient wounds have actually done well in the hiatus since we have last seen him. He is using Hydrofera Blue ready 6/25; we have not seen this patient in over 2 months as he has been in the isolation phase for the Covid epidemic. He is using Hydrofera Blue ready. He has 2 wounds right on the right anterior mid tibia and one on the right dorsal foot. Both of these are somewhat improved today. Especially the dorsal 1 over the tibia. He has not been for follow- up arterial studies but I have got him to agree to allow [place] to set these up. I 7/16; the patient's wound proximally in the mid tibia area has closed over once again. We had this closed over before and he reopened the area on the dorsal foot appears better. He has been using Hydrofera Blue. We do not  have an appointment for the follow-up arterial studies I ordered nor do I think he is really all that interested in getting them done. 7/30; patient has been using Hydrofera Blue. The dorsal foot wound looks some better however he has had a reopening in the mid tibia area. This is been closed over the last time. The opening is very superficial. We have been trying to get him follow-up vascular studies done with vein and vascular for about a year. He refused to do these at one point because he was at work getting worked up for vertigo. He is now more ready to move forward with this. 8/20; patient is using Hydrofera Blue. He has not made an appointment for his follow-up noninvasive vascular studies and I have talked to him  about this again. Both wound areas are still open on the right mid tibia and the right dorsal foot although I think they are smaller 05/04/19-Patient is back after his vascular visit his studies revealed that he is got ABI of 0.6 on the right, 0.46 on the left, monophasic flow to both right and left anterior tibial arteries is got great pressures to the right great toe. 10/8; we still do not have a vascular surgery appointment 1 month later. I think he may require a repeat angiogram his wounds on the right anterior to mid tibia is quite a bit larger the area on the right dorsal ankle, is small. Very little open area here 11/12; patient has not been here in over a month. He still did not have a vascular surgery appointment however before he left the clinic today we managed to arrange that for 12/9. The area on his right dorsal foot is surprisingly closed over today however the area in the mid tibia is quite a bit larger than when we saw this last time. 12/3; patient has a vascular appointment next week in follow-up from his previous revascularization. The area on the right dorsal foot remains closed. He still has a fairly sizable wound on the anterior tibia but it is superficial. We have been using silver collagen 12/28. The patient saw Langston Reusing the nurse practitioner at [facility]. Noted that the most recent ABIs were in September 2020 which showed a mild decline bilaterally a 0.61 on the right with monophasic waveforms and 0.46 on the left with monophasic waveforms. It was felt that the area on his right lower leg was a venous ulcer. The area on the dorsal foot remains closed. Follow-up ABIs were ordered for 6 months. We have been ordering silver collagen Electronic Signature(s) Signed: 08/21/2019 6:10:19 PM By: Baltazar Najjar MD Entered By: Baltazar Najjar on 08/21/2019 14:12:13 -------------------------------------------------------------------------------- Physical Exam  Details Patient Name: Date of Service: Reginald Ayers 08/21/2019 12:45 PM Medical Record [id number] Patient Account Number: [id number] Date of Birth/Sex: Treating RN: 09/22/1950 (68 y.o. M) Primary Care Provider: Katy Apo Other Clinician: Referring Provider: Treating Provider/Extender:Keiri Solano, [name], [name]: 180 Constitutional Sitting or standing Blood Pressure is within target range for patient.. Pulse regular and within target range for patient.Marland Kitchen Respirations regular, non-labored and within target range.. Temperature is normal and within the target range for the patient.Marland Kitchen Appears in no distress. Eyes Conjunctivae clear. No discharge.no icterus. Respiratory work of breathing is normal. Cardiovascular Pedal pulses are difficult to feel. Psychiatric appears at normal baseline. Notes In the right. Popliteal pulses not palpable either. Tibial wound debrided with Anasept and gauze. There are 3 areas of epithelialization. No evidence of  surrounding infection Electronic Signature(s) Signed: 08/21/2019 6:10:19 PM By: Baltazar Najjar MD Entered By: Baltazar Najjar on 08/21/2019 14:15:06 -------------------------------------------------------------------------------- Physician Orders Details Patient Name: Date of Service: SONIA, STICKELS 08/21/2019 12:45 PM Medical Record [id number] Patient Account Number: [id number] Date of Birth/Sex: Treating RN: [birth year] (68 y.o. [name] Primary Care Provider: Katy Apo Other Clinician: Referring Provider: Treating Provider/Extender:Faustine Tates, [name], [name]: 180 Verbal / Phone Orders: No Diagnosis Coding ICD-10 Coding Code Description [phone] Non-pressure chronic ulcer of right calf with necrosis of muscle E11.622 Type 2 diabetes mellitus with other skin ulcer E11.621 Type 2 diabetes mellitus with foot ulcer E11.51 Type 2 diabetes mellitus with  diabetic peripheral angiopathy without gangrene Follow-up Appointments Return appointment in 3 weeks. Dressing Change Frequency Wound #5R Right,Anterior Lower Leg Other: - twice a week Skin Barriers/Peri-Wound Care Barrier cream - apply zinc oxide around the wound bed for wetness, reddness, or maceration. Moisturizing lotion - to leg Wound Cleansing Wound #5R Right,Anterior Lower Leg May shower with protection. Primary Wound Dressing Wound #5R Right,Anterior Lower Leg Collagen - moisten with hydrogel Secondary Dressing Wound #5R Right,Anterior Lower Leg Foam Border - or bordered gauze. Edema Control Avoid standing for long periods of time Elevate legs to the level of the heart or above for 30 minutes daily and/or when sitting, a frequency of: - throughout the day Other: - patient to purchase support stockings for protection. Off-Loading Turn and reposition every 2 hours Electronic Signature(s) Signed: 08/21/2019 6:10:06 PM By: Zandra Abts RN, BSN Signed: 08/21/2019 6:10:19 PM By: Baltazar Najjar MD Entered By: Zandra Abts on 08/21/2019 14:09:23 -------------------------------------------------------------------------------- Problem List Details Patient Name: Date of Service: LOYALTY, BRASHIER 08/21/2019 12:45 PM Medical Record [id number] Patient Account Number: [id number] Date of Birth/Sex: Treating RN: [birth year] (68 y.o. [name] Primary Care Provider: Katy Apo Other Clinician: Referring Provider: Treating Provider/Extender:Jams Trickett, [name], [name]: 180 Active Problems ICD-10 Evaluated Encounter Code Description Active Date Today Diagnosis L97.213 Non-pressure chronic ulcer of right calf with necrosis 03/06/2016 No Yes of muscle E11.622 Type 2 diabetes mellitus with other skin ulcer 03/06/2016 No Yes E11.621 Type 2 diabetes mellitus with foot ulcer 11/19/2016 No Yes E11.51 Type 2 diabetes mellitus with diabetic  peripheral 03/06/2016 No Yes angiopathy without gangrene Inactive Problems ICD-10 Code Description Active Date Inactive Date L97.512 Non-pressure chronic ulcer of other part of right foot with fat 03/03/2018 03/03/2018 layer exposed Resolved Problems ICD-10 Code Description Active Date Resolved Date L97.221 Non-pressure chronic ulcer of left calf limited to breakdown of 03/06/2016 03/06/2016 skin L97.312 Non-pressure chronic ulcer of right ankle with fat layer 07/15/2016 07/15/2016 exposed L89.322 Pressure ulcer of left buttock, stage 2 09/09/2017 09/09/2017 Electronic Signature(s) Signed: 08/21/2019 6:10:19 PM By: Baltazar Najjar MD Entered By: Baltazar Najjar on 08/21/2019 14:10:24 -------------------------------------------------------------------------------- Progress Note Details Patient Name: Date of Service: Reginald Ayers 08/21/2019 12:45 PM Medical Record [id number] Patient Account Number: [id number] Date of Birth/Sex: Treating RN: 1951-03-02 (68 y.o. M) Primary Care Provider: Katy Apo Other Clinician: Referring Provider: Treating Provider/Extender:Alexandria Shiflett, [name], [name]: 180 Subjective History of Present Illness (HPI) 03/06/16; this is a patient who is a type II diabetic with a recent hemoglobin A1c of 7.6. He is referred from Dr. Allyson Sabal for aggressive wound care on a wound on his right anterior leg. He has known severe PAD and has an extensive interventional history. His left common iliac artery was stented in December 2003 and his right superficial  femoral artery in April 2008. He has significant infrapopliteal disease bilaterally and has known occluded left SFA. His ABIs from September 2016 showed a ABI of 0.48 on the right and 0.52 on the left although he does not really describe claudication. He also has known carotid artery disease, coronary artery disease and is status post CABG in 2010. He tells me he smokes 3 cigarettes a  day. The patient tells me his at the wound on the right anterior leg for about 6 months. He has a smaller area on the left medial leg that has been present for one month. He's been to see his primary physician Dr. polite and Dr. Allyson Sabal I am not clear what he is been using on this. It is felt that his ulcer is an ischemic ulcer on the pretibial region. It is noted that if we are unable to heal this it was felt he would need repeat angiography and potential percutaneous revascularization. 03/13/16; the area on the left medial leg is healed over. His ischemic wound on the right anterior leg is debridement of a lot of nonviable subcutaneous tissue we've been using Santyl 03/20/16; he has no open area on the left medial leg. The wound on the right anterior leg looks ischemic. A lot of nonviable tissue. Our intake nurse reported that he had a lot of dried blood clots around the wound presumably from last week's debridement. There is no evidence of infection 04/16/16; the area on the right anterior leg is an ischemic wound. He is been back to see Dr. Allyson Sabal who states he is not a candidate for revascularization. He has a large area of occlusion from his groin down which cannot be revascularized. Since Dr. Allyson Sabal is aggressive with this type of situation I can only surmised that that's the correct decision 05/01/16 the area on the right leg is an ischemic wound. He has not a candidate for revascularization. He has not smoked in 48 hours. Has been using Santyl wound size is not any smaller however this surface looks somewhat better 05/08/16; ischemic wound on the right anterior leg. He is not a candidate for revascularization. He continues with nonsmoking for the last week. I use Prisma last week although that probably wasn't a good decision, covered in a difficult surface eschar today requiring debridement 05/15/16; patient with ischemic wound on his right anterior leg. He is not a candidate for  revascularization. Complains of a lot of pain this week/burning over the wound area making it difficult for him to sleep 05/21/16; patient states less pain this week. The wound had circumferential fibrinous slough that I removed along with some nonviable subcutaneous tissue. We used Hydrofera Blue last week 05/28/16; patient states he has not had any pain. Some minor reduction in the area. Still using Hydrofera Blue 06/04/16; no pain still using Hydrofera Blue 06/11/16 no complaints still using Hydrofera Blue 06/18/16; I changed him to Endoform last week. 06/25/16; wound still appears healthy. Minor debridement this week still using Endoform 07/02/16; the wound bed certainly appears healthier. Rim of epithelialization superiorly was there last week. It certainly has not progressed any further. Using Endoform 07/09/16 patient is here for a review of the wound on the right anterior leg. He has a history of severe and prolonged PAD followed by Dr. Allyson Sabal in the setting of type 2 diabetes. I have been using Endoform for the last 3 or 4 weeks. He has a rim of epithelialization superiorly but I have not been able to advance this.  He complains of pain but there is no clear infection 07/15/2016 -- the patient was here for a nurse visit but due to the presence of a new wound I was asked to see him for an opinion 07/23/16; the patient was seen last week by Dr. Meyer Russel in my absence. He had a new wound on his right dorsal ankle. Santyl was applied to this new area and also his original wound on the right anterior tibia. He arrives today complaining of a lot of pain 07/30/16; the patient has new wound on his right dorsal ankle from 2 weeks ago. This is as usual covered with a thick eschar. I think this is ischemic and I'm not expecting to be able to heal this. Santyl was applied for but denied by his insurance we will therefore use Medihoney. He only obtained this later today. He has left a wrap on all week with  Santyl. 08/06/16 both wounds in the same situation still a gelatinous surface slough. He finds the Medihoney makes him feel pain. He could not afford this Santyl opiate 08/20/16; patient arrives today frustrated about his nonhealing wounds now o2 on his right leg. He arrived here with a wound on the right anterior leg mid aspect and is developed a second area on the right dorsal ankle. I have reviewed Dr. Hazle Coca notes and his visit from August/17. There he makes clear reference to the fact that the vessels were not revascularizable. The patient also describes fairly clear claudication at rest which makes him sleep with his legs in a dependent position. He has given up smoking as of yesterday 09/03/16; patient is going for a second opinion on his vascular status of vein and vascular next week. We have been using Medihoney to the wounds. He is tolerating his discomfort. 09/17/16; the patient is going to have an angiogram on January 30. Miraculously I can't really see has had a recent angiogram although he has been to see Dr. Allyson Sabal who does not think here is cerebrovascular rise visible option. He does have stents in both legs that Dr. Allyson Sabal put and I think about 9 years ago. I would like to continue to use Medihoney until we see the results of his angiogram. 10/01/16; the patient had his angiogram. He has stent previously which was dilated and then a stent was placed in the right superficial femoral artery. He had failed balloon angioplasty of the right posterior tibial artery. A further stent was placed in the superficial femoral artery. An angioplasty was also done. He was noted to have severe stenosis versus occlusion of the posterior tibial artery which is the dominant vessel across the foot. Intervention was attempted here but was unsuccessful. If the patient is unable to heal his wounds it was felt he could be brought back for repeat attempt 10/15/16- patient is here for follow-up evaluation of  his right lower extremity and right dorsal foot ulcers. He was unable to make last week's appointment secondary to a death in the family. He has been using Iodosorb/Iodoflex dressing changes per home health. He states that his primary care physician follows an A1c every 3 months, with his last A1c being 7.8 which is elevated from the previous. He states he has a scheduled A1c for next month. He also states he has a follow-up appointment with [facility] in April 10/22/16- Mr. Sluder did have an xray of the right foot, revealing no definite evidence of bone destruction to suggest osteomyelitis. This was performed at [facility].  He has been wearing his show with a rolled tongue since the last visit when he leaves the home, otherwise he wears loose house slippers around the house. He is voicing no complaints or concerns since his last visit. He admits to smoking "maybe one a week" 10/29/16- patient is here for follow-up evaluation of his right lower extremity and right dorsal foot ulcers. He states that he has completely stopped smoking but does admit to dipping tobacco. We have not found out his coverage for both [employer] and Apligraf. He continues to offload this area with his walking shoe otherwise wears a slipper at home. He brought this (and it does appear to allow for relief of pressure to the dorsal part of his foot. 11/05/16; patient is here for follow-up of his right lower extremity and right dorsal foot ulcers. He has been revascularized. He no longer smokes. We have been using Prisma. 11/12/16- Mr. Accomando arrives for follow up evaluation of his right lower extremity ulcers. We have been using Prisma. He is planned for an Apligraf application today. 11/19/16- Mr.Murdock arrives for follow-up evaluation of his right lower extremity and right dorsal foot ulcer one week after Apligraf #1 application. He states the home health came out on Monday and change the foam, Kerlix and Coban and and  they did not interrupt the Adaptic touch. He has a follow-up appointment with ultrasound on Monday with vein and vascular 11/26/16- he is here for follow-up evaluation. He is s/p Apligraf #1 application 2 weeks ago. he had an appointment with vascular on Monday for arterial ultrasound. In evaluation of the stent to the right SFA which indicates biphasic waveform proximal to the stent and at the origin of the stent otherwise monophasic flow throughout the stent and at the distal aspect of the stent. An ABI done in that office on Monday was 0.73 (previously 0.47). He has biphasic flow in the right posterior tibial and monophasic flow in the dorsalis pedis. He has a follow-up with Dr. Myra Gianotti on Monday, 4/9 12/03/16- He is here for follow-up evaluation. He did see Dr. Myra Gianotti on Monday and has a scheduled aortogram with lower extremity arteriogram on the 17th. He was not compressed over the last week. His wound culture came back Oxacillin sensitive staph aureus. He was prescribed Keflex and has been taking that without any voiced complaints. He states that he started taking his Lasix, although he did not contact or have an appointment with Dr. Nehemiah Settle. He does have an appointment in May 12/10/16- he is here for follow-up evaluation. He had an intervention on 4/17 with Dr. Myra Gianotti. According to his dictation 1) the patient has a widely patent right SFA and popliteal stent 2) after the an 80% occlusion of the right PTA was treated with atherectomy and balloon angioplasty with a residual stenosis of less than 5%. The patient is voicing significant improvement and overall feeling; states that he can now sleep in the bed without any symptoms. His completed his Keflex with no adverse reaction. He continues to self-regulate his Lasix, he is not on any potassium supplementation. 12/17/16; Arrives today with weeping edema thru the dorsal foot wound and lower leg. He has not been using his Lasix and did not have  compression on his leg. According to the patient he has 40mg  Lasix at home which he takes prn but not in weeks 12/21/16; less edema and no weeping edema. Apligraf #1 12/31/16; both wounds look somewhat better especially the area distally. Apligraf #2 01/14/17; Apligraf #3.  Wounds looked improved. Patient saw [facility] vascular. ABIs on 01/11/17 done in their office showed a patent SFA stent status post atherectomy with angioplasty of the right PTA [12/08/16] biphasic flow on the right 0.95 monophasic flow on the left 0.45. 01/28/17; Apligraf #4. Wounds not particularly better than I remember. 02/11/17; Apligraf #5 the wounds have some improvement. The distal 1 has had some epithelialization proximal one is somewhat smaller. 02/25/17; he arrived in clinic today with a disappointing amount of surface necrotic material. This is especially true on the distal anterior foot/ankle wound. Change primary dressing the Hydrofera Blue today 03/04/17; some improvement in the proximal wound on the tibia. The distal wound on the dorsal foot/ankle has too much necrotic surface. We have been using Hydrofera Blue although there was apparently some problem with obtaining it through his home health company however the patient reports he now has it 03/11/17; wounds measure slightly smaller. Surface looks questionable although with the improvement in dimensions will likely continue with the [facility] without debridement today 03/18/17; no major change in these wounds. Questionable surface. Debridement today. Using [facility] 03/25/17; the patient has follow-up noninvasive vascular tests with vein and vascular later this month. The wounds himself are not measuring any different however the proximal wound bed looks better with the Iodoflex not much change with the area over the dorsal foot. 04/22/17; patient had repeat angiography on 04/20/17. He had a drug-coated balloon angioplasty of the right superficial femoral artery  balloon angioplasty right common femoral artery. He had stenosis of previously placed stents within the right superficial femoral artery this was treated with drug coated balloon angioplasty. He continues to have single vessel runoff via the peroneal artery He has been using Hydrofera Blue 04/29/17; no major change this week. Wound beds appear healthy. No debridement. He apparently did not tolerate Iodoflex which I was using prior to modification. I'm going to change him to Endo form with hydrogel 05/13/17; wound beds again appear a lot better, paradoxically the wound on the dorsal foot appears to be progressing better than the proximal wound. Using Endoform with hydrogel 06/03/17; doing well. wounds smaller especially the wound on the dorsal foot. using endoform 06/17/17; two-week follow-up everything is going really nicely here. Using Endoform he only has one small open area left on the right dorsal foot/ankle. Also improved on the right anterior leg. He is retired from smoking since starting with [place] in this clinic 07/01/17; two-week follow-up the area on his right anterior foot/ankle is just about closed. The area superiorly on the lower mid calf as adherent necrotic debris which will need debridement but it is also down in terms of dimensions. We'll use Endoform 07/19/17 on evaluation today patient appears to be doing well with his Current wound care measures and there does not appear to be necrotic tissue which is good news. The Endoform however does seem to be causing some epithelialization although this is loose at the proximal/12 o'clock location and seems to be pulling off with dressing changes. No fevers, chills, nausea, or vomiting noted at this time. He has no significant discomfort. 08/05/17; the patient has 2 small open areas on the proximal foot and a larger wound on the right anterior leg. Change to [facility] Blue last time probably because of the hyper granulation proximally 08/26/17;  the patient has 2 small open areas on the proximal foot wound which is closed. The other is covered with a thin surface eschar. There is a similar wound on the right  anterior leg. We've been using Hydrofera Blue 09/09/17*; the patient went for angiogram with Dr. Myra Gianotti. Apparently a routine scheduled arterial duplex suggested occlusion of his stent however with the angiography the stent was widely patent. No procedure was necessary. He also apparently has had a long-standing pressure area on the left buttock which dates back to prior to Thanksgiving although he's never really said anything to anybody about this. He is been attempting to treated himself with triple antibiotic cream etc. We've been using silver collagen to the wounds on his right leg and dorsal right foot. These are largely unchanged today 09/23/17; patient's wounds are smaller in both areas and are just about closed. We have been using silver collagen he shows me an area on his buttock that is in the gluteal fold. This would be an atypical place for a pressure sore like a herpetic lesion although he says it's been there for a long time 2/14/19the patient's wound is totally closed. The area on the dorsal foot is also close to closing. I looked again at the area on his left buttock in the gluteal fold. He tells me that this is been there for 6 months. This more looks more like a pressure ulcer to me than it did when I looked at this 2 weeks ago. I simply put topical antibiotics on this but we are going to have to change to a more formal dressing 10/13/17 on evaluation today patient appears to be doing very well in regard to his right dorsal foot wound. He has been tolerating the dressing changes without complication. Fortunately there does not appear to be any evidence of infection which is good news. He continues to have a left gluteal ulcer as well as he has been tolerating the Prisma dressing on although this is not significantly  smaller it does sound like he's been putting other things on this as well including some kind of frame he in fact this order another cream as well although I'm not exactly sure what this is. It's something that he "researched on the Internet that is supposed to make My wound healed him seven days." At worst his pain is a 2/10 in regard to the left gluteal region but this is mainly with cleansing of the wound. 10/21/17; his right dorsal foot wound is just about closed. The area on the right anterior shin area is closed. He has a left gluteal ulcer which is probably a pressure ulcer. He is been using his own creams that he spot on the Internet. This wound is also better. 11/04/17; this is a patient with severe PAD who is been [facility] by [facility]. He had 2 difficult wound areas one on the right anterior shin and the right anterior dorsal foot. More recently he has developed a left gluteal ulcer which is probably a pressure ulcer although it has some atypical nature to it. His proximal right leg has remained healed. He has a small open area on the right dorsal foot with a nonviable surface 11/18/17; this is a patient with severe PAD who is been [facility]ascularized by [facility]. The area on the right proximal leg has healed over. He has a very small ischemic wound on the right dorsal foot which was the second wound. He also had a pressure ulcer on his buttock which is healed out as of today 12/02/17; wound on the dorsal foot which I think is largely ischemic. He is upset with me for doing a surface debridement on  this last visit however he still has a nonviable surface on this and I think this is largely a blood flow issue 12/16/17; wound on the dorsal foot which I think is largely ischemic. He sees Dr. Phelan Sink ham again next week. We've been using Iodoflex to this not much of a healthy surface. 12/30/17; the patient went back to see vascular surgery. He had noninvasive testing. His ABI on  the right was 0.70 compared to 0.60 last time on the left 0.57 compared to 0.44. TBI's were 0.3 on the right 0.29 on the left essentially unchanged from last time. Waveforms were monophasic. He was seen by a provider. Noted nonpalpable pulses. Overall they felt that they had slightly improved bilateral ABIs and right TBI. Her disease bilaterally with all waveforms monophasic. They will follow his lower extremity arterial duplex in 6 months. They're calling the areas on his feet venous stasis ulcers. He does not clearly describe claudication 01/13/18; The patient's wound is slightly larger in width and nonviable surface. We've been using Iodoflex. He has home help changing the dressings. I have been reluctant to do debridement on this since he blames a debridement for reopening his wound actually still think this is arterial even given recent arterial results and evaluation [see above) 01/27/18; using iodoflex. better surface. 02/10/18; using Iodoflex. Surface is some better. Changed him to silver collagen today 03/02/18 on evaluation today patient continues to show signs of slow but good progress in regard to his left lower extremity ulcer. He has been tolerating the dressing changes without complication which is good news. He does have somewhat limited/poor vascular flow but nonetheless he does seem to be healing just at a much slower place and what would be otherwise recommended and expected. I do believe the silver collagen dressing change has been of benefit. 03/22/18; slow but good progress in terms of his right anterior foot wound. These are largely ischemic wounds but he has been revascularized. He is continued with silver collagen 04/07/18 on evaluation today patient appears to be doing rather well in regard to the wound on the dorsal surface of his right foot. He's been tolerating the dressing changes without complication. Fortunately there does not appear to be the evidence of infection at  this time which is great news. Overall I'm very pleased with how things seem to appear at this point. 04/21/18; dorsal right foot wound secondary to PAD. Wound is making progress by 2 mm. Surface of it is passable. He is been using silver collagen 05/05/18; we received a call from home health this week that the original proximal tibial area had reopened on the right. He now has 2 open areas including the one we've been watching for a long period on the foot. The new open area seems to be right in the same place as the original wound previously was located 05/13/2018; wounds are both actually larger. I debrided these last week both wounds are bigger. The surface looks better I been using Iodoflex. 05/27/2018; the wounds are larger but the surface of them looks better with Iodoflex. He has a follow-up with Dr. Myra Gianotti sometime in November. I tried to move these up we were not able to get an earlier appointment I will try to text him. 06/16/2018; the patient's wounds are larger but with a generally cleaner surface. He has extensive set of investigations with Dr. Myra Gianotti on November 5. This includes ABIs TBI's and I believe an ultrasound of the stent. 07/04/2018; the patient apparently stated he felt  too dizzy to go for his arterial studies on 11/5 therefore he canceled him. He has not been able to rebook them. I suspect these are ischemic wounds and I told him this. 09/01/2018; he is not been here in almost 2 months. He has been getting his dressing changed by [facility] I believe silver collagen. They are discharging him next week due to nonimprovement of the wounds predominantly. He did not follow-up with vascular surgery he was supposed to have follow-up arterial studies noninvasive. I am not sure that they have been rebooked and I do not think he has been seen. 1/23; patient's wounds are slightly larger. I believe this is ischemic. He canceled his last follow-up appointment with Dr. Myra Gianotti him that  included noninvasive arterial studies. He says this was because of vertigo. He has not rescheduled these. He also has lost home health because of the chronicity of his wounds 2/6; the patient's wound on his right anterior tibia actually looks quite good. Not much difference in size but the surface of the wound looks good. He has a small area on the dorsal foot debris on the surface of this which I debrided with Anasept and gauze. He has not made a follow-up with [facility] 2/20; the patient's wound on his right anterior tibia continues to look good although it is not coming down in size much. The area on his right dorsal foot is about the same. Tightly adherent debris. The wound surface does not look healthy care. The patient is expressed his to sustain for mechanical debridement in the past, I therefore been avoiding this at least until he goes back for repeat vascular appointment. So far he has not done this he is mingling for ENT appointments related to vertigo 3/5; right anterior tibia continues to look satisfactory perhaps slightly smaller. The area on the right dorsal foot is smaller. We have been using Hydrofera Blue. 4/16; the patient wounds have actually done well in the hiatus since we have last seen him. He is using Hydrofera Blue ready 6/25; we have not seen this patient in over 2 months as he has been in the isolation phase for the Covid epidemic. He is using Hydrofera Blue ready. He has 2 wounds right on the right anterior mid tibia and one on the right dorsal foot. Both of these are somewhat improved today. Especially the dorsal 1 over the tibia. He has not been for follow- up arterial studies but I have got him to agree to allow [place] to set these up. I 7/16; the patient's wound proximally in the mid tibia area has closed over once again. We had this closed over before and he reopened the area on the dorsal foot appears better. He has been using Hydrofera Blue. We do not have  an appointment for the follow-up arterial studies I ordered nor do I think he is really all that interested in getting them done. 7/30; patient has been using Hydrofera Blue. The dorsal foot wound looks some better however he has had a reopening in the mid tibia area. This is been closed over the last time. The opening is very superficial. We have been trying to get him follow-up vascular studies done with [facility] for about a year. He refused to do these at one point because he was at work getting worked up for vertigo. He is now more ready to move forward with this. 8/20; patient is using Hydrofera Blue. He has not made an appointment for his  follow-up noninvasive vascular studies and I have talked to him about this again. Both wound areas are still open on the right mid tibia and the right dorsal foot although I think they are smaller 05/04/19-Patient is back after his vascular visit his studies revealed that he is got ABI of 0.6 on the right, 0.46 on the left, monophasic flow to both right and left anterior tibial arteries is got great pressures to the right great toe. 10/8; we still do not have a vascular surgery appointment 1 month later. I think he may require a repeat angiogram his wounds on the right anterior to mid tibia is quite a bit larger the area on the right dorsal ankle, is small. Very little open area here 11/12; patient has not been here in over a month. He still did not have a vascular surgery appointment however before he left the clinic today we managed to arrange that for 12/9. The area on his right dorsal foot is surprisingly closed over today however the area in the mid tibia is quite a bit larger than when we saw this last time. 12/3; patient has a vascular appointment next week in follow-up from his previous revascularization. The area on the right dorsal foot remains closed. He still has a fairly sizable wound on the anterior tibia but it is superficial. We  have been using silver collagen 12/28. The patient saw Langston Reusing the nurse practitioner at [facility]. Noted that the most recent ABIs were in September 2020 which showed a mild decline bilaterally a 0.61 on the right with monophasic waveforms and 0.46 on the left with monophasic waveforms. It was felt that the area on his right lower leg was a venous ulcer. The area on the dorsal foot remains closed. Follow-up ABIs were ordered for 6 months. We have been ordering silver collagen Objective Constitutional Sitting or standing Blood Pressure is within target range for patient.. Pulse regular and within target range for patient.Marland Kitchen Respirations regular, non-labored and within target range.. Temperature is normal and within the target range for the patient.Marland Kitchen Appears in no distress. Vitals Time Taken: 1:42 PM, Height: 61 in, Weight: 122 lbs, BMI: 23, Temperature: 98.7 F, Pulse: 86 bpm, Respiratory Rate: 18 breaths/min, Blood Pressure: 136/56 mmHg. Eyes Conjunctivae clear. No discharge.no icterus. Respiratory work of breathing is normal. Cardiovascular Pedal pulses are difficult to feel. Psychiatric appears at normal baseline. General Notes: In the right. Popliteal pulses not palpable either. Tibial wound debrided with Anasept and gauze. There are 3 areas of epithelialization. No evidence of surrounding infection Integumentary (Hair, Skin) Wound #5R status is Open. Original cause of wound was Gradually Appeared. The wound is located on the Right,Anterior Lower Leg. The wound measures 2cm length x 2.9cm width x 0.1cm depth; 4.555cm^2 area and 0.456cm^3 volume. There is Fat Layer (Subcutaneous Tissue) Exposed exposed. There is no tunneling or undermining noted. There is a medium amount of serosanguineous drainage noted. The wound margin is flat and intact. There is medium (34-66%) red granulation within the wound bed. There is a medium (34-66%) amount of necrotic tissue within the  wound bed including Adherent Slough. Assessment Active Problems ICD-10 Non-pressure chronic ulcer of right calf with necrosis of muscle Type 2 diabetes mellitus with other skin ulcer Type 2 diabetes mellitus with foot ulcer Type 2 diabetes mellitus with diabetic peripheral angiopathy without gangrene Plan Follow-up Appointments: Return appointment in 3 weeks. Dressing Change Frequency: Wound #5R Right,Anterior Lower Leg: Other: - twice a week Skin Barriers/Peri-Wound Care:  Barrier cream - apply zinc oxide around the wound bed for wetness, reddness, or maceration. Moisturizing lotion - to leg Wound Cleansing: Wound #5R Right,Anterior Lower Leg: May shower with protection. Primary Wound Dressing: Wound #5R Right,Anterior Lower Leg: Collagen - moisten with hydrogel Secondary Dressing: Wound #5R Right,Anterior Lower Leg: Foam Border - or bordered gauze. Edema Control: Avoid standing for long periods of time Elevate legs to the level of the heart or above for 30 minutes daily and/or when sitting, a frequency of: - throughout the day Other: - patient to purchase support stockings for protection. Off-Loading: Turn and reposition every 2 hours 1. Continue with silver collagen with border foam. 2. Calling these wounds to venous I am not sure that is totally accurate. Nevertheless I think it is a component. Certainly has very significant PAD as well Electronic Signature(s) Signed: 08/21/2019 6:10:19 PM By: Baltazar Najjar MD Entered By: Baltazar Najjar on 08/21/2019 14:16:08 -------------------------------------------------------------------------------- SuperBill Details Patient Name: Date of Service: Reginald Ayers 08/21/2019 Medical Record [id number] Patient Account Number: [id number] Date of Birth/Sex: Treating RN: 09-09-1950 (68 y.o. M) Primary Care Provider: Katy Apo Other Clinician: Referring Provider: Treating Provider/Extender:Sarafina Puthoff, [name],  [name]: 180 Diagnosis Coding ICD-10 Codes Code Description [phone] Non-pressure chronic ulcer of right calf with necrosis of muscle E11.622 Type 2 diabetes mellitus with other skin ulcer E11.621 Type 2 diabetes mellitus with foot ulcer E11.51 Type 2 diabetes mellitus with diabetic peripheral angiopathy without gangrene Facility Procedures The patient participates with Medicare or their insurance follows the Medicare Facility Guidelines: CPT4 Code Description Modifier Quantity [id number] - WOUND CARE VISIT-LEV 3 EST PT 1 Physician Procedures CPT4 Code Description: [id number] - WC PHYS LEVEL 2 - EST PT ICD-10 Diagnosis Description L97.213 Non-pressure chronic ulcer of right calf with necrosis Modifier: of muscle Quantity: 1 Electronic Signature(s) Signed: 08/21/2019 6:10:06 PM By: Zandra Abts RN, BSN Signed: 08/21/2019 6:10:19 PM By: Baltazar Najjar MD Entered By: Zandra Abts on 08/21/2019 18:02:06

## 2019-08-21 NOTE — Progress Notes (Signed)
Subjective: Reginald Ayers presents today referred by Renford DillsPolite, Ronald, MD for diabetic foot evaluation.  Patient relates 30 year history of diabetes.  Patient denies any history of foot wounds.  Patient is followed by Dr. Allyson [facility]alBerry at [facility]. He has had multiple lower extremity interventions. He has h/o leg ulcers and is currently under care of [facility].  He denies any history of numbness, tingling, burning, pins/needles sensations.  Today, patient c/o of painful, discolored, thick toenails which interfere with daily activities.  Pain is aggravated when wearing enclosed shoe gear.   Past Medical History:  Diagnosis Date  . Carotid artery disease (HCC)    L-ICA 100%, mod R-ICA dz  . COPD (chronic obstructive pulmonary disease) (HCC)   . Coronary artery disease    s/p CABG February 2010 by Dr. Andrey SpearmanSteve Hendrickson  . Diabetes (HCC)   . Emphysema lung (HCC)   . Hyperlipidemia   . Hypertension   . Peripheral arterial disease (HCC)    post left common iliac and right SFA stenting remotely  . Tobacco abuse         Patient Active Problem List   Diagnosis Date Noted  . Atherosclerotic heart disease of native coronary artery without angina pectoris 08/14/2019  . Diabetic retinopathy associated with type 2 diabetes mellitus (HCC) 08/14/2019  . Pernicious anemia 08/14/2019  . Proteinuria 08/14/2019  . Pure hypercholesterolemia 08/14/2019  . Stage 2 chronic kidney disease 08/14/2019  . Type 2 diabetes with peripheral circulatory disorder, controlled (HCC) 08/14/2019  . Venous insufficiency of both lower extremities 08/14/2019  . Venous stasis dermatitis 08/14/2019  . Left ear impacted cerumen 10/03/2018  . Nasal crusting 10/03/2018  . Right-sided sensorineural hearing loss 10/03/2018  . Vertigo 10/03/2018  . Sepsis (HCC) 08/12/2017  . Diabetes mellitus type 2 in nonobese (HCC) 08/12/2017  . CAP (community acquired pneumonia) 08/12/2017  . Critical lower limb ischemia  04/08/2016  . GERD (gastroesophageal reflux disease) 07/30/2015  . Chest pain 12/06/2014  . Tobacco abuse 12/06/2014  . Hyponatremia 12/06/2014  . COPD exacerbation (HCC)   . Pain in the chest   . Hx of CABG Feb 2010 08/21/2013  . Peripheral arterial disease (HCC) 08/21/2013  . Carotid artery disease (HCC) 08/21/2013  . Diabetes (HCC) 08/21/2013  . Essential hypertension 08/21/2013  . Hyperlipidemia 08/21/2013    Past Surgical History:  Procedure Laterality Date  . ABDOMINAL AORTOGRAM N/A 04/20/2017   Procedure: ABDOMINAL AORTOGRAM;  Surgeon: Nada LibmanBrabham, Vance W, MD;  Location: [facility];  Service: Cardiovascular;  Laterality: N/A;  . ABDOMINAL AORTOGRAM W/LOWER EXTREMITY Right 12/08/2016   Procedure: Abdominal Aortogram w/Lower Extremity;  Surgeon: Nada LibmanVance W Brabham, MD;  Location: [facility];  Service: Cardiovascular;  Laterality: Right;  . ABDOMINAL AORTOGRAM W/LOWER EXTREMITY N/A 09/07/2017   Procedure: ABDOMINAL AORTOGRAM W/LOWER EXTREMITY;  Surgeon: Nada LibmanBrabham, Vance W, MD;  Location: [facility];  Service: Cardiovascular;  Laterality: N/A;  . CARDIAC CATHETERIZATION  2010  . CAROTID ANGIOGRAM  2014  . CORONARY ARTERY BYPASS GRAFT  2010   LIMA-LAD, Lrad-OM1, SVG-RI, SVG-AM-dRCA  . DOPPLER ECHOCARDIOGRAPHY  2010  . LOWER EXTREMITY ANGIOGRAM  11/2006   left common femoral endarterectomy and patch angioplasty:The mid right SFA was angioplastied with a 4 x 8  . LOWER EXTREMITY ANGIOGRAM  2003   left common iliac artery stenting by Dr. Erlene QuanJ. Berry  . LOWER EXTREMITY ANGIOGRAPHY Right 04/20/2017   Procedure: Lower Extremity Angiography;  Surgeon: Nada LibmanBrabham, Vance W, MD;  Location: [facility];  Service: Cardiovascular;  Laterality: Right;  . NM MYOVIEW LTD  2015  . PERIPHERAL VASCULAR ATHERECTOMY Right 12/08/2016   Procedure: Peripheral Vascular Atherectomy;  Surgeon: Nada Libman, MD;  Location: [facility];  Service: Cardiovascular;  Laterality: Right;  .  PERIPHERAL VASCULAR BALLOON ANGIOPLASTY Right 12/08/2016   Procedure: Peripheral Vascular Balloon Angioplasty;  Surgeon: Nada Libman, MD;  Location: [facility];  Service: Cardiovascular;  Laterality: Right;  . PERIPHERAL VASCULAR BALLOON ANGIOPLASTY Right 04/20/2017   Procedure: PERIPHERAL VASCULAR BALLOON ANGIOPLASTY;  Surgeon: Nada Libman, MD;  Location: [facility];  Service: Cardiovascular;  Laterality: Right;  SFA  . PERIPHERAL VASCULAR CATHETERIZATION N/A 09/22/2016   Procedure: Abdominal Aortogram w/ bilateral Lower Extremity Runoff;  Surgeon: Nada Libman, MD;  Location: [facility];  Service: Cardiovascular;  Laterality: N/A;  . PERIPHERAL VASCULAR CATHETERIZATION Right 09/22/2016   Procedure: Peripheral Vascular Intervention;  Surgeon: Nada Libman, MD;  Location: [facility];  Service: Cardiovascular;  Laterality: Right;    Current Outpatient Medications on File Prior to Visit  Medication Sig Dispense Refill  . albuterol (PROVENTIL) (2.5 MG/3ML) 0.083% nebulizer solution U 3 ML VIA NEB TID PRN  3  . B Complex Vitamins (B COMPLEX PO) Take 1 tablet by mouth 2 (two) times a week.     . Benzocaine (BOIL-EASE EX) Apply 1 application topically daily as needed (BOIL).    Marland Kitchen clopidogrel (PLAVIX) 75 MG tablet Take 75 mg by mouth daily.     . Cyanocobalamin (VITAMIN B-12 IJ) Inject 1 Dose as directed every 30 (thirty) days.     . empagliflozin (JARDIANCE) 10 MG TABS tablet Take 10 mg by mouth daily.    . furosemide (LASIX) 40 MG tablet Take 40 mg by mouth daily as needed for edema.    Marland Kitchen glipiZIDE (GLUCOTROL XL) 10 MG 24 hr tablet Take 10 mg by mouth 2 (two) times daily.     . Homeopathic Products (LEG CRAMP RELIEF SL) Place 2 tablets under the tongue at bedtime as needed (leg cramps).     Marland Kitchen ibuprofen (ADVIL,MOTRIN) 200 MG tablet Take 800 mg by mouth every 8 (eight) hours as needed for moderate pain.    . IRON PO Take 1 tablet by mouth 4 (four) times a  week.     . metFORMIN (GLUCOPHAGE) 500 MG tablet Take 1,000 mg by mouth 2 (two) times daily with a meal.     . metoprolol succinate (TOPROL-XL) 25 MG 24 hr tablet Take 1 tablet (25 mg total) by mouth daily. 30 tablet 10  . NITROSTAT 0.4 MG SL tablet Take 0.4 mg by mouth every 5 (five) minutes as needed for chest pain.   0  . Polyvinyl Alcohol-Povidone (MURINE TEARS FOR DRY EYES OP) Apply 1 drop to eye 3 (three) times daily as needed (dry eyes).    . potassium chloride (K-DUR) 10 MEQ tablet Take 10 mEq by mouth daily.    . Pseudoeph-Doxylamine-DM-APAP (NYQUIL PO) Take 1 Dose by mouth at bedtime.     . simvastatin (ZOCOR) 40 MG tablet Take 40 mg by mouth every evening.    Marland Kitchen SPIRIVA HANDIHALER 18 MCG inhalation capsule Place 18 mcg into inhaler and inhale daily at 8 pm.     . traMADol (ULTRAM) 50 MG tablet Take 50 mg by mouth 3 (three) times daily as needed for severe pain.      No current facility-administered  medications on file prior to visit.     Allergies  Allergen Reactions  . Actos [Pioglitazone] Shortness Of Breath    Leg swelling   . Lisinopril Cough    Social History   Occupational History  . Occupation: Retired Radiographer, therapeutic  Tobacco Use  . Smoking status: Light Tobacco Smoker    Packs/day: 0.00    Types: Cigarettes    Last attempt to quit: 08/07/2016    Years since quitting: 3.0  . Smokeless tobacco: Current User    Types: Chew  Substance and Sexual Activity  . Alcohol use: No    Alcohol/week: 0.0 standard drinks    Comment: Weekend drinker, quit 2000. NIGHTLY nyquill  . Drug use: No  . Sexual activity: Not on file    Family History  Problem Relation Age of Onset  . Heart attack Mother   . CVA Mother   . Heart disease Mother   . Diabetes Sister   . Hypertension Sister     Immunization History  Administered Date(s) Administered  . Influenza Split 06/14/2009, 06/26/2011, 08/22/2012, 08/25/2012, 06/14/2013  . Influenza, High Dose Seasonal PF 07/05/2015, 06/22/2016   . Influenza-Unspecified 06/06/2014  . Pneumococcal Conjugate-13 12/21/2014  . Pneumococcal Polysaccharide-23 09/25/2003, 06/14/2013  . Td 07/13/2007  . Tdap 04/14/2012  . Zoster 03/28/2012    Review of systems: Positive Findings in bold print.  Constitutional:  chills, fatigue, fever, sweats, weight change Communication: Nurse, learning disability, sign Presenter, broadcasting, hand writing, iPad/Android device Head: headaches, head injury Eyes: changes in vision, eye pain, glaucoma, cataracts, macular degeneration, diplopia, glare,  light sensitivity, eyeglasses or contacts, blindness Ears nose mouth throat: hearing impaired, hearing aids,  ringing in ears, deaf, sign language,  vertigo, nosebleeds,  rhinitis,  cold sores, snoring, swollen glands Cardiovascular: HTN, edema, arrhythmia, pacemaker in place, defibrillator in place, chest pain/tightness, chronic anticoagulation, blood clot, heart failure, MI, LE edema  Peripheral Vascular: leg cramps, varicose veins, blood clots, lymphedema, varicosities, venous stasis Respiratory:  difficulty breathing, denies congestion, SOB, wheezing, cough, emphysema Gastrointestinal: change in appetite or weight, abdominal pain, constipation, diarrhea, nausea, vomiting, vomiting blood, change in bowel habits, abdominal pain, jaundice, rectal bleeding, hemorrhoids, GERD Genitourinary:  nocturia,  pain on urination, polyuria,  blood in urine, Foley catheter, urinary urgency, ESRD on hemodialysis Musculoskeletal: amputation, cramping, stiff joints, painful joints, decreased joint motion, fractures, OA, gout, hemiplegia, paraplegia, uses cane, wheelchair bound, uses walker, uses rollator Skin: +changes in toenails, color change, dryness, itching, mole changes,  rash, wound(s) Neurological: headaches, numbness in feet, paresthesias in feet, burning in feet, fainting,  seizures, change in speech,  headaches, memory problems/poor historian, cerebral palsy, weakness, paralysis, CVA,  TIA Endocrine: diabetes, hypothyroidism, hyperthyroidism,  goiter, dry mouth, flushing, heat intolerance,  cold intolerance,  excessive thirst, denies polyuria,  nocturia Hematological:  easy bleeding, excessive bleeding, easy bruising, enlarged lymph nodes, on long term blood thinner, history of past tranfusions Allergy/immunological:  hives, eczema, frequent infections, multiple drug allergies, seasonal allergies, transplant recipient, multiple food allergies Psychiatric:  anxiety, depression, mood disorder, suicidal ideations, hallucinations, insomnia  Objective: Vitals:   08/14/19 1412  BP: (!) 193/90  Pulse: 92   Vascular Examination: Capillary refill time to digits <3 seconds.   Dorsalis pedis pulses palpable b/l.  Posterior tibial pulses palpable b/l.  Digital hair absent x 10 digits.  Skin temperature gradient WNL b/l.  Venous stasis with edema b/l LE.  Dermatological Examination: Skin thin, shiny and atrophic b/l.  Dressing noted right leg. Clean, dry and intact.   Toenails  1-5 b/l discolored, thick, dystrophic with subungual debris and pain with palpation to nailbeds due to thickness of nails.  Musculoskeletal: Muscle strength 5/5 to all LE muscle groups b/l.  Neurological: Sensation intact 5/5 b/l with 10 gram monofilament.  Vibratory sensation intact b/l.  Assessment: 1. Painful onychomycosis toenails 1-5 b/l  2. NIDDM with PAD  Plan: 1. Discussed diabetic foot care principles. Literature dispensed on today. 2. Toenails 1-5 b/l were debrided in length and girth without iatrogenic bleeding. 3. Patient to continue soft, supportive shoe gear daily. 4. Patient to report any pedal injuries to medical professional immediately. 5. Follow up 3 months.  6. Patient/POA to call should there be a concern in the interim.

## 2019-08-21 NOTE — Progress Notes (Addendum)
[name], [name] ([id number]) Visit Report for 08/21/2019 Arrival Information Details Patient Name: Date of Service: Reginald Ayers, Reginald Ayers 08/21/2019 12:45 PM Medical Record [id number] Patient Account Number: [id number] Date of Birth/Sex: Treating RN: 10-27-1950 (68 y.o. Reginald Ayers) Carlene Coria Primary Care Farooq Petrovich: Kandice Hams Other Clinician: Referring Jamilee Lafosse: Treating Jaquawn Saffran/Extender:Robson, Alice Reichert, Lowella Dandy in Treatment: 180 Visit Information History Since Last Visit All ordered tests and consults were completed: No Patient Arrived: Ambulatory Added or deleted any medications: No Arrival Time: 13:41 Any new allergies or adverse reactions: No Accompanied By: self Had a fall or experienced change in No Transfer Assistance: None activities of daily living that may affect Patient Identification Verified: Yes risk of falls: Secondary Verification Process Yes Signs or symptoms of abuse/neglect since last No Completed: visito Patient Requires Transmission- No Hospitalized since last visit: No Based Precautions: Implantable device outside of the clinic excluding No Patient Has Alerts: Yes cellular tissue based products placed in the center Patient Alerts: Patient on Blood since last visit: Thinner Has Dressing in Place as Prescribed: Yes Pain Present Now: No Electronic Signature(s) Signed: 08/21/2019 5:45:39 PM By: Carlene Coria RN Entered By: Carlene Coria on 08/21/2019 13:42:08 -------------------------------------------------------------------------------- Clinic Level of Care Assessment Details Patient Name: Date of Service: Reginald Ayers, Reginald Ayers 08/21/2019 12:45 PM Medical Record [id number] Patient Account Number: [id number] Date of Birth/Sex: Treating RN: 11/22/1950 (68 y.o. Reginald Ayers Primary Care Silvino Selman: Kandice Hams Other Clinician: Referring Roselie Cirigliano: Treating Eloyce Bultman/Extender:Robson, Alice Reichert, Lowella Dandy in Treatment:  180 Clinic Level of Care Assessment Items TOOL 4 Quantity Score X - Use when only an EandM is performed on FOLLOW-UP visit 1 0 ASSESSMENTS - Nursing Assessment / Reassessment X - Reassessment of Co-morbidities (includes updates in patient status) 1 10 X - Reassessment of Adherence to Treatment Plan 1 5 ASSESSMENTS - Wound and Skin Assessment / Reassessment X - Simple Wound Assessment / Reassessment - one wound 1 5 []  - Complex Wound Assessment / Reassessment - multiple wounds 0 []  - Dermatologic / Skin Assessment (not related to wound area) 0 ASSESSMENTS - Focused Assessment []  - Circumferential Edema Measurements - multi extremities 0 []  - Nutritional Assessment / Counseling / Intervention 0 X - Lower Extremity Assessment (monofilament, tuning fork, pulses) 1 5 []  - Peripheral Arterial Disease Assessment (using hand held doppler) 0 ASSESSMENTS - Ostomy and/or Continence Assessment and Care []  - Incontinence Assessment and Management 0 []  - Ostomy Care Assessment and Management (repouching, etc.) 0 PROCESS - Coordination of Care X - Simple Patient / Family Education for ongoing care 1 15 []  - Complex (extensive) Patient / Family Education for ongoing care 0 X - Staff obtains Programmer, systems, Records, Test Results / Process Orders 1 10 []  - Staff telephones HHA, Nursing Homes / Clarify orders / etc 0 []  - Routine Transfer to another Facility (non-emergent condition) 0 []  - Routine Hospital Admission (non-emergent condition) 0 []  - New Admissions / Biomedical engineer / Ordering NPWT, Apligraf, etc. 0 []  - Emergency Hospital Admission (emergent condition) 0 X - Simple Discharge Coordination 1 10 []  - Complex (extensive) Discharge Coordination 0 PROCESS - Special Needs []  - Pediatric / Minor Patient Management 0 []  - Isolation Patient Management 0 []  - Hearing / Language / Visual special needs 0 []  - Assessment of Community assistance (transportation, D/C planning, etc.) 0 []  -  Additional assistance / Altered mentation 0 []  - Support Surface(s) Assessment (bed, cushion, seat, etc.) 0 INTERVENTIONS - Wound Cleansing / Measurement X - Simple Wound  Cleansing - one wound 1 5 []  - Complex Wound Cleansing - multiple wounds 0 X - Wound Imaging (photographs - any number of wounds) 1 5 []  - Wound Tracing (instead of photographs) 0 X - Simple Wound Measurement - one wound 1 5 []  - Complex Wound Measurement - multiple wounds 0 INTERVENTIONS - Wound Dressings X - Small Wound Dressing one or multiple wounds 1 10 []  - Medium Wound Dressing one or multiple wounds 0 []  - Large Wound Dressing one or multiple wounds 0 X - Application of Medications - topical 1 5 []  - Application of Medications - injection 0 INTERVENTIONS - Miscellaneous []  - External ear exam 0 []  - Specimen Collection (cultures, biopsies, blood, body fluids, etc.) 0 []  - Specimen(s) / Culture(s) sent or taken to Lab for analysis 0 []  - Patient Transfer (multiple staff / / Similar devices) 0 []  - Simple Staple / Suture removal (25 or less) 0 []  - Complex Staple / Suture removal (26 or more) 0 []  - Hypo / Hyperglycemic Management (close monitor of Blood Glucose) 0 []  - Ankle / Brachial Index (ABI) - do not check if billed separately 0 X - Vital Signs 1 5 Has the patient been seen at the hospital within the last three years: Yes Total Score: 95 Level Of Care: New/Established - Level 3 Electronic Signature(s) Signed: 08/21/2019 6:10:06 PM By: RN, BSN Entered By: on 08/21/2019 18:01:56 -------------------------------------------------------------------------------- Encounter Discharge Information Details Patient Name: Date of Service: 08/21/2019 12:45 PM Medical Record Patient Account Number: Date of Birth/Sex: Treating RN: 03/06/1951 (68 y.o. Primary Care Eura Mccauslin: Other  Clinician: Referring Ismail Graziani: Treating Halea Lieb/Extender:Robson, , in Treatment: 180 Encounter Discharge Information Items Discharge Condition: Stable Ambulatory Status: Cane Discharge Destination: Home Transportation: Private Auto Accompanied By: self Schedule Follow-up Appointment: Yes Clinical Summary of Care: Electronic Signature(s) Signed: 08/21/2019 5:44:52 PM By: Zandra Abts Entered By: Zandra Abts on 08/21/2019 14:18:19 -------------------------------------------------------------------------------- Lower Extremity Assessment Details Patient Name: Date of Service: Reginald Ayers, Reginald Ayers 08/21/2019 12:45 PM Medical Record [id number] Patient Account Number: [id number] Date of Birth/Sex: Treating RN: [birth year] (68 y.o. [name]) Katy Apo Primary Care Anajulia Leyendecker: [name] Other Clinician: Referring Duvan Mousel: Treating Luvern Mcisaac/Extender:Robson, Chalmers Guest, 08/23/2019 Weeks in Treatment: 180 Edema Assessment Assessed: [Left: No] [Right: No] Edema: [Left: Ye] [Right: s] Calf Left: Right: Point of Measurement: 30 cm From Medial Instep cm 29 cm Ankle Left: Right: Point of Measurement: 10 cm From Medial Instep cm 18 cm Electronic Signature(s) Signed: 08/21/2019 5:45:39 PM By: Shawn Stall RN Entered By: 08/23/2019 on 08/21/2019 13:42:57 -------------------------------------------------------------------------------- Multi Wound Chart Details Patient Name: Date of Service: 08/23/2019 08/21/2019 12:45 PM Medical Record [id number] Patient Account Number: 01/06/1951 Date of Birth/Sex: Treating RN: June 07, 1951 (68 y.o. 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] Comorbid History: [5R:Chronic sinus problems/congestion, Chronic Obstructive Pulmonary Disease (COPD), Coronary Artery Disease, Hypertension, Peripheral Arterial Disease, Type II Diabetes, Received Chemotherapy] [N/A:N/A] Date Acquired: [5R:04/21/2018] [N/A:N/A] Weeks of Treatment: [5R:67] [N/A:N/A] Wound Status: [5R:Open] [N/A:N/A] Wound Recurrence: [5R:Yes] [N/A:N/A] Measurements L x  W x D 2x2.9x0.1 [N/A:N/A] (cm) Area (cm) : [5R:4.555] [N/A:N/A] Volume (cm) : [5R:0.456] [N/A:N/A] % Reduction in Area: [5R:-383.50%] [N/A:N/A] % Reduction in Volume: -385.10% [N/A:N/A] Classification: [5R:Grade 2] [N/A:N/A] Exudate Amount: [5R:Medium] [N/A:N/A] Exudate Type: [5R:Serosanguineous] [N/A:N/A] Exudate Color: [5R:red, brown] [N/A:N/A] Wound Margin: [5R:Flat and Intact] [N/A:N/A] Granulation Amount: [5R:Medium (34-66%)] [N/A:N/A] Granulation Quality: [5R:Red] [N/A:N/A] Necrotic Amount: [5R:Medium (34-66%)] [N/A:N/A] Exposed Structures: [5R:Fat Layer (Subcutaneous N/A Tissue) Exposed: Yes Fascia: No Tendon: No Muscle: No Joint: No Bone: No Small (1-33%)] [N/A:N/A] Treatment Notes Electronic Signature(s) Signed: 08/21/2019 6:10:19 PM By: Baltazar Najjar MD Entered By: Baltazar Najjar on 08/21/2019 14:10:33 -------------------------------------------------------------------------------- Multi-Disciplinary Care Plan Details Patient Name: Date of Service: Reginald Ayers. 08/21/2019 12:45 PM Medical Record [id number] Patient Account Number: [id number] Date of Birth/Sex: Treating RN: 1951/05/18 (68 y.o. [name] Primary Care Lasaundra Riche: Katy Apo Other Clinician: Referring Jayvien Rowlette: Treating Lacharles Altschuler/Extender:Robson,  [name], [name]: 180 Active Inactive Nutrition Nursing Diagnoses: Potential for alteratiion in Nutrition/Potential for imbalanced nutrition Goals: Patient/caregiver agrees to and verbalizes understanding of need to obtain nutritional consultation Date Initiated: 03/09/2019 Target Resolution Date: 09/22/2019 Goal Status: Active Interventions: Provide education on nutrition Treatment Activities: Education provided on Nutrition : 07/27/2019 Patient referred to Primary Care Physician for further nutritional evaluation : 03/09/2019 Notes: Electronic Signature(s) Signed: 08/21/2019 6:10:06 PM By: Zandra Abts RN, BSN Entered By: Zandra Abts on 08/21/2019 18:01:22 -------------------------------------------------------------------------------- Pain Assessment Details Patient Name: Date of Service: Reginald Ayers 08/21/2019 12:45 PM Medical Record [id number] Patient Account Number: [id number] Date of Birth/Sex: Treating RN: 1951-07-09 (68 y.o. Reginald Ayers) Yevonne Pax Primary Care Destony Prevost: Katy Apo Other Clinician: Referring Arizona Nordquist: Treating Jerred Zaremba/Extender:Robson, [name], Deirdre Peer Weeks in Treatment: 180 Active Problems Location of Pain Severity and Description of Pain Patient Has Paino No Site Locations Pain Management and Medication Current Pain Management: Electronic Signature(s) Signed: 08/21/2019 5:45:39 PM By: Yevonne Pax RN Entered By: Yevonne Pax on 08/21/2019 13:42:38 -------------------------------------------------------------------------------- Patient/Caregiver Education Details Patient Name: Date of Service: Reginald Ayers 12/28/2020andnbsp12:45 PM Medical Record Patient Account Number: [id number] Number: Treating RN: Zandra Abts Date of Birth/Gender: [birth year] (68 y.o. Other Clinician: M) Treating Baltazar Najjar Primary Care Physician: Katy Apo Physician/Extender: Referring  Physician: Renee Ramus in Treatment: 180 Education Assessment Education Provided To: Patient Education Topics Provided Wound/Skin Impairment: Methods: Explain/Verbal Responses: State content correctly Electronic Signature(s) Signed: 08/21/2019 6:10:06 PM By: Zandra Abts RN, BSN Entered By: Zandra Abts on 08/21/2019 18:01:33 -------------------------------------------------------------------------------- Wound Assessment Details Patient Name: Date of Service: Reginald Ayers 08/21/2019 12:45 PM Medical Record [id number] Patient Account Number: [id number] Date of Birth/Sex: Treating RN: May 08, 1951 (68 y.o. Reginald Ayers) Yevonne Pax Primary Care Arling Cerone: Katy Apo Other Clinician: Referring Soleia Badolato: Treating Marius Betts/Extender:Robson, [name], Deirdre Peer Weeks in Treatment: 180 Wound Status Wound Number: 5R Primary Diabetic Wound/Ulcer of the Lower Extremity Etiology: Wound Location: Right Lower Leg - Anterior Wound Open Wounding Event: Gradually Appeared Status: Date Acquired: 04/21/2018 Comorbid Chronic sinus problems/congestion, Chronic Weeks Of Treatment: 67 History: Obstructive Pulmonary Disease (COPD), Clustered Wound: No Coronary Artery Disease, Hypertension, Peripheral Arterial Disease, Type II Diabetes, Received Chemotherapy Photos Wound Measurements Length: (cm) 2 Width: (cm) 2.9 Depth: (cm) 0.1 Area: (cm) 4.555 Volume: (cm) 0.456 Wound Description Classification: Grade 2 Wound Margin: Flat and Intact Exudate Amount: Medium Exudate Type: Serosanguineous Exudate Color: red, brown Wound Bed Granulation Amount: Medium (34-66%) Granulation Quality: Red Necrotic Amount: Medium (34-66%) Necrotic Quality: Adherent Slough Foul Odor After Cleansing: Slough/Fibrino Yes Exposed Structure Fascia  Exposed: No Fat Layer (Subcutaneous Tissue) Exposed: Ye Tendon Exposed: No Muscle Exposed: No Joint Exposed: No Bone Exposed: No %  Reduction in Area: -383.5% % Reduction in Volume: -385.1% Epithelialization: Small (1-33%) Tunneling: No Undermining: No No s Treatment Notes Wound #5R (Right, Anterior Lower Leg) 1. Cleanse With Wound Cleanser 2. Periwound Care Barrier cream Skin Prep 3. Primary Dressing Applied Collagen Hydrogel or K-Y Jelly 4. Secondary Dressing Foam Border Dressing 5. Secured With Advice workerelf Adhesive Bandage Electronic Signature(s) Signed: 08/22/2019 3:43:24 PM By: Benjaman KindlerJones, Dedrick EMT/HBOT Signed: 08/22/2019 5:43:27 PM By: Yevonne PaxEpps, Carrie RN Previous Signature: 08/21/2019 5:45:39 PM Version By: Yevonne PaxEpps, Carrie RN Entered By: Benjaman KindlerJones, Dedrick on 08/22/2019 14:51:06 -------------------------------------------------------------------------------- Vitals Details Patient Name: Date of Service: Reginald GalaUSS, Reginald H. 08/21/2019 12:45 PM Medical Record [id number] Patient Account Number: [id number] Date of Birth/Sex: Treating RN: 1950-12-12 (68 y.o. Reginald Ayers) Yevonne PaxEpps, Carrie Primary Care Matteson Blue: Katy ApoPolite, Ronald D Other Clinician: Referring Ariah Mower: Treating Romaine Maciolek/Extender:Robson, Almira CoasterMichael Polite, Deirdre Peeronald D Weeks in Treatment: 180 Vital Signs Time Taken: 13:42 Temperature (F): 98.7 Height (in): 61 Pulse (bpm): 86 Weight (lbs): 122 Respiratory Rate (breaths/min): 18 Body Mass Index (BMI): 23 Blood Pressure (mmHg): 136/56 Reference Range: 80 - 120 mg / dl Electronic Signature(s) Signed: 08/21/2019 5:45:39 PM By: Yevonne PaxEpps, Carrie RN Entered By: Yevonne PaxEpps, Carrie on 08/21/2019 13:42:32

## 2019-08-22 ENCOUNTER — Other Ambulatory Visit: Payer: Self-pay | Admitting: *Deleted

## 2019-08-22 DIAGNOSIS — I6523 Occlusion and stenosis of bilateral carotid arteries: Secondary | ICD-10-CM

## 2019-08-22 DIAGNOSIS — I872 Venous insufficiency (chronic) (peripheral): Secondary | ICD-10-CM

## 2019-08-22 DIAGNOSIS — I779 Disorder of arteries and arterioles, unspecified: Secondary | ICD-10-CM

## 2019-08-22 DIAGNOSIS — I70233 Atherosclerosis of native arteries of right leg with ulceration of ankle: Secondary | ICD-10-CM

## 2019-08-28 DIAGNOSIS — I10 Essential (primary) hypertension: Secondary | ICD-10-CM | POA: Diagnosis not present

## 2019-08-28 DIAGNOSIS — J449 Chronic obstructive pulmonary disease, unspecified: Secondary | ICD-10-CM | POA: Diagnosis not present

## 2019-08-28 DIAGNOSIS — I8310 Varicose veins of unspecified lower extremity with inflammation: Secondary | ICD-10-CM | POA: Diagnosis not present

## 2019-08-28 DIAGNOSIS — Z23 Encounter for immunization: Secondary | ICD-10-CM | POA: Diagnosis not present

## 2019-08-28 DIAGNOSIS — I509 Heart failure, unspecified: Secondary | ICD-10-CM | POA: Diagnosis not present

## 2019-08-28 DIAGNOSIS — N1831 Chronic kidney disease, stage 3a: Secondary | ICD-10-CM | POA: Diagnosis not present

## 2019-08-28 DIAGNOSIS — I2510 Atherosclerotic heart disease of native coronary artery without angina pectoris: Secondary | ICD-10-CM | POA: Diagnosis not present

## 2019-08-28 DIAGNOSIS — E7800 Pure hypercholesterolemia, unspecified: Secondary | ICD-10-CM | POA: Diagnosis not present

## 2019-08-28 DIAGNOSIS — I739 Peripheral vascular disease, unspecified: Secondary | ICD-10-CM | POA: Diagnosis not present

## 2019-08-28 DIAGNOSIS — E1151 Type 2 diabetes mellitus with diabetic peripheral angiopathy without gangrene: Secondary | ICD-10-CM | POA: Diagnosis not present

## 2019-08-28 DIAGNOSIS — E11319 Type 2 diabetes mellitus with unspecified diabetic retinopathy without macular edema: Secondary | ICD-10-CM | POA: Diagnosis not present

## 2019-09-11 ENCOUNTER — Encounter (HOSPITAL_BASED_OUTPATIENT_CLINIC_OR_DEPARTMENT_OTHER): Payer: Medicare Other | Admitting: Internal Medicine

## 2019-09-21 ENCOUNTER — Other Ambulatory Visit: Payer: Self-pay

## 2019-09-21 ENCOUNTER — Encounter (HOSPITAL_BASED_OUTPATIENT_CLINIC_OR_DEPARTMENT_OTHER): Payer: Medicare Other | Attending: Internal Medicine | Admitting: Internal Medicine

## 2019-09-21 DIAGNOSIS — F17210 Nicotine dependence, cigarettes, uncomplicated: Secondary | ICD-10-CM | POA: Insufficient documentation

## 2019-09-21 DIAGNOSIS — L97213 Non-pressure chronic ulcer of right calf with necrosis of muscle: Secondary | ICD-10-CM | POA: Insufficient documentation

## 2019-09-21 DIAGNOSIS — Z95828 Presence of other vascular implants and grafts: Secondary | ICD-10-CM | POA: Insufficient documentation

## 2019-09-21 DIAGNOSIS — E11622 Type 2 diabetes mellitus with other skin ulcer: Secondary | ICD-10-CM | POA: Diagnosis not present

## 2019-09-21 DIAGNOSIS — E1151 Type 2 diabetes mellitus with diabetic peripheral angiopathy without gangrene: Secondary | ICD-10-CM | POA: Diagnosis not present

## 2019-09-21 DIAGNOSIS — E11621 Type 2 diabetes mellitus with foot ulcer: Secondary | ICD-10-CM | POA: Insufficient documentation

## 2019-09-21 DIAGNOSIS — L97812 Non-pressure chronic ulcer of other part of right lower leg with fat layer exposed: Secondary | ICD-10-CM | POA: Diagnosis not present

## 2019-09-21 DIAGNOSIS — I2510 Atherosclerotic heart disease of native coronary artery without angina pectoris: Secondary | ICD-10-CM | POA: Insufficient documentation

## 2019-09-21 DIAGNOSIS — I6529 Occlusion and stenosis of unspecified carotid artery: Secondary | ICD-10-CM | POA: Insufficient documentation

## 2019-09-21 DIAGNOSIS — Z951 Presence of aortocoronary bypass graft: Secondary | ICD-10-CM | POA: Insufficient documentation

## 2019-09-21 NOTE — Progress Notes (Signed)
[name], Reginald Ayers ([id number]) Visit Report for 09/21/2019 Debridement Details Patient Name: Date of Service: [name], [name] 09/21/2019 2:30 PM Medical Record [id number] Patient Account Number: [id number] Date of Birth/Sex: Treating RN: 1950-12-22 (69 y.o. M) Primary Care Provider: Katy Apo Other Clinician: Referring Provider: Treating Provider/Extender:Teruko Joswick, [name], [name]: 184 Debridement Performed for Wound #5R Right,Anterior Lower Leg Assessment: Performed By: Physician Maxwell Caul., MD Debridement Type: Debridement Severity of Tissue Pre Fat layer exposed Debridement: Level of Consciousness (Pre- Awake and Alert procedure): Pre-procedure Verification/Time Out Taken: Yes - 15:31 Start Time: 15:32 Pain Control: Other : benzocaine 20% Total Area Debrided (L x W): 2.5 (cm) x 2.8 (cm) = 7 (cm) Tissue and other material Viable, Non-Viable, Slough, Subcutaneous, Skin: Dermis , Fibrin/Exudate, Slough debrided: Level: Skin/Subcutaneous Tissue Debridement Description: Excisional Instrument: Curette Bleeding: Moderate Hemostasis Achieved: Pressure End Time: 15:37 Procedural Pain: 0 Post Procedural Pain: 2 Response to Treatment: Procedure was tolerated well Level of Consciousness Awake and Alert (Post-procedure): Post Debridement Measurements of Total Wound Length: (cm) 2.5 Width: (cm) 2.8 Depth: (cm) 0.1 Volume: (cm) 0.55 Character of Wound/Ulcer Post Improved Debridement: Severity of Tissue Post Debridement: Fat layer exposed Post Procedure Diagnosis Same as Pre-procedure Electronic Signature(s) Signed: 09/21/2019 5:54:40 PM By: Baltazar Najjar MD Entered By: Baltazar Najjar on 09/21/2019 17:12:46 -------------------------------------------------------------------------------- HPI Details Patient Name: Date of Service: Reginald Ayers 09/21/2019 2:30 PM Medical Record [id number] Patient Account Number:  [id number] Date of Birth/Sex: Treating RN: [birth year] (69 y.o. M) Primary Care Provider: Katy Apo Other Clinician: Referring Provider: Treating Provider/Extender:Marguerite Barba, [name], [name]: 184 History of Present Illness HPI Description: 03/06/16; this is a patient who is a type II diabetic with a recent hemoglobin A1c of 7.6. He is referred from Dr. Allyson Sabal for aggressive wound care on a wound on his right anterior leg. He has known severe PAD and has an extensive interventional history. His left common iliac artery was stented in December 2003 and his right superficial femoral artery in April 2008. He has significant infrapopliteal disease bilaterally and has known occluded left SFA. His ABIs from September 2016 showed a ABI of 0.48 on the right and 0.52 on the left although he does not really describe claudication. He also has known carotid artery disease, coronary artery disease and is status post CABG in 2010. He tells me he smokes 3 cigarettes a day. The patient tells me his at the wound on the right anterior leg for about 6 months. He has a smaller area on the left medial leg that has been present for one month. He's been to see his primary physician Dr. polite and Dr. Allyson Sabal I am not clear what he is been using on this. It is felt that his ulcer is an ischemic ulcer on the pretibial region. It is noted that if we are unable to heal this it was felt he would need repeat angiography and potential percutaneous revascularization. 03/13/16; the area on the left medial leg is healed over. His ischemic wound on the right anterior leg is debridement of a lot of nonviable subcutaneous tissue we've been using Santyl 03/20/16; he has no open area on the left medial leg. The wound on the right anterior leg looks ischemic. A lot of nonviable tissue. Our intake nurse reported that he had a lot of dried blood clots around the wound presumably from last week's debridement.  There is no evidence of infection 04/16/16; the area on the right anterior  leg is an ischemic wound. He is been back to see Dr. Allyson Sabal who states he is not a candidate for revascularization. He has a large area of occlusion from his groin down which cannot be revascularized. Since Dr. Allyson Sabal is aggressive with this type of situation I can only surmised that that's the correct decision 05/01/16 the area on the right leg is an ischemic wound. He has not a candidate for revascularization. He has not smoked in 48 hours. Has been using Santyl wound size is not any smaller however this surface looks somewhat better 05/08/16; ischemic wound on the right anterior leg. He is not a candidate for revascularization. He continues with nonsmoking for the last week. I use Prisma last week although that probably wasn't a good decision, covered in a difficult surface eschar today requiring debridement 05/15/16; patient with ischemic wound on his right anterior leg. He is not a candidate for revascularization. Complains of a lot of pain this week/burning over the wound area making it difficult for him to sleep 05/21/16; patient states less pain this week. The wound had circumferential fibrinous slough that I removed along with some nonviable subcutaneous tissue. We used Hydrofera Blue last week 05/28/16; patient states he has not had any pain. Some minor reduction in the area. Still using Hydrofera Blue 06/04/16; no pain still using Hydrofera Blue 06/11/16 no complaints still using Hydrofera Blue 06/18/16; I changed him to Endoform last week. 06/25/16; wound still appears healthy. Minor debridement this week still using Endoform 07/02/16; the wound bed certainly appears healthier. Rim of epithelialization superiorly was there last week. It certainly has not progressed any further. Using Endoform 07/09/16 patient is here for a review of the wound on the right anterior leg. He has a history of severe and prolonged PAD  followed by Dr. Allyson Sabal in the setting of type 2 diabetes. I have been using Endoform for the last 3 or 4 weeks. He has a rim of epithelialization superiorly but I have not been able to advance this. He complains of pain but there is no clear infection 07/15/2016 -- the patient was here for a nurse visit but due to the presence of a new wound I was asked to see him for an opinion 07/23/16; the patient was seen last week by Dr. Meyer Russel in my absence. He had a new wound on his right dorsal ankle. Santyl was applied to this new area and also his original wound on the right anterior tibia. He arrives today complaining of a lot of pain 07/30/16; the patient has new wound on his right dorsal ankle from 2 weeks ago. This is as usual covered with a thick eschar. I think this is ischemic and I'm not expecting to be able to heal this. Santyl was applied for but denied by his insurance we will therefore use Medihoney. He only obtained this later today. He has left a wrap on all week with Santyl. 08/06/16 both wounds in the same situation still a gelatinous surface slough. He finds the Medihoney makes him feel pain. He could not afford this Santyl opiate 08/20/16; patient arrives today frustrated about his nonhealing wounds now 2 on his right leg. He arrived here with a wound on the right anterior leg mid aspect and is developed a second area on the right dorsal ankle. I have reviewed Dr. Hazle Coca notes and his visit from August/17. There he makes clear reference to the fact that the vessels were not revascularizable. The patient also describes fairly clear claudication  at rest which makes him sleep with his legs in a dependent position. He has given up smoking as of yesterday 09/03/16; patient is going for a second opinion on his vascular status of vein and vascular next week. We have been using Medihoney to the wounds. He is tolerating his discomfort. 09/17/16; the patient is going to have an angiogram on  January 30. Miraculously I can't really see has had a recent angiogram although he has been to see Dr. Allyson Sabal who does not think here is cerebrovascular rise visible option. He does have stents in both legs that Dr. Allyson Sabal put and I think about 9 years ago. I would like to continue to use Medihoney until we see the results of his angiogram. 10/01/16; the patient had his angiogram. He has stent previously which was dilated and then a stent was placed in the right superficial femoral artery. He had failed balloon angioplasty of the right posterior tibial artery. A further stent was placed in the superficial femoral artery. An angioplasty was also done. He was noted to have severe stenosis versus occlusion of the posterior tibial artery which is the dominant vessel across the foot. Intervention was attempted here but was unsuccessful. If the patient is unable to heal his wounds it was felt he could be brought back for repeat attempt 10/15/16- patient is here for follow-up evaluation of his right lower extremity and right dorsal foot ulcers. He was unable to make last week's appointment secondary to a death in the family. He has been using Iodosorb/Iodoflex dressing changes per home health. He states that his primary care physician follows an A1c every 3 months, with his last A1c being 7.8 which is elevated from the previous. He states he has a scheduled A1c for next month. He also states he has a follow-up appointment with Vein and Vascular in April 10/22/16- Mr. Woodrome did have an xray of the right foot, revealing no definite evidence of bone destruction to suggest osteomyelitis. This was performed at [facility]. He has been wearing his show with a rolled tongue since the last visit when he leaves the home, otherwise he wears loose house slippers around the house. He is voicing no complaints or concerns since his last visit. He admits to smoking "maybe one a week" 10/29/16- patient is here for  follow-up evaluation of his right lower extremity and right dorsal foot ulcers. He states that he has completely stopped smoking but does admit to dipping tobacco. We have not found out his coverage for both [employer] and Apligraf. He continues to offload this area with his walking shoe otherwise wears a slipper at home. He brought this (and it does appear to allow for relief of pressure to the dorsal part of his foot. 11/05/16; patient is here for follow-up of his right lower extremity and right dorsal foot ulcers. He has been revascularized. He no longer smokes. We have been using Prisma. 11/12/16- Mr. Everton arrives for follow up evaluation of his right lower extremity ulcers. We have been using Prisma. He is planned for an Apligraf application today. 11/19/16- Mr.Muccio arrives for follow-up evaluation of his right lower extremity and right dorsal foot ulcer one week after Apligraf #1 application. He states the home health came out on Monday and change the foam, Kerlix and Coban and and they did not interrupt the Adaptic touch. He has a follow-up appointment with ultrasound on Monday with vein and vascular 11/26/16- he is here for follow-up evaluation. He is s/p Apligraf #1  application 2 weeks ago. he had an appointment with vascular on Monday for arterial ultrasound. In evaluation of the stent to the right SFA which indicates biphasic waveform proximal to the stent and at the origin of the stent otherwise monophasic flow throughout the stent and at the distal aspect of the stent. An ABI done in that office on Monday was 0.73 (previously 0.47). He has biphasic flow in the right posterior tibial and monophasic flow in the dorsalis pedis. He has a follow-up with Dr. Myra Gianotti on Monday, 4/9 12/03/16- He is here for follow-up evaluation. He did see Dr. Myra Gianotti on Monday and has a scheduled aortogram with lower extremity arteriogram on the 17th. He was not compressed over the last week. His wound culture  came back Oxacillin sensitive staph aureus. He was prescribed Keflex and has been taking that without any voiced complaints. He states that he started taking his Lasix, although he did not contact or have an appointment with Dr. Nehemiah Settle. He does have an appointment in May 12/10/16- he is here for follow-up evaluation. He had an intervention on 4/17 with Dr. Myra Gianotti. According to his dictation 1) the patient has a widely patent right SFA and popliteal stent 2) after the an 80% occlusion of the right PTA was treated with atherectomy and balloon angioplasty with a residual stenosis of less than 5%. The patient is voicing significant improvement and overall feeling; states that he can now sleep in the bed without any symptoms. His completed his Keflex with no adverse reaction. He continues to self-regulate his Lasix, he is not on any potassium supplementation. 12/17/16; Arrives today with weeping edema thru the dorsal foot wound and lower leg. He has not been using his Lasix and did not have compression on his leg. According to the patient he has 40mg  Lasix at home which he takes prn but not in weeks 12/21/16; less edema and no weeping edema. Apligraf #1 12/31/16; both wounds look somewhat better especially the area distally. Apligraf #2 01/14/17; Apligraf #3. Wounds looked improved. Patient saw main and vascular. ABIs on 01/11/17 done in their office showed a patent SFA stent status post atherectomy with angioplasty of the right PTA [12/08/16] biphasic flow on the right 0.95 monophasic flow on the left 0.45. 01/28/17; Apligraf #4. Wounds not particularly better than I remember. 02/11/17; Apligraf #5 the wounds have some improvement. The distal 1 has had some epithelialization proximal one is somewhat smaller. 02/25/17; he arrived in clinic today with a disappointing amount of surface necrotic material. This is especially true on the distal anterior foot/ankle wound. Change primary dressing the Hydrofera Blue  today 03/04/17; some improvement in the proximal wound on the tibia. The distal wound on the dorsal foot/ankle has too much necrotic surface. We have been using Hydrofera Blue although there was apparently some problem with obtaining it through his home health company however the patient reports he now has it 03/11/17; wounds measure slightly smaller. Surface looks questionable although with the improvement in dimensions will likely continue with the [facility] without debridement today 03/18/17; no major change in these wounds. Questionable surface. Debridement today. Using [facility] 03/25/17; the patient has follow-up noninvasive vascular tests with vein and vascular later this month. The wounds himself are not measuring any different however the proximal wound bed looks better with the Iodoflex not much change with the area over the dorsal foot. 04/22/17; patient had repeat angiography on 04/20/17. He had a drug-coated balloon angioplasty of the right superficial femoral artery balloon  angioplasty right common femoral artery. He had stenosis of previously placed stents within the right superficial femoral artery this was treated with drug coated balloon angioplasty. He continues to have single vessel runoff via the peroneal artery He has been using Hydrofera Blue 04/29/17; no major change this week. Wound beds appear healthy. No debridement. He apparently did not tolerate Iodoflex which I was using prior to modification. I'm going to change him to Endo form with hydrogel 05/13/17; wound beds again appear a lot better, paradoxically the wound on the dorsal foot appears to be progressing better than the proximal wound. Using Endoform with hydrogel 06/03/17; doing well. wounds smaller especially the wound on the dorsal foot. using endoform 06/17/17; two-week follow-up everything is going really nicely here. Using Endoform he only has one small open area left on the right dorsal foot/ankle. Also  improved on the right anterior leg. He is retired from smoking since starting with [place] in this clinic 07/01/17; two-week follow-up the area on his right anterior foot/ankle is just about closed. The area superiorly on the lower mid calf as adherent necrotic debris which will need debridement but it is also down in terms of dimensions. We'll use Endoform 07/19/17 on evaluation today patient appears to be doing well with his Current wound care measures and there does not appear to be necrotic tissue which is good news. The Endoform however does seem to be causing some epithelialization although this is loose at the proximal/12 o'clock location and seems to be pulling off with dressing changes. No fevers, chills, nausea, or vomiting noted at this time. He has no significant discomfort. 08/05/17; the patient has 2 small open areas on the proximal foot and a larger wound on the right anterior leg. Change to [facility] Blue last time probably because of the hyper granulation proximally 08/26/17; the patient has 2 small open areas on the proximal foot wound which is closed. The other is covered with a thin surface eschar. There is a similar wound on the right anterior leg. We've been using Hydrofera Blue 09/09/17*; the patient went for angiogram with Dr. Myra Gianotti. Apparently a routine scheduled arterial duplex suggested occlusion of his stent however with the angiography the stent was widely patent. No procedure was necessary. He also apparently has had a long-standing pressure area on the left buttock which dates back to prior to Thanksgiving although he's never really said anything to anybody about this. He is been attempting to treated himself with triple antibiotic cream etc. We've been using silver collagen to the wounds on his right leg and dorsal right foot. These are largely unchanged today 09/23/17; patient's wounds are smaller in both areas and are just about closed. We have been using silver  collagen he shows me an area on his buttock that is in the gluteal fold. This would be an atypical place for a pressure sore like a herpetic lesion although he says it's been there for a long time 2/14/19the patient's wound is totally closed. The area on the dorsal foot is also close to closing. I looked again at the area on his left buttock in the gluteal fold. He tells me that this is been there for 6 months. This more looks more like a pressure ulcer to me than it did when I looked at this 2 weeks ago. I simply put topical antibiotics on this but we are going to have to change to a more formal dressing 10/13/17 on evaluation today patient appears to be doing very well  in regard to his right dorsal foot wound. He has been tolerating the dressing changes without complication. Fortunately there does not appear to be any evidence of infection which is good news. He continues to have a left gluteal ulcer as well as he has been tolerating the Prisma dressing on although this is not significantly smaller it does sound like he's been putting other things on this as well including some kind of frame he in fact this order another cream as well although I'm not exactly sure what this is. It's something that he "researched on the Internet that is supposed to make My wound healed him seven days." At worst his pain is a 2/10 in regard to the left gluteal region but this is mainly with cleansing of the wound. 10/21/17; his right dorsal foot wound is just about closed. The area on the right anterior shin area is closed. He has a left gluteal ulcer which is probably a pressure ulcer. He is been using his own creams that he spot on the Internet. This wound is also better. 11/04/17; this is a patient with severe PAD who is been revascularized by vein and vascular. He had 2 difficult wound areas one on the right anterior shin and the right anterior dorsal foot. More recently he has developed a left gluteal ulcer  which is probably a pressure ulcer although it has some atypical nature to it. His proximal right leg has remained healed. He has a small open area on the right dorsal foot with a nonviable surface 11/18/17; this is a patient with severe PAD who is been revascularized by vein and vascular. The area on the right proximal leg has healed over. He has a very small ischemic wound on the right dorsal foot which was the second wound. He also had a pressure ulcer on his buttock which is healed out as of today 12/02/17; wound on the dorsal foot which I think is largely ischemic. He is upset with me for doing a surface debridement on this last visit however he still has a nonviable surface on this and I think this is largely a blood flow issue 12/16/17; wound on the dorsal foot which I think is largely ischemic. He sees Dr. Lita Mains ham again next week. We've been using Iodoflex to this not much of a healthy surface. 12/30/17; the patient went back to see vascular surgery. He had noninvasive testing. His ABI on the right was 0.70 compared to 0.60 last time on the left 0.57 compared to 0.44. TBI's were 0.3 on the right 0.29 on the left essentially unchanged from last time. Waveforms were monophasic. He was seen by a provider. Noted nonpalpable pulses. Overall they felt that they had slightly improved bilateral ABIs and right TBI. Her disease bilaterally with all waveforms monophasic. They will follow his lower extremity arterial duplex in 6 months. They're calling the areas on his feet venous stasis ulcers. He does not clearly describe claudication 01/13/18; The patient's wound is slightly larger in width and nonviable surface. We've been using Iodoflex. He has home help changing the dressings. I have been reluctant to do debridement on this since he blames a debridement for reopening his wound actually still think this is arterial even given recent arterial results and evaluation [see above) 01/27/18; using iodoflex.  better surface. 02/10/18; using Iodoflex. Surface is some better. Changed him to silver collagen today 03/02/18 on evaluation today patient continues to show signs of slow but good progress in regard to his left  lower extremity ulcer. He has been tolerating the dressing changes without complication which is good news. He does have somewhat limited/poor vascular flow but nonetheless he does seem to be healing just at a much slower place and what would be otherwise recommended and expected. I do believe the silver collagen dressing change has been of benefit. 03/22/18; slow but good progress in terms of his right anterior foot wound. These are largely ischemic wounds but he has been revascularized. He is continued with silver collagen 04/07/18 on evaluation today patient appears to be doing rather well in regard to the wound on the dorsal surface of his right foot. He's been tolerating the dressing changes without complication. Fortunately there does not appear to be the evidence of infection at this time which is great news. Overall I'm very pleased with how things seem to appear at this point. 04/21/18; dorsal right foot wound secondary to PAD. Wound is making progress by 2 mm. Surface of it is passable. He is been using silver collagen 05/05/18; we received a call from home health this week that the original proximal tibial area had reopened on the right. He now has 2 open areas including the one we've been watching for a long period on the foot. The new open area seems to be right in the same place as the original wound previously was located 05/13/2018; wounds are both actually larger. I debrided these last week both wounds are bigger. The surface looks better I been using Iodoflex. 05/27/2018; the wounds are larger but the surface of them looks better with Iodoflex. He has a follow-up with Dr. Myra Gianotti sometime in November. I tried to move these up we were not able to get an earlier appointment I  will try to text him. 06/16/2018; the patient's wounds are larger but with a generally cleaner surface. He has extensive set of investigations with Dr. Myra Gianotti on November 5. This includes ABIs TBI's and I believe an ultrasound of the stent. 07/04/2018; the patient apparently stated he felt too dizzy to go for his arterial studies on 11/5 therefore he canceled him. He has not been able to rebook them. I suspect these are ischemic wounds and I told him this. 09/01/2018; he is not been here in almost 2 months. He has been getting his dressing changed by [facility] I believe silver collagen. They are discharging him next week due to nonimprovement of the wounds predominantly. He did not follow-up with vascular surgery he was supposed to have follow-up arterial studies noninvasive. I am not sure that they have been rebooked and I do not think he has been seen. 1/23; patient's wounds are slightly larger. I believe this is ischemic. He canceled his last follow-up appointment with Dr. Myra Gianotti him that included noninvasive arterial studies. He says this was because of vertigo. He has not rescheduled these. He also has lost home health because of the chronicity of his wounds 2/6; the patient's wound on his right anterior tibia actually looks quite good. Not much difference in size but the surface of the wound looks good. He has a small area on the dorsal foot debris on the surface of this which I debrided with Anasept and gauze. He has not made a follow-up with vein and vascular 2/20; the patient's wound on his right anterior tibia continues to look good although it is not coming down in size much. The area on his right dorsal foot is about the same. Tightly adherent debris. The wound surface does not look  healthy care. The patient is expressed his to sustain for mechanical debridement in the past, I therefore been avoiding this at least until he goes back for repeat vascular appointment. So far he has not  done this he is mingling for ENT appointments related to vertigo 3/5; right anterior tibia continues to look satisfactory perhaps slightly smaller. The area on the right dorsal foot is smaller. We have been using Hydrofera Blue. 4/16; the patient wounds have actually done well in the hiatus since we have last seen him. He is using Hydrofera Blue ready 6/25; we have not seen this patient in over 2 months as he has been in the isolation phase for the Covid epidemic. He is using Hydrofera Blue ready. He has 2 wounds right on the right anterior mid tibia and one on the right dorsal foot. Both of these are somewhat improved today. Especially the dorsal 1 over the tibia. He has not been for follow- up arterial studies but I have got him to agree to allow [place] to set these up. I 7/16; the patient's wound proximally in the mid tibia area has closed over once again. We had this closed over before and he reopened the area on the dorsal foot appears better. He has been using Hydrofera Blue. We do not have an appointment for the follow-up arterial studies I ordered nor do I think he is really all that interested in getting them done. 7/30; patient has been using Hydrofera Blue. The dorsal foot wound looks some better however he has had a reopening in the mid tibia area. This is been closed over the last time. The opening is very superficial. We have been trying to get him follow-up vascular studies done with vein and vascular for about a year. He refused to do these at one point because he was at work getting worked up for vertigo. He is now more ready to move forward with this. 8/20; patient is using Hydrofera Blue. He has not made an appointment for his follow-up noninvasive vascular studies and I have talked to him about this again. Both wound areas are still open on the right mid tibia and the right dorsal foot although I think they are smaller 05/04/19-Patient is back after his vascular visit his studies  revealed that he is got ABI of 0.6 on the right, 0.46 on the left, monophasic flow to both right and left anterior tibial arteries is got great pressures to the right great toe. 10/8; we still do not have a vascular surgery appointment 1 month later. I think he may require a repeat angiogram his wounds on the right anterior to mid tibia is quite a bit larger the area on the right dorsal ankle, is small. Very little open area here 11/12; patient has not been here in over a month. He still did not have a vascular surgery appointment however before he left the clinic today we managed to arrange that for 12/9. The area on his right dorsal foot is surprisingly closed over today however the area in the mid tibia is quite a bit larger than when we saw this last time. 12/3; patient has a vascular appointment next week in follow-up from his previous revascularization. The area on the right dorsal foot remains closed. He still has a fairly sizable wound on the anterior tibia but it is superficial. We have been using silver collagen 12/28. The patient saw Langston Reusing the nurse practitioner at [facility] vascular. Noted that the most recent ABIs  were in September 2020 which showed a mild decline bilaterally a 0.61 on the right with monophasic waveforms and 0.46 on the left with monophasic waveforms. It was felt that the area on his right lower leg was a venous ulcer. The area on the dorsal foot remains closed. Follow-up ABIs were ordered for 6 months. We have been ordering silver collagen 1/28; monthly follow-up. Not much change in the wound on the anterior mid tibia. We have been using silver collagen for a prolonged period of time now. He has been reviewed by vascular with regards to his known PAD. They did not think that he needed any further interventions for now and ordered follow-up ABIs in 6 months. We did manage to heal the area on the dorsal foot and that remains closed Electronic  Signature(s) Signed: 09/21/2019 5:54:40 PM By: Baltazar Najjar MD Entered By: Baltazar Najjar on 09/21/2019 17:14:14 -------------------------------------------------------------------------------- Physical Exam Details Patient Name: Date of Service: Reginald Ayers 09/21/2019 2:30 PM Medical Record [id number] Patient Account Number: [id number] Date of Birth/Sex: Treating RN: 1950-09-12 (69 y.o. M) Primary Care Provider: Katy Apo Other Clinician: Referring Provider: Treating Provider/Extender:Pamila Mendibles, [name], [name]: 184 Constitutional Patient is hypertensive.. Pulse regular and within target range for patient.Marland Kitchen Respirations regular, non-labored and within target range.. Temperature is normal and within the target range for the patient.Marland Kitchen Appears in no distress. Notes Wound exam; I do not feel the pulses in his feet however I did feel a popliteal pulse today. The tibial wound is debrided with a #5 curette not a very viable surface. Subcutaneous debris removed hemostasis with direct pressure but truthfully there was minimal bleeding Electronic Signature(s) Signed: 09/21/2019 5:54:40 PM By: Baltazar Najjar MD Entered By: Baltazar Najjar on 09/21/2019 17:15:00 -------------------------------------------------------------------------------- Physician Orders Details Patient Name: Date of Service: Reginald Ayers 09/21/2019 2:30 PM Medical Record [id number] Patient Account Number: [id number] Date of Birth/Sex: Treating RN: 21-Jan-1951 (69 y.o. [name] Primary Care Provider: Katy Apo Other Clinician: Referring Provider: Treating Provider/Extender:Rockford Leinen, [name], [name]: [phone] Verbal / Phone Orders: No Diagnosis Coding ICD-10 Coding Code Description [phone] Non-pressure chronic ulcer of right calf with necrosis of muscle E11.622 Type 2 diabetes mellitus with other skin ulcer E11.621 Type 2  diabetes mellitus with foot ulcer E11.51 Type 2 diabetes mellitus with diabetic peripheral angiopathy without gangrene Follow-up Appointments Return appointment in 3 weeks. Dressing Change Frequency Wound #5R Right,Anterior Lower Leg Other: - twice a week Skin Barriers/Peri-Wound Care Barrier cream - apply zinc oxide around the wound bed for wetness, reddness, or maceration. Moisturizing lotion - to leg Wound Cleansing Wound #5R Right,Anterior Lower Leg May shower with protection. Primary Wound Dressing Wound #5R Right,Anterior Lower Leg Polymem Secondary Dressing Wound #5R Right,Anterior Lower Leg Foam Border - or bordered gauze. Edema Control Avoid standing for long periods of time Elevate legs to the level of the heart or above for 30 minutes daily and/or when sitting, a frequency of: - throughout the day Other: - patient to purchase support stockings for protection. Off-Loading Turn and reposition every 2 hours Electronic Signature(s) Signed: 09/21/2019 5:54:40 PM By: Baltazar Najjar MD Signed: 09/21/2019 6:05:55 PM By: Shawn Stall Entered By: Shawn Stall on 09/21/2019 15:36:21 -------------------------------------------------------------------------------- Problem List Details Patient Name: Date of Service: Reginald Ayers 09/21/2019 2:30 PM Medical Record [id number] Patient Account Number: [id number] Date of Birth/Sex: Treating RN: 03/06/1951 (69 y.o. [name] Primary Care Provider: Katy Apo Other Clinician: Referring Provider: Treating Provider/Extender:Gianna Calef,  [name], Deirdre Peer Weeks in Treatment: 184 Active Problems ICD-10 Evaluated Encounter Code Description Active Date Today Diagnosis L97.213 Non-pressure chronic ulcer of right calf with necrosis 03/06/2016 No Yes of muscle E11.622 Type 2 diabetes mellitus with other skin ulcer 03/06/2016 No Yes E11.621 Type 2 diabetes mellitus with foot ulcer 11/19/2016 No Yes E11.51 Type 2  diabetes mellitus with diabetic peripheral 03/06/2016 No Yes angiopathy without gangrene Inactive Problems ICD-10 Code Description Active Date Inactive Date L97.512 Non-pressure chronic ulcer of other part of right foot with fat 03/03/2018 03/03/2018 layer exposed Resolved Problems ICD-10 Code Description Active Date Resolved Date L97.221 Non-pressure chronic ulcer of left calf limited to breakdown of 03/06/2016 03/06/2016 skin L97.312 Non-pressure chronic ulcer of right ankle with fat layer 07/15/2016 07/15/2016 exposed L89.322 Pressure ulcer of left buttock, stage 2 09/09/2017 09/09/2017 Electronic Signature(s) Signed: 09/21/2019 5:54:40 PM By: Baltazar Najjar MD Entered By: Baltazar Najjar on 09/21/2019 17:12:30 -------------------------------------------------------------------------------- Progress Note Details Patient Name: Date of Service: Reginald Ayers 09/21/2019 2:30 PM Medical Record [id number] Patient Account Number: [id number] Date of Birth/Sex: Treating RN: 29-Jul-1951 (69 y.o. M) Primary Care Provider: Katy Apo Other Clinician: Referring Provider: Treating Provider/Extender:Gavyn Zoss, [name], [name]: 184 Subjective History of Present Illness (HPI) 03/06/16; this is a patient who is a type II diabetic with a recent hemoglobin A1c of 7.6. He is referred from Dr. Allyson Sabal for aggressive wound care on a wound on his right anterior leg. He has known severe PAD and has an extensive interventional history. His left common iliac artery was stented in December 2003 and his right superficial femoral artery in April 2008. He has significant infrapopliteal disease bilaterally and has known occluded left SFA. His ABIs from September 2016 showed a ABI of 0.48 on the right and 0.52 on the left although he does not really describe claudication. He also has known carotid artery disease, coronary artery disease and is status post CABG in 2010. He tells  me he smokes 3 cigarettes a day. The patient tells me his at the wound on the right anterior leg for about 6 months. He has a smaller area on the left medial leg that has been present for one month. He's been to see his primary physician Dr. polite and Dr. Allyson Sabal I am not clear what he is been using on this. It is felt that his ulcer is an ischemic ulcer on the pretibial region. It is noted that if we are unable to heal this it was felt he would need repeat angiography and potential percutaneous revascularization. 03/13/16; the area on the left medial leg is healed over. His ischemic wound on the right anterior leg is debridement of a lot of nonviable subcutaneous tissue we've been using Santyl 03/20/16; he has no open area on the left medial leg. The wound on the right anterior leg looks ischemic. A lot of nonviable tissue. Our intake nurse reported that he had a lot of dried blood clots around the wound presumably from last week's debridement. There is no evidence of infection 04/16/16; the area on the right anterior leg is an ischemic wound. He is been back to see Dr. Allyson Sabal who states he is not a candidate for revascularization. He has a large area of occlusion from his groin down which cannot be revascularized. Since Dr. Allyson Sabal is aggressive with this type of situation I can only surmised that that's the correct decision 05/01/16 the area on the right leg is an ischemic wound. He has  not a candidate for revascularization. He has not smoked in 48 hours. Has been using Santyl wound size is not any smaller however this surface looks somewhat better 05/08/16; ischemic wound on the right anterior leg. He is not a candidate for revascularization. He continues with nonsmoking for the last week. I use Prisma last week although that probably wasn't a good decision, covered in a difficult surface eschar today requiring debridement 05/15/16; patient with ischemic wound on his right anterior leg. He is not a  candidate for revascularization. Complains of a lot of pain this week/burning over the wound area making it difficult for him to sleep 05/21/16; patient states less pain this week. The wound had circumferential fibrinous slough that I removed along with some nonviable subcutaneous tissue. We used Hydrofera Blue last week 05/28/16; patient states he has not had any pain. Some minor reduction in the area. Still using Hydrofera Blue 06/04/16; no pain still using Hydrofera Blue 06/11/16 no complaints still using Hydrofera Blue 06/18/16; I changed him to Endoform last week. 06/25/16; wound still appears healthy. Minor debridement this week still using Endoform 07/02/16; the wound bed certainly appears healthier. Rim of epithelialization superiorly was there last week. It certainly has not progressed any further. Using Endoform 07/09/16 patient is here for a review of the wound on the right anterior leg. He has a history of severe and prolonged PAD followed by Dr. Allyson Sabal in the setting of type 2 diabetes. I have been using Endoform for the last 3 or 4 weeks. He has a rim of epithelialization superiorly but I have not been able to advance this. He complains of pain but there is no clear infection 07/15/2016 -- the patient was here for a nurse visit but due to the presence of a new wound I was asked to see him for an opinion 07/23/16; the patient was seen last week by Dr. Meyer Russel in my absence. He had a new wound on his right dorsal ankle. Santyl was applied to this new area and also his original wound on the right anterior tibia. He arrives today complaining of a lot of pain 07/30/16; the patient has new wound on his right dorsal ankle from 2 weeks ago. This is as usual covered with a thick eschar. I think this is ischemic and I'm not expecting to be able to heal this. Santyl was applied for but denied by his insurance we will therefore use Medihoney. He only obtained this later today. He has left a wrap on  all week with Santyl. 08/06/16 both wounds in the same situation still a gelatinous surface slough. He finds the Medihoney makes him feel pain. He could not afford this Santyl opiate 08/20/16; patient arrives today frustrated about his nonhealing wounds now o2 on his right leg. He arrived here with a wound on the right anterior leg mid aspect and is developed a second area on the right dorsal ankle. I have reviewed Dr. Hazle Coca notes and his visit from August/17. There he makes clear reference to the fact that the vessels were not revascularizable. The patient also describes fairly clear claudication at rest which makes him sleep with his legs in a dependent position. He has given up smoking as of yesterday 09/03/16; patient is going for a second opinion on his vascular status of vein [facility] vascular next week. We have been using Medihoney to the wounds. He is tolerating his discomfort. 09/17/16; the patient is going to have an angiogram on January 30. Miraculously I can't really  see has had a recent angiogram although he has been to see Dr. Allyson Sabal who does not think here is cerebrovascular rise visible option. He does have stents in both legs that Dr. Allyson Sabal put and I think about 9 years ago. I would like to continue to use Medihoney until we see the results of his angiogram. 10/01/16; the patient had his angiogram. He has stent previously which was dilated and then a stent was placed in the right superficial femoral artery. He had failed balloon angioplasty of the right posterior tibial artery. A further stent was placed in the superficial femoral artery. An angioplasty was also done. He was noted to have severe stenosis versus occlusion of the posterior tibial artery which is the dominant vessel across the foot. Intervention was attempted here but was unsuccessful. If the patient is unable to heal his wounds it was felt he could be brought back for repeat attempt 10/15/16- patient is here for follow-up  evaluation of his right lower extremity and right dorsal foot ulcers. He was unable to make last week's appointment secondary to a death in the family. He has been using Iodosorb/Iodoflex dressing changes per home health. He states that his primary care physician follows an A1c every 3 months, with his last A1c being 7.8 which is elevated from the previous. He states he has a scheduled A1c for next month. He also states he has a follow-up appointment with [facility] in April 10/22/16- Mr. Machuca did have an xray of the right foot, revealing no definite evidence of bone destruction to suggest osteomyelitis. This was performed at [facility]. He has been wearing his show with a rolled tongue since the last visit when he leaves the home, otherwise he wears loose house slippers around the house. He is voicing no complaints or concerns since his last visit. He admits to smoking "maybe one a week" 10/29/16- patient is here for follow-up evaluation of his right lower extremity and right dorsal foot ulcers. He states that he has completely stopped smoking but does admit to dipping tobacco. We have not found out his coverage for both [employer] and Apligraf. He continues to offload this area with his walking shoe otherwise wears a slipper at home. He brought this (and it does appear to allow for relief of pressure to the dorsal part of his foot. 11/05/16; patient is here for follow-up of his right lower extremity and right dorsal foot ulcers. He has been revascularized. He no longer smokes. We have been using Prisma. 11/12/16- Mr. Tally arrives for follow up evaluation of his right lower extremity ulcers. We have been using Prisma. He is planned for an Apligraf application today. 11/19/16- Mr.Homeyer arrives for follow-up evaluation of his right lower extremity and right dorsal foot ulcer one week after Apligraf #1 application. He states the home health came out on Monday and change the foam, Kerlix and  Coban and and they did not interrupt the Adaptic touch. He has a follow-up appointment with ultrasound on Monday with vein and vascular 11/26/16- he is here for follow-up evaluation. He is s/p Apligraf #1 application 2 weeks ago. he had an appointment with vascular on Monday for arterial ultrasound. In evaluation of the stent to the right SFA which indicates biphasic waveform proximal to the stent and at the origin of the stent otherwise monophasic flow throughout the stent and at the distal aspect of the stent. An ABI done in that office on Monday was 0.73 (previously 0.47). He has biphasic  flow in the right posterior tibial and monophasic flow in the dorsalis pedis. He has a follow-up with Dr. Myra Gianotti on Monday, 4/9 12/03/16- He is here for follow-up evaluation. He did see Dr. Myra Gianotti on Monday and has a scheduled aortogram with lower extremity arteriogram on the 17th. He was not compressed over the last week. His wound culture came back Oxacillin sensitive staph aureus. He was prescribed Keflex and has been taking that without any voiced complaints. He states that he started taking his Lasix, although he did not contact or have an appointment with Dr. Nehemiah Settle. He does have an appointment in May 12/10/16- he is here for follow-up evaluation. He had an intervention on 4/17 with Dr. Myra Gianotti. According to his dictation 1) the patient has a widely patent right SFA and popliteal stent 2) after the an 80% occlusion of the right PTA was treated with atherectomy and balloon angioplasty with a residual stenosis of less than 5%. The patient is voicing significant improvement and overall feeling; states that he can now sleep in the bed without any symptoms. His completed his Keflex with no adverse reaction. He continues to self-regulate his Lasix, he is not on any potassium supplementation. 12/17/16; Arrives today with weeping edema thru the dorsal foot wound and lower leg. He has not been using his Lasix and  did not have compression on his leg. According to the patient he has 40mg  Lasix at home which he takes prn but not in weeks 12/21/16; less edema and no weeping edema. Apligraf #1 12/31/16; both wounds look somewhat better especially the area distally. Apligraf #2 01/14/17; Apligraf #3. Wounds looked improved. Patient saw main and vascular. ABIs on 01/11/17 done in their office showed a patent SFA stent status post atherectomy with angioplasty of the right PTA [12/08/16] biphasic flow on the right 0.95 monophasic flow on the left 0.45. 01/28/17; Apligraf #4. Wounds not particularly better than I remember. 02/11/17; Apligraf #5 the wounds have some improvement. The distal 1 has had some epithelialization proximal one is somewhat smaller. 02/25/17; he arrived in clinic today with a disappointing amount of surface necrotic material. This is especially true on the distal anterior foot/ankle wound. Change primary dressing the Hydrofera Blue today 03/04/17; some improvement in the proximal wound on the tibia. The distal wound on the dorsal foot/ankle has too much necrotic surface. We have been using Hydrofera Blue although there was apparently some problem with obtaining it through his home health company however the patient reports he now has it 03/11/17; wounds measure slightly smaller. Surface looks questionable although with the improvement in dimensions will likely continue with the [facility] without debridement today 03/18/17; no major change in these wounds. Questionable surface. Debridement today. Using [facility] 03/25/17; the patient has follow-up noninvasive vascular tests with vein and vascular later this month. The wounds himself are not measuring any different however the proximal wound bed looks better with the Iodoflex not much change with the area over the dorsal foot. 04/22/17; patient had repeat angiography on 04/20/17. He had a drug-coated balloon angioplasty of the right superficial  femoral artery balloon angioplasty right common femoral artery. He had stenosis of previously placed stents within the right superficial femoral artery this was treated with drug coated balloon angioplasty. He continues to have single vessel runoff via the peroneal artery He has been using Hydrofera Blue 04/29/17; no major change this week. Wound beds appear healthy. No debridement. He apparently did not tolerate Iodoflex which I was using prior to modification.  I'm going to change him to Endo form with hydrogel 05/13/17; wound beds again appear a lot better, paradoxically the wound on the dorsal foot appears to be progressing better than the proximal wound. Using Endoform with hydrogel 06/03/17; doing well. wounds smaller especially the wound on the dorsal foot. using endoform 06/17/17; two-week follow-up everything is going really nicely here. Using Endoform he only has one small open area left on the right dorsal foot/ankle. Also improved on the right anterior leg. He is retired from smoking since starting with us in this clinic 07/01/17; two-week follow-up the area on his right anterior foot/ankle is just about closed. The area superiorly on the lower mid calf as adherent necrotic debris which will need debridement but it is also down in terms of dimensions. We'll use Endoform 07/19/17 on evaluation today patient appears to be doing well with his Current wound care measures and there does not appear to be necrotic tissue which is good news. The Endoform however does seem to be causing some epithelialization although this is loose at the proximal/12 o'clock location and seems to be pulling off with dressing changes. No fevers, chills, nausea, or vomiting noted at this time. He has no significant discomfort. 08/05/17; the patient has 2 small open areas on the proximal foot and a larger wound on the right anterior leg. [facility] probably because of the hyper granulation  proximally 08/26/17; the patient has 2 small open areas on the proximal foot wound which is closed. The other is covered with a thin surface eschar. There is a similar wound on the right anterior leg. We've been using Hydrofera Blue 09/09/17*; the patient went for angiogram with Dr. Myra GianottiBrabham. Apparently a routine scheduled arterial duplex suggested occlusion of his stent however with the angiography the stent was widely patent. No procedure was necessary. He also apparently has had a long-standing pressure area on the left buttock which dates back to prior to Thanksgiving although he's never really said anything to anybody about this. He is been attempting to treated himself with triple antibiotic cream etc. We've been using silver collagen to the wounds on his right leg and dorsal right foot. These are largely unchanged today 09/23/17; patient's wounds are smaller in both areas and are just about closed. We have been using silver collagen he shows me an area on his buttock that is in the gluteal fold. This would be an atypical place for a pressure sore like a herpetic lesion although he says it's been there for a long time 2/14/19the patient's wound is totally closed. The area on the dorsal foot is also close to closing. I looked again at the area on his left buttock in the gluteal fold. He tells me that this is been there for 6 months. This more looks more like a pressure ulcer to me than it did when I looked at this 2 weeks ago. I simply put topical antibiotics on this but we are going to have to change to a more formal dressing 10/13/17 on evaluation today patient appears to be doing very well in regard to his right dorsal foot wound. He has been tolerating the dressing changes without complication. Fortunately there does not appear to be any evidence of infection which is good news. He continues to have a left gluteal ulcer as well as he has been tolerating the Prisma dressing on although this is  not significantly smaller it does sound like he's been putting other things on this  as well including some kind of frame he in fact this order another cream as well although I'm not exactly sure what this is. It's something that he "researched on the Internet that is supposed to make My wound healed him seven days." At worst his pain is a 2/10 in regard to the left gluteal region but this is mainly with cleansing of the wound. 10/21/17; his right dorsal foot wound is just about closed. The area on the right anterior shin area is closed. He has a left gluteal ulcer which is probably a pressure ulcer. He is been using his own creams that he spot on the Internet. This wound is also better. 11/04/17; this is a patient with severe PAD who is been revascularized by vein [facility] vascular. He had 2 difficult wound areas one on the right anterior shin and the right anterior dorsal foot. More recently he has developed a left gluteal ulcer which is probably a pressure ulcer although it has some atypical nature to it. His proximal right leg has remained healed. He has a small open area on the right dorsal foot with a nonviable surface 11/18/17; this is a patient with severe PAD who is been revascularized by [facility]. The area on the right proximal leg has healed over. He has a very small ischemic wound on the right dorsal foot which was the second wound. He also had a pressure ulcer on his buttock which is healed out as of today 12/02/17; wound on the dorsal foot which I think is largely ischemic. He is upset with me for doing a surface debridement on this last visit however he still has a nonviable surface on this and I think this is largely a blood flow issue 12/16/17; wound on the dorsal foot which I think is largely ischemic. He sees Dr. [place] Sink ham again next week. We've been using Iodoflex to this not much of a healthy surface. 12/30/17; the patient went back to see vascular surgery. He had noninvasive  testing. His ABI on the right was 0.70 compared to 0.60 last time on the left 0.57 compared to 0.44. TBI's were 0.3 on the right 0.29 on the left essentially unchanged from last time. Waveforms were monophasic. He was seen by a provider. Noted nonpalpable pulses. Overall they felt that they had slightly improved bilateral ABIs and right TBI. Her disease bilaterally with all waveforms monophasic. They will follow his lower extremity arterial duplex in 6 months. They're calling the areas on his feet venous stasis ulcers. He does not clearly describe claudication 01/13/18; The patient's wound is slightly larger in width and nonviable surface. We've been using Iodoflex. He has home help changing the dressings. I have been reluctant to do debridement on this since he blames a debridement for reopening his wound actually still think this is arterial even given recent arterial results and evaluation [see above) 01/27/18; using iodoflex. better surface. 02/10/18; using Iodoflex. Surface is some better. Changed him to silver collagen today 03/02/18 on evaluation today patient continues to show signs of slow but good progress in regard to his left lower extremity ulcer. He has been tolerating the dressing changes without complication which is good news. He does have somewhat limited/poor vascular flow but nonetheless he does seem to be healing just at a much slower place and what would be otherwise recommended and expected. I do believe the silver collagen dressing change has been of benefit. 03/22/18; slow but good progress in terms of his right anterior foot  wound. These are largely ischemic wounds but he has been revascularized. He is continued with silver collagen 04/07/18 on evaluation today patient appears to be doing rather well in regard to the wound on the dorsal surface of his right foot. He's been tolerating the dressing changes without complication. Fortunately there does not appear to be the evidence  of infection at this time which is great news. Overall I'm very pleased with how things seem to appear at this point. 04/21/18; dorsal right foot wound secondary to PAD. Wound is making progress by 2 mm. Surface of it is passable. He is been using silver collagen 05/05/18; we received a call from home health this week that the original proximal tibial area had reopened on the right. He now has 2 open areas including the one we've been watching for a long period on the foot. The new open area seems to be right in the same place as the original wound previously was located 05/13/2018; wounds are both actually larger. I debrided these last week both wounds are bigger. The surface looks better I been using Iodoflex. 05/27/2018; the wounds are larger but the surface of them looks better with Iodoflex. He has a follow-up with Dr. Myra GianottiBrabham sometime in November. I tried to move these up we were not able to get an earlier appointment I will try to text him. 06/16/2018; the patient's wounds are larger but with a generally cleaner surface. He has extensive set of investigations with Dr. Myra GianottiBrabham on November 5. This includes ABIs TBI's and I believe an ultrasound of the stent. 07/04/2018; the patient apparently stated he felt too dizzy to go for his arterial studies on 11/5 therefore he canceled him. He has not been able to rebook them. I suspect these are ischemic wounds and I told him this. 09/01/2018; he is not been here in almost 2 months. He has been getting his dressing changed by [facility] I believe silver collagen. They are discharging him next week due to nonimprovement of the wounds predominantly. He did not follow-up with vascular surgery he was supposed to have follow-up arterial studies noninvasive. I am not sure that they have been rebooked and I do not think he has been seen. 1/23; patient's wounds are slightly larger. I believe this is ischemic. He canceled his last follow-up appointment with Dr.  Myra GianottiBrabham him that included noninvasive arterial studies. He says this was because of vertigo. He has not rescheduled these. He also has lost home health because of the chronicity of his wounds 2/6; the patient's wound on his right anterior tibia actually looks quite good. Not much difference in size but the surface of the wound looks good. He has a small area on the dorsal foot debris on the surface of this which I debrided with Anasept and gauze. He has not made a follow-up with vein and vascular 2/20; the patient's wound on his right anterior tibia continues to look good although it is not coming down in size much. The area on his right dorsal foot is about the same. Tightly adherent debris. The wound surface does not look healthy care. The patient is expressed his to sustain for mechanical debridement in the past, I therefore been avoiding this at least until he goes back for repeat vascular appointment. So far he has not done this he is mingling for ENT appointments related to vertigo 3/5; right anterior tibia continues to look satisfactory perhaps slightly smaller. The area on the right dorsal foot is smaller. We have  been using Hydrofera Blue. 4/16; the patient wounds have actually done well in the hiatus since we have last seen him. He is using Hydrofera Blue ready 6/25; we have not seen this patient in over 2 months as he has been in the isolation phase for the Covid epidemic. He is using Hydrofera Blue ready. He has 2 wounds right on the right anterior mid tibia and one on the right dorsal foot. Both of these are somewhat improved today. Especially the dorsal 1 over the tibia. He has not been for follow- up arterial studies but I have got him to agree to allow [place] to set these up. I 7/16; the patient's wound proximally in the mid tibia area has closed over once again. We had this closed over before and he reopened the area on the dorsal foot appears better. He has been using Hydrofera Blue.  We do not have an appointment for the follow-up arterial studies I ordered nor do I think he is really all that interested in getting them done. 7/30; patient has been using Hydrofera Blue. The dorsal foot wound looks some better however he has had a reopening in the mid tibia area. This is been closed over the last time. The opening is very superficial. We have been trying to get him follow-up vascular studies done with [facility] for about a year. He refused to do these at one point because he was at work getting worked up for vertigo. He is now more ready to move forward with this. 8/20; patient is using Hydrofera Blue. He has not made an appointment for his follow-up noninvasive vascular studies and I have talked to him about this again. Both wound areas are still open on the right mid tibia and the right dorsal foot although I think they are smaller 05/04/19-Patient is back after his vascular visit his studies revealed that he is got ABI of 0.6 on the right, 0.46 on the left, monophasic flow to both right and left anterior tibial arteries is got great pressures to the right great toe. 10/8; we still do not have a vascular surgery appointment 1 month later. I think he may require a repeat angiogram his wounds on the right anterior to mid tibia is quite a bit larger the area on the right dorsal ankle, is small. Very little open area here 11/12; patient has not been here in over a month. He still did not have a vascular surgery appointment however before he left the clinic today we managed to arrange that for 12/9. The area on his right dorsal foot is surprisingly closed over today however the area in the mid tibia is quite a bit larger than when we saw this last time. 12/3; patient has a vascular appointment next week in follow-up from his previous revascularization. The area on the right dorsal foot remains closed. He still has a fairly sizable wound on the anterior tibia but it is  superficial. We have been using silver collagen 12/28. The patient saw Langston Reusing the nurse practitioner at [facility]. Noted that the most recent ABIs were in September 2020 which showed a mild decline bilaterally a 0.61 on the right with monophasic waveforms and 0.46 on the left with monophasic waveforms. It was felt that the area on his right lower leg was a venous ulcer. The area on the dorsal foot remains closed. Follow-up ABIs were ordered for 6 months. We have been ordering silver collagen 1/28; monthly follow-up. Not much change  in the wound on the anterior mid tibia. We have been using silver collagen for a prolonged period of time now. He has been reviewed by vascular with regards to his known PAD. They did not think that he needed any further interventions for now and ordered follow-up ABIs in 6 months. We did manage to heal the area on the dorsal foot and that remains closed Objective Constitutional Patient is hypertensive.. Pulse regular and within target range for patient.Marland Kitchen Respirations regular, non-labored and within target range.. Temperature is normal and within the target range for the patient.Marland Kitchen Appears in no distress. Vitals Time Taken: 2:50 PM, Height: 61 in, Weight: 122 lbs, BMI: 23, Temperature: 98.2 F, Pulse: 79 bpm, Respiratory Rate: 18 breaths/min, Blood Pressure: 153/73 mmHg. General Notes: Wound exam; I do not feel the pulses in his feet however I did feel a popliteal pulse today. The tibial wound is debrided with a #5 curette not a very viable surface. Subcutaneous debris removed hemostasis with direct pressure but truthfully there was minimal bleeding Integumentary (Hair, Skin) Wound #5R status is Open. Original cause of wound was Gradually Appeared. The wound is located on the Right,Anterior Lower Leg. The wound measures 2.5cm length x 2.8cm width x 0.1cm depth; 5.498cm^2 area and 0.55cm^3 volume. Assessment Active Problems ICD-10 Non-pressure  chronic ulcer of right calf with necrosis of muscle Type 2 diabetes mellitus with other skin ulcer Type 2 diabetes mellitus with foot ulcer Type 2 diabetes mellitus with diabetic peripheral angiopathy without gangrene Procedures Wound #5R Pre-procedure diagnosis of Wound #5R is a Diabetic Wound/Ulcer of the Lower Extremity located on the Right,Anterior Lower Leg .Severity of Tissue Pre Debridement is: Fat layer exposed. There was a Excisional Skin/Subcutaneous Tissue Debridement with a total area of 7 sq cm performed by Maxwell Caul., MD. With the following instrument(s): Curette to remove Viable and Non-Viable tissue/material. Material removed includes Subcutaneous Tissue, Slough, Skin: Dermis, and Fibrin/Exudate after achieving pain control using Other (benzocaine 20%). A time out was conducted at 15:31, prior to the start of the procedure. A Moderate amount of bleeding was controlled with Pressure. The procedure was tolerated well with a pain level of 0 throughout and a pain level of 2 following the procedure. Post Debridement Measurements: 2.5cm length x 2.8cm width x 0.1cm depth; 0.55cm^3 volume. Character of Wound/Ulcer Post Debridement is improved. Severity of Tissue Post Debridement is: Fat layer exposed. Post procedure Diagnosis Wound #5R: Same as Pre-Procedure Plan Follow-up Appointments: Return appointment in 3 weeks. Dressing Change Frequency: Wound #5R Right,Anterior Lower Leg: Other: - twice a week Skin Barriers/Peri-Wound Care: Barrier cream - apply zinc oxide around the wound bed for wetness, reddness, or maceration. Moisturizing lotion - to leg Wound Cleansing: Wound #5R Right,Anterior Lower Leg: May shower with protection. Primary Wound Dressing: Wound #5R Right,Anterior Lower Leg: Polymem Secondary Dressing: Wound #5R Right,Anterior Lower Leg: Foam Border - or bordered gauze. Edema Control: Avoid standing for long periods of time Elevate legs to the level  of the heart or above for 30 minutes daily and/or when sitting, a frequency of: - throughout the day Other: - patient to purchase support stockings for protection. Off-Loading: Turn and reposition every 2 hours 1. I switch the dressing to polymen covered with border foam. 2. We will change this himself every second day. 3. Follow-up in 3 weeks Electronic Signature(s) Signed: 09/21/2019 5:54:40 PM By: Baltazar Najjar MD Entered By: Baltazar Najjar on 09/21/2019 17:15:53 -------------------------------------------------------------------------------- SuperBill Details Patient Name: Date of Service: Reginald Ayers 09/21/2019  Medical Record [id number] Patient Account Number: [id number] Date of Birth/Sex: Treating RN: 04/18/1951 (69 y.o. Harlon Flor, Millard.Loa Primary Care Provider: Katy Apo Other Clinician: Referring Provider: Treating Provider/Extender:Seneca Hoback, [name], [name]: 184 Diagnosis Coding ICD-10 Codes Code Description [phone] Non-pressure chronic ulcer of right calf with necrosis of muscle E11.622 Type 2 diabetes mellitus with other skin ulcer E11.621 Type 2 diabetes mellitus with foot ulcer E11.51 Type 2 diabetes mellitus with diabetic peripheral angiopathy without gangrene Facility Procedures The patient participates with Medicare or their insurance follows the Medicare Facility Guidelines: CPT4 Code Description Modifier Quantity [id number] 11042 - DEB SUBQ TISSUE 20 SQ CM/< 1 ICD-10 Diagnosis Description L97.213 Non-pressure chronic ulcer of  right calf with necrosis of muscle Physician Procedures CPT4 Code Description: [id number] 11042 - WC PHYS SUBQ TISS 20 SQ CM ICD-10 Diagnosis Description L97.213 Non-pressure chronic ulcer of right calf with necrosis of Modifier: muscle Quantity: 1 Electronic Signature(s) Signed: 09/21/2019 5:54:40 PM By: Baltazar Najjar MD Entered By: Baltazar Najjar on 09/21/2019 17:16:04

## 2019-09-22 NOTE — Progress Notes (Signed)
Reginald Ayers, Reginald Ayers ([id number]) Visit Report for 09/21/2019 Arrival Information Details Patient Name: Date of Service: Reginald Ayers, Reginald Ayers 09/21/2019 2:30 PM Medical Record [id number] Patient Account Number: [id number] Date of Birth/Sex: Treating RN: 1951-03-26 (69 y.o. Damaris Schooner Primary Care Kimmy Totten: Katy Apo Other Clinician: Referring Skylie Hiott: Treating Siya Flurry/Extender:Robson, [name], [name]: 184 Visit Information History Since Last Visit Added or deleted any medications: No Patient Arrived: Ambulatory Any new allergies or adverse reactions: No Arrival Time: 14:47 Had a fall or experienced change in No Accompanied By: self activities of daily living that may affect Transfer Assistance: None risk of falls: Patient Identification Verified: Yes Signs or symptoms of abuse/neglect since last No Secondary Verification Process Yes visito Completed: Hospitalized since last visit: No Patient Requires Transmission- No Implantable device outside of the clinic excluding No Based Precautions: cellular tissue based products placed in the center Patient Has Alerts: Yes since last visit: Patient Alerts: Patient on Blood Has Dressing in Place as Prescribed: Yes Thinner Pain Present Now: No Electronic Signature(s) Signed: 09/22/2019 8:33:08 AM By: Zenaida Deed RN, BSN Entered By: Zenaida Deed on 09/21/2019 14:48:10 -------------------------------------------------------------------------------- Encounter Discharge Information Details Patient Name: Date of Service: Reginald Ayers 09/21/2019 2:30 PM Medical Record [id number] Patient Account Number: [id number] Date of Birth/Sex: Treating RN: 01/30/1951 (69 y.o. [name] Primary Care Loanne Emery: Katy Apo Other Clinician: Referring Jhalen Eley: Treating Jiovany Scheffel/Extender:Robson, [name], [name]: 184 Encounter Discharge Information Items  Post Procedure Vitals Discharge Condition: Stable Temperature (F): 98.2 Ambulatory Status: Ambulatory Pulse (bpm): 79 Discharge Destination: Home Respiratory Rate (breaths/min): 18 Transportation: Private Auto Blood Pressure (mmHg): 153/73 Accompanied By: self Schedule Follow-up Appointment: Yes Clinical Summary of Care: Electronic Signature(s) Signed: 09/21/2019 6:05:55 PM By: Shawn Stall Entered By: Shawn Stall on 09/21/2019 17:19:44 -------------------------------------------------------------------------------- Lower Extremity Assessment Details Patient Name: Date of Service: Reginald Ayers, Reginald Ayers 09/21/2019 2:30 PM Medical Record [id number] Patient Account Number: [id number] Date of Birth/Sex: Treating RN: 1950-12-04 (69 y.o. Damaris Schooner Primary Care Ilhan Madan: Katy Apo Other Clinician: Referring Finlee Concepcion: Treating Vardaan Depascale/Extender:Robson, [name], [name]: 184 Edema Assessment Assessed: [Left: No] [Right: No] Edema: [Left: Ye] [Right: s] Calf Left: Right: Point of Measurement: 30 cm From Medial Instep cm 29 cm Ankle Left: Right: Point of Measurement: 10 cm From Medial Instep cm 18.7 cm Vascular Assessment Pulses: Dorsalis Pedis Palpable: [Right:Yes] Electronic Signature(s) Signed: 09/22/2019 8:33:08 AM By: Zenaida Deed RN, BSN Entered By: Zenaida Deed on 09/21/2019 14:53:46 -------------------------------------------------------------------------------- Multi Wound Chart Details Patient Name: Date of Service: Reginald Ayers 09/21/2019 2:30 PM Medical Record [id number] Patient Account Number: [id number] Date of Birth/Sex: Treating RN: 1951-08-05 (69 y.o. 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] Date Acquired: [5R:04/21/2018] [N/A:N/A] Weeks of Treatment: [5R:72] [N/A:N/A] Wound Status: [5R:Open] [N/A:N/A] Wound Recurrence: [5R:Yes] [N/A:N/A] Measurements L x W x D 2.5x2.8x0.1 [N/A:N/A] (cm) Area (cm) : [5R:5.498] [N/A:N/A] Volume (cm) : [5R:0.55] [N/A:N/A] % Reduction in Area: [5R:-483.70%] [N/A:N/A] % Reduction in Volume: -485.10% [N/A:N/A] Classification: [5R:Grade 2] [N/A:N/A] Debridement: [5R:Debridement - Excisional N/A] Pre-procedure [5R:15:31] [N/A:N/A] Verification/Time Out Taken: Pain Control: [5R:Other] [N/A:N/A] Tissue Debrided: [5R:Subcutaneous, Slough] [N/A:N/A]  Level: [5R:Skin/Subcutaneous Tissue N/A] Debridement Area (sq cm):7 [N/A:N/A] Instrument: [5R:Curette] [N/A:N/A] Bleeding: [5R:Moderate] [N/A:N/A] Hemostasis Achieved: [5R:Pressure] [N/A:N/A] Procedural Pain: [5R:0] [N/A:N/A] Post Procedural Pain: [5R:2] [N/A:N/A] Debridement Treatment Procedure was tolerated [N/A:N/A] Response: [5R:well] Post Debridement [5R:2.5x2.8x0.1] [N/A:N/A] Measurements L x W x D (cm) Post Debridement [5R:0.55] [N/A:N/A] Volume: (cm) Procedures Performed: Debridement [N/A:N/A] Treatment Notes Wound #5R (Right, Anterior Lower Leg) 1. Cleanse With Wound Cleanser 2. Periwound Care Skin Prep 3. Primary Dressing Applied Polymem 4. Secondary Dressing Foam Border Dressing 5. Secured With Self Adhesive Bandage Notes explained the new dressing applied. patient in agreement. Electronic Signature(s) Signed: 09/21/2019 5:54:40 PM By: Baltazar Najjar MD Entered By: Baltazar Najjar on 09/21/2019  17:12:36 -------------------------------------------------------------------------------- Multi-Disciplinary Care Plan Details Patient Name: Date of Service: Reginald Ayers 09/21/2019 2:30 PM Medical Record [id number] Patient Account Number: [id number] Date of Birth/Sex: Treating RN: 02/04/1951 (69 y.o. [name] Primary Care Vishal Sandlin: Katy Apo Other Clinician: Referring Kimmie Doren: Treating Annalee Meyerhoff/Extender:Robson, [name], [name]: 184 Active Inactive Nutrition Nursing Diagnoses: Potential for alteratiion in Nutrition/Potential for imbalanced nutrition Goals: Patient/caregiver agrees to and verbalizes understanding of need to obtain nutritional consultation Date Initiated: 03/09/2019 Target Resolution Date: 10/27/2019 Goal Status: Active Interventions: Provide education on nutrition Treatment Activities: Education provided on Nutrition : 07/06/2019 Patient referred to Primary Care Physician for further nutritional evaluation : 03/09/2019 Notes: Electronic Signature(s) Signed: 09/21/2019 6:05:55 PM By: Shawn Stall Entered By: Shawn Stall on 09/21/2019 14:34:47 -------------------------------------------------------------------------------- Pain Assessment Details Patient Name: Date of Service: Reginald Ayers, Reginald Ayers 09/21/2019 2:30 PM Medical Record [id number] Patient Account Number: [id number] Date of Birth/Sex: Treating RN: 1951-07-23 (69 y.o. Damaris Schooner Primary Care Nikholas Geffre: Katy Apo Other Clinician: Referring Lenzy Kerschner: Treating Shirleyann Montero/Extender:Robson, [name], [name]: 184 Active Problems Location of Pain Severity and Description of Pain Patient Has Paino No Site Locations Rate the pain. Current Pain Level: 0 Pain Management and Medication Current Pain Management: Electronic Signature(s) Signed: 09/22/2019 8:33:08 AM By: Zenaida Deed RN, BSN Entered By: Zenaida Deed on 09/21/2019 14:52:50 -------------------------------------------------------------------------------- Patient/Caregiver Education Details Patient Name: Date of Service: Reginald Ayers 1/28/2021andnbsp2:30 PM Medical Record [id number] Patient Account Number: [id number] Date of Birth/Gender: [birth year] (69 y.o. M) Treating RN: Shawn Stall Primary Care Physician: Katy Apo Other Clinician: Referring Physician: Treating Physician/Extender:Robson, [name], [name]: 184 Education Assessment Education Provided To: Patient Education Topics Provided Nutrition: Handouts: Nutrition Methods: Explain/Verbal Responses: Reinforcements needed Electronic Signature(s) Signed: 09/21/2019 6:05:55 PM By: Shawn Stall Entered By: Shawn Stall on 09/21/2019 14:35:01 -------------------------------------------------------------------------------- Wound Assessment Details Patient Name: Date of Service: Reginald Ayers, Reginald Ayers 09/21/2019 2:30 PM Medical Record [id number] Patient Account Number: [id number] Date of Birth/Sex: Treating RN: 08-12-1951 (69 y.o. Damaris Schooner Primary Care Kassity Woodson: Katy Apo Other Clinician: Referring Sharicka Pogorzelski: Treating Areg Bialas/Extender:Robson, [name], [name]: 184 Wound Status Wound Number: 5R Primary Diabetic Wound/Ulcer of the Lower Etiology: Extremity Wound Location: Right, Anterior Lower Leg Wound Status: Open Wounding Event: Gradually Appeared Date Acquired: 04/21/2018 Weeks Of Treatment: 72 Clustered Wound: No Wound Measurements Length: (cm) 2.5 Width: (cm) 2.8 Depth: (cm) 0.1 Area: (cm) 5.498 Volume: (cm) 0.55 Wound Description Classification: Grade 2 % Reduction in Area: -483.7% % Reduction in Volume: -485.1% Treatment Notes Wound #5R (Right, Anterior Lower Leg) 1. Cleanse With Wound Cleanser 2. Periwound Care Skin Prep 3. Primary Dressing  Applied Polymem 4. Secondary Dressing Foam Border Dressing 5. Secured With Self Adhesive Bandage Notes explained the new dressing  applied. patient in agreement. Electronic Signature(s) Signed: 09/22/2019 8:33:08 AM By: Baruch Gouty RN, BSN Entered By: Baruch Gouty on 09/21/2019 15:03:07 -------------------------------------------------------------------------------- Vitals Details Patient Name: Date of Service: Reginald Ayers 09/21/2019 2:30 PM Medical Record [id number] Patient Account Number: [id number] Date of Birth/Sex: Treating RN: 13-Jul-1951 (69 y.o. Ernestene Mention Primary Care Jhania Etherington: Kandice Hams Other Clinician: Referring Lateef Juncaj: Treating Tiziana Cislo/Extender:Robson, Alice Reichert, Lowella Dandy in Treatment: 184 Vital Signs Time Taken: 14:50 Temperature (F): 98.2 Height (in): 61 Pulse (bpm): 79 Weight (lbs): 122 Respiratory Rate (breaths/min): 18 Body Mass Index (BMI): 23 Blood Pressure (mmHg): 153/73 Reference Range: 80 - 120 mg / dl Electronic Signature(s) Signed: 09/22/2019 8:33:08 AM By: Baruch Gouty RN, BSN Entered By: Baruch Gouty on 09/21/2019 14:52:41

## 2019-10-12 ENCOUNTER — Encounter (HOSPITAL_BASED_OUTPATIENT_CLINIC_OR_DEPARTMENT_OTHER): Payer: Medicare Other | Admitting: Internal Medicine

## 2019-10-19 ENCOUNTER — Other Ambulatory Visit: Payer: Self-pay

## 2019-10-19 ENCOUNTER — Encounter (HOSPITAL_BASED_OUTPATIENT_CLINIC_OR_DEPARTMENT_OTHER): Payer: Medicare Other | Attending: Internal Medicine | Admitting: Internal Medicine

## 2019-10-19 DIAGNOSIS — L97812 Non-pressure chronic ulcer of other part of right lower leg with fat layer exposed: Secondary | ICD-10-CM | POA: Diagnosis not present

## 2019-10-19 DIAGNOSIS — I739 Peripheral vascular disease, unspecified: Secondary | ICD-10-CM | POA: Diagnosis not present

## 2019-10-19 DIAGNOSIS — E11622 Type 2 diabetes mellitus with other skin ulcer: Secondary | ICD-10-CM | POA: Insufficient documentation

## 2019-10-19 DIAGNOSIS — E11621 Type 2 diabetes mellitus with foot ulcer: Secondary | ICD-10-CM | POA: Diagnosis not present

## 2019-10-19 DIAGNOSIS — Z955 Presence of coronary angioplasty implant and graft: Secondary | ICD-10-CM | POA: Insufficient documentation

## 2019-10-19 DIAGNOSIS — E1151 Type 2 diabetes mellitus with diabetic peripheral angiopathy without gangrene: Secondary | ICD-10-CM | POA: Diagnosis not present

## 2019-10-19 DIAGNOSIS — Z951 Presence of aortocoronary bypass graft: Secondary | ICD-10-CM | POA: Diagnosis not present

## 2019-10-19 DIAGNOSIS — L97213 Non-pressure chronic ulcer of right calf with necrosis of muscle: Secondary | ICD-10-CM | POA: Insufficient documentation

## 2019-10-19 DIAGNOSIS — F17210 Nicotine dependence, cigarettes, uncomplicated: Secondary | ICD-10-CM | POA: Insufficient documentation

## 2019-10-19 NOTE — Progress Notes (Signed)
Reginald Ayers, Reginald Ayers ([id number]) Visit Report for 10/19/2019 HPI Details Patient Name: Date of Service: Reginald Ayers, Reginald Ayers 10/19/2019 2:30 PM Medical Record [id number] Patient Account Number: [id number] Date of Birth/Sex: Treating RN: 01-Mar-1951 (69 y.o. [name] Primary Care Provider: Katy Apo Other Clinician: Referring Provider: Treating Provider/Extender:Harman Ferrin, [name], [name]: 188 History of Present Illness HPI Description: 03/06/16; this is a patient who is a type II diabetic with a recent hemoglobin A1c of 7.6. He is referred from Dr. Allyson Sabal for aggressive wound care on a wound on his right anterior leg. He has known severe PAD and has an extensive interventional history. His left common iliac artery was stented in December 2003 and his right superficial femoral artery in April 2008. He has significant infrapopliteal disease bilaterally and has known occluded left SFA. His ABIs from September 2016 showed a ABI of 0.48 on the right and 0.52 on the left although he does not really describe claudication. He also has known carotid artery disease, coronary artery disease and is status post CABG in 2010. He tells me he smokes 3 cigarettes a day. The patient tells me his at the wound on the right anterior leg for about 6 months. He has a smaller area on the left medial leg that has been present for one month. He's been to see his primary physician Dr. polite and Dr. Allyson Sabal I am not clear what he is been using on this. It is felt that his ulcer is an ischemic ulcer on the pretibial region. It is noted that if we are unable to heal this it was felt he would need repeat angiography and potential percutaneous revascularization. 03/13/16; the area on the left medial leg is healed over. His ischemic wound on the right anterior leg is debridement of a lot of nonviable subcutaneous tissue we've been using Santyl 03/20/16; he has no open area on the left medial  leg. The wound on the right anterior leg looks ischemic. A lot of nonviable tissue. Our intake nurse reported that he had a lot of dried blood clots around the wound presumably from last week's debridement. There is no evidence of infection 04/16/16; the area on the right anterior leg is an ischemic wound. He is been back to see Dr. Allyson Sabal who states he is not a candidate for revascularization. He has a large area of occlusion from his groin down which cannot be revascularized. Since Dr. Allyson Sabal is aggressive with this type of situation I can only surmised that that's the correct decision 05/01/16 the area on the right leg is an ischemic wound. He has not a candidate for revascularization. He has not smoked in 48 hours. Has been using Santyl wound size is not any smaller however this surface looks somewhat better 05/08/16; ischemic wound on the right anterior leg. He is not a candidate for revascularization. He continues with nonsmoking for the last week. I use Prisma last week although that probably wasn't a good decision, covered in a difficult surface eschar today requiring debridement 05/15/16; patient with ischemic wound on his right anterior leg. He is not a candidate for revascularization. Complains of a lot of pain this week/burning over the wound area making it difficult for him to sleep 05/21/16; patient states less pain this week. The wound had circumferential fibrinous slough that I removed along with some nonviable subcutaneous tissue. We used Hydrofera Blue last week 05/28/16; patient states he has not had any pain. Some minor reduction in the area.  Still using Hydrofera Blue 06/04/16; no pain still using Hydrofera Blue 06/11/16 no complaints still using Hydrofera Blue 06/18/16; I changed him to Endoform last week. 06/25/16; wound still appears healthy. Minor debridement this week still using Endoform 07/02/16; the wound bed certainly appears healthier. Rim of epithelialization superiorly was  there last week. It certainly has not progressed any further. Using Endoform 07/09/16 patient is here for a review of the wound on the right anterior leg. He has a history of severe and prolonged PAD followed by Dr. Allyson Sabal in the setting of type 2 diabetes. I have been using Endoform for the last 3 or 4 weeks. He has a rim of epithelialization superiorly but I have not been able to advance this. He complains of pain but there is no clear infection 07/15/2016 -- the patient was here for a nurse visit but due to the presence of a new wound I was asked to see him for an opinion 07/23/16; the patient was seen last week by Dr. Meyer Russel in my absence. He had a new wound on his right dorsal ankle. Santyl was applied to this new area and also his original wound on the right anterior tibia. He arrives today complaining of a lot of pain 07/30/16; the patient has new wound on his right dorsal ankle from 2 weeks ago. This is as usual covered with a thick eschar. I think this is ischemic and I'm not expecting to be able to heal this. Santyl was applied for but denied by his insurance we will therefore use Medihoney. He only obtained this later today. He has left a wrap on all week with Santyl. 08/06/16 both wounds in the same situation still a gelatinous surface slough. He finds the Medihoney makes him feel pain. He could not afford this Santyl opiate 08/20/16; patient arrives today frustrated about his nonhealing wounds now 2 on his right leg. He arrived here with a wound on the right anterior leg mid aspect and is developed a second area on the right dorsal ankle. I have reviewed Dr. Hazle Coca notes and his visit from August/17. There he makes clear reference to the fact that the vessels were not revascularizable. The patient also describes fairly clear claudication at rest which makes him sleep with his legs in a dependent position. He has given up smoking as of yesterday 09/03/16; patient is going for a second  opinion on his vascular status of vein and vascular next week. We have been using Medihoney to the wounds. He is tolerating his discomfort. 09/17/16; the patient is going to have an angiogram on January 30. Miraculously I can't really see has had a recent angiogram although he has been to see Dr. Allyson Sabal who does not think here is cerebrovascular rise visible option. He does have stents in both legs that Dr. Allyson Sabal put and I think about 9 years ago. I would like to continue to use Medihoney until we see the results of his angiogram. 10/01/16; the patient had his angiogram. He has stent previously which was dilated and then a stent was placed in the right superficial femoral artery. He had failed balloon angioplasty of the right posterior tibial artery. A further stent was placed in the superficial femoral artery. An angioplasty was also done. He was noted to have severe stenosis versus occlusion of the posterior tibial artery which is the dominant vessel across the foot. Intervention was attempted here but was unsuccessful. If the patient is unable to heal his wounds it was felt he  could be brought back for repeat attempt 10/15/16- patient is here for follow-up evaluation of his right lower extremity and right dorsal foot ulcers. He was unable to make last week's appointment secondary to a death in the family. He has been using Iodosorb/Iodoflex dressing changes per home health. He states that his primary care physician follows an A1c every 3 months, with his last A1c being 7.8 which is elevated from the previous. He states he has a scheduled A1c for next month. He also states he has a follow-up appointment with Vein and Vascular in April 10/22/16- Mr. Hymes did have an xray of the right foot, revealing no definite evidence of bone destruction to suggest osteomyelitis. This was performed at [facility]. He has been wearing his show with a rolled tongue since the last visit when he leaves the home,  otherwise he wears loose house slippers around the house. He is voicing no complaints or concerns since his last visit. He admits to smoking "maybe one a week" 10/29/16- patient is here for follow-up evaluation of his right lower extremity and right dorsal foot ulcers. He states that he has completely stopped smoking but does admit to dipping tobacco. We have not found out his coverage for both [employer] and Apligraf. He continues to offload this area with his walking shoe otherwise wears a slipper at home. He brought this (and it does appear to allow for relief of pressure to the dorsal part of his foot. 11/05/16; patient is here for follow-up of his right lower extremity and right dorsal foot ulcers. He has been revascularized. He no longer smokes. We have been using Prisma. 11/12/16- Mr. Fulco arrives for follow up evaluation of his right lower extremity ulcers. We have been using Prisma. He is planned for an Apligraf application today. 11/19/16- Mr.Klug arrives for follow-up evaluation of his right lower extremity and right dorsal foot ulcer one week after Apligraf #1 application. He states the home health came out on Monday and change the foam, Kerlix and Coban and and they did not interrupt the Adaptic touch. He has a follow-up appointment with ultrasound on Monday with vein and vascular 11/26/16- he is here for follow-up evaluation. He is s/p Apligraf #1 application 2 weeks ago. he had an appointment with vascular on Monday for arterial ultrasound. In evaluation of the stent to the right SFA which indicates biphasic waveform proximal to the stent and at the origin of the stent otherwise monophasic flow throughout the stent and at the distal aspect of the stent. An ABI done in that office on Monday was 0.73 (previously 0.47). He has biphasic flow in the right posterior tibial and monophasic flow in the dorsalis pedis. He has a follow-up with Dr. Myra Gianotti on Monday, 4/9 12/03/16- He is here for  follow-up evaluation. He did see Dr. Myra Gianotti on Monday and has a scheduled aortogram with lower extremity arteriogram on the 17th. He was not compressed over the last week. His wound culture came back Oxacillin sensitive staph aureus. He was prescribed Keflex and has been taking that without any voiced complaints. He states that he started taking his Lasix, although he did not contact or have an appointment with Dr. Nehemiah Settle. He does have an appointment in May 12/10/16- he is here for follow-up evaluation. He had an intervention on 4/17 with Dr. Myra Gianotti. According to his dictation 1) the patient has a widely patent right SFA and popliteal stent 2) after the an 80% occlusion of the right PTA was treated with  atherectomy and balloon angioplasty with a residual stenosis of less than 5%. The patient is voicing significant improvement and overall feeling; states that he can now sleep in the bed without any symptoms. His completed his Keflex with no adverse reaction. He continues to self-regulate his Lasix, he is not on any potassium supplementation. 12/17/16; Arrives today with weeping edema thru the dorsal foot wound and lower leg. He has not been using his Lasix and did not have compression on his leg. According to the patient he has 40mg  Lasix at home which he takes prn but not in weeks 12/21/16; less edema and no weeping edema. Apligraf #1 12/31/16; both wounds look somewhat better especially the area distally. Apligraf #2 01/14/17; Apligraf #3. Wounds looked improved. Patient saw main and vascular. ABIs on 01/11/17 done in their office showed a patent SFA stent status post atherectomy with angioplasty of the right PTA [12/08/16] biphasic flow on the right 0.95 monophasic flow on the left 0.45. 01/28/17; Apligraf #4. Wounds not particularly better than I remember. 02/11/17; Apligraf #5 the wounds have some improvement. The distal 1 has had some epithelialization proximal one is somewhat smaller. 02/25/17; he  arrived in clinic today with a disappointing amount of surface necrotic material. This is especially true on the distal anterior foot/ankle wound. Change primary dressing the Hydrofera Blue today 03/04/17; some improvement in the proximal wound on the tibia. The distal wound on the dorsal foot/ankle has too much necrotic surface. We have been using Hydrofera Blue although there was apparently some problem with obtaining it through his home health company however the patient reports he now has it 03/11/17; wounds measure slightly smaller. Surface looks questionable although with the improvement in dimensions will likely continue with the [facility] without debridement today 03/18/17; no major change in these wounds. Questionable surface. Debridement today. Using [facility] 03/25/17; the patient has follow-up noninvasive vascular tests with vein and vascular later this month. The wounds himself are not measuring any different however the proximal wound bed looks better with the Iodoflex not much change with the area over the dorsal foot. 04/22/17; patient had repeat angiography on 04/20/17. He had a drug-coated balloon angioplasty of the right superficial femoral artery balloon angioplasty right common femoral artery. He had stenosis of previously placed stents within the right superficial femoral artery this was treated with drug coated balloon angioplasty. He continues to have single vessel runoff via the peroneal artery He has been using Hydrofera Blue 04/29/17; no major change this week. Wound beds appear healthy. No debridement. He apparently did not tolerate Iodoflex which I was using prior to modification. I'm going to change him to Endo form with hydrogel 05/13/17; wound beds again appear a lot better, paradoxically the wound on the dorsal foot appears to be progressing better than the proximal wound. Using Endoform with hydrogel 06/03/17; doing well. wounds smaller especially the wound on  the dorsal foot. using endoform 06/17/17; two-week follow-up everything is going really nicely here. Using Endoform he only has one small open area left on the right dorsal foot/ankle. Also improved on the right anterior leg. He is retired from smoking since starting with 06/19/17 in this clinic 07/01/17; two-week follow-up the area on his right anterior foot/ankle is just about closed. The area superiorly on the lower mid calf as adherent necrotic debris which will need debridement but it is also down in terms of dimensions. We'll use Endoform 07/19/17 on evaluation today patient appears to be doing well with his Current wound  care measures and there does not appear to be necrotic tissue which is good news. The Endoform however does seem to be causing some epithelialization although this is loose at the proximal/12 o'clock location and seems to be pulling off with dressing changes. No fevers, chills, nausea, or vomiting noted at this time. He has no significant discomfort. 08/05/17; the patient has 2 small open areas on the proximal foot and a larger wound on the right anterior leg. Change to [facility] Blue last time probably because of the hyper granulation proximally 08/26/17; the patient has 2 small open areas on the proximal foot wound which is closed. The other is covered with a thin surface eschar. There is a similar wound on the right anterior leg. We've been using Hydrofera Blue 09/09/17*; the patient went for angiogram with Dr. Trula Slade. Apparently a routine scheduled arterial duplex suggested occlusion of his stent however with the angiography the stent was widely patent. No procedure was necessary. He also apparently has had a long-standing pressure area on the left buttock which dates back to prior to Thanksgiving although he's never really said anything to anybody about this. He is been attempting to treated himself with triple antibiotic cream etc. We've been using silver collagen to the  wounds on his right leg and dorsal right foot. These are largely unchanged today 09/23/17; patient's wounds are smaller in both areas and are just about closed. We have been using silver collagen he shows me an area on his buttock that is in the gluteal fold. This would be an atypical place for a pressure sore like a herpetic lesion although he says it's been there for a long time 2/14/19the patient's wound is totally closed. The area on the dorsal foot is also close to closing. I looked again at the area on his left buttock in the gluteal fold. He tells me that this is been there for 6 months. This more looks more like a pressure ulcer to me than it did when I looked at this 2 weeks ago. I simply put topical antibiotics on this but we are going to have to change to a more formal dressing 10/13/17 on evaluation today patient appears to be doing very well in regard to his right dorsal foot wound. He has been tolerating the dressing changes without complication. Fortunately there does not appear to be any evidence of infection which is good news. He continues to have a left gluteal ulcer as well as he has been tolerating the Prisma dressing on although this is not significantly smaller it does sound like he's been putting other things on this as well including some kind of frame he in fact this order another cream as well although I'm not exactly sure what this is. It's something that he "researched on the Internet that is supposed to make My wound healed him seven days." At worst his pain is a 2/10 in regard to the left gluteal region but this is mainly with cleansing of the wound. 10/21/17; his right dorsal foot wound is just about closed. The area on the right anterior shin area is closed. He has a left gluteal ulcer which is probably a pressure ulcer. He is been using his own creams that he spot on the Internet. This wound is also better. 11/04/17; this is a patient with severe PAD who is been  revascularized by vein and vascular. He had 2 difficult wound areas one on the right anterior shin and the right anterior dorsal foot. More  recently he has developed a left gluteal ulcer which is probably a pressure ulcer although it has some atypical nature to it. His proximal right leg has remained healed. He has a small open area on the right dorsal foot with a nonviable surface 11/18/17; this is a patient with severe PAD who is been revascularized by vein and vascular. The area on the right proximal leg has healed over. He has a very small ischemic wound on the right dorsal foot which was the second wound. He also had a pressure ulcer on his buttock which is healed out as of today 12/02/17; wound on the dorsal foot which I think is largely ischemic. He is upset with me for doing a surface debridement on this last visit however he still has a nonviable surface on this and I think this is largely a blood flow issue 12/16/17; wound on the dorsal foot which I think is largely ischemic. He sees Dr. [place] Sink ham again next week. We've been using Iodoflex to this not much of a healthy surface. 12/30/17; the patient went back to see vascular surgery. He had noninvasive testing. His ABI on the right was 0.70 compared to 0.60 last time on the left 0.57 compared to 0.44. TBI's were 0.3 on the right 0.29 on the left essentially unchanged from last time. Waveforms were monophasic. He was seen by a provider. Noted nonpalpable pulses. Overall they felt that they had slightly improved bilateral ABIs and right TBI. Her disease bilaterally with all waveforms monophasic. They will follow his lower extremity arterial duplex in 6 months. They're calling the areas on his feet venous stasis ulcers. He does not clearly describe claudication 01/13/18; The patient's wound is slightly larger in width and nonviable surface. We've been using Iodoflex. He has home help changing the dressings. I have been reluctant to do  debridement on this since he blames a debridement for reopening his wound actually still think this is arterial even given recent arterial results and evaluation [see above) 01/27/18; using iodoflex. better surface. 02/10/18; using Iodoflex. Surface is some better. Changed him to silver collagen today 03/02/18 on evaluation today patient continues to show signs of slow but good progress in regard to his left lower extremity ulcer. He has been tolerating the dressing changes without complication which is good news. He does have somewhat limited/poor vascular flow but nonetheless he does seem to be healing just at a much slower place and what would be otherwise recommended and expected. I do believe the silver collagen dressing change has been of benefit. 03/22/18; slow but good progress in terms of his right anterior foot wound. These are largely ischemic wounds but he has been revascularized. He is continued with silver collagen 04/07/18 on evaluation today patient appears to be doing rather well in regard to the wound on the dorsal surface of his right foot. He's been tolerating the dressing changes without complication. Fortunately there does not appear to be the evidence of infection at this time which is great news. Overall I'm very pleased with how things seem to appear at this point. 04/21/18; dorsal right foot wound secondary to PAD. Wound is making progress by 2 mm. Surface of it is passable. He is been using silver collagen 05/05/18; we received a call from home health this week that the original proximal tibial area had reopened on the right. He now has 2 open areas including the one we've been watching for a long period on the foot. The new open area seems  to be right in the same place as the original wound previously was located 05/13/2018; wounds are both actually larger. I debrided these last week both wounds are bigger. The surface looks better I been using Iodoflex. 05/27/2018; the wounds  are larger but the surface of them looks better with Iodoflex. He has a follow-up with Dr. Trula Slade sometime in November. I tried to move these up we were not able to get an earlier appointment I will try to text him. 06/16/2018; the patient's wounds are larger but with a generally cleaner surface. He has extensive set of investigations with Dr. Trula Slade on November 5. This includes ABIs TBI's and I believe an ultrasound of the stent. 07/04/2018; the patient apparently stated he felt too dizzy to go for his arterial studies on 11/5 therefore he canceled him. He has not been able to rebook them. I suspect these are ischemic wounds and I told him this. 09/01/2018; he is not been here in almost 2 months. He has been getting his dressing changed by [facility] I believe silver collagen. They are discharging him next week due to nonimprovement of the wounds predominantly. He did not follow-up with vascular surgery he was supposed to have follow-up arterial studies noninvasive. I am not sure that they have been rebooked and I do not think he has been seen. 1/23; patient's wounds are slightly larger. I believe this is ischemic. He canceled his last follow-up appointment with Dr. Trula Slade him that included noninvasive arterial studies. He says this was because of vertigo. He has not rescheduled these. He also has lost home health because of the chronicity of his wounds 2/6; the patient's wound on his right anterior tibia actually looks quite good. Not much difference in size but the surface of the wound looks good. He has a small area on the dorsal foot debris on the surface of this which I debrided with Anasept and gauze. He has not made a follow-up with vein and vascular 2/20; the patient's wound on his right anterior tibia continues to look good although it is not coming down in size much. The area on his right dorsal foot is about the same. Tightly adherent debris. The wound surface does not look healthy  care. The patient is expressed his to sustain for mechanical debridement in the past, I therefore been avoiding this at least until he goes back for repeat vascular appointment. So far he has not done this he is mingling for ENT appointments related to vertigo 3/5; right anterior tibia continues to look satisfactory perhaps slightly smaller. The area on the right dorsal foot is smaller. We have been using Hydrofera Blue. 4/16; the patient wounds have actually done well in the hiatus since we have last seen him. He is using Hydrofera Blue ready 6/25; we have not seen this patient in over 2 months as he has been in the isolation phase for the Covid epidemic. He is using Hydrofera Blue ready. He has 2 wounds right on the right anterior mid tibia and one on the right dorsal foot. Both of these are somewhat improved today. Especially the dorsal 1 over the tibia. He has not been for follow- up arterial studies but I have got him to agree to allow [place] to set these up. I 7/16; the patient's wound proximally in the mid tibia area has closed over once again. We had this closed over before and he reopened the area on the dorsal foot appears better. He has been using Hydrofera Blue. We  do not have an appointment for the follow-up arterial studies I ordered nor do I think he is really all that interested in getting them done. 7/30; patient has been using Hydrofera Blue. The dorsal foot wound looks some better however he has had a reopening in the mid tibia area. This is been closed over the last time. The opening is very superficial. We have been trying to get him follow-up vascular studies done with vein and vascular for about a year. He refused to do these at one point because he was at work getting worked up for vertigo. He is now more ready to move forward with this. 8/20; patient is using Hydrofera Blue. He has not made an appointment for his follow-up noninvasive vascular studies and I have talked to him  about this again. Both wound areas are still open on the right mid tibia and the right dorsal foot although I think they are smaller 05/04/19-Patient is back after his vascular visit his studies revealed that he is got ABI of 0.6 on the right, 0.46 on the left, monophasic flow to both right and left anterior tibial arteries is got great pressures to the right great toe. 10/8; we still do not have a vascular surgery appointment 1 month later. I think he may require a repeat angiogram his wounds on the right anterior to mid tibia is quite a bit larger the area on the right dorsal ankle, is small. Very little open area here 11/12; patient has not been here in over a month. He still did not have a vascular surgery appointment however before he left the clinic today we managed to arrange that for 12/9. The area on his right dorsal foot is surprisingly closed over today however the area in the mid tibia is quite a bit larger than when we saw this last time. 12/3; patient has a vascular appointment next week in follow-up from his previous revascularization. The area on the right dorsal foot remains closed. He still has a fairly sizable wound on the anterior tibia but it is superficial. We have been using silver collagen 12/28. The patient saw Langston Reusing the nurse practitioner at [facility]. Noted that the most recent ABIs were in September 2020 which showed a mild decline bilaterally a 0.61 on the right with monophasic waveforms and 0.46 on the left with monophasic waveforms. It was felt that the area on his right lower leg was a venous ulcer. The area on the dorsal foot remains closed. Follow-up ABIs were ordered for 6 months. We have been ordering silver collagen 1/28; monthly follow-up. Not much change in the wound on the anterior mid tibia. We have been using silver collagen for a prolonged period of time now. He has been reviewed by vascular with regards to his known PAD. They did not  think that he needed any further interventions for now and ordered follow-up ABIs in 6 months. We did manage to heal the area on the dorsal foot and that remains closed 2/25; monthly follow-up. The wound on the right anterior mid tibia is better. We changed to Alimentum last time there is less surrounding maceration and the wound bed looks healthy. Electronic Signature(s) Signed: 10/19/2019 5:41:09 PM By: Baltazar Najjar MD Entered By: Baltazar Najjar on 10/19/2019 15:38:26 -------------------------------------------------------------------------------- Physical Exam Details Patient Name: Date of Service: [name] 10/19/2019 2:30 PM Medical Record [id number] Patient Account Number: [id number] Date of Birth/Sex: Treating RN: Oct 28, 1950 (69 y.o. [name] Primary Care Provider: Nehemiah Settle,  Deirdre Peer Other Clinician: Referring Provider: Treating Provider/Extender:Galilea Quito, [name], [name]: 188 Constitutional Patient is hypertensive.. Pulse regular and within target range for patient.Marland Kitchen Respirations regular, non-labored and within target range.. Temperature is normal and within the target range for the patient.Marland Kitchen Appears in no distress. Cardiovascular pedal pulses palpable but faintly.. Notes Wound exam; faintly palpable posterior pedal pulse. Popliteal pulses palpable. No need for tibial debridement today. The surface is cleaned with Anasept and gauze. There is no surrounding erythema no evidence of infection. Rims of epithelialization Electronic Signature(s) Signed: 10/19/2019 5:41:09 PM By: Baltazar Najjar MD Entered By: Baltazar Najjar on 10/19/2019 15:41:20 -------------------------------------------------------------------------------- Physician Orders Details Patient Name: Date of Service: [name] 10/19/2019 2:30 PM Medical Record [id number] Patient Account Number: [id number] Date of Birth/Sex: Treating RN: 14-Jan-1951 (69 y.o.  [name] Primary Care Provider: Katy Apo Other Clinician: Referring Provider: Treating Provider/Extender:Vane Yapp, [name], [name]: [phone] Verbal / Phone Orders: No Diagnosis Coding ICD-10 Coding Code Description [phone] Non-pressure chronic ulcer of right calf with necrosis of muscle E11.622 Type 2 diabetes mellitus with other skin ulcer E11.621 Type 2 diabetes mellitus with foot ulcer E11.51 Type 2 diabetes mellitus with diabetic peripheral angiopathy without gangrene Follow-up Appointments Return appointment in 3 weeks. Dressing Change Frequency Wound #5R Right,Anterior Lower Leg Other: - twice a week Skin Barriers/Peri-Wound Care Barrier cream - apply zinc oxide around the wound bed for wetness, reddness, or maceration. Moisturizing lotion - to leg Wound Cleansing Wound #5R Right,Anterior Lower Leg May shower with protection. Primary Wound Dressing Wound #5R Right,Anterior Lower Leg Polymem Secondary Dressing Wound #5R Right,Anterior Lower Leg Foam Border - or bordered gauze. Edema Control Avoid standing for long periods of time Elevate legs to the level of the heart or above for 30 minutes daily and/or when sitting, a frequency of: - throughout the day Other: - patient to purchase support stockings for protection. Off-Loading Turn and reposition every 2 hours Electronic Signature(s) Signed: 10/19/2019 5:41:09 PM By: Baltazar Najjar MD Signed: 10/19/2019 6:07:18 PM By: Shawn Stall Entered By: Shawn Stall on 10/19/2019 14:51:38 -------------------------------------------------------------------------------- Problem List Details Patient Name: Date of Service: [name] 10/19/2019 2:30 PM Medical Record [id number] Patient Account Number: [id number] Date of Birth/Sex: Treating RN: 01-17-1951 (69 y.o. [name] Primary Care Provider: Other Clinician: Katy Apo Referring Provider: Treating  Provider/Extender:Rhiannon Sassaman, [name], [name]: [phone] Active Problems ICD-10 Evaluated Encounter Code Description Active Date Today Diagnosis L97.213 Non-pressure chronic ulcer of right calf with necrosis 03/06/2016 No Yes of muscle E11.622 Type 2 diabetes mellitus with other skin ulcer 03/06/2016 No Yes E11.621 Type 2 diabetes mellitus with foot ulcer 11/19/2016 No Yes E11.51 Type 2 diabetes mellitus with diabetic peripheral 03/06/2016 No Yes angiopathy without gangrene Inactive Problems ICD-10 Code Description Active Date Inactive Date L97.512 Non-pressure chronic ulcer of other part of right foot with fat 03/03/2018 03/03/2018 layer exposed Resolved Problems ICD-10 Code Description Active Date Resolved Date L97.221 Non-pressure chronic ulcer of left calf limited to breakdown of 03/06/2016 03/06/2016 skin L97.312 Non-pressure chronic ulcer of right ankle with fat layer 07/15/2016 07/15/2016 exposed L89.322 Pressure ulcer of left buttock, stage 2 09/09/2017 09/09/2017 Electronic Signature(s) Signed: 10/19/2019 5:41:09 PM By: Baltazar Najjar MD Entered By: Baltazar Najjar on 10/19/2019 15:37:43 -------------------------------------------------------------------------------- Progress Note Details Patient Name: Date of Service: [name] 10/19/2019 2:30 PM Medical Record [id number] Patient Account Number: [id number] Date of Birth/Sex: Treating RN: 17-Jun-1951 (69 y.o. M) Elesa Hacker, Yvonne Kendall  Primary Care Provider: Katy Apo Other Clinician: Referring Provider: Treating Provider/Extender:Bambi Fehnel, [name], [name]: 188 Subjective History of Present Illness (HPI) 03/06/16; this is a patient who is a type II diabetic with a recent hemoglobin A1c of 7.6. He is referred from Dr. Allyson Sabal for aggressive wound care on a wound on his right anterior leg. He has known severe PAD and has an extensive interventional history. His left common  iliac artery was stented in December 2003 and his right superficial femoral artery in April 2008. He has significant infrapopliteal disease bilaterally and has known occluded left SFA. His ABIs from September 2016 showed a ABI of 0.48 on the right and 0.52 on the left although he does not really describe claudication. He also has known carotid artery disease, coronary artery disease and is status post CABG in 2010. He tells me he smokes 3 cigarettes a day. The patient tells me his at the wound on the right anterior leg for about 6 months. He has a smaller area on the left medial leg that has been present for one month. He's been to see his primary physician Dr. polite and Dr. Allyson Sabal I am not clear what he is been using on this. It is felt that his ulcer is an ischemic ulcer on the pretibial region. It is noted that if we are unable to heal this it was felt he would need repeat angiography and potential percutaneous revascularization. 03/13/16; the area on the left medial leg is healed over. His ischemic wound on the right anterior leg is debridement of a lot of nonviable subcutaneous tissue we've been using Santyl 03/20/16; he has no open area on the left medial leg. The wound on the right anterior leg looks ischemic. A lot of nonviable tissue. Our intake nurse reported that he had a lot of dried blood clots around the wound presumably from last week's debridement. There is no evidence of infection 04/16/16; the area on the right anterior leg is an ischemic wound. He is been back to see Dr. Allyson Sabal who states he is not a candidate for revascularization. He has a large area of occlusion from his groin down which cannot be revascularized. Since Dr. Allyson Sabal is aggressive with this type of situation I can only surmised that that's the correct decision 05/01/16 the area on the right leg is an ischemic wound. He has not a candidate for revascularization. He has not smoked in 48 hours. Has been using Santyl wound  size is not any smaller however this surface looks somewhat better 05/08/16; ischemic wound on the right anterior leg. He is not a candidate for revascularization. He continues with nonsmoking for the last week. I use Prisma last week although that probably wasn't a good decision, covered in a difficult surface eschar today requiring debridement 05/15/16; patient with ischemic wound on his right anterior leg. He is not a candidate for revascularization. Complains of a lot of pain this week/burning over the wound area making it difficult for him to sleep 05/21/16; patient states less pain this week. The wound had circumferential fibrinous slough that I removed along with some nonviable subcutaneous tissue. We used Hydrofera Blue last week 05/28/16; patient states he has not had any pain. Some minor reduction in the area. Still using Hydrofera Blue 06/04/16; no pain still using Hydrofera Blue 06/11/16 no complaints still using Hydrofera Blue 06/18/16; I changed him to Endoform last week. 06/25/16; wound still appears healthy. Minor debridement this week still using Endoform  07/02/16; the wound bed certainly appears healthier. Rim of epithelialization superiorly was there last week. It certainly has not progressed any further. Using Endoform 07/09/16 patient is here for a review of the wound on the right anterior leg. He has a history of severe and prolonged PAD followed by Dr. Allyson Sabal in the setting of type 2 diabetes. I have been using Endoform for the last 3 or 4 weeks. He has a rim of epithelialization superiorly but I have not been able to advance this. He complains of pain but there is no clear infection 07/15/2016 -- the patient was here for a nurse visit but due to the presence of a new wound I was asked to see him for an opinion 07/23/16; the patient was seen last week by Dr. Meyer Russel in my absence. He had a new wound on his right dorsal ankle. Santyl was applied to this new area and also his  original wound on the right anterior tibia. He arrives today complaining of a lot of pain 07/30/16; the patient has new wound on his right dorsal ankle from 2 weeks ago. This is as usual covered with a thick eschar. I think this is ischemic and I'm not expecting to be able to heal this. Santyl was applied for but denied by his insurance we will therefore use Medihoney. He only obtained this later today. He has left a wrap on all week with Santyl. 08/06/16 both wounds in the same situation still a gelatinous surface slough. He finds the Medihoney makes him feel pain. He could not afford this Santyl opiate 08/20/16; patient arrives today frustrated about his nonhealing wounds now o2 on his right leg. He arrived here with a wound on the right anterior leg mid aspect and is developed a second area on the right dorsal ankle. I have reviewed Dr. Hazle Coca notes and his visit from August/17. There he makes clear reference to the fact that the vessels were not revascularizable. The patient also describes fairly clear claudication at rest which makes him sleep with his legs in a dependent position. He has given up smoking as of yesterday 09/03/16; patient is going for a second opinion on his vascular status of [facility] vascular next week. We have been using Medihoney to the wounds. He is tolerating his discomfort. 09/17/16; the patient is going to have an angiogram on January 30. Miraculously I can't really see has had a recent angiogram although he has been to see Dr. Allyson Sabal who does not think here is cerebrovascular rise visible option. He does have stents in both legs that Dr. Allyson Sabal put and I think about 9 years ago. I would like to continue to use Medihoney until we see the results of his angiogram. 10/01/16; the patient had his angiogram. He has stent previously which was dilated and then a stent was placed in the right superficial femoral artery. He had failed balloon angioplasty of the right posterior  tibial artery. A further stent was placed in the superficial femoral artery. An angioplasty was also done. He was noted to have severe stenosis versus occlusion of the posterior tibial artery which is the dominant vessel across the foot. Intervention was attempted here but was unsuccessful. If the patient is unable to heal his wounds it was felt he could be brought back for repeat attempt 10/15/16- patient is here for follow-up evaluation of his right lower extremity and right dorsal foot ulcers. He was unable to make last week's appointment secondary to a death in the  family. He has been using Iodosorb/Iodoflex dressing changes per home health. He states that his primary care physician follows an A1c every 3 months, with his last A1c being 7.8 which is elevated from the previous. He states he has a scheduled A1c for next month. He also states he has a follow-up appointment with [facility] in April 10/22/16- Mr. Walberg did have an xray of the right foot, revealing no definite evidence of bone destruction to suggest osteomyelitis. This was performed at [facility]. He has been wearing his show with a rolled tongue since the last visit when he leaves the home, otherwise he wears loose house slippers around the house. He is voicing no complaints or concerns since his last visit. He admits to smoking "maybe one a week" 10/29/16- patient is here for follow-up evaluation of his right lower extremity and right dorsal foot ulcers. He states that he has completely stopped smoking but does admit to dipping tobacco. We have not found out his coverage for both [employer] and Apligraf. He continues to offload this area with his walking shoe otherwise wears a slipper at home. He brought this (and it does appear to allow for relief of pressure to the dorsal part of his foot. 11/05/16; patient is here for follow-up of his right lower extremity and right dorsal foot ulcers. He has been revascularized. He no  longer smokes. We have been using Prisma. 11/12/16- Mr. Valente arrives for follow up evaluation of his right lower extremity ulcers. We have been using Prisma. He is planned for an Apligraf application today. 11/19/16- Mr.Heigl arrives for follow-up evaluation of his right lower extremity and right dorsal foot ulcer one week after Apligraf #1 application. He states the home health came out on Monday and change the foam, Kerlix and Coban and and they did not interrupt the Adaptic touch. He has a follow-up appointment with ultrasound on Monday with vein and vascular 11/26/16- he is here for follow-up evaluation. He is s/p Apligraf #1 application 2 weeks ago. he had an appointment with vascular on Monday for arterial ultrasound. In evaluation of the stent to the right SFA which indicates biphasic waveform proximal to the stent and at the origin of the stent otherwise monophasic flow throughout the stent and at the distal aspect of the stent. An ABI done in that office on Monday was 0.73 (previously 0.47). He has biphasic flow in the right posterior tibial and monophasic flow in the dorsalis pedis. He has a follow-up with Dr. Myra Gianotti on Monday, 4/9 12/03/16- He is here for follow-up evaluation. He did see Dr. Myra Gianotti on Monday and has a scheduled aortogram with lower extremity arteriogram on the 17th. He was not compressed over the last week. His wound culture came back Oxacillin sensitive staph aureus. He was prescribed Keflex and has been taking that without any voiced complaints. He states that he started taking his Lasix, although he did not contact or have an appointment with Dr. Nehemiah Settle. He does have an appointment in May 12/10/16- he is here for follow-up evaluation. He had an intervention on 4/17 with Dr. Myra Gianotti. According to his dictation 1) the patient has a widely patent right SFA and popliteal stent 2) after the an 80% occlusion of the right PTA was treated with atherectomy and balloon angioplasty  with a residual stenosis of less than 5%. The patient is voicing significant improvement and overall feeling; states that he can now sleep in the bed without any symptoms. His completed his Keflex with  no adverse reaction. He continues to self-regulate his Lasix, he is not on any potassium supplementation. 12/17/16; Arrives today with weeping edema thru the dorsal foot wound and lower leg. He has not been using his Lasix and did not have compression on his leg. According to the patient he has 40mg  Lasix at home which he takes prn but not in weeks 12/21/16; less edema and no weeping edema. Apligraf #1 12/31/16; both wounds look somewhat better especially the area distally. Apligraf #2 01/14/17; Apligraf #3. Wounds looked improved. Patient saw main and vascular. ABIs on 01/11/17 done in their office showed a patent SFA stent status post atherectomy with angioplasty of the right PTA [12/08/16] biphasic flow on the right 0.95 monophasic flow on the left 0.45. 01/28/17; Apligraf #4. Wounds not particularly better than I remember. 02/11/17; Apligraf #5 the wounds have some improvement. The distal 1 has had some epithelialization proximal one is somewhat smaller. 02/25/17; he arrived in clinic today with a disappointing amount of surface necrotic material. This is especially true on the distal anterior foot/ankle wound. Change primary dressing the Hydrofera Blue today 03/04/17; some improvement in the proximal wound on the tibia. The distal wound on the dorsal foot/ankle has too much necrotic surface. We have been using Hydrofera Blue although there was apparently some problem with obtaining it through his home health company however the patient reports he now has it 03/11/17; wounds measure slightly smaller. Surface looks questionable although with the improvement in dimensions will likely continue with the [facility] without debridement today 03/18/17; no major change in these wounds. Questionable surface.  Debridement today. Using [employer] 03/25/17; the patient has follow-up noninvasive vascular tests with [facility] vascular later this month. The wounds himself are not measuring any different however the proximal wound bed looks better with the Iodoflex not much change with the area over the dorsal foot. 04/22/17; patient had repeat angiography on 04/20/17. He had a drug-coated balloon angioplasty of the right superficial femoral artery balloon angioplasty right common femoral artery. He had stenosis of previously placed stents within the right superficial femoral artery this was treated with drug coated balloon angioplasty. He continues to have single vessel runoff via the peroneal artery He has been using Hydrofera Blue 04/29/17; no major change this week. Wound beds appear healthy. No debridement. He apparently did not tolerate Iodoflex which I was using prior to modification. I'm going to change him to Endo form with hydrogel 05/13/17; wound beds again appear a lot better, paradoxically the wound on the dorsal foot appears to be progressing better than the proximal wound. Using Endoform with hydrogel 06/03/17; doing well. wounds smaller especially the wound on the dorsal foot. using endoform 06/17/17; two-week follow-up everything is going really nicely here. Using Endoform he only has one small open area left on the right dorsal foot/ankle. Also improved on the right anterior leg. He is retired from smoking since starting with [place] in this clinic 07/01/17; two-week follow-up the area on his right anterior foot/ankle is just about closed. The area superiorly on the lower mid calf as adherent necrotic debris which will need debridement but it is also down in terms of dimensions. We'll use Endoform 07/19/17 on evaluation today patient appears to be doing well with his Current wound care measures and there does not appear to be necrotic tissue which is good news. The Endoform however does seem to be  causing some epithelialization although this is loose at the proximal/12 o'clock location and seems to be  pulling off with dressing changes. No fevers, chills, nausea, or vomiting noted at this time. He has no significant discomfort. 08/05/17; the patient has 2 small open areas on the proximal foot and a larger wound on the right anterior leg. [facility] probably because of the hyper granulation proximally 08/26/17; the patient has 2 small open areas on the proximal foot wound which is closed. The other is covered with a thin surface eschar. There is a similar wound on the right anterior leg. We've been using Hydrofera Blue 09/09/17*; the patient went for angiogram with Dr. Myra GianottiBrabham. Apparently a routine scheduled arterial duplex suggested occlusion of his stent however with the angiography the stent was widely patent. No procedure was necessary. He also apparently has had a long-standing pressure area on the left buttock which dates back to prior to Thanksgiving although he's never really said anything to anybody about this. He is been attempting to treated himself with triple antibiotic cream etc. We've been using silver collagen to the wounds on his right leg and dorsal right foot. These are largely unchanged today 09/23/17; patient's wounds are smaller in both areas and are just about closed. We have been using silver collagen he shows me an area on his buttock that is in the gluteal fold. This would be an atypical place for a pressure sore like a herpetic lesion although he says it's been there for a long time 2/14/19the patient's wound is totally closed. The area on the dorsal foot is also close to closing. I looked again at the area on his left buttock in the gluteal fold. He tells me that this is been there for 6 months. This more looks more like a pressure ulcer to me than it did when I looked at this 2 weeks ago. I simply put topical antibiotics on this but we are  going to have to change to a more formal dressing 10/13/17 on evaluation today patient appears to be doing very well in regard to his right dorsal foot wound. He has been tolerating the dressing changes without complication. Fortunately there does not appear to be any evidence of infection which is good news. He continues to have a left gluteal ulcer as well as he has been tolerating the Prisma dressing on although this is not significantly smaller it does sound like he's been putting other things on this as well including some kind of frame he in fact this order another cream as well although I'm not exactly sure what this is. It's something that he "researched on the Internet that is supposed to make My wound healed him seven days." At worst his pain is a 2/10 in regard to the left gluteal region but this is mainly with cleansing of the wound. 10/21/17; his right dorsal foot wound is just about closed. The area on the right anterior shin area is closed. He has a left gluteal ulcer which is probably a pressure ulcer. He is been using his own creams that he spot on the Internet. This wound is also better. 11/04/17; this is a patient with severe PAD who is been revascularized by [facility]. He had 2 difficult wound areas one on the right anterior shin and the right anterior dorsal foot. More recently he has developed a left gluteal ulcer which is probably a pressure ulcer although it has some atypical nature to it. His proximal right leg has remained healed. He has a small open area on the right dorsal  foot with a nonviable surface 11/18/17; this is a patient with severe PAD who is been revascularized by [facility]. The area on the right proximal leg has healed over. He has a very small ischemic wound on the right dorsal foot which was the second wound. He also had a pressure ulcer on his buttock which is healed out as of today 12/02/17; wound on the dorsal foot which I think is largely  ischemic. He is upset with me for doing a surface debridement on this last visit however he still has a nonviable surface on this and I think this is largely a blood flow issue 12/16/17; wound on the dorsal foot which I think is largely ischemic. He sees Dr. [place] Sink ham again next week. We've been using Iodoflex to this not much of a healthy surface. 12/30/17; the patient went back to see vascular surgery. He had noninvasive testing. His ABI on the right was 0.70 compared to 0.60 last time on the left 0.57 compared to 0.44. TBI's were 0.3 on the right 0.29 on the left essentially unchanged from last time. Waveforms were monophasic. He was seen by a provider. Noted nonpalpable pulses. Overall they felt that they had slightly improved bilateral ABIs and right TBI. Her disease bilaterally with all waveforms monophasic. They will follow his lower extremity arterial duplex in 6 months. They're calling the areas on his feet venous stasis ulcers. He does not clearly describe claudication 01/13/18; The patient's wound is slightly larger in width and nonviable surface. We've been using Iodoflex. He has home help changing the dressings. I have been reluctant to do debridement on this since he blames a debridement for reopening his wound actually still think this is arterial even given recent arterial results and evaluation [see above) 01/27/18; using iodoflex. better surface. 02/10/18; using Iodoflex. Surface is some better. Changed him to silver collagen today 03/02/18 on evaluation today patient continues to show signs of slow but good progress in regard to his left lower extremity ulcer. He has been tolerating the dressing changes without complication which is good news. He does have somewhat limited/poor vascular flow but nonetheless he does seem to be healing just at a much slower place and what would be otherwise recommended and expected. I do believe the silver collagen dressing change has been of  benefit. 03/22/18; slow but good progress in terms of his right anterior foot wound. These are largely ischemic wounds but he has been revascularized. He is continued with silver collagen 04/07/18 on evaluation today patient appears to be doing rather well in regard to the wound on the dorsal surface of his right foot. He's been tolerating the dressing changes without complication. Fortunately there does not appear to be the evidence of infection at this time which is great news. Overall I'm very pleased with how things seem to appear at this point. 04/21/18; dorsal right foot wound secondary to PAD. Wound is making progress by 2 mm. Surface of it is passable. He is been using silver collagen 05/05/18; we received a call from home health this week that the original proximal tibial area had reopened on the right. He now has 2 open areas including the one we've been watching for a long period on the foot. The new open area seems to be right in the same place as the original wound previously was located 05/13/2018; wounds are both actually larger. I debrided these last week both wounds are bigger. The surface looks better I been using Iodoflex. 05/27/2018;  the wounds are larger but the surface of them looks better with Iodoflex. He has a follow-up with Dr. Myra GianottiBrabham sometime in November. I tried to move these up we were not able to get an earlier appointment I will try to text him. 06/16/2018; the patient's wounds are larger but with a generally cleaner surface. He has extensive set of investigations with Dr. Myra GianottiBrabham on November 5. This includes ABIs TBI's and I believe an ultrasound of the stent. 07/04/2018; the patient apparently stated he felt too dizzy to go for his arterial studies on 11/5 therefore he canceled him. He has not been able to rebook them. I suspect these are ischemic wounds and I told him this. 09/01/2018; he is not been here in almost 2 months. He has been getting his dressing changed by  [facility] I believe silver collagen. They are discharging him next week due to nonimprovement of the wounds predominantly. He did not follow-up with vascular surgery he was supposed to have follow-up arterial studies noninvasive. I am not sure that they have been rebooked and I do not think he has been seen. 1/23; patient's wounds are slightly larger. I believe this is ischemic. He canceled his last follow-up appointment with Dr. Myra GianottiBrabham him that included noninvasive arterial studies. He says this was because of vertigo. He has not rescheduled these. He also has lost home health because of the chronicity of his wounds 2/6; the patient's wound on his right anterior tibia actually looks quite good. Not much difference in size but the surface of the wound looks good. He has a small area on the dorsal foot debris on the surface of this which I debrided with Anasept and gauze. He has not made a follow-up with vein and vascular 2/20; the patient's wound on his right anterior tibia continues to look good although it is not coming down in size much. The area on his right dorsal foot is about the same. Tightly adherent debris. The wound surface does not look healthy care. The patient is expressed his to sustain for mechanical debridement in the past, I therefore been avoiding this at least until he goes back for repeat vascular appointment. So far he has not done this he is mingling for ENT appointments related to vertigo 3/5; right anterior tibia continues to look satisfactory perhaps slightly smaller. The area on the right dorsal foot is smaller. We have been using Hydrofera Blue. 4/16; the patient wounds have actually done well in the hiatus since we have last seen him. He is using Hydrofera Blue ready 6/25; we have not seen this patient in over 2 months as he has been in the isolation phase for the Covid epidemic. He is using Hydrofera Blue ready. He has 2 wounds right on the right anterior mid tibia  and one on the right dorsal foot. Both of these are somewhat improved today. Especially the dorsal 1 over the tibia. He has not been for follow- up arterial studies but I have got him to agree to allow us to set these up. I 7/16; the patient's wound proximally in the mid tibia area has closed over once again. We had this closed over before and he reopened the area on the dorsal foot appears better. He has been using Hydrofera Blue. We do not have an appointment for the follow-up arterial studies I ordered nor do I think he is really all that interested in getting them done. 7/30; patient has been using Hydrofera Blue. The dorsal foot wound looks  some better however he has had a reopening in the mid tibia area. This is been closed over the last time. The opening is very superficial. We have been trying to get him follow-up vascular studies done with [facility] for about a year. He refused to do these at one point because he was at work getting worked up for vertigo. He is now more ready to move forward with this. 8/20; patient is using Hydrofera Blue. He has not made an appointment for his follow-up noninvasive vascular studies and I have talked to him about this again. Both wound areas are still open on the right mid tibia and the right dorsal foot although I think they are smaller 05/04/19-Patient is back after his vascular visit his studies revealed that he is got ABI of 0.6 on the right, 0.46 on the left, monophasic flow to both right and left anterior tibial arteries is got great pressures to the right great toe. 10/8; we still do not have a vascular surgery appointment 1 month later. I think he may require a repeat angiogram his wounds on the right anterior to mid tibia is quite a bit larger the area on the right dorsal ankle, is small. Very little open area here 11/12; patient has not been here in over a month. He still did not have a vascular surgery appointment however before he  left the clinic today we managed to arrange that for 12/9. The area on his right dorsal foot is surprisingly closed over today however the area in the mid tibia is quite a bit larger than when we saw this last time. 12/3; patient has a vascular appointment next week in follow-up from his previous revascularization. The area on the right dorsal foot remains closed. He still has a fairly sizable wound on the anterior tibia but it is superficial. We have been using silver collagen 12/28. The patient saw Langston Reusing the nurse practitioner at [facility]. Noted that the most recent ABIs were in September 2020 which showed a mild decline bilaterally a 0.61 on the right with monophasic waveforms and 0.46 on the left with monophasic waveforms. It was felt that the area on his right lower leg was a venous ulcer. The area on the dorsal foot remains closed. Follow-up ABIs were ordered for 6 months. We have been ordering silver collagen 1/28; monthly follow-up. Not much change in the wound on the anterior mid tibia. We have been using silver collagen for a prolonged period of time now. He has been reviewed by vascular with regards to his known PAD. They did not think that he needed any further interventions for now and ordered follow-up ABIs in 6 months. We did manage to heal the area on the dorsal foot and that remains closed 2/25; monthly follow-up. The wound on the right anterior mid tibia is better. We changed to Alimentum last time there is less surrounding maceration and the wound bed looks healthy. Objective Constitutional Patient is hypertensive.. Pulse regular and within target range for patient.Marland Kitchen Respirations regular, non-labored and within target range.. Temperature is normal and within the target range for the patient.Marland Kitchen Appears in no distress. Vitals Time Taken: 2:30 PM, Height: 61 in, Weight: 122 lbs, BMI: 23, Temperature: 98.4 F, Pulse: 88 bpm, Respiratory Rate: 18 breaths/min,  Blood Pressure: 177/65 mmHg. Cardiovascular pedal pulses palpable but faintly.. General Notes: Wound exam; faintly palpable posterior pedal pulse. Popliteal pulses palpable. No need for tibial debridement today. The surface is cleaned with Anasept  and gauze. There is no surrounding erythema no evidence of infection. Rims of epithelialization Integumentary (Hair, Skin) Wound #5R status is Open. Original cause of wound was Gradually Appeared. The wound is located on the Right,Anterior Lower Leg. The wound measures 2cm length x 2cm width x 0.1cm depth; 3.142cm^2 area and 0.314cm^3 volume. There is Fat Layer (Subcutaneous Tissue) Exposed exposed. There is no tunneling or undermining noted. There is a medium amount of serosanguineous drainage noted. The wound margin is flat and intact. There is large (67-100%) red granulation within the wound bed. There is a small (1-33%) amount of necrotic tissue within the wound bed including Adherent Slough. Assessment Active Problems ICD-10 Non-pressure chronic ulcer of right calf with necrosis of muscle Type 2 diabetes mellitus with other skin ulcer Type 2 diabetes mellitus with foot ulcer Type 2 diabetes mellitus with diabetic peripheral angiopathy without gangrene Plan Follow-up Appointments: Return appointment in 3 weeks. Dressing Change Frequency: Wound #5R Right,Anterior Lower Leg: Other: - twice a week Skin Barriers/Peri-Wound Care: Barrier cream - apply zinc oxide around the wound bed for wetness, reddness, or maceration. Moisturizing lotion - to leg Wound Cleansing: Wound #5R Right,Anterior Lower Leg: May shower with protection. Primary Wound Dressing: Wound #5R Right,Anterior Lower Leg: Polymem Secondary Dressing: Wound #5R Right,Anterior Lower Leg: Foam Border - or bordered gauze. Edema Control: Avoid standing for long periods of time Elevate legs to the level of the heart or above for 30 minutes daily and/or when sitting, a  frequency of: - throughout the day Other: - patient to purchase support stockings for protection. Off-Loading: Turn and reposition every 2 hours 1. Continue with regular polymen change every 2 days. The wound appears to be making progress 2. The patient has PAD he went to see vascular I do not think they were interested in doing anything but following his noninvasive studies at this Electronic Signature(s) Signed: 10/19/2019 5:41:09 PM By: Baltazar Najjar MD Entered By: Baltazar Najjar on 10/19/2019 15:42:03 -------------------------------------------------------------------------------- SuperBill Details Patient Name: Date of Service: [name] 10/19/2019 Medical Record [id number] Patient Account Number: [id number] Date of Birth/Sex: Treating RN: [birth year] (69 y.o. Harlon Flor, Millard.Loa Primary Care Provider: Katy Apo Other Clinician: Referring Provider: Treating Provider/Extender:Raja Caputi, [name], [name]: 188 Diagnosis Coding ICD-10 Codes Code Description [id number] Non-pressure chronic ulcer of right calf with necrosis of muscle E11.622 Type 2 diabetes mellitus with other skin ulcer E11.621 Type 2 diabetes mellitus with foot ulcer E11.51 Type 2 diabetes mellitus with diabetic peripheral angiopathy without gangrene Facility Procedures The patient participates with Medicare or their insurance follows the Medicare Facility Guidelines: CPT4 Code Description Modifier Quantity [id number] - WOUND CARE VISIT-LEV 3 EST PT 1 Physician Procedures CPT4 Code Description: [id number] - WC PHYS LEVEL 3 - EST PT ICD-10 Diagnosis Description L97.213 Non-pressure chronic ulcer of right calf with necrosi E11.622 Type 2 diabetes mellitus with other skin ulcer Modifier: s of muscle Quantity: 1 Electronic Signature(s) Signed: 10/19/2019 5:41:09 PM By: Baltazar Najjar MD Entered By: Baltazar Najjar on 10/19/2019 15:42:22

## 2019-11-09 ENCOUNTER — Encounter (HOSPITAL_BASED_OUTPATIENT_CLINIC_OR_DEPARTMENT_OTHER): Payer: Medicare Other | Attending: Internal Medicine | Admitting: Internal Medicine

## 2019-11-09 ENCOUNTER — Other Ambulatory Visit: Payer: Self-pay

## 2019-11-09 DIAGNOSIS — L97813 Non-pressure chronic ulcer of other part of right lower leg with necrosis of muscle: Secondary | ICD-10-CM | POA: Diagnosis not present

## 2019-11-09 DIAGNOSIS — E11622 Type 2 diabetes mellitus with other skin ulcer: Secondary | ICD-10-CM | POA: Insufficient documentation

## 2019-11-09 DIAGNOSIS — L97812 Non-pressure chronic ulcer of other part of right lower leg with fat layer exposed: Secondary | ICD-10-CM | POA: Insufficient documentation

## 2019-11-09 DIAGNOSIS — E1151 Type 2 diabetes mellitus with diabetic peripheral angiopathy without gangrene: Secondary | ICD-10-CM | POA: Diagnosis not present

## 2019-11-09 NOTE — Progress Notes (Signed)
RYDELL, WIEGEL ([id number]) Visit Report for 11/09/2019 HPI Details Patient Name: Date of Service: Reginald Ayers, Reginald Ayers 11/09/2019 2:30 PM Medical Record [id number] Patient Account Number: [id number] Date of Birth/Sex: Treating RN: 04/08/1951 (69 y.o. [name] Primary Care Provider: Katy Apo Other Clinician: Referring Provider: Treating Provider/Extender:Caleigha Zale, [name], [name]: 191 History of Present Illness HPI Description: 03/06/16; this is a patient who is a type II diabetic with a recent hemoglobin A1c of 7.6. He is referred from Dr. Allyson Sabal for aggressive wound care on a wound on his right anterior leg. He has known severe PAD and has an extensive interventional history. His left common iliac artery was stented in December 2003 and his right superficial femoral artery in April 2008. He has significant infrapopliteal disease bilaterally and has known occluded left SFA. His ABIs from September 2016 showed a ABI of 0.48 on the right and 0.52 on the left although he does not really describe claudication. He also has known carotid artery disease, coronary artery disease and is status post CABG in 2010. He tells me he smokes 3 cigarettes a day. The patient tells me his at the wound on the right anterior leg for about 6 months. He has a smaller area on the left medial leg that has been present for one month. He's been to see his primary physician Dr. polite and Dr. Allyson Sabal I am not clear what he is been using on this. It is felt that his ulcer is an ischemic ulcer on the pretibial region. It is noted that if we are unable to heal this it was felt he would need repeat angiography and potential percutaneous revascularization. 03/13/16; the area on the left medial leg is healed over. His ischemic wound on the right anterior leg is debridement of a lot of nonviable subcutaneous tissue we've been using Santyl 03/20/16; he has no open area on the left medial  leg. The wound on the right anterior leg looks ischemic. A lot of nonviable tissue. Our intake nurse reported that he had a lot of dried blood clots around the wound presumably from last week's debridement. There is no evidence of infection 04/16/16; the area on the right anterior leg is an ischemic wound. He is been back to see Dr. Allyson Sabal who states he is not a candidate for revascularization. He has a large area of occlusion from his groin down which cannot be revascularized. Since Dr. Allyson Sabal is aggressive with this type of situation I can only surmised that that's the correct decision 05/01/16 the area on the right leg is an ischemic wound. He has not a candidate for revascularization. He has not smoked in 48 hours. Has been using Santyl wound size is not any smaller however this surface looks somewhat better 05/08/16; ischemic wound on the right anterior leg. He is not a candidate for revascularization. He continues with nonsmoking for the last week. I use Prisma last week although that probably wasn't a good decision, covered in a difficult surface eschar today requiring debridement 05/15/16; patient with ischemic wound on his right anterior leg. He is not a candidate for revascularization. Complains of a lot of pain this week/burning over the wound area making it difficult for him to sleep 05/21/16; patient states less pain this week. The wound had circumferential fibrinous slough that I removed along with some nonviable subcutaneous tissue. We used Hydrofera Blue last week 05/28/16; patient states he has not had any pain. Some minor reduction in the area.  Still using Hydrofera Blue 06/04/16; no pain still using Hydrofera Blue 06/11/16 no complaints still using Hydrofera Blue 06/18/16; I changed him to Endoform last week. 06/25/16; wound still appears healthy. Minor debridement this week still using Endoform 07/02/16; the wound bed certainly appears healthier. Rim of epithelialization superiorly was  there last week. It certainly has not progressed any further. Using Endoform 07/09/16 patient is here for a review of the wound on the right anterior leg. He has a history of severe and prolonged PAD followed by Dr. Allyson Sabal in the setting of type 2 diabetes. I have been using Endoform for the last 3 or 4 weeks. He has a rim of epithelialization superiorly but I have not been able to advance this. He complains of pain but there is no clear infection 07/15/2016 -- the patient was here for a nurse visit but due to the presence of a new wound I was asked to see him for an opinion 07/23/16; the patient was seen last week by Dr. Meyer Russel in my absence. He had a new wound on his right dorsal ankle. Santyl was applied to this new area and also his original wound on the right anterior tibia. He arrives today complaining of a lot of pain 07/30/16; the patient has new wound on his right dorsal ankle from 2 weeks ago. This is as usual covered with a thick eschar. I think this is ischemic and I'm not expecting to be able to heal this. Santyl was applied for but denied by his insurance we will therefore use Medihoney. He only obtained this later today. He has left a wrap on all week with Santyl. 08/06/16 both wounds in the same situation still a gelatinous surface slough. He finds the Medihoney makes him feel pain. He could not afford this Santyl opiate 08/20/16; patient arrives today frustrated about his nonhealing wounds now 2 on his right leg. He arrived here with a wound on the right anterior leg mid aspect and is developed a second area on the right dorsal ankle. I have reviewed Dr. Hazle Coca notes and his visit from August/17. There he makes clear reference to the fact that the vessels were not revascularizable. The patient also describes fairly clear claudication at rest which makes him sleep with his legs in a dependent position. He has given up smoking as of yesterday 09/03/16; patient is going for a second  opinion on his vascular status of vein and vascular next week. We have been using Medihoney to the wounds. He is tolerating his discomfort. 09/17/16; the patient is going to have an angiogram on January 30. Miraculously I can't really see has had a recent angiogram although he has been to see Dr. Allyson Sabal who does not think here is cerebrovascular rise visible option. He does have stents in both legs that Dr. Allyson Sabal put and I think about 9 years ago. I would like to continue to use Medihoney until we see the results of his angiogram. 10/01/16; the patient had his angiogram. He has stent previously which was dilated and then a stent was placed in the right superficial femoral artery. He had failed balloon angioplasty of the right posterior tibial artery. A further stent was placed in the superficial femoral artery. An angioplasty was also done. He was noted to have severe stenosis versus occlusion of the posterior tibial artery which is the dominant vessel across the foot. Intervention was attempted here but was unsuccessful. If the patient is unable to heal his wounds it was felt he  could be brought back for repeat attempt 10/15/16- patient is here for follow-up evaluation of his right lower extremity and right dorsal foot ulcers. He was unable to make last week's appointment secondary to a death in the family. He has been using Iodosorb/Iodoflex dressing changes per home health. He states that his primary care physician follows an A1c every 3 months, with his last A1c being 7.8 which is elevated from the previous. He states he has a scheduled A1c for next month. He also states he has a follow-up appointment with Vein and Vascular in April 10/22/16- Mr. Hymes did have an xray of the right foot, revealing no definite evidence of bone destruction to suggest osteomyelitis. This was performed at [facility]. He has been wearing his show with a rolled tongue since the last visit when he leaves the home,  otherwise he wears loose house slippers around the house. He is voicing no complaints or concerns since his last visit. He admits to smoking "maybe one a week" 10/29/16- patient is here for follow-up evaluation of his right lower extremity and right dorsal foot ulcers. He states that he has completely stopped smoking but does admit to dipping tobacco. We have not found out his coverage for both [employer] and Apligraf. He continues to offload this area with his walking shoe otherwise wears a slipper at home. He brought this (and it does appear to allow for relief of pressure to the dorsal part of his foot. 11/05/16; patient is here for follow-up of his right lower extremity and right dorsal foot ulcers. He has been revascularized. He no longer smokes. We have been using Prisma. 11/12/16- Mr. Fulco arrives for follow up evaluation of his right lower extremity ulcers. We have been using Prisma. He is planned for an Apligraf application today. 11/19/16- Mr.Klug arrives for follow-up evaluation of his right lower extremity and right dorsal foot ulcer one week after Apligraf #1 application. He states the home health came out on Monday and change the foam, Kerlix and Coban and and they did not interrupt the Adaptic touch. He has a follow-up appointment with ultrasound on Monday with vein and vascular 11/26/16- he is here for follow-up evaluation. He is s/p Apligraf #1 application 2 weeks ago. he had an appointment with vascular on Monday for arterial ultrasound. In evaluation of the stent to the right SFA which indicates biphasic waveform proximal to the stent and at the origin of the stent otherwise monophasic flow throughout the stent and at the distal aspect of the stent. An ABI done in that office on Monday was 0.73 (previously 0.47). He has biphasic flow in the right posterior tibial and monophasic flow in the dorsalis pedis. He has a follow-up with Dr. Myra Gianotti on Monday, 4/9 12/03/16- He is here for  follow-up evaluation. He did see Dr. Myra Gianotti on Monday and has a scheduled aortogram with lower extremity arteriogram on the 17th. He was not compressed over the last week. His wound culture came back Oxacillin sensitive staph aureus. He was prescribed Keflex and has been taking that without any voiced complaints. He states that he started taking his Lasix, although he did not contact or have an appointment with Dr. Nehemiah Settle. He does have an appointment in May 12/10/16- he is here for follow-up evaluation. He had an intervention on 4/17 with Dr. Myra Gianotti. According to his dictation 1) the patient has a widely patent right SFA and popliteal stent 2) after the an 80% occlusion of the right PTA was treated with  atherectomy and balloon angioplasty with a residual stenosis of less than 5%. The patient is voicing significant improvement and overall feeling; states that he can now sleep in the bed without any symptoms. His completed his Keflex with no adverse reaction. He continues to self-regulate his Lasix, he is not on any potassium supplementation. 12/17/16; Arrives today with weeping edema thru the dorsal foot wound and lower leg. He has not been using his Lasix and did not have compression on his leg. According to the patient he has 40mg  Lasix at home which he takes prn but not in weeks 12/21/16; less edema and no weeping edema. Apligraf #1 12/31/16; both wounds look somewhat better especially the area distally. Apligraf #2 01/14/17; Apligraf #3. Wounds looked improved. Patient saw main and vascular. ABIs on 01/11/17 done in their office showed a patent SFA stent status post atherectomy with angioplasty of the right PTA [12/08/16] biphasic flow on the right 0.95 monophasic flow on the left 0.45. 01/28/17; Apligraf #4. Wounds not particularly better than I remember. 02/11/17; Apligraf #5 the wounds have some improvement. The distal 1 has had some epithelialization proximal one is somewhat smaller. 02/25/17; he  arrived in clinic today with a disappointing amount of surface necrotic material. This is especially true on the distal anterior foot/ankle wound. Change primary dressing the Hydrofera Blue today 03/04/17; some improvement in the proximal wound on the tibia. The distal wound on the dorsal foot/ankle has too much necrotic surface. We have been using Hydrofera Blue although there was apparently some problem with obtaining it through his home health company however the patient reports he now has it 03/11/17; wounds measure slightly smaller. Surface looks questionable although with the improvement in dimensions will likely continue with the [facility] without debridement today 03/18/17; no major change in these wounds. Questionable surface. Debridement today. Using [facility] 03/25/17; the patient has follow-up noninvasive vascular tests with vein and vascular later this month. The wounds himself are not measuring any different however the proximal wound bed looks better with the Iodoflex not much change with the area over the dorsal foot. 04/22/17; patient had repeat angiography on 04/20/17. He had a drug-coated balloon angioplasty of the right superficial femoral artery balloon angioplasty right common femoral artery. He had stenosis of previously placed stents within the right superficial femoral artery this was treated with drug coated balloon angioplasty. He continues to have single vessel runoff via the peroneal artery He has been using Hydrofera Blue 04/29/17; no major change this week. Wound beds appear healthy. No debridement. He apparently did not tolerate Iodoflex which I was using prior to modification. I'm going to change him to Endo form with hydrogel 05/13/17; wound beds again appear a lot better, paradoxically the wound on the dorsal foot appears to be progressing better than the proximal wound. Using Endoform with hydrogel 06/03/17; doing well. wounds smaller especially the wound on  the dorsal foot. using endoform 06/17/17; two-week follow-up everything is going really nicely here. Using Endoform he only has one small open area left on the right dorsal foot/ankle. Also improved on the right anterior leg. He is retired from smoking since starting with 06/19/17 in this clinic 07/01/17; two-week follow-up the area on his right anterior foot/ankle is just about closed. The area superiorly on the lower mid calf as adherent necrotic debris which will need debridement but it is also down in terms of dimensions. We'll use Endoform 07/19/17 on evaluation today patient appears to be doing well with his Current wound  care measures and there does not appear to be necrotic tissue which is good news. The Endoform however does seem to be causing some epithelialization although this is loose at the proximal/12 o'clock location and seems to be pulling off with dressing changes. No fevers, chills, nausea, or vomiting noted at this time. He has no significant discomfort. 08/05/17; the patient has 2 small open areas on the proximal foot and a larger wound on the right anterior leg. Change to [facility] Blue last time probably because of the hyper granulation proximally 08/26/17; the patient has 2 small open areas on the proximal foot wound which is closed. The other is covered with a thin surface eschar. There is a similar wound on the right anterior leg. We've been using Hydrofera Blue 09/09/17*; the patient went for angiogram with Dr. Trula Slade. Apparently a routine scheduled arterial duplex suggested occlusion of his stent however with the angiography the stent was widely patent. No procedure was necessary. He also apparently has had a long-standing pressure area on the left buttock which dates back to prior to Thanksgiving although he's never really said anything to anybody about this. He is been attempting to treated himself with triple antibiotic cream etc. We've been using silver collagen to the  wounds on his right leg and dorsal right foot. These are largely unchanged today 09/23/17; patient's wounds are smaller in both areas and are just about closed. We have been using silver collagen he shows me an area on his buttock that is in the gluteal fold. This would be an atypical place for a pressure sore like a herpetic lesion although he says it's been there for a long time 2/14/19the patient's wound is totally closed. The area on the dorsal foot is also close to closing. I looked again at the area on his left buttock in the gluteal fold. He tells me that this is been there for 6 months. This more looks more like a pressure ulcer to me than it did when I looked at this 2 weeks ago. I simply put topical antibiotics on this but we are going to have to change to a more formal dressing 10/13/17 on evaluation today patient appears to be doing very well in regard to his right dorsal foot wound. He has been tolerating the dressing changes without complication. Fortunately there does not appear to be any evidence of infection which is good news. He continues to have a left gluteal ulcer as well as he has been tolerating the Prisma dressing on although this is not significantly smaller it does sound like he's been putting other things on this as well including some kind of frame he in fact this order another cream as well although I'm not exactly sure what this is. It's something that he "researched on the Internet that is supposed to make My wound healed him seven days." At worst his pain is a 2/10 in regard to the left gluteal region but this is mainly with cleansing of the wound. 10/21/17; his right dorsal foot wound is just about closed. The area on the right anterior shin area is closed. He has a left gluteal ulcer which is probably a pressure ulcer. He is been using his own creams that he spot on the Internet. This wound is also better. 11/04/17; this is a patient with severe PAD who is been  revascularized by vein and vascular. He had 2 difficult wound areas one on the right anterior shin and the right anterior dorsal foot. More  recently he has developed a left gluteal ulcer which is probably a pressure ulcer although it has some atypical nature to it. His proximal right leg has remained healed. He has a small open area on the right dorsal foot with a nonviable surface 11/18/17; this is a patient with severe PAD who is been revascularized by vein and vascular. The area on the right proximal leg has healed over. He has a very small ischemic wound on the right dorsal foot which was the second wound. He also had a pressure ulcer on his buttock which is healed out as of today 12/02/17; wound on the dorsal foot which I think is largely ischemic. He is upset with me for doing a surface debridement on this last visit however he still has a nonviable surface on this and I think this is largely a blood flow issue 12/16/17; wound on the dorsal foot which I think is largely ischemic. He sees Dr. [place] Sink ham again next week. We've been using Iodoflex to this not much of a healthy surface. 12/30/17; the patient went back to see vascular surgery. He had noninvasive testing. His ABI on the right was 0.70 compared to 0.60 last time on the left 0.57 compared to 0.44. TBI's were 0.3 on the right 0.29 on the left essentially unchanged from last time. Waveforms were monophasic. He was seen by a provider. Noted nonpalpable pulses. Overall they felt that they had slightly improved bilateral ABIs and right TBI. Her disease bilaterally with all waveforms monophasic. They will follow his lower extremity arterial duplex in 6 months. They're calling the areas on his feet venous stasis ulcers. He does not clearly describe claudication 01/13/18; The patient's wound is slightly larger in width and nonviable surface. We've been using Iodoflex. He has home help changing the dressings. I have been reluctant to do  debridement on this since he blames a debridement for reopening his wound actually still think this is arterial even given recent arterial results and evaluation [see above) 01/27/18; using iodoflex. better surface. 02/10/18; using Iodoflex. Surface is some better. Changed him to silver collagen today 03/02/18 on evaluation today patient continues to show signs of slow but good progress in regard to his left lower extremity ulcer. He has been tolerating the dressing changes without complication which is good news. He does have somewhat limited/poor vascular flow but nonetheless he does seem to be healing just at a much slower place and what would be otherwise recommended and expected. I do believe the silver collagen dressing change has been of benefit. 03/22/18; slow but good progress in terms of his right anterior foot wound. These are largely ischemic wounds but he has been revascularized. He is continued with silver collagen 04/07/18 on evaluation today patient appears to be doing rather well in regard to the wound on the dorsal surface of his right foot. He's been tolerating the dressing changes without complication. Fortunately there does not appear to be the evidence of infection at this time which is great news. Overall I'm very pleased with how things seem to appear at this point. 04/21/18; dorsal right foot wound secondary to PAD. Wound is making progress by 2 mm. Surface of it is passable. He is been using silver collagen 05/05/18; we received a call from home health this week that the original proximal tibial area had reopened on the right. He now has 2 open areas including the one we've been watching for a long period on the foot. The new open area seems  to be right in the same place as the original wound previously was located 05/13/2018; wounds are both actually larger. I debrided these last week both wounds are bigger. The surface looks better I been using Iodoflex. 05/27/2018; the wounds  are larger but the surface of them looks better with Iodoflex. He has a follow-up with Dr. Trula Slade sometime in November. I tried to move these up we were not able to get an earlier appointment I will try to text him. 06/16/2018; the patient's wounds are larger but with a generally cleaner surface. He has extensive set of investigations with Dr. Trula Slade on November 5. This includes ABIs TBI's and I believe an ultrasound of the stent. 07/04/2018; the patient apparently stated he felt too dizzy to go for his arterial studies on 11/5 therefore he canceled him. He has not been able to rebook them. I suspect these are ischemic wounds and I told him this. 09/01/2018; he is not been here in almost 2 months. He has been getting his dressing changed by [facility] I believe silver collagen. They are discharging him next week due to nonimprovement of the wounds predominantly. He did not follow-up with vascular surgery he was supposed to have follow-up arterial studies noninvasive. I am not sure that they have been rebooked and I do not think he has been seen. 1/23; patient's wounds are slightly larger. I believe this is ischemic. He canceled his last follow-up appointment with Dr. Trula Slade him that included noninvasive arterial studies. He says this was because of vertigo. He has not rescheduled these. He also has lost home health because of the chronicity of his wounds 2/6; the patient's wound on his right anterior tibia actually looks quite good. Not much difference in size but the surface of the wound looks good. He has a small area on the dorsal foot debris on the surface of this which I debrided with Anasept and gauze. He has not made a follow-up with vein and vascular 2/20; the patient's wound on his right anterior tibia continues to look good although it is not coming down in size much. The area on his right dorsal foot is about the same. Tightly adherent debris. The wound surface does not look healthy  care. The patient is expressed his to sustain for mechanical debridement in the past, I therefore been avoiding this at least until he goes back for repeat vascular appointment. So far he has not done this he is mingling for ENT appointments related to vertigo 3/5; right anterior tibia continues to look satisfactory perhaps slightly smaller. The area on the right dorsal foot is smaller. We have been using Hydrofera Blue. 4/16; the patient wounds have actually done well in the hiatus since we have last seen him. He is using Hydrofera Blue ready 6/25; we have not seen this patient in over 2 months as he has been in the isolation phase for the Covid epidemic. He is using Hydrofera Blue ready. He has 2 wounds right on the right anterior mid tibia and one on the right dorsal foot. Both of these are somewhat improved today. Especially the dorsal 1 over the tibia. He has not been for follow- up arterial studies but I have got him to agree to allow [place] to set these up. I 7/16; the patient's wound proximally in the mid tibia area has closed over once again. We had this closed over before and he reopened the area on the dorsal foot appears better. He has been using Hydrofera Blue. We  do not have an appointment for the follow-up arterial studies I ordered nor do I think he is really all that interested in getting them done. 7/30; patient has been using Hydrofera Blue. The dorsal foot wound looks some better however he has had a reopening in the mid tibia area. This is been closed over the last time. The opening is very superficial. We have been trying to get him follow-up vascular studies done with vein and vascular for about a year. He refused to do these at one point because he was at work getting worked up for vertigo. He is now more ready to move forward with this. 8/20; patient is using Hydrofera Blue. He has not made an appointment for his follow-up noninvasive vascular studies and I have talked to him  about this again. Both wound areas are still open on the right mid tibia and the right dorsal foot although I think they are smaller 05/04/19-Patient is back after his vascular visit his studies revealed that he is got ABI of 0.6 on the right, 0.46 on the left, monophasic flow to both right and left anterior tibial arteries is got great pressures to the right great toe. 10/8; we still do not have a vascular surgery appointment 1 month later. I think he may require a repeat angiogram his wounds on the right anterior to mid tibia is quite a bit larger the area on the right dorsal ankle, is small. Very little open area here 11/12; patient has not been here in over a month. He still did not have a vascular surgery appointment however before he left the clinic today we managed to arrange that for 12/9. The area on his right dorsal foot is surprisingly closed over today however the area in the mid tibia is quite a bit larger than when we saw this last time. 12/3; patient has a vascular appointment next week in follow-up from his previous revascularization. The area on the right dorsal foot remains closed. He still has a fairly sizable wound on the anterior tibia but it is superficial. We have been using silver collagen 12/28. The patient saw Manus Gunning the nurse practitioner at [facility] and vascular. Noted that the most recent ABIs were in September 2020 which showed a mild decline bilaterally a 0.61 on the right with monophasic waveforms and 0.46 on the left with monophasic waveforms. It was felt that the area on his right lower leg was a venous ulcer. The area on the dorsal foot remains closed. Follow-up ABIs were ordered for 6 months. We have been ordering silver collagen 1/28; monthly follow-up. Not much change in the wound on the anterior mid tibia. We have been using silver collagen for a prolonged period of time now. He has been reviewed by vascular with regards to his known PAD. They did not  think that he needed any further interventions for now and ordered follow-up ABIs in 6 months. We did manage to heal the area on the dorsal foot and that remains closed 2/25; monthly follow-up. The wound on the right anterior mid tibia is better. We changed to polymen last time there is less surrounding maceration and the wound bed looks healthy. 3/18; monthly follow-up. The wound on the right anterior mid tibia is better smaller we've been using PolyMem Electronic Signature(s) Signed: 11/09/2019 7:02:26 PM By: Linton Ham MD Entered By: Linton Ham on 11/09/2019 18:52:51 -------------------------------------------------------------------------------- Physical Exam Details Patient Name: Date of Service: Reginald Ayers 11/09/2019 2:30 PM Medical Record [id number] Patient  Account Number: [id number] Date of Birth/Sex: Treating RN: 07-21-1951 (69 y.o. [name]) Deaton, Bobbi Primary Care Provider: Katy ApoPolite, Ronald D Other Clinician: Referring Provider: Treating Provider/Extender:Antwanette Wesche, Almira CoasterMichael Polite, Chalmers Guestonald D Weeks in Treatment: 191 Constitutional Sitting or standing Blood Pressure is within target range for patient.. Pulse regular and within target range for patient.Marland Kitchen. Respirations regular, non-labored and within target range.. Temperature is normal and within the target range for the patient.Marland Kitchen. Appears in no distress. Notes wound exam; very faint pedal pulses. The remaining wound is on the right anterior mid tibia. Washed off with antiseptic gauze this cleans up nicely very small. This open area is nondescript no mechanical debridement is required there is no evidence of surrounding infection. The other wound was the distal wound on the dorsal foot this is remaining closed for some time now Electronic Signature(s) Signed: 11/09/2019 7:02:26 PM By: Baltazar Najjarobson, Koa Zoeller MD Entered By: Baltazar Najjarobson, Aryan Bello on 11/09/2019  18:54:48 -------------------------------------------------------------------------------- Physician Orders Details Patient Name: Date of Service: Reginald GalaUSS, Taelyn H. 11/09/2019 2:30 PM Medical Record [id number] Patient Account Number: [id number] Date of Birth/Sex: Treating RN: 07-21-1951 (69 y.o. [name]) Deaton, Bobbi Primary Care Provider: Katy ApoPolite, Ronald D Other Clinician: Referring Provider: Treating Provider/Extender:Maryhelen Lindler, Almira CoasterMichael Polite, Chalmers Guestonald D Weeks in Treatment: [phone] Verbal / Phone Orders: No Diagnosis Coding ICD-10 Coding Code Description [phone] Non-pressure chronic ulcer of right calf with necrosis of muscle E11.622 Type 2 diabetes mellitus with other skin ulcer E11.621 Type 2 diabetes mellitus with foot ulcer E11.51 Type 2 diabetes mellitus with diabetic peripheral angiopathy without gangrene Follow-up Appointments Return appointment in 3 weeks. Dressing Change Frequency Wound #5R Right,Anterior Lower Leg Other: - twice a week Skin Barriers/Peri-Wound Care Barrier cream - apply zinc oxide around the wound bed for wetness, reddness, or maceration. Moisturizing lotion - to leg Wound Cleansing Wound #5R Right,Anterior Lower Leg May shower with protection. Primary Wound Dressing Wound #5R Right,Anterior Lower Leg Polymem Secondary Dressing Wound #5R Right,Anterior Lower Leg Foam Border - or bordered gauze. Edema Control Avoid standing for long periods of time Elevate legs to the level of the heart or above for 30 minutes daily and/or when sitting, a frequency of: - throughout the day Other: - patient to purchase support stockings for protection. Off-Loading Turn and reposition every 2 hours Electronic Signature(s) Signed: 11/09/2019 4:55:35 PM By: Shawn Stalleaton, Bobbi Signed: 11/09/2019 7:02:26 PM By: Baltazar Najjarobson, Morgaine Kimball MD Entered By: Shawn Stalleaton, Bobbi on 11/09/2019 15:16:46 -------------------------------------------------------------------------------- Problem List  Details Patient Name: Date of Service: Reginald GalaUSS, Reginald H. 11/09/2019 2:30 PM Medical Record [id number] Patient Account Number: [id number] Date of Birth/Sex: Treating RN: 07-21-1951 (69 y.o. 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 03/06/2016 No Yes of muscle E11.622 Type 2 diabetes mellitus with other skin ulcer 03/06/2016 No Yes E11.621 Type 2 diabetes mellitus with foot ulcer 11/19/2016 No Yes E11.51 Type 2 diabetes mellitus with diabetic peripheral 03/06/2016 No Yes angiopathy without gangrene Inactive Problems ICD-10 Code Description Active Date Inactive Date L97.512 Non-pressure chronic ulcer of other part of right foot with fat 03/03/2018 03/03/2018 layer exposed Resolved Problems ICD-10 Code Description Active Date Resolved Date L97.221 Non-pressure chronic ulcer of left calf limited to breakdown of 03/06/2016 03/06/2016 skin L97.312 Non-pressure chronic ulcer of right ankle with fat layer 07/15/2016 07/15/2016 exposed L89.322 Pressure ulcer of left buttock, stage 2 09/09/2017 09/09/2017 Electronic Signature(s) Signed:  11/09/2019 7:02:26 PM By: Baltazar Najjar MD Previous Signature: 11/09/2019 4:55:35 PM Version By: Shawn Stall Entered By: Baltazar Najjar on 11/09/2019 18:51:41 -------------------------------------------------------------------------------- Progress Note Details Patient Name: Date of Service: Reginald Ayers 11/09/2019 2:30 PM Medical Record [id number] Patient Account Number: [id number] Date of Birth/Sex: Treating RN: [birth year] (69 y.o. [name] Primary Care Provider: Katy Apo Other Clinician: Referring Provider: Treating Provider/Extender:Avalynn Bowe,  [name], [name]: 191 Subjective History of Present Illness (HPI) 03/06/16; this is a patient who is a type II diabetic with a recent hemoglobin A1c of 7.6. He is referred from Dr. Allyson Sabal for aggressive wound care on a wound on his right anterior leg. He has known severe PAD and has an extensive interventional history. His left common iliac artery was stented in December 2003 and his right superficial femoral artery in April 2008. He has significant infrapopliteal disease bilaterally and has known occluded left SFA. His ABIs from September 2016 showed a ABI of 0.48 on the right and 0.52 on the left although he does not really describe claudication. He also has known carotid artery disease, coronary artery disease and is status post CABG in 2010. He tells me he smokes 3 cigarettes a day. The patient tells me his at the wound on the right anterior leg for about 6 months. He has a smaller area on the left medial leg that has been present for one month. He's been to see his primary physician Dr. polite and Dr. Allyson Sabal I am not clear what he is been using on this. It is felt that his ulcer is an ischemic ulcer on the pretibial region. It is noted that if we are unable to heal this it was felt he would need repeat angiography and potential percutaneous revascularization. 03/13/16; the area on the left medial leg is healed over. His ischemic wound on the right anterior leg is debridement of a lot of nonviable subcutaneous tissue we've been using Santyl 03/20/16; he has no open area on the left medial leg. The wound on the right anterior leg looks ischemic. A lot of nonviable tissue. Our intake nurse reported that he had a lot of dried blood clots around the wound presumably from last week's debridement. There is no evidence of infection 04/16/16; the area on the right anterior leg is an ischemic wound. He is been back to see Dr. Allyson Sabal who states he is not a candidate for  revascularization. He has a large area of occlusion from his groin down which cannot be revascularized. Since Dr. Allyson Sabal is aggressive with this type of situation I can only surmised that that's the correct decision 05/01/16 the area on the right leg is an ischemic wound. He has not a candidate for revascularization. He has not smoked in 48 hours. Has been using Santyl wound size is not any smaller however this surface looks somewhat better 05/08/16; ischemic wound on the right anterior leg. He is not a candidate for revascularization. He continues with nonsmoking for the last week. I use Prisma last week although that probably wasn't a good decision, covered in a difficult surface eschar today requiring debridement 05/15/16; patient with ischemic wound on his right anterior leg. He is not a candidate for revascularization. Complains of a lot of pain this week/burning over the wound area making it difficult for him to sleep 05/21/16; patient states less pain this week. The wound had circumferential fibrinous slough that I removed along with some nonviable subcutaneous tissue. We used Hydrofera  Blue last week 05/28/16; patient states he has not had any pain. Some minor reduction in the area. Still using Hydrofera Blue 06/04/16; no pain still using Hydrofera Blue 06/11/16 no complaints still using Hydrofera Blue 06/18/16; I changed him to Endoform last week. 06/25/16; wound still appears healthy. Minor debridement this week still using Endoform 07/02/16; the wound bed certainly appears healthier. Rim of epithelialization superiorly was there last week. It certainly has not progressed any further. Using Endoform 07/09/16 patient is here for a review of the wound on the right anterior leg. He has a history of severe and prolonged PAD followed by Dr. Allyson Sabal in the setting of type 2 diabetes. I have been using Endoform for the last 3 or 4 weeks. He has a rim of epithelialization superiorly but I have not been  able to advance this. He complains of pain but there is no clear infection 07/15/2016 -- the patient was here for a nurse visit but due to the presence of a new wound I was asked to see him for an opinion 07/23/16; the patient was seen last week by Dr. Meyer Russel in my absence. He had a new wound on his right dorsal ankle. Santyl was applied to this new area and also his original wound on the right anterior tibia. He arrives today complaining of a lot of pain 07/30/16; the patient has new wound on his right dorsal ankle from 2 weeks ago. This is as usual covered with a thick eschar. I think this is ischemic and I'm not expecting to be able to heal this. Santyl was applied for but denied by his insurance we will therefore use Medihoney. He only obtained this later today. He has left a wrap on all week with Santyl. 08/06/16 both wounds in the same situation still a gelatinous surface slough. He finds the Medihoney makes him feel pain. He could not afford this Santyl opiate 08/20/16; patient arrives today frustrated about his nonhealing wounds now o2 on his right leg. He arrived here with a wound on the right anterior leg mid aspect and is developed a second area on the right dorsal ankle. I have reviewed Dr. Hazle Coca notes and his visit from August/17. There he makes clear reference to the fact that the vessels were not revascularizable. The patient also describes fairly clear claudication at rest which makes him sleep with his legs in a dependent position. He has given up smoking as of yesterday 09/03/16; patient is going for a second opinion on his vascular status of vein and vascular next week. We have been using Medihoney to the wounds. He is tolerating his discomfort. 09/17/16; the patient is going to have an angiogram on January 30. Miraculously I can't really see has had a recent angiogram although he has been to see Dr. Allyson Sabal who does not think here is cerebrovascular rise visible option.  He does have stents in both legs that Dr. Allyson Sabal put and I think about 9 years ago. I would like to continue to use Medihoney until we see the results of his angiogram. 10/01/16; the patient had his angiogram. He has stent previously which was dilated and then a stent was placed in the right superficial femoral artery. He had failed balloon angioplasty of the right posterior tibial artery. A further stent was placed in the superficial femoral artery. An angioplasty was also done. He was noted to have severe stenosis versus occlusion of the posterior tibial artery which is the dominant vessel across the foot. Intervention was  attempted here but was unsuccessful. If the patient is unable to heal his wounds it was felt he could be brought back for repeat attempt 10/15/16- patient is here for follow-up evaluation of his right lower extremity and right dorsal foot ulcers. He was unable to make last week's appointment secondary to a death in the family. He has been using Iodosorb/Iodoflex dressing changes per home health. He states that his primary care physician follows an A1c every 3 months, with his last A1c being 7.8 which is elevated from the previous. He states he has a scheduled A1c for next month. He also states he has a follow-up appointment with [facility] in April 10/22/16- Mr. Swiney did have an xray of the right foot, revealing no definite evidence of bone destruction to suggest osteomyelitis. This was performed at [facility]. He has been wearing his show with a rolled tongue since the last visit when he leaves the home, otherwise he wears loose house slippers around the house. He is voicing no complaints or concerns since his last visit. He admits to smoking "maybe one a week" 10/29/16- patient is here for follow-up evaluation of his right lower extremity and right dorsal foot ulcers. He states that he has completely stopped smoking but does admit to dipping tobacco. We have not found  out his coverage for both [employer] and Apligraf. He continues to offload this area with his walking shoe otherwise wears a slipper at home. He brought this (and it does appear to allow for relief of pressure to the dorsal part of his foot. 11/05/16; patient is here for follow-up of his right lower extremity and right dorsal foot ulcers. He has been revascularized. He no longer smokes. We have been using Prisma. 11/12/16- Mr. Geffre arrives for follow up evaluation of his right lower extremity ulcers. We have been using Prisma. He is planned for an Apligraf application today. 11/19/16- Mr.Crespin arrives for follow-up evaluation of his right lower extremity and right dorsal foot ulcer one week after Apligraf #1 application. He states the home health came out on Monday and change the foam, Kerlix and Coban and and they did not interrupt the Adaptic touch. He has a follow-up appointment with ultrasound on Monday with vein and vascular 11/26/16- he is here for follow-up evaluation. He is s/p Apligraf #1 application 2 weeks ago. he had an appointment with vascular on Monday for arterial ultrasound. In evaluation of the stent to the right SFA which indicates biphasic waveform proximal to the stent and at the origin of the stent otherwise monophasic flow throughout the stent and at the distal aspect of the stent. An ABI done in that office on Monday was 0.73 (previously 0.47). He has biphasic flow in the right posterior tibial and monophasic flow in the dorsalis pedis. He has a follow-up with Dr. Myra Gianotti on Monday, 4/9 12/03/16- He is here for follow-up evaluation. He did see Dr. Myra Gianotti on Monday and has a scheduled aortogram with lower extremity arteriogram on the 17th. He was not compressed over the last week. His wound culture came back Oxacillin sensitive staph aureus. He was prescribed Keflex and has been taking that without any voiced complaints. He states that he started taking his Lasix, although he did  not contact or have an appointment with Dr. Nehemiah Settle. He does have an appointment in May 12/10/16- he is here for follow-up evaluation. He had an intervention on 4/17 with Dr. Myra Gianotti. According to his dictation 1) the patient has a widely patent  right SFA and popliteal stent 2) after the an 80% occlusion of the right PTA was treated with atherectomy and balloon angioplasty with a residual stenosis of less than 5%. The patient is voicing significant improvement and overall feeling; states that he can now sleep in the bed without any symptoms. His completed his Keflex with no adverse reaction. He continues to self-regulate his Lasix, he is not on any potassium supplementation. 12/17/16; Arrives today with weeping edema thru the dorsal foot wound and lower leg. He has not been using his Lasix and did not have compression on his leg. According to the patient he has 40mg  Lasix at home which he takes prn but not in weeks 12/21/16; less edema and no weeping edema. Apligraf #1 12/31/16; both wounds look somewhat better especially the area distally. Apligraf #2 01/14/17; Apligraf #3. Wounds looked improved. Patient saw main and vascular. ABIs on 01/11/17 done in their office showed a patent SFA stent status post atherectomy with angioplasty of the right PTA [12/08/16] biphasic flow on the right 0.95 monophasic flow on the left 0.45. 01/28/17; Apligraf #4. Wounds not particularly better than I remember. 02/11/17; Apligraf #5 the wounds have some improvement. The distal 1 has had some epithelialization proximal one is somewhat smaller. 02/25/17; he arrived in clinic today with a disappointing amount of surface necrotic material. This is especially true on the distal anterior foot/ankle wound. Change primary dressing the Hydrofera Blue today 03/04/17; some improvement in the proximal wound on the tibia. The distal wound on the dorsal foot/ankle has too much necrotic surface. We have been using Hydrofera Blue although  there was apparently some problem with obtaining it through his home health company however the patient reports he now has it 03/11/17; wounds measure slightly smaller. Surface looks questionable although with the improvement in dimensions will likely continue with the [facility] without debridement today 03/18/17; no major change in these wounds. Questionable surface. Debridement today. Using [facility] 03/25/17; the patient has follow-up noninvasive vascular tests with vein and vascular later this month. The wounds himself are not measuring any different however the proximal wound bed looks better with the Iodoflex not much change with the area over the dorsal foot. 04/22/17; patient had repeat angiography on 04/20/17. He had a drug-coated balloon angioplasty of the right superficial femoral artery balloon angioplasty right common femoral artery. He had stenosis of previously placed stents within the right superficial femoral artery this was treated with drug coated balloon angioplasty. He continues to have single vessel runoff via the peroneal artery He has been using Hydrofera Blue 04/29/17; no major change this week. Wound beds appear healthy. No debridement. He apparently did not tolerate Iodoflex which I was using prior to modification. I'm going to change him to Endo form with hydrogel 05/13/17; wound beds again appear a lot better, paradoxically the wound on the dorsal foot appears to be progressing better than the proximal wound. Using Endoform with hydrogel 06/03/17; doing well. wounds smaller especially the wound on the dorsal foot. using endoform 06/17/17; two-week follow-up everything is going really nicely here. Using Endoform he only has one small open area left on the right dorsal foot/ankle. Also improved on the right anterior leg. He is retired from smoking since starting with [place] in this clinic 07/01/17; two-week follow-up the area on his right anterior foot/ankle is just about  closed. The area superiorly on the lower mid calf as adherent necrotic debris which will need debridement but it is also down in terms of  dimensions. We'll use Endoform 07/19/17 on evaluation today patient appears to be doing well with his Current wound care measures and there does not appear to be necrotic tissue which is good news. The Endoform however does seem to be causing some epithelialization although this is loose at the proximal/12 o'clock location and seems to be pulling off with dressing changes. No fevers, chills, nausea, or vomiting noted at this time. He has no significant discomfort. 08/05/17; the patient has 2 small open areas on the proximal foot and a larger wound on the right anterior leg. Change to [facility] Blue last time probably because of the hyper granulation proximally 08/26/17; the patient has 2 small open areas on the proximal foot wound which is closed. The other is covered with a thin surface eschar. There is a similar wound on the right anterior leg. We've been using Hydrofera Blue 09/09/17*; the patient went for angiogram with Dr. Myra Gianotti. Apparently a routine scheduled arterial duplex suggested occlusion of his stent however with the angiography the stent was widely patent. No procedure was necessary. He also apparently has had a long-standing pressure area on the left buttock which dates back to prior to Thanksgiving although he's never really said anything to anybody about this. He is been attempting to treated himself with triple antibiotic cream etc. We've been using silver collagen to the wounds on his right leg and dorsal right foot. These are largely unchanged today 09/23/17; patient's wounds are smaller in both areas and are just about closed. We have been using silver collagen he shows me an area on his buttock that is in the gluteal fold. This would be an atypical place for a pressure sore like a herpetic lesion although he says it's been there for a long  time 2/14/19the patient's wound is totally closed. The area on the dorsal foot is also close to closing. I looked again at the area on his left buttock in the gluteal fold. He tells me that this is been there for 6 months. This more looks more like a pressure ulcer to me than it did when I looked at this 2 weeks ago. I simply put topical antibiotics on this but we are going to have to change to a more formal dressing 10/13/17 on evaluation today patient appears to be doing very well in regard to his right dorsal foot wound. He has been tolerating the dressing changes without complication. Fortunately there does not appear to be any evidence of infection which is good news. He continues to have a left gluteal ulcer as well as he has been tolerating the Prisma dressing on although this is not significantly smaller it does sound like he's been putting other things on this as well including some kind of frame he in fact this order another cream as well although I'm not exactly sure what this is. It's something that he "researched on the Internet that is supposed to make My wound healed him seven days." At worst his pain is a 2/10 in regard to the left gluteal region but this is mainly with cleansing of the wound. 10/21/17; his right dorsal foot wound is just about closed. The area on the right anterior shin area is closed. He has a left gluteal ulcer which is probably a pressure ulcer. He is been using his own creams that he spot on the Internet. This wound is also better. 11/04/17; this is a patient with severe PAD who is been revascularized by [facility]. He had  2 difficult wound areas one on the right anterior shin and the right anterior dorsal foot. More recently he has developed a left gluteal ulcer which is probably a pressure ulcer although it has some atypical nature to it. His proximal right leg has remained healed. He has a small open area on the right dorsal foot with a nonviable  surface 11/18/17; this is a patient with severe PAD who is been revascularized by [facility]. The area on the right proximal leg has healed over. He has a very small ischemic wound on the right dorsal foot which was the second wound. He also had a pressure ulcer on his buttock which is healed out as of today 12/02/17; wound on the dorsal foot which I think is largely ischemic. He is upset with me for doing a surface debridement on this last visit however he still has a nonviable surface on this and I think this is largely a blood flow issue 12/16/17; wound on the dorsal foot which I think is largely ischemic. He sees Dr. [place] Sink ham again next week. We've been using Iodoflex to this not much of a healthy surface. 12/30/17; the patient went back to see vascular surgery. He had noninvasive testing. His ABI on the right was 0.70 compared to 0.60 last time on the left 0.57 compared to 0.44. TBI's were 0.3 on the right 0.29 on the left essentially unchanged from last time. Waveforms were monophasic. He was seen by a provider. Noted nonpalpable pulses. Overall they felt that they had slightly improved bilateral ABIs and right TBI. Her disease bilaterally with all waveforms monophasic. They will follow his lower extremity arterial duplex in 6 months. They're calling the areas on his feet venous stasis ulcers. He does not clearly describe claudication 01/13/18; The patient's wound is slightly larger in width and nonviable surface. We've been using Iodoflex. He has home help changing the dressings. I have been reluctant to do debridement on this since he blames a debridement for reopening his wound actually still think this is arterial even given recent arterial results and evaluation [see above) 01/27/18; using iodoflex. better surface. 02/10/18; using Iodoflex. Surface is some better. Changed him to silver collagen today 03/02/18 on evaluation today patient continues to show signs of slow but good progress  in regard to his left lower extremity ulcer. He has been tolerating the dressing changes without complication which is good news. He does have somewhat limited/poor vascular flow but nonetheless he does seem to be healing just at a much slower place and what would be otherwise recommended and expected. I do believe the silver collagen dressing change has been of benefit. 03/22/18; slow but good progress in terms of his right anterior foot wound. These are largely ischemic wounds but he has been revascularized. He is continued with silver collagen 04/07/18 on evaluation today patient appears to be doing rather well in regard to the wound on the dorsal surface of his right foot. He's been tolerating the dressing changes without complication. Fortunately there does not appear to be the evidence of infection at this time which is great news. Overall I'm very pleased with how things seem to appear at this point. 04/21/18; dorsal right foot wound secondary to PAD. Wound is making progress by 2 mm. Surface of it is passable. He is been using silver collagen 05/05/18; we received a call from home health this week that the original proximal tibial area had reopened on the right. He now has 2 open areas  including the one we've been watching for a long period on the foot. The new open area seems to be right in the same place as the original wound previously was located 05/13/2018; wounds are both actually larger. I debrided these last week both wounds are bigger. The surface looks better I been using Iodoflex. 05/27/2018; the wounds are larger but the surface of them looks better with Iodoflex. He has a follow-up with Dr. Myra Gianotti sometime in November. I tried to move these up we were not able to get an earlier appointment I will try to text him. 06/16/2018; the patient's wounds are larger but with a generally cleaner surface. He has extensive set of investigations with Dr. Myra Gianotti on November 5. This includes  ABIs TBI's and I believe an ultrasound of the stent. 07/04/2018; the patient apparently stated he felt too dizzy to go for his arterial studies on 11/5 therefore he canceled him. He has not been able to rebook them. I suspect these are ischemic wounds and I told him this. 09/01/2018; he is not been here in almost 2 months. He has been getting his dressing changed by [facility] I believe silver collagen. They are discharging him next week due to nonimprovement of the wounds predominantly. He did not follow-up with vascular surgery he was supposed to have follow-up arterial studies noninvasive. I am not sure that they have been rebooked and I do not think he has been seen. 1/23; patient's wounds are slightly larger. I believe this is ischemic. He canceled his last follow-up appointment with Dr. Myra Gianotti him that included noninvasive arterial studies. He says this was because of vertigo. He has not rescheduled these. He also has lost home health because of the chronicity of his wounds 2/6; the patient's wound on his right anterior tibia actually looks quite good. Not much difference in size but the surface of the wound looks good. He has a small area on the dorsal foot debris on the surface of this which I debrided with Anasept and gauze. He has not made a follow-up with [facility] vascular 2/20; the patient's wound on his right anterior tibia continues to look good although it is not coming down in size much. The area on his right dorsal foot is about the same. Tightly adherent debris. The wound surface does not look healthy care. The patient is expressed his to sustain for mechanical debridement in the past, I therefore been avoiding this at least until he goes back for repeat vascular appointment. So far he has not done this he is mingling for ENT appointments related to vertigo 3/5; right anterior tibia continues to look satisfactory perhaps slightly smaller. The area on the right dorsal foot  is smaller. We have been using Hydrofera Blue. 4/16; the patient wounds have actually done well in the hiatus since we have last seen him. He is using Hydrofera Blue ready 6/25; we have not seen this patient in over 2 months as he has been in the isolation phase for the Covid epidemic. He is using Hydrofera Blue ready. He has 2 wounds right on the right anterior mid tibia and one on the right dorsal foot. Both of these are somewhat improved today. Especially the dorsal 1 over the tibia. He has not been for follow- up arterial studies but I have got him to agree to allow [place] to set these up. I 7/16; the patient's wound proximally in the mid tibia area has closed over once again. We had this closed over before  and he reopened the area on the dorsal foot appears better. He has been using Hydrofera Blue. We do not have an appointment for the follow-up arterial studies I ordered nor do I think he is really all that interested in getting them done. 7/30; patient has been using Hydrofera Blue. The dorsal foot wound looks some better however he has had a reopening in the mid tibia area. This is been closed over the last time. The opening is very superficial. We have been trying to get him follow-up vascular studies done with [facility] for about a year. He refused to do these at one point because he was at work getting worked up for vertigo. He is now more ready to move forward with this. 8/20; patient is using Hydrofera Blue. He has not made an appointment for his follow-up noninvasive vascular studies and I have talked to him about this again. Both wound areas are still open on the right mid tibia and the right dorsal foot although I think they are smaller 05/04/19-Patient is back after his vascular visit his studies revealed that he is got ABI of 0.6 on the right, 0.46 on the left, monophasic flow to both right and left anterior tibial arteries is got great pressures to the right great  toe. 10/8; we still do not have a vascular surgery appointment 1 month later. I think he may require a repeat angiogram his wounds on the right anterior to mid tibia is quite a bit larger the area on the right dorsal ankle, is small. Very little open area here 11/12; patient has not been here in over a month. He still did not have a vascular surgery appointment however before he left the clinic today we managed to arrange that for 12/9. The area on his right dorsal foot is surprisingly closed over today however the area in the mid tibia is quite a bit larger than when we saw this last time. 12/3; patient has a vascular appointment next week in follow-up from his previous revascularization. The area on the right dorsal foot remains closed. He still has a fairly sizable wound on the anterior tibia but it is superficial. We have been using silver collagen 12/28. The patient saw Langston Reusing the nurse practitioner at [facility]. Noted that the most recent ABIs were in September 2020 which showed a mild decline bilaterally a 0.61 on the right with monophasic waveforms and 0.46 on the left with monophasic waveforms. It was felt that the area on his right lower leg was a venous ulcer. The area on the dorsal foot remains closed. Follow-up ABIs were ordered for 6 months. We have been ordering silver collagen 1/28; monthly follow-up. Not much change in the wound on the anterior mid tibia. We have been using silver collagen for a prolonged period of time now. He has been reviewed by vascular with regards to his known PAD. They did not think that he needed any further interventions for now and ordered follow-up ABIs in 6 months. We did manage to heal the area on the dorsal foot and that remains closed 2/25; monthly follow-up. The wound on the right anterior mid tibia is better. We changed to polymen last time there is less surrounding maceration and the wound bed looks healthy. 3/18; monthly  follow-up. The wound on the right anterior mid tibia is better smaller we've been using PolyMem Objective Constitutional Sitting or standing Blood Pressure is within target range for patient.. Pulse regular and within target  range for patient.Marland Kitchen Respirations regular, non-labored and within target range.. Temperature is normal and within the target range for the patient.Marland Kitchen Appears in no distress. Vitals Time Taken: 2:45 PM, Height: 61 in, Weight: 122 lbs, BMI: 23, Temperature: 97.5 F, Pulse: 79 bpm, Respiratory Rate: 19 breaths/min, Blood Pressure: 124/81 mmHg. General Notes: wound exam; very faint pedal pulses. The remaining wound is on the right anterior mid tibia. Washed off with antiseptic gauze this cleans up nicely very small. This open area is nondescript no mechanical debridement is required there is no evidence of surrounding infection. The other wound was the distal wound on the dorsal foot this is remaining closed for some time now Integumentary (Hair, Skin) Wound #5R status is Open. Original cause of wound was Gradually Appeared. The wound is located on the Right,Anterior Lower Leg. The wound measures 1.1cm length x 0.7cm width x 0.1cm depth; 0.605cm^2 area and 0.06cm^3 volume. There is Fat Layer (Subcutaneous Tissue) Exposed exposed. There is no tunneling or undermining noted. There is a medium amount of serosanguineous drainage noted. The wound margin is flat and intact. There is large (67-100%) red granulation within the wound bed. There is a small (1-33%) amount of necrotic tissue within the wound bed including Adherent Slough. Assessment Active Problems ICD-10 Non-pressure chronic ulcer of right calf with necrosis of muscle Type 2 diabetes mellitus with other skin ulcer Type 2 diabetes mellitus with foot ulcer Type 2 diabetes mellitus with diabetic peripheral angiopathy without gangrene Plan Follow-up Appointments: Return appointment in 3 weeks. Dressing Change  Frequency: Wound #5R Right,Anterior Lower Leg: Other: - twice a week Skin Barriers/Peri-Wound Care: Barrier cream - apply zinc oxide around the wound bed for wetness, reddness, or maceration. Moisturizing lotion - to leg Wound Cleansing: Wound #5R Right,Anterior Lower Leg: May shower with protection. Primary Wound Dressing: Wound #5R Right,Anterior Lower Leg: Polymem Secondary Dressing: Wound #5R Right,Anterior Lower Leg: Foam Border - or bordered gauze. Edema Control: Avoid standing for long periods of time Elevate legs to the level of the heart or above for 30 minutes daily and/or when sitting, a frequency of: - throughout the day Other: - patient to purchase support stockings for protection. Off-Loading: Turn and reposition every 2 hours #1 continue PolyMem #2 is changing this every second day the wound seems to be responding nicely Electronic Signature(s) Signed: 11/09/2019 7:02:26 PM By: Baltazar Najjar MD Entered By: Baltazar Najjar on 11/09/2019 18:56:11 -------------------------------------------------------------------------------- SuperBill Details Patient Name: Date of Service: Reginald Ayers 11/09/2019 Medical Record [id number] Patient Account Number: [id number] Date of Birth/Sex: Treating RN: 1951/07/17 (69 y.o. Harlon Flor, Millard.Loa Primary Care Provider: Katy Apo Other Clinician: Referring Provider: Treating Provider/Extender:Zavian Slowey, [name], [name]: 191 Diagnosis Coding ICD-10 Codes Code Description [phone] Non-pressure chronic ulcer of right calf with necrosis of muscle E11.622 Type 2 diabetes mellitus with other skin ulcer E11.621 Type 2 diabetes mellitus with foot ulcer E11.51 Type 2 diabetes mellitus with diabetic peripheral angiopathy without gangrene Facility Procedures The patient participates with Medicare or their insurance follows the Medicare Facility Guidelines: CPT4 Code Description Modifier Quantity  [phone] - WOUND CARE VISIT-LEV 3 EST PT 1 Physician Procedures CPT4 Code Description: [id number] - WC PHYS LEVEL 3 - EST PT ICD-10 Diagnosis Description L97.213 Non-pressure chronic ulcer of right calf with necrosis E11.622 Type 2 diabetes mellitus with other skin ulcer Modifier: of muscle Quantity: 1 Electronic Signature(s) Signed: 11/09/2019 7:02:26 PM By: Baltazar Najjar MD Previous Signature: 11/09/2019 4:55:35 PM Version By: Shawn Stall  Entered By: Baltazar Najjar on 11/09/2019 18:56:51

## 2019-11-09 NOTE — Progress Notes (Addendum)
COTTRELL, GENTLES ([id number]) Visit Report for 11/09/2019 Arrival Information Details Patient Name: Date of Service: BURLEIGH, BROCKMANN 11/09/2019 2:30 PM Medical Record [id number] Patient Account Number: [id number] Date of Birth/Sex: Treating RN: 1951-07-01 (69 y.o. [name] Primary Care Jmya Uliano: Kandice Hams Other Clinician: Referring Makalynn Berwanger: Treating Deya Bigos/Extender:Robson, Alice Reichert, Lowella Dandy in Treatment: 66 Visit Information History Since Last Visit Added or deleted any medications: No Patient Arrived: Kasandra Knudsen Any new allergies or adverse reactions: No Arrival Time: 14:44 Had a fall or experienced change in No Accompanied By: self activities of daily living that may affect Transfer Assistance: None risk of falls: Patient Identification Verified: Yes Signs or symptoms of abuse/neglect since last No Secondary Verification Process Yes visito Completed: Hospitalized since last visit: No Patient Requires Transmission- No Implantable device outside of the clinic excluding No Based Precautions: cellular tissue based products placed in the center Patient Has Alerts: Yes since last visit: Patient Alerts: Patient on Blood Has Dressing in Place as Prescribed: Yes Thinner Pain Present Now: No Electronic Signature(s) Signed: 11/09/2019 4:36:06 PM By: Kela Millin Entered By: Kela Millin on 11/09/2019 14:47:34 -------------------------------------------------------------------------------- Clinic Level of Care Assessment Details Patient Name: Date of Service: MARSTON, MCCADDEN 11/09/2019 2:30 PM Medical Record [id number] Patient Account Number: [id number] Date of Birth/Sex: Treating RN: 12/13/1950 (69 y.o. [name] Primary Care Cheynne Virden: Kandice Hams Other Clinician: Referring Aneyah Lortz: Treating Rithwik Schmieg/Extender:Robson, Alice Reichert, Lowella Dandy in Treatment: [facility] Level of Care Assessment Items TOOL 4  Quantity Score X - Use when only an EandM is performed on FOLLOW-UP visit 1 0 ASSESSMENTS - Nursing Assessment / Reassessment X - Reassessment of Co-morbidities (includes updates in patient status) 1 10 X - Reassessment of Adherence to Treatment Plan 1 5 ASSESSMENTS - Wound and Skin Assessment / Reassessment X - Simple Wound Assessment / Reassessment - one wound 1 5 []  - Complex Wound Assessment / Reassessment - multiple wounds 0 X - Dermatologic / Skin Assessment (not related to wound area) 1 10 ASSESSMENTS - Focused Assessment X - Circumferential Edema Measurements - multi extremities 1 5 X - Nutritional Assessment / Counseling / Intervention 1 10 []  - Lower Extremity Assessment (monofilament, tuning fork, pulses) 0 []  - Peripheral Arterial Disease Assessment (using hand held doppler) 0 ASSESSMENTS - Ostomy and/or Continence Assessment and Care []  - Incontinence Assessment and Management 0 []  - Ostomy Care Assessment and Management (repouching, etc.) 0 PROCESS - Coordination of Care X - Simple Patient / Family Education for ongoing care 1 15 []  - Complex (extensive) Patient / Family Education for ongoing care 0 X - Staff obtains Programmer, systems, Records, Test Results / Process Orders 1 10 []  - Staff telephones HHA, Nursing Homes / Clarify orders / etc 0 []  - Routine Transfer to another Facility (non-emergent condition) 0 []  - Routine Hospital Admission (non-emergent condition) 0 []  - New Admissions / Biomedical engineer / Ordering NPWT, Apligraf, etc. 0 []  - Emergency Hospital Admission (emergent condition) 0 X - Simple Discharge Coordination 1 10 []  - Complex (extensive) Discharge Coordination 0 PROCESS - Special Needs []  - Pediatric / Minor Patient Management 0 []  - Isolation Patient Management 0 []  - Hearing / Language / Visual special needs 0 []  - Assessment of Community assistance (transportation, D/C planning, etc.) 0 []  - Additional assistance / Altered mentation 0 []  -  Support Surface(s) Assessment (bed, cushion, seat, etc.) 0 INTERVENTIONS - Wound Cleansing / Measurement X - Simple Wound Cleansing - one wound 1 5 []  -  Complex Wound Cleansing - multiple wounds 0 X - Wound Imaging (photographs - any number of wounds) 1 5 []  - Wound Tracing (instead of photographs) 0 X - Simple Wound Measurement - one wound 1 5 []  - Complex Wound Measurement - multiple wounds 0 INTERVENTIONS - Wound Dressings X - Small Wound Dressing one or multiple wounds 1 10 []  - Medium Wound Dressing one or multiple wounds 0 []  - Large Wound Dressing one or multiple wounds 0 []  - Application of Medications - topical 0 []  - Application of Medications - injection 0 INTERVENTIONS - Miscellaneous []  - External ear exam 0 []  - Specimen Collection (cultures, biopsies, blood, body fluids, etc.) 0 []  - Specimen(s) / Culture(s) sent or taken to Lab for analysis 0 []  - Patient Transfer (multiple staff / / Similar devices) 0 []  - Simple Staple / Suture removal (25 or less) 0 []  - Complex Staple / Suture removal (26 or more) 0 []  - Hypo / Hyperglycemic Management (close monitor of Blood Glucose) 0 []  - Ankle / Brachial Index (ABI) - do not check if billed separately 0 X - Vital Signs 1 5 Has the patient been seen at the hospital within the last three years: Yes Total Score: 110 Level Of Care: New/Established - Level 3 Electronic Signature(s) Signed: 11/09/2019 4:55:35 PM By: Entered By: on 11/09/2019 15:18:26 -------------------------------------------------------------------------------- Encounter Discharge Information Details Patient Name: Date of Service: 11/09/2019 2:30 PM Medical Record Patient Account Number: Date of Birth/Sex: Treating RN: [birth year] (69 y.o. Primary Care Ifeanyi Mickelson: Other Clinician: Referring Tirrell Buchberger: Treating Ladonna Vanorder/Extender:Robson,  , in Treatment: 191 Encounter Discharge Information Items Discharge Condition: Stable Ambulatory Status: Cane Discharge Destination: Home Transportation: Private Auto Accompanied By: self Schedule Follow-up Appointment: Yes Clinical Summary of Care: Electronic Signature(s) Signed: 11/09/2019 4:55:35 PM By: Shawn Stall Entered By: Shawn Stall on 11/09/2019 15:29:34 -------------------------------------------------------------------------------- Lower Extremity Assessment Details Patient Name: Date of Service: BETZALEL, UMBARGER 11/09/2019 2:30 PM Medical Record [id number] Patient Account Number: [id number] Date of Birth/Sex: Treating RN: 02-14-1951 (69 y.o. [name] Primary Care Kyrsten Deleeuw: Katy Apo Other Clinician: Referring Elara Cocke: Treating Saori Umholtz/Extender:Robson, [name], [name]: 191 Edema Assessment Assessed: [Left: No] [Right: No] Edema: [Left: Ye] [Right: s] Calf Left: Right: Point of Measurement: 30 cm From Medial Instep cm 30 cm Ankle Left: Right: Point of Measurement: 10 cm From Medial Instep cm 19 cm Vascular Assessment Pulses: Dorsalis Pedis Palpable: [Right:Yes] Electronic Signature(s) Signed: 11/09/2019 4:36:06 PM By: Shawn Stall Entered By: Shawn Stall on 11/09/2019 14:47:56 -------------------------------------------------------------------------------- Multi Wound Chart Details Patient Name: Date of Service: [name] 11/09/2019 2:30 PM Medical Record [id number] Patient Account Number: [id number] Date of Birth/Sex: Treating RN: 25-Mar-1951 (69 y.o. 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] Comorbid History: [5R:Chronic sinus problems/congestion, Chronic Obstructive Pulmonary Disease (COPD), Coronary Artery Disease, Hypertension, Peripheral Arterial Disease, Type II Diabetes, Received Chemotherapy] [N/A:N/A] Date Acquired: [5R:04/21/2018] [N/A:N/A] Weeks of Treatment: [5R:79] [N/A:N/A] Wound Status: [5R:Open] [N/A:N/A] Wound Recurrence: [5R:Yes] [N/A:N/A] Measurements L x W x D  1.1x0.7x0.1 [N/A:N/A] (cm) Area (cm) : [5R:0.605] [N/A:N/A] Volume (cm) : [5R:0.06] [N/A:N/A] % Reduction in Area: [5R:35.80%] [N/A:N/A] % Reduction in Volume: 36.20% [N/A:N/A] Classification: [5R:Grade 2] [N/A:N/A] Exudate Amount: [5R:Medium] [N/A:N/A] Exudate Type: [5R:Serosanguineous] [N/A:N/A] Exudate Color: [5R:red, brown] [N/A:N/A] Wound Margin: [5R:Flat and Intact] [N/A:N/A] Granulation Amount: [5R:Large (67-100%)] [N/A:N/A] Granulation Quality: [5R:Red] [N/A:N/A] Necrotic Amount: [5R:Small (1-33%)] [N/A:N/A] Exposed Structures: [5R:Fat Layer (Subcutaneous Tissue) Exposed: Yes Fascia: No Tendon: No Muscle: No Joint: No Bone: No Small (1-33%)] [N/A:N/A N/A] Treatment Notes Wound #5R (Right, Anterior Lower Leg) 1. Cleanse With Wound Cleanser Soap and water 2. Periwound Care Skin Prep 3. Primary Dressing Applied Polymem 4. Secondary Dressing Foam Border Dressing 5. Secured With Advice worker) Signed: 11/09/2019 7:02:26 PM By: Baltazar Najjar MD Signed: 11/10/2019 5:19:35 PM By: Shawn Stall Entered By: Baltazar Najjar on 11/09/2019 18:52:02 -------------------------------------------------------------------------------- Multi-Disciplinary Care Plan Details Patient Name: Date of  Service: [name] 11/09/2019 2:30 PM Medical Record [id number] Patient Account Number: [id number] Date of Birth/Sex: Treating RN: Jun 12, 1951 (69 y.o. [name] Primary Care Ifrah Vest: Katy Apo Other Clinician: Referring Kadey Mihalic: Treating Jyla Hopf/Extender:Robson, [name], [name]: 191 Active Inactive Nutrition Nursing Diagnoses: Potential for alteratiion in Nutrition/Potential for imbalanced nutrition Goals: Patient/caregiver agrees to and verbalizes understanding of need to obtain nutritional consultation Date Initiated: 03/09/2019 Target Resolution Date: 12/22/2019 Goal Status: Active Interventions: Provide education on nutrition Treatment Activities: Education provided on Nutrition : 10/19/2019 Patient referred to Primary Care Physician for further nutritional evaluation : 03/09/2019 Notes: Electronic Signature(s) Signed: 11/09/2019 4:55:35 PM By: Shawn Stall Entered By: Shawn Stall on 11/09/2019 14:39:46 -------------------------------------------------------------------------------- Pain Assessment Details Patient Name: Date of Service: SAQUAN, FURTICK 11/09/2019 2:30 PM Medical Record [id number] Patient Account Number: [id number] Date of Birth/Sex: Treating RN: 1951-05-19 (69 y.o. Katherina Right Primary Care Shivan Hodes: Katy Apo Other Clinician: Referring Chaia Ikard: Treating Nasif Bos/Extender:Robson, [name], [name]: 191 Active Problems Location of Pain Severity and Description of Pain Patient Has Paino No Site Locations Pain Management and Medication Current Pain Management: Electronic Signature(s) Signed: 11/09/2019 4:36:06 PM By: Cherylin Mylar Entered By: Cherylin Mylar on 11/09/2019 14:49:09 -------------------------------------------------------------------------------- Patient/Caregiver Education Details Patient Name: Date of Service: [name] 3/18/2021andnbsp2:30 PM Medical Record [id number] Patient Account Number: [id number] Date of Birth/Gender: 11/13/1950 (69 y.o. M) Treating RN: Shawn Stall Primary Care Physician: Katy Apo Other Clinician: Referring Physician: Treating Physician/Extender:Robson, [name], [name]: 191 Education Assessment Education Provided To: Patient Education Topics Provided Wound/Skin Impairment: Handouts: Skin Care Do's and Dont's Methods: Explain/Verbal Responses: Reinforcements needed Electronic Signature(s) Signed: 11/09/2019 4:55:35 PM By: Shawn Stall Entered By: Shawn Stall on 11/09/2019 14:40:00 -------------------------------------------------------------------------------- Wound Assessment Details Patient Name: Date of Service: JEBEDIAH, MACRAE 11/09/2019 2:30 PM Medical Record [id number] Patient Account Number: [id number] Date of Birth/Sex: Treating RN: 10-Mar-1951 (69 y.o. Katherina Right Primary Care Glynnis Gavel: Katy Apo Other Clinician: Referring Jecenia Leamer: Treating Dick Hark/Extender:Robson, [name], [name]: 191 Wound Status Wound Number: 5R Primary Diabetic Wound/Ulcer of the Lower Extremity Etiology: Wound Location: Right Lower Leg - Anterior Wound Open Wounding Event: Gradually Appeared Status: Date Acquired: 04/21/2018 Comorbid Chronic sinus problems/congestion, Chronic Weeks Of Treatment: 79 History: Obstructive Pulmonary Disease (COPD), Clustered Wound: No Coronary Artery Disease, Hypertension, Peripheral Arterial Disease, Type II Diabetes, Received Chemotherapy Photos Photo Uploaded By: Benjaman Kindler on 11/10/2019 15:55:56 Wound Measurements Length: (cm) 1.1 % Reduction Width: (cm) 0.7 % Reduction Depth: (cm) 0.1 Epithelializ Area: (  cm) 0.605 Tunneling: Volume: (cm) 0.06 Undermining Wound Description Classification: Grade 2 Wound Margin: Flat and Intact Exudate  Amount: Medium Exudate Type: Serosanguineous Exudate Color: red, brown Wound Bed Granulation Amount: Large (67-100%) Granulation Quality: Red Necrotic Amount: Small (1-33%) Necrotic Quality: Adherent Slough Foul Odor After Cleansing: No Slough/Fibrino Yes Exposed Structure Fascia Exposed: No Fat Layer (Subcutaneous Tissue) Exposed: Yes Tendon Exposed: No Muscle Exposed: No Joint Exposed: No Bone Exposed: No in Area: 35.8% in Volume: 36.2% ation: Small (1-33%) No : No Treatment Notes Wound #5R (Right, Anterior Lower Leg) 1. Cleanse With Wound Cleanser Soap and water 2. Periwound Care Skin Prep 3. Primary Dressing Applied Polymem 4. Secondary Dressing Foam Border Dressing 5. Secured With Advice worker) Signed: 11/09/2019 4:36:06 PM By: Cherylin Mylar Entered By: Cherylin Mylar on 11/09/2019 14:49:51 -------------------------------------------------------------------------------- Vitals Details Patient Name: Date of Service: [name] 11/09/2019 2:30 PM Medical Record [id number] Patient Account Number: [id number] Date of Birth/Sex: Treating RN: [birth year] (69 y.o. Katherina Right Primary Care Nuel Dejaynes: Katy Apo Other Clinician: Referring Ladoris Lythgoe: Treating Yona Stansbury/Extender:Robson, [name], [name]: 191 Vital Signs Time Taken: 14:45 Temperature (F): 97.5 Height (in): 61 Pulse (bpm): 79 Weight (lbs): 122 Respiratory Rate (breaths/min): 19 Body Mass Index (BMI): 23 Blood Pressure (mmHg): 124/81 Reference Range: 80 - 120 mg / dl Electronic Signature(s) Signed: 11/09/2019 4:36:06 PM By: Cherylin Mylar Entered By: Cherylin Mylar on 11/09/2019 14:49:01

## 2019-11-10 ENCOUNTER — Ambulatory Visit: Payer: Medicare Other | Attending: Internal Medicine

## 2019-11-10 DIAGNOSIS — Z23 Encounter for immunization: Secondary | ICD-10-CM

## 2019-11-10 NOTE — Progress Notes (Signed)
   Covid-19 Vaccination Clinic  Name:  Reginald Ayers    MRN: [id number] DOB: 10-07-1950  11/10/2019  [name] was observed post Covid-19 immunization for 15 minutes without incident. He was provided with Vaccine Information Sheet and instruction to access the V-Safe system.   [name] was instructed to call 911 with any severe reactions post vaccine: Marland Kitchen Difficulty breathing  . Swelling of face and throat  . A fast heartbeat  . A bad rash all over body  . Dizziness and weakness   Immunizations Administered    Name Date Dose VIS Date Route   Pfizer COVID-19 Vaccine 11/10/2019  3:03 PM 0.3 mL 08/04/2019 Intramuscular   Manufacturer: [employer], [employer]   Lot: [id number]   NDC: [address]-7

## 2019-11-13 ENCOUNTER — Encounter: Payer: Self-pay | Admitting: Podiatry

## 2019-11-13 ENCOUNTER — Ambulatory Visit (INDEPENDENT_AMBULATORY_CARE_PROVIDER_SITE_OTHER): Payer: Medicare Other | Admitting: Podiatry

## 2019-11-13 ENCOUNTER — Other Ambulatory Visit: Payer: Self-pay

## 2019-11-13 DIAGNOSIS — M79674 Pain in right toe(s): Secondary | ICD-10-CM

## 2019-11-13 DIAGNOSIS — B351 Tinea unguium: Secondary | ICD-10-CM | POA: Diagnosis not present

## 2019-11-13 DIAGNOSIS — M79675 Pain in left toe(s): Secondary | ICD-10-CM

## 2019-11-13 DIAGNOSIS — E1151 Type 2 diabetes mellitus with diabetic peripheral angiopathy without gangrene: Secondary | ICD-10-CM

## 2019-11-13 NOTE — Patient Instructions (Signed)
Diabetes Mellitus and Foot Care Foot care is an important part of your health, especially when you have diabetes. Diabetes may cause you to have problems because of poor blood flow (circulation) to your feet and legs, which can cause your skin to:  Become thinner and drier.  Break more easily.  Heal more slowly.  Peel and crack. You may also have nerve damage (neuropathy) in your legs and feet, causing decreased feeling in them. This means that you may not notice minor injuries to your feet that could lead to more serious problems. Noticing and addressing any potential problems early is the best way to prevent future foot problems. How to care for your feet Foot hygiene  Wash your feet daily with warm water and mild soap. Do not use hot water. Then, pat your feet and the areas between your toes until they are completely dry. Do not soak your feet as this can dry your skin.  Trim your toenails straight across. Do not dig under them or around the cuticle. File the edges of your nails with an emery board or nail file.  Apply a moisturizing lotion or petroleum jelly to the skin on your feet and to dry, brittle toenails. Use lotion that does not contain alcohol and is unscented. Do not apply lotion between your toes. Shoes and socks  Wear clean socks or stockings every day. Make sure they are not too tight. Do not wear knee-high stockings since they may decrease blood flow to your legs.  Wear shoes that fit properly and have enough cushioning. Always look in your shoes before you put them on to be sure there are no objects inside.  To break in new shoes, wear them for just a few hours a day. This prevents injuries on your feet. Wounds, scrapes, corns, and calluses  Check your feet daily for blisters, cuts, bruises, sores, and redness. If you cannot see the bottom of your feet, use a mirror or ask someone for help.  Do not cut corns or calluses or try to remove them with medicine.  If you  find a minor scrape, cut, or break in the skin on your feet, keep it and the skin around it clean and dry. You may clean these areas with mild soap and water. Do not clean the area with peroxide, alcohol, or iodine.  If you have a wound, scrape, corn, or callus on your foot, look at it several times a day to make sure it is healing and not infected. Check for: ? Redness, swelling, or pain. ? Fluid or blood. ? Warmth. ? Pus or a bad smell. General instructions  Do not cross your legs. This may decrease blood flow to your feet.  Do not use heating pads or hot water bottles on your feet. They may burn your skin. If you have lost feeling in your feet or legs, you may not know this is happening until it is too late.  Protect your feet from hot and cold by wearing shoes, such as at the beach or on hot pavement.  Schedule a complete foot exam at least once a year (annually) or more often if you have foot problems. If you have foot problems, report any cuts, sores, or bruises to your health care provider immediately. Contact a health care provider if:  You have a medical condition that increases your risk of infection and you have any cuts, sores, or bruises on your feet.  You have an injury that is not   healing.  You have redness on your legs or feet.  You feel burning or tingling in your legs or feet.  You have pain or cramps in your legs and feet.  Your legs or feet are numb.  Your feet always feel cold.  You have pain around a toenail. Get help right away if:  You have a wound, scrape, corn, or callus on your foot and: ? You have pain, swelling, or redness that gets worse. ? You have fluid or blood coming from the wound, scrape, corn, or callus. ? Your wound, scrape, corn, or callus feels warm to the touch. ? You have pus or a bad smell coming from the wound, scrape, corn, or callus. ? You have a fever. ? You have a red line going up your leg. Summary  Check your feet every day  for cuts, sores, red spots, swelling, and blisters.  Moisturize feet and legs daily.  Wear shoes that fit properly and have enough cushioning.  If you have foot problems, report any cuts, sores, or bruises to your health care provider immediately.  Schedule a complete foot exam at least once a year (annually) or more often if you have foot problems. This information is not intended to replace advice given to you by your health care provider. Make sure you discuss any questions you have with your health care provider. Document Revised: 05/03/2019 Document Reviewed: 09/11/2016 [employer] Patient Education  2020 [employer]  Peripheral Neuropathy Peripheral neuropathy is a type of nerve damage. It affects nerves that carry signals between the spinal cord and the arms, legs, and the rest of the body (peripheral nerves). It does not affect nerves in the spinal cord or brain. In peripheral neuropathy, one nerve or a group of nerves may be damaged. Peripheral neuropathy is a broad category that includes many specific nerve disorders, like diabetic neuropathy, hereditary neuropathy, and carpal tunnel syndrome. What are the causes? This condition may be caused by:  Diabetes. This is the most common cause of peripheral neuropathy.  Nerve injury.  Pressure or stress on a nerve that lasts a long time.  Lack (deficiency) of B vitamins. This can result from alcoholism, poor diet, or a restricted diet.  Infections.  Autoimmune diseases, such as rheumatoid arthritis and systemic lupus erythematosus.  Nerve diseases that are passed from parent to child (inherited).  Some medicines, such as cancer medicines (chemotherapy).  Poisonous (toxic) substances, such as lead and mercury.  Too little blood flowing to the legs.  Kidney disease.  Thyroid disease. In some cases, the cause of this condition is not known. What are the signs or symptoms? Symptoms of this condition depend on which of your  nerves is damaged. Common symptoms include:  Loss of feeling (numbness) in the feet, hands, or both.  Tingling in the feet, hands, or both.  Burning pain.  Very sensitive skin.  Weakness.  Not being able to move a part of the body (paralysis).  Muscle twitching.  Clumsiness or poor coordination.  Loss of balance.  Not being able to control your bladder.  Feeling dizzy.  Sexual problems. How is this diagnosed? Diagnosing and finding the cause of peripheral neuropathy can be difficult. Your health care provider will take your medical history and do a physical exam. A neurological exam will also be done. This involves checking things that are affected by your brain, spinal cord, and nerves (nervous system). For example, your health care provider will check your reflexes, how you move, and   what you can feel. You may have other tests, such as:  Blood tests.  Electromyogram (EMG) and nerve conduction tests. These tests check nerve function and how well the nerves are controlling the muscles.  Imaging tests, such as CT scans or MRI to rule out other causes of your symptoms.  Removing a small piece of nerve to be examined in a lab (nerve biopsy). This is rare.  Removing and examining a small amount of the fluid that surrounds the brain and spinal cord (lumbar puncture). This is rare. How is this treated? Treatment for this condition may involve:  Treating the underlying cause of the neuropathy, such as diabetes, kidney disease, or vitamin deficiencies.  Stopping medicines that can cause neuropathy, such as chemotherapy.  Medicine to relieve pain. Medicines may include: ? Prescription or over-the-counter pain medicine. ? Antiseizure medicine. ? Antidepressants. ? Pain-relieving patches that are applied to painful areas of skin.  Surgery to relieve pressure on a nerve or to destroy a nerve that is causing pain.  Physical therapy to help improve movement and  balance.  Devices to help you move around (assistive devices). Follow these instructions at home: Medicines  Take over-the-counter and prescription medicines only as told by your health care provider. Do not take any other medicines without first asking your health care provider.  Do not drive or use heavy machinery while taking prescription pain medicine. Lifestyle   Do not use any products that contain nicotine or tobacco, such as cigarettes and e-cigarettes. Smoking keeps blood from reaching damaged nerves. If you need help quitting, ask your health care provider.  Avoid or limit alcohol. Too much alcohol can cause a vitamin B deficiency, and vitamin B is needed for healthy nerves.  Eat a healthy diet. This includes: ? Eating foods that are high in fiber, such as fresh fruits and vegetables, whole grains, and beans. ? Limiting foods that are high in fat and processed sugars, such as fried or sweet foods. General instructions   If you have diabetes, work closely with your health care provider to keep your blood sugar under control.  If you have numbness in your feet: ? Check every day for signs of injury or infection. Watch for redness, warmth, and swelling. ? Wear padded socks and comfortable shoes. These help protect your feet.  Develop a good support system. Living with peripheral neuropathy can be stressful. Consider talking with a mental health specialist or joining a support group.  Use assistive devices and attend physical therapy as told by your health care provider. This may include using a walker or a cane.  Keep all follow-up visits as told by your health care provider. This is important. Contact a health care provider if:  You have new signs or symptoms of peripheral neuropathy.  You are struggling emotionally from dealing with peripheral neuropathy.  Your pain is not well-controlled. Get help right away if:  You have an injury or infection that is not healing  normally.  You develop new weakness in an arm or leg.  You fall frequently. Summary  Peripheral neuropathy is when the nerves in the arms, or legs are damaged, resulting in numbness, weakness, or pain.  There are many causes of peripheral neuropathy, including diabetes, pinched nerves, vitamin deficiencies, autoimmune disease, and hereditary conditions.  Diagnosing and finding the cause of peripheral neuropathy can be difficult. Your health care provider will take your medical history, do a physical exam, and do tests, including blood tests and nerve function tests.    Treatment involves treating the underlying cause of the neuropathy and taking medicines to help control pain. Physical therapy and assistive devices may also help. This information is not intended to replace advice given to you by your health care provider. Make sure you discuss any questions you have with your health care provider. Document Revised: 07/23/2017 Document Reviewed: 10/19/2016 Elsevier Patient Education  2020 [employer]  

## 2019-11-16 NOTE — Progress Notes (Signed)
Subjective: Reginald Ayers presents today for follow up of for at risk foot care. Patient has h/o PAD and painful mycotic nails b/l that are difficult to trim. Pain interferes with ambulation. Aggravating factors include wearing enclosed shoe gear. Pain is relieved with periodic professional debridement.   Reginald Ayers states he has a Nurse visiting him at home for a chronic leg wound. She sees him twice weekly for dressing changes.  Allergies  Allergen Reactions  . Actos [Pioglitazone] Shortness Of Breath    Leg swelling   . Lisinopril Cough    Objective: There were no vitals filed for this visit.  Pt 69 y.o. year old Caucasian male WD, WN in NAD. AAO x 3.   Vascular Examination:  Capillary refill time to digits <4 seconds b/l. Faintly palpable DP pulses b/l. Faintly palpable PT pulses b/l. Pedal hair present b/l. Skin temperature gradient warm to cool b/l. Evidence of chronic venous insufficiency b/l LE.  Dermatological Examination: Pedal skin is thin shiny, atrophic bilaterally. No interdigital macerations bilaterally. Toenails 1-5 b/l elongated, dystrophic, thickened, crumbly with subungual debris and tenderness to dorsal palpation. Dressing noted on anterior aspect right shin. Dressing is clean, dry and intact.  Musculoskeletal: Normal muscle strength 5/5 to all lower extremity muscle groups bilaterally, no gross bony deformities bilaterally and no pain crepitus or joint limitation noted with ROM b/l  Neurological: Protective sensation intact 5/5 intact bilaterally with 10g monofilament b/l Vibratory sensation intact b/l  Assessment: 1. Pain due to onychomycosis of toenails of both feet   2. Type II diabetes mellitus with peripheral circulatory disorder (HCC)    Plan: -Continue diabetic foot care principles. Literature dispensed on today.  -Toenails 1-5 b/l were debrided in length and girth with sterile nail nippers and dremel without iatrogenic bleeding.  -Patient to continue  soft, supportive shoe gear daily. -Patient to report any pedal injuries to medical professional immediately. -Patient/POA to call should there be question/concern in the interim.  Return in about 3 months (around 02/13/2020) for diabetic nail trim.

## 2019-11-30 ENCOUNTER — Encounter (HOSPITAL_BASED_OUTPATIENT_CLINIC_OR_DEPARTMENT_OTHER): Payer: Medicare Other | Attending: Internal Medicine | Admitting: Internal Medicine

## 2019-11-30 ENCOUNTER — Other Ambulatory Visit: Payer: Self-pay

## 2019-11-30 DIAGNOSIS — L97213 Non-pressure chronic ulcer of right calf with necrosis of muscle: Secondary | ICD-10-CM | POA: Diagnosis not present

## 2019-11-30 DIAGNOSIS — E11622 Type 2 diabetes mellitus with other skin ulcer: Secondary | ICD-10-CM | POA: Insufficient documentation

## 2019-11-30 DIAGNOSIS — Z955 Presence of coronary angioplasty implant and graft: Secondary | ICD-10-CM | POA: Diagnosis not present

## 2019-11-30 DIAGNOSIS — E1151 Type 2 diabetes mellitus with diabetic peripheral angiopathy without gangrene: Secondary | ICD-10-CM | POA: Diagnosis not present

## 2019-11-30 DIAGNOSIS — I10 Essential (primary) hypertension: Secondary | ICD-10-CM | POA: Insufficient documentation

## 2019-11-30 DIAGNOSIS — I2510 Atherosclerotic heart disease of native coronary artery without angina pectoris: Secondary | ICD-10-CM | POA: Diagnosis not present

## 2019-11-30 DIAGNOSIS — F17210 Nicotine dependence, cigarettes, uncomplicated: Secondary | ICD-10-CM | POA: Insufficient documentation

## 2019-11-30 DIAGNOSIS — J449 Chronic obstructive pulmonary disease, unspecified: Secondary | ICD-10-CM | POA: Diagnosis not present

## 2019-11-30 DIAGNOSIS — Z951 Presence of aortocoronary bypass graft: Secondary | ICD-10-CM | POA: Diagnosis not present

## 2019-11-30 NOTE — Progress Notes (Signed)
CLEAVON, GOLDMAN ([id number]) Visit Report for 11/30/2019 HPI Details Patient Name: Date of Service: SLAYTON, LUBITZ 11/30/2019 2:00 PM Medical Record [id number] Patient Account Number: [id number] Date of Birth/Sex: Treating RN: 19-Jan-1951 (69 y.o. [name] Primary Care Provider: Katy Apo Other Clinician: Referring Provider: Treating Provider/Extender:Areli Jowett, [name], [name]: 194 History of Present Illness HPI Description: 03/06/16; this is a patient who is a type II diabetic with a recent hemoglobin A1c of 7.6. He is referred from Dr. Allyson Sabal for aggressive wound care on a wound on his right anterior leg. He has known severe PAD and has an extensive interventional history. His left common iliac artery was stented in December 2003 and his right superficial femoral artery in April 2008. He has significant infrapopliteal disease bilaterally and has known occluded left SFA. His ABIs from September 2016 showed a ABI of 0.48 on the right and 0.52 on the left although he does not really describe claudication. He also has known carotid artery disease, coronary artery disease and is status post CABG in 2010. He tells me he smokes 3 cigarettes a day. The patient tells me his at the wound on the right anterior leg for about 6 months. He has a smaller area on the left medial leg that has been present for one month. He's been to see his primary physician Dr. polite and Dr. Allyson Sabal I am not clear what he is been using on this. It is felt that his ulcer is an ischemic ulcer on the pretibial region. It is noted that if we are unable to heal this it was felt he would need repeat angiography and potential percutaneous revascularization. 03/13/16; the area on the left medial leg is healed over. His ischemic wound on the right anterior leg is debridement of a lot of nonviable subcutaneous tissue we've been using Santyl 03/20/16; he has no open area on the left medial  leg. The wound on the right anterior leg looks ischemic. A lot of nonviable tissue. Our intake nurse reported that he had a lot of dried blood clots around the wound presumably from last week's debridement. There is no evidence of infection 04/16/16; the area on the right anterior leg is an ischemic wound. He is been back to see Dr. Allyson Sabal who states he is not a candidate for revascularization. He has a large area of occlusion from his groin down which cannot be revascularized. Since Dr. Allyson Sabal is aggressive with this type of situation I can only surmised that that's the correct decision 05/01/16 the area on the right leg is an ischemic wound. He has not a candidate for revascularization. He has not smoked in 48 hours. Has been using Santyl wound size is not any smaller however this surface looks somewhat better 05/08/16; ischemic wound on the right anterior leg. He is not a candidate for revascularization. He continues with nonsmoking for the last week. I use Prisma last week although that probably wasn't a good decision, covered in a difficult surface eschar today requiring debridement 05/15/16; patient with ischemic wound on his right anterior leg. He is not a candidate for revascularization. Complains of a lot of pain this week/burning over the wound area making it difficult for him to sleep 05/21/16; patient states less pain this week. The wound had circumferential fibrinous slough that I removed along with some nonviable subcutaneous tissue. We used Hydrofera Blue last week 05/28/16; patient states he has not had any pain. Some minor reduction in the area.  Still using Hydrofera Blue 06/04/16; no pain still using Hydrofera Blue 06/11/16 no complaints still using Hydrofera Blue 06/18/16; I changed him to Endoform last week. 06/25/16; wound still appears healthy. Minor debridement this week still using Endoform 07/02/16; the wound bed certainly appears healthier. Rim of epithelialization superiorly was  there last week. It certainly has not progressed any further. Using Endoform 07/09/16 patient is here for a review of the wound on the right anterior leg. He has a history of severe and prolonged PAD followed by Dr. Allyson Sabal in the setting of type 2 diabetes. I have been using Endoform for the last 3 or 4 weeks. He has a rim of epithelialization superiorly but I have not been able to advance this. He complains of pain but there is no clear infection 07/15/2016 -- the patient was here for a nurse visit but due to the presence of a new wound I was asked to see him for an opinion 07/23/16; the patient was seen last week by Dr. Meyer Russel in my absence. He had a new wound on his right dorsal ankle. Santyl was applied to this new area and also his original wound on the right anterior tibia. He arrives today complaining of a lot of pain 07/30/16; the patient has new wound on his right dorsal ankle from 2 weeks ago. This is as usual covered with a thick eschar. I think this is ischemic and I'm not expecting to be able to heal this. Santyl was applied for but denied by his insurance we will therefore use Medihoney. He only obtained this later today. He has left a wrap on all week with Santyl. 08/06/16 both wounds in the same situation still a gelatinous surface slough. He finds the Medihoney makes him feel pain. He could not afford this Santyl opiate 08/20/16; patient arrives today frustrated about his nonhealing wounds now 2 on his right leg. He arrived here with a wound on the right anterior leg mid aspect and is developed a second area on the right dorsal ankle. I have reviewed Dr. Hazle Coca notes and his visit from August/17. There he makes clear reference to the fact that the vessels were not revascularizable. The patient also describes fairly clear claudication at rest which makes him sleep with his legs in a dependent position. He has given up smoking as of yesterday 09/03/16; patient is going for a second  opinion on his vascular status of vein and vascular next week. We have been using Medihoney to the wounds. He is tolerating his discomfort. 09/17/16; the patient is going to have an angiogram on January 30. Miraculously I can't really see has had a recent angiogram although he has been to see Dr. Allyson Sabal who does not think here is cerebrovascular rise visible option. He does have stents in both legs that Dr. Allyson Sabal put and I think about 9 years ago. I would like to continue to use Medihoney until we see the results of his angiogram. 10/01/16; the patient had his angiogram. He has stent previously which was dilated and then a stent was placed in the right superficial femoral artery. He had failed balloon angioplasty of the right posterior tibial artery. A further stent was placed in the superficial femoral artery. An angioplasty was also done. He was noted to have severe stenosis versus occlusion of the posterior tibial artery which is the dominant vessel across the foot. Intervention was attempted here but was unsuccessful. If the patient is unable to heal his wounds it was felt he  could be brought back for repeat attempt 10/15/16- patient is here for follow-up evaluation of his right lower extremity and right dorsal foot ulcers. He was unable to make last week's appointment secondary to a death in the family. He has been using Iodosorb/Iodoflex dressing changes per home health. He states that his primary care physician follows an A1c every 3 months, with his last A1c being 7.8 which is elevated from the previous. He states he has a scheduled A1c for next month. He also states he has a follow-up appointment with Vein and Vascular in April 10/22/16- Mr. Hymes did have an xray of the right foot, revealing no definite evidence of bone destruction to suggest osteomyelitis. This was performed at [facility]. He has been wearing his show with a rolled tongue since the last visit when he leaves the home,  otherwise he wears loose house slippers around the house. He is voicing no complaints or concerns since his last visit. He admits to smoking "maybe one a week" 10/29/16- patient is here for follow-up evaluation of his right lower extremity and right dorsal foot ulcers. He states that he has completely stopped smoking but does admit to dipping tobacco. We have not found out his coverage for both [employer] and Apligraf. He continues to offload this area with his walking shoe otherwise wears a slipper at home. He brought this (and it does appear to allow for relief of pressure to the dorsal part of his foot. 11/05/16; patient is here for follow-up of his right lower extremity and right dorsal foot ulcers. He has been revascularized. He no longer smokes. We have been using Prisma. 11/12/16- Mr. Fulco arrives for follow up evaluation of his right lower extremity ulcers. We have been using Prisma. He is planned for an Apligraf application today. 11/19/16- Mr.Klug arrives for follow-up evaluation of his right lower extremity and right dorsal foot ulcer one week after Apligraf #1 application. He states the home health came out on Monday and change the foam, Kerlix and Coban and and they did not interrupt the Adaptic touch. He has a follow-up appointment with ultrasound on Monday with vein and vascular 11/26/16- he is here for follow-up evaluation. He is s/p Apligraf #1 application 2 weeks ago. he had an appointment with vascular on Monday for arterial ultrasound. In evaluation of the stent to the right SFA which indicates biphasic waveform proximal to the stent and at the origin of the stent otherwise monophasic flow throughout the stent and at the distal aspect of the stent. An ABI done in that office on Monday was 0.73 (previously 0.47). He has biphasic flow in the right posterior tibial and monophasic flow in the dorsalis pedis. He has a follow-up with Dr. Myra Gianotti on Monday, 4/9 12/03/16- He is here for  follow-up evaluation. He did see Dr. Myra Gianotti on Monday and has a scheduled aortogram with lower extremity arteriogram on the 17th. He was not compressed over the last week. His wound culture came back Oxacillin sensitive staph aureus. He was prescribed Keflex and has been taking that without any voiced complaints. He states that he started taking his Lasix, although he did not contact or have an appointment with Dr. Nehemiah Settle. He does have an appointment in May 12/10/16- he is here for follow-up evaluation. He had an intervention on 4/17 with Dr. Myra Gianotti. According to his dictation 1) the patient has a widely patent right SFA and popliteal stent 2) after the an 80% occlusion of the right PTA was treated with  atherectomy and balloon angioplasty with a residual stenosis of less than 5%. The patient is voicing significant improvement and overall feeling; states that he can now sleep in the bed without any symptoms. His completed his Keflex with no adverse reaction. He continues to self-regulate his Lasix, he is not on any potassium supplementation. 12/17/16; Arrives today with weeping edema thru the dorsal foot wound and lower leg. He has not been using his Lasix and did not have compression on his leg. According to the patient he has 40mg  Lasix at home which he takes prn but not in weeks 12/21/16; less edema and no weeping edema. Apligraf #1 12/31/16; both wounds look somewhat better especially the area distally. Apligraf #2 01/14/17; Apligraf #3. Wounds looked improved. Patient saw main and vascular. ABIs on 01/11/17 done in their office showed a patent SFA stent status post atherectomy with angioplasty of the right PTA [12/08/16] biphasic flow on the right 0.95 monophasic flow on the left 0.45. 01/28/17; Apligraf #4. Wounds not particularly better than I remember. 02/11/17; Apligraf #5 the wounds have some improvement. The distal 1 has had some epithelialization proximal one is somewhat smaller. 02/25/17; he  arrived in clinic today with a disappointing amount of surface necrotic material. This is especially true on the distal anterior foot/ankle wound. Change primary dressing the Hydrofera Blue today 03/04/17; some improvement in the proximal wound on the tibia. The distal wound on the dorsal foot/ankle has too much necrotic surface. We have been using Hydrofera Blue although there was apparently some problem with obtaining it through his home health company however the patient reports he now has it 03/11/17; wounds measure slightly smaller. Surface looks questionable although with the improvement in dimensions will likely continue with the [facility] without debridement today 03/18/17; no major change in these wounds. Questionable surface. Debridement today. Using [facility] 03/25/17; the patient has follow-up noninvasive vascular tests with vein and vascular later this month. The wounds himself are not measuring any different however the proximal wound bed looks better with the Iodoflex not much change with the area over the dorsal foot. 04/22/17; patient had repeat angiography on 04/20/17. He had a drug-coated balloon angioplasty of the right superficial femoral artery balloon angioplasty right common femoral artery. He had stenosis of previously placed stents within the right superficial femoral artery this was treated with drug coated balloon angioplasty. He continues to have single vessel runoff via the peroneal artery He has been using Hydrofera Blue 04/29/17; no major change this week. Wound beds appear healthy. No debridement. He apparently did not tolerate Iodoflex which I was using prior to modification. I'm going to change him to Endo form with hydrogel 05/13/17; wound beds again appear a lot better, paradoxically the wound on the dorsal foot appears to be progressing better than the proximal wound. Using Endoform with hydrogel 06/03/17; doing well. wounds smaller especially the wound on  the dorsal foot. using endoform 06/17/17; two-week follow-up everything is going really nicely here. Using Endoform he only has one small open area left on the right dorsal foot/ankle. Also improved on the right anterior leg. He is retired from smoking since starting with 06/19/17 in this clinic 07/01/17; two-week follow-up the area on his right anterior foot/ankle is just about closed. The area superiorly on the lower mid calf as adherent necrotic debris which will need debridement but it is also down in terms of dimensions. We'll use Endoform 07/19/17 on evaluation today patient appears to be doing well with his Current wound  care measures and there does not appear to be necrotic tissue which is good news. The Endoform however does seem to be causing some epithelialization although this is loose at the proximal/12 o'clock location and seems to be pulling off with dressing changes. No fevers, chills, nausea, or vomiting noted at this time. He has no significant discomfort. 08/05/17; the patient has 2 small open areas on the proximal foot and a larger wound on the right anterior leg. Change to [facility] Blue last time probably because of the hyper granulation proximally 08/26/17; the patient has 2 small open areas on the proximal foot wound which is closed. The other is covered with a thin surface eschar. There is a similar wound on the right anterior leg. We've been using Hydrofera Blue 09/09/17*; the patient went for angiogram with Dr. Trula Slade. Apparently a routine scheduled arterial duplex suggested occlusion of his stent however with the angiography the stent was widely patent. No procedure was necessary. He also apparently has had a long-standing pressure area on the left buttock which dates back to prior to Thanksgiving although he's never really said anything to anybody about this. He is been attempting to treated himself with triple antibiotic cream etc. We've been using silver collagen to the  wounds on his right leg and dorsal right foot. These are largely unchanged today 09/23/17; patient's wounds are smaller in both areas and are just about closed. We have been using silver collagen he shows me an area on his buttock that is in the gluteal fold. This would be an atypical place for a pressure sore like a herpetic lesion although he says it's been there for a long time 2/14/19the patient's wound is totally closed. The area on the dorsal foot is also close to closing. I looked again at the area on his left buttock in the gluteal fold. He tells me that this is been there for 6 months. This more looks more like a pressure ulcer to me than it did when I looked at this 2 weeks ago. I simply put topical antibiotics on this but we are going to have to change to a more formal dressing 10/13/17 on evaluation today patient appears to be doing very well in regard to his right dorsal foot wound. He has been tolerating the dressing changes without complication. Fortunately there does not appear to be any evidence of infection which is good news. He continues to have a left gluteal ulcer as well as he has been tolerating the Prisma dressing on although this is not significantly smaller it does sound like he's been putting other things on this as well including some kind of frame he in fact this order another cream as well although I'm not exactly sure what this is. It's something that he "researched on the Internet that is supposed to make My wound healed him seven days." At worst his pain is a 2/10 in regard to the left gluteal region but this is mainly with cleansing of the wound. 10/21/17; his right dorsal foot wound is just about closed. The area on the right anterior shin area is closed. He has a left gluteal ulcer which is probably a pressure ulcer. He is been using his own creams that he spot on the Internet. This wound is also better. 11/04/17; this is a patient with severe PAD who is been  revascularized by vein and vascular. He had 2 difficult wound areas one on the right anterior shin and the right anterior dorsal foot. More  recently he has developed a left gluteal ulcer which is probably a pressure ulcer although it has some atypical nature to it. His proximal right leg has remained healed. He has a small open area on the right dorsal foot with a nonviable surface 11/18/17; this is a patient with severe PAD who is been revascularized by vein and vascular. The area on the right proximal leg has healed over. He has a very small ischemic wound on the right dorsal foot which was the second wound. He also had a pressure ulcer on his buttock which is healed out as of today 12/02/17; wound on the dorsal foot which I think is largely ischemic. He is upset with me for doing a surface debridement on this last visit however he still has a nonviable surface on this and I think this is largely a blood flow issue 12/16/17; wound on the dorsal foot which I think is largely ischemic. He sees Dr. [place] Sink ham again next week. We've been using Iodoflex to this not much of a healthy surface. 12/30/17; the patient went back to see vascular surgery. He had noninvasive testing. His ABI on the right was 0.70 compared to 0.60 last time on the left 0.57 compared to 0.44. TBI's were 0.3 on the right 0.29 on the left essentially unchanged from last time. Waveforms were monophasic. He was seen by a provider. Noted nonpalpable pulses. Overall they felt that they had slightly improved bilateral ABIs and right TBI. Her disease bilaterally with all waveforms monophasic. They will follow his lower extremity arterial duplex in 6 months. They're calling the areas on his feet venous stasis ulcers. He does not clearly describe claudication 01/13/18; The patient's wound is slightly larger in width and nonviable surface. We've been using Iodoflex. He has home help changing the dressings. I have been reluctant to do  debridement on this since he blames a debridement for reopening his wound actually still think this is arterial even given recent arterial results and evaluation [see above) 01/27/18; using iodoflex. better surface. 02/10/18; using Iodoflex. Surface is some better. Changed him to silver collagen today 03/02/18 on evaluation today patient continues to show signs of slow but good progress in regard to his left lower extremity ulcer. He has been tolerating the dressing changes without complication which is good news. He does have somewhat limited/poor vascular flow but nonetheless he does seem to be healing just at a much slower place and what would be otherwise recommended and expected. I do believe the silver collagen dressing change has been of benefit. 03/22/18; slow but good progress in terms of his right anterior foot wound. These are largely ischemic wounds but he has been revascularized. He is continued with silver collagen 04/07/18 on evaluation today patient appears to be doing rather well in regard to the wound on the dorsal surface of his right foot. He's been tolerating the dressing changes without complication. Fortunately there does not appear to be the evidence of infection at this time which is great news. Overall I'm very pleased with how things seem to appear at this point. 04/21/18; dorsal right foot wound secondary to PAD. Wound is making progress by 2 mm. Surface of it is passable. He is been using silver collagen 05/05/18; we received a call from home health this week that the original proximal tibial area had reopened on the right. He now has 2 open areas including the one we've been watching for a long period on the foot. The new open area seems  to be right in the same place as the original wound previously was located 05/13/2018; wounds are both actually larger. I debrided these last week both wounds are bigger. The surface looks better I been using Iodoflex. 05/27/2018; the wounds  are larger but the surface of them looks better with Iodoflex. He has a follow-up with Dr. Trula Slade sometime in November. I tried to move these up we were not able to get an earlier appointment I will try to text him. 06/16/2018; the patient's wounds are larger but with a generally cleaner surface. He has extensive set of investigations with Dr. Trula Slade on November 5. This includes ABIs TBI's and I believe an ultrasound of the stent. 07/04/2018; the patient apparently stated he felt too dizzy to go for his arterial studies on 11/5 therefore he canceled him. He has not been able to rebook them. I suspect these are ischemic wounds and I told him this. 09/01/2018; he is not been here in almost 2 months. He has been getting his dressing changed by [facility] I believe silver collagen. They are discharging him next week due to nonimprovement of the wounds predominantly. He did not follow-up with vascular surgery he was supposed to have follow-up arterial studies noninvasive. I am not sure that they have been rebooked and I do not think he has been seen. 1/23; patient's wounds are slightly larger. I believe this is ischemic. He canceled his last follow-up appointment with Dr. Trula Slade him that included noninvasive arterial studies. He says this was because of vertigo. He has not rescheduled these. He also has lost home health because of the chronicity of his wounds 2/6; the patient's wound on his right anterior tibia actually looks quite good. Not much difference in size but the surface of the wound looks good. He has a small area on the dorsal foot debris on the surface of this which I debrided with Anasept and gauze. He has not made a follow-up with vein and vascular 2/20; the patient's wound on his right anterior tibia continues to look good although it is not coming down in size much. The area on his right dorsal foot is about the same. Tightly adherent debris. The wound surface does not look healthy  care. The patient is expressed his to sustain for mechanical debridement in the past, I therefore been avoiding this at least until he goes back for repeat vascular appointment. So far he has not done this he is mingling for ENT appointments related to vertigo 3/5; right anterior tibia continues to look satisfactory perhaps slightly smaller. The area on the right dorsal foot is smaller. We have been using Hydrofera Blue. 4/16; the patient wounds have actually done well in the hiatus since we have last seen him. He is using Hydrofera Blue ready 6/25; we have not seen this patient in over 2 months as he has been in the isolation phase for the Covid epidemic. He is using Hydrofera Blue ready. He has 2 wounds right on the right anterior mid tibia and one on the right dorsal foot. Both of these are somewhat improved today. Especially the dorsal 1 over the tibia. He has not been for follow- up arterial studies but I have got him to agree to allow [place] to set these up. I 7/16; the patient's wound proximally in the mid tibia area has closed over once again. We had this closed over before and he reopened the area on the dorsal foot appears better. He has been using Hydrofera Blue. We  do not have an appointment for the follow-up arterial studies I ordered nor do I think he is really all that interested in getting them done. 7/30; patient has been using Hydrofera Blue. The dorsal foot wound looks some better however he has had a reopening in the mid tibia area. This is been closed over the last time. The opening is very superficial. We have been trying to get him follow-up vascular studies done with vein and vascular for about a year. He refused to do these at one point because he was at work getting worked up for vertigo. He is now more ready to move forward with this. 8/20; patient is using Hydrofera Blue. He has not made an appointment for his follow-up noninvasive vascular studies and I have talked to him  about this again. Both wound areas are still open on the right mid tibia and the right dorsal foot although I think they are smaller 05/04/19-Patient is back after his vascular visit his studies revealed that he is got ABI of 0.6 on the right, 0.46 on the left, monophasic flow to both right and left anterior tibial arteries is got great pressures to the right great toe. 10/8; we still do not have a vascular surgery appointment 1 month later. I think he may require a repeat angiogram his wounds on the right anterior to mid tibia is quite a bit larger the area on the right dorsal ankle, is small. Very little open area here 11/12; patient has not been here in over a month. He still did not have a vascular surgery appointment however before he left the clinic today we managed to arrange that for 12/9. The area on his right dorsal foot is surprisingly closed over today however the area in the mid tibia is quite a bit larger than when we saw this last time. 12/3; patient has a vascular appointment next week in follow-up from his previous revascularization. The area on the right dorsal foot remains closed. He still has a fairly sizable wound on the anterior tibia but it is superficial. We have been using silver collagen 12/28. The patient saw Langston Reusing the nurse practitioner at [facility]. Noted that the most recent ABIs were in September 2020 which showed a mild decline bilaterally a 0.61 on the right with monophasic waveforms and 0.46 on the left with monophasic waveforms. It was felt that the area on his right lower leg was a venous ulcer. The area on the dorsal foot remains closed. Follow-up ABIs were ordered for 6 months. We have been ordering silver collagen 1/28; monthly follow-up. Not much change in the wound on the anterior mid tibia. We have been using silver collagen for a prolonged period of time now. He has been reviewed by vascular with regards to his known PAD. They did not  think that he needed any further interventions for now and ordered follow-up ABIs in 6 months. We did manage to heal the area on the dorsal foot and that remains closed 2/25; monthly follow-up. The wound on the right anterior mid tibia is better. We changed to polymen last time there is less surrounding maceration and the wound bed looks healthy. 3/18; monthly follow-up. The wound on the right anterior mid tibia is better smaller we've been using PolyMem 4/8; patient with a wound on the right anterior tibia area. We have been using polymen. This is very close to being healed Electronic Signature(s) Signed: 11/30/2019 5:58:56 PM By: Baltazar Najjar MD Entered By: Leanord Hawking,  Aralynn Brake on 11/30/2019 15:39:20 -------------------------------------------------------------------------------- Physical Exam Details Patient Name: Date of Service: DIVONTE, SENGER 11/30/2019 2:00 PM Medical Record [id number] Patient Account Number: [id number] Date of Birth/Sex: Treating RN: 26-Mar-1951 (69 y.o. [name] Primary Care Provider: Katy Apo Other Clinician: Referring Provider: Treating Provider/Extender:Glennda Weatherholtz, [name], [name]: 194 Notes Wound exam; very faint pedal pulses however the remaining wound on the right anterior mid tibia is just about closed small oval-shaped wound room remains open. Washed with wound cleanser and gauze no need for mechanical debridement Electronic Signature(s) Signed: 11/30/2019 5:58:56 PM By: Baltazar Najjar MD Entered By: Baltazar Najjar on 11/30/2019 15:39:51 -------------------------------------------------------------------------------- Physician Orders Details Patient Name: Date of Service: [name] 11/30/2019 2:00 PM Medical Record [id number] Patient Account Number: [id number] Date of Birth/Sex: Treating RN: [birth year] (69 y.o. [name] Primary Care Provider: Katy Apo Other Clinician: Referring  Provider: Treating Provider/Extender:Martin Smeal, [name], [name]: 194 Verbal / Phone Orders: No Diagnosis Coding ICD-10 Coding Code Description [phone] Non-pressure chronic ulcer of right calf with necrosis of muscle E11.622 Type 2 diabetes mellitus with other skin ulcer E11.621 Type 2 diabetes mellitus with foot ulcer E11.51 Type 2 diabetes mellitus with diabetic peripheral angiopathy without gangrene Follow-up Appointments Return appointment in 3 weeks. Dressing Change Frequency Wound #5R Right,Anterior Lower Leg Other: - twice a week Skin Barriers/Peri-Wound Care Barrier cream - apply zinc oxide around the wound bed for wetness, reddness, or maceration. Moisturizing lotion - to leg Wound Cleansing Wound #5R Right,Anterior Lower Leg May shower with protection. Primary Wound Dressing Wound #5R Right,Anterior Lower Leg Polymem Secondary Dressing Wound #5R Right,Anterior Lower Leg Foam Border - or bordered gauze. Edema Control Avoid standing for long periods of time Elevate legs to the level of the heart or above for 30 minutes daily and/or when sitting, a frequency of: - throughout the day Other: - patient to purchase support stockings for protection. Off-Loading Turn and reposition every 2 hours Electronic Signature(s) Signed: 11/30/2019 5:58:56 PM By: Baltazar Najjar MD Signed: 11/30/2019 6:08:37 PM By: Shawn Stall Entered By: Shawn Stall on 11/30/2019 14:57:43 -------------------------------------------------------------------------------- Problem List Details Patient Name: Date of Service: [name] 11/30/2019 2:00 PM Medical Record [id number] Patient Account Number: [id number] Date of Birth/Sex: Treating RN: 1950/12/28 (69 y.o. [name] Primary Care Provider: Katy Apo Other Clinician: Referring Provider: Treating Provider/Extender:Misao Fackrell, [name], [name]: 194 Active  Problems ICD-10 Evaluated Encounter Code Description Active Date Today Diagnosis L97.213 Non-pressure chronic ulcer of right calf with necrosis 03/06/2016 No Yes of muscle E11.622 Type 2 diabetes mellitus with other skin ulcer 03/06/2016 No Yes E11.51 Type 2 diabetes mellitus with diabetic peripheral 03/06/2016 No Yes angiopathy without gangrene Inactive Problems ICD-10 Code Description Active Date Inactive Date L97.512 Non-pressure chronic ulcer of other part of right foot with fat 03/03/2018 03/03/2018 layer exposed E11.621 Type 2 diabetes mellitus with foot ulcer 11/19/2016 11/19/2016 Resolved Problems ICD-10 Code Description Active Date Resolved Date L97.221 Non-pressure chronic ulcer of left calf limited to breakdown of 03/06/2016 03/06/2016 skin L97.312 Non-pressure chronic ulcer of right ankle with fat layer 07/15/2016 07/15/2016 exposed L89.322 Pressure ulcer of left buttock, stage 2 09/09/2017 09/09/2017 Electronic Signature(s) Signed: 11/30/2019 5:58:56 PM By: Baltazar Najjar MD Entered By: Baltazar Najjar on 11/30/2019 15:38:36 -------------------------------------------------------------------------------- Progress Note Details Patient Name: Date of Service: [name] 11/30/2019 2:00 PM Medical Record [id number] Patient Account Number: [id number] Date of Birth/Sex: Treating RN: Apr 11, 1951 (69 y.o. M) Elesa Hacker,  ZOXWR Primary Care Provider: Katy Apo Other Clinician: Referring Provider: Treating Provider/Extender:Amellia Panik, [name], [name]: 194 Subjective History of Present Illness (HPI) 03/06/16; this is a patient who is a type II diabetic with a recent hemoglobin A1c of 7.6. He is referred from Dr. Allyson Sabal for aggressive wound care on a wound on his right anterior leg. He has known severe PAD and has an extensive interventional history. His left common iliac artery was stented in December 2003 and his right superficial femoral artery in  April 2008. He has significant infrapopliteal disease bilaterally and has known occluded left SFA. His ABIs from September 2016 showed a ABI of 0.48 on the right and 0.52 on the left although he does not really describe claudication. He also has known carotid artery disease, coronary artery disease and is status post CABG in 2010. He tells me he smokes 3 cigarettes a day. The patient tells me his at the wound on the right anterior leg for about 6 months. He has a smaller area on the left medial leg that has been present for one month. He's been to see his primary physician Dr. polite and Dr. Allyson Sabal I am not clear what he is been using on this. It is felt that his ulcer is an ischemic ulcer on the pretibial region. It is noted that if we are unable to heal this it was felt he would need repeat angiography and potential percutaneous revascularization. 03/13/16; the area on the left medial leg is healed over. His ischemic wound on the right anterior leg is debridement of a lot of nonviable subcutaneous tissue we've been using Santyl 03/20/16; he has no open area on the left medial leg. The wound on the right anterior leg looks ischemic. A lot of nonviable tissue. Our intake nurse reported that he had a lot of dried blood clots around the wound presumably from last week's debridement. There is no evidence of infection 04/16/16; the area on the right anterior leg is an ischemic wound. He is been back to see Dr. Allyson Sabal who states he is not a candidate for revascularization. He has a large area of occlusion from his groin down which cannot be revascularized. Since Dr. Allyson Sabal is aggressive with this type of situation I can only surmised that that's the correct decision 05/01/16 the area on the right leg is an ischemic wound. He has not a candidate for revascularization. He has not smoked in 48 hours. Has been using Santyl wound size is not any smaller however this surface looks somewhat better 05/08/16; ischemic  wound on the right anterior leg. He is not a candidate for revascularization. He continues with nonsmoking for the last week. I use Prisma last week although that probably wasn't a good decision, covered in a difficult surface eschar today requiring debridement 05/15/16; patient with ischemic wound on his right anterior leg. He is not a candidate for revascularization. Complains of a lot of pain this week/burning over the wound area making it difficult for him to sleep 05/21/16; patient states less pain this week. The wound had circumferential fibrinous slough that I removed along with some nonviable subcutaneous tissue. We used Hydrofera Blue last week 05/28/16; patient states he has not had any pain. Some minor reduction in the area. Still using Hydrofera Blue 06/04/16; no pain still using Hydrofera Blue 06/11/16 no complaints still using Hydrofera Blue 06/18/16; I changed him to Endoform last week. 06/25/16; wound still appears healthy. Minor debridement this week still using  Endoform 07/02/16; the wound bed certainly appears healthier. Rim of epithelialization superiorly was there last week. It certainly has not progressed any further. Using Endoform 07/09/16 patient is here for a review of the wound on the right anterior leg. He has a history of severe and prolonged PAD followed by Dr. Allyson Sabal in the setting of type 2 diabetes. I have been using Endoform for the last 3 or 4 weeks. He has a rim of epithelialization superiorly but I have not been able to advance this. He complains of pain but there is no clear infection 07/15/2016 -- the patient was here for a nurse visit but due to the presence of a new wound I was asked to see him for an opinion 07/23/16; the patient was seen last week by Dr. Meyer Russel in my absence. He had a new wound on his right dorsal ankle. Santyl was applied to this new area and also his original wound on the right anterior tibia. He arrives today complaining of a lot of  pain 07/30/16; the patient has new wound on his right dorsal ankle from 2 weeks ago. This is as usual covered with a thick eschar. I think this is ischemic and I'm not expecting to be able to heal this. Santyl was applied for but denied by his insurance we will therefore use Medihoney. He only obtained this later today. He has left a wrap on all week with Santyl. 08/06/16 both wounds in the same situation still a gelatinous surface slough. He finds the Medihoney makes him feel pain. He could not afford this Santyl opiate 08/20/16; patient arrives today frustrated about his nonhealing wounds now o2 on his right leg. He arrived here with a wound on the right anterior leg mid aspect and is developed a second area on the right dorsal ankle. I have reviewed Dr. Hazle Coca notes and his visit from August/17. There he makes clear reference to the fact that the vessels were not revascularizable. The patient also describes fairly clear claudication at rest which makes him sleep with his legs in a dependent position. He has given up smoking as of yesterday 09/03/16; patient is going for a second opinion on his vascular status of [facility] next week. We have been using Medihoney to the wounds. He is tolerating his discomfort. 09/17/16; the patient is going to have an angiogram on January 30. Miraculously I can't really see has had a recent angiogram although he has been to see Dr. Allyson Sabal who does not think here is cerebrovascular rise visible option. He does have stents in both legs that Dr. Allyson Sabal put and I think about 9 years ago. I would like to continue to use Medihoney until we see the results of his angiogram. 10/01/16; the patient had his angiogram. He has stent previously which was dilated and then a stent was placed in the right superficial femoral artery. He had failed balloon angioplasty of the right posterior tibial artery. A further stent was placed in the superficial femoral artery. An  angioplasty was also done. He was noted to have severe stenosis versus occlusion of the posterior tibial artery which is the dominant vessel across the foot. Intervention was attempted here but was unsuccessful. If the patient is unable to heal his wounds it was felt he could be brought back for repeat attempt 10/15/16- patient is here for follow-up evaluation of his right lower extremity and right dorsal foot ulcers. He was unable to make last week's appointment secondary to a death in  the family. He has been using Iodosorb/Iodoflex dressing changes per home health. He states that his primary care physician follows an A1c every 3 months, with his last A1c being 7.8 which is elevated from the previous. He states he has a scheduled A1c for next month. He also states he has a follow-up appointment with [facility] in April 10/22/16- Mr. Cozort did have an xray of the right foot, revealing no definite evidence of bone destruction to suggest osteomyelitis. This was performed at [facility]. He has been wearing his show with a rolled tongue since the last visit when he leaves the home, otherwise he wears loose house slippers around the house. He is voicing no complaints or concerns since his last visit. He admits to smoking "maybe one a week" 10/29/16- patient is here for follow-up evaluation of his right lower extremity and right dorsal foot ulcers. He states that he has completely stopped smoking but does admit to dipping tobacco. We have not found out his coverage for both [employer] and Apligraf. He continues to offload this area with his walking shoe otherwise wears a slipper at home. He brought this (and it does appear to allow for relief of pressure to the dorsal part of his foot. 11/05/16; patient is here for follow-up of his right lower extremity and right dorsal foot ulcers. He has been revascularized. He no longer smokes. We have been using Prisma. 11/12/16- Mr. Bewley arrives for follow up  evaluation of his right lower extremity ulcers. We have been using Prisma. He is planned for an Apligraf application today. 11/19/16- Mr.Sarra arrives for follow-up evaluation of his right lower extremity and right dorsal foot ulcer one week after Apligraf #1 application. He states the home health came out on Monday and change the foam, Kerlix and Coban and and they did not interrupt the Adaptic touch. He has a follow-up appointment with ultrasound on Monday with vein and vascular 11/26/16- he is here for follow-up evaluation. He is s/p Apligraf #1 application 2 weeks ago. he had an appointment with vascular on Monday for arterial ultrasound. In evaluation of the stent to the right SFA which indicates biphasic waveform proximal to the stent and at the origin of the stent otherwise monophasic flow throughout the stent and at the distal aspect of the stent. An ABI done in that office on Monday was 0.73 (previously 0.47). He has biphasic flow in the right posterior tibial and monophasic flow in the dorsalis pedis. He has a follow-up with Dr. Myra Gianotti on Monday, 4/9 12/03/16- He is here for follow-up evaluation. He did see Dr. Myra Gianotti on Monday and has a scheduled aortogram with lower extremity arteriogram on the 17th. He was not compressed over the last week. His wound culture came back Oxacillin sensitive staph aureus. He was prescribed Keflex and has been taking that without any voiced complaints. He states that he started taking his Lasix, although he did not contact or have an appointment with Dr. Nehemiah Settle. He does have an appointment in May 12/10/16- he is here for follow-up evaluation. He had an intervention on 4/17 with Dr. Myra Gianotti. According to his dictation 1) the patient has a widely patent right SFA and popliteal stent 2) after the an 80% occlusion of the right PTA was treated with atherectomy and balloon angioplasty with a residual stenosis of less than 5%. The patient is voicing significant  improvement and overall feeling; states that he can now sleep in the bed without any symptoms. His completed his Keflex  with no adverse reaction. He continues to self-regulate his Lasix, he is not on any potassium supplementation. 12/17/16; Arrives today with weeping edema thru the dorsal foot wound and lower leg. He has not been using his Lasix and did not have compression on his leg. According to the patient he has 40mg  Lasix at home which he takes prn but not in weeks 12/21/16; less edema and no weeping edema. Apligraf #1 12/31/16; both wounds look somewhat better especially the area distally. Apligraf #2 01/14/17; Apligraf #3. Wounds looked improved. Patient saw [facility] vascular. ABIs on 01/11/17 done in their office showed a patent SFA stent status post atherectomy with angioplasty of the right PTA [12/08/16] biphasic flow on the right 0.95 monophasic flow on the left 0.45. 01/28/17; Apligraf #4. Wounds not particularly better than I remember. 02/11/17; Apligraf #5 the wounds have some improvement. The distal 1 has had some epithelialization proximal one is somewhat smaller. 02/25/17; he arrived in clinic today with a disappointing amount of surface necrotic material. This is especially true on the distal anterior foot/ankle wound. Change primary dressing the Hydrofera Blue today 03/04/17; some improvement in the proximal wound on the tibia. The distal wound on the dorsal foot/ankle has too much necrotic surface. We have been using Hydrofera Blue although there was apparently some problem with obtaining it through his home health company however the patient reports he now has it 03/11/17; wounds measure slightly smaller. Surface looks questionable although with the improvement in dimensions will likely continue with the [facility] without debridement today 03/18/17; no major change in these wounds. Questionable surface. Debridement today. Using [facility] 03/25/17; the patient has follow-up  noninvasive vascular tests with [facility] vascular later this month. The wounds himself are not measuring any different however the proximal wound bed looks better with the Iodoflex not much change with the area over the dorsal foot. 04/22/17; patient had repeat angiography on 04/20/17. He had a drug-coated balloon angioplasty of the right superficial femoral artery balloon angioplasty right common femoral artery. He had stenosis of previously placed stents within the right superficial femoral artery this was treated with drug coated balloon angioplasty. He continues to have single vessel runoff via the peroneal artery He has been using Hydrofera Blue 04/29/17; no major change this week. Wound beds appear healthy. No debridement. He apparently did not tolerate Iodoflex which I was using prior to modification. I'm going to change him to Endo form with hydrogel 05/13/17; wound beds again appear a lot better, paradoxically the wound on the dorsal foot appears to be progressing better than the proximal wound. Using Endoform with hydrogel 06/03/17; doing well. wounds smaller especially the wound on the dorsal foot. using endoform 06/17/17; two-week follow-up everything is going really nicely here. Using Endoform he only has one small open area left on the right dorsal foot/ankle. Also improved on the right anterior leg. He is retired from smoking since starting with [place] in this clinic 07/01/17; two-week follow-up the area on his right anterior foot/ankle is just about closed. The area superiorly on the lower mid calf as adherent necrotic debris which will need debridement but it is also down in terms of dimensions. We'll use Endoform 07/19/17 on evaluation today patient appears to be doing well with his Current wound care measures and there does not appear to be necrotic tissue which is good news. The Endoform however does seem to be causing some epithelialization although this is loose at the proximal/12  o'clock location and seems to  be pulling off with dressing changes. No fevers, chills, nausea, or vomiting noted at this time. He has no significant discomfort. 08/05/17; the patient has 2 small open areas on the proximal foot and a larger wound on the right anterior leg. Change to [facility] Blue last time probably because of the hyper granulation proximally 08/26/17; the patient has 2 small open areas on the proximal foot wound which is closed. The other is covered with a thin surface eschar. There is a similar wound on the right anterior leg. We've been using Hydrofera Blue 09/09/17*; the patient went for angiogram with Dr. Myra Gianotti. Apparently a routine scheduled arterial duplex suggested occlusion of his stent however with the angiography the stent was widely patent. No procedure was necessary. He also apparently has had a long-standing pressure area on the left buttock which dates back to prior to Thanksgiving although he's never really said anything to anybody about this. He is been attempting to treated himself with triple antibiotic cream etc. We've been using silver collagen to the wounds on his right leg and dorsal right foot. These are largely unchanged today 09/23/17; patient's wounds are smaller in both areas and are just about closed. We have been using silver collagen he shows me an area on his buttock that is in the gluteal fold. This would be an atypical place for a pressure sore like a herpetic lesion although he says it's been there for a long time 2/14/19the patient's wound is totally closed. The area on the dorsal foot is also close to closing. I looked again at the area on his left buttock in the gluteal fold. He tells me that this is been there for 6 months. This more looks more like a pressure ulcer to me than it did when I looked at this 2 weeks ago. I simply put topical antibiotics on this but we are going to have to change to a more formal dressing 10/13/17 on evaluation  today patient appears to be doing very well in regard to his right dorsal foot wound. He has been tolerating the dressing changes without complication. Fortunately there does not appear to be any evidence of infection which is good news. He continues to have a left gluteal ulcer as well as he has been tolerating the Prisma dressing on although this is not significantly smaller it does sound like he's been putting other things on this as well including some kind of frame he in fact this order another cream as well although I'm not exactly sure what this is. It's something that he "researched on the Internet that is supposed to make My wound healed him seven days." At worst his pain is a 2/10 in regard to the left gluteal region but this is mainly with cleansing of the wound. 10/21/17; his right dorsal foot wound is just about closed. The area on the right anterior shin area is closed. He has a left gluteal ulcer which is probably a pressure ulcer. He is been using his own creams that he spot on the Internet. This wound is also better. 11/04/17; this is a patient with severe PAD who is been revascularized by [facility]. He had 2 difficult wound areas one on the right anterior shin and the right anterior dorsal foot. More recently he has developed a left gluteal ulcer which is probably a pressure ulcer although it has some atypical nature to it. His proximal right leg has remained healed. He has a small open area on the right  dorsal foot with a nonviable surface 11/18/17; this is a patient with severe PAD who is been revascularized by [facility]. The area on the right proximal leg has healed over. He has a very small ischemic wound on the right dorsal foot which was the second wound. He also had a pressure ulcer on his buttock which is healed out as of today 12/02/17; wound on the dorsal foot which I think is largely ischemic. He is upset with me for doing a surface debridement on this last  visit however he still has a nonviable surface on this and I think this is largely a blood flow issue 12/16/17; wound on the dorsal foot which I think is largely ischemic. He sees Dr. [facility] SinkBraden ham again next week. We've been using Iodoflex to this not much of a healthy surface. 12/30/17; the patient went back to see vascular surgery. He had noninvasive testing. His ABI on the right was 0.70 compared to 0.60 last time on the left 0.57 compared to 0.44. TBI's were 0.3 on the right 0.29 on the left essentially unchanged from last time. Waveforms were monophasic. He was seen by a provider. Noted nonpalpable pulses. Overall they felt that they had slightly improved bilateral ABIs and right TBI. Her disease bilaterally with all waveforms monophasic. They will follow his lower extremity arterial duplex in 6 months. They're calling the areas on his feet venous stasis ulcers. He does not clearly describe claudication 01/13/18; The patient's wound is slightly larger in width and nonviable surface. We've been using Iodoflex. He has home help changing the dressings. I have been reluctant to do debridement on this since he blames a debridement for reopening his wound actually still think this is arterial even given recent arterial results and evaluation [see above) 01/27/18; using iodoflex. better surface. 02/10/18; using Iodoflex. Surface is some better. Changed him to silver collagen today 03/02/18 on evaluation today patient continues to show signs of slow but good progress in regard to his left lower extremity ulcer. He has been tolerating the dressing changes without complication which is good news. He does have somewhat limited/poor vascular flow but nonetheless he does seem to be healing just at a much slower place and what would be otherwise recommended and expected. I do believe the silver collagen dressing change has been of benefit. 03/22/18; slow but good progress in terms of his right anterior foot wound.  These are largely ischemic wounds but he has been revascularized. He is continued with silver collagen 04/07/18 on evaluation today patient appears to be doing rather well in regard to the wound on the dorsal surface of his right foot. He's been tolerating the dressing changes without complication. Fortunately there does not appear to be the evidence of infection at this time which is great news. Overall I'm very pleased with how things seem to appear at this point. 04/21/18; dorsal right foot wound secondary to PAD. Wound is making progress by 2 mm. Surface of it is passable. He is been using silver collagen 05/05/18; we received a call from home health this week that the original proximal tibial area had reopened on the right. He now has 2 open areas including the one we've been watching for a long period on the foot. The new open area seems to be right in the same place as the original wound previously was located 05/13/2018; wounds are both actually larger. I debrided these last week both wounds are bigger. The surface looks better I been using Iodoflex.  05/27/2018; the wounds are larger but the surface of them looks better with Iodoflex. He has a follow-up with Dr. Myra Gianotti sometime in November. I tried to move these up we were not able to get an earlier appointment I will try to text him. 06/16/2018; the patient's wounds are larger but with a generally cleaner surface. He has extensive set of investigations with Dr. Myra Gianotti on November 5. This includes ABIs TBI's and I believe an ultrasound of the stent. 07/04/2018; the patient apparently stated he felt too dizzy to go for his arterial studies on 11/5 therefore he canceled him. He has not been able to rebook them. I suspect these are ischemic wounds and I told him this. 09/01/2018; he is not been here in almost 2 months. He has been getting his dressing changed by [facility] I believe silver collagen. They are discharging him next week due to  nonimprovement of the wounds predominantly. He did not follow-up with vascular surgery he was supposed to have follow-up arterial studies noninvasive. I am not sure that they have been rebooked and I do not think he has been seen. 1/23; patient's wounds are slightly larger. I believe this is ischemic. He canceled his last follow-up appointment with Dr. Myra Gianotti him that included noninvasive arterial studies. He says this was because of vertigo. He has not rescheduled these. He also has lost home health because of the chronicity of his wounds 2/6; the patient's wound on his right anterior tibia actually looks quite good. Not much difference in size but the surface of the wound looks good. He has a small area on the dorsal foot debris on the surface of this which I debrided with Anasept and gauze. He has not made a follow-up with [facility] 2/20; the patient's wound on his right anterior tibia continues to look good although it is not coming down in size much. The area on his right dorsal foot is about the same. Tightly adherent debris. The wound surface does not look healthy care. The patient is expressed his to sustain for mechanical debridement in the past, I therefore been avoiding this at least until he goes back for repeat vascular appointment. So far he has not done this he is mingling for ENT appointments related to vertigo 3/5; right anterior tibia continues to look satisfactory perhaps slightly smaller. The area on the right dorsal foot is smaller. We have been using Hydrofera Blue. 4/16; the patient wounds have actually done well in the hiatus since we have last seen him. He is using Hydrofera Blue ready 6/25; we have not seen this patient in over 2 months as he has been in the isolation phase for the Covid epidemic. He is using Hydrofera Blue ready. He has 2 wounds right on the right anterior mid tibia and one on the right dorsal foot. Both of these are somewhat improved today.  Especially the dorsal 1 over the tibia. He has not been for follow- up arterial studies but I have got him to agree to allow [place] to set these up. I 7/16; the patient's wound proximally in the mid tibia area has closed over once again. We had this closed over before and he reopened the area on the dorsal foot appears better. He has been using Hydrofera Blue. We do not have an appointment for the follow-up arterial studies I ordered nor do I think he is really all that interested in getting them done. 7/30; patient has been using Hydrofera Blue. The dorsal foot wound  looks some better however he has had a reopening in the mid tibia area. This is been closed over the last time. The opening is very superficial. We have been trying to get him follow-up vascular studies done with [facility] for about a year. He refused to do these at one point because he was at work getting worked up for vertigo. He is now more ready to move forward with this. 8/20; patient is using Hydrofera Blue. He has not made an appointment for his follow-up noninvasive vascular studies and I have talked to him about this again. Both wound areas are still open on the right mid tibia and the right dorsal foot although I think they are smaller 05/04/19-Patient is back after his vascular visit his studies revealed that he is got ABI of 0.6 on the right, 0.46 on the left, monophasic flow to both right and left anterior tibial arteries is got great pressures to the right great toe. 10/8; we still do not have a vascular surgery appointment 1 month later. I think he may require a repeat angiogram his wounds on the right anterior to mid tibia is quite a bit larger the area on the right dorsal ankle, is small. Very little open area here 11/12; patient has not been here in over a month. He still did not have a vascular surgery appointment however before he left the clinic today we managed to arrange that for 12/9. The area on his right  dorsal foot is surprisingly closed over today however the area in the mid tibia is quite a bit larger than when we saw this last time. 12/3; patient has a vascular appointment next week in follow-up from his previous revascularization. The area on the right dorsal foot remains closed. He still has a fairly sizable wound on the anterior tibia but it is superficial. We have been using silver collagen 12/28. The patient saw Langston Reusing the nurse practitioner at [facility]. Noted that the most recent ABIs were in September 2020 which showed a mild decline bilaterally a 0.61 on the right with monophasic waveforms and 0.46 on the left with monophasic waveforms. It was felt that the area on his right lower leg was a venous ulcer. The area on the dorsal foot remains closed. Follow-up ABIs were ordered for 6 months. We have been ordering silver collagen 1/28; monthly follow-up. Not much change in the wound on the anterior mid tibia. We have been using silver collagen for a prolonged period of time now. He has been reviewed by vascular with regards to his known PAD. They did not think that he needed any further interventions for now and ordered follow-up ABIs in 6 months. We did manage to heal the area on the dorsal foot and that remains closed 2/25; monthly follow-up. The wound on the right anterior mid tibia is better. We changed to polymen last time there is less surrounding maceration and the wound bed looks healthy. 3/18; monthly follow-up. The wound on the right anterior mid tibia is better smaller we've been using PolyMem 4/8; patient with a wound on the right anterior tibia area. We have been using polymen. This is very close to being healed Objective Constitutional Vitals Time Taken: 2:19 PM, Height: 61 in, Weight: 122 lbs, BMI: 23, Temperature: 98.2 F, Pulse: 87 bpm, Respiratory Rate: 19 breaths/min, Blood Pressure: 171/73 mmHg. Integumentary (Hair, Skin) Wound #5R status is Open.  Original cause of wound was Gradually Appeared. The wound is located on the  Right,Anterior Lower Leg. The wound measures 0.3cm length x 0.3cm width x 0.1cm depth; 0.071cm^2 area and 0.007cm^3 volume. There is Fat Layer (Subcutaneous Tissue) Exposed exposed. There is no tunneling or undermining noted. There is a medium amount of serosanguineous drainage noted. The wound margin is flat and intact. There is large (67-100%) red granulation within the wound bed. There is no necrotic tissue within the wound bed. Assessment Active Problems ICD-10 Non-pressure chronic ulcer of right calf with necrosis of muscle Type 2 diabetes mellitus with other skin ulcer Type 2 diabetes mellitus with diabetic peripheral angiopathy without gangrene Plan Follow-up Appointments: Return appointment in 3 weeks. Dressing Change Frequency: Wound #5R Right,Anterior Lower Leg: Other: - twice a week Skin Barriers/Peri-Wound Care: Barrier cream - apply zinc oxide around the wound bed for wetness, reddness, or maceration. Moisturizing lotion - to leg Wound Cleansing: Wound #5R Right,Anterior Lower Leg: May shower with protection. Primary Wound Dressing: Wound #5R Right,Anterior Lower Leg: Polymem Secondary Dressing: Wound #5R Right,Anterior Lower Leg: Foam Border - or bordered gauze. Edema Control: Avoid standing for long periods of time Elevate legs to the level of the heart or above for 30 minutes daily and/or when sitting, a frequency of: - throughout the day Other: - patient to purchase support stockings for protection. Off-Loading: Turn and reposition every 2 hours 1. Polymen with bordered gauze 2. Follow-up in 3 to 4 weeks may be healed by that point Electronic Signature(s) Signed: 11/30/2019 5:58:56 PM By: Baltazar Najjar MD Entered By: Baltazar Najjar on 11/30/2019 15:40:42 -------------------------------------------------------------------------------- SuperBill Details Patient Name: Date of  Service: [name] 11/30/2019 Medical Record [id number] Patient Account Number: [id number] Date of Birth/Sex: Treating RN: 03/07/1951 (69 y.o. Harlon Flor, Millard.Loa Primary Care Provider: Katy Apo Other Clinician: Referring Provider: Treating Provider/Extender:Carlester Kasparek, [name], [name]: 194 Diagnosis Coding ICD-10 Codes Code Description [phone] Non-pressure chronic ulcer of right calf with necrosis of muscle E11.622 Type 2 diabetes mellitus with other skin ulcer E11.621 Type 2 diabetes mellitus with foot ulcer E11.51 Type 2 diabetes mellitus with diabetic peripheral angiopathy without gangrene Facility Procedures The patient participates with Medicare or their insurance follows the Medicare Facility Guidelines: CPT4 Code Description Modifier Quantity [phone] - WOUND CARE VISIT-LEV 3 EST PT 1 Physician Procedures CPT4 Code Description: [id number] - WC PHYS LEVEL 2 - EST PT ICD-10 Diagnosis Description L97.213 Non-pressure chronic ulcer of right calf with necrosis of E11.622 Type 2 diabetes mellitus with other skin ulcer Modifier: muscle Quantity: 1 Electronic Signature(s) Signed: 11/30/2019 5:58:56 PM By: Baltazar Najjar MD Entered By: Baltazar Najjar on 11/30/2019 15:41:00

## 2019-12-05 ENCOUNTER — Ambulatory Visit: Payer: Medicare Other | Attending: Internal Medicine

## 2019-12-05 DIAGNOSIS — Z23 Encounter for immunization: Secondary | ICD-10-CM

## 2019-12-05 NOTE — Progress Notes (Signed)
   Covid-19 Vaccination Clinic  Name:  ORMAN MATSUMURA    MRN: [id number] DOB: 04-01-1951  12/05/2019  Mr. Ballentine was observed post Covid-19 immunization for 15 minutes without incident. He was provided with Vaccine Information Sheet and instruction to access the V-Safe system.   Mr. Decarlo was instructed to call 911 with any severe reactions post vaccine: Marland Kitchen Difficulty breathing  . Swelling of face and throat  . A fast heartbeat  . A bad rash all over body  . Dizziness and weakness   Immunizations Administered    Name Date Dose VIS Date Route   Pfizer COVID-19 Vaccine 12/05/2019  4:00 PM 0.3 mL 08/04/2019 Intramuscular   Manufacturer: [employer], [employer]   Lot: [id number]   NDC: [address]-0

## 2019-12-06 NOTE — Progress Notes (Signed)
Reginald Ayers ([id number]) Visit Report for 11/30/2019 Arrival Information Details Patient Name: Date of Service: Reginald Ayers 11/30/2019 2:00 PM Medical Record [id number] Patient Account Number: [id number] Date of Birth/Sex: Treating RN: 1951-04-28 (69 y.o. [name] Primary Care Maranda Marte: Katy Apo Other Clinician: Referring Deontaye Civello: Treating Alfredo Spong/Extender:Robson, [name], [name]: 194 Visit Information History Since Last Visit Added or deleted any medications: No Patient Arrived: [name] Any new allergies or adverse reactions: No Arrival Time: 14:16 Had a fall or experienced change in No Accompanied By: self activities of daily living that may affect Transfer Assistance: None risk of falls: Patient Identification Verified: Yes Signs or symptoms of abuse/neglect since last No Secondary Verification Process Yes visito Completed: Hospitalized since last visit: No Patient Requires Transmission- No Implantable device outside of the clinic excluding No Based Precautions: cellular tissue based products placed in the center Patient Has Alerts: Yes since last visit: Patient Alerts: Patient on Blood Has Dressing in Place as Prescribed: Yes Thinner Pain Present Now: No Electronic Signature(s) Signed: 12/06/2019 9:19:04 AM By: Karl Ito Entered By: Karl Ito on 11/30/2019 14:17:09 -------------------------------------------------------------------------------- Clinic Level of Care Assessment Details Patient Name: Date of Service: Reginald Ayers 11/30/2019 2:00 PM Medical Record [id number] Patient Account Number: [id number] Date of Birth/Sex: Treating RN: May 26, 1951 (69 y.o. [name] Primary Care Idaliz Tinkle: Katy Apo Other Clinician: Referring Annaleah Arata: Treating Daeshawn Redmann/Extender:Robson, [name], [name]: 194 Clinic Level of Care Assessment Items TOOL 4 Quantity  Score X - Use when only an EandM is performed on FOLLOW-UP visit 1 0 ASSESSMENTS - Nursing Assessment / Reassessment X - Reassessment of Co-morbidities (includes updates in patient status) 1 10 X - Reassessment of Adherence to Treatment Plan 1 5 ASSESSMENTS - Wound and Skin Assessment / Reassessment X - Simple Wound Assessment / Reassessment - one wound 1 5 []  - Complex Wound Assessment / Reassessment - multiple wounds 0 X - Dermatologic / Skin Assessment (not related to wound area) 1 10 ASSESSMENTS - Focused Assessment X - Circumferential Edema Measurements - multi extremities 1 5 X - Nutritional Assessment / Counseling / Intervention 1 10 []  - Lower Extremity Assessment (monofilament, tuning fork, pulses) 0 []  - Peripheral Arterial Disease Assessment (using hand held doppler) 0 ASSESSMENTS - Ostomy and/or Continence Assessment and Care []  - Incontinence Assessment and Management 0 []  - Ostomy Care Assessment and Management (repouching, etc.) 0 PROCESS - Coordination of Care X - Simple Patient / Family Education for ongoing care 1 15 []  - Complex (extensive) Patient / Family Education for ongoing care 0 X - Staff obtains , Records, Test Results / Process Orders 1 10 []  - Staff telephones HHA, Nursing Homes / Clarify orders / etc 0 []  - Routine Transfer to another Facility (non-emergent condition) 0 []  - Routine Hospital Admission (non-emergent condition) 0 []  - New Admissions / / Ordering NPWT, Apligraf, etc. 0 []  - Emergency Hospital Admission (emergent condition) 0 X - Simple Discharge Coordination 1 10 []  - Complex (extensive) Discharge Coordination 0 PROCESS - Special Needs []  - Pediatric / Minor Patient Management 0 []  - Isolation Patient Management 0 []  - Hearing / Language / Visual special needs 0 []  - Assessment of Community assistance (transportation, D/C planning, etc.) 0 []  - Additional assistance / Altered mentation 0 []  - Support  Surface(s) Assessment (bed, cushion, seat, etc.) 0 INTERVENTIONS - Wound Cleansing / Measurement X - Simple Wound Cleansing - one wound 1 5 []  -  Complex Wound Cleansing - multiple wounds 0 X - Wound Imaging (photographs - any number of wounds) 1 5 []  - Wound Tracing (instead of photographs) 0 X - Simple Wound Measurement - one wound 1 5 []  - Complex Wound Measurement - multiple wounds 0 INTERVENTIONS - Wound Dressings X - Small Wound Dressing one or multiple wounds 1 10 []  - Medium Wound Dressing one or multiple wounds 0 []  - Large Wound Dressing one or multiple wounds 0 []  - Application of Medications - topical 0 []  - Application of Medications - injection 0 INTERVENTIONS - Miscellaneous []  - External ear exam 0 []  - Specimen Collection (cultures, biopsies, blood, body fluids, etc.) 0 []  - Specimen(s) / Culture(s) sent or taken to Lab for analysis 0 []  - Patient Transfer (multiple staff / Civil Service fast streamer / Similar devices) 0 []  - Simple Staple / Suture removal (25 or less) 0 []  - Complex Staple / Suture removal (26 or more) 0 []  - Hypo / Hyperglycemic Management (close monitor of Blood Glucose) 0 []  - Ankle / Brachial Index (ABI) - do not check if billed separately 0 X - Vital Signs 1 5 Has the patient been seen at the hospital within the last three years: Yes Total Score: 110 Level Of Care: New/Established - Level 3 Electronic Signature(s) Signed: 11/30/2019 6:08:37 PM By: Deon Pilling Entered By: Deon Pilling on 11/30/2019 14:58:18 -------------------------------------------------------------------------------- Encounter Discharge Information Details Patient Name: Date of Service: Reginald Ayers. 11/30/2019 2:00 PM Medical Record [id number] Patient Account Number: [id number] Date of Birth/Sex: Treating RN: June 17, 1951 (69 y.o. [name] Primary Care Quita Mcgrory: Kandice Hams Other Clinician: Referring Anishka Bushard: Treating Arick Mareno/Extender:Robson, Alice Reichert,  Lowella Dandy in Treatment: 194 Encounter Discharge Information Items Discharge Condition: Stable Ambulatory Status: Cane Discharge Destination: Home Transportation: Private Auto Accompanied By: self Schedule Follow-up Appointment: Yes Clinical Summary of Care: Electronic Signature(s) Signed: 11/30/2019 6:08:37 PM By: Deon Pilling Entered By: Deon Pilling on 11/30/2019 17:53:28 -------------------------------------------------------------------------------- Lower Extremity Assessment Details Patient Name: Date of Service: Reginald, Ayers 11/30/2019 2:00 PM Medical Record [id number] Patient Account Number: [id number] Date of Birth/Sex: Treating RN: Jan 01, 1951 (69 y.o. [name] Primary Care Lexey Fletes: Kandice Hams Other Clinician: Referring Kila Godina: Treating Lilo Wallington/Extender:Robson, Alice Reichert, Lowella Dandy in Treatment: 194 Edema Assessment Assessed: [Left: No] [Right: No] Edema: [Left: Ye] [Right: s] Calf Left: Right: Point of Measurement: 30 cm From Medial Instep cm 30 cm Ankle Left: Right: Point of Measurement: 10 cm From Medial Instep cm 19 cm Vascular Assessment Pulses: Dorsalis Pedis Palpable: [Right:Yes] Electronic Signature(s) Signed: 12/04/2019 6:12:10 PM By: Levan Hurst RN, BSN Entered By: Levan Hurst on 11/30/2019 14:45:08 -------------------------------------------------------------------------------- [place] Details Patient Name: Date of Service: Reginald Ayers 11/30/2019 2:00 PM Medical Record [id number] Patient Account Number: [id number] Date of Birth/Sex: Treating RN: [birth year] (69 y.o. [name] Primary Care Laterrica Libman: Kandice Hams Other Clinician: Referring Laylah Riga: Treating Damara Klunder/Extender:Robson, Alice Reichert, Lowella Dandy in Treatment: 194 Active Inactive Nutrition Nursing Diagnoses: Potential for alteratiion in Nutrition/Potential for imbalanced  nutrition Goals: Patient/caregiver agrees to and verbalizes understanding of need to obtain nutritional consultation Date Initiated: 03/09/2019 Target Resolution Date: 12/22/2019 Goal Status: Active Interventions: Provide education on nutrition Treatment Activities: Education provided on Nutrition : 09/21/2019 Patient referred to Primary Care Physician for further nutritional evaluation : 03/09/2019 Notes: Electronic Signature(s) Signed: 11/30/2019 6:08:37 PM By: Deon Pilling Entered By: Deon Pilling on 11/30/2019 14:43:52 -------------------------------------------------------------------------------- Pain Assessment Details Patient Name: Date of Service: Reginald Ayers.  11/30/2019 2:00 PM Medical Record [id number] Patient Account Number: [id number] Date of Birth/Sex: Treating RN: [birth year] (69 y.o. [name] Primary Care Cloyd Ragas: Katy Apo Other Clinician: Referring Prescious Hurless: Treating Belina Mandile/Extender:Robson, [name], [name]: 194 Active Problems Location of Pain Severity and Description of Pain Patient Has Paino No Site Locations Pain Management and Medication Current Pain Management: Electronic Signature(s) Signed: 11/30/2019 6:08:37 PM By: Shawn Stall Signed: 12/06/2019 9:19:04 AM By: Karl Ito Entered By: Karl Ito on 11/30/2019 14:19:50 -------------------------------------------------------------------------------- Patient/Caregiver Education Details Patient Name: Date of Service: Reginald Ayers 4/8/2021andnbsp2:00 PM Medical Record [id number] Patient Account Number: [id number] Date of Birth/Gender: 19-Dec-1950 (69 y.o. M) Treating RN: Shawn Stall Primary Care Physician: Katy Apo Other Clinician: Referring Physician: Treating Physician/Extender:Robson, [name], [name]: 194 Education Assessment Education Provided To: Patient Education Topics  Provided Nutrition: Handouts: Nutrition Methods: Explain/Verbal Responses: Reinforcements needed Electronic Signature(s) Signed: 11/30/2019 6:08:37 PM By: Shawn Stall Entered By: Shawn Stall on 11/30/2019 14:44:05 -------------------------------------------------------------------------------- Wound Assessment Details Patient Name: Date of Service: Reginald, Ayers 11/30/2019 2:00 PM Medical Record [id number] Patient Account Number: [id number] Date of Birth/Sex: Treating RN: 09-Nov-1950 (69 y.o. Harlon Flor, Millard.Loa Primary Care Scotti Motter: Katy Apo Other Clinician: Referring Kirrah Mustin: Treating Melchizedek Espinola/Extender:Robson, [name], [name]: 194 Wound Status Wound Number: [id number] Primary Diabetic Wound/Ulcer of the Lower Extremity Etiology: Wound Location: Right, Anterior Lower Leg Wound Open Wounding Event: Gradually Appeared Status: Date Acquired: 04/21/2018 Comorbid Chronic sinus problems/congestion, Chronic Weeks Of Treatment: 82 History: Obstructive Pulmonary Disease (COPD), Clustered Wound: No Coronary Artery Disease, Hypertension, Peripheral Arterial Disease, Type II Diabetes, Received Chemotherapy Wound Measurements Length: (cm) 0.3 Width: (cm) 0.3 Depth: (cm) 0.1 Area: (cm) 0.071 Volume: (cm) 0.007 Wound Description Classification: Grade 2 Wound Margin: Flat and Intact Exudate Amount: Medium Exudate Type: Serosanguineous Exudate Color: red, brown Wound Bed Granulation Amount: Large (67-100%) Granulation Quality: Red Necrotic Amount: None Present (0%) fter Cleansing: No ino Yes Exposed Structure sed: No Subcutaneous Tissue) Exposed: Yes sed: No sed: No ed: No d: No % Reduction in Area: 92.5% % Reduction in Volume: 92.6% Epithelialization: Medium (34-66%) Tunneling: No Undermining: No Foul Odor A Slough/Fibr Fascia Expo Fat Layer ( Tendon Expo Muscle Expo Joint Expos Bone Expose Treatment Notes Wound #5R  (Right, Anterior Lower Leg) 1. Cleanse With Wound Cleanser 2. Periwound Care Skin Prep 3. Primary Dressing Applied Polymem 4. Secondary Dressing Foam Border Dressing 5. Secured With Advice worker) Signed: 11/30/2019 6:08:37 PM By: Shawn Stall Signed: 12/04/2019 6:12:10 PM By: Zandra Abts RN, BSN Entered By: Zandra Abts on 11/30/2019 14:45:29 -------------------------------------------------------------------------------- Vitals Details Patient Name: Date of Service: Reginald Ayers 11/30/2019 2:00 PM Medical Record [id number] Patient Account Number: [id number] Date of Birth/Sex: Treating RN: Sep 02, 1950 (69 y.o. [name] Primary Care Aowyn Rozeboom: Katy Apo Other Clinician: Referring Tempie Gibeault: Treating Toan Mort/Extender:Robson, [name], [name]: 194 Vital Signs Time Taken: 14:19 Temperature (F): 98.2 Height (in): 61 Pulse (bpm): 87 Weight (lbs): 122 Respiratory Rate (breaths/min): 19 Body Mass Index (BMI): 23 Blood Pressure (mmHg): 171/73 Reference Range: 80 - 120 mg / dl Electronic Signature(s) Signed: 12/06/2019 9:19:04 AM By: Karl Ito Entered By: Karl Ito on 11/30/2019 14:19:41

## 2019-12-06 NOTE — Progress Notes (Signed)
Reginald Ayers ([id number]) Visit Report for 10/19/2019 Arrival Information Details Patient Name: Date of Service: Reginald Ayers 10/19/2019 2:30 PM Medical Record [id number] Patient Account Number: [id number] Date of Birth/Sex: Treating RN: [birth year] (69 y.o. [name] Primary Care Yida Hyams: Katy Apo Other Clinician: Referring Tru Leopard: Treating Djibril Glogowski/Extender:Robson, [name], [name]: 188 Visit Information History Since Last Visit Added or deleted any medications: No Patient Arrived: Ambulatory Any new allergies or adverse reactions: No Arrival Time: 14:23 Had a fall or experienced change in No Accompanied By: self activities of daily living that may affect Transfer Assistance: None risk of falls: Patient Identification Verified: Yes Signs or symptoms of abuse/neglect since last No Secondary Verification Process Yes visito Completed: Hospitalized since last visit: No Patient Requires Transmission- No Implantable device outside of the clinic excluding No Based Precautions: cellular tissue based products placed in the center Patient Has Alerts: Yes since last visit: Patient Alerts: Patient on Blood Has Dressing in Place as Prescribed: Yes Thinner Pain Present Now: No Electronic Signature(s) Signed: 12/06/2019 9:23:26 AM By: Karl Ito Entered By: Karl Ito on 10/19/2019 14:24:10 -------------------------------------------------------------------------------- Clinic Level of Care Assessment Details Patient Name: Date of Service: Reginald Ayers 10/19/2019 2:30 PM Medical Record [id number] Patient Account Number: [id number] Date of Birth/Sex: Treating RN: Dec 26, 1950 (69 y.o. [name] Primary Care Trichelle Lehan: Katy Apo Other Clinician: Referring Sims Laday: Treating Layney Gillson/Extender:Robson, [name], [name]: 188 Clinic Level of Care Assessment Items TOOL 4  Quantity Score X - Use when only an EandM is performed on FOLLOW-UP visit 1 0 ASSESSMENTS - Nursing Assessment / Reassessment X - Reassessment of Co-morbidities (includes updates in patient status) 1 10 X - Reassessment of Adherence to Treatment Plan 1 5 ASSESSMENTS - Wound and Skin Assessment / Reassessment X - Simple Wound Assessment / Reassessment - one wound 1 5 []  - Complex Wound Assessment / Reassessment - multiple wounds 0 X - Dermatologic / Skin Assessment (not related to wound area) 1 10 ASSESSMENTS - Focused Assessment X - Circumferential Edema Measurements - multi extremities 1 5 X - Nutritional Assessment / Counseling / Intervention 1 10 []  - Lower Extremity Assessment (monofilament, tuning fork, pulses) 0 []  - Peripheral Arterial Disease Assessment (using hand held doppler) 0 ASSESSMENTS - Ostomy and/or Continence Assessment and Care []  - Incontinence Assessment and Management 0 []  - Ostomy Care Assessment and Management (repouching, etc.) 0 PROCESS - Coordination of Care X - Simple Patient / Family Education for ongoing care 1 15 []  - Complex (extensive) Patient / Family Education for ongoing care 0 X - Staff obtains , Records, Test Results / Process Orders 1 10 []  - Staff telephones HHA, Nursing Homes / Clarify orders / etc 0 []  - Routine Transfer to another Facility (non-emergent condition) 0 []  - Routine Hospital Admission (non-emergent condition) 0 []  - New Admissions / / Ordering NPWT, Apligraf, etc. 0 []  - Emergency Hospital Admission (emergent condition) 0 X - Simple Discharge Coordination 1 10 []  - Complex (extensive) Discharge Coordination 0 PROCESS - Special Needs []  - Pediatric / Minor Patient Management 0 []  - Isolation Patient Management 0 []  - Hearing / Language / Visual special needs 0 []  - Assessment of Community assistance (transportation, D/C planning, etc.) 0 []  - Additional assistance / Altered mentation 0 []  -  Support Surface(s) Assessment (bed, cushion, seat, etc.) 0 INTERVENTIONS - Wound Cleansing / Measurement X - Simple Wound Cleansing - one wound 1 5 []  -  Complex Wound Cleansing - multiple wounds 0 X - Wound Imaging (photographs - any number of wounds) 1 5 []  - Wound Tracing (instead of photographs) 0 X - Simple Wound Measurement - one wound 1 5 []  - Complex Wound Measurement - multiple wounds 0 INTERVENTIONS - Wound Dressings X - Small Wound Dressing one or multiple wounds 1 10 []  - Medium Wound Dressing one or multiple wounds 0 []  - Large Wound Dressing one or multiple wounds 0 []  - Application of Medications - topical 0 []  - Application of Medications - injection 0 INTERVENTIONS - Miscellaneous []  - External ear exam 0 []  - Specimen Collection (cultures, biopsies, blood, body fluids, etc.) 0 []  - Specimen(s) / Culture(s) sent or taken to Lab for analysis 0 []  - Patient Transfer (multiple staff / / Similar devices) 0 []  - Simple Staple / Suture removal (25 or less) 0 []  - Complex Staple / Suture removal (26 or more) 0 []  - Hypo / Hyperglycemic Management (close monitor of Blood Glucose) 0 []  - Ankle / Brachial Index (ABI) - do not check if billed separately 0 X - Vital Signs 1 5 Has the patient been seen at the hospital within the last three years: Yes Total Score: 110 Level Of Care: New/Established - Level 3 Electronic Signature(s) Signed: 10/19/2019 6:07:18 PM By: Entered By: on 10/19/2019 14:53:11 -------------------------------------------------------------------------------- Encounter Discharge Information Details Patient Name: Date of Service: 10/19/2019 2:30 PM Medical Record Patient Account Number: Date of Birth/Sex: Treating RN: 1950/08/30 (69 y.o. Primary Care Brenya Taulbee: Other Clinician: Referring Inmer Nix: Treating Conlan Miceli/Extender:Robson,  , in Treatment: 188 Encounter Discharge Information Items Discharge Condition: Stable Ambulatory Status: Cane Discharge Destination: Home Transportation: Private Auto Accompanied By: self Schedule Follow-up Appointment: Yes Clinical Summary of Care: Patient Declined Electronic Signature(s) Signed: 10/19/2019 5:28:31 PM By: Shawn Stall RN, BSN Entered By: Shawn Stall on 10/19/2019 15:06:39 -------------------------------------------------------------------------------- Lower Extremity Assessment Details Patient Name: Date of Service: LEVAUGHN, PUCCINELLI 10/19/2019 2:30 PM Medical Record [id number] Patient Account Number: [id number] Date of Birth/Sex: Treating RN: 1951-02-08 (69 y.o. Damaris Schooner Primary Care Brenner Visconti: Katy Apo Other Clinician: Referring Maurisha Mongeau: Treating Kynzi Levay/Extender:Robson, [name], [name]: 188 Edema Assessment Assessed: [Left: No] [Right: No] Edema: [Left: Ye] [Right: s] Calf Left: Right: Point of Measurement: 30 cm From Medial Instep cm 28.4 cm Ankle Left: Right: Point of Measurement: 10 cm From Medial Instep cm 18.2 cm Vascular Assessment Pulses: Dorsalis Pedis Palpable: [Right:No] Electronic Signature(s) Signed: 10/23/2019 5:57:14 PM By: Zenaida Deed RN, BSN Entered By: Zenaida Deed on 10/19/2019 14:36:02 -------------------------------------------------------------------------------- Multi Wound Chart Details Patient Name: Date of Service: [name] 10/19/2019 2:30 PM Medical Record [id number] Patient Account Number: [id number] Date of Birth/Sex: Treating RN: 07/19/1951 (69 y.o. 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] Comorbid History: [5R:Chronic sinus problems/congestion, Chronic Obstructive Pulmonary Disease (COPD), Coronary Artery Disease, Hypertension, Peripheral Arterial Disease, Type II Diabetes, Received Chemotherapy] [N/A:N/A] Date Acquired: [5R:04/21/2018] [N/A:N/A] Weeks of Treatment: [5R:76] [N/A:N/A] Wound Status: [5R:Open] [N/A:N/A] Wound Recurrence: [5R:Yes] [N/A:N/A]  Measurements L x W x D 2x2x0.1 [N/A:N/A] (cm) Area (cm) : [5R:3.142] [N/A:N/A] Volume (cm) : [5R:0.314] [N/A:N/A] % Reduction in Area: [5R:-233.50%] [N/A:N/A] % Reduction in Volume: -234.00% [N/A:N/A] Classification: [5R:Grade 2] [N/A:N/A] Exudate Amount: [5R:Medium] [N/A:N/A] Exudate Type: [5R:Serosanguineous] [N/A:N/A] Exudate Color: [5R:red, brown] [N/A:N/A] Wound Margin: [5R:Flat and Intact] [N/A:N/A] Granulation Amount: [5R:Large (67-100%)] [N/A:N/A] Granulation Quality: [5R:Red] [N/A:N/A] Necrotic Amount: [5R:Small (1-33%)] [N/A:N/A] Exposed Structures: [5R:Fat Layer (Subcutaneous Tissue) Exposed: Yes Fascia: No Tendon: No Muscle: No Joint: No Bone: No Small (1-33%)] [N/A:N/A N/A] Treatment Notes Wound #5R (Right, Anterior Lower Leg) 2. Periwound Care Skin Prep 3. Primary Dressing Applied Polymem 4. Secondary Dressing Foam Border Dressing Notes explained the new dressing applied. patient in agreement. Electronic Signature(s) Signed: 10/19/2019 5:41:09 PM By: Linton Ham MD Signed: 10/19/2019 6:07:18 PM By: Deon Pilling Entered By: Linton Ham on 10/19/2019 15:37:51 -------------------------------------------------------------------------------- Multi-Disciplinary Care Plan Details Patient Name: Date of  Service: [name] 10/19/2019 2:30 PM Medical Record [id number] Patient Account Number: [id number] Date of Birth/Sex: Treating RN: Sep 10, 1950 (69 y.o. [name] Primary Care Micalah Cabezas: Kandice Hams Other Clinician: Referring Lenzie Sandler: Treating Emelie Newsom/Extender:Robson, Alice Reichert, Lowella Dandy in Treatment: [phone] Active Inactive Nutrition Nursing Diagnoses: Potential for alteratiion in Nutrition/Potential for imbalanced nutrition Goals: Patient/caregiver agrees to and verbalizes understanding of need to obtain nutritional consultation Date Initiated: 03/09/2019 Target Resolution Date: 11/24/2019 Goal Status: Active Interventions: Provide education on nutrition Treatment Activities: Education provided on Nutrition : 09/21/2019 Patient referred to Primary Care Physician for further nutritional evaluation : 03/09/2019 Notes: Electronic Signature(s) Signed: 10/19/2019 6:07:18 PM By: Deon Pilling Entered By: Deon Pilling on 10/19/2019 14:23:06 -------------------------------------------------------------------------------- Pain Assessment Details Patient Name: Date of Service: WEAVER, TWEED 10/19/2019 2:30 PM Medical Record [id number] Patient Account Number: [id number] Date of Birth/Sex: Treating RN: 1951-04-25 (69 y.o. [name] Primary Care Dallis Czaja: Kandice Hams Other Clinician: Referring Livi Mcgann: Treating Jaileigh Weimer/Extender:Robson, Alice Reichert, Lowella Dandy in Treatment: 188 Active Problems Location of Pain Severity and Description of Pain Patient Has Paino No Site Locations Pain Management and Medication Current Pain Management: Electronic Signature(s) Signed: 10/19/2019 6:07:18 PM By: Deon Pilling Signed: 12/06/2019 9:23:26 AM By: Sandre Kitty Entered By: Sandre Kitty on 10/19/2019 14:30:54 -------------------------------------------------------------------------------- Patient/Caregiver Education Details Patient  Name: Date of Service: [name] 2/25/2021andnbsp2:30 PM Medical Record [phone] Patient Account Number: [id number] Date of Birth/Gender: 1951-03-05 (69 y.o. M) Treating RN: Deon Pilling Primary Care Physician: Kandice Hams Other Clinician: Referring Physician: Treating Physician/Extender:Robson, Alice Reichert, Lowella Dandy in Treatment: 188 Education Assessment Education Provided To: Patient Education Topics Provided Nutrition: Handouts: Elevated Blood Sugars: How Do They Affect Wound Healing Methods: Explain/Verbal Responses: Reinforcements needed Electronic Signature(s) Signed: 10/19/2019 6:07:18 PM By: Deon Pilling Entered By: Deon Pilling on 10/19/2019 14:23:18 -------------------------------------------------------------------------------- Wound Assessment Details Patient Name: Date of Service: DEMARQUES, PILZ 10/19/2019 2:30 PM Medical Record [id number] Patient Account Number: [id number] Date of Birth/Sex: Treating RN: 09/23/1950 (69 y.o. [name] Primary Care Journii Nierman: Kandice Hams Other Clinician: Referring Dianna Deshler: Treating Tristan Proto/Extender:Robson, Alice Reichert, Lowella Dandy in Treatment: 188 Wound Status Wound Number: 5R Primary Diabetic Wound/Ulcer of the Lower Extremity Etiology: Wound Location: Right Lower Leg - Anterior Wound Open Wounding Event: Gradually Appeared Status: Date Acquired: 04/21/2018 Comorbid Chronic sinus problems/congestion, Chronic Weeks Of Treatment: 76 History: Obstructive Pulmonary Disease (COPD), Clustered Wound: No Coronary Artery Disease, Hypertension, Peripheral Arterial Disease, Type II Diabetes, Received Chemotherapy Photos Wound Measurements Length: (cm) 2 % Reducti Width: (cm) 2 % Reducti Depth: (cm)  0.1 Epithelia Area: (cm) 3.142 Tunnelin Volume: (cm) 0.314 Undermin Wound Description Classification: Grade 2 Wound Margin: Flat and Intact Exudate Amount:  Medium Exudate Type: Serosanguineous Exudate Color: red, brown Wound Bed Granulation Amount: Large (67-100%) Granulation Quality: Red Necrotic Amount: Small (1-33%) Necrotic Quality: Adherent Slough Foul Odor After Cleansing: No Slough/Fibrino Yes Exposed Structure Fascia Exposed: No Fat Layer (Subcutaneous Tissue) Exposed: Yes Tendon Exposed: No Muscle Exposed: No Joint Exposed: No Bone Exposed: No on in Area: -233.5% on in Volume: -234% lization: Small (1-33%) g: No ing: No Electronic Signature(s) Signed: 10/20/2019 4:47:37 PM By: Shawn Stall Signed: 10/20/2019 5:12:28 PM By: Benjaman Kindler EMT/HBOT Previous Signature: 10/19/2019 6:07:18 PM Version By: Shawn Stall Entered By: Benjaman Kindler on 10/20/2019 15:36:14 -------------------------------------------------------------------------------- Vitals Details Patient Name: Date of Service: [name] 10/19/2019 2:30 PM Medical Record [id number] Patient Account Number: [id number] Date of Birth/Sex: Treating RN: [birth year] (69 y.o. [name] Primary Care Tapanga [place]taway: Katy Apo Other Clinician: Referring Wealthy Danielski: Treating Attallah Ontko/Extender:Robson, [name], [name]: 188 Vital Signs Time Taken: 14:30 Temperature (F): 98.4 Height (in): 61 Pulse (bpm): 88 Weight (lbs): 122 Respiratory Rate (breaths/min): 18 Body Mass Index (BMI): 23 Blood Pressure (mmHg): 177/65 Reference Range: 80 - 120 mg / dl Electronic Signature(s) Signed: 12/06/2019 9:23:26 AM By: Karl Ito Entered By: Karl Ito on 10/19/2019 14:30:49

## 2019-12-21 ENCOUNTER — Other Ambulatory Visit: Payer: Self-pay

## 2019-12-21 ENCOUNTER — Encounter (HOSPITAL_BASED_OUTPATIENT_CLINIC_OR_DEPARTMENT_OTHER): Payer: Medicare Other | Admitting: Internal Medicine

## 2019-12-21 DIAGNOSIS — E1151 Type 2 diabetes mellitus with diabetic peripheral angiopathy without gangrene: Secondary | ICD-10-CM | POA: Diagnosis not present

## 2019-12-21 DIAGNOSIS — E11622 Type 2 diabetes mellitus with other skin ulcer: Secondary | ICD-10-CM | POA: Diagnosis not present

## 2019-12-21 DIAGNOSIS — L97213 Non-pressure chronic ulcer of right calf with necrosis of muscle: Secondary | ICD-10-CM | POA: Diagnosis not present

## 2019-12-21 NOTE — Progress Notes (Signed)
Reginald, Ayers ([id number]) Visit Report for 12/21/2019 HPI Details Patient Name: Date of Service: Reginald Ayers, Reginald Ayers 12/21/2019 2:00 PM Medical Record Number: [id number] Patient Account Number: [id number] Date of Birth/Sex: Treating RN: Dec 02, 1950 (69 y.o. Reginald Ayers Primary Care Provider: Katy Apo Other Clinician: Referring Provider: Treating Provider/Extender: Demetria Pore in Treatment: 197 History of Present Illness HPI Description: 03/06/16; this is a patient who is a type II diabetic with a recent hemoglobin A1c of 7.6. He is referred from Dr. Allyson Sabal for aggressive wound care on a wound on his right anterior leg. He has known severe PAD and has an extensive interventional history. His left common iliac artery was stented in December 2003 and his right superficial femoral artery in April 2008. He has significant infrapopliteal disease bilaterally and has known occluded left SFA. His ABIs from September 2016 showed a ABI of 0.48 on the right and 0.52 on the left although he does not really describe claudication. He also has known carotid artery disease, coronary artery disease and is status post CABG in 2010. He tells me he smokes 3 cigarettes a day. The patient tells me his at the wound on the right anterior leg for about 6 months. He has a smaller area on the left medial leg that has been present for one month. He's been to see his primary physician Dr. polite and Dr. Allyson Sabal I am not clear what he is been using on this. It is felt that his ulcer is an ischemic ulcer on the pretibial region. It is noted that if we are unable to heal this it was felt he would need repeat angiography and potential percutaneous revascularization. 03/13/16; the area on the left medial leg is healed over. His ischemic wound on the right anterior leg is debridement of a lot of nonviable subcutaneous tissue we've been using Santyl 03/20/16; he has no open area on the left medial  leg. The wound on the right anterior leg looks ischemic. A lot of nonviable tissue. Our intake nurse reported that he had a lot of dried blood clots around the wound presumably from last week's debridement. There is no evidence of infection 04/16/16; the area on the right anterior leg is an ischemic wound. He is been back to see Dr. Allyson Sabal who states he is not a candidate for revascularization. He has a large area of occlusion from his groin down which cannot be revascularized. Since Dr. Allyson Sabal is aggressive with this type of situation I can only surmised that that's the correct decision 05/01/16 the area on the right leg is an ischemic wound. He has not a candidate for revascularization. He has not smoked in 48 hours. Has been using Santyl wound size is not any smaller however this surface looks somewhat better 05/08/16; ischemic wound on the right anterior leg. He is not a candidate for revascularization. He continues with nonsmoking for the last week. I use Prisma last week although that probably wasn't a good decision, covered in a difficult surface eschar today requiring debridement 05/15/16; patient with ischemic wound on his right anterior leg. He is not a candidate for revascularization. Complains of a lot of pain this week/burning over the wound area making it difficult for him to sleep 05/21/16; patient states less pain this week. The wound had circumferential fibrinous slough that I removed along with some nonviable subcutaneous tissue. We used Hydrofera Blue last week 05/28/16; patient states he has not had any pain. Some minor reduction  in the area. Still using Hydrofera Blue 06/04/16; no pain still using Hydrofera Blue 06/11/16 no complaints still using Hydrofera Blue 06/18/16; I changed him to Endoform last week. 06/25/16; wound still appears healthy. Minor debridement this week still using Endoform 07/02/16; the wound bed certainly appears healthier. Rim of epithelialization superiorly was  there last week. It certainly has not progressed any further. Using Endoform 07/09/16 patient is here for a review of the wound on the right anterior leg. He has a history of severe and prolonged PAD followed by Dr. Allyson Sabal in the setting of type 2 diabetes. I have been using Endoform for the last 3 or 4 weeks. He has a rim of epithelialization superiorly but I have not been able to advance this. He complains of pain but there is no clear infection 07/15/2016 -- the patient was here for a nurse visit but due to the presence of a new wound I was asked to see him for an opinion 07/23/16; the patient was seen last week by Dr. Meyer Russel in my absence. He had a new wound on his right dorsal ankle. Santyl was applied to this new area and also his original wound on the right anterior tibia. He arrives today complaining of a lot of pain 07/30/16; the patient has new wound on his right dorsal ankle from 2 weeks ago. This is as usual covered with a thick eschar. I think this is ischemic and I'm not expecting to be able to heal this. Santyl was applied for but denied by his insurance we will therefore use Medihoney. He only obtained this later today. He has left a wrap on all week with Santyl. 08/06/16 both wounds in the same situation still a gelatinous surface slough. He finds the Medihoney makes him feel pain. He could not afford this Santyl opiate 08/20/16; patient arrives today frustrated about his nonhealing wounds now 2 on his right leg. He arrived here with a wound on the right anterior leg mid aspect and is developed a second area on the right dorsal ankle. I have reviewed Dr. Hazle Coca notes and his visit from August/17. There he makes clear reference to the fact that the vessels were not revascularizable. The patient also describes fairly clear claudication at rest which makes him sleep with his legs in a dependent position. He has given up smoking as of yesterday 09/03/16; patient is going for a second  opinion on his vascular status of vein and vascular next week. We have been using Medihoney to the wounds. He is tolerating his discomfort. 09/17/16; the patient is going to have an angiogram on January 30. Miraculously I can't really see has had a recent angiogram although he has been to see Dr. Allyson Sabal who does not think here is cerebrovascular rise visible option. He does have stents in both legs that Dr. Allyson Sabal put and I think about 9 years ago. I would like to continue to use Medihoney until we see the results of his angiogram. 10/01/16; the patient had his angiogram. He has stent previously which was dilated and then a stent was placed in the right superficial femoral artery. He had failed balloon angioplasty of the right posterior tibial artery. A further stent was placed in the superficial femoral artery. An angioplasty was also done. He was noted to have severe stenosis versus occlusion of the posterior tibial artery which is the dominant vessel across the foot. Intervention was attempted here but was unsuccessful. If the patient is unable to heal his wounds it  was felt he could be brought back for repeat attempt 10/15/16- patient is here for follow-up evaluation of his right lower extremity and right dorsal foot ulcers. He was unable to make last week's appointment secondary to a death in the family. He has been using Iodosorb/Iodoflex dressing changes per home health. He states that his primary care physician follows an A1c every 3 months, with his last A1c being 7.8 which is elevated from the previous. He states he has a scheduled A1c for next month. He also states he has a follow-up appointment with Vein and Vascular in April 10/22/16- Mr. Micciche did have an xray of the right foot, revealing no definite evidence of bone destruction to suggest osteomyelitis. This was performed at [facility]. He has been wearing his show with a rolled tongue since the last visit when he leaves the home,  otherwise he wears loose house slippers around the house. He is voicing no complaints or concerns since his last visit. He admits to smoking "maybe one a week" 10/29/16- patient is here for follow-up evaluation of his right lower extremity and right dorsal foot ulcers. He states that he has completely stopped smoking but does admit to dipping tobacco. We have not found out his coverage for both [employer] and Apligraf. He continues to offload this area with his walking shoe otherwise wears a slipper at home. He brought this (and it does appear to allow for relief of pressure to the dorsal part of his foot. 11/05/16; patient is here for follow-up of his right lower extremity and right dorsal foot ulcers. He has been revascularized. He no longer smokes. We have been using Prisma. 11/12/16- Mr. Weidler arrives for follow up evaluation of his right lower extremity ulcers. We have been using Prisma. He is planned for an Apligraf application today. 11/19/16- Mr.Brunetti arrives for follow-up evaluation of his right lower extremity and right dorsal foot ulcer one week after Apligraf #1 application. He states the home health came out on Monday and change the foam, Kerlix and Coban and and they did not interrupt the Adaptic touch. He has a follow-up appointment with ultrasound on Monday with vein and vascular 11/26/16- he is here for follow-up evaluation. He is s/p Apligraf #1 application 2 weeks ago. he had an appointment with vascular on Monday for arterial ultrasound. In evaluation of the stent to the right SFA which indicates biphasic waveform proximal to the stent and at the origin of the stent otherwise monophasic flow throughout the stent and at the distal aspect of the stent. An ABI done in that office on Monday was 0.73 (previously 0.47). He has biphasic flow in the right posterior tibial and monophasic flow in the dorsalis pedis. He has a follow-up with Dr. Myra Gianotti on Monday, 4/9 12/03/16- He is here for follow-up  evaluation. He did see Dr. Myra Gianotti on Monday and has a scheduled aortogram with lower extremity arteriogram on the 17th. He was not compressed over the last week. His wound culture came back Oxacillin sensitive staph aureus. He was prescribed Keflex and has been taking that without any voiced complaints. He states that he started taking his Lasix, although he did not contact or have an appointment with Dr. Nehemiah Settle. He does have an appointment in May 12/10/16- he is here for follow-up evaluation. He had an intervention on 4/17 with Dr. Myra Gianotti. According to his dictation 1) the patient has a widely patent right SFA and popliteal stent 2) after the an 80% occlusion of the right PTA  was treated with atherectomy and balloon angioplasty with a residual stenosis of less than 5%. The patient is voicing significant improvement and overall feeling; states that he can now sleep in the bed without any symptoms. His completed his Keflex with no adverse reaction. He continues to self-regulate his Lasix, he is not on any potassium supplementation. 12/17/16; Arrives today with weeping edema thru the dorsal foot wound and lower leg. He has not been using his Lasix and did not have compression on his leg. According to the patient he has 40mg  Lasix at home which he takes prn but not in weeks 12/21/16; less edema and no weeping edema. Apligraf #1 12/31/16; both wounds look somewhat better especially the area distally. Apligraf #2 01/14/17; Apligraf #3. Wounds looked improved. Patient saw main and vascular. ABIs on 01/11/17 done in their office showed a patent SFA stent status post atherectomy with angioplasty of the right PTA [12/08/16] biphasic flow on the right 0.95 monophasic flow on the left 0.45. 01/28/17; Apligraf #4. Wounds not particularly better than I remember. 02/11/17; Apligraf #5 the wounds have some improvement. The distal 1 has had some epithelialization proximal one is somewhat smaller. 02/25/17; he arrived in  clinic today with a disappointing amount of surface necrotic material. This is especially true on the distal anterior foot/ankle wound. Change primary dressing the Hydrofera Blue today 03/04/17; some improvement in the proximal wound on the tibia. The distal wound on the dorsal foot/ankle has too much necrotic surface. We have been using Hydrofera Blue although there was apparently some problem with obtaining it through his home health company however the patient reports he now has it 03/11/17; wounds measure slightly smaller. Surface looks questionable although with the improvement in dimensions will likely continue with the [facility] without debridement today 03/18/17; no major change in these wounds. Questionable surface. Debridement today. Using [facility] 03/25/17; the patient has follow-up noninvasive vascular tests with vein and vascular later this month. The wounds himself are not measuring any different however the proximal wound bed looks better with the Iodoflex not much change with the area over the dorsal foot. 04/22/17; patient had repeat angiography on 04/20/17. He had a drug-coated balloon angioplasty of the right superficial femoral artery balloon angioplasty right common femoral artery. He had stenosis of previously placed stents within the right superficial femoral artery this was treated with drug coated balloon angioplasty. He continues to have single vessel runoff via the peroneal artery He has been using Hydrofera Blue 04/29/17; no major change this week. Wound beds appear healthy. No debridement. He apparently did not tolerate Iodoflex which I was using prior to modification. I'm going to change him to Endo form with hydrogel 05/13/17; wound beds again appear a lot better, paradoxically the wound on the dorsal foot appears to be progressing better than the proximal wound. Using Endoform with hydrogel 06/03/17; doing well. wounds smaller especially the wound on the dorsal foot.  using endoform 06/17/17; two-week follow-up everything is going really nicely here. Using Endoform he only has one small open area left on the right dorsal foot/ankle. Also improved on the right anterior leg. He is retired from smoking since starting with [place] in this clinic 07/01/17; two-week follow-up the area on his right anterior foot/ankle is just about closed. The area superiorly on the lower mid calf as adherent necrotic debris which will need debridement but it is also down in terms of dimensions. We'll use Endoform 07/19/17 on evaluation today patient appears to be doing well with  his Current wound care measures and there does not appear to be necrotic tissue which is good news. The Endoform however does seem to be causing some epithelialization although this is loose at the proximal/12 o'clock location and seems to be pulling off with dressing changes. No fevers, chills, nausea, or vomiting noted at this time. He has no significant discomfort. 08/05/17; the patient has 2 small open areas on the proximal foot and a larger wound on the right anterior leg. Change to [facility] Blue last time probably because of the hyper granulation proximally 08/26/17; the patient has 2 small open areas on the proximal foot wound which is closed. The other is covered with a thin surface eschar. There is a similar wound on the right anterior leg. We've been using Hydrofera Blue 09/09/17*; the patient went for angiogram with Dr. Myra Gianotti. Apparently a routine scheduled arterial duplex suggested occlusion of his stent however with the angiography the stent was widely patent. No procedure was necessary. He also apparently has had a long-standing pressure area on the left buttock which dates back to prior to Thanksgiving although he's never really said anything to anybody about this. He is been attempting to treated himself with triple antibiotic cream etc. We've been using silver collagen to the wounds on his right leg  and dorsal right foot. These are largely unchanged today 09/23/17; patient's wounds are smaller in both areas and are just about closed. We have been using silver collagen he shows me an area on his buttock that is in the gluteal fold. This would be an atypical place for a pressure sore like a herpetic lesion although he says it's been there for a long time 2/14/19the patient's wound is totally closed. The area on the dorsal foot is also close to closing. I looked again at the area on his left buttock in the gluteal fold. He tells me that this is been there for 6 months. This more looks more like a pressure ulcer to me than it did when I looked at this 2 weeks ago. I simply put topical antibiotics on this but we are going to have to change to a more formal dressing 10/13/17 on evaluation today patient appears to be doing very well in regard to his right dorsal foot wound. He has been tolerating the dressing changes without complication. Fortunately there does not appear to be any evidence of infection which is good news. He continues to have a left gluteal ulcer as well as he has been tolerating the Prisma dressing on although this is not significantly smaller it does sound like he's been putting other things on this as well including some kind of frame he in fact this order another cream as well although I'm not exactly sure what this is. It's something that he "researched on the Internet that is supposed to make My wound healed him seven days." At worst his pain is a 2/10 in regard to the left gluteal region but this is mainly with cleansing of the wound. 10/21/17; his right dorsal foot wound is just about closed. The area on the right anterior shin area is closed. He has a left gluteal ulcer which is probably a pressure ulcer. He is been using his own creams that he spot on the Internet. This wound is also better. 11/04/17; this is a patient with severe PAD who is been revascularized by vein and  vascular. He had 2 difficult wound areas one on the right anterior shin and the right anterior  dorsal foot. More recently he has developed a left gluteal ulcer which is probably a pressure ulcer although it has some atypical nature to it. His proximal right leg has remained healed. He has a small open area on the right dorsal foot with a nonviable surface 11/18/17; this is a patient with severe PAD who is been revascularized by vein and vascular. The area on the right proximal leg has healed over. He has a very small ischemic wound on the right dorsal foot which was the second wound. He also had a pressure ulcer on his buttock which is healed out as of today 12/02/17; wound on the dorsal foot which I think is largely ischemic. He is upset with me for doing a surface debridement on this last visit however he still has a nonviable surface on this and I think this is largely a blood flow issue 12/16/17; wound on the dorsal foot which I think is largely ischemic. He sees Dr. Lita Mains ham again next week. We've been using Iodoflex to this not much of a healthy surface. 12/30/17; the patient went back to see vascular surgery. He had noninvasive testing. His ABI on the right was 0.70 compared to 0.60 last time on the left 0.57 compared to 0.44. TBI's were 0.3 on the right 0.29 on the left essentially unchanged from last time. Waveforms were monophasic. He was seen by a provider. Noted nonpalpable pulses. Overall they felt that they had slightly improved bilateral ABIs and right TBI. Her disease bilaterally with all waveforms monophasic. They will follow his lower extremity arterial duplex in 6 months. They're calling the areas on his feet venous stasis ulcers. He does not clearly describe claudication 01/13/18; The patient's wound is slightly larger in width and nonviable surface. We've been using Iodoflex. He has home help changing the dressings. I have been reluctant to do debridement on this since he blames a  debridement for reopening his wound actually still think this is arterial even given recent arterial results and evaluation [see above) 01/27/18; using iodoflex. better surface. 02/10/18; using Iodoflex. Surface is some better. Changed him to silver collagen today 03/02/18 on evaluation today patient continues to show signs of slow but good progress in regard to his left lower extremity ulcer. He has been tolerating the dressing changes without complication which is good news. He does have somewhat limited/poor vascular flow but nonetheless he does seem to be healing just at a much slower place and what would be otherwise recommended and expected. I do believe the silver collagen dressing change has been of benefit. 03/22/18; slow but good progress in terms of his right anterior foot wound. These are largely ischemic wounds but he has been revascularized. He is continued with silver collagen 04/07/18 on evaluation today patient appears to be doing rather well in regard to the wound on the dorsal surface of his right foot. He's been tolerating the dressing changes without complication. Fortunately there does not appear to be the evidence of infection at this time which is great news. Overall I'm very pleased with how things seem to appear at this point. 04/21/18; dorsal right foot wound secondary to PAD. Wound is making progress by 2 mm. Surface of it is passable. He is been using silver collagen 05/05/18; we received a call from home health this week that the original proximal tibial area had reopened on the right. He now has 2 open areas including the one we've been watching for a long period on the foot. The new  open area seems to be right in the same place as the original wound previously was located 05/13/2018; wounds are both actually larger. I debrided these last week both wounds are bigger. The surface looks better I been using Iodoflex. 05/27/2018; the wounds are larger but the surface of them looks  better with Iodoflex. He has a follow-up with Dr. Myra Gianotti sometime in November. I tried to move these up we were not able to get an earlier appointment I will try to text him. 06/16/2018; the patient's wounds are larger but with a generally cleaner surface. He has extensive set of investigations with Dr. Myra Gianotti on November 5. This includes ABIs TBI's and I believe an ultrasound of the stent. 07/04/2018; the patient apparently stated he felt too dizzy to go for his arterial studies on 11/5 therefore he canceled him. He has not been able to rebook them. I suspect these are ischemic wounds and I told him this. 09/01/2018; he is not been here in almost 2 months. He has been getting his dressing changed by [facility] I believe silver collagen. They are discharging him next week due to nonimprovement of the wounds predominantly. He did not follow-up with vascular surgery he was supposed to have follow-up arterial studies noninvasive. I am not sure that they have been rebooked and I do not think he has been seen. 1/23; patient's wounds are slightly larger. I believe this is ischemic. He canceled his last follow-up appointment with Dr. Myra Gianotti him that included noninvasive arterial studies. He says this was because of vertigo. He has not rescheduled these. He also has lost home health because of the chronicity of his wounds 2/6; the patient's wound on his right anterior tibia actually looks quite good. Not much difference in size but the surface of the wound looks good. He has a small area on the dorsal foot debris on the surface of this which I debrided with Anasept and gauze. He has not made a follow-up with vein and vascular 2/20; the patient's wound on his right anterior tibia continues to look good although it is not coming down in size much. The area on his right dorsal foot is about the same. Tightly adherent debris. The wound surface does not look healthy care. The patient is expressed his to sustain for  mechanical debridement in the past, I therefore been avoiding this at least until he goes back for repeat vascular appointment. So far he has not done this he is mingling for ENT appointments related to vertigo 3/5; right anterior tibia continues to look satisfactory perhaps slightly smaller. The area on the right dorsal foot is smaller. We have been using Hydrofera Blue. 4/16; the patient wounds have actually done well in the hiatus since we have last seen him. He is using Hydrofera Blue ready 6/25; we have not seen this patient in over 2 months as he has been in the isolation phase for the Covid epidemic. He is using Hydrofera Blue ready. He has 2 wounds right on the right anterior mid tibia and one on the right dorsal foot. Both of these are somewhat improved today. Especially the dorsal 1 over the tibia. He has not been for follow-up arterial studies but I have got him to agree to allow [place] to set these up. I 7/16; the patient's wound proximally in the mid tibia area has closed over once again. We had this closed over before and he reopened the area on the dorsal foot appears better. He has been using Hydrofera  Blue. We do not have an appointment for the follow-up arterial studies I ordered nor do I think he is really all that interested in getting them done. 7/30; patient has been using Hydrofera Blue. The dorsal foot wound looks some better however he has had a reopening in the mid tibia area. This is been closed over the last time. The opening is very superficial. We have been trying to get him follow-up vascular studies done with [facility] for about a year. He refused to do these at one point because he was at work getting worked up for vertigo. He is now more ready to move forward with this. 8/20; patient is using Hydrofera Blue. He has not made an appointment for his follow-up noninvasive vascular studies and I have talked to him about this again. Both wound areas are still open on  the right mid tibia and the right dorsal foot although I think they are smaller 05/04/19-Patient is back after his vascular visit his studies revealed that he is got ABI of 0.6 on the right, 0.46 on the left, monophasic flow to both right and left anterior tibial arteries is got great pressures to the right great toe. 10/8; we still do not have a vascular surgery appointment 1 month later. I think he may require a repeat angiogram his wounds on the right anterior to mid tibia is quite a bit larger the area on the right dorsal ankle, is small. Very little open area here 11/12; patient has not been here in over a month. He still did not have a vascular surgery appointment however before he left the clinic today we managed to arrange that for 12/9. The area on his right dorsal foot is surprisingly closed over today however the area in the mid tibia is quite a bit larger than when we saw this last time. 12/3; patient has a vascular appointment next week in follow-up from his previous revascularization. The area on the right dorsal foot remains closed. He still has a fairly sizable wound on the anterior tibia but it is superficial. We have been using silver collagen 12/28. The patient saw Langston Reusing the nurse practitioner at [facility]. Noted that the most recent ABIs were in September 2020 which showed a mild decline bilaterally a 0.61 on the right with monophasic waveforms and 0.46 on the left with monophasic waveforms. It was felt that the area on his right lower leg was a venous ulcer. The area on the dorsal foot remains closed. Follow-up ABIs were ordered for 6 months. We have been ordering silver collagen 1/28; monthly follow-up. Not much change in the wound on the anterior mid tibia. We have been using silver collagen for a prolonged period of time now. He has been reviewed by vascular with regards to his known PAD. They did not think that he needed any further interventions for now and  ordered follow-up ABIs in 6 months. We did manage to heal the area on the dorsal foot and that remains closed 2/25; monthly follow-up. The wound on the right anterior mid tibia is better. We changed to polymen last time there is less surrounding maceration and the wound bed looks healthy. 3/18; monthly follow-up. The wound on the right anterior mid tibia is better smaller we've been using PolyMem 4/8; patient with a wound on the right anterior tibia area. We have been using polymen. This is very close to being healed 4/29; right anterior tibia wound has once again healed. The more  distal wound on his dorsal foot is also healed he has known PAD has been revascularized. Followed up at [facility] late in December he has follow-up arterial studies 6 months after that probably sometime in June Electronic Signature(s) Signed: 12/21/2019 5:41:26 PM By: Baltazar Najjar MD Entered By: Baltazar Najjar on 12/21/2019 15:44:50 -------------------------------------------------------------------------------- Physical Exam Details Patient Name: Date of Service: Sherry Ruffing. 12/21/2019 2:00 PM Medical Record Number: [id number] Patient Account Number: [id number] Date of Birth/Sex: Treating RN: September 20, 1950 (69 y.o. Reginald Ayers Primary Care Provider: Katy Apo Other Clinician: Referring Provider: Treating Provider/Extender: Demetria Pore in Treatment: 197 Constitutional Patient is hypertensive.. Pulse regular and within target range for patient.Marland Kitchen Respirations regular, non-labored and within target range.. Temperature is normal and within the target range for the patient.Marland Kitchen Appears in no distress. Cardiovascular There is palpable pulses in his foot that are reduced but present. Notes Wound exam; he has faint peripheral pulses on the right dorsalis pedis. The oval-shaped wound on the right anterior tibial area is finally closed over although it looks somewhat  vulnerable. The area on his dorsal foot was closed 2 or 3 months ago Electronic Signature(s) Signed: 12/21/2019 5:41:26 PM By: Baltazar Najjar MD Entered By: Baltazar Najjar on 12/21/2019 15:45:47 -------------------------------------------------------------------------------- Physician Orders Details Patient Name: Date of Service: Sherry Ruffing. 12/21/2019 2:00 PM Medical Record Number: [id number] Patient Account Number: [id number] Date of Birth/Sex: Treating RN: 11-03-1950 (69 y.o. Reginald Ayers Primary Care Provider: Katy Apo Other Clinician: Referring Provider: Treating Provider/Extender: Demetria Pore in Treatment: 197 Verbal / Phone Orders: No Diagnosis Coding ICD-10 Coding Code Description [phone] Non-pressure chronic ulcer of right calf with necrosis of muscle E11.622 Type 2 diabetes mellitus with other skin ulcer E11.51 Type 2 diabetes mellitus with diabetic peripheral angiopathy without gangrene [facility]charge [facility] - call if any future wound care needs. Secondary Dressing Foam Border - to healed area x2 weeks for protection. Edema Control Other: - patient to pick up support stockings/hose 10-58mmHg. Apply in the am and remove at night. Electronic Signature(s) Signed: 12/21/2019 5:33:37 PM By: Shawn Stall Signed: 12/21/2019 5:41:26 PM By: Baltazar Najjar MD Entered By: Shawn Stall on 12/21/2019 14:57:50 -------------------------------------------------------------------------------- Problem List Details Patient Name: Date of Service: Sherry Ruffing 12/21/2019 2:00 PM Medical Record Number: [id number] Patient Account Number: [id number] Date of Birth/Sex: Treating RN: 09-13-1950 (69 y.o. Reginald Ayers Primary Care Provider: Katy Apo Other Clinician: Referring Provider: Treating Provider/Extender: Demetria Pore in Treatment: 197 Active  Problems ICD-10 Encounter Code Description Active Date MDM Diagnosis L97.213 Non-pressure chronic ulcer of right calf with necrosis of muscle 03/06/2016 No Yes E11.622 Type 2 diabetes mellitus with other skin ulcer 03/06/2016 No Yes E11.51 Type 2 diabetes mellitus with diabetic peripheral angiopathy without gangrene 03/06/2016 No Yes Inactive Problems ICD-10 Code Description Active Date Inactive Date E11.621 Type 2 diabetes mellitus with foot ulcer 11/19/2016 11/19/2016 L97.512 Non-pressure chronic ulcer of other part of right foot with fat layer exposed 03/03/2018 03/03/2018 Resolved Problems ICD-10 Code Description Active Date Resolved Date L97.221 Non-pressure chronic ulcer of left calf limited to breakdown of skin 03/06/2016 03/06/2016 L97.312 Non-pressure chronic ulcer of right ankle with fat layer exposed 07/15/2016 07/15/2016 L89.322 Pressure ulcer of left buttock, stage 2 09/09/2017 09/09/2017 Electronic Signature(s) Signed: 12/21/2019 5:41:26 PM By: Baltazar Najjar MD Entered By: Baltazar Najjar on 12/21/2019 15:41:29 -------------------------------------------------------------------------------- Progress Note Details Patient  Name: Date of Service: Reginald Ayers, Reginald Ayers 12/21/2019 2:00 PM Medical Record Number: [id number] Patient Account Number: [id number] Date of Birth/Sex: Treating RN: 1951/08/01 (69 y.o. Reginald Ayers Primary Care Provider: Katy Apo Other Clinician: Referring Provider: Treating Provider/Extender: Demetria Pore in Treatment: 197 Subjective History of Present Illness (HPI) 03/06/16; this is a patient who is a type II diabetic with a recent hemoglobin A1c of 7.6. He is referred from Dr. Allyson Sabal for aggressive wound care on a wound on his right anterior leg. He has known severe PAD and has an extensive interventional history. His left common iliac artery was stented in December 2003 and his right superficial femoral artery in April 2008.  He has significant infrapopliteal disease bilaterally and has known occluded left SFA. His ABIs from September 2016 showed a ABI of 0.48 on the right and 0.52 on the left although he does not really describe claudication. He also has known carotid artery disease, coronary artery disease and is status post CABG in 2010. He tells me he smokes 3 cigarettes a day. The patient tells me his at the wound on the right anterior leg for about 6 months. He has a smaller area on the left medial leg that has been present for one month. He's been to see his primary physician Dr. polite and Dr. Allyson Sabal I am not clear what he is been using on this. It is felt that his ulcer is an ischemic ulcer on the pretibial region. It is noted that if we are unable to heal this it was felt he would need repeat angiography and potential percutaneous revascularization. 03/13/16; the area on the left medial leg is healed over. His ischemic wound on the right anterior leg is debridement of a lot of nonviable subcutaneous tissue we've been using Santyl 03/20/16; he has no open area on the left medial leg. The wound on the right anterior leg looks ischemic. A lot of nonviable tissue. Our intake nurse reported that he had a lot of dried blood clots around the wound presumably from last week's debridement. There is no evidence of infection 04/16/16; the area on the right anterior leg is an ischemic wound. He is been back to see Dr. Allyson Sabal who states he is not a candidate for revascularization. He has a large area of occlusion from his groin down which cannot be revascularized. Since Dr. Allyson Sabal is aggressive with this type of situation I can only surmised that that's the correct decision 05/01/16 the area on the right leg is an ischemic wound. He has not a candidate for revascularization. He has not smoked in 48 hours. Has been using Santyl wound size is not any smaller however this surface looks somewhat better 05/08/16; ischemic wound on the  right anterior leg. He is not a candidate for revascularization. He continues with nonsmoking for the last week. I use Prisma last week although that probably wasn't a good decision, covered in a difficult surface eschar today requiring debridement 05/15/16; patient with ischemic wound on his right anterior leg. He is not a candidate for revascularization. Complains of a lot of pain this week/burning over the wound area making it difficult for him to sleep 05/21/16; patient states less pain this week. The wound had circumferential fibrinous slough that I removed along with some nonviable subcutaneous tissue. We used Hydrofera Blue last week 05/28/16; patient states he has not had any pain. Some minor reduction in the area. Still using Hydrofera Blue 06/04/16; no pain  still using Hydrofera Blue 06/11/16 no complaints still using Hydrofera Blue 06/18/16; I changed him to Endoform last week. 06/25/16; wound still appears healthy. Minor debridement this week still using Endoform 07/02/16; the wound bed certainly appears healthier. Rim of epithelialization superiorly was there last week. It certainly has not progressed any further. Using Endoform 07/09/16 patient is here for a review of the wound on the right anterior leg. He has a history of severe and prolonged PAD followed by Dr. Allyson SabalBerry in the setting of type 2 diabetes. I have been using Endoform for the last 3 or 4 weeks. He has a rim of epithelialization superiorly but I have not been able to advance this. He complains of pain but there is no clear infection 07/15/2016 -- the patient was here for a nurse visit but due to the presence of a new wound I was asked to see him for an opinion 07/23/16; the patient was seen last week by Dr. Meyer RusselBritto in my absence. He had a new wound on his right dorsal ankle. Santyl was applied to this new area and also his original wound on the right anterior tibia. He arrives today complaining of a lot of pain 07/30/16; the  patient has new wound on his right dorsal ankle from 2 weeks ago. This is as usual covered with a thick eschar. I think this is ischemic and I'm not expecting to be able to heal this. Santyl was applied for but denied by his insurance we will therefore use Medihoney. He only obtained this later today. He has left a wrap on all week with Santyl. 08/06/16 both wounds in the same situation still a gelatinous surface slough. He finds the Medihoney makes him feel pain. He could not afford this Santyl opiate 08/20/16; patient arrives today frustrated about his nonhealing wounds now o2 on his right leg. He arrived here with a wound on the right anterior leg mid aspect and is developed a second area on the right dorsal ankle. I have reviewed Dr. Hazle CocaBerry's notes and his visit from August/17. There he makes clear reference to the fact that the vessels were not revascularizable. The patient also describes fairly clear claudication at rest which makes him sleep with his legs in a dependent position. He has given up smoking as of yesterday 09/03/16; patient is going for a second opinion on his vascular status of [facility] vascular next week. We have been using Medihoney to the wounds. He is tolerating his discomfort. 09/17/16; the patient is going to have an angiogram on January 30. Miraculously I can't really see has had a recent angiogram although he has been to see Dr. Allyson SabalBerry who does not think here is cerebrovascular rise visible option. He does have stents in both legs that Dr. Allyson SabalBerry put and I think about 9 years ago. I would like to continue to use Medihoney until we see the results of his angiogram. 10/01/16; the patient had his angiogram. He has stent previously which was dilated and then a stent was placed in the right superficial femoral artery. He had failed balloon angioplasty of the right posterior tibial artery. A further stent was placed in the superficial femoral artery. An angioplasty was also done. He  was noted to have severe stenosis versus occlusion of the posterior tibial artery which is the dominant vessel across the foot. Intervention was attempted here but was unsuccessful. If the patient is unable to heal his wounds it was felt he could be brought back for repeat attempt  10/15/16- patient is here for follow-up evaluation of his right lower extremity and right dorsal foot ulcers. He was unable to make last week's appointment secondary to a death in the family. He has been using Iodosorb/Iodoflex dressing changes per home health. He states that his primary care physician follows an A1c every 3 months, with his last A1c being 7.8 which is elevated from the previous. He states he has a scheduled A1c for next month. He also states he has a follow-up appointment with [facility] in April 10/22/16- Mr. Encinas did have an xray of the right foot, revealing no definite evidence of bone destruction to suggest osteomyelitis. This was performed at [facility]. He has been wearing his show with a rolled tongue since the last visit when he leaves the home, otherwise he wears loose house slippers around the house. He is voicing no complaints or concerns since his last visit. He admits to smoking "maybe one a week" 10/29/16- patient is here for follow-up evaluation of his right lower extremity and right dorsal foot ulcers. He states that he has completely stopped smoking but does admit to dipping tobacco. We have not found out his coverage for both [employer] and Apligraf. He continues to offload this area with his walking shoe otherwise wears a slipper at home. He brought this (and it does appear to allow for relief of pressure to the dorsal part of his foot. 11/05/16; patient is here for follow-up of his right lower extremity and right dorsal foot ulcers. He has been revascularized. He no longer smokes. We have been using Prisma. 11/12/16- Mr. Paprocki arrives for follow up evaluation of his right lower  extremity ulcers. We have been using Prisma. He is planned for an Apligraf application today. 11/19/16- Mr.Ahlberg arrives for follow-up evaluation of his right lower extremity and right dorsal foot ulcer one week after Apligraf #1 application. He states the home health came out on Monday and change the foam, Kerlix and Coban and and they did not interrupt the Adaptic touch. He has a follow-up appointment with ultrasound on Monday with vein and vascular 11/26/16- he is here for follow-up evaluation. He is s/p Apligraf #1 application 2 weeks ago. he had an appointment with vascular on Monday for arterial ultrasound. In evaluation of the stent to the right SFA which indicates biphasic waveform proximal to the stent and at the origin of the stent otherwise monophasic flow throughout the stent and at the distal aspect of the stent. An ABI done in that office on Monday was 0.73 (previously 0.47). He has biphasic flow in the right posterior tibial and monophasic flow in the dorsalis pedis. He has a follow-up with Dr. Myra Gianotti on Monday, 4/9 12/03/16- He is here for follow-up evaluation. He did see Dr. Myra Gianotti on Monday and has a scheduled aortogram with lower extremity arteriogram on the 17th. He was not compressed over the last week. His wound culture came back Oxacillin sensitive staph aureus. He was prescribed Keflex and has been taking that without any voiced complaints. He states that he started taking his Lasix, although he did not contact or have an appointment with Dr. Nehemiah Settle. He does have an appointment in May 12/10/16- he is here for follow-up evaluation. He had an intervention on 4/17 with Dr. Myra Gianotti. According to his dictation 1) the patient has a widely patent right SFA and popliteal stent 2) after the an 80% occlusion of the right PTA was treated with atherectomy and balloon angioplasty with a residual stenosis  of less than 5%. The patient is voicing significant improvement and overall feeling; states  that he can now sleep in the bed without any symptoms. His completed his Keflex with no adverse reaction. He continues to self-regulate his Lasix, he is not on any potassium supplementation. 12/17/16; Arrives today with weeping edema thru the dorsal foot wound and lower leg. He has not been using his Lasix and did not have compression on his leg. According to the patient he has  Lasix at home which he takes prn but not in weeks 12/21/16; less edema and no weeping edema. Apligraf #1 12/31/16; both wounds look somewhat better especially the area distally. Apligraf #2 01/14/17; Apligraf #3. Wounds looked improved. Patient saw main and vascular. ABIs on 01/11/17 done in their office showed a patent SFA stent status post atherectomy with angioplasty of the right PTA [12/08/16] biphasic flow on the right 0.95 monophasic flow on the left 0.45. 01/28/17; Apligraf #4. Wounds not particularly better than I remember. 02/11/17; Apligraf #5 the wounds have some improvement. The distal 1 has had some epithelialization proximal one is somewhat smaller. 02/25/17; he arrived in clinic today with a disappointing amount of surface necrotic material. This is especially true on the distal anterior foot/ankle wound. Change primary dressing the Hydrofera Blue today 03/04/17; some improvement in the proximal wound on the tibia. The distal wound on the dorsal foot/ankle has too much necrotic surface. We have been using Hydrofera Blue although there was apparently some problem with obtaining it through his home health company however the patient reports he now has it 03/11/17; wounds measure slightly smaller. Surface looks questionable although with the improvement in dimensions will likely continue with the [facility] without debridement today 03/18/17; no major change in these wounds. Questionable surface. Debridement today. Using [facility] 03/25/17; the patient has follow-up noninvasive vascular tests with vein and vascular  later this month. The wounds himself are not measuring any different however the proximal wound bed looks better with the Iodoflex not much change with the area over the dorsal foot. 04/22/17; patient had repeat angiography on 04/20/17. He had a drug-coated balloon angioplasty of the right superficial femoral artery balloon angioplasty right common femoral artery. He had stenosis of previously placed stents within the right superficial femoral artery this was treated with drug coated balloon angioplasty. He continues to have single vessel runoff via the peroneal artery He has been using Hydrofera Blue 04/29/17; no major change this week. Wound beds appear healthy. No debridement. He apparently did not tolerate Iodoflex which I was using prior to modification. I'm going to change him to Endo form with hydrogel 05/13/17; wound beds again appear a lot better, paradoxically the wound on the dorsal foot appears to be progressing better than the proximal wound. Using Endoform with hydrogel 06/03/17; doing well. wounds smaller especially the wound on the dorsal foot. using endoform 06/17/17; two-week follow-up everything is going really nicely here. Using Endoform he only has one small open area left on the right dorsal foot/ankle. Also improved on the right anterior leg. He is retired from smoking since starting with [place] in this clinic 07/01/17; two-week follow-up the area on his right anterior foot/ankle is just about closed. The area superiorly on the lower mid calf as adherent necrotic debris which will need debridement but it is also down in terms of dimensions. We'll use Endoform 07/19/17 on evaluation today patient appears to be doing well with his Current wound care measures and there does not appear to  be necrotic tissue which is good news. The Endoform however does seem to be causing some epithelialization although this is loose at the proximal/12 o'clock location and seems to be pulling off with  dressing changes. No fevers, chills, nausea, or vomiting noted at this time. He has no significant discomfort. 08/05/17; the patient has 2 small open areas on the proximal foot and a larger wound on the right anterior leg. Change to [facility] Blue last time probably because of the hyper granulation proximally 08/26/17; the patient has 2 small open areas on the proximal foot wound which is closed. The other is covered with a thin surface eschar. There is a similar wound on the right anterior leg. We've been using Hydrofera Blue 09/09/17*; the patient went for angiogram with Dr. Myra Gianotti. Apparently a routine scheduled arterial duplex suggested occlusion of his stent however with the angiography the stent was widely patent. No procedure was necessary. He also apparently has had a long-standing pressure area on the left buttock which dates back to prior to Thanksgiving although he's never really said anything to anybody about this. He is been attempting to treated himself with triple antibiotic cream etc. We've been using silver collagen to the wounds on his right leg and dorsal right foot. These are largely unchanged today 09/23/17; patient's wounds are smaller in both areas and are just about closed. We have been using silver collagen he shows me an area on his buttock that is in the gluteal fold. This would be an atypical place for a pressure sore like a herpetic lesion although he says it's been there for a long time 2/14/19the patient's wound is totally closed. The area on the dorsal foot is also close to closing. I looked again at the area on his left buttock in the gluteal fold. He tells me that this is been there for 6 months. This more looks more like a pressure ulcer to me than it did when I looked at this 2 weeks ago. I simply put topical antibiotics on this but we are going to have to change to a more formal dressing 10/13/17 on evaluation today patient appears to be doing very well in regard to  his right dorsal foot wound. He has been tolerating the dressing changes without complication. Fortunately there does not appear to be any evidence of infection which is good news. He continues to have a left gluteal ulcer as well as he has been tolerating the Prisma dressing on although this is not significantly smaller it does sound like he's been putting other things on this as well including some kind of frame he in fact this order another cream as well although I'm not exactly sure what this is. It's something that he "researched on the Internet that is supposed to make My wound healed him seven days." At worst his pain is a 2/10 in regard to the left gluteal region but this is mainly with cleansing of the wound. 10/21/17; his right dorsal foot wound is just about closed. The area on the right anterior shin area is closed. He has a left gluteal ulcer which is probably a pressure ulcer. He is been using his own creams that he spot on the Internet. This wound is also better. 11/04/17; this is a patient with severe PAD who is been revascularized by [facility]. He had 2 difficult wound areas one on the right anterior shin and the right anterior dorsal foot. More recently he has developed a left gluteal ulcer  which is probably a pressure ulcer although it has some atypical nature to it. His proximal right leg has remained healed. He has a small open area on the right dorsal foot with a nonviable surface 11/18/17; this is a patient with severe PAD who is been revascularized by [facility]. The area on the right proximal leg has healed over. He has a very small ischemic wound on the right dorsal foot which was the second wound. He also had a pressure ulcer on his buttock which is healed out as of today 12/02/17; wound on the dorsal foot which I think is largely ischemic. He is upset with me for doing a surface debridement on this last visit however he still has a nonviable surface on this and I  think this is largely a blood flow issue 12/16/17; wound on the dorsal foot which I think is largely ischemic. He sees Dr. Kettering Sink ham again next week. We've been using Iodoflex to this not much of a healthy surface. 12/30/17; the patient went back to see vascular surgery. He had noninvasive testing. His ABI on the right was 0.70 compared to 0.60 last time on the left 0.57 compared to 0.44. TBI's were 0.3 on the right 0.29 on the left essentially unchanged from last time. Waveforms were monophasic. He was seen by a provider. Noted nonpalpable pulses. Overall they felt that they had slightly improved bilateral ABIs and right TBI. Her disease bilaterally with all waveforms monophasic. They will follow his lower extremity arterial duplex in 6 months. They're calling the areas on his feet venous stasis ulcers. He does not clearly describe claudication 01/13/18; The patient's wound is slightly larger in width and nonviable surface. We've been using Iodoflex. He has home help changing the dressings. I have been reluctant to do debridement on this since he blames a debridement for reopening his wound actually still think this is arterial even given recent arterial results and evaluation [see above) 01/27/18; using iodoflex. better surface. 02/10/18; using Iodoflex. Surface is some better. Changed him to silver collagen today 03/02/18 on evaluation today patient continues to show signs of slow but good progress in regard to his left lower extremity ulcer. He has been tolerating the dressing changes without complication which is good news. He does have somewhat limited/poor vascular flow but nonetheless he does seem to be healing just at a much slower place and what would be otherwise recommended and expected. I do believe the silver collagen dressing change has been of benefit. 03/22/18; slow but good progress in terms of his right anterior foot wound. These are largely ischemic wounds but he has been revascularized.  He is continued with silver collagen 04/07/18 on evaluation today patient appears to be doing rather well in regard to the wound on the dorsal surface of his right foot. He's been tolerating the dressing changes without complication. Fortunately there does not appear to be the evidence of infection at this time which is great news. Overall I'm very pleased with how things seem to appear at this point. 04/21/18; dorsal right foot wound secondary to PAD. Wound is making progress by 2 mm. Surface of it is passable. He is been using silver collagen 05/05/18; we received a call from home health this week that the original proximal tibial area had reopened on the right. He now has 2 open areas including the one we've been watching for a long period on the foot. The new open area seems to be right in the same place  as the original wound previously was located 05/13/2018; wounds are both actually larger. I debrided these last week both wounds are bigger. The surface looks better I been using Iodoflex. 05/27/2018; the wounds are larger but the surface of them looks better with Iodoflex. He has a follow-up with Dr. Myra Gianotti sometime in November. I tried to move these up we were not able to get an earlier appointment I will try to text him. 06/16/2018; the patient's wounds are larger but with a generally cleaner surface. He has extensive set of investigations with Dr. Myra Gianotti on November 5. This includes ABIs TBI's and I believe an ultrasound of the stent. 07/04/2018; the patient apparently stated he felt too dizzy to go for his arterial studies on 11/5 therefore he canceled him. He has not been able to rebook them. I suspect these are ischemic wounds and I told him this. 09/01/2018; he is not been here in almost 2 months. He has been getting his dressing changed by [facility] I believe silver collagen. They are discharging him next week due to nonimprovement of the wounds predominantly. He did not follow-up with  vascular surgery he was supposed to have follow-up arterial studies noninvasive. I am not sure that they have been rebooked and I do not think he has been seen. 1/23; patient's wounds are slightly larger. I believe this is ischemic. He canceled his last follow-up appointment with Dr. Myra Gianotti him that included noninvasive arterial studies. He says this was because of vertigo. He has not rescheduled these. He also has lost home health because of the chronicity of his wounds 2/6; the patient's wound on his right anterior tibia actually looks quite good. Not much difference in size but the surface of the wound looks good. He has a small area on the dorsal foot debris on the surface of this which I debrided with Anasept and gauze. He has not made a follow-up with vein [facility] vascular 2/20; the patient's wound on his right anterior tibia continues to look good although it is not coming down in size much. The area on his right dorsal foot is about the same. Tightly adherent debris. The wound surface does not look healthy care. The patient is expressed his to sustain for mechanical debridement in the past, I therefore been avoiding this at least until he goes back for repeat vascular appointment. So far he has not done this he is mingling for ENT appointments related to vertigo 3/5; right anterior tibia continues to look satisfactory perhaps slightly smaller. The area on the right dorsal foot is smaller. We have been using Hydrofera Blue. 4/16; the patient wounds have actually done well in the hiatus since we have last seen him. He is using Hydrofera Blue ready 6/25; we have not seen this patient in over 2 months as he has been in the isolation phase for the Covid epidemic. He is using Hydrofera Blue ready. He has 2 wounds right on the right anterior mid tibia and one on the right dorsal foot. Both of these are somewhat improved today. Especially the dorsal 1 over the tibia. He has not been for follow-up arterial  studies but I have got him to agree to allow [place] to set these up. I 7/16; the patient's wound proximally in the mid tibia area has closed over once again. We had this closed over before and he reopened the area on the dorsal foot appears better. He has been using Hydrofera Blue. We do not have an appointment for the follow-up  arterial studies I ordered nor do I think he is really all that interested in getting them done. 7/30; patient has been using Hydrofera Blue. The dorsal foot wound looks some better however he has had a reopening in the mid tibia area. This is been closed over the last time. The opening is very superficial. We have been trying to get him follow-up vascular studies done with [facility] for about a year. He refused to do these at one point because he was at work getting worked up for vertigo. He is now more ready to move forward with this. 8/20; patient is using Hydrofera Blue. He has not made an appointment for his follow-up noninvasive vascular studies and I have talked to him about this again. Both wound areas are still open on the right mid tibia and the right dorsal foot although I think they are smaller 05/04/19-Patient is back after his vascular visit his studies revealed that he is got ABI of 0.6 on the right, 0.46 on the left, monophasic flow to both right and left anterior tibial arteries is got great pressures to the right great toe. 10/8; we still do not have a vascular surgery appointment 1 month later. I think he may require a repeat angiogram his wounds on the right anterior to mid tibia is quite a bit larger the area on the right dorsal ankle, is small. Very little open area here 11/12; patient has not been here in over a month. He still did not have a vascular surgery appointment however before he left the clinic today we managed to arrange that for 12/9. The area on his right dorsal foot is surprisingly closed over today however the area in the mid tibia is  quite a bit larger than when we saw this last time. 12/3; patient has a vascular appointment next week in follow-up from his previous revascularization. The area on the right dorsal foot remains closed. He still has a fairly sizable wound on the anterior tibia but it is superficial. We have been using silver collagen 12/28. The patient saw Langston Reusing the nurse practitioner at [facility]. Noted that the most recent ABIs were in September 2020 which showed a mild decline bilaterally a 0.61 on the right with monophasic waveforms and 0.46 on the left with monophasic waveforms. It was felt that the area on his right lower leg was a venous ulcer. The area on the dorsal foot remains closed. Follow-up ABIs were ordered for 6 months. We have been ordering silver collagen 1/28; monthly follow-up. Not much change in the wound on the anterior mid tibia. We have been using silver collagen for a prolonged period of time now. He has been reviewed by vascular with regards to his known PAD. They did not think that he needed any further interventions for now and ordered follow-up ABIs in 6 months. We did manage to heal the area on the dorsal foot and that remains closed 2/25; monthly follow-up. The wound on the right anterior mid tibia is better. We changed to polymen last time there is less surrounding maceration and the wound bed looks healthy. 3/18; monthly follow-up. The wound on the right anterior mid tibia is better smaller we've been using PolyMem 4/8; patient with a wound on the right anterior tibia area. We have been using polymen. This is very close to being healed 4/29; right anterior tibia wound has once again healed. The more distal wound on his dorsal foot is also healed he has  known PAD has been revascularized. Followed up at [facility] late in December he has follow-up arterial studies 6 months after that probably sometime in June Objective Constitutional Patient is hypertensive..  Pulse regular and within target range for patient.Marland Kitchen Respirations regular, non-labored and within target range.. Temperature is normal and within the target range for the patient.Marland Kitchen Appears in no distress. Vitals Time Taken: 2:35 PM, Height: 61 in, Weight: 122 lbs, BMI: 23, Temperature: 98.3 F, Pulse: 78 bpm, Respiratory Rate: 16 breaths/min, Blood Pressure: 160/68 mmHg. Cardiovascular There is palpable pulses in his foot that are reduced but present. General Notes: Wound exam; he has faint peripheral pulses on the right dorsalis pedis. The oval-shaped wound on the right anterior tibial area is finally closed over although it looks somewhat vulnerable. The area on his dorsal foot was closed 2 or 3 months ago Integumentary (Hair, Skin) Wound #5R status is Open. Original cause of wound was Gradually Appeared. The wound is located on the Right,Anterior Lower Leg. The wound measures 0cm length x 0cm width x 0cm depth; 0cm^2 area and 0cm^3 volume. There is no tunneling or undermining noted. There is a Reginald Ayers present amount of drainage noted. The wound margin is flat and intact. There is no granulation within the wound bed. There is no necrotic tissue within the wound bed. Assessment Active Problems ICD-10 Non-pressure chronic ulcer of right calf with necrosis of muscle Type 2 diabetes mellitus with other skin ulcer Type 2 diabetes mellitus with diabetic peripheral angiopathy without gangrene Plan Discharge From [facility] Services: Discharge from [facility] Center - call if any future wound care needs. Secondary Dressing: Foam Border - to healed area x2 weeks for protection. Edema Control: Other: - patient to pick up support stockings/hose 10-3mmHg. Apply in the am and remove at night. 1. We simply put a border foam on this area asked him to continue to put a thick Band-Aid on this area for protection 2. I asked him to get support stockings again to control swelling and to protect the area. He said he  would look into it 3. He can be discharged from the clinic. I believe at one point in time we did discharge him previously and he reopened however hopefully this time he will maintain skin integrity. He will follow up with [facility] and vascular hopefully in June for noninvasive arterial studies follow-up Electronic Signature(s) Signed: 12/21/2019 5:41:26 PM By: Baltazar Najjar MD Entered By: Baltazar Najjar on 12/21/2019 15:48:12 -------------------------------------------------------------------------------- SuperBill Details Patient Name: Date of Service: Sherry Ruffing 12/21/2019 Medical Record Number: [id number] Patient Account Number: [id number] Date of Birth/Sex: Treating RN: 1951-08-20 (69 y.o. Reginald Ayers Primary Care Provider: Katy Apo Other Clinician: Referring Provider: Treating Provider/Extender: Demetria Pore in Treatment: 197 Diagnosis Coding ICD-10 Codes Code Description [id number] Non-pressure chronic ulcer of right calf with necrosis of muscle E11.622 Type 2 diabetes mellitus with other skin ulcer E11.51 Type 2 diabetes mellitus with diabetic peripheral angiopathy without gangrene Facility Procedures The patient participates with Medicare or their insurance follows the Medicare Facility Guidelines: CPT4 Code Description Modifier Quantity [id number] [phone] - WOUND CARE VISIT-LEV 3 EST PT 1 Physician Procedures : CPT4 Code Description Modifier [id number] - WC PHYS LEVEL 3 - EST PT ICD-10 Diagnosis Description L97.213 Non-pressure chronic ulcer of right calf with necrosis of muscle E11.622 Type 2 diabetes mellitus with other skin ulcer Quantity: 1 Electronic Signature(s) Signed: 12/21/2019 5:41:26 PM By: Baltazar Najjar MD Entered By: Baltazar Najjar on  12/21/2019 15:48:32 

## 2020-01-15 NOTE — Progress Notes (Signed)
HAMPTON, WIXOM ([id number]) Visit Report for 12/21/2019 Arrival Information Details Patient Name: Date of Service: Reginald Ayers, Reginald Ayers 12/21/2019 2:00 PM Medical Record Number: [id number] Patient Account Number: [id number] Date of Birth/Sex: Treating RN: 1951/01/03 (69 y.o. Reginald Ayers Primary Care Reginald Ayers: Katy Apo Other Clinician: Referring Reginald Ayers: Treating Reginald Ayers/Extender: Demetria Pore in Treatment: 197 Visit Information History Since Last Visit Added or deleted any medications: No Patient Arrived: Reginald Ayers Any new allergies or adverse reactions: No Arrival Time: 14:34 Had a fall or experienced change in No Accompanied By: alone activities of daily living that may affect Transfer Assistance: None risk of falls: Patient Identification Verified: Yes Signs or symptoms of abuse/neglect since last visito No Secondary Verification Process Completed: Yes Hospitalized since last visit: No Patient Requires Transmission-Based Precautions: No Implantable device outside of the clinic excluding No Patient Has Alerts: Yes cellular tissue based products placed in the center Patient Alerts: Patient on Blood Thinner since last visit: Has Dressing in Place as Prescribed: Yes Pain Present Now: No Electronic Signature(s) Signed: 12/21/2019 5:31:56 PM By: Reginald Abts RN, BSN Entered By: Reginald Ayers on 12/21/2019 14:35:03 -------------------------------------------------------------------------------- Clinic Level of Care Assessment Details Patient Name: Date of Service: Reginald Ayers, Reginald Ayers 12/21/2019 2:00 PM Medical Record Number: [id number] Patient Account Number: [id number] Date of Birth/Sex: Treating RN: 1950/12/26 (69 y.o. Reginald Ayers Primary Care Reginald Ayers: Katy Apo Other Clinician: Referring Reginald Ayers: Treating Reginald Ayers/Extender: Demetria Pore in Treatment: 197 Clinic Level of Care Assessment Items TOOL 4  Quantity Score X- 1 0 Use when only an EandM is performed on FOLLOW-UP visit ASSESSMENTS - Nursing Assessment / Reassessment X- 1 10 Reassessment of Co-morbidities (includes updates in patient status) X- 1 5 Reassessment of Adherence to Treatment Plan ASSESSMENTS - Wound and Skin A ssessment / Reassessment X - Simple Wound Assessment / Reassessment - one wound 1 5 []  - 0 Complex Wound Assessment / Reassessment - multiple wounds X- 1 10 Dermatologic / Skin Assessment (not related to wound area) ASSESSMENTS - Focused Assessment X- 1 5 Circumferential Edema Measurements - multi extremities X- 1 10 Nutritional Assessment / Counseling / Intervention []  - 0 Lower Extremity Assessment (monofilament, tuning fork, pulses) []  - 0 Peripheral Arterial Disease Assessment (using hand held doppler) ASSESSMENTS - Ostomy and/or Continence Assessment and Care []  - 0 Incontinence Assessment and Management []  - 0 Ostomy Care Assessment and Management (repouching, etc.) PROCESS - Coordination of Care X - Simple Patient / Family Education for ongoing care 1 15 []  - 0 Complex (extensive) Patient / Family Education for ongoing care X- 1 10 Staff obtains , Records, T Results / Process Orders est []  - 0 Staff telephones HHA, Nursing Homes / Clarify orders / etc []  - 0 Routine Transfer to another Facility (non-emergent condition) []  - 0 Routine Hospital Admission (non-emergent condition) []  - 0 New Admissions / / Ordering NPWT Apligraf, etc. , []  - 0 Emergency Hospital Admission (emergent condition) []  - 0 Simple Discharge Coordination []  - 0 Complex (extensive) Discharge Coordination PROCESS - Special Needs []  - 0 Pediatric / Minor Patient Management []  - 0 Isolation Patient Management []  - 0 Hearing / Language / Visual special needs []  - 0 Assessment of Community assistance (transportation, D/C planning, etc.) []  - 0 Additional assistance / Altered  mentation []  - 0 Support Surface(s) Assessment (bed, cushion, seat, etc.) INTERVENTIONS - Wound Cleansing / Measurement X - Simple Wound Cleansing - one wound 1  5 []  - 0 Complex Wound Cleansing - multiple wounds X- 1 5 Wound Imaging (photographs - any number of wounds) []  - 0 Wound Tracing (instead of photographs) X- 1 5 Simple Wound Measurement - one wound []  - 0 Complex Wound Measurement - multiple wounds INTERVENTIONS - Wound Dressings []  - 0 Small Wound Dressing one or multiple wounds []  - 0 Medium Wound Dressing one or multiple wounds []  - 0 Large Wound Dressing one or multiple wounds []  - 0 Application of Medications - topical []  - 0 Application of Medications - injection INTERVENTIONS - Miscellaneous []  - 0 External ear exam []  - 0 Specimen Collection (cultures, biopsies, blood, body fluids, etc.) []  - 0 Specimen(s) / Culture(s) sent or taken to Lab for analysis []  - 0 Patient Transfer (multiple staff / Civil Service fast streamer / Similar devices) []  - 0 Simple Staple / Suture removal (25 or less) []  - 0 Complex Staple / Suture removal (26 or more) []  - 0 Hypo / Hyperglycemic Management (close monitor of Blood Glucose) []  - 0 Ankle / Brachial Index (ABI) - do not check if billed separately X- 1 5 Vital Signs Has the patient been seen at the hospital within the last three years: Yes Total Score: 90 Level Of Care: New/Established - Level 3 Electronic Signature(s) Signed: 12/21/2019 5:33:37 PM By: Reginald Ayers Entered By: Reginald Ayers on 12/21/2019 15:30:15 -------------------------------------------------------------------------------- Encounter Discharge Information Details Patient Name: Date of Service: Reginald Ayers 12/21/2019 2:00 PM Medical Record Number: [id number] Patient Account Number: [id number] Date of Birth/Sex: Treating RN: [birth year] (69 y.o. Reginald Ayers Primary Care Reginald Ayers: Reginald Ayers Other Clinician: Referring Reginald Ayers: Treating  Reginald Ayers/Extender: Reginald Ayers in Treatment: 197 Encounter Discharge Information Items Discharge Condition: Stable Ambulatory Status: Cane Discharge Destination: Home Transportation: Private Auto Accompanied By: self Schedule Follow-up Appointment: No Clinical Summary of Care: Notes foam bordered applied for protection. Electronic Signature(s) Signed: 12/21/2019 5:33:37 PM By: Reginald Ayers Entered By: Reginald Ayers on 12/21/2019 15:31:07 -------------------------------------------------------------------------------- Lower Extremity Assessment Details Patient Name: Date of Service: Reginald Ayers, Reginald Ayers 12/21/2019 2:00 PM Medical Record Number: [id number] Patient Account Number: [id number] Date of Birth/Sex: Treating RN: 21-Jan-1951 (69 y.o. [name] Primary Care Domique Reardon: Reginald Ayers Other Clinician: Referring Contessa Preuss: Treating Keneshia Tena/Extender: Reginald Ayers in Treatment: 197 Edema Assessment Assessed: Shirlyn Goltz: No] Patrice Paradise: No] Edema: [Left: Ye] [Right: s] Calf Left: Right: Point of Measurement: 30 cm From Medial Instep cm 30 cm Ankle Left: Right: Point of Measurement: 10 cm From Medial Instep cm 19 cm Vascular Assessment Pulses: Dorsalis Pedis Palpable: [Right:Yes] Electronic Signature(s) Signed: 12/21/2019 5:31:56 PM By: Levan Hurst RN, BSN Entered By: Levan Hurst on 12/21/2019 14:35:35 -------------------------------------------------------------------------------- Multi Wound Chart Details Patient Name: Date of Service: Reginald Ayers 12/21/2019 2:00 PM Medical Record Number: [id number] Patient Account Number: [id number] Date of Birth/Sex: Treating RN: 1951-04-10 (69 y.o. Reginald Ayers Primary Care Avonte Sensabaugh: Reginald Ayers Other Clinician: Referring Aela Bohan: Treating Laniah Grimm/Extender: Reginald Ayers in Treatment: 197 Vital Signs Height(in): 66 Pulse(bpm):  32 Weight(lbs): 122 Blood Pressure(mmHg): 160/68 Body Mass Index(BMI): 23 Temperature(F): 98.3 Respiratory Rate(breaths/min): 16 Photos: [5R:No Photos Right, Anterior Lower Leg] [N/A:N/A N/A] Wound Location: [5R:Gradually Appeared] [N/A:N/A] Wounding Event: [5R:Diabetic Wound/Ulcer of the Lower] [N/A:N/A] Primary Etiology: [5R:Extremity Chronic sinus problems/congestion,] [N/A:N/A] Comorbid History: [5R:Chronic Obstructive Pulmonary Disease (COPD), Coronary Artery Disease, Hypertension, Peripheral Arterial Disease, Type II Diabetes, Received Chemotherapy 04/21/2018] [N/A:N/A] Date Acquired: [5R:85] [N/A:N/A] Weeks of  Treatment: [5R:Open] [N/A:N/A] Wound Status: [5R:Yes] [N/A:N/A] Wound Recurrence: [5R:0x0x0] [N/A:N/A] Measurements L x W x D (cm) [5R:0] [N/A:N/A] A (cm) : rea [5R:0] [N/A:N/A] Volume (cm) : [5R:100.00%] [N/A:N/A] % Reduction in A rea: [5R:100.00%] [N/A:N/A] % Reduction in Volume: [5R:Grade 2] [N/A:N/A] Classification: [5R:None Present] [N/A:N/A] Exudate A mount: [5R:Flat and Intact] [N/A:N/A] Wound Margin: [5R:None Present (0%)] [N/A:N/A] Granulation A mount: [5R:None Present (0%)] [N/A:N/A] Necrotic A mount: [5R:Fascia: No] [N/A:N/A] Exposed Structures: [5R:Fat Layer (Subcutaneous Tissue) Exposed: No Tendon: No Muscle: No Joint: No Bone: No Large (67-100%)] [N/A:N/A] Treatment Notes Electronic Signature(s) Signed: 12/21/2019 5:41:26 PM By: Baltazar Najjar MD Signed: 01/15/2020 1:30:32 PM By: Shawn Stall Entered By: Baltazar Najjar on 12/21/2019 15:41:37 -------------------------------------------------------------------------------- Multi-Disciplinary Care Plan Details Patient Name: Date of Service: Reginald Ruffing. 12/21/2019 2:00 PM Medical Record Number: [id number] Patient Account Number: [id number] Date of Birth/Sex: Treating RN: 1950/11/08 (69 y.o. Reginald Ayers Primary Care Mamie Hundertmark: Katy Apo Other Clinician: Referring Noreen Mackintosh: Treating  Aryia Delira/Extender: Demetria Pore in Treatment: 197 Active Inactive Electronic Signature(s) Signed: 12/21/2019 5:33:37 PM By: Shawn Stall Signed: 01/15/2020 1:30:32 PM By: Shawn Stall Entered By: Shawn Stall on 12/21/2019 14:59:32 -------------------------------------------------------------------------------- Pain Assessment Details Patient Name: Date of Service: Reginald Ruffing 12/21/2019 2:00 PM Medical Record Number: [id number] Patient Account Number: [id number] Date of Birth/Sex: Treating RN: [birth year] (69 y.o. Reginald Ayers Primary Care Regnia Mathwig: Katy Apo Other Clinician: Referring Shaquandra Galano: Treating Stellan Vick/Extender: Demetria Pore in Treatment: 197 Active Problems Location of Pain Severity and Description of Pain Patient Has Paino No Site Locations Pain Management and Medication Current Pain Management: Electronic Signature(s) Signed: 12/21/2019 5:31:56 PM By: Reginald Abts RN, BSN Entered By: Reginald Ayers on 12/21/2019 14:35:28 -------------------------------------------------------------------------------- Patient/Caregiver Education Details Patient Name: Date of Service: Reginald Ruffing 4/29/2021andnbsp2:00 PM Medical Record Number: [id number] Patient Account Number: [id number] Date of Birth/Gender: Treating RN: [birth year] (69 y.o. Reginald Ayers Primary Care Physician: Katy Apo Other Clinician: Referring Physician: Treating Physician/Extender: Demetria Pore in Treatment: 197 Education Assessment Education Provided To: Patient Education Topics Provided Nutrition: Handouts: Nutrition Methods: Explain/Verbal Responses: Reinforcements needed Electronic Signature(s) Signed: 12/21/2019 5:33:37 PM By: Shawn Stall Entered By: Shawn Stall on 12/21/2019 14:35:33 -------------------------------------------------------------------------------- Wound  Assessment Details Patient Name: Date of Service: Reginald Ayers, Reginald Ayers 12/21/2019 2:00 PM Medical Record Number: [id number] Patient Account Number: [id number] Date of Birth/Sex: Treating RN: [birth year] (69 y.o. Reginald Ayers Primary Care Crystle Carelli: Katy Apo Other Clinician: Referring Jumar Greenstreet: Treating Najiyah Paris/Extender: Demetria Pore in Treatment: 197 Wound Status Wound Number: 5R Primary Diabetic Wound/Ulcer of the Lower Extremity Etiology: Wound Location: Right, Anterior Lower Leg Wound Open Wounding Event: Gradually Appeared Status: Date Acquired: 04/21/2018 Comorbid Chronic sinus problems/congestion, Chronic Obstructive Pulmonary Weeks Of Treatment: 85 History: Disease (COPD), Coronary Artery Disease, Hypertension, Clustered Wound: No Peripheral Arterial Disease, Type II Diabetes, Received Chemotherapy Photos Photo Uploaded By: Benjaman Kindler on 12/22/2019 14:48:19 Wound Measurements Length: (cm) Width: (cm) Depth: (cm) Area: (cm) Volume: (cm) 0 % Reduction in Area: 100% 0 % Reduction in Volume: 100% 0 Epithelialization: Large (67-100%) 0 Tunneling: No 0 Undermining: No Wound Description Classification: Grade 2 Wound Margin: Flat and Intact Exudate Amount: None Present Foul Odor After Cleansing: No Slough/Fibrino No Wound Bed Granulation Amount: None Present (0%) Exposed Structure Necrotic Amount: None Present (0%) Fascia Exposed: No Fat Layer (Subcutaneous Tissue) Exposed: No Tendon Exposed: No Muscle Exposed: No Joint Exposed: No Bone  Exposed: No Electronic Signature(s) Signed: 12/21/2019 5:31:56 PM By: Reginald Abts RN, BSN Entered By: Reginald Ayers on 12/21/2019 14:39:18 -------------------------------------------------------------------------------- Vitals Details Patient Name: Date of Service: Reginald Ruffing. 12/21/2019 2:00 PM Medical Record Number: [id number] Patient Account Number: [id number] Date of  Birth/Sex: Treating RN: October 17, 1950 (69 y.o. Reginald Ayers Primary Care Jesalyn Finazzo: Katy Apo Other Clinician: Referring Jaimon Bugaj: Treating Providencia Hottenstein/Extender: Demetria Pore in Treatment: 197 Vital Signs Time Taken: 14:35 Temperature (F): 98.3 Height (in): 61 Pulse (bpm): 78 Weight (lbs): 122 Respiratory Rate (breaths/min): 16 Body Mass Index (BMI): 23 Blood Pressure (mmHg): 160/68 Reference Range: 80 - 120 mg / dl Electronic Signature(s) Signed: 12/21/2019 5:31:56 PM By: Reginald Abts RN, BSN Entered By: Reginald Ayers on 12/21/2019 14:35:22

## 2020-01-19 DIAGNOSIS — J449 Chronic obstructive pulmonary disease, unspecified: Secondary | ICD-10-CM | POA: Diagnosis not present

## 2020-02-12 ENCOUNTER — Ambulatory Visit: Payer: Medicare Other | Admitting: Podiatry

## 2020-03-04 DIAGNOSIS — I2510 Atherosclerotic heart disease of native coronary artery without angina pectoris: Secondary | ICD-10-CM | POA: Diagnosis not present

## 2020-03-04 DIAGNOSIS — F17210 Nicotine dependence, cigarettes, uncomplicated: Secondary | ICD-10-CM | POA: Diagnosis not present

## 2020-03-04 DIAGNOSIS — I10 Essential (primary) hypertension: Secondary | ICD-10-CM | POA: Diagnosis not present

## 2020-03-04 DIAGNOSIS — Z7984 Long term (current) use of oral hypoglycemic drugs: Secondary | ICD-10-CM | POA: Diagnosis not present

## 2020-03-04 DIAGNOSIS — Z1211 Encounter for screening for malignant neoplasm of colon: Secondary | ICD-10-CM | POA: Diagnosis not present

## 2020-03-04 DIAGNOSIS — E11319 Type 2 diabetes mellitus with unspecified diabetic retinopathy without macular edema: Secondary | ICD-10-CM | POA: Diagnosis not present

## 2020-03-04 DIAGNOSIS — E7800 Pure hypercholesterolemia, unspecified: Secondary | ICD-10-CM | POA: Diagnosis not present

## 2020-03-04 DIAGNOSIS — I739 Peripheral vascular disease, unspecified: Secondary | ICD-10-CM | POA: Diagnosis not present

## 2020-03-04 DIAGNOSIS — E1151 Type 2 diabetes mellitus with diabetic peripheral angiopathy without gangrene: Secondary | ICD-10-CM | POA: Diagnosis not present

## 2020-03-04 DIAGNOSIS — J441 Chronic obstructive pulmonary disease with (acute) exacerbation: Secondary | ICD-10-CM | POA: Diagnosis not present

## 2020-03-04 DIAGNOSIS — N1831 Chronic kidney disease, stage 3a: Secondary | ICD-10-CM | POA: Diagnosis not present

## 2020-03-11 ENCOUNTER — Ambulatory Visit: Payer: Medicare Other | Admitting: Podiatry

## 2020-06-05 ENCOUNTER — Other Ambulatory Visit: Payer: Self-pay

## 2020-06-05 ENCOUNTER — Encounter: Payer: Self-pay | Admitting: Podiatry

## 2020-06-05 ENCOUNTER — Ambulatory Visit (INDEPENDENT_AMBULATORY_CARE_PROVIDER_SITE_OTHER): Payer: Medicare Other | Admitting: Podiatry

## 2020-06-05 DIAGNOSIS — M79674 Pain in right toe(s): Secondary | ICD-10-CM

## 2020-06-05 DIAGNOSIS — B351 Tinea unguium: Secondary | ICD-10-CM | POA: Diagnosis not present

## 2020-06-05 DIAGNOSIS — M79675 Pain in left toe(s): Secondary | ICD-10-CM | POA: Diagnosis not present

## 2020-06-05 DIAGNOSIS — L600 Ingrowing nail: Secondary | ICD-10-CM

## 2020-06-05 DIAGNOSIS — E1151 Type 2 diabetes mellitus with diabetic peripheral angiopathy without gangrene: Secondary | ICD-10-CM

## 2020-06-09 NOTE — Progress Notes (Signed)
Subjective: Reginald Ayers presents today for follow up of for at risk foot care. Patient has h/o PAD and painful mycotic nails b/l that are difficult to trim. Pain interferes with ambulation. Aggravating factors include wearing enclosed shoe gear. Pain is relieved with periodic professional debridement.   Reginald Ayers states his blood glucose was taken a few weeks ago and was 118 mg/dl.  His PCP is Dr. Renford Dills and last visit was March, 2021 per patient recall.  Allergies  Allergen Reactions  . Actos [Pioglitazone] Shortness Of Breath    Leg swelling   . Lisinopril Cough    Objective: There were no vitals filed for this visit.  Pt 69 y.o. year old Caucasian male WD, WN in NAD. AAO x 3.   Vascular Examination:  Capillary refill time to digits <4 seconds b/l. Faintly palpable DP pulses b/l. Faintly palpable PT pulses b/l. Pedal hair present b/l. Skin temperature gradient warm to cool b/l. Evidence of chronic venous insufficiency b/l LE.  Dermatological Examination: Pedal skin is thin shiny, atrophic bilaterally. No interdigital macerations bilaterally. Toenails 1-5 b/l elongated, dystrophic, thickened, crumbly with subungual debris and tenderness to dorsal palpation. Incurvated nailplate b/l great toes with tenderness to palpation. No erythema, no edema, no drainage noted.  Musculoskeletal: Normal muscle strength 5/5 to all lower extremity muscle groups bilaterally, no gross bony deformities bilaterally and no pain crepitus or joint limitation noted with ROM b/l  Neurological: Protective sensation intact 5/5 intact bilaterally with 10g monofilament b/l Vibratory sensation intact b/l  Assessment: 1. Pain due to onychomycosis of toenails of both feet   2. Ingrown toenail without infection   3. Type II diabetes mellitus with peripheral circulatory disorder (HCC)    Plan: -Continue diabetic foot care principles. -Toenails 1-5 b/l were debrided in length and girth with sterile nail  nippers and dremel without iatrogenic bleeding.  -Offending nail border debrided and curretaged L hallux and R hallux utilizing sterile nail nipper and currette. Border(s) cleansed with alcohol and triple antibiotic ointment applied. Patient instructed to apply triple antibiotic ointment to L hallux and R hallux once daily for 7 days. -Patient to report any pedal injuries to medical professional immediately. -Patient to continue soft, supportive shoe gear daily. -Patient/POA to call should there be question/concern in the interim.  Return in about 3 months (around 09/05/2020).

## 2020-07-29 ENCOUNTER — Other Ambulatory Visit: Payer: Self-pay | Admitting: Cardiovascular Disease

## 2020-09-11 ENCOUNTER — Ambulatory Visit: Payer: Medicare Other | Admitting: Podiatry

## 2021-07-03 DIAGNOSIS — Z20822 Contact with and (suspected) exposure to covid-19: Secondary | ICD-10-CM | POA: Diagnosis not present

## 2021-07-10 ENCOUNTER — Other Ambulatory Visit: Payer: Self-pay | Admitting: Internal Medicine

## 2021-07-10 ENCOUNTER — Ambulatory Visit
Admission: RE | Admit: 2021-07-10 | Discharge: 2021-07-10 | Disposition: A | Discharge status: Home / Self Care | Payer: Medicare Other | Source: Ambulatory Visit | Attending: Internal Medicine | Admitting: Internal Medicine

## 2021-07-10 DIAGNOSIS — J439 Emphysema, unspecified: Secondary | ICD-10-CM

## 2021-07-10 DIAGNOSIS — I739 Peripheral vascular disease, unspecified: Secondary | ICD-10-CM | POA: Diagnosis not present

## 2021-07-10 DIAGNOSIS — E11319 Type 2 diabetes mellitus with unspecified diabetic retinopathy without macular edema: Secondary | ICD-10-CM | POA: Diagnosis not present

## 2021-07-10 DIAGNOSIS — Z23 Encounter for immunization: Secondary | ICD-10-CM | POA: Diagnosis not present

## 2021-07-10 DIAGNOSIS — Z951 Presence of aortocoronary bypass graft: Secondary | ICD-10-CM | POA: Diagnosis not present

## 2021-07-10 DIAGNOSIS — I2510 Atherosclerotic heart disease of native coronary artery without angina pectoris: Secondary | ICD-10-CM | POA: Diagnosis not present

## 2021-07-10 DIAGNOSIS — N1831 Chronic kidney disease, stage 3a: Secondary | ICD-10-CM | POA: Diagnosis not present

## 2021-07-10 DIAGNOSIS — I10 Essential (primary) hypertension: Secondary | ICD-10-CM | POA: Diagnosis not present

## 2021-07-10 DIAGNOSIS — F17210 Nicotine dependence, cigarettes, uncomplicated: Secondary | ICD-10-CM | POA: Diagnosis not present

## 2021-07-10 DIAGNOSIS — E1151 Type 2 diabetes mellitus with diabetic peripheral angiopathy without gangrene: Secondary | ICD-10-CM | POA: Diagnosis not present

## 2021-07-10 DIAGNOSIS — E7800 Pure hypercholesterolemia, unspecified: Secondary | ICD-10-CM | POA: Diagnosis not present

## 2021-07-10 DIAGNOSIS — E1059 Type 1 diabetes mellitus with other circulatory complications: Secondary | ICD-10-CM | POA: Diagnosis not present

## 2021-07-10 IMAGING — DX DG CHEST 2V
2 series · 2 of 2 positions shown · non-contrast
Comparison: [DATE]

CLINICAL DATA: 70-year-old male with a history of pulmonary
emphysema

EXAM:
CHEST - 2 VIEW

[dg chest 2 view (1 of 2)]
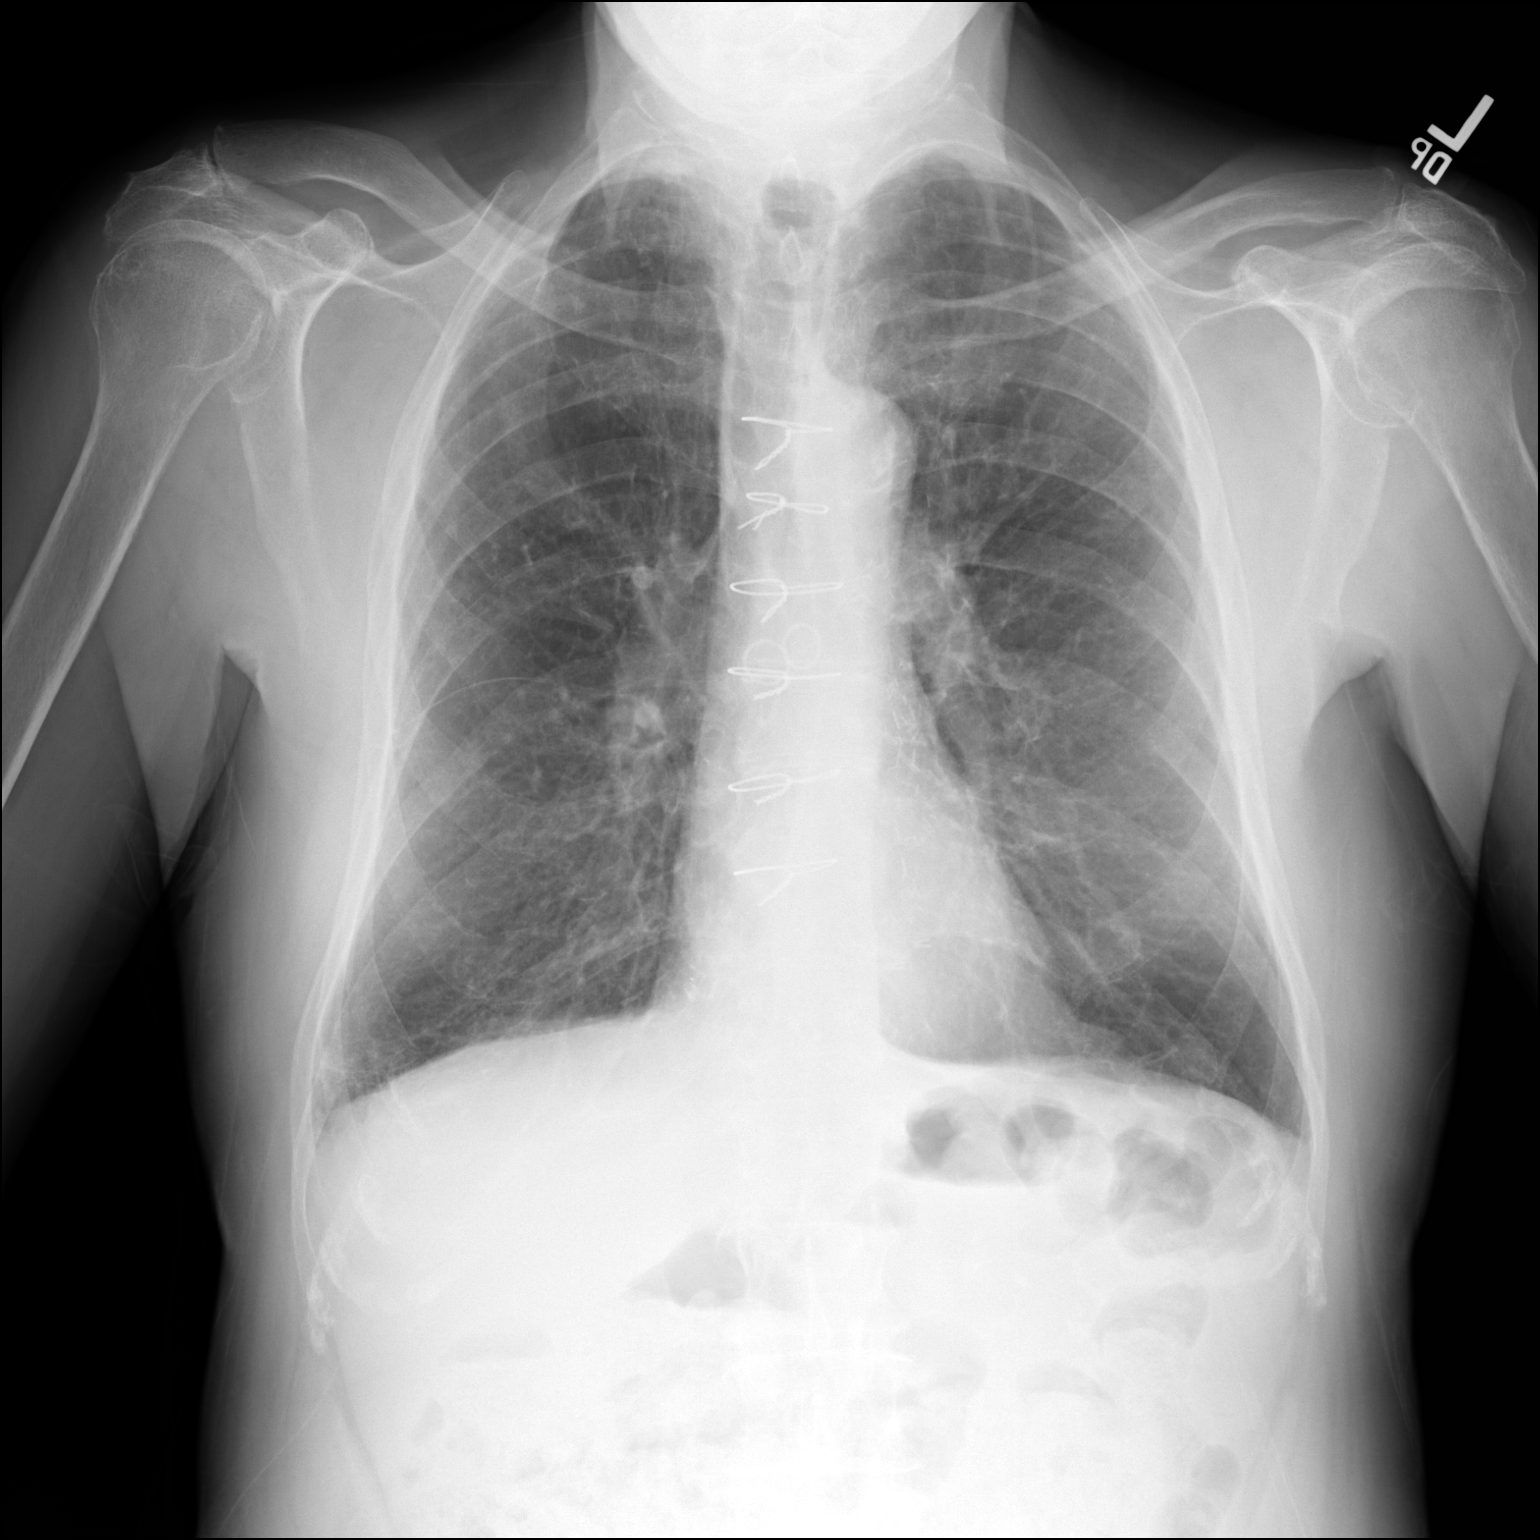

[dg chest 2 view (2 of 2)]
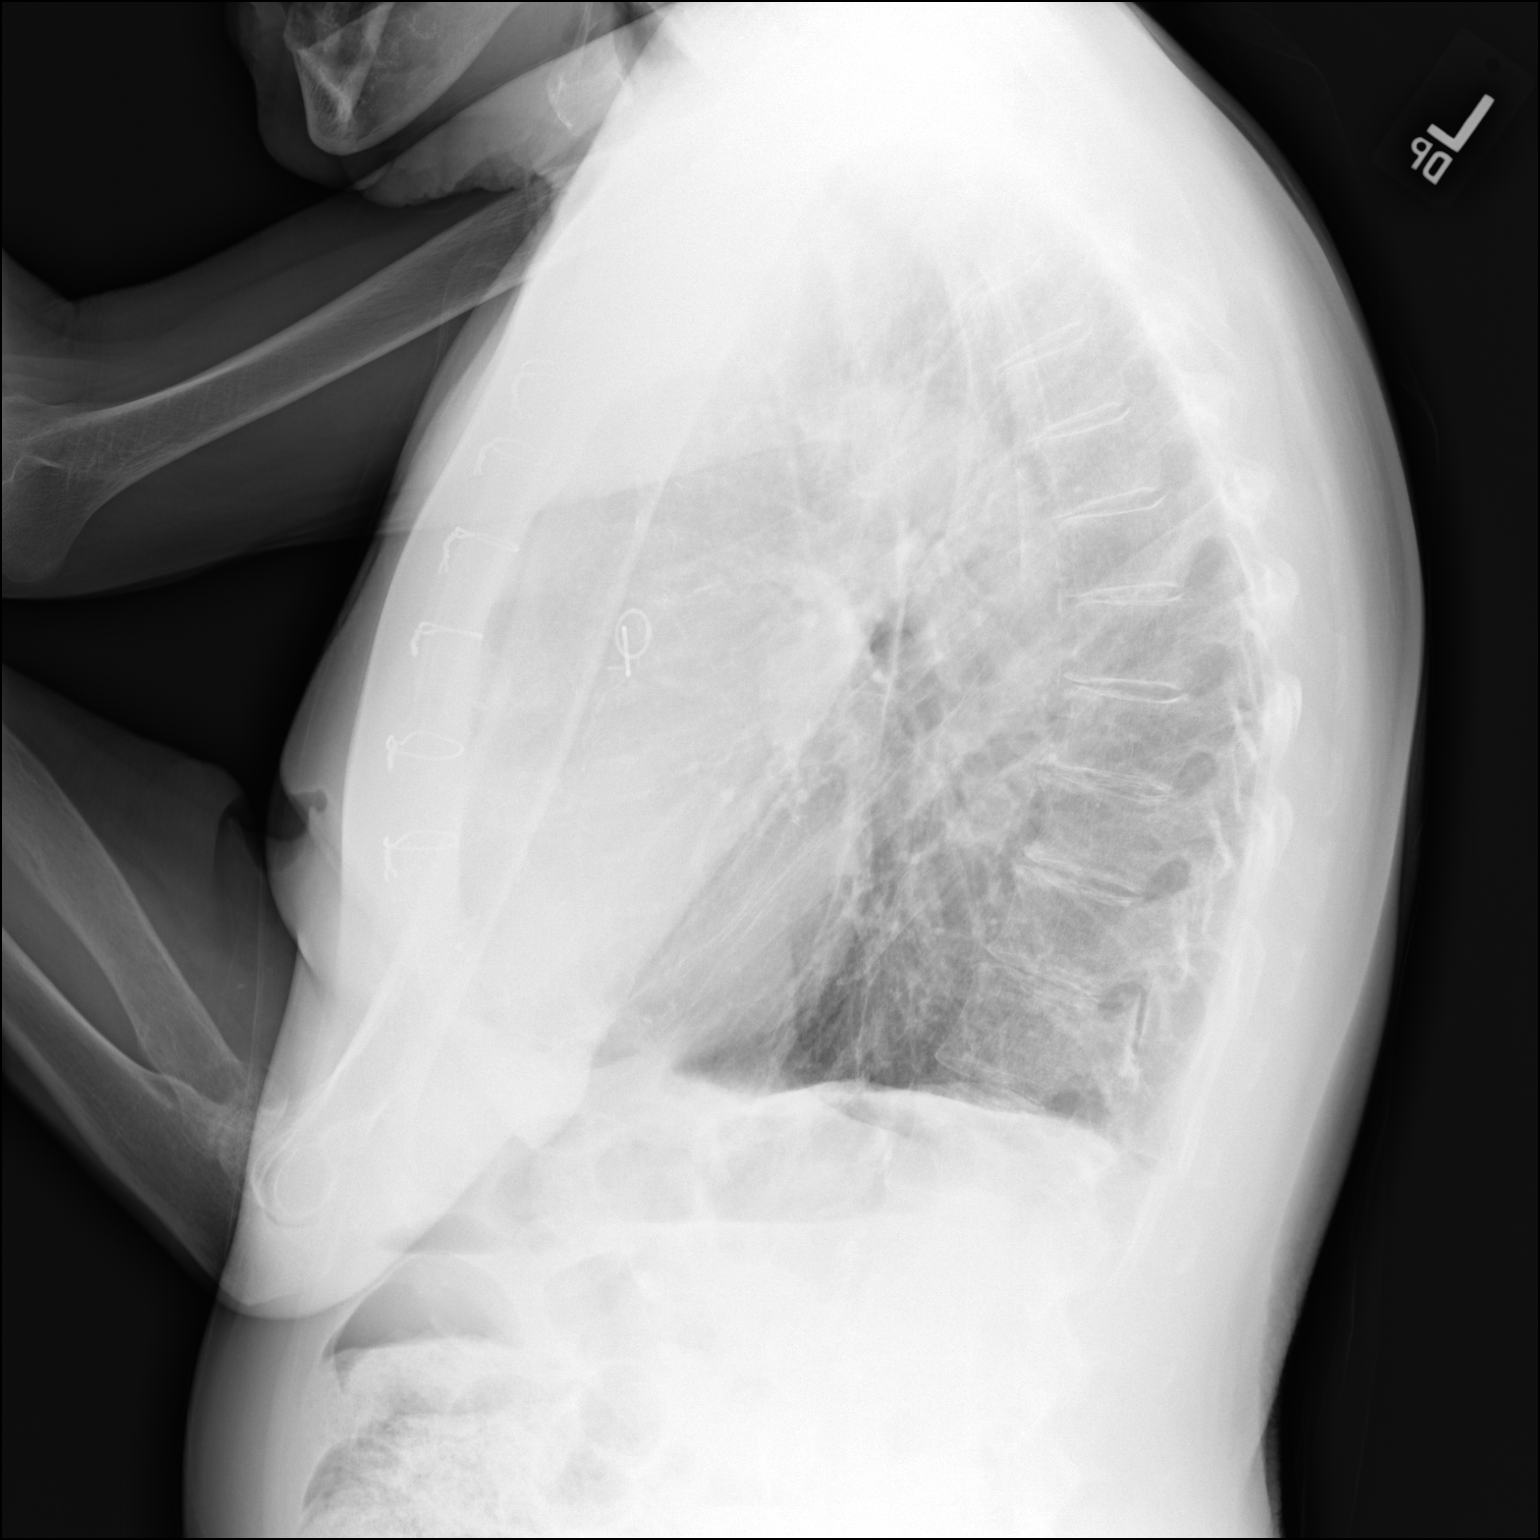

[2 of 2 positions shown; findings below may reference images not displayed]

FINDINGS: Cardiomediastinal silhouette unchanged in size and contour. No
evidence of central vascular congestion. No interlobular septal
thickening.

Surgical changes of median sternotomy and CABG.

Coarsened interstitial markings throughout with no new confluent
airspace disease.

Stigmata of emphysema, with increased retrosternal airspace,
flattened hemidiaphragms, increased AP diameter, and hyperinflation
on the AP view.

Linear opacity at the right costophrenic angle, unchanged.

Degenerative changes of the spine.  No acute displaced fracture.
IMPRESSION: Emphysema and chronic changes, without definite evidence of acute
cardiopulmonary disease.

Surgical changes of median sternotomy and CABG

## 2021-07-24 DIAGNOSIS — E1151 Type 2 diabetes mellitus with diabetic peripheral angiopathy without gangrene: Secondary | ICD-10-CM | POA: Diagnosis not present

## 2021-07-24 DIAGNOSIS — J449 Chronic obstructive pulmonary disease, unspecified: Secondary | ICD-10-CM | POA: Diagnosis not present

## 2021-07-24 DIAGNOSIS — F17210 Nicotine dependence, cigarettes, uncomplicated: Secondary | ICD-10-CM | POA: Diagnosis not present

## 2021-07-24 DIAGNOSIS — I739 Peripheral vascular disease, unspecified: Secondary | ICD-10-CM | POA: Diagnosis not present

## 2021-07-24 DIAGNOSIS — I10 Essential (primary) hypertension: Secondary | ICD-10-CM | POA: Diagnosis not present

## 2021-07-24 DIAGNOSIS — Z7984 Long term (current) use of oral hypoglycemic drugs: Secondary | ICD-10-CM | POA: Diagnosis not present

## 2021-07-24 DIAGNOSIS — N1831 Chronic kidney disease, stage 3a: Secondary | ICD-10-CM | POA: Diagnosis not present

## 2021-07-24 DIAGNOSIS — E11319 Type 2 diabetes mellitus with unspecified diabetic retinopathy without macular edema: Secondary | ICD-10-CM | POA: Diagnosis not present

## 2021-07-24 DIAGNOSIS — E7800 Pure hypercholesterolemia, unspecified: Secondary | ICD-10-CM | POA: Diagnosis not present

## 2021-07-30 ENCOUNTER — Ambulatory Visit (INDEPENDENT_AMBULATORY_CARE_PROVIDER_SITE_OTHER): Payer: Medicare Other | Admitting: Cardiovascular Disease

## 2021-07-30 ENCOUNTER — Other Ambulatory Visit: Payer: Self-pay

## 2021-07-30 ENCOUNTER — Encounter: Payer: Self-pay | Admitting: Cardiovascular Disease

## 2021-07-30 VITALS — BP 148/74 | HR 78 | Ht 61.0 in | Wt 120.0 lb

## 2021-07-30 DIAGNOSIS — Z951 Presence of aortocoronary bypass graft: Secondary | ICD-10-CM

## 2021-07-30 DIAGNOSIS — Z720 Tobacco use: Secondary | ICD-10-CM | POA: Diagnosis not present

## 2021-07-30 DIAGNOSIS — E782 Mixed hyperlipidemia: Secondary | ICD-10-CM | POA: Diagnosis not present

## 2021-07-30 DIAGNOSIS — I6523 Occlusion and stenosis of bilateral carotid arteries: Secondary | ICD-10-CM | POA: Diagnosis not present

## 2021-07-30 MED ORDER — ATORVASTATIN CALCIUM 40 MG PO TABS: 40.0000 mg | ORAL_TABLET | Freq: Every day | ORAL | 3 refills | Status: DC

## 2021-07-30 NOTE — Assessment & Plan Note (Signed)
History of essential hypertension blood pressure measured today 160/70.  He is on losartan and metoprolol.

## 2021-07-30 NOTE — Assessment & Plan Note (Signed)
History of discontinue tobacco abuse. 

## 2021-07-30 NOTE — Progress Notes (Signed)
07/30/2021 TALUS SCARFO   1950-10-22  [id number]  Primary Physician Seward Carol, MD Primary Cardiologist: Lorretta Harp MD FACP, [place], [place], [place]  HPI:  ADIEN BACHO is a 70 y.o.   thin-appearing, single Caucasian male with no children whom I last saw 11 months ago. He has a history of CAD and PAD.I  last saw him in the office 07/19/2019. I stented his proximal left common iliac artery in December 2003 and right SFA in April of 2008. He does have significant infrapopliteal disease bilaterally and known occluded left SFA. He also has an occluded right SFA by Dopplers in our office last September was earlier this month.. His ABIs were 0.48 on the right and 0.52 on the left, but he really denies claudication. He has known carotid disease with occluded left internal carotid artery and moderate right ICA stenosis, which has remained ultrasonographically stable. He is neurologically asymptomatic. His other problems include diabetes, hypertension, hyperlipidemia, and long-term tobacco abuse, smoking one-half pack per day. He had coronary artery bypass grafting by Dr. Merilynn Finland in February of 2010 with LIMA to his LAD, left radial to the first obtuse marginal branch, vein to ramus intermedius, and a sequential vein to an acute marginal and distal right coronary artery. Myoview performed in May of 2010 was nonischemic and showed apical scar consistent with his wall motion abnormality and his known occluded LAD.Dr. Maxwell Caul follows his lipid profile.   He had recurrent critical limb ischemia and had intervention by Dr. Trula Slade in December of last year opening up an occluded right SFA stent that I have placed 11 years ago with a Viabahn. covered stent.  He has recently complained of recurrent nitro responsive chest pain.  Did have a Myoview stress test performed in 2015 which was low risk.     He did have a Myoview performed 01/05/2018 which was low risk.  He apparently does have wounds on his  right leg followed by Dr. Dellia Nims and he has an upcoming appointment with Dr. Trula Slade.   Since I saw him a year ago he is remained stable.  He does get occasional chest burning which she says resolves with antacids.  The vascular surgeons follow his Doppler studies of his carotid and lower extremities.  Current Meds  Medication Sig   albuterol (VENTOLIN HFA) 108 (90 Base) MCG/ACT inhaler SMARTSIG:2 Puff(s) By Mouth Every 4 Hours PRN   atorvastatin (LIPITOR) 40 MG tablet Take 1 tablet (40 mg total) by mouth daily.   B Complex Vitamins (B COMPLEX PO) Take 1 tablet by mouth 2 (two) times a week.    clopidogrel (PLAVIX) 75 MG tablet Take 75 mg by mouth daily.    Cyanocobalamin (VITAMIN B 12 PO) Take by mouth.   doxycycline (ADOXA) 100 MG tablet Take 100 mg by mouth 2 (two) times daily.   empagliflozin (JARDIANCE) 25 MG TABS tablet Take 25 mg by mouth daily.   glipiZIDE (GLUCOTROL XL) 10 MG 24 hr tablet Take 10 mg by mouth 2 (two) times daily.    Homeopathic Products (LEG CRAMP RELIEF SL) Place 2 tablets under the tongue at bedtime as needed (leg cramps).    ibuprofen (ADVIL,MOTRIN) 200 MG tablet Take 800 mg by mouth every 8 (eight) hours as needed for moderate pain.   IRON PO Take 1 tablet by mouth 4 (four) times a week.    losartan (COZAAR) 50 MG tablet 1 tablet   metFORMIN (GLUCOPHAGE) 500 MG tablet Take 1,000 mg by  mouth 2 (two) times daily with a meal.    metoprolol succinate (TOPROL-XL) 25 MG 24 hr tablet TAKE 1 TABLET(25 MG) BY MOUTH DAILY   NITROSTAT 0.4 MG SL tablet Take 0.4 mg by mouth every 5 (five) minutes as needed for chest pain.    Polyvinyl Alcohol-Povidone (MURINE TEARS FOR DRY EYES OP) Apply 1 drop to eye 3 (three) times daily as needed (dry eyes).   potassium chloride (K-DUR) 10 MEQ tablet Take 10 mEq by mouth daily.   Pseudoeph-Doxylamine-DM-APAP (NYQUIL PO) Take 1 Dose by mouth at bedtime.    SPIRIVA HANDIHALER 18 MCG inhalation capsule Place 18 mcg into inhaler and inhale  daily at 8 pm.    traMADol (ULTRAM) 50 MG tablet Take 50 mg by mouth 3 (three) times daily as needed for severe pain.    [DISCONTINUED] Benzocaine (BOIL-EASE EX) Apply 1 application topically daily as needed (BOIL).   [DISCONTINUED] benzonatate (TESSALON) 100 MG capsule Take 100 mg by mouth 3 (three) times daily as needed.   [DISCONTINUED] Cyanocobalamin (VITAMIN B-12 IJ) Inject 1 Dose as directed every 30 (thirty) days.    [DISCONTINUED] empagliflozin (JARDIANCE) 10 MG TABS tablet Take 10 mg by mouth daily.   [DISCONTINUED] predniSONE (STERAPRED UNI-PAK 21 TAB) 10 MG (21) TBPK tablet Take by mouth.   [DISCONTINUED] simvastatin (ZOCOR) 40 MG tablet Take 40 mg by mouth every evening.     Allergies  Allergen Reactions   Actos [Pioglitazone] Shortness Of Breath    Leg swelling    Lisinopril Cough    Social History   Socioeconomic History   Marital status: Single    Spouse name: Not on file   Number of children: Not on file   Years of education: Not on file   Highest education level: Not on file  Occupational History   Occupation: Retired Radiographer, therapeutic  Tobacco Use   Smoking status: Light Smoker    Packs/day: 0.00    Types: Cigarettes    Last attempt to quit: 08/07/2016    Years since quitting: 4.9   Smokeless tobacco: Current    Types: Chew  Vaping Use   Vaping Use: Never used  Substance and Sexual Activity   Alcohol use: No    Alcohol/week: 0.0 standard drinks    Comment: Weekend drinker, quit 2000. NIGHTLY [place]ill   Drug use: No   Sexual activity: Not on file  Other Topics Concern   Not on file  Social History Narrative   Lives in [place]. Has 2 roommates, they both smoke. Has 9 dogs.   Social Determinants of Health   Financial Resource Strain: Not on file  Food Insecurity: Not on file  Transportation Needs: Not on file  Physical Activity: Not on file  Stress: Not on file  Social Connections: Not on file  Intimate Partner Violence: Not on file     Review of  Systems: General: negative for chills, fever, night sweats or weight changes.  Cardiovascular: negative for chest pain, dyspnea on exertion, edema, orthopnea, palpitations, paroxysmal nocturnal dyspnea or shortness of breath Dermatological: negative for rash Respiratory: negative for cough or wheezing Urologic: negative for hematuria Abdominal: negative for nausea, vomiting, diarrhea, bright red blood per rectum, melena, or hematemesis Neurologic: negative for visual changes, syncope, or dizziness All other systems reviewed and are otherwise negative except as noted above.    Blood pressure (!) 160/70, pulse 78, height 5\' 1"  (1.549 m), weight 120 lb (54.4 kg), SpO2 99 %.  General appearance: alert and no distress Neck:  no adenopathy, no JVD, supple, symmetrical, trachea midline, thyroid not enlarged, symmetric, no tenderness/mass/nodules, and soft right carotid Deaisa Merida Lungs: clear to auscultation bilaterally Heart: regular rate and rhythm, S1, S2 normal, no murmur, click, rub or gallop Extremities: extremities normal, atraumatic, no cyanosis or edema Pulses: Diminished pedal pulses Skin: Skin color, texture, turgor normal. No rashes or lesions Neurologic: Grossly normal  EKG sinus rhythm at 78 with PACs and left axis deviation septal Q waves.  I personally reviewed this EKG.  ASSESSMENT AND PLAN:   Hx of CABG Feb 2010 History of CAD status post coronary bypass grafting by Dr. Merilynn Finland February 2010 with a LIMA to his LAD, left radial to the first obtuse marginal branch, vein to the ramus intermedius and sequential vein to acute marginal and distal RCA.  His last Myoview performed 2015 was nonischemic.  Does get some burning chest pain which resolves with antacids.  Peripheral arterial disease (HCC) History of PAD status post left common iliac artery stenting by myself in December 2003 right SFA intervention in April 2008.  He has had several peripheral vascular interventions by  Dr. Trula Slade more recently who follows his Doppler studies.  Carotid artery disease (HCC) History of carotid artery disease with a known occluded left internal carotid artery moderate right ICA stenosis followed by vascular surgery.  Essential hypertension History of essential hypertension blood pressure measured today 160/70.  He is on losartan and metoprolol.  Hyperlipidemia History of hyperlipidemia on simvastatin 40 mg a day with lipid profile performed 07/10/2021 revealing total cholesterol of 208, LDL of 139 HDL 47.  I am going to change him to atorvastatin 40 mg a day and we will recheck a lipid liver profile in 3 months.  Tobacco abuse History of discontinue tobacco abuse.     Lorretta Harp MD FACP,FACC,FAHA, [facility] 07/30/2021 2:37 PM

## 2021-07-30 NOTE — Assessment & Plan Note (Signed)
History of hyperlipidemia on simvastatin 40 mg a day with lipid profile performed 07/10/2021 revealing total cholesterol of 208, LDL of 139 HDL 47.  I am going to change him to atorvastatin 40 mg a day and we will recheck a lipid liver profile in 3 months.

## 2021-07-30 NOTE — Assessment & Plan Note (Signed)
History of carotid artery disease with a known occluded left internal carotid artery moderate right ICA stenosis followed by vascular surgery.

## 2021-07-30 NOTE — Patient Instructions (Signed)
Medication Instructions:   -Stop taking simvastatin (zocor).  -Start taking atorvastatin (lipitor) 40mg  once daily.  *If you need a refill on your cardiac medications before your next appointment, please call your pharmacy*   Lab Work: Your physician recommends that you return for lab work in: 3 months for FASTING lipid/liver profile.  If you have labs (blood work) drawn today and your tests are completely normal, you will receive your results only by: MyChart Message (if you have MyChart) OR A paper copy in the mail If you have any lab test that is abnormal or we need to change your treatment, we will call you to review the results.   Follow-Up: At [facility], you and your health needs are our priority.  As part of our continuing mission to provide you with exceptional heart care, we have created designated Provider Care Teams.  These Care Teams include your primary Cardiologist (physician) and Advanced Practice Providers (APPs -  Physician Assistants and Nurse Practitioners) who all work together to provide you with the care you need, when you need it.  We recommend signing up for the patient portal called "MyChart".  Sign up information is provided on this After Visit Summary.  MyChart is used to connect with patients for Virtual Visits (Telemedicine).  Patients are able to view lab/test results, encounter notes, upcoming appointments, etc.  Non-urgent messages can be sent to your provider as well.   To learn more about what you can do with MyChart, go to [facility].    Your next appointment:   12 month(s)  The format for your next appointment:   In Person  Provider:   [email or web address], MD

## 2021-07-30 NOTE — Assessment & Plan Note (Signed)
History of PAD status post left common iliac artery stenting by myself in December 2003 right SFA intervention in April 2008.  He has had several peripheral vascular interventions by Dr. Myra Gianotti more recently who follows his Doppler studies.

## 2021-07-30 NOTE — Assessment & Plan Note (Signed)
History of CAD status post coronary bypass grafting by Dr. Andrey Spearman February 2010 with a LIMA to his LAD, left radial to the first obtuse marginal branch, vein to the ramus intermedius and sequential vein to acute marginal and distal RCA.  His last Myoview performed 2015 was nonischemic.  Does get some burning chest pain which resolves with antacids.

## 2021-08-05 NOTE — Addendum Note (Signed)
Addended by: Brunetta Genera on: 08/05/2021 04:02 PM   Modules accepted: Orders

## 2021-08-11 ENCOUNTER — Other Ambulatory Visit: Payer: Self-pay

## 2021-08-11 DIAGNOSIS — I872 Venous insufficiency (chronic) (peripheral): Secondary | ICD-10-CM

## 2021-08-11 DIAGNOSIS — I779 Disorder of arteries and arterioles, unspecified: Secondary | ICD-10-CM

## 2021-08-11 DIAGNOSIS — I6523 Occlusion and stenosis of bilateral carotid arteries: Secondary | ICD-10-CM

## 2021-08-26 ENCOUNTER — Encounter (HOSPITAL_COMMUNITY): Payer: Medicare Other

## 2021-09-22 ENCOUNTER — Encounter (HOSPITAL_COMMUNITY): Payer: Medicare Other

## 2021-09-22 ENCOUNTER — Ambulatory Visit: Payer: Medicare Other | Admitting: Surgery

## 2021-09-27 ENCOUNTER — Inpatient Hospital Stay (HOSPITAL_COMMUNITY)
Admission: EM | Admit: 2021-09-27 | Discharge: 2021-09-29 | DRG: 639 | Disposition: A | Discharge status: Home Health | Payer: Medicare Other | Attending: Family Medicine | Admitting: Family Medicine

## 2021-09-27 ENCOUNTER — Other Ambulatory Visit: Payer: Self-pay

## 2021-09-27 ENCOUNTER — Emergency Department (HOSPITAL_COMMUNITY): Payer: Medicare Other

## 2021-09-27 ENCOUNTER — Encounter (HOSPITAL_COMMUNITY): Payer: Self-pay

## 2021-09-27 DIAGNOSIS — F17210 Nicotine dependence, cigarettes, uncomplicated: Secondary | ICD-10-CM | POA: Diagnosis present

## 2021-09-27 DIAGNOSIS — R42 Dizziness and giddiness: Secondary | ICD-10-CM | POA: Diagnosis not present

## 2021-09-27 DIAGNOSIS — S40861A Insect bite (nonvenomous) of right upper arm, initial encounter: Secondary | ICD-10-CM | POA: Diagnosis present

## 2021-09-27 DIAGNOSIS — J449 Chronic obstructive pulmonary disease, unspecified: Secondary | ICD-10-CM

## 2021-09-27 DIAGNOSIS — Z823 Family history of stroke: Secondary | ICD-10-CM | POA: Diagnosis not present

## 2021-09-27 DIAGNOSIS — E11649 Type 2 diabetes mellitus with hypoglycemia without coma: Principal | ICD-10-CM | POA: Diagnosis present

## 2021-09-27 DIAGNOSIS — W57XXXA Bitten or stung by nonvenomous insect and other nonvenomous arthropods, initial encounter: Secondary | ICD-10-CM | POA: Diagnosis present

## 2021-09-27 DIAGNOSIS — Z8249 Family history of ischemic heart disease and other diseases of the circulatory system: Secondary | ICD-10-CM

## 2021-09-27 DIAGNOSIS — R5383 Other fatigue: Secondary | ICD-10-CM

## 2021-09-27 DIAGNOSIS — E785 Hyperlipidemia, unspecified: Secondary | ICD-10-CM | POA: Diagnosis present

## 2021-09-27 DIAGNOSIS — Z7902 Long term (current) use of antithrombotics/antiplatelets: Secondary | ICD-10-CM | POA: Diagnosis not present

## 2021-09-27 DIAGNOSIS — N179 Acute kidney failure, unspecified: Secondary | ICD-10-CM

## 2021-09-27 DIAGNOSIS — I2510 Atherosclerotic heart disease of native coronary artery without angina pectoris: Secondary | ICD-10-CM | POA: Diagnosis present

## 2021-09-27 DIAGNOSIS — E162 Hypoglycemia, unspecified: Secondary | ICD-10-CM | POA: Diagnosis not present

## 2021-09-27 DIAGNOSIS — Z7984 Long term (current) use of oral hypoglycemic drugs: Secondary | ICD-10-CM | POA: Diagnosis not present

## 2021-09-27 DIAGNOSIS — Z951 Presence of aortocoronary bypass graft: Secondary | ICD-10-CM | POA: Diagnosis not present

## 2021-09-27 DIAGNOSIS — Z833 Family history of diabetes mellitus: Secondary | ICD-10-CM

## 2021-09-27 DIAGNOSIS — E1151 Type 2 diabetes mellitus with diabetic peripheral angiopathy without gangrene: Secondary | ICD-10-CM | POA: Diagnosis present

## 2021-09-27 DIAGNOSIS — Z79899 Other long term (current) drug therapy: Secondary | ICD-10-CM

## 2021-09-27 DIAGNOSIS — I959 Hypotension, unspecified: Secondary | ICD-10-CM | POA: Diagnosis not present

## 2021-09-27 DIAGNOSIS — D649 Anemia, unspecified: Secondary | ICD-10-CM | POA: Diagnosis present

## 2021-09-27 DIAGNOSIS — I10 Essential (primary) hypertension: Secondary | ICD-10-CM | POA: Diagnosis present

## 2021-09-27 DIAGNOSIS — J439 Emphysema, unspecified: Secondary | ICD-10-CM | POA: Diagnosis present

## 2021-09-27 DIAGNOSIS — S40862A Insect bite (nonvenomous) of left upper arm, initial encounter: Secondary | ICD-10-CM | POA: Diagnosis present

## 2021-09-27 DIAGNOSIS — Z888 Allergy status to other drugs, medicaments and biological substances status: Secondary | ICD-10-CM

## 2021-09-27 DIAGNOSIS — Z720 Tobacco use: Secondary | ICD-10-CM | POA: Diagnosis not present

## 2021-09-27 DIAGNOSIS — R531 Weakness: Secondary | ICD-10-CM | POA: Diagnosis present

## 2021-09-27 DIAGNOSIS — I739 Peripheral vascular disease, unspecified: Secondary | ICD-10-CM | POA: Diagnosis present

## 2021-09-27 LAB — HEMOGLOBIN A1C
Hgb A1c MFr Bld: 7 % — ABNORMAL HIGH (ref 4.8–5.6)
Mean Plasma Glucose: 154.2 mg/dL

## 2021-09-27 LAB — CBG MONITORING, ED
Glucose-Capillary: 36 mg/dL — CL (ref 70–99)
Glucose-Capillary: 38 mg/dL — CL (ref 70–99)
Glucose-Capillary: 182 mg/dL — ABNORMAL HIGH (ref 70–99)
Glucose-Capillary: 127 mg/dL — ABNORMAL HIGH (ref 70–99)
Glucose-Capillary: 87 mg/dL (ref 70–99)
Glucose-Capillary: 53 mg/dL — ABNORMAL LOW (ref 70–99)
Glucose-Capillary: 184 mg/dL — ABNORMAL HIGH (ref 70–99)

## 2021-09-27 LAB — URINALYSIS, MICROSCOPIC (REFLEX)
Bacteria, UA: NONE SEEN
RBC / HPF: NONE SEEN RBC/hpf (ref 0–5)
Squamous Epithelial / HPF: NONE SEEN (ref 0–5)

## 2021-09-27 LAB — CBC
HCT: 37.5 % — ABNORMAL LOW (ref 39.0–52.0)
Hemoglobin: 11.9 g/dL — ABNORMAL LOW (ref 13.0–17.0)
MCH: 30 pg (ref 26.0–34.0)
MCHC: 31.7 g/dL (ref 30.0–36.0)
MCV: 94.5 fL (ref 80.0–100.0)
Platelets: 200 10*3/uL (ref 150–400)
RBC: 3.97 MIL/uL — ABNORMAL LOW (ref 4.22–5.81)
RDW: 14.6 % (ref 11.5–15.5)
WBC: 6.2 10*3/uL (ref 4.0–10.5)
nRBC: 0 % (ref 0.0–0.2)

## 2021-09-27 LAB — HEPATIC FUNCTION PANEL
ALT: 17 U/L (ref 0–44)
AST: 22 U/L (ref 15–41)
Albumin: 3.4 g/dL — ABNORMAL LOW (ref 3.5–5.0)
Alkaline Phosphatase: 46 U/L (ref 38–126)
Bilirubin, Direct: 0.3 mg/dL — ABNORMAL HIGH (ref 0.0–0.2)
Indirect Bilirubin: 0.8 mg/dL (ref 0.3–0.9)
Total Bilirubin: 1.1 mg/dL (ref 0.3–1.2)
Total Protein: 5.5 g/dL — ABNORMAL LOW (ref 6.5–8.1)

## 2021-09-27 LAB — URINALYSIS, ROUTINE W REFLEX MICROSCOPIC
Bilirubin Urine: NEGATIVE
Glucose, UA: >=500 mg/dL — AB
Ketones, ur: NEGATIVE mg/dL
Leukocytes,Ua: NEGATIVE
Nitrite: NEGATIVE
Protein, ur: NEGATIVE mg/dL
Specific Gravity, Urine: 1.02 (ref 1.005–1.030)
pH: 6 (ref 5.0–8.0)

## 2021-09-27 LAB — TSH: TSH: 3.715 u[IU]/mL (ref 0.350–4.500)

## 2021-09-27 LAB — BASIC METABOLIC PANEL
Anion gap: 11 (ref 5–15)
BUN: 26 mg/dL — ABNORMAL HIGH (ref 8–23)
CO2: 16 mmol/L — ABNORMAL LOW (ref 22–32)
Calcium: 9 mg/dL (ref 8.9–10.3)
Chloride: 112 mmol/L — ABNORMAL HIGH (ref 98–111)
Creatinine, Ser: 1.52 mg/dL — ABNORMAL HIGH (ref 0.61–1.24)
GFR, Estimated: 48 mL/min — ABNORMAL LOW (ref >60)
Glucose, Bld: 56 mg/dL — ABNORMAL LOW (ref 70–99)
Potassium: 3.9 mmol/L (ref 3.5–5.1)
Sodium: 139 mmol/L (ref 135–145)

## 2021-09-27 LAB — GLUCOSE, CAPILLARY: Glucose-Capillary: 288 mg/dL — ABNORMAL HIGH (ref 70–99)

## 2021-09-27 LAB — AMMONIA: Ammonia: 17 umol/L (ref 9–35)

## 2021-09-27 IMAGING — DX DG CHEST 1V PORT
1 series · 1 of 1 positions shown · non-contrast
Comparison: [DATE]

CLINICAL DATA: Weakness and dizziness.

EXAM:
PORTABLE CHEST 1 VIEW

[chest ap]
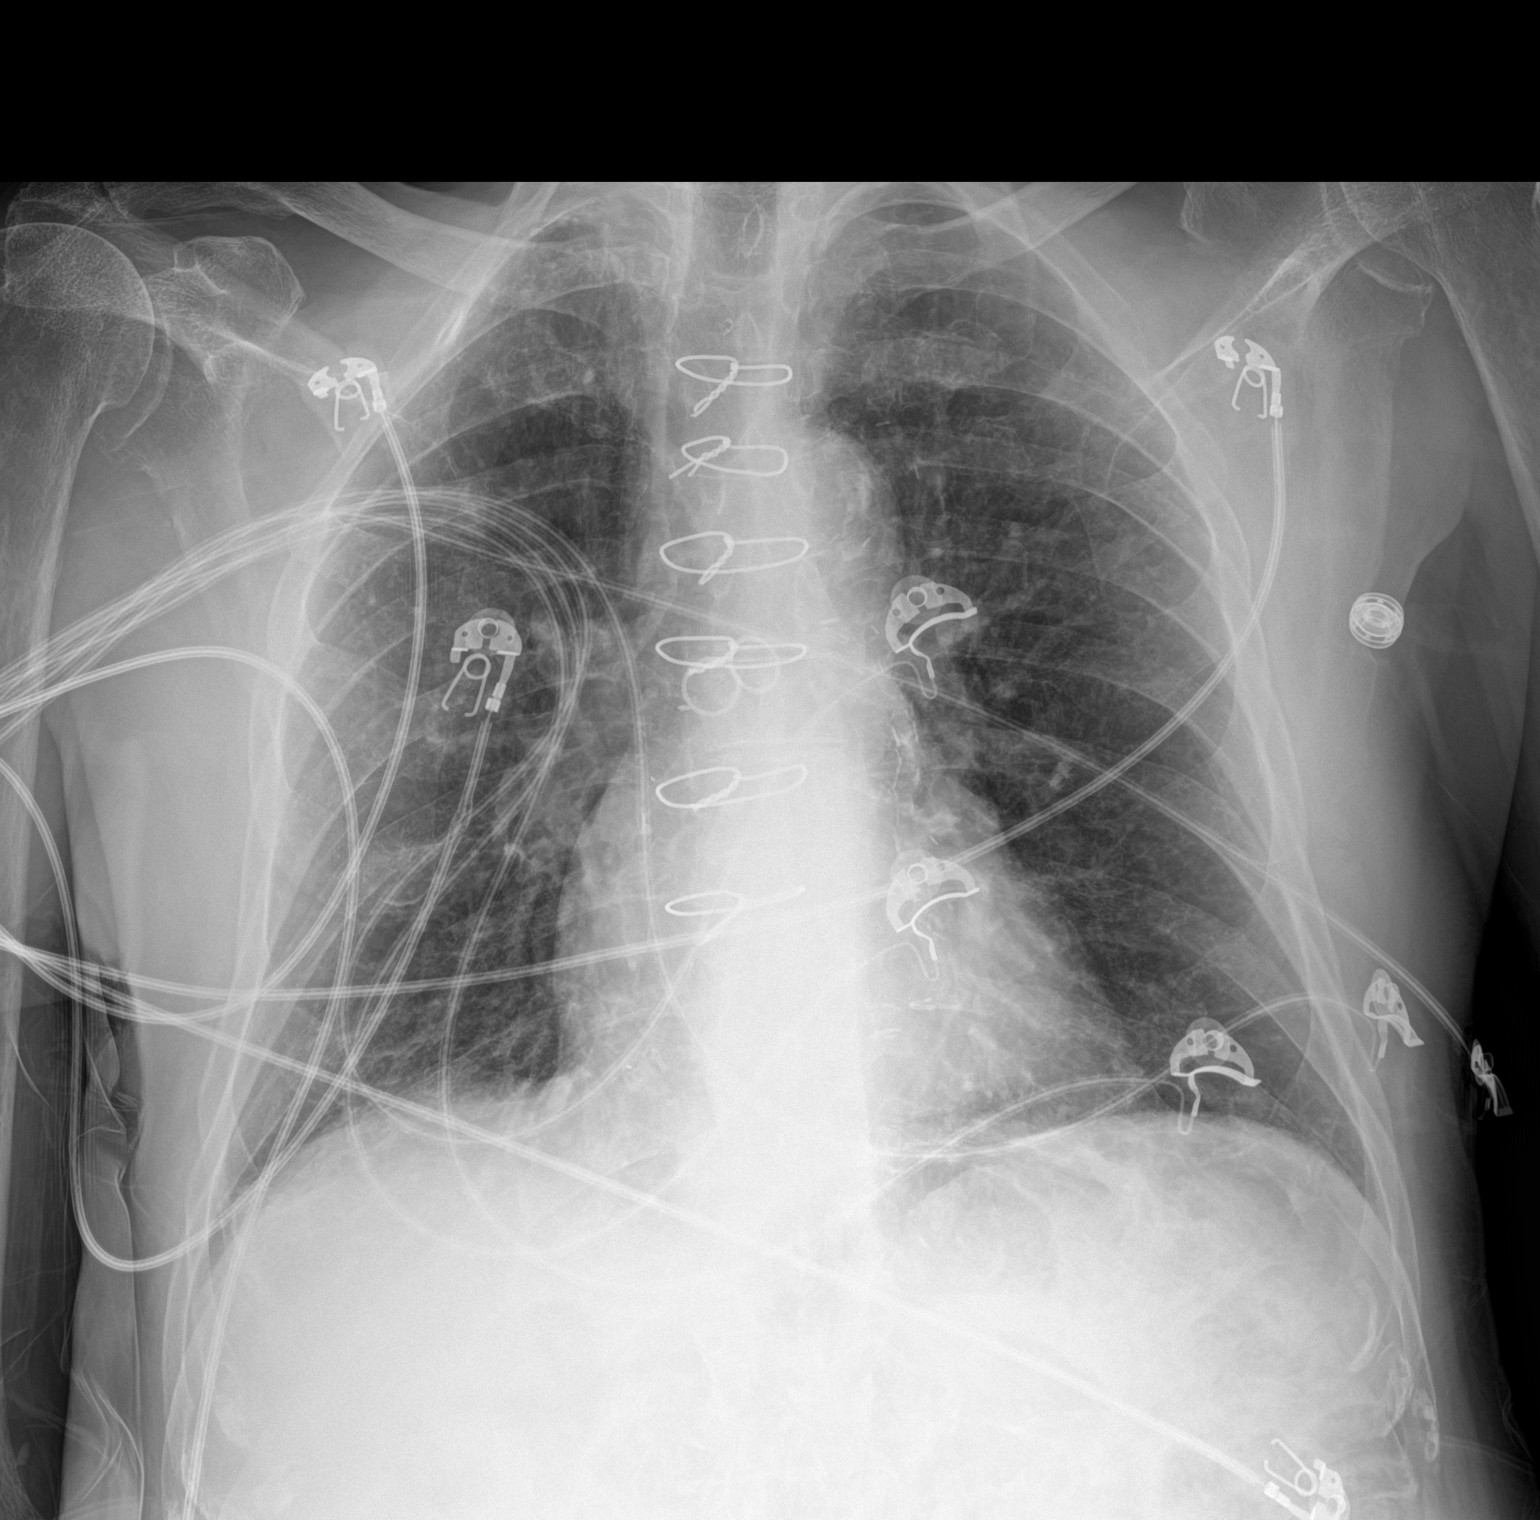

[1 of 1 positions shown; findings below may reference images not displayed]

FINDINGS: Bilateral emphysematous changes. No focal consolidation. Chronic
bilateral interstitial thickening. No pleural effusion or
pneumothorax. Heart and mediastinal contours are unremarkable. Prior
CABG.

No acute osseous abnormality.
IMPRESSION: 1. No acute cardiopulmonary disease.
2. COPD.

## 2021-09-27 MED ORDER — ALBUTEROL SULFATE (2.5 MG/3ML) 0.083% IN NEBU: 2.5000 mg | INHALATION_SOLUTION | Freq: Four times a day (QID) | RESPIRATORY_TRACT | Status: DC | PRN

## 2021-09-27 MED ORDER — SODIUM CHLORIDE 0.9 % IV BOLUS
1000.0000 mL | Freq: Once | INTRAVENOUS | Status: AC
  Administered 2021-09-27: 1000 mL via INTRAVENOUS

## 2021-09-27 MED ORDER — INSULIN ASPART 100 UNIT/ML IJ SOLN
0.0000 [IU] | Freq: Three times a day (TID) | INTRAMUSCULAR | Status: DC
  Administered 2021-09-29: 1 [IU] via SUBCUTANEOUS

## 2021-09-27 MED ORDER — ACETAMINOPHEN 325 MG PO TABS
650.0000 mg | ORAL_TABLET | Freq: Four times a day (QID) | ORAL | Status: DC | PRN
  Administered 2021-09-27: 650 mg via ORAL
  Filled 2021-09-27: qty 2

## 2021-09-27 MED ORDER — METOPROLOL TARTRATE 5 MG/5ML IV SOLN
5.0000 mg | INTRAVENOUS | Status: DC | PRN
  Administered 2021-09-27: 5 mg via INTRAVENOUS
  Filled 2021-09-27: qty 5

## 2021-09-27 MED ORDER — DEXTROSE 50 % IV SOLN
INTRAVENOUS | Status: AC
  Administered 2021-09-27: 50 mL via INTRAVENOUS
  Filled 2021-09-27: qty 50

## 2021-09-27 MED ORDER — ONDANSETRON HCL 4 MG/2ML IJ SOLN: 4.0000 mg | Freq: Four times a day (QID) | INTRAMUSCULAR | Status: DC | PRN

## 2021-09-27 MED ORDER — METOPROLOL SUCCINATE ER 25 MG PO TB24: 25.0000 mg | ORAL_TABLET | Freq: Every day | ORAL | Status: DC

## 2021-09-27 MED ORDER — ATORVASTATIN CALCIUM 40 MG PO TABS
40.0000 mg | ORAL_TABLET | Freq: Every day | ORAL | Status: DC
Start: 2021-09-28 — End: 2021-09-29
  Administered 2021-09-28 – 2021-09-29 (×2): 40 mg via ORAL
  Filled 2021-09-27 (×2): qty 1

## 2021-09-27 MED ORDER — DEXTROSE 10 % IV SOLN: INTRAVENOUS | Status: DC

## 2021-09-27 MED ORDER — SODIUM CHLORIDE 0.9 % IV SOLN: INTRAVENOUS | Status: DC

## 2021-09-27 MED ORDER — DEXTROSE 50 % IV SOLN: 1.0000 | Freq: Once | INTRAVENOUS | Status: AC

## 2021-09-27 MED ORDER — ONDANSETRON HCL 4 MG PO TABS: 4.0000 mg | ORAL_TABLET | Freq: Four times a day (QID) | ORAL | Status: DC | PRN

## 2021-09-27 MED ORDER — SODIUM CHLORIDE 0.9 % IV BOLUS
500.0000 mL | Freq: Once | INTRAVENOUS | Status: AC
  Administered 2021-09-27: 500 mL via INTRAVENOUS

## 2021-09-27 MED ORDER — DEXTROSE 50 % IV SOLN
50.0000 mL | Freq: Once | INTRAVENOUS | Status: AC
  Administered 2021-09-27: 50 mL via INTRAVENOUS
  Filled 2021-09-27: qty 50

## 2021-09-27 MED ORDER — UMECLIDINIUM BROMIDE 62.5 MCG/ACT IN AEPB
1.0000 | INHALATION_SPRAY | Freq: Every day | RESPIRATORY_TRACT | Status: DC
  Administered 2021-09-29: 1 via RESPIRATORY_TRACT
  Filled 2021-09-27: qty 7

## 2021-09-27 MED ORDER — METOPROLOL SUCCINATE ER 25 MG PO TB24
25.0000 mg | ORAL_TABLET | Freq: Every day | ORAL | Status: DC
  Administered 2021-09-27 – 2021-09-29 (×3): 25 mg via ORAL
  Filled 2021-09-27 (×3): qty 1

## 2021-09-27 MED ORDER — ACETAMINOPHEN 650 MG RE SUPP: 650.0000 mg | Freq: Four times a day (QID) | RECTAL | Status: DC | PRN

## 2021-09-27 MED ORDER — ENOXAPARIN SODIUM 40 MG/0.4ML IJ SOSY
40.0000 mg | PREFILLED_SYRINGE | INTRAMUSCULAR | Status: DC
  Administered 2021-09-27 – 2021-09-28 (×2): 40 mg via SUBCUTANEOUS
  Filled 2021-09-27 (×2): qty 0.4

## 2021-09-27 MED ORDER — CLOPIDOGREL BISULFATE 75 MG PO TABS
75.0000 mg | ORAL_TABLET | Freq: Every day | ORAL | Status: DC
Start: 2021-09-28 — End: 2021-09-29
  Administered 2021-09-28 – 2021-09-29 (×2): 75 mg via ORAL
  Filled 2021-09-27 (×2): qty 1

## 2021-09-27 MED ORDER — TIOTROPIUM BROMIDE MONOHYDRATE 18 MCG IN CAPS: 18.0000 ug | ORAL_CAPSULE | Freq: Every day | RESPIRATORY_TRACT | Status: DC

## 2021-09-27 MED ORDER — DEXTROSE 50 % IV SOLN: 1.0000 | INTRAVENOUS | Status: DC | PRN

## 2021-09-27 NOTE — Assessment & Plan Note (Signed)
Blood pressures elevated to 180/70.  Home medication regimen includes metoprolol succinate 25 mg daily and losartan 50 mg daily. -Continue metoprolol -Held losartan due to AKI.  Resume when medically appropriate.

## 2021-09-27 NOTE — ED Notes (Signed)
Dalphine Handing RN and DR.Tegeler made aware of CBG.

## 2021-09-27 NOTE — Assessment & Plan Note (Addendum)
Patient presented with glucose noted to be as low as 36.  Admitted to having intermittent low blood sugars in the 40s when he would wake up.  Last hemoglobin A1c was 6.7 in 2019.  Patient has been given an amp of D50 on the ED, but blood sugars were noted to drift back down to 56. -Admit to a telemetry bed  -Hypoglycemic protocols -Hold home oral medications and recommending discontinuation of glipizide due to history of hypoglycemic episodes. -Give additional amp of D50 -D10 IV fluids at 75 mL/h until blood sugars greater than 180 for 2 consecutive checks 4 hours apart.  Will then switch IV fluids to normal saline at 75 mL/h -Serial CBG monitoring -Will add on a very sensitive sliding scale insulin when medically appropriate

## 2021-09-27 NOTE — Assessment & Plan Note (Signed)
Chronic.  On admission hemoglobin 11.9 which appears higher than previous baseline hemoglobin.  Patient did not report any complaints of bleeding. -Recheck CBC tomorrow

## 2021-09-27 NOTE — ED Triage Notes (Signed)
Pt bib [facility] from home with complaints of weakness that started this morning upon waking up. Pt states that he felt weak and dizzy when he tried to get up out of bed. AOx4 EMS vitals: 172/84, 88 HR, 95% RA, 81 CBG

## 2021-09-27 NOTE — H&P (Signed)
History and Physical    Patient: Reginald Ayers IDH:[id number] DOB: 02-13-1951 DOA: 09/27/2021 DOS: the patient was seen and examined on 09/27/2021 PCP: Renford Dills, MD  Patient coming from: Home via EMS  Chief Complaint: Weakness   HPI: Reginald Ayers is a 71 y.o. male with medical history significant of hypertension, hyperlipidemia, CAD s/p CABG in 2010, diabetes mellitus type 2, emphysema, and history of tobacco abuse who presents due to weakness.  Normally patient gets around with use of a cane and lives with a roommate in his home.  Yesterday reports being in his normal state of health, but this morning when he woke was unable to move or get out of bed because he felt globally weak all over.  In talking to the patient he states that his blood sugars have intermittently been low in the 40s when he will wake up at home.  Patient reports that he has been taking glipizide, metformin, and Jardiance as prescribed.  Patient reports that he intermittently has some shortness of breath on exertion, productive cough, and nausea with vomiting.  Denies having any recent fever, chest pain diarrhea symptoms.  Upon admission into the emergency department patient was seen to be afebrile, pulse 87-108, respiration 14-20, blood pressures 129/67 180/70, and O2 saturation maintained on room air.  Labs significant for hemoglobin 11.9, CO2 16, BUN 26, creatinine 1.52, and glucoseas low as 36.  Urinalysis noted glucose and trace hemoglobin without significant signs of infection.  Bedbugs were removed from patient's clothing on initial evaluation and he was noted to have bug bites present.  He had been given 1.5 L of normal saline IV fluids and an amp of D50.  TRH called to admit.  Review of Systems: As mentioned in the history of present illness. All other systems reviewed and are negative. Past Medical History:  Diagnosis Date   Carotid artery disease (HCC)    L-ICA 100%, mod R-ICA dz   COPD (chronic obstructive  pulmonary disease) (HCC)    Coronary artery disease    s/p CABG February 2010 by Dr. Andrey Spearman   Diabetes [facility] [place])    Emphysema lung [facility])    Hyperlipidemia    Hypertension    Peripheral arterial disease (HCC)    post left common iliac and right SFA stenting remotely   Tobacco abuse        Past Surgical History:  Procedure Laterality Date   ABDOMINAL AORTOGRAM N/A 04/20/2017   Procedure: ABDOMINAL AORTOGRAM;  Surgeon: Nada Libman, MD;  Location: [facility];  Service: Cardiovascular;  Laterality: N/A;   ABDOMINAL AORTOGRAM W/LOWER EXTREMITY Right 12/08/2016   Procedure: Abdominal Aortogram w/Lower Extremity;  Surgeon: Nada Libman, MD;  Location: [facility];  Service: Cardiovascular;  Laterality: Right;   ABDOMINAL AORTOGRAM W/LOWER EXTREMITY N/A 09/07/2017   Procedure: ABDOMINAL AORTOGRAM W/LOWER EXTREMITY;  Surgeon: Nada Libman, MD;  Location: [facility];  Service: Cardiovascular;  Laterality: N/A;   CARDIAC CATHETERIZATION  2010   CAROTID ANGIOGRAM  2014   CORONARY ARTERY BYPASS GRAFT  2010   LIMA-LAD, Lrad-OM1, SVG-RI, SVG-AM-dRCA   DOPPLER ECHOCARDIOGRAPHY  2010   LOWER EXTREMITY ANGIOGRAM  11/2006   left common femoral endarterectomy and patch angioplasty:The mid right SFA was angioplastied with a 4 x 8   LOWER EXTREMITY ANGIOGRAM  2003   left common iliac artery stenting by Dr. Erlene Quan   LOWER EXTREMITY ANGIOGRAPHY Right 04/20/2017   Procedure: Lower Extremity Angiography;  Surgeon:  Nada LibmanBrabham, Vance W, MD;  Location: [facility];  Service: Cardiovascular;  Laterality: Right;   NM MYOVIEW LTD  2015   PERIPHERAL VASCULAR ATHERECTOMY Right 12/08/2016   Procedure: Peripheral Vascular Atherectomy;  Surgeon: Nada LibmanVance W Brabham, MD;  Location: [facility];  Service: Cardiovascular;  Laterality: Right;   PERIPHERAL VASCULAR BALLOON ANGIOPLASTY Right 12/08/2016   Procedure: Peripheral Vascular Balloon Angioplasty;  Surgeon: Nada LibmanVance W Brabham,  MD;  Location: [facility];  Service: Cardiovascular;  Laterality: Right;   PERIPHERAL VASCULAR BALLOON ANGIOPLASTY Right 04/20/2017   Procedure: PERIPHERAL VASCULAR BALLOON ANGIOPLASTY;  Surgeon: Nada LibmanBrabham, Vance W, MD;  Location: [facility];  Service: Cardiovascular;  Laterality: Right;  SFA   PERIPHERAL VASCULAR CATHETERIZATION N/A 09/22/2016   Procedure: Abdominal Aortogram w/ bilateral Lower Extremity Runoff;  Surgeon: Nada LibmanVance W Brabham, MD;  Location: [facility];  Service: Cardiovascular;  Laterality: N/A;   PERIPHERAL VASCULAR CATHETERIZATION Right 09/22/2016   Procedure: Peripheral Vascular Intervention;  Surgeon: Nada LibmanVance W Brabham, MD;  Location: [facility];  Service: Cardiovascular;  Laterality: Right;   Social History:  reports that he has been smoking cigarettes. His smokeless tobacco use includes chew. He reports that he does not drink alcohol and does not use drugs.  Allergies  Allergen Reactions   Actos [Pioglitazone] Shortness Of Breath    Leg swelling    Lisinopril Cough    Family History  Problem Relation Age of Onset   Heart attack Mother    CVA Mother    Heart disease Mother    Diabetes Sister    Hypertension Sister     Prior to Admission medications   Medication Sig Start Date End Date Taking? Authorizing Provider  albuterol (VENTOLIN HFA) 108 (90 Base) MCG/ACT inhaler SMARTSIG:2 Puff(s) By Mouth Every 4 Hours PRN 01/18/20   [provider]  atorvastatin (LIPITOR) 40 MG tablet Take 1 tablet (40 mg total) by mouth daily. 07/30/21 10/28/21  Runell GessBerry, Jonathan J, MD  B Complex Vitamins (B COMPLEX PO) Take 1 tablet by mouth 2 (two) times a week.     [provider]  clopidogrel (PLAVIX) 75 MG tablet Take 75 mg by mouth daily.  07/24/13   [provider]  Cyanocobalamin (VITAMIN B 12 PO) Take by mouth.    [provider]  doxycycline (ADOXA) 100 MG tablet Take 100 mg by mouth 2 (two) times daily. 01/19/20   [provider]  empagliflozin (JARDIANCE) 25 MG TABS tablet Take 25 mg by mouth daily.    [provider]  furosemide (LASIX) 40 MG tablet Take 40 mg by mouth daily as needed for edema. Patient not taking: Reported on 07/30/2021    [provider]  glipiZIDE (GLUCOTROL XL) 10 MG 24 hr tablet Take 10 mg by mouth 2 (two) times daily.  07/24/13   [provider]  Homeopathic Products (LEG CRAMP RELIEF SL) Place 2 tablets under the tongue at bedtime as needed (leg cramps).     [provider]  ibuprofen (ADVIL,MOTRIN) 200 MG tablet Take 800 mg by mouth every 8 (eight) hours as needed for moderate pain.    [provider]  IRON PO Take 1 tablet by mouth 4 (four) times a week.     [provider]  losartan (COZAAR) 50 MG tablet 1 tablet 07/10/21   [provider]  metFORMIN (GLUCOPHAGE) 500 MG tablet Take 1,000 mg by mouth 2 (two) times daily with a meal.  07/24/13   [provider]  metoprolol succinate (TOPROL-XL) 25 MG 24 hr tablet TAKE 1 TABLET(25 MG) BY MOUTH DAILY 07/29/20   Runell Gess, MD  NITROSTAT 0.4 MG SL tablet Take 0.4 mg by mouth every 5 (five) minutes as needed for chest pain.  12/18/14   [provider]  Polyvinyl Alcohol-Povidone (MURINE TEARS FOR DRY EYES OP) Apply 1 drop to eye 3 (three) times daily as needed (dry eyes).    [provider]  potassium chloride (K-DUR) 10 MEQ tablet Take 10 mEq by mouth daily.    [provider]  Pseudoeph-Doxylamine-DM-APAP (NYQUIL PO) Take 1 Dose by mouth at bedtime.     [provider]  SPIRIVA HANDIHALER 18 MCG inhalation capsule Place 18 mcg into inhaler and inhale daily at 8 pm.  08/16/13   [provider]  traMADol (ULTRAM) 50 MG tablet Take 50 mg by mouth 3 (three) times daily as needed for severe pain.  08/16/13   [provider]    Physical Exam: Vitals:   09/27/21 1300 09/27/21 1315 09/27/21 1330 09/27/21 1400   BP: 130/82 129/67 140/60 (!) 172/79  Pulse: 96 (!) 101 94 (!) 108  Resp: 19 19 19 18   Temp:      TempSrc:      SpO2: 100% 96% 98% 100%  Weight:      Height:         Data Reviewed: Appreciated new AKI with creatinine elevated up to 1.52 where previous baseline had been around 1.  Assessment and Plan: * Type 2 diabetes mellitus with hypoglycemia (HCC)- (present on admission) Patient presented with glucose noted to be as low as 36.  Admitted to having intermittent low blood sugars in the 40s when he would wake up.  Last hemoglobin A1c was 6.7 in 2019.  Patient has been given an amp of D50 on the ED, but blood sugars were noted to drift back down to 56. -Admit to a telemetry bed  -Hypoglycemic protocols -Hold home oral medications and recommending discontinuation of glipizide due to history of hypoglycemic episodes. -Give additional amp of D50 -D10 IV fluids at 75 mL/h until blood sugars greater than 180 for 2 consecutive checks 4 hours apart.  Will then switch IV fluids to normal saline at 75 mL/h -Serial CBG monitoring -Will add on a very sensitive sliding scale insulin when medically appropriate  Weakness Patient reports being unable to get up this morning and was globally weak without focal deficit ordered.  Patient reports feeling better after given IV fluids and dextrose. -Neurochecks -PT/OT to evaluate and treat  AKI (acute kidney injury) (HCC)- (present on admission) On admission creatinine elevated up to 1.52 with BUN 26.  Kidney function had been previously normal in 2019 with creatinine 0.98.  Urinalysis was positive for glucose and trace hemoglobin without significant signs of infection. -Monitor intake and output -Avoid nephrotoxic agents -Continue IV fluids -Recheck kidney function in a.m.  Hx of CABG Feb 2010 -Continue Plavix and statin  Bedbug bite Patient was noted to have multiple bites upper extremities and had bedbugs found in his clothing. -Contact  precautions  Normocytic anemia- (present on admission) Chronic.  On admission hemoglobin 11.9 which appears higher than previous baseline hemoglobin.  Patient did not report any complaints of bleeding. -Recheck CBC tomorrow  COPD (chronic obstructive pulmonary disease) (HCC) Stable. -Continue Spiriva inhaler -Albuterol nebs as needed  Tobacco abuse- (present on admission) Patient reports that he only smokes a cigarette here and  there at this time.  Essential hypertension- (present on admission) Blood pressures elevated to 180/70.  Home medication regimen includes metoprolol succinate 25 mg daily and losartan 50 mg daily. -Continue metoprolol -Held losartan due to AKI.  Resume when medically appropriate.   Med reconciliation not completed at this time and reordered meds based off last available fill date.    Advance Care Planning:   Code Status: Full Code  Consults: None  Family Communication: None  Severity of Illness: The appropriate patient status for this patient is INPATIENT. Inpatient status is judged to be reasonable and necessary in order to provide the required intensity of service to ensure the patient's safety. The patient's presenting symptoms, physical exam findings, and initial radiographic and laboratory data in the context of their chronic comorbidities is felt to place them at high risk for further clinical deterioration. Furthermore, it is not anticipated that the patient will be medically stable for discharge from the hospital within 2 midnights of admission.   * I certify that at the point of admission it is my clinical judgment that the patient will require inpatient hospital care spanning beyond 2 midnights from the point of admission due to high intensity of service, high risk for further deterioration and high frequency of surveillance required.*  Author: Clydie Braunondell A Shannin Naab, MD 09/27/2021 3:07 PM  For on call review [email or web address].

## 2021-09-27 NOTE — Assessment & Plan Note (Signed)
-   Continue Plavix and statin 

## 2021-09-27 NOTE — Assessment & Plan Note (Signed)
Patient was noted to have multiple bites upper extremities and had bedbugs found in his clothing. -Contact precautions

## 2021-09-27 NOTE — ED Notes (Signed)
Pt decontaminated of Bedbugs. Sample collected. Belongings placed in bags and tied shut.

## 2021-09-27 NOTE — Assessment & Plan Note (Addendum)
On admission creatinine elevated up to 1.52 with BUN 26.  Kidney function had been previously normal in 2019 with creatinine 0.98.  Urinalysis was positive for glucose and trace hemoglobin without significant signs of infection. -Monitor intake and output -Avoid nephrotoxic agents -Continue IV fluids -Recheck kidney function in a.m.

## 2021-09-27 NOTE — Assessment & Plan Note (Signed)
Patient reports being unable to get up this morning and was globally weak without focal deficit ordered.  Patient reports feeling better after given IV fluids and dextrose. -Neurochecks -PT/OT to evaluate and treat

## 2021-09-27 NOTE — Assessment & Plan Note (Signed)
Patient reports that he only smokes a cigarette here and there at this time.

## 2021-09-27 NOTE — Assessment & Plan Note (Signed)
Stable. -Continue Spiriva inhaler -Albuterol nebs as needed

## 2021-09-27 NOTE — ED Notes (Signed)
ED Provider at bedside. 

## 2021-09-27 NOTE — ED Provider Notes (Signed)
[facility] EMERGENCY DEPARTMENT Provider Note   CSN: [id number] Arrival date & time: 09/27/21  1058     History  No chief complaint on file.   Reginald Ayers is a 71 y.o. male.  The history is provided by the patient and medical records. No language interpreter was used.  Altered Mental Status Presenting symptoms: no confusion, no disorientation and no lethargy   Severity:  Moderate Most recent episode:  Today Episode history:  Continuous Timing:  Constant Progression:  Unchanged Chronicity:  New Associated symptoms: light-headedness   Associated symptoms: no abdominal pain, no agitation, no fever, no headaches, no nausea, no palpitations, no rash, no vomiting and no weakness       Home Medications Prior to Admission medications   Medication Sig Start Date End Date Taking? Authorizing Provider  albuterol (VENTOLIN HFA) 108 (90 Base) MCG/ACT inhaler SMARTSIG:2 Puff(s) By Mouth Every 4 Hours PRN 01/18/20   [provider]  atorvastatin (LIPITOR) 40 MG tablet Take 1 tablet (40 mg total) by mouth daily. 07/30/21 10/28/21  Runell Gess, MD  B Complex Vitamins (B COMPLEX PO) Take 1 tablet by mouth 2 (two) times a week.     [provider]  clopidogrel (PLAVIX) 75 MG tablet Take 75 mg by mouth daily.  07/24/13   [provider]  Cyanocobalamin (VITAMIN B 12 PO) Take by mouth.    [provider]  doxycycline (ADOXA) 100 MG tablet Take 100 mg by mouth 2 (two) times daily. 01/19/20   [provider]  empagliflozin (JARDIANCE) 25 MG TABS tablet Take 25 mg by mouth daily.    [provider]  furosemide (LASIX) 40 MG tablet Take 40 mg by mouth daily as needed for edema. Patient not taking: Reported on 07/30/2021    [provider]  glipiZIDE (GLUCOTROL XL) 10 MG 24 hr tablet Take 10 mg by mouth 2 (two) times daily.  07/24/13   [provider]  Homeopathic Products (LEG CRAMP RELIEF SL) Place 2 tablets  under the tongue at bedtime as needed (leg cramps).     [provider]  ibuprofen (ADVIL,MOTRIN) 200 MG tablet Take 800 mg by mouth every 8 (eight) hours as needed for moderate pain.    [provider]  IRON PO Take 1 tablet by mouth 4 (four) times a week.     [provider]  losartan (COZAAR) 50 MG tablet 1 tablet 07/10/21   [provider]  metFORMIN (GLUCOPHAGE) 500 MG tablet Take 1,000 mg by mouth 2 (two) times daily with a meal.  07/24/13   [provider]  metoprolol succinate (TOPROL-XL) 25 MG 24 hr tablet TAKE 1 TABLET(25 MG) BY MOUTH DAILY 07/29/20   Runell Gess, MD  NITROSTAT 0.4 MG SL tablet Take 0.4 mg by mouth every 5 (five) minutes as needed for chest pain.  12/18/14   [provider]  Polyvinyl Alcohol-Povidone (MURINE TEARS FOR DRY EYES OP) Apply 1 drop to eye 3 (three) times daily as needed (dry eyes).    [provider]  potassium chloride (K-DUR) 10 MEQ tablet Take 10 mEq by mouth daily.    [provider]  Pseudoeph-Doxylamine-DM-APAP (NYQUIL PO) Take 1 Dose by mouth at bedtime.     [provider]  SPIRIVA HANDIHALER 18 MCG inhalation capsule Place 18 mcg into inhaler and inhale daily at 8 pm.  08/16/13   [provider]  traMADol (ULTRAM) 50 MG tablet Take 50 mg by  mouth 3 (three) times daily as needed for severe pain.  08/16/13   [provider]      Allergies    Actos [pioglitazone] and Lisinopril    Review of Systems   Review of Systems  Constitutional:  Positive for fatigue. Negative for chills and fever.  HENT:  Negative for congestion.   Respiratory:  Positive for cough. Negative for chest tightness and shortness of breath.   Cardiovascular:  Negative for chest pain and palpitations.  Gastrointestinal:  Negative for abdominal pain, constipation, diarrhea, nausea and vomiting.  Genitourinary:  Negative for flank pain and frequency.  Musculoskeletal:  Negative for  back pain, neck pain and neck stiffness.  Skin:  Negative for rash and wound.  Neurological:  Positive for light-headedness. Negative for syncope, weakness, numbness and headaches.  Psychiatric/Behavioral:  Negative for agitation and confusion.   All other systems reviewed and are negative.  Physical Exam Updated Vital Signs BP (!) 180/70 (BP Location: Right Arm)    Pulse 93    Temp 98.3 F (36.8 C) (Oral)    Resp 18    Ht 5\' 1"  (1.549 m)    Wt 59 kg    SpO2 95%    BMI 24.56 kg/m  Physical Exam Vitals and nursing note reviewed.  Constitutional:      General: He is not in acute distress.    Appearance: He is well-developed. He is not ill-appearing, toxic-appearing or diaphoretic.  HENT:     Head: Normocephalic and atraumatic.     Nose: Nose normal. No congestion.     Mouth/Throat:     Mouth: Mucous membranes are dry.  Eyes:     Extraocular Movements: Extraocular movements intact.     Conjunctiva/sclera: Conjunctivae normal.     Pupils: Pupils are equal, round, and reactive to light.  Cardiovascular:     Rate and Rhythm: Normal rate and regular rhythm.     Heart sounds: No murmur heard. Pulmonary:     Effort: Pulmonary effort is normal. No respiratory distress.     Breath sounds: Normal breath sounds. No wheezing, rhonchi or rales.  Chest:     Chest wall: No tenderness.  Abdominal:     General: Abdomen is flat.     Palpations: Abdomen is soft.     Tenderness: There is no abdominal tenderness. There is no right CVA tenderness, left CVA tenderness, guarding or rebound.  Musculoskeletal:        General: No swelling.     Cervical back: Neck supple.  Skin:    General: Skin is warm and dry.     Capillary Refill: Capillary refill takes less than 2 seconds.     Findings: No erythema.  Neurological:     General: No focal deficit present.     Mental Status: He is alert. Mental status is at baseline.     Sensory: No sensory deficit.     Motor: No weakness.  Psychiatric:         Mood and Affect: Mood normal.    ED Results / Procedures / Treatments   Labs (all labs ordered are listed, but only abnormal results are displayed) Labs Reviewed  BASIC METABOLIC PANEL - Abnormal; Notable for the following components:      Result Value   Chloride 112 (*)    CO2 16 (*)    Glucose, Bld 56 (*)    BUN 26 (*)    Creatinine, Ser 1.52 (*)    GFR, Estimated 48 (*)  All other components within normal limits  CBC - Abnormal; Notable for the following components:   RBC 3.97 (*)    Hemoglobin 11.9 (*)    HCT 37.5 (*)    All other components within normal limits  URINALYSIS, ROUTINE W REFLEX MICROSCOPIC - Abnormal; Notable for the following components:   Glucose, UA >=500 (*)    Hgb urine dipstick TRACE (*)    All other components within normal limits  HEPATIC FUNCTION PANEL - Abnormal; Notable for the following components:   Total Protein 5.5 (*)    Albumin 3.4 (*)    Bilirubin, Direct 0.3 (*)    All other components within normal limits  CBG MONITORING, ED - Abnormal; Notable for the following components:   Glucose-Capillary 36 (*)    All other components within normal limits  CBG MONITORING, ED - Abnormal; Notable for the following components:   Glucose-Capillary 38 (*)    All other components within normal limits  CBG MONITORING, ED - Abnormal; Notable for the following components:   Glucose-Capillary 182 (*)    All other components within normal limits  CBG MONITORING, ED - Abnormal; Notable for the following components:   Glucose-Capillary 127 (*)    All other components within normal limits  URINE CULTURE  TSH  AMMONIA  URINALYSIS, MICROSCOPIC (REFLEX)  CBG MONITORING, ED    EKG EKG Interpretation  Date/Time:  Saturday September 27 2021 11:11:42 EST Ventricular Rate:  83 PR Interval:  206 QRS Duration: 113 QT Interval:  378 QTC Calculation: 445 R Axis:   93 Text Interpretation: Sinus rhythm IRBBB and LPFB Anteroseptal infarct, age indeterminate  when compared to prior, slower rate. No STEMI Confirmed by Theda Belfastegeler, Chris ([id number]) on 09/27/2021 11:17:37 AM  Radiology DG Chest Portable 1 View  Result Date: 09/27/2021 CLINICAL DATA:  Weakness and dizziness. EXAM: PORTABLE CHEST 1 VIEW COMPARISON:  07/10/2021 FINDINGS: Bilateral emphysematous changes. No focal consolidation. Chronic bilateral interstitial thickening. No pleural effusion or pneumothorax. Heart and mediastinal contours are unremarkable. Prior CABG. No acute osseous abnormality. IMPRESSION: 1. No acute cardiopulmonary disease. 2. COPD. Electronically Signed   By: Elige KoHetal  Patel M.D.   On: 09/27/2021 12:07    Procedures Procedures    Medications Ordered in ED Medications  sodium chloride 0.9 % bolus 500 mL (has no administration in time range)  dextrose 50 % solution 50 mL (50 mLs Intravenous Given 09/27/21 1128)  sodium chloride 0.9 % bolus 1,000 mL (0 mLs Intravenous Stopped 09/27/21 1349)    ED Course/ Medical Decision Making/ A&P                           Medical Decision Making Amount and/or Complexity of Data Reviewed Labs: ordered. Radiology: ordered.  Risk Prescription drug management. Decision regarding hospitalization.   Reginald Ayers is a 71 y.o. male with past medical history significant for CAD status post CABG, hypertension, hyperlipidemia, diabetes, peripheral vascular disease, and vertigo who presents with fatigue and generalized weakness.  According to patient, he was doing well and feeling completely normal yesterday but then this morning could not get out of bed.  He feels weak all over and tired and fatigued.  He denies any fevers, chills, congestion, chest pain, abdominal pain, nausea, vomiting, constipation, diarrhea, or urinary changes.  He does have a subtle dry cough but otherwise no production reported.  He was brought in by EMS and was told that he did not have any significant vital  sign abnormalities in route, just feeling very drained fatigued and  tired.  He lives alone and when he could not get out of bed call for help.  On arrival, patient had clear breath sounds.  Chest and abdomen nontender.  He is moving all extremities.  Legs are nontender and not significantly edematous.  He does have small red spots across his body which he thinks are from either fleas or bedbugs.  He was noted to have bugs on his external clothing initially.  Pupils were symmetric and reactive normal extraocular movements.  Mouth was very dry on exam.  We will give the patient's moment of fluids given the dry mouth and feeling fatigued and lightheaded and dehydrated.  Will get screening labs including glucose.  Glucose was found to be in the 30s.  He was given D50 and improved.  Patient reports he did not eat or drink anything this morning and has not taken any of his glucose medications this morning.  We will do a work-up to look for occult infection leading to this hypoglycemia and will trend his glucoses.  If it becomes downtrending again despite not taking any diabetic medications, anticipate he may need admission for recurrent hypoglycemia and inability to ambulate with fatigue.  Will get chest x-ray, urine, and other labs.  Patient resting comfortably otherwise.  2:59 PM Work-up continue to return.  Patient's urinalysis does not show convincing evidence of infection but a culture was sent.  Chest x-ray did not show pneumonia.  Patient's glucose rose after the D50 but then continued to steadily decline.  Other labs show evidence of AKI.  Given the patient's age of 78, AKI, fatigue/altered mental status, and continuously dropping glucose despite not taking any of his glucose medications today, I am concerned about him going home.  We will call for admission for rehydration, glucose monitoring, and helping to get his glucose management under more stable control.          Final Clinical Impression(s) / ED Diagnoses Final diagnoses:  Hypoglycemia  AKI  (acute kidney injury) (HCC)  Other fatigue     Clinical Impression: 1. Hypoglycemia   2. AKI (acute kidney injury) (HCC)   3. Other fatigue     Disposition: Admit  This note was prepared with assistance of Dragon voice recognition software. Occasional wrong-word or sound-a-like substitutions may have occurred due to the inherent limitations of voice recognition software.      Derwin Reddy, Canary Brim, MD 09/27/21 1501

## 2021-09-28 DIAGNOSIS — E11649 Type 2 diabetes mellitus with hypoglycemia without coma: Secondary | ICD-10-CM | POA: Diagnosis not present

## 2021-09-28 LAB — URINE CULTURE: Culture: NO GROWTH

## 2021-09-28 LAB — GLUCOSE, CAPILLARY
Glucose-Capillary: 149 mg/dL — ABNORMAL HIGH (ref 70–99)
Glucose-Capillary: 121 mg/dL — ABNORMAL HIGH (ref 70–99)
Glucose-Capillary: 132 mg/dL — ABNORMAL HIGH (ref 70–99)

## 2021-09-28 LAB — BASIC METABOLIC PANEL
Anion gap: 10 (ref 5–15)
BUN: 18 mg/dL (ref 8–23)
CO2: 16 mmol/L — ABNORMAL LOW (ref 22–32)
Calcium: 8.2 mg/dL — ABNORMAL LOW (ref 8.9–10.3)
Chloride: 111 mmol/L (ref 98–111)
Creatinine, Ser: 1.42 mg/dL — ABNORMAL HIGH (ref 0.61–1.24)
GFR, Estimated: 53 mL/min — ABNORMAL LOW (ref >60)
Glucose, Bld: 96 mg/dL (ref 70–99)
Potassium: 3.5 mmol/L (ref 3.5–5.1)
Sodium: 137 mmol/L (ref 135–145)

## 2021-09-28 LAB — CBC
HCT: 36 % — ABNORMAL LOW (ref 39.0–52.0)
Hemoglobin: 11.9 g/dL — ABNORMAL LOW (ref 13.0–17.0)
MCH: 30.4 pg (ref 26.0–34.0)
MCHC: 33.1 g/dL (ref 30.0–36.0)
MCV: 92.1 fL (ref 80.0–100.0)
Platelets: 166 10*3/uL (ref 150–400)
RBC: 3.91 MIL/uL — ABNORMAL LOW (ref 4.22–5.81)
RDW: 14.6 % (ref 11.5–15.5)
WBC: 6 10*3/uL (ref 4.0–10.5)
nRBC: 0 % (ref 0.0–0.2)

## 2021-09-28 NOTE — Progress Notes (Signed)
Chief Complaint: Weakness   HPI: Reginald Ayers is a 71 y.o. male with medical history significant of hypertension, hyperlipidemia, CAD s/p CABG in 2010, diabetes mellitus type 2, emphysema, and history of tobacco abuse who presents due to weakness.  Normally patient gets around with use of a cane and lives with a roommate in his home.  Yesterday reports being in his normal state of health, but this morning when he woke was unable to move or get out of bed because he felt globally weak all over.  In talking to the patient he states that his blood sugars have intermittently been low in the 40s when he will wake up at home.  Patient reports that he has been taking glipizide, metformin, and Jardiance as prescribed.  Patient reports that he intermittently has some shortness of breath on exertion, productive cough, and nausea with vomiting.  Denies having any recent fever, chest pain diarrhea symptoms.  Upon admission into the emergency department patient was seen to be afebrile, pulse 87-108, respiration 14-20, blood pressures 129/67 180/70, and O2 saturation maintained on room air.  Labs significant for hemoglobin 11.9, CO2 16, BUN 26, creatinine 1.52, and glucoseas low as 36.  Urinalysis noted glucose and trace hemoglobin without significant signs of infection.  Bedbugs were removed from patient's clothing on initial evaluation and he was noted to have bug bites present.  He had been given 1.5 L of normal saline IV fluids and an amp of D50.  TRH called to admit.  Subjective Patient back to baseline status hypoglycemia resolved eating well  Physical Exam: Vitals:   09/27/21 2208 09/28/21 0538 09/28/21 0544 09/28/21 0835  BP: (!) 156/92 (!) 121/59 (!) 121/59 124/73  Pulse: 92 85 85 85  Resp:  18  20  Temp:  98.7 F (37.1 C) 98.7 F (37.1 C) 98.6 F (37 C)  TempSrc:  Oral Oral Oral  SpO2:  97% 97% 97%  Weight:      Height:         Data Reviewed: Appreciated new AKI with creatinine elevated  up to 1.52 where previous baseline had been around 1.  Assessment and Plan: * Type 2 diabetes mellitus with hypoglycemia (HCC)- (present on admission) Patient presented with glucose noted to be as low as 36.  Admitted to having intermittent low blood sugars in the 40s when he would wake up.  Last hemoglobin A1c was 6.7 in 2019.  Patient has been given an amp of D50 on the ED, but blood sugars were noted to drift back down to 56. -Admit to a telemetry bed  -Hypoglycemic protocols -Hold home oral medications and recommending discontinuation of glipizide due to history of hypoglycemic episodes. -Give additional amp of D50 -D10 IV fluids at 75 mL/h until blood sugars greater than 180 for 2 consecutive checks 4 hours apart.  Will then switch IV fluids to normal saline at 75 mL/h -Serial CBG monitoring -Will add on a very sensitive sliding scale insulin when medically appropriate  Bedbug bite Patient was noted to have multiple bites upper extremities and had bedbugs found in his clothing. -Contact precautions  Weakness Patient reports being unable to get up this morning and was globally weak without focal deficit ordered.  Patient reports feeling better after given IV fluids and dextrose. -Neurochecks -PT/OT to evaluate and treat  Normocytic anemia- (present on admission) Chronic.  On admission hemoglobin 11.9 which appears higher than previous baseline hemoglobin.  Patient did not report any complaints of bleeding. -Recheck CBC  tomorrow  AKI (acute kidney injury) (HCC)- (present on admission) On admission creatinine elevated up to 1.52 with BUN 26.  Kidney function had been previously normal in 2019 with creatinine 0.98.  Urinalysis was positive for glucose and trace hemoglobin without significant signs of infection. -Monitor intake and output -Avoid nephrotoxic agents -Continue IV fluids -Recheck kidney function in a.m.  COPD (chronic obstructive pulmonary disease)  (HCC) Stable. -Continue Spiriva inhaler -Albuterol nebs as needed  Tobacco abuse- (present on admission) Patient reports that he only smokes a cigarette here and there at this time.  Essential hypertension- (present on admission) Blood pressures elevated to 180/70.  Home medication regimen includes metoprolol succinate 25 mg daily and losartan 50 mg daily. -Continue metoprolol -Held losartan due to AKI.  Resume when medically appropriate.  Hx of CABG Feb 2010 -Continue Plavix and statin   PT recommending short-term rehab.  Consult case management.  Patient stable for discharge.  Hypoglycemia resolved.    Author: Haydee Monica, MD 09/28/2021 11:01 AM

## 2021-09-28 NOTE — Evaluation (Signed)
Physical Therapy Evaluation Patient Details Name: Reginald Ayers MRN: [id number] DOB: Oct 02, 1950 Today's Date: 09/28/2021  History of Present Illness  The pt is a 71 yo male presenting 2/4 with generalized weakness, and found to have blood glucose of 30. PMH includes: COPD, CAD s/p CABG, HTN, HLD, DM II, PVD, and vertigo.   Clinical Impression  Pt in bed upon arrival of PT, agreeable to evaluation at this time. Prior to admission the pt was living in a home with roommates where he reports intermittent use of cane for mobility (depending on pain levels), but is otherwise independent with self-care and caring for his 6 dogs. The pt now presents with limitations in functional mobility, strength, power, endurance, stability, and awareness due to above dx, and will continue to benefit from skilled PT to address these deficits. The pt demos generalized weakness requiring increased time, use of bed rails, and HOB raised to complete transition to sitting EOB, and minG when directly cued for use of UE to stand or minA to power up to standing. The pt also had x1 posterior LOB requiring modA to steady as he made no moves to regain balance or take reactive step to regain balance at this time. The pt's situation is further complicated by decreased insight to deficits or need for assist, he was soiled of BM upon arrival of therapists and unaware, and continues to report he is likely safe to return home without need for DME despite LOB while using RW at the time of this evaluation. Will continue to benefit from skilled PT acutely and following return home to improve safety with mobility, independence and safety of self-care, and strength to perform functional transfers without assist.         Recommendations for follow up therapy are one component of a multi-disciplinary discharge planning process, led by the attending physician.  Recommendations may be updated based on patient status, additional functional criteria and  insurance authorization.  Follow Up Recommendations Skilled nursing-short term rehab (<3 hours/day)    Assistance Recommended at Discharge Frequent or constant Supervision/Assistance  Patient can return home with the following  A little help with walking and/or transfers;A lot of help with bathing/dressing/bathroom;Assistance with cooking/housework;Direct supervision/assist for medications management;Direct supervision/assist for financial management;Help with stairs or ramp for entrance    Equipment Recommendations None recommended by PT  Recommendations for Other Services       Functional Status Assessment Patient has had a recent decline in their functional status and demonstrates the ability to make significant improvements in function in a reasonable and predictable amount of time.     Precautions / Restrictions Precautions Precautions: Fall Restrictions Weight Bearing Restrictions: No      Mobility  Bed Mobility Overal bed mobility: Needs Assistance Bed Mobility: Rolling, Supine to Sit Rolling: Min guard   Supine to sit: Min guard, HOB elevated     General bed mobility comments: minG with heavy use of bed rails and increased time with HOB raised, would need assist from flat bed or bed without rail    Transfers Overall transfer level: Needs assistance Equipment used: Rolling walker (2 wheels) Transfers: Sit to/from Stand Sit to Stand: Min guard, Min assist           General transfer comment: minG when cued for hand placement on bed to power up, minA with standing from low toilet. pt unable to power up without dependence on BUE support    Ambulation/Gait Ambulation/Gait assistance: Min assist Gait Distance (Feet): 15 Feet (+  15 ft) Assistive device: Rolling walker (2 wheels) Gait Pattern/deviations: Step-through pattern, Decreased stride length, Trunk flexed Gait velocity: decreased Gait velocity interpretation: <1.31 ft/sec, indicative of household  ambulator   General Gait Details: small steps with max cues for directions and intermittent minA to steady.       Balance Overall balance assessment: Needs assistance Sitting-balance support: No upper extremity supported, Feet supported Sitting balance-Leahy Scale: Fair Sitting balance - Comments: pt reports he is unable to lean outside BOS to feet Postural control: Posterior lean Standing balance support: Bilateral upper extremity supported, During functional activity Standing balance-Leahy Scale: Poor Standing balance comment: pt with posterior LOB when standing at sink requiring modA to assist as pt made no movement or reactive steps to regain balance                             Pertinent Vitals/Pain Pain Assessment Pain Assessment: No/denies pain    Home Living Family/patient expects to be discharged to:: Private residence Living Arrangements: Non-relatives/Friends Available Help at Discharge: Friend(s);Available PRN/intermittently (they "don't usually" help) Type of Home: House Home Access: Level entry       Home Layout: One level Home Equipment: Rolling Walker (2 wheels);BSC/3in1;Cane - single point;Adaptive equipment      Prior Function Prior Level of Function : Independent/Modified Independent             Mobility Comments: pt reports intermittent use of cane, walks short distances in the home. takes care of his 6 dogs ADLs Comments: pt reports independence, use of sock aide to don socks, bathes 1-2x/week     Hand Dominance        Extremity/Trunk Assessment   Upper Extremity Assessment Upper Extremity Assessment: Defer to OT evaluation (significant limitations in shoulder flexion/abd bilaterally. L worse than R)    Lower Extremity Assessment Lower Extremity Assessment: Generalized weakness (scabs and redness on BLE below knee. pt able to move against gravity and grossly 4/5 to MMT. reports full sensation. unable to power up without UE or  support)    Cervical / Trunk Assessment Cervical / Trunk Assessment: Kyphotic  Communication   Communication: HOH  Cognition Arousal/Alertness: Awake/alert Behavior During Therapy: WFL for tasks assessed/performed Overall Cognitive Status: Impaired/Different from baseline Area of Impairment: Safety/judgement, Following commands, Awareness, Problem solving, Memory                     Memory: Decreased short-term memory Following Commands: Follows one step commands with increased time Safety/Judgement: Decreased awareness of safety, Decreased awareness of deficits Awareness: Intellectual Problem Solving: Slow processing, Requires verbal cues General Comments: pt with generally slowed responses, needs cues for safety and to being awareness to deficits. Pt soiled of BM upon arrival of PT/OT, unaware.        General Comments General comments (skin integrity, edema, etc.): VSS on RA        Assessment/Plan    PT Assessment Patient needs continued PT services  PT Problem List Decreased strength;Decreased range of motion;Decreased activity tolerance;Decreased balance;Decreased mobility;Decreased safety awareness;Decreased skin integrity       PT Treatment Interventions Gait training;Stair training;DME instruction;Functional mobility training;Therapeutic activities;Therapeutic exercise;Patient/family education    PT Goals (Current goals can be found in the Care Plan section)  Acute Rehab PT Goals Patient Stated Goal: to get home to his 6 dogs PT Goal Formulation: With patient Time For Goal Achievement: 10/12/21 Potential to Achieve Goals: Fair    Frequency  Min 3X/week     Co-evaluation PT/OT/SLP Co-Evaluation/Treatment: Yes Reason for Co-Treatment: To address functional/ADL transfers PT goals addressed during session: Balance;Mobility/safety with mobility;Proper use of DME;Strengthening/ROM         AM-PAC PT "6 Clicks" Mobility  Outcome Measure Help needed  turning from your back to your side while in a flat bed without using bedrails?: A Little Help needed moving from lying on your back to sitting on the side of a flat bed without using bedrails?: A Little Help needed moving to and from a bed to a chair (including a wheelchair)?: A Little Help needed standing up from a chair using your arms (e.g., wheelchair or bedside chair)?: A Little Help needed to walk in hospital room?: A Little Help needed climbing 3-5 steps with a railing? : A Lot 6 Click Score: 17    End of Session Equipment Utilized During Treatment: Gait belt Activity Tolerance: Patient tolerated treatment well Patient left: in chair;with call bell/phone within reach;with chair alarm set Nurse Communication: Mobility status PT Visit Diagnosis: Other abnormalities of gait and mobility (R26.89);Muscle weakness (generalized) (M62.81);Adult, failure to thrive (R62.7)    Time: 0932-3557 PT Time Calculation (min) (ACUTE ONLY): 37 min   Charges:   PT Evaluation $PT Eval Moderate Complexity: 1 Mod          Vickki Muff, PT, DPT   Acute Rehabilitation Department Pager #: [phone]  Ronnie Derby 09/28/2021, 9:52 AM

## 2021-09-28 NOTE — Evaluation (Signed)
Occupational Therapy Evaluation Patient Details Name: Reginald Ayers MRN: [id number] DOB: 05-20-1951 Today's Date: 09/28/2021   History of Present Illness The pt is a 71 yo male presenting 2/4 with generalized weakness, and found to have blood glucose of 30. PMH includes: COPD, CAD s/p CABG, HTN, HLD, DM II, PVD, and vertigo.   Clinical Impression   PTA pt lives independently at home with 2 roommates and 6 dogs. Pt states he has had increased weakness over the last weak and more difficulty taking care of himself. On arrival pt unaware of being incontinent of BM in bed and had dried feces under nails. At baseline pt does not use an AD, at this time requires min A with mobility @ RW level and mod A with ADL tasks. Given recent functional decline and high risk for falls, recommend rehab at SNF to maximize functional level of independence. Unsure if pt will agree to rehab at [facility]. Acute OT to follow.      Recommendations for follow up therapy are one component of a multi-disciplinary discharge planning process, led by the attending physician.  Recommendations may be updated based on patient status, additional functional criteria and insurance authorization.   Follow Up Recommendations  Skilled nursing-short term rehab (<3 hours/day)    Assistance Recommended at Discharge Frequent or constant Supervision/Assistance  Patient can return home with the following A little help with walking and/or transfers;A little help with bathing/dressing/bathroom;Assistance with cooking/housework;Assist for transportation;Help with stairs or ramp for entrance    Functional Status Assessment  Patient has had a recent decline in their functional status and demonstrates the ability to make significant improvements in function in a reasonable and predictable amount of time.  Equipment Recommendations  None recommended by OT    Recommendations for Other Services       Precautions / Restrictions  Precautions Precautions: Fall Restrictions Weight Bearing Restrictions: No      Mobility Bed Mobility Overal bed mobility: Needs Assistance Bed Mobility: Rolling, Supine to Sit Rolling: Min guard   Supine to sit: Min guard, HOB elevated     General bed mobility comments: minG with heavy use of bed rails and increased time with HOB raised, would need assist from flat bed or bed without rail    Transfers Overall transfer level: Needs assistance Equipment used: Rolling walker (2 wheels) Transfers: Sit to/from Stand Sit to Stand: Min assist           General transfer comment: minA with standing from low toilet. pt unable to power up without dependence on BUE support      Balance Overall balance assessment: Needs assistance Sitting-balance support: No upper extremity supported, Feet supported Sitting balance-Leahy Scale: Fair Sitting balance - Comments: pt reports he is unable to lean outside BOS to feet Postural control: Posterior lean Standing balance support: Bilateral upper extremity supported, During functional activity Standing balance-Leahy Scale: Poor Standing balance comment: pt with posterior LOB when standing at sink requiring modA to assist as pt made no movement or reactive steps to regain balance                           ADL either performed or assessed with clinical judgement   ADL Overall ADL's : Needs assistance/impaired     Grooming: Min guard;Standing   Upper Body Bathing: Set up;Sitting   Lower Body Bathing: Sit to/from stand;Moderate assistance   Upper Body Dressing : Set up;Sitting   Lower Body Dressing: Moderate  assistance;Sit to/from stand   Toilet Transfer: Minimal assistance;Rolling walker (2 wheels);Ambulation;Comfort height toilet   Toileting- Clothing Manipulation and Hygiene: Moderate assistance Toileting - Clothing Manipulation Details (indicate cue type and reason): incontinent of BM/unaware; difficulty cleaning  himself thouroughly; BM on hands when trying to clean;apparent feces under nails     Functional mobility during ADLs: Minimal assistance;Rolling walker (2 wheels);Cueing for safety       Vision Baseline Vision/History: 1 Wears glasses Additional Comments: reading     Perception     Praxis      Pertinent Vitals/Pain Pain Assessment Pain Assessment: Faces Faces Pain Scale: Hurts a little bit Pain Location: with shoulder ROM Pain Descriptors / Indicators: Grimacing Pain Intervention(s): Limited activity within patient's tolerance     Hand Dominance Right   Extremity/Trunk Assessment Upper Extremity Assessment Upper Extremity Assessment: RUE deficits/detail;LUE deficits/detail RUE Deficits / Details: apparent RTC insufficiency; funcitonal however unable to touch top/back of head RUE Coordination: decreased gross motor LUE Deficits / Details: similar to R however less shoulder ROM LUE Coordination: decreased gross motor   Lower Extremity Assessment Lower Extremity Assessment: Generalized weakness (hx of neuropathy per report)   Cervical / Trunk Assessment Cervical / Trunk Assessment: Kyphotic   Communication Communication Communication: HOH   Cognition Arousal/Alertness: Awake/alert Behavior During Therapy: WFL for tasks assessed/performed Overall Cognitive Status: No family/caregiver present to determine baseline cognitive functioning Area of Impairment: Safety/judgement, Awareness, Problem solving, Memory                     Memory: Decreased short-term memory Following Commands: Follows one step commands with increased time, Follows one step commands consistently Safety/Judgement: Decreased awareness of safety, Decreased awareness of deficits Awareness: Emergent Problem Solving: Slow processing, Requires verbal cues General Comments: pt with generally slowed responses, needs cues for safety and to being awareness to deficits. Pt soiled of BM upon arrival of  PT/OT, unaware.     General Comments  VSS on RA    Exercises     Shoulder Instructions      Home Living Family/patient expects to be discharged to:: Private residence Living Arrangements: Non-relatives/Friends Available Help at Discharge: Friend(s);Available PRN/intermittently (they "don't usually" help) Type of Home: House Home Access: Level entry     Home Layout: One level     Bathroom Shower/Tub: Tub/shower unit;Sponge bathes at baseline   Bathroom Toilet: Standard Bathroom Accessibility: Yes How Accessible: Accessible via walker Home Equipment: Rolling Walker (2 wheels);BSC/3in1;Cane - single point;Adaptive equipment Adaptive Equipment: Sock aid        Prior Functioning/Environment Prior Level of Function : Independent/Modified Independent             Mobility Comments: pt reports intermittent use of cane, walks short distances in the home. takes care of his 6 dogs ADLs Comments: pt reports independence, use of sock aide to don socks, bathes 1-2x/week (spongebath)        OT Problem List: Decreased strength;Decreased range of motion;Decreased activity tolerance;Impaired balance (sitting and/or standing);Decreased safety awareness;Decreased knowledge of use of DME or AE;Cardiopulmonary status limiting activity;Impaired UE functional use      OT Treatment/Interventions: Self-care/ADL training;Therapeutic exercise;Neuromuscular education;Energy conservation;DME and/or AE instruction;Therapeutic activities;Patient/family education;Balance training    OT Goals(Current goals can be found in the care plan section) Acute Rehab OT Goals Patient Stated Goal: to get stronger OT Goal Formulation: With patient Time For Goal Achievement: 10/12/21 Potential to Achieve Goals: Good  OT Frequency: Min 2X/week    Co-evaluation PT/OT/SLP Co-Evaluation/Treatment: Yes (partial  session) Reason for Co-Treatment: For patient/therapist safety PT goals addressed during session:  Balance;Mobility/safety with mobility;Proper use of DME;Strengthening/ROM OT goals addressed during session: ADL's and self-care      AM-PAC OT "6 Clicks" Daily Activity     Outcome Measure Help from another person eating meals?: None Help from another person taking care of personal grooming?: A Little Help from another person toileting, which includes using toliet, bedpan, or urinal?: A Lot Help from another person bathing (including washing, rinsing, drying)?: A Lot Help from another person to put on and taking off regular upper body clothing?: A Little Help from another person to put on and taking off regular lower body clothing?: A Lot 6 Click Score: 16   End of Session Equipment Utilized During Treatment: Gait belt;Rolling walker (2 wheels) Nurse Communication: Mobility status;Other (comment) (DC needs)  Activity Tolerance: Patient tolerated treatment well Patient left: in chair;with call bell/phone within reach;with chair alarm set  OT Visit Diagnosis: Unsteadiness on feet (R26.81);Other abnormalities of gait and mobility (R26.89);Muscle weakness (generalized) (M62.81);Other symptoms and signs involving cognitive function                Time: [id number] OT Time Calculation (min): 44 min Charges:  OT General Charges $OT Visit: 1 Visit OT Evaluation $OT Eval Moderate Complexity: 1 Mod OT Treatments $Self Care/Home Management : 8-22 mins  Maurie Boettcher, OT/L   Acute OT Clinical Specialist Acute Rehabilitation Services Pager [phone] Office [phone]   [facility] 09/28/2021, 10:23 AM

## 2021-09-28 NOTE — Plan of Care (Signed)

## 2021-09-29 DIAGNOSIS — N179 Acute kidney failure, unspecified: Secondary | ICD-10-CM | POA: Diagnosis not present

## 2021-09-29 DIAGNOSIS — E11649 Type 2 diabetes mellitus with hypoglycemia without coma: Secondary | ICD-10-CM | POA: Diagnosis not present

## 2021-09-29 DIAGNOSIS — J449 Chronic obstructive pulmonary disease, unspecified: Secondary | ICD-10-CM | POA: Diagnosis not present

## 2021-09-29 DIAGNOSIS — W57XXXA Bitten or stung by nonvenomous insect and other nonvenomous arthropods, initial encounter: Secondary | ICD-10-CM | POA: Diagnosis not present

## 2021-09-29 LAB — GLUCOSE, CAPILLARY
Glucose-Capillary: 109 mg/dL — ABNORMAL HIGH (ref 70–99)
Glucose-Capillary: 124 mg/dL — ABNORMAL HIGH (ref 70–99)
Glucose-Capillary: 158 mg/dL — ABNORMAL HIGH (ref 70–99)

## 2021-09-29 NOTE — Progress Notes (Signed)
Mobility Specialist Progress Note    09/29/21 1238  Mobility  Bed Position Chair  Activity Ambulated with assistance in hallway  Level of Assistance Contact guard assist, steadying assist  Assistive Device Front wheel walker;Cane  Distance Ambulated (ft) 200 ft (175+25)  Activity Response Tolerated well  $Mobility charge 1 Mobility   Pt received in bed and agreeable. No complaints on walk. First ambulated in hall with RW then left in chair. Returned to practice in room with cane. Left in chair with call bell in reach and chair alarm on.   [facility] Mobility Specialist  M.S. 5N: [phone]

## 2021-09-29 NOTE — TOC Initial Note (Addendum)
Transition of Care [facility]) - Initial/Assessment Note    Patient Details  Name: Reginald Ayers MRN: [id number] Date of Birth: 04-22-1951  Transition of Care [facility] CM/SW Contact:    Joanne Chars, LCSW Phone Number: 09/29/2021, 10:34 AM  Clinical Narrative:   CSW met with pt regarding PT recommendation for SNF.  Pt lives at home with 2 roommates, Gerald Stabs and Yale, along with his 6 dogs.  Pt niece Carlyle Basques is his main family contact.  Permission given to speak to them as needed.  Pt declines to go to SNF, states he needs to take care of his dogs and is not interested in "going to the nursing home." Pt would be willing to have [place] come to him.  Pt says he can put dogs outside during [facility] sessions.  Current DME in home: walker, cane, bedside commode.            CSW spoke with pt regarding reports of bed bug problems at home. Pt is aware and said he has been trying to get rid of them, unable to afford professional exterminator, has been spraying but it has not taken care of the problem.  He is aware that this needs to be addressed, also reports some financial problems that make it more difficult.    CSW LM with niece Earnestine.  2/02-1109: CSW received call from [place], discussed bed bug problem, she reports she is in [place] and on limited income, but she will see if there is anyway she can help.   Expected Discharge Plan: [place] Barriers to Discharge: Continued Medical Work up   Patient Goals and CMS Choice Patient states their goals for this hospitalization and ongoing recovery are:: "get home"      Expected Discharge Plan and Services Expected Discharge Plan: [place] In-house Referral: Clinical Social Work   Post Acute Care Choice: [place] arrangements for the past 2 months: [place]                                      Prior Living Arrangements/Services Living arrangements for the past 2 months:  [place] with:: Roommate (2 roomates: Harrell Gave and [place]) Patient language and need for interpreter reviewed:: Yes Do you feel safe going back to the place where you live?: Yes      Need for Family Participation in Patient Care: No (Comment) Care giver support system in place?: Yes (comment) Current home services: Other (comment) (none) Criminal Activity/Legal Involvement Pertinent to Current Situation/Hospitalization: No - Comment as needed  Activities of Daily Living Home Assistive Devices/Equipment: None ADL Screening (condition at time of admission) Patient's cognitive ability adequate to safely complete daily activities?: Yes Is the patient deaf or have difficulty hearing?: Yes Does the patient have difficulty seeing, even when wearing glasses/contacts?: No Does the patient have difficulty concentrating, remembering, or making decisions?: No Patient able to express need for assistance with ADLs?: Yes Does the patient have difficulty dressing or bathing?: Yes Independently performs ADLs?: No Communication: Independent Does the patient have difficulty walking or climbing stairs?: No Weakness of Legs: None Weakness of Arms/Hands: None  Permission Sought/Granted Permission sought to share information with : Family Supports Permission granted to share information with : Yes, Verbal Permission Granted  Share Information with NAME: niece Carlyle Basques, roomate Harrell Gave  Permission granted to share info w AGENCY: [facility]  Emotional Assessment Appearance:: Appears older than stated age Attitude/Demeanor/Rapport: Engaged Affect (typically observed): Appropriate, Pleasant Orientation: : Oriented to Self, Oriented to Place, Oriented to  Time, Oriented to Situation Alcohol / Substance Use: Not Applicable Psych Involvement: No (comment)  Admission diagnosis:  Hypoglycemia [E16.2] AKI (acute kidney injury) ([place]) [N17.9] Other fatigue [R53.83] Patient Active  Problem List   Diagnosis Date Noted   Type 2 diabetes mellitus with hypoglycemia ([place]) 09/27/2021   AKI (acute kidney injury) ([place]) 09/27/2021   Normocytic anemia 09/27/2021   Weakness 09/27/2021   Bedbug bite 09/27/2021   Atherosclerotic heart disease of native coronary artery without angina pectoris 08/14/2019   Diabetic retinopathy associated with type 2 diabetes mellitus ([place]) 08/14/2019   Pernicious anemia 08/14/2019   Proteinuria 08/14/2019   Pure hypercholesterolemia 08/14/2019   Stage 2 chronic kidney disease 08/14/2019   Type 2 diabetes with peripheral circulatory disorder, controlled ([place]) 08/14/2019   Venous insufficiency of both lower extremities 08/14/2019   Venous stasis dermatitis 08/14/2019   Left ear impacted cerumen 10/03/2018   Nasal crusting 10/03/2018   Right-sided sensorineural hearing loss 10/03/2018   Vertigo 10/03/2018   Sepsis ([place]) 08/12/2017   Diabetes mellitus type 2 in nonobese ([place]) 08/12/2017   CAP (community acquired pneumonia) 08/12/2017   Critical lower limb ischemia ([place]) 04/08/2016   GERD (gastroesophageal reflux disease) 07/30/2015   Chest pain 12/06/2014   Tobacco abuse 12/06/2014   Hyponatremia 12/06/2014   COPD (chronic obstructive pulmonary disease) (HCC)    Pain in the chest    Hx of CABG Feb 2010 08/21/2013   Peripheral arterial disease ([place]) 08/21/2013   Carotid artery disease ([place]) 08/21/2013   Diabetes ([place]) 08/21/2013   Essential hypertension 08/21/2013   Hyperlipidemia 08/21/2013   PCP:  Seward Carol, MD Pharmacy:   [facility] [place] [address] [place] - [address] [address] Phone: [phone] Fax: [phone]     Social Determinants of Health (SDOH) Interventions    Readmission Risk Interventions No flowsheet data found.

## 2021-09-29 NOTE — TOC Transition Note (Addendum)
Transition of Care [facility]) - CM/SW Discharge Note   Patient Details  Name: Reginald Ayers MRN: [id number] Date of Birth: [birth year]  Transition of Care [facility]) CM/SW Contact:  Bess Kinds, RN Phone Number: [phone] 09/29/2021, 2:08 PM   Clinical Narrative:     Spoke with patient at the bedside. PTA home with 2 roommates. Stated that he has lived in his home for 38 years. He has 6 dogs who bark but don't bite. He has a walker at home. He sees [employer] because he doesn't have transportation to get to [facility]. He stated that he is 70% disabled with the [employer] d/t [employer] during the [place] War. States his goal is to live to 63, then after that it doesn't matter.   Discussed HH PT. Patient is agreeable. Offered choice of agency. Referral accepted by [facility]   Patient stated that he has called for transportation home, but if that doesn't work out, then he will call a yellow cab and someone at home will  help him into the house.   No further TOC needs identified at this time.   Final next level of care: Home w Home Health Services Barriers to Discharge: No Barriers Identified   Patient Goals and CMS Choice Patient states their goals for this hospitalization and ongoing recovery are:: return home CMS Medicare.gov Compare Post Acute Care list provided to:: Patient Choice offered to / list presented to : Patient  Discharge Placement                       Discharge Plan and Services In-house Referral: Clinical Social Work   Post Acute Care Choice: Home Health          DME Arranged: N/A DME Agency: NA       HH Arranged: RN, PT, Social Work [employer] Agency: [facility] Date HH Agency Contacted: 09/29/21 Time HH Agency Contacted: [phone] Representative spoke with at [facility] Agency: Barbara Cower  Social Determinants of Health (SDOH) Interventions     Readmission Risk Interventions No flowsheet data found.

## 2021-09-29 NOTE — Plan of Care (Signed)

## 2021-09-29 NOTE — Progress Notes (Signed)
Occupational Therapy Treatment Patient Details Name: Reginald Ayers MRN: [id number] DOB: July 31, 1951 Today's Date: 09/29/2021   History of present illness The pt is a 71 yo male presenting 2/4 with generalized weakness, and found to have blood glucose of 30. PMH includes: COPD, CAD s/p CABG, HTN, HLD, DM II, PVD, and vertigo.   OT comments  Pt progressing towards goals this session, able to ambulate to bathroom for simulated toilet transfer with min guard A with use of RW. Pt min guard for ADLs this session, presents with decreased short term memory, cannot recall reason for hospital admission. Pt able to follow instructions consistently throughout session. Pt presenting with impairments listed below, will follow. Continue to recommend SNF at d/c.   Recommendations for follow up therapy are one component of a multi-disciplinary discharge planning process, led by the attending physician.  Recommendations may be updated based on patient status, additional functional criteria and insurance authorization.    Follow Up Recommendations  Skilled nursing-short term rehab (<3 hours/day)    Assistance Recommended at Discharge Frequent or constant Supervision/Assistance  Patient can return home with the following  A little help with walking and/or transfers;A little help with bathing/dressing/bathroom;Assistance with cooking/housework;Assist for transportation;Help with stairs or ramp for entrance   Equipment Recommendations  None recommended by OT    Recommendations for Other Services      Precautions / Restrictions Precautions Precautions: Fall Restrictions Weight Bearing Restrictions: No       Mobility Bed Mobility               General bed mobility comments: pt up in chair upon arrival    Transfers Overall transfer level: Needs assistance Equipment used: Rolling walker (2 wheels) Transfers: Sit to/from Stand Sit to Stand: Min guard                 Balance Overall  balance assessment: Needs assistance Sitting-balance support: No upper extremity supported, Feet supported Sitting balance-Leahy Scale: Fair     Standing balance support: Bilateral upper extremity supported, During functional activity Standing balance-Leahy Scale: Poor                             ADL either performed or assessed with clinical judgement   ADL Overall ADL's : Needs assistance/impaired                         Toilet Transfer: Min guard;Rolling walker (2 wheels) Toilet Transfer Details (indicate cue type and reason): simulatd to commode Toileting- Clothing Manipulation and Hygiene: Min guard;Sitting/lateral lean Toileting - Clothing Manipulation Details (indicate cue type and reason): able to move gown prior to sitting on commode     Functional mobility during ADLs: Min guard;Rolling walker (2 wheels)      Extremity/Trunk Assessment Upper Extremity Assessment Upper Extremity Assessment: RUE deficits/detail RUE Deficits / Details: apparent RTC insufficiency; funcitonal however unable to touch top/back of head RUE Coordination: decreased gross motor LUE Deficits / Details: similar to R however less shoulder ROM LUE Coordination: decreased gross motor   Lower Extremity Assessment Lower Extremity Assessment: Defer to PT evaluation        Vision       Perception Perception Perception: Not tested   Praxis Praxis Praxis: Not tested    Cognition Arousal/Alertness: Awake/alert Behavior During Therapy: WFL for tasks assessed/performed Overall Cognitive Status: No family/caregiver present to determine baseline cognitive functioning  Memory: Decreased short-term memory Following Commands: Follows one step commands with increased time, Follows one step commands consistently Safety/Judgement: Decreased awareness of safety, Decreased awareness of deficits   Problem Solving: Slow processing, Requires verbal  cues          Exercises      Shoulder Instructions       General Comments VSS on RA    Pertinent Vitals/ Pain       Pain Assessment Pain Assessment: No/denies pain  Home Living                                          Prior Functioning/Environment              Frequency  Min 2X/week        Progress Toward Goals  OT Goals(current goals can now be found in the care plan section)  Progress towards OT goals: Progressing toward goals  Acute Rehab OT Goals Patient Stated Goal: to go home OT Goal Formulation: With patient Time For Goal Achievement: 10/12/21 Potential to Achieve Goals: Good ADL Goals Pt Will Perform Lower Body Bathing: with modified independence;sit to/from stand;with adaptive equipment Pt Will Perform Lower Body Dressing: with modified independence;with adaptive equipment;sit to/from stand Pt Will Transfer to Toilet: with modified independence;ambulating Pt Will Perform Toileting - Clothing Manipulation and hygiene: Independently Additional ADL Goal #1: Pt will independently verbalize 3 strategies to reduce risk of falls  Plan Discharge plan remains appropriate;Frequency remains appropriate    Co-evaluation                 AM-PAC OT "6 Clicks" Daily Activity     Outcome Measure   Help from another person eating meals?: None Help from another person taking care of personal grooming?: A Little Help from another person toileting, which includes using toliet, bedpan, or urinal?: A Little Help from another person bathing (including washing, rinsing, drying)?: A Lot Help from another person to put on and taking off regular upper body clothing?: A Little Help from another person to put on and taking off regular lower body clothing?: A Lot 6 Click Score: 17    End of Session Equipment Utilized During Treatment: Gait belt;Rolling walker (2 wheels)  OT Visit Diagnosis: Unsteadiness on feet (R26.81);Other abnormalities of  gait and mobility (R26.89);Muscle weakness (generalized) (M62.81);Other symptoms and signs involving cognitive function   Activity Tolerance Patient tolerated treatment well   Patient Left in chair;with call bell/phone within reach;with chair alarm set   Nurse Communication Mobility status        Time: 1451-1506 OT Time Calculation (min): 15 min  Charges: OT General Charges $OT Visit: 1 Visit OT Treatments $Self Care/Home Management : 8-22 mins  Alfonzo Beers, OTD, OTR/L Acute Rehab [phone]) [phone]   Mayer Masker 09/29/2021, 4:52 PM

## 2021-09-29 NOTE — Progress Notes (Signed)
Discharge summary provided to pt with instructions. Pt verbalized understanding of instructions. No complaints. Pt d/c to home with [facility] services as ordered. Pt remains alert/oriented in no apparent distress.Per pt he has to call for transportation home, otherwise he will call a [employer] cab for his ride.

## 2021-09-29 NOTE — Discharge Summary (Signed)
Physician Discharge Summary  MEHTAB DOLBERRY NWG:[id number] DOB: 1951/05/25 DOA: 09/27/2021  PCP: Renford Dills, MD  Admit date: 09/27/2021 Discharge date: 09/29/2021   Discharge Diagnoses:  Principal Problem:   Type 2 diabetes mellitus with hypoglycemia [facility]) Active Problems:   Hx of CABG Feb 2010   Peripheral arterial disease (HCC)   Essential hypertension   Tobacco abuse   COPD (chronic obstructive pulmonary disease) (HCC)   AKI (acute kidney injury) (HCC)   Normocytic anemia   Weakness   Bedbug bite   Discharge Condition: Stable   Filed Weights   09/27/21 1115  Weight: 59 kg    History of present illness:  Reginald MATSUO is a 71 y.o. male with medical history significant of hypertension, hyperlipidemia, CAD s/p CABG in 2010, diabetes mellitus type 2, emphysema, and history of tobacco abuse who presents due to weakness.  Normally patient gets around with use of a cane and lives with a roommate in his home.  Yesterday reports being in his normal state of health, but this morning when he woke was unable to move or get out of bed because he felt globally weak all over.  In talking to the patient he states that his blood sugars have intermittently been low in the 40s when he will wake up at home.  Patient reports that he has been taking glipizide, metformin, and Jardiance as prescribed.  Patient reports that he intermittently has some shortness of breath on exertion, productive cough, and nausea with vomiting.  Denies having any recent fever, chest pain diarrhea symptoms.   Upon admission into the emergency department patient was seen to be afebrile, pulse 87-108, respiration 14-20, blood pressures 129/67 180/70, and O2 saturation maintained on room air.  Labs significant for hemoglobin 11.9, CO2 16, BUN 26, creatinine 1.52, and glucoseas low as 36.  Urinalysis noted glucose and trace hemoglobin without significant signs of infection.  Bedbugs were removed from patient's clothing on initial  evaluation and he was noted to have bug bites present.  He had been given 1.5 L of normal saline IV fluids and an amp of D50.  TR[facility] called to admit.  Patient was admitted.  His glipizide was held.  His hypoglycemia episodes resolved with eating.  Recommending to stop his glipizide at discharge.  PT evaluated patient recommending short-term rehab however patient is adamantly refusing this.  Patient be discharged home with home health physical therapy with as much help as we can arrange for him as an outpatient.  He is to follow-up with his primary care physician in approximately 1 week.  Hospital Course:  Type 2 diabetes mellitus with hypoglycemia (HCC)- (present on admission) Patient presented with glucose noted to be as low as 36.  Admitted to having intermittent low blood sugars in the 40s when he would wake up.  Last hemoglobin A1c was 6.7 in 2019.  Patient has been given an amp of D50 on the ED, but blood sugars were noted to drift back down to 56. -Admit to a telemetry bed  -Hypoglycemic protocols -Hold home oral medications and recommending discontinuation of glipizide due to history of hypoglycemic episodes.   Bedbug bite Patient was noted to have multiple bites upper extremities and had bedbugs found in his clothing. -Contact precautions   Weakness Patient reports being unable to get up this morning and was globally weak without focal deficit ordered.  Patient reports feeling better after given IV fluids and dextrose. -Neurochecks -PT/OT to evaluate and treat, recommending short-term rehab however  patient refused   Normocytic anemia- (present on admission) Chronic.  On admission hemoglobin 11.9 which appears higher than previous baseline hemoglobin.  Patient did not report any complaints of bleeding.   AKI (acute kidney injury) (HCC)- (present on admission) On admission creatinine elevated up to 1.52 with BUN 26.  Kidney function had been previously normal in 2019 with creatinine 0.98.   Urinalysis was positive for glucose and trace hemoglobin without significant signs of infection.  Overall improved.   COPD (chronic obstructive pulmonary disease) (HCC) Stable. -Continue Spiriva inhaler -Albuterol nebs as needed   Tobacco abuse- (present on admission) Patient reports that he only smokes a cigarette here and there at this time.   Essential hypertension- (present on admission) Blood pressures elevated to 180/70.  Home medication regimen includes metoprolol succinate 25 mg daily and losartan 50 mg daily. -Continue metoprolol -Resume losartan   Hx of CABG Feb 2010 -Continue Plavix and statin   Discharge Exam: Vitals:   09/29/21 0752 09/29/21 0818  BP: (!) 148/61   Pulse: 74 97  Resp: 18 16  Temp: 98.1 F (36.7 C)   SpO2: 97% 96%    General: Alert and oriented x4 no apparent distress Cardiovascular: Regular rate and rhythm without murmurs rubs or gallops Respiratory: Clear to auscultation bilateral no wheezes rhonchi rales  Discharge Instructions   Discharge Instructions     Diet - low sodium heart healthy   Complete by: As directed    Discharge instructions   Complete by: As directed    Follow-up with primary care physician in 1 to 2 weeks   Increase activity slowly   Complete by: As directed       Allergies as of 09/29/2021       Reactions   Actos [pioglitazone] Shortness Of Breath   Leg swelling    Lisinopril Cough        Medication List     STOP taking these medications    doxycycline 100 MG tablet Commonly known as: ADOXA   glipiZIDE 5 MG 24 hr tablet Commonly known as: GLUCOTROL XL       TAKE these medications    albuterol 108 (90 Base) MCG/ACT inhaler Commonly known as: VENTOLIN HFA Inhale 2 puffs into the lungs every 4 (four) hours as needed.   atorvastatin 40 MG tablet Commonly known as: LIPITOR Take 1 tablet (40 mg total) by mouth daily.   B COMPLEX PO Take 1 tablet by mouth 2 (two) times a week.   clopidogrel  75 MG tablet Commonly known as: PLAVIX Take 75 mg by mouth daily.   empagliflozin 25 MG Tabs tablet Commonly known as: JARDIANCE Take 25 mg by mouth daily.   furosemide 40 MG tablet Commonly known as: LASIX Take 40 mg by mouth daily as needed for edema.   ibuprofen 200 MG tablet Commonly known as: ADVIL Take 800 mg by mouth every 8 (eight) hours as needed for moderate pain.   IRON PO Take 1 tablet by mouth 4 (four) times a week.   LEG CRAMP RELIEF SL Place 2 tablets under the tongue at bedtime as needed (leg cramps).   losartan 50 MG tablet Commonly known as: COZAAR Take 50 mg by mouth daily.   metFORMIN 500 MG tablet Commonly known as: GLUCOPHAGE Take 1,000 mg by mouth 2 (two) times daily with a meal.   metoprolol succinate 25 MG 24 hr tablet Commonly known as: TOPROL-XL TAKE 1 TABLET(25 MG) BY MOUTH DAILY What changed: See the new instructions.  MURINE TEARS FOR DRY EYES OP Apply 1 drop to eye 3 (three) times daily as needed (dry eyes).   Nitrostat 0.4 MG SL tablet Generic drug: nitroGLYCERIN Take 0.4 mg by mouth every 5 (five) minutes as needed for chest pain.   NYQUIL PO Take 1 Dose by mouth at bedtime.   potassium chloride 10 MEQ tablet Commonly known as: KLOR-CON Take 10 mEq by mouth daily.   simvastatin 40 MG tablet Commonly known as: ZOCOR Take 40 mg by mouth daily.   Spiriva HandiHaler 18 MCG inhalation capsule Generic drug: tiotropium Place 18 mcg into inhaler and inhale daily at 8 pm.   traMADol 50 MG tablet Commonly known as: ULTRAM Take 50 mg by mouth 3 (three) times daily as needed for severe pain.   VITAMIN B 12 PO Take 1 capsule by mouth daily.       Allergies  Allergen Reactions   Actos [Pioglitazone] Shortness Of Breath    Leg swelling    Lisinopril Cough    Follow-up Information     [facility] Follow up.   Why: the office will call to schedule home health visits Contact information: [address] [place], [place] [address] PHONE NUMBER [phone]                 The results of significant diagnostics from this hospitalization (including imaging, microbiology, ancillary and laboratory) are listed below for reference.    Significant Diagnostic Studies: DG Chest Portable 1 View  Result Date: 09/27/2021 CLINICAL DATA:  Weakness and dizziness. EXAM: PORTABLE CHEST 1 VIEW COMPARISON:  07/10/2021 FINDINGS: Bilateral emphysematous changes. No focal consolidation. Chronic bilateral interstitial thickening. No pleural effusion or pneumothorax. Heart and mediastinal contours are unremarkable. Prior CABG. No acute osseous abnormality. IMPRESSION: 1. No acute cardiopulmonary disease. 2. COPD. Electronically Signed   By: Elige Ko M.D.   On: 09/27/2021 12:07    Microbiology: Recent Results (from the past 240 hour(s))  Urine Culture     Status: None   Collection Time: 09/27/21  1:55 PM   Specimen: Urine, Clean Catch  Result Value Ref Range Status   Specimen Description URINE, CLEAN CATCH  Final   Special Requests NONE  Final   Culture   Final    NO GROWTH Performed at [facility], [address], [place], [place] [address]    Report Status 09/28/2021 FINAL  Final     Labs: Basic Metabolic Panel: Recent Labs  Lab 09/27/21 1108 09/28/21 0230  NA 139 137  K 3.9 3.5  CL 112* 111  CO2 16* 16*  GLUCOSE 56* 96  BUN 26* 18  CREATININE 1.52* 1.42*  CALCIUM 9.0 8.2*   Liver Function Tests: Recent Labs  Lab 09/27/21 1149  AST 22  ALT 17  ALKPHOS 46  BILITOT 1.1  PROT 5.5*  ALBUMIN 3.4*   No results for input(s): LIPASE, AMYLASE in the last 168 hours. Recent Labs  Lab 09/27/21 1108  AMMONIA 17   CBC: Recent Labs  Lab 09/27/21 1108 09/28/21 0230  WBC 6.2 6.0  HGB 11.9* 11.9*  HCT 37.5* 36.0*  MCV 94.5 92.1  PLT 200 166   Cardiac Enzymes: No results for input(s): CKTOTAL, CKMB, CKMBINDEX, TROPONINI in the last 168 hours. BNP: BNP  (last 3 results) No results for input(s): BNP in the last 8760 hours.  ProBNP (last 3 results) No results for input(s): PROBNP in the last 8760 hours.  CBG: Recent Labs  Lab 09/28/21 1119 09/28/21 1629 09/29/21 0037 09/29/21 0749 09/29/21 1125  GLUCAP 121* 132* 109* 124* 158*       Signed:  Griff Badley A MD.  [facility] 09/29/2021, 4:23 PM

## 2021-09-30 LAB — GLUCOSE, CAPILLARY: Glucose-Capillary: 38 mg/dL — CL (ref 70–99)

## 2021-10-13 DIAGNOSIS — Z7984 Long term (current) use of oral hypoglycemic drugs: Secondary | ICD-10-CM | POA: Diagnosis not present

## 2021-10-13 DIAGNOSIS — R7989 Other specified abnormal findings of blood chemistry: Secondary | ICD-10-CM | POA: Diagnosis not present

## 2021-10-13 DIAGNOSIS — E162 Hypoglycemia, unspecified: Secondary | ICD-10-CM | POA: Diagnosis not present

## 2021-10-13 DIAGNOSIS — E1151 Type 2 diabetes mellitus with diabetic peripheral angiopathy without gangrene: Secondary | ICD-10-CM | POA: Diagnosis not present

## 2021-10-13 DIAGNOSIS — N1831 Chronic kidney disease, stage 3a: Secondary | ICD-10-CM | POA: Diagnosis not present

## 2021-10-13 DIAGNOSIS — I10 Essential (primary) hypertension: Secondary | ICD-10-CM | POA: Diagnosis not present

## 2021-10-24 DIAGNOSIS — R7989 Other specified abnormal findings of blood chemistry: Secondary | ICD-10-CM | POA: Diagnosis not present

## 2021-10-31 DIAGNOSIS — E1059 Type 1 diabetes mellitus with other circulatory complications: Secondary | ICD-10-CM | POA: Diagnosis not present

## 2021-10-31 DIAGNOSIS — R7989 Other specified abnormal findings of blood chemistry: Secondary | ICD-10-CM | POA: Diagnosis not present

## 2021-11-03 ENCOUNTER — Other Ambulatory Visit: Payer: Self-pay | Admitting: *Deleted

## 2021-11-03 DIAGNOSIS — I779 Disorder of arteries and arterioles, unspecified: Secondary | ICD-10-CM

## 2021-11-05 DIAGNOSIS — Z20822 Contact with and (suspected) exposure to covid-19: Secondary | ICD-10-CM | POA: Diagnosis not present

## 2021-11-06 DIAGNOSIS — E1059 Type 1 diabetes mellitus with other circulatory complications: Secondary | ICD-10-CM | POA: Diagnosis not present

## 2021-11-17 ENCOUNTER — Ambulatory Visit: Payer: Medicare Other | Admitting: Surgery

## 2021-11-17 ENCOUNTER — Encounter (HOSPITAL_COMMUNITY): Payer: Medicare Other

## 2021-11-20 DIAGNOSIS — Z20822 Contact with and (suspected) exposure to covid-19: Secondary | ICD-10-CM | POA: Diagnosis not present

## 2021-12-08 DIAGNOSIS — Z20828 Contact with and (suspected) exposure to other viral communicable diseases: Secondary | ICD-10-CM | POA: Diagnosis not present

## 2021-12-15 ENCOUNTER — Ambulatory Visit (INDEPENDENT_AMBULATORY_CARE_PROVIDER_SITE_OTHER): Payer: Medicare Other | Admitting: Surgery

## 2021-12-15 ENCOUNTER — Ambulatory Visit (HOSPITAL_COMMUNITY)
Admission: RE | Admit: 2021-12-15 | Discharge: 2021-12-15 | Disposition: A | Discharge status: Home / Self Care | Payer: Medicare Other | Source: Ambulatory Visit | Attending: Surgery | Admitting: Surgery

## 2021-12-15 ENCOUNTER — Encounter: Payer: Self-pay | Admitting: Surgery

## 2021-12-15 VITALS — BP 161/74 | HR 88 | Temp 98.0°F | Resp 20 | Ht 61.0 in | Wt 107.0 lb

## 2021-12-15 DIAGNOSIS — I70213 Atherosclerosis of native arteries of extremities with intermittent claudication, bilateral legs: Secondary | ICD-10-CM

## 2021-12-15 DIAGNOSIS — I779 Disorder of arteries and arterioles, unspecified: Secondary | ICD-10-CM

## 2021-12-15 NOTE — Progress Notes (Signed)
? ?[facility] ? ?Patient name: Reginald Ayers MRN: [id number] DOB: 1950-09-11 Sex: male ? ? ?REQUESTING PROVIDER:  ? ? Dr. Delfina Redwood ? ? ?REASON FOR CONSULT:  ?  ?claudication ? ?HISTORY OF PRESENT ILLNESS:  ? ?Reginald Ayers is a 71 y.o. male, who is referred back for evaluation of claudication.  He has undergone the following procedures: ? ?09/22/2016:Viabahn stenting of an occluded right superficial femoral artery ? ?12/08/2016 atherectomy with angioplasty, right posterior tibial artery ? ?04/20/2017: Drug-coated balloon angioplasty, right superficial femoral artery, balloon angioplasty, right common femoral artery ? ?09/07/2017: Diagnostic angiogram showing patent right superficial femoral artery stenting and flush occlusion of the left superficial femoral artery ? ? ?Patient has a history of coronary artery disease, status post CABG.  He is a diabetic.  He is medically managed for hypertension.  He is on a statin for hypercholesterolemia. ? ?He was recently in the hospital for hypoglycemia.  His glipizide was stopped.  He does have some mild renal insufficiency.  He walks around in a 700 square foot house and does not get any significant leg symptoms.  He does have ulcers on his right leg which have nearly healed. ?  He is a current smokerPAST MEDICAL HISTORY  ? ? ?Past Medical History:  ?Diagnosis Date  ? Carotid artery disease ([place])   ? L-ICA 100%, mod R-ICA dz  ? COPD (chronic obstructive pulmonary disease) ([place])   ? Coronary artery disease   ? s/p CABG February 2010 by Dr. Merilynn Finland  ? Diabetes ([place])   ? Emphysema lung ([place])   ? Hyperlipidemia   ? Hypertension   ? Peripheral arterial disease ([place])   ? post left common iliac and right SFA stenting remotely  ? Tobacco abuse   ?    ? ? ? ?FAMILY HISTORY  ? ?Family History  ?Problem Relation Age of Onset  ? Heart attack Mother   ? CVA Mother   ? Heart disease Mother   ? Diabetes Sister   ?  Hypertension Sister   ? ? ?SOCIAL HISTORY:  ? ?Social History  ? ?Socioeconomic History  ? Marital status: Single  ?  Spouse name: Not on file  ? Number of children: Not on file  ? Years of education: Not on file  ? Highest education level: Not on file  ?Occupational History  ? Occupation: Retired Development worker, community  ?Tobacco Use  ? Smoking status: Light Smoker  ?  Packs/day: 0.00  ?  Types: Cigarettes  ?  Last attempt to quit: 08/07/2016  ?  Years since quitting: 5.3  ? Smokeless tobacco: Current  ?  Types: Chew  ?Vaping Use  ? Vaping Use: Never used  ?Substance and Sexual Activity  ? Alcohol use: No  ?  Alcohol/week: 0.0 standard drinks  ?  Comment: Weekend drinker, quit 2000. NIGHTLY nyquill  ? Drug use: No  ? Sexual activity: Not on file  ?Other Topics Concern  ? Not on file  ?Social History Narrative  ? Lives in [place]. Has 2 roommates, they both smoke. Has 9 dogs.  ? ?Social Determinants of Health  ? ?Financial Resource Strain: Not on file  ?Food Insecurity: Not on file  ?Transportation Needs: Not on file  ?Physical Activity: Not on file  ?Stress: Not on file  ?Social Connections: Not on file  ?Intimate Partner Violence: Not on file  ? ? ?ALLERGIES:  ? ? ?Allergies  ?Allergen Reactions  ? Actos [Pioglitazone] Shortness Of Breath  ?  Leg swelling   ? Atorvastatin   ?  Other reaction(s): leg pain  ? Lisinopril Cough  ? ? ?CURRENT MEDICATIONS:  ? ? ?Current Outpatient Medications  ?Medication Sig Dispense Refill  ? albuterol (VENTOLIN HFA) 108 (90 Base) MCG/ACT inhaler Inhale 2 puffs into the lungs every 4 (four) hours as needed.    ? clopidogrel (PLAVIX) 75 MG tablet Take 75 mg by mouth daily.     ? Cyanocobalamin (VITAMIN B 12 PO) Take 1 capsule by mouth daily.    ? empagliflozin (JARDIANCE) 25 MG TABS tablet Take 25 mg by mouth daily.    ? furosemide (LASIX) 40 MG tablet Take 40 mg by mouth daily as needed for edema.    ? ibuprofen (ADVIL,MOTRIN) 200 MG tablet Take 800 mg by mouth every 8 (eight) hours as needed for  moderate pain.    ? IRON PO Take 1 tablet by mouth 4 (four) times a week.     ? losartan (COZAAR) 50 MG tablet Take 50 mg by mouth daily.    ? metFORMIN (GLUCOPHAGE) 500 MG tablet Take 1,000 mg by mouth 2 (two) times daily with a meal.     ? metoprolol succinate (TOPROL-XL) 25 MG 24 hr tablet TAKE 1 TABLET(25 MG) BY MOUTH DAILY (Patient taking differently: Take 25 mg by mouth daily.) 30 tablet 10  ? NITROSTAT 0.4 MG SL tablet Take 0.4 mg by mouth every 5 (five) minutes as needed for chest pain.   0  ? Polyvinyl Alcohol-Povidone (MURINE TEARS FOR DRY EYES OP) Apply 1 drop to eye 3 (three) times daily as needed (dry eyes).    ? potassium chloride (K-DUR) 10 MEQ tablet Take 10 mEq by mouth daily.    ? Pseudoeph-Doxylamine-DM-APAP (NYQUIL PO) Take 1 Dose by mouth at bedtime.     ? simvastatin (ZOCOR) 40 MG tablet Take 40 mg by mouth daily.    ? SPIRIVA HANDIHALER 18 MCG inhalation capsule Place 18 mcg into inhaler and inhale daily at 8 pm.     ? traMADol (ULTRAM) 50 MG tablet Take 50 mg by mouth 3 (three) times daily as needed for severe pain.     ? atorvastatin (LIPITOR) 40 MG tablet Take 1 tablet (40 mg total) by mouth daily. 90 tablet 3  ? B Complex Vitamins (B COMPLEX PO) Take 1 tablet by mouth 2 (two) times a week.  (Patient not taking: Reported on 09/28/2021)    ? Homeopathic Products (LEG CRAMP RELIEF SL) Place 2 tablets under the tongue at bedtime as needed (leg cramps).  (Patient not taking: Reported on 09/28/2021)    ? ?No current facility-administered medications for this visit.  ? ? ?REVIEW OF SYSTEMS:  ? ?[X]  denotes positive finding, [ ]  denotes negative finding ?Cardiac  Comments:  ?Chest pain or chest pressure:    ?Shortness of breath upon exertion:    ?Short of breath when lying flat:    ?Irregular heart rhythm:    ?    ?Vascular    ?Pain in calf, thigh, or hip brought on by ambulation:    ?Pain in feet at night that wakes you up from your sleep:     ?Blood clot in your veins:    ?Leg swelling:     ?     ?Pulmonary    ?Oxygen at home:    ?Productive cough:     ?Wheezing:     ?    ?Neurologic    ?Sudden weakness in arms or legs:     ?  Sudden numbness in arms or legs:     ?Sudden onset of difficulty speaking or slurred speech:    ?Temporary loss of vision in one eye:     ?Problems with dizziness:     ?    ?Gastrointestinal    ?Blood in stool:     ? ?Vomited blood:     ?    ?Genitourinary    ?Burning when urinating:     ?Blood in urine:    ?    ?Psychiatric    ?Major depression:     ?    ?Hematologic    ?Bleeding problems:    ?Problems with blood clotting too easily:    ?    ?Skin    ?Rashes or ulcers:    ?    ?Constitutional    ?Fever or chills:    ? ?PHYSICAL EXAM:  ? ?Vitals:  ? 12/15/21 1448  ?BP: (!) 161/74  ?Pulse: 88  ?Resp: 20  ?Temp: 98 ?F (36.7 ?C)  ?SpO2: 98%  ?Weight: 107 lb (48.5 kg)  ?Height: 5\' 1"  (1.549 m)  ? ? ?GENERAL: The patient is a well-nourished male, in no acute distress. The vital signs are documented above. ?CARDIAC: There is a regular rate and rhythm.  ?VASCULAR: Nonpalpable pedal pulses ?PULMONARY: Nonlabored respirations ?MUSCULOSKELETAL: There are no major deformities or cyanosis. ?NEUROLOGIC: No focal weakness or paresthesias are detected. ?SKIN: See photo below. ?PSYCHIATRIC: The patient has a normal affect. ? ? ?STUDIES:  ? ?I have reviewed the following studies: ?ABI/TBIToday's ABIToday's TBIPrevious ABIPrevious TBI  ?+-------+-----------+-----------+------------+------------+  ?Right  0.81       0.44       0.61        0.46          ?+-------+-----------+-----------+------------+------------+  ?Left   0.42       0.33       0.46        0.31          ?+-------+-----------+-----------+------------+------------+ ?Right toe pressure equals 64 ?Left toe pressure equals 48 ? ?ASSESSMENT and PLAN  ? ?PAD: Patient has a history of right superficial femoral artery recanalization and stenting.  He is undergone multiple interventions for secondary patency.  I have not seen him  for several years because of the pandemic.  He now is back for further evaluation of his vascular disease.  He has some wounds on his right leg which are nearly healed.  I am concerned that he may have had a recurrent stenosis with th

## 2021-12-19 ENCOUNTER — Telehealth: Payer: Self-pay

## 2021-12-19 NOTE — Telephone Encounter (Signed)
Contacted patient to schedule for aortogram with runoff and right leg stent. Patient declined to schedule at this time. He reports since 3 weeks ago, he's been feeling weak, difficulty walking, coughing up yellow phlegm, and occasional dizziness. Noted audible crackles and breathing exertion while talking. Patient denies any fever but reports forehead has been hot, with temperature around 98.7 when he checks it.  Encouraged Pt to contact PCP for evaluation and to seek emergency treatment if symptoms worsen. Patient voiced understanding. Patient stated he will contact to schedule when feeling better.  ?

## 2021-12-20 ENCOUNTER — Other Ambulatory Visit: Payer: Self-pay

## 2021-12-20 ENCOUNTER — Emergency Department (HOSPITAL_COMMUNITY): Payer: Medicare Other

## 2021-12-20 ENCOUNTER — Inpatient Hospital Stay (HOSPITAL_COMMUNITY)
Admission: EM | Admit: 2021-12-20 | Discharge: 2021-12-31 | DRG: 853 | Disposition: A | Discharge status: Home Health | Payer: Medicare Other | Attending: Internal Medicine | Admitting: Internal Medicine

## 2021-12-20 ENCOUNTER — Encounter (HOSPITAL_COMMUNITY): Payer: Self-pay | Admitting: *Deleted

## 2021-12-20 ENCOUNTER — Inpatient Hospital Stay (HOSPITAL_COMMUNITY): Payer: Medicare Other

## 2021-12-20 DIAGNOSIS — J181 Lobar pneumonia, unspecified organism: Secondary | ICD-10-CM | POA: Diagnosis not present

## 2021-12-20 DIAGNOSIS — R6521 Severe sepsis with septic shock: Secondary | ICD-10-CM | POA: Diagnosis present

## 2021-12-20 DIAGNOSIS — M542 Cervicalgia: Secondary | ICD-10-CM | POA: Diagnosis not present

## 2021-12-20 DIAGNOSIS — I5021 Acute systolic (congestive) heart failure: Secondary | ICD-10-CM | POA: Diagnosis present

## 2021-12-20 DIAGNOSIS — G8194 Hemiplegia, unspecified affecting left nondominant side: Secondary | ICD-10-CM | POA: Diagnosis not present

## 2021-12-20 DIAGNOSIS — I639 Cerebral infarction, unspecified: Secondary | ICD-10-CM | POA: Diagnosis not present

## 2021-12-20 DIAGNOSIS — Z79899 Other long term (current) drug therapy: Secondary | ICD-10-CM

## 2021-12-20 DIAGNOSIS — D631 Anemia in chronic kidney disease: Secondary | ICD-10-CM | POA: Diagnosis present

## 2021-12-20 DIAGNOSIS — N179 Acute kidney failure, unspecified: Secondary | ICD-10-CM | POA: Diagnosis present

## 2021-12-20 DIAGNOSIS — E1151 Type 2 diabetes mellitus with diabetic peripheral angiopathy without gangrene: Secondary | ICD-10-CM | POA: Diagnosis present

## 2021-12-20 DIAGNOSIS — I672 Cerebral atherosclerosis: Secondary | ICD-10-CM | POA: Diagnosis not present

## 2021-12-20 DIAGNOSIS — E876 Hypokalemia: Secondary | ICD-10-CM | POA: Diagnosis present

## 2021-12-20 DIAGNOSIS — R29810 Facial weakness: Secondary | ICD-10-CM | POA: Diagnosis not present

## 2021-12-20 DIAGNOSIS — Z20822 Contact with and (suspected) exposure to covid-19: Secondary | ICD-10-CM | POA: Diagnosis present

## 2021-12-20 DIAGNOSIS — I6521 Occlusion and stenosis of right carotid artery: Secondary | ICD-10-CM | POA: Diagnosis not present

## 2021-12-20 DIAGNOSIS — I700 Atherosclerosis of aorta: Secondary | ICD-10-CM | POA: Diagnosis not present

## 2021-12-20 DIAGNOSIS — I10 Essential (primary) hypertension: Secondary | ICD-10-CM | POA: Diagnosis present

## 2021-12-20 DIAGNOSIS — Z951 Presence of aortocoronary bypass graft: Secondary | ICD-10-CM

## 2021-12-20 DIAGNOSIS — G9341 Metabolic encephalopathy: Secondary | ICD-10-CM | POA: Diagnosis present

## 2021-12-20 DIAGNOSIS — J439 Emphysema, unspecified: Secondary | ICD-10-CM | POA: Diagnosis not present

## 2021-12-20 DIAGNOSIS — G9349 Other encephalopathy: Secondary | ICD-10-CM | POA: Diagnosis not present

## 2021-12-20 DIAGNOSIS — I130 Hypertensive heart and chronic kidney disease with heart failure and stage 1 through stage 4 chronic kidney disease, or unspecified chronic kidney disease: Secondary | ICD-10-CM | POA: Diagnosis present

## 2021-12-20 DIAGNOSIS — R0902 Hypoxemia: Secondary | ICD-10-CM | POA: Diagnosis not present

## 2021-12-20 DIAGNOSIS — R0602 Shortness of breath: Secondary | ICD-10-CM | POA: Diagnosis not present

## 2021-12-20 DIAGNOSIS — E1122 Type 2 diabetes mellitus with diabetic chronic kidney disease: Secondary | ICD-10-CM | POA: Diagnosis present

## 2021-12-20 DIAGNOSIS — L89321 Pressure ulcer of left buttock, stage 1: Secondary | ICD-10-CM | POA: Diagnosis present

## 2021-12-20 DIAGNOSIS — J438 Other emphysema: Secondary | ICD-10-CM | POA: Diagnosis present

## 2021-12-20 DIAGNOSIS — E119 Type 2 diabetes mellitus without complications: Secondary | ICD-10-CM

## 2021-12-20 DIAGNOSIS — E43 Unspecified severe protein-calorie malnutrition: Secondary | ICD-10-CM | POA: Diagnosis present

## 2021-12-20 DIAGNOSIS — Z888 Allergy status to other drugs, medicaments and biological substances status: Secondary | ICD-10-CM

## 2021-12-20 DIAGNOSIS — R64 Cachexia: Secondary | ICD-10-CM | POA: Diagnosis present

## 2021-12-20 DIAGNOSIS — K5900 Constipation, unspecified: Secondary | ICD-10-CM | POA: Diagnosis not present

## 2021-12-20 DIAGNOSIS — A419 Sepsis, unspecified organism: Principal | ICD-10-CM | POA: Diagnosis present

## 2021-12-20 DIAGNOSIS — J984 Other disorders of lung: Secondary | ICD-10-CM | POA: Diagnosis not present

## 2021-12-20 DIAGNOSIS — E872 Acidosis, unspecified: Secondary | ICD-10-CM | POA: Diagnosis not present

## 2021-12-20 DIAGNOSIS — N1831 Chronic kidney disease, stage 3a: Secondary | ICD-10-CM | POA: Diagnosis not present

## 2021-12-20 DIAGNOSIS — R918 Other nonspecific abnormal finding of lung field: Secondary | ICD-10-CM | POA: Diagnosis not present

## 2021-12-20 DIAGNOSIS — R531 Weakness: Secondary | ICD-10-CM

## 2021-12-20 DIAGNOSIS — R54 Age-related physical debility: Secondary | ICD-10-CM | POA: Diagnosis present

## 2021-12-20 DIAGNOSIS — I6522 Occlusion and stenosis of left carotid artery: Secondary | ICD-10-CM | POA: Diagnosis not present

## 2021-12-20 DIAGNOSIS — J432 Centrilobular emphysema: Secondary | ICD-10-CM | POA: Diagnosis not present

## 2021-12-20 DIAGNOSIS — I63132 Cerebral infarction due to embolism of left carotid artery: Secondary | ICD-10-CM

## 2021-12-20 DIAGNOSIS — I480 Paroxysmal atrial fibrillation: Secondary | ICD-10-CM | POA: Diagnosis present

## 2021-12-20 DIAGNOSIS — G460 Middle cerebral artery syndrome: Secondary | ICD-10-CM | POA: Diagnosis present

## 2021-12-20 DIAGNOSIS — J9809 Other diseases of bronchus, not elsewhere classified: Secondary | ICD-10-CM | POA: Diagnosis not present

## 2021-12-20 DIAGNOSIS — E1165 Type 2 diabetes mellitus with hyperglycemia: Secondary | ICD-10-CM | POA: Diagnosis present

## 2021-12-20 DIAGNOSIS — Z681 Body mass index (BMI) 19 or less, adult: Secondary | ICD-10-CM

## 2021-12-20 DIAGNOSIS — Z720 Tobacco use: Secondary | ICD-10-CM

## 2021-12-20 DIAGNOSIS — I739 Peripheral vascular disease, unspecified: Secondary | ICD-10-CM | POA: Diagnosis not present

## 2021-12-20 DIAGNOSIS — I4891 Unspecified atrial fibrillation: Secondary | ICD-10-CM | POA: Diagnosis not present

## 2021-12-20 DIAGNOSIS — Z9889 Other specified postprocedural states: Secondary | ICD-10-CM | POA: Diagnosis not present

## 2021-12-20 DIAGNOSIS — M25519 Pain in unspecified shoulder: Secondary | ICD-10-CM | POA: Diagnosis not present

## 2021-12-20 DIAGNOSIS — E785 Hyperlipidemia, unspecified: Secondary | ICD-10-CM | POA: Diagnosis present

## 2021-12-20 DIAGNOSIS — Z8673 Personal history of transient ischemic attack (TIA), and cerebral infarction without residual deficits: Secondary | ICD-10-CM | POA: Diagnosis not present

## 2021-12-20 DIAGNOSIS — I248 Other forms of acute ischemic heart disease: Secondary | ICD-10-CM | POA: Diagnosis not present

## 2021-12-20 DIAGNOSIS — R1310 Dysphagia, unspecified: Secondary | ICD-10-CM | POA: Diagnosis not present

## 2021-12-20 DIAGNOSIS — E870 Hyperosmolality and hypernatremia: Secondary | ICD-10-CM | POA: Diagnosis present

## 2021-12-20 DIAGNOSIS — Z833 Family history of diabetes mellitus: Secondary | ICD-10-CM

## 2021-12-20 DIAGNOSIS — R9431 Abnormal electrocardiogram [ECG] [EKG]: Secondary | ICD-10-CM | POA: Diagnosis not present

## 2021-12-20 DIAGNOSIS — R739 Hyperglycemia, unspecified: Secondary | ICD-10-CM | POA: Diagnosis not present

## 2021-12-20 DIAGNOSIS — J329 Chronic sinusitis, unspecified: Secondary | ICD-10-CM | POA: Diagnosis not present

## 2021-12-20 DIAGNOSIS — I6523 Occlusion and stenosis of bilateral carotid arteries: Secondary | ICD-10-CM | POA: Diagnosis not present

## 2021-12-20 DIAGNOSIS — Z7984 Long term (current) use of oral hypoglycemic drugs: Secondary | ICD-10-CM

## 2021-12-20 DIAGNOSIS — Z7902 Long term (current) use of antithrombotics/antiplatelets: Secondary | ICD-10-CM

## 2021-12-20 DIAGNOSIS — Z8249 Family history of ischemic heart disease and other diseases of the circulatory system: Secondary | ICD-10-CM

## 2021-12-20 DIAGNOSIS — J189 Pneumonia, unspecified organism: Secondary | ICD-10-CM | POA: Diagnosis present

## 2021-12-20 DIAGNOSIS — I2510 Atherosclerotic heart disease of native coronary artery without angina pectoris: Secondary | ICD-10-CM | POA: Diagnosis present

## 2021-12-20 DIAGNOSIS — I63512 Cerebral infarction due to unspecified occlusion or stenosis of left middle cerebral artery: Secondary | ICD-10-CM | POA: Diagnosis present

## 2021-12-20 DIAGNOSIS — L8990 Pressure ulcer of unspecified site, unspecified stage: Secondary | ICD-10-CM | POA: Insufficient documentation

## 2021-12-20 DIAGNOSIS — I63233 Cerebral infarction due to unspecified occlusion or stenosis of bilateral carotid arteries: Secondary | ICD-10-CM | POA: Diagnosis not present

## 2021-12-20 DIAGNOSIS — Z823 Family history of stroke: Secondary | ICD-10-CM

## 2021-12-20 DIAGNOSIS — J449 Chronic obstructive pulmonary disease, unspecified: Secondary | ICD-10-CM | POA: Diagnosis present

## 2021-12-20 DIAGNOSIS — J9601 Acute respiratory failure with hypoxia: Principal | ICD-10-CM | POA: Diagnosis present

## 2021-12-20 DIAGNOSIS — Z01810 Encounter for preprocedural cardiovascular examination: Secondary | ICD-10-CM | POA: Diagnosis not present

## 2021-12-20 DIAGNOSIS — R29719 NIHSS score 19: Secondary | ICD-10-CM | POA: Diagnosis present

## 2021-12-20 DIAGNOSIS — R4182 Altered mental status, unspecified: Secondary | ICD-10-CM | POA: Diagnosis not present

## 2021-12-20 DIAGNOSIS — I6340 Cerebral infarction due to embolism of unspecified cerebral artery: Secondary | ICD-10-CM | POA: Insufficient documentation

## 2021-12-20 DIAGNOSIS — Z4682 Encounter for fitting and adjustment of non-vascular catheter: Secondary | ICD-10-CM | POA: Diagnosis not present

## 2021-12-20 DIAGNOSIS — L89311 Pressure ulcer of right buttock, stage 1: Secondary | ICD-10-CM | POA: Diagnosis present

## 2021-12-20 DIAGNOSIS — I63231 Cerebral infarction due to unspecified occlusion or stenosis of right carotid arteries: Secondary | ICD-10-CM | POA: Diagnosis not present

## 2021-12-20 DIAGNOSIS — Z7951 Long term (current) use of inhaled steroids: Secondary | ICD-10-CM

## 2021-12-20 DIAGNOSIS — N200 Calculus of kidney: Secondary | ICD-10-CM | POA: Diagnosis not present

## 2021-12-20 DIAGNOSIS — M47812 Spondylosis without myelopathy or radiculopathy, cervical region: Secondary | ICD-10-CM | POA: Diagnosis not present

## 2021-12-20 DIAGNOSIS — E1169 Type 2 diabetes mellitus with other specified complication: Secondary | ICD-10-CM | POA: Diagnosis not present

## 2021-12-20 DIAGNOSIS — K08109 Complete loss of teeth, unspecified cause, unspecified class: Secondary | ICD-10-CM | POA: Diagnosis present

## 2021-12-20 LAB — CBC WITH DIFFERENTIAL/PLATELET
Abs Immature Granulocytes: 0.23 10*3/uL — ABNORMAL HIGH (ref 0.00–0.07)
Basophils Absolute: 0.1 10*3/uL (ref 0.0–0.1)
Basophils Relative: 0 %
Eosinophils Absolute: 0 10*3/uL (ref 0.0–0.5)
Eosinophils Relative: 0 %
HCT: 33.5 % — ABNORMAL LOW (ref 39.0–52.0)
Hemoglobin: 10.4 g/dL — ABNORMAL LOW (ref 13.0–17.0)
Immature Granulocytes: 1 %
Lymphocytes Relative: 4 %
Lymphs Abs: 0.7 10*3/uL (ref 0.7–4.0)
MCH: 29.7 pg (ref 26.0–34.0)
MCHC: 31 g/dL (ref 30.0–36.0)
MCV: 95.7 fL (ref 80.0–100.0)
Monocytes Absolute: 1.3 10*3/uL — ABNORMAL HIGH (ref 0.1–1.0)
Monocytes Relative: 7 %
Neutro Abs: 16.6 10*3/uL — ABNORMAL HIGH (ref 1.7–7.7)
Neutrophils Relative %: 88 %
Platelets: 284 10*3/uL (ref 150–400)
RBC: 3.5 MIL/uL — ABNORMAL LOW (ref 4.22–5.81)
RDW: 14.7 % (ref 11.5–15.5)
Smear Review: ADEQUATE
WBC Morphology: INCREASED
WBC: 18.8 10*3/uL — ABNORMAL HIGH (ref 4.0–10.5)
nRBC: 0 % (ref 0.0–0.2)

## 2021-12-20 LAB — I-STAT ARTERIAL BLOOD GAS, ED
Acid-base deficit: 19 mmol/L — ABNORMAL HIGH (ref 0.0–2.0)
Bicarbonate: 10.8 mmol/L — ABNORMAL LOW (ref 20.0–28.0)
Calcium, Ion: 1.3 mmol/L (ref 1.15–1.40)
HCT: 32 % — ABNORMAL LOW (ref 39.0–52.0)
Hemoglobin: 10.9 g/dL — ABNORMAL LOW (ref 13.0–17.0)
O2 Saturation: 86 %
Patient temperature: 98.6
Potassium: 4.1 mmol/L (ref 3.5–5.1)
Sodium: 139 mmol/L (ref 135–145)
TCO2: 12 mmol/L — ABNORMAL LOW (ref 22–32)
pCO2 arterial: 40.5 mmHg (ref 32–48)
pH, Arterial: 7.033 — CL (ref 7.35–7.45)
pO2, Arterial: 74 mmHg — ABNORMAL LOW (ref 83–108)

## 2021-12-20 LAB — CBC
HCT: 33.6 % — ABNORMAL LOW (ref 39.0–52.0)
Hemoglobin: 10.6 g/dL — ABNORMAL LOW (ref 13.0–17.0)
MCH: 30.5 pg (ref 26.0–34.0)
MCHC: 31.5 g/dL (ref 30.0–36.0)
MCV: 96.6 fL (ref 80.0–100.0)
Platelets: 292 10*3/uL (ref 150–400)
RBC: 3.48 MIL/uL — ABNORMAL LOW (ref 4.22–5.81)
RDW: 15 % (ref 11.5–15.5)
WBC: 17.1 10*3/uL — ABNORMAL HIGH (ref 4.0–10.5)
nRBC: 0 % (ref 0.0–0.2)

## 2021-12-20 LAB — MRSA NEXT GEN BY PCR, NASAL: MRSA by PCR Next Gen: NOT DETECTED

## 2021-12-20 LAB — POCT I-STAT 7, (LYTES, BLD GAS, ICA,H+H)
Acid-base deficit: 19 mmol/L — ABNORMAL HIGH (ref 0.0–2.0)
Bicarbonate: 10.2 mmol/L — ABNORMAL LOW (ref 20.0–28.0)
Calcium, Ion: 1.34 mmol/L (ref 1.15–1.40)
HCT: 31 % — ABNORMAL LOW (ref 39.0–52.0)
Hemoglobin: 10.5 g/dL — ABNORMAL LOW (ref 13.0–17.0)
O2 Saturation: 94 %
Patient temperature: 35.9
Potassium: 3.4 mmol/L — ABNORMAL LOW (ref 3.5–5.1)
Sodium: 141 mmol/L (ref 135–145)
TCO2: 11 mmol/L — ABNORMAL LOW (ref 22–32)
pCO2 arterial: 32.9 mmHg (ref 32–48)
pH, Arterial: 7.09 — CL (ref 7.35–7.45)
pO2, Arterial: 90 mmHg (ref 83–108)

## 2021-12-20 LAB — BETA-HYDROXYBUTYRIC ACID: Beta-Hydroxybutyric Acid: 0.62 mmol/L — ABNORMAL HIGH (ref 0.05–0.27)

## 2021-12-20 LAB — I-STAT VENOUS BLOOD GAS, ED
Acid-base deficit: 16 mmol/L — ABNORMAL HIGH (ref 0.0–2.0)
Bicarbonate: 10.7 mmol/L — ABNORMAL LOW (ref 20.0–28.0)
Calcium, Ion: 1.13 mmol/L — ABNORMAL LOW (ref 1.15–1.40)
HCT: 32 % — ABNORMAL LOW (ref 39.0–52.0)
Hemoglobin: 10.9 g/dL — ABNORMAL LOW (ref 13.0–17.0)
O2 Saturation: 45 %
Potassium: 3.8 mmol/L (ref 3.5–5.1)
Sodium: 138 mmol/L (ref 135–145)
TCO2: 11 mmol/L — ABNORMAL LOW (ref 22–32)
pCO2, Ven: 26.8 mmHg — ABNORMAL LOW (ref 44–60)
pH, Ven: 7.208 — ABNORMAL LOW (ref 7.25–7.43)
pO2, Ven: 29 mmHg — CL (ref 32–45)

## 2021-12-20 LAB — URINALYSIS, MICROSCOPIC (REFLEX)

## 2021-12-20 LAB — COMPREHENSIVE METABOLIC PANEL
ALT: 14 U/L (ref 0–44)
AST: 18 U/L (ref 15–41)
Albumin: 3.3 g/dL — ABNORMAL LOW (ref 3.5–5.0)
Alkaline Phosphatase: 56 U/L (ref 38–126)
Anion gap: 12 (ref 5–15)
BUN: 42 mg/dL — ABNORMAL HIGH (ref 8–23)
CO2: 11 mmol/L — ABNORMAL LOW (ref 22–32)
Calcium: 8.5 mg/dL — ABNORMAL LOW (ref 8.9–10.3)
Chloride: 113 mmol/L — ABNORMAL HIGH (ref 98–111)
Creatinine, Ser: 2.19 mg/dL — ABNORMAL HIGH (ref 0.61–1.24)
GFR, Estimated: 31 mL/min — ABNORMAL LOW (ref >60)
Glucose, Bld: 236 mg/dL — ABNORMAL HIGH (ref 70–99)
Potassium: 3.9 mmol/L (ref 3.5–5.1)
Sodium: 136 mmol/L (ref 135–145)
Total Bilirubin: 0.6 mg/dL (ref 0.3–1.2)
Total Protein: 6.6 g/dL (ref 6.5–8.1)

## 2021-12-20 LAB — URINALYSIS, ROUTINE W REFLEX MICROSCOPIC
Bilirubin Urine: NEGATIVE
Glucose, UA: >=500 mg/dL — AB
Ketones, ur: NEGATIVE mg/dL
Leukocytes,Ua: NEGATIVE
Nitrite: NEGATIVE
Protein, ur: 30 mg/dL — AB
Specific Gravity, Urine: 1.015 (ref 1.005–1.030)
pH: 6 (ref 5.0–8.0)

## 2021-12-20 LAB — BASIC METABOLIC PANEL
Anion gap: 13 (ref 5–15)
BUN: 44 mg/dL — ABNORMAL HIGH (ref 8–23)
CO2: 10 mmol/L — ABNORMAL LOW (ref 22–32)
Calcium: 8.4 mg/dL — ABNORMAL LOW (ref 8.9–10.3)
Chloride: 115 mmol/L — ABNORMAL HIGH (ref 98–111)
Creatinine, Ser: 2.1 mg/dL — ABNORMAL HIGH (ref 0.61–1.24)
GFR, Estimated: 33 mL/min — ABNORMAL LOW (ref >60)
Glucose, Bld: 243 mg/dL — ABNORMAL HIGH (ref 70–99)
Potassium: 4.2 mmol/L (ref 3.5–5.1)
Sodium: 138 mmol/L (ref 135–145)

## 2021-12-20 LAB — RESP PANEL BY RT-PCR (FLU A&B, COVID) ARPGX2
Influenza A by PCR: NEGATIVE
Influenza B by PCR: NEGATIVE
SARS Coronavirus 2 by RT PCR: NEGATIVE

## 2021-12-20 LAB — MAGNESIUM: Magnesium: 1.8 mg/dL (ref 1.7–2.4)

## 2021-12-20 LAB — CREATININE, SERUM
Creatinine, Ser: 2.16 mg/dL — ABNORMAL HIGH (ref 0.61–1.24)
GFR, Estimated: 32 mL/min — ABNORMAL LOW (ref >60)

## 2021-12-20 LAB — GLUCOSE, CAPILLARY
Glucose-Capillary: 237 mg/dL — ABNORMAL HIGH (ref 70–99)
Glucose-Capillary: 213 mg/dL — ABNORMAL HIGH (ref 70–99)
Glucose-Capillary: 176 mg/dL — ABNORMAL HIGH (ref 70–99)

## 2021-12-20 LAB — PHOSPHORUS: Phosphorus: 6.1 mg/dL — ABNORMAL HIGH (ref 2.5–4.6)

## 2021-12-20 LAB — CBG MONITORING, ED: Glucose-Capillary: 238 mg/dL — ABNORMAL HIGH (ref 70–99)

## 2021-12-20 LAB — LACTIC ACID, PLASMA
Lactic Acid, Venous: 1.7 mmol/L (ref 0.5–1.9)
Lactic Acid, Venous: 2.7 mmol/L (ref 0.5–1.9)
Lactic Acid, Venous: 3.2 mmol/L (ref 0.5–1.9)

## 2021-12-20 IMAGING — CR DG CHEST 1V
1 series · 1 of 1 positions shown · non-contrast
Comparison: [DATE]

CLINICAL DATA: Stroke.

EXAM:
CHEST  1 VIEW

[chest ap]
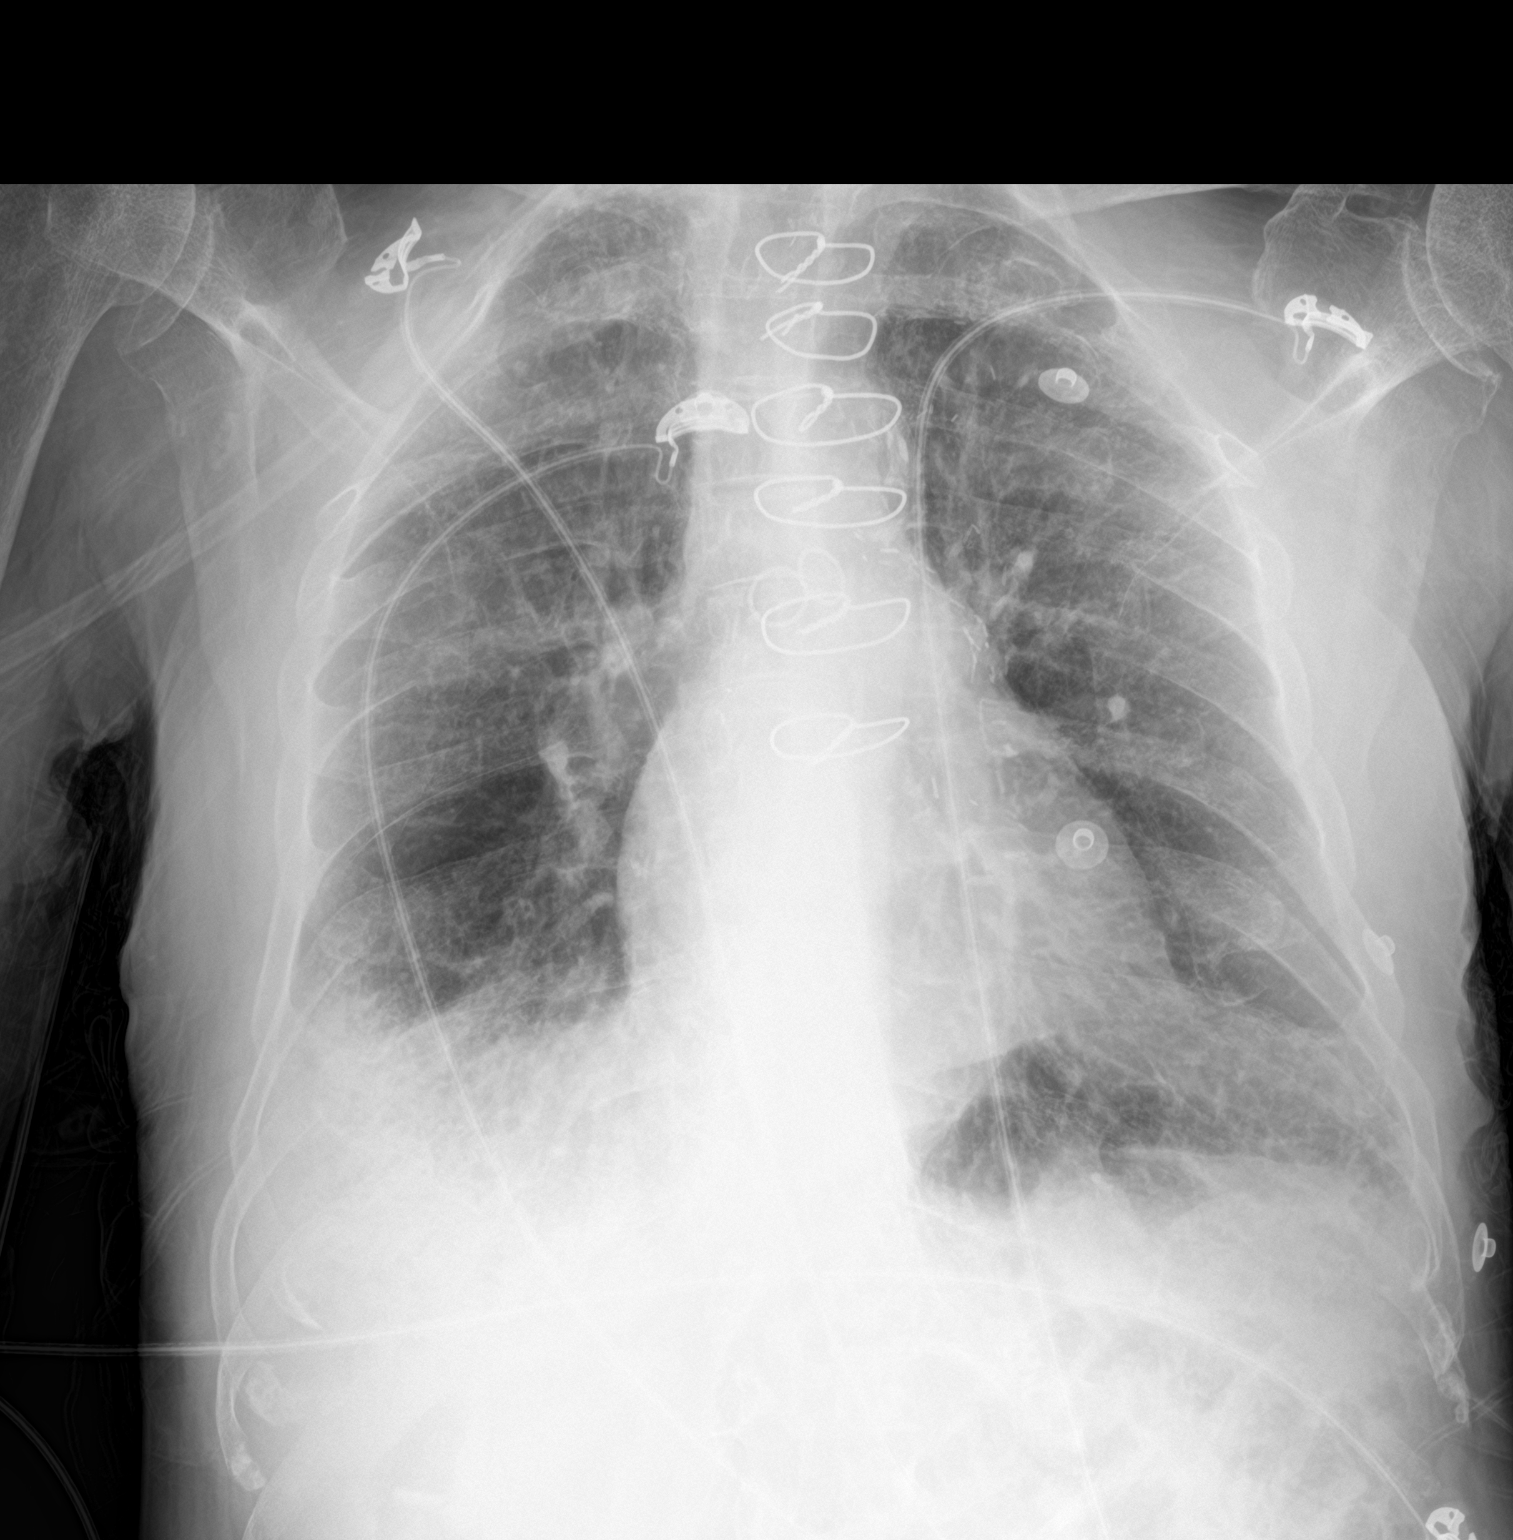

[1 of 1 positions shown; findings below may reference images not displayed]

FINDINGS: Normal heart size. Status post median sternotomy and CABG procedure.
Diffuse chronic interstitial coarsening identified. Dense airspace
consolidation is identified involving the right lower lobe
compatible with pneumonia and or aspiration. Left lung is clear.
IMPRESSION: Right lower lobe airspace consolidation compatible with pneumonia
and/or aspiration.

## 2021-12-20 IMAGING — CT CT HEAD W/O CM
3 of 4 series · 13 of 47 positions shown, 15 images · non-contrast
Comparison: [KI]

CLINICAL DATA: Mental status change, unknown cause



[Series 2: head without · axial · non-contrast · 0.39mm/px · z∈[-126,-16]mm · 7 of 30 slices shown, 9 images]
[im 4/30  brain]
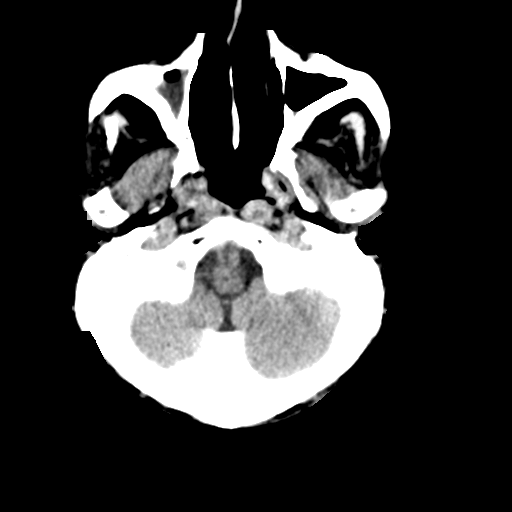
[im 4/30  bone]
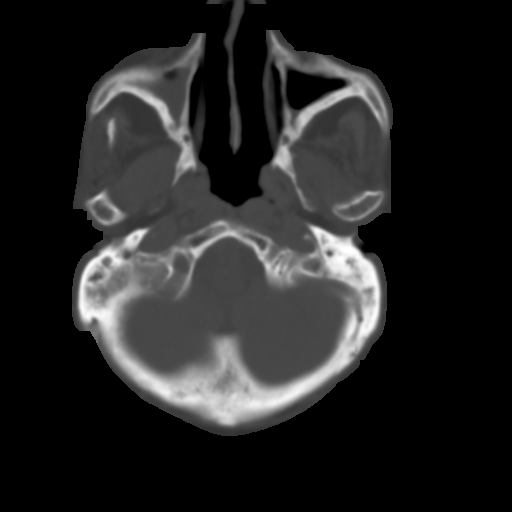
[im 8/30  brain]
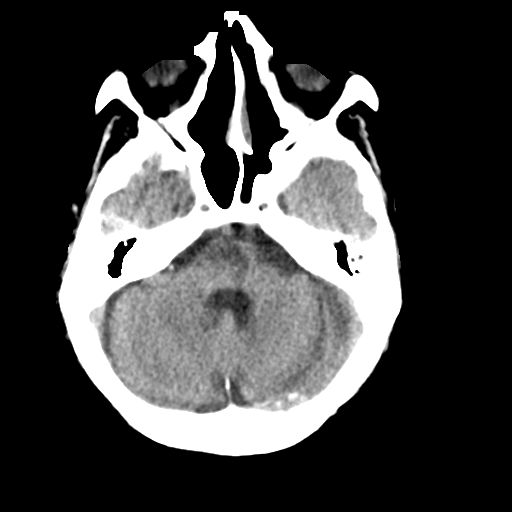
[im 11/30  brain]
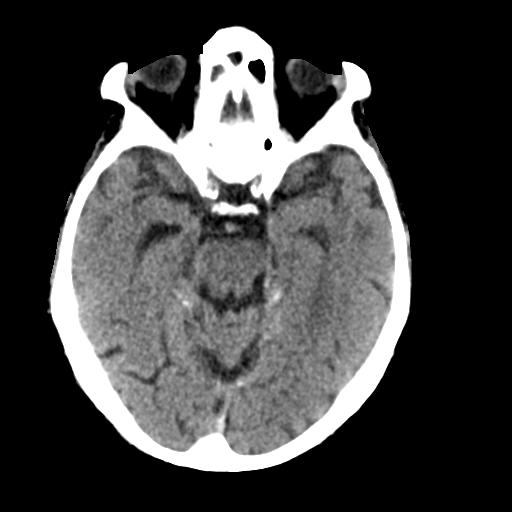
[im 15/30  brain]
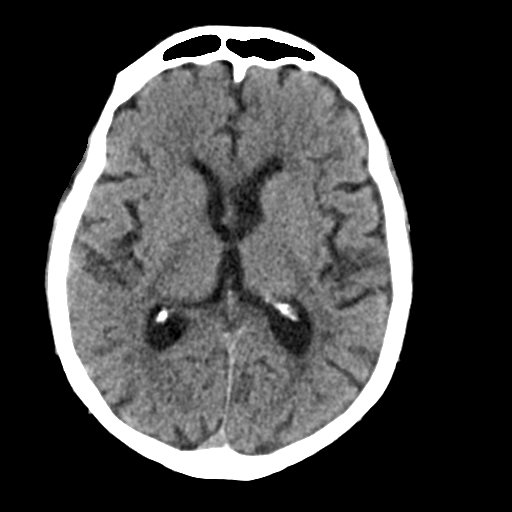
[im 19/30  brain]
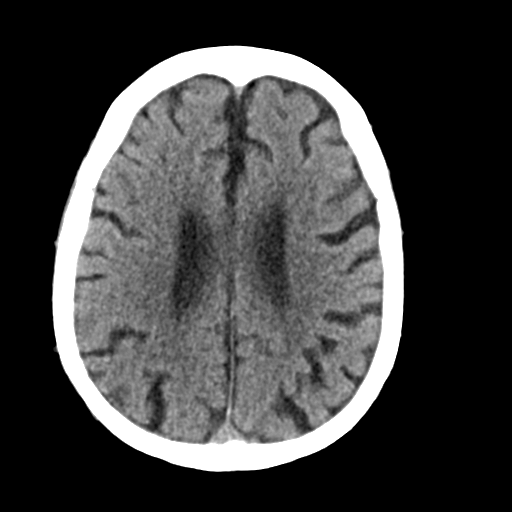
[im 19/30  bone]
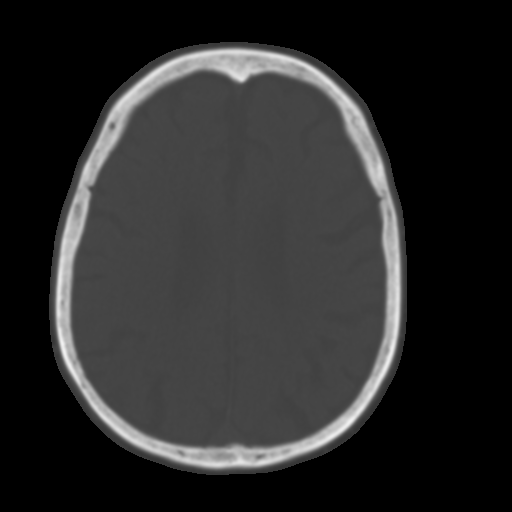
[im 22/30  brain]
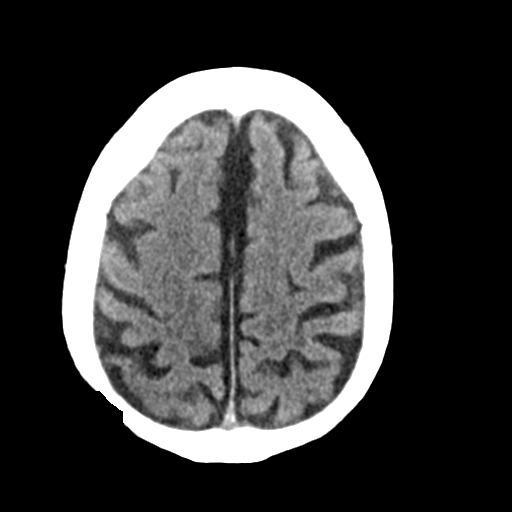
[im 26/30  brain]
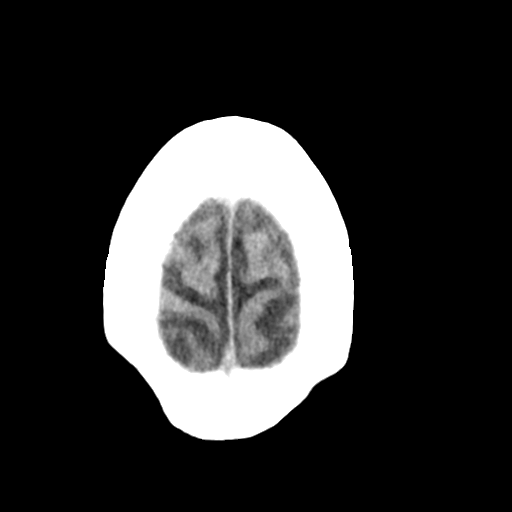

[Series 4: head without cor · coronal · non-contrast · 0.29mm/px · 3 of 65 slices shown]
[im 22/65  brain]
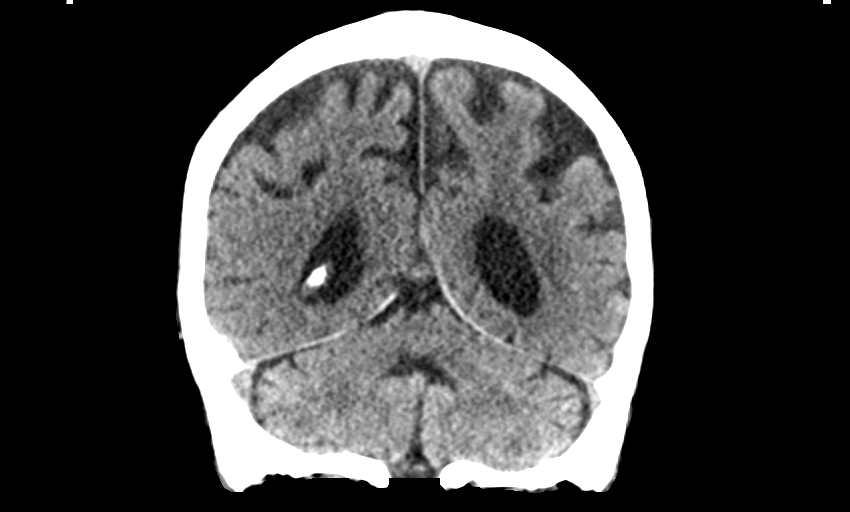
[im 29/65  brain]
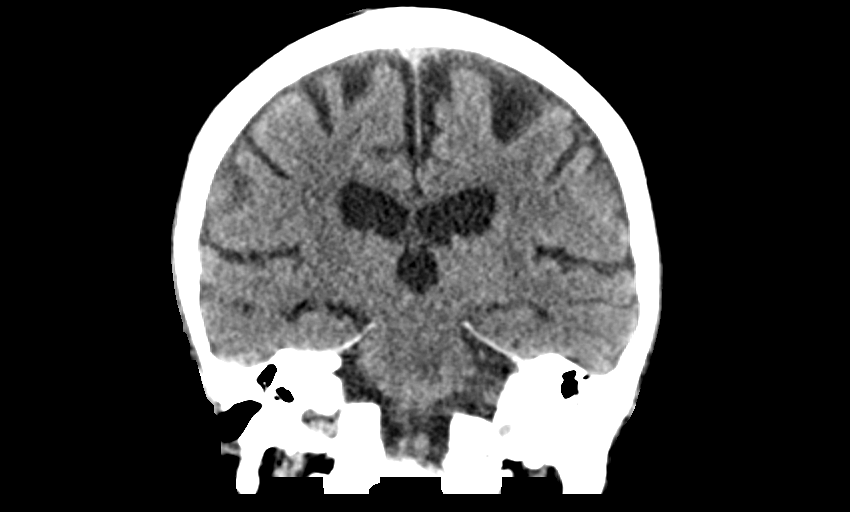
[im 36/65  brain]
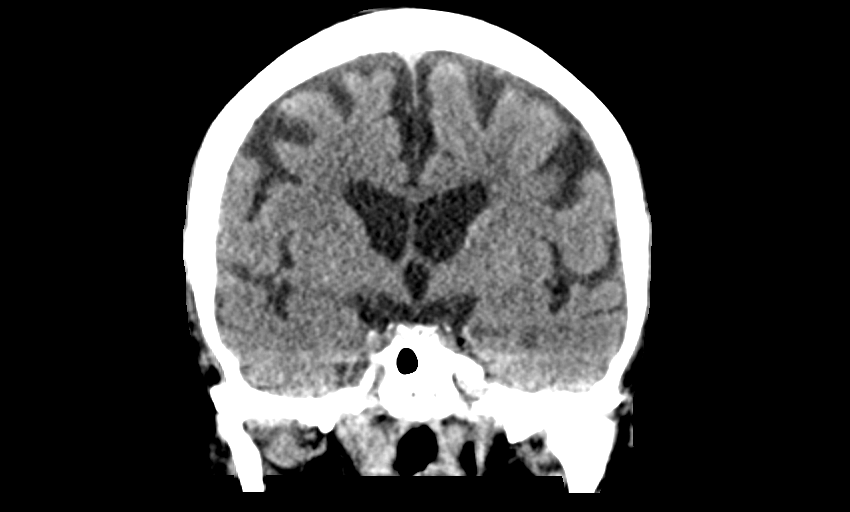

[Series 5: head without sag · sagittal · non-contrast · 0.29mm/px · 3 of 62 slices shown]
[im 21/62  brain]
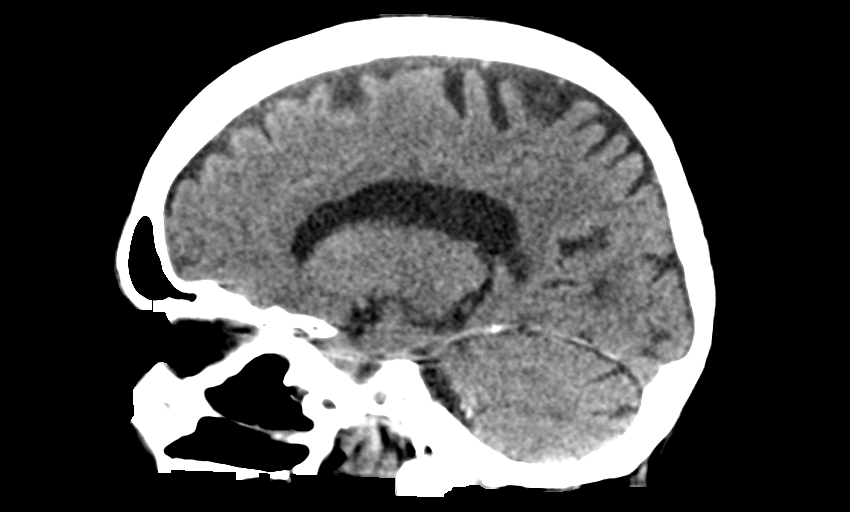
[im 31/62  brain]
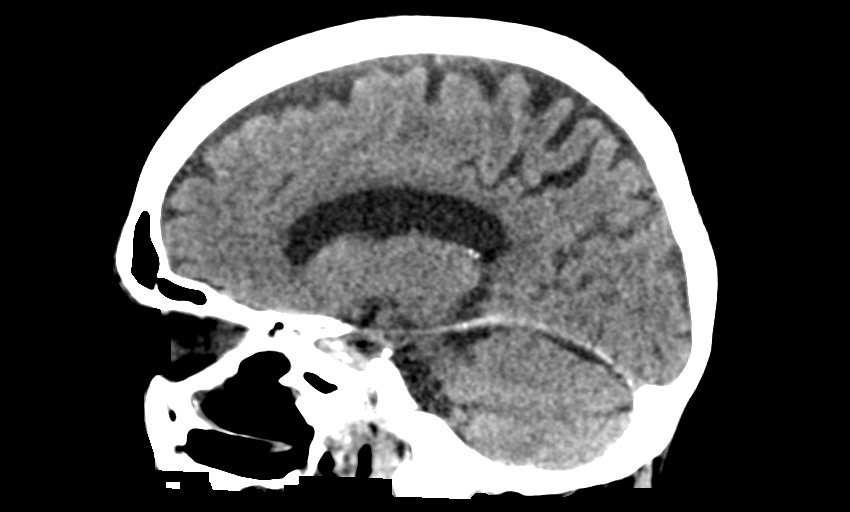
[im 41/62  brain]
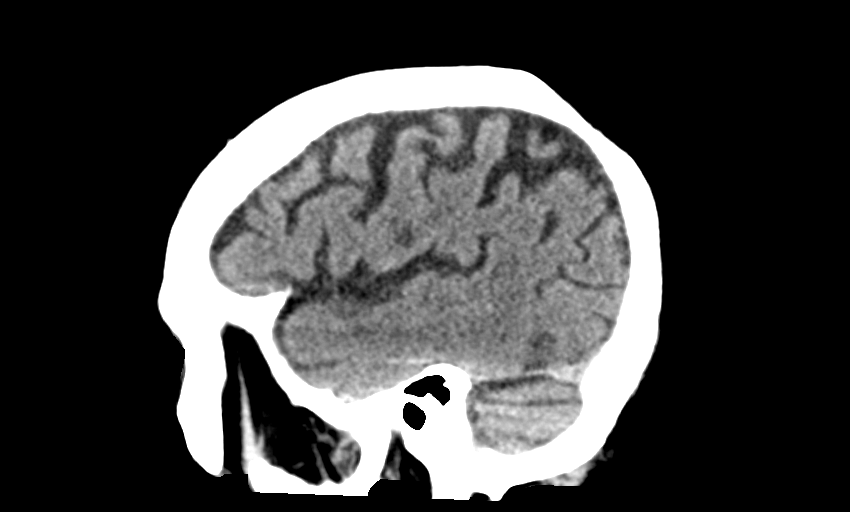

[13 of 47 positions shown; findings below may reference images not displayed]

FINDINGS: Brain: There is no acute intracranial hemorrhage, mass effect, or
edema. Gray-white differentiation is preserved. There is no
extra-axial fluid collection. Prominence of the ventricles and sulci
reflects similar parenchymal volume loss. Patchy hypoattenuation in
the supratentorial and pontine white matter is nonspecific but may
reflect chronic microvascular ischemic changes.

Vascular: There is atherosclerotic calcification at the skull base.

Skull: Calvarium is unremarkable apart from incidental thinning of
the parietal calvarium bilaterally.

Sinuses/Orbits: Extensive sinonasal surgery with mucosal thickening.
Bilateral lens replacements.

Other: Patchy mastoid opacification.
IMPRESSION: No acute intracranial abnormality.

Chronic microvascular ischemic changes.

## 2021-12-20 IMAGING — CT CT ABD-PELV W/O CM
2 of 4 series · 16 of 46 positions shown, 18 images · non-contrast
Comparison: None

CLINICAL DATA: Abdominal pain, possible pneumonia

EXAM:
CT ABDOMEN AND PELVIS WITHOUT CONTRAST
TECHNIQUE: Multidetector CT imaging of the abdomen and pelvis was performed
following the standard protocol without IV contrast. Examination
degraded by motion artifacts.
RADIATION DOSE REDUCTION: This exam was performed according to the
departmental dose-optimization program which includes automated
exposure control, adjustment of the mA and/or kV according to
patient size and/or use of iterative reconstruction technique.

[Series 4: a/p w/o cor · coronal · non-contrast · 0.63mm/px · 3 of 151 slices shown]
[im 51/151  soft-tissue]
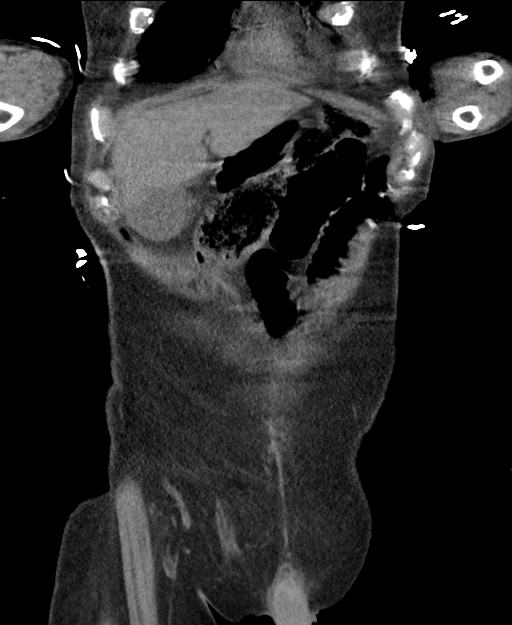
[im 67/151  soft-tissue]
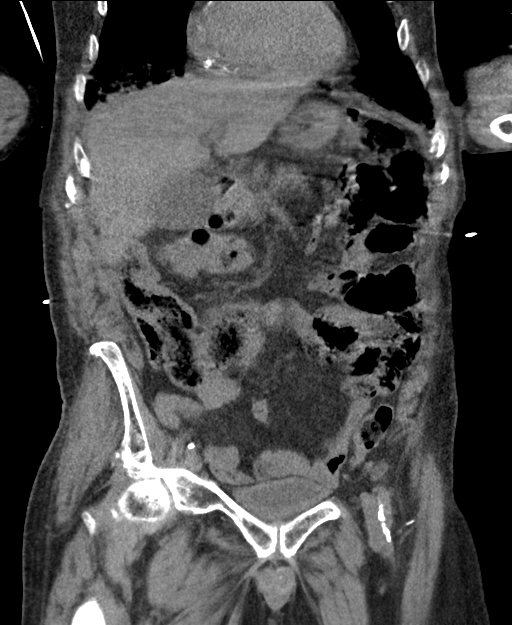
[im 84/151  soft-tissue]
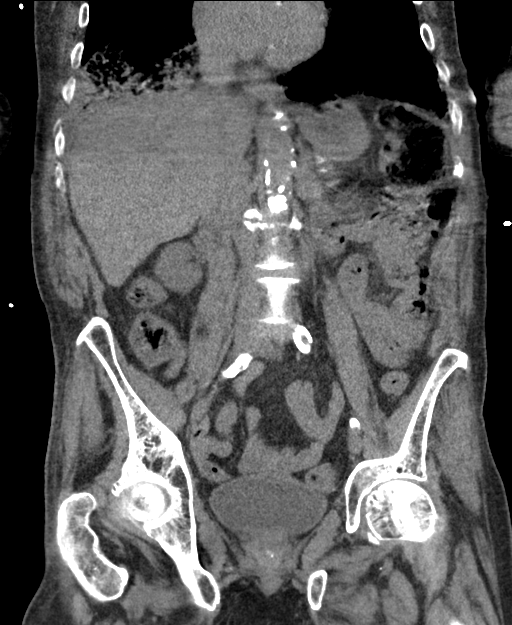

[Series 5: a/p w/o 5mm · axial · non-contrast · 0.68mm/px · z∈[+806,+1166]mm · 13 of 80 slices shown, 15 images]
[im 4/80  soft-tissue]
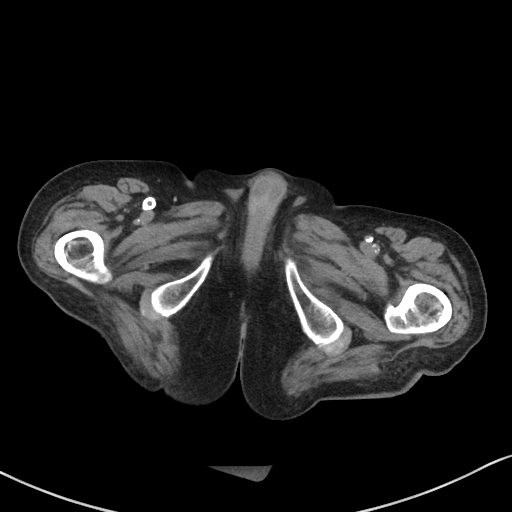
[im 4/80  bone]
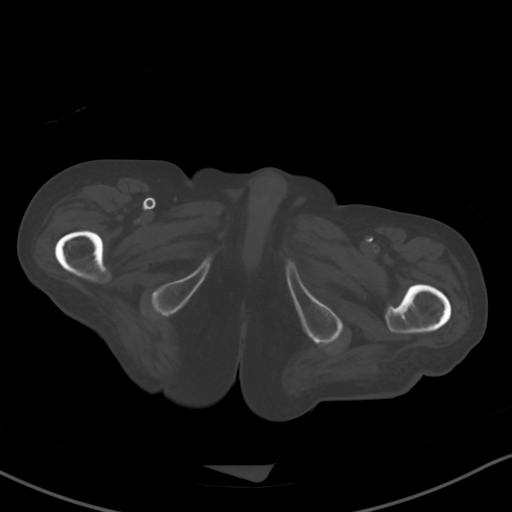
[im 11/80  soft-tissue]
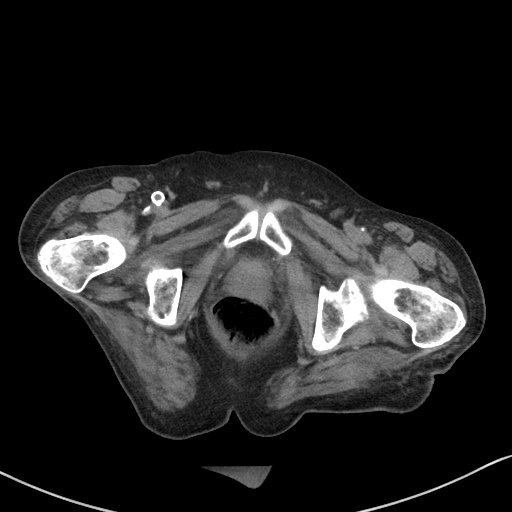
[im 18/80  soft-tissue]
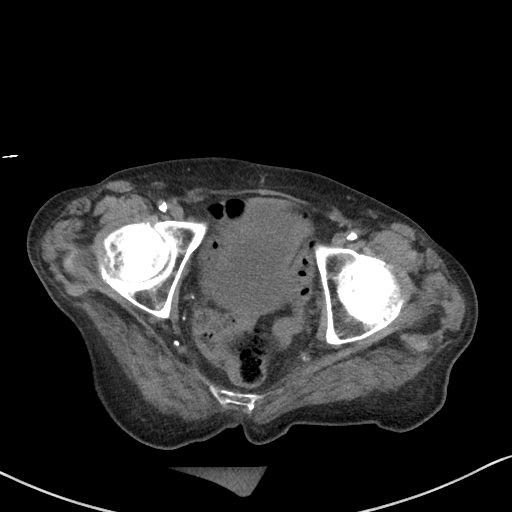
[im 21/80  soft-tissue]
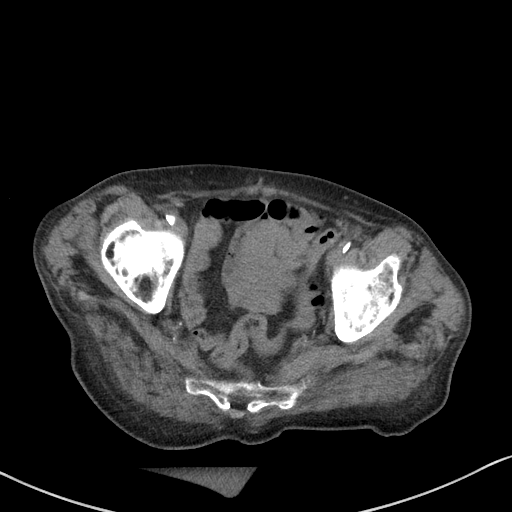
[im 28/80  soft-tissue]
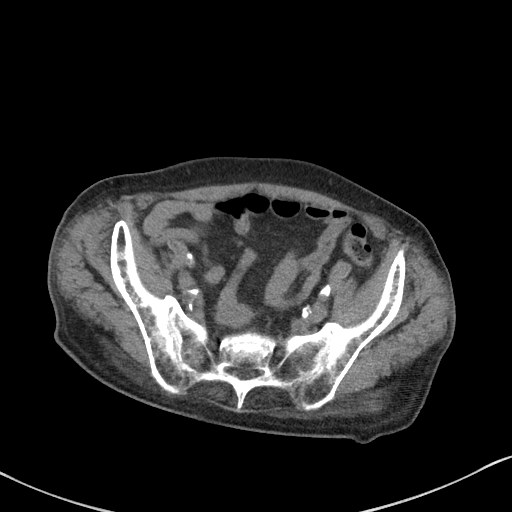
[im 35/80  soft-tissue]
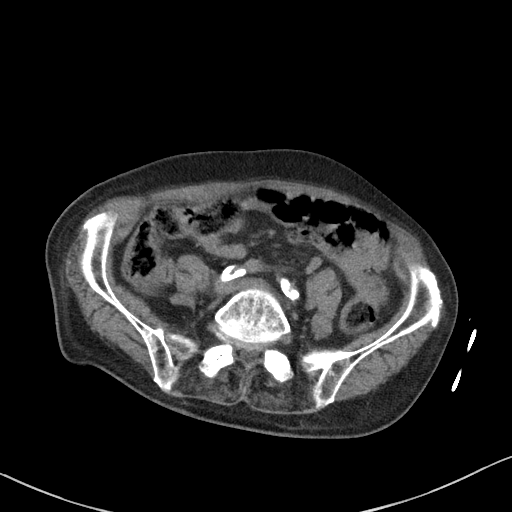
[im 42/80  soft-tissue]
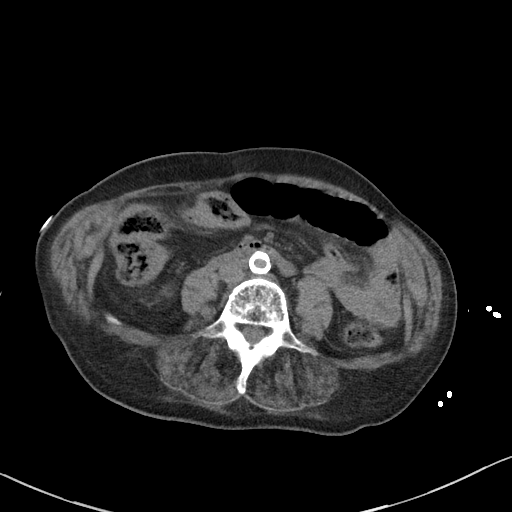
[im 45/80  soft-tissue]
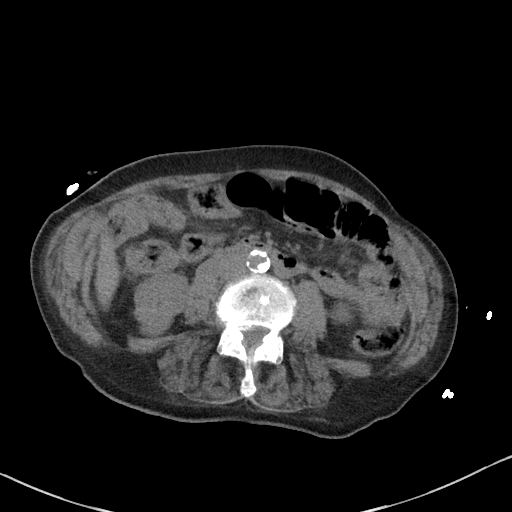
[im 52/80  soft-tissue]
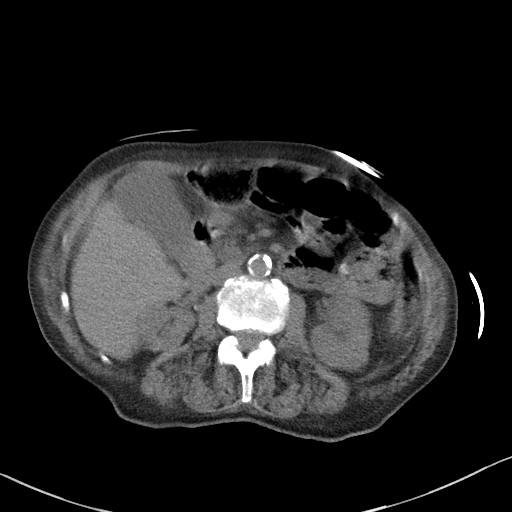
[im 52/80  bone]
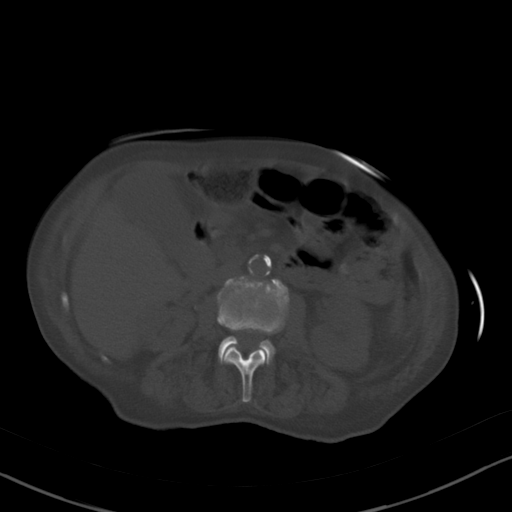
[im 59/80  soft-tissue]
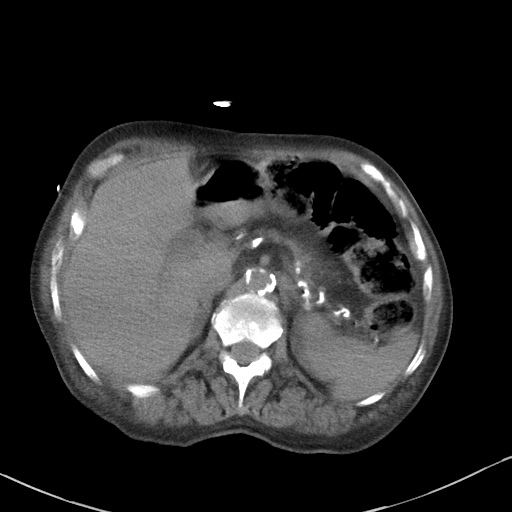
[im 62/80  soft-tissue]
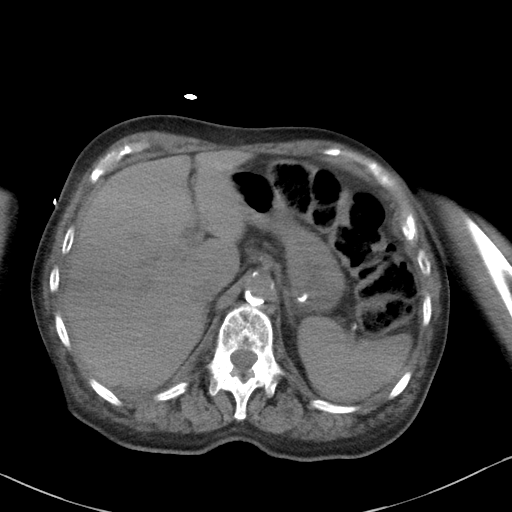
[im 69/80  soft-tissue]
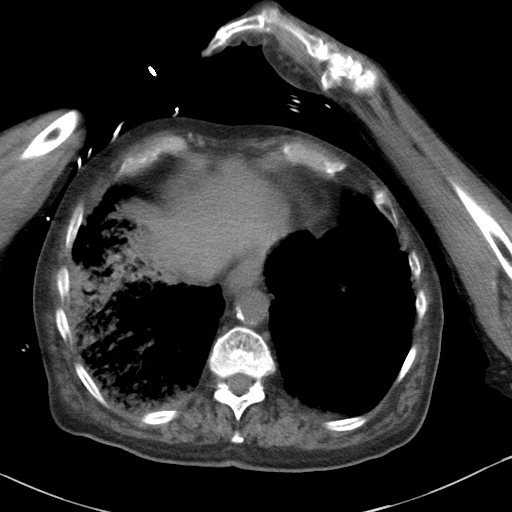
[im 76/80  soft-tissue]
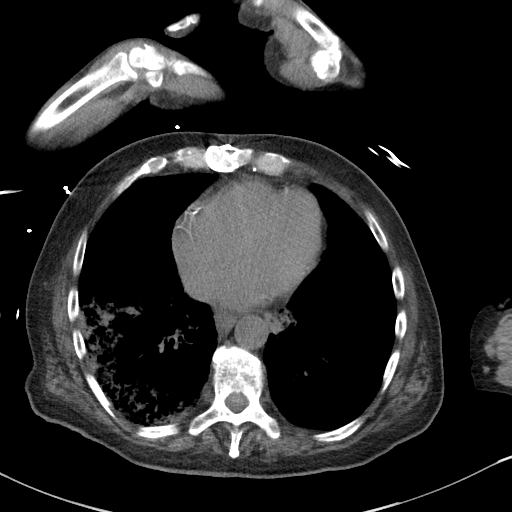

[16 of 46 positions shown; findings below may reference images not displayed]

FINDINGS: Lower chest: RIGHT lower lobe and mild RIGHT middle lobe infiltrate
consistent with pneumonia. Minimal patchy infiltrate LEFT lower
lobe.

Hepatobiliary: Distended gallbladder. Liver normal appearance. No
calcified gallstones identified.

Pancreas: Atrophic pancreas without obvious mass

Spleen: Normal appearance

Adrenals/Urinary Tract: Adrenal glands unremarkable. LEFT kidney
normal appearance. Tiny nonobstructing RIGHT renal calculus without
hydronephrosis. Ureters and bladder unremarkable.

Stomach/Bowel: Stomach and bowel loops grossly normal appearance.
Normal appendix.

Vascular/Lymphatic: Extensive atherosclerotic calcifications aorta,
iliac arteries, femoral arteries, some visceral arteries, coronary
arteries. Aorta normal caliber. Stent RIGHT superficial femoral
artery. No adenopathy.

Reproductive: Unremarkable prostate gland and seminal vesicles

Other: No free air or free fluid.  No hernia.

Musculoskeletal: Osseous demineralization.
IMPRESSION: RIGHT lower lobe and mild RIGHT middle lobe infiltrate consistent
with pneumonia, with minimal patchy infiltrate LEFT lower lobe.

Tiny nonobstructing RIGHT renal calculus.

Extensive atherosclerotic disease changes with a RIGHT superficial
femoral artery stent.

No acute intra-abdominal or intrapelvic abnormalities identified
within limitations of scattered motion artifacts.

Aortic Atherosclerosis ([9C]-[9C]).

## 2021-12-20 IMAGING — CT CT CHEST W/O CM
2 of 3 series · 15 of 36 positions shown, 18 images · non-contrast
Comparison: None.

CLINICAL DATA: Pneumonia.  Evaluate for complications.



[Series 3: chest w/o 2mm st cor · coronal · non-contrast · 0.59mm/px · 3 of 149 slices shown]
[im 30/149  lung]
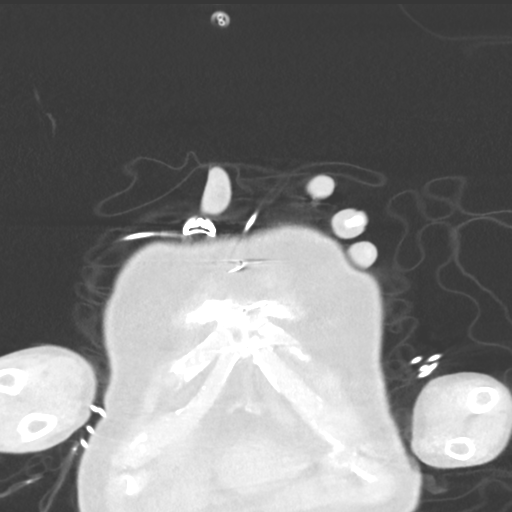
[im 60/149  lung]
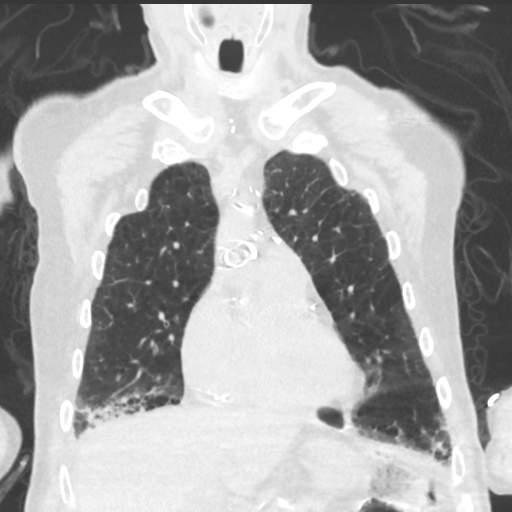
[im 89/149  lung]
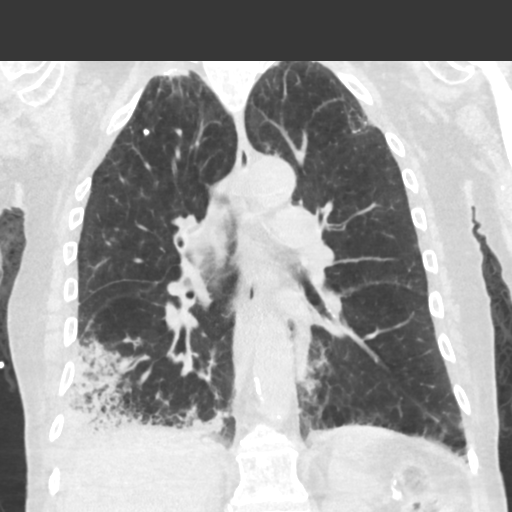

[Series 5: chest w/o 2mm st · axial · non-contrast · 0.64mm/px · z∈[+1100,+1352]mm · 12 of 148 slices shown, 15 images]
[im 11/148  mediastinal]
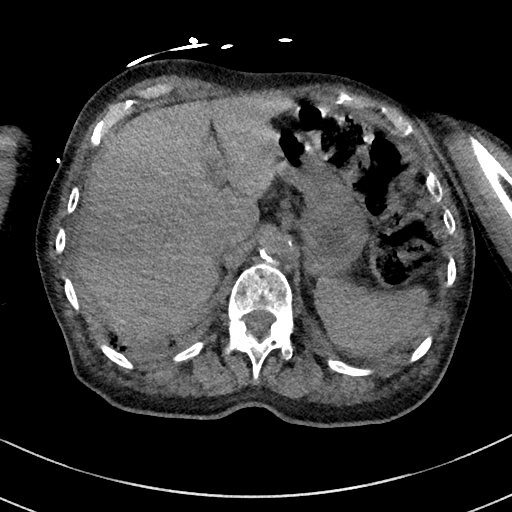
[im 11/148  lung]
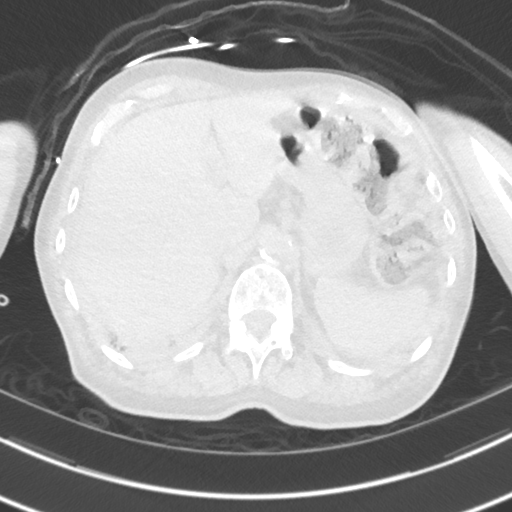
[im 22/148  lung]
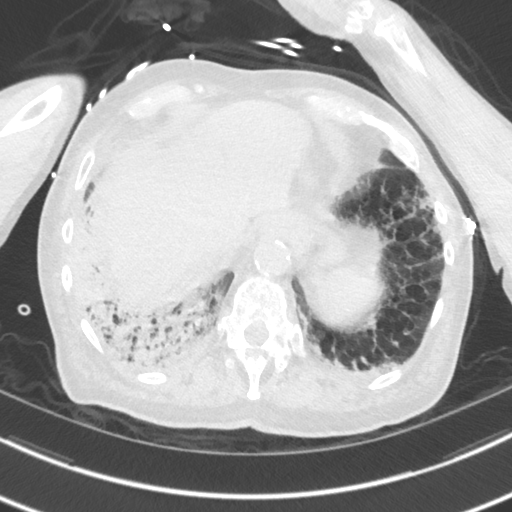
[im 33/148  lung]
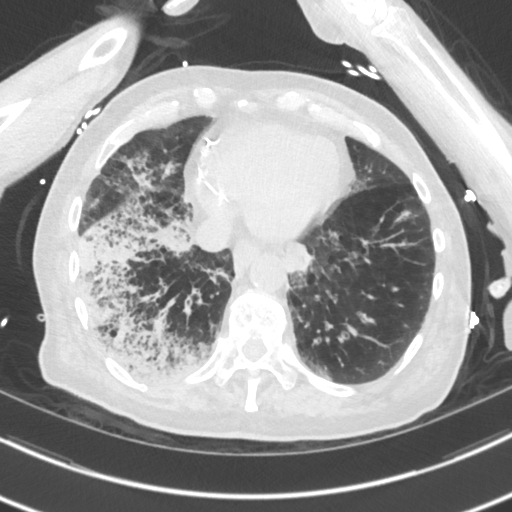
[im 44/148  lung]
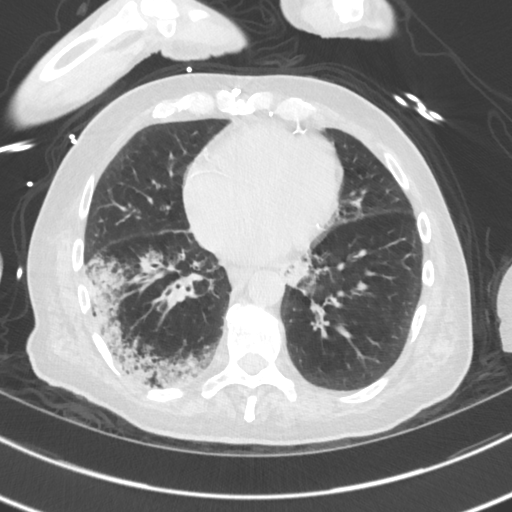
[im 55/148  mediastinal]
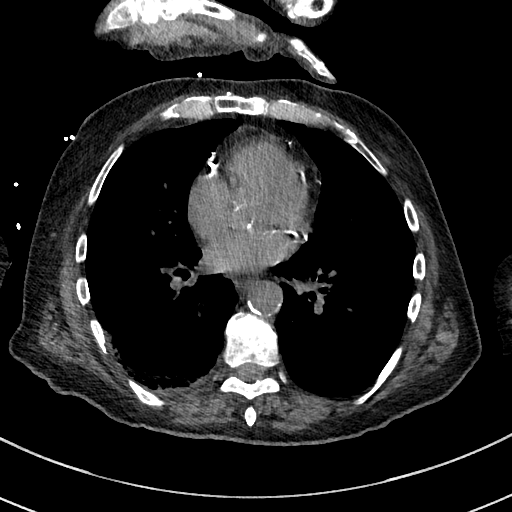
[im 55/148  lung]
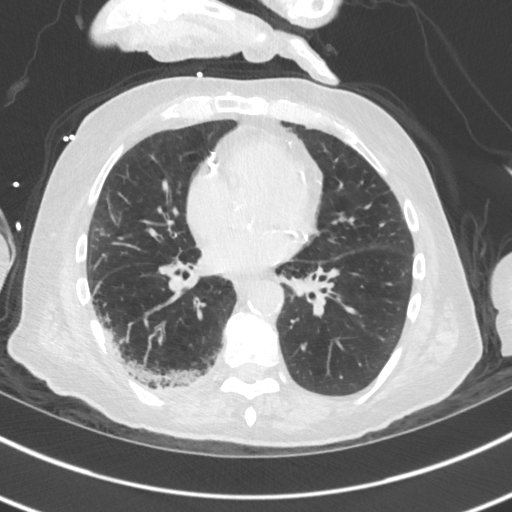
[im 66/148  lung]
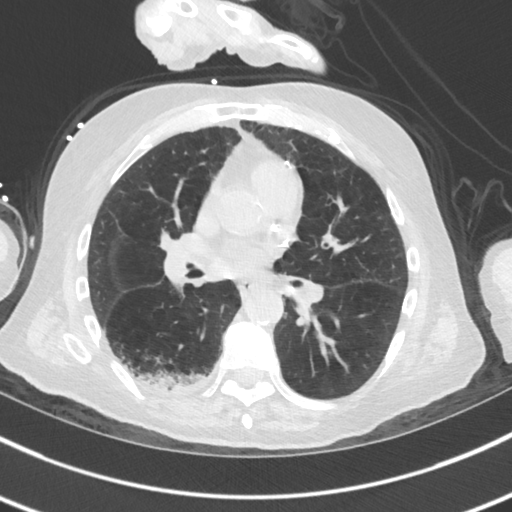
[im 82/148  lung]
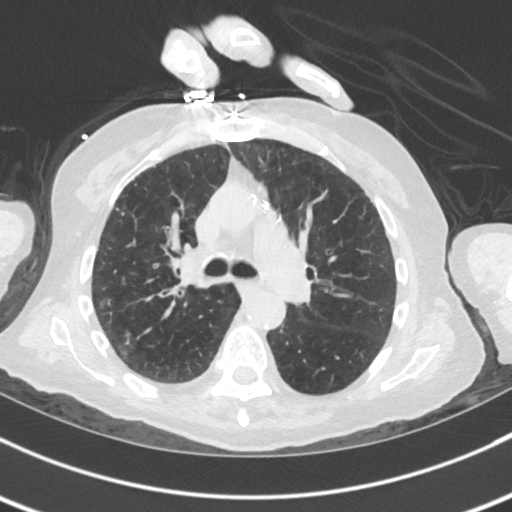
[im 93/148  lung]
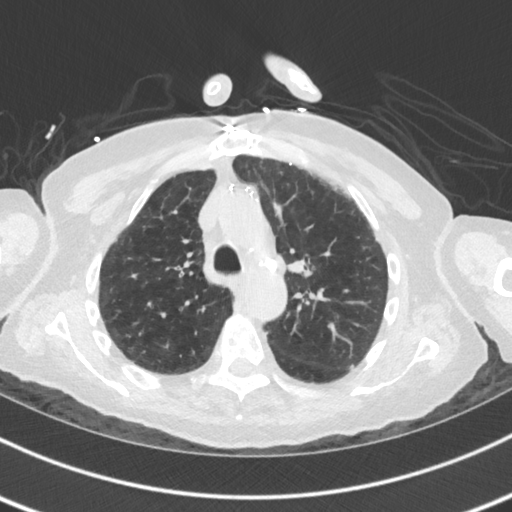
[im 104/148  mediastinal]
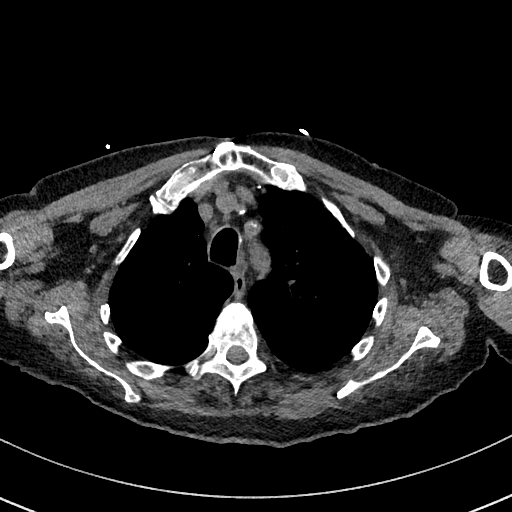
[im 104/148  lung]
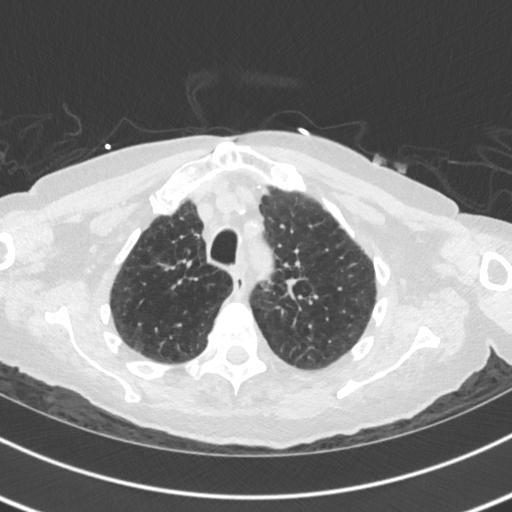
[im 115/148  lung]
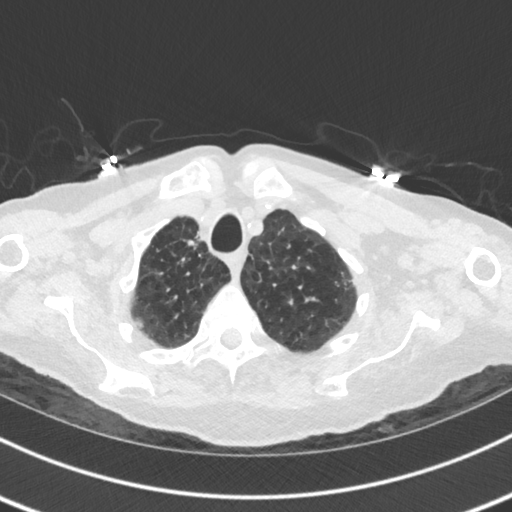
[im 126/148  lung]
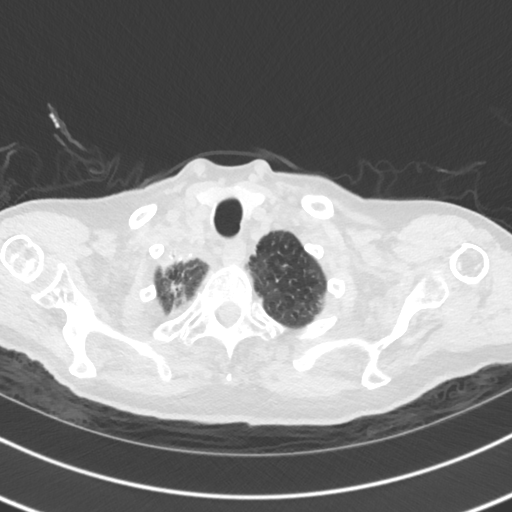
[im 137/148  lung]
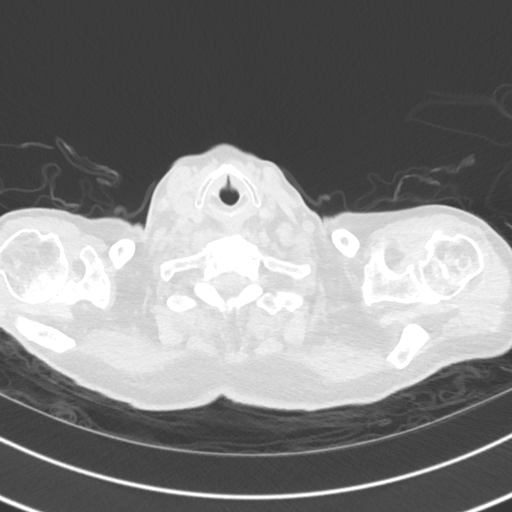

[15 of 36 positions shown; findings below may reference images not displayed]

FINDINGS: Cardiovascular: The heart size is normal. Aortic atherosclerosis.
Status post CABG. No pericardial effusion.

Mediastinum/Nodes: Thyroid gland, trachea and esophagus demonstrate
no significant findings. No enlarged axillary, supraclavicular, or
mediastinal lymph nodes. Hilar lymph nodes are suboptimally
evaluated due to lack of IV contrast.

Lungs/Pleura: Moderate changes of centrilobular and paraseptal
emphysema with diffuse bronchial wall thickening. Dense airspace
consolidation is identified within the right lower lobe. There is
also airspace densities within the inferior right middle lobe. Pat
patchy subpleural densities identified within the medial left lower
lobe and posterior left base. The no pleural effusions or
interstitial edema. No suspicious pulmonary nodule or mass
identified at this time.

Upper Abdomen: No acute abnormality

Musculoskeletal: No chest wall mass or suspicious bone lesions
identified. Status post median sternotomy
IMPRESSION: 1. Dense airspace consolidation within the right lower lobe with
additional airspace densities within the inferior right middle lobe,
medial left lower lobe and posterior left base. Findings are
compatible with multifocal pneumonia. Followup imaging is
recommended in 3-4 weeks following trial of antibiotic therapy to
ensure resolution and exclude underlying malignancy.
2. Diffuse bronchial wall thickening with emphysema, as above;
imaging findings suggestive of underlying COPD.
3. Status post CABG.
4. Aortic Atherosclerosis ([FZ]-[FZ]) and Emphysema ([FZ]-[FZ]).

## 2021-12-20 IMAGING — DX DG CHEST 1V PORT
1 series · 1 of 1 positions shown · non-contrast
Comparison: Imaging from [DATE].
COMPARISON: Imaging from [DATE].

Addendum:
CLINICAL DATA: Post intubation in a 70-year-old male.

EXAM:
PORTABLE CHEST 1 VIEW

[chest ap]
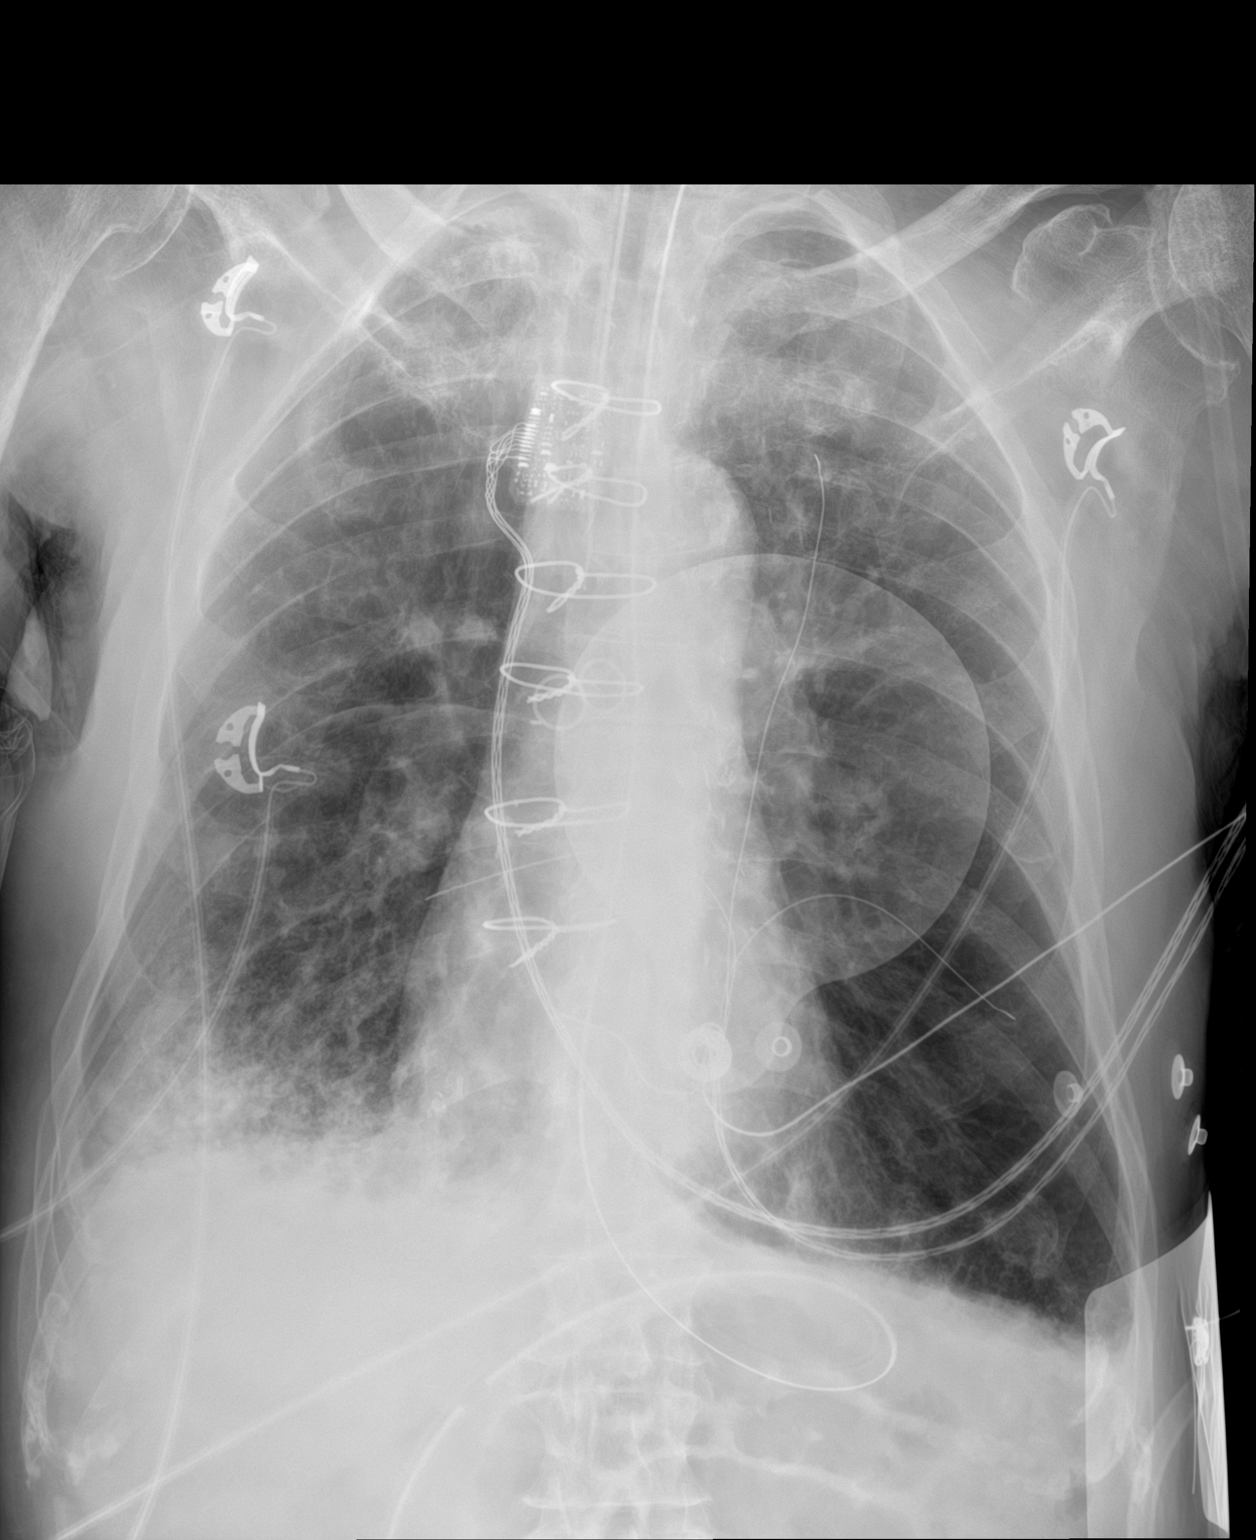

[1 of 1 positions shown; findings below may reference images not displayed]

FINDINGS: Median sternotomy changes. Pacer defibrillator pads project over the
patient's chest and obscure the central chest partially.

Post gastric tube insertion, side port below GE junction, tip likely
in the gastric antrum or in the proximal duodenum off the field of
view.

Endotracheal tube in site 2. Localization of carina slightly
impaired by overlying structures including pacer defibrillator pads
but suspect the tube is approximately 5-7 mm above the carina.

Cardiomediastinal contours and hilar structures are stable.

Patchy ill-defined airspace disease noted at the RIGHT lung base. No
pneumothorax. No sign of pleural effusion.

On limited assessment there is no acute skeletal process.
IMPRESSION: 1. Endotracheal tube tip appears to be at the carina though with
difficult assessment on current imaging. Consider approximately
cm retraction for more optimal placement.
2. Post gastric tube insertion, side port below the GE junction, tip
likely in the gastric antrum or in the proximal duodenum off the
field of view.
3. Signs of RIGHT lower lobe pneumonia or aspiration. LEFT lung base
airspace process not as well seen as on recent chest CT.

ADDENDUM:
These results were called by telephone at the time of interpretation
on [DATE] at [DATE] to provider Dr. MINNIS , who verbally
acknowledged these results.

*** End of Addendum ***
FINDINGS: Median sternotomy changes. Pacer defibrillator pads project over the
patient's chest and obscure the central chest partially.

Post gastric tube insertion, side port below GE junction, tip likely
in the gastric antrum or in the proximal duodenum off the field of
view.

Endotracheal tube in site 2. Localization of carina slightly
impaired by overlying structures including pacer defibrillator pads
but suspect the tube is approximately 5-7 mm above the carina.

Cardiomediastinal contours and hilar structures are stable.

Patchy ill-defined airspace disease noted at the RIGHT lung base. No
pneumothorax. No sign of pleural effusion.

On limited assessment there is no acute skeletal process.
IMPRESSION: 1. Endotracheal tube tip appears to be at the carina though with
difficult assessment on current imaging. Consider approximately
cm retraction for more optimal placement.
2. Post gastric tube insertion, side port below the GE junction, tip
likely in the gastric antrum or in the proximal duodenum off the
field of view.
3. Signs of RIGHT lower lobe pneumonia or aspiration. LEFT lung base
airspace process not as well seen as on recent chest CT.

## 2021-12-20 MED ORDER — SUCCINYLCHOLINE CHLORIDE 20 MG/ML IJ SOLN
INTRAMUSCULAR | Status: AC | PRN
  Administered 2021-12-20: 100 mg via INTRAVENOUS

## 2021-12-20 MED ORDER — ONDANSETRON HCL 4 MG/2ML IJ SOLN
4.0000 mg | Freq: Four times a day (QID) | INTRAMUSCULAR | Status: DC | PRN
  Administered 2021-12-22: 4 mg via INTRAVENOUS
  Filled 2021-12-20: qty 2

## 2021-12-20 MED ORDER — ALBUTEROL SULFATE (2.5 MG/3ML) 0.083% IN NEBU
2.5000 mg | INHALATION_SOLUTION | RESPIRATORY_TRACT | Status: DC | PRN
  Filled 2021-12-20: qty 3

## 2021-12-20 MED ORDER — NOREPINEPHRINE 4 MG/250ML-% IV SOLN
2.0000 ug/min | INTRAVENOUS | Status: DC
  Administered 2021-12-21: 4 ug/min via INTRAVENOUS
  Filled 2021-12-20: qty 250

## 2021-12-20 MED ORDER — DEXTROSE 10 % IV SOLN: INTRAVENOUS | Status: DC

## 2021-12-20 MED ORDER — ACETAMINOPHEN 650 MG RE SUPP
650.0000 mg | Freq: Four times a day (QID) | RECTAL | Status: DC | PRN
Start: 2021-12-20 — End: 2021-12-31

## 2021-12-20 MED ORDER — SODIUM CHLORIDE 0.9% FLUSH
3.0000 mL | Freq: Two times a day (BID) | INTRAVENOUS | Status: DC
  Administered 2021-12-20 – 2021-12-23 (×6): 3 mL via INTRAVENOUS
  Administered 2021-12-23: 6 mL via INTRAVENOUS
  Administered 2021-12-24 – 2021-12-27 (×7): 3 mL via INTRAVENOUS
  Administered 2021-12-28: 10 mL via INTRAVENOUS
  Administered 2021-12-28: 3 mL via INTRAVENOUS
  Administered 2021-12-28: 10 mL via INTRAVENOUS
  Administered 2021-12-29 – 2021-12-30 (×3): 3 mL via INTRAVENOUS

## 2021-12-20 MED ORDER — ROCURONIUM BROMIDE 10 MG/ML (PF) SYRINGE
PREFILLED_SYRINGE | INTRAVENOUS | Status: AC
  Filled 2021-12-20: qty 10

## 2021-12-20 MED ORDER — CHLORHEXIDINE GLUCONATE 0.12% ORAL RINSE (MEDLINE KIT)
15.0000 mL | Freq: Two times a day (BID) | OROMUCOSAL | Status: DC
  Administered 2021-12-20 – 2021-12-30 (×18): 15 mL via OROMUCOSAL

## 2021-12-20 MED ORDER — REVEFENACIN 175 MCG/3ML IN SOLN
175.0000 ug | Freq: Every day | RESPIRATORY_TRACT | Status: DC
  Administered 2021-12-21 – 2021-12-31 (×9): 175 ug via RESPIRATORY_TRACT
  Filled 2021-12-20 (×12): qty 3

## 2021-12-20 MED ORDER — LACTATED RINGERS IV SOLN: INTRAVENOUS | Status: AC

## 2021-12-20 MED ORDER — ACETAMINOPHEN 325 MG PO TABS
650.0000 mg | ORAL_TABLET | Freq: Four times a day (QID) | ORAL | Status: DC | PRN
  Administered 2021-12-23 – 2021-12-30 (×8): 650 mg via ORAL
  Filled 2021-12-20 (×8): qty 2

## 2021-12-20 MED ORDER — CALCIUM GLUCONATE-NACL 1-0.675 GM/50ML-% IV SOLN
1.0000 g | Freq: Once | INTRAVENOUS | Status: AC
  Administered 2021-12-20: 1000 mg via INTRAVENOUS
  Filled 2021-12-20: qty 50

## 2021-12-20 MED ORDER — HEPARIN SODIUM (PORCINE) 5000 UNIT/ML IJ SOLN
5000.0000 [IU] | Freq: Three times a day (TID) | INTRAMUSCULAR | Status: DC
  Administered 2021-12-20 – 2021-12-21 (×3): 5000 [IU] via SUBCUTANEOUS
  Filled 2021-12-20 (×3): qty 1

## 2021-12-20 MED ORDER — LACTATED RINGERS IV BOLUS (SEPSIS)
500.0000 mL | Freq: Once | INTRAVENOUS | Status: AC
  Administered 2021-12-20: 500 mL via INTRAVENOUS

## 2021-12-20 MED ORDER — ORAL CARE MOUTH RINSE
15.0000 mL | OROMUCOSAL | Status: DC
  Administered 2021-12-20 – 2021-12-23 (×30): 15 mL via OROMUCOSAL

## 2021-12-20 MED ORDER — SUCCINYLCHOLINE CHLORIDE 200 MG/10ML IV SOSY
PREFILLED_SYRINGE | INTRAVENOUS | Status: AC
  Filled 2021-12-20: qty 10

## 2021-12-20 MED ORDER — INSULIN DETEMIR 100 UNIT/ML ~~LOC~~ SOLN
0.0750 [IU]/kg | Freq: Two times a day (BID) | SUBCUTANEOUS | Status: DC
  Administered 2021-12-20: 4 [IU] via SUBCUTANEOUS
  Filled 2021-12-20 (×3): qty 0.04

## 2021-12-20 MED ORDER — ETOMIDATE 2 MG/ML IV SOLN
INTRAVENOUS | Status: AC | PRN
  Administered 2021-12-20: 20 mg via INTRAVENOUS

## 2021-12-20 MED ORDER — SODIUM CHLORIDE 0.9 % IV SOLN
500.0000 mg | INTRAVENOUS | Status: DC
  Administered 2021-12-20 – 2021-12-22 (×3): 500 mg via INTRAVENOUS
  Filled 2021-12-20 (×4): qty 5

## 2021-12-20 MED ORDER — SODIUM CHLORIDE 0.9 % IV SOLN
1.0000 g | Freq: Once | INTRAVENOUS | Status: AC
  Filled 2021-12-20: qty 10

## 2021-12-20 MED ORDER — SODIUM BICARBONATE 8.4 % IV SOLN
INTRAVENOUS | Status: DC
  Filled 2021-12-20: qty 1000
  Filled 2021-12-20: qty 150

## 2021-12-20 MED ORDER — FAMOTIDINE 20 MG PO TABS
20.0000 mg | ORAL_TABLET | Freq: Two times a day (BID) | ORAL | Status: DC
  Administered 2021-12-20: 20 mg
  Filled 2021-12-20: qty 1

## 2021-12-20 MED ORDER — POLYETHYLENE GLYCOL 3350 17 G PO PACK: 17.0000 g | PACK | Freq: Every day | ORAL | Status: DC | PRN

## 2021-12-20 MED ORDER — DOCUSATE SODIUM 100 MG PO CAPS: 100.0000 mg | ORAL_CAPSULE | Freq: Two times a day (BID) | ORAL | Status: DC | PRN

## 2021-12-20 MED ORDER — SODIUM CHLORIDE 0.9 % IV BOLUS: 1000.0000 mL | Freq: Once | INTRAVENOUS | Status: DC

## 2021-12-20 MED ORDER — INSULIN ASPART 100 UNIT/ML IJ SOLN
2.0000 [IU] | INTRAMUSCULAR | Status: DC
  Administered 2021-12-20: 4 [IU] via SUBCUTANEOUS
  Administered 2021-12-20 – 2021-12-21 (×3): 6 [IU] via SUBCUTANEOUS

## 2021-12-20 MED ORDER — BUDESONIDE 0.25 MG/2ML IN SUSP
0.2500 mg | Freq: Two times a day (BID) | RESPIRATORY_TRACT | Status: DC
  Administered 2021-12-20 – 2021-12-31 (×22): 0.25 mg via RESPIRATORY_TRACT
  Filled 2021-12-20 (×23): qty 2

## 2021-12-20 MED ORDER — INSULIN ASPART 100 UNIT/ML IJ SOLN
3.0000 [IU] | INTRAMUSCULAR | Status: DC
  Administered 2021-12-20 – 2021-12-21 (×3): 3 [IU] via SUBCUTANEOUS

## 2021-12-20 MED ORDER — SODIUM CHLORIDE 0.9 % IV SOLN
250.0000 mL | INTRAVENOUS | Status: DC
  Administered 2021-12-22 – 2021-12-24 (×2): 250 mL via INTRAVENOUS

## 2021-12-20 MED ORDER — VITAL HIGH PROTEIN PO LIQD
1000.0000 mL | ORAL | Status: DC
  Administered 2021-12-20: 1000 mL

## 2021-12-20 MED ORDER — DOCUSATE SODIUM 50 MG/5ML PO LIQD: 100.0000 mg | Freq: Two times a day (BID) | ORAL | Status: DC | PRN

## 2021-12-20 MED ORDER — PANTOPRAZOLE 2 MG/ML SUSPENSION
40.0000 mg | Freq: Every day | ORAL | Status: DC
  Administered 2021-12-20 – 2021-12-23 (×4): 40 mg
  Filled 2021-12-20 (×5): qty 20

## 2021-12-20 MED ORDER — CEFTRIAXONE SODIUM 1 G IJ SOLR
1.0000 g | Freq: Once | INTRAMUSCULAR | Status: AC
  Administered 2021-12-20: 1 g via INTRAVENOUS
  Filled 2021-12-20: qty 10

## 2021-12-20 MED ORDER — SODIUM CHLORIDE 0.9 % IV BOLUS
1000.0000 mL | Freq: Once | INTRAVENOUS | Status: AC
  Administered 2021-12-20: 1000 mL via INTRAVENOUS

## 2021-12-20 MED ORDER — HYDROCORTISONE SOD SUC (PF) 100 MG IJ SOLR
100.0000 mg | Freq: Two times a day (BID) | INTRAMUSCULAR | Status: DC
  Administered 2021-12-20 – 2021-12-21 (×2): 100 mg via INTRAVENOUS
  Filled 2021-12-20 (×2): qty 2

## 2021-12-20 MED ORDER — ARFORMOTEROL TARTRATE 15 MCG/2ML IN NEBU
15.0000 ug | INHALATION_SOLUTION | Freq: Two times a day (BID) | RESPIRATORY_TRACT | Status: DC
  Administered 2021-12-20 – 2021-12-31 (×22): 15 ug via RESPIRATORY_TRACT
  Filled 2021-12-20 (×24): qty 2

## 2021-12-20 MED ORDER — SODIUM CHLORIDE 0.9 % IV SOLN: INTRAVENOUS | Status: DC

## 2021-12-20 MED ORDER — ONDANSETRON HCL 4 MG PO TABS: 4.0000 mg | ORAL_TABLET | Freq: Four times a day (QID) | ORAL | Status: DC | PRN

## 2021-12-20 MED ORDER — PROPOFOL 1000 MG/100ML IV EMUL
0.0000 ug/kg/min | INTRAVENOUS | Status: DC
  Administered 2021-12-20: 5 ug/kg/min via INTRAVENOUS
  Administered 2021-12-21: 15 ug/kg/min via INTRAVENOUS
  Administered 2021-12-21: 25 ug/kg/min via INTRAVENOUS
  Administered 2021-12-22: 20 ug/kg/min via INTRAVENOUS
  Administered 2021-12-22: 40 ug/kg/min via INTRAVENOUS
  Administered 2021-12-23: 20 ug/kg/min via INTRAVENOUS
  Filled 2021-12-20 (×5): qty 100

## 2021-12-20 MED ORDER — FENTANYL BOLUS VIA INFUSION
25.0000 ug | INTRAVENOUS | Status: DC | PRN
  Administered 2021-12-21 (×2): 25 ug via INTRAVENOUS
  Filled 2021-12-20: qty 25

## 2021-12-20 MED ORDER — SODIUM CHLORIDE 0.9 % IV SOLN
2.0000 g | INTRAVENOUS | Status: AC
  Administered 2021-12-21 – 2021-12-24 (×4): 2 g via INTRAVENOUS
  Filled 2021-12-20 (×5): qty 20

## 2021-12-20 MED ORDER — PROSOURCE TF PO LIQD
45.0000 mL | Freq: Two times a day (BID) | ORAL | Status: DC
  Administered 2021-12-20 – 2021-12-21 (×2): 45 mL
  Filled 2021-12-20 (×2): qty 45

## 2021-12-20 MED ORDER — LACTATED RINGERS IV BOLUS (SEPSIS)
1000.0000 mL | Freq: Once | INTRAVENOUS | Status: AC
  Administered 2021-12-20: 1000 mL via INTRAVENOUS

## 2021-12-20 MED ORDER — FENTANYL 2500MCG IN NS 250ML (10MCG/ML) PREMIX INFUSION
0.0000 ug/h | INTRAVENOUS | Status: DC
  Administered 2021-12-21: 25 ug/h via INTRAVENOUS
  Filled 2021-12-20: qty 250

## 2021-12-20 NOTE — Progress Notes (Signed)
Pharmacy tech at bedside and reviewed a bag of medications that was brought up with the patient from the ER. Pts home medications sent with relative, Berenda Morale (niece). She states pt has no wife or children and lives with 2 roommates.  ?

## 2021-12-20 NOTE — Progress Notes (Signed)
RT pulled ETT back 2 cm per MD order. ETT is now at 23 at the lip. No complications noted.  ?

## 2021-12-20 NOTE — Progress Notes (Signed)
Patient intubated for airway protection, good color change on ETCO2 detector good BBS ausculted SATS 95% will continue to monitor patient. ?

## 2021-12-20 NOTE — ED Provider Notes (Signed)
?  Procedures ?Procedure Name: Intubation ?Date/Time: 12/20/2021 3:23 PM ?Performed by: Donnamarie Poag, MD ?Pre-anesthesia Checklist: Patient identified and Patient being monitored ?Oxygen Delivery Method: Simple face mask ?Preoxygenation: Pre Oxygenation  to 90% ?Induction Type: Rapid sequence ?Ventilation: Oral airway inserted - appropriate to patient size ?Laryngoscope Size: 3 and Glidescope ?Grade View: Grade I ?Endobronchial tube: Double lumen EBT ?Tube size: 7.0 mm ?Number of attempts: 1 ?Airway Equipment and Method: Oral airway ?Placement Confirmation: ETT inserted through vocal cords under direct vision ?Secured at: 23 cm ?Tube secured with: ETT holder ?Difficulty Due To: Difficulty was unanticipated ? ? ?  ?  ?Donnamarie Poag, MD ?12/20/21 1525 ? ?  ?Isla Pence, MD ?12/21/21 1509 ? ?

## 2021-12-20 NOTE — Sepsis Progress Note (Signed)
Elink is following this code sepsis ?

## 2021-12-20 NOTE — ED Notes (Signed)
Pt desat to 84% on RA & sounded very wet while breathing. Fluids held, 2l O2 via n/c applied & he rose above 90% then was brought to xray. EDP informed.    ?

## 2021-12-20 NOTE — ED Triage Notes (Signed)
Patient presents to ed via [facility] from home, states he is feeling very weak, c/o neck pain denies fall today states he has fallen in the past. Reported to have slurred speech earlier no slurred speech at present.  ?

## 2021-12-20 NOTE — H&P (Addendum)
? ?NAME:  Reginald Ayers, MRN:  [id number], DOB:  May 12, 1951, LOS: 0 ?ADMISSION DATE:  12/20/2021, CONSULTATION DATE: 12/20/2021 ?REFERRING MD: Gaylyn Cheers, ED, CHIEF COMPLAINT: Respiratory failure ? ?History of Present Illness:  ?71 year old man who presented with increasing shortness of breath and required intubation and mechanical ventilation for respiratory failure.   ? ?3-week history of increasing generalized weakness with episode of vomiting yesterday.  Short of breath this morning. ? ?Prior admissions for pneumonia. ? ?Pertinent  Medical History  ? ?Past Medical History:  ?Diagnosis Date  ? Carotid artery disease ([place])   ? L-ICA 100%, mod R-ICA dz  ? COPD (chronic obstructive pulmonary disease) ([place])   ? Coronary artery disease   ? s/p CABG February 2010 by Dr. Merilynn Finland  ? Diabetes ([place])   ? Emphysema lung ([place])   ? Hyperlipidemia   ? Hypertension   ? Peripheral arterial disease ([place])   ? post left common iliac and right SFA stenting remotely  ? Tobacco abuse   ?    ? ?Past Surgical History:  ?Procedure Laterality Date  ? ABDOMINAL AORTOGRAM N/A 04/20/2017  ? Procedure: ABDOMINAL AORTOGRAM;  Surgeon: Serafina Mitchell, MD;  Location: [facility];  Service: Cardiovascular;  Laterality: N/A;  ? ABDOMINAL AORTOGRAM W/LOWER EXTREMITY Right 12/08/2016  ? Procedure: Abdominal Aortogram w/Lower Extremity;  Surgeon: Serafina Mitchell, MD;  Location: [facility];  Service: Cardiovascular;  Laterality: Right;  ? ABDOMINAL AORTOGRAM W/LOWER EXTREMITY N/A 09/07/2017  ? Procedure: ABDOMINAL AORTOGRAM W/LOWER EXTREMITY;  Surgeon: Serafina Mitchell, MD;  Location: [facility];  Service: Cardiovascular;  Laterality: N/A;  ? CARDIAC CATHETERIZATION  2010  ? CAROTID ANGIOGRAM  2014  ? CORONARY ARTERY BYPASS GRAFT  2010  ? LIMA-LAD, Lrad-OM1, SVG-RI, SVG-AM-dRCA  ? DOPPLER ECHOCARDIOGRAPHY  2010  ? LOWER EXTREMITY ANGIOGRAM  11/2006  ? left common femoral endarterectomy and patch angioplasty:The mid  right SFA was angioplastied with a 4 x 8  ? LOWER EXTREMITY ANGIOGRAM  2003  ? left common iliac artery stenting by Dr. Adora Fridge  ? LOWER EXTREMITY ANGIOGRAPHY Right 04/20/2017  ? Procedure: Lower Extremity Angiography;  Surgeon: Serafina Mitchell, MD;  Location: [facility];  Service: Cardiovascular;  Laterality: Right;  ? NM MYOVIEW LTD  2015  ? PERIPHERAL VASCULAR ATHERECTOMY Right 12/08/2016  ? Procedure: Peripheral Vascular Atherectomy;  Surgeon: Serafina Mitchell, MD;  Location: [facility];  Service: Cardiovascular;  Laterality: Right;  ? PERIPHERAL VASCULAR BALLOON ANGIOPLASTY Right 12/08/2016  ? Procedure: Peripheral Vascular Balloon Angioplasty;  Surgeon: Serafina Mitchell, MD;  Location: [facility];  Service: Cardiovascular;  Laterality: Right;  ? PERIPHERAL VASCULAR BALLOON ANGIOPLASTY Right 04/20/2017  ? Procedure: PERIPHERAL VASCULAR BALLOON ANGIOPLASTY;  Surgeon: Serafina Mitchell, MD;  Location: [facility];  Service: Cardiovascular;  Laterality: Right;  SFA  ? PERIPHERAL VASCULAR CATHETERIZATION N/A 09/22/2016  ? Procedure: Abdominal Aortogram w/ bilateral Lower Extremity [place];  Surgeon: Serafina Mitchell, MD;  Location: [facility];  Service: Cardiovascular;  Laterality: N/A;  ? PERIPHERAL VASCULAR CATHETERIZATION Right 09/22/2016  ? Procedure: Peripheral Vascular Intervention;  Surgeon: Serafina Mitchell, MD;  Location: [facility];  Service: Cardiovascular;  Laterality: Right;  ? ? ? ?Significant Hospital Events: ?Including procedures, antibiotic start and stop dates in addition to other pertinent events   ?4/29 admitted via ED ? ?Interim History / Subjective:  ?Received sepsis bundle with fluid resuscitation early  antibiotics.  Required intubation. ? ?Objective   ?Blood pressure (!) 111/45, pulse (!) 107, temperature 98 ?F (36.7 ?C), resp. rate (!) 32, height 5\' 3"  (1.6 m), weight 48.5 kg, SpO2 97 %. ?   ?Vent Mode: PRVC ?FiO2 (%):  [50 %] 50 % ?Set Rate:  [20 bmp]  20 bmp ?Vt Set:  [500 mL] 500 mL ?PEEP:  [5 cmH20] 5 cmH20 ?Plateau Pressure:  [29 cmH20] 29 cmH20  ?No intake or output data in the 24 hours ending 12/20/21 1640 ?Filed Weights  ? 12/20/21 1055  ?Weight: 48.5 kg  ? ? ?Examination: ?General: Frail cachectic appearing elderly man intubated. ?HENT: Orally intubated.  Dry mucous membranes.  OG tube in place draining gastric contents. ?Lungs: No wheezes or rhonchi.  Bronchial breath sounds at right base. ?Cardiovascular: No JVD.  No peripheral edema.  Heart sounds unremarkable.  Trophic arterial changes in both lower extremities. ?Abdomen: Scaphoid soft and nontender. ?Extremities: Venous stasis changes on both lower extremities. ?Neuro: Sedated.  Moves all extremities. ?GU: No Foley in place. ? ?Resolved Hospital Problem list   ?N/A ? ?Assessment & Plan:  ?Acute respiratory failure with hypoxia (HCC) ?- Full lung protective ventilation. Correct acidosis, maintain Pplat <30.  ?-Follow-up postintubation ABG and adjust FiO2 PEEP accordingly. ? ?Sepsis due to pneumonia [facility]) ?Due to Community-acquired pneumonia. Lactate negative and no hypotension. PIRO score predicts moderate to high 28-day mortality. ? ?- Ceftriaxone and azithromycin ?- Steroids for pneumonia for 5 days. ?- Complete initial 58ml/kg fluid bolus, follow lactate, no need for pressors at this time.  ?-Should start to show signs of improvement within the next 24 hours.  Otherwise, goals of care may need to be reset. ? ?Diabetes ([place]) ?Type 2 DM with hyperglycemia. On orals at home.  ? ?- Basal and sensitive sliding scale.  ? ?COPD (chronic obstructive pulmonary disease) ([place]) ?Emphysematous change on CT but no recorded PFT's  ? ?- Start inhaled bronchodilators and steroids.  ? ?Hx of CABG Feb 2010 ?- Continue ASA statin ? ?AKI (acute kidney injury) ([place]) ?Creatinine 2.19, baseline 1.42 ? ?- Fluid resuscitation ?- Follow creatinine ? ?Severe protein-energy malnutrition ([place]) ?Appears cachectic with decrease  muscle mass ? ?- Initiate tube feeds.  ? ?Acidosis, metabolic ?pH 7.0.  Appears to be predominantly mixed anion gap and none anion gap acidosis likely from renal failure with inadequate respiratory compensation prior to intubation. ? ?-Repeat ABG post admission, repeat BMP this evening.  Administer bicarbonate to correct if necessary, but should improve with mechanical ventilation and fluid resuscitation ? ? ?Best Practice (right click and "Reselect all SmartList Selections" daily)  ? ?Diet/type: tubefeeds ?DVT prophylaxis: prophylactic heparin  ?GI prophylaxis: H2B ?Lines: N/A ?Foley:  Yes, and it is still needed ?Code Status:  full code patient has MOLST form scanned in under ACP section ?Last date of multidisciplinary goals of care discussion [patient's niece updated at the bedside.] ? ?Labs   ?CBC: ?Recent Labs  ?Lab 12/20/21 ?1116 12/20/21 ?1127 12/20/21 ?1533  ?WBC 18.8*  --   --   ?NEUTROABS 16.6*  --   --   ?HGB 10.4* 10.9* 10.9*  ?HCT 33.5* 32.0* 32.0*  ?MCV 95.7  --   --   ?PLT 284  --   --   ? ? ?Basic Metabolic Panel: ?Recent Labs  ?Lab 12/20/21 ?1116 12/20/21 ?1127 12/20/21 ?1533  ?NA 136 138 139  ?K 3.9 3.8 4.1  ?CL 113*  --   --   ?CO2 11*  --   --   ?  GLUCOSE 236*  --   --   ?BUN 42*  --   --   ?CREATININE 2.19*  --   --   ?CALCIUM 8.5*  --   --   ? ?GFR: ?Estimated Creatinine Clearance: 20.9 mL/min (A) (by C-G formula based on SCr of 2.19 mg/dL (H)). ?Recent Labs  ?Lab 12/20/21 ?1116  ?WBC 18.8*  ?LATICACIDVEN 1.7  ? ? ?Liver Function Tests: ?Recent Labs  ?Lab 12/20/21 ?1116  ?AST 18  ?ALT 14  ?ALKPHOS 56  ?BILITOT 0.6  ?PROT 6.6  ?ALBUMIN 3.3*  ? ?No results for input(s): LIPASE, AMYLASE in the last 168 hours. ?No results for input(s): AMMONIA in the last 168 hours. ? ?ABG ?   ?Component Value Date/Time  ? PHART 7.033 (LL) 12/20/2021 1533  ? PCO2ART 40.5 12/20/2021 1533  ? PO2ART 74 (L) 12/20/2021 1533  ? HCO3 10.8 (L) 12/20/2021 1533  ? TCO2 12 (L) 12/20/2021 1533  ? ACIDBASEDEF 19.0 (H) 12/20/2021  1533  ? O2SAT 86 12/20/2021 1533  ?  ? ?Coagulation Profile: ?No results for input(s): INR, PROTIME in the last 168 hours. ? ?Cardiac Enzymes: ?No results for input(s): CKTOTAL, CKMB, CKMBINDEX, TROPONI

## 2021-12-20 NOTE — TOC Initial Note (Signed)
Transition of Care (TOC) - Initial/Assessment Note  ? ? ?Patient Details  ?Name: Reginald Ayers ?MRN: [id number] ?Date of Birth: 03/01/1951 ? ?Transition of Care (TOC) CM/SW Contact:    ?Verdell Carmine, RN ?Phone Number: ?12/20/2021, 4:54 PM ? ?Clinical Narrative:                 ? ?Patient presented with SHOB, cough. History of CABG, CAD, recently seen in vascular office (Dr Trula Slade) for worsening claudication, needs aortogram of right leg ( previous stent placed). Now with pneumonia, intubated. Patient cachectic, will need consults for dietary, PT OT when improving. May need SNF post hospitalization.  Patient admitted to 2 M.  ? ?TOC will follow for needs, recommendations, and transitions ? ?Expected Discharge Plan:  (SNF vs HH) ?Barriers to Discharge: Continued Medical Work up ? ? ?Patient Goals and CMS Choice ?  ?  ?  ? ?Expected Discharge Plan and Services ?Expected Discharge Plan:  (SNF vs HH) ?  ?  ?  ?Living arrangements for the past 2 months: [place] ?                ?  ?  ?  ?  ?  ?  ?  ?  ?  ?  ? ?Prior Living Arrangements/Services ?Living arrangements for the past 2 months: [place] ?  ?Patient language and need for interpreter reviewed:: Yes ?       ?Need for Family Participation in Patient Care: Yes (Comment) ?Care giver support system in place?: Yes (comment) ?  ?Criminal Activity/Legal Involvement Pertinent to Current Situation/Hospitalization: No - Comment as needed ? ?Activities of Daily Living ?  ?  ? ?Permission Sought/Granted ?  ?  ?   ?   ?   ?   ? ?Emotional Assessment ?Appearance:: Appears stated age ?Attitude/Demeanor/Rapport: Unable to Assess, Intubated (Following Commands or Not Following Commands) ?  ?  ?Alcohol / Substance Use: Tobacco Use ?Psych Involvement: No (comment) ? ?Admission diagnosis:  Sepsis due to pneumonia ([place]) [J18.9, A41.9] ?Pneumonia [J18.9] ?Patient Active Problem List  ? Diagnosis Date Noted  ? Acute respiratory failure with hypoxia ([place])  12/20/2021  ? Severe protein-energy malnutrition ([place]) 12/20/2021  ? Acidosis, metabolic 0000000  ? AKI (acute kidney injury) ([place]) 09/27/2021  ? Stage 2 chronic kidney disease 08/14/2019  ? Sepsis due to pneumonia ([place]) 08/12/2017  ? GERD (gastroesophageal reflux disease) 07/30/2015  ? Tobacco abuse 12/06/2014  ? COPD (chronic obstructive pulmonary disease) ([place])   ? Hx of CABG Feb 2010 08/21/2013  ? Peripheral arterial disease ([place]) 08/21/2013  ? Carotid artery disease ([place]) 08/21/2013  ? Diabetes ([place]) 08/21/2013  ? Essential hypertension 08/21/2013  ? Hyperlipidemia 08/21/2013  ? ?PCP:  Seward Carol, MD ?Pharmacy:   ?[facility] [address] ?[place] [address] ?Phone: [phone] Fax: [phone] ? ? ? ? ?Social Determinants of Health (SDOH) Interventions ?  ? ?Readmission Risk Interventions ?   ? View : No data to display.  ?  ?  ?  ? ? ? ?

## 2021-12-20 NOTE — Progress Notes (Signed)
eLink Physician-Brief Progress Note ?Patient Name: Reginald Ayers ?DOB: 09-14-1950 ?MRN: [id number] ? ? ?Date of Service ? 12/20/2021  ?HPI/Events of Note ? Significant acidemia on abg ?7.09/32/90/11.  AG 13 on bmp 1830 ?Creat 2.16, lactate 2.7  ?eICU Interventions ? Lactate now ?Start bicarb drip ?Repeat labs in 3 hrs  ? ? ? ?Intervention Category ?Major Interventions: Acid-Base disturbance - evaluation and management ? ?Henry Russel, P ?12/20/2021, 9:18 PM ?

## 2021-12-20 NOTE — Assessment & Plan Note (Signed)
Type 2 DM with hyperglycemia. On orals at home.  ? ?- Basal and sensitive sliding scale.  ?

## 2021-12-20 NOTE — Assessment & Plan Note (Signed)
Continue ASA/statin  °

## 2021-12-20 NOTE — Assessment & Plan Note (Signed)
Appears cachectic with decrease muscle mass ? ?- Initiate tube feeds.  ?

## 2021-12-20 NOTE — Assessment & Plan Note (Signed)
pH 7.0.  Appears to be predominantly mixed anion gap and none anion gap acidosis likely from renal failure with inadequate respiratory compensation prior to intubation. ? ?-Repeat ABG post admission, repeat BMP this evening.  Administer bicarbonate to correct if necessary, but should improve with mechanical ventilation and fluid resuscitation ?

## 2021-12-20 NOTE — Assessment & Plan Note (Addendum)
-   Full lung protective ventilation. Correct acidosis, maintain Pplat <30.  ?-Follow-up postintubation ABG and adjust FiO2 PEEP accordingly. ?

## 2021-12-20 NOTE — Progress Notes (Signed)
An USGPIV (ultrasound guided PIV) has been placed for short-term vasopressor infusion. A correctly placed ivWatch must be used when administering Vasopressors. Should this treatment be needed beyond 72 hours, central line access should be obtained.  It will be the responsibility of the bedside nurse to follow best practice to prevent extravasations.   ?

## 2021-12-20 NOTE — Progress Notes (Signed)
ANES FROSS [id number] ?Admission Data: 12/20/2021 7:21 PM ?Attending Provider: Kipp Brood, MD  ?[id number], Jori Moll, MD ?Consults/ Treatment Team:  ? ?Reginald Ayers is a 71 y.o. male patient admitted from ED awake, alert  & orientated  X 3,  Full Code, VSS - Blood pressure (!) 109/58, pulse (!) 102, temperature 98 ?F (36.7 ?C), resp. rate (!) 34, height 5\' 3"  (1.6 m), weight 48.5 kg, SpO2 97 %., O2  vented  ?Tele # [id number] placed and pt is currently running:normal sinus rhythm. ? ?Patient admitted to the unit at in [id number], Will cont to monitor and assist as needed. ? ?Hosie Spangle, RN ?12/20/2021 7:21 PM  ?

## 2021-12-20 NOTE — ED Notes (Signed)
Patient transported to X-ray 

## 2021-12-20 NOTE — Assessment & Plan Note (Addendum)
Due to Community-acquired pneumonia. Lactate negative and no hypotension. PIRO score predicts moderate to high 28-day mortality. ? ?- Ceftriaxone and azithromycin ?- Steroids for pneumonia for 5 days. ?- Complete initial 22ml/kg fluid bolus, follow lactate, no need for pressors at this time.  ?-Should start to show signs of improvement within the next 24 hours.  Otherwise, goals of care may need to be reset. ?

## 2021-12-20 NOTE — ED Provider Notes (Signed)
?[facility] ?Provider Note ? ? ?CSN: [id number] ?Arrival date & time: 12/20/21  1019 ? ?  ? ?History ? ?Chief Complaint  ?Patient presents with  ? Stroke Symptoms  ? ? ?Reginald Ayers is a 71 y.o. male. ? ?Pt is a 71 yo male with a pmhx significant for cad, peripheral arterial disease, carotid artery disease, DM, htn, hyperlipidemia, copd, and tobacco abuse.  He lives at home with 2 roommates who are not family members.  Pt saw Dr. Trula Slade on 4/24 for chronic wounds to his right leg.  He was concerned about recurrent stenosis with his right femoral artery stent and recommended angiography.  Pt was called yesterday to schedule and told the nurse that he has been weak for 3 weeks with difficulty walking, cough, and dizziness.  She told pt to call pcp or go to the ED.  Pt said he would do so and then contact vascular to schedule his aortogram when he felt better.  This morning, pt's room mate called EMS because pt was very weak.  Pt had a little slurred speech which is better.  He has some dried emesis to his face from vomiting in the night.  Pt has some neck pain and some sob.  He feels diffusely weak.   ? ? ?  ? ?Home Medications ?Prior to Admission medications   ?Medication Sig Start Date End Date Taking? Authorizing Provider  ?albuterol (VENTOLIN HFA) 108 (90 Base) MCG/ACT inhaler Inhale 2 puffs into the lungs every 4 (four) hours as needed. 01/18/20   [provider]  ?atorvastatin (LIPITOR) 40 MG tablet Take 1 tablet (40 mg total) by mouth daily. 07/30/21 10/28/21  Lorretta Harp, MD  ?B Complex Vitamins (B COMPLEX PO) Take 1 tablet by mouth 2 (two) times a week.  ?Patient not taking: Reported on 09/28/2021    [provider]  ?clopidogrel (PLAVIX) 75 MG tablet Take 75 mg by mouth daily.  07/24/13   [provider]  ?Cyanocobalamin (VITAMIN B 12 PO) Take 1 capsule by mouth daily.    [provider]  ?empagliflozin (JARDIANCE) 25 MG TABS tablet  Take 25 mg by mouth daily.    [provider]  ?furosemide (LASIX) 40 MG tablet Take 40 mg by mouth daily as needed for edema.    [provider]  ?Homeopathic Products (LEG CRAMP RELIEF SL) Place 2 tablets under the tongue at bedtime as needed (leg cramps).  ?Patient not taking: Reported on 09/28/2021    [provider]  ?ibuprofen (ADVIL,MOTRIN) 200 MG tablet Take 800 mg by mouth every 8 (eight) hours as needed for moderate pain.    [provider]  ?IRON PO Take 1 tablet by mouth 4 (four) times a week.     [provider]  ?losartan (COZAAR) 50 MG tablet Take 50 mg by mouth daily. 07/10/21   [provider]  ?metFORMIN (GLUCOPHAGE) 500 MG tablet Take 1,000 mg by mouth 2 (two) times daily with a meal.  07/24/13   [provider]  ?metoprolol succinate (TOPROL-XL) 25 MG 24 hr tablet TAKE 1 TABLET(25 MG) BY MOUTH DAILY ?Patient taking differently: Take 25 mg by mouth daily. 07/29/20   Lorretta Harp, MD  ?NITROSTAT 0.4 MG SL tablet Take 0.4 mg by mouth every 5 (five) minutes as needed for chest pain.  12/18/14   [provider]  ?Polyvinyl Alcohol-Povidone (MURINE TEARS FOR DRY EYES OP) Apply 1 drop to eye 3 (three)  times daily as needed (dry eyes).    [provider]  ?potassium chloride (K-DUR) 10 MEQ tablet Take 10 mEq by mouth daily.    [provider]  ?Pseudoeph-Doxylamine-DM-APAP (NYQUIL PO) Take 1 Dose by mouth at bedtime.     [provider]  ?simvastatin (ZOCOR) 40 MG tablet Take 40 mg by mouth daily.    [provider]  ?SPIRIVA HANDIHALER 18 MCG inhalation capsule Place 18 mcg into inhaler and inhale daily at 8 pm.  08/16/13   [provider]  ?traMADol (ULTRAM) 50 MG tablet Take 50 mg by mouth 3 (three) times daily as needed for severe pain.  08/16/13   [provider]  ?   ? ?Allergies    ?Actos [pioglitazone], Atorvastatin, and Lisinopril   ? ?Review of Systems   ?Review of  Systems  ?Respiratory:  Positive for cough and shortness of breath.   ?Gastrointestinal:  Positive for nausea and vomiting.  ?Neurological:  Positive for weakness.  ?All other systems reviewed and are negative. ? ?Physical Exam ?Updated Vital Signs ?BP 127/62   Pulse (!) 105   Temp 98 ?F (36.7 ?C)   Resp (!) 31   Ht 5\' 3"  (1.6 m)   Wt 48.5 kg   SpO2 90%   BMI 18.95 kg/m?  ?Physical Exam ?Vitals and nursing note reviewed.  ?Constitutional:   ?   Appearance: Normal appearance. He is ill-appearing.  ?HENT:  ?   Head: Normocephalic and atraumatic.  ?   Right Ear: External ear normal.  ?   Left Ear: External ear normal.  ?   Nose: Nose normal.  ?   Mouth/Throat:  ?   Mouth: Mucous membranes are dry.  ?Eyes:  ?   Extraocular Movements: Extraocular movements intact.  ?   Conjunctiva/sclera: Conjunctivae normal.  ?   Pupils: Pupils are equal, round, and reactive to light.  ?Cardiovascular:  ?   Rate and Rhythm: Normal rate and regular rhythm.  ?   Pulses: Normal pulses.  ?   Heart sounds: Normal heart sounds.  ?Pulmonary:  ?   Breath sounds: Rhonchi and rales present.  ?Abdominal:  ?   General: Abdomen is flat. Bowel sounds are normal.  ?   Palpations: Abdomen is soft.  ?Musculoskeletal:     ?   General: Normal range of motion.  ?   Cervical back: Normal range of motion and neck supple.  ?Skin: ?   General: Skin is warm.  ?   Capillary Refill: Capillary refill takes less than 2 seconds.  ?Neurological:  ?   General: No focal deficit present.  ?   Mental Status: He is alert and oriented to person, place, and time.  ?   Comments: Diffusely weak  ?Psychiatric:     ?   Mood and Affect: Mood normal.     ?   Behavior: Behavior normal.  ? ? ?ED Results / Procedures / Treatments   ?Labs ?(all labs ordered are listed, but only abnormal results are displayed) ?Labs Reviewed  ?CBC WITH DIFFERENTIAL/PLATELET - Abnormal; Notable for the following components:  ?    Result Value  ? WBC 18.8 (*)   ? RBC 3.50 (*)   ? Hemoglobin 10.4  (*)   ? HCT 33.5 (*)   ? Neutro Abs 16.6 (*)   ? Monocytes Absolute 1.3 (*)   ? Abs Immature Granulocytes 0.23 (*)   ? All other components within normal limits  ?COMPREHENSIVE METABOLIC PANEL - Abnormal;  Notable for the following components:  ? Chloride 113 (*)   ? CO2 11 (*)   ? Glucose, Bld 236 (*)   ? BUN 42 (*)   ? Creatinine, Ser 2.19 (*)   ? Calcium 8.5 (*)   ? Albumin 3.3 (*)   ? GFR, Estimated 31 (*)   ? All other components within normal limits  ?URINALYSIS, ROUTINE W REFLEX MICROSCOPIC - Abnormal; Notable for the following components:  ? Glucose, UA >=500 (*)   ? Hgb urine dipstick SMALL (*)   ? Protein, ur 30 (*)   ? All other components within normal limits  ?BETA-HYDROXYBUTYRIC ACID - Abnormal; Notable for the following components:  ? Beta-Hydroxybutyric Acid 0.62 (*)   ? All other components within normal limits  ?URINALYSIS, MICROSCOPIC (REFLEX) - Abnormal; Notable for the following components:  ? Bacteria, UA MANY (*)   ? All other components within normal limits  ?CBG MONITORING, ED - Abnormal; Notable for the following components:  ? Glucose-Capillary 238 (*)   ? All other components within normal limits  ?I-STAT VENOUS BLOOD GAS, ED - Abnormal; Notable for the following components:  ? pH, Ven 7.208 (*)   ? pCO2, Ven 26.8 (*)   ? pO2, Ven 29 (*)   ? Bicarbonate 10.7 (*)   ? TCO2 11 (*)   ? Acid-base deficit 16.0 (*)   ? Calcium, Ion 1.13 (*)   ? HCT 32.0 (*)   ? Hemoglobin 10.9 (*)   ? All other components within normal limits  ?RESP PANEL BY RT-PCR (FLU A&B, COVID) [id number]  ?CULTURE, BLOOD (ROUTINE X 2)  ?CULTURE, BLOOD (ROUTINE X 2)  ?LACTIC ACID, PLASMA  ? ? ?EKG ?EKG Interpretation ? ?Date/Time:  Saturday December 20 2021 10:35:43 EDT ?Ventricular Rate:  93 ?PR Interval:  174 ?QRS Duration: 106 ?QT Interval:  368 ?QTC Calculation: 457 ?R Axis:   89 ?Text Interpretation: Normal sinus rhythm Septal infarct , age undetermined Abnormal ECG When compared with ECG of 27-Sep-2021 11:11, PREVIOUS ECG IS  PRESENT No significant change since last tracing Confirmed by Isla Pence [phone]) on 12/20/2021 11:06:47 AM ? ?Radiology ?CT ABDOMEN PELVIS WO CONTRAST ? ?Result Date: 12/20/2021 ?CLINICAL DATA:  Abdominal pain,

## 2021-12-20 NOTE — Sepsis Progress Note (Signed)
Lactic Acid draw due at 1603. Was not done when pt still in ED. Pt tx to 9M. Spoke with nurse who will notify the lab. Thank you. ?

## 2021-12-20 NOTE — Assessment & Plan Note (Signed)
Creatinine 2.19, baseline 1.42 ? ?- Fluid resuscitation ?- Follow creatinine ?

## 2021-12-20 NOTE — Assessment & Plan Note (Signed)
Emphysematous change on CT but no recorded PFT's  ? ?- Start inhaled bronchodilators and steroids.  ?

## 2021-12-21 ENCOUNTER — Inpatient Hospital Stay (HOSPITAL_COMMUNITY): Payer: Medicare Other

## 2021-12-21 DIAGNOSIS — J449 Chronic obstructive pulmonary disease, unspecified: Secondary | ICD-10-CM

## 2021-12-21 DIAGNOSIS — L8990 Pressure ulcer of unspecified site, unspecified stage: Secondary | ICD-10-CM | POA: Insufficient documentation

## 2021-12-21 DIAGNOSIS — I639 Cerebral infarction, unspecified: Secondary | ICD-10-CM

## 2021-12-21 DIAGNOSIS — I10 Essential (primary) hypertension: Secondary | ICD-10-CM | POA: Diagnosis not present

## 2021-12-21 DIAGNOSIS — E1169 Type 2 diabetes mellitus with other specified complication: Secondary | ICD-10-CM

## 2021-12-21 DIAGNOSIS — Z951 Presence of aortocoronary bypass graft: Secondary | ICD-10-CM

## 2021-12-21 DIAGNOSIS — N179 Acute kidney failure, unspecified: Secondary | ICD-10-CM | POA: Diagnosis not present

## 2021-12-21 DIAGNOSIS — A419 Sepsis, unspecified organism: Secondary | ICD-10-CM | POA: Diagnosis not present

## 2021-12-21 DIAGNOSIS — J9601 Acute respiratory failure with hypoxia: Secondary | ICD-10-CM | POA: Diagnosis not present

## 2021-12-21 DIAGNOSIS — J189 Pneumonia, unspecified organism: Secondary | ICD-10-CM

## 2021-12-21 LAB — LIPID PANEL
Cholesterol: 85 mg/dL (ref 0–200)
HDL: 32 mg/dL — ABNORMAL LOW (ref >40)
LDL Cholesterol: 21 mg/dL (ref 0–99)
Total CHOL/HDL Ratio: 2.7 RATIO
Triglycerides: 160 mg/dL — ABNORMAL HIGH (ref <150)
VLDL: 32 mg/dL (ref 0–40)

## 2021-12-21 LAB — POCT I-STAT 7, (LYTES, BLD GAS, ICA,H+H)
Acid-base deficit: 16 mmol/L — ABNORMAL HIGH (ref 0.0–2.0)
Acid-base deficit: 7 mmol/L — ABNORMAL HIGH (ref 0.0–2.0)
Acid-base deficit: 4 mmol/L — ABNORMAL HIGH (ref 0.0–2.0)
Bicarbonate: 14 mmol/L — ABNORMAL LOW (ref 20.0–28.0)
Bicarbonate: 18.9 mmol/L — ABNORMAL LOW (ref 20.0–28.0)
Bicarbonate: 21 mmol/L (ref 20.0–28.0)
Calcium, Ion: 1.31 mmol/L (ref 1.15–1.40)
Calcium, Ion: 1.21 mmol/L (ref 1.15–1.40)
Calcium, Ion: 1.18 mmol/L (ref 1.15–1.40)
HCT: 33 % — ABNORMAL LOW (ref 39.0–52.0)
HCT: 29 % — ABNORMAL LOW (ref 39.0–52.0)
HCT: 26 % — ABNORMAL LOW (ref 39.0–52.0)
Hemoglobin: 11.2 g/dL — ABNORMAL LOW (ref 13.0–17.0)
Hemoglobin: 9.9 g/dL — ABNORMAL LOW (ref 13.0–17.0)
Hemoglobin: 8.8 g/dL — ABNORMAL LOW (ref 13.0–17.0)
O2 Saturation: 27 %
O2 Saturation: 97 %
O2 Saturation: 95 %
Patient temperature: 36.5
Patient temperature: 37.3
Potassium: 3 mmol/L — ABNORMAL LOW (ref 3.5–5.1)
Potassium: 2.4 mmol/L — CL (ref 3.5–5.1)
Potassium: 2.9 mmol/L — ABNORMAL LOW (ref 3.5–5.1)
Sodium: 140 mmol/L (ref 135–145)
Sodium: 143 mmol/L (ref 135–145)
Sodium: 141 mmol/L (ref 135–145)
TCO2: 15 mmol/L — ABNORMAL LOW (ref 22–32)
TCO2: 20 mmol/L — ABNORMAL LOW (ref 22–32)
TCO2: 22 mmol/L (ref 22–32)
pCO2 arterial: 47 mmHg (ref 32–48)
pCO2 arterial: 38.8 mmHg (ref 32–48)
pCO2 arterial: 36.8 mmHg (ref 32–48)
pH, Arterial: 7.079 — CL (ref 7.35–7.45)
pH, Arterial: 7.297 — ABNORMAL LOW (ref 7.35–7.45)
pH, Arterial: 7.365 (ref 7.35–7.45)
pO2, Arterial: 25 mmHg — CL (ref 83–108)
pO2, Arterial: 97 mmHg (ref 83–108)
pO2, Arterial: 79 mmHg — ABNORMAL LOW (ref 83–108)

## 2021-12-21 LAB — MAGNESIUM: Magnesium: 1.7 mg/dL (ref 1.7–2.4)

## 2021-12-21 LAB — VOLATILES,BLD-ACETONE,ETHANOL,ISOPROP,METHANOL
Acetone, blood: <0.01 g/dL (ref 0.000–0.010)
Ethanol, blood: <0.01 g/dL (ref 0.000–0.010)
Isopropanol, blood: <0.01 g/dL (ref 0.000–0.010)
Methanol, blood: <0.01 g/dL (ref 0.000–0.010)

## 2021-12-21 LAB — CBC
HCT: 32.1 % — ABNORMAL LOW (ref 39.0–52.0)
Hemoglobin: 9.9 g/dL — ABNORMAL LOW (ref 13.0–17.0)
MCH: 30 pg (ref 26.0–34.0)
MCHC: 30.8 g/dL (ref 30.0–36.0)
MCV: 97.3 fL (ref 80.0–100.0)
Platelets: 244 10*3/uL (ref 150–400)
RBC: 3.3 MIL/uL — ABNORMAL LOW (ref 4.22–5.81)
RDW: 15.2 % (ref 11.5–15.5)
WBC: 14.2 10*3/uL — ABNORMAL HIGH (ref 4.0–10.5)
nRBC: 0 % (ref 0.0–0.2)

## 2021-12-21 LAB — BASIC METABOLIC PANEL
Anion gap: 12 (ref 5–15)
Anion gap: 13 (ref 5–15)
BUN: 45 mg/dL — ABNORMAL HIGH (ref 8–23)
BUN: 48 mg/dL — ABNORMAL HIGH (ref 8–23)
CO2: 9 mmol/L — ABNORMAL LOW (ref 22–32)
CO2: 14 mmol/L — ABNORMAL LOW (ref 22–32)
Calcium: 8.4 mg/dL — ABNORMAL LOW (ref 8.9–10.3)
Calcium: 8 mg/dL — ABNORMAL LOW (ref 8.9–10.3)
Chloride: 117 mmol/L — ABNORMAL HIGH (ref 98–111)
Chloride: 113 mmol/L — ABNORMAL HIGH (ref 98–111)
Creatinine, Ser: 2.13 mg/dL — ABNORMAL HIGH (ref 0.61–1.24)
Creatinine, Ser: 2.03 mg/dL — ABNORMAL HIGH (ref 0.61–1.24)
GFR, Estimated: 32 mL/min — ABNORMAL LOW (ref >60)
GFR, Estimated: 34 mL/min — ABNORMAL LOW (ref >60)
Glucose, Bld: 181 mg/dL — ABNORMAL HIGH (ref 70–99)
Glucose, Bld: 259 mg/dL — ABNORMAL HIGH (ref 70–99)
Potassium: 3.6 mmol/L (ref 3.5–5.1)
Potassium: 2.8 mmol/L — ABNORMAL LOW (ref 3.5–5.1)
Sodium: 138 mmol/L (ref 135–145)
Sodium: 140 mmol/L (ref 135–145)

## 2021-12-21 LAB — GLUCOSE, CAPILLARY
Glucose-Capillary: 218 mg/dL — ABNORMAL HIGH (ref 70–99)
Glucose-Capillary: 219 mg/dL — ABNORMAL HIGH (ref 70–99)
Glucose-Capillary: 262 mg/dL — ABNORMAL HIGH (ref 70–99)
Glucose-Capillary: 296 mg/dL — ABNORMAL HIGH (ref 70–99)
Glucose-Capillary: 300 mg/dL — ABNORMAL HIGH (ref 70–99)
Glucose-Capillary: 336 mg/dL — ABNORMAL HIGH (ref 70–99)

## 2021-12-21 LAB — PHOSPHORUS: Phosphorus: 3.1 mg/dL (ref 2.5–4.6)

## 2021-12-21 LAB — TROPONIN I (HIGH SENSITIVITY)
Troponin I (High Sensitivity): 881 ng/L (ref <18)
Troponin I (High Sensitivity): 736 ng/L (ref <18)

## 2021-12-21 LAB — HEMOGLOBIN A1C
Hgb A1c MFr Bld: 8.1 % — ABNORMAL HIGH (ref 4.8–5.6)
Mean Plasma Glucose: 185.77 mg/dL

## 2021-12-21 LAB — LACTIC ACID, PLASMA: Lactic Acid, Venous: 2.8 mmol/L (ref 0.5–1.9)

## 2021-12-21 LAB — TRIGLYCERIDES: Triglycerides: 110 mg/dL (ref <150)

## 2021-12-21 IMAGING — DX DG CHEST 1V PORT
1 series · 1 of 1 positions shown · non-contrast
Comparison: [DATE].

CLINICAL DATA: Pneumonia.

EXAM:
PORTABLE CHEST 1 VIEW

[chest]
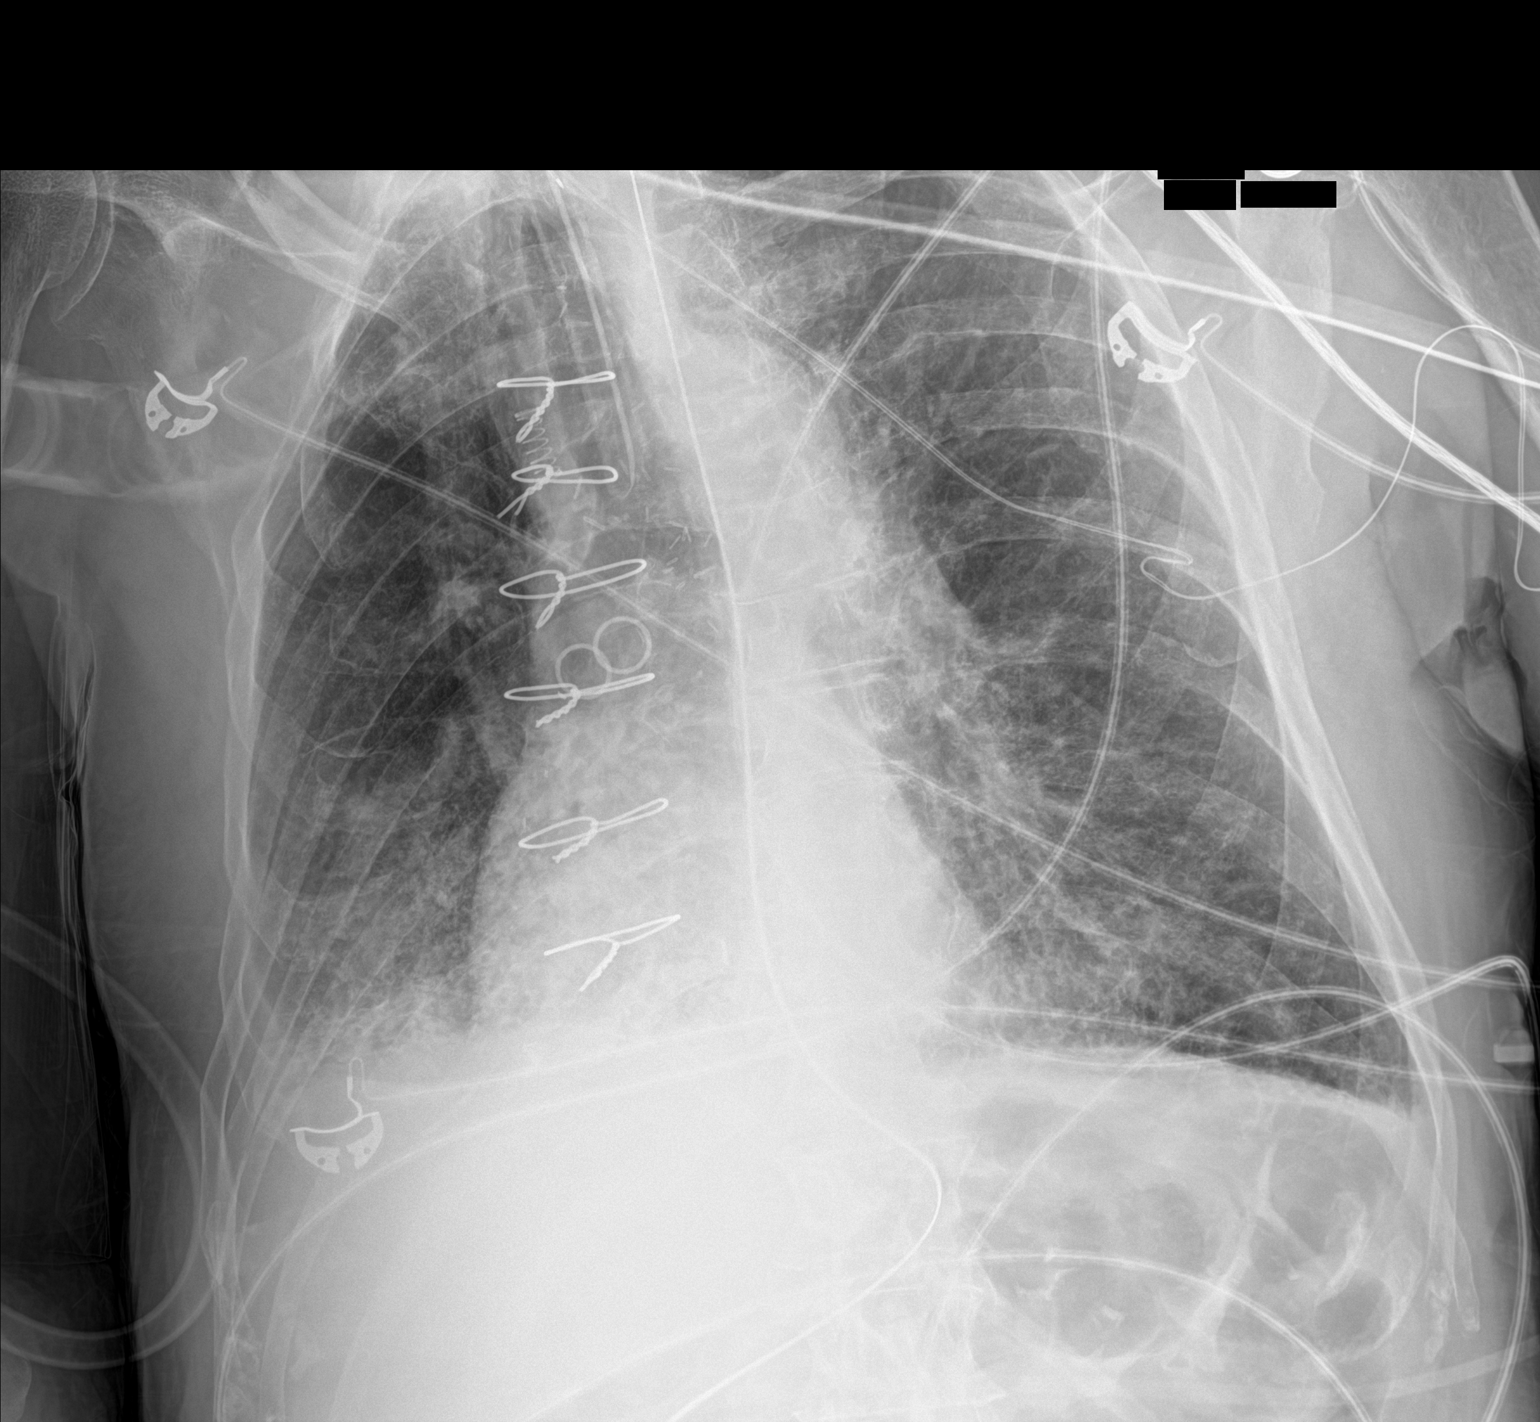

[1 of 1 positions shown; findings below may reference images not displayed]

FINDINGS: Endotracheal tube now proximally 1.6 cm from the carina.

Gastric tube coursing through in off the field of the radiograph.
Side port off field of view, tip likely beyond gastric antrum. Signs
of median sternotomy and CABG as before.

Image rotated to the RIGHT with EKG leads projecting over the
patient's chest. Accounting for rotation cardiomediastinal contours
and hilar structures are stable.

Persistent basilar airspace disease now with coarse interstitial
markings also at the LEFT lung base or more prominent due to
rotation in this patient with bilateral airspace disease worse on
the RIGHT.

No gross effusion.

On limited assessment there is no acute skeletal process.
IMPRESSION: 1. Endotracheal tube now 1.6 cm from the carina. Consider slight
retraction as warranted approximately 1.5 - 2 cm for more optimal
placement
2. Signs of basilar pneumonia worse on the RIGHT.

These results will be called to the ordering clinician or
representative by the Radiologist Assistant, and communication
documented in the PACS or [REDACTED].

## 2021-12-21 IMAGING — MR MR HEAD W/O CM
6 of 10 series · 26 of 48 positions shown · non-contrast
Comparison: Head CT yesterday and earlier.
COMPARISON: Head CT yesterday and earlier.

Addendum:
CLINICAL DATA: 70-year-old male with altered mental status.

EXAM:
MRI HEAD WITHOUT CONTRAST
TECHNIQUE: Multiplanar, multiecho pulse sequences of the brain and surrounding
structures were obtained without intravenous contrast.

[Series 2: DWI · axial · 3.0mm · 0.94mm/px · z∈[-55,+87]mm · 8 of 99 slices shown (1 of 2)]
[im 1/99]
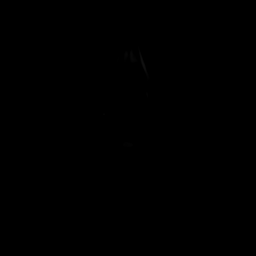
[im 11/99]
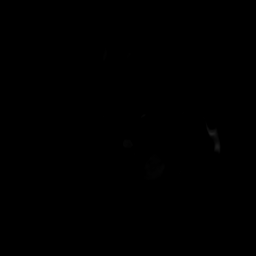
[im 33/99]
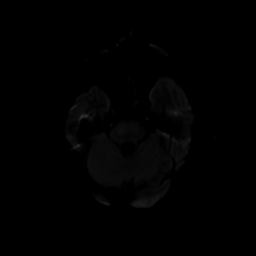
[im 44/99]
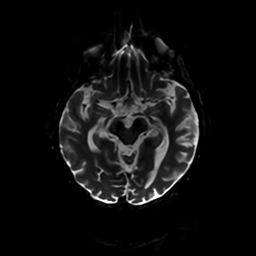
[im 55/99]
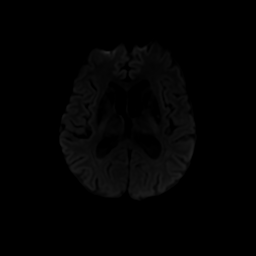
[im 66/99]
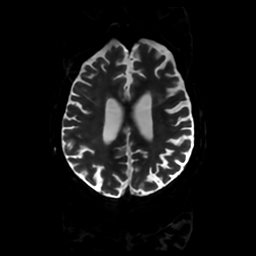
[im 88/99]
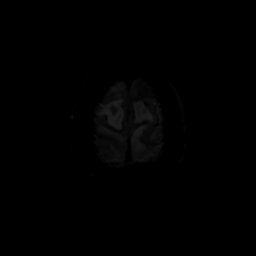
[im 99/99]
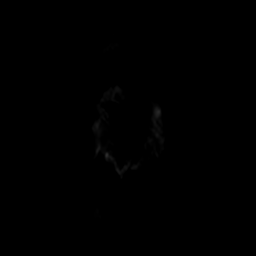

[Series 3: DWI · coronal · 4.0mm · 0.94mm/px · 6 of 67 slices shown (2 of 2)]
[im 1/67]
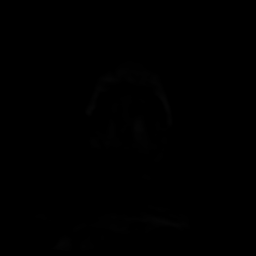
[im 14/67]
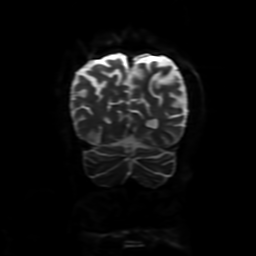
[im 27/67]
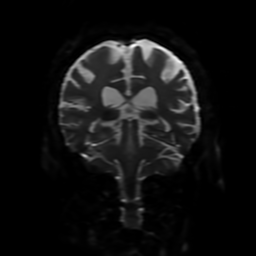
[im 40/67]
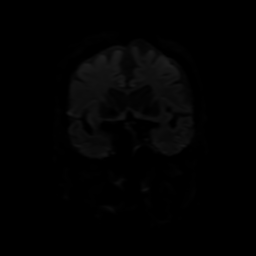
[im 53/67]
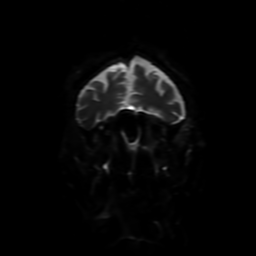
[im 67/67]
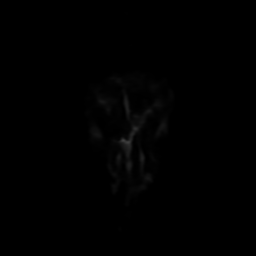

[Series 4: FLAIR · sagittal · 5.0mm · 0.23mm/px · 2 of 23 slices shown (1 of 2)]
[im 1/23]
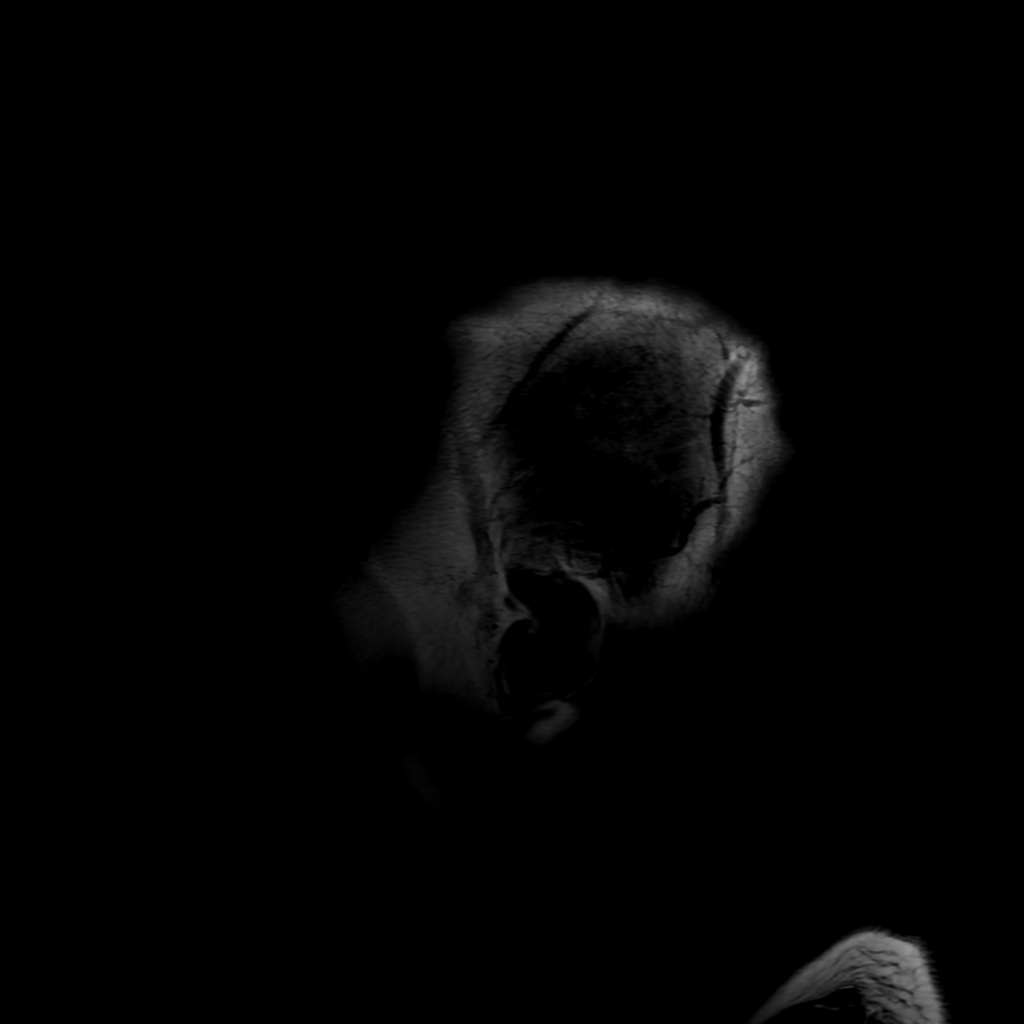
[im 23/23]
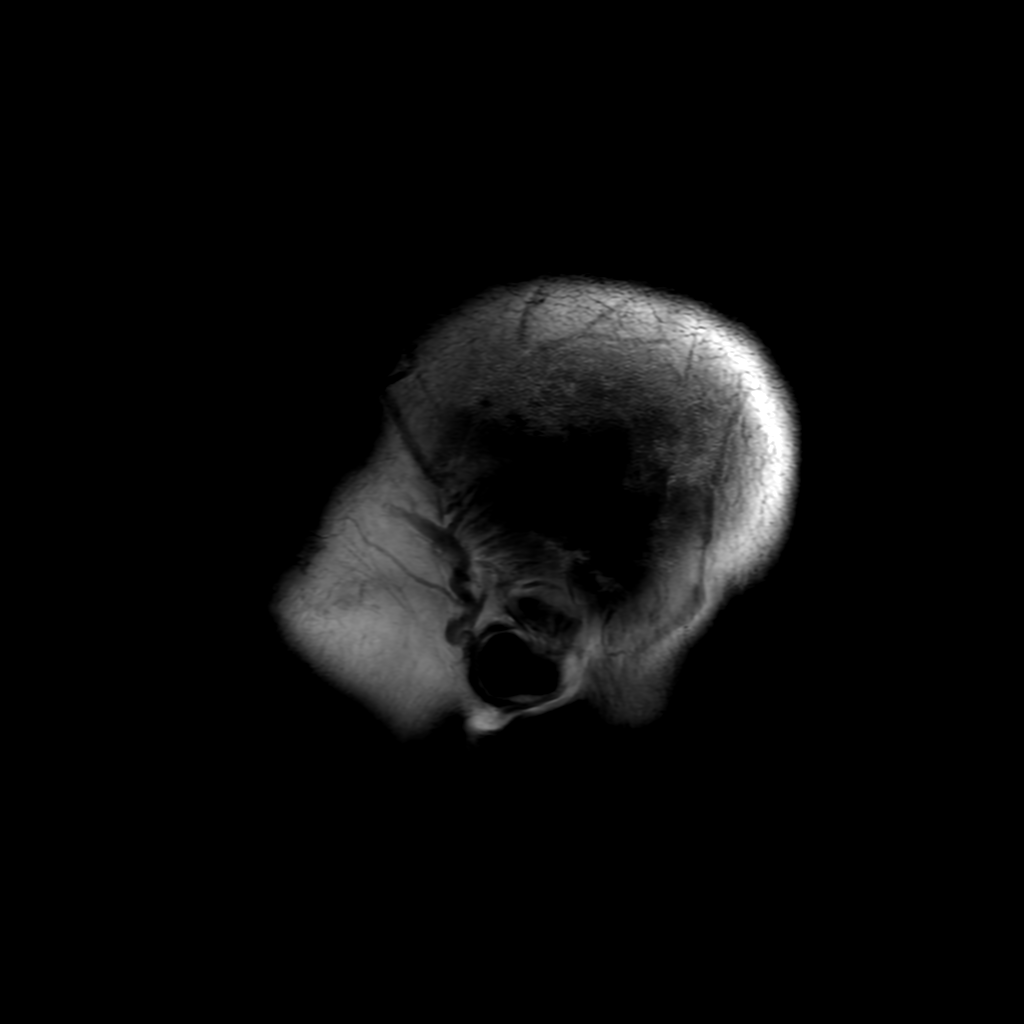

[Series 6: FLAIR · axial · 4.0mm · 0.45mm/px · z∈[-55,+86]mm · 3 of 34 slices shown (2 of 2)]
[im 1/34]
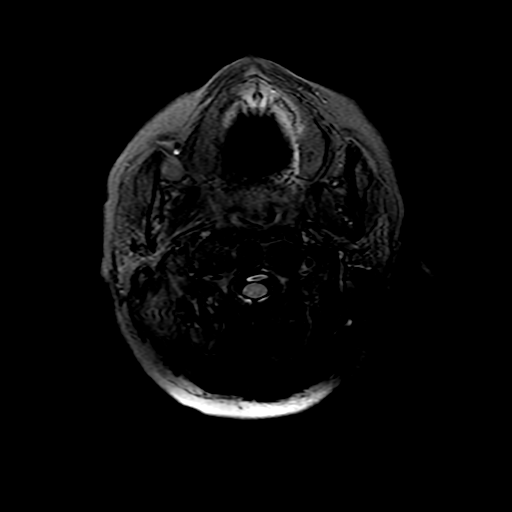
[im 17/34]
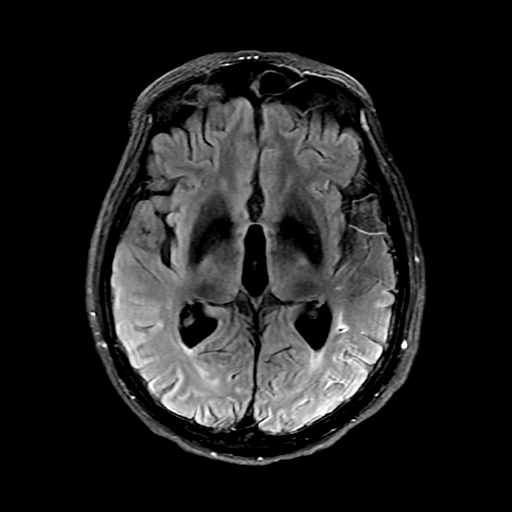
[im 34/34]
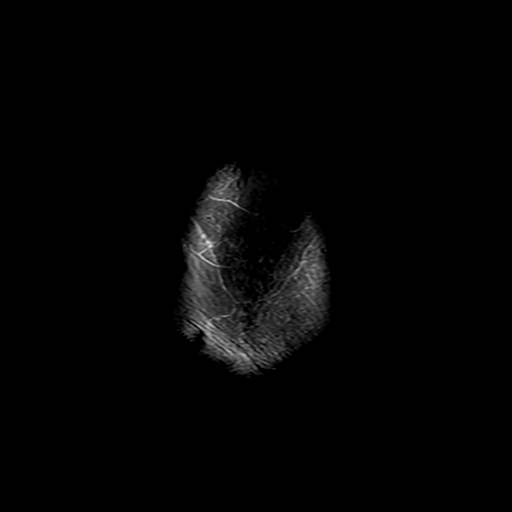

[Series 250: ADC · axial · 3.0mm · 0.94mm/px · z∈[-55,+87]mm · 5 of 48 slices shown (1 of 2)]
[im 1/48]
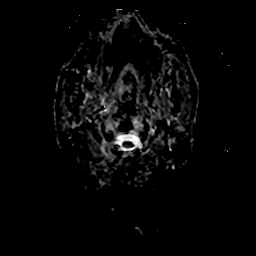
[im 12/48]
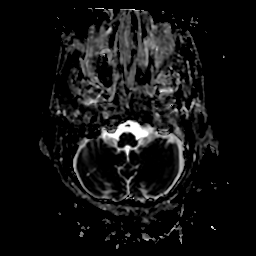
[im 24/48]
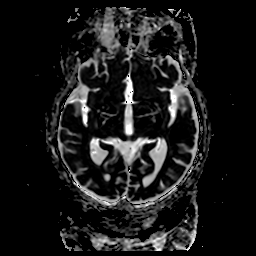
[im 36/48]
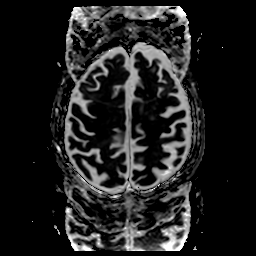
[im 48/48]
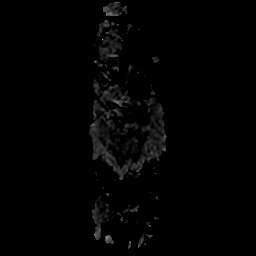

[Series 350: ADC · coronal · 4.0mm · 0.94mm/px · 2 of 30 slices shown (2 of 2)]
[im 1/30]
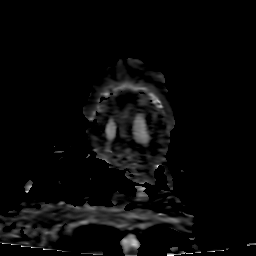
[im 15/30]
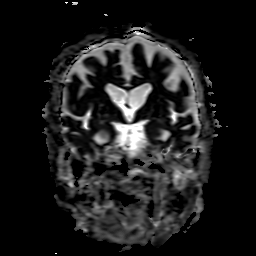

[26 of 48 positions shown; findings below may reference images not displayed]

FINDINGS: Brain: Small area of linear restricted diffusion in the white matter
of the left temporal stem on series 2, images 24 and 25.

No other convincing restricted diffusion. No midline shift, mass
effect, evidence of mass lesion, ventriculomegaly, extra-axial
collection or acute intracranial hemorrhage. Cervicomedullary
junction and pituitary are within normal limits.

Small chronic infarct right inferior cerebellum. Small area of
chronic appearing cortical encephalomalacia in the anterior left
superior frontal gyrus on series 6, image 26 and left middle frontal
gyrus on the same image. Asymmetric additional patchy left cerebral
white matter T2 and FLAIR hyperintensity. Mild to moderate T2 and
FLAIR heterogeneity in the pons.

No other cortical encephalomalacia identified. No definite chronic
cerebral blood products. Deep gray nuclei remain within normal
limits for age.

Vascular: Loss of the cervical left ICA and left ICA siphon flow
void with evidence of reconstituted flow at the left ICA terminus.
a asymmetric FLAIR hyperintensity of left MCA branches in the left
hemisphere.

Other Major intracranial vascular flow voids are preserved.

Skull and upper cervical spine: Negative visible cervical spine.
Visualized bone marrow signal is within normal limits.

Sinuses/Orbits: Previous paranasal sinus surgery with generalized
sinus mucosal thickening. Retained secretions in the visible
pharynx. Negative orbits with postoperative changes to both globes.

Other: Mild bilateral mastoid effusions.
IMPRESSION: 1. Left ICA with poor flow or occlusion in the neck and at the skull
base. Evidence of reconstituted flow at the Left ICA terminus by
MRI, but also asymmetric decreased flow in Left MCA branches.

2. However, only trace acute ischemia is identified in the Left MCA
territory (left temporal lobe white matter) with chronic cortical
infarcts in the left middle and superior frontal gyri.

3. Evidence of some underlying chronic small vessel disease
including in the pons.

ADDENDUM:
Study discussed by telephone with Dr. BARAKA on [DATE] at [5I]
hours.

*** End of Addendum ***
FINDINGS: Brain: Small area of linear restricted diffusion in the white matter
of the left temporal stem on series 2, images 24 and 25.

No other convincing restricted diffusion. No midline shift, mass
effect, evidence of mass lesion, ventriculomegaly, extra-axial
collection or acute intracranial hemorrhage. Cervicomedullary
junction and pituitary are within normal limits.

Small chronic infarct right inferior cerebellum. Small area of
chronic appearing cortical encephalomalacia in the anterior left
superior frontal gyrus on series 6, image 26 and left middle frontal
gyrus on the same image. Asymmetric additional patchy left cerebral
white matter T2 and FLAIR hyperintensity. Mild to moderate T2 and
FLAIR heterogeneity in the pons.

No other cortical encephalomalacia identified. No definite chronic
cerebral blood products. Deep gray nuclei remain within normal
limits for age.

Vascular: Loss of the cervical left ICA and left ICA siphon flow
void with evidence of reconstituted flow at the left ICA terminus.
a asymmetric FLAIR hyperintensity of left MCA branches in the left
hemisphere.

Other Major intracranial vascular flow voids are preserved.

Skull and upper cervical spine: Negative visible cervical spine.
Visualized bone marrow signal is within normal limits.

Sinuses/Orbits: Previous paranasal sinus surgery with generalized
sinus mucosal thickening. Retained secretions in the visible
pharynx. Negative orbits with postoperative changes to both globes.

Other: Mild bilateral mastoid effusions.
IMPRESSION: 1. Left ICA with poor flow or occlusion in the neck and at the skull
base. Evidence of reconstituted flow at the Left ICA terminus by
MRI, but also asymmetric decreased flow in Left MCA branches.

2. However, only trace acute ischemia is identified in the Left MCA
territory (left temporal lobe white matter) with chronic cortical
infarcts in the left middle and superior frontal gyri.

3. Evidence of some underlying chronic small vessel disease
including in the pons.

## 2021-12-21 MED ORDER — METOPROLOL TARTRATE 5 MG/5ML IV SOLN
5.0000 mg | INTRAVENOUS | Status: DC | PRN
  Administered 2021-12-21 – 2021-12-23 (×4): 5 mg via INTRAVENOUS
  Filled 2021-12-21 (×4): qty 5

## 2021-12-21 MED ORDER — CLOPIDOGREL BISULFATE 75 MG PO TABS: 75.0000 mg | ORAL_TABLET | Freq: Every day | ORAL | Status: DC

## 2021-12-21 MED ORDER — CHLORHEXIDINE GLUCONATE CLOTH 2 % EX PADS
6.0000 | MEDICATED_PAD | Freq: Every day | CUTANEOUS | Status: DC
  Administered 2021-12-21 – 2021-12-30 (×9): 6 via TOPICAL

## 2021-12-21 MED ORDER — VITAL AF 1.2 CAL PO LIQD
1000.0000 mL | ORAL | Status: DC
Start: 2021-12-21 — End: 2021-12-28
  Administered 2021-12-21 – 2021-12-23 (×3): 1000 mL
  Filled 2021-12-21 (×6): qty 1000

## 2021-12-21 MED ORDER — ASPIRIN 325 MG PO TABS
325.0000 mg | ORAL_TABLET | Freq: Every day | ORAL | Status: DC
Start: 2021-12-21 — End: 2021-12-21
  Filled 2021-12-21: qty 1

## 2021-12-21 MED ORDER — SODIUM BICARBONATE 8.4 % IV SOLN
INTRAVENOUS | Status: DC
  Filled 2021-12-21: qty 1000

## 2021-12-21 MED ORDER — CHLORHEXIDINE GLUCONATE 0.12 % MT SOLN
OROMUCOSAL | Status: AC
  Administered 2021-12-21: 15 mL via OROMUCOSAL
  Filled 2021-12-21: qty 15

## 2021-12-21 MED ORDER — HEPARIN (PORCINE) 25000 UT/250ML-% IV SOLN
800.0000 [IU]/h | INTRAVENOUS | Status: DC
  Administered 2021-12-21: 800 [IU]/h via INTRAVENOUS
  Filled 2021-12-21: qty 250

## 2021-12-21 MED ORDER — ASPIRIN 325 MG PO TABS
325.0000 mg | ORAL_TABLET | Freq: Once | ORAL | Status: AC
  Administered 2021-12-21: 325 mg

## 2021-12-21 MED ORDER — INSULIN ASPART 100 UNIT/ML IJ SOLN
0.0000 [IU] | INTRAMUSCULAR | Status: DC
  Administered 2021-12-21: 5 [IU] via SUBCUTANEOUS
  Administered 2021-12-21: 11 [IU] via SUBCUTANEOUS
  Administered 2021-12-21 – 2021-12-22 (×5): 8 [IU] via SUBCUTANEOUS

## 2021-12-21 MED ORDER — CLOPIDOGREL BISULFATE 75 MG PO TABS
75.0000 mg | ORAL_TABLET | Freq: Every day | ORAL | Status: DC
  Administered 2021-12-21 – 2021-12-23 (×3): 75 mg
  Filled 2021-12-21 (×3): qty 1

## 2021-12-21 MED ORDER — MAGNESIUM SULFATE 2 GM/50ML IV SOLN
2.0000 g | Freq: Once | INTRAVENOUS | Status: AC
  Administered 2021-12-21: 2 g via INTRAVENOUS
  Filled 2021-12-21: qty 50

## 2021-12-21 MED ORDER — POTASSIUM CHLORIDE 20 MEQ PO PACK
40.0000 meq | PACK | Freq: Three times a day (TID) | ORAL | Status: AC
  Administered 2021-12-21 (×2): 40 meq
  Filled 2021-12-21 (×2): qty 2

## 2021-12-21 MED ORDER — FENTANYL CITRATE (PF) 100 MCG/2ML IJ SOLN
25.0000 ug | INTRAMUSCULAR | Status: DC | PRN
  Administered 2021-12-21: 25 ug via INTRAVENOUS
  Administered 2021-12-21 – 2021-12-22 (×3): 50 ug via INTRAVENOUS
  Administered 2021-12-22: 25 ug via INTRAVENOUS
  Filled 2021-12-21 (×6): qty 2

## 2021-12-21 MED ORDER — POTASSIUM CHLORIDE 10 MEQ/100ML IV SOLN
10.0000 meq | INTRAVENOUS | Status: AC
  Administered 2021-12-21 (×4): 10 meq via INTRAVENOUS
  Filled 2021-12-21 (×4): qty 100

## 2021-12-21 MED ORDER — SODIUM BICARBONATE 8.4 % IV SOLN
100.0000 meq | Freq: Once | INTRAVENOUS | Status: AC
  Administered 2021-12-21: 100 meq via INTRAVENOUS
  Filled 2021-12-21: qty 100

## 2021-12-21 NOTE — Progress Notes (Signed)
ANTICOAGULATION CONSULT NOTE - Initial Consult ? ?Pharmacy Consult for heparin ?Indication: atrial fibrillation ? ?Allergies  ?Allergen Reactions  ? Actos [Pioglitazone] Shortness Of Breath  ?  Leg swelling   ? Atorvastatin   ?  Other reaction(s): leg pain  ? Lisinopril Cough  ? ? ?Patient Measurements: ?Height: 5\' 3"  (160 cm) ?Weight: 51.2 kg (112 lb 14 oz) ?IBW/kg (Calculated) : 56.9 ?Heparin Dosing Weight: 51.2 ? ?Vital Signs: ?Temp: 99.3 ?F (37.4 ?C) (04/30 0800) ?Temp Source: Esophageal (04/30 0400) ?BP: 120/56 (04/30 0800) ?Pulse Rate: 100 (04/30 0800) ? ?Labs: ?Recent Labs  ?  12/20/21 ?1116 12/20/21 ?1127 12/20/21 ?1834 12/20/21 ?2036 12/21/21 ?0103 12/21/21 ?0105 12/21/21 ?[id number] 12/21/21 ?[id number]  ?HGB 10.4*   < > 10.6*   < >  --  11.2* 9.9* 9.9*  ?HCT 33.5*   < > 33.6*   < >  --  33.0* 29.0* 32.1*  ?PLT 284  --  292  --   --   --   --  244  ?CREATININE 2.19*  --  2.16*  2.10*  --  2.13*  --   --  2.03*  ? < > = values in this interval not displayed.  ? ? ?Estimated Creatinine Clearance: 23.8 mL/min (A) (by C-G formula based on SCr of 2.03 mg/dL (H)). ? ? ?Medical History: ?Past Medical History:  ?Diagnosis Date  ? Carotid artery disease ([place])   ? L-ICA 100%, mod R-ICA dz  ? COPD (chronic obstructive pulmonary disease) ([place])   ? Coronary artery disease   ? s/p CABG February 2010 by Dr. Merilynn Finland  ? Diabetes ([place])   ? Emphysema lung ([place])   ? Hyperlipidemia   ? Hypertension   ? Peripheral arterial disease ([place])   ? post left common iliac and right SFA stenting remotely  ? Tobacco abuse   ?    ? ? ?Assessment: ?71 yo male with afib. Received heparin 5000 units sq this AM for VTE prophylaxis. Pharmacy consulted to start heparin drip. No prior AC noted PTA.  ? ? ?Goal of Therapy:  ?Heparin level 0.3-0.7 units/ml ?Monitor platelets by anticoagulation protocol: Yes ?  ?Plan:  ?D/c sq heparin ?Heparin drip at 800 units/hr, no bolus ?Heparin level in 8 hours ?Daily Heparin level and CBC ? ?Cathaleen Korol A. Levada Dy,  PharmD, BCPS, FNKF ?Clinical Pharmacist ?[place] ?Please utilize [facility] for appropriate phone number to reach the unit pharmacist ([place]) ? ?12/21/2021,8:42 AM ? ? ?

## 2021-12-21 NOTE — Progress Notes (Signed)
Patient was in atrial fibrillation in the morning, with this finding and given concern for stroke, IV heparin was started. MRI shows acute stroke in the left hemisphere. Patient's troponin came back elevated. Repeat troponin pending. Patient has hx of HLD, CAD s/p CABG, PAD s/p stent placement and carotid artery disease. Neurology consulted and recommended holding IV heparin until evaluation. Heparin is currently held. Patient given ASA 325 mg and his home clopidogrel restarted. Patient currently in NSR. Will await further instruction from neurology regarding the heparin drip.  ? ?Idamae Schuller, MD ?[facility]. [facility] ?Internal Medicine Residency, PGY-1  ?

## 2021-12-21 NOTE — Consult Note (Signed)
Neurology Consultation ?Reason for Consult: Stroke on MRI, right arm weakness ?Requesting Physician: Kara Mead ? ?CC: Generalized weakness ? ?History is obtained from: Chart review and family at bedside ? ?HPI: Reginald Ayers is a 71 y.o. male with a past medical history significant for bilateral carotid artery stenosis (total occlusion of the left, moderate stenosis of the right), hypertension, hyperlipidemia, prior tobacco abuse, diabetes, coronary artery disease s/p CABG, peripheral vascular disease s/p stents, COPD ? ?Prior to admission he had 3 weeks of increasing generalized weakness as well as an episode of vomiting.  On presentation to the ED he was found to be septic secondary to community-acquired pneumonia, intubated and started on ceftriaxone, azithromycin and steroids.  There was some concern for slurred speech and he developed A-fib with RVR in the hospital, prompting initiation of heparin and MRI brain which revealed a small stroke for which neurology was consulted.  He was hypotensive to the 50s over 40s related to intubation, sedation, sepsis and possibly with contribution from A-fib with RVR.  He spontaneously converted into normal sinus rhythm while in MRI.  At the time of my evaluation at 4 PM he is no longer requiring Levophed ? ?Family additionally reports some headache, concern for sinus infection, cough, dizziness, but deny any focal weakness.  No known history of atrial fibrillation.  They were not aware of his diagnosis of left carotid artery occlusion ? ?LKW: Approximately 3 weeks prior to admission ?tPA given?: No, out of the window ?Premorbid modified rankin scale:  ?    2 - Slight disability. Able to look after own affairs without assistance, but unable to carry out all previous activities. ?    3 - Moderate disability. Requires some help, but able to walk unassisted. ?  ?ROS: Unable to obtain due to altered mental status.  ? ?Past Medical History:  ?Diagnosis Date  ? Carotid artery  disease ([place])   ? L-ICA 100%, mod R-ICA dz  ? COPD (chronic obstructive pulmonary disease) ([place])   ? Coronary artery disease   ? s/p CABG February 2010 by Dr. Merilynn Finland  ? Diabetes ([place])   ? Emphysema lung ([place])   ? Hyperlipidemia   ? Hypertension   ? Peripheral arterial disease ([place])   ? post left common iliac and right SFA stenting remotely  ? Tobacco abuse   ?    ? ?Past Surgical History:  ?Procedure Laterality Date  ? ABDOMINAL AORTOGRAM N/A 04/20/2017  ? Procedure: ABDOMINAL AORTOGRAM;  Surgeon: Serafina Mitchell, MD;  Location: [facility];  Service: Cardiovascular;  Laterality: N/A;  ? ABDOMINAL AORTOGRAM W/LOWER EXTREMITY Right 12/08/2016  ? Procedure: Abdominal Aortogram w/Lower Extremity;  Surgeon: Serafina Mitchell, MD;  Location: [facility];  Service: Cardiovascular;  Laterality: Right;  ? ABDOMINAL AORTOGRAM W/LOWER EXTREMITY N/A 09/07/2017  ? Procedure: ABDOMINAL AORTOGRAM W/LOWER EXTREMITY;  Surgeon: Serafina Mitchell, MD;  Location: [facility];  Service: Cardiovascular;  Laterality: N/A;  ? CARDIAC CATHETERIZATION  2010  ? CAROTID ANGIOGRAM  2014  ? CORONARY ARTERY BYPASS GRAFT  2010  ? LIMA-LAD, Lrad-OM1, SVG-RI, SVG-AM-dRCA  ? DOPPLER ECHOCARDIOGRAPHY  2010  ? LOWER EXTREMITY ANGIOGRAM  11/2006  ? left common femoral endarterectomy and patch angioplasty:The mid right SFA was angioplastied with a 4 x 8  ? LOWER EXTREMITY ANGIOGRAM  2003  ? left common iliac artery stenting by Dr. Adora Fridge  ? LOWER EXTREMITY ANGIOGRAPHY Right 04/20/2017  ? Procedure: Lower Extremity Angiography;  Surgeon: Serafina Mitchell, MD;  Location: [facility];  Service: Cardiovascular;  Laterality: Right;  ? NM MYOVIEW LTD  2015  ? PERIPHERAL VASCULAR ATHERECTOMY Right 12/08/2016  ? Procedure: Peripheral Vascular Atherectomy;  Surgeon: Serafina Mitchell, MD;  Location: [facility];  Service: Cardiovascular;  Laterality: Right;  ? PERIPHERAL VASCULAR BALLOON ANGIOPLASTY Right 12/08/2016  ?  Procedure: Peripheral Vascular Balloon Angioplasty;  Surgeon: Serafina Mitchell, MD;  Location: [facility];  Service: Cardiovascular;  Laterality: Right;  ? PERIPHERAL VASCULAR BALLOON ANGIOPLASTY Right 04/20/2017  ? Procedure: PERIPHERAL VASCULAR BALLOON ANGIOPLASTY;  Surgeon: Serafina Mitchell, MD;  Location: [facility];  Service: Cardiovascular;  Laterality: Right;  SFA  ? PERIPHERAL VASCULAR CATHETERIZATION N/A 09/22/2016  ? Procedure: Abdominal Aortogram w/ bilateral Lower Extremity [place];  Surgeon: Serafina Mitchell, MD;  Location: [facility];  Service: Cardiovascular;  Laterality: N/A;  ? PERIPHERAL VASCULAR CATHETERIZATION Right 09/22/2016  ? Procedure: Peripheral Vascular Intervention;  Surgeon: Serafina Mitchell, MD;  Location: [facility];  Service: Cardiovascular;  Laterality: Right;  ? ? ?Current Facility-Administered Medications:  ?  0.9 %  sodium chloride infusion, 250 mL, Intravenous, Continuous, Agarwala, Ravi, MD, Stopped at 12/21/21 1531 ?  acetaminophen (TYLENOL) tablet 650 mg, 650 mg, Oral, Q6H PRN **OR** acetaminophen (TYLENOL) suppository 650 mg, 650 mg, Rectal, Q6H PRN, Tamala Julian, Rondell A, MD ?  albuterol (PROVENTIL) (2.5 MG/3ML) 0.083% nebulizer solution 2.5 mg, 2.5 mg, Nebulization, Q2H PRN, Tamala Julian, Rondell A, MD ?  arformoterol (BROVANA) nebulizer solution 15 mcg, 15 mcg, Nebulization, BID, Agarwala, Ravi, MD, 15 mcg at 12/21/21 0819 ?  azithromycin (ZITHROMAX) 500 mg in sodium chloride 0.9 % 250 mL IVPB, 500 mg, Intravenous, Q24H, Smith, Rondell A, MD, Last Rate: 250 mL/hr at 12/21/21 1424, 500 mg at 12/21/21 1424 ?  budesonide (PULMICORT) nebulizer solution 0.25 mg, 0.25 mg, Nebulization, BID, Agarwala, Ravi, MD, 0.25 mg at 12/21/21 0748 ?  cefTRIAXone (ROCEPHIN) 2 g in sodium chloride 0.9 % 100 mL IVPB, 2 g, Intravenous, Q24H, Smith, Rondell A, MD, Last Rate: 200 mL/hr at 12/21/21 1244, 2 g at 12/21/21 1244 ?  chlorhexidine gluconate (MEDLINE KIT) (PERIDEX) 0.12 %  solution 15 mL, 15 mL, Mouth Rinse, BID, Agarwala, Ravi, MD, 15 mL at 12/21/21 0800 ?  Chlorhexidine Gluconate Cloth 2 % PADS 6 each, 6 each, Topical, Daily, Agarwala, Ravi, MD ?  clopidogrel (PLAVIX) tablet 75 mg, 75 mg, Per Tube, Daily, Idamae Schuller, MD, 75 mg at 12/21/21 1516 ?  docusate (COLACE) 50 MG/5ML liquid 100 mg, 100 mg, Per Tube, BID PRN, Kipp Brood, MD ?  feeding supplement (VITAL AF 1.2 CAL) liquid 1,000 mL, 1,000 mL, Per Tube, Continuous, Rigoberto Noel, MD, Last Rate: 50 mL/hr at 12/21/21 1244, 1,000 mL at 12/21/21 1244 ?  fentaNYL (SUBLIMAZE) injection 25-100 mcg, 25-100 mcg, Intravenous, Q2H PRN, Rigoberto Noel, MD ?  insulin aspart (novoLOG) injection 0-15 Units, 0-15 Units, Subcutaneous, Q4H, Rigoberto Noel, MD, 8 Units at 12/21/21 1620 ?  MEDLINE mouth rinse, 15 mL, Mouth Rinse, 10 times per day, Agarwala, Einar Grad, MD, 15 mL at 12/21/21 1511 ?  metoprolol tartrate (LOPRESSOR) injection 5 mg, 5 mg, Intravenous, Q4H PRN, Rigoberto Noel, MD ?  norepinephrine (LEVOPHED) 33m in 2565m(0.016 mg/mL) premix infusion, 2-10 mcg/min, Intravenous, Titrated, Agarwala, Ravi, MD, Last Rate: 7.5 mL/hr at 12/21/21 0953, 2 mcg/min at 12/21/21 0953 ?  ondansetron (ZOFRAN) tablet 4 mg, 4 mg, Oral, Q6H  PRN **OR** ondansetron (ZOFRAN) injection 4 mg, 4 mg, Intravenous, Q6H PRN, Tamala Julian, Rondell A, MD ?  pantoprazole sodium (PROTONIX) 40 mg/20 mL oral suspension 40 mg, 40 mg, Per Tube, Daily, Agarwala, Ravi, MD, 40 mg at 12/21/21 5537 ?  polyethylene glycol (MIRALAX / GLYCOLAX) packet 17 g, 17 g, Oral, Daily PRN, Agarwala, Ravi, MD ?  propofol (DIPRIVAN) 1000 MG/100ML infusion, 0-50 mcg/kg/min, Intravenous, Continuous, Isla Pence, MD, Paused at 12/21/21 1559 ?  revefenacin (YUPELRI) nebulizer solution 175 mcg, 175 mcg, Nebulization, Daily, Agarwala, Ravi, MD, 175 mcg at 12/21/21 0748 ?  sodium chloride 0.9 % bolus 1,000 mL, 1,000 mL, Intravenous, Once, Norval Morton, MD, Held at 12/20/21 1301 ?  sodium chloride  flush (NS) 0.9 % injection 3 mL, 3 mL, Intravenous, Q12H, Smith, Rondell A, MD, 3 mL at 12/21/21 0930 ? ?Current Outpatient Medications  ?Medication Instructions  ? albuterol (VENTOLIN HFA) 108 (90 Base) MCG/A

## 2021-12-21 NOTE — Plan of Care (Signed)
?  Problem: Nutrition: ?Goal: Adequate nutrition will be maintained ?Outcome: Progressing ?Note: Pt is tolerating tube feeds well. BM this morning ?  ?Problem: Elimination: ?Goal: Will not experience complications related to bowel motility ?Outcome: Progressing ?Goal: Will not experience complications related to urinary retention ?Outcome: Progressing ?  ?Problem: Pain Managment: ?Goal: General experience of comfort will improve ?Outcome: Progressing ?  ?Problem: Respiratory: ?Goal: Ability to maintain a clear airway and adequate ventilation will improve ?Outcome: Progressing ?  ?Problem: Activity: ?Goal: Risk for activity intolerance will decrease ?Outcome: Not Progressing ?Note: Pt is sedated and on ventilator. Unable to mobilize at this time. ?  ?

## 2021-12-21 NOTE — Progress Notes (Signed)
Initial Nutrition Assessment ? ?DOCUMENTATION CODES:  ? ?Not applicable ? ?INTERVENTION:  ? ?Tube feeding via OG tube: ?Change to Vital AF 1.2 at 50 ml/h (1200 ml per day). ? ?Provides 1440 kcal (1612 kcal total with propofol), 90 gm protein, 973 ml free water daily. ? ?Monitor magnesium, potassium, and phosphorus BID for at least 3 days, MD to replete as needed, as pt is at risk for refeeding syndrome given recent severe weight loss. ? ?NUTRITION DIAGNOSIS:  ? ?Inadequate oral intake related to inability to eat as evidenced by NPO status. ? ?GOAL:  ? ?Patient will meet greater than or equal to 90% of their needs ? ?MONITOR:  ? ?Vent status, TF tolerance, Labs ? ?REASON FOR ASSESSMENT:  ? ?Ventilator, Consult ?Enteral/tube feeding initiation and management ? ?ASSESSMENT:  ? ?71 yo male admitted with AKI, acute respiratory failure requiring intubation, septic shock, CAP. PMH includes CAD, PAD, DM, HTN, HLD, tobacco abuse, COPD. ? ?Received MD Consult for TF initiation and management. ?OG tube in place. Tip is likely beyond the gastric antrum per chest x-ray. ?Currently receiving Vital High Protein at 40 ml/h with Prosource TF 45 ml BID to provide 1040 kcal, 106 gm protein, 803 ml free water daily.  ? ?Patient is currently intubated on ventilator support ?MV: 11.1 L/min ?Temp (24hrs), Avg:98 ?F (36.7 ?C), Min:95.2 ?F (35.1 ?C), Max:99.3 ?F (37.4 ?C) ?MAP >/= 65 ?Propofol: 6.5 ml/hr providing 172 kcal from lipid ? ?Labs reviewed. K 2.8 (receiving IV K replacement) ?CBG: 219-336 ? ?Medications reviewed and include Novolog, KCl, Levophed, propofol. ?  ?Weight history reviewed.  ?Patient weighed 59 kg 09/27/21. Currently 51.2 kg ?13% weight loss within 3 months is severe.  ? ?NUTRITION - FOCUSED PHYSICAL EXAM: ? ?Unable to complete ? ?Diet Order:   ?Diet Order   ? ?       ?  Diet NPO time specified  Diet effective now       ?  ? ?  ?  ? ?  ? ? ?EDUCATION NEEDS:  ? ?No education needs have been identified at this  time ? ?Skin:  Skin Assessment: Skin Integrity Issues: ?Skin Integrity Issues:: Stage I ?Stage I: R & L buttocks ? ?Last BM:  4/30 type 5 ? ?Height:  ? ?Ht Readings from Last 1 Encounters:  ?12/20/21 5\' 3"  (1.6 m)  ? ? ?Weight:  ? ?Wt Readings from Last 1 Encounters:  ?12/21/21 51.2 kg  ? ? ? ?BMI:  Body mass index is 20 kg/m?. ? ?Estimated Nutritional Needs:  ? ?Kcal:  1500-1700 ? ?Protein:  75-95 gm ? ?Fluid:  1.5-1.7 L ? ? ? ?Reginald Ayers RD, LDN, CNSC ?Please refer to Amion for contact information.                                                       ? ?

## 2021-12-21 NOTE — Progress Notes (Signed)
SLP Cancellation Note ? ?Patient Details ?Name: Reginald Ayers ?MRN: [id number] ?DOB: 24-Oct-1950 ? ? ?Cancelled treatment:       Reason Eval/Treat Not Completed: Patient not medically ready (Pt intubated on 4/29 and remains on the vent at this time. SLP will follow up on subsequent date unless contacted sooner.) ? ?Lunna Vogelgesang I. Hardin Negus, [place], CCC-SLP ?Acute Rehabilitation Services ?Office number [phone] ?Pager [phone] ? ?Horton Marshall ?12/21/2021, 6:37 AM ?

## 2021-12-21 NOTE — Progress Notes (Signed)
PT Cancellation Note ? ?Patient Details ?Name: [name] ?MRN: [id number] ?DOB: 1950-12-01 ? ? ?Cancelled Treatment:    Reason Eval/Treat Not Completed: Other (comment) ? ?Pt's intubated, HR elevated at rest and in RVR 124-150 BPM at rest. RN aware. Possible extubation later today and MRI at 10am;  ? ?Will follow up later today as time allows;  ?Otherwise, will follow up for PT tomorrow;  ? ?Thank you,  ?Roney Marion, PT  ?Acute Rehabilitation Services ?Pager [phone] ?Office [phone] ? ? ? ?Colletta [place] ?12/21/2021, 10:42 AM ?

## 2021-12-21 NOTE — Progress Notes (Addendum)
eLink Physician-Brief Progress Note ?Patient Name: Reginald Ayers ?DOB: 1951-03-20 ?MRN: [id number] ? ? ?Date of Service ? 12/21/2021  ?HPI/Events of Note ? Severe metabolic acidosis persists with normal AG.  On bicarb drip D5W with 3 amps at 100 cc/her.  VBG ph 7.07.  lactate only mildly elevated.  ?eICU Interventions ? Discussed with nephrology ?Push 2 amps bicarb ?Increase drip rate to 150 cc/hr ?Repeat bmp  ? ? ? ?Intervention Category ?Evaluation Type: New Patient Evaluation ? ?Henry Russel, P ?12/21/2021, 2:22 AM ?

## 2021-12-21 NOTE — Progress Notes (Signed)
Pt was transported to MRI and back without complications. 

## 2021-12-21 NOTE — Progress Notes (Signed)
? ?NAME:  Reginald Ayers, MRN:  [id number], DOB:  [birth year], LOS: 1 ?ADMISSION DATE:  12/20/2021, CONSULTATION DATE:  12/20/21 ?REFERRING MD:  EDP, CHIEF COMPLAINT:  Respiratory Failure  ? ?History of Present Illness:  ?Patient is a 71 year old man who presented with increasing shortness of breath, weakness that worsened requiring ED visit. He has hx of COPD, HTN, HLD, CAD, PAD, and tobacco use disorder. Patient feeling off-baseline for a few weeks with fatigue, coughing, and shortness of breath. He presented to the ED via EMS and continued to worsen and required intubation and mechanical ventilation for respiratory failure. Patient had episode of vomiting yesterday.  ?  ?Pertinent  Medical History  ? ? Carotid artery disease ([place])    ?  L-ICA 100%, mod R-ICA dz  ? COPD (chronic obstructive pulmonary disease) ([place])    ? Coronary artery disease    ?  s/p CABG February 2010 by Dr. Merilynn Finland  ? Diabetes ([place])    ? Emphysema lung ([place])    ? Hyperlipidemia    ? Hypertension    ? Peripheral arterial disease ([place])    ?  post left common iliac and right SFA stenting remotely  ? Tobacco abuse   ? ?Significant Hospital Events: ?Including procedures, antibiotic start and stop dates in addition to other pertinent events   ?04/29-Intubated and admitted to the ICU.  ?Interim History / Subjective:  ?Intubated and sedated. Opens eyes to voice but still somnolent ?Review of Systems:   ?Negative unless stated in the subjective.  ?Objective   ?Blood pressure (!) 105/54, pulse 89, temperature 99 ?F (37.2 ?C), resp. rate 17, height 5\' 3"  (1.6 m), weight 51.2 kg, SpO2 98 %. ?   ?Vent Mode: PRVC ?FiO2 (%):  [40 %-50 %] 40 % ?Set Rate:  [20 bmp-22 bmp] 22 bmp ?Vt Set:  [450 mL-500 mL] 450 mL ?PEEP:  [5 cmH20] 5 cmH20 ?Plateau Pressure:  [27 cmH20-29 cmH20] 27 cmH20  ? ?Intake/Output Summary (Last 24 hours) at 12/21/2021 0707 ?Last data filed at 12/21/2021 0600 ?Gross per 24 hour  ?Intake 2589.86 ml  ?Output 425 ml  ?Net 2164.86 ml   ? ?Filed Weights  ? 12/20/21 1055 12/21/21 0400  ?Weight: 48.5 kg 51.2 kg  ? ?Examination: ?General: sedated and intubated.  ?HENT: Intubated, NCAT ?Lungs: Rhonchi present, vented sounds ?Cardiovascular: irregularly irregular, 2+ radial pulses ?Abdomen: soft, bowel sounds present ?Extremities: No asymmetry, has bandages on bilateral LE due to dried skin lesions.  ?Neuro: Unable to assess fully, moving right arm but not left, he is moving his lower extremities on command but not left.  ?GU: catheter in place with light colored urine.  ? ?Diagnostics: ?CBC initially with leukocytosis at 19 k and improved to 14 k this am. CMP showed AKI with creatinine of 2.19 and this am improved to 2.03. Phos initially elevated but normal this am. Mag 1.7 this am. K 2.8 this am.  LA initially normal but increased to 3.2 but trending downward. Initial ABG with pH of 7.0 and most recent with 7.29. UA showing many bacteria. Blood cx pending. CT of head negative for acute findings. CTAP with findings of right lower lobe and right middle lobe infiltrate consistent with pneumonia. Minimal patchy infiltrate left lower lobe. No other acute findings noted. CXR shows same findings with hyperinflated lungs.  ?Consults  ? ?Resolved Hospital Problem list   ? ?Assessment & Plan:  ?Sepsis due to pneumonia [facility]) ?Acute respiratory failure with hypoxia ([place]) ?Due to community-acquired pneumonia.  PIRO score predicts moderate to high 28-day mortality. Patient initiating breaths, may be ready for extubation later during the day. Will continue to monitor patient's status.  ? -Ceftriaxone and azithromycin day 2. ?-Steroids hydrocortisone 100 mg BID for pneumonia for 5 days. ?-Full lung protective ventilation. Correct acidosis, maintain Pplat <30.  ?-Follow-up postintubation ABG and adjust FiO2 PEEP accordingly. ? ?AKI (acute kidney injury) ([place]) ?Acidosis, metabolic ?Hypokalemia ?Hypomagnesemia ?Improving. Creatinine 2.0 this am, baseline 1.42. Patient  is s/p 2.6 L. Initial pH was 7.0. This am improved to 7.29. Patient is s/p 2 amp push of bicarb overnight. Will dc bicarb gtt. ABG improving overall.  ?- CTM renal fxn. ?-Avoid nephrotoxic agents and renally dose meds.  ?-Repeat ABG this at noon.  ?-Replete magnesium and potassium as needed for goal of 2 and 4 respectively.  ? ?  ?Concern for Stroke ?New onset Atrial Fibrillation ?Hx of CABG Feb 2010 ?Hx HTN, HLD, CAD, PAD ?Has hx of CABG and hx of HLD, CAD, and PAD. New onset a fib most likely in setting of acute illness. Concern for stroke with intial presentation. Will get MRI of head as CT was negative for acute findings and right arm weakness is present. Will start IV heparin given atrial fib and stroke concern with CT showing no hemorrhage.  ?-Obtain troponin, MRI of head, and echocardiogram ?- Continue ASA, home lipitor. ?-Will continue home metoprolol and start IV heparin given atrial fibrillation and stroke concern.  ? ?Diabetes ([place]) ?Type 2 DM with hyperglycemia. On orals at home.  ? - Basal and moderate sliding scale. Will transition to resistant if he continues to be hyperglycemic given steroid use.  ?  ?COPD (chronic obstructive pulmonary disease) ([place]) ?Emphysematous change on CT but no recorded PFT's  ?-Continue inhaled bronchodilators  ? ?Severe protein-energy malnutrition ([place]) ?Appears cachectic with decrease muscle mass ? - Continue tube feeds per dietary consult.  ?  ?Best Practice (right click and "Reselect all SmartList Selections" daily)  ?Diet/type: tubefeeds ?DVT prophylaxis: prophylactic heparin  ?GI prophylaxis: H2B ?Lines: N/A ?Foley:  Yes, and it is still needed ?Continuous: 25 mcg of fentanyl, and 150 cc bicarb infusion  ?Code Status:  full code ?Last date of multidisciplinary goals of care discussion [Will update family today] ? ?Home Medications  ?Prior to Admission medications   ?Medication Sig Start Date End Date Taking? Authorizing Provider  ?clopidogrel (PLAVIX) 75 MG tablet Take  75 mg by mouth daily.  07/24/13  Yes [provider]  ?empagliflozin (JARDIANCE) 25 MG TABS tablet Take 25 mg by mouth daily.   Yes [provider]  ?losartan (COZAAR) 50 MG tablet Take 50 mg by mouth daily. 07/10/21  Yes [provider]  ?metFORMIN (GLUCOPHAGE) 500 MG tablet Take 1,000 mg by mouth 2 (two) times daily with a meal.  07/24/13  Yes [provider]  ?metoprolol succinate (TOPROL-XL) 25 MG 24 hr tablet TAKE 1 TABLET(25 MG) BY MOUTH DAILY ?Patient taking differently: Take 25 mg by mouth daily. 07/29/20  Yes Lorretta Harp, MD  ?potassium chloride (K-DUR) 10 MEQ tablet Take 10 mEq by mouth daily.   Yes [provider]  ?simvastatin (ZOCOR) 40 MG tablet Take 40 mg by mouth daily.   Yes [provider]  ?albuterol (VENTOLIN HFA) 108 (90 Base) MCG/ACT inhaler Inhale 2 puffs into the lungs every 4 (four) hours as needed. 01/18/20   [provider]  ?atorvastatin (LIPITOR) 40 MG tablet Take 1 tablet (40 mg total) by mouth daily. 07/30/21  10/28/21  Lorretta Harp, MD  ?B Complex Vitamins (B COMPLEX PO) Take 1 tablet by mouth 2 (two) times a week.  ?Patient not taking: Reported on 09/28/2021    [provider]  ?Cyanocobalamin (VITAMIN B 12 PO) Take 1 capsule by mouth daily.    [provider]  ?furosemide (LASIX) 20 MG tablet Take 20 mg by mouth daily.    [provider]  ?Homeopathic Products (LEG CRAMP RELIEF SL) Place 2 tablets under the tongue at bedtime as needed (leg cramps).  ?Patient not taking: Reported on 09/28/2021    [provider]  ?ibuprofen (ADVIL,MOTRIN) 200 MG tablet Take 800 mg by mouth every 8 (eight) hours as needed for moderate pain.    [provider]  ?IRON PO Take 1 tablet by mouth 4 (four) times a week.     [provider]  ?NITROSTAT 0.4 MG SL tablet Take 0.4 mg by mouth every 5 (five) minutes as needed for chest pain.  12/18/14   [provider]  ?Polyvinyl  Alcohol-Povidone (MURINE TEARS FOR DRY EYES OP) Apply 1 drop to eye 3 (three) times daily as needed (dry eyes).    [provider]  ?Pseudoeph-Doxylamine-DM-APAP (NYQUIL PO) Take 1 Dose by mouth at bedt

## 2021-12-21 NOTE — Progress Notes (Signed)
OT Cancellation Note ? ?Patient Details ?Name: Reginald Ayers ?MRN: [id number] ?DOB: 1950/12/19 ? ? ?Cancelled Treatment:    Reason Eval/Treat Not Completed: Patient not medically ready (Pt's intubated, HR elevated at rest and in RVR 124-150 BPM at rest. RN aware. Possible extubation later today and MRI at 10am. OTR to check back as schedule allows.) ? ? ?Jefferey Pica, OTR/L ?Acute Rehabilitation Services ?Office: [phone] ? ?Rosie Golson J Javious Hallisey ?12/21/2021, 9:03 AM ?

## 2021-12-22 ENCOUNTER — Inpatient Hospital Stay (HOSPITAL_COMMUNITY): Payer: Medicare Other

## 2021-12-22 DIAGNOSIS — Z951 Presence of aortocoronary bypass graft: Secondary | ICD-10-CM | POA: Diagnosis not present

## 2021-12-22 DIAGNOSIS — I63132 Cerebral infarction due to embolism of left carotid artery: Secondary | ICD-10-CM | POA: Diagnosis not present

## 2021-12-22 DIAGNOSIS — I10 Essential (primary) hypertension: Secondary | ICD-10-CM | POA: Diagnosis not present

## 2021-12-22 DIAGNOSIS — I6522 Occlusion and stenosis of left carotid artery: Secondary | ICD-10-CM

## 2021-12-22 DIAGNOSIS — I6521 Occlusion and stenosis of right carotid artery: Secondary | ICD-10-CM

## 2021-12-22 DIAGNOSIS — I639 Cerebral infarction, unspecified: Secondary | ICD-10-CM | POA: Diagnosis not present

## 2021-12-22 DIAGNOSIS — N179 Acute kidney failure, unspecified: Secondary | ICD-10-CM | POA: Diagnosis not present

## 2021-12-22 DIAGNOSIS — I4891 Unspecified atrial fibrillation: Secondary | ICD-10-CM | POA: Diagnosis not present

## 2021-12-22 DIAGNOSIS — I739 Peripheral vascular disease, unspecified: Secondary | ICD-10-CM

## 2021-12-22 DIAGNOSIS — I6340 Cerebral infarction due to embolism of unspecified cerebral artery: Secondary | ICD-10-CM | POA: Insufficient documentation

## 2021-12-22 DIAGNOSIS — J9601 Acute respiratory failure with hypoxia: Secondary | ICD-10-CM | POA: Diagnosis not present

## 2021-12-22 DIAGNOSIS — E43 Unspecified severe protein-calorie malnutrition: Secondary | ICD-10-CM

## 2021-12-22 DIAGNOSIS — Z20822 Contact with and (suspected) exposure to covid-19: Secondary | ICD-10-CM | POA: Diagnosis not present

## 2021-12-22 LAB — CBC
HCT: 29.6 % — ABNORMAL LOW (ref 39.0–52.0)
Hemoglobin: 9.7 g/dL — ABNORMAL LOW (ref 13.0–17.0)
MCH: 29.8 pg (ref 26.0–34.0)
MCHC: 32.8 g/dL (ref 30.0–36.0)
MCV: 90.8 fL (ref 80.0–100.0)
Platelets: 228 10*3/uL (ref 150–400)
RBC: 3.26 MIL/uL — ABNORMAL LOW (ref 4.22–5.81)
RDW: 15 % (ref 11.5–15.5)
WBC: 14 10*3/uL — ABNORMAL HIGH (ref 4.0–10.5)
nRBC: 0 % (ref 0.0–0.2)

## 2021-12-22 LAB — GLUCOSE, CAPILLARY
Glucose-Capillary: 259 mg/dL — ABNORMAL HIGH (ref 70–99)
Glucose-Capillary: 269 mg/dL — ABNORMAL HIGH (ref 70–99)
Glucose-Capillary: 177 mg/dL — ABNORMAL HIGH (ref 70–99)
Glucose-Capillary: 205 mg/dL — ABNORMAL HIGH (ref 70–99)
Glucose-Capillary: 240 mg/dL — ABNORMAL HIGH (ref 70–99)
Glucose-Capillary: 258 mg/dL — ABNORMAL HIGH (ref 70–99)

## 2021-12-22 LAB — ECHOCARDIOGRAM COMPLETE
AR max vel: 2.14 cm2
AV Area VTI: 2.2 cm2
AV Area mean vel: 2.05 cm2
AV Mean grad: 5 mmHg
AV Peak grad: 8.8 mmHg
Ao pk vel: 1.48 m/s
Area-P 1/2: 7.16 cm2
Calc EF: 51 %
Height: 63 in
MV VTI: 1.87 cm2
S' Lateral: 3 cm
Single Plane A2C EF: 29 %
Single Plane A4C EF: 67.2 %
Weight: 1806.01 oz

## 2021-12-22 LAB — BASIC METABOLIC PANEL
Anion gap: 10 (ref 5–15)
Anion gap: 10 (ref 5–15)
BUN: 46 mg/dL — ABNORMAL HIGH (ref 8–23)
BUN: 46 mg/dL — ABNORMAL HIGH (ref 8–23)
CO2: 20 mmol/L — ABNORMAL LOW (ref 22–32)
CO2: 23 mmol/L (ref 22–32)
Calcium: 8 mg/dL — ABNORMAL LOW (ref 8.9–10.3)
Calcium: 7.8 mg/dL — ABNORMAL LOW (ref 8.9–10.3)
Chloride: 112 mmol/L — ABNORMAL HIGH (ref 98–111)
Chloride: 114 mmol/L — ABNORMAL HIGH (ref 98–111)
Creatinine, Ser: 1.82 mg/dL — ABNORMAL HIGH (ref 0.61–1.24)
Creatinine, Ser: 1.75 mg/dL — ABNORMAL HIGH (ref 0.61–1.24)
GFR, Estimated: 39 mL/min — ABNORMAL LOW (ref >60)
GFR, Estimated: 41 mL/min — ABNORMAL LOW (ref >60)
Glucose, Bld: 209 mg/dL — ABNORMAL HIGH (ref 70–99)
Glucose, Bld: 176 mg/dL — ABNORMAL HIGH (ref 70–99)
Potassium: 3.8 mmol/L (ref 3.5–5.1)
Potassium: 3.2 mmol/L — ABNORMAL LOW (ref 3.5–5.1)
Sodium: 142 mmol/L (ref 135–145)
Sodium: 147 mmol/L — ABNORMAL HIGH (ref 135–145)

## 2021-12-22 LAB — PHOSPHORUS
Phosphorus: 1.1 mg/dL — ABNORMAL LOW (ref 2.5–4.6)
Phosphorus: 1.3 mg/dL — ABNORMAL LOW (ref 2.5–4.6)

## 2021-12-22 LAB — HEPARIN LEVEL (UNFRACTIONATED): Heparin Unfractionated: 0.24 IU/mL — ABNORMAL LOW (ref 0.30–0.70)

## 2021-12-22 LAB — TROPONIN I (HIGH SENSITIVITY): Troponin I (High Sensitivity): 432 ng/L (ref <18)

## 2021-12-22 LAB — MAGNESIUM: Magnesium: 2.3 mg/dL (ref 1.7–2.4)

## 2021-12-22 IMAGING — DX DG CHEST 1V PORT
1 series · 1 of 1 positions shown · non-contrast
Comparison: [DATE].

CLINICAL DATA: Pneumonia.  COPD.

EXAM:
PORTABLE CHEST 1 VIEW

[chest]
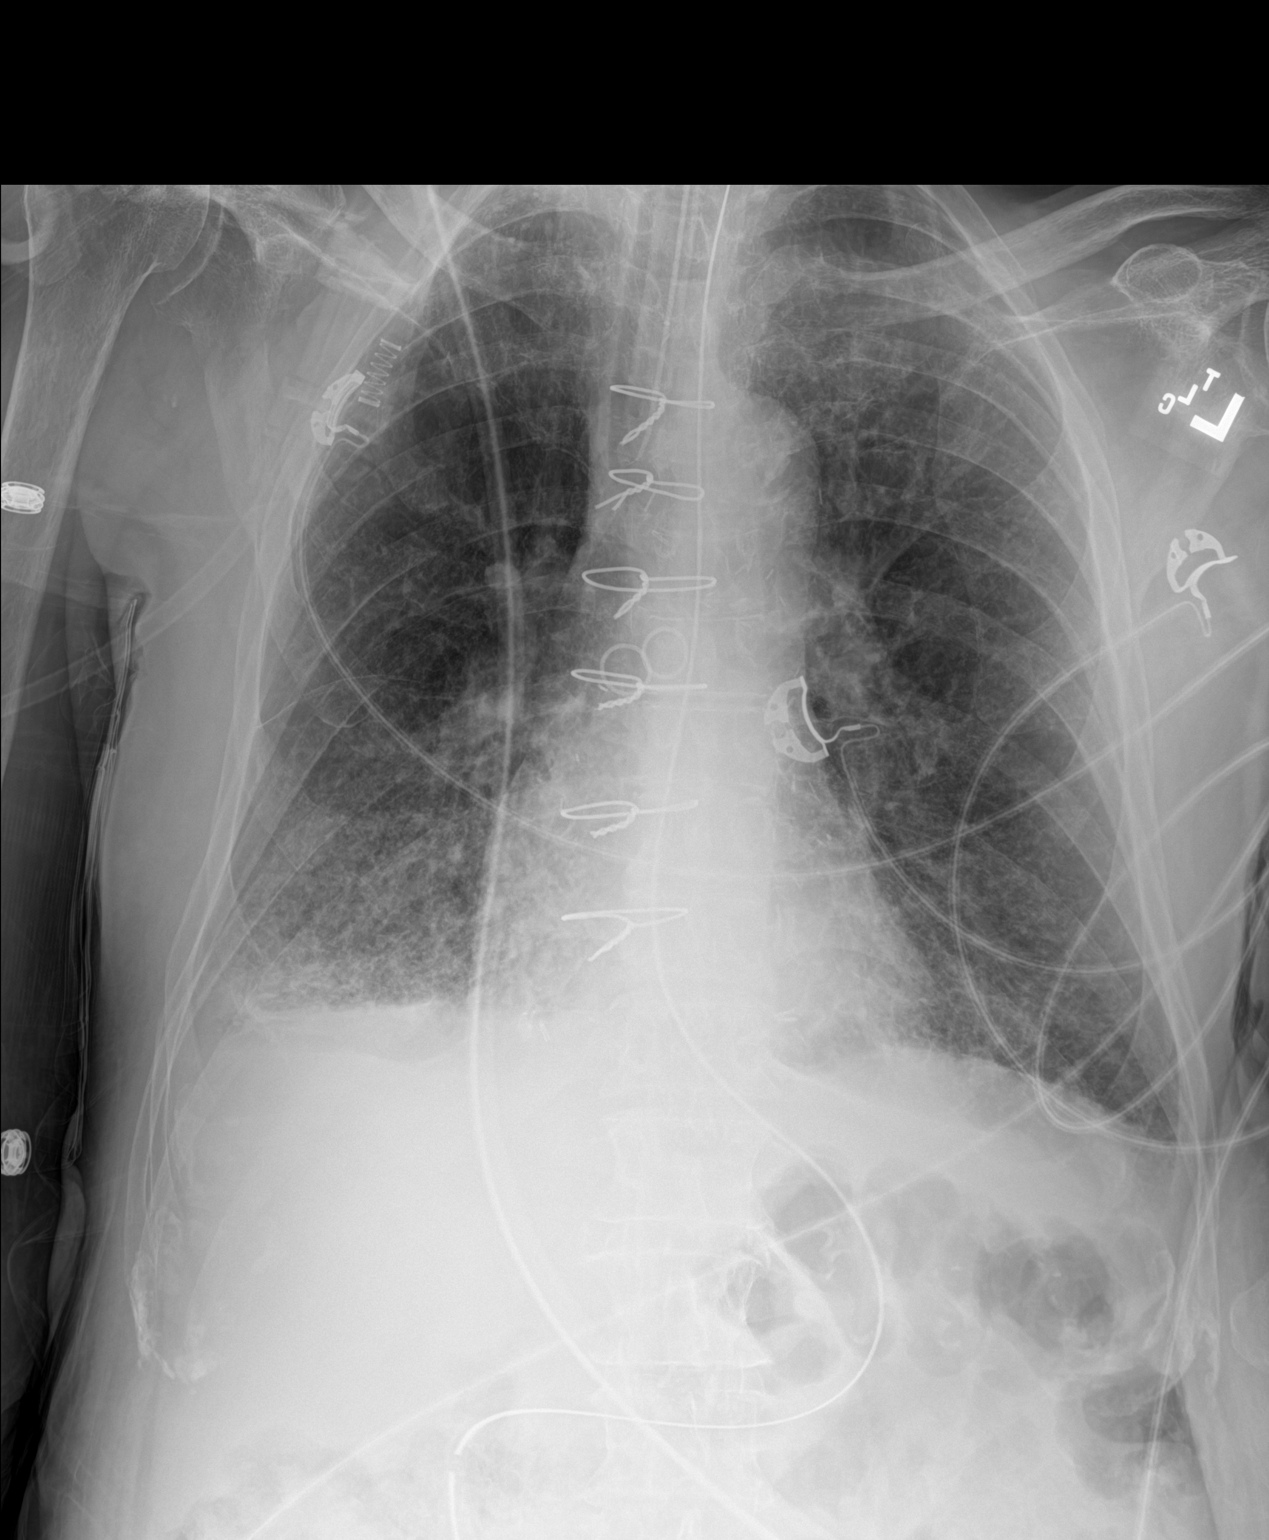

[1 of 1 positions shown; findings below may reference images not displayed]

FINDINGS: The heart size and mediastinal contours are within normal limits.
Endotracheal and nasogastric tubes are in grossly good position.
Status post coronary bypass graft. Stable bibasilar opacities are
noted, right greater than left, concerning for atelectasis or
possibly infiltrate. Small right pleural effusion is noted. The
visualized skeletal structures are unremarkable.
IMPRESSION: Stable bibasilar opacities are noted, right greater than left,
concerning for atelectasis or possibly infiltrates. Small right
pleural effusion is noted. Stable support apparatus.

## 2021-12-22 MED ORDER — HEPARIN (PORCINE) 25000 UT/250ML-% IV SOLN
1150.0000 [IU]/h | INTRAVENOUS | Status: DC
  Administered 2021-12-22: 800 [IU]/h via INTRAVENOUS
  Administered 2021-12-23: 900 [IU]/h via INTRAVENOUS
  Administered 2021-12-24: 1100 [IU]/h via INTRAVENOUS
  Administered 2021-12-25: 1250 [IU]/h via INTRAVENOUS
  Administered 2021-12-26: 1150 [IU]/h via INTRAVENOUS
  Filled 2021-12-22 (×6): qty 250

## 2021-12-22 MED ORDER — POTASSIUM CHLORIDE 20 MEQ PO PACK
20.0000 meq | PACK | Freq: Once | ORAL | Status: AC
  Administered 2021-12-22: 20 meq
  Filled 2021-12-22: qty 1

## 2021-12-22 MED ORDER — PERFLUTREN LIPID MICROSPHERE
1.0000 mL | INTRAVENOUS | Status: AC | PRN
  Administered 2021-12-22: 2 mL via INTRAVENOUS
  Filled 2021-12-22: qty 10

## 2021-12-22 MED ORDER — SODIUM PHOSPHATES 45 MMOLE/15ML IV SOLN
45.0000 mmol | Freq: Once | INTRAVENOUS | Status: AC
  Administered 2021-12-23: 45 mmol via INTRAVENOUS
  Filled 2021-12-22: qty 15

## 2021-12-22 MED ORDER — INSULIN ASPART 100 UNIT/ML IJ SOLN
3.0000 [IU] | INTRAMUSCULAR | Status: DC
  Administered 2021-12-22 – 2021-12-23 (×7): 3 [IU] via SUBCUTANEOUS

## 2021-12-22 MED ORDER — INSULIN ASPART 100 UNIT/ML IJ SOLN
0.0000 [IU] | INTRAMUSCULAR | Status: DC
  Administered 2021-12-22: 8 [IU] via SUBCUTANEOUS
  Administered 2021-12-22: 3 [IU] via SUBCUTANEOUS
  Administered 2021-12-22 – 2021-12-23 (×4): 5 [IU] via SUBCUTANEOUS

## 2021-12-22 MED ORDER — MAGNESIUM SULFATE 2 GM/50ML IV SOLN: 2.0000 g | Freq: Once | INTRAVENOUS | Status: DC

## 2021-12-22 NOTE — Progress Notes (Signed)
VASCULAR LAB ? ? ? ?Carotid duplex has been performed. ? ?See CV proc for preliminary results. ? ?Gave verbal report to Dr. Erlinda Hong ? ?Mauro Kaufmann, [facility], RVT ?12/22/2021, 3:12 PM ? ?

## 2021-12-22 NOTE — Progress Notes (Signed)
PT Cancellation Note ? ?Patient Details ?Name: Reginald Ayers ?MRN: [id number] ?DOB: [birth year] ? ? ?Cancelled Treatment:    Reason Eval/Treat Not Completed: (P) Medical issues which prohibited therapy Pt BP and RR elevated. Plan for extubation today (questionable) PT will follow back this afternoon for appropriateness.  ? ?Mauricia Mertens B. Beverely Risen PT, DPT ?Acute Rehabilitation Services ?Please use secure chat or  ?Call Office [phone] ? ? ? ?Elon Alas Fleet ?12/22/2021, 8:54 AM ? ? ?

## 2021-12-22 NOTE — Progress Notes (Signed)
ANTICOAGULATION CONSULT NOTE ? ?Pharmacy Consult for heparin ?Indication: atrial fibrillation ? ?Allergies  ?Allergen Reactions  ? Actos [Pioglitazone] Shortness Of Breath  ?  Leg swelling   ? Atorvastatin   ?  Other reaction(s): leg pain  ? Lisinopril Cough  ? ? ?Patient Measurements: ?Height: 5\' 3"  (160 cm) ?Weight: 51.2 kg (112 lb 14 oz) ?IBW/kg (Calculated) : 56.9 ?Heparin Dosing Weight: 51.2 kg ? ?Vital Signs: ?Temp: 99 ?F (37.2 ?C) (05/01 2000) ?Temp Source: Axillary (05/01 2000) ?BP: 144/67 (05/01 2200) ?Pulse Rate: 133 (05/01 2200) ? ?Labs: ?Recent Labs  ?  12/20/21 ?1834 12/20/21 ?2036 12/21/21 ?0103 12/21/21 ?0105 12/21/21 ?[id number] 12/21/21 ?1202 12/21/21 ?1233 12/21/21 ?1512 12/22/21 ?0130 12/22/21 ?1107 12/22/21 ?2135  ?HGB 10.6*   < >  --    < > 9.9* 8.8*  --   --  9.7*  --   --   ?HCT 33.6*   < >  --    < > 32.1* 26.0*  --   --  29.6*  --   --   ?PLT 292  --   --   --  244  --   --   --  228  --   --   ?HEPARINUNFRC  --   --   --   --   --   --   --   --   --   --  0.24*  ?CREATININE 2.16*  2.10*  --  2.13*  --  2.03*  --   --   --  1.82*  --   --   ?TROPONINIHS  --   --   --   --   --   --  881* 736*  --  432*  --   ? < > = values in this interval not displayed.  ? ? ? ?Estimated Creatinine Clearance: 26.6 mL/min (A) (by C-G formula based on SCr of 1.82 mg/dL (H)). ? ? ?Medical History: ?Past Medical History:  ?Diagnosis Date  ? Carotid artery disease ([place])   ? L-ICA 100%, mod R-ICA dz  ? COPD (chronic obstructive pulmonary disease) ([place])   ? Coronary artery disease   ? s/p CABG February 2010 by Dr. Merilynn Finland  ? Diabetes ([place])   ? Emphysema lung ([place])   ? Hyperlipidemia   ? Hypertension   ? Peripheral arterial disease ([place])   ? post left common iliac and right SFA stenting remotely  ? Tobacco abuse   ?    ? ? ?Assessment: ?71 yo male with afib. Patient with new stroke. Pharmacy consulted to start heparin drip with low goal and no bolus per CVA protocol. No prior AC noted PTA. ? ?PM update -  Heparin level low at 0.24. CBC stable. No bleeding or issues with infusion per discussion with RN. ? ? ?Goal of Therapy:  ?Heparin level 0.3-0.5 units/ml ?Monitor platelets by anticoagulation protocol: Yes ?  ?Plan:  ?Increase heparin drip to 900 units/hr, no bolus ?Check heparin level in 8 hours ?Monitor daily CBC, s/sx bleeding ? ? ?Arturo Morton, PharmD, BCPS ?Please check AMION for all [facility] contact numbers ?Clinical Pharmacist ?12/22/2021 10:25 PM ?

## 2021-12-22 NOTE — Progress Notes (Addendum)
? ?NAME:  Reginald Ayers, MRN:  [id number], DOB:  [birth year], LOS: 2 ?ADMISSION DATE:  12/20/2021, CONSULTATION DATE:  12/20/21 ?REFERRING MD:  EDP, CHIEF COMPLAINT:  Respiratory Failure  ? ?History of Present Illness:  ?Patient is a 71 year old man who presented with increasing shortness of breath, weakness that worsened requiring ED visit. He has hx of COPD, HTN, HLD, CAD, PAD, and tobacco use disorder. Patient feeling off-baseline for a few weeks with fatigue, coughing, and shortness of breath. He presented to the ED via EMS and continued to worsen and required intubation and mechanical ventilation for respiratory failure. Patient had episode of vomiting yesterday.  ?  ?Pertinent  Medical History  ? ? Carotid artery disease ([place])    ?  L-ICA 100%, mod R-ICA dz  ? COPD (chronic obstructive pulmonary disease) ([place])    ? Coronary artery disease    ?  s/p CABG February 2010 by Dr. Merilynn Finland  ? Diabetes ([place])    ? Emphysema lung ([place])    ? Hyperlipidemia    ? Hypertension    ? Peripheral arterial disease ([place])    ?  post left common iliac and right SFA stenting remotely  ? Tobacco abuse   ? ?Significant Hospital Events: ?Including procedures, antibiotic start and stop dates in addition to other pertinent events   ?04/29-Intubated and admitted to the ICU. >> A fib and elevated troponins ?4/30 MRI Brain >> acute stroke in the left hemisphere, Heparin stopped ?5/1 MRA Head, unable to order CTA because of renal function >>  ?Interim History / Subjective:  ?Intubated and sedated. Opens eyes, but does not follow commands  ?Failed weaning attempt 5/1 am 2/2 increased RR.  ?Remains on Propofol>> when discontinued RR high ?T Max 101.1 ?Na 142/ K 3.8/ BUN 46/ Creatinine 1.82 ( 2.03)/ Mag 2.3/ Calcium 8/ Phos 1.1 ?WBC 14.0/ HGB 9.7/ Platelets 228 K ?Lo Lactate checked 5/1 No troponin checked 5/1, No CXR ?Echo ordered 12/21/2021 has not been done ?For MRI head today per neuro  ?+ 2775 , 1820 UO last 24 hours ? ?   ? ?Objective   ?Blood pressure (!) 146/70, pulse 100, temperature 99 ?F (37.2 ?C), temperature source Axillary, resp. rate (!) 28, height 5\' 3"  (1.6 m), weight 51.2 kg, SpO2 100 %. ?   ?Vent Mode: PRVC ?FiO2 (%):  [40 %-100 %] 40 % ?Set Rate:  [22 bmp] 22 bmp ?Vt Set:  [450 mL] 450 mL ?PEEP:  [5 cmH20] 5 cmH20 ?Pressure Support:  [8 cmH20] 8 cmH20 ?Plateau Pressure:  [16 cmH20-25 cmH20] 17 cmH20  ? ?Intake/Output Summary (Last 24 hours) at 12/22/2021 1011 ?Last data filed at 12/22/2021 0800 ?Gross per 24 hour  ?Intake 2168.38 ml  ?Output 1680 ml  ?Net 488.38 ml  ? ?Filed Weights  ? 12/20/21 1055 12/21/21 0400  ?Weight: 48.5 kg 51.2 kg  ? ?Examination: ?General: sedated and intubated.  ?HENT: Intubated, NCAT, No JVD, No LAD ?Lungs: Bilateral chest excursion. Few scattered Rhonchi, diminished per bases ?Cardiovascular: irregularly irregular, 2+ radial pulses, Atrial Fibrillation per tele ?Abdomen: soft, NT, ND, bowel sounds present, Body mass index is 20 kg/m?. ?Extremities: No obvious deformities, No asymmetry, has bandages on bilateral LE due to dried skin lesions. Foam padding on heels and feet to protect from pressure, Right arm is edematous and tight  ?Neuro: Moving both arms,  he is moving his lower extremities at intervals but not left. He is  following simple commands  at intervals, throws legs out of  the bed at intervals  ?GU: catheter in place with light colored urine.  ? ? ?Consults  ?Neuro ?Resolved Hospital Problem list   ? ?Assessment & Plan:  ?Sepsis due to pneumonia [facility]) ?Acute respiratory failure with hypoxia ([place]) ?Febrile with leukocytosis, WBC slowly down trending ?Received 2.6 L of fluid  in ED ?-Due to community-acquired pneumonia.  PIRO score predicts moderate to high 28-day mortality.  ?- Failed wean 5/1  due to increased respiratory rate ? -Ceftriaxone and azithromycin day 3.>> Consider 5 days of ABX ?- Steroids hydrocortisone 100 mg BID for pneumonia for 5 days. ?- Full lung protective  ventilation. Correct acidosis, maintain Pplat <30.  ?- -up postintubation ABG and adjust FiO2 PEEP accordingly. ?- CXR today and prn  ? ?AKI (acute kidney injury) ([place]) ?Acidosis, metabolic ?Hypokalemia ?Hypomagnesemia ?Improving. Creatinine 1.82  this am, baseline 1.42.  Initial pH was 7.0. This am improved to 7.29. Patient is s/p 2 amp push of bicarb and drip  overnight 4/29, drip DC'd 4/29 ?- Trend BMET ?- Trend UO ?- Avoid nephrotoxic agents and renally dose meds.  ?- Replete electrolytes as needed for  ?- goal of 2  for Mag and and 4 for K ? ?  ?Concern for Stroke ?New onset Atrial Fibrillation ?Hx of CABG Feb 2010 ?Hx HTN, HLD, CAD, PAD ?Has hx of CABG and hx of HLD, CAD, and PAD. New onset a fib most likely in setting of acute illness. Concern for stroke with intial presentation. MRI brain 4/29 showed acute stroke in the left hemisphere, heparin was stopped per Neuro, plavix continued. MRA  ordered for 5/1 as need to look at  vasculature, and CTA not possibility due to renal function  ?-Trend  troponin,  MR A per neuri and echocardiogram ( Ordered 4/30, not yet done) ?- Continue ASA, home lipitor, plavix. ?-Will continue home metoprolol and start IV heparin given atrial fibrillation ( OK per neuro to do so)  ?- Follow for neuro changes ? ?Diabetes ([place]) ?Type 2 DM with hyperglycemia. On orals at home.  ? - Basal and moderate sliding scale. Will transition to resistant if he continues to be hyperglycemic given steroid use.  ?- Will add TF coverage ( 3 units Q 4) as CBG's have remained above 250  ?  ?COPD (chronic obstructive pulmonary disease) ([place]) ?Emphysematous change on CT but no recorded PFT's  ?-Continue inhaled bronchodilators  ? ?Severe protein-energy malnutrition ([place]) ?Appears cachectic with decrease muscle mass ? - Continue tube feeds per dietary consult. >> tolerating well ? ?Right arm edema ?Plan ?Doppler radial  pulse  ?Check Cap refill ? ?PCCM APP time 35 minutes ? ?Magdalen Spatz, MSN,  AGACNP-BC ?[facility] ?See Amion for personal pager ?PCCM on call pager [phone]  ?12/22/2021 ?11:00 AM ? ?  ?Best Practice (right click and "Reselect all SmartList Selections" daily)  ?Diet/type: tubefeeds ?DVT prophylaxis: systemic heparin ?GI prophylaxis: H2B ?Lines: N/A ?Foley:  Yes, and it is still needed ?Continuous: Propofol ?Code Status:  full code ?Last date of multidisciplinary goals of care discussion [ No answer 5/1 when called niece to update on condition) ] ? ? ?  ? ?  ?

## 2021-12-22 NOTE — Progress Notes (Signed)
Troponin is down trending to 432 today from 736 and 881 on 4/30.  ? ?

## 2021-12-22 NOTE — Progress Notes (Signed)
OT Cancellation Note ? ?Patient Details ?Name: ESLEY BROOKING ?MRN: [id number] ?DOB: 01-03-1951 ? ? ?Cancelled Treatment:    Reason Eval/Treat Not Completed: Patient not medically ready Pt BP and RR elevated. Plan for extubation today (questionable).  Will follow and see as able.  ? ?Barry Brunner, OT ?Acute Rehabilitation Services ?Pager [phone] ?Office [phone] ? ? ?Chancy Milroy ?12/22/2021, 10:53 AM ?

## 2021-12-22 NOTE — Progress Notes (Signed)
ANTICOAGULATION CONSULT NOTE - Initial Consult ? ?Pharmacy Consult for heparin ?Indication: atrial fibrillation ? ?Allergies  ?Allergen Reactions  ? Actos [Pioglitazone] Shortness Of Breath  ?  Leg swelling   ? Atorvastatin   ?  Other reaction(s): leg pain  ? Lisinopril Cough  ? ? ?Patient Measurements: ?Height: 5\' 3"  (160 cm) ?Weight: 51.2 kg (112 lb 14 oz) ?IBW/kg (Calculated) : 56.9 ?Heparin Dosing Weight: 51.2 ? ?Vital Signs: ?Temp: 99 ?F (37.2 ?C) (05/01 0744) ?Temp Source: Axillary (05/01 0744) ?BP: 146/70 (05/01 [id number]) ?Pulse Rate: 100 (05/01 0808) ? ?Labs: ?Recent Labs  ?  12/20/21 ?1834 12/20/21 ?2036 12/21/21 ?0103 12/21/21 ?0105 12/21/21 ?[id number] 12/21/21 ?1202 12/21/21 ?1233 12/21/21 ?1512 12/22/21 ?0130  ?HGB 10.6*   < >  --    < > 9.9* 8.8*  --   --  9.7*  ?HCT 33.6*   < >  --    < > 32.1* 26.0*  --   --  29.6*  ?PLT 292  --   --   --  244  --   --   --  228  ?CREATININE 2.16*  2.10*  --  2.13*  --  2.03*  --   --   --  1.82*  ?TROPONINIHS  --   --   --   --   --   --  881* 736*  --   ? < > = values in this interval not displayed.  ? ? ? ?Estimated Creatinine Clearance: 26.6 mL/min (A) (by C-G formula based on SCr of 1.82 mg/dL (H)). ? ? ?Medical History: ?Past Medical History:  ?Diagnosis Date  ? Carotid artery disease ([place])   ? L-ICA 100%, mod R-ICA dz  ? COPD (chronic obstructive pulmonary disease) ([place])   ? Coronary artery disease   ? s/p CABG February 2010 by Dr. Merilynn Finland  ? Diabetes ([place])   ? Emphysema lung ([place])   ? Hyperlipidemia   ? Hypertension   ? Peripheral arterial disease ([place])   ? post left common iliac and right SFA stenting remotely  ? Tobacco abuse   ?    ? ? ?Assessment: ?71 yo male with afib. Patient with new stroke. Pharmacy consulted to start heparin drip with low goal. No prior AC noted PTA.  ? ? ?Goal of Therapy:  ?Heparin level 0.3-0.5 units/ml ?Monitor platelets by anticoagulation protocol: Yes ?  ?Plan:  ?Restart Heparin drip at 800 units/hr, no bolus ?Heparin level in  8 hours ?Daily Heparin level and CBC ? ?Alanda Slim, PharmD, FCCM ?Clinical Pharmacist ?Please see AMION for all Pharmacists' Contact Phone Numbers ?12/22/2021, 10:22 AM  ? ? ? ?

## 2021-12-22 NOTE — Progress Notes (Addendum)
STROKE TEAM PROGRESS NOTE  ? ?INTERVAL HISTORY ?4/30-found to be in A-fib, IV heparin okay from neuro stand point, can continue plavix.  ?Still need vessel imaging. Carotid doppler and MRA head ordered.  ?Currently intubated. Coughs and opens eyes to noxious stimuli. Currently sedated on propofol, becomes tachycardic with stimulation as well.  ? ?Vitals:  ? 12/22/21 0630 12/22/21 0638 12/22/21 0645 12/22/21 0700  ?BP:  (!) 103/59 117/63 116/67  ?Pulse: 96 97 98 98  ?Resp: (!) 25 (!) 26 (!) 25 (!) 24  ?Temp:      ?TempSrc:      ?SpO2: 100% 100% 100% 100%  ?Weight:      ?Height:      ? ?CBC:  ?Recent Labs  ?Lab 12/20/21 ?1116 12/20/21 ?1127 12/21/21 ?0610 12/21/21 ?1202 12/22/21 ?0130  ?WBC 18.8*   < > 14.2*  --  14.0*  ?NEUTROABS 16.6*  --   --   --   --   ?HGB 10.4*   < > 9.9* 8.8* 9.7*  ?HCT 33.5*   < > 32.1* 26.0* 29.6*  ?MCV 95.7   < > 97.3  --  90.8  ?PLT 284   < > 244  --  228  ? < > = values in this interval not displayed.  ? ?Basic Metabolic Panel:  ?Recent Labs  ?Lab 12/21/21 ?0610 12/21/21 ?1202 12/22/21 ?0130  ?NA 140 141 142  ?K 2.8* 2.9* 3.8  ?CL 113*  --  112*  ?CO2 14*  --  20*  ?GLUCOSE 259*  --  209*  ?BUN 48*  --  46*  ?CREATININE 2.03*  --  1.82*  ?CALCIUM 8.0*  --  8.0*  ?MG 1.7  --  2.3  ?PHOS 3.1  --  1.1*  ? ?Lipid Panel:  ?Recent Labs  ?Lab 12/21/21 ?1240  ?CHOL 85  ?TRIG 160*  ?HDL 32*  ?CHOLHDL 2.7  ?VLDL 32  ?LDLCALC 21  ? ?HgbA1c:  ?Recent Labs  ?Lab 12/21/21 ?2246  ?HGBA1C 8.1*  ? ?Urine Drug Screen: No results for input(s): LABOPIA, COCAINSCRNUR, LABBENZ, AMPHETMU, THCU, LABBARB in the last 168 hours.  ?Alcohol Level No results for input(s): ETH in the last 168 hours. ? ?IMAGING past 24 hours ?MR BRAIN WO CONTRAST ? ?Addendum Date: 12/21/2021   ?ADDENDUM REPORT: 12/21/2021 11:41 ADDENDUM: Study discussed by telephone with Dr. Vassie Loll on 12/21/2021 at 1133 hours. Electronically Signed   By: Odessa Fleming M.D.   On: 12/21/2021 11:41  ? ?Result Date: 12/21/2021 ?CLINICAL DATA:  71 year old male with  altered mental status. EXAM: MRI HEAD WITHOUT CONTRAST TECHNIQUE: Multiplanar, multiecho pulse sequences of the brain and surrounding structures were obtained without intravenous contrast. COMPARISON:  Head CT yesterday and earlier. FINDINGS: Brain: Small area of linear restricted diffusion in the white matter of the left temporal stem on series 2, images 24 and 25. No other convincing restricted diffusion. No midline shift, mass effect, evidence of mass lesion, ventriculomegaly, extra-axial collection or acute intracranial hemorrhage. Cervicomedullary junction and pituitary are within normal limits. Small chronic infarct right inferior cerebellum. Small area of chronic appearing cortical encephalomalacia in the anterior left superior frontal gyrus on series 6, image 26 and left middle frontal gyrus on the same image. Asymmetric additional patchy left cerebral white matter T2 and FLAIR hyperintensity. Mild to moderate T2 and FLAIR heterogeneity in the pons. No other cortical encephalomalacia identified. No definite chronic cerebral blood products. Deep gray nuclei remain within normal limits for age. Vascular: Loss of the cervical left  ICA and left ICA siphon flow void with evidence of reconstituted flow at the left ICA terminus. a asymmetric FLAIR hyperintensity of left MCA branches in the left hemisphere. Other Major intracranial vascular flow voids are preserved. Skull and upper cervical spine: Negative visible cervical spine. Visualized bone marrow signal is within normal limits. Sinuses/Orbits: Previous paranasal sinus surgery with generalized sinus mucosal thickening. Retained secretions in the visible pharynx. Negative orbits with postoperative changes to both globes. Other: Mild bilateral mastoid effusions. IMPRESSION: 1. Left ICA with poor flow or occlusion in the neck and at the skull base. Evidence of reconstituted flow at the Left ICA terminus by MRI, but also asymmetric decreased flow in Left MCA  branches. 2. However, only trace acute ischemia is identified in the Left MCA territory (left temporal lobe white matter) with chronic cortical infarcts in the left middle and superior frontal gyri. 3. Evidence of some underlying chronic small vessel disease including in the pons. Electronically Signed: By: Odessa FlemingH  Hall M.D. On: 12/21/2021 11:20   ? ?PHYSICAL EXAM ?Constitutional: Appears chronically ill, cachectic ?Cardiovascular: Normal rate and regular rhythm at the time of my eval.  Perfusing extremities well ?Respiratory: Mechanically ventilated ?  ?Neuro: ?Mental Status: ?Does not open eyes spontaneously, to voice or noxious stimulation, but does grimace ?Weakly squeezes right hand to command, does not follow commands ?Cranial Nerves: ?II: No blink to threat. Pupils are equal, round, and reactive to light.   ?III,IV, VI/VIII: EOMI to orienting to examiner bilaterally ?V/VII: Facial sensation is symmetric to eyelash brush ?VIII: Response to voice ?X/XI: Intact cough/gag per nursing ?XII: Unable to assess tongue protrusion secondary to patient's mental status  ?Motor/Sensory: ?Tone is normal.  Bulk is low throughout.  He withdraws weakly in the bilateral upper extremities, 2/5, 1/5 in the bilateral lower extremities ? ?ASSESSMENT/PLAN ?Reginald Ayers is a 71 y.o. male with history of bilateral carotid artery stenosis (total occlusion of the left, moderate stenosis of the right), hypertension, hyperlipidemia, prior tobacco abuse, diabetes, coronary artery disease s/p CABG, peripheral vascular disease s/p stents, COPD presenting with generalized weakness over the past 3 weeks.  On admission 4/29, he was found to be septic secondary to a community-acquired pneumonia, he was intubated and started on antibiotics.  He developed A-fib with RVR, became hypotensive and required vasopressors.  He has since converted back into normal sinus rhythm and is no longer requiring pressors ? ?Stroke: Punctate left temporal infarct  likely secondary left ICA chronic occlusion with hypoperfusion. Afib as etiology possible but less likely given left ICA occluded. ?MRI - Left ICA with poor flow or occlusion in the neck and at the skull base. Evidence of reconstituted flow at the Left ICA terminus by MRI, but also asymmetric decreased flow in Left MCA branches. However, only trace acute ischemia is identified in the Left MCA territory (left temporal lobe white matter) with chronic cortical infarcts in the left middle and superior frontal gyri. Evidence of some underlying chronic small vessel disease including in the pons. ?MRA- pending ?Carotid Doppler left ICA chronically occluded, right ICA 80 to 90% stenosis ?2D Echo EF 50 to 55% ?LDL 21 ?HgbA1c 8.1 ?VTE prophylaxis - Heparin IV ?clopidogrel 75 mg daily prior to admission, now on clopidogrel 75 mg daily and heparin IV.  Received one dose of ASA 325.  ?Therapy recommendations:  pending ?Disposition:  pending ? ?A-fib RVR, new diagnosis ?Now converted to NSR ?On heparin IV ?Further elevation per primary team ? ?Carotid stenosis/occlusion ?Left ICA chronic  occlusion ?Right ICA stenosis 07/2019 60 to 79% stenosis.  This admission carotid Doppler 80 to 99% stenosis. ?May consider right carotid revascularization in the future. ?On Plavix and heparin IV now ? ?Elevated troponin ?Trop 881 -> 736 ?On Heparin IV and plavix ?CCM on board ? ?Sepsis d/t PNA ?On rocephin and azithromycin ?Febrile with leukocytosis  ?WBC 14.2->14.0 ?Tmax 99.7 ? ?Respiratory failure with hypoxia ?Intubated, vent management per CCM ?Failed wean 5/1 ?Propofol for sedation ? ?AKI ?Cr baseline 1.42 ?Cr 2.03-> 1.82 ?Hold off on CTA, will do MRA head with carotid doppler for vessel imaging ?Bicarb drip d/c'd ? ?Hypotension ?Related to sedation versus sepsis and A-fib with RVR ?Off Norepinephrine now ? ?Diabetes type II Uncontrolled ?Home meds: Jardiance, metformin ?HgbA1c 8.1, goal < 7.0 ?CBGs ?SSI ? ?Other Stroke Risk  Factors ?Hypertension ?Home med: Losartan, metoprolol succinate ?Advanced Age >/= 55  ?Former cigarette smoker ?Coronary artery disease ?S/p CABG 09/2008  ?Congestive heart failure ?Home meds: Lasix ?Echo pending  ? ?Other

## 2021-12-22 NOTE — Progress Notes (Signed)
PT Cancellation Note ? ?Patient Details ?Name: Reginald Ayers ?MRN: [id number] ?DOB: 12-12-1950 ? ? ?Cancelled Treatment:    Reason Eval/Treat Not Completed: (P) Patient not medically ready Pt remains on pressors and failed weaning attempts this morningdue to increased RR.  PT will follow back for Evaluation tomorrow. ? ?Desiree Fleming B. Beverely Risen PT, DPT ?Acute Rehabilitation Services ?Please use secure chat or  ?Call Office [phone] ?  ? ? ?Reginald Ayers ?12/22/2021, 1:41 PM ? ? ?

## 2021-12-22 NOTE — Progress Notes (Signed)
eLink Physician-Brief Progress Note ?Patient Name: Reginald Ayers ?DOB: 1951-02-02 ?MRN: [id number] ? ? ?Date of Service ? 12/22/2021  ?HPI/Events of Note ? PHOS 1.1  ?eICU Interventions ? Phosphorus replaced per E-link electrolyte replacement protocol.  ? ? ? ?  ? ?Migdalia Dk ?12/22/2021, 10:53 PM ?

## 2021-12-23 DIAGNOSIS — I63132 Cerebral infarction due to embolism of left carotid artery: Secondary | ICD-10-CM | POA: Diagnosis not present

## 2021-12-23 DIAGNOSIS — E872 Acidosis, unspecified: Secondary | ICD-10-CM

## 2021-12-23 DIAGNOSIS — I6523 Occlusion and stenosis of bilateral carotid arteries: Secondary | ICD-10-CM

## 2021-12-23 DIAGNOSIS — J9601 Acute respiratory failure with hypoxia: Secondary | ICD-10-CM | POA: Diagnosis not present

## 2021-12-23 DIAGNOSIS — J189 Pneumonia, unspecified organism: Secondary | ICD-10-CM | POA: Diagnosis not present

## 2021-12-23 DIAGNOSIS — N179 Acute kidney failure, unspecified: Secondary | ICD-10-CM | POA: Diagnosis not present

## 2021-12-23 LAB — TROPONIN I (HIGH SENSITIVITY): Troponin I (High Sensitivity): 244 ng/L (ref <18)

## 2021-12-23 LAB — BASIC METABOLIC PANEL
Anion gap: 11 (ref 5–15)
Anion gap: 13 (ref 5–15)
BUN: 45 mg/dL — ABNORMAL HIGH (ref 8–23)
BUN: 42 mg/dL — ABNORMAL HIGH (ref 8–23)
CO2: 23 mmol/L (ref 22–32)
CO2: 22 mmol/L (ref 22–32)
Calcium: 7.6 mg/dL — ABNORMAL LOW (ref 8.9–10.3)
Calcium: 7.6 mg/dL — ABNORMAL LOW (ref 8.9–10.3)
Chloride: 117 mmol/L — ABNORMAL HIGH (ref 98–111)
Chloride: 117 mmol/L — ABNORMAL HIGH (ref 98–111)
Creatinine, Ser: 1.7 mg/dL — ABNORMAL HIGH (ref 0.61–1.24)
Creatinine, Ser: 1.62 mg/dL — ABNORMAL HIGH (ref 0.61–1.24)
GFR, Estimated: 42 mL/min — ABNORMAL LOW (ref >60)
GFR, Estimated: 45 mL/min — ABNORMAL LOW (ref >60)
Glucose, Bld: 218 mg/dL — ABNORMAL HIGH (ref 70–99)
Glucose, Bld: 157 mg/dL — ABNORMAL HIGH (ref 70–99)
Potassium: 2.8 mmol/L — ABNORMAL LOW (ref 3.5–5.1)
Potassium: 4.3 mmol/L (ref 3.5–5.1)
Sodium: 151 mmol/L — ABNORMAL HIGH (ref 135–145)
Sodium: 152 mmol/L — ABNORMAL HIGH (ref 135–145)

## 2021-12-23 LAB — COMPREHENSIVE METABOLIC PANEL
ALT: 23 U/L (ref 0–44)
AST: 29 U/L (ref 15–41)
Albumin: 2 g/dL — ABNORMAL LOW (ref 3.5–5.0)
Alkaline Phosphatase: 78 U/L (ref 38–126)
Anion gap: 12 (ref 5–15)
BUN: 46 mg/dL — ABNORMAL HIGH (ref 8–23)
CO2: 21 mmol/L — ABNORMAL LOW (ref 22–32)
Calcium: 7.7 mg/dL — ABNORMAL LOW (ref 8.9–10.3)
Chloride: 118 mmol/L — ABNORMAL HIGH (ref 98–111)
Creatinine, Ser: 1.71 mg/dL — ABNORMAL HIGH (ref 0.61–1.24)
GFR, Estimated: 42 mL/min — ABNORMAL LOW (ref >60)
Glucose, Bld: 263 mg/dL — ABNORMAL HIGH (ref 70–99)
Potassium: 3 mmol/L — ABNORMAL LOW (ref 3.5–5.1)
Sodium: 151 mmol/L — ABNORMAL HIGH (ref 135–145)
Total Bilirubin: 0.3 mg/dL (ref 0.3–1.2)
Total Protein: 5.4 g/dL — ABNORMAL LOW (ref 6.5–8.1)

## 2021-12-23 LAB — CULTURE, RESPIRATORY W GRAM STAIN: Culture: NORMAL

## 2021-12-23 LAB — CBC WITH DIFFERENTIAL/PLATELET
Abs Immature Granulocytes: 0.08 10*3/uL — ABNORMAL HIGH (ref 0.00–0.07)
Basophils Absolute: 0 10*3/uL (ref 0.0–0.1)
Basophils Relative: 0 %
Eosinophils Absolute: 0 10*3/uL (ref 0.0–0.5)
Eosinophils Relative: 0 %
HCT: 26.8 % — ABNORMAL LOW (ref 39.0–52.0)
Hemoglobin: 9 g/dL — ABNORMAL LOW (ref 13.0–17.0)
Immature Granulocytes: 1 %
Lymphocytes Relative: 6 %
Lymphs Abs: 0.7 10*3/uL (ref 0.7–4.0)
MCH: 30.5 pg (ref 26.0–34.0)
MCHC: 33.6 g/dL (ref 30.0–36.0)
MCV: 90.8 fL (ref 80.0–100.0)
Monocytes Absolute: 0.5 10*3/uL (ref 0.1–1.0)
Monocytes Relative: 4 %
Neutro Abs: 9.9 10*3/uL — ABNORMAL HIGH (ref 1.7–7.7)
Neutrophils Relative %: 89 %
Platelets: 211 10*3/uL (ref 150–400)
RBC: 2.95 MIL/uL — ABNORMAL LOW (ref 4.22–5.81)
RDW: 15.3 % (ref 11.5–15.5)
WBC: 11.2 10*3/uL — ABNORMAL HIGH (ref 4.0–10.5)
nRBC: 0 % (ref 0.0–0.2)

## 2021-12-23 LAB — PHOSPHORUS: Phosphorus: 5.5 mg/dL — ABNORMAL HIGH (ref 2.5–4.6)

## 2021-12-23 LAB — GLUCOSE, CAPILLARY
Glucose-Capillary: 138 mg/dL — ABNORMAL HIGH (ref 70–99)
Glucose-Capillary: 146 mg/dL — ABNORMAL HIGH (ref 70–99)
Glucose-Capillary: 164 mg/dL — ABNORMAL HIGH (ref 70–99)
Glucose-Capillary: 179 mg/dL — ABNORMAL HIGH (ref 70–99)
Glucose-Capillary: 204 mg/dL — ABNORMAL HIGH (ref 70–99)
Glucose-Capillary: 215 mg/dL — ABNORMAL HIGH (ref 70–99)

## 2021-12-23 LAB — HEPARIN LEVEL (UNFRACTIONATED)
Heparin Unfractionated: 0.42 IU/mL (ref 0.30–0.70)
Heparin Unfractionated: 0.33 IU/mL (ref 0.30–0.70)

## 2021-12-23 LAB — MAGNESIUM: Magnesium: 2 mg/dL (ref 1.7–2.4)

## 2021-12-23 MED ORDER — POLYETHYLENE GLYCOL 3350 17 G PO PACK
17.0000 g | PACK | Freq: Every day | ORAL | Status: DC
  Administered 2021-12-23: 17 g
  Filled 2021-12-23: qty 1

## 2021-12-23 MED ORDER — POTASSIUM CHLORIDE 10 MEQ/100ML IV SOLN
10.0000 meq | INTRAVENOUS | Status: AC
  Administered 2021-12-23 (×4): 10 meq via INTRAVENOUS
  Filled 2021-12-23 (×4): qty 100

## 2021-12-23 MED ORDER — ORAL CARE MOUTH RINSE
15.0000 mL | Freq: Four times a day (QID) | OROMUCOSAL | Status: DC
Start: 2021-12-23 — End: 2021-12-31
  Administered 2021-12-23 – 2021-12-31 (×26): 15 mL via OROMUCOSAL

## 2021-12-23 MED ORDER — INSULIN ASPART 100 UNIT/ML IJ SOLN
0.0000 [IU] | INTRAMUSCULAR | Status: DC
  Administered 2021-12-23: 3 [IU] via SUBCUTANEOUS
  Administered 2021-12-23 – 2021-12-24 (×3): 4 [IU] via SUBCUTANEOUS
  Administered 2021-12-24: 3 [IU] via SUBCUTANEOUS
  Administered 2021-12-24: 7 [IU] via SUBCUTANEOUS
  Administered 2021-12-24: 3 [IU] via SUBCUTANEOUS

## 2021-12-23 MED ORDER — DOCUSATE SODIUM 50 MG/5ML PO LIQD
100.0000 mg | Freq: Two times a day (BID) | ORAL | Status: DC
  Administered 2021-12-23: 100 mg
  Filled 2021-12-23: qty 10

## 2021-12-23 MED ORDER — FREE WATER
100.0000 mL | Status: DC
  Administered 2021-12-23 – 2021-12-24 (×4): 100 mL

## 2021-12-23 MED ORDER — POTASSIUM CHLORIDE 20 MEQ PO PACK
60.0000 meq | PACK | Freq: Once | ORAL | Status: AC
  Administered 2021-12-23: 60 meq
  Filled 2021-12-23: qty 3

## 2021-12-23 MED ORDER — DEXTROSE 5 % IV SOLN: INTRAVENOUS | Status: DC

## 2021-12-23 NOTE — Evaluation (Signed)
Clinical/Bedside Swallow Evaluation ?Patient Details  ?Name: Reginald Ayers ?MRN: [id number] ?Date of Birth: 1951/02/21 ? ?Today's Date: 12/23/2021 ?Time: SLP Start Time (ACUTE ONLY): [id number] SLP Stop Time (ACUTE ONLY): 1703 ?SLP Time Calculation (min) (ACUTE ONLY): 18 min ? ?Past Medical History:  ?Past Medical History:  ?Diagnosis Date  ? Carotid artery disease ([place])   ? L-ICA 100%, mod R-ICA dz  ? COPD (chronic obstructive pulmonary disease) ([place])   ? Coronary artery disease   ? s/p CABG February 2010 by Dr. Merilynn Finland  ? Diabetes ([place])   ? Emphysema lung ([place])   ? Hyperlipidemia   ? Hypertension   ? Peripheral arterial disease ([place])   ? post left common iliac and right SFA stenting remotely  ? Tobacco abuse   ?    ? ?Past Surgical History:  ?Past Surgical History:  ?Procedure Laterality Date  ? ABDOMINAL AORTOGRAM N/A 04/20/2017  ? Procedure: ABDOMINAL AORTOGRAM;  Surgeon: Serafina Mitchell, MD;  Location: [facility];  Service: Cardiovascular;  Laterality: N/A;  ? ABDOMINAL AORTOGRAM W/LOWER EXTREMITY Right 12/08/2016  ? Procedure: Abdominal Aortogram w/Lower Extremity;  Surgeon: Serafina Mitchell, MD;  Location: [facility];  Service: Cardiovascular;  Laterality: Right;  ? ABDOMINAL AORTOGRAM W/LOWER EXTREMITY N/A 09/07/2017  ? Procedure: ABDOMINAL AORTOGRAM W/LOWER EXTREMITY;  Surgeon: Serafina Mitchell, MD;  Location: [facility];  Service: Cardiovascular;  Laterality: N/A;  ? CARDIAC CATHETERIZATION  2010  ? CAROTID ANGIOGRAM  2014  ? CORONARY ARTERY BYPASS GRAFT  2010  ? LIMA-LAD, Lrad-OM1, SVG-RI, SVG-AM-dRCA  ? DOPPLER ECHOCARDIOGRAPHY  2010  ? LOWER EXTREMITY ANGIOGRAM  11/2006  ? left common femoral endarterectomy and patch angioplasty:The mid right SFA was angioplastied with a 4 x 8  ? LOWER EXTREMITY ANGIOGRAM  2003  ? left common iliac artery stenting by Dr. Adora Fridge  ? LOWER EXTREMITY ANGIOGRAPHY Right 04/20/2017  ? Procedure: Lower Extremity Angiography;  Surgeon: Serafina Mitchell, MD;   Location: [facility];  Service: Cardiovascular;  Laterality: Right;  ? NM MYOVIEW LTD  2015  ? PERIPHERAL VASCULAR ATHERECTOMY Right 12/08/2016  ? Procedure: Peripheral Vascular Atherectomy;  Surgeon: Serafina Mitchell, MD;  Location: [facility];  Service: Cardiovascular;  Laterality: Right;  ? PERIPHERAL VASCULAR BALLOON ANGIOPLASTY Right 12/08/2016  ? Procedure: Peripheral Vascular Balloon Angioplasty;  Surgeon: Serafina Mitchell, MD;  Location: [facility];  Service: Cardiovascular;  Laterality: Right;  ? PERIPHERAL VASCULAR BALLOON ANGIOPLASTY Right 04/20/2017  ? Procedure: PERIPHERAL VASCULAR BALLOON ANGIOPLASTY;  Surgeon: Serafina Mitchell, MD;  Location: [facility];  Service: Cardiovascular;  Laterality: Right;  SFA  ? PERIPHERAL VASCULAR CATHETERIZATION N/A 09/22/2016  ? Procedure: Abdominal Aortogram w/ bilateral Lower Extremity Runoff;  Surgeon: Serafina Mitchell, MD;  Location: [facility];  Service: Cardiovascular;  Laterality: N/A;  ? PERIPHERAL VASCULAR CATHETERIZATION Right 09/22/2016  ? Procedure: Peripheral Vascular Intervention;  Surgeon: Serafina Mitchell, MD;  Location: [facility];  Service: Cardiovascular;  Laterality: Right;  ? ?HPI:  ?71 yo male presented 4/29 with sepsis secondary to CAP, requiring intubation and mechanical ventilation for respiratory failure. ETT 4/29-5/2. MRI: punctate left temporal infarct; chronic cortical infarcts in the left middle and superior frontal gyri.  History of bilateral carotid artery stenosis (total occlusion of the left, moderate stenosis of the right), HTN, HLD, prior tobacco abuse, diabetes, coronary artery disease s/p CABG, peripheral vascular disease s/p stents.  ?  ?  Assessment / Plan / Recommendation  ?Clinical Impression ? Pt participated in a clinical swallowing assessment. He had intermittent difficulty following commands; voice was hoarse and attempts to speak revealed aphasia with difficulty with word retrieval and  occasional perseverations. He is edentulous. Mild right facial asymmetry. He accepted sips of water and ice chips, which led to consistent coughing and concerns for potential aspiration. Purees/honey thick liquids were swallowed without incident. Dysphagia is more likely related to impact of three-day intubation on laryngeal closure than effects of stroke. Given intermittent lethargy, recommend allowing sips of honey thick liquid and tspns of puree from floor stock for tonight but deferring meal tray at this point. SLP will f/u for diet readiness. D/W RN. ?SLP Visit Diagnosis: Dysphagia, unspecified (R13.10) ?   ? ?    ?  ?Diet Recommendation   May have purees and honey thick liquid from floor stock. ? ?Medication Administration: Crushed with puree  ?  ?Other  Recommendations Oral Care Recommendations: Oral care QID   ? ?Recommendations for follow up therapy are one component of a multi-disciplinary discharge planning process, led by the attending physician.  Recommendations may be updated based on patient status, additional functional criteria and insurance authorization. ? ?Follow up Recommendations Other (comment) (tba)  ? ? ?  ?Assistance Recommended at Discharge Frequent or constant Supervision/Assistance  ?Functional Status Assessment Patient has had a recent decline in their functional status and demonstrates the ability to make significant improvements in function in a reasonable and predictable amount of time.  ?Frequency and Duration min 2x/week  ?2 weeks ?  ?   ? ?Prognosis Prognosis for Safe Diet Advancement: Good  ? ?  ? ?Swallow Study   ?General Date of Onset: 12/20/21 ?HPI: 71 yo male presented 4/29 with sepsis secondary to CAP, requiring intubation and mechanical ventilation for respiratory failure. ETT 4/29-5/2. MRI: punctate left temporal infarct; chronic cortical infarcts in the left middle and superior frontal gyri.  History of bilateral carotid artery stenosis (total occlusion of the left,  moderate stenosis of the right), HTN, HLD, prior tobacco abuse, diabetes, coronary artery disease s/p CABG, peripheral vascular disease s/p stents. ?Type of Study: Bedside Swallow Evaluation ?Previous Swallow Assessment: no ?Diet Prior to this Study: NPO ?Temperature Spikes Noted: No ?Respiratory Status: Nasal cannula ?History of Recent Intubation: Yes ?Length of Intubations (days): 3 days ?Date extubated: 12/23/21 ?Behavior/Cognition: Alert ?Oral Cavity Assessment: Within Functional Limits ?Oral Care Completed by SLP: Recent completion by staff ?Oral Cavity - Dentition: Edentulous ?Self-Feeding Abilities: Total assist ?Patient Positioning: Upright in bed ?Baseline Vocal Quality: Hoarse ?Volitional Cough: Cognitively unable to elicit ?Volitional Swallow: Able to elicit  ?  ?Oral/Motor/Sensory Function Overall Oral Motor/Sensory Function: Mild impairment ?Facial ROM: Reduced right;Suspected CN VII (facial) dysfunction ?Facial Symmetry: Abnormal symmetry right;Suspected CN VII (facial) dysfunction   ?Ice Chips Ice chips: Impaired ?Pharyngeal Phase Impairments: Cough - Immediate   ?Thin Liquid Thin Liquid: Impaired ?Presentation: Spoon ?Pharyngeal  Phase Impairments: Cough - Immediate  ?  ?Nectar Thick Nectar Thick Liquid: Not tested   ?Honey Thick Honey Thick Liquid: Within functional limits ?Presentation: Spoon   ?Puree Puree: Within functional limits   ?Solid ? ? ?  Solid: Not tested  ? ?  ?Reginald Shatswell L. Vona Whiters, MA CCC/SLP ?Acute Rehabilitation Services ?Office number [phone] ?Pager [phone] ? ?Juan Quam Laurice ?12/23/2021,5:18 PM ? ? ? ?

## 2021-12-23 NOTE — Progress Notes (Signed)
ANTICOAGULATION CONSULT NOTE ? ?Pharmacy Consult for heparin ?Indication: atrial fibrillation ? ?Allergies  ?Allergen Reactions  ? Actos [Pioglitazone] Shortness Of Breath  ?  Leg swelling   ? Atorvastatin   ?  Other reaction(s): leg pain  ? Lisinopril Cough  ? ? ?Patient Measurements: ?Height: 5\' 3"  (160 cm) ?Weight: 52.7 kg (116 lb 2.9 oz) ?IBW/kg (Calculated) : 56.9 ?Heparin Dosing Weight: 51.2 kg ? ?Vital Signs: ?Temp: 100.4 ?F (38 ?C) (05/02 [id number]) ?Temp Source: Axillary (05/02 [id number]) ?BP: 134/67 (05/02 0750) ?Pulse Rate: 105 (05/02 0750) ? ?Labs: ?Recent Labs  ?  12/21/21 ?0610 12/21/21 ?1202 12/21/21 ?1233 12/21/21 ?1512 12/22/21 ?0130 12/22/21 ?1107 12/22/21 ?2135 12/23/21 ?0459 12/23/21 ?0654  ?HGB 9.9* 8.8*  --   --  9.7*  --   --  9.0*  --   ?HCT 32.1* 26.0*  --   --  29.6*  --   --  26.8*  --   ?PLT 244  --   --   --  228  --   --  211  --   ?HEPARINUNFRC  --   --   --   --   --   --  0.24*  --  0.42  ?CREATININE 2.03*  --   --   --  1.82* 1.75*  --  1.71*  --   ?TROPONINIHS  --   --    < > 736*  --  432*  --  244*  --   ? < > = values in this interval not displayed.  ? ? ? ?Estimated Creatinine Clearance: 29.1 mL/min (A) (by C-G formula based on SCr of 1.71 mg/dL (H)). ? ? ?Medical History: ?Past Medical History:  ?Diagnosis Date  ? Carotid artery disease ([place])   ? L-ICA 100%, mod R-ICA dz  ? COPD (chronic obstructive pulmonary disease) ([place])   ? Coronary artery disease   ? s/p CABG February 2010 by Dr. Merilynn Finland  ? Diabetes ([place])   ? Emphysema lung ([place])   ? Hyperlipidemia   ? Hypertension   ? Peripheral arterial disease ([place])   ? post left common iliac and right SFA stenting remotely  ? Tobacco abuse   ?    ? ? ?Assessment: ?71 yo male with afib. Patient with new stroke. Pharmacy consulted to start heparin drip with low goal and no bolus per CVA protocol. No prior AC noted PTA. ? ?Heparin level of 0.42 is therapeutic on heparin 900 units/hr.Hgb 9.0 Plt wnl. No bleeding noted.  ? ? ?Goal of  Therapy:  ?Heparin level 0.3-0.5 units/ml ?Monitor platelets by anticoagulation protocol: Yes ?  ?Plan:  ?Continue heparin 900 units/hr  ?Check 8 hr confirmatory heparin level ?Monitor heparin level, CBC and s/s of bleeding  ? ?Cristela Felt, PharmD, BCPS ?Clinical Pharmacist ?12/23/2021 7:52 AM ? ?

## 2021-12-23 NOTE — Procedures (Signed)
Extubation Procedure Note ? ?Patient Details:   ?Name: Reginald Ayers ?DOB: Sep 26, 1950 ?MRN: [id number] ?  ?Airway Documentation:  ?  ?Vent end date: 12/23/21 Vent end time: [id number]  ? ?Evaluation ? O2 sats: stable throughout ?Complications: No apparent complications ?Patient did tolerate procedure well. ?Bilateral Breath Sounds: Diminished ?  ?Yes ? ?Pt extubated per MD order. Placed on 4L [place]. Positive cuff leak noted, no stridor heard. Vitals stable throughout. RT will continue to monitor.   ? ?Tobi Bastos ?12/23/2021, 2:55 PM ? ?

## 2021-12-23 NOTE — Evaluation (Signed)
Occupational Therapy Evaluation ?Patient Details ?Name: Reginald Ayers ?MRN: [id number] ?DOB: [birth year] ?Today's Date: 12/23/2021 ? ? ?History of Present Illness 71 year old man who presented with increasing shortness of breath and required intubation and mechanical ventilation for respiratory failure.  ? ?Clinical Impression ?  ?Pt admitted with the above diagnosis and has the deficits outlined below. Pt would benefit from cont OT to increase ability to complete adls so he can eventually d/c home with his niece to assist. Niece stated he was very independent prior to admit and lives with a roommate in a one story home. Niece is able to assist full time after d/c. Pt used some adaptive equipment including a cane and reacher/sock aid and did not drive but otherwise took care of himself. Will continue to see with focus on restoring adl function.   ?  ?   ? ?Recommendations for follow up therapy are one component of a multi-disciplinary discharge planning process, led by the attending physician.  Recommendations may be updated based on patient status, additional functional criteria and insurance authorization.  ? ?Follow Up Recommendations ? Skilled nursing-short term rehab (<3 hours/day)  ?  ?Assistance Recommended at Discharge Frequent or constant Supervision/Assistance  ?Patient can return home with the following Two people to help with walking and/or transfers;Two people to help with bathing/dressing/bathroom;Assistance with cooking/housework;Assistance with feeding;Direct supervision/assist for medications management;Direct supervision/assist for financial management;Assist for transportation;Help with stairs or ramp for entrance ? ?  ?Functional Status Assessment ? Patient has had a recent decline in their functional status and demonstrates the ability to make significant improvements in function in a reasonable and predictable amount of time.  ?Equipment Recommendations ? Other (comment) (tbd)  ?  ?Recommendations  for Other Services   ? ? ?  ?Precautions / Restrictions Precautions ?Precautions: Other (comment) (multiple lines and on vent) ?Precaution Comments: pt with multiple lines and currenlty on vent ?Restrictions ?Weight Bearing Restrictions: No  ? ?  ? ?Mobility Bed Mobility ?Overal bed mobility: Needs Assistance ?Bed Mobility: Supine to Sit, Sit to Supine ?  ?  ?Supine to sit: Total assist, +2 for physical assistance ?Sit to supine: Total assist, +2 for physical assistance ?  ?General bed mobility comments: Pt was dependent for most mobility.  Pt did move legs some and wiggled toes to command. ?  ? ?Transfers ?  ?  ?  ?  ?  ?  ?  ?  ?  ?General transfer comment: deferred ?  ? ?  ?Balance Overall balance assessment: Needs assistance ?Sitting-balance support: No upper extremity supported, Feet unsupported ?Sitting balance-Leahy Scale: Zero ?Sitting balance - Comments: Pt did attempt to hold self at times at EOB but overall dependent on this firs time up on vent. ?  ?  ?  ?Standing balance comment: unable to stand ?  ?  ?  ?  ?  ?  ?  ?  ?  ?  ?  ?   ? ?ADL either performed or assessed with clinical judgement  ? ?ADL Overall ADL's : Needs assistance/impaired ?Eating/Feeding: NPO ?  ?Grooming: Wash/dry hands;Wash/dry face;Total assistance;Sitting ?Grooming Details (indicate cue type and reason): Pt sat supported on EOB. Pt did initate taking hand to face with washcloth after doing hand over hand with therapist. ?Upper Body Bathing: Total assistance ?  ?Lower Body Bathing: Total assistance;+2 for physical assistance ?  ?Upper Body Dressing : Total assistance ?  ?Lower Body Dressing: Total assistance;+2 for physical assistance ?  ?  ?Statistician Details (indicate  cue type and reason): unable. First time to EOB on vent ?Toileting- Clothing Manipulation and Hygiene: Total assistance;+2 for physical assistance;Bed level ?Toileting - Clothing Manipulation Details (indicate cue type and reason): male purwick ?  ?  ?Functional  mobility during ADLs: Total assistance;+2 for physical assistance ?General ADL Comments: Pt remains on vent but did come to EOB today and followed simple commands. Pt was moving B UEs to command but not much movement noted in shoulder at this point. Pt very weak but participating well.  ? ? ? ?Vision Baseline Vision/History: 0 No visual deficits ?Ability to See in Adequate Light: 0 Adequate ?Patient Visual Report: No change from baseline ?Vision Assessment?: No apparent visual deficits ?Additional Comments: will circle back to fully evaluate but appear intact.  ?   ?Perception   ?  ?Praxis   ?  ? ?Pertinent Vitals/Pain Pain Assessment ?Pain Assessment: Faces ?Faces Pain Scale: Hurts a little bit ?Facial Expression: Relaxed, neutral ?Body Movements: Absence of movements ?Muscle Tension: Relaxed ?Compliance with ventilator (intubated pts.): Tolerating ventilator or movement ?Vocalization (extubated pts.): N/A ?CPOT Total: 0 ?Pain Location: unsure ?Pain Descriptors / Indicators: Sore ?Pain Intervention(s): Monitored during session, Repositioned  ? ? ? ?Hand Dominance Right ?  ?Extremity/Trunk Assessment Upper Extremity Assessment ?Upper Extremity Assessment: Generalized weakness ?  ?Lower Extremity Assessment ?Lower Extremity Assessment: Defer to PT evaluation ?  ?Cervical / Trunk Assessment ?Cervical / Trunk Assessment: Normal ?  ?Communication Communication ?Communication: HOH ?  ?Cognition Arousal/Alertness: Awake/alert ?Behavior During Therapy: Flat affect ?Overall Cognitive Status: Difficult to assess ?  ?  ?  ?  ?  ?  ?  ?  ?  ?  ?  ?  ?  ?  ?  ?  ?General Comments: Pt on vent so difficut to fully assess cognition. Pt's neice states he is intact cognitively. Pt was following most simple commands. ?  ?  ?General Comments  Pt did very well for first time up and on vent. Eyes open, following most commands but fatiguing very quickly. ? ?  ?Exercises   ?  ?Shoulder Instructions    ? ? ?Home Living Family/patient  expects to be discharged to:: Private residence ?  ?  ?  ?  ?  ?  ?  ?  ?  ?  ?  ?  ?  ?  ?  ?  ?  ?  ? ?  ?Prior Functioning/Environment Prior Level of Function : Independent/Modified Independent ?  ?  ?  ?  ?  ?  ?Mobility Comments: pt reports intermittent use of cane, walks short distances in the home. takes care of his 6 dogs ?ADLs Comments: Pt mod I with all adls per neice. Sponge bahtes only ?  ? ?  ?  ?OT Problem List: Decreased strength;Decreased range of motion;Decreased activity tolerance;Impaired balance (sitting and/or standing);Decreased coordination;Decreased knowledge of use of DME or AE;Cardiopulmonary status limiting activity;Impaired UE functional use;Pain ?  ?   ?OT Treatment/Interventions: Self-care/ADL training;Therapeutic exercise;Therapeutic activities  ?  ?OT Goals(Current goals can be found in the care plan section) Acute Rehab OT Goals ?Patient Stated Goal: none stated ?OT Goal Formulation: With family ?Time For Goal Achievement: 01/06/22 ?Potential to Achieve Goals: Fair ?ADL Goals ?Pt Will Perform Grooming: with min assist;sitting ?Additional ADL Goal #1: Pt will sit EOB for 5 min to complete one grooming task with min assist for balance. ?Additional ADL Goal #2: Pt will follow 100% of commands for simple adls to increase participation in self  care without cues. ?Additional ADL Goal #3: Pt will stand EOB with +2 assist to initiate ability to complete adls in standing.  ?OT Frequency: Min 2X/week ?  ? ?Co-evaluation PT/OT/SLP Co-Evaluation/Treatment: Yes ?Reason for Co-Treatment: Complexity of the patient's impairments (multi-system involvement) ?PT goals addressed during session: Mobility/safety with mobility ?OT goals addressed during session: ADL's and self-care ?  ? ?  ?AM-PAC OT "6 Clicks" Daily Activity     ?Outcome Measure Help from another person eating meals?: Total ?Help from another person taking care of personal grooming?: Total ?Help from another person toileting, which  includes using toliet, bedpan, or urinal?: Total ?Help from another person bathing (including washing, rinsing, drying)?: Total ?Help from another person to put on and taking off regular upper body clothing?: T

## 2021-12-23 NOTE — Progress Notes (Signed)
? ?NAME:  Reginald Ayers, MRN:  [id number], DOB:  May 21, 1951, LOS: 3 ?ADMISSION DATE:  12/20/2021, CONSULTATION DATE:  12/20/21 ?REFERRING MD:  EDP, CHIEF COMPLAINT:  Respiratory Failure  ? ?History of Present Illness:  ?Patient is a 71 year old man who presented with increasing shortness of breath, weakness that worsened requiring ED visit. He has hx of COPD, HTN, HLD, CAD, PAD, and tobacco use disorder. Patient feeling off-baseline for a few weeks with fatigue, coughing, and shortness of breath. He presented to the ED via EMS and continued to worsen and required intubation and mechanical ventilation for respiratory failure. Patient had episode of vomiting yesterday.  ?  ?Pertinent  Medical History  ? ? Carotid artery disease ([place])    ?  L-ICA 100%, mod R-ICA dz  ? COPD (chronic obstructive pulmonary disease) ([place])    ? Coronary artery disease    ?  s/p CABG February 2010 by Dr. Merilynn Finland  ? Diabetes ([place])    ? Emphysema lung ([place])    ? Hyperlipidemia    ? Hypertension    ? Peripheral arterial disease ([place])    ?  post left common iliac and right SFA stenting remotely  ? Tobacco abuse   ? ?Significant Hospital Events: ?Including procedures, antibiotic start and stop dates in addition to other pertinent events   ?04/29-Intubated and admitted to the ICU. >> A fib and elevated troponins ?4/30 MRI Brain >> acute stroke in the left hemisphere, Heparin stopped ?5/1 MRA Head, unable to order CTA because of renal function >>  ?Interim History / Subjective:  ? ?Remains intubated on PRVC; failed SBT w/ vt 200s and RR 40s ?Remains on propofol and not following commands; PERRL; weak cough/gag ?Tmax 103 ?  ? ?Objective   ?Blood pressure 134/67, pulse (!) 114, temperature (!) 103.1 ?F (39.5 ?C), temperature source Oral, resp. rate (!) 24, height 5\' 3"  (1.6 m), weight 52.7 kg, SpO2 100 %. ?   ?Vent Mode: PRVC ?FiO2 (%):  [40 %] 40 % ?Set Rate:  [22 bmp] 22 bmp ?Vt Set:  [450 mL] 450 mL ?PEEP:  [5 cmH20] 5 cmH20 ?Pressure  Support:  [8 cmH20] 8 cmH20 ?Plateau Pressure:  [13 cmH20-19 cmH20] 13 cmH20  ? ?Intake/Output Summary (Last 24 hours) at 12/23/2021 [id number] ?Last data filed at 12/23/2021 0700 ?Gross per 24 hour  ?Intake 1413.16 ml  ?Output 1725 ml  ?Net -311.84 ml  ? ? ?Filed Weights  ? 12/20/21 1055 12/21/21 0400 12/23/21 0440  ?Weight: 48.5 kg 51.2 kg 52.7 kg  ? ?Examination: ?General:  critically ill appearing on mech vent ?HEENT: MM pink/moist; ETT in place ?Neuro: sedated w/ propofol; PERRL; weak cough/gag; not following commands; withdrawals to painful stimuli ?CV: s1s2, tachy 110s, no m/r/g ?PULM:  dim clear BS bilaterally; on mech vent PRVC ?GI: soft, bsx4 active  ?Extremities: warm/dry, no edema  ?Skin: no rashes or lesions appreciated ? ? ?Consults  ?Neuro ?Resolved Hospital Problem list   ? ?Assessment & Plan:  ? ?Acute respiratory failure with hypoxia ([place]) ?Hx of COPD: no pft's on chart ?P: ?-LTVV strategy with tidal volumes of 6-8 cc/kg ideal body weight ?-Wean PEEP/FiO2 for SpO2 >92% ?-VAP bundle in place ?-Daily SAT and SBT ?-PAD protocol in place ?-wean sedation for RASS goal 0 to -1 ?-continue brovana, yupelri, pulmicort; prn albuterol for wheezing ? ?Sepsis due to pneumonia [facility]) ?P: ?-wbc trending down; tmax overnight 103 ?-azithro/rocephin x 5 days ? ?AKI (acute kidney injury) ([place]) ?Acidosis, metabolic ?Hypokalemia ?Hypomagnesemia ?P: ?-  replete K this morning; repeat bmp later today ?-Trend BMP/ Mag / urinary output ?-Replace electrolytes as indicated ?-Avoid nephrotoxic agents, ensure adequate renal perfusion ? ?Hypernatremia ?P: ?-start free water today ?-trend na ?  ?Concern for Stroke ?-Concern for stroke with intial presentation. MRI brain 4/29 showed acute stroke in the left hemisphere, plavix continued. MRA  ordered for 5/1 as need to look at  vasculature, and CTA not possibility due to renal function  ?P: ?-Neuro following ?-MRA pending ?-frequent neuro checks; limit sedating meds ?-cont heparin IV and  plavix ?-BP goal per neuro; prn metoprolol ? ?New onset Atrial Fibrillation ?Acute systolic CHF: Echo 5/1 LVEF 50-55% ?Hx of CABG Feb 2010 ?Hx HTN, HLD, CAD, PAD ?Has hx of CABG and hx of HLD, CAD, and PAD. New onset a-fib most likely in setting of acute illness.  ?P: ?-telemetry monitoring ?-continue plavix ?-heparin gtt ?-prn metoprolol ? ?DMT2 ?Type 2 DM with hyperglycemia. On orals at home.  ?P: ?-increased SSI ?-continue tf coverage ?-cbg monitoring ? ?Constipation ?P: ?-bowel regimen ordered ? ?Severe protein-energy malnutrition ([place]) ?Appears cachectic with decrease muscle mass ? P: ?-continue TF per dietician ? ?Right arm edema ?P: ?-Doppler radial  pulse  ?-check cap refill ? ?  ?Best Practice (right click and "Reselect all SmartList Selections" daily)  ?Diet/type: tubefeeds ?DVT prophylaxis: systemic heparin ?GI prophylaxis: PPI ?Lines: N/A ?Foley:  N/A ?Continuous: Propofol on hold this am ?Code Status:  full code ?Last date of multidisciplinary goals of care discussion [ 5/2 spoke with niece ernestine over phone and gave update. She states she would like for him to remain full code. I also mentioned that we are still unable to wean from ventilator and concerned that he may require tracheostomy if we are unable to wean from ventilator in the next several days. She states that she is not sure yet what he would want and would need some time to make that decision. ] ? ? ?Critical Care Time: 35 minutes ? ?Mikki Harbor, PA-C ?[facility] ?12/23/2021, 8:27 AM ? ?Please see [email or web address] for pager details. ? ?From 7A-7P if no response, please call [phone]. ?After hours, please call Warren Lacy [phone]. ? ? ?  ?

## 2021-12-23 NOTE — Progress Notes (Signed)
SLP Cancellation Note ? ?Patient Details ?Name: Reginald Ayers ?MRN: [id number] ?DOB: [birth year] ? ? ?Cancelled treatment:       Reason Eval/Treat Not Completed: Patient not medically ready (remains on vent). Will f/u as able.  ? ? ? ?Osie Bond., M.A. CCC-SLP ?Acute Rehabilitation Services ?Office [phone] ? ?Secure chat preferred ? ?12/23/2021, 7:10 AM ?

## 2021-12-23 NOTE — Evaluation (Signed)
Physical Therapy Evaluation ?Patient Details ?Name: Reginald Ayers ?MRN: [id number] ?DOB: 26-Mar-1951 ?Today's Date: 12/23/2021 ? ?History of Present Illness ? The pt is a 71 yo male presenting 4/29 with increasing shortness of breath and required intubation and mechanical ventilation for respiratory failure. MRI shows left ICA with poor flow and evidence of reconstitued flow at L ICA terminus, MRA pending.  History of bilateral carotid artery stenosis (total occlusion of the left, moderate stenosis of the right), HTN, HLD, prior tobacco abuse, diabetes, coronary artery disease s/p CABG, peripheral vascular disease s/p stents.  ?Clinical Impression ? Pt presents with decreased functional mobility, strength, ROM, sitting balance, transfer ability, and cognition secondary to diagnosis above. These impairments are limiting his ability to safely and independently transfer, get into his home, perform all adls/iadls, and ambulate in the community. Pt to benefit from acute PT to address deficits. Bed mobility and prolonged sitting balance performed, along with ADL practice.  The patient required total A for bed mobility and max-total A for seated balance. He responds appropriately to ~75% of one step commands. The session was limited secondary to his functional and mental fatigue levels.  PLOF collected from previous encounter 2 months ago and OT conversation with pt's niece. Pt BP was 158/79 in supine, then 170/95 in sitting RN notified and she states that neurology wants it to be elevated for proper profusion secondary to ICA occulusion. SPT recommends SNF follow up for further functional mobility, transfer, balance, and gait training once medically stable for d/c. PT to progress mobility as tolerated, and will continue to follow acutely.  ?   ?   ? ?Recommendations for follow up therapy are one component of a multi-disciplinary discharge planning process, led by the attending physician.  Recommendations may be updated based  on patient status, additional functional criteria and insurance authorization. ? ?Follow Up Recommendations Skilled nursing-short term rehab (<3 hours/day) (May progress to [place]) ? ?  ?Assistance Recommended at Discharge Frequent or constant Supervision/Assistance  ?Patient can return home with the following ? Two people to help with walking and/or transfers;Two people to help with bathing/dressing/bathroom;Assistance with cooking/housework;Assistance with feeding;Direct supervision/assist for medications management;Direct supervision/assist for financial management;Assist for transportation;Help with stairs or ramp for entrance ? ?  ?Equipment Recommendations  (TBD)  ?   ?Functional Status Assessment Patient has had a recent decline in their functional status and demonstrates the ability to make significant improvements in function in a reasonable and predictable amount of time.  ? ?  ?Precautions / Restrictions Precautions ?Precautions: Fall;Other (comment) ?Precaution Comments: On vent, intubated. O2 40%, PEEP 5 ?Restrictions ?Weight Bearing Restrictions: No  ? ?  ? ?Mobility ? Bed Mobility ?Overal bed mobility: Needs Assistance ?Bed Mobility: Supine to Sit, Sit to Supine ?  ?  ?Supine to sit: Total assist, +2 for physical assistance ?Sit to supine: Total assist, +2 for physical assistance ?  ?General bed mobility comments: Pt requires total trunk and LE management. Follows commands with toes and hands. ?Patient Response: Flat affect ? ? ? ? ?Modified Rankin (Stroke Patients Only) ?Modified Rankin (Stroke Patients Only) ?Pre-Morbid Rankin Score: Slight disability ?Modified Rankin: Severe disability ? ?  ? ?Balance Overall balance assessment: Needs assistance ?Sitting-balance support: No upper extremity supported, Feet unsupported ?Sitting balance-Leahy Scale: Zero ?Sitting balance - Comments: Pt shows some trunk control but fatigues quickly. Requires posterior support duing sitting balanace. ?Postural control:  Posterior lean, Right lateral lean ?  ?  ?Standing balance comment: unable to stand ?  ?  ?  ?  ?  ?  ?  ?  ?  ?  ?  ?   ? ? ? ?  Pertinent Vitals/Pain Pain Assessment ?Pain Assessment: Faces ?Faces Pain Scale: Hurts a little bit ?Pain Location: General discomfort ?Pain Descriptors / Indicators: Sore, Grimacing, Guarding ?Pain Intervention(s): Limited activity within patient's tolerance, Repositioned  ? ? ?Home Living Family/patient expects to be discharged to:: Private residence ?Living Arrangements: Non-relatives/Friends ?Available Help at Discharge: Friend(s);Available PRN/intermittently ?Type of Home: House ?Home Access: Level entry ?  ?  ?  ?Home Layout: One level ?Home Equipment: Rolling Walker (2 wheels);BSC/3in1;Cane - single point;Adaptive equipment ?   ?  ?Prior Function Prior Level of Function : Independent/Modified Independent ?  ?  ?  ?  ?  ?  ?Mobility Comments: pt' neice intermittent use of cane, walks short distances in the home. takes care of his 6 dogs ?ADLs Comments: Pt mod I with all adls per neice. Sponge bahtes only ?  ? ? ?Hand Dominance  ? Dominant Hand: Right ? ?  ?Extremity/Trunk Assessment  ? Upper Extremity Assessment ?Upper Extremity Assessment: Defer to OT evaluation ?  ? ?Lower Extremity Assessment ?Lower Extremity Assessment: Generalized weakness ?  ? ?Cervical / Trunk Assessment ?Cervical / Trunk Assessment: Normal  ?Communication  ? Communication: HOH  ?Cognition Arousal/Alertness: Awake/alert ?Behavior During Therapy: Flat affect ?Overall Cognitive Status: Difficult to assess ?  ?  ?  ?  ?  ?  ?  ?  ?  ?  ?  ?  ?  ?  ?  ?  ?General Comments: Follows roughly 75% of commands, mentally fatigues at the end of session. ?  ?  ? ?  ?General Comments General comments (skin integrity, edema, etc.): Pt's eyes were open and shows command following for simple cues, fatigues quickly. ? ?  ?   ? ?Assessment/Plan  ?  ?PT Assessment Patient needs continued PT services  ?PT Problem List Decreased  strength;Decreased mobility;Decreased safety awareness;Impaired tone;Decreased range of motion;Decreased coordination;Decreased knowledge of precautions;Decreased activity tolerance;Decreased cognition;Cardiopulmonary status limiting activity;Decreased balance;Decreased knowledge of use of DME;Impaired sensation ? ?   ?  ?PT Treatment Interventions DME instruction;Therapeutic activities;Cognitive remediation;Gait training;Therapeutic exercise;Patient/family education;Stair training;Balance training;Functional mobility training;Neuromuscular re-education;Manual techniques;Wheelchair mobility training   ? ?PT Goals (Current goals can be found in the Care Plan section)  ?Acute Rehab PT Goals ?Patient Stated Goal: unable ?PT Goal Formulation: Patient unable to participate in goal setting ?Time For Goal Achievement: 01/06/22 ?Potential to Achieve Goals: Fair ? ?  ?Frequency Min 3X/week ?  ? ? ?Co-evaluation PT/OT/SLP Co-Evaluation/Treatment: Yes ?Reason for Co-Treatment: Complexity of the patient's impairments (multi-system involvement) ?PT goals addressed during session: Mobility/safety with mobility ?OT goals addressed during session: ADL's and self-care ?  ? ? ?  ?AM-PAC PT "6 Clicks" Mobility  ?Outcome Measure Help needed turning from your back to your side while in a flat bed without using bedrails?: Total ?Help needed moving from lying on your back to sitting on the side of a flat bed without using bedrails?: Total ?Help needed moving to and from a bed to a chair (including a wheelchair)?: Total ?Help needed standing up from a chair using your arms (e.g., wheelchair or bedside chair)?: Total ?Help needed to walk in hospital room?: Total ?Help needed climbing 3-5 steps with a railing? : Total ?6 Click Score: 6 ? ?  ?End of Session Equipment Utilized During Treatment: Other (comment) (Ventilator) ?Activity Tolerance: Patient limited by fatigue ?Patient left: in bed;with call bell/phone within reach;with bed alarm  set ?Nurse Communication: Mobility status ?PT Visit Diagnosis: Unsteadiness on feet (R26.81);Muscle weakness (generalized) (M62.81);Other symptoms and signs involving  the nervous system (R29.898) ?  ? ?Time:

## 2021-12-23 NOTE — Progress Notes (Signed)
ANTICOAGULATION CONSULT NOTE ? ?Pharmacy Consult for heparin ?Indication: atrial fibrillation ? ?Allergies  ?Allergen Reactions  ? Actos [Pioglitazone] Shortness Of Breath  ?  Leg swelling   ? Atorvastatin   ?  Other reaction(s): leg pain  ? Lisinopril Cough  ? ? ?Patient Measurements: ?Height: 5\' 3"  (160 cm) ?Weight: 52.7 kg (116 lb 2.9 oz) ?IBW/kg (Calculated) : 56.9 ?Heparin Dosing Weight: 51.2 kg ? ?Vital Signs: ?Temp: 98.3 ?F (36.8 ?C) (05/02 1506) ?Temp Source: Oral (05/02 1506) ?BP: 170/89 (05/02 1300) ?Pulse Rate: 120 (05/02 1454) ? ?Labs: ?Recent Labs  ?  12/21/21 ?0610 12/21/21 ?1202 12/21/21 ?1233 12/21/21 ?1512 12/22/21 ?0130 12/22/21 ?1107 12/22/21 ?2135 12/23/21 ?0459 12/23/21 ?0654 12/23/21 ?1408  ?HGB 9.9* 8.8*  --   --  9.7*  --   --  9.0*  --   --   ?HCT 32.1* 26.0*  --   --  29.6*  --   --  26.8*  --   --   ?PLT 244  --   --   --  228  --   --  211  --   --   ?HEPARINUNFRC  --   --   --   --   --   --  0.24*  --  0.42 0.33  ?CREATININE 2.03*  --   --   --  1.82* 1.75*  --  1.71* 1.70*  --   ?TROPONINIHS  --   --    < > 736*  --  432*  --  244*  --   --   ? < > = values in this interval not displayed.  ? ? ? ?Estimated Creatinine Clearance: 29.3 mL/min (A) (by C-G formula based on SCr of 1.7 mg/dL (H)). ? ? ?Medical History: ?Past Medical History:  ?Diagnosis Date  ? Carotid artery disease ([place])   ? L-ICA 100%, mod R-ICA dz  ? COPD (chronic obstructive pulmonary disease) ([place])   ? Coronary artery disease   ? s/p CABG February 2010 by Dr. Merilynn Finland  ? Diabetes ([place])   ? Emphysema lung ([place])   ? Hyperlipidemia   ? Hypertension   ? Peripheral arterial disease ([place])   ? post left common iliac and right SFA stenting remotely  ? Tobacco abuse   ?    ? ? ?Assessment: ?71 yo male with afib. Patient with new stroke. Pharmacy consulted to start heparin drip with low goal and no bolus per CVA protocol. No prior AC noted PTA. ? ?Heparin level of 0.33 is therapeutic on heparin 900 units/hr.Hgb 9.0 Plt  wnl. No bleeding noted.  ? ? ?Goal of Therapy:  ?Heparin level 0.3-0.5 units/ml ?Monitor platelets by anticoagulation protocol: Yes ?  ?Plan:  ?Continue heparin 900 units/hr  ?Monitor heparin level, CBC with am labs and s/s of bleeding  ? ?Alanda Slim, PharmD, FCCM ?Clinical Pharmacist ?Please see AMION for all Pharmacists' Contact Phone Numbers ?12/23/2021, 3:23 PM  ? ? ? ?

## 2021-12-23 NOTE — Progress Notes (Addendum)
STROKE TEAM PROGRESS NOTE  ? ?INTERVAL HISTORY ?Remains intubated and on a heparin drip. Currently opens eyes and intermittently following commands.  ?Awaiting MRA. Carotid duplex shoes Rt carotid with 80-99% and Lt ICA is occluded.  ? ?Vitals:  ? 12/23/21 0958 12/23/21 1000 12/23/21 1104 12/23/21 1112  ?BP: (!) 146/78 (!) 146/78    ?Pulse: 98 (!) 101  99  ?Resp: (!) 23 (!) 28  (!) 23  ?Temp:   99 ?F (37.2 ?C)   ?TempSrc:   Oral   ?SpO2: 100% 100%  100%  ?Weight:      ?Height:      ? ?CBC:  ?Recent Labs  ?Lab 12/20/21 ?1116 12/20/21 ?1127 12/22/21 ?0130 12/23/21 ?0459  ?WBC 18.8*   < > 14.0* 11.2*  ?NEUTROABS 16.6*  --   --  9.9*  ?HGB 10.4*   < > 9.7* 9.0*  ?HCT 33.5*   < > 29.6* 26.8*  ?MCV 95.7   < > 90.8 90.8  ?PLT 284   < > 228 211  ? < > = values in this interval not displayed.  ? ? ?Basic Metabolic Panel:  ?Recent Labs  ?Lab 12/22/21 ?0130 12/22/21 ?1107 12/23/21 ?0459 12/23/21 ?0654  ?NA 142 147* 151* 151*  ?K 3.8 3.2* 3.0* 2.8*  ?CL 112* 114* 118* 117*  ?CO2 20* 23 21* 23  ?GLUCOSE 209* 176* 263* 218*  ?BUN 46* 46* 46* 45*  ?CREATININE 1.82* 1.75* 1.71* 1.70*  ?CALCIUM 8.0* 7.8* 7.7* 7.6*  ?MG 2.3  --  2.0  --   ?PHOS 1.1* 1.3* 5.5*  --   ? ? ?Lipid Panel:  ?Recent Labs  ?Lab 12/21/21 ?1240  ?CHOL 85  ?TRIG 160*  ?HDL 32*  ?CHOLHDL 2.7  ?VLDL 32  ?[place] 21  ? ? ?HgbA1c:  ?Recent Labs  ?Lab 12/21/21 ?2246  ?HGBA1C 8.1*  ? ? ?Urine Drug Screen: No results for input(s): LABOPIA, COCAINSCRNUR, LABBENZ, AMPHETMU, THCU, LABBARB in the last 168 hours.  ?Alcohol Level No results for input(s): ETH in the last 168 hours. ? ?IMAGING past 24 hours ?DG CHEST PORT 1 VIEW ? ?Result Date: 12/22/2021 ?CLINICAL DATA:  Pneumonia.  COPD. EXAM: PORTABLE CHEST 1 VIEW COMPARISON:  December 21, 2021. FINDINGS: The heart size and mediastinal contours are within normal limits. Endotracheal and nasogastric tubes are in grossly good position. Status post coronary bypass graft. Stable bibasilar opacities are noted, right greater than  left, concerning for atelectasis or possibly infiltrate. Small right pleural effusion is noted. The visualized skeletal structures are unremarkable. IMPRESSION: Stable bibasilar opacities are noted, right greater than left, concerning for atelectasis or possibly infiltrates. Small right pleural effusion is noted. Stable support apparatus. Electronically Signed   By: Marijo Conception M.D.   On: 12/22/2021 11:39  ? ?ECHOCARDIOGRAM COMPLETE ? ?Result Date: 12/22/2021 ?   ECHOCARDIOGRAM REPORT   Patient Name:   Reginald Ayers Date of Exam: 12/22/2021 Medical Rec #:  [id number]      Height:       63.0 in Accession #:    [id number]     Weight:       112.9 lb Date of Birth:  May 20, 1951      BSA:          1.516 m? Patient Age:    71 years       BP:           146/70 mmHg Patient Gender: M  HR:           95 bpm. Exam Location:  Inpatient Procedure: 2D Echo, Cardiac Doppler, Color Doppler, Strain Analysis and            Intracardiac Opacification Agent Indications:    AF  History:        Patient has prior history of Echocardiogram examinations, most                 recent 07/09/2015. CAD, COPD; Risk Factors:Dyslipidemia,                 Diabetes and Hypertension.  Sonographer:    Luisa Hart RDCS Referring Phys: 32 RAKESH V ALVA  Sonographer Comments: Technically difficult study due to poor echo windows. Image acquisition challenging due to patient body habitus. IMPRESSIONS  1. Left ventricular ejection fraction, by estimation, is 50 to 55%. The left ventricle has low normal function. The left ventricle demonstrates regional wall motion abnormalities (see scoring diagram/findings for description). Left ventricular diastolic  parameters were normal. There is dyskinesis of the left ventricular, apical apical segment.  2. Right ventricular systolic function was not well visualized. The right ventricular size is normal. There is normal pulmonary artery systolic pressure.  3. The mitral valve is grossly normal. Trivial  mitral valve regurgitation. No evidence of mitral stenosis. Moderate mitral annular calcification.  4. The aortic valve was not well visualized. There is mild calcification of the aortic valve. Aortic valve regurgitation is not visualized. Aortic valve sclerosis/calcification is present, without any evidence of aortic stenosis.  5. The inferior vena cava is normal in size with <50% respiratory variability, suggesting right atrial pressure of 8 mmHg. Comparison(s): Changes from prior study are noted. Apical dyskinesis visualized with echo contrast. FINDINGS  Left Ventricle: Left ventricular ejection fraction, by estimation, is 50 to 55%. The left ventricle has low normal function. The left ventricle demonstrates regional wall motion abnormalities. Definity contrast agent was given IV to delineate the left ventricular endocardial borders. Global longitudinal strain performed but not reported based on interpreter judgement due to suboptimal tracking. The left ventricular internal cavity size was normal in size. There is no left ventricular hypertrophy. Left  ventricular diastolic parameters were normal. Right Ventricle: The right ventricular size is normal. Right vetricular wall thickness was not well visualized. Right ventricular systolic function was not well visualized. There is normal pulmonary artery systolic pressure. The tricuspid regurgitant velocity is 2.03 m/s, and with an assumed right atrial pressure of 8 mmHg, the estimated right ventricular systolic pressure is 123456 mmHg. Left Atrium: Left atrial size was normal in size. Right Atrium: Right atrial size was normal in size. Pericardium: There is no evidence of pericardial effusion. Mitral Valve: The mitral valve is grossly normal. Moderate mitral annular calcification. Trivial mitral valve regurgitation. No evidence of mitral valve stenosis. MV peak gradient, 7.3 mmHg. The mean mitral valve gradient is 4.0 mmHg. Tricuspid Valve: The tricuspid valve is not  well visualized. Tricuspid valve regurgitation is trivial. No evidence of tricuspid stenosis. Aortic Valve: The aortic valve was not well visualized. There is mild calcification of the aortic valve. Aortic valve regurgitation is not visualized. Aortic valve sclerosis/calcification is present, without any evidence of aortic stenosis. Aortic valve mean gradient measures 5.0 mmHg. Aortic valve peak gradient measures 8.8 mmHg. Aortic valve area, by VTI measures 2.20 cm?. Pulmonic Valve: The pulmonic valve was not well visualized. Pulmonic valve regurgitation is not visualized. No evidence of pulmonic stenosis. Aorta: The aortic root, ascending aorta  and aortic arch are all structurally normal, with no evidence of dilitation or obstruction. Venous: The inferior vena cava is normal in size with less than 50% respiratory variability, suggesting right atrial pressure of 8 mmHg. IAS/Shunts: The interatrial septum was not well visualized.  LEFT VENTRICLE PLAX 2D LVIDd:         4.30 cm     Diastology LVIDs:         3.00 cm     LV e' medial:    6.83 cm/s LV PW:         1.00 cm     LV E/e' medial:  11.5 LV IVS:        1.00 cm     LV e' lateral:   9.60 cm/s LVOT diam:     2.20 cm     LV E/e' lateral: 8.2 LV SV:         42 LV SV Index:   28 LVOT Area:     3.80 cm?  LV Volumes (MOD) LV vol d, MOD A2C: 25.9 ml LV vol d, MOD A4C: 15.6 ml LV vol s, MOD A2C: 18.4 ml LV vol s, MOD A4C: 5.1 ml LV SV MOD A2C:     7.5 ml LV SV MOD A4C:     15.6 ml LV SV MOD BP:      9.9 ml RIGHT VENTRICLE RV Basal diam:  3.00 cm RV Mid diam:    2.80 cm LEFT ATRIUM             Index        RIGHT ATRIUM          Index LA diam:        2.70 cm 1.78 cm/m?   RA Area:     8.48 cm? LA Vol (A2C):   23.8 ml 15.70 ml/m?  RA Volume:   15.50 ml 10.22 ml/m? LA Vol (A4C):   23.8 ml 15.70 ml/m? LA Biplane Vol: 24.7 ml 16.29 ml/m?  AORTIC VALVE                     PULMONIC VALVE AV Area (Vmax):    2.14 cm?      PV Vmax:       0.75 m/s AV Area (Vmean):   2.05 cm?      PV  Vmean:      62.050 cm/s AV Area (VTI):     2.20 cm?      PV VTI:        0.136 m AV Vmax:           148.00 cm/s   PV Peak grad:  2.2 mmHg AV Vmean:          100.000 cm/s  PV Mean grad:  2.0 mmHg AV VTI:

## 2021-12-24 DIAGNOSIS — I63132 Cerebral infarction due to embolism of left carotid artery: Secondary | ICD-10-CM | POA: Diagnosis not present

## 2021-12-24 DIAGNOSIS — J9601 Acute respiratory failure with hypoxia: Secondary | ICD-10-CM | POA: Diagnosis not present

## 2021-12-24 DIAGNOSIS — E872 Acidosis, unspecified: Secondary | ICD-10-CM | POA: Diagnosis not present

## 2021-12-24 LAB — BASIC METABOLIC PANEL
Anion gap: 12 (ref 5–15)
Anion gap: 14 (ref 5–15)
BUN: 40 mg/dL — ABNORMAL HIGH (ref 8–23)
BUN: 34 mg/dL — ABNORMAL HIGH (ref 8–23)
CO2: 23 mmol/L (ref 22–32)
CO2: 22 mmol/L (ref 22–32)
Calcium: 7.9 mg/dL — ABNORMAL LOW (ref 8.9–10.3)
Calcium: 7.9 mg/dL — ABNORMAL LOW (ref 8.9–10.3)
Chloride: 118 mmol/L — ABNORMAL HIGH (ref 98–111)
Chloride: 107 mmol/L (ref 98–111)
Creatinine, Ser: 1.6 mg/dL — ABNORMAL HIGH (ref 0.61–1.24)
Creatinine, Ser: 1.53 mg/dL — ABNORMAL HIGH (ref 0.61–1.24)
GFR, Estimated: 45 mL/min — ABNORMAL LOW (ref >60)
GFR, Estimated: 48 mL/min — ABNORMAL LOW (ref >60)
Glucose, Bld: 169 mg/dL — ABNORMAL HIGH (ref 70–99)
Glucose, Bld: 381 mg/dL — ABNORMAL HIGH (ref 70–99)
Potassium: 4 mmol/L (ref 3.5–5.1)
Potassium: 3.7 mmol/L (ref 3.5–5.1)
Sodium: 153 mmol/L — ABNORMAL HIGH (ref 135–145)
Sodium: 143 mmol/L (ref 135–145)

## 2021-12-24 LAB — GLUCOSE, CAPILLARY
Glucose-Capillary: 142 mg/dL — ABNORMAL HIGH (ref 70–99)
Glucose-Capillary: 164 mg/dL — ABNORMAL HIGH (ref 70–99)
Glucose-Capillary: 227 mg/dL — ABNORMAL HIGH (ref 70–99)
Glucose-Capillary: 248 mg/dL — ABNORMAL HIGH (ref 70–99)
Glucose-Capillary: 335 mg/dL — ABNORMAL HIGH (ref 70–99)
Glucose-Capillary: 357 mg/dL — ABNORMAL HIGH (ref 70–99)

## 2021-12-24 LAB — HEPARIN LEVEL (UNFRACTIONATED)
Heparin Unfractionated: 0.28 IU/mL — ABNORMAL LOW (ref 0.30–0.70)
Heparin Unfractionated: 0.19 IU/mL — ABNORMAL LOW (ref 0.30–0.70)

## 2021-12-24 LAB — CBC
HCT: 26.3 % — ABNORMAL LOW (ref 39.0–52.0)
Hemoglobin: 8.4 g/dL — ABNORMAL LOW (ref 13.0–17.0)
MCH: 30.4 pg (ref 26.0–34.0)
MCHC: 31.9 g/dL (ref 30.0–36.0)
MCV: 95.3 fL (ref 80.0–100.0)
Platelets: 207 10*3/uL (ref 150–400)
RBC: 2.76 MIL/uL — ABNORMAL LOW (ref 4.22–5.81)
RDW: 15.1 % (ref 11.5–15.5)
WBC: 8.7 10*3/uL (ref 4.0–10.5)
nRBC: 0 % (ref 0.0–0.2)

## 2021-12-24 LAB — MAGNESIUM: Magnesium: 1.8 mg/dL (ref 1.7–2.4)

## 2021-12-24 LAB — PHOSPHORUS: Phosphorus: 3.1 mg/dL (ref 2.5–4.6)

## 2021-12-24 LAB — TRIGLYCERIDES: Triglycerides: 117 mg/dL (ref <150)

## 2021-12-24 MED ORDER — ADULT MULTIVITAMIN W/MINERALS CH
1.0000 | ORAL_TABLET | Freq: Every day | ORAL | Status: DC
  Administered 2021-12-24 – 2021-12-31 (×8): 1 via ORAL
  Filled 2021-12-24 (×8): qty 1

## 2021-12-24 MED ORDER — CLOPIDOGREL BISULFATE 75 MG PO TABS
75.0000 mg | ORAL_TABLET | Freq: Every day | ORAL | Status: DC
  Administered 2021-12-24 – 2021-12-31 (×8): 75 mg via ORAL
  Filled 2021-12-24 (×8): qty 1

## 2021-12-24 MED ORDER — INSULIN ASPART 100 UNIT/ML IJ SOLN
3.0000 [IU] | Freq: Three times a day (TID) | INTRAMUSCULAR | Status: DC
  Administered 2021-12-24 – 2021-12-30 (×12): 3 [IU] via SUBCUTANEOUS

## 2021-12-24 MED ORDER — MAGNESIUM SULFATE 2 GM/50ML IV SOLN
2.0000 g | Freq: Once | INTRAVENOUS | Status: AC
  Administered 2021-12-24: 2 g via INTRAVENOUS
  Filled 2021-12-24: qty 50

## 2021-12-24 MED ORDER — POLYETHYLENE GLYCOL 3350 17 G PO PACK
17.0000 g | PACK | Freq: Every day | ORAL | Status: DC
Start: 2021-12-24 — End: 2021-12-31
  Administered 2021-12-27 – 2021-12-30 (×3): 17 g via ORAL
  Filled 2021-12-24 (×4): qty 1

## 2021-12-24 MED ORDER — DOCUSATE SODIUM 100 MG PO CAPS
100.0000 mg | ORAL_CAPSULE | Freq: Two times a day (BID) | ORAL | Status: DC
  Administered 2021-12-24 – 2021-12-31 (×11): 100 mg via ORAL
  Filled 2021-12-24 (×11): qty 1

## 2021-12-24 MED ORDER — INSULIN ASPART 100 UNIT/ML IJ SOLN
0.0000 [IU] | Freq: Every day | INTRAMUSCULAR | Status: DC
  Administered 2021-12-24: 5 [IU] via SUBCUTANEOUS
  Administered 2021-12-28: 3 [IU] via SUBCUTANEOUS
  Administered 2021-12-29: 2 [IU] via SUBCUTANEOUS
  Administered 2021-12-30: 3 [IU] via SUBCUTANEOUS

## 2021-12-24 MED ORDER — INSULIN ASPART 100 UNIT/ML IJ SOLN
0.0000 [IU] | Freq: Three times a day (TID) | INTRAMUSCULAR | Status: DC
  Administered 2021-12-24: 15 [IU] via SUBCUTANEOUS
  Administered 2021-12-25 (×2): 7 [IU] via SUBCUTANEOUS
  Administered 2021-12-25: 11 [IU] via SUBCUTANEOUS
  Administered 2021-12-26: 7 [IU] via SUBCUTANEOUS
  Administered 2021-12-26: 15 [IU] via SUBCUTANEOUS
  Administered 2021-12-27: 20 [IU] via SUBCUTANEOUS
  Administered 2021-12-27: 15 [IU] via SUBCUTANEOUS
  Administered 2021-12-27: 4 [IU] via SUBCUTANEOUS
  Administered 2021-12-28: 7 [IU] via SUBCUTANEOUS
  Administered 2021-12-28: 15 [IU] via SUBCUTANEOUS
  Administered 2021-12-28: 11 [IU] via SUBCUTANEOUS
  Administered 2021-12-29: 7 [IU] via SUBCUTANEOUS
  Administered 2021-12-29: 11 [IU] via SUBCUTANEOUS
  Administered 2021-12-29: 7 [IU] via SUBCUTANEOUS
  Administered 2021-12-30 (×2): 4 [IU] via SUBCUTANEOUS
  Administered 2021-12-31: 15 [IU] via SUBCUTANEOUS

## 2021-12-24 MED ORDER — METOPROLOL TARTRATE 12.5 MG HALF TABLET
12.5000 mg | ORAL_TABLET | Freq: Four times a day (QID) | ORAL | Status: DC
  Administered 2021-12-24 – 2021-12-25 (×4): 12.5 mg via ORAL
  Filled 2021-12-24 (×4): qty 1

## 2021-12-24 NOTE — Progress Notes (Signed)
Nutrition Follow-up ? ?DOCUMENTATION CODES:  ? ?Severe malnutrition in context of chronic illness ? ?INTERVENTION:  ? ?Vital Cuisine Shake TID with meals, each supplement provides 520 kcal and 22 grams of protein. ? ?MVI with minerals daily. ? ?NUTRITION DIAGNOSIS:  ? ?Severe Malnutrition related to chronic illness (COPD, CAD) as evidenced by severe muscle depletion, severe fat depletion, percent weight loss (13% weight loss within 3 months). ? ?GOAL:  ? ?Patient will meet greater than or equal to 90% of their needs ? ?MONITOR:  ? ?PO intake, Supplement acceptance, Diet advancement, Labs, Skin ? ?REASON FOR ASSESSMENT:  ? ?Ventilator, Consult ?Enteral/tube feeding initiation and management ? ?ASSESSMENT:  ? ?71 yo male admitted with AKI, acute respiratory failure requiring intubation, septic shock, CAP. PMH includes CAD, PAD, DM, HTN, HLD, tobacco abuse, COPD. ? ?Discussed patient in ICU rounds and with RN today. ?Extubated 5/2. ?OGT removed and TF off since extubation.  ?S/P bedside swallow evaluation with SLP this morning. ?Diet advanced to dysphagia 1 with nectar thick liquids. ? ?Patient reports usual weight 130 lbs (a while ago), but he was down to 107 lbs when he went to the doctor's office recently. 18% total weight loss. From review of usual weights, he has lost 13% with the past 3 months. He usually eats 3 meals per day at home. He eats a lot of eggs and hot dogs. Typically drinks thin liquids. He likes chocolate Ensure supplements. Since he is requiring nectar thick liquids, will add nectar thick shakes to meal trays instead of Ensure.  ? ?Labs reviewed. Na 153 ?CBG: [phone] ? ?Medications reviewed and include Colace, Novolog. ?Free water ordered 100 ml every 4 hours via tube; patient is drinking water as no tube available. ? ?Patient meets criteria for severe malnutrition with severe weight loss and severe depletion of muscle and subcutaneous fat mass. ? ?NUTRITION - FOCUSED PHYSICAL  EXAM: ? ?Flowsheet Row Most Recent Value  ?Orbital Region Severe depletion  ?Upper Arm Region Moderate depletion  ?Thoracic and Lumbar Region Severe depletion  ?Buccal Region Severe depletion  ?Temple Region Severe depletion  ?Clavicle Bone Region Severe depletion  ?Clavicle and Acromion Bone Region Moderate depletion  ?Scapular Bone Region Unable to assess  ?Dorsal Hand Mild depletion  ?Patellar Region Severe depletion  ?Anterior Thigh Region Severe depletion  ?Posterior Calf Region Severe depletion  ?Edema (RD Assessment) None  ?Hair Reviewed  ?Eyes Reviewed  ?Mouth Reviewed  ?Skin Reviewed  ?Nails Reviewed  ? ?  ? ? ?Diet Order:   ?Diet Order   ? ?       ?  DIET - DYS 1 Room service appropriate? Yes; Fluid consistency: Nectar Thick  Diet effective now       ?  ? ?  ?  ? ?  ? ? ?EDUCATION NEEDS:  ? ?Education needs have been addressed ? ?Skin:  Skin Assessment: Skin Integrity Issues: ?Skin Integrity Issues:: Stage I ?Stage I: R & L buttocks ? ?Last BM:  5/2 type 6 ? ?Height:  ? ?Ht Readings from Last 1 Encounters:  ?12/20/21 5\' 3"  (1.6 m)  ? ? ?Weight:  ? ?Wt Readings from Last 1 Encounters:  ?12/24/21 49.3 kg  ? ? ? ?BMI:  Body mass index is 19.25 kg/m?. ? ?Estimated Nutritional Needs:  ? ?Kcal:  1700-1900 ? ?Protein:  75-95 gm ? ?Fluid:  1.7 L ? ? ? ?Lucas Mallow RD, LDN, CNSC ?Please refer to Amion for contact information.                                                       ? ?

## 2021-12-24 NOTE — Progress Notes (Signed)
ANTICOAGULATION CONSULT NOTE ? ?Pharmacy Consult for heparin ?Indication: atrial fibrillation ? ?Allergies  ?Allergen Reactions  ? Actos [Pioglitazone] Shortness Of Breath  ?  Leg swelling   ? Atorvastatin   ?  Other reaction(s): leg pain  ? Lisinopril Cough  ? ? ?Patient Measurements: ?Height: 5\' 3"  (160 cm) ?Weight: 49.3 kg (108 lb 11 oz) ?IBW/kg (Calculated) : 56.9 ?Heparin Dosing Weight: 51.2 kg ? ?Vital Signs: ?Temp: 98.7 ?F (37.1 ?C) (05/03 0353) ?Temp Source: Axillary (05/03 0353) ?BP: 157/66 (05/03 0200) ?Pulse Rate: 93 (05/03 0300) ? ?Labs: ?Recent Labs  ?  12/21/21 ?1202 12/21/21 ?1512 12/22/21 ?0130 12/22/21 ?1107 12/22/21 ?2135 12/23/21 ?0459 12/23/21 ?0654 12/23/21 ?1408 12/23/21 ?1946 12/24/21 ?0220  ?HGB  --   --  9.7*  --   --  9.0*  --   --   --  8.4*  ?HCT  --   --  29.6*  --   --  26.8*  --   --   --  26.3*  ?PLT  --   --  228  --   --  211  --   --   --  207  ?HEPARINUNFRC  --   --   --   --    < >  --  0.42 0.33  --  0.28*  ?CREATININE   < >  --  1.82* 1.75*  --  1.71* 1.70*  --  1.62* 1.60*  ?TROPONINIHS  --  736*  --  432*  --  244*  --   --   --   --   ? < > = values in this interval not displayed.  ? ? ? ?Estimated Creatinine Clearance: 29.1 mL/min (A) (by C-G formula based on SCr of 1.6 mg/dL (H)). ? ? ?Medical History: ?Past Medical History:  ?Diagnosis Date  ? Carotid artery disease ([place])   ? L-ICA 100%, mod R-ICA dz  ? COPD (chronic obstructive pulmonary disease) ([place])   ? Coronary artery disease   ? s/p CABG February 2010 by Dr. Merilynn Finland  ? Diabetes ([place])   ? Emphysema lung ([place])   ? Hyperlipidemia   ? Hypertension   ? Peripheral arterial disease ([place])   ? post left common iliac and right SFA stenting remotely  ? Tobacco abuse   ?    ? ? ?Assessment: ?71 yo male with afib. Patient with new stroke. Pharmacy consulted to start heparin drip with low goal and no bolus per CVA protocol. No prior AC noted PTA. ? ?Heparin level of 0.28 is subtherapeutic on heparin 900 units/hr.Hgb  8.4. Plt wnl. No bleeding noted or issues with heparin infusion or access per RN.  ? ? ?Goal of Therapy:  ?Heparin level 0.3-0.5 units/ml ?Monitor platelets by anticoagulation protocol: Yes ?  ?Plan:  ?Increase heparin to 950 units/hr  ?Check 8 hr heparin level ?Monitor heparin level, CBC and s/s of bleeding  ? ?Cristela Felt, PharmD, BCPS ?Clinical Pharmacist ?12/24/2021 7:13 AM ? ?

## 2021-12-24 NOTE — Progress Notes (Addendum)
? ?NAME:  Reginald Ayers, MRN:  [id number], DOB:  1951-03-30, LOS: 4 ?ADMISSION DATE:  12/20/2021, CONSULTATION DATE:  12/20/21 ?REFERRING MD:  EDP, CHIEF COMPLAINT:  Respiratory Failure  ? ?History of Present Illness:  ?Patient is a 71 year old man who presented with increasing shortness of breath, weakness that worsened requiring ED visit. He has hx of COPD, HTN, HLD, CAD, PAD, and tobacco use disorder. Patient feeling off-baseline for a few weeks with fatigue, coughing, and shortness of breath. He presented to the ED via EMS and continued to worsen and required intubation and mechanical ventilation for respiratory failure. Patient had episode of vomiting yesterday.  ?  ?Pertinent  Medical History  ? ? Carotid artery disease ([place])    ?  L-ICA 100%, mod R-ICA dz  ? COPD (chronic obstructive pulmonary disease) ([place])    ? Coronary artery disease    ?  s/p CABG February 2010 by Dr. Merilynn Finland  ? Diabetes ([place])    ? Emphysema lung ([place])    ? Hyperlipidemia    ? Hypertension    ? Peripheral arterial disease ([place])    ?  post left common iliac and right SFA stenting remotely  ? Tobacco abuse   ? ?Significant Hospital Events: ?Including procedures, antibiotic start and stop dates in addition to other pertinent events   ?04/29-Intubated and admitted to the ICU. >> A fib and elevated troponins ?4/30 MRI Brain >> acute stroke in the left hemisphere, Heparin stopped ?5/1 MRA Head, unable to order CTA because of renal function >> ?5/2 extubated  ?Interim History / Subjective:  ? ?Extubated yesterday ?On 4 L [place] sats 99% ?Aox3; following commands; good cough; weaker on RUE and RLE but with improvement ?SLP saw yesterday started on honey thick diet ?  ? ?Objective   ?Blood pressure (!) 160/71, pulse 99, temperature 98.7 ?F (37.1 ?C), temperature source Axillary, resp. rate (!) 22, height 5\' 3"  (1.6 m), weight 49.3 kg, SpO2 99 %. ?   ?Vent Mode: PSV;CPAP ?FiO2 (%):  [40 %] 40 % ?Set Rate:  [22 bmp] 22 bmp ?Vt Set:  [450 mL]  450 mL ?PEEP:  [5 cmH20] 5 cmH20 ?Pressure Support:  [10 cmH20] 10 cmH20 ?Plateau Pressure:  [15 cmH20-23 cmH20] 15 cmH20  ? ?Intake/Output Summary (Last 24 hours) at 12/24/2021 0742 ?Last data filed at 12/24/2021 0700 ?Gross per 24 hour  ?Intake 1121.24 ml  ?Output 2000 ml  ?Net -878.76 ml  ? ? ?Filed Weights  ? 12/21/21 0400 12/23/21 0440 12/24/21 0452  ?Weight: 51.2 kg 52.7 kg 49.3 kg  ? ?Examination: ?General:  NAD ?HEENT: MM pink/dry; [place] in place ?Neuro: Aox3; following commands; good cough; weaker on RUE and RLE but with improvement ?CV: s1s2, no m/r/g ?PULM:  dim clear BS bilaterally; on [place] 4 L ?GI: soft, bsx4 active  ?Extremities: warm/dry, no edema  ?Skin: no rashes or lesions appreciated ? ? ?Consults  ?Neuro ?Resolved Hospital Problem list   ? ?Assessment & Plan:  ? ?Acute respiratory failure with hypoxia ([place]) ?Hx of COPD: no pft's on chart ?P: ?-extubated 5/2 ?-continue to wean [place] for sats >92% ?-continue brovana, yupelri, pulmicort ?-prn albuterol for wheezing ?-pulm toiletry: IS/Flutter ?-PT/OT ? ?Sepsis due to pneumonia [facility]) ?P: ?-wbc trending down; afebrile overnight ?-azithro/rocephin x 5 days ? ?AKI (acute kidney injury) ([place]) ?Acidosis, metabolic ?Hypokalemia ?Hypomagnesemia ?P: ?-replete mag today ?-Trend BMP/ Mag / urinary output ?-Replace electrolytes as indicated ?-Avoid nephrotoxic agents, ensure adequate renal perfusion ? ?Hypernatremia ?P: ?-started dextrose  5/2; will increase rate of dextrose ?-reconsult SLP to evaluate if patient able to drink oral fluids ?-trend na ?  ?Stroke: MRI 4/29 Punctate left temporal infarct likely secondary left ICA chronic occlusion with hypoperfusion ?- heparin and plavix continued. MRA  ordered for 5/1 pending, and CTA not possibility due to renal function  ?P: ?-Neuro following ?-MRA pending; consider CTA if kidney fx improves ?-frequent neuro checks; limit sedating meds ?-cont heparin IV and plavix ?-BP goal per neuro; prn metoprolol ? ?New onset Atrial  Fibrillation ?Acute systolic CHF: Echo 5/1 LVEF 50-55% ?Hx of CABG Feb 2010 ?Hx HTN, HLD, CAD, PAD ?Has hx of CABG and hx of HLD, CAD, and PAD. New onset a-fib most likely in setting of acute illness.  ?P: ?-telemetry monitoring ?-continue plavix ?-heparin gtt ?-prn metoprolol ? ?DMT2 ?Type 2 DM with hyperglycemia. On orals at home.  ?P: ?-continue tf coverage ?-continue SSI and cbg monitoring ? ?Constipation: improved; bm 5/2 ?P: ?-bowel regimen prn ? ?Severe protein-energy malnutrition ([place]) ?Appears cachectic with decrease muscle mass ? P: ?-SLP evaluated; able to tolerate honey thick diet ? ?Right arm edema ?P: ?-Doppler radial  pulse  ?-check cap refill ? ?  ?Best Practice (right click and "Reselect all SmartList Selections" daily)  ?Diet/type: tubefeeds ?DVT prophylaxis: systemic heparin ?GI prophylaxis: PPI ?Lines: N/A ?Foley:  N/A ?Continuous: Propofol on hold this am ?Code Status:  full code ?Last date of multidisciplinary goals of care discussion [5/3 spoke with niece Elvin So and updated over phone] ? ? ?Mikki Harbor, PA-C ?[facility] ?12/24/2021, 7:42 AM ? ?Please see [email or web address] for pager details. ? ?From 7A-7P if no response, please call [phone]. ?After hours, please call Warren Lacy [phone]. ? ? ?  ?

## 2021-12-24 NOTE — Progress Notes (Signed)
Speech Language Pathology Treatment: Dysphagia  ?Patient Details ?Name: Reginald Ayers ?MRN: [id number] ?DOB: 02/27/1951 ?Today's Date: 12/24/2021 ?Time: 8250-5397 ?SLP Time Calculation (min) (ACUTE ONLY): 20 min ? ?Assessment / Plan / Recommendation ?Clinical Impression ? Patient seen by SLP for skilled treatment session focused on dysphagia goals. RN in room and she reports that patient is a lot more awake and alert than he was yesterday. SLP observed patient take medications whole in applesauce, take straw sips of nectar thickened liquids and although he exhibited a mildly delayed anterior to posterior transit of puree solids when taken with medications, he did not exhibit any overt s/s aspiration or penetration. Suspect swallow initiation is slightly delayed with liquids. SLP observed patient with straw sips of thin liquids (water, juice) and although he did not exhibit any immediate cough or throat clear, he did exhibit intermittent delayed coughing. SLP is recommending to initiate PO diet of Dys 1 (puree solids) and nectar thick liquids. SLP will follow for ability to advance diet, determine need for objective swallow study and ensure PO toleration. ? ?  ?HPI HPI: 71 yo male presented 4/29 with sepsis secondary to CAP, requiring intubation and mechanical ventilation for respiratory failure. ETT 4/29-5/2. MRI: punctate left temporal infarct; chronic cortical infarcts in the left middle and superior frontal gyri.  History of bilateral carotid artery stenosis (total occlusion of the left, moderate stenosis of the right), HTN, HLD, prior tobacco abuse, diabetes, coronary artery disease s/p CABG, peripheral vascular disease s/p stents. ?  ?   ?SLP Plan ? Continue with current plan of care ? ?  ?  ?Recommendations for follow up therapy are one component of a multi-disciplinary discharge planning process, led by the attending physician.  Recommendations may be updated based on patient status, additional functional  criteria and insurance authorization. ?  ? ?Recommendations  ?Diet recommendations: Dysphagia 1 (puree);Nectar-thick liquid ?Liquids provided via: Cup;Straw ?Medication Administration: Whole meds with puree ?Supervision: Patient able to self feed;Full supervision/cueing for compensatory strategies ?Compensations: Small sips/bites;Slow rate;Minimize environmental distractions ?Postural Changes and/or Swallow Maneuvers: Seated upright 90 degrees  ?   ?    ?   ? ? ? ? Oral Care Recommendations: Oral care BID;Staff/trained caregiver to provide oral care ?Follow Up Recommendations: Other (comment) (TBD) ?Assistance recommended at discharge: Frequent or constant Supervision/Assistance ?SLP Visit Diagnosis: Dysphagia, unspecified (R13.10) ?Plan: Continue with current plan of care ? ? ? ? ?  ?  ? ? ?Angela Nevin, MA, CCC-SLP ?Speech Therapy ? ?

## 2021-12-24 NOTE — Consult Note (Addendum)
Hospital Consult    Reason for Consult: Carotid artery stenosis Requesting Physician:  Dr. Roda Shutters MRN #:  [id number]  History of Present Illness: This is a 71 y.o. male with past medical history significant for hypertension, hyperlipidemia, prior tobacco abuse, diabetes, CAD status post CABG, PAD, and COPD who presented to the ED with increasing shortness of breath and weakness.  He was eventually intubated for respiratory failure.  He is being seen in consultation for evaluation of carotid artery stenosis.  During hospitalization work-up also demonstrated left-sided CVA.  He has known left ICA occlusion.  Carotid duplex demonstrated 80 to 99% stenosis of the right ICA.  He denies any left-sided weakness.  He is currently on IV heparin for atrial fibrillation.  He has already been started on Plavix and aspirin.  Past medical history also significant for CKD.  For this reason he has not yet had a CTA head and neck.  Past Medical History:  Diagnosis Date   Carotid artery disease (HCC)    L-ICA 100%, mod R-ICA dz   COPD (chronic obstructive pulmonary disease) (HCC)    Coronary artery disease    s/p CABG February 2010 by Dr. Andrey Spearman   Diabetes [facility])    Emphysema lung [facility])    Hyperlipidemia    Hypertension    Peripheral arterial disease (HCC)    post left common iliac and right SFA stenting remotely   Tobacco abuse         Past Surgical History:  Procedure Laterality Date   ABDOMINAL AORTOGRAM N/A 04/20/2017   Procedure: ABDOMINAL AORTOGRAM;  Surgeon: Nada Libman, MD;  [facility]: [facility] IN[facility];  Service: Cardiovascular;  Laterality: N/A;   ABDOMINAL AORTOGRAM W/LOWER EXTREMITY Right 12/08/2016   Procedure: Abdominal Aortogram w/Lower Extremity;  Surgeon: Nada Libman, MD;  Location: [facility];  Service: Cardiovascular;  Laterality: Right;   ABDOMINAL AORTOGRAM W/LOWER EXTREMITY N/A 09/07/2017   Procedure: ABDOMINAL AORTOGRAM W/LOWER EXTREMITY;  Surgeon: Nada Libman, MD;  Location: [facility];  Service: Cardiovascular;  Laterality: N/A;   CARDIAC CATHETERIZATION  2010   CAROTID ANGIOGRAM  2014   CORONARY ARTERY BYPASS GRAFT  2010   LIMA-LAD, Lrad-OM1, SVG-RI, SVG-AM-dRCA   DOPPLER ECHOCARDIOGRAPHY  2010   LOWER EXTREMITY ANGIOGRAM  11/2006   left common femoral endarterectomy and patch angioplasty:The mid right SFA was angioplastied with a 4 x 8   LOWER EXTREMITY ANGIOGRAM  2003   left common iliac artery stenting by Dr. Erlene Quan   LOWER EXTREMITY ANGIOGRAPHY Right 04/20/2017   Procedure: Lower Extremity Angiography;  Surgeon: Nada Libman, MD;  Location: [facility] INVASIVE CV LAB;  Service: Cardiovascular;  Laterality: Right;   NM MYOVIEW LTD  2015   PERIPHERAL VASCULAR ATHERECTOMY Right 12/08/2016   Procedure: Peripheral Vascular Atherectomy;  Surgeon: Nada Libman, MD;  [facility]: [facility];  Service: Cardiovascular;  Laterality: Right;   PERIPHERAL VASCULAR BALLOON ANGIOPLASTY Right 12/08/2016   Procedure: Peripheral Vascular Balloon Angioplasty;  Surgeon: Nada Libman, MD;  [facility]: [facility];  Service: Cardiovascular;  Laterality: Right;   PERIPHERAL VASCULAR BALLOON ANGIOPLASTY Right 04/20/2017   Procedure: PERIPHERAL VASCULAR BALLOON ANGIOPLASTY;  Surgeon: Nada Libman, MD;  [facility]: [facility];  Service: Cardiovascular;  Laterality: Right;  SFA   PERIPHERAL VASCULAR CATHETERIZATION N/A 09/22/2016   Procedure: Abdominal Aortogram w/ bilateral Lower Extremity Runoff;  Surgeon: Nada Libman, MD;  [facility]: [facility];  Service:  Cardiovascular;  Laterality: N/A;   PERIPHERAL VASCULAR CATHETERIZATION Right 09/22/2016   Procedure: Peripheral Vascular Intervention;  Surgeon: Nada Libman, MD;  Location: [facility];  Service: Cardiovascular;  Laterality: Right;    Allergies  Allergen Reactions   Actos [Pioglitazone] Shortness Of Breath    Leg swelling    Atorvastatin     Other  reaction(s): leg pain   Lisinopril Cough    Prior to Admission medications   Medication Sig Start Date End Date Taking? Authorizing Provider  albuterol (VENTOLIN HFA) 108 (90 Base) MCG/ACT inhaler Inhale 2 puffs into the lungs every 4 (four) hours as needed. 01/18/20  Yes [provider]  clopidogrel (PLAVIX) 75 MG tablet Take 75 mg by mouth daily.  07/24/13  Yes [provider]  Cyanocobalamin (VITAMIN B 12) 500 MCG TABS Take 1,000 mcg by mouth daily.   Yes [provider]  empagliflozin (JARDIANCE) 25 MG TABS tablet Take 25 mg by mouth daily.   Yes [provider]  ferrous sulfate 324 MG TBEC Take 324 mg by mouth daily with breakfast.   Yes [provider]  furosemide (LASIX) 20 MG tablet Take 20 mg by mouth daily.   Yes [provider]  Homeopathic Products (LEG CRAMP RELIEF SL) Place 2 tablets under the tongue at bedtime as needed (leg cramps).   Yes [provider]  ibuprofen (ADVIL,MOTRIN) 200 MG tablet Take 400-800 mg by mouth every 8 (eight) hours as needed for moderate pain.   Yes [provider]  losartan (COZAAR) 50 MG tablet Take 50 mg by mouth daily. 07/10/21  Yes [provider]  metFORMIN (GLUCOPHAGE) 500 MG tablet Take 1,000 mg by mouth 2 (two) times daily with a meal.  07/24/13  Yes [provider]  metoprolol succinate (TOPROL-XL) 25 MG 24 hr tablet TAKE 1 TABLET(25 MG) BY MOUTH DAILY Patient taking differently: Take 25 mg by mouth daily. 07/29/20  Yes Runell Gess, MD  Multiple Vitamins-Minerals (CENTRUM SILVER 50+MEN) TABS Take 1 tablet by mouth daily.   Yes [provider]  NITROSTAT 0.4 MG SL tablet Take 0.4 mg by mouth every 5 (five) minutes as needed for chest pain.  12/18/14  Yes [provider]  Polyvinyl Alcohol-Povidone (MURINE TEARS FOR DRY EYES OP) Apply 1 drop to eye 3 (three) times daily as needed (dry eyes).   Yes [provider]  potassium  chloride (K-DUR) 10 MEQ tablet Take 10 mEq by mouth daily.   Yes [provider]  Pseudoeph-Doxylamine-DM-APAP (NYQUIL PO) Take 1 Dose by mouth at bedtime.    Yes [provider]  SPIRIVA HANDIHALER 18 MCG inhalation capsule Place 18 mcg into inhaler and inhale daily at 8 pm.  08/16/13  Yes [provider]  traMADol (ULTRAM) 50 MG tablet Take 50 mg by mouth every 12 (twelve) hours as needed for severe pain. 08/16/13  Yes [provider]    Social History   Socioeconomic History   Marital status: Single    Spouse name: Not on file   Number of children: Not on file   Years of education: Not on file   Highest education level: Not on file  Occupational History   Occupation: Retired [employer]  Tobacco Use   Smoking status: Former    Packs/day: 0.00    Types: Cigarettes    Quit date: 08/07/2016    Years since quitting: 5.3   Smokeless tobacco: Current    Types: Chew  Vaping Use  Vaping Use: Never used  Substance and Sexual Activity   Alcohol use: No    Alcohol/week: 0.0 standard drinks    Comment: Weekend drinker, quit 2000. NIGHTLY [place]   Drug use: No   Sexual activity: Not on file  Other Topics Concern   Not on file  Social History Narrative   Lives in [place]. Has 2 roommates, they both smoke. Has 9 dogs.   Social Determinants of Health   Financial Resource Strain: Not on file  Food Insecurity: Not on file  Transportation Needs: Not on file  Physical Activity: Not on file  Stress: Not on file  Social Connections: Not on file  Intimate Partner Violence: Not on file     Family History  Problem Relation Age of Onset   Heart attack Mother    CVA Mother    Heart disease Mother    Diabetes Sister    Hypertension Sister     ROS: Otherwise negative unless mentioned in HPI  Physical Examination  Vitals:   12/24/21 1300 12/24/21 1400  BP: (!) 152/76   Pulse: (!) 114 (!) 120  Resp: (!) 23 (!) 22  Temp:    SpO2: 98% 95%    Body mass index is 19.25 kg/m.  General:  WDWN in NAD Gait: Not observed HENT: WNL, normocephalic Pulmonary: normal non-labored breathing on O2 by [place] Cardiac: irregular Abdomen:  soft, NT/ND, no masses Skin: without rashes Vascular Exam/Pulses: Feet are symmetrically warm with good capillary refill.  No palpable pedal pulses Extremities: without ischemic changes, without Gangrene , without cellulitis; without open wounds;  Musculoskeletal: no muscle wasting or atrophy  Neurologic: A&O X 3; weak but symmetrical grip strength Psychiatric:  The pt has Normal affect. Lymph:  Unremarkable  CBC    Component Value Date/Time   WBC 8.7 12/24/2021 0220   RBC 2.76 (L) 12/24/2021 0220   HGB 8.4 (L) 12/24/2021 0220   HCT 26.3 (L) 12/24/2021 0220   PLT 207 12/24/2021 0220   MCV 95.3 12/24/2021 0220   MCH 30.4 12/24/2021 0220   MCHC 31.9 12/24/2021 0220   RDW 15.1 12/24/2021 0220   LYMPHSABS 0.7 12/23/2021 0459   MONOABS 0.5 12/23/2021 0459   EOSABS 0.0 12/23/2021 0459   BASOSABS 0.0 12/23/2021 0459    BMET    Component Value Date/Time   NA 153 (H) 12/24/2021 0220   K 4.0 12/24/2021 0220   CL 118 (H) 12/24/2021 0220   CO2 23 12/24/2021 0220   GLUCOSE 169 (H) 12/24/2021 0220   BUN 40 (H) 12/24/2021 0220   CREATININE 1.60 (H) 12/24/2021 0220   CALCIUM 7.9 (L) 12/24/2021 0220   GFRNONAA 45 (L) 12/24/2021 0220   GFRAA >60 09/25/2017 2016    COAGS: Lab Results  Component Value Date   INR 1.5 10/26/2008   INR 1.1 10/24/2008     Non-Invasive Vascular Imaging:   Left ICA occluded by duplex Right ICA 80 to 99% stenosis by duplex   ASSESSMENT/PLAN: This is a 71 y.o. male with acute left-sided CVA and known left ICA occlusion; right ICA 80 to 99% stenosis  -Subjectively patient denies any further strokelike symptoms including one-sided weakness -Work-up demonstrated left-sided CVA with left ICA occlusion.  No indication for revascularization of left ICA.  Patient also has  asymptomatic right ICA 80 to 99% stenosis.  I discussed with the patient that he will likely require revascularization of right ICA -Continue aspirin, Plavix, statin -Awaiting improvement of renal function prior to CTA head and neck.  Patient may be a candidate for right-sided TCAR after discussion with on-call vascular surgeon Dr. Myra Gianotti.  This can potentially be performed on Friday 5/5 if CTA is completed before then.  Dr. Myra Gianotti will evaluate the patient later today and provide further treatment plans   Emilie Rutter PA-C [facility] [phone]   I agree with the above.  Have seen and evaluated the patient.  I plan on getting a CT angiogram to better evaluate his carotid disease to see if he is a candidate for TCAR.  He does have chronic renal insufficiency, so he will need to be hydrated prior to his scan which can be done with reduced dose contrast.  I will follow-up once his CAT scan has been performed.  [name]

## 2021-12-24 NOTE — Progress Notes (Addendum)
STROKE TEAM PROGRESS NOTE  ? ?INTERVAL HISTORY ?Pt extubated yesterday, doing well. Awake alert and orientated x 3. Mildly lethargic, told me the left arm proximal weakness was old and has been going on for many years. Pending MRA. Will consult Dr. Trula Slade for carotid stenosis.  ? ?Vitals:  ? 12/24/21 1400 12/24/21 1500 12/24/21 1526 12/24/21 1600  ?BP: (!) 160/81 (!) 162/73  (!) 165/81  ?Pulse: (!) 120 (!) 117  (!) 118  ?Resp: (!) 22 (!) 23  (!) 26  ?Temp:   (!) 100.5 ?F (38.1 ?C)   ?TempSrc:   Oral   ?SpO2: 95% 95%  96%  ?Weight:      ?Height:      ? ?CBC:  ?Recent Labs  ?Lab 12/20/21 ?1116 12/20/21 ?1127 12/23/21 ?0459 12/24/21 ?0220  ?WBC 18.8*   < > 11.2* 8.7  ?NEUTROABS 16.6*  --  9.9*  --   ?HGB 10.4*   < > 9.0* 8.4*  ?HCT 33.5*   < > 26.8* 26.3*  ?MCV 95.7   < > 90.8 95.3  ?PLT 284   < > 211 207  ? < > = values in this interval not displayed.  ? ?Basic Metabolic Panel:  ?Recent Labs  ?Lab 12/23/21 ?0459 12/23/21 ?0654 12/23/21 ?1946 12/24/21 ?0220  ?NA 151*   < > 152* 153*  ?K 3.0*   < > 4.3 4.0  ?CL 118*   < > 117* 118*  ?CO2 21*   < > 22 23  ?GLUCOSE 263*   < > 157* 169*  ?BUN 46*   < > 42* 40*  ?CREATININE 1.71*   < > 1.62* 1.60*  ?CALCIUM 7.7*   < > 7.6* 7.9*  ?MG 2.0  --   --  1.8  ?PHOS 5.5*  --   --  3.1  ? < > = values in this interval not displayed.  ? ?Lipid Panel:  ?Recent Labs  ?Lab 12/21/21 ?1240 12/24/21 ?0220  ?CHOL 85  --   ?TRIG 160* 117  ?HDL 32*  --   ?CHOLHDL 2.7  --   ?VLDL 32  --   ?[place] 21  --   ? ?HgbA1c:  ?Recent Labs  ?Lab 12/21/21 ?2246  ?HGBA1C 8.1*  ? ?Urine Drug Screen: No results for input(s): LABOPIA, COCAINSCRNUR, LABBENZ, AMPHETMU, THCU, LABBARB in the last 168 hours.  ?Alcohol Level No results for input(s): ETH in the last 168 hours. ? ?IMAGING past 24 hours ?No results found. ? ?PHYSICAL EXAM ?Constitutional: Appears chronically ill, cachectic ?Cardiovascular: Normal rate and regular rhythm at the time of my eval.  Perfusing extremities well ?Respiratory: no distress,  intermittent coughing ?  ?Neuro:patient awake alert, eyes open, hard of hearing, able to tell me place, his name and age but wrong on month.  Able to repeat simple sentences but dysarthric, follows simple commands.  Blinking to visual threat bilaterally, no gaze palsy, tracking bilaterally.  Left mild facial droop.  Left upper extremity proximal not able to against gravity which is old per pt, bicep and hand grip symmetric to the right, right upper extremity proximal no drift.  Bilateral lower extremity 3/5 symmetrical proximally, distal 4/5. Sensation subjective symmetrical, finger-to-nose grossly intact bilaterally and gait not tested. ? ?ASSESSMENT/PLAN ?Reginald Ayers is a 71 y.o. male with history of bilateral carotid artery stenosis (total occlusion of the left, moderate stenosis of the right), hypertension, hyperlipidemia, prior tobacco abuse, diabetes, coronary artery disease s/p CABG, peripheral vascular disease s/p stents, COPD presenting  with generalized weakness over the past 3 weeks.  On admission 4/29, he was found to be septic secondary to a community-acquired pneumonia, he was intubated and started on antibiotics.  He developed A-fib with RVR, became hypotensive and required vasopressors.  He has since converted back into normal sinus rhythm and is no longer requiring pressors. CT no acute finding.  MRI showed left temporal lobe white matter small infarct. Awaiting MRA, carotid duplex shows Rt ICA 80-99% occlusion and Lt ICA chronically occluded.  ? ?Stroke: Punctate left temporal infarct likely secondary left ICA chronic occlusion with hypoperfusion. Afib as etiology possible but less likely given left ICA occluded. ?MRI - Left ICA with poor flow or occlusion in the neck and at the skull base. Evidence of reconstituted flow at the Left ICA terminus by MRI, but also asymmetric decreased flow in Left MCA branches. However, only trace acute ischemia is identified in the Left MCA territory (left  temporal lobe white matter) with chronic cortical infarcts in the left middle and superior frontal gyri. Evidence of some underlying chronic small vessel disease including in the pons. ?CTA head and neck once Cre normalized ?Carotid Doppler left ICA chronically occluded, right ICA 80 to 90% stenosis ?2D Echo EF 50 to 55% ?LDL 21 ?HgbA1c 8.1 ?VTE prophylaxis - Heparin IV ?clopidogrel 75 mg daily prior to admission, now on clopidogrel 75 mg daily and heparin IV.  ?Therapy recommendations:  pending ?Disposition:  pending ? ?A-fib RVR, new diagnosis ?Now converted to NSR ?On heparin IV ?Continue heparin IV now with potential right TCAR procedure ? ?Carotid stenosis/occlusion ?Left ICA chronic occlusion ?Right ICA stenosis 07/2019 60 to 79% stenosis.  This admission carotid Doppler 80 to 99% stenosis. ?May consider right carotid revascularization in the near future  ?Dr. Trula Slade on board ?On Plavix and heparin IV now ? ?Elevated troponin ?Trop 881 -> 736 ?On Heparin IV and plavix ?CCM on board ? ?Sepsis d/t PNA ?On rocephin and azithromycin ?Febrile with leukocytosis  ?WBC 14.2->14.0->11.2->8.7 ?Tmax 99.7->100.5 ? ?Respiratory failure with hypoxia ?Intubated, vent management per CCM ?Extubated on 5/2 ?Tolerating well ? ?AKI, improving ?Cr baseline 1.42 ?Cr 2.03-> 1.82->1.70->1.60 ?Bicarb drip d/c'd ? ?Hypotension ?Related to sedation versus sepsis and A-fib with RVR ?Off Norepinephrine now ? ?Diabetes type II Uncontrolled ?Home meds: Jardiance, metformin ?HgbA1c 8.1, goal < 7.0 ?CBGs ?SSI ? ?Other Stroke Risk Factors ?Hypertension ?Home med: Losartan, metoprolol succinate ?Advanced Age >/= 52  ?Former cigarette smoker ?Coronary artery disease ?S/p CABG 09/2008  ?Congestive heart failure ?Home meds: Lasix ?Echo 50-55% ? ?Other Active Problems ?Peripheral arterial disease ?Post left common iliac and right SFA stenting  ?Cont plavix ?Protein calorie malnutrition ?NPO now just after extubation ? ?Hospital day # 4 ? ? ?Rosalin Hawking, MD PhD ?Stroke Neurology ?12/24/2021 ?4:23 PM ? ?This patient is critically ill due to stroke, A-fib RVR, bilateral carotid stenosis/occlusion, PAD, respiratory failure and at significant risk of neurological worsening, death form recurrent stroke, hemorrhagic transformation, kidney failure. This patient's care requires constant monitoring of vital signs, hemodynamics, respiratory and cardiac monitoring, review of multiple databases, neurological assessment, discussion with family, other specialists and medical decision making of high complexity. I spent 35 minutes of neurocritical care time in the care of this patient.  I discussed with Dr. Ouida Sills CCM and Dr. Trula Slade VVS. ? ? ? ? ?To contact Stroke Continuity provider, please refer to [email or web address] ?After hours, contact General Neurology ? ?

## 2021-12-24 NOTE — Progress Notes (Signed)
ANTICOAGULATION CONSULT NOTE ? ?Pharmacy Consult for heparin ?Indication: atrial fibrillation ? ?Allergies  ?Allergen Reactions  ? Actos [Pioglitazone] Shortness Of Breath  ?  Leg swelling   ? Atorvastatin   ?  Other reaction(s): leg pain  ? Lisinopril Cough  ? ? ?Patient Measurements: ?Height: 5\' 3"  (160 cm) ?Weight: 49.3 kg (108 lb 11 oz) ?IBW/kg (Calculated) : 56.9 ?Heparin Dosing Weight: 51.2 kg ? ?Vital Signs: ?Temp: 100.5 ?F (38.1 ?C) (05/03 1526) ?Temp Source: Oral (05/03 1526) ?BP: 165/81 (05/03 1600) ?Pulse Rate: 118 (05/03 1600) ? ?Labs: ?Recent Labs  ?  12/22/21 ?0130 12/22/21 ?1107 12/22/21 ?2135 12/23/21 ?0459 12/23/21 ?0654 12/23/21 ?1408 12/23/21 ?1946 12/24/21 ?0220 12/24/21 ?1644 12/24/21 ?1801  ?HGB 9.7*  --   --  9.0*  --   --   --  8.4*  --   --   ?HCT 29.6*  --   --  26.8*  --   --   --  26.3*  --   --   ?PLT 228  --   --  211  --   --   --  207  --   --   ?HEPARINUNFRC  --   --    < >  --    < > 0.33  --  0.28*  --  0.19*  ?CREATININE 1.82* 1.75*  --  1.71*   < >  --  1.62* 1.60* 1.53*  --   ?TROPONINIHS  --  432*  --  244*  --   --   --   --   --   --   ? < > = values in this interval not displayed.  ? ? ? ?Estimated Creatinine Clearance: 30.4 mL/min (A) (by C-G formula based on SCr of 1.53 mg/dL (H)). ? ? ?Medical History: ?Past Medical History:  ?Diagnosis Date  ? Carotid artery disease ([place])   ? L-ICA 100%, mod R-ICA dz  ? COPD (chronic obstructive pulmonary disease) ([place])   ? Coronary artery disease   ? s/p CABG February 2010 by Dr. Merilynn Finland  ? Diabetes ([place])   ? Emphysema lung ([place])   ? Hyperlipidemia   ? Hypertension   ? Peripheral arterial disease ([place])   ? post left common iliac and right SFA stenting remotely  ? Tobacco abuse   ?    ? ? ?Assessment: ?71 yo male with afib. Patient with new stroke. Pharmacy consulted to start heparin drip with low goal and no bolus per CVA protocol. No prior AC noted PTA. ? ?Heparin level down to 0.19 on heparin 950 units/hr ? ?Goal of Therapy:   ?Heparin level 0.3-0.5 units/ml ?Monitor platelets by anticoagulation protocol: Yes ?  ?Plan:  ?Increase heparin to 1100 units/hr  ?Heparin level and CBC in am ? ?Hildred Laser, PharmD ?Clinical Pharmacist ?**Pharmacist phone directory can now be found on [email or web address] ([email or web address]).  Listed under [place]. ?' ? ?

## 2021-12-25 ENCOUNTER — Inpatient Hospital Stay (HOSPITAL_COMMUNITY): Payer: Medicare Other

## 2021-12-25 DIAGNOSIS — I63132 Cerebral infarction due to embolism of left carotid artery: Secondary | ICD-10-CM | POA: Diagnosis not present

## 2021-12-25 DIAGNOSIS — I2510 Atherosclerotic heart disease of native coronary artery without angina pectoris: Secondary | ICD-10-CM | POA: Diagnosis not present

## 2021-12-25 DIAGNOSIS — I480 Paroxysmal atrial fibrillation: Secondary | ICD-10-CM

## 2021-12-25 DIAGNOSIS — Z01810 Encounter for preprocedural cardiovascular examination: Secondary | ICD-10-CM

## 2021-12-25 DIAGNOSIS — J189 Pneumonia, unspecified organism: Secondary | ICD-10-CM | POA: Diagnosis not present

## 2021-12-25 DIAGNOSIS — N179 Acute kidney failure, unspecified: Secondary | ICD-10-CM | POA: Diagnosis not present

## 2021-12-25 DIAGNOSIS — J9601 Acute respiratory failure with hypoxia: Secondary | ICD-10-CM | POA: Diagnosis not present

## 2021-12-25 LAB — CULTURE, BLOOD (ROUTINE X 2)
Culture: NO GROWTH
Culture: NO GROWTH
Special Requests: ADEQUATE

## 2021-12-25 LAB — BASIC METABOLIC PANEL
Anion gap: 14 (ref 5–15)
BUN: 31 mg/dL — ABNORMAL HIGH (ref 8–23)
CO2: 22 mmol/L (ref 22–32)
Calcium: 8.1 mg/dL — ABNORMAL LOW (ref 8.9–10.3)
Chloride: 107 mmol/L (ref 98–111)
Creatinine, Ser: 1.52 mg/dL — ABNORMAL HIGH (ref 0.61–1.24)
GFR, Estimated: 48 mL/min — ABNORMAL LOW (ref >60)
Glucose, Bld: 224 mg/dL — ABNORMAL HIGH (ref 70–99)
Potassium: 3.2 mmol/L — ABNORMAL LOW (ref 3.5–5.1)
Sodium: 143 mmol/L (ref 135–145)

## 2021-12-25 LAB — CBC
HCT: 30.7 % — ABNORMAL LOW (ref 39.0–52.0)
Hemoglobin: 9.8 g/dL — ABNORMAL LOW (ref 13.0–17.0)
MCH: 29.9 pg (ref 26.0–34.0)
MCHC: 31.9 g/dL (ref 30.0–36.0)
MCV: 93.6 fL (ref 80.0–100.0)
Platelets: 207 K/uL (ref 150–400)
RBC: 3.28 MIL/uL — ABNORMAL LOW (ref 4.22–5.81)
RDW: 14.6 % (ref 11.5–15.5)
WBC: 8.8 K/uL (ref 4.0–10.5)
nRBC: 0 % (ref 0.0–0.2)

## 2021-12-25 LAB — GLUCOSE, CAPILLARY
Glucose-Capillary: 221 mg/dL — ABNORMAL HIGH (ref 70–99)
Glucose-Capillary: 278 mg/dL — ABNORMAL HIGH (ref 70–99)
Glucose-Capillary: 218 mg/dL — ABNORMAL HIGH (ref 70–99)
Glucose-Capillary: 142 mg/dL — ABNORMAL HIGH (ref 70–99)
Glucose-Capillary: 359 mg/dL — ABNORMAL HIGH (ref 70–99)

## 2021-12-25 LAB — PHOSPHORUS: Phosphorus: 2.4 mg/dL — ABNORMAL LOW (ref 2.5–4.6)

## 2021-12-25 LAB — HEPARIN LEVEL (UNFRACTIONATED)
Heparin Unfractionated: 0.22 [IU]/mL — ABNORMAL LOW (ref 0.30–0.70)
Heparin Unfractionated: 0.46 [IU]/mL (ref 0.30–0.70)
Heparin Unfractionated: 0.52 IU/mL (ref 0.30–0.70)

## 2021-12-25 LAB — MAGNESIUM: Magnesium: 2.1 mg/dL (ref 1.7–2.4)

## 2021-12-25 MED ORDER — ASPIRIN EC 81 MG PO TBEC
81.0000 mg | DELAYED_RELEASE_TABLET | Freq: Every day | ORAL | Status: DC
  Administered 2021-12-25 – 2021-12-31 (×7): 81 mg via ORAL
  Filled 2021-12-25 (×7): qty 1

## 2021-12-25 MED ORDER — ROSUVASTATIN CALCIUM 20 MG PO TABS
20.0000 mg | ORAL_TABLET | Freq: Every day | ORAL | Status: DC
  Administered 2021-12-25 – 2021-12-31 (×7): 20 mg via ORAL
  Filled 2021-12-25 (×7): qty 1

## 2021-12-25 MED ORDER — POTASSIUM CHLORIDE CRYS ER 20 MEQ PO TBCR
40.0000 meq | EXTENDED_RELEASE_TABLET | Freq: Once | ORAL | Status: AC
  Administered 2021-12-25: 40 meq via ORAL
  Filled 2021-12-25: qty 2

## 2021-12-25 MED ORDER — METOPROLOL TARTRATE 25 MG PO TABS
25.0000 mg | ORAL_TABLET | Freq: Two times a day (BID) | ORAL | Status: DC
  Administered 2021-12-25 – 2021-12-28 (×6): 25 mg via ORAL
  Filled 2021-12-25 (×6): qty 1

## 2021-12-25 MED ORDER — IOHEXOL 350 MG/ML SOLN
100.0000 mL | Freq: Once | INTRAVENOUS | Status: AC | PRN
  Administered 2021-12-25: 50 mL via INTRAVENOUS

## 2021-12-25 NOTE — Progress Notes (Signed)
ANTICOAGULATION CONSULT NOTE ?Pharmacy Consult for heparin ?Indication: atrial fibrillation ?Brief A/P: Heparin level is therapeutic ? ?Allergies  ?Allergen Reactions  ? Actos [Pioglitazone] Shortness Of Breath  ?  Leg swelling   ? Atorvastatin   ?  Other reaction(s): leg pain  ? Lisinopril Cough  ? ? ?Patient Measurements: ?Height: 5\' 3"  (160 cm) ?Weight: 55.5 kg (122 lb 5.7 oz) ?IBW/kg (Calculated) : 56.9 ?Heparin Dosing Weight: 51.2 kg ? ?Vital Signs: ?Temp: 99.7 ?F (37.6 ?C) (05/04 1146) ?Temp Source: Oral (05/04 1146) ?BP: 112/61 (05/04 1146) ?Pulse Rate: 114 (05/04 1146) ? ?Labs: ?Recent Labs  ?  12/23/21 ?0459 12/23/21 ?0654 12/24/21 ?0220 12/24/21 ?1644 12/24/21 ?1801 12/25/21 ?0202 12/25/21 ?1149  ?HGB 9.0*  --  8.4*  --   --  9.8*  --   ?HCT 26.8*  --  26.3*  --   --  30.7*  --   ?PLT 211  --  207  --   --  207  --   ?HEPARINUNFRC  --    < > 0.28*  --  0.19* 0.22* 0.46  ?CREATININE 1.71*   < > 1.60* 1.53*  --  1.52*  --   ?TROPONINIHS 244*  --   --   --   --   --   --   ? < > = values in this interval not displayed.  ? ? ? ?Estimated Creatinine Clearance: 34.5 mL/min (A) (by C-G formula based on SCr of 1.52 mg/dL (H)). ? ?Assessment: ?71 y.o. male with Afib and new CVA for heparin ? ?Goal of Therapy:  ?Heparin level 0.3-0.5 units/ml ?Monitor platelets by anticoagulation protocol: Yes ?  ?Plan:  ?Continue Heparin at 1250  units/hr ?Check heparin level in 6 hours. ? ?Fayola Meckes BS, PharmD, BCPS ?Clinical Pharmacist ?12/25/2021 12:54 PM ? ?Contact: [phone] after 3 PM ? ?"Be curious, not judgmental..." [phone] ?

## 2021-12-25 NOTE — Progress Notes (Signed)
Physical Therapy Treatment ?Patient Details ?Name: Reginald Ayers ?MRN: [id number] ?DOB: 1951/07/11 ?Today's Date: 12/25/2021 ? ? ?History of Present Illness The pt is a 71 yo male presenting 4/29 with increasing shortness of breath and required intubation and mechanical ventilation for respiratory failure. MRI shows left ICA with poor flow and evidence of reconstitued flow at L ICA terminus, MRA pending.  History of bilateral carotid artery stenosis (total occlusion of the left, moderate stenosis of the right), HTN, HLD, prior tobacco abuse, diabetes, coronary artery disease s/p CABG, peripheral vascular disease s/p stents.  Pt extubated on 5/2.  CTA (+) for L ICA occlusion, R ICA sever stenosis.  Planning for R carotid TCAR tomorrow. ? ?  ?PT Comments  ? ? Pt is more alert today and is willing to participate in sitting on EOB in order to eat breakfast.  Pt requires less assist with bed mobility but demos fair sitting balance with L sided lean that is more pronounced as pt fatigues.  HR remains elevated throughout tx session, but pt remains asymptomatic at this time.  Pt demos good tolerance to initiation of LE therex and is able to complete with supervision only.  D/C plan remains appropriate at this time.  Per MD notes, planning for R carotid TCAR tomorrow.  ?Recommendations for follow up therapy are one component of a multi-disciplinary discharge planning process, led by the attending physician.  Recommendations may be updated based on patient status, additional functional criteria and insurance authorization. ? ?Follow Up Recommendations ? Skilled nursing-short term rehab (<3 hours/day) ?  ?  ?Assistance Recommended at Discharge    ?Patient can return home with the following A lot of help with walking and/or transfers;A lot of help with bathing/dressing/bathroom;Assistance with cooking/housework;Direct supervision/assist for financial management;Assist for transportation;Help with stairs or ramp for entrance ?   ?Equipment Recommendations ? Rolling walker (2 wheels);BSC/3in1  ?  ?Recommendations for Other Services   ? ? ?  ?Precautions / Restrictions Precautions ?Precautions: Other (comment) ?Precaution Comments: monitor HR  ?  ? ?Mobility ? Bed Mobility ?Overal bed mobility: Needs Assistance ?Bed Mobility: Rolling, Supine to Sit, Sit to Supine ?Rolling: Min assist, Max assist (Min A to roll to L side, max A to R side) ?  ?Supine to sit: Mod assist ?Sit to supine: Mod assist ?  ?General bed mobility comments: Pt req's mod A for LE negotiation and trunk support to transfer to EOB.  PT uses chuck pad to assist with scooting forward.  Pt demos poor sitting balance with L sided lean until feet are supported on floor.  Once feet are supported, pt demos improved sitting balance initially but as he fatigues returns to L sided lean requiring tactile and verbal assist to correct.  Pt. able to eat using R hand while maintaining position with supervision.  Pt also encouraged to do LE therex in sitting and demos good balance overall with alternating LE movement.  With B LE movement, pt demos inc posterior lean needing guarding to prevent LOB posteriorly.  Pt returns back to bed with mod A for B LE negotiation and trunk support.  Max A to scoot up in bed with use of chuck and min/max A to roll B in order to reposition chuck pad.  HR fluctuates between 120 - 140s with activity. ?Patient Response: Cooperative ? ?Transfers ?  ?  ?  ?  ?  ?  ?  ?  ?  ?  ?  ? ?Ambulation/Gait ?  ?  ?  ?  ?  ?  ?  ?  ? ? ?  Stairs ?  ?  ?  ?  ?  ? ? ?Wheelchair Mobility ?  ? ?Modified Rankin (Stroke Patients Only) ?  ? ? ?  ?Balance Overall balance assessment: Needs assistance ?Sitting-balance support: Feet supported ?Sitting balance-Leahy Scale: Fair ?  ?Postural control: Left lateral lean ?  ?  ?  ?  ?  ?  ?  ?  ?  ?  ?  ?  ?  ?  ?  ? ?  ?Cognition Arousal/Alertness: Awake/alert ?Behavior During Therapy: [facility] for tasks assessed/performed ?Overall Cognitive  Status: Within Functional Limits for tasks assessed ?  ?  ?  ?  ?  ?  ?  ?  ?  ?  ?  ?  ?  ?  ?  ?  ?General Comments: Pt is supine in bed when PT arrives.  States he's hungry but has been unable to eat because "no one will help him".  PT offers to assist pt if he is willing to work on his balance sitting EOB, pt is agreeable. ?  ?  ? ?  ?Exercises General Exercises - Lower Extremity ?Long Arc Quad: AROM, Both, 10 reps ?Hip Flexion/Marching: AROM, Both, 20 reps ? ?  ?General Comments   ?  ?  ? ?Pertinent Vitals/Pain Pain Assessment ?Pain Assessment: 0-10 ?Pain Score: 0-No pain  ? ? ?Home Living   ?  ?  ?  ?  ?  ?  ?  ?  ?  ?   ?  ?Prior Function    ?  ?  ?   ? ?PT Goals (current goals can now be found in the care plan section) Progress towards PT goals: Progressing toward goals ? ?  ?Frequency ? ? ?   ? ? ? ?  ?PT Plan Current plan remains appropriate  ? ? ?Co-evaluation PT/OT/SLP Co-Evaluation/Treatment: Yes ?  ?  ?  ?  ? ?  ?AM-PAC PT "6 Clicks" Mobility   ?Outcome Measure ? Help needed turning from your back to your side while in a flat bed without using bedrails?: A Lot ?Help needed moving from lying on your back to sitting on the side of a flat bed without using bedrails?: A Lot ?Help needed moving to and from a bed to a chair (including a wheelchair)?: A Lot ?Help needed standing up from a chair using your arms (e.g., wheelchair or bedside chair)?: A Lot ?Help needed to walk in hospital room?: A Lot ?Help needed climbing 3-5 steps with a railing? : Total ?6 Click Score: 11 ? ?  ?End of Session   ?Activity Tolerance: Patient tolerated treatment well ?Patient left: in bed;with call bell/phone within reach;with bed alarm set ?Nurse Communication: Mobility status ?  ?  ? ? ?Time: [id number] ?PT Time Calculation (min) (ACUTE ONLY): 26 min ? ?Charges:  $Therapeutic Exercise: 8-22 mins ?$Therapeutic Activity: 23-37 mins          ?          ?Karl Knarr A. Aleja Yearwood, PT, DPT ?Acute Rehabilitation Services ?Office:  [phone]  ? ? ?[place] ?12/25/2021, 11:03 AM ? ?

## 2021-12-25 NOTE — H&P (View-Only) (Signed)
? ? ?Subjective  -  ? ?Transferred to 5 W. from the ICU. ?No complaints this morning ? ? ? ?Physical Exam: ? ?Neuro: No focal deficits ?Pulmonary: Remains on oxygen but breathing is nonlabored ?Abdomen: Remains soft and ? ? ? ? ? ? ?Assessment/Plan:  ? ?Carotid stenosis: Patient has a known occlusion of his left internal carotid artery.  He has had progression of the stenosis on the right side and recently had left MCA distribution acute infarct.  This is likely secondary to his chronic occlusion and hypoperfusion.  Potentially could be atrial fibrillation.  Regardless, I think given the high-grade stenosis on the right, this needs to be addressed.  He recently got a CT angiogram of the neck with reduced contrast (creatinine was 1.5 today).  This shows a calcified lesion at his right carotid bifurcation.  I do believe that he is a candidate for TCAR.  I would like to get this done as soon as possible.  I have him scheduled for tomorrow, however I want to make sure that from a pulmonary and medical standpoint that he is stable enough to proceed with general anesthesia for right carotid TCAR.  I discussed the details of the procedure with the patient including the risk of bleeding and stroke.  He is in agreement to proceed if clinically indicated. ? ?The patient will need to be on aspirin and Plavix.  I will start aspirin today.  He would also need to be on a statin.  It sounds like he has an allergy to atorvastatin but I do not know if he has tried other medications.  I would favor starting him on Crestor 10 mg ? ? I also follow the patient for lower extremity vascular disease.  I recently saw him for right leg wounds and was planning a right lower extremity arteriogram.  This obviously will be on hold until his carotid issues are resolved. ? ?Reginald Ayers ?12/25/2021 ?9:53 AM ?-- ? ?Vitals:  ? 12/25/21 0742 12/25/21 0917  ?BP: 116/72   ?Pulse: (!) 126   ?Resp: (!) 37   ?Temp: (!) 97.5 ?F (36.4 ?C)   ?SpO2: 96% 97%   ? ? ?Intake/Output Summary (Last 24 hours) at 12/25/2021 0953 ?Last data filed at 12/24/2021 2300 ?Gross per 24 hour  ?Intake 1001.83 ml  ?Output 1150 ml  ?Net -148.17 ml  ? ? ? ?Laboratory ?CBC ?   ?Component Value Date/Time  ? WBC 8.8 12/25/2021 0202  ? HGB 9.8 (L) 12/25/2021 0202  ? HCT 30.7 (L) 12/25/2021 0202  ? PLT 207 12/25/2021 0202  ? ? ?BMET ?   ?Component Value Date/Time  ? NA 143 12/25/2021 0202  ? K 3.2 (L) 12/25/2021 0202  ? CL 107 12/25/2021 0202  ? CO2 22 12/25/2021 0202  ? GLUCOSE 224 (H) 12/25/2021 0202  ? BUN 31 (H) 12/25/2021 0202  ? CREATININE 1.52 (H) 12/25/2021 0202  ? CALCIUM 8.1 (L) 12/25/2021 0202  ? GFRNONAA 48 (L) 12/25/2021 0202  ? GFRAA >60 09/25/2017 2016  ? ? ?COAG ?Lab Results  ?Component Value Date  ? INR 1.5 10/26/2008  ? INR 1.1 10/24/2008  ? ?No results found for: PTT ? ?Antibiotics ?Anti-infectives (From admission, onward)  ? ? Start     Dose/Rate Route Frequency Ordered Stop  ? 12/21/21 1200  cefTRIAXone (ROCEPHIN) 2 g in sodium chloride 0.9 % 100 mL IVPB       ? 2 g ?200 mL/hr over 30 Minutes Intravenous Every 24 hours 12/20/21 1444   12/24/21 1430  ? 12/20/21 1500  cefTRIAXone (ROCEPHIN) 1 g in sodium chloride 0.9 % 100 mL IVPB       ?Note to Pharmacy: Was supposed to be given 2 g but ED is order set only gives 1 g  ? 1 g ?200 mL/hr over 30 Minutes Intravenous  Once 12/20/21 1443 12/20/21 2112  ? 12/20/21 1415  cefTRIAXone (ROCEPHIN) 1 g in sodium chloride 0.9 % 100 mL IVPB       ? 1 g ?200 mL/hr over 30 Minutes Intravenous  Once 12/20/21 1409 12/20/21 1556  ? 12/20/21 1415  azithromycin (ZITHROMAX) 500 mg in sodium chloride 0.9 % 250 mL IVPB  Status:  Discontinued       ? 500 mg ?250 mL/hr over 60 Minutes Intravenous Every 24 hours 12/20/21 1409 12/23/21 1214  ? ?  ? ? ? ?V. Leia Alf, M.D., FACS ?[facility] ?Office: [phone] ?Pager:  [phone]  ?

## 2021-12-25 NOTE — Plan of Care (Signed)

## 2021-12-25 NOTE — [place] FL2 (Signed)
?[place] MEDICAID FL2 LEVEL OF CARE SCREENING TOOL  ?  ? ?IDENTIFICATION  ?Patient Name: ?Reginald Ayers Birthdate: November 16, 1950 Sex: male Admission Date (Current Location): ?12/20/2021  ?[place] and [place] Number: ? [place] ?  Facility and Address:  ?[facility], [place] [address] [place], [place] [address] ?     Provider Number: ?[id number]  ?Attending Physician Name and Address:  ?Jonetta Osgood, MD ? Relative Name and Phone Number:  ?  ?   ?Current Level of Care: ?Hospital Recommended Level of Care: ?[place] Prior Approval Number: ?  ? ?Date Approved/Denied: ?  PASRR Number: ?[id number] ? ?Discharge Plan: ?SNF ?  ? ?Current Diagnoses: ?Patient Active Problem List  ? Diagnosis Date Noted  ? Cerebral embolism with cerebral infarction 12/22/2021  ? Pressure injury of skin 12/21/2021  ? Acute respiratory failure with hypoxia ([place]) 12/20/2021  ? Severe protein-energy malnutrition ([place]) 12/20/2021  ? Acidosis, metabolic 23/53/6144  ? AKI (acute kidney injury) ([place]) 09/27/2021  ? Stage 2 chronic kidney disease 08/14/2019  ? Community acquired pneumonia 08/12/2017  ? GERD (gastroesophageal reflux disease) 07/30/2015  ? Tobacco abuse 12/06/2014  ? COPD (chronic obstructive pulmonary disease) ([place])   ? Hx of CABG Feb 2010 08/21/2013  ? Peripheral arterial disease ([place]) 08/21/2013  ? Carotid artery disease ([place]) 08/21/2013  ? Diabetes ([place]) 08/21/2013  ? Essential hypertension 08/21/2013  ? Hyperlipidemia 08/21/2013  ? ? ?Orientation RESPIRATION BLADDER Height & Weight   ?  ?Self, Time, Situation, Place ? O2 (Nasal cannula 1L) Continent, External catheter Weight: 122 lb 5.7 oz (55.5 kg) ?Height:  '5\' 3"'  (160 cm)  ?BEHAVIORAL SYMPTOMS/MOOD NEUROLOGICAL BOWEL NUTRITION STATUS  ?    Incontinent Diet (See dc summary)  ?AMBULATORY STATUS COMMUNICATION OF NEEDS Skin   ?Extensive Assist Verbally PU Stage and Appropriate Care (Stage I on buttocks) ?  ?  ?  ?    ?     ?     ? ? ?Personal  Care Assistance Level of Assistance  ?Bathing, Feeding, Dressing Bathing Assistance: Maximum assistance ?Feeding assistance: Independent ?Dressing Assistance: Limited assistance ?   ? ?Functional Limitations Info  ?Speech, Sight Sight Info:  ([place]) ?  ?Speech Info: Impaired (difficult to understand)  ? ? ?SPECIAL CARE FACTORS FREQUENCY  ?PT (By licensed PT), OT (By licensed OT), Speech therapy   ?  ?PT Frequency: 5x/week ?OT Frequency: 5x/week ?  ?  ?Speech Therapy Frequency: 2x/week ?   ? ? ?Contractures Contractures Info: Not present  ? ? ?Additional Factors Info  ?Code Status, Allergies, Insulin Sliding Scale Code Status Info: Full ?Allergies Info: Actos (Pioglitazone), Atorvastatin, Lisinopril ?  ?Insulin Sliding Scale Info: See dc summary ?  ?   ? ?Current Medications (12/25/2021):  This is the current hospital active medication list ?Current Facility-Administered Medications  ?Medication Dose Route Frequency Provider Last Rate Last Admin  ? 0.9 %  sodium chloride infusion  250 mL Intravenous Continuous Agarwala, Einar Grad, MD 10 mL/hr at 12/24/21 2300 Infusion Verify at 12/24/21 2300  ? acetaminophen (TYLENOL) tablet 650 mg  650 mg Oral Q6H PRN Norval Morton, MD   650 mg at 12/24/21 1615  ? Or  ? acetaminophen (TYLENOL) suppository 650 mg  650 mg Rectal Q6H PRN Fuller Plan A, MD      ? albuterol (PROVENTIL) (2.5 MG/3ML) 0.083% nebulizer solution 2.5 mg  2.5 mg Nebulization Q2H PRN Norval Morton, MD      ? arformoterol (BROVANA) nebulizer solution  15 mcg  15 mcg Nebulization BID Kipp Brood, MD   15 mcg at 12/25/21 0915  ? aspirin EC tablet 81 mg  81 mg Oral Daily Serafina Mitchell, MD   81 mg at 12/25/21 1159  ? budesonide (PULMICORT) nebulizer solution 0.25 mg  0.25 mg Nebulization BID Kipp Brood, MD   0.25 mg at 12/25/21 0915  ? chlorhexidine gluconate (MEDLINE KIT) (PERIDEX) 0.12 % solution 15 mL  15 mL Mouth Rinse BID Kipp Brood, MD   15 mL at 12/24/21 1930  ? Chlorhexidine Gluconate Cloth 2 %  PADS 6 each  6 each Topical Daily Kipp Brood, MD   6 each at 12/25/21 0000  ? clopidogrel (PLAVIX) tablet 75 mg  75 mg Oral Daily Henri Medal, RPH   75 mg at 12/25/21 0751  ? docusate sodium (COLACE) capsule 100 mg  100 mg Oral BID Henri Medal, RPH   100 mg at 12/25/21 2897  ? feeding supplement (VITAL AF 1.2 CAL) liquid 1,000 mL  1,000 mL Per Tube Continuous Rigoberto Noel, MD 50 mL/hr at 12/23/21 0236 1,000 mL at 12/23/21 0236  ? heparin ADULT infusion 100 units/mL (25000 units/278m)  1,250 Units/hr Intravenous Continuous MMaryjane Hurter MD 12.5 mL/hr at 12/25/21 0308 1,250 Units/hr at 12/25/21 0308  ? insulin aspart (novoLOG) injection 0-20 Units  0-20 Units Subcutaneous TID WC PAndres LabrumD, PA-C   7 Units at 12/25/21 1644  ? insulin aspart (novoLOG) injection 0-5 Units  0-5 Units Subcutaneous QHS DLaqueta Jean MD   5 Units at 12/24/21 2332  ? insulin aspart (novoLOG) injection 3 Units  3 Units Subcutaneous TID WC PAndres LabrumD, PA-C   3 Units at 12/24/21 1615  ? MEDLINE mouth rinse  15 mL Mouth Rinse QID MMaryjane Hurter MD   15 mL at 12/25/21 1646  ? metoprolol tartrate (LOPRESSOR) tablet 25 mg  25 mg Oral BID CLelon Perla MD      ? multivitamin with minerals tablet 1 tablet  1 tablet Oral Daily MMaryjane Hurter MD   1 tablet at 12/25/21 0751  ? ondansetron (ZOFRAN) tablet 4 mg  4 mg Oral Q6H PRN SFuller PlanA, MD      ? Or  ? ondansetron (ZOFRAN) injection 4 mg  4 mg Intravenous Q6H PRN SFuller PlanA, MD   4 mg at 12/22/21 1046  ? polyethylene glycol (MIRALAX / GLYCOLAX) packet 17 g  17 g Oral Daily BHenri Medal RPH      ? revefenacin (YUPELRI) nebulizer solution 175 mcg  175 mcg Nebulization Daily AKipp Brood MD   175 mcg at 12/24/21 0826  ? rosuvastatin (CRESTOR) tablet 20 mg  20 mg Oral Daily CLelon Perla MD   20 mg at 12/25/21 1644  ? sodium chloride flush (NS) 0.9 % injection 3 mL  3 mL Intravenous Q12H Smith, Rondell A, MD   3 mL at 12/24/21 2254   ? ? ? ?Discharge Medications: ?Please see discharge summary for a list of discharge medications. ? ?Relevant Imaging Results: ? ?Relevant Lab Results: ? ? ?Additional Information ?SSN: [id number] Pfizer COVID-19 Vaccine 12/05/2019 , 11/10/2019 ? ?NLissa MoralesRayyan, LCSW ? ? ? ? ?

## 2021-12-25 NOTE — TOC Initial Note (Addendum)
Transition of Care [facility]) - Initial/Assessment Note    Patient Details  Name: Reginald Ayers MRN: [id number] Date of Birth: 1951-04-22  Transition of Care [facility]) CM/SW Contact:    Mearl Latin, LCSW Phone Number: 12/25/2021, 4:36 PM  Clinical Narrative:                 CSW received consult for possible SNF placement at time of discharge. CSW spoke with patient who is somewhat difficult to understand but stated that he lives with roommates. Patient expressed understanding of PT recommendation and is agreeable to SNF placement at time of discharge. CSW discussed insurance authorization process and will provide Medicare SNF ratings list. Patient has received COVID vaccines. CSW will send out referrals for review. Patient requested water. Unit secretary assisted patient and explained that he must drink fluids out of the blue cup in his room only.   Skilled Nursing Rehab Facilities-   [email or web address]   Ratings out of 5 possible   Name Address  Phone # Quality Care Staffing Health Inspection Overall  [facility] [address] [place] [phone] 5 2 3   [facility]  [address] [place], [place] [phone] 3 2 5 5   [facility] [address] [place], [place] [phone] 3 1 1 1   [facility] [address] [phone] 3 2 4 4   [facility] [address], [place] [phone] 1 1 2 1   [facility] [phone] [address] [place] [phone] 2 1 4 3   [facility] [address], [place] [phone] 5 2 3 4   [facility] [address], [place] [phone] 5 2 2 3   [address] ([place]) [address], [place] [phone] 5 1 2 2   [facility] [phone] [address], [place] Otto [phone] 4 1 2 1   [facility] [address] [facility] [phone] 4 1 2 1   [facility] [address], [place] [phone] 4 1 1 1   Eligha Bridegroom  [address] [place] [phone] 3 2 4 4           [facility] [address] [place] [phone]      [facility] [address] [place] [phone] 4 2 3 3   [facility] [place] [address], [place] [phone] 4 1 5 4   [address] [place] [address], [place] [phone] 2 1 1 1   [facility] [address], [employer] [phone] 2 1 3 2           [address] (no [facility]) [address], [place] [phone] 4 5 5 5   [facility] (No Hum[employer]) [address] [place], [place] [phone] 3 1 4 3   [facility] (No [facility]) [address] [place], [place] [phone] 5 5 5 5   [facility] [address], [employer] [phone] 3 2 4 4   [facility] [address] [place] [phone] 1 1 2 1   [facility] [address], [place] [phone] 2 1 1 1   [facility] [address] [place] [phone] 5 2 4 5   [facility] [address], [place] [phone] 3 1 1 1   [facility] [address] [place], [place] [phone] 2 1 2 1           [facility] [address] [place] [phone] 1 1 1 1   Renelda Mom [address], [place] Clines  [phone] 2 4 2 2   [facility] [address], [place]an [phone] 5  2 3 4   [facility] [place] [address] [place] [phone] 2 1 1 1   [facility] (No Humana) [address] [place] [phone] 2 1 3 2   [facility] [address] [place] [phone] 3 1 1 1           [facility] [address] [place], [place] [phone] 5 4 5 5   [facility])  [address], [place] [phone] 2 2 3 3   [facility] [address], [place] [phone] 3 2 4 4   [facility] [address] [place] [phone] 4 3 4 4   [address] [place], [place] [phone] 3 3 1 1   [facility]) [address] [place] [phone] 2 2 4 4      Expected Discharge Plan: Skilled Nursing Facility Barriers to Discharge: Continued Medical Work up, SNF Pending bed offer   Patient Goals and CMS Choice Patient states their goals for this hospitalization and ongoing recovery are:: Rehab CMS Medicare.gov Compare Post Acute Care list provided to:: Patient Choice offered to / list presented to : Patient  Expected Discharge Plan and Services Expected Discharge Plan: Skilled Nursing Facility In-house Referral: Clinical Social Work   Post Acute Care Choice: Skilled Nursing Facility Living arrangements for the past 2 months: Single Family Home                                      Prior Living Arrangements/Services Living arrangements for the past 2 months: Single Family Home Lives with:: Roommate, Pets Patient language and need for interpreter reviewed:: Yes Do you feel safe going back to the place where you live?: Yes      Need for Family Participation in Patient Care: Yes (Comment) Care giver support system in place?: Yes (comment)   Criminal Activity/Legal Involvement Pertinent to Current Situation/Hospitalization: No - Comment as needed  Activities of Daily Living Home Assistive Devices/Equipment: None ADL Screening (condition at time of admission) Patient's cognitive ability adequate to safely complete daily activities?: No Is the patient deaf or have difficulty hearing?: No Does the patient have difficulty seeing, even when wearing glasses/contacts?: No Does the patient have difficulty concentrating, remembering, or making decisions?: Yes Patient able to express need for assistance with ADLs?: No Does the patient have difficulty dressing or bathing?: Yes Independently performs ADLs?: No Communication: Dependent Is this a change from baseline?: Change from baseline, expected to last >3 days Dressing (OT): Dependent Is this a change from baseline?: Change from baseline, expected  to last >3 days Grooming: Dependent Is this a change from baseline?: Change from baseline, expected to last >3 days Feeding: Dependent Is this a change from baseline?: Change from baseline, expected to last >3 days Bathing: Dependent Is this a change from baseline?: Change from baseline, expected to last >3 days Toileting: Dependent Is this a change from baseline?: Change from baseline, expected to last >3days In/Out Bed: Dependent Is this a change from baseline?: Change from baseline, expected to last >3 days Walks in Home: Dependent Is this a change from baseline?: Change from baseline, expected to last >3 days Does the patient have difficulty walking or climbing stairs?: Yes Weakness of Legs: Both Weakness of Arms/Hands: Both  Permission Sought/Granted Permission sought to share information with : Facility Medical sales representative, Family Supports Permission granted to share information with : Yes, Verbal  Permission Granted  Share Information with NAME: [name]  Permission granted to share info w AGENCY: SNFs  Permission granted to share info w Relationship: Niece  Permission granted to share info w Contact Information: [phone]  Emotional Assessment Appearance:: Appears stated age Attitude/Demeanor/Rapport: Gracious Affect (typically observed): Accepting, Appropriate Orientation: : Oriented to Self, Oriented to Place, Oriented to  Time, Oriented to Situation (Somewhat difficult to understand speech) Alcohol / Substance Use: Not Applicable Psych Involvement: No (comment)  Admission diagnosis:  Pneumonia [J18.9] Acute respiratory failure with hypoxia (HCC) [J96.01] AKI (acute kidney injury) (HCC) [N17.9] Sepsis due to pneumonia (HCC) [J18.9, A41.9] Community acquired pneumonia, unspecified laterality [J18.9] Patient Active Problem List   Diagnosis Date Noted   Cerebral embolism with cerebral infarction 12/22/2021   Pressure injury of skin 12/21/2021   Acute respiratory  failure with hypoxia (HCC) 12/20/2021   Severe protein-energy malnutrition (HCC) 12/20/2021   Acidosis, metabolic 12/20/2021   AKI (acute kidney injury) (HCC) 09/27/2021   Stage 2 chronic kidney disease 08/14/2019   Community acquired pneumonia 08/12/2017   GERD (gastroesophageal reflux disease) 07/30/2015   Tobacco abuse 12/06/2014   COPD (chronic obstructive pulmonary disease) (HCC)    Hx of CABG Feb 2010 08/21/2013   Peripheral arterial disease (HCC) 08/21/2013   Carotid artery disease (HCC) 08/21/2013   Diabetes (HCC) 08/21/2013   Essential hypertension 08/21/2013   Hyperlipidemia 08/21/2013   PCP:  Renford Dills, MD Pharmacy:   [facility] [phone] - [place], [place] - [address] AT [facility] - [address] [place] [address] Phone: [phone] Fax: [phone]     Social Determinants of Health (SDOH) Interventions    Readmission Risk Interventions     View : No data to display.

## 2021-12-25 NOTE — Progress Notes (Addendum)
?      ?                 PROGRESS NOTE ? ?      ?PATIENT DETAILS ?Name: Reginald Ayers ?Age: 71 y.o. ?Sex: male ?Date of Birth: [birth year] ?Admit Date: 12/20/2021 ?Admitting Physician Kipp Brood, MD ?DJM:EQASTM, Jori Moll, MD ? ?Brief Summary: ?Patient is a 71 y.o.  male with history of PAD, CAD s/p CABG, HTN, HLD, COPD-who presented with weakness/shortness of breath-he was found to have acute hypoxic respiratory failure due to multifocal pneumonia-was intubated in the emergency room and subsequently admitted to the ICU by PCCM.  Further hospital course was complicated by acute CVA, A-fib RVR.  Upon stability-[facility]. ? ? ?Significant events: ?4/29>>Intubated and admitted to the ICU. >> A fib and elevated troponins ?4/30>> MRI with acute CVA. ?5/02>> extubated  ?5/04>> [facility] ? ?Significant studies: ?4/29>> CT head: No acute intracranial abnormality. ?4/29>> CXR: Right lower lobe airspace consolidation. ?4/29>> CT chest/abdomen/pelvis: Multifocal PNA ?4/30>> A1c: 8.1 ?4/30>> LDL: 21 LDL of 20 ?4/30>> MRI brain: Acute ischemia left temporal lobe, left ICA occlusion. ?5/01>> Echo: EF 50-55%, dyskinesis of left ventricular apical segment. ?5/04>> CTA neck:> 80% stenosis of right ICA, left ICA occluded. ?5/04>> CTA head: Left ICA occluded at the precavernous/cavernous segments-reconstitution within the paraclinoid and supraclinoid ICA.  No other large LVO. ? ?Significant microbiology data: ?4/29>> COVID/influenza PCR: Negative ?4/29>> blood culture: No growth ?4/30>> trach aspirate: Normal respiratory flora ? ?Procedures: ?ETT: 4/29>> 5/2 ? ?Consults: ?PCCM, neurology, vascular surgery ? ?Subjective: ?Lying comfortably in bed-denies any chest pain or shortness of breath. ? ?Objective: ?Vitals: ?Blood pressure 116/72, pulse (!) 126, temperature (!) 97.5 ?F (36.4 ?C), temperature source Oral, resp. rate (!) 37, height '5\' 3"'  (1.6 m), weight 55.5 kg, SpO2 97 %.  ? ?Exam: ?Gen Exam:Alert awake-not  in any distress ?HEENT:atraumatic, normocephalic ?Chest: B/L clear to auscultation anteriorly ?CVS:S1S2 regular ?Abdomen:soft non tender, non distended ?Extremities:no edema ?Neurology: Non focal ?Skin: no rash ? ?Pertinent Labs/Radiology: ? ?  Latest Ref Rng & Units 12/25/2021  ?  2:02 AM 12/24/2021  ?  2:20 AM 12/23/2021  ?  4:59 AM  ?CBC  ?WBC 4.0 - 10.5 K/uL 8.8   8.7   11.2    ?Hemoglobin 13.0 - 17.0 g/dL 9.8   8.4   9.0    ?Hematocrit 39.0 - 52.0 % 30.7   26.3   26.8    ?Platelets 150 - 400 K/uL 207   207   211    ?  ?Lab Results  ?Component Value Date  ? NA 143 12/25/2021  ? K 3.2 (L) 12/25/2021  ? CL 107 12/25/2021  ? CO2 22 12/25/2021  ?  ? ?Assessment/Plan: ?Severe sepsis due to PNA ?Acute hypoxic respiratory failure ?Overall improved-on just 2 L of oxygen.  Completed a course of antibiotics-all cultures negative to date. ? ?PAF with RVR ?Maintaining sinus rhythm-A-fib likely triggered by PNA/hypoxemia.  Echo stable.  Recent TSH February stable.  On IV heparin ? ?Acute CVA ?Unclear whether this is a embolic etiology from A-fib or from hypoperfusion in the setting of left ICA occlusion.  Work-up as above-on IV heparin/Plavix-neurology following. ? ?Left ICA occlusion ?> 80% stenosis of right ICA ?Vascular surgery following-TCAR tentatively scheduled for tomorrow.  We will get cardiology for preoperative risk assessment. ? ?Acute metabolic encephalopathy: ?2/2 sepsis/hypoxia-improved-relatively awake and alert ? ?AKI: ? Likely hemodynamically mediated-slowly improving-continue to follow. ? ?Elevated troponins: ?Likely demand  ischemia-echo with stable EF-but does have hypokinesis.  Cards consulted given that patient will have carotid artery revascularization tomorrow. ? ?History of CAD s/p CABG 2010 ?On antiplatelet/beta-blocker-not on statin due to myalgias. ? ?History of PAD s/p left common iliac/right SFA stenting ?Continue antiplatelets ? ?Hypokalemia:  ?Replete and recheck ? ?DM-2:  ?Continue SSI ? ?Recent  Labs  ?  12/24/21 ?5035 12/25/21 ?4656 12/25/21 ?1144  ?GLUCAP 357* 221* 278*  ?  ? ?COPD ?Continue bronchodilators-not in exacerbation ? ?Nutrition Status: ?Nutrition Problem: Severe Malnutrition ?Etiology: chronic illness (COPD, CAD) ?Signs/Symptoms: severe muscle depletion, severe fat depletion, percent weight loss (13% weight loss within 3 months) ?Percent weight loss: 13 % ?Interventions: Hormel Shake, MVI ? ?Pressure Ulcer: ?Pressure Injury 12/20/21 Buttocks Right;Left Stage 1 -  Intact skin with non-blanchable redness of a localized area usually over a bony prominence. wound is 0.5 cm lenght  by 0.6 cm width (Active)  ?12/20/21 1831  ?Location: Buttocks  ?Location Orientation: Right;Left  ?Staging: Stage 1 -  Intact skin with non-blanchable redness of a localized area usually over a bony prominence.  ?Wound Description (Comments): wound is 0.5 cm lenght  by 0.6 cm width  ?Present on Admission: Yes  ?Dressing Type Foam - Lift dressing to assess site every shift 12/25/21 0013  ? ? ?BMI: ?Estimated body mass index is 21.67 kg/m? as calculated from the following: ?  Height as of this encounter: '5\' 3"'  (1.6 m). ?  Weight as of this encounter: 55.5 kg.  ? ?Code status: ?  Code Status: Full Code  ? ?DVT Prophylaxis: ? IV heparin ? ?Family Communication: None at bedside ? ? ?Disposition Plan: ?Status is: Inpatient ?Remains inpatient appropriate because: Resolving sepsis/hypoxemia-CVA-new onset A-fib-carotid revascularization planned by vascular surgery tomorrow. ?  ?Planned Discharge Destination:Skilled nursing facility ? ? ?Diet: ?Diet Order   ? ?       ?  DIET - DYS 1 Room service appropriate? Yes; Fluid consistency: Nectar Thick  Diet effective now       ?  ? ?  ?  ? ?  ?  ? ? ?Antimicrobial agents: ?Anti-infectives (From admission, onward)  ? ? Start     Dose/Rate Route Frequency Ordered Stop  ? 12/21/21 1200  cefTRIAXone (ROCEPHIN) 2 g in sodium chloride 0.9 % 100 mL IVPB       ? 2 g ?200 mL/hr over 30 Minutes  Intravenous Every 24 hours 12/20/21 1444 12/24/21 1430  ? 12/20/21 1500  cefTRIAXone (ROCEPHIN) 1 g in sodium chloride 0.9 % 100 mL IVPB       ?Note to Pharmacy: Was supposed to be given 2 g but ED is order set only gives 1 g  ? 1 g ?200 mL/hr over 30 Minutes Intravenous  Once 12/20/21 1443 12/20/21 2112  ? 12/20/21 1415  cefTRIAXone (ROCEPHIN) 1 g in sodium chloride 0.9 % 100 mL IVPB       ? 1 g ?200 mL/hr over 30 Minutes Intravenous  Once 12/20/21 1409 12/20/21 1556  ? 12/20/21 1415  azithromycin (ZITHROMAX) 500 mg in sodium chloride 0.9 % 250 mL IVPB  Status:  Discontinued       ? 500 mg ?250 mL/hr over 60 Minutes Intravenous Every 24 hours 12/20/21 1409 12/23/21 1214  ? ?  ? ? ? ?MEDICATIONS: ?Scheduled Meds: ? arformoterol  15 mcg Nebulization BID  ? aspirin EC  81 mg Oral Daily  ? budesonide (PULMICORT) nebulizer solution  0.25 mg Nebulization BID  ? chlorhexidine gluconate (MEDLINE  KIT)  15 mL Mouth Rinse BID  ? Chlorhexidine Gluconate Cloth  6 each Topical Daily  ? clopidogrel  75 mg Oral Daily  ? docusate sodium  100 mg Oral BID  ? insulin aspart  0-20 Units Subcutaneous TID WC  ? insulin aspart  0-5 Units Subcutaneous QHS  ? insulin aspart  3 Units Subcutaneous TID WC  ? mouth rinse  15 mL Mouth Rinse QID  ? metoprolol tartrate  12.5 mg Oral Q6H  ? multivitamin with minerals  1 tablet Oral Daily  ? polyethylene glycol  17 g Oral Daily  ? revefenacin  175 mcg Nebulization Daily  ? sodium chloride flush  3 mL Intravenous Q12H  ? ?Continuous Infusions: ? sodium chloride 10 mL/hr at 12/24/21 2300  ? feeding supplement (VITAL AF 1.2 CAL) 1,000 mL (12/23/21 0236)  ? heparin 1,250 Units/hr (12/25/21 0308)  ? ?PRN Meds:.acetaminophen **OR** acetaminophen, albuterol, ondansetron **OR** ondansetron (ZOFRAN) IV ? ? ?I have personally reviewed following labs and imaging studies ? ?LABORATORY DATA: ?CBC: ?Recent Labs  ?Lab 12/20/21 ?1116 12/20/21 ?1127 12/21/21 ?5697 12/21/21 ?1202 12/22/21 ?0130 12/23/21 ?9480  12/24/21 ?1655 12/25/21 ?0202  ?WBC 18.8*   < > 14.2*  --  14.0* 11.2* 8.7 8.8  ?NEUTROABS 16.6*  --   --   --   --  9.9*  --   --   ?HGB 10.4*   < > 9.9* 8.8* 9.7* 9.0* 8.4* 9.8*  ?HCT 33.5*   < > 32.1* 26.0* 29

## 2021-12-25 NOTE — Progress Notes (Signed)
SLP Cancellation Note ? ?Patient Details ?Name: Reginald Ayers ?MRN: [id number] ?DOB: Jan 01, 1951 ? ? ?Cancelled treatment:       Reason Eval/Treat Not Completed: Other (comment) (pt getting ready to go to CTA, removed tray from table as precautions, will continue efforts) ? ? ?[name] ?12/25/2021, 7:45 AM ?Rolena Infante, MS [facility] SLP ?Acute Rehab Services ?Office [phone] ?Pager [phone] ? ?

## 2021-12-25 NOTE — Care Management Important Message (Signed)
Important Message ? ?Patient Details  ?Name: Reginald Ayers ?MRN: [id number] ?Date of Birth: Dec 26, 1950 ? ? ?Medicare Important Message Given:  Yes ? ? ? ? ?Stana Bayon ?12/25/2021, 3:53 PM ?

## 2021-12-25 NOTE — Progress Notes (Signed)
STROKE TEAM PROGRESS NOTE  ? ?INTERVAL HISTORY ?Pt lying in bed, no family at bedside.  Patient awake alert, no acute event overnight, no complaints.  Vascular surgery Dr. Trula Slade on board, CT head and neck showed right ICA more than 80% stenosis.  Plan for right TCAR tomorrow. ? ?Vitals:  ? 12/25/21 0742 12/25/21 0917 12/25/21 1146 12/25/21 1550  ?BP: 116/72  112/61 (!) 155/65  ?Pulse: (!) 126  (!) 114 (!) 116  ?Resp: (!) 37  (!) 28 (!) 23  ?Temp: (!) 97.5 ?F (36.4 ?C)  99.7 ?F (37.6 ?C) 99.1 ?F (37.3 ?C)  ?TempSrc: Oral  Oral Oral  ?SpO2: 96% 97% 93% 94%  ?Weight:      ?Height:      ? ?CBC:  ?Recent Labs  ?Lab 12/20/21 ?1116 12/20/21 ?1127 12/23/21 ?0459 12/24/21 ?0220 12/25/21 ?0202  ?WBC 18.8*   < > 11.2* 8.7 8.8  ?NEUTROABS 16.6*  --  9.9*  --   --   ?HGB 10.4*   < > 9.0* 8.4* 9.8*  ?HCT 33.5*   < > 26.8* 26.3* 30.7*  ?MCV 95.7   < > 90.8 95.3 93.6  ?PLT 284   < > 211 207 207  ? < > = values in this interval not displayed.  ? ?Basic Metabolic Panel:  ?Recent Labs  ?Lab 12/24/21 ?0220 12/24/21 ?1644 12/25/21 ?0202  ?NA 153* 143 143  ?K 4.0 3.7 3.2*  ?CL 118* 107 107  ?CO2 23 22 22   ?GLUCOSE 169* 381* 224*  ?BUN 40* 34* 31*  ?CREATININE 1.60* 1.53* 1.52*  ?CALCIUM 7.9* 7.9* 8.1*  ?MG 1.8  --  2.1  ?PHOS 3.1  --  2.4*  ? ?Lipid Panel:  ?Recent Labs  ?Lab 12/21/21 ?1240 12/24/21 ?0220  ?CHOL 85  --   ?TRIG 160* 117  ?HDL 32*  --   ?CHOLHDL 2.7  --   ?VLDL 32  --   ?[place] 21  --   ? ?HgbA1c:  ?Recent Labs  ?Lab 12/21/21 ?2246  ?HGBA1C 8.1*  ? ?Urine Drug Screen: No results for input(s): LABOPIA, COCAINSCRNUR, LABBENZ, AMPHETMU, THCU, LABBARB in the last 168 hours.  ?Alcohol Level No results for input(s): ETH in the last 168 hours. ? ?IMAGING past 24 hours ?CT ANGIO HEAD NECK W WO CM ? ?Result Date: 12/25/2021 ?CLINICAL DATA:  Provided history: Stroke, follow-up. EXAM: CT ANGIOGRAPHY HEAD AND NECK TECHNIQUE: Multidetector CT imaging of the head and neck was performed using the standard protocol during bolus  administration of intravenous contrast. Multiplanar CT image reconstructions and MIPs were obtained to evaluate the vascular anatomy. Carotid stenosis measurements (when applicable) are obtained utilizing NASCET criteria, using the distal internal carotid diameter as the denominator. RADIATION DOSE REDUCTION: This exam was performed according to the departmental dose-optimization program which includes automated exposure control, adjustment of the mA and/or kV according to patient size and/or use of iterative reconstruction technique. CONTRAST:  17mL OMNIPAQUE IOHEXOL 350 MG/ML SOLN COMPARISON:  Brain MRI 12/21/2021. Head CT 12/20/2021. Chest radiograph 12/22/2021. FINDINGS: CT HEAD FINDINGS Brain: Mild generalized parenchymal atrophy. A known tiny acute/early subacute infarct within the posterior left temporal lobe white matter was better appreciated on the prior brain MRI of 12/21/2021. Redemonstrated small chronic cortical infarcts within the left frontal lobe. Mild patchy and ill-defined hypoattenuation within the cerebral white matter and pons, nonspecific but compatible chronic small vessel ischemic disease. Small chronic infarct within the right cerebellar hemisphere. There is no acute intracranial hemorrhage. No extra-axial fluid collection. No  evidence of an intracranial mass. No midline shift. Vascular: No hyperdense vessel.  Atherosclerotic calcifications. Skull: No acute fracture or aggressive osseous lesion. Nonspecific symmetric thinning of the parietal calvarium. Sinuses: No orbital mass or acute orbital finding. Postsurgical appearance of the paranasal sinuses. Frothy secretions, and mild mucosal thickening, within the right frontal sinus. Mild mucosal thickening within the left frontal sinus. Mild mucosal thickening within the bilateral ethmoidectomy cavities. Small volume frothy secretions within the right sphenoid sinus. Small volume frothy secretions, and mild mucosal thickening, within the left  sphenoid sinus. Mild mucosal thickening within the bilateral maxillary sinuses. Orbits: Right middle ear/mastoid effusion.  Left mastoid effusion. Review of the MIP images confirms the above findings CTA NECK FINDINGS Aortic arch: Standard aortic branching. Atherosclerotic plaque within the visualized aortic arch and proximal major branch vessels of the neck. No hemodynamically significant innominate or proximal subclavian artery stenosis. Right carotid system: CCA and ICA patent within the neck. Mild soft plaque within the proximal to mid CCA. Prominent calcified plaque about the carotid bifurcation and within the proximal ICA. Resultant severe stenosis of the proximal ICA (likely greater than 80%). Left carotid system: The CCA is patent. Scattered soft and calcified plaque throughout the CCA resulting in less than 50% stenosis. Prominent atherosclerotic plaque about the carotid bifurcation and within the proximal ICA. The cervical ICA is occluded from its origin. Vertebral arteries: The vertebral arteries are patent within the neck. Mild nonstenotic atherosclerotic plaque at the bilateral vertebral artery origins and within the proximal left V1 segment. Skeleton: No acute fracture or aggressive osseous lesion. Cervical and upper thoracic spondylosis. Bilateral facet joint ankylosis at C3-C4. Congenital incomplete segmentation of the T2-T3 vertebrae. Partially imaged thoracic dextrocurvature. Other neck: No neck mass or cervical lymphadenopathy. Upper chest: Prior median sternotomy. Emphysema. Partially imaged bilateral pleural effusions. Incompletely imaged airspace disease within the superior aspect of the right lower lobe and within the lingula. Review of the MIP images confirms the above findings CTA HEAD FINDINGS Anterior circulation: The left internal carotid artery remains occluded at the precavernous and cavernous segments. There is reconstitution of enhancement within the paraclinoid and supraclinoid left  ICA. Calcified plaque results in up to moderate stenosis of the paraclinoid left ICA. The intracranial right ICA is patent. Calcified plaque within this vessel with no more than mild stenosis The M1 middle cerebral arteries are patent. No M2 proximal branch occlusion or high-grade proximal stenosis is identified. The anterior cerebral arteries are patent. No intracranial aneurysm is identified. Posterior circulation: The intracranial vertebral arteries are patent. Nonstenotic calcified plaque within the V4 left vertebral artery. The basilar artery is patent. The posterior cerebral arteries are patent. Posterior communicating arteries are diminutive or absent, bilaterally. Venous sinuses: Within the limitations of contrast timing, no convincing thrombus. Anatomic variants: As described. Review of the MIP images confirms the above findings IMPRESSION: CT head: 1. Known tiny acute/early subacute infarct within the left temporal white matter, better appreciated on the brain MRI of 12/21/2021. 2. Background parenchymal atrophy, chronic small vessel ischemic disease and chronic infarcts as described and stable. 3. Paranasal sinus disease. 4. Right middle ear/mastoid effusion. A left mastoid effusion is also present. CTA neck: 1. The left internal carotid artery is occluded throughout the neck. 2. Prominent atherosclerotic plaque about the right carotid bifurcation and within the proximal right ICA with resultant severe stenosis at the origin of the right ICA (likely greater than 80%). 3. Vertebral arteries patent within the neck without stenosis. Mild non-stenotic atherosclerotic plaque within both  vessels, as described. 4.  Aortic Atherosclerosis (ICD10-I70.0). 5. Partially imaged airspace disease within the superior aspect of the right lower lobe and within the lingula. 6. Partially imaged bilateral pleural effusions. CTA head: 1. The left internal carotid artery remains occluded at the precavernous and cavernous  segments. There is reconstitution of enhancement within the paraclinoid and supraclinoid left ICA. Calcified plaque results in up to moderate stenosis of the paraclinoid left ICA. 2. Elsewhere, no intracranial large

## 2021-12-25 NOTE — Progress Notes (Signed)
Occupational Therapy Treatment ?Patient Details ?Name: Reginald Ayers ?MRN: [id number] ?DOB: 1951/08/16 ?Today's Date: 12/25/2021 ? ? ?History of present illness The pt is a 71 yo male presenting 4/29 with increasing shortness of breath and required intubation and mechanical ventilation for respiratory failure. MRI shows left ICA with poor flow and evidence of reconstitued flow at L ICA terminus, MRA pending.  History of bilateral carotid artery stenosis (total occlusion of the left, moderate stenosis of the right), HTN, HLD, prior tobacco abuse, diabetes, coronary artery disease s/p CABG, peripheral vascular disease s/p stents.  Pt extubated on 5/2.  CTA (+) for L ICA occlusion, R ICA sever stenosis.  Planning for R carotid TCAR tomorrow. ?  ?OT comments ? Pt agreeable to sitting EOB to drink and self feed ice chips. Also participated in grooming and shoulder AAROM. Demonstrating fair sitting balance with feet on floor. Pt stated he wanted to get in the chair, but not today. Highly focused on wanting to drink water. HR to 120s. Pt aware he has procedure tomorrow.   ? ?Recommendations for follow up therapy are one component of a multi-disciplinary discharge planning process, led by the attending physician.  Recommendations may be updated based on patient status, additional functional criteria and insurance authorization. ?   ?Follow Up Recommendations ? Skilled nursing-short term rehab (<3 hours/day)  ?  ?Assistance Recommended at Discharge Frequent or constant Supervision/Assistance  ?Patient can return home with the following ? Two people to help with walking and/or transfers;Two people to help with bathing/dressing/bathroom;Assistance with cooking/housework;Assistance with feeding;Direct supervision/assist for medications management;Direct supervision/assist for financial management;Assist for transportation;Help with stairs or ramp for entrance ?  ?Equipment Recommendations ? Other (comment) (defer to next venue)  ?   ?Recommendations for Other Services   ? ?  ?Precautions / Restrictions Precautions ?Precautions: Fall ?Precaution Comments: monitor HR ?Restrictions ?Weight Bearing Restrictions: No  ? ? ?  ? ?Mobility Bed Mobility ?Overal bed mobility: Needs Assistance ?Bed Mobility: Supine to Sit, Sit to Supine ?  ?  ?Supine to sit: Mod assist ?Sit to supine: Mod assist ?  ?General bed mobility comments: assist for LEs over EOB, to raise trunk and use of bed pad for hips to EOB, assist for LEs back into bed ?  ? ?Transfers ?  ?  ?  ?  ?  ?  ?  ?  ?  ?General transfer comment: pt declined attempt to stand ?  ?  ?Balance Overall balance assessment: Needs assistance ?Sitting-balance support: Feet supported ?Sitting balance-Leahy Scale: Fair ?  ?  ?  ?  ?  ?  ?  ?  ?  ?  ?  ?  ?  ?  ?  ?  ?   ? ?ADL either performed or assessed with clinical judgement  ? ?ADL Overall ADL's : Needs assistance/impaired ?Eating/Feeding: Minimal assistance;Sitting ?Eating/Feeding Details (indicate cue type and reason): ice chips with spoon and water in blue cup ?Grooming: Wash/dry face;Sitting;Minimal assistance ?Grooming Details (indicate cue type and reason): assist for thoroughness ?  ?  ?  ?  ?  ?  ?  ?  ?  ?  ?  ?  ?  ?  ?  ?  ?  ? ?Extremity/Trunk Assessment   ?  ?  ?  ?  ?  ? ?Vision   ?  ?  ?Perception   ?  ?Praxis   ?  ? ?Cognition Arousal/Alertness: Awake/alert ?Behavior During Therapy: Flat affect ?Overall Cognitive Status: Impaired/Different from  baseline ?  ?  ?  ?  ?  ?  ?  ?  ?  ?  ?  ?  ?  ?  ?  ?  ?General Comments: pt highly focused on wanting water while drinking water from his blue cup and spoon feeding himself ice chips ?  ?  ?   ?Exercises Exercises: General Upper Extremity ?General Exercises - Upper Extremity ?Shoulder Flexion: AAROM, Both, 15 reps, Supine ? ?  ?Shoulder Instructions   ? ? ?  ?General Comments    ? ? ?Pertinent Vitals/ Pain       Pain Assessment ?Pain Assessment: No/denies pain ? ?Home Living   ?  ?  ?  ?  ?  ?  ?   ?  ?  ?  ?  ?  ?  ?  ?  ?  ?  ?  ? ?  ?Prior Functioning/Environment    ?  ?  ?  ?   ? ?Frequency ? Min 2X/week  ? ? ? ? ?  ?Progress Toward Goals ? ?OT Goals(current goals can now be found in the care plan section) ? Progress towards OT goals: Progressing toward goals ? ?Acute Rehab OT Goals ?OT Goal Formulation: With family ?Time For Goal Achievement: 01/06/22 ?Potential to Achieve Goals: Fair  ?Plan Discharge plan remains appropriate   ? ?Co-evaluation ? ? ?   ?  ?  ?  ?  ? ?  ?AM-PAC OT "6 Clicks" Daily Activity     ?Outcome Measure ? ? Help from another person eating meals?: A Little ?Help from another person taking care of personal grooming?: A Little ?Help from another person toileting, which includes using toliet, bedpan, or urinal?: Total ?Help from another person bathing (including washing, rinsing, drying)?: A Lot ?Help from another person to put on and taking off regular upper body clothing?: A Lot ?Help from another person to put on and taking off regular lower body clothing?: Total ?6 Click Score: 12 ? ?  ?End of Session Equipment Utilized During Treatment: Oxygen (1L) ? ?OT Visit Diagnosis: Other abnormalities of gait and mobility (R26.89) ?  ?Activity Tolerance Patient tolerated treatment well ?  ?Patient Left in bed;with call bell/phone within reach;with bed alarm set ?  ?Nurse Communication   ?  ? ?   ? ?Time: [id number] ?OT Time Calculation (min): 13 min ? ?Charges: OT General Charges ?$OT Visit: 1 Visit ?OT Treatments ?$Therapeutic Activity: 8-22 mins ? ?Reginald Ayers, OTR/L ?Acute Rehabilitation Services ?Pager: [phone] ?Office: [phone]  ?Reginald Ayers ?12/25/2021, 3:16 PM ?

## 2021-12-25 NOTE — Progress Notes (Signed)
? ? ?Subjective  -  ? ?Transferred to 5 W. from the ICU. ?No complaints this morning ? ? ? ?Physical Exam: ? ?Neuro: No focal deficits ?Pulmonary: Remains on oxygen but breathing is nonlabored ?Abdomen: Remains soft and ? ? ? ? ? ? ?Assessment/Plan:  ? ?Carotid stenosis: Patient has a known occlusion of his left internal carotid artery.  He has had progression of the stenosis on the right side and recently had left MCA distribution acute infarct.  This is likely secondary to his chronic occlusion and hypoperfusion.  Potentially could be atrial fibrillation.  Regardless, I think given the high-grade stenosis on the right, this needs to be addressed.  He recently got a CT angiogram of the neck with reduced contrast (creatinine was 1.5 today).  This shows a calcified lesion at his right carotid bifurcation.  I do believe that he is a candidate for TCAR.  I would like to get this done as soon as possible.  I have him scheduled for tomorrow, however I want to make sure that from a pulmonary and medical standpoint that he is stable enough to proceed with general anesthesia for right carotid TCAR.  I discussed the details of the procedure with the patient including the risk of bleeding and stroke.  He is in agreement to proceed if clinically indicated. ? ?The patient will need to be on aspirin and Plavix.  I will start aspirin today.  He would also need to be on a statin.  It sounds like he has an allergy to atorvastatin but I do not know if he has tried other medications.  I would favor starting him on Crestor 10 mg ? ? I also follow the patient for lower extremity vascular disease.  I recently saw him for right leg wounds and was planning a right lower extremity arteriogram.  This obviously will be on hold until his carotid issues are resolved. ? ?[name] ?12/25/2021 ?9:53 AM ?-- ? ?Vitals:  ? 12/25/21 0742 12/25/21 0917  ?BP: 116/72   ?Pulse: (!) 126   ?Resp: (!) 37   ?Temp: (!) 97.5 ?F (36.4 ?C)   ?SpO2: 96% 97%   ? ? ?Intake/Output Summary (Last 24 hours) at 12/25/2021 0953 ?Last data filed at 12/24/2021 2300 ?Gross per 24 hour  ?Intake 1001.83 ml  ?Output 1150 ml  ?Net -148.17 ml  ? ? ? ?Laboratory ?CBC ?   ?Component Value Date/Time  ? WBC 8.8 12/25/2021 0202  ? HGB 9.8 (L) 12/25/2021 0202  ? HCT 30.7 (L) 12/25/2021 0202  ? PLT 207 12/25/2021 0202  ? ? ?BMET ?   ?Component Value Date/Time  ? NA 143 12/25/2021 0202  ? K 3.2 (L) 12/25/2021 0202  ? CL 107 12/25/2021 0202  ? CO2 22 12/25/2021 0202  ? GLUCOSE 224 (H) 12/25/2021 0202  ? BUN 31 (H) 12/25/2021 0202  ? CREATININE 1.52 (H) 12/25/2021 0202  ? CALCIUM 8.1 (L) 12/25/2021 0202  ? GFRNONAA 48 (L) 12/25/2021 0202  ? GFRAA >60 09/25/2017 2016  ? ? ?COAG ?Lab Results  ?Component Value Date  ? INR 1.5 10/26/2008  ? INR 1.1 10/24/2008  ? ?No results found for: PTT ? ?Antibiotics ?Anti-infectives (From admission, onward)  ? ? Start     Dose/Rate Route Frequency Ordered Stop  ? 12/21/21 1200  cefTRIAXone (ROCEPHIN) 2 g in sodium chloride 0.9 % 100 mL IVPB       ? 2 g ?200 mL/hr over 30 Minutes Intravenous Every 24 hours 12/20/21 1444  12/24/21 1430  ? 12/20/21 1500  cefTRIAXone (ROCEPHIN) 1 g in sodium chloride 0.9 % 100 mL IVPB       ?Note to Pharmacy: Was supposed to be given 2 g but ED is order set only gives 1 g  ? 1 g ?200 mL/hr over 30 Minutes Intravenous  Once 12/20/21 1443 12/20/21 2112  ? 12/20/21 1415  cefTRIAXone (ROCEPHIN) 1 g in sodium chloride 0.9 % 100 mL IVPB       ? 1 g ?200 mL/hr over 30 Minutes Intravenous  Once 12/20/21 1409 12/20/21 1556  ? 12/20/21 1415  azithromycin (ZITHROMAX) 500 mg in sodium chloride 0.9 % 250 mL IVPB  Status:  Discontinued       ? 500 mg ?250 mL/hr over 60 Minutes Intravenous Every 24 hours 12/20/21 1409 12/23/21 1214  ? ?  ? ? ? ?V. Leia Alf, M.D., FACS ?[facility] ?Office: [phone] ?Pager:  [phone]  ?

## 2021-12-25 NOTE — Progress Notes (Addendum)
ANTICOAGULATION CONSULT NOTE ?Pharmacy Consult for heparin ?Indication: atrial fibrillation ?Brief A/P: Heparin level is therapeutic ? ?Allergies  ?Allergen Reactions  ? Actos [Pioglitazone] Shortness Of Breath  ?  Leg swelling   ? Atorvastatin   ?  Other reaction(s): leg pain  ? Lisinopril Cough  ? ? ?Patient Measurements: ?Height: 5\' 3"  (160 cm) ?Weight: 55.5 kg (122 lb 5.7 oz) ?IBW/kg (Calculated) : 56.9 ?Heparin Dosing Weight: 51.2 kg ? ?Vital Signs: ?Temp: 99.1 ?F (37.3 ?C) (05/04 1550) ?Temp Source: Oral (05/04 1550) ?BP: 155/65 (05/04 1550) ?Pulse Rate: 113 (05/04 1931) ? ?Labs: ?Recent Labs  ?  12/23/21 ?0459 12/23/21 ?0654 12/24/21 ?0220 12/24/21 ?1644 12/24/21 ?1801 12/25/21 ?0202 12/25/21 ?1149 12/25/21 ?1848  ?HGB 9.0*  --  8.4*  --   --  9.8*  --   --   ?HCT 26.8*  --  26.3*  --   --  30.7*  --   --   ?PLT 211  --  207  --   --  207  --   --   ?HEPARINUNFRC  --    < > 0.28*  --    < > 0.22* 0.46 0.52  ?CREATININE 1.71*   < > 1.60* 1.53*  --  1.52*  --   --   ?TROPONINIHS 244*  --   --   --   --   --   --   --   ? < > = values in this interval not displayed.  ? ? ?Estimated Creatinine Clearance: 34.5 mL/min (A) (by C-G formula based on SCr of 1.52 mg/dL (H)). ? ?Assessment: ?71 y.o. male with Afib and new CVA for heparin. Infusion recently increased to 1250 units/hr (therapeutic level earlier today). Team considering eliquis post TCAR. ? ?Confirmatory level back this evening at 0.52 which is just above therapeutic limit for our neuro heparin protocol. H/H stable. No issues with heparin infusion or s/s of bleeding per RN. ? ?Goal of Therapy:  ?Heparin level 0.3-0.5 units/ml ?Monitor platelets by anticoagulation protocol: Yes ?  ?Plan:  ?Slightly decrease heparin to 1200 units/hr ?Check heparin level at 0500 ?Monitor for s/s of hemorrhage ?F/u plan for transition to eliquis ? ?61, PharmD, BCPS ?12/25/2021 7:40 PM ?ED Clinical Pharmacist -  [phone] ? ?

## 2021-12-25 NOTE — Progress Notes (Signed)
ANTICOAGULATION CONSULT NOTE ?Pharmacy Consult for heparin ?Indication: atrial fibrillation ?Brief A/P: Heparin level subtherapeutic Increase Heparin rate ? ?Allergies  ?Allergen Reactions  ? Actos [Pioglitazone] Shortness Of Breath  ?  Leg swelling   ? Atorvastatin   ?  Other reaction(s): leg pain  ? Lisinopril Cough  ? ? ?Patient Measurements: ?Height: 5\' 3"  (160 cm) ?Weight: 49.3 kg (108 lb 11 oz) ?IBW/kg (Calculated) : 56.9 ?Heparin Dosing Weight: 51.2 kg ? ?Vital Signs: ?Temp: 98.9 ?F (37.2 ?C) (05/04 0013) ?Temp Source: Oral (05/04 0013) ?BP: 145/71 (05/04 0126) ?Pulse Rate: 105 (05/04 0126) ? ?Labs: ?Recent Labs  ?  12/22/21 ?1107 12/22/21 ?2135 12/23/21 ?02/22/22 12/23/21 ?0654 12/24/21 ?02/23/22 12/24/21 ?1644 12/24/21 ?1801 12/25/21 ?0202  ?HGB  --    < > 9.0*  --  8.4*  --   --  9.8*  ?HCT  --   --  26.8*  --  26.3*  --   --  30.7*  ?PLT  --   --  211  --  207  --   --  207  ?HEPARINUNFRC  --    < >  --    < > 0.28*  --  0.19* 0.22*  ?CREATININE 1.75*  --  1.71*   < > 1.60* 1.53*  --  1.52*  ?TROPONINIHS 432*  --  244*  --   --   --   --   --   ? < > = values in this interval not displayed.  ? ? ? ?Estimated Creatinine Clearance: 30.6 mL/min (A) (by C-G formula based on SCr of 1.52 mg/dL (H)). ? ?Assessment: ?71 y.o. male with Afib and new CVA for heparin ? ?Goal of Therapy:  ?Heparin level 0.3-0.5 units/ml ?Monitor platelets by anticoagulation protocol: Yes ?  ?Plan:  ?Increase Heparin 1250  units/hr ?Check heparin level in 8 hours. ? ?61, PharmD, BCPS ? ?' ? ?

## 2021-12-25 NOTE — Progress Notes (Signed)
Speech Language Pathology Treatment: Dysphagia  ?Patient Details ?Name: Reginald Ayers ?MRN: [id number] ?DOB: Mar 06, 1951 ?Today's Date: 12/25/2021 ?Time: 7829-5621 ?SLP Time Calculation (min) (ACUTE ONLY): 30 min ? ?Assessment / Plan / Recommendation ?Clinical Impression ? Pt seen for skilled SLP to address dysphagia goals.  He is alert and returned from CTA this morning. Pt is xerostomic, dysarthric and desires water.  Interview of pt revealed pt with weaker voice than normal but he states his swallow is baseline.  Assisted pt to consume nectar thick Shake via straw and tsp, water via tsp and provale cup and single ice chips.  Pt clinical indications of possible delay in pharyngeal swallow trigger and concern for airway infiltraiton of "shake" c/b immediate cough post-swallow - moderately intense.  Pt denies sensing that "it went th wrong way" causing SLP to suspect potential chronic dysphagia, cough with po.  Small single cup boluses tolerated better without aspiration s/s.  SLP provided pt with Provale cup for thin water, which he was able to self feed.  Instructed RN to Provale cup usage for water only and pt can be independent with cup.  He does report he likes the other cup better but reported understanding to concern for potential aspiration.  Recommend pt undergo MBS some time during hospital coarse given neuro hx, dysphonia, intubation, and concerns for dysphagia but will defer until after surgery to allow for adjustments in function. ?  ?HPI HPI: 71 yo male presented 4/29 with sepsis secondary to CAP, requiring intubation and mechanical ventilation for respiratory failure. ETT 4/29-5/2. MRI: punctate left temporal infarct; chronic cortical infarcts in the left middle and superior frontal gyri.  History of bilateral carotid artery stenosis (total occlusion of the left, moderate stenosis of the right), HTN, HLD, prior tobacco abuse, diabetes, coronary artery disease s/p CABG, peripheral vascular disease s/p  stents. Follow up for dysphagia management indicated. Pt  potentially for vascular surgery5/12/2021. ?  ?   ?SLP Plan ? Continue with current plan of care ? ?  ?  ?Recommendations for follow up therapy are one component of a multi-disciplinary discharge planning process, led by the attending physician.  Recommendations may be updated based on patient status, additional functional criteria and insurance authorization. ?  ? ?Recommendations  ?Diet recommendations: Dysphagia 1 (puree);Nectar-thick liquid (provale cup use ok with thin water) ?Liquids provided via: Cup;Straw ?Medication Administration: Whole meds with puree ?Supervision: Patient able to self feed;Full supervision/cueing for compensatory strategies ?Compensations: Small sips/bites;Slow rate;Minimize environmental distractions ?Postural Changes and/or Swallow Maneuvers: Seated upright 90 degrees;Upright 30-60 min after meal  ?   ?    ?   ? ? ? ? Oral Care Recommendations: Oral care BID;Staff/trained caregiver to provide oral care ?Follow Up Recommendations: Other (comment) (TBD) ?Assistance recommended at discharge: Frequent or constant Supervision/Assistance ?SLP Visit Diagnosis: Dysphagia, unspecified (R13.10) ?Plan: Continue with current plan of care ? ? ? ? ?  ? Rolena Infante, MS CCC SLP ?[facility] ?Office [phone] ?Pager [phone] ? ? ? ?[name] ? ?12/25/2021, 12:15 PM ?

## 2021-12-25 NOTE — Consult Note (Addendum)
?Cardiology Consultation:  ? ?Patient ID: Reginald GalaWilliam H Comley ?MRN: [id number]; DOB: April 14, 1951 ? ?Admit date: 12/20/2021 ?Date of Consult: 12/25/2021 ? ?PCP:  Renford DillsPolite, Ronald, MD ?  ?[facility] Providers ?Cardiologist:  Nanetta BattyJonathan Berry, MD  ? ?Patient Profile:  ? ?Reginald Ayers is a 71 y.o. male with a hx of CAD s/p CABG 09/2008, hypertension, hyperlipidemia, PAD, tobacco abuse, DM 2, COPD, and carotid artery disease who is being seen 12/25/2021 for the evaluation of preoperative risk evaluation for TCAR at the request of Dr. Jerral RalphGhimire. ? ?History of Present Illness:  ? ?[name] has a history of CAD and CABG 09/2008 (LIMA-LAD, left radial-OM 1, SVG-RI, SVG-acute marginal-distal RCA).  He has followed with Dr. Allyson SabalBerry and now [facility] for significant peripheral arterial disease.  He has known carotid artery disease with occluded left internal carotid artery and moderate right ICA stenosis.  Nuclear stress tests in 2015 and 2019 were nonischemic. He now follows with [facility] for PAD and claudication.  He is s/p right superficial femoral artery stenting in January 2018, atherectomy with angioplasty of right posterior tibial artery 11/2016, drug-coated balloon angioplasty of right superficial femoral artery and balloon angioplasty of the right common femoral artery in 03/2017.  Diagnostic angiogram with patent right superficial femoral artery and flush occlusion of this left superficial femoral artery in January 2019.  [facility] has followed his Doppler studies of his carotids and lower extremities. ? ?He was last seen by Dr. Allyson SabalBerry in clinic 07/30/2021 and was doing well at that time.  He was recently seen by Dr. Myra GianottiBrabham 12/15/2021 with claudication.  He was recommended for aortogram to evaluate concern for right femoral artery in-stent stenosis, but patient did not want to schedule citing feeling weak, difficulty walking, productive cough, and dizziness. ? ?[facility] with episode of slurred speech earlier in the day, vomiting,  and shortness of breath.  Patient decompensated in the ER with an O2 sat of 84% on room air with crackles bilaterally.  He was intubated for airway protection.  Antibiotics were started for sepsis due to pneumonia.  He was acidotic with an AKI.  Severe metabolic acidosis treated with bicarb drip, he temporarily required pressors..  He developed A-fib RVR.  On exam he was not moving his left arm.  MRI brain revealed a small stroke and neurology was consulted.  He was hypotensive in the 50s.  He converted to sinus rhythm while in MRI and Levophed was weaned.   ? ?Per neurology: Punctate infarct likely related to hypoperfusion in the setting of hypotension secondary to sepsis/sedation/A-fib with RVR.  Left MCA territory is vulnerable given chronic left ICA occlusion and known prior right moderate stenosis.  He was continued on Plavix 75 mg for history of CABG.  Heparin was held in the acute setting after stroke to avoid increased risk of hemorrhagic transformation.  Carotid duplex with right carotid artery stenosis of 80 to 99% and occlusion of the left ICA.  CTA head and neck with occluded left internal carotid artery throughout the neck, atherosclerosis in the right carotid bifurcation and within proximal right ICA, severe stenosis in the right ICA.  [facility] was consulted and has recommended TCAR.  Cardiology was consulted for perioperative risk stratification.  He was extubated 12/23/2021. ? ?He is sitting up in bed during my exam. He states his roommate called EMS because he wasn't moving. He doesn't really remember anything prior to extubation. He denies chest pain and SOB leading up to this hospitalization. He  was able to walk 1-2 blocks outside and could grocery shop. He denies current chest pain and palpitations.  ? ?Past Medical History:  ?Diagnosis Date  ? Carotid artery disease (HCC)   ? L-ICA 100%, mod R-ICA dz  ? COPD (chronic obstructive pulmonary disease) (HCC)   ? Coronary artery disease   ? s/p CABG February  2010 by Dr. Andrey Spearman  ? Diabetes (HCC)   ? Emphysema lung (HCC)   ? Hyperlipidemia   ? Hypertension   ? Peripheral arterial disease (HCC)   ? post left common iliac and right SFA stenting remotely  ? Tobacco abuse   ?    ? ? ?Past Surgical History:  ?Procedure Laterality Date  ? ABDOMINAL AORTOGRAM N/A 04/20/2017  ? Procedure: ABDOMINAL AORTOGRAM;  Surgeon: Nada Libman, MD;  [facility];  Service: Cardiovascular;  Laterality: N/A;  ? ABDOMINAL AORTOGRAM W/LOWER EXTREMITY Right 12/08/2016  ? Procedure: Abdominal Aortogram w/Lower Extremity;  Surgeon: Nada Libman, MD;  Location: [facility];  Service: Cardiovascular;  Laterality: Right;  ? ABDOMINAL AORTOGRAM W/LOWER EXTREMITY N/A 09/07/2017  ? Procedure: ABDOMINAL AORTOGRAM W/LOWER EXTREMITY;  Surgeon: Nada Libman, MD;  Location: [facility];  Service: Cardiovascular;  Laterality: N/A;  ? CARDIAC CATHETERIZATION  2010  ? CAROTID ANGIOGRAM  2014  ? CORONARY ARTERY BYPASS GRAFT  2010  ? LIMA-LAD, Lrad-OM1, SVG-RI, SVG-AM-dRCA  ? DOPPLER ECHOCARDIOGRAPHY  2010  ? LOWER EXTREMITY ANGIOGRAM  11/2006  ? left common femoral endarterectomy and patch angioplasty:The mid right SFA was angioplastied with a 4 x 8  ? LOWER EXTREMITY ANGIOGRAM  2003  ? left common iliac artery stenting by Dr. Erlene Quan  ? LOWER EXTREMITY ANGIOGRAPHY Right 04/20/2017  ? Procedure: Lower Extremity Angiography;  Surgeon: Nada Libman, MD;  Location: [facility];  Service: Cardiovascular;  Laterality: Right;  ? NM MYOVIEW LTD  2015  ? PERIPHERAL VASCULAR ATHERECTOMY Right 12/08/2016  ? Procedure: Peripheral Vascular Atherectomy;  Surgeon: Nada Libman, MD;  Location: [facility];  Service: Cardiovascular;  Laterality: Right;  ? PERIPHERAL VASCULAR BALLOON ANGIOPLASTY Right 12/08/2016  ? Procedure: Peripheral Vascular Balloon Angioplasty;  Surgeon: Nada Libman, MD;  Location: [facility];  Service: Cardiovascular;   Laterality: Right;  ? PERIPHERAL VASCULAR BALLOON ANGIOPLASTY Right 04/20/2017  ? Procedure: PERIPHERAL VASCULAR BALLOON ANGIOPLASTY;  Surgeon: Nada Libman, MD;  [facility]: [facility];  Service: Cardiovascular;  Laterality: Right;  SFA  ? PERIPHERAL VASCULAR CATHETERIZATION N/A 09/22/2016  ? Procedure: Abdominal Aortogram w/ bilateral Lower Extremity Runoff;  Surgeon: Nada Libman, MD;  [facility]: [facility];  Service: Cardiovascular;  Laterality: N/A;  ? PERIPHERAL VASCULAR CATHETERIZATION Right 09/22/2016  ? Procedure: Peripheral Vascular Intervention;  Surgeon: Nada Libman, MD;  Location: [facility] INVASIVE CV LAB;  Service: Cardiovascular;  Laterality: Right;  ?  ? ?Home Medications:  ?Prior to Admission medications   ?Medication Sig Start Date End Date Taking? Authorizing Provider  ?albuterol (VENTOLIN HFA) 108 (90 Base) MCG/ACT inhaler Inhale 2 puffs into the lungs every 4 (four) hours as needed. 01/18/20  Yes [provider]  ?clopidogrel (PLAVIX) 75 MG tablet Take 75 mg by mouth daily.  07/24/13  Yes [provider]  ?Cyanocobalamin (VITAMIN B 12) 500 MCG TABS Take 1,000 mcg by mouth daily.   Yes [provider]  ?empagliflozin (JARDIANCE) 25 MG TABS tablet Take 25 mg by mouth daily.  Yes [provider]  ?ferrous sulfate 324 MG TBEC Take 324 mg by mouth daily with breakfast.   Yes [provider]  ?furosemide (LASIX) 20 MG tablet Take 20 mg by mouth daily.   Yes [provider]  ?Homeopathic Products (LEG CRAMP RELIEF SL) Place 2 tablets under the tongue at bedtime as needed (leg cramps).   Yes [provider]  ?ibuprofen (ADVIL,MOTRIN) 200 MG tablet Take 400-800 mg by mouth every 8 (eight) hours as needed for moderate pain.   Yes [provider]  ?losartan (COZAAR) 50 MG tablet Take 50 mg by mouth daily. 07/10/21  Yes [provider]  ?metFORMIN (GLUCOPHAGE) 500 MG tablet Take 1,000 mg by mouth 2 (two) times  daily with a meal.  07/24/13  Yes [provider]  ?metoprolol succinate (TOPROL-XL) 25 MG 24 hr tablet TAKE 1 TABLET(25 MG) BY MOUTH DAILY ?Patient taking differently: Take 25 mg by mouth daily. 07/29/20

## 2021-12-26 ENCOUNTER — Encounter (HOSPITAL_COMMUNITY): Payer: Self-pay | Admitting: Pulmonary Disease

## 2021-12-26 ENCOUNTER — Encounter (HOSPITAL_COMMUNITY)
Admission: EM | Disposition: A | Discharge status: Home Health | Payer: Self-pay | Source: Home / Self Care | Attending: Internal Medicine

## 2021-12-26 ENCOUNTER — Inpatient Hospital Stay (HOSPITAL_COMMUNITY): Payer: Medicare Other | Admitting: Anesthesiology

## 2021-12-26 ENCOUNTER — Inpatient Hospital Stay (HOSPITAL_COMMUNITY): Payer: Medicare Other

## 2021-12-26 ENCOUNTER — Other Ambulatory Visit: Payer: Self-pay

## 2021-12-26 ENCOUNTER — Other Ambulatory Visit (HOSPITAL_COMMUNITY): Payer: Self-pay

## 2021-12-26 DIAGNOSIS — J9601 Acute respiratory failure with hypoxia: Secondary | ICD-10-CM | POA: Diagnosis not present

## 2021-12-26 DIAGNOSIS — Z951 Presence of aortocoronary bypass graft: Secondary | ICD-10-CM | POA: Diagnosis not present

## 2021-12-26 DIAGNOSIS — I10 Essential (primary) hypertension: Secondary | ICD-10-CM

## 2021-12-26 DIAGNOSIS — I63231 Cerebral infarction due to unspecified occlusion or stenosis of right carotid arteries: Secondary | ICD-10-CM

## 2021-12-26 DIAGNOSIS — I63132 Cerebral infarction due to embolism of left carotid artery: Secondary | ICD-10-CM | POA: Diagnosis not present

## 2021-12-26 DIAGNOSIS — J189 Pneumonia, unspecified organism: Secondary | ICD-10-CM | POA: Diagnosis not present

## 2021-12-26 DIAGNOSIS — N179 Acute kidney failure, unspecified: Secondary | ICD-10-CM | POA: Diagnosis not present

## 2021-12-26 DIAGNOSIS — I6521 Occlusion and stenosis of right carotid artery: Secondary | ICD-10-CM | POA: Diagnosis not present

## 2021-12-26 DIAGNOSIS — E1151 Type 2 diabetes mellitus with diabetic peripheral angiopathy without gangrene: Secondary | ICD-10-CM

## 2021-12-26 DIAGNOSIS — I2510 Atherosclerotic heart disease of native coronary artery without angina pectoris: Secondary | ICD-10-CM

## 2021-12-26 HISTORY — PX: TRANSCAROTID ARTERY REVASCULARIZATIONÂ: SHX6778

## 2021-12-26 HISTORY — PX: ULTRASOUND GUIDANCE FOR VASCULAR ACCESS: SHX6516

## 2021-12-26 LAB — MAGNESIUM: Magnesium: 2.1 mg/dL (ref 1.7–2.4)

## 2021-12-26 LAB — BASIC METABOLIC PANEL
Anion gap: 11 (ref 5–15)
BUN: 34 mg/dL — ABNORMAL HIGH (ref 8–23)
CO2: 23 mmol/L (ref 22–32)
Calcium: 8.5 mg/dL — ABNORMAL LOW (ref 8.9–10.3)
Chloride: 111 mmol/L (ref 98–111)
Creatinine, Ser: 1.7 mg/dL — ABNORMAL HIGH (ref 0.61–1.24)
GFR, Estimated: 42 mL/min — ABNORMAL LOW (ref >60)
Glucose, Bld: 353 mg/dL — ABNORMAL HIGH (ref 70–99)
Potassium: 4.4 mmol/L (ref 3.5–5.1)
Sodium: 145 mmol/L (ref 135–145)

## 2021-12-26 LAB — GLUCOSE, CAPILLARY
Glucose-Capillary: 368 mg/dL — ABNORMAL HIGH (ref 70–99)
Glucose-Capillary: 344 mg/dL — ABNORMAL HIGH (ref 70–99)
Glucose-Capillary: 202 mg/dL — ABNORMAL HIGH (ref 70–99)
Glucose-Capillary: 120 mg/dL — ABNORMAL HIGH (ref 70–99)
Glucose-Capillary: 152 mg/dL — ABNORMAL HIGH (ref 70–99)
Glucose-Capillary: 148 mg/dL — ABNORMAL HIGH (ref 70–99)

## 2021-12-26 LAB — POCT ACTIVATED CLOTTING TIME: Activated Clotting Time: 257 seconds

## 2021-12-26 LAB — HEPARIN LEVEL (UNFRACTIONATED): Heparin Unfractionated: 0.52 IU/mL (ref 0.30–0.70)

## 2021-12-26 IMAGING — CT CT HEAD CODE STROKE
2 of 3 series · 13 of 47 positions shown, 16 images · non-contrast
Comparison: Noncontrast head CT and CT angiogram head/neck
[DATE]. Brain MRI [DATE].

CLINICAL DATA: Code stroke. Provided history: Post procedure,
possible stroke.



[Series 3: head 5.0 st · axial · 0.45mm/px · z∈[-140,+5]mm · 10 of 35 slices shown, 13 images]
[im 3/35  brain]
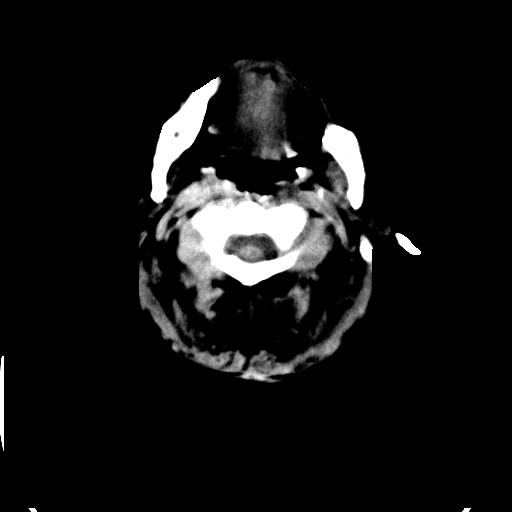
[im 3/35  bone]
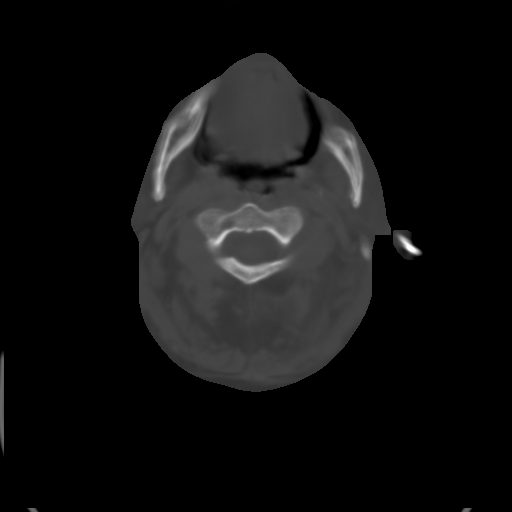
[im 6/35  brain]
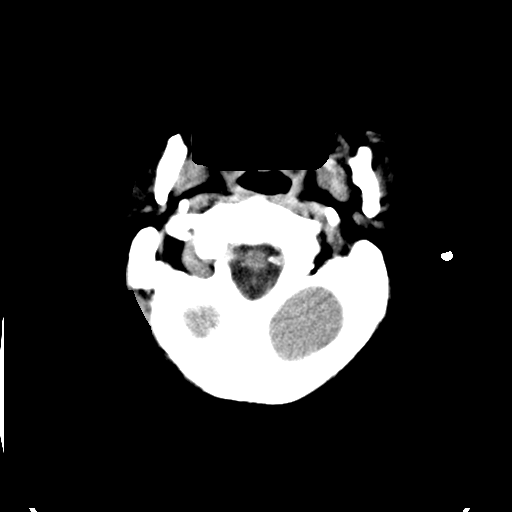
[im 10/35  brain]
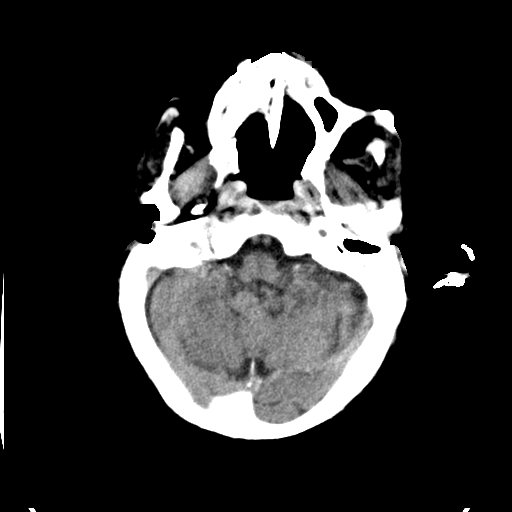
[im 12/35  brain]
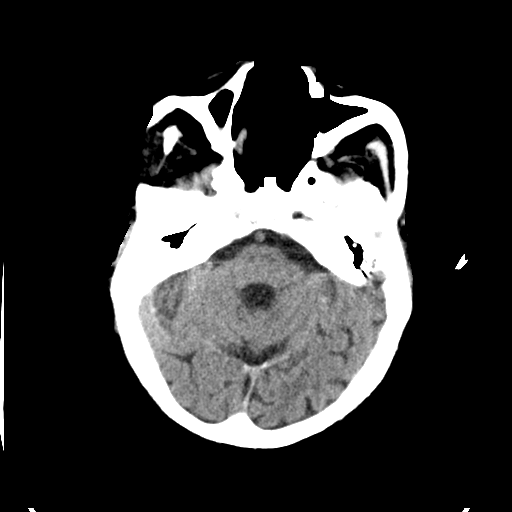
[im 16/35  brain]
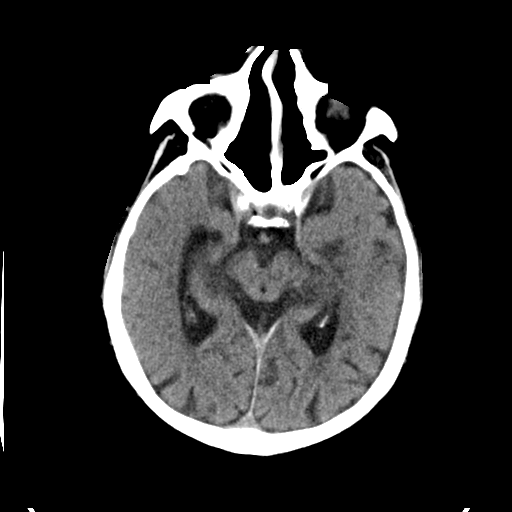
[im 16/35  bone]
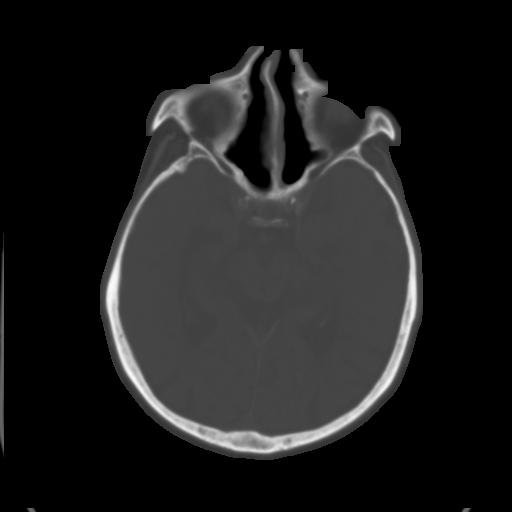
[im 19/35  brain]
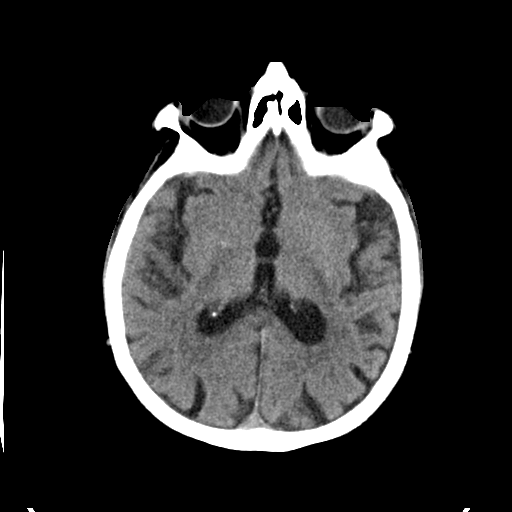
[im 23/35  brain]
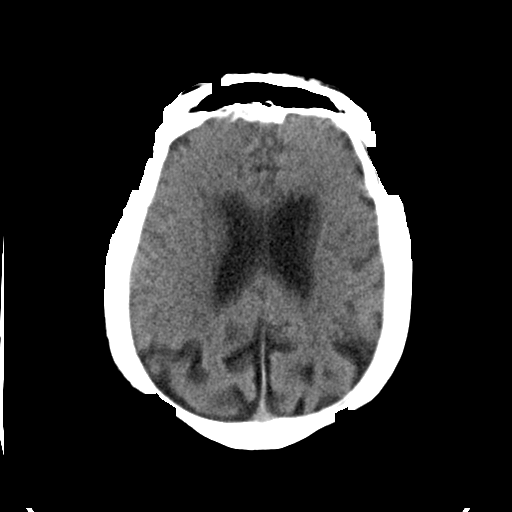
[im 26/35  brain]
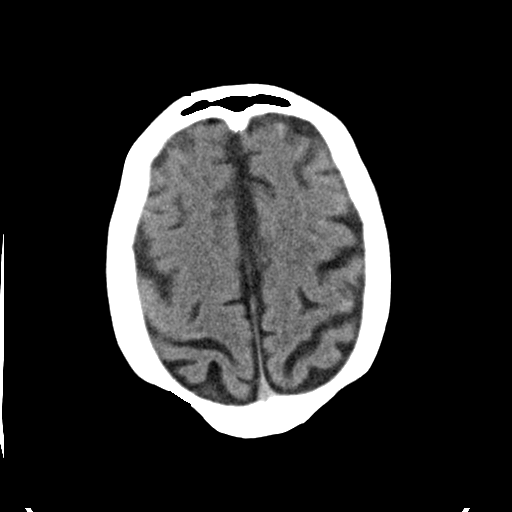
[im 29/35  brain]
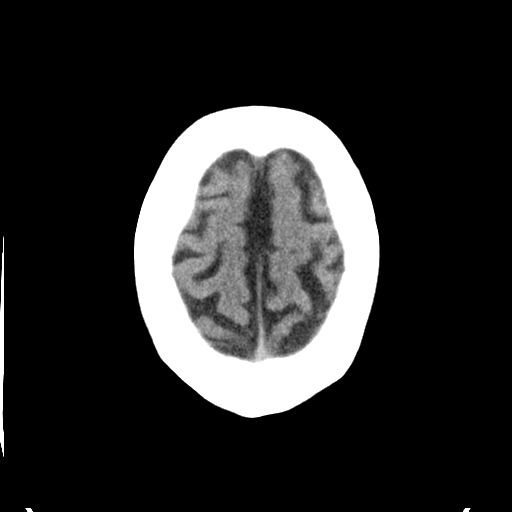
[im 29/35  bone]
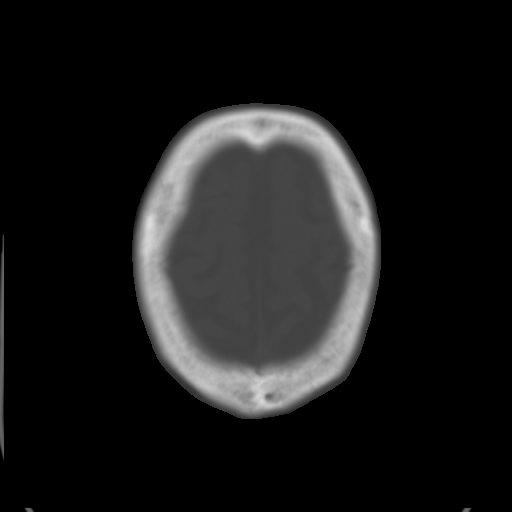
[im 32/35  brain]
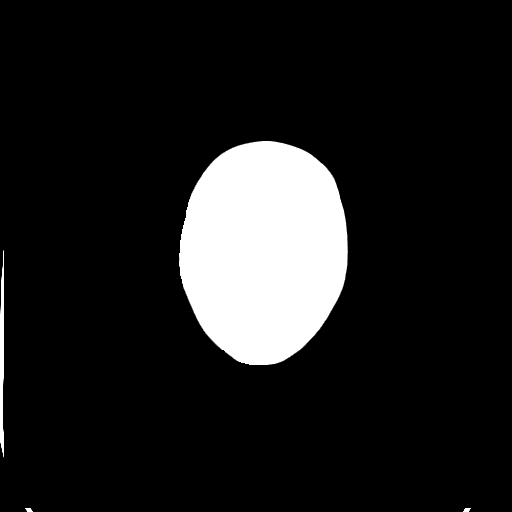

[Series 5: head 3.0 cor st · coronal · 0.34mm/px · 3 of 67 slices shown]
[im 23/67  brain]
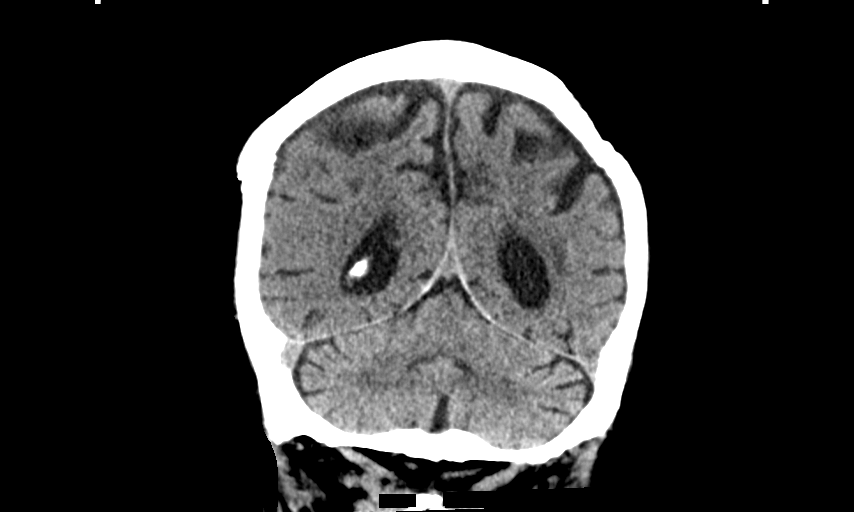
[im 30/67  brain]
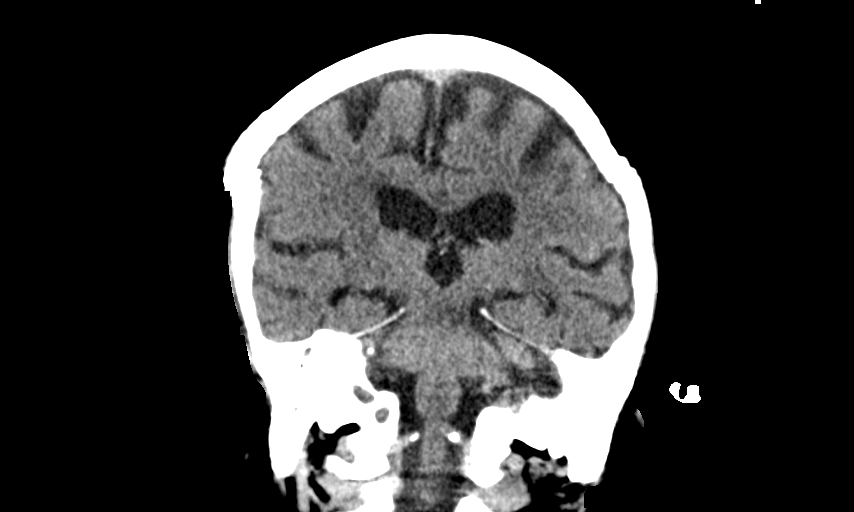
[im 37/67  brain]
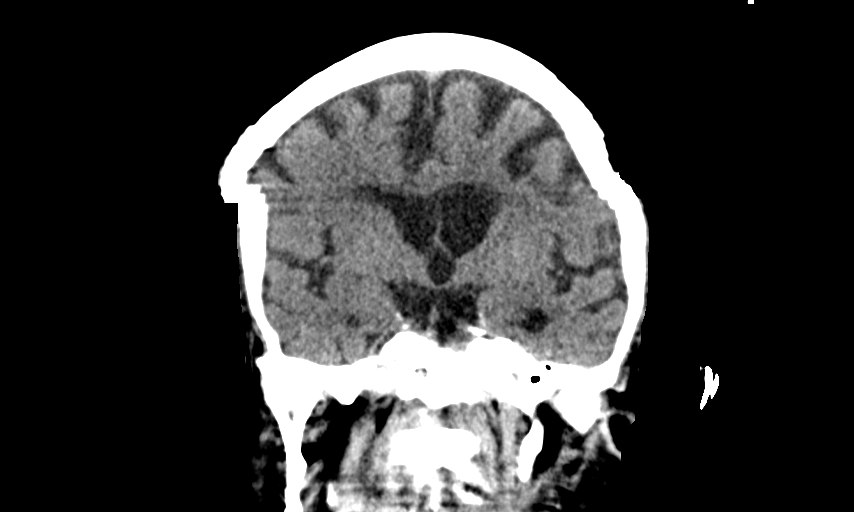

[13 of 47 positions shown; findings below may reference images not displayed]

FINDINGS: The examination is moderately motion degraded, limiting evaluation.

Brain:

Mild generalized parenchymal atrophy.

A known tiny subacute infarct within the posterior left temporal
lobe white matter was better appreciated on the recent prior brain
MRI of [DATE].

Known small chronic cortical infarcts within the left frontal lobe.

Background mild patchy and ill-defined hypoattenuation within the
cerebral white matter, nonspecific but compatible with chronic small
vessel ischemic disease.

Small chronic infarct within the right cerebellar hemisphere, better
appreciated on the prior MRI.

Within the limitations of motion degradation, no acute intracranial
hemorrhage, acute demarcated cortical infarct, extra-axial fluid
collection or intracranial mass is identified. No midline shift.

Vascular: No hyperdense vessel.  Atherosclerotic calcifications.

Skull: No acute fracture or aggressive osseous lesion. Nonspecific
chronic thinning of the bilateral parietal calvarium.

Sinuses/Orbits: No mass or acute finding within the imaged orbits.
Postsurgical appearance of the paranasal sinuses. Mild mucosal
thickening within the paranasal sinuses. Additionally, small-volume
frothy secretions are present within the bilateral sphenoid and
maxillary sinuses.

Other: Bilateral mastoid effusions.

ASPECTS (Alberta Stroke Program Early CT Score)

- Ganglionic level infarction (caudate, lentiform nuclei, internal
capsule, insula, M1-M3 cortex): 7

- Supraganglionic infarction (M4-M6 cortex): 3

Total score (0-10 with 10 being normal): 10

No acute intracranial abnormality identified. These results were
communicated to Dr. AMAZIGH At [DATE] pmon [DATE] by text page via the
[employer] messaging system.
IMPRESSION: Moderately motion degraded exam, limiting evaluation.

A known tiny subacute infarct within the posterior left temporal
lobe white matter was better appreciated on the recent prior brain
MRI of [DATE].

Within the limitations of motion degradation, no interval acute
intracranial abnormality is identified.

Parenchymal atrophy, chronic small vessel ischemic disease and
chronic infarcts as described.

Paranasal sinus disease. Correlate for acute on chronic sinusitis.

Bilateral mastoid effusions.

## 2021-12-26 IMAGING — MR MR HEAD W/O CM
7 of 12 series · 27 of 48 positions shown · non-contrast
Comparison: [DATE]

CLINICAL DATA: Stroke follow-up

EXAM:
MRI HEAD WITHOUT CONTRAST
TECHNIQUE: Multiplanar, multiecho pulse sequences of the brain and surrounding
structures were obtained without intravenous contrast.

[Series 2: DWI · axial · 3.0mm · 0.94mm/px · z∈[-57,+75]mm · 7 of 100 slices shown (1 of 2)]
[im 1/100]
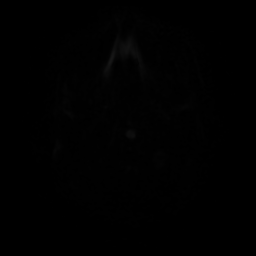
[im 17/100]
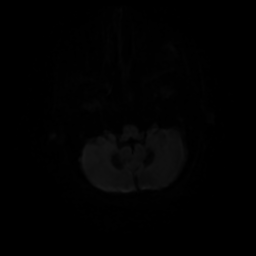
[im 34/100]
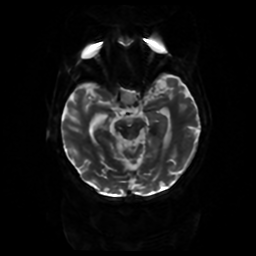
[im 50/100]
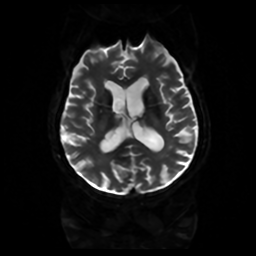
[im 67/100]
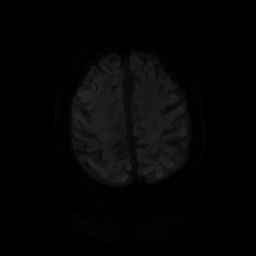
[im 83/100]
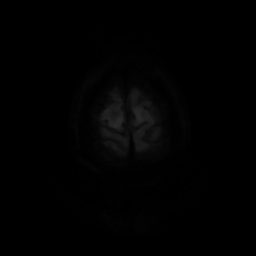
[im 100/100]
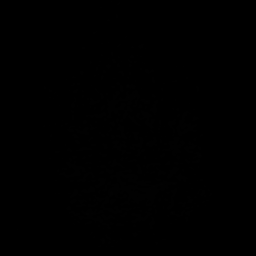

[Series 3: DWI · coronal · 4.0mm · 0.94mm/px · 6 of 72 slices shown (2 of 2)]
[im 1/72]
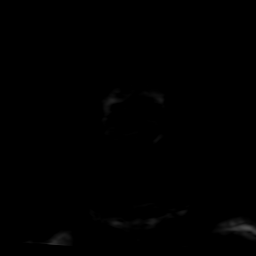
[im 15/72]
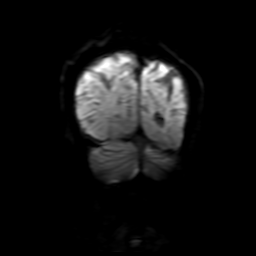
[im 29/72]
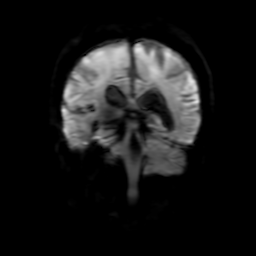
[im 43/72]
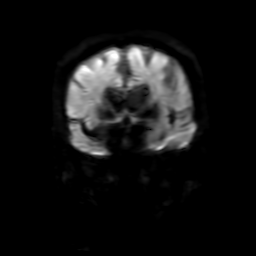
[im 57/72]
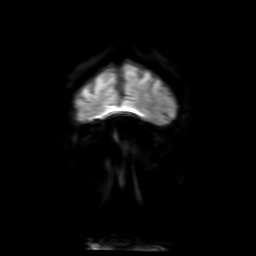
[im 72/72]
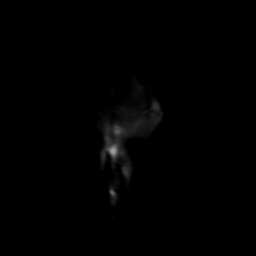

[Series 5: FLAIR · sagittal · 5.0mm · 0.23mm/px · 2 of 25 slices shown (1 of 3)]
[im 1/25]
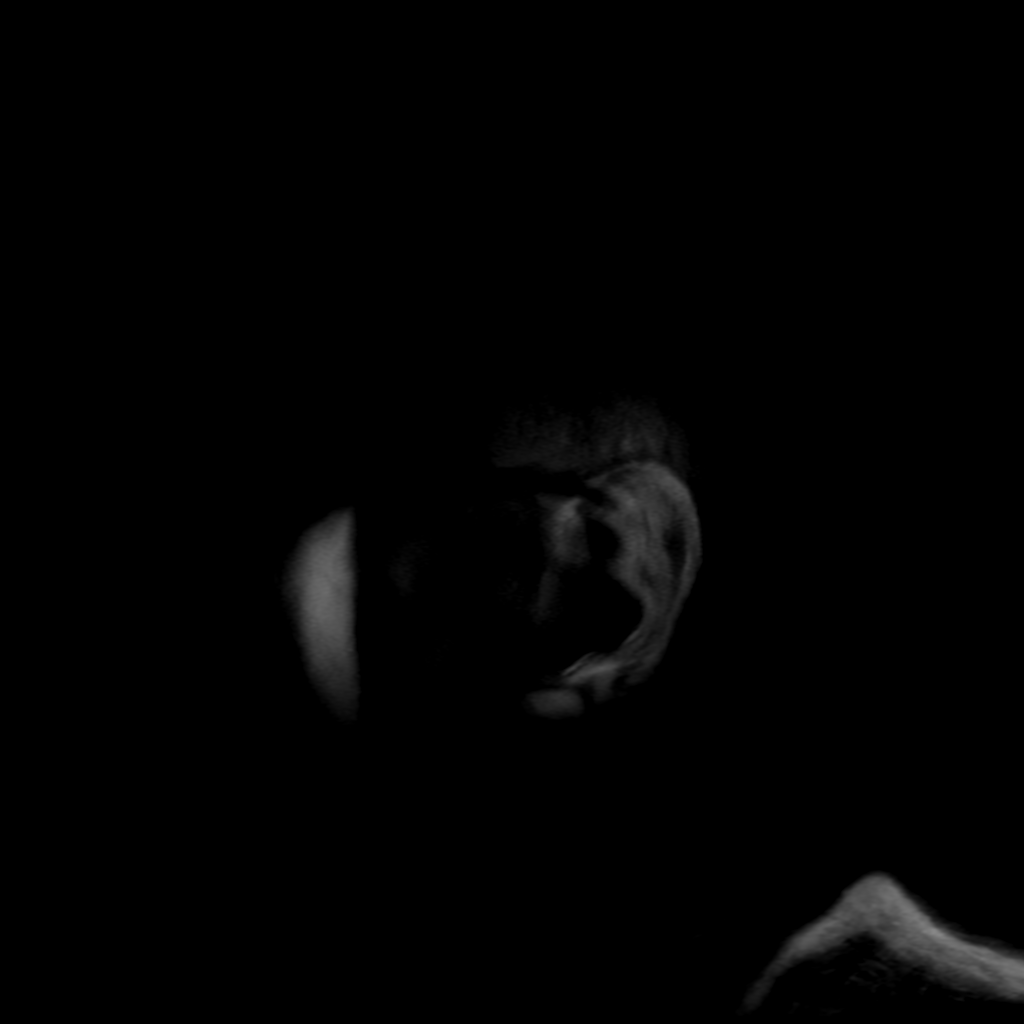
[im 25/25]
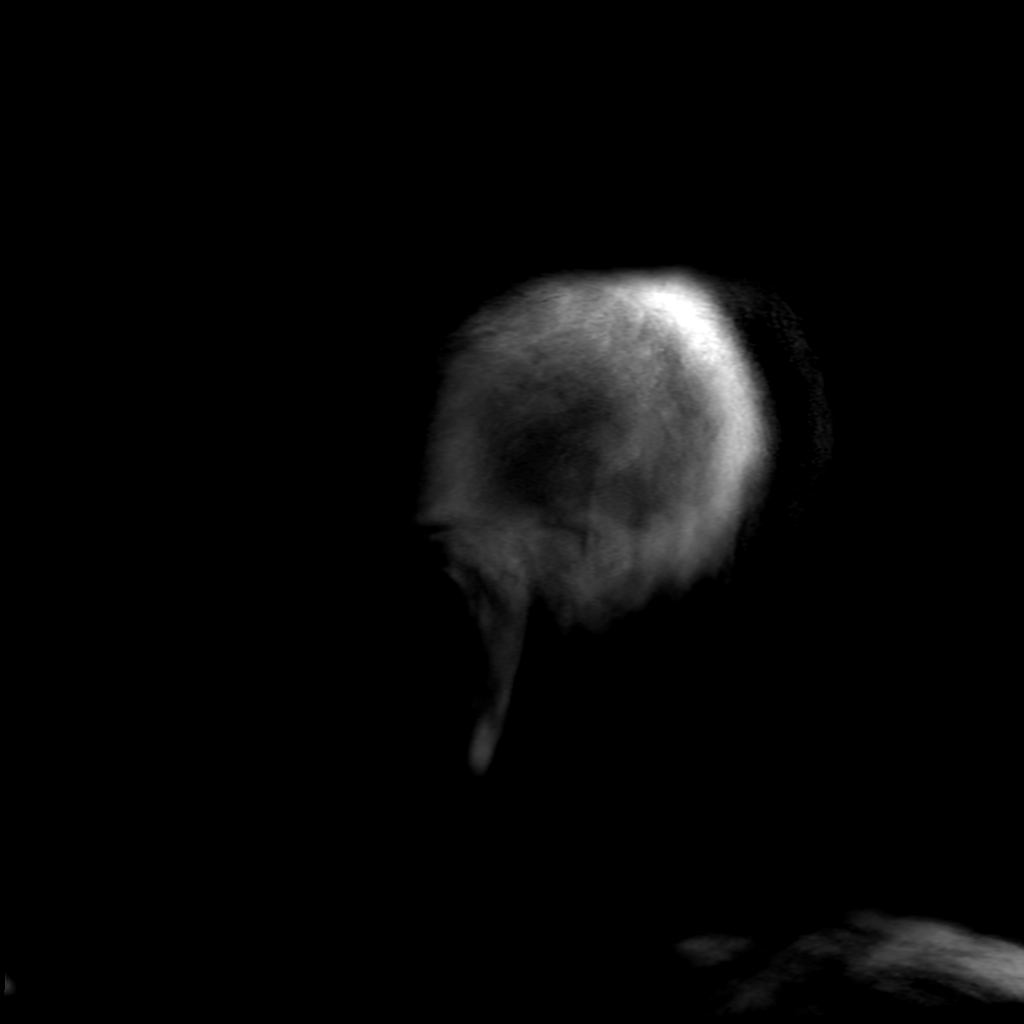

[Series 8: FLAIR · axial · 4.0mm · 0.45mm/px · z∈[-149,+9]mm · 3 of 37 slices shown (2 of 3)]
[im 1/37]
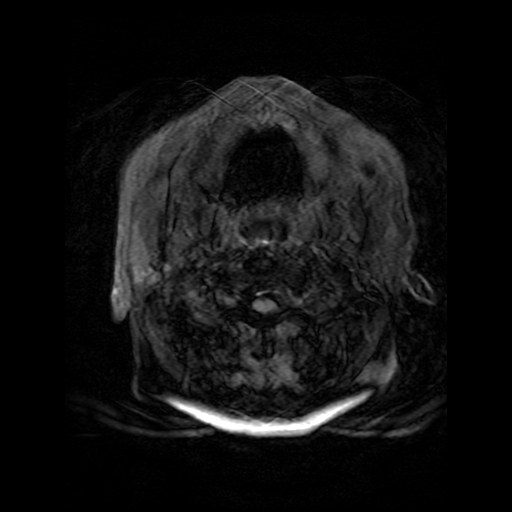
[im 19/37]
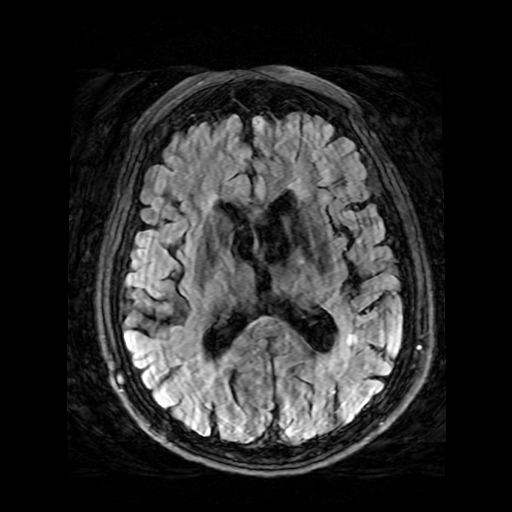
[im 37/37]
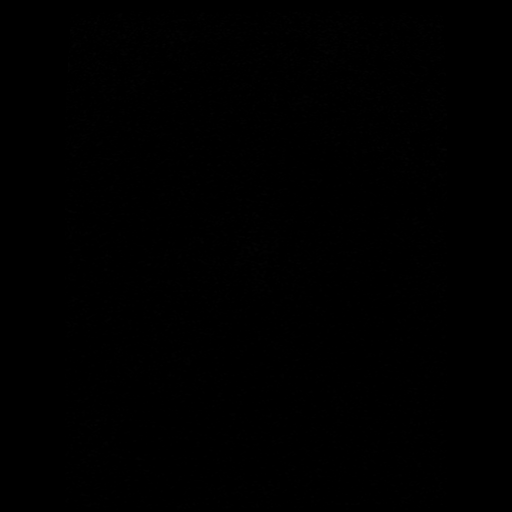

[Series 12: FLAIR · axial · 5.0mm · 0.47mm/px · z∈[-147,+9]mm · 2 of 27 slices shown (3 of 3)]
[im 1/27]
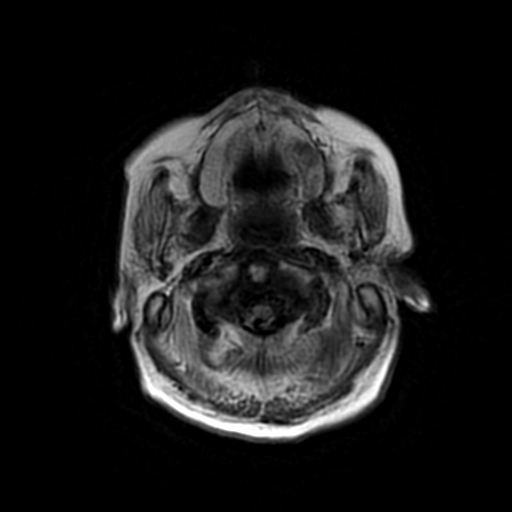
[im 27/27]
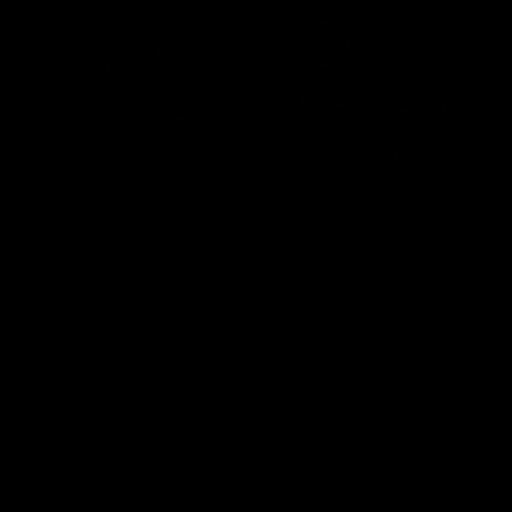

[Series 250: ADC · axial · 3.0mm · 0.94mm/px · z∈[-57,+70]mm · 4 of 48 slices shown (1 of 2)]
[im 1/48]
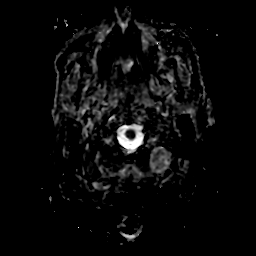
[im 16/48]
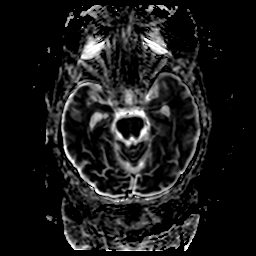
[im 32/48]
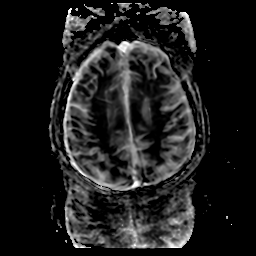
[im 48/48]
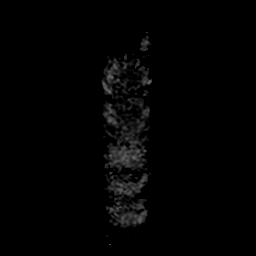

[Series 350: ADC · coronal · 4.0mm · 0.94mm/px · 3 of 36 slices shown (2 of 2)]
[im 1/36]
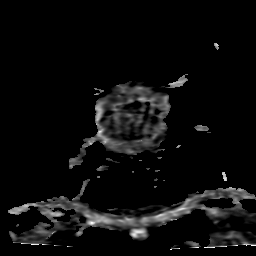
[im 18/36]
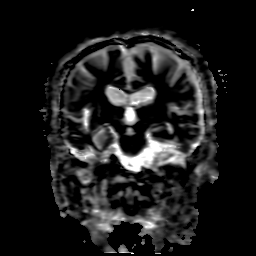
[im 36/36]
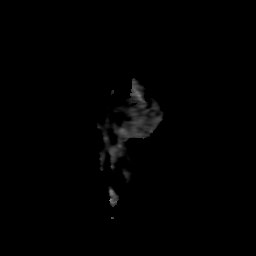

[27 of 48 positions shown; findings below may reference images not displayed]

FINDINGS: Motion degraded study.

Brain: No acute infarct. The area of diffusion abnormality
previously seen in the left temporal lobe is less conspicuous today.
No acute or chronic hemorrhage. There is multifocal hyperintense
T2-weighted signal within the white matter. Generalized atrophy. The
midline structures are normal.

Vascular: Major flow voids are preserved.

Skull and upper cervical spine: Normal calvarium and skull base.
Visualized upper cervical spine and soft tissues are normal.

Sinuses/Orbits:No paranasal sinus fluid levels or advanced mucosal
thickening. No mastoid or middle ear effusion. Normal orbits.
IMPRESSION: 1. Motion degraded study.
2. No acute intracranial abnormality. The area of diffusion
abnormality previously seen in the left temporal lobe is less
conspicuous today.

## 2021-12-26 SURGERY — TRANSCAROTID ARTERY REVASCULARIZATION (TCAR)
Anesthesia: General | Site: Groin | Laterality: Right

## 2021-12-26 MED ORDER — LIDOCAINE HCL (PF) 1 % IJ SOLN
INTRAMUSCULAR | Status: AC
  Filled 2021-12-26: qty 30

## 2021-12-26 MED ORDER — PROPOFOL 10 MG/ML IV BOLUS
INTRAVENOUS | Status: AC
  Filled 2021-12-26: qty 20

## 2021-12-26 MED ORDER — ONDANSETRON HCL 4 MG/2ML IJ SOLN
INTRAMUSCULAR | Status: DC | PRN
  Administered 2021-12-26: 4 mg via INTRAVENOUS

## 2021-12-26 MED ORDER — INSULIN ASPART 100 UNIT/ML IJ SOLN: 0.0000 [IU] | INTRAMUSCULAR | Status: DC | PRN

## 2021-12-26 MED ORDER — LACTATED RINGERS IV SOLN
INTRAVENOUS | Status: DC | PRN
Start: 2021-12-26 — End: 2021-12-26

## 2021-12-26 MED ORDER — ALBUMIN HUMAN 5 % IV SOLN
INTRAVENOUS | Status: AC
  Filled 2021-12-26: qty 250

## 2021-12-26 MED ORDER — ALBUMIN HUMAN 5 % IV SOLN
12.5000 g | Freq: Once | INTRAVENOUS | Status: AC
Start: 2021-12-26 — End: 2021-12-26
  Administered 2021-12-26: 12.5 g via INTRAVENOUS

## 2021-12-26 MED ORDER — PROTAMINE SULFATE 10 MG/ML IV SOLN
INTRAVENOUS | Status: DC | PRN
  Administered 2021-12-26: 5 mg via INTRAVENOUS
  Administered 2021-12-26: 15 mg via INTRAVENOUS
  Administered 2021-12-26 (×3): 10 mg via INTRAVENOUS

## 2021-12-26 MED ORDER — HEMOSTATIC AGENTS (NO CHARGE) OPTIME
TOPICAL | Status: DC | PRN
  Administered 2021-12-26: 1 via TOPICAL

## 2021-12-26 MED ORDER — IOHEXOL 350 MG/ML SOLN
50.0000 mL | Freq: Once | INTRAVENOUS | Status: AC | PRN
  Administered 2021-12-26: 50 mL via INTRAVENOUS

## 2021-12-26 MED ORDER — HEPARIN 6000 UNIT IRRIGATION SOLUTION
Status: AC
  Filled 2021-12-26: qty 500

## 2021-12-26 MED ORDER — PROPOFOL 10 MG/ML IV BOLUS
INTRAVENOUS | Status: DC | PRN
  Administered 2021-12-26: 50 mg via INTRAVENOUS

## 2021-12-26 MED ORDER — PHENYLEPHRINE 80 MCG/ML (10ML) SYRINGE FOR IV PUSH (FOR BLOOD PRESSURE SUPPORT)
PREFILLED_SYRINGE | INTRAVENOUS | Status: DC | PRN
  Administered 2021-12-26 (×3): 80 ug via INTRAVENOUS

## 2021-12-26 MED ORDER — CHLORHEXIDINE GLUCONATE 0.12 % MT SOLN
OROMUCOSAL | Status: AC
  Administered 2021-12-26: 15 mL via OROMUCOSAL
  Filled 2021-12-26: qty 15

## 2021-12-26 MED ORDER — EPHEDRINE SULFATE-NACL 50-0.9 MG/10ML-% IV SOSY
PREFILLED_SYRINGE | INTRAVENOUS | Status: DC | PRN
  Administered 2021-12-26: 5 mg via INTRAVENOUS
  Administered 2021-12-26: 10 mg via INTRAVENOUS

## 2021-12-26 MED ORDER — CEFAZOLIN SODIUM-DEXTROSE 2-4 GM/100ML-% IV SOLN
2.0000 g | INTRAVENOUS | Status: AC
  Administered 2021-12-26: 2 g via INTRAVENOUS

## 2021-12-26 MED ORDER — FENTANYL CITRATE (PF) 250 MCG/5ML IJ SOLN
INTRAMUSCULAR | Status: DC | PRN
  Administered 2021-12-26: 50 ug via INTRAVENOUS

## 2021-12-26 MED ORDER — LIDOCAINE 2% (20 MG/ML) 5 ML SYRINGE
INTRAMUSCULAR | Status: DC | PRN
  Administered 2021-12-26: 40 mg via INTRAVENOUS

## 2021-12-26 MED ORDER — LACTATED RINGERS IV SOLN: INTRAVENOUS | Status: DC

## 2021-12-26 MED ORDER — FENTANYL CITRATE (PF) 100 MCG/2ML IJ SOLN: 25.0000 ug | INTRAMUSCULAR | Status: DC | PRN

## 2021-12-26 MED ORDER — FENTANYL CITRATE (PF) 250 MCG/5ML IJ SOLN
INTRAMUSCULAR | Status: AC
  Filled 2021-12-26: qty 5

## 2021-12-26 MED ORDER — CHLORHEXIDINE GLUCONATE 0.12 % MT SOLN: 15.0000 mL | OROMUCOSAL | Status: AC

## 2021-12-26 MED ORDER — CEFAZOLIN SODIUM-DEXTROSE 2-4 GM/100ML-% IV SOLN
INTRAVENOUS | Status: AC
  Filled 2021-12-26: qty 100

## 2021-12-26 MED ORDER — HEPARIN SODIUM (PORCINE) 1000 UNIT/ML IJ SOLN
INTRAMUSCULAR | Status: DC | PRN
  Administered 2021-12-26: 5000 [IU] via INTRAVENOUS

## 2021-12-26 MED ORDER — HEPARIN 6000 UNIT IRRIGATION SOLUTION
Status: DC | PRN
  Administered 2021-12-26: 1

## 2021-12-26 MED ORDER — PHENYLEPHRINE HCL-NACL 20-0.9 MG/250ML-% IV SOLN
INTRAVENOUS | Status: DC | PRN
Start: 2021-12-26 — End: 2021-12-26
  Administered 2021-12-26: 50 ug/min via INTRAVENOUS

## 2021-12-26 MED ORDER — 0.9 % SODIUM CHLORIDE (POUR BTL) OPTIME
TOPICAL | Status: DC | PRN
  Administered 2021-12-26: 1000 mL

## 2021-12-26 MED ORDER — ROCURONIUM BROMIDE 10 MG/ML (PF) SYRINGE
PREFILLED_SYRINGE | INTRAVENOUS | Status: DC | PRN
  Administered 2021-12-26: 70 mg via INTRAVENOUS

## 2021-12-26 MED ORDER — IODIXANOL 320 MG/ML IV SOLN
INTRAVENOUS | Status: DC | PRN
  Administered 2021-12-26: 20 mL via INTRA_ARTERIAL

## 2021-12-26 SURGICAL SUPPLY — 55 items
ADH SKN CLS APL DERMABOND .7 (GAUZE/BANDAGES/DRESSINGS) ×4
AGENT HMST KT MTR STRL THRMB (HEMOSTASIS) ×2
BAG BANDED W/RUBBER/TAPE 36X54 (MISCELLANEOUS) ×4 IMPLANT
BAG COUNTER SPONGE SURGICOUNT (BAG) ×4 IMPLANT
BAG EQP BAND 135X91 W/RBR TAPE (MISCELLANEOUS) ×2
BAG SPNG CNTER NS LX DISP (BAG) ×2
BALLN STERLING RX 5X30X80 (BALLOONS) ×3
BALLOON STERLING RX 5X30X80 (BALLOONS) IMPLANT
CANISTER SUCT 3000ML PPV (MISCELLANEOUS) ×4 IMPLANT
CATH SUCT 10FR WHISTLE TIP (CATHETERS) ×4 IMPLANT
CLIP VESOCCLUDE MED 6/CT (CLIP) ×4 IMPLANT
CLIP VESOCCLUDE SM WIDE 6/CT (CLIP) ×4 IMPLANT
COVER DOME SNAP 22 D (MISCELLANEOUS) ×4 IMPLANT
COVER PROBE W GEL 5X96 (DRAPES) ×5 IMPLANT
DERMABOND ADVANCED (GAUZE/BANDAGES/DRESSINGS) ×2
DERMABOND ADVANCED .7 DNX12 (GAUZE/BANDAGES/DRESSINGS) ×3 IMPLANT
DRAPE FEMORAL ANGIO 80X135IN (DRAPES) ×4 IMPLANT
ELECT REM PT RETURN 9FT ADLT (ELECTROSURGICAL) ×3
ELECTRODE REM PT RTRN 9FT ADLT (ELECTROSURGICAL) ×3 IMPLANT
GLOVE SURG SS PI 7.5 STRL IVOR (GLOVE) ×12 IMPLANT
GOWN STRL REUS W/ TWL LRG LVL3 (GOWN DISPOSABLE) ×6 IMPLANT
GOWN STRL REUS W/ TWL XL LVL3 (GOWN DISPOSABLE) ×3 IMPLANT
GOWN STRL REUS W/TWL LRG LVL3 (GOWN DISPOSABLE) ×6
GOWN STRL REUS W/TWL XL LVL3 (GOWN DISPOSABLE) ×3
GUIDEWIRE ENROUTE 0.014 (WIRE) ×4 IMPLANT
HEMOSTAT SNOW SURGICEL 2X4 (HEMOSTASIS) IMPLANT
INTRODUCER KIT GALT 7CM (INTRODUCER) ×3
KIT BASIN OR (CUSTOM PROCEDURE TRAY) ×4 IMPLANT
KIT ENCORE 26 ADVANTAGE (KITS) ×4 IMPLANT
KIT INTRODUCER GALT 7 (INTRODUCER) ×3 IMPLANT
KIT TURNOVER KIT B (KITS) ×4 IMPLANT
NDL HYPO 25GX1X1/2 BEV (NEEDLE) IMPLANT
NEEDLE HYPO 25GX1X1/2 BEV (NEEDLE) IMPLANT
PACK CAROTID (CUSTOM PROCEDURE TRAY) ×4 IMPLANT
POSITIONER HEAD DONUT 9IN (MISCELLANEOUS) ×4 IMPLANT
SET MICROPUNCTURE 5F STIFF (MISCELLANEOUS) IMPLANT
STENT TRANSCAROTID SYSTEM 8X40 (Permanent Stent) ×1 IMPLANT
STOPCOCK 4 WAY LG BORE MALE ST (IV SETS) ×1 IMPLANT
SURGIFLO W/THROMBIN 8M KIT (HEMOSTASIS) ×1 IMPLANT
SUT PROLENE 5 0 C 1 24 (SUTURE) ×8 IMPLANT
SUT PROLENE 6 0 BV (SUTURE) IMPLANT
SUT SILK 2 0 PERMA HAND 18 BK (SUTURE) ×4 IMPLANT
SUT SILK 2 0 SH (SUTURE) ×4 IMPLANT
SUT VIC AB 3-0 SH 27 (SUTURE) ×6
SUT VIC AB 3-0 SH 27X BRD (SUTURE) ×6 IMPLANT
SUT VIC AB 4-0 PS2 27 (SUTURE) ×4 IMPLANT
SYR 10ML LL (SYRINGE) ×12 IMPLANT
SYR 20ML LL LF (SYRINGE) ×4 IMPLANT
SYR CONTROL 10ML LL (SYRINGE) IMPLANT
SYSTEM TRANSCAROTID NEUROPRTCT (MISCELLANEOUS) ×3 IMPLANT
TOWEL GREEN STERILE (TOWEL DISPOSABLE) ×4 IMPLANT
TRANSCAROTID NEUROPROTECT SYS (MISCELLANEOUS) ×3
TUBING CIL FLEX 10 FLL-RA (TUBING) ×1 IMPLANT
WATER STERILE IRR 1000ML POUR (IV SOLUTION) ×4 IMPLANT
WIRE BENTSON .035X145CM (WIRE) ×4 IMPLANT

## 2021-12-26 NOTE — Op Note (Signed)
? ? ?Patient name: Reginald Ayers MRN: [id number] DOB: April 22, 1951 Sex: male ? ?12/26/2021 ?Pre-operative Diagnosis: Symptomatic right carotid stenosis ?Post-operative diagnosis:  Same ?Surgeon:  Annamarie Major ?Assistants:  Jorene Minors, PA ?Procedure:   #1: Right carotid stent (TCAR) ?  #2: Ultrasound-guided access, left common femoral vein ?  #3: Retrograde flow reversal neuro protection ?Anesthesia:  General ?Blood Loss:  Minimal ?Specimens:  none ? ?Findings:  >80% stenosis resolved after stenting ? ?Pre-Dilation balloon:  5x30 ?Post Dilation balloon: 5x30 ?Stent:  ENROUTE 8x40 ?Contrast: 20cc ?Procedure time:  54 minutes ?Flow reversal Time:  12 minutes ?Dose Area:  2.9829 ?Flouro:  2.9 minutes ? ? ?Indications: This is a 71 year old gentleman who presented with shortness of breath requiring intubation for generalized weakness.  He was found to be septic secondary to pneumonia.  He was also having slurred speech and A-fib with RVR.  He was hypotensive.  He underwent MRI which showed acute ischemia in the left MCA territory.  It was felt that this was secondary to hypoperfusion given his greater than 80% right carotid stenosis and known left carotid occlusion.  He was ultimately extubated.  He was cleared for surgery by medicine as well as cardiology although he did have elevated risk.  I discussed with the patient that we could proceed with right TCAR for stroke prevention.  He is in agreement. ? ?Procedure:  The patient was identified in the holding area and taken to [place] [address]  The patient was then placed supine on the table. general anesthesia was administered.  The patient was prepped and draped in the usual sterile fashion.  A time out was called and antibiotics were administered.  A PA was necessary to expedite the procedure and assist with technical details. ? ?The PA evaluated the left femoral artery with ultrasound and cannulated the left common femoral vein under ultrasound guidance with a  micropuncture needle under my supervision.  A 018 wire was inserted without resistance followed placement of micropuncture sheath.  An 035 wire was then inserted and the TCAR sheath was in place.  A transverse incision was then made above the right clavicle.  Cautery was used about subcutaneous tissue and platysma muscle.  An avascular plane was developed between the 2 heads of the sternocleidomastoid and then I dissected out the common carotid artery, protecting the internal jugular vein and the vagus nerve.  The artery was soft and noncalcified.  The patient was then fully heparinized.  A 5-0 Prolene U stitch was placed in the carotid adventitia.  I then cannulated the common carotid artery with a micropuncture needle and inserted the 018 wire up to the mark on the wire.  A micropuncture sheath was then inserted up to 3 cm.  A contrast injection was then performed locating the carotid bifurcation.  A 035 carotid Amplatz wire was then inserted and the TCAR sheath was then placed.  It was sutured into position with 2-0 silk.  Flow reversal tubing was then connected.  Passive flow reversal was confirmed with a saline flush.  Next, a TCAR timeout was performed.  Hemodynamics were appropriate as was the ACT. ? ?Active flow reversal was then initiated by tightening the vessel loop on the proximal carotid artery.  The image detector was rotated to open the bifurcation.  A contrast injection was then performed locating the vessels.  There was at least an 80% carotid stenosis.  Next a 018 wire and a 5 x 30 balloon were advanced to the  level of the stenosis without difficulty.  Predilation was performed with a 5 x 30 balloon to nominal pressure.  The patient had a significant hemodynamic response with bradycardia.  He returned to baseline heart rate with deflation of the balloon.  I then selected a ENROUTE 8 x 40 stent and deployed this across the lesion.  After 2 minutes, I performed arteriogram.  I did not feel the stent  was fully expanded and so a postdilatation 5 x 30 balloon was then inflated within the stent.  Final angiogram showed widely patent carotid artery.  There were no access site complications.  I then removed the wire.  Flow reversal was discontinued.  The TCAR sheath was removed and the arteriotomy was closed by securing the 5-0 Prolene.  Hand-held Doppler was used to evaluate the carotid artery and had normal signals.  50 mg of protamine was used to reverse the heparin.  The groin sheath was removed and manual pressure was held for hemostasis.  The carotid incision was irrigated.  Hemostasis was achieved.  The platysma muscle was closed with 3-0 Vicryl followed by skin particular closure.  Dermabond was applied.  Upon extubation, the patient was not moving his left side.  A code stroke was called. ? ? ?Disposition: Following extubation, a code stroke was called because the patient was not moving his left side. ? ? ?V. Annamarie Major, M.D., FACS ?[facility] ?Office: [phone] ?Pager:  [phone]  ?

## 2021-12-26 NOTE — Transfer of Care (Signed)
Immediate Anesthesia Transfer of Care Note ? ?Patient: Reginald Ayers ? ?Procedure(s) Performed: Right Transcarotid Artery Revascularization (Right) ?ULTRASOUND GUIDANCE FOR VASCULAR ACCESS, LEFT FEMORAL VEIN (Left: Groin) ? ?Patient Location: PACU ? ?Anesthesia Type:General ? ?Level of Consciousness: drowsy ? ?Airway & Oxygen Therapy: Patient Spontanous Breathing and Patient connected to face mask oxygen ? ?Post-op Assessment: Report given to RN and Post -op Vital signs reviewed and stable ? ?Post vital signs: Reviewed and stable  ?Patient was unable to move Right side after extubation, there is concern for stroke, patient to get stat CT  ?Last Vitals:  ?Vitals Value Taken Time  ?BP 105/73 12/26/21 1525  ?Temp    ?Pulse    ?Resp 29 12/26/21 1525  ?SpO2 97 % 12/26/21 1523  ?Vitals shown include unvalidated device data. ? ?Last Pain:  ?Vitals:  ? 12/26/21 1228  ?TempSrc:   ?PainSc: 0-No pain  ?   ? ?Patients Stated Pain Goal: 2 (12/26/21 1228) ? ?Complications: No notable events documented. ?

## 2021-12-26 NOTE — Progress Notes (Signed)
OR RN called out to come to PACU and told us to call a code stroke at 1515.  We called a code stroke and the stroke RN, Jarrett Soho was here on patients arrival.  Last know well time was about 1420. Dr. Trula Slade at bedside. Patient taken to CT at 1530.  ?

## 2021-12-26 NOTE — Code Documentation (Signed)
Stroke Response Nurse Documentation ?Code Documentation ? ?Reginald Ayers is a 72 y.o. male admitted to [facility]  on 12/20/2021 for Acute respiratory distress and septic pneumonia with past medical hx of CAD, COPD, CAD, Diabetes, HLP, HTN, PAD. On aspirin 81 mg daily, clopidogrel 75 mg daily, and heparin IV. Code stroke was activated by PACU .  ? ?Patient coming out of surgery where he was LKW at 1342 and now complaining of left sided weakness, right gaze, and left hemianopnia, and expressive aphasia. Pt was alert and oriented prior to induction with bilateral arm movement.   ? ?Stroke team at the bedside after patient activation. Patient to CT with team. NIHSS 22, see documentation for details and code stroke times. Patient with disoriented, right gaze preference , left hemianopia, left facial droop, left arm weakness, left leg weakness, left decreased sensation, Expressive aphasia , and dysarthria  on exam. The following imaging was completed:  CT Head and CTA. Patient is not a candidate for IV Thrombolytic due to recent surgery. Patient is not a candidate for IR due to no LVO noted on imaging.   ? ?Care/Plan: Admit to ICU, Q1 mNIHSS/VS per MD Erlinda Hong.  ? ?Bedside handoff with RN Levada Dy.   ? ?Kathrin Greathouse  ?Stroke Response RN ? ? ?

## 2021-12-26 NOTE — Anesthesia Preprocedure Evaluation (Addendum)
Anesthesia Evaluation  ?Patient identified by MRN, date of birth, ID band ?Patient awake ? ? ? ?Reviewed: ?Allergy & Precautions, NPO status , Patient's Chart, lab work & pertinent test results ? ?Airway ?Mallampati: I ? ?TM Distance: >3 FB ?Neck ROM: Full ? ? ? Dental ? ?(+) Edentulous Upper, Edentulous Lower ?  ?Pulmonary ?pneumonia, unresolved, COPD, former smoker,  ?  ?+ rhonchi ? ?+ decreased breath sounds ? ? ? ? ? Cardiovascular ?hypertension, Pt. on medications and Pt. on home beta blockers ?+ CAD, + CABG and + Peripheral Vascular Disease  ? ?Rhythm:Regular Rate:Normal ? ?Echo: ??1. Left ventricular ejection fraction, by estimation, is 50 to 55%. The  ?left ventricle has low normal function. The left ventricle demonstrates  ?regional wall motion abnormalities (see scoring diagram/findings for  ?description). Left ventricular diastolic  ??parameters were normal. There is dyskinesis of the left ventricular,  ?apical apical segment.  ??2. Right ventricular systolic function was not well visualized. The right  ?ventricular size is normal. There is normal pulmonary artery systolic  ?pressure.  ??3. The mitral valve is grossly normal. Trivial mitral valve  ?regurgitation. No evidence of mitral stenosis. Moderate mitral annular  ?calcification.  ??4. The aortic valve was not well visualized. There is mild calcification  ?of the aortic valve. Aortic valve regurgitation is not visualized. Aortic  ?valve sclerosis/calcification is present, without any evidence of aortic  ?stenosis.  ??5. The inferior vena cava is normal in size with <50% respiratory  ?variability, suggesting right atrial pressure of 8 mmHg.  ?  ?Neuro/Psych ?CVA   ? GI/Hepatic ?Neg liver ROS, GERD  ,  ?Endo/Other  ?diabetes, Type 2, Oral Hypoglycemic Agents ? Renal/GU ?  ? ?  ?Musculoskeletal ?negative musculoskeletal ROS ?(+)  ? Abdominal ?Normal abdominal exam  (+)   ?Peds ? Hematology ?negative hematology ROS ?(+)    ?Anesthesia Other Findings ? ? Reproductive/Obstetrics ? ?  ? ? ? ? ? ? ? ? ? ? ? ? ? ?  ?  ? ? ? ? ? ? ?Anesthesia Physical ?Anesthesia Plan ? ?ASA: 4 ? ?Anesthesia Plan: General  ? ?Post-op Pain Management:   ? ?Induction: Intravenous ? ?PONV Risk Score and Plan: 3 and Ondansetron and Treatment may vary due to age or medical condition ? ?Airway Management Planned: Oral ETT ? ?Additional Equipment: Arterial line ? ?Intra-op Plan:  ? ?Post-operative Plan: Possible Post-op intubation/ventilation ? ?Informed Consent: I have reviewed the patients History and Physical, chart, labs and discussed the procedure including the risks, benefits and alternatives for the proposed anesthesia with the patient or authorized representative who has indicated his/her understanding and acceptance.  ? ? ? ?Dental advisory given ? ?Plan Discussed with: CRNA ? ?Anesthesia Plan Comments:   ? ? ? ? ?Anesthesia Quick Evaluation ? ?

## 2021-12-26 NOTE — Progress Notes (Signed)
SLP Cancellation Note ? ?Patient Details ?Name: Reginald Ayers ?MRN: [id number] ?DOB: Nov 02, 1950 ? ? ?Cancelled treatment:       Reason Eval/Treat Not Completed: Other (comment);Patient at procedure or test/unavailable (pt npo today for vascular surgery, will continue efforts) ?Rolena Infante, MS CCC SLP ?Acute Rehab Services ?Office [phone] ?Pager [phone] ? ? ? ?[name] ?12/26/2021, 7:41 AM ?

## 2021-12-26 NOTE — Anesthesia Procedure Notes (Signed)
Arterial Line Insertion ?Start/End5/12/2021 1:00 PM, 12/26/2021 1:05 PM ?Performed by: Shelton Silvas, MD, anesthesiologist ? Patient location: Pre-op. ?Preanesthetic checklist: patient identified, IV checked, site marked, risks and benefits discussed, surgical consent, monitors and equipment checked, pre-op evaluation, timeout performed and anesthesia consent ?Lidocaine 1% used for infiltration ?Right, radial was placed ?Catheter size: 20 G ?Hand hygiene performed  and maximum sterile barriers used  ? ?Attempts: 3 ?Procedure performed without using ultrasound guided technique. ?Following insertion, dressing applied and Biopatch. ?Post procedure assessment: normal and unchanged ? ?Post procedure complications: second provider assisted. ?Patient tolerated the procedure well with no immediate complications. ?Additional procedure comments: MDA x1. ? ? ? ?

## 2021-12-26 NOTE — Progress Notes (Signed)
ANTICOAGULATION CONSULT NOTE- follow-up ?Pharmacy Consult for heparin ?Indication: atrial fibrillation ?Brief A/P: Heparin level is slightly supratherapeutic ? ?Allergies  ?Allergen Reactions  ? Actos [Pioglitazone] Shortness Of Breath  ?  Leg swelling   ? Atorvastatin   ?  Other reaction(s): leg pain  ? Lisinopril Cough  ? ? ?Patient Measurements: ?Height: 5\' 3"  (160 cm) ?Weight: 55.5 kg (122 lb 5.7 oz) ?IBW/kg (Calculated) : 56.9 ?Heparin Dosing Weight: 51.2 kg ? ?Vital Signs: ?Temp: 99 ?F (37.2 ?C) (05/05 0300) ?Temp Source: Oral (05/05 0300) ?BP: 137/67 (05/05 0300) ?Pulse Rate: 94 (05/05 0300) ? ?Labs: ?Recent Labs  ?  12/24/21 ?0220 12/24/21 ?1644 12/24/21 ?1801 12/25/21 ?0202 12/25/21 ?1149 12/25/21 ?1848 12/26/21 ?[id number]  ?HGB 8.4*  --   --  9.8*  --   --   --   ?HCT 26.3*  --   --  30.7*  --   --   --   ?PLT 207  --   --  207  --   --   --   ?HEPARINUNFRC 0.28*  --    < > 0.22* 0.46 0.52 0.52  ?CREATININE 1.60* 1.53*  --  1.52*  --   --   --   ? < > = values in this interval not displayed.  ? ? ?Estimated Creatinine Clearance: 34.5 mL/min (A) (by C-G formula based on SCr of 1.52 mg/dL (H)). ? ?Assessment: ?71 y.o. male with Afib and new CVA for heparin. Infusion recently increased to 1250 units/hr (therapeutic level earlier today). Team considering eliquis post TCAR. ? ?Heparin level back this morning remains at 0.52 which is just above therapeutic limit for our neuro heparin protocol. H/H stable. No issues with heparin infusion or s/s of bleeding per RN. ? ?Goal of Therapy:  ?Heparin level 0.3-0.5 units/ml ?Monitor platelets by anticoagulation protocol: Yes ?  ?Plan:  ?Slightly decrease heparin to 1150 units/hr ?Check heparin level at 1200 ?Monitor for s/s of hemorrhage ?F/u plan for transition to eliquis ? ?Nael Petrosyan BS, PharmD, BCPS ?Clinical Pharmacist ?12/26/2021 7:13 AM ? ?Contact: [phone] after 3 PM ? ?"Be curious, not judgmental..." -Jamal Maes ? ?

## 2021-12-26 NOTE — Progress Notes (Signed)
Inpatient Diabetes Program Recommendations ? ?AACE/ADA: New Consensus Statement on Inpatient Glycemic Control (2015) ? ?Target Ranges:  Prepandial:   less than 140 mg/dL ?     Peak postprandial:   less than 180 mg/dL (1-2 hours) ?     Critically ill patients:  140 - 180 mg/dL  ? ?Lab Results  ?Component Value Date  ? GLUCAP 202 (H) 12/26/2021  ? HGBA1C 8.1 (H) 12/21/2021  ? ? ?Review of Glycemic Control ? Latest Reference Range & Units 12/25/21 15:49 12/25/21 19:35 12/25/21 23:26 12/26/21 03:20 12/26/21 08:06 12/26/21 11:22  ?Glucose-Capillary 70 - 99 mg/dL 209 (H) 470 (H) 962 (H) 368 (H) 344 (H) 202 (H)  ?(H): Data is abnormally high ? ?Diabetes history: DM ?Outpatient Diabetes medications: Jardiance 25 mg QD, Metformin 1000 mg BID ?Current orders for Inpatient glycemic control: Novolog 0-20 units TID and 0-5 units QHS, Novolog 3 units TID ? ?Inpatient Diabetes Program Recommendations:   ? ?Semglee 5 units QD (0.1 units/kg) ? ?Will continue to follow while inpatient. ? ?Thank you, ?Dulce Sellar, MSN, RN ?Diabetes Coordinator ?Inpatient Diabetes Program ?[phone] (team pager from 8a-5p) ? ? ?

## 2021-12-26 NOTE — Progress Notes (Signed)
Following extubation from a right TCAR, the patient was not moving his left side.  A code stroke was called.  He was taken to CT scan.  His carotid artery appeared to be widely patent.  He was ultimately taken to the ICU for further management.  I did speak with family and updated them. ? ?[name] ?

## 2021-12-26 NOTE — Progress Notes (Addendum)
STROKE TEAM PROGRESS NOTE  ? ?INTERVAL HISTORY ?Received code stroke call for him due to left hemiparesis, left HH, right gaze after the procedure. Pt was induced at 13:40 for right TCAR. Post procedure, he was found to have left sided weakness, left hemianopia and right gaze, lethargic and not answer questions. Code stroke called. Stat CT no bleeding and stat CTA head and neck no LVO and right carotid stent patent. Concerning for right brain small infarct, will do MRI. Pt will stay in ICU for close monitoring.  ? ?Pt not a tPA candidate given recent stroke and today's surgery with carotid artery puncture in a site with difficulty to compress. Not IR candidate given no LVO.  ? ?Vitals:  ? 12/26/21 0726 12/26/21 0807 12/26/21 1149 12/26/21 1523  ?BP:  134/70 (!) 148/67 105/73  ?Pulse:  96 95 94  ?Resp:  20 18 16   ?Temp:  (!) 97.5 ?F (36.4 ?C) 98.6 ?F (37 ?C)   ?TempSrc:  Oral Oral   ?SpO2: 92% 93% 93% 97%  ?Weight:   48.5 kg   ?Height:   5\' 1"  (1.549 m)   ? ?CBC:  ?Recent Labs  ?Lab 12/20/21 ?1116 12/20/21 ?1127 12/23/21 ?0459 12/24/21 ?0220 12/25/21 ?0202  ?WBC 18.8*   < > 11.2* 8.7 8.8  ?NEUTROABS 16.6*  --  9.9*  --   --   ?HGB 10.4*   < > 9.0* 8.4* 9.8*  ?HCT 33.5*   < > 26.8* 26.3* 30.7*  ?MCV 95.7   < > 90.8 95.3 93.6  ?PLT 284   < > 211 207 207  ? < > = values in this interval not displayed.  ? ?Basic Metabolic Panel:  ?Recent Labs  ?Lab 12/24/21 ?0220 12/24/21 ?1644 12/25/21 ?0202 12/26/21 ?[id number]  ?NA 153*   < > 143 145  ?K 4.0   < > 3.2* 4.4  ?CL 118*   < > 107 111  ?CO2 23   < > 22 23  ?GLUCOSE 169*   < > 224* 353*  ?BUN 40*   < > 31* 34*  ?CREATININE 1.60*   < > 1.52* 1.70*  ?CALCIUM 7.9*   < > 8.1* 8.5*  ?MG 1.8  --  2.1 2.1  ?PHOS 3.1  --  2.4*  --   ? < > = values in this interval not displayed.  ? ?Lipid Panel:  ?Recent Labs  ?Lab 12/21/21 ?1240 12/24/21 ?0220  ?CHOL 85  --   ?TRIG 160* 117  ?HDL 32*  --   ?CHOLHDL 2.7  --   ?VLDL 32  --   ?[place] 21  --   ? ?HgbA1c:  ?Recent Labs  ?Lab 12/21/21 ?2246   ?HGBA1C 8.1*  ? ?Urine Drug Screen: No results for input(s): LABOPIA, COCAINSCRNUR, LABBENZ, AMPHETMU, THCU, LABBARB in the last 168 hours.  ?Alcohol Level No results for input(s): ETH in the last 168 hours. ? ?IMAGING past 24 hours ?CT HEAD CODE STROKE WO CONTRAST` ? ?Result Date: 12/26/2021 ?CLINICAL DATA:  Code stroke. Provided history: Post procedure, possible stroke. EXAM: CT HEAD WITHOUT CONTRAST TECHNIQUE: Contiguous axial images were obtained from the base of the skull through the vertex without intravenous contrast. RADIATION DOSE REDUCTION: This exam was performed according to the departmental dose-optimization program which includes automated exposure control, adjustment of the mA and/or kV according to patient size and/or use of iterative reconstruction technique. COMPARISON:  Noncontrast head CT and CT angiogram head/neck 12/25/2021. Brain MRI 12/21/2021. FINDINGS: The examination is moderately motion  degraded, limiting evaluation. Brain: Mild generalized parenchymal atrophy. A known tiny subacute infarct within the posterior left temporal lobe white matter was better appreciated on the recent prior brain MRI of 12/21/2021. Known small chronic cortical infarcts within the left frontal lobe. Background mild patchy and ill-defined hypoattenuation within the cerebral white matter, nonspecific but compatible with chronic small vessel ischemic disease. Small chronic infarct within the right cerebellar hemisphere, better appreciated on the prior MRI. Within the limitations of motion degradation, no acute intracranial hemorrhage, acute demarcated cortical infarct, extra-axial fluid collection or intracranial mass is identified. No midline shift. Vascular: No hyperdense vessel.  Atherosclerotic calcifications. Skull: No acute fracture or aggressive osseous lesion. Nonspecific chronic thinning of the bilateral parietal calvarium. Sinuses/Orbits: No mass or acute finding within the imaged orbits. Postsurgical  appearance of the paranasal sinuses. Mild mucosal thickening within the paranasal sinuses. Additionally, small-volume frothy secretions are present within the bilateral sphenoid and maxillary sinuses. Other: Bilateral mastoid effusions. ASPECTS [facility]([place] Stroke Program Early CT Score) - Ganglionic level infarction (caudate, lentiform nuclei, internal capsule, insula, M1-M3 cortex): 7 - Supraganglionic infarction (M4-M6 cortex): 3 Total score (0-10 with 10 being normal): 10 No acute intracranial abnormality identified. These results were communicated to Dr. Roda ShuttersXu At 3:48 pmon 12/26/2021 by text page via the [facility] messaging system. IMPRESSION: Moderately motion degraded exam, limiting evaluation. A known tiny subacute infarct within the posterior left temporal lobe white matter was better appreciated on the recent prior brain MRI of 12/21/2021. Within the limitations of motion degradation, no interval acute intracranial abnormality is identified. Parenchymal atrophy, chronic small vessel ischemic disease and chronic infarcts as described. Paranasal sinus disease. Correlate for acute on chronic sinusitis. Bilateral mastoid effusions. Electronically Signed   By: Jackey LogeKyle  Golden D.O.   On: 12/26/2021 15:49  ? ?Structural Heart Procedure ? ?Result Date: 12/26/2021 ?See surgical note for result. ? ?HYBRID OR IMAGING ([facility] ONLY) ? ?Result Date: 12/26/2021 ?There is no interpretation for this exam.  This order is for images obtained during a surgical procedure.  Please See "Surgeries" Tab for more information regarding the procedure.   ? ?PHYSICAL EXAM ? ?Temp:  [97.5 ?F (36.4 ?C)-99 ?F (37.2 ?C)] 98.6 ?F (37 ?C) (05/05 1149) ?Pulse Rate:  [94-117] 94 (05/05 1523) ?Resp:  [16-33] 16 (05/05 1523) ?BP: (105-148)/(57-74) 105/73 (05/05 1523) ?SpO2:  [92 %-100 %] 97 % (05/05 1523) ?Arterial Line BP: (133)/(41) 133/41 (05/05 1523) ?FiO2 (%):  [32 %] 32 % (05/05 0726) ?Weight:  [48.5 kg] 48.5 kg (05/05 1149) ? ?General - thin, well developed,  lethargic but more awake after CT test. ? ?Ophthalmologic - fundi not visualized due to noncooperation. ? ?Cardiovascular - Regular rhythm and rate. ? ?Neuro - lethargic but able to open eyes on voice, more awake alert and kept eyes open towards the end of CT study, not able to answer any questions but following some simple commands in a delayed fashion. Right gaze preference did not cross midline, not blinking to visual threat on the left, inconsistent blinking to visual threat on the right, PERRL. Slight left nasolabial fold flattening. Tongue protrusion not cooperative. RUE hold against gravity with slight drift but not hit bed within 10 sec. RLE strong withdraw to pain. LUE and LLE mild withdraw to pain, not against gravity. Sensation, coordination not cooperative and gait not tested. ? ? ?ASSESSMENT/PLAN ?Mr. Bertram GalaWilliam H Javid is a 71 y.o. male with history of bilateral carotid artery stenosis (total occlusion of the left, moderate stenosis of the right), hypertension, hyperlipidemia,  prior tobacco abuse, diabetes, coronary artery disease s/p CABG, peripheral vascular disease s/p stents, COPD presenting with generalized weakness over the past 3 weeks.  On admission 4/29, he was found to be septic secondary to a community-acquired pneumonia, he was intubated and started on antibiotics.  He developed A-fib with RVR, became hypotensive and required vasopressors.  He has since converted back into normal sinus rhythm and is no longer requiring pressors. CT no acute finding.  MRI showed left temporal lobe white matter small infarct. Awaiting MRA, carotid duplex shows Rt ICA 80-99% occlusion and Lt ICA chronically occluded.  ? ?Right MCA syndrome post right TCAR ?Presented with left hemiparesis, left HH, right gaze, lethargy, nonverbal ?CT stat no acute finding ?CTA head and neck no LVO ?Pt not a tPA candidate given recent stroke and today's surgery with carotid artery puncture in a site difficult to compress. Not IR  candidate given no LVO.  ?Concerning for small right MCA infarct vs. right brain hypoperfusion ?No HA or SAH to indicate hyperperfusion ?MRI pending ?ICU monitoring overnight.  ? ?Stroke: Punctate left temporal

## 2021-12-26 NOTE — Anesthesia Procedure Notes (Signed)
Procedure Name: Intubation ?Date/Time: 12/26/2021 1:48 PM ?Performed by: Georgia Duff, CRNA ?Pre-anesthesia Checklist: Patient identified, Emergency Drugs available, Suction available and Patient being monitored ?Patient Re-evaluated:Patient Re-evaluated prior to induction ?Oxygen Delivery Method: Circle System Utilized ?Preoxygenation: Pre-oxygenation with 100% oxygen ?Induction Type: IV induction ?Ventilation: Mask ventilation without difficulty ?Laryngoscope Size: Sabra Heck and 2 ?Grade View: Grade I ?Tube type: Oral ?Tube size: 7.5 mm ?Number of attempts: 1 ?Airway Equipment and Method: Stylet and Oral airway ?Placement Confirmation: ETT inserted through vocal cords under direct vision, positive ETCO2 and breath sounds checked- equal and bilateral ?Secured at: 21 cm ?Tube secured with: Tape ?Dental Injury: Teeth and Oropharynx as per pre-operative assessment  ? ? ? ? ?

## 2021-12-26 NOTE — TOC Benefit Eligibility Note (Signed)
Patient Advocate Encounter ? ?Insurance verification completed.   ? ?The patient is currently admitted and upon discharge could be taking Eliquis 2.5 mg. ? ?The current 30 day co-pay is, $50.00.  ? ?The patient is insured through [employer]  ? ? ? ?Roland Earl, CPhT ?Pharmacy Patient Advocate Specialist ?[facility] Patient Advocate Team ?Direct Number: [phone]  Fax: [phone] ? ? ? ? ? ?  ?

## 2021-12-26 NOTE — Interval H&P Note (Signed)
History and Physical Interval Note: ? ?12/26/2021 ?10:53 AM ? ?Reginald Ayers  has presented today for surgery, with the diagnosis of Right carotid artery stenosis.  The various methods of treatment have been discussed with the patient and family. After consideration of risks, benefits and other options for treatment, the patient has consented to  Procedure(s): ?Right Transcarotid Artery Revascularization (Right) as a surgical intervention.  The patient's history has been reviewed, patient examined, no change in status, stable for surgery.  I have reviewed the patient's chart and labs.  Questions were answered to the patient's satisfaction.   ? ? ?Reginald Ayers ? ? ?

## 2021-12-26 NOTE — Discharge Instructions (Addendum)
Follow with Primary MD Seward Carol, MD in 7 days  ? ?Get CBC, CMP, 2 view Chest X ray -  checked next visit within 1 week by Primary MD   ? ?Activity: As tolerated with Full fall precautions use walker/cane & assistance as needed ? ?Disposition Home  ? ?Diet: Dysphagia 1 diet - pureed with feeding assistance and aspiration precautions. ? ?Accuchecks 4 times/day, Once in AM empty stomach and then before each meal. ?Log in all results and show them to your Prim.MD in 3 days. ?If any glucose reading is under 80 or above 300 call your Prim MD immidiately. ?Follow Low glucose instructions for glucose under 80 as instructed. ? ?Special Instructions: If you have smoked or chewed Tobacco  in the last 2 yrs please stop smoking, stop any regular Alcohol  and or any Recreational drug use. ? ?On your next visit with your primary care physician please Get Medicines reviewed and adjusted. ? ?Please request your Prim.MD to go over all Hospital Tests and Procedure/Radiological results at the follow up, please get all Hospital records sent to your Prim MD by signing hospital release before you go home. ? ?If you experience worsening of your admission symptoms, develop shortness of breath, life threatening emergency, suicidal or homicidal thoughts you must seek medical attention immediately by calling 911 or calling your MD immediately  if symptoms less severe. ? ?You Must read complete instructions/literature along with all the possible adverse reactions/side effects for all the Medicines you take and that have been prescribed to you. Take any new Medicines after you have completely understood and accpet all the possible adverse reactions/side effects.  ? ?  ? ?Information on my medicine - ELIQUIS? (apixaban) ? ?This medication education was reviewed with me or my healthcare representative as part of my discharge preparation.  ? ?Why was Eliquis? prescribed for you? ?Eliquis? was prescribed for you to reduce the risk of a blood  clot forming that can cause a stroke if you have a medical condition called atrial fibrillation (a type of irregular heartbeat). ? ?What do You need to know about Eliquis? ? ?Take your Eliquis? TWICE DAILY - one tablet in the morning and one tablet in the evening with or without food. If you have difficulty swallowing the tablet whole please discuss with your pharmacist how to take the medication safely. ? ?Take Eliquis? exactly as prescribed by your doctor and DO NOT stop taking Eliquis? without talking to the doctor who prescribed the medication.  Stopping may increase your risk of developing a stroke.  Refill your prescription before you run out. ? ?After discharge, you should have regular check-up appointments with your healthcare provider that is prescribing your Eliquis?.  In the future your dose may need to be changed if your kidney function or weight changes by a significant amount or as you get older. ? ?What do you do if you miss a dose? ?If you miss a dose, take it as soon as you remember on the same day and resume taking twice daily.  Do not take more than one dose of ELIQUIS at the same time to make up a missed dose. ? ?Important Safety Information ?A possible side effect of Eliquis? is bleeding. You should call your healthcare provider right away if you experience any of the following: ?Bleeding from an injury or your nose that does not stop. ?Unusual colored urine (red or dark brown) or unusual colored stools (red or black). ?Unusual bruising for unknown reasons. ?A serious  fall or if you hit your head (even if there is no bleeding). ? ?Some medicines may interact with Eliquis? and might increase your risk of bleeding or clotting while on Eliquis?Marland Kitchen To help avoid this, consult your healthcare provider or pharmacist prior to using any new prescription or non-prescription medications, including herbals, vitamins, non-steroidal anti-inflammatory drugs (NSAIDs) and supplements. ? ?This website has more  information on Eliquis? (apixaban): http://www.eliquis.com/eliquis/home ? ? ? ?[facility] ? ?Discharge Instructions ?  ?Carotid Surgery ? ?Please refer to the following instructions for your post-procedure care. Your surgeon or physician assistant will discuss any changes with you. ? ?Activity ? ?You are encouraged to walk as much as you can. You can slowly return to normal activities but must avoid strenuous activity and heavy lifting until your doctor tell you it's okay. Avoid activities such as vacuuming or swinging a golf club. You can drive after one week if you are comfortable and you are no longer taking prescription pain medications. It is normal to feel tired for serval weeks after your surgery. It is also normal to have difficulty with sleep habits, eating, and bowel movements after surgery. These will go away with time. ? ?Bathing/Showering ? ?Shower daily after you go home. Do not soak in a bathtub, hot tub, or swim until the incision heals completely. ? ?Incision Care ? ?Shower every day. Clean your incision with mild soap and water. Pat the area dry with a clean towel. You do not need a bandage unless otherwise instructed. Do not apply any ointments or creams to your incision. You may have skin glue on your incision. Do not peel it off. It will come off on its own in about one week. Your incision may feel thickened and raised for several weeks after your surgery. This is normal and the skin will soften over time.  ? ?For Men Only: It's okay to shave around the incision but do not shave the incision itself for 2 weeks. It is common to have numbness under your chin that could last for several months. ? ?Diet ? ?Resume your normal diet. There are no special food restrictions following this procedure. A low fat/low cholesterol diet is recommended for all patients with vascular disease. In order to heal from your surgery, it is CRITICAL to get adequate nutrition. Your body  requires vitamins, minerals, and protein. Vegetables are the best source of vitamins and minerals. Vegetables also provide the perfect balance of protein. Processed food has little nutritional value, so try to avoid this. ? ?Medications ? ?Resume taking all of your medications unless your doctor or physician assistant tells you not to. If your incision is causing pain, you may take over-the- counter pain relievers such as acetaminophen (Tylenol). If you were prescribed a stronger pain medication, please be aware these medications can cause nausea and constipation. Prevent nausea by taking the medication with a snack or meal. Avoid constipation by drinking plenty of fluids and eating foods with a high amount of fiber, such as fruits, vegetables, and grains.  ?Do not take Tylenol if you are taking prescription pain medications. ? ?Follow Up ? ?Our office will schedule a follow up appointment 2-3 weeks following discharge. ? ?Please call us immediately for any of the following conditions ? ?Increased pain, redness, drainage (pus) from your incision site. ?Fever of 101 degrees or higher. ?If you should develop stroke (slurred speech, difficulty swallowing, weakness on one side of your body, loss of vision) you should call  911 and go to the nearest emergency room. ? ?Reduce your risk of vascular disease: ? ?Stop smoking. If you would like help call [facility] at 1-800-QUIT-NOW [phone]) or [place] at [phone]. ?Manage your cholesterol ?Maintain a desired weight ?Control your diabetes ?Keep your blood pressure down ? ?If you have any questions, please call the office at [phone]. ? ?

## 2021-12-26 NOTE — Progress Notes (Signed)
ANTICOAGULATION CONSULT NOTE- follow-up ?Pharmacy Consult for heparin ?Indication: atrial fibrillation ? ?Allergies  ?Allergen Reactions  ? Actos [Pioglitazone] Shortness Of Breath  ?  Leg swelling   ? Atorvastatin   ?  Other reaction(s): leg pain  ? Lisinopril Cough  ? ? ?Patient Measurements: ?Height: 5\' 1"  (154.9 cm) ?Weight: 48.5 kg (107 lb) ?IBW/kg (Calculated) : 52.3 ?Heparin Dosing Weight: 51.2 kg ? ?Vital Signs: ?Temp: 99.8 ?F (37.7 ?C) (05/05 1937) ?Temp Source: Axillary (05/05 1937) ?BP: 161/54 (05/05 2026) ?Pulse Rate: 101 (05/05 2026) ? ?Labs: ?Recent Labs  ?  12/24/21 ?0220 12/24/21 ?1644 12/24/21 ?1801 12/25/21 ?0202 12/25/21 ?1149 12/25/21 ?1848 12/26/21 ?[id number]  ?HGB 8.4*  --   --  9.8*  --   --   --   ?HCT 26.3*  --   --  30.7*  --   --   --   ?PLT 207  --   --  207  --   --   --   ?HEPARINUNFRC 0.28*  --    < > 0.22* 0.46 0.52 0.52  ?CREATININE 1.60* 1.53*  --  1.52*  --   --  1.70*  ? < > = values in this interval not displayed.  ? ? ?Estimated Creatinine Clearance: 26.9 mL/min (A) (by C-G formula based on SCr of 1.7 mg/dL (H)). ? ?Assessment: ?71 y.o. male with Afib and new CVA for heparin. Pt s/p TCAR on 5/5. S/p procedure code stroke was called and no bleeding was visualized on head CT.  Neuro consulted and is c/f small R MCA infarct vs. right brain hypoperfusion. No TNK given and pt not an IR candidate. MRI pending. ? ?Heparin infusion was discontinued at 12:17 for TCAR. ?Heparin infusion resumed post-TCAR at 20:02 at previously therapeutic rate of 1150 units/hr.  ?No issues with heparin infusion or s/s of bleeding per RN. ? ?Goal of Therapy:  ?Heparin level 0.3-0.5 units/ml ?Monitor platelets by anticoagulation protocol: Yes ?  ?Plan:  ?Given complications s/p TCAR, continue heparin at 1150 units/hr ?Check heparin level in 8 hours ?Daily CBC  ?Monitor for s/s of hemorrhage ?F/u plan for transition to eliquis ? ?Luisa Hart, PharmD, BCPS ?Clinical Pharmacist ?12/26/2021 8:48 PM  ? ?Please refer  to [facility] for pharmacy phone number  ? ?

## 2021-12-26 NOTE — Progress Notes (Addendum)
HOSPITAL MEDICINE OVERNIGHT EVENT NOTE   ? ?Notified by nursing that patient has not passed his swallow screen and therefore will be kept n.p.o. overnight until SLP evaluation tomorrow. ? ?This includes patient's evening dose of oral metoprolol.  It is currently exhibiting blood pressures of between 123456 and 0000000 systolic which is essentially at target.  Patient is currently rate controlled and in normal sinus rhythm. ? ?Continue to monitor closely overnight. ? ?Reginald Emerald  MD ?[facility]  ? ?ADDENDUM 5/6 3am ? ?Patient now beginning to exhibit elevated blood pressures beyond the range of between 123456 and 0000000 systolic mention in neurology notes. ? ?As needed intravenous labetalol to help patient achieve parameters.   ? ?Reginald Ayers ? ?ADDENDUM 5:40am ? ?Notified by nursing the patient's hemoglobin is 7.0, down from 9.8 on 5/4. ? ?Nursing notes no clinical evidence of bleeding.  Patient remains hemodynamically stable. ? ?This is particularly concerning as patient is currently on heparin infusion.  We will obtain a repeat CBC later this morning as well as obtaining a type and screen.  If hemoglobin continues to downtrend below 7 patient will likely need a blood transfusion. ? ?Reginald Ayers ? ? ? ? ? ? ? ? ? ? ?

## 2021-12-26 NOTE — Progress Notes (Signed)
?      ?                 PROGRESS NOTE ? ?      ?PATIENT DETAILS ?Name: Reginald Ayers ?Age: 71 y.o. ?Sex: male ?Date of Birth: 07/17/1951 ?Admit Date: 12/20/2021 ?Admitting Physician Ravi Agarwala, MD ?PCP:Polite, Ronald, MD ? ?Brief Summary: ?Patient is a 71 y.o.  male with history of PAD, CAD s/p CABG, HTN, HLD, COPD-who presented with weakness/shortness of breath-he was found to have acute hypoxic respiratory failure due to multifocal pneumonia-was intubated in the emergency room and subsequently admitted to the ICU by PCCM.  Further hospital course was complicated by acute CVA, A-fib RVR.  Upon stability-he was transferred to [facility]. ? ? ?Significant events: ?4/29>>Intubated and admitted to the ICU. >> A fib and elevated troponins ?4/30>> MRI with acute CVA. ?5/02>> extubated  ?5/04>> transfer to [facility]. ? ?Significant studies: ?4/29>> CT head: No acute intracranial abnormality. ?4/29>> CXR: Right lower lobe airspace consolidation. ?4/29>> CT chest/abdomen/pelvis: Multifocal PNA ?4/30>> A1c: 8.1 ?4/30>> LDL: 21 LDL of 20 ?4/30>> MRI brain: Acute ischemia left temporal lobe, left ICA occlusion. ?5/01>> Echo: EF 50-55%, dyskinesis of left ventricular apical segment. ?5/04>> CTA neck:> 80% stenosis of right ICA, left ICA occluded. ?5/04>> CTA head: Left ICA occluded at the precavernous/cavernous segments-reconstitution within the paraclinoid and supraclinoid ICA.  No other large LVO. ? ?Significant microbiology data: ?4/29>> COVID/influenza PCR: Negative ?4/29>> blood culture: No growth ?4/30>> trach aspirate: Normal respiratory flora ? ?Procedures: ?ETT: 4/29>> 5/2 ? ?Consults: ?PCCM, neurology, vascular surgery ? ?Subjective: ?Lying comfortably in bed-denies any chest pain or shortness of breath. ? ?Objective: ?Vitals: ?Blood pressure 105/73, pulse 95, temperature 98.6 ?F (37 ?C), temperature source Oral, resp. rate (!) 28, height 5' 1" (1.549 m), weight 48.5 kg, SpO2 97 %.  ? ?Exam: ?No major issues overnight-was  awake and alert.  For TCAR today. ? ?Pertinent Labs/Radiology: ? ?  Latest Ref Rng & Units 12/25/2021  ?  2:02 AM 12/24/2021  ?  2:20 AM 12/23/2021  ?  4:59 AM  ?CBC  ?WBC 4.0 - 10.5 K/uL 8.8   8.7   11.2    ?Hemoglobin 13.0 - 17.0 g/dL 9.8   8.4   9.0    ?Hematocrit 39.0 - 52.0 % 30.7   26.3   26.8    ?Platelets 150 - 400 K/uL 207   207   211    ?  ?Lab Results  ?Component Value Date  ? NA 145 12/26/2021  ? K 4.4 12/26/2021  ? CL 111 12/26/2021  ? CO2 23 12/26/2021  ? ?  ? ?Assessment/Plan: ?Severe sepsis due to PNA ?Acute hypoxic respiratory failure ?Overall improved-on just 2 L of oxygen.  Completed a course of antibiotics-all cultures negative to date. ? ?PAF with RVR ?Maintaining sinus rhythm-A-fib likely triggered by PNA/hypoxemia.  Echo stable.  Recent TSH February stable.  On IV heparin ? ?Acute CVA ?Unclear whether this is a embolic etiology from A-fib or from hypoperfusion in the setting of left ICA occlusion.  Work-up as above-on IV heparin/Plavix-neurology following. ? ?Left ICA occlusion ?More than 80% stenosis of right ICA ?Vascular surgery following-TCAR tentatively scheduled for later today.  Appreciate cardiology preop risk assessment. ? ?Acute metabolic encephalopathy: ?2/2 sepsis/hypoxia-improved-relatively awake and alert ? ?AKI on CKD stage 3a: ? Likely hemodynamically mediated-and approaching usual baseline-slowly improving-continue to follow. ? ?Elevated troponins: ?Likely demand ischemia-echo with stable EF ? ?History of CAD s/p CABG 2010 ?On antiplatelet/beta-blocker-previously not   on statin due to myalgias-being trialed on Crestor (myalgias was to Lipitor) ? ?History of PAD s/p left common iliac/right SFA stenting ?Continue antiplatelets ? ?Hypokalemia:  ?Repleted. ? ?DM-2:  ?Continue SSI ? ?Recent Labs  ?  12/26/21 ?0806 12/26/21 ?1122 12/26/21 ?1523  ?GLUCAP 344* 202* 120*  ? ?  ? ?COPD ?Continue bronchodilators-not in exacerbation ? ?Nutrition Status: ?Nutrition Problem: Severe  Malnutrition ?Etiology: chronic illness (COPD, CAD) ?Signs/Symptoms: severe muscle depletion, severe fat depletion, percent weight loss (13% weight loss within 3 months) ?Percent weight loss: 13 % ?Interventions: Hormel Shake, MVI ? ?Pressure Ulcer: ?Pressure Injury 12/20/21 Buttocks Right;Left Stage 1 -  Intact skin with non-blanchable redness of a localized area usually over a bony prominence. wound is 0.5 cm lenght  by 0.6 cm width (Active)  ?12/20/21 1831  ?Location: Buttocks  ?Location Orientation: Right;Left  ?Staging: Stage 1 -  Intact skin with non-blanchable redness of a localized area usually over a bony prominence.  ?Wound Description (Comments): wound is 0.5 cm lenght  by 0.6 cm width  ?Present on Admission: Yes  ?Dressing Type Foam - Lift dressing to assess site every shift 12/26/21 0800  ? ? ?BMI: ?Estimated body mass index is 20.22 kg/m? as calculated from the following: ?  Height as of this encounter: 5' 1" (1.549 m). ?  Weight as of this encounter: 48.5 kg.  ? ?Code status: ?  Code Status: Full Code  ? ?DVT Prophylaxis: ? IV heparin ? ?Family Communication: None at bedside ? ? ?Disposition Plan: ?Status is: Inpatient ?Remains inpatient appropriate because: Resolving sepsis/hypoxemia-CVA-new onset A-fib-carotid revascularization planned by vascular surgery tomorrow. ?  ?Planned Discharge Destination:Skilled nursing facility ? ? ?Diet: ?Diet Order   ? ?       ?  Diet NPO time specified Except for: Sips with Meds  Diet effective midnight       ?  ? ?  ?  ? ?  ?  ? ? ?Antimicrobial agents: ?Anti-infectives (From admission, onward)  ? ? Start     Dose/Rate Route Frequency Ordered Stop  ? 12/27/21 0600  ceFAZolin (ANCEF) IVPB 2g/100 mL premix       ? 2 g ?200 mL/hr over 30 Minutes Intravenous On call to O.R. 12/26/21 1218 12/26/21 1352  ? 12/21/21 1200  cefTRIAXone (ROCEPHIN) 2 g in sodium chloride 0.9 % 100 mL IVPB       ? 2 g ?200 mL/hr over 30 Minutes Intravenous Every 24 hours 12/20/21 1444 12/24/21  1430  ? 12/20/21 1500  cefTRIAXone (ROCEPHIN) 1 g in sodium chloride 0.9 % 100 mL IVPB       ?Note to Pharmacy: Was supposed to be given 2 g but ED is order set only gives 1 g  ? 1 g ?200 mL/hr over 30 Minutes Intravenous  Once 12/20/21 1443 12/20/21 2112  ? 12/20/21 1415  cefTRIAXone (ROCEPHIN) 1 g in sodium chloride 0.9 % 100 mL IVPB       ? 1 g ?200 mL/hr over 30 Minutes Intravenous  Once 12/20/21 1409 12/20/21 1556  ? 12/20/21 1415  azithromycin (ZITHROMAX) 500 mg in sodium chloride 0.9 % 250 mL IVPB  Status:  Discontinued       ? 500 mg ?250 mL/hr over 60 Minutes Intravenous Every 24 hours 12/20/21 1409 12/23/21 1214  ? ?  ? ? ? ?MEDICATIONS: ?Scheduled Meds: ? [MAR Hold] arformoterol  15 mcg Nebulization BID  ? [MAR Hold] aspirin EC  81 mg Oral Daily  ? [facility]  Hold] budesonide (PULMICORT) nebulizer solution  0.25 mg Nebulization BID  ? [MAR Hold] chlorhexidine gluconate (MEDLINE KIT)  15 mL Mouth Rinse BID  ? [MAR Hold] Chlorhexidine Gluconate Cloth  6 each Topical Daily  ? [MAR Hold] clopidogrel  75 mg Oral Daily  ? [MAR Hold] docusate sodium  100 mg Oral BID  ? [MAR Hold] insulin aspart  0-20 Units Subcutaneous TID WC  ? [MAR Hold] insulin aspart  0-5 Units Subcutaneous QHS  ? [MAR Hold] insulin aspart  3 Units Subcutaneous TID WC  ? [MAR Hold] mouth rinse  15 mL Mouth Rinse QID  ? [MAR Hold] metoprolol tartrate  25 mg Oral BID  ? [MAR Hold] multivitamin with minerals  1 tablet Oral Daily  ? [MAR Hold] polyethylene glycol  17 g Oral Daily  ? [MAR Hold] revefenacin  175 mcg Nebulization Daily  ? [MAR Hold] rosuvastatin  20 mg Oral Daily  ? [MAR Hold] sodium chloride flush  3 mL Intravenous Q12H  ? ?Continuous Infusions: ? sodium chloride 10 mL/hr at 12/24/21 2300  ? feeding supplement (VITAL AF 1.2 CAL) 1,000 mL (12/23/21 0236)  ? heparin Stopped (12/26/21 1217)  ? lactated ringers 10 mL/hr at 12/26/21 1239  ? ?PRN Meds:.[MAR Hold] acetaminophen **OR** [MAR Hold] acetaminophen, [MAR Hold] albuterol, insulin  aspart, [MAR Hold] ondansetron **OR** [MAR Hold] ondansetron (ZOFRAN) IV ? ? ?I have personally reviewed following labs and imaging studies ? ?LABORATORY DATA: ?CBC: ?Recent Labs  ?Lab 12/20/21 ?1116 12/20/21 ?1127 12/21/21

## 2021-12-27 DIAGNOSIS — I63132 Cerebral infarction due to embolism of left carotid artery: Secondary | ICD-10-CM | POA: Diagnosis not present

## 2021-12-27 DIAGNOSIS — J9601 Acute respiratory failure with hypoxia: Secondary | ICD-10-CM | POA: Diagnosis not present

## 2021-12-27 DIAGNOSIS — J189 Pneumonia, unspecified organism: Secondary | ICD-10-CM | POA: Diagnosis not present

## 2021-12-27 DIAGNOSIS — E1169 Type 2 diabetes mellitus with other specified complication: Secondary | ICD-10-CM | POA: Diagnosis not present

## 2021-12-27 LAB — BASIC METABOLIC PANEL
Anion gap: 13 (ref 5–15)
BUN: 29 mg/dL — ABNORMAL HIGH (ref 8–23)
CO2: 20 mmol/L — ABNORMAL LOW (ref 22–32)
Calcium: 8.3 mg/dL — ABNORMAL LOW (ref 8.9–10.3)
Chloride: 114 mmol/L — ABNORMAL HIGH (ref 98–111)
Creatinine, Ser: 1.62 mg/dL — ABNORMAL HIGH (ref 0.61–1.24)
GFR, Estimated: 45 mL/min — ABNORMAL LOW (ref >60)
Glucose, Bld: 207 mg/dL — ABNORMAL HIGH (ref 70–99)
Potassium: 4.1 mmol/L (ref 3.5–5.1)
Sodium: 147 mmol/L — ABNORMAL HIGH (ref 135–145)

## 2021-12-27 LAB — HEMOGLOBIN AND HEMATOCRIT, BLOOD
HCT: 28.5 % — ABNORMAL LOW (ref 39.0–52.0)
Hemoglobin: 9.3 g/dL — ABNORMAL LOW (ref 13.0–17.0)

## 2021-12-27 LAB — CBC
HCT: 22.9 % — ABNORMAL LOW (ref 39.0–52.0)
Hemoglobin: 7 g/dL — ABNORMAL LOW (ref 13.0–17.0)
MCH: 29.5 pg (ref 26.0–34.0)
MCHC: 30.6 g/dL (ref 30.0–36.0)
MCV: 96.6 fL (ref 80.0–100.0)
Platelets: 185 10*3/uL (ref 150–400)
RBC: 2.37 MIL/uL — ABNORMAL LOW (ref 4.22–5.81)
RDW: 14.6 % (ref 11.5–15.5)
WBC: 8.3 10*3/uL (ref 4.0–10.5)
nRBC: 0 % (ref 0.0–0.2)

## 2021-12-27 LAB — GLUCOSE, CAPILLARY
Glucose-Capillary: 194 mg/dL — ABNORMAL HIGH (ref 70–99)
Glucose-Capillary: 197 mg/dL — ABNORMAL HIGH (ref 70–99)
Glucose-Capillary: 322 mg/dL — ABNORMAL HIGH (ref 70–99)
Glucose-Capillary: 351 mg/dL — ABNORMAL HIGH (ref 70–99)
Glucose-Capillary: 198 mg/dL — ABNORMAL HIGH (ref 70–99)

## 2021-12-27 LAB — PREPARE RBC (CROSSMATCH)

## 2021-12-27 LAB — HEPARIN LEVEL (UNFRACTIONATED): Heparin Unfractionated: 0.32 IU/mL (ref 0.30–0.70)

## 2021-12-27 MED ORDER — LABETALOL HCL 5 MG/ML IV SOLN
10.0000 mg | INTRAVENOUS | Status: DC | PRN
Start: 2021-12-27 — End: 2021-12-27
  Administered 2021-12-27: 10 mg via INTRAVENOUS
  Filled 2021-12-27: qty 4

## 2021-12-27 MED ORDER — DIPHENHYDRAMINE HCL 50 MG/ML IJ SOLN
25.0000 mg | Freq: Once | INTRAMUSCULAR | Status: AC
  Administered 2021-12-27: 25 mg via INTRAVENOUS
  Filled 2021-12-27: qty 1

## 2021-12-27 MED ORDER — APIXABAN 5 MG PO TABS
5.0000 mg | ORAL_TABLET | Freq: Two times a day (BID) | ORAL | Status: DC
  Administered 2021-12-27 – 2021-12-28 (×2): 5 mg via ORAL
  Filled 2021-12-27 (×2): qty 1

## 2021-12-27 MED ORDER — LABETALOL HCL 5 MG/ML IV SOLN
10.0000 mg | INTRAVENOUS | Status: DC | PRN
  Administered 2021-12-27 (×3): 10 mg via INTRAVENOUS
  Filled 2021-12-27 (×2): qty 4

## 2021-12-27 MED ORDER — ACETAMINOPHEN 325 MG PO TABS
650.0000 mg | ORAL_TABLET | Freq: Once | ORAL | Status: AC
  Administered 2021-12-27: 650 mg via ORAL
  Filled 2021-12-27: qty 2

## 2021-12-27 MED ORDER — SODIUM CHLORIDE 0.9% IV SOLUTION: Freq: Once | INTRAVENOUS | Status: DC

## 2021-12-27 MED ORDER — FUROSEMIDE 10 MG/ML IJ SOLN
20.0000 mg | Freq: Once | INTRAMUSCULAR | Status: AC
  Administered 2021-12-27: 20 mg via INTRAVENOUS
  Filled 2021-12-27: qty 2

## 2021-12-27 NOTE — Progress Notes (Addendum)
? ? ?Subjective  - POD #1, status post right TCAR ? ?Says he feels great this morning.  Nothing feels like it is wrong. ?Postprocedure yesterday he was having left-sided hemiparesis.  This appears to have resolved ? ? ?Physical Exam: ? ?Good movement of the left upper and lower extremity.  He no longer has lateral gaze ? ? ?He squeezes both hands without difficulty.  He is moving both of his legs.  He continues to have some wheezing. ? ? ? ?Assessment/Plan:  POD #1 ? ?Neurologic  symptoms from yesterday appear to have completely resolved.  This must have been secondary to hypoperfusion and transient, given his prior neurologic issues.  His MRI was negative for stroke.  He should continue with aspirin and Plavix for at least 6 weeks in addition to anticoagulation for his atrial fibrillation.  At the 6-week mark, we can consider going to single agent antiplatelet therapy.  He also will continue his statin.  He has a swallow study scheduled for today.  He is still having some pulmonary congestion.  This will need to be resolved prior to discharge. ? ?Reginald Ayers ?12/27/2021 ?9:42 AM ?-- ? ?Vitals:  ? 12/27/21 0751 12/27/21 0810  ?BP: 138/66   ?Pulse: 100   ?Resp: 20   ?Temp: 98.8 ?F (37.1 ?C) 99.3 ?F (37.4 ?C)  ?SpO2: 97%   ? ? ?Intake/Output Summary (Last 24 hours) at 12/27/2021 0942 ?Last data filed at 12/27/2021 0700 ?Gross per 24 hour  ?Intake 1672.09 ml  ?Output 600 ml  ?Net 1072.09 ml  ? ? ? ?Laboratory ?CBC ?   ?Component Value Date/Time  ? WBC 8.3 12/27/2021 0429  ? HGB 7.0 (L) 12/27/2021 0429  ? HCT 22.9 (L) 12/27/2021 0429  ? PLT 185 12/27/2021 0429  ? ? ?BMET ?   ?Component Value Date/Time  ? NA 147 (H) 12/27/2021 0429  ? K 4.1 12/27/2021 0429  ? CL 114 (H) 12/27/2021 0429  ? CO2 20 (L) 12/27/2021 0429  ? GLUCOSE 207 (H) 12/27/2021 0429  ? BUN 29 (H) 12/27/2021 0429  ? CREATININE 1.62 (H) 12/27/2021 0429  ? CALCIUM 8.3 (L) 12/27/2021 0429  ? GFRNONAA 45 (L) 12/27/2021 0429  ? GFRAA >60 09/25/2017 2016   ? ? ?COAG ?Lab Results  ?Component Value Date  ? INR 1.5 10/26/2008  ? INR 1.1 10/24/2008  ? ?No results found for: PTT ? ?Antibiotics ?Anti-infectives (From admission, onward)  ? ? Start     Dose/Rate Route Frequency Ordered Stop  ? 12/27/21 0600  ceFAZolin (ANCEF) IVPB 2g/100 mL premix       ? 2 g ?200 mL/hr over 30 Minutes Intravenous On call to O.R. 12/26/21 1218 12/26/21 1352  ? 12/21/21 1200  cefTRIAXone (ROCEPHIN) 2 g in sodium chloride 0.9 % 100 mL IVPB       ? 2 g ?200 mL/hr over 30 Minutes Intravenous Every 24 hours 12/20/21 1444 12/24/21 1430  ? 12/20/21 1500  cefTRIAXone (ROCEPHIN) 1 g in sodium chloride 0.9 % 100 mL IVPB       ?Note to Pharmacy: Was supposed to be given 2 g but ED is order set only gives 1 g  ? 1 g ?200 mL/hr over 30 Minutes Intravenous  Once 12/20/21 1443 12/20/21 2112  ? 12/20/21 1415  cefTRIAXone (ROCEPHIN) 1 g in sodium chloride 0.9 % 100 mL IVPB       ? 1 g ?200 mL/hr over 30 Minutes Intravenous  Once 12/20/21 1409 12/20/21 1556  ? 12/20/21  1415  azithromycin (ZITHROMAX) 500 mg in sodium chloride 0.9 % 250 mL IVPB  Status:  Discontinued       ? 500 mg ?250 mL/hr over 60 Minutes Intravenous Every 24 hours 12/20/21 1409 12/23/21 1214  ? ?  ? ? ? ?V. Leia Alf, M.D., FACS ?[facility] ?Office: [phone] ?Pager:  [phone]  ?

## 2021-12-27 NOTE — Progress Notes (Addendum)
STROKE TEAM PROGRESS NOTE  ? ?INTERVAL HISTORY ?code stroke call  after the procedure yesterday. CT neg. MRI neg.  He has no complaints and doing well. No weakness.  ? ?Vitals:  ? 12/27/21 1200 12/27/21 1300 12/27/21 1400 12/27/21 1415  ?BP: (!) 144/60  106/72 (!) 115/51  ?Pulse: (!) 104   96  ?Resp: (!) 31 (!) 23 (!) 36 (!) 31  ?Temp: 97.9 ?F (36.6 ?C)   98 ?F (36.7 ?C)  ?TempSrc: Oral   Oral  ?SpO2: 91% 100% 91% 91%  ?Weight:      ?Height:      ? ?CBC:  ?Recent Labs  ?Lab 12/23/21 ?[id number] 12/24/21 ?0220 12/25/21 ?0202 12/27/21 ?[id number]  ?WBC 11.2*   < > 8.8 8.3  ?NEUTROABS 9.9*  --   --   --   ?HGB 9.0*   < > 9.8* 7.0*  ?HCT 26.8*   < > 30.7* 22.9*  ?MCV 90.8   < > 93.6 96.6  ?PLT 211   < > 207 185  ? < > = values in this interval not displayed.  ? ? ?Basic Metabolic Panel:  ?Recent Labs  ?Lab 12/24/21 ?0220 12/24/21 ?1644 12/25/21 ?0202 12/26/21 ?[id number] 12/27/21 ?0429  ?NA 153*   < > 143 145 147*  ?K 4.0   < > 3.2* 4.4 4.1  ?CL 118*   < > 107 111 114*  ?CO2 23   < > 22 23 20*  ?GLUCOSE 169*   < > 224* 353* 207*  ?BUN 40*   < > 31* 34* 29*  ?CREATININE 1.60*   < > 1.52* 1.70* 1.62*  ?CALCIUM 7.9*   < > 8.1* 8.5* 8.3*  ?MG 1.8  --  2.1 2.1  --   ?PHOS 3.1  --  2.4*  --   --   ? < > = values in this interval not displayed.  ? ? ?Lipid Panel:  ?Recent Labs  ?Lab 12/21/21 ?1240 12/24/21 ?0220  ?CHOL 85  --   ?TRIG 160* 117  ?HDL 32*  --   ?CHOLHDL 2.7  --   ?VLDL 32  --   ?[place] 21  --   ? ? ?HgbA1c:  ?Recent Labs  ?Lab 12/21/21 ?2246  ?HGBA1C 8.1*  ? ? ?Urine Drug Screen: No results for input(s): LABOPIA, COCAINSCRNUR, LABBENZ, AMPHETMU, THCU, LABBARB in the last 168 hours.  ?Alcohol Level No results for input(s): ETH in the last 168 hours. ? ?IMAGING past 24 hours ?MR BRAIN WO CONTRAST ? ?Result Date: 12/26/2021 ?CLINICAL DATA:  Stroke follow-up EXAM: MRI HEAD WITHOUT CONTRAST TECHNIQUE: Multiplanar, multiecho pulse sequences of the brain and surrounding structures were obtained without intravenous contrast. COMPARISON:   12/21/2021 FINDINGS: Motion degraded study. Brain: No acute infarct. The area of diffusion abnormality previously seen in the left temporal lobe is less conspicuous today. No acute or chronic hemorrhage. There is multifocal hyperintense T2-weighted signal within the white matter. Generalized atrophy. The midline structures are normal. Vascular: Major flow voids are preserved. Skull and upper cervical spine: Normal calvarium and skull base. Visualized upper cervical spine and soft tissues are normal. Sinuses/Orbits:No paranasal sinus fluid levels or advanced mucosal thickening. No mastoid or middle ear effusion. Normal orbits. IMPRESSION: 1. Motion degraded study. 2. No acute intracranial abnormality. The area of diffusion abnormality previously seen in the left temporal lobe is less conspicuous today. Electronically Signed   By: Ulyses Jarred M.D.   On: 12/26/2021 23:38   ? ?PHYSICAL EXAM ? ?Temp:  [97.8 ?  F (36.6 ?C)-99.8 ?F (37.7 ?C)] 98 ?F (36.7 ?C) (05/06 1415) ?Pulse Rate:  [86-104] 96 (05/06 1415) ?Resp:  [17-36] 31 (05/06 1415) ?BP: (72-161)/(45-72) 115/51 (05/06 1415) ?SpO2:  [91 %-100 %] 91 % (05/06 1415) ?Arterial Line BP: (78-173)/(30-96) 136/46 (05/06 1415) ?Weight:  [52.6 kg] 52.6 kg (05/06 0500) ? ?General - thin, well developed, lethargic but more awake after CT test. ? ?Ophthalmologic - fundi not visualized due to noncooperation. ? ?Cardiovascular - irregular rate.  ? ?Neuro - ?Awake, alert. Oriented to person, place time (year, month) follows all commands.  ? PERRL. EOMI. No facial droop. Tongue protrusion midline. No drift in b/l UE or LE.  ?Sensation intact to LT in all ext.,  ?Coordination: normal FTN. ?gait not tested. ? ? ?ASSESSMENT/PLAN ?Reginald Ayers is a 71 y.o. male with history of bilateral carotid artery stenosis (total occlusion of the left, moderate stenosis of the right), hypertension, hyperlipidemia, prior tobacco abuse, diabetes, coronary artery disease s/p CABG, peripheral  vascular disease s/p stents, COPD presenting with generalized weakness over the past 3 weeks.  On admission 4/29, he was found to be septic secondary to a community-acquired pneumonia, he was intubated and started on antibiotics.  He developed A-fib with RVR, became hypotensive and required vasopressors.  He has since converted back into normal sinus rhythm and is no longer requiring pressors. CT no acute finding.  MRI showed left temporal lobe white matter small infarct. Awaiting MRA, carotid duplex shows Rt ICA 80-99% occlusion and Lt ICA chronically occluded. Repeat MRI shows no new CVA. Transition to eliquis today.  ? ?Right MCA syndrome post right TCAR ?Presented with left hemiparesis, left HH, right gaze, lethargy, nonverbal ?CT stat no acute finding ?CTA head and neck no LVO ?Pt not a tPA candidate given recent stroke and today's surgery with carotid artery puncture in a site difficult to compress. Not IR candidate given no LVO.  ?Likely right brain hypoperfusion ?MRI neg for new CVA. ? ? ?Stroke: Punctate left temporal infarct likely secondary left ICA chronic occlusion with hypoperfusion. Afib as etiology possible but less likely given left ICA occluded. ?MRI - Left ICA with poor flow or occlusion in the neck and at the skull base. Evidence of reconstituted flow at the Left ICA terminus by MRI, but also asymmetric decreased flow in Left MCA branches. However, only trace acute ischemia is identified in the Left MCA territory (left temporal lobe white matter) with chronic cortical infarcts in the left middle and superior frontal gyri. Evidence of some underlying chronic small vessel disease including in the pons. ?CTA head and neck showed left ICA occlusion and right ICA stenosis > 80% ?Carotid Doppler left ICA chronically occluded, right ICA 80 to 90% stenosis ?2D Echo EF 50 to 55% ?LDL 21 ?HgbA1c 8.1 ?VTE prophylaxis - Heparin IV ?clopidogrel 75 mg daily prior to admission, now on ASA 81 and clopidogrel 75  mg daily and heparin IV. Transition to eliquis today. ?Therapy recommendations:  pending ?Disposition:  pending ? ?A-fib RVR, new diagnosis ?Now converted to NSR ?On heparin IV ?switch to eliquis today. Discussed with Dr. Trula Slade and he is ok with transition from surgery standpoint. ? ?Carotid stenosis/occlusion ?Left ICA chronic occlusion ?Right ICA stenosis 07/2019 60 to 79% stenosis.  This admission carotid Doppler 80 to 99% stenosis. ?CTA head and neck -  left ICA occlusion and right ICA stenosis > 80% ?Dr. Trula Slade on board ?S/p right TCAR 5/5 ?On ASA 81 and Plavix and heparin IV now ? ?  Sepsis d/t PNA ?On rocephin and azithromycin -> off ?Febrile with leukocytosis  ?WBC 14.2->14.0->11.2->8.7 ?Tmax 99.7->100.5->afebrile ? ?Respiratory failure with hypoxia ?Intubated, vent management per CCM ?Extubated on 5/2 ?Tolerating well ? ?AKI ?Cr baseline 1.42 ?Cr 2.03-> 1.82->1.70->1.60->1.52->1.70 ?Bicarb drip d/c'd ?S/p contrast for procedure and CTA 5/5 ? ? ?Hypotension ?Related to sedation versus sepsis and A-fib with RVR ?Off Norepinephrine now ?BP stable now. ?Avoid sBP <110 ? ? ?Diabetes type II Uncontrolled ?Home meds: Jardiance, metformin ?HgbA1c 8.1, goal < 7.0 ?CBGs ?SSI ?Close PCP follow-up and DM control ? ?Other Stroke Risk Factors ?Hypertension ?Home med: Losartan, metoprolol succinate ?Advanced Age >/= 61  ?Former cigarette smoker ?Coronary artery disease ?S/p CABG 09/2008  ?Congestive heart failure ?Home meds: Lasix ?Echo 50-55% ? ?Other Active Problems ?Peripheral arterial disease ?Post left common iliac and right SFA stenting  ?Cont plavix ?Protein calorie malnutrition ?NPO now just after extubation ? ?Hospital day # 7 ? ?Secure msg sent to Erin Hearing, pharmacist to make transition to eliquis.  ? ?Neurology will sign off. Please call with questions. He can f/u with stroke neurology outpt. ? ?This patient is critically ill due to code stroke with  carotid stenosis/occlusion, s/p right TCAR, PAF on  heparin and at significant risk of neurological worsening, death form recurrent stroke, hemorrhagic conversion, hyperperfusion syndrome, seizure. This patient's care requires constant monitoring of vital signs,

## 2021-12-27 NOTE — Progress Notes (Signed)
Speech Language Pathology Treatment: Dysphagia  ?Patient Details ?Name: Reginald Ayers ?MRN: [id number] ?DOB: 08/12/1951 ?Today's Date: 12/27/2021 ?Time: 8786-7672 ?SLP Time Calculation (min) (ACUTE ONLY): 21 min ? ?Assessment / Plan / Recommendation ?Clinical Impression ? Asked to re-assess pt's swallowing after neuro changes yesterday post-procedure.  Appears to be back to baseline. MRI showed no acute changes.  Mr. Goodner was interactive and pleasant.  He self-fed nectar-thick liquids and applesauce from breakfast tray with good oral attention, improved effort/mastication, no overt s/s of aspiration. Recommend resuming a dysphagia 1 diet with nectar thick liquids for now.  SLP will f/u early next week. D/W RN. ?  ?HPI HPI: 71 yo male presented 4/29 with sepsis secondary to CAP, requiring intubation and mechanical ventilation for respiratory failure. ETT 4/29-5/2. MRI: punctate left temporal infarct; chronic cortical infarcts in the left middle and superior frontal gyri.  History of bilateral carotid artery stenosis (total occlusion of the left, moderate stenosis of the right), HTN, HLD, prior tobacco abuse, diabetes, coronary artery disease s/p CABG, peripheral vascular disease s/p stents. Follow up for dysphagia management indicated. Right carotid stent placed 12/26/2021. Post-op with left hemiparesis; MRI no acute changes; resolved weakness. ?  ?   ?SLP Plan ? Continue with current plan of care ? ?  ?  ?Recommendations for follow up therapy are one component of a multi-disciplinary discharge planning process, led by the attending physician.  Recommendations may be updated based on patient status, additional functional criteria and insurance authorization. ?  ? ?Recommendations  ?Diet recommendations: Dysphagia 1 (puree);Nectar-thick liquid ?Liquids provided via: Cup;Straw ?Medication Administration: Whole meds with puree ?Supervision: Patient able to self feed;Full supervision/cueing for compensatory  strategies ?Compensations: Small sips/bites;Slow rate;Minimize environmental distractions  ?   ?    ?   ? ? ? ? Oral Care Recommendations: Oral care BID ?Follow Up Recommendations: Other (comment) (tbd) ?Assistance recommended at discharge: Frequent or constant Supervision/Assistance ?Plan: Continue with current plan of care ? ? ? ? ?  ?  ?Harveer Sadler L. Param Capri, MA CCC/SLP ?Acute Rehabilitation Services ?Office number [phone] ?Pager [phone] ? ? ?Blenda Mounts [place]ice ? ?12/27/2021, 10:42 AM ?

## 2021-12-27 NOTE — Progress Notes (Signed)
ANTICOAGULATION CONSULT NOTE- follow-up ?Pharmacy Consult for heparin ?Indication: atrial fibrillation ? ?Allergies  ?Allergen Reactions  ? Actos [Pioglitazone] Shortness Of Breath  ?  Leg swelling   ? Atorvastatin   ?  Other reaction(s): leg pain  ? Lisinopril Cough  ? ? ?Patient Measurements: ?Height: 5\' 1"  (154.9 cm) ?Weight: 52.6 kg (115 lb 15.4 oz) ?IBW/kg (Calculated) : 52.3 ?Heparin Dosing Weight: 51.2 kg ? ?Vital Signs: ?Temp: 97.9 ?F (36.6 ?C) (05/06 1200) ?Temp Source: Oral (05/06 1200) ?BP: 144/60 (05/06 1200) ?Pulse Rate: 104 (05/06 1200) ? ?Labs: ?Recent Labs  ?  12/25/21 ?0202 12/25/21 ?1149 12/25/21 ?1848 12/26/21 ?[id number] 12/27/21 ?0429  ?HGB 9.8*  --   --   --  7.0*  ?HCT 30.7*  --   --   --  22.9*  ?PLT 207  --   --   --  185  ?HEPARINUNFRC 0.22*   < > 0.52 0.52 0.32  ?CREATININE 1.52*  --   --  1.70* 1.62*  ? < > = values in this interval not displayed.  ? ? ?Estimated Creatinine Clearance: 30.5 mL/min (A) (by C-G formula based on SCr of 1.62 mg/dL (H)). ? ?Assessment: ?71 y.o. male with Afib and new CVA for heparin. Pt s/p TCAR on 5/5. S/p procedure code stroke was called and no bleeding was visualized on head CT.  Neuro consulted and is c/f small R MCA infarct vs. right brain hypoperfusion. No TNK given and pt not an IR candidate. MRI pending. ? ?Heparin level at goal this morning on 1150 units/hr. No bleeding issues noted, however hgb is down from 9.8>7.0. Platelet within normal limits.  ? ?Goal of Therapy:  ?Heparin level 0.3-0.5 units/ml ?Monitor platelets by anticoagulation protocol: Yes ?  ?Plan:  ?Continue heparin at 1150 units/hr ?Check heparin level daily ?Daily CBC  ?Plan for swallow eval later today ?F/u plan for transition to eliquis ? ?Erin Hearing PharmD., BCPS ?Clinical Pharmacist ?12/27/2021 12:52 PM ? ?

## 2021-12-27 NOTE — Progress Notes (Addendum)
?      ?                 PROGRESS NOTE ? ?      ?PATIENT DETAILS ?Name: Reginald Ayers ?Age: 71 y.o. ?Sex: male ?Date of Birth: May 05, 1951 ?Admit Date: 12/20/2021 ?Admitting Physician Kipp Brood, MD ?[id number], Jori Moll, MD ? ?Brief Summary: ?Patient is a 71 y.o.  male with history of PAD, CAD s/p CABG, HTN, HLD, COPD-who presented with weakness/shortness of breath-he was found to have acute hypoxic respiratory failure due to multifocal pneumonia-was intubated in the emergency room and subsequently admitted to the ICU by PCCM.  Further hospital course was complicated by acute CVA, A-fib RVR.  Upon further work-up-patient was found to have 80% stenosis of right ICA-underwent s/p right carotid stenting on 5/5-following which patient developed transient left-sided weakness likely due to hypoperfusion.  See below for further details. ? ?Significant events: ?4/29>>Intubated and admitted to the ICU. >> A fib and elevated troponins ?4/30>> MRI with acute CVA. ?5/02>> extubated  ?5/04>> transfer to [facility]. ?5/05>> TCAR-postoperatively-left hemiparesis-code stroke-transfer to ICU.  Repeat MRI brain-without acute CVA.   ?5/06>> postoperative left-sided weakness resolved-was likely due to hypoperfusion ? ?Significant studies: ?4/29>> CT head: No acute intracranial abnormality. ?4/29>> CXR: Right lower lobe airspace consolidation. ?4/29>> CT chest/abdomen/pelvis: Multifocal PNA ?4/30>> A1c: 8.1 ?4/30>> LDL: 21 LDL of 20 ?4/30>> MRI brain: Acute ischemia left temporal lobe, left ICA occlusion. ?5/01>> Echo: EF 50-55%, dyskinesis of left ventricular apical segment. ?5/04>> CTA neck:> 80% stenosis of right ICA, left ICA occluded. ?5/04>> CTA head: Left ICA occluded at the precavernous/cavernous segments-reconstitution within the paraclinoid and supraclinoid ICA.  No other large LVO. ?5/05>> repeat MRI brain: No acute CVA ?5/05>> repeat CTA head/neck: Stenting of right ICA-unchanged occlusion of left ICA-no other hemodynamically  significant stenosis. ? ? ?Significant microbiology data: ?4/29>> COVID/influenza PCR: Negative ?4/29>> blood culture: No growth ?4/30>> trach aspirate: Normal respiratory flora ? ?Procedures: ?4/29>> 5/2:ETT: ?5/05>> TCAR/right carotid stent ? ?Consults: ?PCCM, neurology, vascular surgery, cardiology ? ?Subjective: ?Sitting up-wanting to eat-bedside swallow screen done with RN-tolerated oral intake without any issues.  No left-sided weakness-complains of limited movement in the left upper extremity due to shoulder pain. ? ?Objective: ?Vitals: ?Blood pressure 138/66, pulse 100, temperature 99.3 ?F (37.4 ?C), temperature source Axillary, resp. rate 20, height 5\' 1"  (1.549 m), weight 52.6 kg, SpO2 97 %.  ? ?Exam: ?No major issues overnight-was awake and alert.  For TCAR today. ? ?Pertinent Labs/Radiology: ? ?  Latest Ref Rng & Units 12/27/2021  ?  4:29 AM 12/25/2021  ?  2:02 AM 12/24/2021  ?  2:20 AM  ?CBC  ?WBC 4.0 - 10.5 K/uL 8.3   8.8   8.7    ?Hemoglobin 13.0 - 17.0 g/dL 7.0   9.8   8.4    ?Hematocrit 39.0 - 52.0 % 22.9   30.7   26.3    ?Platelets 150 - 400 K/uL 185   207   207    ?  ?Lab Results  ?Component Value Date  ? NA 147 (H) 12/27/2021  ? K 4.1 12/27/2021  ? CL 114 (H) 12/27/2021  ? CO2 20 (L) 12/27/2021  ? ?  ? ?Assessment/Plan: ?Severe sepsis due to PNA ?Acute hypoxic respiratory failure ?Sepsis physiology has resolved-hypoxia stable on 2-3 L of oxygen.  All cultures negative to date-completed course of antibiotics. ? ?PAF with RVR ?Maintaining sinus rhythm-A-fib likely triggered by PNA/hypoxemia.  Echo stable.  Recent TSH February stable.  On IV heparin-transition to Eliquis when okay with neurology/vascular surgery. ? ?Acute CVA ?Unclear whether this is a embolic etiology from A-fib or from hypoperfusion in the setting of left ICA occlusion.  Work-up as above-on ASA/Plavix/statin. ? ?Left ICA occlusion ?More than 80% right ICA stenosis s/p TCAR on 5/5-complicated by transient left-sided hemiparesis  postoperatively ?Transient left-sided weakness has resolved-likely due to hypoperfusion-repeat MRI brain on 5/5 negative for CVA.  Recommendations from vascular surgery are to continue aspirin/Plavix x6 weeks in addition to anticoagulation for atrial fibrillation.  After 6 weeks-vascular surgery will transition to a single antiplatelet agent. ? ?Acute metabolic encephalopathy: ?2/2 sepsis/hypoxia-improved-relatively awake and alert ? ?AKI on CKD stage 3a: ?Likely hemodynamically mediated-and approaching usual baseline-slowly improving-continue to follow. ? ?Hypernatremia: Mild-kept n.p.o. since yesterday for TCAR-subsequently n.p.o. continued due to CVA symptoms.  We will encourage oral intake today-and repeat electrolytes tomorrow.  Hold off on starting IVF for now  ? ?Normocytic anemia: Due to acute illness-hemoglobin down to 7.0 today-no obvious GI bleeding apparent.  Given drop in hemoglobin/CAD/CVA-need for dual antiplatelet agent/anticoagulation-we will transfuse 1 unit of PRBC. ? ?Elevated troponins: ?Likely demand ischemia-echo with stable EF.  Cardiology consulted. ? ?History of CAD s/p CABG 2010 ?On antiplatelet/beta-blocker-previously not on statin due to myalgias-but seems to be tolerating Crestor well. ? ?History of PAD s/p left common iliac/right SFA stenting ?Continue antiplatelets/statin. ? ?Hypokalemia:  ?Repleted. ? ?DM-2:  ?CBG stable-continue SSI ? ?Recent Labs  ?  12/26/21 ?2237 12/27/21 ?0434 12/27/21 ?[id number]  ?GLUCAP 148* 194* 197*  ? ?  ?COPD ?Continue bronchodilators-not in exacerbation ? ?Nutrition Status: ?Nutrition Problem: Severe Malnutrition ?Etiology: chronic illness (COPD, CAD) ?Signs/Symptoms: severe muscle depletion, severe fat depletion, percent weight loss (13% weight loss within 3 months) ?Percent weight loss: 13 % ?Interventions: Hormel Shake, MVI ? ?Pressure Ulcer: ?Pressure Injury 12/20/21 Buttocks Right;Left Stage 1 -  Intact skin with non-blanchable redness of a localized area  usually over a bony prominence. wound is 0.5 cm lenght  by 0.6 cm width (Active)  ?12/20/21 1831  ?Location: Buttocks  ?Location Orientation: Right;Left  ?Staging: Stage 1 -  Intact skin with non-blanchable redness of a localized area usually over a bony prominence.  ?Wound Description (Comments): wound is 0.5 cm lenght  by 0.6 cm width  ?Present on Admission: Yes  ?Dressing Type None 12/27/21 0751  ? ? ?BMI: ?Estimated body mass index is 21.91 kg/m? as calculated from the following: ?  Height as of this encounter: 5\' 1"  (1.549 m). ?  Weight as of this encounter: 52.6 kg.  ? ?Code status: ?  Code Status: Full Code  ? ?DVT Prophylaxis: ? IV heparin ? ?Family Communication: None at bedside ? ? ?Disposition Plan: ?Status is: Inpatient ?Remains inpatient appropriate because: Admitted with sepsis from PNA-developed CVA-s/p TCAR of right carotid artery stenosis-developed transient strokelike symptoms from hypoperfusion-needs PRBC transfusion today.  Not stable for discharge-SNF sometime next week. ?  ?Planned Discharge Destination:Skilled nursing facility ? ? ?Diet: ?Diet Order   ? ?       ?  DIET - DYS 1 Room service appropriate? Yes; Fluid consistency: Nectar Thick  Diet effective now       ?  ? ?  ?  ? ?  ?  ? ? ?Antimicrobial agents: ?Anti-infectives (From admission, onward)  ? ? Start     Dose/Rate Route Frequency Ordered Stop  ? 12/27/21 0600  ceFAZolin (ANCEF) IVPB 2g/100 mL premix       ? 2 g ?200 mL/hr over  30 Minutes Intravenous On call to O.R. 12/26/21 1218 12/26/21 1352  ? 12/21/21 1200  cefTRIAXone (ROCEPHIN) 2 g in sodium chloride 0.9 % 100 mL IVPB       ? 2 g ?200 mL/hr over 30 Minutes Intravenous Every 24 hours 12/20/21 1444 12/24/21 1430  ? 12/20/21 1500  cefTRIAXone (ROCEPHIN) 1 g in sodium chloride 0.9 % 100 mL IVPB       ?Note to Pharmacy: Was supposed to be given 2 g but ED is order set only gives 1 g  ? 1 g ?200 mL/hr over 30 Minutes Intravenous  Once 12/20/21 1443 12/20/21 2112  ? 12/20/21 1415   cefTRIAXone (ROCEPHIN) 1 g in sodium chloride 0.9 % 100 mL IVPB       ? 1 g ?200 mL/hr over 30 Minutes Intravenous  Once 12/20/21 1409 12/20/21 1556  ? 12/20/21 1415  azithromycin (ZITHROMAX) 500 mg in sodium chlori

## 2021-12-28 DIAGNOSIS — J189 Pneumonia, unspecified organism: Secondary | ICD-10-CM | POA: Diagnosis not present

## 2021-12-28 DIAGNOSIS — N179 Acute kidney failure, unspecified: Secondary | ICD-10-CM | POA: Diagnosis not present

## 2021-12-28 DIAGNOSIS — I63132 Cerebral infarction due to embolism of left carotid artery: Secondary | ICD-10-CM | POA: Diagnosis not present

## 2021-12-28 DIAGNOSIS — J9601 Acute respiratory failure with hypoxia: Secondary | ICD-10-CM | POA: Diagnosis not present

## 2021-12-28 LAB — BASIC METABOLIC PANEL
Anion gap: 11 (ref 5–15)
BUN: 30 mg/dL — ABNORMAL HIGH (ref 8–23)
CO2: 24 mmol/L (ref 22–32)
Calcium: 7.8 mg/dL — ABNORMAL LOW (ref 8.9–10.3)
Chloride: 107 mmol/L (ref 98–111)
Creatinine, Ser: 1.6 mg/dL — ABNORMAL HIGH (ref 0.61–1.24)
GFR, Estimated: 45 mL/min — ABNORMAL LOW (ref >60)
Glucose, Bld: 231 mg/dL — ABNORMAL HIGH (ref 70–99)
Potassium: 3.6 mmol/L (ref 3.5–5.1)
Sodium: 142 mmol/L (ref 135–145)

## 2021-12-28 LAB — GLUCOSE, CAPILLARY
Glucose-Capillary: 218 mg/dL — ABNORMAL HIGH (ref 70–99)
Glucose-Capillary: 219 mg/dL — ABNORMAL HIGH (ref 70–99)
Glucose-Capillary: 323 mg/dL — ABNORMAL HIGH (ref 70–99)
Glucose-Capillary: 293 mg/dL — ABNORMAL HIGH (ref 70–99)
Glucose-Capillary: 251 mg/dL — ABNORMAL HIGH (ref 70–99)

## 2021-12-28 LAB — BPAM RBC
Blood Product Expiration Date: 202305102359
ISSUE DATE / TIME: 202305061127
Unit Type and Rh: 9500

## 2021-12-28 LAB — CBC
HCT: 28.6 % — ABNORMAL LOW (ref 39.0–52.0)
Hemoglobin: 9 g/dL — ABNORMAL LOW (ref 13.0–17.0)
MCH: 29.7 pg (ref 26.0–34.0)
MCHC: 31.5 g/dL (ref 30.0–36.0)
MCV: 94.4 fL (ref 80.0–100.0)
Platelets: 193 10*3/uL (ref 150–400)
RBC: 3.03 MIL/uL — ABNORMAL LOW (ref 4.22–5.81)
RDW: 14.6 % (ref 11.5–15.5)
WBC: 9.8 10*3/uL (ref 4.0–10.5)
nRBC: 0 % (ref 0.0–0.2)

## 2021-12-28 LAB — TYPE AND SCREEN
ABO/RH(D): O NEG
Antibody Screen: NEGATIVE
Unit division: 0

## 2021-12-28 MED ORDER — APIXABAN 2.5 MG PO TABS
2.5000 mg | ORAL_TABLET | Freq: Two times a day (BID) | ORAL | Status: DC
  Administered 2021-12-28 – 2021-12-31 (×6): 2.5 mg via ORAL
  Filled 2021-12-28 (×6): qty 1

## 2021-12-28 MED ORDER — CHLORHEXIDINE GLUCONATE 0.12 % MT SOLN
OROMUCOSAL | Status: AC
  Filled 2021-12-28: qty 15

## 2021-12-28 MED ORDER — POTASSIUM CHLORIDE 20 MEQ PO PACK
40.0000 meq | PACK | Freq: Once | ORAL | Status: AC
  Administered 2021-12-28: 40 meq via ORAL
  Filled 2021-12-28: qty 2

## 2021-12-28 MED ORDER — INSULIN GLARGINE-YFGN 100 UNIT/ML ~~LOC~~ SOLN
10.0000 [IU] | Freq: Every day | SUBCUTANEOUS | Status: DC
Start: 2021-12-28 — End: 2021-12-29
  Administered 2021-12-28: 10 [IU] via SUBCUTANEOUS
  Filled 2021-12-28 (×2): qty 0.1

## 2021-12-28 NOTE — Progress Notes (Addendum)
PROGRESS NOTE        PATIENT DETAILS Name: Reginald Ayers Age: 71 y.o. Sex: male Date of Birth: May 06, 1951 Admit Date: 12/20/2021 Admitting Physician Lynnell Catalan, MD ZOX:WRUEAV, Windy Fast, MD  Brief Summary: Patient is a 71 y.o.  male with history of PAD, CAD s/p CABG, HTN, HLD, COPD-who presented with weakness/shortness of breath-he was found to have acute hypoxic respiratory failure due to multifocal pneumonia-was intubated in the emergency room and subsequently admitted to the ICU by PCCM. Upon further work-up in ICU-patient was found to Sub acute CVA &  80% stenosis of right ICA-underwent s/p right carotid stenting on 5/5-following which patient developed transient left-sided weakness likely due to hypoperfusion & New acute CVA.  He also had A-fib RVR during perioperative period,  See below for further details.  Transferred to my service on 12/28/2021  Significant events: 4/29>>Intubated and admitted to the ICU. >> A fib and elevated troponins 4/30>> MRI with acute CVA. 5/02>> extubated  5/04>> transfer to [facility] 5/05>> TCAR-postoperatively-left hemiparesis-code stroke-transfer to ICU.  Repeat MRI brain-without acute CVA.   5/06>> postoperative left-sided weakness resolved-was likely due to hypoperfusion  Significant studies: 4/29>> CT head: No acute intracranial abnormality. 4/29>> CXR: Right lower lobe airspace consolidation. 4/29>> CT chest/abdomen/pelvis: Multifocal PNA 4/30>> A1c: 8.1 4/30>> LDL: 21 LDL of 20 4/30>> MRI brain: Acute ischemia left temporal lobe, left ICA occlusion. 5/01>> Echo: EF 50-55%, dyskinesis of left ventricular apical segment. 5/04>> CTA neck:> 80% stenosis of right ICA, left ICA occluded. 5/04>> CTA head: Left ICA occluded at the precavernous/cavernous segments-reconstitution within the paraclinoid and supraclinoid ICA.  No other large LVO. 5/05>> repeat MRI brain: No acute CVA 5/05>> repeat CTA head/neck: Stenting of right  ICA-unchanged occlusion of left ICA-no other hemodynamically significant stenosis.   Significant microbiology data: 4/29>> COVID/influenza PCR: Negative 4/29>> blood culture: No growth 4/30>> trach aspirate: Normal respiratory flora  Procedures: 4/29>> 5/2:ETT: 5/05>> TCAR/right carotid stent  Consults: PCCM, neurology, vascular surgery, cardiology  Subjective: Sitting up-wanting to eat-bedside swallow screen done with RN-tolerated oral intake without any issues.  No left-sided weakness-complains of limited movement in the left upper extremity due to shoulder pain.  Objective: Vitals: Blood pressure 135/61, pulse (!) 108, temperature 97.7 F (36.5 C), temperature source Oral, resp. rate 18, height 5\' 1"  (1.549 m), weight 46.9 kg, SpO2 97 %.   Exam:  Awake Alert, No new F.N deficits, Normal affect .AT,PERRAL Supple Neck, No JVD,   Symmetrical Chest wall movement, Good air movement bilaterally, CTAB RRR,No Gallops, Rubs or new Murmurs,  +ve B.Sounds, Abd Soft, No tenderness,   No Cyanosis, Clubbing or edema   Assessment/Plan:  Severe sepsis due to PNA - Acute hypoxic respiratory failure - Sepsis physiology has resolved-hypoxia stable on 2-3 L of oxygen.  All cultures negative to date-completed course of antibiotics.  Continue close follow-up by speech encouraged to use I-S and flutter valve for pulmonary toiletry  Sub Acute L. Temporal lobe CVA upon presentation, R. Carotid TCAR with peri op R. MCA hypoperfusion / R. MCA small Infarct and L sides weakness. Unclear whether this is a embolic etiology from A-fib or from hypoperfusion in the setting of left ICA occlusion.  Work-up as above-on ASA/Plavix/statin, along with Eliquis as above.  Per vascular surgery after 30 days stop Plavix and continue aspirin Eliquis and statin.  Discussed with stroke team on 12/28/2021  as well  Left ICA occlusion, More than 80% right ICA stenosis s/p TCAR on 5/5-vascular surgery following, currently  on combination of statin, aspirin, Plavix and Eliquis.  After 30 days discontinue Plavix.  PAF with RVR with [place] vas 2 score of greater than 5 - Maintaining sinus rhythm-A-fib likely triggered by PNA/hypoxemia.  Echo stable.  Recent TSH February stable.  Now on Eliquis along with beta-blocker for rate control.  Acute metabolic encephalopathy: 2/2 sepsis/hypoxia-improved-relatively awake and alert  AKI on CKD stage 3a: Likely hemodynamically mediated-and approaching usual baseline-slowly improving-continue to follow.  Elevated troponins: Likely demand ischemia-echo with stable EF.  Cardiology consulted.  History of CAD s/p CABG 2010 - On antiplatelet/beta-blocker-previously not on statin due to myalgias-but seems to be tolerating Crestor well.  History of PAD s/p left common iliac/right SFA stenting - Continue antiplatelets/statin.  Normocytic anemia: Due to acute illness-hemoglobin down to 7.0 today-no obvious GI bleeding apparent.  Given drop in hemoglobin/CAD/CVA-need for dual antiplatelet agent/anticoagulation-we will transfuse 1 unit of PRBC.  COPD Continue bronchodilators-not in exacerbation  DM-2:  CBG stable-continue SSI  Recent Labs    12/27/21 2103 12/28/21 0657 12/28/21 0751  GLUCAP 198* 218* 219*      Nutrition Status: Nutrition Problem: Severe Malnutrition Etiology: chronic illness (COPD, CAD) Signs/Symptoms: severe muscle depletion, severe fat depletion, percent weight loss (13% weight loss within 3 months) Percent weight loss: 13 % Interventions: Hormel Shake, MVI  Pressure Ulcer: Pressure Injury 12/20/21 Buttocks Right;Left Stage 1 -  Intact skin with non-blanchable redness of a localized area usually over a bony prominence. wound is 0.5 cm lenght  by 0.6 cm width (Active)  12/20/21 1831  Location: Buttocks  Location Orientation: Right;Left  Staging: Stage 1 -  Intact skin with non-blanchable redness of a localized area usually over a bony prominence.  Wound  Description (Comments): wound is 0.5 cm lenght  by 0.6 cm width  Present on Admission: Yes  Dressing Type None 12/28/21 0800    BMI: Estimated body mass index is 19.54 kg/m as calculated from the following:   Height as of this encounter: 5\' 1"  (1.549 m).   Weight as of this encounter: 46.9 kg.   Code status:   Code Status: Full Code   DVT Prophylaxis:  IV heparin  Family Communication: None at bedside   Disposition Plan: Status is: Inpatient Remains inpatient appropriate because: Admitted with sepsis from PNA-developed CVA-s/p TCAR of right carotid artery stenosis-developed transient strokelike symptoms from hypoperfusion-needs PRBC transfusion today.  Not stable for discharge-SNF sometime next week.   Planned Discharge Destination:Skilled nursing facility   Diet: Diet Order             DIET - DYS 1 Room service appropriate? Yes; Fluid consistency: Nectar Thick  Diet effective now                    MEDICATIONS: Scheduled Meds:  apixaban  5 mg Oral BID   arformoterol  15 mcg Nebulization BID   aspirin EC  81 mg Oral Daily   budesonide (PULMICORT) nebulizer solution  0.25 mg Nebulization BID   chlorhexidine gluconate (MEDLINE KIT)  15 mL Mouth Rinse BID   Chlorhexidine Gluconate Cloth  6 each Topical Daily   clopidogrel  75 mg Oral Daily   docusate sodium  100 mg Oral BID   insulin aspart  0-20 Units Subcutaneous TID WC   insulin aspart  0-5 Units Subcutaneous QHS   insulin aspart  3 Units Subcutaneous TID WC  mouth rinse  15 mL Mouth Rinse QID   metoprolol tartrate  25 mg Oral BID   multivitamin with minerals  1 tablet Oral Daily   polyethylene glycol  17 g Oral Daily   revefenacin  175 mcg Nebulization Daily   rosuvastatin  20 mg Oral Daily   sodium chloride flush  3 mL Intravenous Q12H   Continuous Infusions:  sodium chloride Stopped (12/25/21 1051)   feeding supplement (VITAL AF 1.2 CAL) 1,000 mL (12/23/21 0236)   PRN Meds:.acetaminophen **OR**  acetaminophen, albuterol, labetalol, ondansetron **OR** ondansetron (ZOFRAN) IV   I have personally reviewed following labs and imaging studies  LABORATORY DATA:  Recent Labs  Lab 12/23/21 0459 12/24/21 0220 12/25/21 0202 12/27/21 0429 12/27/21 1600 12/28/21 0133  WBC 11.2* 8.7 8.8 8.3  --  9.8  HGB 9.0* 8.4* 9.8* 7.0* 9.3* 9.0*  HCT 26.8* 26.3* 30.7* 22.9* 28.5* 28.6*  PLT 211 207 207 185  --  193  MCV 90.8 95.3 93.6 96.6  --  94.4  MCH 30.5 30.4 29.9 29.5  --  29.7  MCHC 33.6 31.9 31.9 30.6  --  31.5  RDW 15.3 15.1 14.6 14.6  --  14.6  LYMPHSABS 0.7  --   --   --   --   --   MONOABS 0.5  --   --   --   --   --   EOSABS 0.0  --   --   --   --   --   BASOSABS 0.0  --   --   --   --   --     Recent Labs  Lab 12/21/21 1202 12/21/21 2246 12/22/21 0130 12/22/21 1107 12/23/21 0459 12/23/21 0654 12/24/21 0220 12/24/21 1644 12/25/21 0202 12/26/21 0556 12/27/21 0429 12/28/21 0133  NA  --   --  142 147* 151*   < > 153* 143 143 145 147* 142  K  --   --  3.8 3.2* 3.0*   < > 4.0 3.7 3.2* 4.4 4.1 3.6  CL   < >  --  112* 114* 118*   < > 118* 107 107 111 114* 107  CO2   < >  --  20* 23 21*   < > 23 22 22 23  20* 24  GLUCOSE   < >  --  209* 176* 263*   < > 169* 381* 224* 353* 207* 231*  BUN   < >  --  46* 46* 46*   < > 40* 34* 31* 34* 29* 30*  CREATININE   < >  --  1.82* 1.75* 1.71*   < > 1.60* 1.53* 1.52* 1.70* 1.62* 1.60*  CALCIUM   < >  --  8.0* 7.8* 7.7*   < > 7.9* 7.9* 8.1* 8.5* 8.3* 7.8*  AST  --   --   --   --  29  --   --   --   --   --   --   --   ALT  --   --   --   --  23  --   --   --   --   --   --   --   ALKPHOS  --   --   --   --  69  --   --   --   --   --   --   --   BILITOT  --   --   --   --  0.3  --   --   --   --   --   --   --   ALBUMIN  --   --   --   --  2.0*  --   --   --   --   --   --   --   MG  --   --  2.3  --  2.0  --  1.8  --  2.1 2.1  --   --   PHOS  --   --  1.1* 1.3* 5.5*  --  3.1  --  2.4*  --   --   --   HGBA1C  --  8.1*  --   --   --   --    --   --   --   --   --   --    < > = values in this interval not displayed.           RADIOLOGY STUDIES/RESULTS: MR BRAIN WO CONTRAST  Result Date: 12/26/2021 CLINICAL DATA:  Stroke follow-up EXAM: MRI HEAD WITHOUT CONTRAST TECHNIQUE: Multiplanar, multiecho pulse sequences of the brain and surrounding structures were obtained without intravenous contrast. COMPARISON:  12/21/2021 FINDINGS: Motion degraded study. Brain: No acute infarct. The area of diffusion abnormality previously seen in the left temporal lobe is less conspicuous today. No acute or chronic hemorrhage. There is multifocal hyperintense T2-weighted signal within the white matter. Generalized atrophy. The midline structures are normal. Vascular: Major flow voids are preserved. Skull and upper cervical spine: Normal calvarium and skull base. Visualized upper cervical spine and soft tissues are normal. Sinuses/Orbits:No paranasal sinus fluid levels or advanced mucosal thickening. No mastoid or middle ear effusion. Normal orbits. IMPRESSION: 1. Motion degraded study. 2. No acute intracranial abnormality. The area of diffusion abnormality previously seen in the left temporal lobe is less conspicuous today. Electronically Signed   By: Deatra Robinson M.D.   On: 12/26/2021 23:38   CT HEAD CODE STROKE WO CONTRAST`  Result Date: 12/26/2021 CLINICAL DATA:  Code stroke. Provided history: Post procedure, possible stroke. EXAM: CT HEAD WITHOUT CONTRAST TECHNIQUE: Contiguous axial images were obtained from the base of the skull through the vertex without intravenous contrast. RADIATION DOSE REDUCTION: This exam was performed according to the departmental dose-optimization program which includes automated exposure control, adjustment of the mA and/or kV according to patient size and/or use of iterative reconstruction technique. COMPARISON:  Noncontrast head CT and CT angiogram head/neck 12/25/2021. Brain MRI 12/21/2021. FINDINGS: The examination is  moderately motion degraded, limiting evaluation. Brain: Mild generalized parenchymal atrophy. A known tiny subacute infarct within the posterior left temporal lobe white matter was better appreciated on the recent prior brain MRI of 12/21/2021. Known small chronic cortical infarcts within the left frontal lobe. Background mild patchy and ill-defined hypoattenuation within the cerebral white matter, nonspecific but compatible with chronic small vessel ischemic disease. Small chronic infarct within the right cerebellar hemisphere, better appreciated on the prior MRI. Within the limitations of motion degradation, no acute intracranial hemorrhage, acute demarcated cortical infarct, extra-axial fluid collection or intracranial mass is identified. No midline shift. Vascular: No hyperdense vessel.  Atherosclerotic calcifications. Skull: No acute fracture or aggressive osseous lesion. Nonspecific chronic thinning of the bilateral parietal calvarium. Sinuses/Orbits: No mass or acute finding within the imaged orbits. Postsurgical appearance of the paranasal sinuses. Mild mucosal thickening within the paranasal sinuses. Additionally, small-volume frothy secretions are present within the bilateral sphenoid and maxillary sinuses. Other:  Bilateral mastoid effusions. ASPECTS [facility] Stroke Program Early CT Score) - Ganglionic level infarction (caudate, lentiform nuclei, internal capsule, insula, M1-M3 cortex): 7 - Supraganglionic infarction (M4-M6 cortex): 3 Total score (0-10 with 10 being normal): 10 No acute intracranial abnormality identified. These results were communicated to Dr. Roda Shutters At 3:48 pmon 12/26/2021 by text page via the [facility] messaging system. IMPRESSION: Moderately motion degraded exam, limiting evaluation. A known tiny subacute infarct within the posterior left temporal lobe white matter was better appreciated on the recent prior brain MRI of 12/21/2021. Within the limitations of motion degradation, no interval acute  intracranial abnormality is identified. Parenchymal atrophy, chronic small vessel ischemic disease and chronic infarcts as described. Paranasal sinus disease. Correlate for acute on chronic sinusitis. Bilateral mastoid effusions. Electronically Signed   By: Jackey Loge D.O.   On: 12/26/2021 15:49   Structural Heart Procedure  Result Date: 12/26/2021 See surgical note for result.  HYBRID OR IMAGING ([facility] ONLY)  Result Date: 12/26/2021 There is no interpretation for this exam.  This order is for images obtained during a surgical procedure.  Please See "Surgeries" Tab for more information regarding the procedure.   CT ANGIO HEAD NECK W WO CM (CODE STROKE)  Result Date: 12/26/2021 CLINICAL DATA:  Stroke, assess intracranial arteries interval right intraparotid artery stent EXAM: CT ANGIOGRAPHY HEAD AND NECK TECHNIQUE: Multidetector CT imaging of the head and neck was performed using the standard protocol during bolus administration of intravenous contrast. Multiplanar CT image reconstructions and MIPs were obtained to evaluate the vascular anatomy. Carotid stenosis measurements (when applicable) are obtained utilizing NASCET criteria, using the distal internal carotid diameter as the denominator. RADIATION DOSE REDUCTION: This exam was performed according to the departmental dose-optimization program which includes automated exposure control, adjustment of the mA and/or kV according to patient size and/or use of iterative reconstruction technique. CONTRAST:  50mL OMNIPAQUE IOHEXOL 350 MG/ML SOLN COMPARISON:  Same-day noncontrast head CT, CTA head/neck dated 1 day prior FINDINGS: CTA NECK FINDINGS Aortic arch: There is mild calcified plaque in the imaged aortic arch. The origins of the major branch vessels are patent. The subclavian arteries are patent with scattered plaque throughout. Right carotid system: The right common carotid artery is patent with soft plaque resulting in less than 50% stenosis. There has  been interval stenting of the right internal carotid artery with a stent traversing the bifurcation. There is mild narrowing of the stent at the level of the calcified plaque in the proximal right internal carotid artery but no evidence of persistent/residual hemodynamically significant stenosis. The right external carotid artery branches are patent. There is no evidence of dissection or aneurysm. Left carotid system: The left common carotid artery is patent with soft plaque resulting in less than 50% stenosis. The left internal carotid artery remains occluded from the bifurcation throughout the intracranial segment. The left external carotid artery is patent. Vertebral arteries: The vertebral arteries are patent without hemodynamically significant stenosis or occlusion. There is no dissection or aneurysm. Skeleton: There is mild degenerative change in the cervical spine. There is no acute osseous abnormality or suspicious osseous lesion. There is no visible canal hematoma. Other neck: Soft tissue gas throughout the right neck is consistent with recent procedure. The soft tissues are otherwise unremarkable. Upper chest: There is marked emphysema in the lung apices. Review of the MIP images confirms the above findings CTA HEAD FINDINGS Anterior circulation: There is calcified plaque in the right intracranial ICA resulting in up to mild stenosis, unchanged. The left  internal carotid artery remains occluded from the skull base through the ophthalmic segment where there is reconstitution of flow, though there is calcified plaque within the reconstituted segment resulting in moderate stenosis of the paraclinoid segment, unchanged. The bilateral MCAs are patent. The bilateral ACAs are patent. The anterior communicating artery is normal. There is no aneurysm or AVM. Posterior circulation: The bilateral V4 segments are patent with minimal plaque on the left. The PICA origin is seen on the right but not definitely seen on  the left. The basilar artery is patent. The bilateral PCAs are patent. The posterior communicating arteries are not seen. There is no aneurysm or AVM. Venous sinuses: Heterogeneous enhancement in the right transverse sinus is increased in conspicuity since the prior study. Anatomic variants: None. Review of the MIP images confirms the above findings IMPRESSION: 1. Interval revascularization and stenting of the right common/internal carotid arteries. The stent is patient with mild residual narrowing at the bifurcation at the site of calcified plaque but no hemodynamically significant stenosis. The distal right internal carotid artery is widely patent in the neck. 2. 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 3. Otherwise, patent intracranial vasculature with no proximal high-grade stenosis or occlusion. 4. Heterogeneous enhancement of the right transverse sinus is increased in conspicuity since the prior study. While this may reflect mixing artifact related to bolus timing, findings concerning for venous thrombosis. Recommend MRV of the head with and without contrast for further evaluation. Aortic Atherosclerosis (ICD10-I70.0) and Emphysema (ICD10-J43.9). Electronically Signed   By: Lesia Hausen M.D.   On: 12/26/2021 16:10     LOS: 8 days   Signature  Susa Raring M.D on 12/28/2021 at 11:00 AM   -  To page go to [email or web address]

## 2021-12-28 NOTE — Anesthesia Postprocedure Evaluation (Addendum)
Anesthesia Post Note ? ?Patient: Reginald Ayers ? ?Procedure(s) Performed: Right Transcarotid Artery Revascularization (Right) ?ULTRASOUND GUIDANCE FOR VASCULAR ACCESS, LEFT FEMORAL VEIN (Left: Groin) ? ?  ? ?Patient location during evaluation: PACU ?Anesthesia Type: General ?Level of consciousness: awake and alert ?Pain management: pain level controlled ?Vital Signs Assessment: post-procedure vital signs reviewed and stable ?Respiratory status: spontaneous breathing, nonlabored ventilation, respiratory function stable and patient connected to nasal cannula oxygen ?Cardiovascular status: blood pressure returned to baseline and stable ?Postop Assessment: no apparent nausea or vomiting ?Anesthetic complications: no ?Comments: Code stroke called in PACU due to neglect on L and minimal movement of the LUE. CT perfromed. Per Radiology MD, no acute changes, will monitor with q1h neuro checks.  ? ? ?No notable events documented. ? ?  ?  ?  ?  ?  ?  ? ?Effie Berkshire ? ? ? ? ?

## 2021-12-28 NOTE — Progress Notes (Signed)
? ? ?  Subjective  - POD #2, s/p right TCAR ? ?SLP eval yesterday, recommend dysphagia diet ?Want to dgo home ? ? ?Physical Exam: ? ?No weakness ?Right neck incision looks good ? ? ? ? ? ? ?Assessment/Plan:  POD #2 ? ?ASA/Plavix for one month then d/c palvix.  Also on Eliquis ?Continue statin ?Dispo planning ? ?Wells Brooklynne Pereida ?12/28/2021 ?8:26 AM ?-- ? ?Vitals:  ? 12/28/21 0752 12/28/21 0800  ?BP:  135/61  ?Pulse:  (!) 108  ?Resp: (!) 26 18  ?Temp: 97.7 ?F (36.5 ?C)   ?SpO2: 96% 97%  ? ? ?Intake/Output Summary (Last 24 hours) at 12/28/2021 0826 ?Last data filed at 12/28/2021 0715 ?Gross per 24 hour  ?Intake 722.75 ml  ?Output 1875 ml  ?Net -1152.25 ml  ? ? ? ?Laboratory ?CBC ?   ?Component Value Date/Time  ? WBC 9.8 12/28/2021 0133  ? HGB 9.0 (L) 12/28/2021 0133  ? HCT 28.6 (L) 12/28/2021 0133  ? PLT 193 12/28/2021 0133  ? ? ?BMET ?   ?Component Value Date/Time  ? NA 142 12/28/2021 0133  ? K 3.6 12/28/2021 0133  ? CL 107 12/28/2021 0133  ? CO2 24 12/28/2021 0133  ? GLUCOSE 231 (H) 12/28/2021 0133  ? BUN 30 (H) 12/28/2021 0133  ? CREATININE 1.60 (H) 12/28/2021 0133  ? CALCIUM 7.8 (L) 12/28/2021 0133  ? GFRNONAA 45 (L) 12/28/2021 0133  ? GFRAA >60 09/25/2017 2016  ? ? ?COAG ?Lab Results  ?Component Value Date  ? INR 1.5 10/26/2008  ? INR 1.1 10/24/2008  ? ?No results found for: PTT ? ?Antibiotics ?Anti-infectives (From admission, onward)  ? ? Start     Dose/Rate Route Frequency Ordered Stop  ? 12/27/21 0600  ceFAZolin (ANCEF) IVPB 2g/100 mL premix       ? 2 g ?200 mL/hr over 30 Minutes Intravenous On call to O.R. 12/26/21 1218 12/26/21 1352  ? 12/21/21 1200  cefTRIAXone (ROCEPHIN) 2 g in sodium chloride 0.9 % 100 mL IVPB       ? 2 g ?200 mL/hr over 30 Minutes Intravenous Every 24 hours 12/20/21 1444 12/24/21 1430  ? 12/20/21 1500  cefTRIAXone (ROCEPHIN) 1 g in sodium chloride 0.9 % 100 mL IVPB       ?Note to Pharmacy: Was supposed to be given 2 g but ED is order set only gives 1 g  ? 1 g ?200 mL/hr over 30 Minutes Intravenous   Once 12/20/21 1443 12/20/21 2112  ? 12/20/21 1415  cefTRIAXone (ROCEPHIN) 1 g in sodium chloride 0.9 % 100 mL IVPB       ? 1 g ?200 mL/hr over 30 Minutes Intravenous  Once 12/20/21 1409 12/20/21 1556  ? 12/20/21 1415  azithromycin (ZITHROMAX) 500 mg in sodium chloride 0.9 % 250 mL IVPB  Status:  Discontinued       ? 500 mg ?250 mL/hr over 60 Minutes Intravenous Every 24 hours 12/20/21 1409 12/23/21 1214  ? ?  ? ? ? ?V. Charlena Cross, M.D., FACS ?[facility] ?Office: [phone] ?Pager:  [phone]  ?

## 2021-12-28 NOTE — Plan of Care (Signed)
?  Problem: Clinical Measurements: ?Goal: Ability to maintain clinical measurements within normal limits will improve ?Outcome: Progressing ?Goal: Will remain free from infection ?Outcome: Progressing ?Goal: Diagnostic test results will improve ?Outcome: Progressing ?Goal: Respiratory complications will improve ?Outcome: Progressing ?Goal: Cardiovascular complication will be avoided ?Outcome: Progressing ?  ?Problem: Activity: ?Goal: Risk for activity intolerance will decrease ?Outcome: Progressing ?  ?Problem: Nutrition: ?Goal: Adequate nutrition will be maintained ?Outcome: Progressing ?  ?Problem: Coping: ?Goal: Level of anxiety will decrease ?Outcome: Progressing ?  ?Problem: Elimination: ?Goal: Will not experience complications related to bowel motility ?Outcome: Progressing ?Goal: Will not experience complications related to urinary retention ?Outcome: Progressing ?  ?Problem: Pain Managment: ?Goal: General experience of comfort will improve ?Outcome: Progressing ?  ?Problem: Safety: ?Goal: Ability to remain free from injury will improve ?Outcome: Progressing ?  ?Problem: Skin Integrity: ?Goal: Risk for impaired skin integrity will decrease ?Outcome: Progressing ?  ?Problem: Activity: ?Goal: Ability to tolerate increased activity will improve ?Outcome: Completed/Met ?  ?Problem: Respiratory: ?Goal: Ability to maintain a clear airway and adequate ventilation will improve ?Outcome: Completed/Met ?  ?Problem: Role Relationship: ?Goal: Method of communication will improve ?Outcome: Completed/Met ?  ?

## 2021-12-29 ENCOUNTER — Encounter (HOSPITAL_COMMUNITY): Payer: Self-pay | Admitting: Surgery

## 2021-12-29 ENCOUNTER — Inpatient Hospital Stay (HOSPITAL_COMMUNITY): Payer: Medicare Other

## 2021-12-29 ENCOUNTER — Other Ambulatory Visit (HOSPITAL_COMMUNITY): Payer: Self-pay

## 2021-12-29 DIAGNOSIS — J189 Pneumonia, unspecified organism: Secondary | ICD-10-CM | POA: Diagnosis not present

## 2021-12-29 DIAGNOSIS — J9601 Acute respiratory failure with hypoxia: Secondary | ICD-10-CM | POA: Diagnosis not present

## 2021-12-29 DIAGNOSIS — N179 Acute kidney failure, unspecified: Secondary | ICD-10-CM | POA: Diagnosis not present

## 2021-12-29 DIAGNOSIS — I63132 Cerebral infarction due to embolism of left carotid artery: Secondary | ICD-10-CM | POA: Diagnosis not present

## 2021-12-29 LAB — GLUCOSE, CAPILLARY
Glucose-Capillary: 273 mg/dL — ABNORMAL HIGH (ref 70–99)
Glucose-Capillary: 179 mg/dL — ABNORMAL HIGH (ref 70–99)
Glucose-Capillary: 218 mg/dL — ABNORMAL HIGH (ref 70–99)
Glucose-Capillary: 236 mg/dL — ABNORMAL HIGH (ref 70–99)
Glucose-Capillary: 211 mg/dL — ABNORMAL HIGH (ref 70–99)

## 2021-12-29 LAB — MAGNESIUM: Magnesium: 1.8 mg/dL (ref 1.7–2.4)

## 2021-12-29 LAB — BASIC METABOLIC PANEL
Anion gap: 6 (ref 5–15)
BUN: 32 mg/dL — ABNORMAL HIGH (ref 8–23)
CO2: 23 mmol/L (ref 22–32)
Calcium: 7.7 mg/dL — ABNORMAL LOW (ref 8.9–10.3)
Chloride: 108 mmol/L (ref 98–111)
Creatinine, Ser: 1.45 mg/dL — ABNORMAL HIGH (ref 0.61–1.24)
GFR, Estimated: 51 mL/min — ABNORMAL LOW (ref >60)
Glucose, Bld: 289 mg/dL — ABNORMAL HIGH (ref 70–99)
Potassium: 3.8 mmol/L (ref 3.5–5.1)
Sodium: 137 mmol/L (ref 135–145)

## 2021-12-29 LAB — BRAIN NATRIURETIC PEPTIDE: B Natriuretic Peptide: 493.9 pg/mL — ABNORMAL HIGH (ref 0.0–100.0)

## 2021-12-29 LAB — CBC
HCT: 29.8 % — ABNORMAL LOW (ref 39.0–52.0)
Hemoglobin: 9.3 g/dL — ABNORMAL LOW (ref 13.0–17.0)
MCH: 29.6 pg (ref 26.0–34.0)
MCHC: 31.2 g/dL (ref 30.0–36.0)
MCV: 94.9 fL (ref 80.0–100.0)
Platelets: 220 10*3/uL (ref 150–400)
RBC: 3.14 MIL/uL — ABNORMAL LOW (ref 4.22–5.81)
RDW: 14.3 % (ref 11.5–15.5)
WBC: 11.9 10*3/uL — ABNORMAL HIGH (ref 4.0–10.5)
nRBC: 0 % (ref 0.0–0.2)

## 2021-12-29 LAB — TSH: TSH: 3.805 u[IU]/mL (ref 0.350–4.500)

## 2021-12-29 IMAGING — DX DG CHEST 1V PORT
1 series · 1 of 1 positions shown · non-contrast
Comparison: [DATE].

CLINICAL DATA: Shortness of breath and emphysema.

EXAM:
PORTABLE CHEST 1 VIEW

[chest ap]
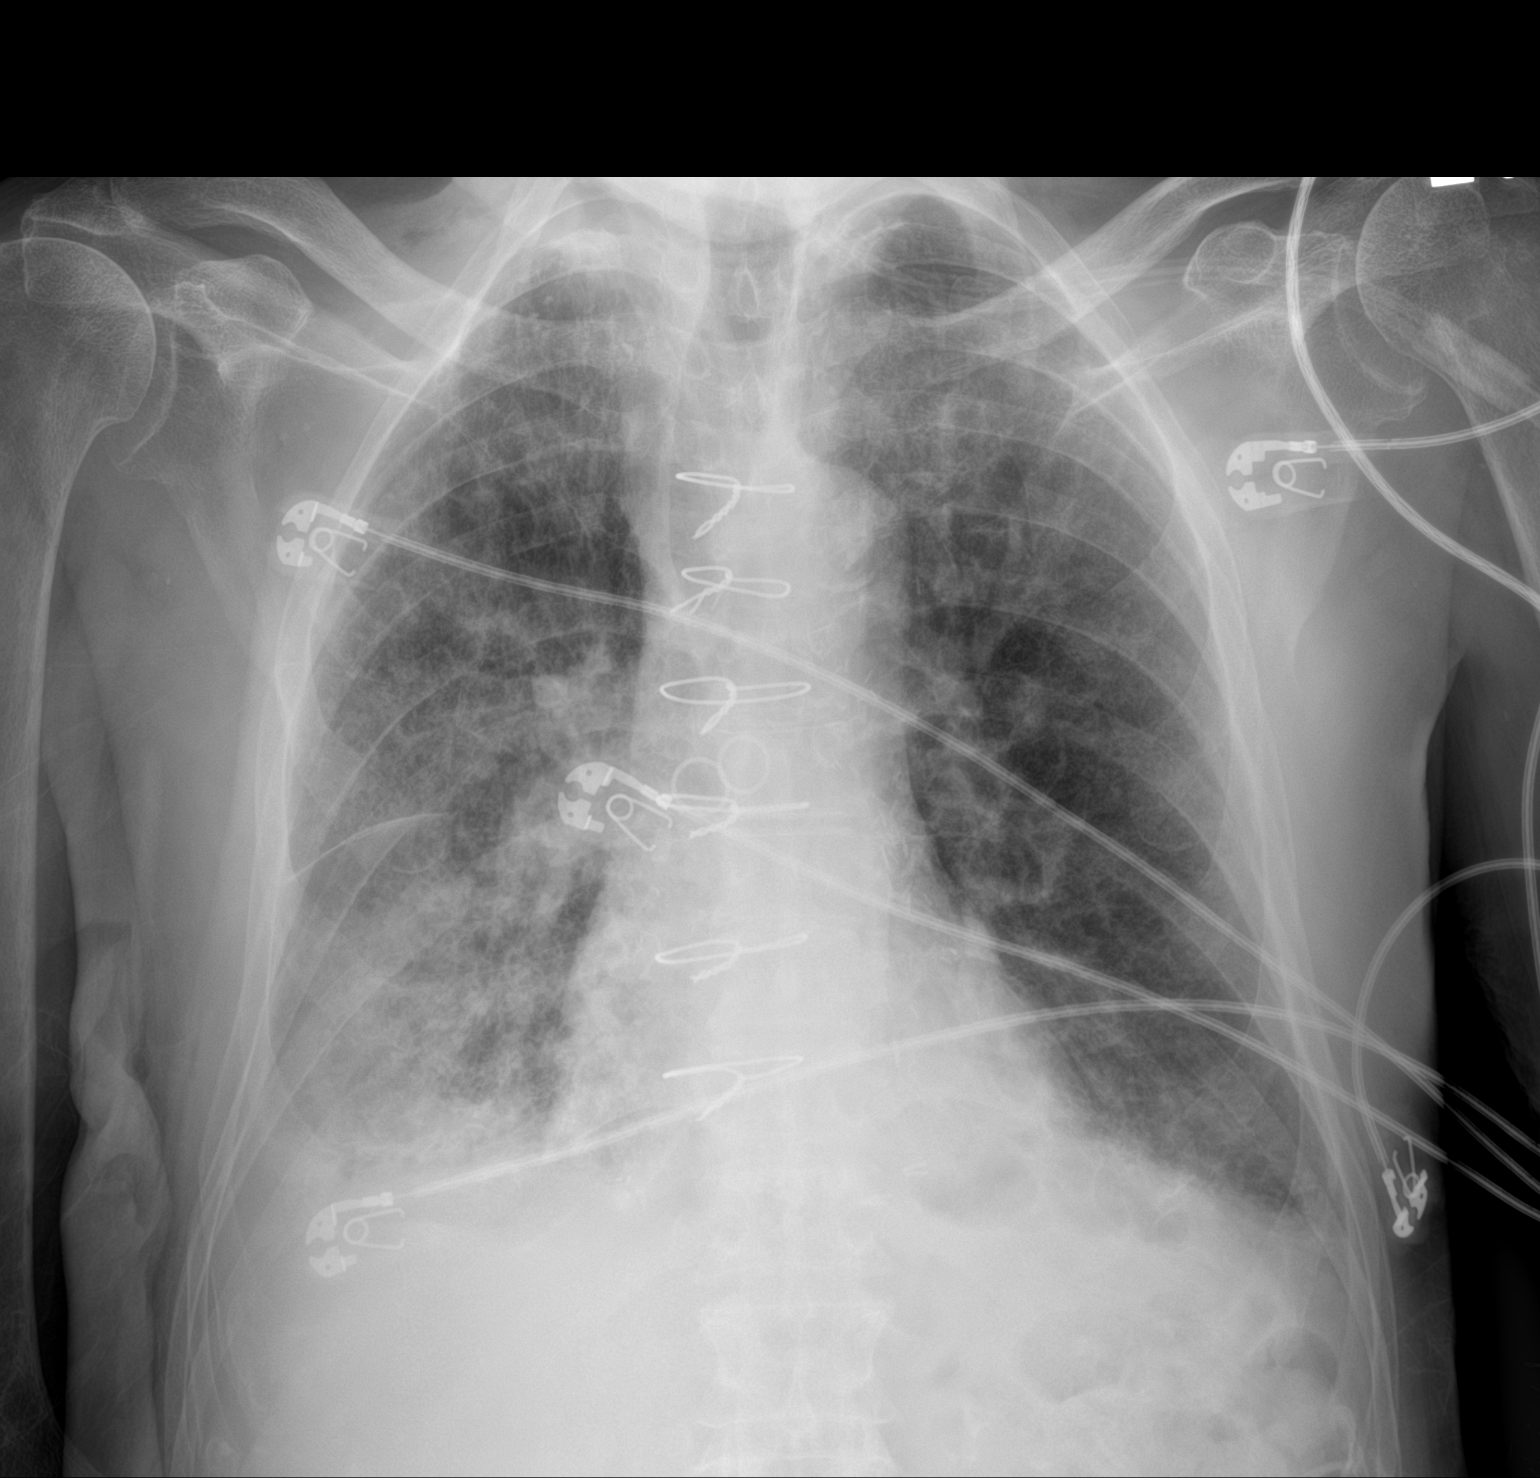

[1 of 1 positions shown; findings below may reference images not displayed]

FINDINGS: [DATE] a.m., [DATE]. The cardiac size is normal. There are old
CABG changes and aortic atherosclerosis. Stable mediastinum.

Interval extubation, removal NGT. There is increased central
vascular prominence which could be congestive or from fluid
overload.

There are increased interstitial and patchy consolidative opacities
in the right mid and lower lung field, increased small right pleural
effusion.

Findings are probably due to multilobar pneumonia with worsening in
the interval.

Left lung demonstrating emphysematous and chronic changes without
new infiltrate. Osteopenia and thoracic spondylosis.
IMPRESSION: 1. Worsening opacities right mid and lower lung field most likely
due to pneumonia.
2. Underlying edema component not excluded due to the also noted
increased central vascular prominence.
3. Increased small right pleural effusion. Stable minimal left
pleural effusion.
4. Interval extubation, removal NGT.

## 2021-12-29 MED ORDER — METOPROLOL TARTRATE 50 MG PO TABS
50.0000 mg | ORAL_TABLET | Freq: Two times a day (BID) | ORAL | Status: DC
Start: 2021-12-29 — End: 2021-12-31
  Administered 2021-12-29 – 2021-12-30 (×4): 50 mg via ORAL
  Filled 2021-12-29 (×4): qty 1

## 2021-12-29 MED ORDER — DIGOXIN 0.25 MG/ML IJ SOLN
0.1250 mg | Freq: Four times a day (QID) | INTRAMUSCULAR | Status: AC
  Administered 2021-12-29 (×2): 0.125 mg via INTRAVENOUS
  Filled 2021-12-29 (×2): qty 2

## 2021-12-29 MED ORDER — FERROUS SULFATE 325 (65 FE) MG PO TABS
325.0000 mg | ORAL_TABLET | Freq: Every day | ORAL | Status: DC
  Administered 2021-12-29 – 2021-12-31 (×3): 325 mg via ORAL
  Filled 2021-12-29 (×3): qty 1

## 2021-12-29 MED ORDER — FUROSEMIDE 40 MG PO TABS
40.0000 mg | ORAL_TABLET | Freq: Once | ORAL | Status: AC
  Administered 2021-12-29: 40 mg via ORAL
  Filled 2021-12-29: qty 1

## 2021-12-29 MED ORDER — INSULIN GLARGINE-YFGN 100 UNIT/ML ~~LOC~~ SOLN
15.0000 [IU] | Freq: Every day | SUBCUTANEOUS | Status: DC
  Administered 2021-12-29 – 2021-12-31 (×3): 15 [IU] via SUBCUTANEOUS
  Filled 2021-12-29 (×4): qty 0.15

## 2021-12-29 MED ORDER — METOPROLOL TARTRATE 5 MG/5ML IV SOLN
5.0000 mg | INTRAVENOUS | Status: DC | PRN
Start: 2021-12-29 — End: 2021-12-31

## 2021-12-29 MED ORDER — EMPAGLIFLOZIN 25 MG PO TABS
25.0000 mg | ORAL_TABLET | Freq: Every day | ORAL | Status: DC
  Administered 2021-12-29 – 2021-12-31 (×3): 25 mg via ORAL
  Filled 2021-12-29 (×3): qty 1

## 2021-12-29 NOTE — Progress Notes (Addendum)
?  Progress Note ? ? ? ?12/29/2021 ?7:17 AM ?3 Days Post-Op ? ?Subjective:  wants to go home ? ?afebrile ? ?Vitals:  ? 12/29/21 0400 12/29/21 0600  ?BP: (!) 120/53 137/66  ?Pulse:    ?Resp: (!) 24 17  ?Temp: 98.3 ?F (36.8 ?C)   ?SpO2: 94% 92%  ? ? ?Physical Exam: ?General:  sitting in chair-appears comfortable ?Lungs:  non labored on O2 ?Incisions:  right neck incision is clean and dry ?Neuro:  moving all extremities equally with equal hand grips; tongue is midline.   ? ?CBC ?   ?Component Value Date/Time  ? WBC 11.9 (H) 12/29/2021 0101  ? RBC 3.14 (L) 12/29/2021 0101  ? HGB 9.3 (L) 12/29/2021 0101  ? HCT 29.8 (L) 12/29/2021 0101  ? PLT 220 12/29/2021 0101  ? MCV 94.9 12/29/2021 0101  ? MCH 29.6 12/29/2021 0101  ? MCHC 31.2 12/29/2021 0101  ? RDW 14.3 12/29/2021 0101  ? LYMPHSABS 0.7 12/23/2021 0459  ? MONOABS 0.5 12/23/2021 0459  ? EOSABS 0.0 12/23/2021 0459  ? BASOSABS 0.0 12/23/2021 0459  ? ? ?BMET ?   ?Component Value Date/Time  ? NA 137 12/29/2021 0101  ? K 3.8 12/29/2021 0101  ? CL 108 12/29/2021 0101  ? CO2 23 12/29/2021 0101  ? GLUCOSE 289 (H) 12/29/2021 0101  ? BUN 32 (H) 12/29/2021 0101  ? CREATININE 1.45 (H) 12/29/2021 0101  ? CALCIUM 7.7 (L) 12/29/2021 0101  ? GFRNONAA 51 (L) 12/29/2021 0101  ? GFRAA >60 09/25/2017 2016  ? ? ?INR ?   ?Component Value Date/Time  ? INR 1.5 10/26/2008 1500  ? ? ? ?Intake/Output Summary (Last 24 hours) at 12/29/2021 0717 ?Last data filed at 12/29/2021 0600 ?Gross per 24 hour  ?Intake --  ?Output 800 ml  ?Net -800 ml  ? ? ? ?Assessment/Plan:  71 y.o. male is s/p:  ?Right TCAR with post procedure left sided weakness now resolved.  ?3 Days Post-Op ? ? ?-neuro exam in tact, incision looks good and doing well from vascular standpoint.  ?-still requiring O2 ?-asa/plavix/eliquis.  Plavix for one month then can discontinue.   ?-disposition per primary team ?-f/u in one month with carotid duplex-our office will arrange appt. ? ? ?Doreatha Massed, PA-C ?[facility] ?[phone] ?12/29/2021 ?7:17 AM ? ? ?I agree with the above.  No neurologic issues.  Safe for discharge from vascular perspective.  He will continue aspirin Plavix and Eliquis for 1 month as triple therapy, and then Plavix can be discontinued.  Continue statin therapy. ? ?[name] ?

## 2021-12-29 NOTE — TOC Benefit Eligibility Note (Signed)
Patient Advocate Encounter ?  ?Insurance verification completed.   ?  ?The patient is currently admitted and upon discharge could be taking ELIQUIS 5 MG. ?  ?The current 30 day co-pay is, $50.  ? ?The patient is insured through [employer]. ? ? ?   ?

## 2021-12-29 NOTE — Progress Notes (Signed)
Physical Therapy Treatment ?Patient Details ?Name: Reginald Ayers ?MRN: [id number] ?DOB: 14-Apr-1951 ?Today's Date: 12/29/2021 ? ? ?History of Present Illness The pt is a 71 yo male presenting 4/29 with increasing shortness of breath and required intubation and mechanical ventilation for respiratory failure. MRI shows left ICA with poor flow and evidence of reconstitued flow at L ICA terminus.  History of bilateral carotid artery stenosis (total occlusion of the left, moderate stenosis of the right), HTN, HLD, prior tobacco abuse, diabetes, coronary artery disease s/p CABG, peripheral vascular disease s/p stents.  Pt extubated on 5/2.  CTA (+) for L ICA occlusion, R ICA severe stenosis. Right carotid stent (TCAR) 5/5. ? ?  ?PT Comments  ? ? Pt making good progress towards his physical therapy goals this session; eager to get out of bed. Pt requiring min assist for bed mobility, min guard for transfers/ambulation. Pt ambulating 20 ft with a walker, with shuffling gait pattern. Requiring 2-4L O2. Pt displays weakness, impaired balance and decreased activity tolerance. Pt vocalizing desire to go home before birthday on Sunday. Pt niece present and states he will have 24/7 assist between herself, pt roommate and roommate girlfriend. Pt niece states she was the caregiver for her mother and pt roommate girlfriend is a CNA who feels comfortable with assisting with pt ADL's. Will continue to follow acutely to progress mobility as tolerated.  ?   ?Recommendations for follow up therapy are one component of a multi-disciplinary discharge planning process, led by the attending physician.  Recommendations may be updated based on patient status, additional functional criteria and insurance authorization. ? ?Follow Up Recommendations ? Home health PT (pt refusing SNF) ?  ?  ?Assistance Recommended at Discharge Frequent or constant Supervision/Assistance  ?Patient can return home with the following A little help with walking and/or  transfers;A little help with bathing/dressing/bathroom;Assistance with cooking/housework;Direct supervision/assist for medications management;Assist for transportation;Help with stairs or ramp for entrance ?  ?Equipment Recommendations ? BSC/3in1;Wheelchair (measurements PT);Wheelchair cushion (measurements PT)  ?  ?Recommendations for Other Services   ? ? ?  ?Precautions / Restrictions Precautions ?Precautions: Fall;Other (comment) ?Precaution Comments: watch O2 ?Restrictions ?Weight Bearing Restrictions: No  ?  ? ?Mobility ? Bed Mobility ?Overal bed mobility: Needs Assistance ?Bed Mobility: Supine to Sit, Sit to Supine ?  ?  ?Supine to sit: Min assist ?Sit to supine: Min assist ?  ?General bed mobility comments: Pt bringing BLE's off edge of bed, assist to execute trunk to upright sitting. Assist for LE management back into bed ?  ? ?Transfers ?Overall transfer level: Needs assistance ?Equipment used: Rolling walker (2 wheels) ?Transfers: Sit to/from Stand ?Sit to Stand: Min guard ?  ?  ?  ?  ?  ?General transfer comment: Cues for hand placement, min guard for safety. Increased time/effort ?  ? ?Ambulation/Gait ?Ambulation/Gait assistance: Min guard ?Gait Distance (Feet): 20 Feet ?Assistive device: Rolling walker (2 wheels) ?Gait Pattern/deviations: Step-through pattern, Decreased stride length, Trunk flexed ?Gait velocity: decreased ?Gait velocity interpretation: <1.8 ft/sec, indicate of risk for recurrent falls ?  ?General Gait Details: Slow pace, decreased bilateral foot clearance, min guard for safety. ? ? ?Stairs ?  ?  ?  ?  ?  ? ? ?Wheelchair Mobility ?  ? ?Modified Rankin (Stroke Patients Only) ?Modified Rankin (Stroke Patients Only) ?Pre-Morbid Rankin Score: Slight disability ?Modified Rankin: Moderately severe disability ? ? ?  ?Balance Overall balance assessment: Needs assistance ?Sitting-balance support: Feet supported ?Sitting balance-Leahy Scale: Fair ?  ?  ?Standing  balance support: Bilateral upper  extremity supported ?Standing balance-Leahy Scale: Poor ?Standing balance comment: reliant on external support ?  ?  ?  ?  ?  ?  ?  ?  ?  ?  ?  ?  ? ?  ?Cognition Arousal/Alertness: Awake/alert ?Behavior During Therapy: [facility] for tasks assessed/performed ?Overall Cognitive Status: Impaired/Different from baseline ?Area of Impairment: Safety/judgement ?  ?  ?  ?  ?  ?  ?  ?  ?  ?  ?  ?  ?Safety/Judgement: Decreased awareness of deficits ?  ?  ?General Comments: Pt interactive, very focused on wanting to get home before birthday and to watch his "eagles," on the computer ?  ?  ? ?  ?Exercises   ? ?  ?General Comments   ?  ?  ? ?Pertinent Vitals/Pain Pain Assessment ?Pain Assessment: Faces ?Faces Pain Scale: No hurt  ? ? ?Home Living   ?  ?  ?  ?  ?  ?  ?  ?  ?  ?   ?  ?Prior Function    ?  ?  ?   ? ?PT Goals (current goals can now be found in the care plan section) Acute Rehab PT Goals ?Patient Stated Goal: go home ?Potential to Achieve Goals: Fair ?Progress towards PT goals: Progressing toward goals ? ?  ?Frequency ? ? ? Min 3X/week ? ? ? ?  ?PT Plan Discharge plan needs to be updated  ? ? ?Co-evaluation   ?  ?  ?  ?  ? ?  ?AM-PAC PT "6 Clicks" Mobility   ?Outcome Measure ? Help needed turning from your back to your side while in a flat bed without using bedrails?: A Little ?Help needed moving from lying on your back to sitting on the side of a flat bed without using bedrails?: A Little ?Help needed moving to and from a bed to a chair (including a wheelchair)?: A Little ?Help needed standing up from a chair using your arms (e.g., wheelchair or bedside chair)?: A Little ?Help needed to walk in hospital room?: A Little ?Help needed climbing 3-5 steps with a railing? : Total ?6 Click Score: 16 ? ?  ?End of Session Equipment Utilized During Treatment: Gait belt;Oxygen ?Activity Tolerance: Patient tolerated treatment well ?Patient left: in bed;with call bell/phone within reach;with family/visitor present ?Nurse  Communication: Mobility status ?PT Visit Diagnosis: Unsteadiness on feet (R26.81);Muscle weakness (generalized) (M62.81);Other symptoms and signs involving the nervous system (R29.898) ?  ? ? ?Time: [id number] ?PT Time Calculation (min) (ACUTE ONLY): 37 min ? ?Charges:  $Therapeutic Activity: 23-37 mins          ?          ? ?Wyona Almas, PT, DPT ?Acute Rehabilitation Services ?Pager [phone] ?Office [phone] ? ? ? ?Reginald Ayers ?12/29/2021, 4:57 PM ? ?

## 2021-12-29 NOTE — Plan of Care (Signed)
?  Problem: Clinical Measurements: ?Goal: Ability to maintain clinical measurements within normal limits will improve ?Outcome: Progressing ?  ?Problem: Nutrition: ?Goal: Adequate nutrition will be maintained ?Outcome: Progressing ?  ?Problem: Activity: ?Goal: Risk for activity intolerance will decrease ?Outcome: Progressing ?  ?Problem: Pain Managment: ?Goal: General experience of comfort will improve ?Outcome: Progressing ?  ?

## 2021-12-29 NOTE — Progress Notes (Signed)
Modified Barium Swallow Progress Note ? ?Patient Details  ?Name: Reginald Ayers ?MRN: [id number] ?Date of Birth: June 24, 1951 ? ?Today's Date: 12/29/2021 ? ?Modified Barium Swallow completed.  Full report located under Chart Review in the Imaging Section. ? ?Brief recommendations include the following: ? ?Clinical Impression ? Pt demonstrates minimal changes in swallowing related to age. There is some trace to mild lingual, base of tongue and pharyngeal residue likely due to changes in mucosal tension. No significant residue or weakness. There was one instance of flash penetration with thin that did elicit coughing. An esophageal sweep showed stasis with soldis that cleared with a liquid wash. THis may account for delayed coughing. Pt advised to follow pills with a bite of puree. He does not have dentition and needs fairly soft foods. Today he struggled a bit with dys 3 vegetables. Will try dys 2 (fine chop/minced) with thin liquids. ?  ?Swallow Evaluation Recommendations ? ?   ? ? SLP Diet Recommendations: Dysphagia 2 (Fine chop) solids;Thin liquid ? ? Liquid Administration via: Cup;Straw ? ? Medication Administration: Whole meds with puree ? ? Supervision: Patient able to self feed ? ? Compensations: Slow rate;Small sips/bites;Follow solids with liquid ? ? Postural Changes: Remain semi-upright after after feeds/meals (Comment) ? ? Oral Care Recommendations: Oral care BID ? ?   ? ? ? ?Khalee Mazo, Riley Nearing ?12/29/2021,2:40 PM ?

## 2021-12-29 NOTE — Progress Notes (Signed)
Speech Language Pathology Treatment: Dysphagia  ?Patient Details ?Name: Reginald Ayers ?MRN: [id number] ?DOB: [birth year] ?Today's Date: 12/29/2021 ?Time: 0910-0920 ?SLP Time Calculation (min) (ACUTE ONLY): 10 min ? ?Assessment / Plan / Recommendation ?Clinical Impression ? Pt alert, pleasant, states he's ready to go home. Pt demonstrates no immediate signs of aspiration with thin liquids consumed by straw today. Also trialed a graham cracker as pt typically consumes solid foods without dentures present. Pt able to masticate and used a liquid wash. Several minutes after session complete pt observed to have some productive coughing from outside the room. Given right sided infiltrates on admission, instrumental assessment may be warranted prior to d/c. Will upgrade diet but also plan for MBS.   ?HPI HPI: 71 yo male presented 4/29 with sepsis secondary to CAP, requiring intubation and mechanical ventilation for respiratory failure. ETT 4/29-5/2. MRI: punctate left temporal infarct; chronic cortical infarcts in the left middle and superior frontal gyri.  History of bilateral carotid artery stenosis (total occlusion of the left, moderate stenosis of the right), HTN, HLD, prior tobacco abuse, diabetes, coronary artery disease s/p CABG, peripheral vascular disease s/p stents. Follow up for dysphagia management indicated. Right carotid stent placed 12/26/2021. Post-op with left hemiparesis; MRI no acute changes; resolved weakness. ?  ?   ?SLP Plan ? Continue with current plan of care ? ?  ?  ?Recommendations for follow up therapy are one component of a multi-disciplinary discharge planning process, led by the attending physician.  Recommendations may be updated based on patient status, additional functional criteria and insurance authorization. ?  ? ?Recommendations  ?Diet recommendations: Dysphagia 3 (mechanical soft);Thin liquid ?Liquids provided via: Cup;Straw ?Medication Administration: Whole meds with puree ?Supervision:  Patient able to self feed;Full supervision/cueing for compensatory strategies ?Compensations: Small sips/bites;Slow rate;Minimize environmental distractions ?Postural Changes and/or Swallow Maneuvers: Seated upright 90 degrees;Upright 30-60 min after meal  ?   ?    ?   ? ? ? ? Oral Care Recommendations: Oral care BID ?Follow Up Recommendations: Other (comment) ?Assistance recommended at discharge: Frequent or constant Supervision/Assistance ?Plan: Continue with current plan of care ? ? ? ? ?  ?  ? ? ?Ki Corbo, Riley Nearing ? ?12/29/2021, 9:28 AM ?

## 2021-12-29 NOTE — Progress Notes (Signed)
?      ?                 PROGRESS NOTE ? ?      ?PATIENT DETAILS ?Name: Reginald Ayers ?Age: 71 y.o. ?Sex: male ?Date of Birth: October 27, 1950 ?Admit Date: 12/20/2021 ?Admitting Physician Kipp Brood, MD ?TKW:[id number], Jori Moll, MD ? ?Brief Summary: ?Patient is a 71 y.o.  male with history of PAD, CAD s/p CABG, HTN, HLD, COPD-who presented with weakness/shortness of breath-he was found to have acute hypoxic respiratory failure due to multifocal pneumonia-was intubated in the emergency room and subsequently admitted to the ICU by PCCM. Upon further work-up in ICU-patient was found to Sub acute CVA &  80% stenosis of right ICA-underwent s/p right carotid stenting on 5/5-following which patient developed transient left-sided weakness likely due to hypoperfusion & New acute CVA.  He also had A-fib RVR during perioperative period,  See below for further details.  Transferred to my service on 12/28/2021 ? ?Significant events: ?4/29>>Intubated and admitted to the ICU. >> A fib and elevated troponins ?4/30>> MRI with acute CVA. ?5/02>> extubated  ?5/04>> transfer to [facility]. ?5/05>> TCAR-postoperatively-left hemiparesis-code stroke-transfer to ICU.  Repeat MRI brain-without acute CVA.   ?5/06>> postoperative left-sided weakness resolved-was likely due to hypoperfusion ? ?Significant studies: ?4/29>> CT head: No acute intracranial abnormality. ?4/29>> CXR: Right lower lobe airspace consolidation. ?4/29>> CT chest/abdomen/pelvis: Multifocal PNA ?4/30>> A1c: 8.1 ?4/30>> LDL: 21 LDL of 20 ?4/30>> MRI brain: Acute ischemia left temporal lobe, left ICA occlusion. ?5/01>> Echo: EF 50-55%, dyskinesis of left ventricular apical segment. ?5/04>> CTA neck:> 80% stenosis of right ICA, left ICA occluded. ?5/04>> CTA head: Left ICA occluded at the precavernous/cavernous segments-reconstitution within the paraclinoid and supraclinoid ICA.  No other large LVO. ?5/05>> repeat MRI brain: No acute CVA ?5/05>> repeat CTA head/neck: Stenting of right  ICA-unchanged occlusion of left ICA-no other hemodynamically significant stenosis. ? ? ?Significant microbiology data: ?4/29>> COVID/influenza PCR: Negative ?4/29>> blood culture: No growth ?4/30>> trach aspirate: Normal respiratory flora ? ?Procedures: ?4/29>> 5/2:ETT: ?5/05>> TCAR/right carotid stent ? ?Consults: ?PCCM, neurology, vascular surgery, cardiology ? ?Subjective: Patient in bed, appears comfortable, denies any headache, no fever, no chest pain or pressure, mild cough and mild shortness of breath , no abdominal pain. No new focal weakness. ? ? ?Objective: ?Vitals: ?Blood pressure 137/66, pulse (!) 110, temperature 97.6 ?F (36.4 ?C), temperature source Axillary, resp. rate 17, height '5\' 1"'  (1.549 m), weight 49.4 kg, SpO2 92 %.  ? ?Exam: ? ?Awake Alert, No new F.N deficits, Normal affect ?[place].AT,PERRAL ?Supple Neck, No JVD,   ?Symmetrical Chest wall movement, Good air movement bilaterally, coarse Bilat B.sounds ?RRR,No Gallops, Rubs or new Murmurs,  ?+ve B.Sounds, Abd Soft, No tenderness,   ?No Cyanosis, Clubbing or edema  ? ? ?Assessment/Plan: ? ?Severe sepsis due to PNA - Acute hypoxic respiratory failure - Sepsis physiology has resolved-hypoxia stable on 2-3 L of oxygen.  All cultures negative to date-completed course of antibiotics.  Continue close follow-up by speech encouraged to use I-S and flutter valve for pulmonary toiletry ? ?Sub Acute L. Temporal lobe CVA upon presentation, R. Carotid TCAR with peri op R. MCA hypoperfusion / R. MCA small Infarct and L sides weakness. Unclear whether this is a embolic etiology from A-fib or from hypoperfusion in the setting of left ICA occlusion.  Work-up as above-on ASA/Plavix/statin, along with Eliquis as above.  Per vascular surgery after 30 days stop Plavix and continue aspirin Eliquis and statin.  Discussed with  stroke team on 12/28/2021 as well. ? ?Left ICA occlusion, More than 80% right ICA stenosis s/p TCAR on 5/5-vascular surgery following, currently on  combination of statin, aspirin, Plavix and Eliquis.  After 30 days discontinue Plavix. ? ?PAF with RVR with [place] vas 2 score of greater than 5 - Maintaining sinus rhythm-A-fib likely triggered by PNA/hypoxemia.  Echo stable.  Recent TSH February stable.  Now on Eliquis, increased beta-blocker dose on 12/29/2021 for better rate control.  2 doses of digoxin as well. ? ?Acute metabolic encephalopathy: 2/2 sepsis/hypoxia-improved-relatively awake and alert ? ?AKI on CKD stage 3a: Likely hemodynamically mediated-baseline creatinine recently seems to be around 1.5.  Monitor closely. ? ?Elevated troponins: Likely demand ischemia-echo with stable EF.  Cardiology consulted. ? ?History of CAD s/p CABG 2010 - On antiplatelet/beta-blocker-previously not on statin due to myalgias-but seems to be tolerating Crestor well. ? ?History of PAD s/p left common iliac/right SFA stenting - Continue antiplatelets/statin. ? ?Normocytic anemia: Due to acute illness-hemoglobin down to 7.0 today-no obvious GI bleeding apparent.  Given drop in hemoglobin/CAD/CVA-need for dual antiplatelet agent/anticoagulation-we will transfuse 1 unit of PRBC. ? ?COPD Continue bronchodilators-not in exacerbation ? ?DM-2: CBGs running high added Lantus to sliding scale, adjust dose further on 12/29/2021. ? ?Recent Labs  ?  12/28/21 ?1606 12/28/21 ?2115 12/29/21 ?0611  ?GLUCAP 293* 251* 273*  ?  ? ? ?Nutrition Status: ?Nutrition Problem: Severe Malnutrition ?Etiology: chronic illness (COPD, CAD) ?Signs/Symptoms: severe muscle depletion, severe fat depletion, percent weight loss (13% weight loss within 3 months) ?Percent weight loss: 13 % ?Interventions: Hormel Shake, MVI ? ?Pressure Ulcer: ?Pressure Injury 12/20/21 Buttocks Right;Left Stage 1 -  Intact skin with non-blanchable redness of a localized area usually over a bony prominence. wound is 0.5 cm lenght  by 0.6 cm width (Active)  ?12/20/21 1831  ?Location: Buttocks  ?Location Orientation: Right;Left  ?Staging:  Stage 1 -  Intact skin with non-blanchable redness of a localized area usually over a bony prominence.  ?Wound Description (Comments): wound is 0.5 cm lenght  by 0.6 cm width  ?Present on Admission: Yes  ?Dressing Type None 12/28/21 2000  ? ? ?BMI: ?Estimated body mass index is 20.58 kg/m? as calculated from the following: ?  Height as of this encounter: '5\' 1"'  (1.549 m). ?  Weight as of this encounter: 49.4 kg.  ? ?Code status: ?  Code Status: Full Code  ? ?DVT Prophylaxis: ?apixaban (ELIQUIS) tablet 2.5 mg Start: 12/28/21 2200 IV heparin ? ?Family Communication: None at bedside ? ? ?Disposition Plan: ?Status is: Inpatient ?Remains inpatient appropriate because: Admitted with sepsis from PNA-developed CVA-s/p TCAR of right carotid artery stenosis-developed transient strokelike symptoms from hypoperfusion-needs PRBC transfusion today.  Not stable for discharge-SNF sometime next week. ?  ?Planned Discharge Destination:Skilled nursing facility ? ? ?Diet: ?Diet Order   ? ?       ?  DIET - DYS 1 Room service appropriate? Yes; Fluid consistency: Nectar Thick  Diet effective now       ?  ? ?  ?  ? ?  ?  ? ?MEDICATIONS: ?Scheduled Meds: ? apixaban  2.5 mg Oral BID  ? arformoterol  15 mcg Nebulization BID  ? aspirin EC  81 mg Oral Daily  ? budesonide (PULMICORT) nebulizer solution  0.25 mg Nebulization BID  ? chlorhexidine gluconate (MEDLINE KIT)  15 mL Mouth Rinse BID  ? Chlorhexidine Gluconate Cloth  6 each Topical Daily  ? clopidogrel  75 mg Oral Daily  ? docusate sodium  100 mg Oral BID  ? empagliflozin  25 mg Oral Daily  ? ferrous sulfate  325 mg Oral Q breakfast  ? furosemide  40 mg Oral Once  ? insulin aspart  0-20 Units Subcutaneous TID WC  ? insulin aspart  0-5 Units Subcutaneous QHS  ? insulin aspart  3 Units Subcutaneous TID WC  ? insulin glargine-yfgn  10 Units Subcutaneous Daily  ? mouth rinse  15 mL Mouth Rinse QID  ? metoprolol tartrate  50 mg Oral BID  ? multivitamin with minerals  1 tablet Oral Daily  ?  polyethylene glycol  17 g Oral Daily  ? revefenacin  175 mcg Nebulization Daily  ? rosuvastatin  20 mg Oral Daily  ? sodium chloride flush  3 mL Intravenous Q12H  ? ?Continuous Infusions: ? sodium chloride Stopped

## 2021-12-30 DIAGNOSIS — I63132 Cerebral infarction due to embolism of left carotid artery: Secondary | ICD-10-CM | POA: Diagnosis not present

## 2021-12-30 DIAGNOSIS — J189 Pneumonia, unspecified organism: Secondary | ICD-10-CM | POA: Diagnosis not present

## 2021-12-30 DIAGNOSIS — J9601 Acute respiratory failure with hypoxia: Secondary | ICD-10-CM | POA: Diagnosis not present

## 2021-12-30 DIAGNOSIS — N179 Acute kidney failure, unspecified: Secondary | ICD-10-CM | POA: Diagnosis not present

## 2021-12-30 LAB — BASIC METABOLIC PANEL
Anion gap: 10 (ref 5–15)
BUN: 30 mg/dL — ABNORMAL HIGH (ref 8–23)
CO2: 23 mmol/L (ref 22–32)
Calcium: 8 mg/dL — ABNORMAL LOW (ref 8.9–10.3)
Chloride: 104 mmol/L (ref 98–111)
Creatinine, Ser: 1.39 mg/dL — ABNORMAL HIGH (ref 0.61–1.24)
GFR, Estimated: 54 mL/min — ABNORMAL LOW (ref >60)
Glucose, Bld: 176 mg/dL — ABNORMAL HIGH (ref 70–99)
Potassium: 3.4 mmol/L — ABNORMAL LOW (ref 3.5–5.1)
Sodium: 137 mmol/L (ref 135–145)

## 2021-12-30 LAB — GLUCOSE, CAPILLARY
Glucose-Capillary: 189 mg/dL — ABNORMAL HIGH (ref 70–99)
Glucose-Capillary: 181 mg/dL — ABNORMAL HIGH (ref 70–99)
Glucose-Capillary: 105 mg/dL — ABNORMAL HIGH (ref 70–99)
Glucose-Capillary: 294 mg/dL — ABNORMAL HIGH (ref 70–99)

## 2021-12-30 LAB — CBC
HCT: 30.7 % — ABNORMAL LOW (ref 39.0–52.0)
Hemoglobin: 9.7 g/dL — ABNORMAL LOW (ref 13.0–17.0)
MCH: 29.8 pg (ref 26.0–34.0)
MCHC: 31.6 g/dL (ref 30.0–36.0)
MCV: 94.2 fL (ref 80.0–100.0)
Platelets: 235 10*3/uL (ref 150–400)
RBC: 3.26 MIL/uL — ABNORMAL LOW (ref 4.22–5.81)
RDW: 13.9 % (ref 11.5–15.5)
WBC: 11.2 10*3/uL — ABNORMAL HIGH (ref 4.0–10.5)
nRBC: 0 % (ref 0.0–0.2)

## 2021-12-30 LAB — MAGNESIUM: Magnesium: 1.9 mg/dL (ref 1.7–2.4)

## 2021-12-30 LAB — BRAIN NATRIURETIC PEPTIDE: B Natriuretic Peptide: 345.1 pg/mL — ABNORMAL HIGH (ref 0.0–100.0)

## 2021-12-30 MED ORDER — MAGNESIUM SULFATE IN D5W 1-5 GM/100ML-% IV SOLN
1.0000 g | Freq: Once | INTRAVENOUS | Status: AC
  Administered 2021-12-30: 1 g via INTRAVENOUS
  Filled 2021-12-30: qty 100

## 2021-12-30 MED ORDER — POTASSIUM CHLORIDE CRYS ER 20 MEQ PO TBCR
20.0000 meq | EXTENDED_RELEASE_TABLET | Freq: Once | ORAL | Status: AC
  Administered 2021-12-30: 20 meq via ORAL
  Filled 2021-12-30: qty 1

## 2021-12-30 MED ORDER — FUROSEMIDE 40 MG PO TABS
40.0000 mg | ORAL_TABLET | Freq: Once | ORAL | Status: AC
  Administered 2021-12-30: 40 mg via ORAL
  Filled 2021-12-30: qty 1

## 2021-12-30 MED ORDER — POTASSIUM CHLORIDE CRYS ER 20 MEQ PO TBCR
40.0000 meq | EXTENDED_RELEASE_TABLET | Freq: Once | ORAL | Status: AC
Start: 2021-12-30 — End: 2021-12-30
  Administered 2021-12-30: 40 meq via ORAL
  Filled 2021-12-30: qty 2

## 2021-12-30 MED ORDER — CHLORHEXIDINE GLUCONATE 0.12 % MT SOLN
OROMUCOSAL | Status: AC
  Administered 2021-12-30: 15 mL
  Filled 2021-12-30: qty 15

## 2021-12-30 NOTE — Progress Notes (Signed)
Speech Language Pathology Treatment: Dysphagia  ?Patient Details ?Name: Reginald Ayers ?MRN: [id number] ?DOB: 30-Jun-1951 ?Today's Date: 12/30/2021 ?Time: 1916-6060 ?SLP Time Calculation (min) (ACUTE ONLY): 20 min ? ?Assessment / Plan / Recommendation ?Clinical Impression ? Pt seen up in chair after am meal. He reports he did have a sensation that the meat and eggs stuck in his throat, which made him cough and caused him to expectorate some liquid and applesauce onto his gown. Pt does not have dentition and even dys 2 meats are difficult for him. Pt reports at home he would eat maybe a runny fried egg and a hot dog for breakfast, but is considering relying on a full liquid diet. SLP reinforced the strategies that appeared to help with with esophageal clearance with solids yesterday. Esophageal sweep showed distal stasis after solids, but a liquid wash successfully cleared residue. Pill hesitated but passed with puree. SLP encouraged pt to consider dentures, but to otherwise masticate soft foods well, follow bites with sips and go slowly with meals, eat upright and sleep with bed elevated. Pt able to repeat strategies. SLP informed MD of findings.  ?  ?HPI HPI: 71 yo male presented 4/29 with sepsis secondary to CAP, requiring intubation and mechanical ventilation for respiratory failure. ETT 4/29-5/2. MRI: punctate left temporal infarct; chronic cortical infarcts in the left middle and superior frontal gyri.  History of bilateral carotid artery stenosis (total occlusion of the left, moderate stenosis of the right), HTN, HLD, prior tobacco abuse, diabetes, coronary artery disease s/p CABG, peripheral vascular disease s/p stents. Follow up for dysphagia management indicated. Right carotid stent placed 12/26/2021. Post-op with left hemiparesis; MRI no acute changes; resolved weakness. ?  ?   ?SLP Plan ? Continue with current plan of care ? ?  ?  ?Recommendations for follow up therapy are one component of a multi-disciplinary  discharge planning process, led by the attending physician.  Recommendations may be updated based on patient status, additional functional criteria and insurance authorization. ?  ? ?Recommendations  ?Diet recommendations: Thin liquid;Dysphagia 2 (fine chop) ?Liquids provided via: Cup;Straw ?Medication Administration: Whole meds with puree ?Supervision: Patient able to self feed;Full supervision/cueing for compensatory strategies ?Compensations: Slow rate;Small sips/bites;Follow solids with liquid ?Postural Changes and/or Swallow Maneuvers: Seated upright 90 degrees;Upright 30-60 min after meal  ?   ?    ?   ? ? ? ? Oral Care Recommendations: Oral care BID ?Follow Up Recommendations: Home health SLP ?Assistance recommended at discharge: Set up Supervision/Assistance ?SLP Visit Diagnosis: Dysphagia, unspecified (R13.10) ?Plan: Continue with current plan of care ? ? ? ? ?  ?  ? ? ?Branden Vine, Riley Nearing ? ?12/30/2021, 10:51 AM ?

## 2021-12-30 NOTE — Progress Notes (Signed)
Nutrition Follow-up ? ?DOCUMENTATION CODES:  ? ?Severe malnutrition in context of chronic illness ? ?INTERVENTION:  ? ?If able to tolerate current po diet, ?Continue Vital Cuisine Shake TID with meals, each supplement provides 520 kcal and 22 grams of protein. ? ?If pt unable to swallow, recommend enteral/tube feeding, ?Recommend Osmolite 1.2 cal @ 20 ml/hr and increase by 10 ml every 4 hours to goal rate of 60 ml/hr.  ? ?Tube feeding regimen provides 1728 kcal (100% of needs), 80 grams of protein, and 1181 ml of H2O.  ? ?NUTRITION DIAGNOSIS:  ? ?Severe Malnutrition related to chronic illness (COPD, CAD) as evidenced by severe muscle depletion, severe fat depletion, percent weight loss (13% weight loss within 3 months); ongoing ? ?GOAL:  ? ?Patient will meet greater than or equal to 90% of their needs; not met ? ?MONITOR:  ? ?PO intake, Supplement acceptance, Diet advancement, Labs, Skin ? ?REASON FOR ASSESSMENT:  ? ?Ventilator, Consult ?Enteral/tube feeding initiation and management ? ?ASSESSMENT:  ? ?71 yo male admitted with AKI, acute respiratory failure requiring intubation, septic shock, CAP. PMH includes CAD, PAD, DM, HTN, HLD, tobacco abuse, COPD. Extubated 5/2. ? ?5/5 - Symptomatic right carotid stenosis ? ?Pt is currently on a dysphagia 2 diet with thin liquids. Meal completion has been varied from 25-75% with 25% intake today. Pt reports having sensation of food getting stuck in throat and regurgitated his lunch. RN aware and has contacted SLP for swallow re-evaluation. Pt reports he understands if he is unable to swallow that NGT may need to be placed for tube feeding.  ? ?Labs and medications reviewed.  ? ?Diet Order:   ?Diet Order   ? ?       ?  DIET DYS 2 Room service appropriate? Yes; Fluid consistency: Thin  Diet effective now       ?  ? ?  ?  ? ?  ? ? ?EDUCATION NEEDS:  ? ?Education needs have been addressed ? ?Skin:  Skin Assessment: Reviewed RN Assessment ?Skin Integrity Issues:: Stage I ?Stage I:  R & L buttocks ? ?Last BM:  5/8 ? ?Height:  ? ?Ht Readings from Last 1 Encounters:  ?12/30/21 _0  (1.549 m)  ? ? ?Weight:  ? ?Wt Readings from Last 1 Encounters:  ?12/30/21 49 kg  ? ?BMI:  Body mass index is 20.41 kg/m?. ? ?Estimated Nutritional Needs:  ? ?Kcal:  1700-1900 ? ?Protein:  75-95 gm ? ?Fluid:  1.7 L ? ?Corrin Parker, MS, RD, LDN ?RD pager number/after hours weekend pager number on Amion. ? ?

## 2021-12-30 NOTE — Progress Notes (Incomplete)
Physical Therapy Treatment ?Patient Details ?Name: Reginald Ayers ?MRN: [id number] ?DOB: 1950/11/12 ?Today's Date: 12/30/2021 ? ? ?History of Present Illness The pt is a 71 yo male presenting 4/29 with increasing shortness of breath and required intubation and mechanical ventilation for respiratory failure. MRI shows left ICA with poor flow and evidence of reconstitued flow at L ICA terminus.  History of bilateral carotid artery stenosis (total occlusion of the left, moderate stenosis of the right), HTN, HLD, prior tobacco abuse, diabetes, coronary artery disease s/p CABG, peripheral vascular disease s/p stents.  Pt extubated on 5/2.  CTA (+) for L ICA occlusion, R ICA severe stenosis. Right carotid stent (TCAR) 5/5. ? ?  ?PT Comments  ? ? Pt presents sitting in recliner and agreeable to therapy.  Pt transfers sit to stand and SPT w/ RW to [facility].  Pt continent of bowel and bladder, nsg aware.  Pt required rest breaks.  BP decreased to 79/57 with return to recliner.  Continue to progress mobility for safe return to home, pt still insistent on returning to home. ?   ?Recommendations for follow up therapy are one component of a multi-disciplinary discharge planning process, led by the attending physician.  Recommendations may be updated based on patient status, additional functional criteria and insurance authorization. ? ?Follow Up Recommendations ? Home health PT (vs SNF) ?  ?  ?Assistance Recommended at Discharge Frequent or constant Supervision/Assistance  ?Patient can return home with the following A little help with walking and/or transfers;A little help with bathing/dressing/bathroom;Assistance with cooking/housework;Direct supervision/assist for medications management;Assist for transportation;Help with stairs or ramp for entrance ?  ?Equipment Recommendations ? BSC/3in1;Wheelchair (measurements PT);Wheelchair cushion (measurements PT)  ?  ?Recommendations for Other Services   ? ? ?  ?Precautions / Restrictions  Precautions ?Precautions: Fall;Other (comment) ?Precaution Comments: watch O2 ?Restrictions ?Weight Bearing Restrictions: No  ?  ? ?Mobility ? Bed Mobility ?  ?  ?  ?  ?  ?  ?  ?  ?  ? ?Transfers ?Overall transfer level: Needs assistance ?Equipment used: Rolling walker (2 wheels) ?Transfers: Sit to/from Stand ?Sit to Stand: Min guard ?  ?  ?  ?  ?  ?General transfer comment: Cues for hand placement, min guard for safety. Increased time/effort, pt transferred to [facility] continent of bowel and bladder. ?  ? ?Ambulation/Gait ?  ?  ?  ?  ?  ?  ?  ?  ? ? ?Stairs ?  ?  ?  ?  ?  ? ? ?Wheelchair Mobility ?  ? ?Modified Rankin (Stroke Patients Only) ?  ? ? ?  ?Balance Overall balance assessment: Needs assistance ?Sitting-balance support: Feet supported ?Sitting balance-Leahy Scale: Fair ?  ?  ?  ?  ?Standing balance comment: reliant on external support ?  ?  ?  ?  ?  ?  ?  ?  ?  ?  ?  ?  ? ?  ?Cognition Arousal/Alertness: Awake/alert ?Behavior During Therapy: [facility] for tasks assessed/performed ?  ?Area of Impairment: Safety/judgement ?  ?  ?  ?  ?  ?  ?  ?  ?  ?  ?  ?  ?Safety/Judgement: Decreased awareness of deficits ?  ?  ?General Comments: Pt definite about wanting to return to home before B-day ?  ?  ? ?  ?Exercises   ? ?  ?General Comments   ?  ?  ? ?Pertinent Vitals/Pain Pain Assessment ?Pain Assessment: No/denies pain ?Pain Score: 0-No pain (Pt  states pressure/"feels like something won't go down" w/ activity to [facility].  decreased w/ rest.) ?Pain Intervention(s): Limited activity within patient's tolerance  ? ? ?Home Living   ?  ?  ?  ?  ?  ?  ?  ?  ?  ?   ?  ?Prior Function    ?  ?  ?   ? ?PT Goals (current goals can now be found in the care plan section) Acute Rehab PT Goals ?Patient Stated Goal: go home ?PT Goal Formulation: With patient ?Time For Goal Achievement: 01/06/22 ?Potential to Achieve Goals: Fair ? ?  ?Frequency ? ? ? Min 3X/week ? ? ? ?  ?PT Plan    ? ? ?Co-evaluation   ?  ?  ?  ?  ? ?  ?AM-PAC PT "6 Clicks"  Mobility   ?Outcome Measure ? Help needed turning from your back to your side while in a flat bed without using bedrails?: A Little ?Help needed moving from lying on your back to sitting on the side of a flat bed without using bedrails?: A Little ?Help needed moving to and from a bed to a chair (including a wheelchair)?: A Little ?Help needed standing up from a chair using your arms (e.g., wheelchair or bedside chair)?: A Little ?Help needed to walk in hospital room?: Total ?Help needed climbing 3-5 steps with a railing? : Total ?6 Click Score: 14 ? ?  ?End of Session Equipment Utilized During Treatment: Gait belt;Oxygen ?Activity Tolerance: Treatment limited secondary to medical complications (Comment) (BP decreased to 79/57 (MAP 65) during mobility to [facility].  Nsg aware) ?Patient left: in chair;with chair alarm set;with call bell/phone within reach ?Nurse Communication: Mobility status;Other (comment) (continent of B and B in BSC to NT, nursing aware of c/o pressure to chest and BP.) ?PT Visit Diagnosis: Unsteadiness on feet (R26.81);Muscle weakness (generalized) (M62.81) ?  ? ? ?Time: [id number] ?PT Time Calculation (min) (ACUTE ONLY): 36 min ? ?Charges:  $Therapeutic Activity: 23-37 mins          ?          ? ?{enter signature dotphrase here} ? ? ?Ladoris Gene ?12/30/2021, 12:25 PM ? ?

## 2021-12-30 NOTE — Care Management Important Message (Signed)
Important Message ? ?Patient Details  ?Name: Reginald Ayers ?MRN: [id number] ?Date of Birth: [birth year] ? ? ?Medicare Important Message Given:  Yes ? ? ? ? ?[name] ?12/30/2021, 8:02 AM ?

## 2021-12-30 NOTE — Progress Notes (Signed)
?      ?                 PROGRESS NOTE ? ?      ?PATIENT DETAILS ?Name: Reginald Ayers ?Age: 71 y.o. ?Sex: male ?Date of Birth: January 23, 1951 ?Admit Date: 12/20/2021 ?Admitting Physician Kipp Brood, MD ?[id number], Jori Moll, MD ? ?Brief Summary: ?Patient is a 71 y.o.  male with history of PAD, CAD s/p CABG, HTN, HLD, COPD-who presented with weakness/shortness of breath-he was found to have acute hypoxic respiratory failure due to multifocal pneumonia-was intubated in the emergency room and subsequently admitted to the ICU by PCCM. Upon further work-up in ICU-patient was found to Sub acute CVA &  80% stenosis of right ICA-underwent s/p right carotid stenting on 5/5-following which patient developed transient left-sided weakness likely due to hypoperfusion & New acute CVA.  He also had A-fib RVR during perioperative period,  See below for further details.  Transferred to my service on 12/28/2021 ? ?Significant events: ?4/29>>Intubated and admitted to the ICU. >> A fib and elevated troponins ?4/30>> MRI with acute CVA. ?5/02>> extubated  ?5/04>> transfer to [facility]. ?5/05>> TCAR-postoperatively-left hemiparesis-code stroke-transfer to ICU.  Repeat MRI brain-without acute CVA.   ?5/06>> postoperative left-sided weakness resolved-was likely due to hypoperfusion ? ?Significant studies: ?4/29>> CT head: No acute intracranial abnormality. ?4/29>> CXR: Right lower lobe airspace consolidation. ?4/29>> CT chest/abdomen/pelvis: Multifocal PNA ?4/30>> A1c: 8.1 ?4/30>> LDL: 21 LDL of 20 ?4/30>> MRI brain: Acute ischemia left temporal lobe, left ICA occlusion. ?5/01>> Echo: EF 50-55%, dyskinesis of left ventricular apical segment. ?5/04>> CTA neck:> 80% stenosis of right ICA, left ICA occluded. ?5/04>> CTA head: Left ICA occluded at the precavernous/cavernous segments-reconstitution within the paraclinoid and supraclinoid ICA.  No other large LVO. ?5/05>> repeat MRI brain: No acute CVA ?5/05>> repeat CTA head/neck: Stenting of right  ICA-unchanged occlusion of left ICA-no other hemodynamically significant stenosis. ? ? ?Significant microbiology data: ?4/29>> COVID/influenza PCR: Negative ?4/29>> blood culture: No growth ?4/30>> trach aspirate: Normal respiratory flora ? ?Procedures: ?4/29>> 5/2:ETT: ?5/05>> TCAR/right carotid stent ? ?Consults: ?PCCM, neurology, vascular surgery, cardiology ? ?Subjective: Patient in bed, appears comfortable, denies any headache, no fever, no chest pain or pressure, no shortness of breath , no abdominal pain. No new focal weakness, wants to go home and not to SNF. ? ? ?Objective: ?Vitals: ?Blood pressure 101/76, pulse 80, temperature 98.1 ?F (36.7 ?C), temperature source Oral, resp. rate (!) 22, height '5\' 1"'  (1.549 m), weight 49 kg, SpO2 92 %.  ? ?Exam: ? ?Awake Alert, No new F.N deficits, Normal affect ?[place].AT,PERRAL ?Supple Neck, No JVD,   ?Symmetrical Chest wall movement, Good air movement bilaterally, mild rales ?RRR,No Gallops, Rubs or new Murmurs,  ?+ve B.Sounds, Abd Soft, No tenderness,   ?No Cyanosis, Clubbing or edema  ? ? ? ?Assessment/Plan: ? ?Severe sepsis due to PNA - Acute hypoxic respiratory failure - Sepsis physiology has resolved-hypoxia stable on 2-3 L of oxygen.  All cultures negative to date-completed course of antibiotics.  SLP on board, stable on D2 diet, encouraged to use I-S and flutter valve for pulmonary toiletry, DC likely 12/31/21 - TOC informed. ? ?Sub Acute L. Temporal lobe CVA upon presentation, R. Carotid TCAR with peri op R. MCA hypoperfusion / R. MCA small Infarct and L sides weakness. Unclear whether this is a embolic etiology from A-fib or from hypoperfusion in the setting of left ICA occlusion.  Work-up as above-on ASA/Plavix/statin, along with Eliquis as above.  Per vascular surgery after  30 days stop Plavix and continue aspirin Eliquis and statin.  Discussed with stroke team on 12/28/2021 as well. ? ?Left ICA occlusion, More than 80% right ICA stenosis s/p TCAR on 5/5-vascular  surgery following, currently on combination of statin, aspirin, Plavix and Eliquis.  After 30 days discontinue Plavix. ? ?PAF with RVR with [place] vas 2 score of greater than 5 - Maintaining sinus rhythm-A-fib likely triggered by PNA/hypoxemia.  Echo stable.  Recent TSH February stable.  Now on Eliquis, increased beta-blocker dose on 12/29/2021 for better rate control.  2 doses of digoxin as well on 12/29/21. Rate much better now. ? ?Acute metabolic encephalopathy: 2/2 sepsis/hypoxia-improved-relatively awake and alert ? ?AKI on CKD stage 3a: Likely hemodynamically mediated-baseline creatinine recently seems to be around 1.5.  Monitor closely. ? ?Elevated troponins: Likely demand ischemia-echo with stable EF.  Cardiology consulted. ? ?History of CAD s/p CABG 2010 - On antiplatelet/beta-blocker-previously not on statin due to myalgias-but seems to be tolerating Crestor well. ? ?History of PAD s/p left common iliac/right SFA stenting - Continue antiplatelets/statin. ? ?Normocytic anemia: Due to acute illness-hemoglobin down to 7.0 today-no obvious GI bleeding apparent.  Given drop in hemoglobin/CAD/CVA-need for dual antiplatelet agent/anticoagulation-we will transfuse 1 unit of PRBC. ? ?Hypokalemia - replaced. ? ?COPD Continue bronchodilators-not in exacerbation ? ?DM-2: CBGs running high added Lantus to sliding scale, adjust dose further on 12/29/2021. ? ?Recent Labs  ?  12/29/21 ?1615 12/29/21 ?2106 12/30/21 ?0616  ?GLUCAP 236* 211* 189*  ?  ? ? ?Nutrition Status: ?Nutrition Problem: Severe Malnutrition ?Etiology: chronic illness (COPD, CAD) ?Signs/Symptoms: severe muscle depletion, severe fat depletion, percent weight loss (13% weight loss within 3 months) ?Percent weight loss: 13 % ?Interventions: Hormel Shake, MVI ? ?Pressure Ulcer: ?Pressure Injury 12/20/21 Buttocks Right;Left Stage 1 -  Intact skin with non-blanchable redness of a localized area usually over a bony prominence. wound is 0.5 cm lenght  by 0.6 cm width  (Active)  ?12/20/21 1831  ?Location: Buttocks  ?Location Orientation: Right;Left  ?Staging: Stage 1 -  Intact skin with non-blanchable redness of a localized area usually over a bony prominence.  ?Wound Description (Comments): wound is 0.5 cm lenght  by 0.6 cm width  ?Present on Admission: Yes  ?Dressing Type Foam - Lift dressing to assess site every shift 12/30/21 0000  ? ? ?BMI: ?Estimated body mass index is 20.41 kg/m? as calculated from the following: ?  Height as of this encounter: '5\' 1"'  (1.549 m). ?  Weight as of this encounter: 49 kg.  ? ?Code status: ?  Code Status: Full Code  ? ?DVT Prophylaxis: ?apixaban (ELIQUIS) tablet 2.5 mg Start: 12/28/21 2200 IV heparin ? ?Family Communication: None at bedside ? ? ?Disposition Plan: ?Status is: Inpatient ?Remains inpatient appropriate because: Admitted with sepsis from PNA-developed CVA-s/p TCAR of right carotid artery stenosis-developed transient strokelike symptoms from hypoperfusion-needs PRBC transfusion today.  Not stable for discharge-SNF sometime next week. ?  ?Planned Discharge Destination:Skilled nursing facility VS HHPT ? ? ?Diet: ?Diet Order   ? ?       ?  DIET DYS 2 Room service appropriate? Yes; Fluid consistency: Thin  Diet effective now       ?  ? ?  ?  ? ?  ?  ? ?MEDICATIONS: ?Scheduled Meds: ? apixaban  2.5 mg Oral BID  ? arformoterol  15 mcg Nebulization BID  ? aspirin EC  81 mg Oral Daily  ? budesonide (PULMICORT) nebulizer solution  0.25 mg Nebulization BID  ? chlorhexidine  gluconate (MEDLINE KIT)  15 mL Mouth Rinse BID  ? Chlorhexidine Gluconate Cloth  6 each Topical Daily  ? clopidogrel  75 mg Oral Daily  ? docusate sodium  100 mg Oral BID  ? empagliflozin  25 mg Oral Daily  ? ferrous sulfate  325 mg Oral Q breakfast  ? insulin aspart  0-20 Units Subcutaneous TID WC  ? insulin aspart  0-5 Units Subcutaneous QHS  ? insulin aspart  3 Units Subcutaneous TID WC  ? insulin glargine-yfgn  15 Units Subcutaneous Daily  ? mouth rinse  15 mL Mouth Rinse  QID  ? metoprolol tartrate  50 mg Oral BID  ? multivitamin with minerals  1 tablet Oral Daily  ? polyethylene glycol  17 g Oral Daily  ? potassium chloride  20 mEq Oral Once  ? revefenacin  175 mcg Nebulization

## 2021-12-30 NOTE — TOC Progression Note (Addendum)
Transition of Care (TOC) - Progression Note  ? ? ?Patient Details  ?Name: HORICE CARRERO ?MRN: [id number] ?Date of Birth: December 15, 1950 ? ?Transition of Care (TOC) CM/SW Contact  ?Leone Haven, RN ?Phone Number: ?12/30/2021, 1:16 PM ? ?Clinical Narrative:    ?Patient is active with [facility], left vm with Amy to see what services , patient has with them. ? ? ?Expected Discharge Plan: Home w Home Health Services ?Barriers to Discharge: Continued Medical Work up, SNF Pending bed offer ? ?Expected Discharge Plan and Services ?Expected Discharge Plan: Home w Home Health Services ?In-house Referral: Clinical Social Work ?  ?Post Acute Care Choice: Skilled Nursing Facility ?Living arrangements for the past 2 months: Single Family Home ?                ?  ?  ?  ?  ?  ?  ?  ?  ?  ?  ? ? ?Social Determinants of Health (SDOH) Interventions ?  ? ?Readmission Risk Interventions ?   ? View : No data to display.  ?  ?  ?  ? ? ?

## 2021-12-30 NOTE — Progress Notes (Signed)
Pt transferred without difficulty to 3E07 by this RN and Nori Riis RN, met by RN and NT, all questions answered ?

## 2021-12-30 NOTE — Care Management Important Message (Signed)
Important Message ? ?Patient Details  ?Name: Reginald Ayers ?MRN: [id number] ?Date of Birth: Jan 10, 1951 ? ? ?Medicare Important Message Given:  Yes ? ? ? ? ?Shelda Altes ?12/30/2021, 7:55 AM ?

## 2021-12-30 NOTE — Progress Notes (Signed)
Mobility Specialist Progress Note ? ? 12/30/21 1805  ?Mobility  ?Activity Transferred from chair to bed  ?Level of Assistance Contact guard assist, steadying assist  ?Assistive Device Front wheel walker  ?Distance Ambulated (ft) 4 ft  ?Activity Response Tolerated well  ?$Mobility charge 1 Mobility  ? ?Received in chair requesting to get back to bed. Required min vocal cues for sequencing but no fault during transfer. Left in bed with call bell in reach and all needs met.  ? ?Holland Falling ?Mobility Specialist ?Phone Number [phone] ? ?

## 2021-12-30 NOTE — Progress Notes (Addendum)
Inpatient Diabetes Program Recommendations ? ?AACE/ADA: New Consensus Statement on Inpatient Glycemic Control (2015) ? ?Target Ranges:  Prepandial:   less than 140 mg/dL ?     Peak postprandial:   less than 180 mg/dL (1-2 hours) ?     Critically ill patients:  140 - 180 mg/dL  ? ?Lab Results  ?Component Value Date  ? GLUCAP 181 (H) 12/30/2021  ? HGBA1C 8.1 (H) 12/21/2021  ? ? ?Review of Glycemic Control ? Latest Reference Range & Units 12/29/21 16:15 12/29/21 21:06 12/30/21 06:16 12/30/21 11:30  ?Glucose-Capillary 70 - 99 mg/dL 236 (H) 211 (H) 189 (H) 181 (H)  ?(H): Data is abnormally high ?Diabetes history: Type 2 DM ?Outpatient Diabetes medications: Metformin 1000 mg BID, Jardiance 25 mg QD ?Current orders for Inpatient glycemic control: Jardiance 25 mg QD, Semglee 15 units QD, Novolog 3 units TID, Novolog 0-20 units TID, Novolog 0-5 units QHS ? ?Inpatient Diabetes Program Recommendations:   ? ?Spoke with patient regarding outpatient diabetes management. Hesitant to take insulin because was recently hospitalized due to hypoglycemia following discontinuing of Glipizide.  ?Reviewed patient's current A1c of 8.1%. Explained what a A1c is and what it measures. Also reviewed goal A1c with patient, importance of good glucose control @ home, and blood sugar goals. Reviewed patho of DM, need for continued treatment and management, impact of glucose trends on vasculature, survival skills reviewed, interventions, impact of infection on glucose trends and commorbidities.  ?Patient has a meter and testing supplies.  ?Educated patient on insulin pen use at home. Reviewed contents of insulin flexpen starter kit. Reviewed all steps if insulin pen including attachment of needle, 2-unit air shot, dialing up dose, giving injection, removing needle, disposal of sharps, storage of unused insulin, disposal of insulin etc. Patient verbalized he would be able to provide successful return demonstration, however, distracted by lunch that  was brought in during demonstration. Also reviewed troubleshooting with insulin pen. MD to give patient Rxs for insulin pens and insulin pen needles. ?Feel this patient would require close monitoring if to be discharged home on insulin.  ?RN to begin working with patient on self injections. ? ?Addendum: ?Spoke with patient's niece [name], regarding plan for insulin injections. She lives with him and plans to oversee insulin administration and diabetes care. Has personal experience with insulin pen injections and feels comfortable with interventions and management.  ?Further reinforced survival skills, interventions, impact of infection with insulin needs, current insulin needs, importance of eliminating sugary beverages, importance of CHO mindfulness, frequency of checking CBGs, when to call MD and other commorbidities. Patient previously was used to skipping meals, hence previous issue with lows. Discussed sick day rules and what to do when meals are missed. She is requesting freestyle libre. Reviewed risks and benefits as well as coverage options given Medicare. All questions answered.  ?Secure chat sent to Dr Candiss Norse. ? ?Follow. ? ?Thanks, ?Bronson Curb, MSN, RNC-OB ?Diabetes Coordinator ?[phone] (8a-5p) ? ? ? ? ? ?

## 2021-12-31 ENCOUNTER — Other Ambulatory Visit (HOSPITAL_COMMUNITY): Payer: Self-pay

## 2021-12-31 DIAGNOSIS — E872 Acidosis, unspecified: Secondary | ICD-10-CM | POA: Diagnosis not present

## 2021-12-31 LAB — GLUCOSE, CAPILLARY
Glucose-Capillary: 233 mg/dL — ABNORMAL HIGH (ref 70–99)
Glucose-Capillary: 342 mg/dL — ABNORMAL HIGH (ref 70–99)

## 2021-12-31 LAB — BASIC METABOLIC PANEL
Anion gap: 7 (ref 5–15)
BUN: 30 mg/dL — ABNORMAL HIGH (ref 8–23)
CO2: 24 mmol/L (ref 22–32)
Calcium: 8 mg/dL — ABNORMAL LOW (ref 8.9–10.3)
Chloride: 105 mmol/L (ref 98–111)
Creatinine, Ser: 1.51 mg/dL — ABNORMAL HIGH (ref 0.61–1.24)
GFR, Estimated: 49 mL/min — ABNORMAL LOW (ref >60)
Glucose, Bld: 158 mg/dL — ABNORMAL HIGH (ref 70–99)
Potassium: 4.2 mmol/L (ref 3.5–5.1)
Sodium: 136 mmol/L (ref 135–145)

## 2021-12-31 LAB — MAGNESIUM: Magnesium: 2.2 mg/dL (ref 1.7–2.4)

## 2021-12-31 MED ORDER — ASPIRIN 81 MG PO TBEC
81.0000 mg | DELAYED_RELEASE_TABLET | Freq: Every day | ORAL | 0 refills | Status: DC
  Filled 2021-12-31: qty 30, 30d supply, fill #0

## 2021-12-31 MED ORDER — ACCU-CHEK SOFTCLIX LANCETS MISC
1 refills | Status: DC
  Filled 2021-12-31: qty 100, 25d supply, fill #0

## 2021-12-31 MED ORDER — METOPROLOL TARTRATE 25 MG PO TABS
25.0000 mg | ORAL_TABLET | Freq: Two times a day (BID) | ORAL | Status: DC
  Administered 2021-12-31: 25 mg via ORAL
  Filled 2021-12-31: qty 1

## 2021-12-31 MED ORDER — INSULIN PEN NEEDLE 32G X 4 MM MISC
0 refills | Status: DC
  Filled 2021-12-31: qty 100, 25d supply, fill #0

## 2021-12-31 MED ORDER — BASAGLAR KWIKPEN 100 UNIT/ML ~~LOC~~ SOPN
15.0000 [IU] | PEN_INJECTOR | Freq: Every day | SUBCUTANEOUS | 0 refills | Status: DC
  Filled 2021-12-31: qty 3, 20d supply, fill #0

## 2021-12-31 MED ORDER — ROSUVASTATIN CALCIUM 20 MG PO TABS
20.0000 mg | ORAL_TABLET | Freq: Every day | ORAL | 0 refills | Status: DC
  Filled 2021-12-31: qty 30, 30d supply, fill #0

## 2021-12-31 MED ORDER — INSULIN ASPART 100 UNIT/ML FLEXPEN
PEN_INJECTOR | SUBCUTANEOUS | 0 refills | Status: DC
  Filled 2021-12-31: qty 3, 28d supply, fill #0

## 2021-12-31 MED ORDER — DIGOXIN 0.25 MG/ML IJ SOLN
0.1250 mg | Freq: Once | INTRAMUSCULAR | Status: AC
Start: 2021-12-31 — End: 2021-12-31
  Administered 2021-12-31: 0.125 mg via INTRAVENOUS
  Filled 2021-12-31: qty 2

## 2021-12-31 MED ORDER — BLOOD GLUCOSE MONITOR SYSTEM W/DEVICE KIT
PACK | 0 refills | Status: AC
  Filled 2021-12-31: qty 1, 30d supply, fill #0

## 2021-12-31 MED ORDER — APIXABAN 2.5 MG PO TABS
2.5000 mg | ORAL_TABLET | Freq: Two times a day (BID) | ORAL | 0 refills | Status: DC
  Filled 2021-12-31: qty 60, 30d supply, fill #0

## 2021-12-31 MED ORDER — ACCU-CHEK GUIDE VI STRP
ORAL_STRIP | 1 refills | Status: DC
  Filled 2021-12-31: qty 100, 25d supply, fill #0

## 2021-12-31 MED ORDER — PANTOPRAZOLE SODIUM 40 MG PO TBEC
40.0000 mg | DELAYED_RELEASE_TABLET | Freq: Every day | ORAL | 2 refills | Status: DC
  Filled 2021-12-31: qty 30, 30d supply, fill #0

## 2021-12-31 NOTE — Progress Notes (Signed)
Pharmacy Discharge Review ? ?Patient with new prescriptions for apixaban, aspirin, insulin aspart, insulin glargine, insulin pen needles, pantoprazole and rosuvastatin.  ? ?Per [facility] pharmacy, patient copay is [phone] after running through patient's insurance. Discussed this with patient, who stated that he cannot afford this cost. Patient also expressed that his diabetes has always been controlled by oral medications. ? ?Inpatient diabetes team educated patient and niece on 5/9. I reinforced the reasons for holding metformin and needing to add insulin at this time. Patient was then agreeable to give himself insulin once daily instead of 4 times daily. Informed patient that copay will be $45.64 after switching to Basaglar Pen, which has a lower copay, and without filling insulin aspart. Patient was agreeable to this.  ? ?Appreciate the collaborative efforts of [facility] pharmacy team, Inpatient Diabetes Team, Case manager, and Physician. ? ? ?Alphia Moh, PharmD, BCPS, BCCP ?Clinical Pharmacist ? ?Please check AMION for all [facility] Pharm[facility] phone numbers ?After 10:00 PM, call Main Pharmacy [phone] ? ?

## 2021-12-31 NOTE — Progress Notes (Signed)
Speech Language Pathology Treatment: Dysphagia  ?Patient Details ?Name: Reginald Ayers ?MRN: [id number] ?DOB: September 11, 1950 ?Today's Date: 12/31/2021 ?Time: 4627-0350 ?SLP Time Calculation (min) (ACUTE ONLY): 20 min ? ?Assessment / Plan / Recommendation ?Clinical Impression ? Visited pt with niece also at bedside. Pt has still be regurgitating at meals times per report from lunch yesterday. SLP went ahead and changed diet to puree. Explained the problem of food sitting in pts esophagus and him coughing it up. Given no dentition, chunkier harder foods like meat and undercooked veggies will be hard for him. We discussed supplementing with liquid nutrition and preparing some things in the blender like soups and and smoothies. RN gave pt pills in puree and cued him to follow with sips successfully. SLP and Niece reiterated this strategy with him. Pt likely to d/c home soon. Home health SLP can potentially assist pt and niece with strategies and further education in the home setting.   ?HPI HPI: 71 yo male presented 4/29 with sepsis secondary to CAP, requiring intubation and mechanical ventilation for respiratory failure. ETT 4/29-5/2. MRI: punctate left temporal infarct; chronic cortical infarcts in the left middle and superior frontal gyri.  History of bilateral carotid artery stenosis (total occlusion of the left, moderate stenosis of the right), HTN, HLD, prior tobacco abuse, diabetes, coronary artery disease s/p CABG, peripheral vascular disease s/p stents. Follow up for dysphagia management indicated. Right carotid stent placed 12/26/2021. Post-op with left hemiparesis; MRI no acute changes; resolved weakness. ?  ?   ?SLP Plan ? Continue with current plan of care ? ?  ?  ?Recommendations for follow up therapy are one component of a multi-disciplinary discharge planning process, led by the attending physician.  Recommendations may be updated based on patient status, additional functional criteria and insurance  authorization. ?  ? ?Recommendations  ?Diet recommendations: Thin liquid;Dysphagia 1 (puree) ?Liquids provided via: Cup;Straw ?Medication Administration: Whole meds with puree ?Supervision: Patient able to self feed;Full supervision/cueing for compensatory strategies ?Compensations: Slow rate;Small sips/bites;Follow solids with liquid ?Postural Changes and/or Swallow Maneuvers: Seated upright 90 degrees;Upright 30-60 min after meal  ?   ?    ?   ? ? ? ? Oral Care Recommendations: Oral care BID ?Follow Up Recommendations: Home health SLP ?Assistance recommended at discharge: Set up Supervision/Assistance ?SLP Visit Diagnosis: Dysphagia, unspecified (R13.10) ?Plan: Continue with current plan of care ? ? ? ? ?  ?  ? ? ?Kaleea Penner, Riley Nearing ? ?12/31/2021, 9:47 AM ?

## 2021-12-31 NOTE — Progress Notes (Addendum)
SATURATION QUALIFICATIONS: (This note is used to comply with regulatory documentation for home oxygen) ? ?Patient Saturations on Room Air at Rest = 86% ? ?Patient Saturations on Room Air while Ambulating = 85% ? ?Patient Saturations on  Liters of oxygen while Ambulating = 94% ? ?Please briefly explain why patient needs home oxygen: desats at rest and walking ?

## 2021-12-31 NOTE — Progress Notes (Signed)
Mobility Specialist Progress Note: ? ? 12/31/21 1220  ?Mobility  ?Activity Ambulated with assistance to bathroom  ?Level of Assistance Standby assist, set-up cues, supervision of patient - no hands on  ?Assistive Device Front wheel walker  ?Distance Ambulated (ft) 30 ft  ?Activity Response Tolerated well  ?$Mobility charge 1 Mobility  ? ?Pt ready for d/c, requesting to use BR. minA required to stand from low toilet, no other physical assist needed. Pt in w/c, ready for d/c home.  ? ?Reginald Ayers ?Acute Rehab ?Phone: [phone] ?Office Phone: [phone] ? ?

## 2021-12-31 NOTE — Progress Notes (Signed)
Inpatient Diabetes Program Recommendations ? ?AACE/ADA: New Consensus Statement on Inpatient Glycemic Control (2015) ? ?Target Ranges:  Prepandial:   less than 140 mg/dL ?     Peak postprandial:   less than 180 mg/dL (1-2 hours) ?     Critically ill patients:  140 - 180 mg/dL  ? ?Lab Results  ?Component Value Date  ? GLUCAP 233 (H) 12/31/2021  ? HGBA1C 8.1 (H) 12/21/2021  ? ? ?Review of Glycemic Control ? Latest Reference Range & Units 12/30/21 16:29 12/30/21 21:07 12/31/21 06:30  ?Glucose-Capillary 70 - 99 mg/dL 414 (H) 239 (H) 532 (H)  ?(H): Data is abnormally high ?Diabetes history: Type 2 DM ?Outpatient Diabetes medications: Metformin 1000 mg BID, Jardiance 25 mg QD ?Current orders for Inpatient glycemic control: Jardiance 25 mg QD, Semglee 15 units QD, Novolog 3 units TID, Novolog 0-20 units TID, Novolog 0-5 units QHS ?  ?Inpatient Diabetes Program Recommendations:   ? ?Discussed discharge plan with pharmacy due to issues with patient affordability. Patient while inpatient is not consuming most meals at >50% and niece reports that patient often skips meals and that intake in inconsistent, thus would not feel comfortable with meal coverage.  ?In preparation for discharge, patient able to afford Basaglar <$20.  ?Could also consider Prandin? Gliptin? ? ?Per conversation with patient on 5/9- "Hesitant to take insulin because was recently hospitalized due to hypoglycemia following discontinuing of Glipizide. " ? ?Thanks, ?Lujean Rave, MSN, RNC-OB ?Diabetes Coordinator ?[phone] (8a-5p) ? ? ? ?

## 2021-12-31 NOTE — TOC Transition Note (Addendum)
Transition of Care (TOC) - CM/SW Discharge Note ? ? ?Patient Details  ?Name: Reginald Ayers ?MRN: [id number] ?Date of Birth: 19-Jun-1951 ? ?Transition of Care (TOC) CM/SW Contact:  ?Leone Haven, RN ?Phone Number: ?12/31/2021, 10:13 AM ? ? ?Clinical Narrative:    ?Patient is for dc today, his niece is in the room who will transport him home, he is needing home oxygen and BSC, [employer] is bringing DME to room prior to dc. NCM notified Amy with [facility] of dc. Patient having problems affording insulin, TOC is working with diabetic coordinator to see if they can get the co pay down for patient. TOC has worked out the insulin with diabetees. ? ? ?Final next level of care: Home w Home Health Services ?Barriers to Discharge: No Barriers Identified ? ? ?Patient Goals and CMS Choice ?Patient states their goals for this hospitalization and ongoing recovery are:: home with neice with HH ?CMS Medicare.gov Compare Post Acute Care list provided to:: Patient Represenative (must comment) ?Choice offered to / list presented to : Adult Children ? ?Discharge Placement ?  ?           ?  ?  ?  ?  ? ?Discharge Plan and Services ?In-house Referral: Clinical Social Work ?  ?Post Acute Care Choice: Skilled Nursing Facility          ?DME Arranged: Oxygen, Bedside commode ?DME Agency: [employer] ?Date DME Agency Contacted: 12/31/21 ?Time DME Agency Contacted: 1012 ?Representative spoke with at DME Agency: Ian Malkin ?HH Arranged: PT, OT, Nurse's Aide, Social Work, Speech Therapy ?HH Agency: [facility] ?Date HH Agency Contacted: 12/31/21 ?Time HH Agency Contacted: 1013 ?Representative spoke with at [facility] Agency: Amy ? ?Social Determinants of Health (SDOH) Interventions ?  ? ? ?Readmission Risk Interventions ?   ? View : No data to display.  ?  ?  ?  ? ? ? ? ? ?

## 2021-12-31 NOTE — Discharge Summary (Addendum)
?                                                                                ? Reginald Ayers [id number] DOB: 01-30-1951 DOA: 12/20/2021 ? ?PCP: Seward Carol, MD ? ?Admit date: 12/20/2021  Discharge date: 12/31/2021 ? ?Admitted From: Home   Disposition:  Home ? ? ?Recommendations for Outpatient Follow-up:  ? ?Follow up with PCP in 1-2 weeks ? ?PCP Please obtain BMP/CBC, 2 view CXR in 1week,  (see Discharge instructions)  ? ?PCP Please follow up on the following pending results: Kindly arrange for outpatient EGD for ongoing dysphagia and to rule out esophageal stricture.  Stop Plavix after 30 days continue aspirin and Eliquis along with statin indefinitely. ? ? ?Home Health: PT, RN, SLP   ?Equipment/Devices: as below  ?Consultations: PCCM, neurology, vascular surgery, cardiology ?Discharge Condition: Stable ?CODE STATUS: Full    ?Diet Recommendation: Dysphagia 1 soft diet with full feeding assistance and aspiration precautions. ? ?   ? ?Chief Complaint  ?Patient presents with  ? Stroke Symptoms  ?  ? ?Brief history of present illness from the day of admission and additional interim summary   ? ?71 y.o.  male with history of PAD, CAD s/p CABG, HTN, HLD, COPD-who presented with weakness/shortness of breath-he was found to have acute hypoxic respiratory failure due to multifocal pneumonia-was intubated in the emergency room and subsequently admitted to the ICU by PCCM. Upon further work-up in ICU-patient was found to Sub acute CVA &  80% stenosis of right ICA-underwent s/p right carotid stenting on 5/5-following which patient developed transient left-sided weakness likely due to hypoperfusion & New acute CVA.  He also had A-fib RVR during perioperative period,  See below for further details.  Transferred to my service on 12/28/2021 ?  ?Significant events: ?4/29>>Intubated and admitted to the ICU. >> A fib and elevated  troponins ?4/30>> MRI with acute CVA. ?5/02>> extubated  ?5/04>> transfer to [facility]. ?5/05>> TCAR-postoperatively-left hemiparesis-code stroke-transfer to ICU.  Repeat MRI brain-without acute CVA.   ?5/06>> postoperative left-sided weakness resolved-was likely due to hypoperfusion ?  ?Significant studies: ?4/29>> CT head: No acute intracranial abnormality. ?4/29>> CXR: Right lower lobe airspace consolidation. ?4/29>> CT chest/abdomen/pelvis: Multifocal PNA ?4/30>> A1c: 8.1 ?4/30>> LDL: 21 LDL of 20 ?4/30>> MRI brain: Acute ischemia left temporal lobe, left ICA occlusion. ?5/01>> Echo: EF 50-55%, dyskinesis of left ventricular apical segment. ?5/04>> CTA neck:> 80% stenosis of right ICA, left ICA occluded. ?5/04>> CTA head: Left ICA occluded at the precavernous/cavernous segments-reconstitution within the paraclinoid and supraclinoid ICA.  No other large LVO. ?5/05>> repeat MRI brain: No acute CVA ?5/05>> repeat CTA head/neck: Stenting of right ICA-unchanged occlusion of left ICA-no other hemodynamically significant stenosis. ?  ?  ?Significant microbiology data: ?4/29>> COVID/influenza PCR: Negative ?4/29>> blood culture: No growth ?4/30>> trach aspirate: Normal respiratory flora ?  ?Procedures: ?4/29>> 5/2:ETT: ?5/05>> TCAR/right carotid stent ? ?  Hospital Course  ? ?Severe sepsis due to PNA - Acute hypoxic respiratory failure - Sepsis physiology has resolved-hypoxia stable on 2-3 L of oxygen.  All cultures negative to date-completed course of antibiotics.  SLP on board, stable on D2 diet, encouraged to use I-S and flutter valve for pulmonary toiletry, DC likely 12/31/21 -patient and niece Elvin So chose to go home with home health PT and speech therapy which were arranged along with 2 L nasal cannula as needed home oxygen.  They refused SNF, he is very high risk for readmission, noncompliant with our recommendations. ?  ?Sub Acute L. Temporal lobe CVA upon  presentation, R. Carotid TCAR with peri op R. MCA hypoperfusion / R. MCA small Infarct and L sides weakness. Unclear whether this is a embolic etiology from A-fib or from hypoperfusion in the setting of left ICA occlusion.  Work-up as above-on ASA/Plavix/statin, along with Eliquis as above.  Per vascular surgery after 30 days stop Plavix and continue aspirin Eliquis and statin.  Discussed with stroke team on 12/28/2021 as well. ?  ?Left ICA occlusion, More than 80% right ICA stenosis s/p TCAR on 5/5-vascular surgery following, currently on combination of statin, aspirin, Plavix and Eliquis.  After 30 days discontinue Plavix. ?  ?PAF with RVR with [place] vas 2 score of greater than 5 - Maintaining sinus rhythm-A-fib likely triggered by PNA/hypoxemia.  Echo stable.  Recent TSH February stable.  Continue on beta-blocker also added Eliquis.  Rate in decent control on beta-blocker at home dose.  Also received 2 doses of IV digoxin yesterday and 1 dose today. ?  ?Acute metabolic encephalopathy: 2/2 sepsis/hypoxia-improved-relatively awake and alert, this problem has resolved. ?  ?AKI on CKD stage 3a: Likely hemodynamically mediated-baseline creatinine recently seems to be around 1.5.  Monitor closely. Renal Function at baseline. ?  ?Elevated troponins: Likely demand ischemia-echo with stable EF.  Cardiology consulted. ?  ?History of CAD s/p CABG 2010 - On antiplatelet/beta-blocker-previously not on statin due to myalgias-but seems to be tolerating Crestor well. ?  ?History of PAD s/p left common iliac/right SFA stenting - Continue antiplatelets/statin. ?  ?Normocytic anemia: AOCD initially r required 1 unit of PRBC.  H&H now stable.  Also placed on PPI as he is currently on aspirin-Plavix and Eliquis. ?  ?COPD Continue bronchodilators-not in exacerbation ?  ?DM-2: Continue home regimen added Lantus and sliding scale as well, diabetic and insulin education provided.  Testing supplies provided.  PCP to monitor ?  ?Fortunately  patient and niece both do not want patient to take short acting insulin, patient has insurance but does not want to pay co-pay, says he has income but does not want to spread it for now.  Niece not willing to help either.  Case management and pharmacist have talked to the patient.  He is refusing to fill or take short acting insulin at any cost. ? ?Discharge diagnosis   ? ? ?Principal Problem: ?  Community acquired pneumonia ?Active Problems: ?  Hx of CABG Feb 2010 ?  Peripheral arterial disease ([place]) ?  Diabetes ([place]) ?  Essential hypertension ?  COPD (chronic obstructive pulmonary disease) ([place]) ?  AKI (acute kidney injury) ([place]) ?  Acute respiratory failure with hypoxia ([place]) ?  Severe protein-energy malnutrition ([place]) ?  Acidosis, metabolic ?  Pressure injury of skin ?  Cerebral embolism with cerebral infarction ? ? ? ?Discharge instructions   ? ?Discharge Instructions   ? ? Ambulatory referral to Neurology   Complete by: As directed ?  ?  Follow up with stroke clinic NP (Jessica [place] or Cecille Rubin, if both not available, consider Zachery Dauer, or [place]) at [facility] in about 4 weeks. Thanks.  ? Discharge instructions   Complete by: As directed ?  ? Follow with Primary MD Seward Carol, MD in 7 days  ? ?Get CBC, CMP, 2 view Chest X ray -  checked next visit within 1 week by Primary MD   ? ?Activity: As tolerated with Full fall precautions use walker/cane & assistance as needed ? ?Disposition Home  ? ?Diet: Dysphagia 1 diet - pureed with feeding assistance and aspiration precautions. ? ?Accuchecks 4 times/day, Once in AM empty stomach and then before each meal. ?Log in all results and show them to your Prim.MD in 3 days. ?If any glucose reading is under 80 or above 300 call your Prim MD immidiately. ?Follow Low glucose instructions for glucose under 80 as instructed. ? ?Special Instructions: If you have smoked or chewed Tobacco  in the last 2 yrs please stop smoking, stop any regular Alcohol  and or any  Recreational drug use. ? ?On your next visit with your primary care physician please Get Medicines reviewed and adjusted. ? ?Please request your Prim.MD to go over all Hospital Tests and Procedure/Radiological

## 2021-12-31 NOTE — TOC Progression Note (Signed)
Transition of Care (TOC) - Progression Note  ? ? ?Patient Details  ?Name: Reginald Ayers ?MRN: [id number] ?Date of Birth: 1950/12/28 ? ?Transition of Care (TOC) CM/SW Contact  ?Leone Haven, RN ?Phone Number: ?12/31/2021, 8:37 AM ? ?Clinical Narrative:    ?Patient is for dc today, needs oxygen, has no ambulatory sats in yet, he states he has a walker at home and that his niece will transport him home today. ? ? ?Expected Discharge Plan: Home w Home Health Services ?Barriers to Discharge: Continued Medical Work up, SNF Pending bed offer ? ?Expected Discharge Plan and Services ?Expected Discharge Plan: Home w Home Health Services ?In-house Referral: Clinical Social Work ?  ?Post Acute Care Choice: Skilled Nursing Facility ?Living arrangements for the past 2 months: Single Family Home ?Expected Discharge Date: 12/31/21               ?  ?  ?  ?  ?  ?  ?  ?  ?  ?  ? ? ?Social Determinants of Health (SDOH) Interventions ?  ? ?Readmission Risk Interventions ?   ? View : No data to display.  ?  ?  ?  ? ? ?

## 2022-01-01 DIAGNOSIS — Z794 Long term (current) use of insulin: Secondary | ICD-10-CM | POA: Diagnosis not present

## 2022-01-01 DIAGNOSIS — E1122 Type 2 diabetes mellitus with diabetic chronic kidney disease: Secondary | ICD-10-CM | POA: Diagnosis not present

## 2022-01-01 DIAGNOSIS — Z7902 Long term (current) use of antithrombotics/antiplatelets: Secondary | ICD-10-CM | POA: Diagnosis not present

## 2022-01-01 DIAGNOSIS — J449 Chronic obstructive pulmonary disease, unspecified: Secondary | ICD-10-CM | POA: Diagnosis not present

## 2022-01-01 DIAGNOSIS — Z7982 Long term (current) use of aspirin: Secondary | ICD-10-CM | POA: Diagnosis not present

## 2022-01-01 DIAGNOSIS — I129 Hypertensive chronic kidney disease with stage 1 through stage 4 chronic kidney disease, or unspecified chronic kidney disease: Secondary | ICD-10-CM | POA: Diagnosis not present

## 2022-01-01 DIAGNOSIS — Z48812 Encounter for surgical aftercare following surgery on the circulatory system: Secondary | ICD-10-CM | POA: Diagnosis not present

## 2022-01-01 DIAGNOSIS — Z8701 Personal history of pneumonia (recurrent): Secondary | ICD-10-CM | POA: Diagnosis not present

## 2022-01-01 DIAGNOSIS — E785 Hyperlipidemia, unspecified: Secondary | ICD-10-CM | POA: Diagnosis not present

## 2022-01-01 DIAGNOSIS — I480 Paroxysmal atrial fibrillation: Secondary | ICD-10-CM | POA: Diagnosis not present

## 2022-01-01 DIAGNOSIS — Z7901 Long term (current) use of anticoagulants: Secondary | ICD-10-CM | POA: Diagnosis not present

## 2022-01-01 DIAGNOSIS — N1831 Chronic kidney disease, stage 3a: Secondary | ICD-10-CM | POA: Diagnosis not present

## 2022-01-01 DIAGNOSIS — I2510 Atherosclerotic heart disease of native coronary artery without angina pectoris: Secondary | ICD-10-CM | POA: Diagnosis not present

## 2022-01-01 DIAGNOSIS — E1151 Type 2 diabetes mellitus with diabetic peripheral angiopathy without gangrene: Secondary | ICD-10-CM | POA: Diagnosis not present

## 2022-01-01 DIAGNOSIS — E43 Unspecified severe protein-calorie malnutrition: Secondary | ICD-10-CM | POA: Diagnosis not present

## 2022-01-01 DIAGNOSIS — Z9981 Dependence on supplemental oxygen: Secondary | ICD-10-CM | POA: Diagnosis not present

## 2022-01-01 DIAGNOSIS — Z7984 Long term (current) use of oral hypoglycemic drugs: Secondary | ICD-10-CM | POA: Diagnosis not present

## 2022-01-01 DIAGNOSIS — I739 Peripheral vascular disease, unspecified: Secondary | ICD-10-CM | POA: Diagnosis not present

## 2022-01-01 DIAGNOSIS — D649 Anemia, unspecified: Secondary | ICD-10-CM | POA: Diagnosis not present

## 2022-01-05 DIAGNOSIS — I2510 Atherosclerotic heart disease of native coronary artery without angina pectoris: Secondary | ICD-10-CM | POA: Diagnosis not present

## 2022-01-05 DIAGNOSIS — E1151 Type 2 diabetes mellitus with diabetic peripheral angiopathy without gangrene: Secondary | ICD-10-CM | POA: Diagnosis not present

## 2022-01-05 DIAGNOSIS — E1122 Type 2 diabetes mellitus with diabetic chronic kidney disease: Secondary | ICD-10-CM | POA: Diagnosis not present

## 2022-01-05 DIAGNOSIS — I129 Hypertensive chronic kidney disease with stage 1 through stage 4 chronic kidney disease, or unspecified chronic kidney disease: Secondary | ICD-10-CM | POA: Diagnosis not present

## 2022-01-05 DIAGNOSIS — I739 Peripheral vascular disease, unspecified: Secondary | ICD-10-CM | POA: Diagnosis not present

## 2022-01-05 DIAGNOSIS — Z48812 Encounter for surgical aftercare following surgery on the circulatory system: Secondary | ICD-10-CM | POA: Diagnosis not present

## 2022-01-06 DIAGNOSIS — E1122 Type 2 diabetes mellitus with diabetic chronic kidney disease: Secondary | ICD-10-CM | POA: Diagnosis not present

## 2022-01-06 DIAGNOSIS — I739 Peripheral vascular disease, unspecified: Secondary | ICD-10-CM | POA: Diagnosis not present

## 2022-01-06 DIAGNOSIS — E1151 Type 2 diabetes mellitus with diabetic peripheral angiopathy without gangrene: Secondary | ICD-10-CM | POA: Diagnosis not present

## 2022-01-06 DIAGNOSIS — I2510 Atherosclerotic heart disease of native coronary artery without angina pectoris: Secondary | ICD-10-CM | POA: Diagnosis not present

## 2022-01-06 DIAGNOSIS — Z48812 Encounter for surgical aftercare following surgery on the circulatory system: Secondary | ICD-10-CM | POA: Diagnosis not present

## 2022-01-06 DIAGNOSIS — I129 Hypertensive chronic kidney disease with stage 1 through stage 4 chronic kidney disease, or unspecified chronic kidney disease: Secondary | ICD-10-CM | POA: Diagnosis not present

## 2022-01-07 ENCOUNTER — Telehealth: Payer: Self-pay

## 2022-01-07 DIAGNOSIS — Z48812 Encounter for surgical aftercare following surgery on the circulatory system: Secondary | ICD-10-CM | POA: Diagnosis not present

## 2022-01-07 DIAGNOSIS — I2510 Atherosclerotic heart disease of native coronary artery without angina pectoris: Secondary | ICD-10-CM | POA: Diagnosis not present

## 2022-01-07 DIAGNOSIS — E1122 Type 2 diabetes mellitus with diabetic chronic kidney disease: Secondary | ICD-10-CM | POA: Diagnosis not present

## 2022-01-07 DIAGNOSIS — I739 Peripheral vascular disease, unspecified: Secondary | ICD-10-CM | POA: Diagnosis not present

## 2022-01-07 DIAGNOSIS — I129 Hypertensive chronic kidney disease with stage 1 through stage 4 chronic kidney disease, or unspecified chronic kidney disease: Secondary | ICD-10-CM | POA: Diagnosis not present

## 2022-01-07 DIAGNOSIS — E1151 Type 2 diabetes mellitus with diabetic peripheral angiopathy without gangrene: Secondary | ICD-10-CM | POA: Diagnosis not present

## 2022-01-07 NOTE — Telephone Encounter (Signed)
Pt's [facility] nurse Yolanda left [place] a message that she was unable to see pt for his nurse visit today due to him having dogs and she was not okay with this. They are going to send a different [facility] nurse out for future visits. I have tried to reach pt to see how he was doing s/p surgery on 5.05.23 but his voicemail is full and unable to leave message.  ?

## 2022-01-10 ENCOUNTER — Encounter (HOSPITAL_COMMUNITY): Payer: Self-pay

## 2022-01-10 ENCOUNTER — Other Ambulatory Visit: Payer: Self-pay

## 2022-01-10 ENCOUNTER — Emergency Department (HOSPITAL_COMMUNITY): Payer: Medicare Other

## 2022-01-10 ENCOUNTER — Inpatient Hospital Stay (HOSPITAL_COMMUNITY)
Admission: EM | Admit: 2022-01-10 | Discharge: 2022-01-12 | DRG: 193 | Disposition: A | Discharge status: Home Health | Payer: Medicare Other | Attending: Family Medicine | Admitting: Family Medicine

## 2022-01-10 DIAGNOSIS — Z8249 Family history of ischemic heart disease and other diseases of the circulatory system: Secondary | ICD-10-CM

## 2022-01-10 DIAGNOSIS — J9621 Acute and chronic respiratory failure with hypoxia: Secondary | ICD-10-CM | POA: Diagnosis present

## 2022-01-10 DIAGNOSIS — Z833 Family history of diabetes mellitus: Secondary | ICD-10-CM

## 2022-01-10 DIAGNOSIS — Z823 Family history of stroke: Secondary | ICD-10-CM | POA: Diagnosis not present

## 2022-01-10 DIAGNOSIS — Z20822 Contact with and (suspected) exposure to covid-19: Secondary | ICD-10-CM | POA: Diagnosis present

## 2022-01-10 DIAGNOSIS — J189 Pneumonia, unspecified organism: Secondary | ICD-10-CM | POA: Diagnosis not present

## 2022-01-10 DIAGNOSIS — Z48812 Encounter for surgical aftercare following surgery on the circulatory system: Secondary | ICD-10-CM | POA: Diagnosis not present

## 2022-01-10 DIAGNOSIS — Z95820 Peripheral vascular angioplasty status with implants and grafts: Secondary | ICD-10-CM | POA: Diagnosis not present

## 2022-01-10 DIAGNOSIS — R0602 Shortness of breath: Principal | ICD-10-CM

## 2022-01-10 DIAGNOSIS — Z7982 Long term (current) use of aspirin: Secondary | ICD-10-CM

## 2022-01-10 DIAGNOSIS — I130 Hypertensive heart and chronic kidney disease with heart failure and stage 1 through stage 4 chronic kidney disease, or unspecified chronic kidney disease: Secondary | ICD-10-CM | POA: Diagnosis present

## 2022-01-10 DIAGNOSIS — Z7984 Long term (current) use of oral hypoglycemic drugs: Secondary | ICD-10-CM

## 2022-01-10 DIAGNOSIS — Z681 Body mass index (BMI) 19 or less, adult: Secondary | ICD-10-CM

## 2022-01-10 DIAGNOSIS — D509 Iron deficiency anemia, unspecified: Secondary | ICD-10-CM | POA: Diagnosis not present

## 2022-01-10 DIAGNOSIS — J432 Centrilobular emphysema: Secondary | ICD-10-CM | POA: Diagnosis present

## 2022-01-10 DIAGNOSIS — R54 Age-related physical debility: Secondary | ICD-10-CM | POA: Diagnosis present

## 2022-01-10 DIAGNOSIS — Z794 Long term (current) use of insulin: Secondary | ICD-10-CM | POA: Diagnosis not present

## 2022-01-10 DIAGNOSIS — E43 Unspecified severe protein-calorie malnutrition: Secondary | ICD-10-CM | POA: Diagnosis present

## 2022-01-10 DIAGNOSIS — E1151 Type 2 diabetes mellitus with diabetic peripheral angiopathy without gangrene: Secondary | ICD-10-CM | POA: Diagnosis not present

## 2022-01-10 DIAGNOSIS — K219 Gastro-esophageal reflux disease without esophagitis: Secondary | ICD-10-CM | POA: Diagnosis present

## 2022-01-10 DIAGNOSIS — F17220 Nicotine dependence, chewing tobacco, uncomplicated: Secondary | ICD-10-CM | POA: Diagnosis present

## 2022-01-10 DIAGNOSIS — I5022 Chronic systolic (congestive) heart failure: Secondary | ICD-10-CM | POA: Diagnosis present

## 2022-01-10 DIAGNOSIS — E785 Hyperlipidemia, unspecified: Secondary | ICD-10-CM | POA: Diagnosis not present

## 2022-01-10 DIAGNOSIS — I959 Hypotension, unspecified: Secondary | ICD-10-CM | POA: Diagnosis not present

## 2022-01-10 DIAGNOSIS — Z8673 Personal history of transient ischemic attack (TIA), and cerebral infarction without residual deficits: Secondary | ICD-10-CM

## 2022-01-10 DIAGNOSIS — I2510 Atherosclerotic heart disease of native coronary artery without angina pectoris: Secondary | ICD-10-CM | POA: Diagnosis not present

## 2022-01-10 DIAGNOSIS — N1831 Chronic kidney disease, stage 3a: Secondary | ICD-10-CM | POA: Diagnosis present

## 2022-01-10 DIAGNOSIS — J90 Pleural effusion, not elsewhere classified: Secondary | ICD-10-CM | POA: Diagnosis not present

## 2022-01-10 DIAGNOSIS — I480 Paroxysmal atrial fibrillation: Secondary | ICD-10-CM | POA: Diagnosis not present

## 2022-01-10 DIAGNOSIS — R0902 Hypoxemia: Secondary | ICD-10-CM | POA: Diagnosis not present

## 2022-01-10 DIAGNOSIS — Z888 Allergy status to other drugs, medicaments and biological substances status: Secondary | ICD-10-CM

## 2022-01-10 DIAGNOSIS — J449 Chronic obstructive pulmonary disease, unspecified: Secondary | ICD-10-CM | POA: Diagnosis present

## 2022-01-10 DIAGNOSIS — Y95 Nosocomial condition: Secondary | ICD-10-CM | POA: Diagnosis present

## 2022-01-10 DIAGNOSIS — E1122 Type 2 diabetes mellitus with diabetic chronic kidney disease: Secondary | ICD-10-CM | POA: Diagnosis present

## 2022-01-10 DIAGNOSIS — R739 Hyperglycemia, unspecified: Secondary | ICD-10-CM | POA: Diagnosis not present

## 2022-01-10 DIAGNOSIS — Z951 Presence of aortocoronary bypass graft: Secondary | ICD-10-CM | POA: Diagnosis not present

## 2022-01-10 DIAGNOSIS — J439 Emphysema, unspecified: Secondary | ICD-10-CM | POA: Diagnosis not present

## 2022-01-10 DIAGNOSIS — Z7901 Long term (current) use of anticoagulants: Secondary | ICD-10-CM | POA: Diagnosis not present

## 2022-01-10 DIAGNOSIS — I739 Peripheral vascular disease, unspecified: Secondary | ICD-10-CM | POA: Diagnosis not present

## 2022-01-10 DIAGNOSIS — Z79899 Other long term (current) drug therapy: Secondary | ICD-10-CM

## 2022-01-10 DIAGNOSIS — I129 Hypertensive chronic kidney disease with stage 1 through stage 4 chronic kidney disease, or unspecified chronic kidney disease: Secondary | ICD-10-CM | POA: Diagnosis not present

## 2022-01-10 DIAGNOSIS — J811 Chronic pulmonary edema: Secondary | ICD-10-CM | POA: Diagnosis not present

## 2022-01-10 DIAGNOSIS — J9811 Atelectasis: Secondary | ICD-10-CM | POA: Diagnosis not present

## 2022-01-10 DIAGNOSIS — R059 Cough, unspecified: Secondary | ICD-10-CM | POA: Diagnosis not present

## 2022-01-10 LAB — I-STAT VENOUS BLOOD GAS, ED
Acid-base deficit: 1 mmol/L (ref 0.0–2.0)
Bicarbonate: 24.9 mmol/L (ref 20.0–28.0)
Calcium, Ion: 1.18 mmol/L (ref 1.15–1.40)
HCT: 31 % — ABNORMAL LOW (ref 39.0–52.0)
Hemoglobin: 10.5 g/dL — ABNORMAL LOW (ref 13.0–17.0)
O2 Saturation: 98 %
Potassium: 4.2 mmol/L (ref 3.5–5.1)
Sodium: 131 mmol/L — ABNORMAL LOW (ref 135–145)
TCO2: 26 mmol/L (ref 22–32)
pCO2, Ven: 47.5 mmHg (ref 44–60)
pH, Ven: 7.327 (ref 7.25–7.43)
pO2, Ven: 113 mmHg — ABNORMAL HIGH (ref 32–45)

## 2022-01-10 LAB — CBC WITH DIFFERENTIAL/PLATELET
Abs Immature Granulocytes: 0.04 10*3/uL (ref 0.00–0.07)
Basophils Absolute: 0 10*3/uL (ref 0.0–0.1)
Basophils Relative: 0 %
Eosinophils Absolute: 0 10*3/uL (ref 0.0–0.5)
Eosinophils Relative: 0 %
HCT: 30.4 % — ABNORMAL LOW (ref 39.0–52.0)
Hemoglobin: 9.2 g/dL — ABNORMAL LOW (ref 13.0–17.0)
Immature Granulocytes: 0 %
Lymphocytes Relative: 15 %
Lymphs Abs: 2.1 10*3/uL (ref 0.7–4.0)
MCH: 29.2 pg (ref 26.0–34.0)
MCHC: 30.3 g/dL (ref 30.0–36.0)
MCV: 96.5 fL (ref 80.0–100.0)
Monocytes Absolute: 1.6 10*3/uL — ABNORMAL HIGH (ref 0.1–1.0)
Monocytes Relative: 11 %
Neutro Abs: 10.3 10*3/uL — ABNORMAL HIGH (ref 1.7–7.7)
Neutrophils Relative %: 74 %
Platelets: 416 10*3/uL — ABNORMAL HIGH (ref 150–400)
RBC: 3.15 MIL/uL — ABNORMAL LOW (ref 4.22–5.81)
RDW: 13.9 % (ref 11.5–15.5)
WBC: 14 10*3/uL — ABNORMAL HIGH (ref 4.0–10.5)
nRBC: 0 % (ref 0.0–0.2)

## 2022-01-10 LAB — GLUCOSE, CAPILLARY
Glucose-Capillary: 355 mg/dL — ABNORMAL HIGH (ref 70–99)
Glucose-Capillary: 253 mg/dL — ABNORMAL HIGH (ref 70–99)

## 2022-01-10 LAB — BRAIN NATRIURETIC PEPTIDE: B Natriuretic Peptide: 415.2 pg/mL — ABNORMAL HIGH (ref 0.0–100.0)

## 2022-01-10 LAB — COMPREHENSIVE METABOLIC PANEL
ALT: 18 U/L (ref 0–44)
AST: 18 U/L (ref 15–41)
Albumin: 2.5 g/dL — ABNORMAL LOW (ref 3.5–5.0)
Alkaline Phosphatase: 84 U/L (ref 38–126)
Anion gap: 10 (ref 5–15)
BUN: 25 mg/dL — ABNORMAL HIGH (ref 8–23)
CO2: 23 mmol/L (ref 22–32)
Calcium: 9 mg/dL (ref 8.9–10.3)
Chloride: 98 mmol/L (ref 98–111)
Creatinine, Ser: 1.79 mg/dL — ABNORMAL HIGH (ref 0.61–1.24)
GFR, Estimated: 40 mL/min — ABNORMAL LOW (ref >60)
Glucose, Bld: 304 mg/dL — ABNORMAL HIGH (ref 70–99)
Potassium: 4.2 mmol/L (ref 3.5–5.1)
Sodium: 131 mmol/L — ABNORMAL LOW (ref 135–145)
Total Bilirubin: 0.5 mg/dL (ref 0.3–1.2)
Total Protein: 6.7 g/dL (ref 6.5–8.1)

## 2022-01-10 LAB — MRSA NEXT GEN BY PCR, NASAL: MRSA by PCR Next Gen: DETECTED — AB

## 2022-01-10 LAB — RESP PANEL BY RT-PCR (FLU A&B, COVID) ARPGX2
Influenza A by PCR: NEGATIVE
Influenza B by PCR: NEGATIVE
SARS Coronavirus 2 by RT PCR: NEGATIVE

## 2022-01-10 LAB — PROCALCITONIN: Procalcitonin: 5.88 ng/mL

## 2022-01-10 LAB — LACTIC ACID, PLASMA
Lactic Acid, Venous: 1.8 mmol/L (ref 0.5–1.9)
Lactic Acid, Venous: 2 mmol/L (ref 0.5–1.9)
Lactic Acid, Venous: 1.6 mmol/L (ref 0.5–1.9)

## 2022-01-10 LAB — TROPONIN I (HIGH SENSITIVITY)
Troponin I (High Sensitivity): 39 ng/L — ABNORMAL HIGH (ref <18)
Troponin I (High Sensitivity): 36 ng/L — ABNORMAL HIGH (ref <18)

## 2022-01-10 IMAGING — CT CT CHEST W/O CM
2 of 4 series · 11 of 36 positions shown, 13 images · non-contrast
Comparison: Same day chest radiograph and chest CT dated
[DATE].

CLINICAL DATA: Pneumonia.



[Series 3: chest wo · axial · 0.60mm/px · z∈[-343,-33]mm · 8 of 182 slices shown, 10 images]
[im 14/182  mediastinal]
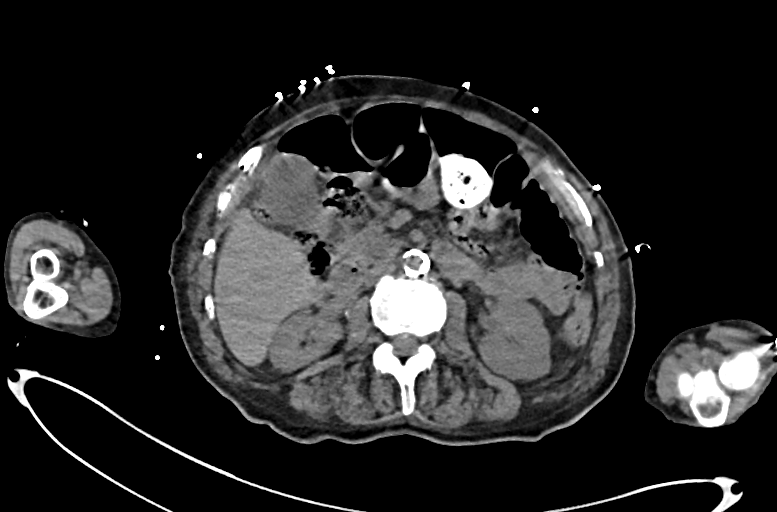
[im 14/182  lung]
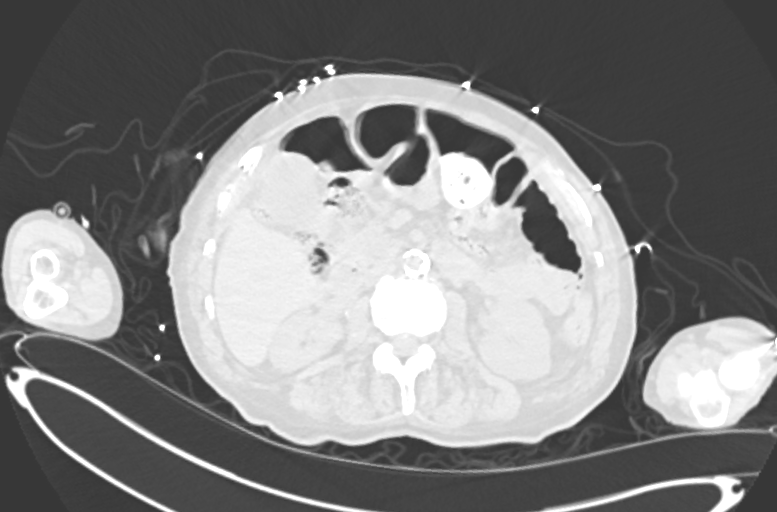
[im 42/182  lung]
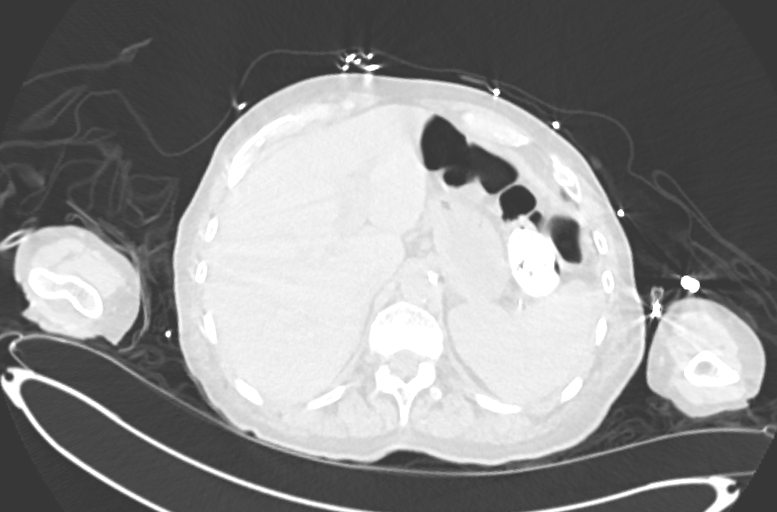
[im 56/182  lung]
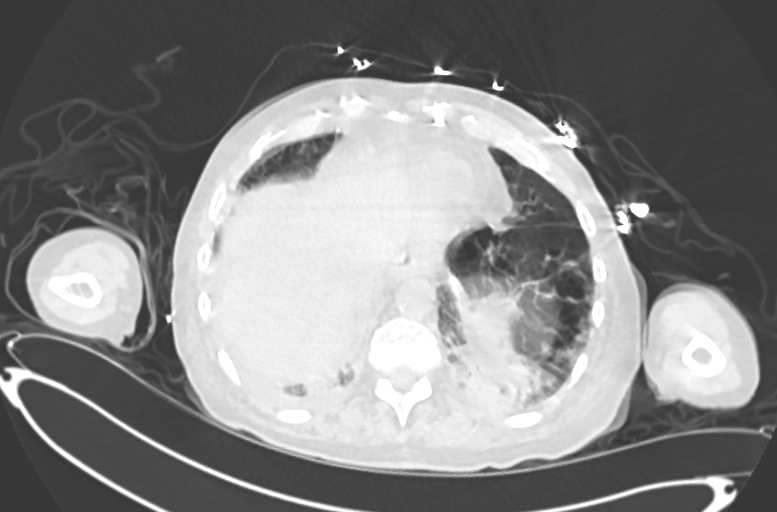
[im 84/182  lung]
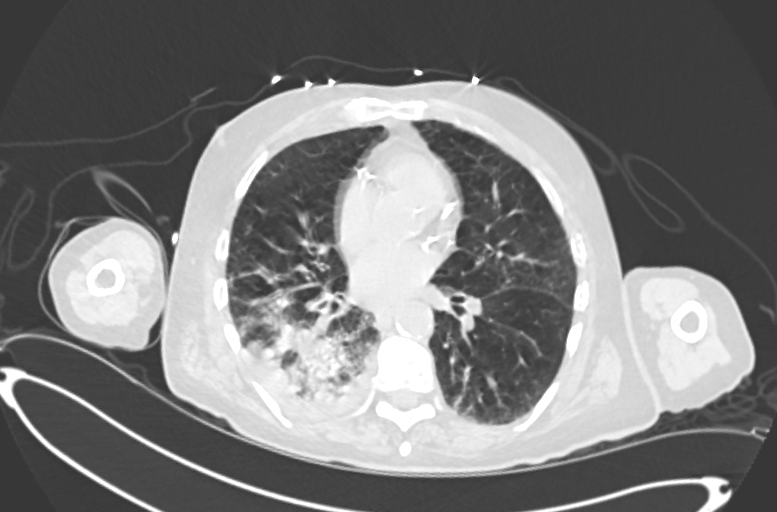
[im 98/182  mediastinal]
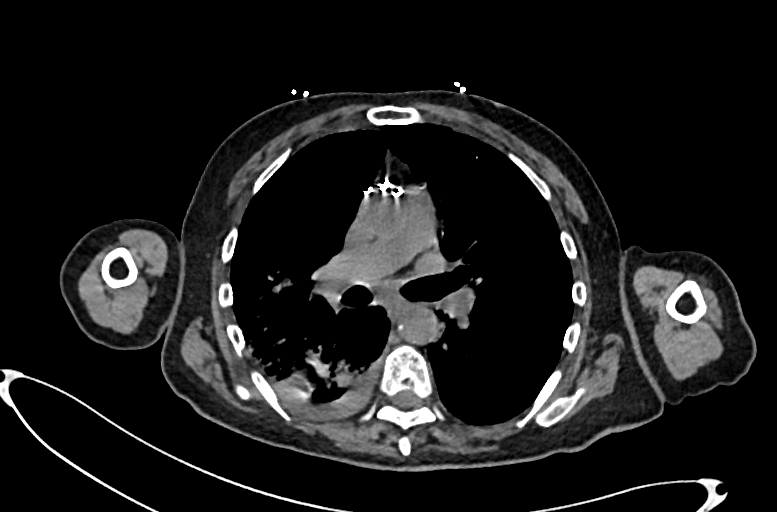
[im 98/182  lung]
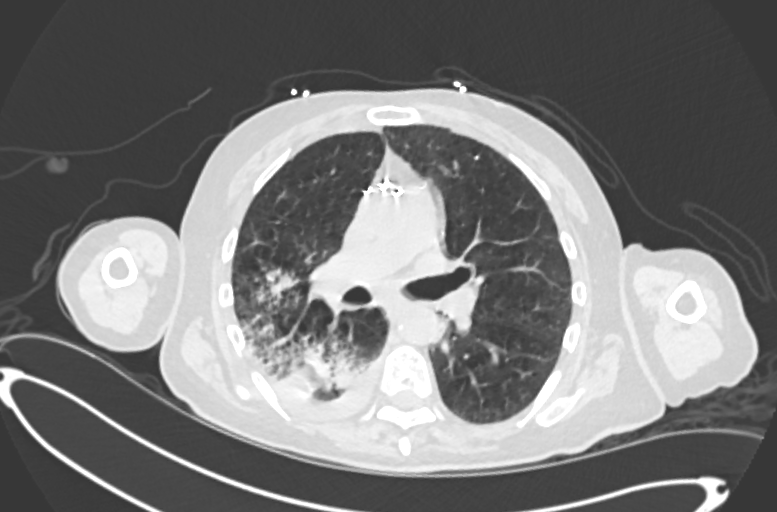
[im 126/182  lung]
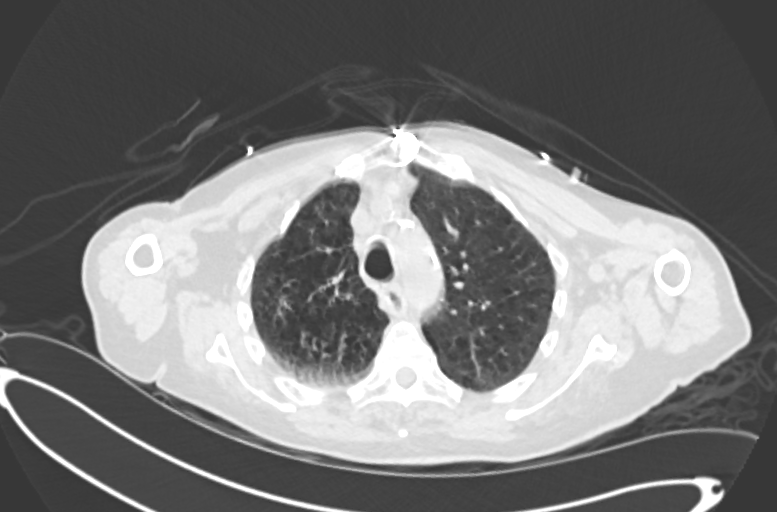
[im 140/182  lung]
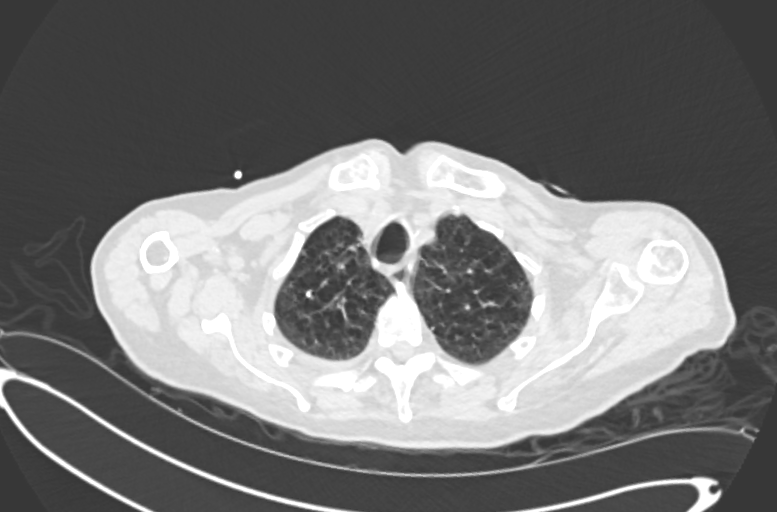
[im 168/182  lung]
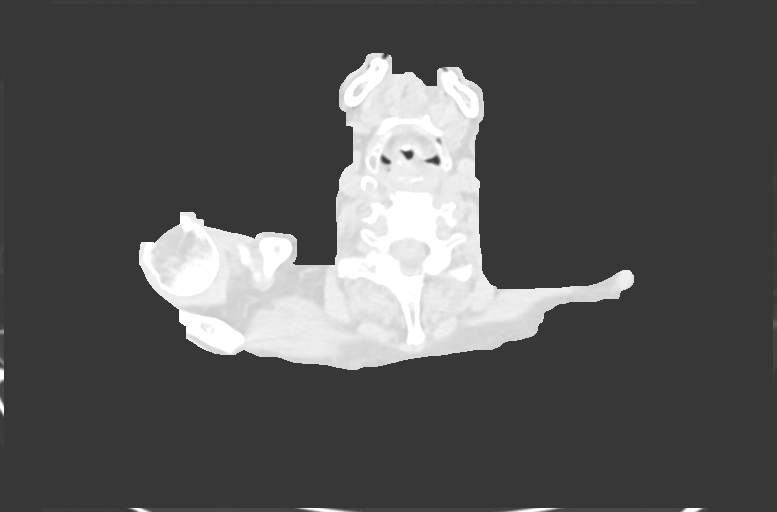

[Series 6: cor · coronal · 0.71mm/px · 3 of 153 slices shown]
[im 31/153  lung]
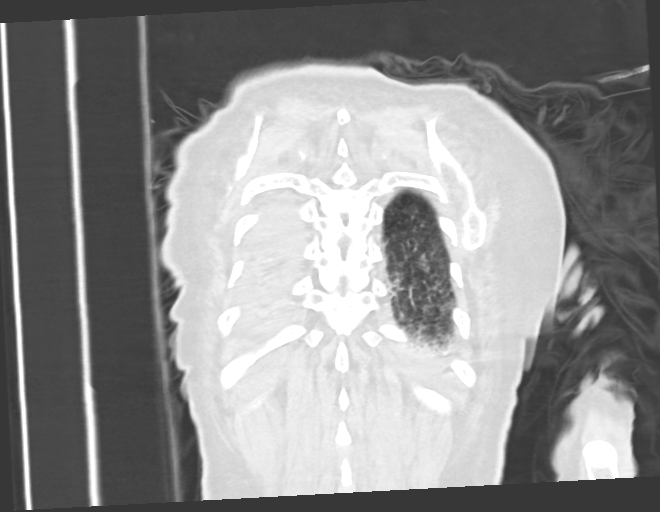
[im 61/153  lung]
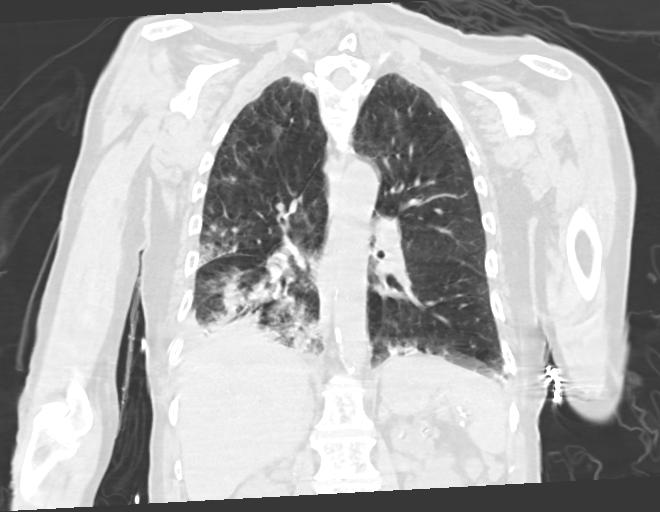
[im 92/153  lung]
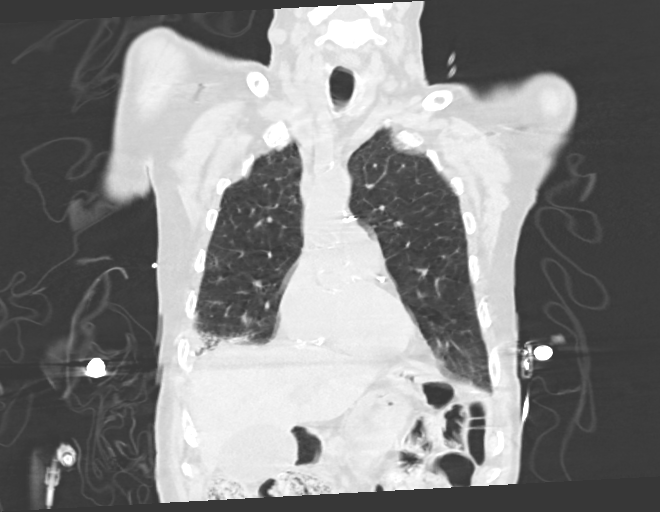

[11 of 36 positions shown; findings below may reference images not displayed]

FINDINGS: Cardiovascular: The right and left pulmonary arteries are enlarged,
measuring 2.1 cm and 1.9 cm respectively. Vascular calcifications
are seen in the coronary arteries and aortic arch. The heart size is
normal. There is no pericardial effusion.

Mediastinum/Nodes: No enlarged mediastinal or axillary lymph nodes.
Thyroid gland, trachea, and esophagus demonstrate no significant
findings.

Lungs/Pleura: There is a small right pleural effusion with
associated atelectasis. Moderate to severe ground-glass opacities
and consolidation are seen the right lower lobe and to a lesser
extent the left lower lobe, left upper lobe, right middle lobe, and
right upper lobe. This has increased since [DATE]. There is no
left pleural effusion. There is no pneumothorax. Centrilobular and
paraseptal emphysema is redemonstrated.

Upper Abdomen: No acute abnormality.

Musculoskeletal: Degenerative changes are seen in the spine.
IMPRESSION: 1. Ground-glass opacities and consolidation are moderate-to-severe
in the right lower lobe and mild in the remaining portions of the
lungs. This has increased since [DATE]. These findings are
suspicious for pneumonia, however malignancy is difficult to
exclude.
2. Enlarged left and right pulmonary arteries, suggestive of
pulmonary hypertension.

Aortic Atherosclerosis ([FS]-[FS]) and Emphysema ([FS]-[FS]).

## 2022-01-10 IMAGING — DX DG CHEST 1V PORT
1 series · 1 of 1 positions shown · non-contrast
Comparison: [DATE] and prior studies

CLINICAL DATA: Shortness of breath

EXAM:
PORTABLE CHEST 1 VIEW

[chest]
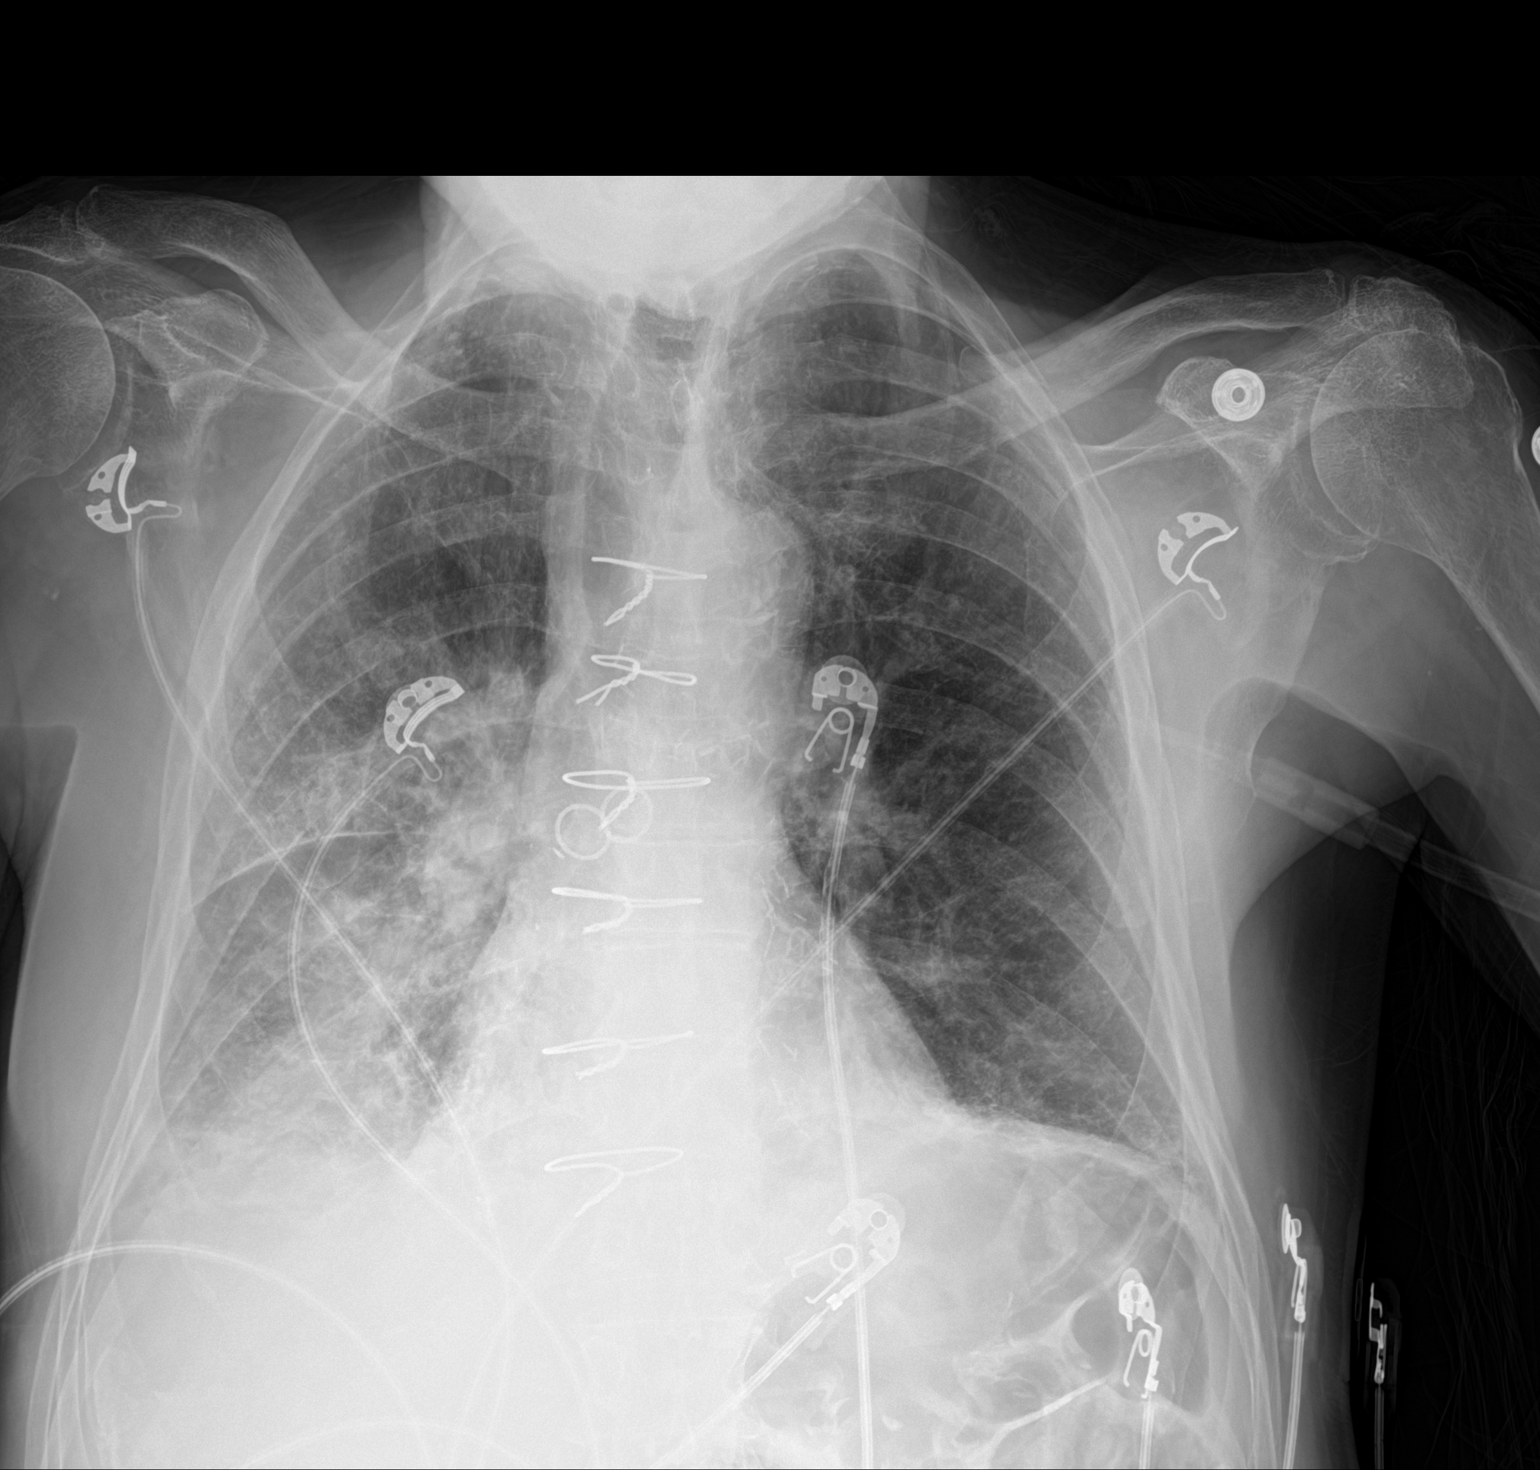

[1 of 1 positions shown; findings below may reference images not displayed]

FINDINGS: Unchanged cardiomediastinal silhouette and CABG changes are again
noted.

Airspace opacities within the RIGHT lung are again noted.

Trace RIGHT pleural effusion again noted.

Mild LEFT basilar atelectasis again identified.

Decreased pulmonary vascular congestion noted.

No pneumothorax or acute bony abnormality noted.
IMPRESSION: Unchanged RIGHT lung airspace opacities and trace RIGHT pleural
effusion.

Decreased pulmonary vascular congestion.

## 2022-01-10 MED ORDER — INSULIN ASPART 100 UNIT/ML IJ SOLN
0.0000 [IU] | Freq: Every day | INTRAMUSCULAR | Status: DC
  Administered 2022-01-10 – 2022-01-11 (×2): 3 [IU] via SUBCUTANEOUS

## 2022-01-10 MED ORDER — ENSURE ENLIVE PO LIQD
237.0000 mL | Freq: Two times a day (BID) | ORAL | Status: DC
  Administered 2022-01-11 (×2): 237 mL via ORAL

## 2022-01-10 MED ORDER — PANTOPRAZOLE SODIUM 40 MG PO TBEC
40.0000 mg | DELAYED_RELEASE_TABLET | Freq: Every day | ORAL | Status: DC
  Administered 2022-01-11 – 2022-01-12 (×2): 40 mg via ORAL
  Filled 2022-01-10 (×2): qty 1

## 2022-01-10 MED ORDER — VANCOMYCIN HCL 750 MG/150ML IV SOLN
750.0000 mg | INTRAVENOUS | Status: DC
  Filled 2022-01-10: qty 150

## 2022-01-10 MED ORDER — IPRATROPIUM-ALBUTEROL 0.5-2.5 (3) MG/3ML IN SOLN
3.0000 mL | Freq: Four times a day (QID) | RESPIRATORY_TRACT | Status: DC
  Administered 2022-01-10 – 2022-01-11 (×2): 3 mL via RESPIRATORY_TRACT
  Filled 2022-01-10 (×3): qty 3

## 2022-01-10 MED ORDER — CHLORHEXIDINE GLUCONATE CLOTH 2 % EX PADS
6.0000 | MEDICATED_PAD | Freq: Every day | CUTANEOUS | Status: DC
  Administered 2022-01-11 – 2022-01-12 (×2): 6 via TOPICAL

## 2022-01-10 MED ORDER — BASAGLAR KWIKPEN 100 UNIT/ML ~~LOC~~ SOPN: 15.0000 [IU] | PEN_INJECTOR | Freq: Every day | SUBCUTANEOUS | Status: DC

## 2022-01-10 MED ORDER — FUROSEMIDE 20 MG PO TABS
20.0000 mg | ORAL_TABLET | Freq: Every day | ORAL | Status: DC
  Administered 2022-01-10 – 2022-01-12 (×3): 20 mg via ORAL
  Filled 2022-01-10 (×3): qty 1

## 2022-01-10 MED ORDER — PREDNISONE 20 MG PO TABS
40.0000 mg | ORAL_TABLET | Freq: Every day | ORAL | Status: DC
  Administered 2022-01-11 – 2022-01-12 (×2): 40 mg via ORAL
  Filled 2022-01-10 (×2): qty 2

## 2022-01-10 MED ORDER — FUROSEMIDE 10 MG/ML IJ SOLN
20.0000 mg | Freq: Every day | INTRAMUSCULAR | Status: DC
Start: 2022-01-11 — End: 2022-01-10

## 2022-01-10 MED ORDER — GUAIFENESIN ER 600 MG PO TB12
1200.0000 mg | ORAL_TABLET | Freq: Two times a day (BID) | ORAL | Status: DC
  Administered 2022-01-10 – 2022-01-12 (×4): 1200 mg via ORAL
  Filled 2022-01-10 (×4): qty 2

## 2022-01-10 MED ORDER — BUDESONIDE 0.5 MG/2ML IN SUSP
2.0000 mg | Freq: Two times a day (BID) | RESPIRATORY_TRACT | Status: DC
  Administered 2022-01-10: 2 mg via RESPIRATORY_TRACT
  Filled 2022-01-10 (×5): qty 8

## 2022-01-10 MED ORDER — ASPIRIN 81 MG PO TBEC
81.0000 mg | DELAYED_RELEASE_TABLET | Freq: Every day | ORAL | Status: DC
  Administered 2022-01-11 – 2022-01-12 (×2): 81 mg via ORAL
  Filled 2022-01-10 (×2): qty 1

## 2022-01-10 MED ORDER — CEFEPIME HCL 2 G IV SOLR
2.0000 g | INTRAVENOUS | Status: DC
  Administered 2022-01-11: 2 g via INTRAVENOUS
  Filled 2022-01-10: qty 12.5

## 2022-01-10 MED ORDER — METOPROLOL TARTRATE 25 MG PO TABS
37.5000 mg | ORAL_TABLET | Freq: Two times a day (BID) | ORAL | Status: DC
  Administered 2022-01-11 – 2022-01-12 (×3): 37.5 mg via ORAL
  Filled 2022-01-10 (×3): qty 2

## 2022-01-10 MED ORDER — FERROUS SULFATE 325 (65 FE) MG PO TABS
324.0000 mg | ORAL_TABLET | Freq: Every day | ORAL | Status: DC
  Administered 2022-01-11 – 2022-01-12 (×2): 324 mg via ORAL
  Filled 2022-01-10 (×2): qty 1

## 2022-01-10 MED ORDER — VITAMIN B-12 1000 MCG PO TABS
1000.0000 ug | ORAL_TABLET | Freq: Every day | ORAL | Status: DC
  Administered 2022-01-11 – 2022-01-12 (×2): 1000 ug via ORAL
  Filled 2022-01-10 (×2): qty 1

## 2022-01-10 MED ORDER — ALBUTEROL SULFATE HFA 108 (90 BASE) MCG/ACT IN AERS: 2.0000 | INHALATION_SPRAY | RESPIRATORY_TRACT | Status: DC | PRN

## 2022-01-10 MED ORDER — APIXABAN 2.5 MG PO TABS
2.5000 mg | ORAL_TABLET | Freq: Two times a day (BID) | ORAL | Status: DC
  Administered 2022-01-10 – 2022-01-12 (×4): 2.5 mg via ORAL
  Filled 2022-01-10 (×4): qty 1

## 2022-01-10 MED ORDER — METOPROLOL TARTRATE 5 MG/5ML IV SOLN: 2.5000 mg | Freq: Four times a day (QID) | INTRAVENOUS | Status: DC | PRN

## 2022-01-10 MED ORDER — LINAGLIPTIN 5 MG PO TABS
5.0000 mg | ORAL_TABLET | Freq: Every day | ORAL | Status: DC
  Administered 2022-01-11 – 2022-01-12 (×2): 5 mg via ORAL
  Filled 2022-01-10 (×2): qty 1

## 2022-01-10 MED ORDER — INSULIN ASPART 100 UNIT/ML IJ SOLN
0.0000 [IU] | Freq: Three times a day (TID) | INTRAMUSCULAR | Status: DC
  Administered 2022-01-10: 9 [IU] via SUBCUTANEOUS
  Administered 2022-01-11: 7 [IU] via SUBCUTANEOUS
  Administered 2022-01-11: 9 [IU] via SUBCUTANEOUS
  Administered 2022-01-11 – 2022-01-12 (×2): 3 [IU] via SUBCUTANEOUS

## 2022-01-10 MED ORDER — MUPIROCIN 2 % EX OINT
1.0000 "application " | TOPICAL_OINTMENT | Freq: Two times a day (BID) | CUTANEOUS | Status: DC
  Administered 2022-01-10 – 2022-01-12 (×4): 1 via NASAL
  Filled 2022-01-10 (×2): qty 22

## 2022-01-10 MED ORDER — EMPAGLIFLOZIN 25 MG PO TABS
25.0000 mg | ORAL_TABLET | Freq: Every day | ORAL | Status: DC
  Administered 2022-01-11 – 2022-01-12 (×2): 25 mg via ORAL
  Filled 2022-01-10 (×2): qty 1

## 2022-01-10 MED ORDER — CLOPIDOGREL BISULFATE 75 MG PO TABS
75.0000 mg | ORAL_TABLET | Freq: Every day | ORAL | Status: DC
Start: 2022-01-11 — End: 2022-01-12
  Administered 2022-01-11 – 2022-01-12 (×2): 75 mg via ORAL
  Filled 2022-01-10 (×2): qty 1

## 2022-01-10 MED ORDER — ROSUVASTATIN CALCIUM 20 MG PO TABS
20.0000 mg | ORAL_TABLET | Freq: Every day | ORAL | Status: DC
  Administered 2022-01-11: 20 mg via ORAL
  Filled 2022-01-10: qty 1

## 2022-01-10 MED ORDER — CEFEPIME HCL 2 G IV SOLR
2.0000 g | Freq: Once | INTRAVENOUS | Status: AC
  Administered 2022-01-10: 2 g via INTRAVENOUS
  Filled 2022-01-10: qty 12.5

## 2022-01-10 MED ORDER — TIOTROPIUM BROMIDE MONOHYDRATE 18 MCG IN CAPS: 18.0000 ug | ORAL_CAPSULE | Freq: Every day | RESPIRATORY_TRACT | Status: DC

## 2022-01-10 MED ORDER — VANCOMYCIN HCL IN DEXTROSE 1-5 GM/200ML-% IV SOLN
1000.0000 mg | Freq: Once | INTRAVENOUS | Status: AC
  Administered 2022-01-10: 1000 mg via INTRAVENOUS
  Filled 2022-01-10: qty 200

## 2022-01-10 MED ORDER — MOMETASONE FURO-FORMOTEROL FUM 200-5 MCG/ACT IN AERO
2.0000 | INHALATION_SPRAY | Freq: Two times a day (BID) | RESPIRATORY_TRACT | Status: DC
  Administered 2022-01-11 – 2022-01-12 (×3): 2 via RESPIRATORY_TRACT
  Filled 2022-01-10: qty 8.8

## 2022-01-10 NOTE — Plan of Care (Signed)
  Problem: Education: Goal: Knowledge of disease or condition will improve Outcome: Progressing   Problem: Education: Goal: Knowledge of the prescribed therapeutic regimen will improve Outcome: Progressing   Problem: Activity: Goal: Ability to tolerate increased activity will improve Outcome: Progressing   Problem: Respiratory: Goal: Ability to maintain a clear airway will improve Outcome: Progressing

## 2022-01-10 NOTE — Progress Notes (Signed)
Pharmacy Antibiotic Note  Reginald Ayers is a 71 y.o. male for which pharmacy has been consulted for cefepime and vancomycin dosing for pneumonia.  Patient with a history of PAD, CAD s/p CABG, HTN, HLD, COPD. Patient presenting with shortness of breath and worsening cough.  4/29-5/10 admission following hypoxic respiratory failure 2/2 multifocal pna requiring intubation in ED and ICU admission. Pt also found with sub acute CVA & 80% stenosis of right ICA. Now s/p right carotid stenting on 5/5  SCr 1.79 - baseline ~1.6 WBC 14; LA 2; T 98.4 F; HR 100; RR 22  Plan: Cefepime 2g q24hr Vancomycin 1000 mg once then 750 mg q48hr (eAUC 470) unless change in renal function Trend WBC, Fever, Renal function, & Clinical course F/u cultures, clinical course, WBC, fever De-escalate when able Levels at steady state F/u MRSA PCR  Height: 5\' 1"  (154.9 cm) Weight: 45.8 kg (101 lb) IBW/kg (Calculated) : 52.3  Temp (24hrs), Avg:98.4 F (36.9 C), Min:98.4 F (36.9 C), Max:98.4 F (36.9 C)  No results for input(s): WBC, CREATININE, LATICACIDVEN, VANCOTROUGH, VANCOPEAK, VANCORANDOM, GENTTROUGH, GENTPEAK, GENTRANDOM, TOBRATROUGH, TOBRAPEAK, TOBRARND, AMIKACINPEAK, AMIKACINTROU, AMIKACIN in the last 168 hours.  Estimated Creatinine Clearance: 28.2 mL/min (A) (by C-G formula based on SCr of 1.51 mg/dL (H)).    Allergies  Allergen Reactions   Actos [Pioglitazone] Shortness Of Breath    Leg swelling    Atorvastatin     Other reaction(s): leg pain   Lisinopril Cough    Antimicrobials this admission: cefepime 5/20 >>  vancomycin 5/20 >>   Microbiology results: Pending  Thank you for allowing pharmacy to be a part of this patient's care.  Lorelei Pont, PharmD, BCPS 01/10/2022 2:08 PM ED Clinical Pharmacist -  [phone]

## 2022-01-10 NOTE — ED Triage Notes (Signed)
Pt arrives via EMS from home. Pt c/o shortness of breath and worsening cough. Upon EMS arrival patient was 89% on his baseline 2.5lnc. EMS administered albuterol, atrovent, and 125mg  solumedrol.

## 2022-01-10 NOTE — H&P (Signed)
History and Physical    Reginald Ayers CZY:[id number] DOB: May 27, 1951 DOA: 01/10/2022  PCP: Seward Carol, MD (Confirm with patient/family/NH records and if not entered, this has to be entered at [facility] point of entry) Patient coming from: Home  I have personally briefly reviewed patient's old medical records in [place]  Chief Complaint: Cough, SOB  HPI: Reginald Ayers is a 70 y.o. male with medical history significant of recent right-sided lung pneumonia, recent CVA status post left ICA stenting on aspirin Plavix, PAF on Eliquis, PAD, chronic hypoxic respiratory failure on baseline 2.5 L home O2, PVD, COPD, HTN, HLD, IDDM, chronic borderline systolic CHF, severe protein calorie malnutrition, chronic iron deficiency anemia, CKD stage II, came with worsening of productive cough and hypoxia.  Symptoms started 2 to 3 days ago and currently getting worse, increasing cough with thick yellowish sputum production, and right-sided pleuritic chest pain.  Subjective fever started last night 99 F.  Denies any urinary symptoms, no diarrhea.  Family found patient O2 saturation dropped to mid 80s despite on 2.5 L oxygen this morning and decided to bring patient to ED.  ED Course: Borderline tachycardia, no hypotension, O2 saturation 95% on 2.5 L.  Chest x-ray showed persistent right mid and lower lobe infiltrates consistent for unresolved pneumonia.  WBC 14, creatinine 1.79 compared to baseline 1.5, K4.3, bicarb 23, ABG 7.3 2/47/113.  Patient was given vancomycin and cefepime in the ED.  Review of Systems: As per HPI otherwise 14 point review of systems negative.    Past Medical History:  Diagnosis Date   Carotid artery disease ([place])    L-ICA 100%, mod R-ICA dz   COPD (chronic obstructive pulmonary disease) ([place])    Coronary artery disease    s/p CABG February 2010 by Dr. Merilynn Finland   Diabetes [facility])    Emphysema lung [facility])    Hyperlipidemia    Hypertension    Peripheral arterial  disease ([place])    post left common iliac and right SFA stenting remotely   Tobacco abuse         Past Surgical History:  Procedure Laterality Date   ABDOMINAL AORTOGRAM N/A 04/20/2017   Procedure: ABDOMINAL AORTOGRAM;  Surgeon: Serafina Mitchell, MD;  Location: [place] [facility];  Service: Cardiovascular;  Laterality: N/A;   ABDOMINAL AORTOGRAM W/LOWER EXTREMITY Right 12/08/2016   Procedure: Abdominal Aortogram w/Lower Extremity;  Surgeon: Serafina Mitchell, MD;  Location: [place] [facility];  Service: Cardiovascular;  Laterality: Right;   ABDOMINAL AORTOGRAM W/LOWER EXTREMITY N/A 09/07/2017   Procedure: ABDOMINAL AORTOGRAM W/LOWER EXTREMITY;  Surgeon: Serafina Mitchell, MD;  Location: [place] [facility];  Service: Cardiovascular;  Laterality: N/A;   CARDIAC CATHETERIZATION  2010   CAROTID ANGIOGRAM  2014   CORONARY ARTERY BYPASS GRAFT  2010   LIMA-LAD, Lrad-OM1, SVG-RI, SVG-AM-dRCA   DOPPLER ECHOCARDIOGRAPHY  2010   LOWER EXTREMITY ANGIOGRAM  11/2006   left common femoral endarterectomy and patch angioplasty:The mid right SFA was angioplastied with a 4 x 8   LOWER EXTREMITY ANGIOGRAM  2003   left common iliac artery stenting by Dr. Adora Fridge   LOWER EXTREMITY ANGIOGRAPHY Right 04/20/2017   Procedure: Lower Extremity Angiography;  Surgeon: Serafina Mitchell, MD;  Location: [place] [facility];  Service: Cardiovascular;  Laterality: Right;   NM MYOVIEW LTD  2015   PERIPHERAL VASCULAR ATHERECTOMY Right 12/08/2016   Procedure: Peripheral Vascular Atherectomy;  Surgeon: Serafina Mitchell, MD;  Location: [place] [facility];  Service: Cardiovascular;  Laterality: Right;   PERIPHERAL VASCULAR BALLOON ANGIOPLASTY Right 12/08/2016   Procedure: Peripheral Vascular Balloon Angioplasty;  Surgeon: Serafina Mitchell, MD;  Location: [place] [facility];  Service: Cardiovascular;  Laterality: Right;   PERIPHERAL VASCULAR BALLOON ANGIOPLASTY Right 04/20/2017   Procedure: PERIPHERAL VASCULAR BALLOON ANGIOPLASTY;  Surgeon:  Serafina Mitchell, MD;  Location: [place] [facility];  Service: Cardiovascular;  Laterality: Right;  SFA   PERIPHERAL VASCULAR CATHETERIZATION N/A 09/22/2016   Procedure: Abdominal Aortogram w/ bilateral Lower Extremity Runoff;  Surgeon: Serafina Mitchell, MD;  Location: [place] [facility];  Service: Cardiovascular;  Laterality: N/A;   PERIPHERAL VASCULAR CATHETERIZATION Right 09/22/2016   Procedure: Peripheral Vascular Intervention;  Surgeon: Serafina Mitchell, MD;  Location: [place] [facility];  Service: Cardiovascular;  Laterality: Right;   TRANSCAROTID ARTERY REVASCULARIZATION  Right 12/26/2021   Procedure: Right Transcarotid Artery Revascularization;  Surgeon: Serafina Mitchell, MD;  [facility];  Service: Vascular;  Laterality: Right;   ULTRASOUND GUIDANCE FOR VASCULAR ACCESS Left 12/26/2021   Procedure: ULTRASOUND GUIDANCE FOR VASCULAR ACCESS, LEFT FEMORAL VEIN;  Surgeon: Serafina Mitchell, MD;  Location: [place];  Service: Vascular;  Laterality: Left;     reports that he quit smoking about 5 years ago. His smoking use included cigarettes. His smokeless tobacco use includes chew. He reports that he does not drink alcohol and does not use drugs.  Allergies  Allergen Reactions   Actos [Pioglitazone] Shortness Of Breath    Leg swelling    Atorvastatin     Other reaction(s): leg pain   Lisinopril Cough    Family History  Problem Relation Age of Onset   Heart attack Mother    CVA Mother    Heart disease Mother    Diabetes Sister    Hypertension Sister      Prior to Admission medications   Medication Sig Start Date End Date Taking? Authorizing Provider  albuterol (VENTOLIN HFA) 108 (90 Base) MCG/ACT inhaler Inhale 2 puffs into the lungs every 4 (four) hours as needed. 01/18/20   [provider]  apixaban (ELIQUIS) 2.5 MG TABS tablet Take 1 tablet (2.5 mg total) by mouth 2 (two) times daily. 12/31/21   Thurnell Lose, MD  aspirin 81 MG EC tablet Take 1 tablet (81 mg total) by  mouth daily. Swallow whole. 12/31/21   Thurnell Lose, MD  Blood Glucose Monitoring Suppl (BLOOD GLUCOSE MONITOR SYSTEM) w/Device KIT Used to test blood sugars 4 times daily 12/31/21   Thurnell Lose, MD  clopidogrel (PLAVIX) 75 MG tablet Take 75 mg by mouth daily.  07/24/13   [provider]  Cyanocobalamin (VITAMIN B 12) 500 MCG TABS Take 1,000 mcg by mouth daily.    [provider]  empagliflozin (JARDIANCE) 25 MG TABS tablet Take 25 mg by mouth daily.    [provider]  ferrous sulfate 324 MG TBEC Take 324 mg by mouth daily with breakfast.    [provider]  furosemide (LASIX) 20 MG tablet Take 20 mg by mouth daily.    [provider]  Homeopathic Products (LEG CRAMP RELIEF SL) Place 2 tablets under the tongue at bedtime as needed (leg cramps).    [provider]  insulin aspart (NOVOLOG) 100 UNIT/ML FlexPen Before each meal 3 times a day, 140-199 - 2 units, 200-250 - 4 units, 251-299 - 6 units,  300-349 - 8 units,  350 or above 10 units.  Insulin PEN if  approved, provide syringes and needles if needed. 12/31/21   Thurnell Lose, MD  Insulin Glargine (BASAGLAR KWIKPEN) 100 UNIT/ML Inject 15 Units into the skin daily. 12/31/21   Thurnell Lose, MD  Insulin Pen Needle 32G X 4 MM MISC Use to inject insulin 4 times daily. 12/31/21   Thurnell Lose, MD  metFORMIN (GLUCOPHAGE) 500 MG tablet Take 1,000 mg by mouth 2 (two) times daily with a meal.  07/24/13   [provider]  metoprolol succinate (TOPROL-XL) 25 MG 24 hr tablet TAKE 1 TABLET(25 MG) BY MOUTH DAILY Patient taking differently: Take 25 mg by mouth daily. 07/29/20   Lorretta Harp, MD  Multiple Vitamins-Minerals (CENTRUM SILVER 50+MEN) TABS Take 1 tablet by mouth daily.    [provider]  NITROSTAT 0.4 MG SL tablet Take 0.4 mg by mouth every 5 (five) minutes as needed for chest pain.  12/18/14   [provider]  pantoprazole (PROTONIX) 40 MG tablet  Take 1 tablet (40 mg total) by mouth daily. 12/31/21   Thurnell Lose, MD  Polyvinyl Alcohol-Povidone (MURINE TEARS FOR DRY EYES OP) Apply 1 drop to eye 3 (three) times daily as needed (dry eyes).    [provider]  potassium chloride (K-DUR) 10 MEQ tablet Take 10 mEq by mouth daily.    [provider]  Pseudoeph-Doxylamine-DM-APAP (NYQUIL PO) Take 1 Dose by mouth at bedtime.     [provider]  rosuvastatin (CRESTOR) 20 MG tablet Take 1 tablet (20 mg total) by mouth daily. 12/31/21   Thurnell Lose, MD  SPIRIVA HANDIHALER 18 MCG inhalation capsule Place 18 mcg into inhaler and inhale daily at 8 pm.  08/16/13   [provider]    Physical Exam: Vitals:   01/10/22 1415 01/10/22 1419 01/10/22 1500 01/10/22 1545  BP: (!) 142/54  131/73 116/69  Pulse: (!) 111  (!) 102 (!) 105  Resp: 20  (!) 23 17  Temp:      TempSrc:      SpO2: 94% 95% 98% 97%  Weight:      Height:        Constitutional: NAD, calm, comfortable Vitals:   01/10/22 1415 01/10/22 1419 01/10/22 1500 01/10/22 1545  BP: (!) 142/54  131/73 116/69  Pulse: (!) 111  (!) 102 (!) 105  Resp: 20  (!) 23 17  Temp:      TempSrc:      SpO2: 94% 95% 98% 97%  Weight:      Height:       Eyes: PERRL, lids and conjunctivae normal ENMT: Mucous membranes are moist. Posterior pharynx clear of any exudate or lesions.Normal dentition.  Neck: normal, supple, no masses, no thyromegaly Respiratory: clear to auscultation bilaterally, scattered wheezing right> left, coarse crackles dominantly on the right side of the lungs, increasing breathing effort, talking in broken sentences.  No accessory muscle use.  Cardiovascular: Irregular heart rate, no murmurs / rubs / gallops.  Anasarca dominantly on the posterior side of the body and limbs. 2+ pedal pulses. No carotid bruits.  Abdomen: no tenderness, no masses palpated. No hepatosplenomegaly. Bowel sounds positive.  Musculoskeletal: no clubbing / cyanosis. No  joint deformity upper and lower extremities. Good ROM, no contractures. Normal muscle tone.  Skin: no rashes, lesions, ulcers. No induration Neurologic: CN 2-12 grossly intact. Sensation intact, DTR normal. Strength 5/5 in all 4.  Psychiatric: Normal judgment and insight. Alert and oriented x 3. Normal mood.     Labs on  Admission: I have personally reviewed following labs and imaging studies  CBC: Recent Labs  Lab 01/10/22 1348 01/10/22 1416  WBC 14.0*  --   NEUTROABS 10.3*  --   HGB 9.2* 10.5*  HCT 30.4* 31.0*  MCV 96.5  --   PLT 416*  --    Basic Metabolic Panel: Recent Labs  Lab 01/10/22 1348 01/10/22 1416  NA 131* 131*  K 4.2 4.2  CL 98  --   CO2 23  --   GLUCOSE 304*  --   BUN 25*  --   CREATININE 1.79*  --   CALCIUM 9.0  --    GFR: Estimated Creatinine Clearance: 23.8 mL/min (A) (by C-G formula based on SCr of 1.79 mg/dL (H)). Liver Function Tests: Recent Labs  Lab 01/10/22 1348  AST 18  ALT 18  ALKPHOS 84  BILITOT 0.5  PROT 6.7  ALBUMIN 2.5*   No results for input(s): LIPASE, AMYLASE in the last 168 hours. No results for input(s): AMMONIA in the last 168 hours. Coagulation Profile: No results for input(s): INR, PROTIME in the last 168 hours. Cardiac Enzymes: No results for input(s): CKTOTAL, CKMB, CKMBINDEX, TROPONINI in the last 168 hours. BNP (last 3 results) No results for input(s): PROBNP in the last 8760 hours. HbA1C: No results for input(s): HGBA1C in the last 72 hours. CBG: No results for input(s): GLUCAP in the last 168 hours. Lipid Profile: No results for input(s): CHOL, HDL, LDLCALC, TRIG, CHOLHDL, LDLDIRECT in the last 72 hours. Thyroid Function Tests: No results for input(s): TSH, T4TOTAL, FREET4, T3FREE, THYROIDAB in the last 72 hours. Anemia Panel: No results for input(s): VITAMINB12, FOLATE, FERRITIN, TIBC, IRON, RETICCTPCT in the last 72 hours. Urine analysis:    Component Value Date/Time   COLORURINE YELLOW 12/20/2021 1211    APPEARANCEUR CLEAR 12/20/2021 1211   LABSPEC 1.015 12/20/2021 1211   PHURINE 6.0 12/20/2021 1211   GLUCOSEU >=500 (A) 12/20/2021 1211   HGBUR SMALL (A) 12/20/2021 1211   BILIRUBINUR NEGATIVE 12/20/2021 1211   KETONESUR NEGATIVE 12/20/2021 1211   PROTEINUR 30 (A) 12/20/2021 1211   UROBILINOGEN 0.2 07/07/2015 1842   NITRITE NEGATIVE 12/20/2021 1211   LEUKOCYTESUR NEGATIVE 12/20/2021 1211    Radiological Exams on Admission: DG Chest Port 1 View  Result Date: 01/10/2022 CLINICAL DATA:  Shortness of breath EXAM: PORTABLE CHEST 1 VIEW COMPARISON:  12/29/2021 and prior studies FINDINGS: Unchanged cardiomediastinal silhouette and CABG changes are again noted. Airspace opacities within the RIGHT lung are again noted. Trace RIGHT pleural effusion again noted. Mild LEFT basilar atelectasis again identified. Decreased pulmonary vascular congestion noted. No pneumothorax or acute bony abnormality noted. IMPRESSION: Unchanged RIGHT lung airspace opacities and trace RIGHT pleural effusion. Decreased pulmonary vascular congestion. Electronically Signed   By: Margarette Canada M.D.   On: 01/10/2022 14:13    EKG: Independently reviewed.  A-fib  Assessment/Plan Principal Problem:   HCAP (healthcare-associated pneumonia) Active Problems:   COPD (chronic obstructive pulmonary disease) (HCC)   Severe protein-energy malnutrition ([facility])  (please populate well all problems here in Problem List. (For example, if patient is on BP meds at home and you resume or decide to hold them, it is a problem that needs to be her. Same for CAD, COPD, HLD and so on)   Acute on chronic hypoxic respiratory failure -Suspect HCAP of right middle and lower lobes -CT chest without contrast to rule out complications such as empyema or cavity formation -Continue vancomycin and cefepime, MRSA screening pending, if negative, consider  discontinue vancomycin. -Discussed with family at bedside, no history of aspiration, but given the  patient's overall condition deterioration recently, consult speech evaluation to evaluate aspiration risks. -Supporting measures including oxygen, incentive spirometry, flutter valves.  Culture sputum  Acute COPD exacerbation -Probably triggered by the pneumonia -Escalate breathing treatment DuoNebs, as needed Atrovent -Start Breo, Pulmicort  Uncontrolled A-fib -Increase metoprolol to 37.5 mg twice daily -Continue Eliquis  Recent left temporal stroke and left ICA stenosis status post left ICA stenting -As per discharge summary on May 10, patient will need a total of 30 days dual antiplatelet regimen of aspirin and Plavix.  We will continue aspirin and Plavix for 20 more days.  H&H has been stable so far.  Uncontrolled A-fib -Increase metoprolol dosage to 37.5 mg twice daily -Consider digoxin x1 day, given there is a signs of CHF decompensation  Chronic systolic CHF -LVEF 28-78% commented as borderline low normal function, now has signs of overload, CT chest pending to further evaluate signs of overload. -We will decide further diuresis plan after CT chest.  AKI on CKD stage II -Signs of fluid overload on physical exam -Hold off metformin, and Lasix for now  IDDM -Hold off metformin committal to surgery of kidney function -Continue Lantus 15 units daily, continue sliding scale -Start Januvia -Continue Jardiance  Recent CVA -On aspirin Plavix and Eliquis regimen, continue statin  Severe protein calorie malnutrition -Start Ensure  Iron deficiency anemia -H&H stable, no symptoms or signs of bleeding.  On dual antiplatelet and Eliquis regimen for recent ICA stenting and high risk A-fib.  Deconditioning -PT evaluation  DVT prophylaxis: Eliquis Family Communication: Niece at bedside CODE STATUS: Full code Disposition Plan: Patient is sick with HCAP and uncontrolled A-fib and CHF decompensation and COPD exacerbation, expect more than 2 midnight hospital stay Consults called:  None Admission status: Telemetry admission   Lequita Halt MD [facility] Pager [phone]  01/10/2022, 4:04 PM

## 2022-01-10 NOTE — ED Provider Notes (Signed)
[facility] EMERGENCY DEPARTMENT Provider Note   CSN: [id number] Arrival date & time: 01/10/22  1317     History  Chief Complaint  Patient presents with   Shortness of Breath   Cough    Reginald Ayers is a 71 y.o. male.  71 year old male with prior medical history as detailed below presents for evaluation.  Patient with increased cough for the last 24 hours.  Patient without fever.  Patient with baseline need for 2.5 L of O2 via nasal cannula.  Patient was found by EMS this morning with saturations of approximately 9% on his regular nasal cannula O2.  EMS administered albuterol, Atrovent, and 125 mg of Solu-Medrol.  Patient denies fever.  He denies chest pain.  He reports increased productive cough over the last 24 hours.  Patient reports that he feels somewhat improved after breathing treatment administration with EMS.   The history is provided by the patient, medical records and the EMS personnel.  Cough Cough characteristics:  Productive Sputum characteristics:  Nondescript Severity:  Moderate Onset quality:  Gradual Duration:  1 day Timing:  Rare Progression:  Worsening Chronicity:  New     Home Medications Prior to Admission medications   Medication Sig Start Date End Date Taking? Authorizing Provider  albuterol (VENTOLIN HFA) 108 (90 Base) MCG/ACT inhaler Inhale 2 puffs into the lungs every 4 (four) hours as needed. 01/18/20   [provider]  apixaban (ELIQUIS) 2.5 MG TABS tablet Take 1 tablet (2.5 mg total) by mouth 2 (two) times daily. 12/31/21   Thurnell Lose, MD  aspirin 81 MG EC tablet Take 1 tablet (81 mg total) by mouth daily. Swallow whole. 12/31/21   Thurnell Lose, MD  Blood Glucose Monitoring Suppl (BLOOD GLUCOSE MONITOR SYSTEM) w/Device KIT Used to test blood sugars 4 times daily 12/31/21   Thurnell Lose, MD  clopidogrel (PLAVIX) 75 MG tablet Take 75 mg by mouth daily.  07/24/13   [provider]   Cyanocobalamin (VITAMIN B 12) 500 MCG TABS Take 1,000 mcg by mouth daily.    [provider]  empagliflozin (JARDIANCE) 25 MG TABS tablet Take 25 mg by mouth daily.    [provider]  ferrous sulfate 324 MG TBEC Take 324 mg by mouth daily with breakfast.    [provider]  furosemide (LASIX) 20 MG tablet Take 20 mg by mouth daily.    [provider]  Homeopathic Products (LEG CRAMP RELIEF SL) Place 2 tablets under the tongue at bedtime as needed (leg cramps).    [provider]  insulin aspart (NOVOLOG) 100 UNIT/ML FlexPen Before each meal 3 times a day, 140-199 - 2 units, 200-250 - 4 units, 251-299 - 6 units,  300-349 - 8 units,  350 or above 10 units.  Insulin PEN if approved, provide syringes and needles if needed. 12/31/21   Thurnell Lose, MD  Insulin Glargine (BASAGLAR KWIKPEN) 100 UNIT/ML Inject 15 Units into the skin daily. 12/31/21   Thurnell Lose, MD  Insulin Pen Needle 32G X 4 MM MISC Use to inject insulin 4 times daily. 12/31/21   Thurnell Lose, MD  metFORMIN (GLUCOPHAGE) 500 MG tablet Take 1,000 mg by mouth 2 (two) times daily with a meal.  07/24/13   [provider]  metoprolol succinate (TOPROL-XL) 25 MG 24 hr tablet TAKE 1 TABLET(25 MG) BY MOUTH DAILY Patient taking differently: Take 25 mg by mouth daily. 07/29/20   Lorretta Harp, MD  Multiple Vitamins-Minerals (CENTRUM SILVER 50+MEN) TABS Take 1 tablet by mouth daily.    [provider]  NITROSTAT 0.4 MG SL tablet Take 0.4 mg by mouth every 5 (five) minutes as needed for chest pain.  12/18/14   [provider]  pantoprazole (PROTONIX) 40 MG tablet Take 1 tablet (40 mg total) by mouth daily. 12/31/21   Thurnell Lose, MD  Polyvinyl Alcohol-Povidone (MURINE TEARS FOR DRY EYES OP) Apply 1 drop to eye 3 (three) times daily as needed (dry eyes).    [provider]  potassium chloride (K-DUR) 10 MEQ tablet Take 10 mEq by mouth daily.    [provider]  Pseudoeph-Doxylamine-DM-APAP (NYQUIL PO) Take 1 Dose by mouth at bedtime.     [provider]  rosuvastatin (CRESTOR) 20 MG tablet Take 1 tablet (20 mg total) by mouth daily. 12/31/21   Thurnell Lose, MD  SPIRIVA HANDIHALER 18 MCG inhalation capsule Place 18 mcg into inhaler and inhale daily at 8 pm.  08/16/13   [provider]      Allergies    Actos [pioglitazone], Atorvastatin, and Lisinopril    Review of Systems   Review of Systems  Respiratory:  Positive for cough.   All other systems reviewed and are negative.  Physical Exam Updated Vital Signs BP (!) 121/52   Pulse 100   Temp 98.4 F (36.9 C) (Axillary)   Resp (!) 22   Ht _0  (1.549 m)   Wt 45.8 kg   SpO2 100%   BMI 19.08 kg/m  Physical Exam Vitals and nursing note reviewed.  Constitutional:      General: He is not in acute distress.    Appearance: Normal appearance. He is well-developed.  HENT:     Head: Normocephalic and atraumatic.  Eyes:     Conjunctiva/sclera: Conjunctivae normal.     Pupils: Pupils are equal, round, and reactive to light.  Cardiovascular:     Rate and Rhythm: Normal rate and regular rhythm.     Heart sounds: Normal heart sounds.  Pulmonary:     Effort: Pulmonary effort is normal. No respiratory distress.     Breath sounds: Examination of the right-lower field reveals decreased breath sounds. Decreased breath sounds present.  Abdominal:     General: There is no distension.     Palpations: Abdomen is soft.     Tenderness: There is no abdominal tenderness.  Musculoskeletal:        General: No deformity. Normal range of motion.     Cervical back: Normal range of motion and neck supple.  Skin:    General: Skin is warm and dry.  Neurological:     General: No focal deficit present.     Mental Status: He is alert and oriented to person, place, and time.    ED Results / Procedures / Treatments   Labs (all labs ordered are listed, but only abnormal  results are displayed) Labs Reviewed  CULTURE, BLOOD (ROUTINE X 2)  CULTURE, BLOOD (ROUTINE X 2)  RESP PANEL BY RT-PCR (FLU A&B, COVID) ARPGX2  COMPREHENSIVE METABOLIC PANEL  CBC WITH DIFFERENTIAL/PLATELET  BRAIN NATRIURETIC PEPTIDE  LACTIC ACID, PLASMA  LACTIC ACID, PLASMA  I-STAT VENOUS BLOOD GAS, ED  TROPONIN I (HIGH SENSITIVITY)    EKG EKG Interpretation  Date/Time:  Saturday Jan 10 2022 13:24:36 EDT Ventricular Rate:  103 PR Interval:  162 QRS Duration: 106 QT Interval:  333 QTC Calculation: 436 R Axis:   124 Text Interpretation:  Sinus tachycardia Atrial premature complex Anteroseptal infarct, age indeterminate Confirmed by Dene Gentry [phone]) on 01/10/2022 1:31:36 PM  Radiology No results found.  Procedures Procedures    Medications Ordered in ED Medications - No data to display  ED Course/ Medical Decision Making/ A&P                           Medical Decision Making Amount and/or Complexity of Data Reviewed Labs: ordered. Radiology: ordered.  Risk Prescription drug management. Decision regarding hospitalization.    Medical Screen Complete  This patient presented to the ED with complaint of cough, shortness of breath, hypoxia.  This complaint involves an extensive number of treatment options. The initial differential diagnosis includes, but is not limited to, COPD exacerbation, pneumonia, metabolic abnormality, etc.  This presentation is: Acute, Chronic, Self-Limited, Previously Undiagnosed, Uncertain Prognosis, Complicated, Systemic Symptoms, and Threat to Life/Bodily Function  Patient is presenting with complaint of cough and shortness of breath.  Symptoms began over the last 24 hours.  No fever reported.  Patient was noted to be mildly hypoxic despite baseline use of O2 at 2.5 L nasal cannula.  With breathing treatment and Solu-Medrol patient's symptoms appear improved.  Patient does have possible infiltrates in the right lower  lobe.  Patient with recent complicated admission.  Broad-spectrum antibiotics administered here in the ED out of concern for possible pneumonia.  Patient would benefit from admission for further work-up and treatment.    Co morbidities that complicated the patient's evaluation  History of PAD, CAD status post CABG, hypertension, hyperlipidemia, COPD, status post CVA   Additional history obtained:  Additional history obtained from EMS and Family External records from outside sources obtained and reviewed including prior ED visits and prior Inpatient records.    Lab Tests:  I ordered and personally interpreted labs.  The pertinent results include: CBC, CMP, BNP, troponin, lactic acid, COVID, flu, VBG   Imaging Studies ordered:  I ordered imaging studies including chest x-ray I independently visualized and interpreted obtained imaging which showed possible right lower lobe infiltrate I agree with the radiologist interpretation.   Cardiac Monitoring:  The patient was maintained on a cardiac monitor.  I personally viewed and interpreted the cardiac monitor which showed an underlying rhythm of: NSR   Medicines ordered:  I ordered medication including broad-spectrum antibiotics for possible pneumonia Reevaluation of the patient after these medicines showed that the patient: improved  Problem List / ED Course:  Shortness of breath   Reevaluation:  After the interventions noted above, I reevaluated the patient and found that they have: improved   Disposition:  After consideration of the diagnostic results and the patients response to treatment, I feel that the patent would benefit from admission.          Final Clinical Impression(s) / ED Diagnoses Final diagnoses:  Shortness of breath    Rx / DC Orders ED Discharge Orders     None         Valarie Merino, MD 01/10/22 1524

## 2022-01-10 NOTE — Progress Notes (Signed)
CT chest reviewed.  Unresolved right lower lobe pneumonia versus new lobular PNA, with small left lower lobe pneumonia, highly suspicious for aspiration pneumonia.  Continue current regimen of vancomycin and cefepime, waiting for speech evaluation.  Only a tiny right-sided pleural effusion seen likely associated with pneumonia, probably parapeumonic? D/W PCCM, who will come to see the patient and decide additional recommendations.

## 2022-01-10 NOTE — ED Notes (Signed)
CRITICAL VALUE STICKER  CRITICAL VALUE: lactic 2.0  RECEIVER (on-site recipient of call):Reginald Ayers  DATE & TIME NOTIFIED: 01/10/22 1647  MESSENGER (representative from lab):[place]  MD NOTIFIED: Reginald Ayers  TIME OF NOTIFICATION:1647  RESPONSE:

## 2022-01-10 NOTE — ED Notes (Signed)
Pt to ct 

## 2022-01-10 NOTE — Consult Note (Signed)
NAME:  Reginald Ayers, MRN:  [id number], DOB:  05-08-1951, LOS: 0 ADMISSION DATE:  01/10/2022, CONSULTATION DATE:  01/10/22 REFERRING MD: Carrolyn Leigh, CHIEF COMPLAINT: Cough  History of Present Illness:  71 year old man with history of recent right carotid stent placement, CVA, A-fib presents to the hospital with productive cough and shortness of breath, be hypoxemic with imaging findings consistent with right middle lobe and right lower lobe pneumonia with associated parapneumonic effusion whom we are consulted for evaluation of parapneumonic effusion.  Some started couple days ago.  Worsening.  Chest pain with inspiration, pleuritic on the right.  Productive cough.  Mildly elevated temperature.  Checked O2 and was low despite home oxygen use.  Uses 2.5 L at home.  This prompted presentation to ED.  Chest imaging personally reviewed chest x-ray and CT both with right-sided infiltrate clearly right middle lobe and right lower lobe on CT scan with associated very small parapneumonic effusion.  He was placed on antibiotics.  We were consulted for evaluation of the parapneumonic effusion.  Pertinent  Medical History  Diabetes: Atrial fibrillation,, CVA with recent right carotid stent placement  Significant Hospital Events: Including procedures, antibiotic start and stop dates in addition to other pertinent events   01/10/2022 admitted to the hospital with pneumonia parapneumonic effusion  Interim History / Subjective:  N/a  Objective   Blood pressure (!) 139/56, pulse 99, temperature 98.2 F (36.8 C), temperature source Oral, resp. rate 20, height '5\' 1"'  (1.549 m), weight 46.2 kg, SpO2 97 %.       No intake or output data in the 24 hours ending 01/10/22 1805 Filed Weights   01/10/22 1328 01/10/22 1800  Weight: 45.8 kg 46.2 kg    Examination: General: Frail, sitting in bed Eyes: EOMI, icterus Neck: Supple no JVP Pulmonary: Normal work of breathing, crackles throughout right lower lung  field Cardiovascular tachycardic, regular rhythm Abdomen: Nondistended, bowel sounds present MSK: No synovitis, joint effusion Neuro: Moves all extremities, sensation intact Psych: Normal mood, full affect  Resolved Hospital Problem list   N/a  Assessment & Plan:  Pneumonia with parapneumonic effusion: Clear right lower lobe infiltrate with also right middle lobe airways disease with small parapneumonic effusion.  1 cm or less on my measurements via CT scan.  Too small for intervention. --Antibiotics per primary --Serial chest imaging per primary, if concern for worsening or enlarging pleural effusion please contact pulmonary service for further evaluation  Pulmonary: Care medicine will sign off at this time.  Best Practice (right click and "Reselect all SmartList Selections" daily)   Per primary team  Labs   CBC: Recent Labs  Lab 01/10/22 1348 01/10/22 1416  WBC 14.0*  --   NEUTROABS 10.3*  --   HGB 9.2* 10.5*  HCT 30.4* 31.0*  MCV 96.5  --   PLT 416*  --     Basic Metabolic Panel: Recent Labs  Lab 01/10/22 1348 01/10/22 1416  NA 131* 131*  K 4.2 4.2  CL 98  --   CO2 23  --   GLUCOSE 304*  --   BUN 25*  --   CREATININE 1.79*  --   CALCIUM 9.0  --    GFR: Estimated Creatinine Clearance: 24 mL/min (A) (by C-G formula based on SCr of 1.79 mg/dL (H)). Recent Labs  Lab 01/10/22 1328 01/10/22 1348 01/10/22 1528  PROCALCITON  --   --  5.88  WBC  --  14.0*  --   LATICACIDVEN 1.8  --  2.0*  Liver Function Tests: Recent Labs  Lab 01/10/22 1348  AST 18  ALT 18  ALKPHOS 84  BILITOT 0.5  PROT 6.7  ALBUMIN 2.5*   No results for input(s): LIPASE, AMYLASE in the last 168 hours. No results for input(s): AMMONIA in the last 168 hours.  ABG    Component Value Date/Time   PHART 7.365 12/21/2021 1202   PCO2ART 36.8 12/21/2021 1202   PO2ART 79 (L) 12/21/2021 1202   HCO3 24.9 01/10/2022 1416   TCO2 26 01/10/2022 1416   ACIDBASEDEF 1.0 01/10/2022 1416    O2SAT 98 01/10/2022 1416     Coagulation Profile: No results for input(s): INR, PROTIME in the last 168 hours.  Cardiac Enzymes: No results for input(s): CKTOTAL, CKMB, CKMBINDEX, TROPONINI in the last 168 hours.  HbA1C: Hgb A1c MFr Bld  Date/Time Value Ref Range Status  12/21/2021 10:46 PM 8.1 (H) 4.8 - 5.6 % Final    Comment:    (NOTE) Pre diabetes:          5.7%-6.4%  Diabetes:              >6.4%  Glycemic control for   <7.0% adults with diabetes   09/27/2021 11:08 AM 7.0 (H) 4.8 - 5.6 % Final    Comment:    (NOTE) Pre diabetes:          5.7%-6.4%  Diabetes:              >6.4%  Glycemic control for   <7.0% adults with diabetes     CBG: Recent Labs  Lab 01/10/22 1741  GLUCAP 355*    Review of Systems:   No chest pain with exertion.  No lower extremity swelling.  No orthopnea or PND.  Comprehensive review of systems otherwise negative.  Past Medical History:  He,  has a past medical history of Carotid artery disease ([place]), COPD (chronic obstructive pulmonary disease) ([place]), Coronary artery disease, Diabetes ([place]), Emphysema lung ([place]), Hyperlipidemia, Hypertension, Peripheral arterial disease ([place]), and Tobacco abuse.   Surgical History:   Past Surgical History:  Procedure Laterality Date   ABDOMINAL AORTOGRAM N/A 04/20/2017   Procedure: ABDOMINAL AORTOGRAM;  Surgeon: Serafina Mitchell, MD;  Location: [place] [facility];  Service: Cardiovascular;  Laterality: N/A;   ABDOMINAL AORTOGRAM W/LOWER EXTREMITY Right 12/08/2016   Procedure: Abdominal Aortogram w/Lower Extremity;  Surgeon: Serafina Mitchell, MD;  Location: [place] [facility];  Service: Cardiovascular;  Laterality: Right;   ABDOMINAL AORTOGRAM W/LOWER EXTREMITY N/A 09/07/2017   Procedure: ABDOMINAL AORTOGRAM W/LOWER EXTREMITY;  Surgeon: Serafina Mitchell, MD;  Location: [place] [facility];  Service: Cardiovascular;  Laterality: N/A;   CARDIAC CATHETERIZATION  2010   CAROTID ANGIOGRAM  2014   CORONARY ARTERY  BYPASS GRAFT  2010   LIMA-LAD, Lrad-OM1, SVG-RI, SVG-AM-dRCA   DOPPLER ECHOCARDIOGRAPHY  2010   LOWER EXTREMITY ANGIOGRAM  11/2006   left common femoral endarterectomy and patch angioplasty:The mid right SFA was angioplastied with a 4 x 8   LOWER EXTREMITY ANGIOGRAM  2003   left common iliac artery stenting by Dr. Adora Fridge   LOWER EXTREMITY ANGIOGRAPHY Right 04/20/2017   Procedure: Lower Extremity Angiography;  Surgeon: Serafina Mitchell, MD;  Location: [place] [facility];  Service: Cardiovascular;  Laterality: Right;   NM MYOVIEW LTD  2015   PERIPHERAL VASCULAR ATHERECTOMY Right 12/08/2016   Procedure: Peripheral Vascular Atherectomy;  Surgeon: Serafina Mitchell, MD;  Location: [place] [facility];  Service: Cardiovascular;  Laterality: Right;  PERIPHERAL VASCULAR BALLOON ANGIOPLASTY Right 12/08/2016   Procedure: Peripheral Vascular Balloon Angioplasty;  Surgeon: Serafina Mitchell, MD;  Location: [place] [facility];  Service: Cardiovascular;  Laterality: Right;   PERIPHERAL VASCULAR BALLOON ANGIOPLASTY Right 04/20/2017   Procedure: PERIPHERAL VASCULAR BALLOON ANGIOPLASTY;  Surgeon: Serafina Mitchell, MD;  Location: [place] [facility];  Service: Cardiovascular;  Laterality: Right;  SFA   PERIPHERAL VASCULAR CATHETERIZATION N/A 09/22/2016   Procedure: Abdominal Aortogram w/ bilateral Lower Extremity Runoff;  Surgeon: Serafina Mitchell, MD;  Location: [place] [facility];  Service: Cardiovascular;  Laterality: N/A;   PERIPHERAL VASCULAR CATHETERIZATION Right 09/22/2016   Procedure: Peripheral Vascular Intervention;  Surgeon: Serafina Mitchell, MD;  Location: [place] [facility];  Service: Cardiovascular;  Laterality: Right;   TRANSCAROTID ARTERY REVASCULARIZATION  Right 12/26/2021   Procedure: Right Transcarotid Artery Revascularization;  Surgeon: Serafina Mitchell, MD;  Location: [facility];  Service: Vascular;  Laterality: Right;   ULTRASOUND GUIDANCE FOR VASCULAR ACCESS Left 12/26/2021   Procedure: ULTRASOUND GUIDANCE  FOR VASCULAR ACCESS, LEFT FEMORAL VEIN;  Surgeon: Serafina Mitchell, MD;  Location: [facility] OR;  Service: Vascular;  Laterality: Left;     Social History:   reports that he quit smoking about 5 years ago. His smoking use included cigarettes. His smokeless tobacco use includes chew. He reports that he does not drink alcohol and does not use drugs.   Family History:  His family history includes CVA in his mother; Diabetes in his sister; Heart attack in his mother; Heart disease in his mother; Hypertension in his sister.   Allergies Allergies  Allergen Reactions   Actos [Pioglitazone] Shortness Of Breath    Leg swelling    Atorvastatin     Other reaction(s): leg pain   Lisinopril Cough     Home Medications  Prior to Admission medications   Medication Sig Start Date End Date Taking? Authorizing Provider  albuterol (VENTOLIN HFA) 108 (90 Base) MCG/ACT inhaler Inhale 2 puffs into the lungs every 4 (four) hours as needed. 01/18/20   [provider]  apixaban (ELIQUIS) 2.5 MG TABS tablet Take 1 tablet (2.5 mg total) by mouth 2 (two) times daily. 12/31/21   Thurnell Lose, MD  aspirin 81 MG EC tablet Take 1 tablet (81 mg total) by mouth daily. Swallow whole. 12/31/21   Thurnell Lose, MD  Blood Glucose Monitoring Suppl (BLOOD GLUCOSE MONITOR SYSTEM) w/Device KIT Used to test blood sugars 4 times daily 12/31/21   Thurnell Lose, MD  clopidogrel (PLAVIX) 75 MG tablet Take 75 mg by mouth daily.  07/24/13   [provider]  Cyanocobalamin (VITAMIN B 12) 500 MCG TABS Take 1,000 mcg by mouth daily.    [provider]  empagliflozin (JARDIANCE) 25 MG TABS tablet Take 25 mg by mouth daily.    [provider]  ferrous sulfate 324 MG TBEC Take 324 mg by mouth daily with breakfast.    [provider]  furosemide (LASIX) 20 MG tablet Take 20 mg by mouth daily.    [provider]  Homeopathic Products (LEG CRAMP RELIEF SL) Place 2 tablets under the tongue  at bedtime as needed (leg cramps).    [provider]  insulin aspart (NOVOLOG) 100 UNIT/ML FlexPen Before each meal 3 times a day, 140-199 - 2 units, 200-250 - 4 units, 251-299 - 6 units,  300-349 - 8 units,  350 or above 10 units.  Insulin PEN if approved, provide syringes and needles  if needed. 12/31/21   Thurnell Lose, MD  Insulin Glargine (BASAGLAR KWIKPEN) 100 UNIT/ML Inject 15 Units into the skin daily. 12/31/21   Thurnell Lose, MD  Insulin Pen Needle 32G X 4 MM MISC Use to inject insulin 4 times daily. 12/31/21   Thurnell Lose, MD  metFORMIN (GLUCOPHAGE) 500 MG tablet Take 1,000 mg by mouth 2 (two) times daily with a meal.  07/24/13   [provider]  metoprolol succinate (TOPROL-XL) 25 MG 24 hr tablet TAKE 1 TABLET(25 MG) BY MOUTH DAILY Patient taking differently: Take 25 mg by mouth daily. 07/29/20   Lorretta Harp, MD  Multiple Vitamins-Minerals (CENTRUM SILVER 50+MEN) TABS Take 1 tablet by mouth daily.    [provider]  NITROSTAT 0.4 MG SL tablet Take 0.4 mg by mouth every 5 (five) minutes as needed for chest pain.  12/18/14   [provider]  pantoprazole (PROTONIX) 40 MG tablet Take 1 tablet (40 mg total) by mouth daily. 12/31/21   Thurnell Lose, MD  Polyvinyl Alcohol-Povidone (MURINE TEARS FOR DRY EYES OP) Apply 1 drop to eye 3 (three) times daily as needed (dry eyes).    [provider]  potassium chloride (K-DUR) 10 MEQ tablet Take 10 mEq by mouth daily.    [provider]  Pseudoeph-Doxylamine-DM-APAP (NYQUIL PO) Take 1 Dose by mouth at bedtime.     [provider]  rosuvastatin (CRESTOR) 20 MG tablet Take 1 tablet (20 mg total) by mouth daily. 12/31/21   Thurnell Lose, MD  SPIRIVA HANDIHALER 18 MCG inhalation capsule Place 18 mcg into inhaler and inhale daily at 8 pm.  08/16/13   [provider]     Critical care time: n/a     Lanier Clam, MD See Shea Evans for contact info

## 2022-01-11 DIAGNOSIS — J189 Pneumonia, unspecified organism: Secondary | ICD-10-CM | POA: Diagnosis not present

## 2022-01-11 LAB — BASIC METABOLIC PANEL
Anion gap: 10 (ref 5–15)
BUN: 26 mg/dL — ABNORMAL HIGH (ref 8–23)
CO2: 23 mmol/L (ref 22–32)
Calcium: 9.2 mg/dL (ref 8.9–10.3)
Chloride: 101 mmol/L (ref 98–111)
Creatinine, Ser: 1.55 mg/dL — ABNORMAL HIGH (ref 0.61–1.24)
GFR, Estimated: 47 mL/min — ABNORMAL LOW (ref >60)
Glucose, Bld: 207 mg/dL — ABNORMAL HIGH (ref 70–99)
Potassium: 3.9 mmol/L (ref 3.5–5.1)
Sodium: 134 mmol/L — ABNORMAL LOW (ref 135–145)

## 2022-01-11 LAB — GLUCOSE, CAPILLARY
Glucose-Capillary: 217 mg/dL — ABNORMAL HIGH (ref 70–99)
Glucose-Capillary: 325 mg/dL — ABNORMAL HIGH (ref 70–99)
Glucose-Capillary: 381 mg/dL — ABNORMAL HIGH (ref 70–99)
Glucose-Capillary: 288 mg/dL — ABNORMAL HIGH (ref 70–99)

## 2022-01-11 LAB — CBC
HCT: 27.9 % — ABNORMAL LOW (ref 39.0–52.0)
Hemoglobin: 8.9 g/dL — ABNORMAL LOW (ref 13.0–17.0)
MCH: 29.6 pg (ref 26.0–34.0)
MCHC: 31.9 g/dL (ref 30.0–36.0)
MCV: 92.7 fL (ref 80.0–100.0)
Platelets: 383 10*3/uL (ref 150–400)
RBC: 3.01 MIL/uL — ABNORMAL LOW (ref 4.22–5.81)
RDW: 13.7 % (ref 11.5–15.5)
WBC: 14 10*3/uL — ABNORMAL HIGH (ref 4.0–10.5)
nRBC: 0 % (ref 0.0–0.2)

## 2022-01-11 LAB — PROCALCITONIN: Procalcitonin: 8.54 ng/mL

## 2022-01-11 MED ORDER — GLUCERNA SHAKE PO LIQD
237.0000 mL | Freq: Three times a day (TID) | ORAL | Status: DC
Start: 2022-01-11 — End: 2022-01-12
  Administered 2022-01-12: 237 mL via ORAL

## 2022-01-11 MED ORDER — OXYCODONE HCL 5 MG PO TABS
5.0000 mg | ORAL_TABLET | Freq: Four times a day (QID) | ORAL | Status: DC | PRN
Start: 2022-01-11 — End: 2022-01-12
  Administered 2022-01-11 – 2022-01-12 (×2): 5 mg via ORAL
  Filled 2022-01-11 (×2): qty 1

## 2022-01-11 MED ORDER — INSULIN GLARGINE-YFGN 100 UNIT/ML ~~LOC~~ SOLN
15.0000 [IU] | Freq: Every day | SUBCUTANEOUS | Status: DC
  Administered 2022-01-11 – 2022-01-12 (×2): 15 [IU] via SUBCUTANEOUS
  Filled 2022-01-11 (×2): qty 0.15

## 2022-01-11 MED ORDER — IPRATROPIUM-ALBUTEROL 0.5-2.5 (3) MG/3ML IN SOLN
3.0000 mL | Freq: Four times a day (QID) | RESPIRATORY_TRACT | Status: DC | PRN
  Administered 2022-01-11: 3 mL via RESPIRATORY_TRACT
  Filled 2022-01-11: qty 3

## 2022-01-11 MED ORDER — GUAIFENESIN-DM 100-10 MG/5ML PO SYRP
5.0000 mL | ORAL_SOLUTION | ORAL | Status: DC | PRN
  Filled 2022-01-11: qty 5

## 2022-01-11 NOTE — Evaluation (Signed)
Clinical/Bedside Swallow Evaluation Patient Details  Name: Bertram GalaWilliam H Dusek MRN: [id number] Date of Birth: 05/28/1951  Today's Date: 01/11/2022 Time: SLP Start Time (ACUTE ONLY): 1450 SLP Stop Time (ACUTE ONLY): 1504 SLP Time Calculation (min) (ACUTE ONLY): 14 min  Past Medical History:  Past Medical History:  Diagnosis Date   Carotid artery disease (HCC)    L-ICA 100%, mod R-ICA dz   COPD (chronic obstructive pulmonary disease) (HCC)    Coronary artery disease    s/p CABG February 2010 by Dr. Andrey SpearmanSteve Hendrickson   Diabetes [facility](HCC)    Emphysema lung (HCC)    Hyperlipidemia    Hypertension    Peripheral arterial disease (HCC)    post left common iliac and right SFA stenting remotely   Tobacco abuse        Past Surgical History:  Past Surgical History:  Procedure Laterality Date   ABDOMINAL AORTOGRAM N/A 04/20/2017   Procedure: ABDOMINAL AORTOGRAM;  Surgeon: Nada LibmanBrabham, Vance W, MD;  Location: [facility];  Service: Cardiovascular;  Laterality: N/A;   ABDOMINAL AORTOGRAM W/LOWER EXTREMITY Right 12/08/2016   Procedure: Abdominal Aortogram w/Lower Extremity;  Surgeon: Nada LibmanVance W Brabham, MD;  Location: [facility];  Service: Cardiovascular;  Laterality: Right;   ABDOMINAL AORTOGRAM W/LOWER EXTREMITY N/A 09/07/2017   Procedure: ABDOMINAL AORTOGRAM W/LOWER EXTREMITY;  Surgeon: Nada LibmanBrabham, Vance W, MD;  Location: [facility];  Service: Cardiovascular;  Laterality: N/A;   CARDIAC CATHETERIZATION  2010   CAROTID ANGIOGRAM  2014   CORONARY ARTERY BYPASS GRAFT  2010   LIMA-LAD, Lrad-OM1, SVG-RI, SVG-AM-dRCA   DOPPLER ECHOCARDIOGRAPHY  2010   LOWER EXTREMITY ANGIOGRAM  11/2006   left common femoral endarterectomy and patch angioplasty:The mid right SFA was angioplastied with a 4 x 8   LOWER EXTREMITY ANGIOGRAM  2003   left common iliac artery stenting by Dr. Erlene QuanJ. Berry   LOWER EXTREMITY ANGIOGRAPHY Right 04/20/2017   Procedure: Lower Extremity Angiography;  Surgeon: Nada LibmanBrabham, Vance W,  MD;  [facility];  Service: Cardiovascular;  Laterality: Right;   NM MYOVIEW LTD  2015   PERIPHERAL VASCULAR ATHERECTOMY Right 12/08/2016   Procedure: Peripheral Vascular Atherectomy;  Surgeon: Nada LibmanVance W Brabham, MD;  Location: [facility];  Service: Cardiovascular;  Laterality: Right;   PERIPHERAL VASCULAR BALLOON ANGIOPLASTY Right 12/08/2016   Procedure: Peripheral Vascular Balloon Angioplasty;  Surgeon: Nada LibmanVance W Brabham, MD;  Location: [facility];  Service: Cardiovascular;  Laterality: Right;   PERIPHERAL VASCULAR BALLOON ANGIOPLASTY Right 04/20/2017   Procedure: PERIPHERAL VASCULAR BALLOON ANGIOPLASTY;  Surgeon: Nada LibmanBrabham, Vance W, MD;  Location: [facility];  Service: Cardiovascular;  Laterality: Right;  SFA   PERIPHERAL VASCULAR CATHETERIZATION N/A 09/22/2016   Procedure: Abdominal Aortogram w/ bilateral Lower Extremity Runoff;  Surgeon: Nada LibmanVance W Brabham, MD;  Location: [facility];  Service: Cardiovascular;  Laterality: N/A;   PERIPHERAL VASCULAR CATHETERIZATION Right 09/22/2016   Procedure: Peripheral Vascular Intervention;  Surgeon: Nada LibmanVance W Brabham, MD;  Location: [facility];  Service: Cardiovascular;  Laterality: Right;   TRANSCAROTID ARTERY REVASCULARIZATION  Right 12/26/2021   Procedure: Right Transcarotid Artery Revascularization;  Surgeon: Nada LibmanBrabham, Vance W, MD;  [facility];  Service: Vascular;  Laterality: Right;   ULTRASOUND GUIDANCE FOR VASCULAR ACCESS Left 12/26/2021   Procedure: ULTRASOUND GUIDANCE FOR VASCULAR ACCESS, LEFT FEMORAL VEIN;  Surgeon: Nada LibmanBrabham, Vance W, MD;  Location: [facility] OR;  Service: Vascular;  Laterality: Left;   HPI:  71 year old man with history  of recent right carotid stent placement, dysphagia, CVA, A-fib presents to the hospital with productive cough and shortness of breath, be hypoxemic with imaging findings consistent with right middle lobe and right lower lobe pneumonia with associated parapneumonic effusion.  Recent hospitalization for CVA with MBS 5/8  > flash penetration elicited cough, stasis in esophagus (possible account for cough), no dentition and needs soft foods > Dys 2/thin rec'd. Follow up revealed regurgitation during meals. Disussed with dtr and diet downgraded to puree. PMH: COPD, DM, CAD.    Assessment / Plan / Recommendation  Clinical Impression  Pt demonstrates possible laryngeal penetration and/or aspiration with consistent cough after thin liquids from cup and straw. He also has a cough and history of COPD at baseline. He manipulated applesauce as well as solid texture (without dentition- no dentures) timely without incident. Appreciated normal oral-motor exam and strong congested volitional cough. Pt may have a functional swallow however given second hospitalization for pna (worsening), COPD a repeat MBS is recommended. Continue regular/thin, pills crushed or whole in applesauce. Of note during MBS 2 weeks ago he had mild stasis in esophagus cleared with liquid wash which may account for consistent cough. SLP Visit Diagnosis: Dysphagia, unspecified (R13.10)    Aspiration Risk  Mild aspiration risk    Diet Recommendation Regular;Thin liquid   Liquid Administration via: Cup;Straw Medication Administration: Crushed with puree (RN has been crushing) Supervision: Patient able to self feed;Intermittent supervision to cue for compensatory strategies Compensations: Slow rate;Small sips/bites;Minimize environmental distractions Postural Changes: Seated upright at 90 degrees;Remain upright for at least 30 minutes after po intake    Other  Recommendations Oral Care Recommendations: Oral care BID    Recommendations for follow up therapy are one component of a multi-disciplinary discharge planning process, led by the attending physician.  Recommendations may be updated based on patient status, additional functional criteria and insurance authorization.  Follow up Recommendations  (TBD)       Assistance Recommended at Discharge  (TBD)  Functional Status Assessment Patient has had a recent decline in their functional status and demonstrates the ability to make significant improvements in function in a reasonable and predictable amount of time.  Frequency and Duration min 2x/week  2 weeks       Prognosis Prognosis for Safe Diet Advancement: Good      Swallow Study   General Date of Onset: 01/10/22 HPI: 71 year old man with history of recent right carotid stent placement, dysphagia, CVA, A-fib presents to the hospital with productive cough and shortness of breath, be hypoxemic with imaging findings consistent with right middle lobe and right lower lobe pneumonia with associated parapneumonic effusion. Recent hospitalization for CVA with MBS 5/8  > flash penetration elicited cough, stasis in esophagus (possible account for cough), no dentition and needs soft foods > Dys 2/thin rec'd. Follow up revealed regurgitation during meals. Disussed with dtr and diet downgraded to puree. PMH: COPD, DM, CAD. Type of Study: Bedside Swallow Evaluation Previous Swallow Assessment:  (see HPI) Diet Prior to this Study: Regular;Thin liquids Temperature Spikes Noted: No Respiratory Status: Nasal cannula History of Recent Intubation: No Behavior/Cognition: Alert;Pleasant mood;Cooperative (deaf in right ear) Oral Cavity Assessment: Within Functional Limits Oral Care Completed by SLP: No Oral Cavity - Dentition: Edentulous Vision: Functional for self-feeding Self-Feeding Abilities: Able to feed self Patient Positioning: Upright in bed Baseline Vocal Quality: Normal Volitional Cough: Congested Volitional Swallow: Able to elicit    Oral/Motor/Sensory Function Overall Oral Motor/Sensory Function: Within functional limits   Ice Chips Ice  chips: Not tested   Thin Liquid Thin Liquid: Impaired Presentation: Cup;Straw Oral Phase Impairments:  (none) Oral Phase Functional Implications:   (none) Pharyngeal  Phase Impairments: Cough - Immediate    Nectar Thick Nectar Thick Liquid: Not tested   Honey Thick Honey Thick Liquid: Not tested   Puree Puree: Within functional limits   Solid     Solid: Within functional limits      Royce Macadamia 01/11/2022,3:25 PM

## 2022-01-11 NOTE — Evaluation (Signed)
Occupational Therapy Evaluation Patient Details Name: Reginald Ayers MRN: [id number] DOB: [birth year] Today's Date: 01/11/2022   History of Present Illness 71 y.o. male [facility]22 with worsening coughg and hypoxia. Chest x-ray demonstrates persistent right mid and lower lobe infiltrates consistent for unresolved pneumonia. Pt recently admitted in May with CVA s/p L ICA stenting. PMH includes PAF on Eliquis, PAD, chronic hypoxic respiratory failure , PVD, COPD, HTN, HLD, IDDM, CHF, CKDII.   Clinical Impression   Pt was evaluated s/p the above admission list. He requires some assist from his niece at baseline who is able to provide 24/7 support at d/c. Pt required min A for bed mobility, transfers and short ambulation with RW. He is limited by decreased activity tolerance, impaired cognition, unsteady balance, poor bilat shoulder ROM and generalized weakness. Due to the deficits listed, he requires up to mod A for ADLs, and was incontinent with BM this session. OT to continue to follow. Recommend home with HHOT and 24/7 assist from family.      Recommendations for follow up therapy are one component of a multi-disciplinary discharge planning process, led by the attending physician.  Recommendations may be updated based on patient status, additional functional criteria and insurance authorization.   Follow Up Recommendations  Home health OT    Assistance Recommended at Discharge Frequent or constant Supervision/Assistance  Patient can return home with the following A little help with walking and/or transfers;A little help with bathing/dressing/bathroom;Assistance with cooking/housework;Direct supervision/assist for financial management;Direct supervision/assist for medications management;Assist for transportation;Help with stairs or ramp for entrance    Functional Status Assessment  Patient has had a recent decline in their functional status and demonstrates the ability to  make significant improvements in function in a reasonable and predictable amount of time.  Equipment Recommendations  Tub/shower bench    Recommendations for Other Services       Precautions / Restrictions Precautions Precautions: Fall;Other (comment) Precaution Comments: watch O2 Restrictions Weight Bearing Restrictions: No      Mobility Bed Mobility Overal bed mobility: Needs Assistance Bed Mobility: Supine to Sit, Sit to Supine     Supine to sit: Min assist Sit to supine: Min assist        Transfers Overall transfer level: Needs assistance Equipment used: Rolling walker (2 wheels) Transfers: Sit to/from Stand Sit to Stand: Min assist           General transfer comment: poor management of RW and hand placement      Balance Overall balance assessment: Needs assistance Sitting-balance support: Feet supported Sitting balance-Leahy Scale: Fair     Standing balance support: Single extremity supported, During functional activity Standing balance-Leahy Scale: Fair Standing balance comment: able to statically stand wtih 1 UE supported externally to assist wtih rear hygiene                           ADL either performed or assessed with clinical judgement   ADL Overall ADL's : Needs assistance/impaired Eating/Feeding: Supervision/ safety;Sitting   Grooming: Set up;Sitting   Upper Body Bathing: Supervision/ safety;Sitting   Lower Body Bathing: Moderate assistance;Sit to/from stand   Upper Body Dressing : Supervision/safety;Sitting   Lower Body Dressing: Moderate assistance;Sit to/from stand   Toilet Transfer: Minimal assistance;Ambulation;Rolling walker (2 wheels)   Toileting- Clothing Manipulation and Hygiene: Moderate assistance;Sit to/from stand Toileting - Clothing Manipulation Details (indicate cue type and reason): incontinent BM, assist to clean at the bedside this  date     Functional mobility during ADLs: Minimal assistance;Rolling  walker (2 wheels) General ADL Comments: decr activity tolerance, incontinent BM     Vision Baseline Vision/History: 1 Wears glasses Ability to See in Adequate Light: 0 Adequate Vision Assessment?: No apparent visual deficits     Perception     Praxis      Pertinent Vitals/Pain Pain Assessment Pain Assessment: No/denies pain Pain Intervention(s): Limited activity within patient's tolerance, Monitored during session     Hand Dominance Right   Extremity/Trunk Assessment Upper Extremity Assessment Upper Extremity Assessment: Generalized weakness;RUE deficits/detail;LUE deficits/detail RUE Deficits / Details: Limited shoulder ROM, able to touch back of head LUE Deficits / Details: minimal shoulder ROM dur to prior rotator cuff injury. elbow wrist and hand are WFL LUE Coordination: decreased gross motor   Lower Extremity Assessment Lower Extremity Assessment: Generalized weakness   Cervical / Trunk Assessment Cervical / Trunk Assessment: Kyphotic   Communication Communication Communication: HOH   Cognition Arousal/Alertness: Awake/alert Behavior During Therapy: WFL for tasks assessed/performed Overall Cognitive Status: Impaired/Different from baseline Area of Impairment: Safety/judgement, Awareness, Problem solving                         Safety/Judgement: Decreased awareness of safety Awareness: Intellectual Problem Solving: Slow processing, Decreased initiation, Requires verbal cues General Comments: Requires cues for sequencing and safety     General Comments  VSS on 2L [place], niece present and supportive    Exercises     Shoulder Instructions      Home Living Family/patient expects to be discharged to:: Private residence Living Arrangements: Other relatives (niece) Available Help at Discharge: Friend(s);Available PRN/intermittently Type of Home: House Home Access: Level entry     Home Layout: One level     Bathroom Shower/Tub: Tub/shower  unit;Sponge bathes at baseline   Bathroom Toilet: Standard Bathroom Accessibility: Yes   Home Equipment: Agricultural consultant (2 wheels);BSC/3in1;Cane - single point;Adaptive equipment          Prior Functioning/Environment Prior Level of Function : Independent/Modified Independent             Mobility Comments: RW for ambulation recently ADLs Comments: sponge bathes, niece assists with LB ADLs        OT Problem List: Decreased strength;Decreased range of motion;Impaired balance (sitting and/or standing);Decreased activity tolerance;Decreased safety awareness;Decreased knowledge of use of DME or AE;Decreased knowledge of precautions      OT Treatment/Interventions: Self-care/ADL training;Therapeutic exercise;DME and/or AE instruction;Energy conservation;Therapeutic activities;Patient/family education;Balance training    OT Goals(Current goals can be found in the care plan section) Acute Rehab OT Goals Patient Stated Goal: home OT Goal Formulation: With patient Time For Goal Achievement: 01/25/22 Potential to Achieve Goals: Good ADL Goals Pt Will Perform Grooming: with modified independence;standing Pt Will Perform Upper Body Dressing: with modified independence;sitting Pt Will Perform Lower Body Dressing: with modified independence;sit to/from stand Pt Will Transfer to Toilet: with modified independence;ambulating Pt/caregiver will Perform Home Exercise Program: Increased ROM;Increased strength;Both right and left upper extremity;With written HEP provided Additional ADL Goal #1: Pt will demonstrate increased activity tolerance to complete at least 3 ADLs in standing wtih supervision A  OT Frequency: Min 2X/week    Co-evaluation              AM-PAC OT "6 Clicks" Daily Activity     Outcome Measure Help from another person eating meals?: A Little Help from another person taking care of personal grooming?: A Little Help from another  person toileting, which includes using  toliet, bedpan, or urinal?: A Lot Help from another person bathing (including washing, rinsing, drying)?: A Lot Help from another person to put on and taking off regular upper body clothing?: A Little Help from another person to put on and taking off regular lower body clothing?: A Lot 6 Click Score: 15   End of Session Equipment Utilized During Treatment: Rolling walker (2 wheels);Oxygen Nurse Communication: Mobility status  Activity Tolerance: Patient tolerated treatment well Patient left: in bed;with call bell/phone within reach (with SLP)  OT Visit Diagnosis: Other abnormalities of gait and mobility (R26.89);Muscle weakness (generalized) (M62.81)                Time: 5027-7412 OT Time Calculation (min): 17 min Charges:  OT General Charges $OT Visit: 1 Visit OT Evaluation $OT Eval Moderate Complexity: 1 Mod   Yexalen Deike A Tishawna Larouche 01/11/2022, 3:41 PM

## 2022-01-11 NOTE — Progress Notes (Signed)
PT Cancellation Note  Patient Details Name: Reginald Ayers MRN: [id number] DOB: Feb 02, 1951   Cancelled Treatment:    Reason Eval/Treat Not Completed: Patient declined, no reason specified. Pt refusing to mobilize until his family can bring his shoes. He reports the floor is too slick even with the hospital socks on and he will fall if he does not have his shoes. Pt requests PT to return tomorrow morning after shoes arrive.   Arlyss Gandy 01/11/2022, 3:57 PM

## 2022-01-11 NOTE — Progress Notes (Addendum)
PROGRESS NOTE    NYSHAUN Ayers  [id number] DOB: 1951-07-22 DOA: 01/10/2022 PCP: Seward Carol, MD   Brief Narrative:  HPI: Reginald Ayers is a 71 y.o. male with medical history significant of recent right-sided lung pneumonia, recent CVA status post left ICA stenting on aspirin Plavix, PAF on Eliquis, PAD, chronic hypoxic respiratory failure on baseline 2.5 L home O2, PVD, COPD, HTN, HLD, IDDM, chronic borderline systolic CHF, severe protein calorie malnutrition, chronic iron deficiency anemia, CKD stage II, came with worsening of productive cough and hypoxia.   Symptoms started 2 to 3 days ago and currently getting worse, increasing cough with thick yellowish sputum production, and right-sided pleuritic chest pain.  Subjective fever started last night 99 F.  Denies any urinary symptoms, no diarrhea.  Family found patient O2 saturation dropped to mid 80s despite on 2.5 L oxygen this morning and decided to bring patient to ED.   ED Course: Borderline tachycardia, no hypotension, O2 saturation 95% on 2.5 L.  Chest x-ray showed persistent right mid and lower lobe infiltrates consistent for unresolved pneumonia.   WBC 14, creatinine 1.79 compared to baseline 1.5, K4.3, bicarb 23, ABG 7.3 2/47/113.   Patient was given vancomycin and cefepime in the ED.  Assessment & Plan:   Principal Problem:   HCAP (healthcare-associated pneumonia) Active Problems:   COPD (chronic obstructive pulmonary disease) (HCC)   Severe protein-energy malnutrition (HCC)  Acute on chronic hypoxic respiratory failure secondary to HCAP of right middle and lower lobes: Patient was recently discharged from hospital on 12/31/2021 with 2 to 3 L of oxygen.  CT chest without contrast this admission shows groundglass opacities and consolidation, moderate to severe in the right lower lobe and mild in the remaining portion of the lungs.  MRSA screening positive.  We will continue cefepime and vancomycin.  Patient was assessed by  SLP last hospitalization and dysphagia 2 diet was recommended.  SLP has been reconsulted today.  Continue incentive spirometry, flutter valve.  Patient was assessed by PCCM for parapneumonic effusion and per PCCM note, this was very small and they recommended continuing antibiotics for pneumonia.  Currently on 2 L of oxygen which is his baseline.  Acute COPD exacerbation: Patient has no wheezes during my examination.  Continue prednisone, bronchodilators.   Uncontrolled paroxysmal A-fib: Per admitting hospitalist, patient had A-fib with RVR at the time of admission however EKG shows sinus tachycardia.  Currently he is in sinus rhythm 2.  His metoprolol was increased to 37.5 mg twice daily.  Heart rate controlled.  Continue Eliquis.  Recent left temporal stroke and left ICA stenosis status post left ICA stenting -As per discharge summary on May 10, patient will need a total of 30 days dual antiplatelet regimen of aspirin and Plavix.  We will continue aspirin and Plavix for 20 more days.  H&H has been stable so far.   Chronic systolic CHF -LVEF [id number] commented as borderline low normal function, appears euvolemic.  Continue home dose of Lasix.   AKI on CKD stage II has been documented in the H&P, please note that is incorrect.  Patient appears to be having CKD stage IIIa which is at his baseline.   IDDM type II: Hold metformin.  Resumed Lantus 15 units along with Jardiance and Januvia started here.  Blood sugar controlled.  Continue SSI.   Severe protein calorie malnutrition -Start Ensure   Iron deficiency anemia -H&H stable, no symptoms or signs of bleeding.  On dual antiplatelet and Eliquis regimen for  recent ICA stenting and high risk A-fib.   Deconditioning: PT OT consulted.  Hyperlipidemia: Continue Crestor.  GERD: Continue PPI.  PS: During recent hospitalization when he had a stroke, patient was assessed by PT OT who recommended SNF.  Patient had declined SNF and ended up going home  with home health services.  He was deemed a very high risk candidate for readmission since he was not following the recommendations and apparently was very weak.  Addendum: I just received the report from pharmacy that according to niece, patient has not been given the following medications and since surgery on 12/26/2021: clopidogrel, Jardiance, furosemide, metformin, metoprolol   DVT prophylaxis: apixaban (ELIQUIS) tablet 2.5 mg Start: 01/10/22 2200   Code Status: Full Code  Family Communication:  None present at bedside.  Plan of care discussed with patient in length and he/she verbalized understanding and agreed with it.  No request was made to call family.  Status is: Inpatient Remains inpatient appropriate because: Still not back to baseline.  Needs IV antibiotics.   Estimated body mass index is 19.29 kg/m as calculated from the following:   Height as of this encounter: 5\' 1"  (1.549 m).   Weight as of this encounter: 46.3 kg.  Pressure Injury 12/20/21 Buttocks Right;Left Stage 1 -  Intact skin with non-blanchable redness of a localized area usually over a bony prominence. wound is 0.5 cm lenght  by 0.6 cm width (Active)  12/20/21 1831  Location: Buttocks  Location Orientation: Right;Left  Staging: Stage 1 -  Intact skin with non-blanchable redness of a localized area usually over a bony prominence.  Wound Description (Comments): wound is 0.5 cm lenght  by 0.6 cm width  Present on Admission: Yes  Dressing Type Foam - Lift dressing to assess site every shift 01/11/22 0850   Nutritional Assessment: Body mass index is 19.29 kg/m.Marland Kitchen Seen by dietician.  I agree with the assessment and plan as outlined below: Nutrition Status:        . Skin Assessment: I have examined the patient's skin and I agree with the wound assessment as performed by the wound care RN as outlined below: Pressure Injury 12/20/21 Buttocks Right;Left Stage 1 -  Intact skin with non-blanchable redness of a  localized area usually over a bony prominence. wound is 0.5 cm lenght  by 0.6 cm width (Active)  12/20/21 1831  Location: Buttocks  Location Orientation: Right;Left  Staging: Stage 1 -  Intact skin with non-blanchable redness of a localized area usually over a bony prominence.  Wound Description (Comments): wound is 0.5 cm lenght  by 0.6 cm width  Present on Admission: Yes  Dressing Type Foam - Lift dressing to assess site every shift 01/11/22 0850    Consultants:  PCCM  Procedures:  None  Antimicrobials:  Anti-infectives (From admission, onward)    Start     Dose/Rate Route Frequency Ordered Stop   01/12/22 1400  vancomycin (VANCOREADY) IVPB 750 mg/150 mL        750 mg 150 mL/hr over 60 Minutes Intravenous Every 48 hours 01/10/22 1653     01/11/22 1400  ceFEPIme (MAXIPIME) 2 g in sodium chloride 0.9 % 100 mL IVPB        2 g 200 mL/hr over 30 Minutes Intravenous Every 24 hours 01/10/22 1653     01/10/22 1415  ceFEPIme (MAXIPIME) 2 g in sodium chloride 0.9 % 100 mL IVPB        2 g 200 mL/hr over 30 Minutes Intravenous  Once 01/10/22 1408 01/10/22 1456   01/10/22 1415  vancomycin (VANCOCIN) IVPB 1000 mg/200 mL premix        1,000 mg 200 mL/hr over 60 Minutes Intravenous  Once 01/10/22 1408 01/10/22 1535         Subjective: Patient seen and examined.  He states that he feels better.  He has no other complaint.  I further discussed with him if he would be interested in going to SNF this time as he had declined last time.  His onset was " no, I will go home when I will be ready, if I have to die, I prefer to die at my home with 6 other dogs and all 7 of [place] will go to heaven together.  Let me tell you doc, with how the world is going now and where we are heading, I am sure Jesus will come down very soon and when he comes down, I will go with him".   Objective: Vitals:   01/11/22 0040 01/11/22 0310 01/11/22 0451 01/11/22 0745  BP: (!) 105/53  129/62 121/63  Pulse: 83  92 93   Resp: 20  (!) 21 20  Temp: 98 F (36.7 C)  97.7 F (36.5 C) 97.7 F (36.5 C)  TempSrc: Oral  Oral Oral  SpO2: 98% 99% 95% 100%  Weight: 46.3 kg     Height:        Intake/Output Summary (Last 24 hours) at 01/11/2022 1149 Last data filed at 01/11/2022 1104 Gross per 24 hour  Intake 200 ml  Output 1200 ml  Net -1000 ml   Filed Weights   01/10/22 1328 01/10/22 1800 01/11/22 0040  Weight: 45.8 kg 46.2 kg 46.3 kg    Examination:  General exam: Appears calm and comfortable  Respiratory system: Scattered rhonchi bilaterally. Respiratory effort normal. Cardiovascular system: S1 & S2 heard, RRR. No JVD, murmurs, rubs, gallops or clicks. No pedal edema. Gastrointestinal system: Abdomen is nondistended, soft and nontender. No organomegaly or masses felt. Normal bowel sounds heard. Central nervous system: Alert and oriented. No focal neurological deficits. Extremities: Symmetric 5 x 5 power. Skin: No rashes, lesions or ulcers Psychiatry: Judgement and insight appear poor   Data Reviewed: I have personally reviewed following labs and imaging studies  CBC: Recent Labs  Lab 01/10/22 1348 01/10/22 1416 01/11/22 0258  WBC 14.0*  --  14.0*  NEUTROABS 10.3*  --   --   HGB 9.2* 10.5* 8.9*  HCT 30.4* 31.0* 27.9*  MCV 96.5  --  92.7  PLT 416*  --  [id number]   Basic Metabolic Panel: Recent Labs  Lab 01/10/22 1348 01/10/22 1416 01/11/22 0258  NA 131* 131* 134*  K 4.2 4.2 3.9  CL 98  --  101  CO2 23  --  23  GLUCOSE 304*  --  207*  BUN 25*  --  26*  CREATININE 1.79*  --  1.55*  CALCIUM 9.0  --  9.2   GFR: Estimated Creatinine Clearance: 27.8 mL/min (A) (by C-G formula based on SCr of 1.55 mg/dL (H)). Liver Function Tests: Recent Labs  Lab 01/10/22 1348  AST 18  ALT 18  ALKPHOS 84  BILITOT 0.5  PROT 6.7  ALBUMIN 2.5*   No results for input(s): LIPASE, AMYLASE in the last 168 hours. No results for input(s): AMMONIA in the last 168 hours. Coagulation Profile: No results  for input(s): INR, PROTIME in the last 168 hours. Cardiac Enzymes: No results for input(s): CKTOTAL, CKMB, CKMBINDEX, TROPONINI  in the last 168 hours. BNP (last 3 results) No results for input(s): PROBNP in the last 8760 hours. HbA1C: No results for input(s): HGBA1C in the last 72 hours. CBG: Recent Labs  Lab 01/10/22 1741 01/10/22 2110 01/11/22 0512 01/11/22 1100  GLUCAP 355* 253* 217* 325*   Lipid Profile: No results for input(s): CHOL, HDL, LDLCALC, TRIG, CHOLHDL, LDLDIRECT in the last 72 hours. Thyroid Function Tests: No results for input(s): TSH, T4TOTAL, FREET4, T3FREE, THYROIDAB in the last 72 hours. Anemia Panel: No results for input(s): VITAMINB12, FOLATE, FERRITIN, TIBC, IRON, RETICCTPCT in the last 72 hours. Sepsis Labs: Recent Labs  Lab 01/10/22 1328 01/10/22 1528 01/10/22 1854 01/11/22 0258  PROCALCITON  --  5.88  --  8.54  LATICACIDVEN 1.8 2.0* 1.6  --     Recent Results (from the past 240 hour(s))  Culture, blood (routine x 2)     Status: None (Preliminary result)   Collection Time: 01/10/22  1:28 PM   Specimen: BLOOD  Result Value Ref Range Status   Specimen Description BLOOD LEFT ANTECUBITAL  Final   Special Requests   Final    BOTTLES DRAWN AEROBIC AND ANAEROBIC Blood Culture adequate volume   Culture   Final    NO GROWTH < 24 HOURS Performed at [facility], [place] [address] [place], [place] [address]    Report Status PENDING  Incomplete  Resp Panel by RT-PCR (Flu A&B, Covid) Nasopharyngeal Swab     Status: None   Collection Time: 01/10/22  1:28 PM   Specimen: Nasopharyngeal Swab; Nasopharyngeal(NP) swabs in vial transport medium  Result Value Ref Range Status   SARS Coronavirus 2 by RT PCR NEGATIVE NEGATIVE Final    Comment: (NOTE) SARS-CoV-2 target nucleic acids are NOT DETECTED.  The SARS-CoV-2 RNA is generally detectable in upper respiratory specimens during the acute phase of infection. The lowest concentration of SARS-CoV-2 viral  copies this assay can detect is 138 copies/mL. A negative result does not preclude SARS-Cov-2 infection and should not be used as the sole basis for treatment or other patient management decisions. A negative result may occur with  improper specimen collection/handling, submission of specimen other than nasopharyngeal swab, presence of viral mutation(s) within the areas targeted by this assay, and inadequate number of viral copies(<138 copies/mL). A negative result must be combined with clinical observations, patient history, and epidemiological information. The expected result is Negative.  Fact Sheet for Patients:  EntrepreneurPulse.com.au  Fact Sheet for Healthcare Providers:  IncredibleEmployment.be  This test is no t yet approved or cleared by the [place] FDA and  has been authorized for detection and/or diagnosis of SARS-CoV-2 by FDA under an Emergency Use Authorization (EUA). This EUA will remain  in effect (meaning this test can be used) for the duration of the COVID-19 declaration under Section 564(b)(1) of the Act, 21 U.S.C.section 360bbb-3(b)(1), unless the authorization is terminated  or revoked sooner.       Influenza A by PCR NEGATIVE NEGATIVE Final   Influenza B by PCR NEGATIVE NEGATIVE Final    Comment: (NOTE) The Xpert Xpress SARS-CoV-2/FLU/RSV plus assay is intended as an aid in the diagnosis of influenza from Nasopharyngeal swab specimens and should not be used as a sole basis for treatment. Nasal washings and aspirates are unacceptable for Xpert Xpress SARS-CoV-2/FLU/RSV testing.  Fact Sheet for Patients: EntrepreneurPulse.com.au  Fact Sheet for Healthcare Providers: IncredibleEmployment.be  This test is not yet approved or cleared by the [place] and has been authorized  for detection and/or diagnosis of SARS-CoV-2 by FDA under an Emergency Use Authorization (EUA). This  EUA will remain in effect (meaning this test can be used) for the duration of the COVID-19 declaration under Section 564(b)(1) of the Act, 21 U.S.C. section 360bbb-3(b)(1), unless the authorization is terminated or revoked.  Performed at [facility], [place] [address] [place], [place] [address]   Culture, blood (routine x 2)     Status: None (Preliminary result)   Collection Time: 01/10/22  1:33 PM   Specimen: BLOOD  Result Value Ref Range Status   Specimen Description BLOOD RIGHT ANTECUBITAL  Final   Special Requests   Final    BOTTLES DRAWN AEROBIC AND ANAEROBIC Blood Culture adequate volume   Culture   Final    NO GROWTH < 24 HOURS Performed at [facility], [place] [address], [place], [place] [address]    Report Status PENDING  Incomplete  MRSA Next Gen by PCR, Nasal     Status: Abnormal   Collection Time: 01/10/22  3:30 PM   Specimen: Nasal Mucosa; Nasal Swab  Result Value Ref Range Status   MRSA by PCR Next Gen DETECTED (A) NOT DETECTED Final    Comment: RESULT CALLED TO, READ BACK BY AND VERIFIED WITH: M,FUTRELL RN @2035  01/10/22 EB (NOTE) The GeneXpert MRSA Assay (FDA approved for NASAL specimens only), is one component of a comprehensive MRSA colonization surveillance program. It is not intended to diagnose MRSA infection nor to guide or monitor treatment for MRSA infections. Test performance is not FDA approved in patients less than 10 years old. Performed at [facility], [place] [address], [place], [place] [address]      Radiology Studies: CT CHEST WO CONTRAST  Result Date: 01/10/2022 CLINICAL DATA:  Pneumonia. EXAM: CT CHEST WITHOUT CONTRAST TECHNIQUE: Multidetector CT imaging of the chest was performed following the standard protocol without IV contrast. RADIATION DOSE REDUCTION: This exam was performed according to the departmental dose-optimization program which includes automated exposure control, adjustment of the mA and/or kV according to patient  size and/or use of iterative reconstruction technique. COMPARISON:  Same day chest radiograph and chest CT dated 12/20/2021. FINDINGS: Cardiovascular: The right and left pulmonary arteries are enlarged, measuring 2.1 cm and 1.9 cm respectively. Vascular calcifications are seen in the coronary arteries and aortic arch. The heart size is normal. There is no pericardial effusion. Mediastinum/Nodes: No enlarged mediastinal or axillary lymph nodes. Thyroid gland, trachea, and esophagus demonstrate no significant findings. Lungs/Pleura: There is a small right pleural effusion with associated atelectasis. Moderate to severe ground-glass opacities and consolidation are seen the right lower lobe and to a lesser extent the left lower lobe, left upper lobe, right middle lobe, and right upper lobe. This has increased since 12/20/2021. There is no left pleural effusion. There is no pneumothorax. Centrilobular and paraseptal emphysema is redemonstrated. Upper Abdomen: No acute abnormality. Musculoskeletal: Degenerative changes are seen in the spine. IMPRESSION: 1. Ground-glass opacities and consolidation are moderate-to-severe in the right lower lobe and mild in the remaining portions of the lungs. This has increased since 12/20/2021. These findings are suspicious for pneumonia, however malignancy is difficult to exclude. 2. Enlarged left and right pulmonary arteries, suggestive of pulmonary hypertension. Aortic Atherosclerosis (ICD10-I70.0) and Emphysema (ICD10-J43.9). Electronically Signed   By: Zerita Boers M.D.   On: 01/10/2022 16:56   DG Chest Port 1 View  Result Date: 01/10/2022 CLINICAL DATA:  Shortness of breath EXAM: PORTABLE CHEST 1 VIEW COMPARISON:  12/29/2021 and prior studies FINDINGS: Unchanged cardiomediastinal silhouette and CABG changes are again noted. Airspace opacities within the RIGHT lung are again noted. Trace RIGHT pleural effusion again noted. Mild LEFT basilar atelectasis again identified. Decreased  pulmonary vascular congestion noted. No pneumothorax or acute bony abnormality noted. IMPRESSION: Unchanged RIGHT lung airspace opacities and trace RIGHT pleural effusion. Decreased pulmonary vascular congestion. Electronically Signed   By: Margarette Canada M.D.   On: 01/10/2022 14:13    Scheduled Meds:  apixaban  2.5 mg Oral BID   aspirin EC  81 mg Oral Daily   Chlorhexidine Gluconate Cloth  6 each Topical Q0600   clopidogrel  75 mg Oral Daily   empagliflozin  25 mg Oral Daily   feeding supplement  237 mL Oral BID BM   ferrous sulfate  324 mg Oral Q breakfast   furosemide  20 mg Oral Daily   guaiFENesin  1,200 mg Oral BID   insulin aspart  0-5 Units Subcutaneous QHS   insulin aspart  0-9 Units Subcutaneous TID WC   insulin glargine-yfgn  15 Units Subcutaneous Daily   linagliptin  5 mg Oral Daily   metoprolol tartrate  37.5 mg Oral BID   mometasone-formoterol  2 puff Inhalation BID   mupirocin ointment  1 application. Nasal BID   pantoprazole  40 mg Oral Daily   predniSONE  40 mg Oral Q breakfast   rosuvastatin  20 mg Oral Daily   vitamin B-12  1,000 mcg Oral Daily   Continuous Infusions:  ceFEPime (MAXIPIME) IV     [START ON 01/12/2022] vancomycin       LOS: 1 day   Darliss Cheney, MD [facility]  01/11/2022, 11:49 AM   *Please note that this is a verbal dictation therefore any spelling or grammatical errors are due to the "[place] [employer]" system interpretation.  Please page via [place] and do not message via secure chat for urgent patient care matters. Secure chat can be used for non urgent patient care matters.  How to contact the [facility] Attending or Consulting provider [place] or covering provider during after hours [place], for this patient?  Check the care team in [facility] and look for a) attending/consulting [facility] provider listed and b) the [facility] team listed. Page or secure chat [phone]. Log into [email or web address] and use [place]'s universal password to access. If you do not have  the password, please contact the hospital operator. Locate the [facility] provider you are looking for under [facility] and page to a number that you can be directly reached. If you still have difficulty reaching the provider, please page the [facility] (Director on Call) for the Hospitalists listed on amion for assistance.

## 2022-01-12 ENCOUNTER — Inpatient Hospital Stay (HOSPITAL_COMMUNITY): Payer: Medicare Other

## 2022-01-12 DIAGNOSIS — J189 Pneumonia, unspecified organism: Secondary | ICD-10-CM | POA: Diagnosis not present

## 2022-01-12 LAB — GLUCOSE, CAPILLARY: Glucose-Capillary: 243 mg/dL — ABNORMAL HIGH (ref 70–99)

## 2022-01-12 LAB — CBC WITH DIFFERENTIAL/PLATELET
Abs Immature Granulocytes: 0.11 10*3/uL — ABNORMAL HIGH (ref 0.00–0.07)
Basophils Absolute: 0 10*3/uL (ref 0.0–0.1)
Basophils Relative: 0 %
Eosinophils Absolute: 0 10*3/uL (ref 0.0–0.5)
Eosinophils Relative: 0 %
HCT: 26.3 % — ABNORMAL LOW (ref 39.0–52.0)
Hemoglobin: 8.6 g/dL — ABNORMAL LOW (ref 13.0–17.0)
Immature Granulocytes: 1 %
Lymphocytes Relative: 6 %
Lymphs Abs: 1.1 10*3/uL (ref 0.7–4.0)
MCH: 30 pg (ref 26.0–34.0)
MCHC: 32.7 g/dL (ref 30.0–36.0)
MCV: 91.6 fL (ref 80.0–100.0)
Monocytes Absolute: 1.2 10*3/uL — ABNORMAL HIGH (ref 0.1–1.0)
Monocytes Relative: 7 %
Neutro Abs: 14.7 10*3/uL — ABNORMAL HIGH (ref 1.7–7.7)
Neutrophils Relative %: 86 %
Platelets: 363 10*3/uL (ref 150–400)
RBC: 2.87 MIL/uL — ABNORMAL LOW (ref 4.22–5.81)
RDW: 13.7 % (ref 11.5–15.5)
WBC: 17.1 10*3/uL — ABNORMAL HIGH (ref 4.0–10.5)
nRBC: 0 % (ref 0.0–0.2)

## 2022-01-12 LAB — BASIC METABOLIC PANEL
Anion gap: 12 (ref 5–15)
BUN: 40 mg/dL — ABNORMAL HIGH (ref 8–23)
CO2: 22 mmol/L (ref 22–32)
Calcium: 9.1 mg/dL (ref 8.9–10.3)
Chloride: 98 mmol/L (ref 98–111)
Creatinine, Ser: 1.7 mg/dL — ABNORMAL HIGH (ref 0.61–1.24)
GFR, Estimated: 42 mL/min — ABNORMAL LOW (ref >60)
Glucose, Bld: 243 mg/dL — ABNORMAL HIGH (ref 70–99)
Potassium: 4.1 mmol/L (ref 3.5–5.1)
Sodium: 132 mmol/L — ABNORMAL LOW (ref 135–145)

## 2022-01-12 LAB — PROCALCITONIN: Procalcitonin: 6.86 ng/mL

## 2022-01-12 LAB — MAGNESIUM: Magnesium: 2 mg/dL (ref 1.7–2.4)

## 2022-01-12 MED ORDER — DOXYCYCLINE MONOHYDRATE 100 MG PO TABS: 100.0000 mg | ORAL_TABLET | Freq: Two times a day (BID) | ORAL | 0 refills | Status: AC

## 2022-01-12 MED ORDER — AMOXICILLIN-POT CLAVULANATE 500-125 MG PO TABS: 1.0000 | ORAL_TABLET | Freq: Two times a day (BID) | ORAL | 0 refills | Status: AC

## 2022-01-12 MED ORDER — AMOXICILLIN-POT CLAVULANATE 875-125 MG PO TABS: 1.0000 | ORAL_TABLET | Freq: Two times a day (BID) | ORAL | 0 refills | Status: DC

## 2022-01-12 MED ORDER — PREDNISONE 20 MG PO TABS
40.0000 mg | ORAL_TABLET | Freq: Every day | ORAL | 0 refills | Status: AC
Start: 2022-01-13 — End: 2022-01-15

## 2022-01-12 NOTE — Progress Notes (Signed)
Id brief note  Pna admission Mrsa nares negative  Improving on vanc/cefepime Bcx negative Unable to obtain sputum cx    -would change abx to doxy 100 mg po bid and amox-clav 875 mg po bid -plan 2 week abx course -id outpatient referral in 3 weeks on 6/06 @ 930 at [facility] with Dr Elinor Parkinson

## 2022-01-12 NOTE — Evaluation (Signed)
Physical Therapy Evaluation Patient Details Name: Reginald Ayers MRN: [id number] DOB: 04-22-1951 Today's Date: 01/12/2022  History of Present Illness  71 y.o. male presents to [facility] on 01/10/2022 with worsening cough and hypoxia. Chest x-ray demonstrates persistent right mid and lower lobe infiltrates consistent for unresolved pneumonia. Pt recently admitted in May with CVA s/p L ICA stenting. PMH includes PAF on Eliquis, PAD, chronic hypoxic respiratory failure , PVD, COPD, HTN, HLD, IDDM, CHF, CKDII.   Clinical Impression  Pt in bed upon arrival of PT, agreeable to evaluation at this time. Prior to admission the pt was mobilizing with use of RW, reports he can have assist from niece, but does not always need it. The pt now presents with limitations in functional mobility, strength, endurance, power, and stability due to above dx and chronic debility, and will continue to benefit from skilled PT to address these deficits. The pt requires increased assist for bed mobility and sit-stand transfers than mobility level PTA, needing minA to complete sit-stand transfers and to manage small lateral steps to recliner. The pt declined further mobility due to not having his shoes present, but reports fatigue after short bout of stepping to chair, suspect more limited endurance as well. The pt will continue to benefit from skilled PT to progress mobility, strength, and endurance, will be safe to return home with needed DME and family assist.   Pt on RA upon my arrival with complaints of SOB and SpO2 of 87%, placed on 2L with pt reporting sx resolved and SpO2 to 96%.         Recommendations for follow up therapy are one component of a multi-disciplinary discharge planning process, led by the attending physician.  Recommendations may be updated based on patient status, additional functional criteria and insurance authorization.  Follow Up Recommendations Home health PT    Assistance Recommended at  Discharge Frequent or constant Supervision/Assistance  Patient can return home with the following  A little help with walking and/or transfers;A little help with bathing/dressing/bathroom;Assistance with cooking/housework;Direct supervision/assist for medications management;Assist for transportation;Help with stairs or ramp for entrance    Equipment Recommendations BSC/3in1;Wheelchair (measurements PT);Wheelchair cushion (measurements PT)  Recommendations for Other Services       Functional Status Assessment Patient has had a recent decline in their functional status and demonstrates the ability to make significant improvements in function in a reasonable and predictable amount of time.     Precautions / Restrictions Precautions Precautions: Fall;Other (comment) Precaution Comments: watch O2, on 2L for session Restrictions Weight Bearing Restrictions: No      Mobility  Bed Mobility Overal bed mobility: Needs Assistance Bed Mobility: Supine to Sit     Supine to sit: Mod assist, HOB elevated     General bed mobility comments: modA with cues for use of bed rails, pt reaching for bed rail but unable to complete without pulling on therapist.    Transfers Overall transfer level: Needs assistance Equipment used: Rolling walker (2 wheels) Transfers: Sit to/from Stand, Bed to chair/wheelchair/BSC Sit to Stand: Min assist   Step pivot transfers: Min assist       General transfer comment: poor management of RW, minA to boost hips from chair. no overt LOB in standing, needing cues and minA to steady with steps to chair    Ambulation/Gait               General Gait Details: small lateral steps to chair, pt declining further ambulation. SpO2 stable on 2L  Balance Overall balance assessment: Needs assistance Sitting-balance support: Feet supported Sitting balance-Leahy Scale: Fair Sitting balance - Comments: posterior and L lean worsening with fatigue   Standing  balance support: Bilateral upper extremity supported Standing balance-Leahy Scale: Fair Standing balance comment: static stance with single UE support, BUE support for gait                             Pertinent Vitals/Pain Pain Assessment Pain Intervention(s): Monitored during session    Home Living Family/patient expects to be discharged to:: Private residence Living Arrangements: Other relatives;Non-relatives/Friends (niece and roommate) Available Help at Discharge: Friend(s);Available PRN/intermittently Type of Home: House Home Access: Level entry       Home Layout: One level Home Equipment: Rolling Walker (2 wheels);BSC/3in1;Cane - single point;Ada[employer] equipment      Prior Function Prior Level of Function : Independent/Modified Independent             Mobility Comments: RW for ambulation recently ADLs Comments: sponge bathes, niece assists with LB ADLs     Hand Dominance   Dominant Hand: Right    Extremity/Trunk Assessment   Upper Extremity Assessment Upper Extremity Assessment: Defer to OT evaluation    Lower Extremity Assessment Lower Extremity Assessment: RLE deficits/detail;Generalized weakness RLE Deficits / Details: limited knee ROM, Unable to maintain against minimal pressure for knee ext, no knee buckling in stance    Cervical / Trunk Assessment Cervical / Trunk Assessment: Kyphotic;Other exceptions Cervical / Trunk Exceptions: frail  Communication   Communication: HOH  Cognition Arousal/Alertness: Awake/alert Behavior During Therapy: WFL for tasks assessed/performed Overall Cognitive Status: Impaired/Different from baseline Area of Impairment: Safety/judgement, Awareness, Problem solving                         Safety/Judgement: Decreased awareness of safety, Decreased awareness of deficits Awareness: Intellectual Problem Solving: Slow processing, Decreased initiation, Requires verbal cues General Comments: Requires  cues for sequencing and safety        General Comments General comments (skin integrity, edema, etc.): SpO2 87% on RA upon arrival with pt reporting SOB, improved to 96% on 2L        Assessment/Plan    PT Assessment Patient needs continued PT services  PT Problem List Decreased strength;Decreased mobility;Decreased safety awareness;Impaired tone;Decreased range of motion;Decreased coordination;Decreased knowledge of precautions;Decreased activity tolerance;Decreased cognition;Cardiopulmonary status limiting activity;Decreased balance;Decreased knowledge of use of DME;Impaired sensation       PT Treatment Interventions DME instruction;Therapeutic activities;Cognitive remediation;Gait training;Therapeutic exercise;Patient/family education;Stair training;Balance training;Functional mobility training;Neuromuscular re-education;Manual techniques;Wheelchair mobility training    PT Goals (Current goals can be found in the Care Plan section)  Acute Rehab PT Goals Patient Stated Goal: go home PT Goal Formulation: With patient Time For Goal Achievement: 01/26/22 Potential to Achieve Goals: Fair    Frequency Min 3X/week        AM-PAC PT "6 Clicks" Mobility  Outcome Measure Help needed turning from your back to your side while in a flat bed without using bedrails?: A Little Help needed moving from lying on your back to sitting on the side of a flat bed without using bedrails?: A Little Help needed moving to and from a bed to a chair (including a wheelchair)?: A Little Help needed standing up from a chair using your arms (e.g., wheelchair or bedside chair)?: A Little Help needed to walk in hospital room?: Total Help needed climbing 3-5 steps with a railing? :  Total 6 Click Score: 14    End of Session Equipment Utilized During Treatment: Gait belt Activity Tolerance: Patient tolerated treatment well Patient left: in chair;with chair alarm set;with call bell/phone within reach Nurse  Communication: Mobility status PT Visit Diagnosis: Unsteadiness on feet (R26.81);Muscle weakness (generalized) (M62.81)    Time: 6812-7517 PT Time Calculation (min) (ACUTE ONLY): 28 min   Charges:   PT Evaluation $PT Eval Low Complexity: 1 Low PT Treatments $Therapeutic Exercise: 8-22 mins        Vickki Muff, PT, DPT   Acute Rehabilitation Department Pager #: [phone]  Ronnie Derby 01/12/2022, 11:10 AM

## 2022-01-12 NOTE — TOC Transition Note (Signed)
Transition of Care [facility]) - CM/SW Discharge Note   Patient Details  Name: Reginald Ayers MRN: [id number] Date of Birth: October 05, 1950  Transition of [facility]) CM/SW Contact:  Zenon Mayo, RN Phone Number: 01/12/2022, 10:40 AM   Clinical Narrative:    From home with niece and room mate, he has home oxygen 2 liters, niece will bring tank with her when she transports him home today.  He is active with [facility] for [place], [place], [place], and would like to continue with them.  NCM notified [facility] of dc today.  He will need light weight w/chair and shower chair, [employer] will bring this to the room prior to dc.  He is on oxygen also   Final next level of care: [place] Barriers to Discharge: No Barriers Identified   Patient Goals and CMS Choice Patient states their goals for this hospitalization and ongoing recovery are:: home with neice with [facility] CMS Medicare.gov Compare Post Acute Care list provided to:: Patient Choice offered to / list presented to : Patient  Discharge Placement                       Discharge Plan and Services In-house Referral: NA Discharge Planning Services: CM Consult Post Acute Care Choice: Home Health          DME Arranged: High strength lightweight manual wheelchair with seat cushion, Shower stool DME Agency: [employer] Date DME Agency Contacted: 01/12/22 Time DME Agency Contacted: [phone] Representative spoke with at DME Agency: [employer] rep HH Arranged: RN, Disease Management, PT, OT [place] Agency: [place] Date [facility] Agency Contacted: 01/12/22 Time [place]: [phone] Representative spoke with at [place]: [place] ([place]) Interventions     Readmission Risk Interventions    01/12/2022   10:29 AM  Readmission Risk Prevention Plan  Transportation Screening Complete  PCP or Specialist Appt within 3-5 Days Complete  HRI or [place] Complete  Social Work Consult for [place] Planning/Counseling Complete  Palliative Care Screening Not Applicable  Medication Review [employer]) Complete

## 2022-01-12 NOTE — Progress Notes (Signed)
Modified Barium Swallow Progress Note  Patient Details  Name: Reginald Ayers MRN: [id number] Date of Birth: 1951/06/24  Today's Date: 01/12/2022  Modified Barium Swallow completed.  Full report located under Chart Review in the Imaging Section.  Brief recommendations include the following:  Clinical Impression  Pt's oropharyngeal swallow resembles that of MBS on 12/29/21. There was no laryngeal penetration or aspiration present and pt coughed and cleard his throat throughout study which is present during meals and previous MBS.Min-mod vallecular residue and trace-min pyriform sinus residual that a second swallow reduces. Timing of swallow and elevation of larynx was adequate. His esophagus was scanned but not diagnosed without significant stasis observed. Mastication of regular texture was prolonged as he does not have dentition therefore recommended that he continue Dys 3 texture, thin liquids, pills whole in puree, straws allowed and second swallows periodically. Will follow up briefly for education.   Swallow Evaluation Recommendations       SLP Diet Recommendations: Dysphagia 3 (Mech soft) solids;Thin liquid   Liquid Administration via: Straw;Cup   Medication Administration: Whole meds with puree   Supervision: Patient able to self feed   Compensations: Slow rate;Small sips/bites;Minimize environmental distractions;Multiple dry swallows after each bite/sip   Postural Changes: Seated upright at 90 degrees   Oral Care Recommendations: Oral care BID        Royce Macadamia 01/12/2022,10:20 AM

## 2022-01-12 NOTE — Discharge Summary (Addendum)
PatientPhysician Discharge Summary  Reginald Ayers IFO:[id number] DOB: 01-21-1951 DOA: 01/10/2022  PCP: Seward Carol, MD  Admit date: 01/10/2022 Discharge date: 01/12/2022 30 Day Unplanned Readmission Risk Score    Flowsheet Row ED to Hosp-Admission (Current) from 01/10/2022 in [facility]  30 Day Unplanned Readmission Risk Score (%) 26.31 Filed at 01/12/2022 0801       This score is the patient's risk of an unplanned readmission within 30 days of being discharged (0 -100%). The score is based on dignosis, age, lab data, medications, orders, and past utilization.   Low:  0-14.9   Medium: 15-21.9   High: 22-29.9   Extreme: 30 and above          Admitted From: Home Disposition: Home  Recommendations for Outpatient Follow-up:  Follow up with PCP in 1-2 weeks Please obtain BMP/CBC in one week Please follow up with your PCP on the following pending results: Unresulted Labs (From admission, onward)     Start     Ordered   01/12/22 0500  CBC with Differential/Platelet  Daily,   R      01/11/22 0805   01/12/22 6767  Basic metabolic panel  Daily,   R      01/11/22 0805   01/10/22 1605  Expectorated Sputum Assessment w Gram Stain, Rflx to [place]  Once,   R        01/10/22 [place]: Yes Equipment/Devices: Shower chair and wheelchair  Discharge Condition: Stable CODE STATUS: Full code Diet recommendation: Dysphagia 3 with thin liquids  Subjective: Patient seen and examined.  He feels well.  He was in fact off of oxygen and saturating 92% and denied any shortness of breath.  He desires to go home today.  Brief/Interim Summary: Reginald Ayers is a 71 y.o. male with medical history significant of recent right-sided lung pneumonia, recent CVA status post left ICA stenting on aspirin Plavix, PAF on Eliquis, PAD, chronic hypoxic respiratory failure on baseline 2.5 L home O2, PVD, COPD, HTN, HLD, IDDM, chronic borderline systolic CHF, severe  protein calorie malnutrition, chronic iron deficiency anemia, CKD stage II, came with worsening of productive cough and hypoxia for 2 to 3 days. Family found patient O2 saturation dropped to mid 80s despite on 2.5 L oxygen and decided to bring patient to ED.   Upon arrival to ED, borderline tachycardia, no hypotension, O2 saturation 95% on 2.5 L.  Chest x-ray showed persistent right mid and lower lobe infiltrates consistent for unresolved pneumonia. Patient was given vancomycin and cefepime in the ED. details as below.  Acute on chronic hypoxic respiratory failure secondary to HCAP of right middle and lower lobes: Patient was recently discharged from hospital on 12/31/2021 with 2 to 3 L of oxygen.  CT chest without contrast this admission shows groundglass opacities and consolidation, moderate to severe in the right lower lobe and mild in the remaining portion of the lungs.  MRSA screening positive.  He was continued on cefepime and vancomycin.  Patient was assessed by SLP last hospitalization and dysphagia 2 diet was recommended.  He was reassessed by SLP here and underwent MBS and now they are recommending dysphagia 3 diet with thin liquids.  Patient is feeling much better, back at his baseline.  It is to be noted that during recent hospitalization, PT OT had recommended SNF however patient declined that despite our strong recommendation from the  previous hospitalist.  Before PT OT saw him this time, I broached the topic with him if he would like to go to SNF this time, his response was " no, I will go home when I will be ready, if I have to die, I prefer to die at my home with 6 other dogs and all 7 of [place] will go to heaven together.  Let me tell you doc, with how the world is going now and where we are heading, I am sure Jesus will come down very soon and when he comes down, I will go with him".  Discussed with ID about antibiotic choice and they recommended 2 weeks of oral Augmentin +2 weeks of oral  doxycycline and they will follow-up with him on 01/27/2022 in ID clinic.  Acute COPD exacerbation: Patient has no wheezes during my examination.  Will discharge on 2 more days of oral prednisone.   Uncontrolled paroxysmal A-fib: Per admitting hospitalist, patient had A-fib with RVR at the time of admission however EKG shows sinus tachycardia.  Currently he is in sinus rhythm 2.  His metoprolol was increased to 37.5 mg twice daily.  However I am resuming his Toprol-XL 25 mg/same dose that he was taking prior to admission.  Continue Eliquis.   Recent left temporal stroke and left ICA stenosis status post left ICA stenting -As per discharge summary on May 10, patient will need a total of 30 days dual antiplatelet regimen of aspirin and Plavix.  We will continue H&H has been stable so far.   Chronic systolic CHF -LVEF 17-49% commented as borderline low normal function, appears euvolemic.  Continue home dose of Lasix.   AKI on CKD stage II has been documented in the H&P, please note that is incorrect.  Patient appears to be having CKD stage IIIa which is at his baseline.   IDDM type II: Hold metformin.  Resumed Lantus 15 units along with Jardiance and Januvia started here.  Blood sugar controlled.  Continue SSI.   Severe protein calorie malnutrition -Start Ensure   Iron deficiency anemia -H&H stable, no symptoms or signs of bleeding.  On dual antiplatelet and Eliquis regimen for recent ICA stenting and high risk A-fib.   Deconditioning: PT OT consulted.  They recommended home health PT.   Hyperlipidemia: Continue Crestor.   GERD: Continue PPI.  Discharge plan was discussed with patient and/or family member and they verbalized understanding and agreed with it.  Discharge Diagnoses:  Principal Problem:   HCAP (healthcare-associated pneumonia) Active Problems:   COPD (chronic obstructive pulmonary disease) ([place])   Severe protein-energy malnutrition ([place])    Discharge  Instructions   Allergies as of 01/12/2022       Reactions   Actos [pioglitazone] Shortness Of Breath   Leg swelling    Lipitor [atorvastatin] Other (See Comments)   Leg pain    Zestril [lisinopril] Cough        Medication List     TAKE these medications    Accu-Chek Guide w/Device Kit Used to test blood sugars 4 times daily   albuterol 108 (90 Base) MCG/ACT inhaler Commonly known as: VENTOLIN HFA Inhale 2 puffs into the lungs every 4 (four) hours as needed.   amoxicillin-clavulanate 500-125 MG tablet Commonly known as: Augmentin Take 1 tablet (500 mg total) by mouth 2 (two) times daily for 14 days.   Aspirin Low Dose 81 MG tablet Generic drug: aspirin EC Take 1 tablet (81 mg total) by mouth daily. Swallow whole. What changed:  additional instructions   Basaglar KwikPen 100 UNIT/ML Inject 15 Units into the skin daily.   Centrum Silver 50+Men Tabs Take 1 tablet by mouth daily.   clopidogrel 75 MG tablet Commonly known as: PLAVIX Take 75 mg by mouth daily.   doxycycline 100 MG tablet Commonly known as: ADOXA Take 1 tablet (100 mg total) by mouth 2 (two) times daily for 14 days.   Eliquis 2.5 MG Tabs tablet Generic drug: apixaban Take 1 tablet (2.5 mg total) by mouth 2 (two) times daily.   empagliflozin 25 MG Tabs tablet Commonly known as: JARDIANCE Take 25 mg by mouth daily.   furosemide 20 MG tablet Commonly known as: LASIX Take 20 mg by mouth daily.   insulin aspart 100 UNIT/ML FlexPen Commonly known as: NOVOLOG Before each meal 3 times a day, 140-199 - 2 units, 200-250 - 4 units, 251-299 - 6 units,  300-349 - 8 units,  350 or above 10 units.  Insulin PEN if approved, provide syringes and needles if needed. What changed:  how much to take when to take this additional instructions   metFORMIN 500 MG tablet Commonly known as: GLUCOPHAGE Take 1,000 mg by mouth 2 (two) times daily with a meal.   metoprolol succinate 25 MG 24 hr tablet Commonly known  as: TOPROL-XL TAKE 1 TABLET(25 MG) BY MOUTH DAILY   Nitrostat 0.4 MG SL tablet Generic drug: nitroGLYCERIN Take 0.4 mg by mouth every 5 (five) minutes as needed for chest pain.   pantoprazole 40 MG tablet Commonly known as: Protonix Take 1 tablet (40 mg total) by mouth daily.   Pentips 32G X 4 MM Misc Generic drug: Insulin Pen Needle Use to inject insulin 4 times daily.   potassium chloride 10 MEQ tablet Commonly known as: KLOR-CON Take 10 mEq by mouth daily.   predniSONE 20 MG tablet Commonly known as: DELTASONE Take 2 tablets (40 mg total) by mouth daily with breakfast for 2 days. Start taking on: Jan 13, 2022   rosuvastatin 20 MG tablet Commonly known as: CRESTOR Take 1 tablet (20 mg total) by mouth daily.   Spiriva HandiHaler 18 MCG inhalation capsule Generic drug: tiotropium Place 18 mcg into inhaler and inhale daily at 8 pm.   VITAMIN B-12 PO Take 1 tablet by mouth daily.               Durable Medical Equipment  (From admission, onward)           Start     Ordered   01/12/22 1014  For home use only DME Shower stool  Once        01/12/22 1013   01/12/22 1013  For home use only DME lightweight manual wheelchair with seat cushion  Once       Comments: Patient suffers from weakness which impairs their ability to perform daily activities like bathing, dressing, grooming, and toileting in the home.  A cane or walker will not resolve  issue with performing activities of daily living. A wheelchair will allow patient to safely perform daily activities. Patient is not able to propel themselves in the home using a standard weight wheelchair due to general weakness. Patient can self propel in the lightweight wheelchair. Length of need Lifetime. Accessories: elevating leg rests (ELRs), wheel locks, extensions and anti-tippers.   01/12/22 1013            Follow-up Information     Seward Carol, MD. Go on 01/22/2022.   Specialty: Internal Medicine Why:  @  10:00am Contact information: [address] [place] [address] [phone]         Lorretta Harp, MD .   Specialties: Cardiology, Radiology Contact information: [address] [place] [address] [phone]         Seward Carol, MD Follow up in 1 week(s).   Specialty: Internal Medicine Contact information: [address] [place] [address] [phone]         Lorretta Harp, MD .   Specialties: Cardiology, Radiology Contact information: [address] [place] [address] [phone]         [facility] Follow up.   Why: HHRN,HHPT,HHOT- [facility] will call you to schedule apt times Contact information: [place]  [facility] Follow up.   Why: w/chair, shower chair Contact information: [address] [place] [address] [phone]                Allergies  Allergen Reactions   Actos [Pioglitazone] Shortness Of Breath    Leg swelling    Lipitor [Atorvastatin] Other (See Comments)    Leg pain    Zestril [Lisinopril] Cough    Consultations: ID   Procedures/Studies: CT ABDOMEN PELVIS WO CONTRAST  Result Date: 12/20/2021 CLINICAL DATA:  Abdominal pain, possible pneumonia EXAM: CT ABDOMEN AND PELVIS WITHOUT CONTRAST TECHNIQUE: Multidetector CT imaging of the abdomen and pelvis was performed following the standard protocol without IV contrast. Examination degraded by motion artifacts. RADIATION DOSE REDUCTION: This exam was performed according to the departmental dose-optimization program which includes automated exposure control, adjustment of the mA and/or kV according to patient size and/or use of iterative reconstruction technique. COMPARISON:  None FINDINGS: Lower chest: RIGHT lower lobe and mild RIGHT middle lobe infiltrate consistent with pneumonia. Minimal  patchy infiltrate LEFT lower lobe. Hepatobiliary: Distended gallbladder. Liver normal appearance. No calcified gallstones identified. Pancreas: Atrophic pancreas without obvious mass Spleen: Normal appearance Adrenals/Urinary Tract: Adrenal glands unremarkable. LEFT kidney normal appearance. Tiny nonobstructing RIGHT renal calculus without hydronephrosis. Ureters and bladder unremarkable. Stomach/Bowel: Stomach and bowel loops grossly normal appearance. Normal appendix. Vascular/Lymphatic: Extensive atherosclerotic calcifications aorta, iliac arteries, femoral arteries, some visceral arteries, coronary arteries. Aorta normal caliber. Stent RIGHT superficial femoral artery. No adenopathy. Reproductive: Unremarkable prostate gland and seminal vesicles Other: No free air or free fluid.  No hernia. Musculoskeletal: Osseous demineralization. IMPRESSION: RIGHT lower lobe and mild RIGHT middle lobe infiltrate consistent with pneumonia, with minimal patchy infiltrate LEFT lower lobe. Tiny nonobstructing RIGHT renal calculus. Extensive atherosclerotic disease changes with a RIGHT superficial femoral artery stent. No acute intra-abdominal or intrapelvic abnormalities identified within limitations of scattered motion artifacts. Aortic Atherosclerosis (ICD10-I70.0). Electronically Signed   By: Lavonia Dana M.D.   On: 12/20/2021 14:02   CT ANGIO HEAD NECK W WO CM  Result Date: 12/25/2021 CLINICAL DATA:  Provided history: Stroke, follow-up. EXAM: CT ANGIOGRAPHY HEAD AND NECK TECHNIQUE: Multidetector CT imaging of the head and neck was performed using the standard protocol during bolus administration of intravenous contrast. Multiplanar CT image reconstructions and MIPs were obtained to evaluate the vascular anatomy. Carotid stenosis measurements (when applicable) are obtained utilizing NASCET criteria, using the distal internal carotid diameter as the denominator. RADIATION DOSE REDUCTION: This exam was performed according to  the departmental dose-optimization program which includes automated exposure control, adjustment of the mA and/or kV according to patient  size and/or use of iterative reconstruction technique. CONTRAST:  47m OMNIPAQUE IOHEXOL 350 MG/ML SOLN COMPARISON:  Brain MRI 12/21/2021. Head CT 12/20/2021. Chest radiograph 12/22/2021. FINDINGS: CT HEAD FINDINGS Brain: Mild generalized parenchymal atrophy. A known tiny acute/early subacute infarct within the posterior left temporal lobe white matter was better appreciated on the prior brain MRI of 12/21/2021. Redemonstrated small chronic cortical infarcts within the left frontal lobe. Mild patchy and ill-defined hypoattenuation within the cerebral white matter and pons, nonspecific but compatible chronic small vessel ischemic disease. Small chronic infarct within the right cerebellar hemisphere. There is no acute intracranial hemorrhage. No extra-axial fluid collection. No evidence of an intracranial mass. No midline shift. Vascular: No hyperdense vessel.  Atherosclerotic calcifications. Skull: No acute fracture or aggressive osseous lesion. Nonspecific symmetric thinning of the parietal calvarium. Sinuses: No orbital mass or acute orbital finding. Postsurgical appearance of the paranasal sinuses. Frothy secretions, and mild mucosal thickening, within the right frontal sinus. Mild mucosal thickening within the left frontal sinus. Mild mucosal thickening within the bilateral ethmoidectomy cavities. Small volume frothy secretions within the right sphenoid sinus. Small volume frothy secretions, and mild mucosal thickening, within the left sphenoid sinus. Mild mucosal thickening within the bilateral maxillary sinuses. Orbits: Right middle ear/mastoid effusion.  Left mastoid effusion. Review of the MIP images confirms the above findings CTA NECK FINDINGS Aortic arch: Standard aortic branching. Atherosclerotic plaque within the visualized aortic arch and proximal major branch vessels  of the neck. No hemodynamically significant innominate or proximal subclavian artery stenosis. Right carotid system: CCA and ICA patent within the neck. Mild soft plaque within the proximal to mid CCA. Prominent calcified plaque about the carotid bifurcation and within the proximal ICA. Resultant severe stenosis of the proximal ICA (likely greater than 80%). Left carotid system: The CCA is patent. Scattered soft and calcified plaque throughout the CCA resulting in less than 50% stenosis. Prominent atherosclerotic plaque about the carotid bifurcation and within the proximal ICA. The cervical ICA is occluded from its origin. Vertebral arteries: The vertebral arteries are patent within the neck. Mild nonstenotic atherosclerotic plaque at the bilateral vertebral artery origins and within the proximal left V1 segment. Skeleton: No acute fracture or aggressive osseous lesion. Cervical and upper thoracic spondylosis. Bilateral facet joint ankylosis at C3-C4. Congenital incomplete segmentation of the T2-T3 vertebrae. Partially imaged thoracic dextrocurvature. Other neck: No neck mass or cervical lymphadenopathy. Upper chest: Prior median sternotomy. Emphysema. Partially imaged bilateral pleural effusions. Incompletely imaged airspace disease within the superior aspect of the right lower lobe and within the lingula. Review of the MIP images confirms the above findings CTA HEAD FINDINGS Anterior circulation: The left internal carotid artery remains occluded at the precavernous and cavernous segments. There is reconstitution of enhancement within the paraclinoid and supraclinoid left ICA. Calcified plaque results in up to moderate stenosis of the paraclinoid left ICA. The intracranial right ICA is patent. Calcified plaque within this vessel with no more than mild stenosis The M1 middle cerebral arteries are patent. No M2 proximal branch occlusion or high-grade proximal stenosis is identified. The anterior cerebral arteries are  patent. No intracranial aneurysm is identified. Posterior circulation: The intracranial vertebral arteries are patent. Nonstenotic calcified plaque within the V4 left vertebral artery. The basilar artery is patent. The posterior cerebral arteries are patent. Posterior communicating arteries are diminutive or absent, bilaterally. Venous sinuses: Within the limitations of contrast timing, no convincing thrombus. Anatomic variants: As described. Review of the MIP images confirms the above findings IMPRESSION: CT head: 1. Known  tiny acute/early subacute infarct within the left temporal white matter, better appreciated on the brain MRI of 12/21/2021. 2. Background parenchymal atrophy, chronic small vessel ischemic disease and chronic infarcts as described and stable. 3. Paranasal sinus disease. 4. Right middle ear/mastoid effusion. A left mastoid effusion is also present. CTA neck: 1. The left internal carotid artery is occluded throughout the neck. 2. Prominent atherosclerotic plaque about the right carotid bifurcation and within the proximal right ICA with resultant severe stenosis at the origin of the right ICA (likely greater than 80%). 3. Vertebral arteries patent within the neck without stenosis. Mild non-stenotic atherosclerotic plaque within both vessels, as described. 4.  Aortic Atherosclerosis (ICD10-I70.0). 5. Partially imaged airspace disease within the superior aspect of the right lower lobe and within the lingula. 6. Partially imaged bilateral pleural effusions. CTA head: 1. The left internal carotid artery remains occluded at the precavernous and cavernous segments. There is reconstitution of enhancement within the paraclinoid and supraclinoid left ICA. Calcified plaque results in up to moderate stenosis of the paraclinoid left ICA. 2. Elsewhere, no intracranial large vessel occlusion or proximal high-grade arterial stenosis is identified. Mild atherosclerotic plaque within the intracranial right ICA and  V4 left vertebral artery. Electronically Signed   By: Kellie Simmering D.O.   On: 12/25/2021 10:05   DG Chest 1 View  Result Date: 12/20/2021 CLINICAL DATA:  Stroke. EXAM: CHEST  1 VIEW COMPARISON:  09/27/2021 FINDINGS: Normal heart size. Status post median sternotomy and CABG procedure. Diffuse chronic interstitial coarsening identified. Dense airspace consolidation is identified involving the right lower lobe compatible with pneumonia and or aspiration. Left lung is clear. IMPRESSION: Right lower lobe airspace consolidation compatible with pneumonia and/or aspiration. Electronically Signed   By: Kerby Moors M.D.   On: 12/20/2021 13:36   CT Head Wo Contrast  Result Date: 12/20/2021 CLINICAL DATA:  Mental status change, unknown cause EXAM: CT HEAD WITHOUT CONTRAST TECHNIQUE: Contiguous axial images were obtained from the base of the skull through the vertex without intravenous contrast. RADIATION DOSE REDUCTION: This exam was performed according to the departmental dose-optimization program which includes automated exposure control, adjustment of the mA and/or kV according to patient size and/or use of iterative reconstruction technique. COMPARISON:  2019 FINDINGS: Brain: There is no acute intracranial hemorrhage, mass effect, or edema. Gray-white differentiation is preserved. There is no extra-axial fluid collection. Prominence of the ventricles and sulci reflects similar parenchymal volume loss. Patchy hypoattenuation in the supratentorial and pontine white matter is nonspecific but may reflect chronic microvascular ischemic changes. Vascular: There is atherosclerotic calcification at the skull base. Skull: Calvarium is unremarkable apart from incidental thinning of the parietal calvarium bilaterally. Sinuses/Orbits: Extensive sinonasal surgery with mucosal thickening. Bilateral lens replacements. Other: Patchy mastoid opacification. IMPRESSION: No acute intracranial abnormality. Chronic microvascular  ischemic changes. Electronically Signed   By: Macy Mis M.D.   On: 12/20/2021 12:05   CT CHEST WO CONTRAST  Result Date: 01/10/2022 CLINICAL DATA:  Pneumonia. EXAM: CT CHEST WITHOUT CONTRAST TECHNIQUE: Multidetector CT imaging of the chest was performed following the standard protocol without IV contrast. RADIATION DOSE REDUCTION: This exam was performed according to the departmental dose-optimization program which includes automated exposure control, adjustment of the mA and/or kV according to patient size and/or use of iterative reconstruction technique. COMPARISON:  Same day chest radiograph and chest CT dated 12/20/2021. FINDINGS: Cardiovascular: The right and left pulmonary arteries are enlarged, measuring 2.1 cm and 1.9 cm respectively. Vascular calcifications are seen in the coronary arteries  and aortic arch. The heart size is normal. There is no pericardial effusion. Mediastinum/Nodes: No enlarged mediastinal or axillary lymph nodes. Thyroid gland, trachea, and esophagus demonstrate no significant findings. Lungs/Pleura: There is a small right pleural effusion with associated atelectasis. Moderate to severe ground-glass opacities and consolidation are seen the right lower lobe and to a lesser extent the left lower lobe, left upper lobe, right middle lobe, and right upper lobe. This has increased since 12/20/2021. There is no left pleural effusion. There is no pneumothorax. Centrilobular and paraseptal emphysema is redemonstrated. Upper Abdomen: No acute abnormality. Musculoskeletal: Degenerative changes are seen in the spine. IMPRESSION: 1. Ground-glass opacities and consolidation are moderate-to-severe in the right lower lobe and mild in the remaining portions of the lungs. This has increased since 12/20/2021. These findings are suspicious for pneumonia, however malignancy is difficult to exclude. 2. Enlarged left and right pulmonary arteries, suggestive of pulmonary hypertension. Aortic  Atherosclerosis (ICD10-I70.0) and Emphysema (ICD10-J43.9). Electronically Signed   By: Zerita Boers M.D.   On: 01/10/2022 16:56   CT Chest Wo Contrast  Result Date: 12/20/2021 CLINICAL DATA:  Pneumonia.  Evaluate for complications. EXAM: CT CHEST WITHOUT CONTRAST TECHNIQUE: Multidetector CT imaging of the chest was performed following the standard protocol without IV contrast. RADIATION DOSE REDUCTION: This exam was performed according to the departmental dose-optimization program which includes automated exposure control, adjustment of the mA and/or kV according to patient size and/or use of iterative reconstruction technique. COMPARISON:  None. FINDINGS: Cardiovascular: The heart size is normal. Aortic atherosclerosis. Status post CABG. No pericardial effusion. Mediastinum/Nodes: Thyroid gland, trachea and esophagus demonstrate no significant findings. No enlarged axillary, supraclavicular, or mediastinal lymph nodes. Hilar lymph nodes are suboptimally evaluated due to lack of IV contrast. Lungs/Pleura: Moderate changes of centrilobular and paraseptal emphysema with diffuse bronchial wall thickening. Dense airspace consolidation is identified within the right lower lobe. There is also airspace densities within the inferior right middle lobe. Pat patchy subpleural densities identified within the medial left lower lobe and posterior left base. The no pleural effusions or interstitial edema. No suspicious pulmonary nodule or mass identified at this time. Upper Abdomen: No acute abnormality Musculoskeletal: No chest wall mass or suspicious bone lesions identified. Status post median sternotomy IMPRESSION: 1. Dense airspace consolidation within the right lower lobe with additional airspace densities within the inferior right middle lobe, medial left lower lobe and posterior left base. Findings are compatible with multifocal pneumonia. Followup imaging is recommended in 3-4 weeks following trial of antibiotic  therapy to ensure resolution and exclude underlying malignancy. 2. Diffuse bronchial wall thickening with emphysema, as above; imaging findings suggestive of underlying COPD. 3. Status post CABG. 4. Aortic Atherosclerosis (ICD10-I70.0) and Emphysema (ICD10-J43.9). Electronically Signed   By: Kerby Moors M.D.   On: 12/20/2021 14:04   MR BRAIN WO CONTRAST  Result Date: 12/26/2021 CLINICAL DATA:  Stroke follow-up EXAM: MRI HEAD WITHOUT CONTRAST TECHNIQUE: Multiplanar, multiecho pulse sequences of the brain and surrounding structures were obtained without intravenous contrast. COMPARISON:  12/21/2021 FINDINGS: Motion degraded study. Brain: No acute infarct. The area of diffusion abnormality previously seen in the left temporal lobe is less conspicuous today. No acute or chronic hemorrhage. There is multifocal hyperintense T2-weighted signal within the white matter. Generalized atrophy. The midline structures are normal. Vascular: Major flow voids are preserved. Skull and upper cervical spine: Normal calvarium and skull base. Visualized upper cervical spine and soft tissues are normal. Sinuses/Orbits:No paranasal sinus fluid levels or advanced mucosal thickening. No mastoid or  middle ear effusion. Normal orbits. IMPRESSION: 1. Motion degraded study. 2. No acute intracranial abnormality. The area of diffusion abnormality previously seen in the left temporal lobe is less conspicuous today. Electronically Signed   By: Ulyses Jarred M.D.   On: 12/26/2021 23:38   MR BRAIN WO CONTRAST  Addendum Date: 12/21/2021   ADDENDUM REPORT: 12/21/2021 11:41 ADDENDUM: Study discussed by telephone with Dr. Elsworth Soho on 12/21/2021 at 1133 hours. Electronically Signed   By: Genevie Ann M.D.   On: 12/21/2021 11:41   Result Date: 12/21/2021 CLINICAL DATA:  71 year old male with altered mental status. EXAM: MRI HEAD WITHOUT CONTRAST TECHNIQUE: Multiplanar, multiecho pulse sequences of the brain and surrounding structures were obtained without  intravenous contrast. COMPARISON:  Head CT yesterday and earlier. FINDINGS: Brain: Small area of linear restricted diffusion in the white matter of the left temporal stem on series 2, images 24 and 25. No other convincing restricted diffusion. No midline shift, mass effect, evidence of mass lesion, ventriculomegaly, extra-axial collection or acute intracranial hemorrhage. Cervicomedullary junction and pituitary are within normal limits. Small chronic infarct right inferior cerebellum. Small area of chronic appearing cortical encephalomalacia in the anterior left superior frontal gyrus on series 6, image 26 and left middle frontal gyrus on the same image. Asymmetric additional patchy left cerebral white matter T2 and FLAIR hyperintensity. Mild to moderate T2 and FLAIR heterogeneity in the pons. No other cortical encephalomalacia identified. No definite chronic cerebral blood products. Deep gray nuclei remain within normal limits for age. Vascular: Loss of the cervical left ICA and left ICA siphon flow void with evidence of reconstituted flow at the left ICA terminus. a asymmetric FLAIR hyperintensity of left MCA branches in the left hemisphere. Other Major intracranial vascular flow voids are preserved. Skull and upper cervical spine: Negative visible cervical spine. Visualized bone marrow signal is within normal limits. Sinuses/Orbits: Previous paranasal sinus surgery with generalized sinus mucosal thickening. Retained secretions in the visible pharynx. Negative orbits with postoperative changes to both globes. Other: Mild bilateral mastoid effusions. IMPRESSION: 1. Left ICA with poor flow or occlusion in the neck and at the skull base. Evidence of reconstituted flow at the Left ICA terminus by MRI, but also asymmetric decreased flow in Left MCA branches. 2. However, only trace acute ischemia is identified in the Left MCA territory (left temporal lobe white matter) with chronic cortical infarcts in the left middle  and superior frontal gyri. 3. Evidence of some underlying chronic small vessel disease including in the pons. Electronically Signed: By: Genevie Ann M.D. On: 12/21/2021 11:20   DG Chest Port 1 View  Result Date: 01/10/2022 CLINICAL DATA:  Shortness of breath EXAM: PORTABLE CHEST 1 VIEW COMPARISON:  12/29/2021 and prior studies FINDINGS: Unchanged cardiomediastinal silhouette and CABG changes are again noted. Airspace opacities within the RIGHT lung are again noted. Trace RIGHT pleural effusion again noted. Mild LEFT basilar atelectasis again identified. Decreased pulmonary vascular congestion noted. No pneumothorax or acute bony abnormality noted. IMPRESSION: Unchanged RIGHT lung airspace opacities and trace RIGHT pleural effusion. Decreased pulmonary vascular congestion. Electronically Signed   By: Margarette Canada M.D.   On: 01/10/2022 14:13   DG Chest Port 1 View  Result Date: 12/29/2021 CLINICAL DATA:  Shortness of breath and emphysema. EXAM: PORTABLE CHEST 1 VIEW COMPARISON:  12/22/2021. FINDINGS: 6:30 a.m., 12/29/2021. The cardiac size is normal. There are old CABG changes and aortic atherosclerosis. Stable mediastinum. Interval extubation, removal NGT. There is increased central vascular prominence which could be congestive or  from fluid overload. There are increased interstitial and patchy consolidative opacities in the right mid and lower lung field, increased small right pleural effusion. Findings are probably due to multilobar pneumonia with worsening in the interval. Left lung demonstrating emphysematous and chronic changes without new infiltrate. Osteopenia and thoracic spondylosis. IMPRESSION: 1. Worsening opacities right mid and lower lung field most likely due to pneumonia. 2. Underlying edema component not excluded due to the also noted increased central vascular prominence. 3. Increased small right pleural effusion. Stable minimal left pleural effusion. 4. Interval extubation, removal NGT.  Electronically Signed   By: Telford Nab M.D.   On: 12/29/2021 06:48   DG CHEST PORT 1 VIEW  Result Date: 12/22/2021 CLINICAL DATA:  Pneumonia.  COPD. EXAM: PORTABLE CHEST 1 VIEW COMPARISON:  December 21, 2021. FINDINGS: The heart size and mediastinal contours are within normal limits. Endotracheal and nasogastric tubes are in grossly good position. Status post coronary bypass graft. Stable bibasilar opacities are noted, right greater than left, concerning for atelectasis or possibly infiltrate. Small right pleural effusion is noted. The visualized skeletal structures are unremarkable. IMPRESSION: Stable bibasilar opacities are noted, right greater than left, concerning for atelectasis or possibly infiltrates. Small right pleural effusion is noted. Stable support apparatus. Electronically Signed   By: Marijo Conception M.D.   On: 12/22/2021 11:39   DG CHEST PORT 1 VIEW  Result Date: 12/21/2021 CLINICAL DATA:  Pneumonia. EXAM: PORTABLE CHEST 1 VIEW COMPARISON:  December 20, 2021. FINDINGS: Endotracheal tube now proximally 1.6 cm from the carina. Gastric tube coursing through in off the field of the radiograph. Side port off field of view, tip likely beyond gastric antrum. Signs of median sternotomy and CABG as before. Image rotated to the RIGHT with EKG leads projecting over the patient's chest. Accounting for rotation cardiomediastinal contours and hilar structures are stable. Persistent basilar airspace disease now with coarse interstitial markings also at the LEFT lung base or more prominent due to rotation in this patient with bilateral airspace disease worse on the RIGHT. No gross effusion. On limited assessment there is no acute skeletal process. IMPRESSION: 1. Endotracheal tube now 1.6 cm from the carina. Consider slight retraction as warranted approximately 1.5 - 2 cm for more optimal placement 2. Signs of basilar pneumonia worse on the RIGHT. These results will be called to the ordering clinician or  representative by the Radiologist Assistant, and communication documented in the PACS or [employer]. Electronically Signed   By: Zetta Bills M.D.   On: 12/21/2021 09:17   DG Chest Portable 1 View  Addendum Date: 12/20/2021   ADDENDUM REPORT: 12/20/2021 16:37 ADDENDUM: These results were called by telephone at the time of interpretation on 12/20/2021 at 4:36 pm to provider Dr. Kipp Brood , who verbally acknowledged these results. Electronically Signed   By: Zetta Bills M.D.   On: 12/20/2021 16:37   Result Date: 12/20/2021 CLINICAL DATA:  Post intubation in a 71 year old male. EXAM: PORTABLE CHEST 1 VIEW COMPARISON:  Imaging from 12/20/2021. FINDINGS: Median sternotomy changes. Pacer defibrillator pads project over the patient's chest and obscure the central chest partially. Post gastric tube insertion, side port below GE junction, tip likely in the gastric antrum or in the proximal duodenum off the field of view. Endotracheal tube in site 2. Localization of carina slightly impaired by overlying structures including pacer defibrillator pads but suspect the tube is approximately 5-7 mm above the carina. Cardiomediastinal contours and hilar structures are stable. Patchy ill-defined airspace disease noted  at the RIGHT lung base. No pneumothorax. No sign of pleural effusion. On limited assessment there is no acute skeletal process. IMPRESSION: 1. Endotracheal tube tip appears to be at the carina though with difficult assessment on current imaging. Consider approximately 2.5 cm retraction for more optimal placement. 2. Post gastric tube insertion, side port below the GE junction, tip likely in the gastric antrum or in the proximal duodenum off the field of view. 3. Signs of RIGHT lower lobe pneumonia or aspiration. LEFT lung base airspace process not as well seen as on recent chest CT. Electronically Signed: By: Zetta Bills M.D. On: 12/20/2021 16:29   DG Swallowing Func-Speech Pathology  Result  Date: 01/12/2022 Table formatting from the original result was not included. Objective Swallowing Evaluation: Type of Study: MBS-Modified Barium Swallow Study  Patient Details Name: Reginald Ayers MRN: [id number] Date of Birth: 01-Nov-1950 Today's Date: 01/12/2022 Time: SLP Start Time (ACUTE ONLY): 5427 -SLP Stop Time (ACUTE ONLY): 0907 SLP Time Calculation (min) (ACUTE ONLY): 11 min Past Medical History: Past Medical History: Diagnosis Date  Carotid artery disease ([place])   L-ICA 100%, mod R-ICA dz  COPD (chronic obstructive pulmonary disease) ([place])   Coronary artery disease   s/p CABG February 2010 by Dr. Merilynn Finland  Diabetes [facility])   Emphysema lung ([place])   Hyperlipidemia   Hypertension   Peripheral arterial disease ([place])   post left common iliac and right SFA stenting remotely  Tobacco abuse     Past Surgical History: Past Surgical History: Procedure Laterality Date  ABDOMINAL AORTOGRAM N/A 04/20/2017  Procedure: ABDOMINAL AORTOGRAM;  Surgeon: Serafina Mitchell, MD;  Location: [facility];  Service: Cardiovascular;  Laterality: N/A;  ABDOMINAL AORTOGRAM W/LOWER EXTREMITY Right 12/08/2016  Procedure: Abdominal Aortogram w/Lower Extremity;  Surgeon: Serafina Mitchell, MD;  Location: [place] [facility];  Service: Cardiovascular;  Laterality: Right;  ABDOMINAL AORTOGRAM W/LOWER EXTREMITY N/A 09/07/2017  Procedure: ABDOMINAL AORTOGRAM W/LOWER EXTREMITY;  Surgeon: Serafina Mitchell, MD;  Location: [place] [facility];  Service: Cardiovascular;  Laterality: N/A;  CARDIAC CATHETERIZATION  2010  CAROTID ANGIOGRAM  2014  CORONARY ARTERY BYPASS GRAFT  2010  LIMA-LAD, Lrad-OM1, SVG-RI, SVG-AM-dRCA  DOPPLER ECHOCARDIOGRAPHY  2010  LOWER EXTREMITY ANGIOGRAM  11/2006  left common femoral endarterectomy and patch angioplasty:The mid right SFA was angioplastied with a 4 x 8  LOWER EXTREMITY ANGIOGRAM  2003  left common iliac artery stenting by Dr. Adora Fridge  LOWER EXTREMITY ANGIOGRAPHY Right 04/20/2017  Procedure: Lower Extremity  Angiography;  Surgeon: Serafina Mitchell, MD;  Location: [place] [facility];  Service: Cardiovascular;  Laterality: Right;  NM MYOVIEW LTD  2015  PERIPHERAL VASCULAR ATHERECTOMY Right 12/08/2016  Procedure: Peripheral Vascular Atherectomy;  Surgeon: Serafina Mitchell, MD;  Location: [place] [facility] LAB;  Service: Cardiovascular;  Laterality: Right;  PERIPHERAL VASCULAR BALLOON ANGIOPLASTY Right 12/08/2016  Procedure: Peripheral Vascular Balloon Angioplasty;  Surgeon: Serafina Mitchell, MD;  Location: [place] [facility] LAB;  Service: Cardiovascular;  Laterality: Right;  PERIPHERAL VASCULAR BALLOON ANGIOPLASTY Right 04/20/2017  Procedure: PERIPHERAL VASCULAR BALLOON ANGIOPLASTY;  Surgeon: Serafina Mitchell, MD;  Location: [place] [facility];  Service: Cardiovascular;  Laterality: Right;  SFA  PERIPHERAL VASCULAR CATHETERIZATION N/A 09/22/2016  Procedure: Abdominal Aortogram w/ bilateral Lower Extremity Runoff;  Surgeon: Serafina Mitchell, MD;  Location: [place] [facility];  Service: Cardiovascular;  Laterality: N/A;  PERIPHERAL VASCULAR CATHETERIZATION Right 09/22/2016  Procedure: Peripheral Vascular Intervention;  Surgeon: Serafina Mitchell, MD;  Location: [place] [facility] LAB;  Service: Cardiovascular;  Laterality: Right;  TRANSCAROTID ARTERY REVASCULARIZATION  Right 12/26/2021  Procedure: Right Transcarotid Artery Revascularization;  Surgeon: Serafina Mitchell, MD;  Location[facility] OR;  Service: Vascular;  Laterality: Right;  ULTRASOUND GUIDANCE FOR VASCULAR ACCESS Left 12/26/2021  Procedure: ULTRASOUND GUIDANCE FOR VASCULAR ACCESS, LEFT FEMORAL VEIN;  Surgeon: Serafina Mitchell, MD;  Location: [facility] OR;  Service: Vascular;  Laterality: Left; HPI: 71 year old man with history of recent right carotid stent placement, dysphagia, CVA, A-fib presents to the hospital with productive cough and shortness of breath, be hypoxemic with imaging findings consistent with right middle lobe and right lower lobe pneumonia with associated parapneumonic effusion.  Recent hospitalization for CVA with MBS 5/8  > flash penetration elicited cough, stasis in esophagus (possible account for cough), no dentition and needs soft foods > Dys 2/thin rec'd. Follow up revealed regurgitation during meals. Disussed with dtr and diet downgraded to puree. PMH: COPD, DM, CAD.  Subjective: alert  Recommendations for follow up therapy are one component of a multi-disciplinary discharge planning process, led by the attending physician.  Recommendations may be updated based on patient status, additional functional criteria and insurance authorization. Assessment / Plan / Recommendation   01/12/2022  10:06 AM Clinical Impressions Clinical Impression Pt's oropharyngeal swallow resembles that of MBS on 12/29/21. There was no laryngeal penetration or aspiration present and pt coughed and cleard his throat throughout study which is present during meals and previous MBS.Min-mod vallecular residue and trace-min pyriform sinus residual that a second swallow reduces. Timing of swallow and elevation of larynx was adequate. His esophagus was scanned but not diagnosed without significant stasis observed. Mastication of regular texture was prolonged as he does not have dentition therefore recommended that he continue Dys 3 texture, thin liquids, pills whole in puree, straws allowed and second swallows periodically. Will follow up briefly for education. SLP Visit Diagnosis Dysphagia, pharyngeal phase (R13.13) Impact on safety and function Mild aspiration risk     01/12/2022  10:06 AM Treatment Recommendations Treatment Recommendations Therapy as outlined in treatment plan below     01/12/2022  10:06 AM Prognosis Prognosis for Safe Diet Advancement Good   01/12/2022  10:06 AM Diet Recommendations SLP Diet Recommendations Dysphagia 3 (Mech soft) solids;Thin liquid Liquid Administration via Straw;Cup Medication Administration Whole meds with puree Compensations Slow rate;Small sips/bites;Minimize environmental  distractions;Multiple dry swallows after each bite/sip Postural Changes Seated upright at 90 degrees     01/12/2022  10:06 AM Other Recommendations Oral Care Recommendations Oral care BID Follow Up Recommendations No SLP follow up Assistance recommended at discharge Set up Supervision/Assistance Functional Status Assessment Patient has had a recent decline in their functional status and demonstrates the ability to make significant improvements in function in a reasonable and predictable amount of time.   01/12/2022  10:06 AM Frequency and Duration  Speech Therapy Frequency (ACUTE ONLY) min 1 x/week Treatment Duration 1 week     01/12/2022  10:06 AM Oral Phase Oral - Thin Cup WFL Oral - Thin Straw WFL Oral - Puree WFL Oral - Mech Soft NT Oral - Regular Other (Comment) Oral - Pill NT    01/12/2022  10:06 AM Pharyngeal Phase Pharyngeal Phase Impaired Pharyngeal- Thin Cup Pharyngeal residue - valleculae;Pharyngeal residue - pyriform Pharyngeal- Thin Straw Pharyngeal residue - valleculae;Pharyngeal residue - pyriform Pharyngeal Material does not enter airway Pharyngeal- Puree WFL Pharyngeal- Mechanical Soft NT Pharyngeal- Regular Pharyngeal residue - valleculae Pharyngeal- Pill NT    01/12/2022  10:06  AM Cervical Esophageal Phase  Cervical Esophageal Phase WFL [place] Siren 01/12/2022, 10:19 AM                     DG Swallowing Func-Speech Pathology  Result Date: 12/29/2021 Table formatting from the original result was not included. Images from the original result were not included. Objective Swallowing Evaluation: Type of Study: MBS-Modified Barium Swallow Study  Patient Details Name: Reginald Ayers MRN: [id number] Date of Birth: 1951/07/22 Today's Date: 12/29/2021 Time: SLP Start Time (ACUTE ONLY): 47 -SLP Stop Time (ACUTE ONLY): 1340 SLP Time Calculation (min) (ACUTE ONLY): 20 min Past Medical History: Past Medical History: Diagnosis Date  Carotid artery disease ([place])   L-ICA 100%, mod R-ICA dz  COPD (chronic  obstructive pulmonary disease) ([place])   Coronary artery disease   s/p CABG February 2010 by Dr. Merilynn Finland  Diabetes [facility])   Emphysema lung ([place])   Hyperlipidemia   Hypertension   Peripheral arterial disease ([place])   post left common iliac and right SFA stenting remotely  Tobacco abuse     Past Surgical History: Past Surgical History: Procedure Laterality Date  ABDOMINAL AORTOGRAM N/A 04/20/2017  Procedure: ABDOMINAL AORTOGRAM;  Surgeon: Serafina Mitchell, MD;  Location: [place] [facility] LAB;  Service: Cardiovascular;  Laterality: N/A;  ABDOMINAL AORTOGRAM W/LOWER EXTREMITY Right 12/08/2016  Procedure: Abdominal Aortogram w/Lower Extremity;  Surgeon: Serafina Mitchell, MD;  Location: [place] [facility] LAB;  Service: Cardiovascular;  Laterality: Right;  ABDOMINAL AORTOGRAM W/LOWER EXTREMITY N/A 09/07/2017  Procedure: ABDOMINAL AORTOGRAM W/LOWER EXTREMITY;  Surgeon: Serafina Mitchell, MD;  Location: [place] [facility] LAB;  Service: Cardiovascular;  Laterality: N/A;  CARDIAC CATHETERIZATION  2010  CAROTID ANGIOGRAM  2014  CORONARY ARTERY BYPASS GRAFT  2010  LIMA-LAD, Lrad-OM1, SVG-RI, SVG-AM-dRCA  DOPPLER ECHOCARDIOGRAPHY  2010  LOWER EXTREMITY ANGIOGRAM  11/2006  left common femoral endarterectomy and patch angioplasty:The mid right SFA was angioplastied with a 4 x 8  LOWER EXTREMITY ANGIOGRAM  2003  left common iliac artery stenting by Dr. Adora Fridge  LOWER EXTREMITY ANGIOGRAPHY Right 04/20/2017  Procedure: Lower Extremity Angiography;  Surgeon: Serafina Mitchell, MD;  Location: [place] [facility] LAB;  Service: Cardiovascular;  Laterality: Right;  NM MYOVIEW LTD  2015  PERIPHERAL VASCULAR ATHERECTOMY Right 12/08/2016  Procedure: Peripheral Vascular Atherectomy;  Surgeon: Serafina Mitchell, MD;  Location: [place] CV LAB;  Service: Cardiovascular;  Laterality: Right;  PERIPHERAL VASCULAR BALLOON ANGIOPLASTY Right 12/08/2016  Procedure: Peripheral Vascular Balloon Angioplasty;  Surgeon: Serafina Mitchell, MD;  Location: [place] [facility] LAB;   Service: Cardiovascular;  Laterality: Right;  PERIPHERAL VASCULAR BALLOON ANGIOPLASTY Right 04/20/2017  Procedure: PERIPHERAL VASCULAR BALLOON ANGIOPLASTY;  Surgeon: Serafina Mitchell, MD;  Location: [place] [facility] LAB;  Service: Cardiovascular;  Laterality: Right;  SFA  PERIPHERAL VASCULAR CATHETERIZATION N/A 09/22/2016  Procedure: Abdominal Aortogram w/ bilateral Lower Extremity Runoff;  Surgeon: Serafina Mitchell, MD;  Location: [place] [facility] LAB;  Service: Cardiovascular;  Laterality: N/A;  PERIPHERAL VASCULAR CATHETERIZATION Right 09/22/2016  Procedure: Peripheral Vascular Intervention;  Surgeon: Serafina Mitchell, MD;  Location: [place] [facility] LAB;  Service: Cardiovascular;  Laterality: Right; HPI: 71 yo male presented 4/29 with sepsis secondary to CAP, requiring intubation and mechanical ventilation for respiratory failure. ETT 4/29-5/2. MRI: punctate left temporal infarct; chronic cortical infarcts in the left middle and superior frontal gyri.  History of bilateral carotid artery stenosis (total occlusion of the left, moderate stenosis of the right), HTN, HLD,  prior tobacco abuse, diabetes, coronary artery disease s/p CABG, peripheral vascular disease s/p stents. Follow up for dysphagia management indicated. Right carotid stent placed 12/26/2021. Post-op with left hemiparesis; MRI no acute changes; resolved weakness.  Subjective: alert  Recommendations for follow up therapy are one component of a multi-disciplinary discharge planning process, led by the attending physician.  Recommendations may be updated based on patient status, additional functional criteria and insurance authorization. Assessment / Plan / Recommendation   12/29/2021   1:00 PM Clinical Impressions Clinical Impression Pt demonstrates minimal changes in swallowing related to age. There is some trace to mild lingual, base of tongue and pharyngeal residue likely due to changes in mucosal tension. no significant residue or weakness. There was one  instance of flash penetration with thin that did elicit coughing. An esophageal sweep showed stasis with soldis that cleared with a liquid wash. THis may account for delayed coughing. Pt advised to follow pills with a bite of puree. He does not have dentition and needs fairly soft foods. Today he struggled a bit with dys 3 vegetables. Will try dys 2 (fine chop/minced) with thin liquids. SLP Visit Diagnosis Dysphagia, unspecified (R13.10) Impact on safety and function Mild aspiration risk     12/29/2021   1:00 PM Treatment Recommendations Treatment Recommendations Therapy as outlined in treatment plan below     12/29/2021   1:00 PM Prognosis Prognosis for Safe Diet Advancement Good   12/29/2021   1:00 PM Diet Recommendations SLP Diet Recommendations Dysphagia 2 (Fine chop) solids;Thin liquid Liquid Administration via Cup;Straw Medication Administration Whole meds with puree Compensations Slow rate;Small sips/bites;Follow solids with liquid Postural Changes Remain semi-upright after after feeds/meals (Comment)     12/29/2021   1:00 PM Other Recommendations Oral Care Recommendations Oral care BID Follow Up Recommendations No SLP follow up Assistance recommended at discharge Set up Supervision/Assistance   12/23/2021   4:00 PM Frequency and Duration  Speech Therapy Frequency (ACUTE ONLY) min 2x/week Treatment Duration 2 weeks     12/29/2021   1:00 PM Oral Phase Oral Phase Impaired Oral - Thin Cup Lingual/palatal residue Oral - Thin Straw Lingual/palatal residue Oral - Puree Lingual/palatal residue Oral - Mech Soft Lingual/palatal residue Oral - Pill Lingual/palatal residue    12/29/2021   1:00 PM Pharyngeal Phase Pharyngeal Phase Impaired Pharyngeal- Thin Cup Pharyngeal residue - valleculae;Pharyngeal residue - pyriform;WFL Pharyngeal- Thin Straw Pharyngeal residue - valleculae;Pharyngeal residue - pyriform;Penetration/Apiration after swallow Pharyngeal Material enters airway, remains ABOVE vocal cords then ejected out;Material does  not enter airway     View : No data to display.    DeBlois, Katherene Ponto 12/29/2021, 2:41 PM                     VAS [place] ABI WITH/WO TBI  Result Date: 12/15/2021  LOWER EXTREMITY DOPPLER STUDY Patient Name:  Reginald Ayers  Date of Exam:   12/15/2021 Medical Rec #: [id number]       Accession #:    [id number] Date of Birth: 14-Sep-1950       Patient Gender: M Patient Age:   5 years Exam Location:  [facility] Procedure:      VAS [place] ABI WITH/WO TBI Referring Phys: Harold Barban --------------------------------------------------------------------------------  Indications: Peripheral artery disease. High Risk         Hypertension, hyperlipidemia, Diabetes, coronary artery Factors:          disease.  Vascular Interventions: 04/20/2017: Drug-coated balloon and plasty, right  superficial femoral artery. Balloon angioplasty, right                         common femoral artery.                          12/08/2016: Atherectomy with angioplasty, right                         posterior tibial artery.                          09/22/2016: Stent of right superficial femoral/popliteal                         artery. Comparison Study: 05/03/2019: Rt ABI 0.61; Lt ABI 0.46 Performing Technologist: Ivan Croft  Examination Guidelines: A complete evaluation includes at minimum, Doppler waveform signals and systolic blood pressure reading at the level of bilateral brachial, anterior tibial, and posterior tibial arteries, when vessel segments are accessible. Bilateral testing is considered an integral part of a complete examination. Photoelectric Plethysmograph (PPG) waveforms and toe systolic pressure readings are included as required and additional duplex testing as needed. Limited examinations for reoccurring indications may be performed as noted.  ABI Findings: +---------+------------------+-----+----------+--------+ Right    Rt Pressure (mmHg)IndexWaveform  Comment   +---------+------------------+-----+----------+--------+ Brachial 145                                       +---------+------------------+-----+----------+--------+ PTA      118               0.81 biphasic           +---------+------------------+-----+----------+--------+ DP       110               0.76 monophasic         +---------+------------------+-----+----------+--------+ Great Toe64                0.44                    +---------+------------------+-----+----------+--------+ +---------+------------------+-----+----------+-------+ Left     Lt Pressure (mmHg)IndexWaveform  Comment +---------+------------------+-----+----------+-------+ Brachial 131                                      +---------+------------------+-----+----------+-------+ PTA      60                0.41 monophasic        +---------+------------------+-----+----------+-------+ DP       61                0.42 monophasic        +---------+------------------+-----+----------+-------+ Great Toe48                0.33                   +---------+------------------+-----+----------+-------+ +-------+-----------+-----------+------------+------------+ ABI/TBIToday's ABIToday's TBIPrevious ABIPrevious TBI +-------+-----------+-----------+------------+------------+ Right  0.81       0.44       0.61        0.46         +-------+-----------+-----------+------------+------------+ Left   0.42  0.33       0.46        0.31         +-------+-----------+-----------+------------+------------+   Summary: Right: Resting right ankle-brachial index indicates mild right lower extremity arterial disease. The right toe-brachial index is abnormal. Left: Resting left ankle-brachial index indicates severe left lower extremity arterial disease. The left toe-brachial index is abnormal. *See table(s) above for measurements and observations.  Electronically signed by Harold Barban MD on 12/15/2021 at  6:13:29 PM.    Final    ECHOCARDIOGRAM COMPLETE  Result Date: 12/22/2021    ECHOCARDIOGRAM REPORT   Patient Name:   JABRON WEESE Date of Exam: 12/22/2021 Medical Rec #:  [id number]      Height:       63.0 in Accession #:    [id number]     Weight:       112.9 lb Date of Birth:  1951/01/24      BSA:          1.516 m Patient Age:    48 years       BP:           146/70 mmHg Patient Gender: M              HR:           95 bpm. Exam Location:  Inpatient Procedure: 2D Echo, Cardiac Doppler, Color Doppler, Strain Analysis and            Intracardiac Opacification Agent Indications:    AF  History:        Patient has prior history of Echocardiogram examinations, most                 recent 07/09/2015. CAD, COPD; Risk Factors:Dyslipidemia,                 Diabetes and Hypertension.  Sonographer:    Luisa Hart RDCS Referring Phys: 42 RAKESH V ALVA  Sonographer Comments: Technically difficult study due to poor echo windows. Image acquisition challenging due to patient body habitus. IMPRESSIONS  1. Left ventricular ejection fraction, by estimation, is 50 to 55%. The left ventricle has low normal function. The left ventricle demonstrates regional wall motion abnormalities (see scoring diagram/findings for description). Left ventricular diastolic  parameters were normal. There is dyskinesis of the left ventricular, apical apical segment.  2. Right ventricular systolic function was not well visualized. The right ventricular size is normal. There is normal pulmonary artery systolic pressure.  3. The mitral valve is grossly normal. Trivial mitral valve regurgitation. No evidence of mitral stenosis. Moderate mitral annular calcification.  4. The aortic valve was not well visualized. There is mild calcification of the aortic valve. Aortic valve regurgitation is not visualized. Aortic valve sclerosis/calcification is present, without any evidence of aortic stenosis.  5. The inferior vena cava is normal in size with <50%  respiratory variability, suggesting right atrial pressure of 8 mmHg. Comparison(s): Changes from prior study are noted. Apical dyskinesis visualized with echo contrast. FINDINGS  Left Ventricle: Left ventricular ejection fraction, by estimation, is 50 to 55%. The left ventricle has low normal function. The left ventricle demonstrates regional wall motion abnormalities. Definity contrast agent was given IV to delineate the left ventricular endocardial borders. Global longitudinal strain performed but not reported based on interpreter judgement due to suboptimal tracking. The left ventricular internal cavity size was normal in size. There is no left ventricular hypertrophy. Left  ventricular diastolic parameters were normal. Right Ventricle: The  right ventricular size is normal. Right vetricular wall thickness was not well visualized. Right ventricular systolic function was not well visualized. There is normal pulmonary artery systolic pressure. The tricuspid regurgitant velocity is 2.03 m/s, and with an assumed right atrial pressure of 8 mmHg, the estimated right ventricular systolic pressure is 89.3 mmHg. Left Atrium: Left atrial size was normal in size. Right Atrium: Right atrial size was normal in size. Pericardium: There is no evidence of pericardial effusion. Mitral Valve: The mitral valve is grossly normal. Moderate mitral annular calcification. Trivial mitral valve regurgitation. No evidence of mitral valve stenosis. MV peak gradient, 7.3 mmHg. The mean mitral valve gradient is 4.0 mmHg. Tricuspid Valve: The tricuspid valve is not well visualized. Tricuspid valve regurgitation is trivial. No evidence of tricuspid stenosis. Aortic Valve: The aortic valve was not well visualized. There is mild calcification of the aortic valve. Aortic valve regurgitation is not visualized. Aortic valve sclerosis/calcification is present, without any evidence of aortic stenosis. Aortic valve mean gradient measures 5.0 mmHg.  Aortic valve peak gradient measures 8.8 mmHg. Aortic valve area, by VTI measures 2.20 cm. Pulmonic Valve: The pulmonic valve was not well visualized. Pulmonic valve regurgitation is not visualized. No evidence of pulmonic stenosis. Aorta: The aortic root, ascending aorta and aortic arch are all structurally normal, with no evidence of dilitation or obstruction. Venous: The inferior vena cava is normal in size with less than 50% respiratory variability, suggesting right atrial pressure of 8 mmHg. IAS/Shunts: The interatrial septum was not well visualized.  LEFT VENTRICLE PLAX 2D LVIDd:         4.30 cm     Diastology LVIDs:         3.00 cm     LV e' medial:    6.83 cm/s LV PW:         1.00 cm     LV E/e' medial:  11.5 LV IVS:        1.00 cm     LV e' lateral:   9.60 cm/s LVOT diam:     2.20 cm     LV E/e' lateral: 8.2 LV SV:         42 LV SV Index:   28 LVOT Area:     3.80 cm  LV Volumes (MOD) LV vol d, MOD A2C: 25.9 ml LV vol d, MOD A4C: 15.6 ml LV vol s, MOD A2C: 18.4 ml LV vol s, MOD A4C: 5.1 ml LV SV MOD A2C:     7.5 ml LV SV MOD A4C:     15.6 ml LV SV MOD BP:      9.9 ml RIGHT VENTRICLE RV Basal diam:  3.00 cm RV Mid diam:    2.80 cm LEFT ATRIUM             Index        RIGHT ATRIUM          Index LA diam:        2.70 cm 1.78 cm/m   RA Area:     8.48 cm LA Vol (A2C):   23.8 ml 15.70 ml/m  RA Volume:   15.50 ml 10.22 ml/m LA Vol (A4C):   23.8 ml 15.70 ml/m LA Biplane Vol: 24.7 ml 16.29 ml/m  AORTIC VALVE                     PULMONIC VALVE AV Area (Vmax):    2.14 cm      PV Vmax:  0.75 m/s AV Area (Vmean):   2.05 cm      PV Vmean:      62.050 cm/s AV Area (VTI):     2.20 cm      PV VTI:        0.136 m AV Vmax:           148.00 cm/s   PV Peak grad:  2.2 mmHg AV Vmean:          100.000 cm/s  PV Mean grad:  2.0 mmHg AV VTI:            0.192 m AV Peak Grad:      8.8 mmHg AV Mean Grad:      5.0 mmHg LVOT Vmax:         83.20 cm/s LVOT Vmean:        53.900 cm/s LVOT VTI:          0.111 m LVOT/AV VTI ratio:  0.58  AORTA Ao Root diam: 2.80 cm Ao Asc diam:  2.50 cm MITRAL VALVE                TRICUSPID VALVE MV Area (PHT): 7.16 cm     TR Peak grad:   16.5 mmHg MV Area VTI:   1.87 cm     TR Vmax:        203.00 cm/s MV Peak grad:  7.3 mmHg MV Mean grad:  4.0 mmHg     SHUNTS MV Vmax:       1.35 m/s     Systemic VTI:  0.11 m MV Vmean:      88.3 cm/s    Systemic Diam: 2.20 cm MV Decel Time: 106 msec MV E velocity: 78.30 cm/s MV A velocity: 103.00 cm/s MV E/A ratio:  0.76 Buford Dresser MD Electronically signed by Buford Dresser MD Signature Date/Time: 12/22/2021/4:15:10 PM    Final    CT HEAD CODE STROKE WO CONTRAST`  Result Date: 12/26/2021 CLINICAL DATA:  Code stroke. Provided history: Post procedure, possible stroke. EXAM: CT HEAD WITHOUT CONTRAST TECHNIQUE: Contiguous axial images were obtained from the base of the skull through the vertex without intravenous contrast. RADIATION DOSE REDUCTION: This exam was performed according to the departmental dose-optimization program which includes automated exposure control, adjustment of the mA and/or kV according to patient size and/or use of iterative reconstruction technique. COMPARISON:  Noncontrast head CT and CT angiogram head/neck 12/25/2021. Brain MRI 12/21/2021. FINDINGS: The examination is moderately motion degraded, limiting evaluation. Brain: Mild generalized parenchymal atrophy. A known tiny subacute infarct within the posterior left temporal lobe white matter was better appreciated on the recent prior brain MRI of 12/21/2021. Known small chronic cortical infarcts within the left frontal lobe. Background mild patchy and ill-defined hypoattenuation within the cerebral white matter, nonspecific but compatible with chronic small vessel ischemic disease. Small chronic infarct within the right cerebellar hemisphere, better appreciated on the prior MRI. Within the limitations of motion degradation, no acute intracranial hemorrhage, acute demarcated cortical  infarct, extra-axial fluid collection or intracranial mass is identified. No midline shift. Vascular: No hyperdense vessel.  Atherosclerotic calcifications. Skull: No acute fracture or aggressive osseous lesion. Nonspecific chronic thinning of the bilateral parietal calvarium. Sinuses/Orbits: No mass or acute finding within the imaged orbits. Postsurgical appearance of the paranasal sinuses. Mild mucosal thickening within the paranasal sinuses. Additionally, small-volume frothy secretions are present within the bilateral sphenoid and maxillary sinuses. Other: Bilateral mastoid effusions. ASPECTS [facility] Stroke Program Early CT Score) - Ganglionic level  infarction (caudate, lentiform nuclei, internal capsule, insula, M1-M3 cortex): 7 - Supraganglionic infarction (M4-M6 cortex): 3 Total score (0-10 with 10 being normal): 10 No acute intracranial abnormality identified. These results were communicated to Dr. Erlinda Hong At 3:48 pmon 12/26/2021 by text page via the [facility] messaging system. IMPRESSION: Moderately motion degraded exam, limiting evaluation. A known tiny subacute infarct within the posterior left temporal lobe white matter was better appreciated on the recent prior brain MRI of 12/21/2021. Within the limitations of motion degradation, no interval acute intracranial abnormality is identified. Parenchymal atrophy, chronic small vessel ischemic disease and chronic infarcts as described. Paranasal sinus disease. Correlate for acute on chronic sinusitis. Bilateral mastoid effusions. Electronically Signed   By: Kellie Simmering D.O.   On: 12/26/2021 15:49   VAS US CAROTID  Result Date: 12/22/2021 Carotid Arterial Duplex Study Patient Name:  Reginald Ayers  Date of Exam:   12/22/2021 Medical Rec #: [id number]       Accession #:    [id number] Date of Birth: [birth year]       Patient Gender: M Patient Age:   28 years Exam Location:  [facility] Procedure:      VAS US CAROTID Referring Phys: Lesleigh Noe  --------------------------------------------------------------------------------  Indications:       CVA. Risk Factors:      Hypertension, Diabetes, coronary artery disease, PAD. Other Factors:     Atrial fibrillation. Limitations        Today's exam was limited due to patient on a ventilator and                    coughing. Comparison Study:  Prior carotid duplex done 12/9/202 indicated 60-79% right ICA                    stenosis and an occluded left ICA. Performing Technologist: Sharion Dove RVS  Examination Guidelines: A complete evaluation includes B-mode imaging, spectral Doppler, color Doppler, and power Doppler as needed of all accessible portions of each vessel. Bilateral testing is considered an integral part of a complete examination. Limited examinations for reoccurring indications may be performed as noted.  Right Carotid Findings: +----------+--------+--------+--------+----------------------+---------+           PSV cm/sEDV cm/sStenosisPlaque Description    Comments  +----------+--------+--------+--------+----------------------+---------+ CCA Prox  75      14                                              +----------+--------+--------+--------+----------------------+---------+ CCA Distal88      19                                              +----------+--------+--------+--------+----------------------+---------+ ICA Prox  357     106     80-99%  calcific and irregularShadowing +----------+--------+--------+--------+----------------------+---------+ ICA Mid   184     40                                              +----------+--------+--------+--------+----------------------+---------+ ICA Distal133     28                                              +----------+--------+--------+--------+----------------------+---------+  ECA       99      4               calcific                         +----------+--------+--------+--------+----------------------+---------+ +----------+--------+-------+--------+-------------------+           PSV cm/sEDV cmsDescribeArm Pressure (mmHG) +----------+--------+-------+--------+-------------------+ Subclavian111                                        +----------+--------+-------+--------+-------------------+ +---------+--------+--+--------+--+ VertebralPSV cm/s63EDV cm/s20 +---------+--------+--+--------+--+  Left Carotid Findings: +----------+--------+--------+--------+------------------+------------------+           PSV cm/sEDV cm/sStenosisPlaque DescriptionComments           +----------+--------+--------+--------+------------------+------------------+ CCA Prox  107     4                                 intimal thickening +----------+--------+--------+--------+------------------+------------------+ CCA Mid                                             intimal thickening +----------+--------+--------+--------+------------------+------------------+ CCA Distal119     11                                                   +----------+--------+--------+--------+------------------+------------------+ ICA Prox                  Occludedcalcific          known occlusion    +----------+--------+--------+--------+------------------+------------------+ ICA Mid                   Occluded                  known occlusion    +----------+--------+--------+--------+------------------+------------------+ ICA Distal                Occluded                  known occlusion    +----------+--------+--------+--------+------------------+------------------+ ECA       204     20                                                   +----------+--------+--------+--------+------------------+------------------+ +---------+--------+--+--------+--+ VertebralPSV cm/s56EDV cm/s16 +---------+--------+--+--------+--+   Summary:  Right Carotid: Velocities in the right ICA are consistent with a 80-99%                stenosis. Left Carotid: The ICA appears occluded. Vertebrals:  Bilateral vertebral arteries demonstrate antegrade flow. Subclavians: Normal flow hemodynamics were seen in bilateral subclavian              arteries. *See table(s) above for measurements and observations.  Electronically signed by Monica Martinez MD on 12/22/2021 at 4:54:38 PM.    Final    Structural Heart Procedure  Result Date: 12/26/2021 See surgical note for result.  HYBRID OR IMAGING ([facility] ONLY)  Result Date: 12/26/2021 There is no interpretation for this exam.  This order is for images obtained during a surgical procedure.  Please See "Surgeries" Tab for more information regarding the procedure.   CT ANGIO HEAD NECK W WO CM (CODE STROKE)  Result Date: 12/26/2021 CLINICAL DATA:  Stroke, assess intracranial arteries interval right intraparotid artery stent EXAM: CT ANGIOGRAPHY HEAD AND NECK TECHNIQUE: Multidetector CT imaging of the head and neck was performed using the standard protocol during bolus administration of intravenous contrast. Multiplanar CT image reconstructions and MIPs were obtained to evaluate the vascular anatomy. Carotid stenosis measurements (when applicable) are obtained utilizing NASCET criteria, using the distal internal carotid diameter as the denominator. RADIATION DOSE REDUCTION: This exam was performed according to the departmental dose-optimization program which includes automated exposure control, adjustment of the mA and/or kV according to patient size and/or use of iterative reconstruction technique. CONTRAST:  60m OMNIPAQUE IOHEXOL 350 MG/ML SOLN COMPARISON:  Same-day noncontrast head CT, CTA head/neck dated 1 day prior FINDINGS: CTA NECK FINDINGS Aortic arch: There is mild calcified plaque in the imaged aortic arch. The origins of the major branch vessels are patent. The subclavian arteries are patent with scattered plaque  throughout. Right carotid system: The right common carotid artery is patent with soft plaque resulting in less than 50% stenosis. There has been interval stenting of the right internal carotid artery with a stent traversing the bifurcation. There is mild narrowing of the stent at the level of the calcified plaque in the proximal right internal carotid artery but no evidence of persistent/residual hemodynamically significant stenosis. The right external carotid artery branches are patent. There is no evidence of dissection or aneurysm. Left carotid system: The left common carotid artery is patent with soft plaque resulting in less than 50% stenosis. The left internal carotid artery remains occluded from the bifurcation throughout the intracranial segment. The left external carotid artery is patent. Vertebral arteries: The vertebral arteries are patent without hemodynamically significant stenosis or occlusion. There is no dissection or aneurysm. Skeleton: There is mild degenerative change in the cervical spine. There is no acute osseous abnormality or suspicious osseous lesion. There is no visible canal hematoma. Other neck: Soft tissue gas throughout the right neck is consistent with recent procedure. The soft tissues are otherwise unremarkable. Upper chest: There is marked emphysema in the lung apices. Review of the MIP images confirms the above findings CTA HEAD FINDINGS Anterior circulation: There is calcified plaque in the right intracranial ICA resulting in up to mild stenosis, unchanged. The left internal carotid artery remains occluded from the skull base through the ophthalmic segment where there is reconstitution of flow, though there is calcified plaque within the reconstituted segment resulting in moderate stenosis of the paraclinoid segment, unchanged. The bilateral MCAs are patent. The bilateral ACAs are patent. The anterior communicating artery is normal. There is no aneurysm or AVM. Posterior  circulation: The bilateral V4 segments are patent with minimal plaque on the left. The PICA origin is seen on the right but not definitely seen on the left. The basilar artery is patent. The bilateral PCAs are patent. The posterior communicating arteries are not seen. There is no aneurysm or AVM. Venous sinuses: Heterogeneous enhancement in the right transverse sinus is increased in conspicuity since the prior study. Anatomic variants: None. Review of the MIP images confirms the above findings IMPRESSION: 1. Interval revascularization and stenting of the right common/internal carotid arteries. The stent is patient with mild residual narrowing at the bifurcation at  the site of calcified plaque but no hemodynamically significant stenosis. The distal right internal carotid artery is widely patent in the neck. 2. 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 3. Otherwise, patent intracranial vasculature with no proximal high-grade stenosis or occlusion. 4. Heterogeneous enhancement of the right transverse sinus is increased in conspicuity since the prior study. While this may reflect mixing artifact related to bolus timing, findings concerning for venous thrombosis. Recommend MRV of the head with and without contrast for further evaluation. Aortic Atherosclerosis (ICD10-I70.0) and Emphysema (ICD10-J43.9). Electronically Signed   By: Valetta Mole M.D.   On: 12/26/2021 16:10     Discharge Exam: Vitals:   01/12/22 0423 01/12/22 0827  BP: (!) 125/58 (!) 114/58  Pulse: 75 83  Resp: 17   Temp: (!) 97.2 F (36.2 C)   SpO2: 98%    Vitals:   01/12/22 0000 01/12/22 0028 01/12/22 0423 01/12/22 0827  BP: 117/63  (!) 125/58 (!) 114/58  Pulse: 72  75 83  Resp: 19  17   Temp: 97.8 F (36.6 C)  (!) 97.2 F (36.2 C)   TempSrc: Oral  Oral   SpO2: 96% 100% 98%   Weight:   46.6 kg    Height:        General: Pt is alert, awake, not in acute distress Cardiovascular: RRR, S1/S2 +, no rubs, no gallops Respiratory: Rhonchi in the right middle and lower lobe. Abdominal: Soft, NT, ND, bowel sounds + Extremities: no edema, no cyanosis    The results of significant diagnostics from this hospitalization (including imaging, microbiology, ancillary and laboratory) are listed below for reference.     Microbiology: Recent Results (from the past 240 hour(s))  Culture, blood (routine x 2)     Status: None (Preliminary result)   Collection Time: 01/10/22  1:28 PM   Specimen: BLOOD  Result Value Ref Range Status   Specimen Description BLOOD LEFT ANTECUBITAL  Final   Special Requests   Final    BOTTLES DRAWN AEROBIC AND ANAEROBIC Blood Culture adequate volume   Culture   Final    NO GROWTH 2 DAYS Performed at [facility], [address] [place], [place] [address]    Report Status PENDING  Incomplete  Resp Panel by RT-PCR (Flu A&B, Covid) Nasopharyngeal Swab     Status: None   Collection Time: 01/10/22  1:28 PM   Specimen: Nasopharyngeal Swab; Nasopharyngeal(NP) swabs in vial transport medium  Result Value Ref Range Status   SARS Coronavirus 2 by RT PCR NEGATIVE NEGATIVE Final    Comment: (NOTE) SARS-CoV-2 target nucleic acids are NOT DETECTED.  The SARS-CoV-2 RNA is generally detectable in upper respiratory specimens during the acute phase of infection. The lowest concentration of SARS-CoV-2 viral copies this assay can detect is 138 copies/mL. A negative result does not preclude SARS-Cov-2 infection and should not be used as the sole basis for treatment or other patient management decisions. A negative result may occur with  improper specimen collection/handling, submission of specimen other than nasopharyngeal swab, presence of viral mutation(s) within the areas targeted by this assay, and inadequate number of viral copies(<138 copies/mL). A negative result  must be combined with clinical observations, patient history, and epidemiological information. The expected result is Negative.  Fact Sheet for Patients:  EntrepreneurPulse.com.au  Fact Sheet for Healthcare Providers:  IncredibleEmployment.be  This test is no t yet approved or  cleared by the [place] and  has been authorized for detection and/or diagnosis of SARS-CoV-2 by FDA under an Emergency Use Authorization (EUA). This EUA will remain  in effect (meaning this test can be used) for the duration of the COVID-19 declaration under Section 564(b)(1) of the Act, 21 U.S.C.section 360bbb-3(b)(1), unless the authorization is terminated  or revoked sooner.       Influenza A by PCR NEGATIVE NEGATIVE Final   Influenza B by PCR NEGATIVE NEGATIVE Final    Comment: (NOTE) The Xpert Xpress SARS-CoV-2/FLU/RSV plus assay is intended as an aid in the diagnosis of influenza from Nasopharyngeal swab specimens and should not be used as a sole basis for treatment. Nasal washings and aspirates are unacceptable for Xpert Xpress SARS-CoV-2/FLU/RSV testing.  Fact Sheet for Patients: EntrepreneurPulse.com.au  Fact Sheet for Healthcare Providers: IncredibleEmployment.be  This test is not yet approved or cleared by the [place] FDA and has been authorized for detection and/or diagnosis of SARS-CoV-2 by FDA under an Emergency Use Authorization (EUA). This EUA will remain in effect (meaning this test can be used) for the duration of the COVID-19 declaration under Section 564(b)(1) of the Act, 21 U.S.C. section 360bbb-3(b)(1), unless the authorization is terminated or revoked.  Performed at [facility], [place] [address], [place], [place] [address]   Culture, blood (routine x 2)     Status: None (Preliminary result)   Collection Time: 01/10/22  1:33 PM   Specimen: BLOOD  Result Value Ref Range Status    Specimen Description BLOOD RIGHT ANTECUBITAL  Final   Special Requests   Final    BOTTLES DRAWN AEROBIC AND ANAEROBIC Blood Culture adequate volume   Culture   Final    NO GROWTH 2 DAYS Performed at [facility], [place] [address] [place], [place] [address]    Report Status PENDING  Incomplete  MRSA Next Gen by PCR, Nasal     Status: Abnormal   Collection Time: 01/10/22  3:30 PM   Specimen: Nasal Mucosa; Nasal Swab  Result Value Ref Range Status   MRSA by PCR Next Gen DETECTED (A) NOT DETECTED Final    Comment: RESULT CALLED TO, READ BACK BY AND VERIFIED WITH: M,FUTRELL RN '@[email or web address]'  01/10/22 EB (NOTE) The GeneXpert MRSA Assay (FDA approved for NASAL specimens only), is one component of a comprehensive MRSA colonization surveillance program. It is not intended to diagnose MRSA infection nor to guide or monitor treatment for MRSA infections. Test performance is not FDA approved in patients less than 9 years old. Performed at [facility], [place] [address], [place], [place] [address]      Labs: BNP (last 3 results) Recent Labs    12/29/21 0101 12/30/21 0330 01/10/22 1400  BNP 493.9* 345.1* 202.5*   Basic Metabolic Panel: Recent Labs  Lab 01/10/22 1348 01/10/22 1416 01/11/22 0258 01/12/22 0205  NA 131* 131* 134* 132*  K 4.2 4.2 3.9 4.1  CL 98  --  101 98  CO2 23  --  23 22  GLUCOSE 304*  --  207* 243*  BUN 25*  --  26* 40*  CREATININE 1.79*  --  1.55* 1.70*  CALCIUM 9.0  --  9.2 9.1  MG  --   --   --  2.0   Liver Function Tests: Recent Labs  Lab 01/10/22 1348  AST 18  ALT 18  ALKPHOS 84  BILITOT 0.5  PROT 6.7  ALBUMIN 2.5*   No results for  input(s): LIPASE, AMYLASE in the last 168 hours. No results for input(s): AMMONIA in the last 168 hours. CBC: Recent Labs  Lab 01/10/22 1348 01/10/22 1416 01/11/22 0258 01/12/22 0205  WBC 14.0*  --  14.0* 17.1*  NEUTROABS 10.3*  --   --  14.7*  HGB 9.2* 10.5* 8.9* 8.6*  HCT 30.4* 31.0* 27.9* 26.3*  MCV  96.5  --  92.7 91.6  PLT 416*  --  383 363   Cardiac Enzymes: No results for input(s): CKTOTAL, CKMB, CKMBINDEX, TROPONINI in the last 168 hours. BNP: Invalid input(s): POCBNP CBG: Recent Labs  Lab 01/11/22 0512 01/11/22 1100 01/11/22 1552 01/11/22 2110 01/12/22 0612  GLUCAP 217* 325* 381* 288* 243*   D-Dimer No results for input(s): DDIMER in the last 72 hours. Hgb A1c No results for input(s): HGBA1C in the last 72 hours. Lipid Profile No results for input(s): CHOL, HDL, LDLCALC, TRIG, CHOLHDL, LDLDIRECT in the last 72 hours. Thyroid function studies No results for input(s): TSH, T4TOTAL, T3FREE, THYROIDAB in the last 72 hours.  Invalid input(s): FREET3 Anemia work up No results for input(s): VITAMINB12, FOLATE, FERRITIN, TIBC, IRON, RETICCTPCT in the last 72 hours. Urinalysis    Component Value Date/Time   COLORURINE YELLOW 12/20/2021 1211   APPEARANCEUR CLEAR 12/20/2021 1211   LABSPEC 1.015 12/20/2021 1211   PHURINE 6.0 12/20/2021 1211   GLUCOSEU >=500 (A) 12/20/2021 1211   HGBUR SMALL (A) 12/20/2021 1211   BILIRUBINUR NEGATIVE 12/20/2021 1211   KETONESUR NEGATIVE 12/20/2021 1211   PROTEINUR 30 (A) 12/20/2021 1211   UROBILINOGEN 0.2 07/07/2015 1842   NITRITE NEGATIVE 12/20/2021 1211   LEUKOCYTESUR NEGATIVE 12/20/2021 1211   Sepsis Labs Invalid input(s): PROCALCITONIN,  WBC,  LACTICIDVEN Microbiology Recent Results (from the past 240 hour(s))  Culture, blood (routine x 2)     Status: None (Preliminary result)   Collection Time: 01/10/22  1:28 PM   Specimen: BLOOD  Result Value Ref Range Status   Specimen Description BLOOD LEFT ANTECUBITAL  Final   Special Requests   Final    BOTTLES DRAWN AEROBIC AND ANAEROBIC Blood Culture adequate volume   Culture   Final    NO GROWTH 2 DAYS Performed at [facility], [place] [address] [place], [place] [address]    Report Status PENDING  Incomplete  Resp Panel by RT-PCR (Flu A&B, Covid) Nasopharyngeal Swab      Status: None   Collection Time: 01/10/22  1:28 PM   Specimen: Nasopharyngeal Swab; Nasopharyngeal(NP) swabs in vial transport medium  Result Value Ref Range Status   SARS Coronavirus 2 by RT PCR NEGATIVE NEGATIVE Final    Comment: (NOTE) SARS-CoV-2 target nucleic acids are NOT DETECTED.  The SARS-CoV-2 RNA is generally detectable in upper respiratory specimens during the acute phase of infection. The lowest concentration of SARS-CoV-2 viral copies this assay can detect is 138 copies/mL. A negative result does not preclude SARS-Cov-2 infection and should not be used as the sole basis for treatment or other patient management decisions. A negative result may occur with  improper specimen collection/handling, submission of specimen other than nasopharyngeal swab, presence of viral mutation(s) within the areas targeted by this assay, and inadequate number of viral copies(<138 copies/mL). A negative result must be combined with clinical observations, patient history, and epidemiological information. The expected result is Negative.  Fact Sheet for Patients:  EntrepreneurPulse.com.au  Fact Sheet for Healthcare Providers:  IncredibleEmployment.be  This test is no t yet approved or cleared by the [place] FDA  and  has been authorized for detection and/or diagnosis of SARS-CoV-2 by FDA under an Emergency Use Authorization (EUA). This EUA will remain  in effect (meaning this test can be used) for the duration of the COVID-19 declaration under Section 564(b)(1) of the Act, 21 U.S.C.section 360bbb-3(b)(1), unless the authorization is terminated  or revoked sooner.       Influenza A by PCR NEGATIVE NEGATIVE Final   Influenza B by PCR NEGATIVE NEGATIVE Final    Comment: (NOTE) The Xpert Xpress SARS-CoV-2/FLU/RSV plus assay is intended as an aid in the diagnosis of influenza from Nasopharyngeal swab specimens and should not be used as a sole basis  for treatment. Nasal washings and aspirates are unacceptable for Xpert Xpress SARS-CoV-2/FLU/RSV testing.  Fact Sheet for Patients: EntrepreneurPulse.com.au  Fact Sheet for Healthcare Providers: IncredibleEmployment.be  This test is not yet approved or cleared by the [place] FDA and has been authorized for detection and/or diagnosis of SARS-CoV-2 by FDA under an Emergency Use Authorization (EUA). This EUA will remain in effect (meaning this test can be used) for the duration of the COVID-19 declaration under Section 564(b)(1) of the Act, 21 U.S.C. section 360bbb-3(b)(1), unless the authorization is terminated or revoked.  Performed at [facility], [place] [address], [place], [place] [address]   Culture, blood (routine x 2)     Status: None (Preliminary result)   Collection Time: 01/10/22  1:33 PM   Specimen: BLOOD  Result Value Ref Range Status   Specimen Description BLOOD RIGHT ANTECUBITAL  Final   Special Requests   Final    BOTTLES DRAWN AEROBIC AND ANAEROBIC Blood Culture adequate volume   Culture   Final    NO GROWTH 2 DAYS Performed at [facility], [place] [address], [place], [place] [address]    Report Status PENDING  Incomplete  MRSA Next Gen by PCR, Nasal     Status: Abnormal   Collection Time: 01/10/22  3:30 PM   Specimen: Nasal Mucosa; Nasal Swab  Result Value Ref Range Status   MRSA by PCR Next Gen DETECTED (A) NOT DETECTED Final    Comment: RESULT CALLED TO, READ BACK BY AND VERIFIED WITH: M,FUTRELL RN '@[email or web address]'  01/10/22 EB (NOTE) The GeneXpert MRSA Assay (FDA approved for NASAL specimens only), is one component of a comprehensive MRSA colonization surveillance program. It is not intended to diagnose MRSA infection nor to guide or monitor treatment for MRSA infections. Test performance is not FDA approved in patients less than 44 years old. Performed at [facility], [place] [address], [place],  [place] [address]      Time coordinating discharge: Over 30 minutes  SIGNED:   Darliss Cheney, MD  [facility] 01/12/2022, 11:38 AM *Please note that this is a verbal dictation therefore any spelling or grammatical errors are due to the "[place] [facility]" system interpretation. If 7PM-7AM, please contact night-coverage [email or web address]

## 2022-01-12 NOTE — Plan of Care (Signed)
  Problem: Clinical Measurements: Goal: Will remain free from infection Outcome: Completed/Met

## 2022-01-12 NOTE — Progress Notes (Signed)
    Durable Medical Equipment  (From admission, onward)           Start     Ordered   01/12/22 1014  For home use only DME Shower stool  Once        01/12/22 1013   01/12/22 1013  For home use only DME lightweight manual wheelchair with seat cushion  Once       Comments: Patient suffers from weakness which impairs their ability to perform daily activities like bathing, dressing, grooming, and toileting in the home.  A cane or walker will not resolve  issue with performing activities of daily living. A wheelchair will allow patient to safely perform daily activities. Patient is not able to propel themselves in the home using a standard weight wheelchair due to general weakness. Patient can self propel in the lightweight wheelchair. Length of need Lifetime. Accessories: elevating leg rests (ELRs), wheel locks, extensions and anti-tippers.   01/12/22 1013

## 2022-01-12 NOTE — TOC Initial Note (Signed)
Transition of Care [facility]) - Initial/Assessment Note    Patient Details  Name: Reginald Ayers MRN: [id number] Date of Birth: 18-Jun-1951  [facility] CM/SW Contact:    Zenon Mayo, RN Phone Number: 01/12/2022, 10:37 AM  Clinical Narrative:                 From home with niece and room mate, he has home oxygen 2 liters, niece will bring tank with her when she transports him home today.  He is active with [facility] for [place], and would like to continue with them.  NCM notified [facility] of dc today.  He will need light weight w/chair and shower chair, [employer] will bring this to the room prior to dc.  He is on oxygen also.   Expected Discharge Plan: [place] Barriers to Discharge: No Barriers Identified   Patient Goals and CMS Choice Patient states their goals for this hospitalization and ongoing recovery are:: home with neice with [facility] CMS Medicare.gov Compare Post Acute Care list provided to:: Patient Choice offered to / list presented to : Patient  Expected Discharge Plan and Services Expected Discharge Plan: [place] In-house Referral: NA Discharge Planning Services: CM Consult Post Acute Care Choice: [place] arrangements for the past 2 months: Single Family Home Expected Discharge Date: 01/12/22               DME Arranged: High strength lightweight manual wheelchair with seat cushion, Shower stool DME Agency: [facility] Date DME Agency Contacted: 01/12/22 Time DME Agency Contacted: [phone] Representative spoke with at DME Agency: [employer] rep HH Arranged: RN, Disease Management, PT, OT HH Agency: [place] Date [place]: 01/12/22 Time [place]: 35 Representative spoke with at [place]: [facility] rep  Prior Living Arrangements/Services Living arrangements for the past 2 months: [place] with:: Roommate, Relatives Patient language and need for interpreter  reviewed:: Yes Do you feel safe going back to the place where you live?: Yes      Need for Family Participation in Patient Care: Yes (Comment) Care giver support system in place?: Yes (comment) Current home services: DME, Home OT, Home PT, Home RN (home oxygen 2 liters, active with [facility] for [facility]) Criminal Activity/Legal Involvement Pertinent to Current Situation/Hospitalization: No - Comment as needed  Activities of Daily Living Home Assistive Devices/Equipment: Walker (specify type) ADL Screening (condition at time of admission) Patient's cognitive ability adequate to safely complete daily activities?: Yes Is the patient deaf or have difficulty hearing?: Yes Does the patient have difficulty seeing, even when wearing glasses/contacts?: No Does the patient have difficulty concentrating, remembering, or making decisions?: No Patient able to express need for assistance with ADLs?: Yes Does the patient have difficulty dressing or bathing?: Yes Independently performs ADLs?: No Communication: Independent Is this a change from baseline?: Pre-admission baseline Dressing (OT): Needs assistance Is this a change from baseline?: Pre-admission baseline Grooming: Needs assistance Is this a change from baseline?: Pre-admission baseline Feeding: Independent Is this a change from baseline?: Pre-admission baseline Bathing: Needs assistance Is this a change from baseline?: Pre-admission baseline Toileting: Needs assistance Is this a change from baseline?: Pre-admission baseline In/Out Bed: Needs assistance Is this a change from baseline?: Pre-admission baseline Walks in Home: Needs assistance Is this a change from baseline?: Pre-admission baseline Does the patient have difficulty walking or climbing stairs?: Yes Weakness of Legs: Both Weakness of Arms/Hands: Both  Permission Sought/Granted  Emotional Assessment Appearance:: Appears stated  age Attitude/Demeanor/Rapport: Engaged Affect (typically observed): Appropriate Orientation: : Oriented to Self, Oriented to Place, Oriented to  Time, Oriented to Situation Alcohol / Substance Use: Not Applicable Psych Involvement: No (comment)  Admission diagnosis:  Shortness of breath [R06.02] HCAP (healthcare-associated pneumonia) [J18.9] Patient Active Problem List   Diagnosis Date Noted   Cerebral embolism with cerebral infarction 12/22/2021   Pressure injury of skin 12/21/2021   Acute respiratory failure with hypoxia ([place]) 12/20/2021   Severe protein-energy malnutrition ([place]) 12/20/2021   Acidosis, metabolic 0000000   AKI (acute kidney injury) ([place]) 09/27/2021   Stage 2 chronic kidney disease 08/14/2019   HCAP (healthcare-associated pneumonia) 08/12/2017   GERD (gastroesophageal reflux disease) 07/30/2015   Tobacco abuse 12/06/2014   COPD (chronic obstructive pulmonary disease) (HCC)    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, [place] [address] Phone: [phone] Fax: [phone]  [facility] [address] [place] [address] Phone: [phone] Fax: [phone]     Social Determinants of Health (SDOH) Interventions    Readmission Risk Interventions    01/12/2022   10:29 AM  Readmission Risk Prevention Plan  Transportation Screening Complete  PCP or Specialist Appt within 3-5 Days Complete  HRI or [place] Complete  Social Work Consult for [place] Planning/Counseling Complete  Palliative Care Screening Not Applicable  Medication Review Press photographer) Complete

## 2022-01-12 NOTE — Discharge Instructions (Signed)

## 2022-01-14 DIAGNOSIS — I129 Hypertensive chronic kidney disease with stage 1 through stage 4 chronic kidney disease, or unspecified chronic kidney disease: Secondary | ICD-10-CM | POA: Diagnosis not present

## 2022-01-14 DIAGNOSIS — E1151 Type 2 diabetes mellitus with diabetic peripheral angiopathy without gangrene: Secondary | ICD-10-CM | POA: Diagnosis not present

## 2022-01-14 DIAGNOSIS — I2510 Atherosclerotic heart disease of native coronary artery without angina pectoris: Secondary | ICD-10-CM | POA: Diagnosis not present

## 2022-01-14 DIAGNOSIS — E1122 Type 2 diabetes mellitus with diabetic chronic kidney disease: Secondary | ICD-10-CM | POA: Diagnosis not present

## 2022-01-14 DIAGNOSIS — Z48812 Encounter for surgical aftercare following surgery on the circulatory system: Secondary | ICD-10-CM | POA: Diagnosis not present

## 2022-01-14 DIAGNOSIS — I739 Peripheral vascular disease, unspecified: Secondary | ICD-10-CM | POA: Diagnosis not present

## 2022-01-15 ENCOUNTER — Other Ambulatory Visit (HOSPITAL_COMMUNITY): Payer: Self-pay

## 2022-01-15 ENCOUNTER — Telehealth (HOSPITAL_COMMUNITY): Payer: Self-pay

## 2022-01-15 LAB — CULTURE, BLOOD (ROUTINE X 2)
Culture: NO GROWTH
Culture: NO GROWTH
Special Requests: ADEQUATE
Special Requests: ADEQUATE

## 2022-01-15 NOTE — Telephone Encounter (Signed)
Transitions of Care Pharmacy  ° °Call attempted for a pharmacy transitions of care follow-up. HIPAA appropriate voicemail was left with call back information provided.  ° °Call attempt #1. Will follow-up in 2-3 days.  °  °

## 2022-01-20 DIAGNOSIS — I129 Hypertensive chronic kidney disease with stage 1 through stage 4 chronic kidney disease, or unspecified chronic kidney disease: Secondary | ICD-10-CM | POA: Diagnosis not present

## 2022-01-20 DIAGNOSIS — E1151 Type 2 diabetes mellitus with diabetic peripheral angiopathy without gangrene: Secondary | ICD-10-CM | POA: Diagnosis not present

## 2022-01-20 DIAGNOSIS — Z48812 Encounter for surgical aftercare following surgery on the circulatory system: Secondary | ICD-10-CM | POA: Diagnosis not present

## 2022-01-20 DIAGNOSIS — E1122 Type 2 diabetes mellitus with diabetic chronic kidney disease: Secondary | ICD-10-CM | POA: Diagnosis not present

## 2022-01-20 DIAGNOSIS — I2510 Atherosclerotic heart disease of native coronary artery without angina pectoris: Secondary | ICD-10-CM | POA: Diagnosis not present

## 2022-01-20 DIAGNOSIS — I739 Peripheral vascular disease, unspecified: Secondary | ICD-10-CM | POA: Diagnosis not present

## 2022-01-21 ENCOUNTER — Other Ambulatory Visit (HOSPITAL_COMMUNITY): Payer: Self-pay

## 2022-01-21 ENCOUNTER — Telehealth (HOSPITAL_COMMUNITY): Payer: Self-pay

## 2022-01-21 DIAGNOSIS — I2510 Atherosclerotic heart disease of native coronary artery without angina pectoris: Secondary | ICD-10-CM | POA: Diagnosis not present

## 2022-01-21 DIAGNOSIS — E1122 Type 2 diabetes mellitus with diabetic chronic kidney disease: Secondary | ICD-10-CM | POA: Diagnosis not present

## 2022-01-21 DIAGNOSIS — I739 Peripheral vascular disease, unspecified: Secondary | ICD-10-CM | POA: Diagnosis not present

## 2022-01-21 DIAGNOSIS — I129 Hypertensive chronic kidney disease with stage 1 through stage 4 chronic kidney disease, or unspecified chronic kidney disease: Secondary | ICD-10-CM | POA: Diagnosis not present

## 2022-01-21 DIAGNOSIS — Z48812 Encounter for surgical aftercare following surgery on the circulatory system: Secondary | ICD-10-CM | POA: Diagnosis not present

## 2022-01-21 DIAGNOSIS — E1151 Type 2 diabetes mellitus with diabetic peripheral angiopathy without gangrene: Secondary | ICD-10-CM | POA: Diagnosis not present

## 2022-01-21 NOTE — Telephone Encounter (Signed)
Pharmacy Transitions of Care Follow-up Telephone Call  Date of discharge: 12/31/21  Discharge Diagnosis: Stroke  How have you been since you were released from the hospital? Patient's niece reports doing well.    Medication changes made at discharge: START taking: Accu-Chek Guide  Aspirin Low Dose (aspirin EC)  Basaglar KwikPen  Eliquis (apixaban)  insulin aspart (NOVOLOG)  pantoprazole (Protonix)  Pentips (Insulin Pen Needle)  rosuvastatin (CRESTOR)  STOP taking: ibuprofen 200 MG tablet (ADVIL)  losartan 50 MG tablet (COZAAR)  traMADol 50 MG tablet (ULTRAM)   Medication changes verified by the patient? Yes    Medication Accessibility:  Home Pharmacy: [facility] -- [address]   Was the patient provided with refills on discharged medications? Yes   Have all prescriptions been transferred from [facility] to home pharmacy? Yes   Is the patient able to afford medications? Yes  Medication Review:   APIXABAN (ELIQUIS)  Apixaban 2.5 mg BID.  - Discussed importance of taking medication around the same time everyday  - Reviewed potential DDIs with patient  - Advised patient of medications to avoid (NSAIDs, ASA)  - Educated that Tylenol (acetaminophen) will be the preferred analgesic to prevent risk of bleeding  - Emphasized importance of monitoring for signs and symptoms of bleeding (abnormal bruising, prolonged bleeding, nose bleeds, bleeding from gums, discolored urine, black tarry stools)  - Advised patient to alert all providers of anticoagulation therapy prior to starting a new medication or having a procedure   Follow-up Appointments:  [facility] f/u appt confirmed?  Scheduled to see  on Serafina Mitchell, MD @ 01/26/22.   Final Patient Assessment: Patient doing well. Discussed medications and transfers with patient's niece. All transfers sent.

## 2022-01-21 NOTE — Telephone Encounter (Signed)
Transitions of Care Pharmacy   Call attempted for a pharmacy transitions of care follow-up.   Call attempt #2. Will follow-up in 2-3 days.

## 2022-01-22 DIAGNOSIS — R1310 Dysphagia, unspecified: Secondary | ICD-10-CM | POA: Diagnosis not present

## 2022-01-22 DIAGNOSIS — I480 Paroxysmal atrial fibrillation: Secondary | ICD-10-CM | POA: Diagnosis not present

## 2022-01-22 DIAGNOSIS — E1151 Type 2 diabetes mellitus with diabetic peripheral angiopathy without gangrene: Secondary | ICD-10-CM | POA: Diagnosis not present

## 2022-01-22 DIAGNOSIS — J189 Pneumonia, unspecified organism: Secondary | ICD-10-CM | POA: Diagnosis not present

## 2022-01-22 DIAGNOSIS — Z48812 Encounter for surgical aftercare following surgery on the circulatory system: Secondary | ICD-10-CM | POA: Diagnosis not present

## 2022-01-22 DIAGNOSIS — I639 Cerebral infarction, unspecified: Secondary | ICD-10-CM | POA: Diagnosis not present

## 2022-01-22 DIAGNOSIS — I129 Hypertensive chronic kidney disease with stage 1 through stage 4 chronic kidney disease, or unspecified chronic kidney disease: Secondary | ICD-10-CM | POA: Diagnosis not present

## 2022-01-22 DIAGNOSIS — I739 Peripheral vascular disease, unspecified: Secondary | ICD-10-CM | POA: Diagnosis not present

## 2022-01-22 DIAGNOSIS — I2510 Atherosclerotic heart disease of native coronary artery without angina pectoris: Secondary | ICD-10-CM | POA: Diagnosis not present

## 2022-01-22 DIAGNOSIS — J9610 Chronic respiratory failure, unspecified whether with hypoxia or hypercapnia: Secondary | ICD-10-CM | POA: Diagnosis not present

## 2022-01-22 DIAGNOSIS — E1122 Type 2 diabetes mellitus with diabetic chronic kidney disease: Secondary | ICD-10-CM | POA: Diagnosis not present

## 2022-01-23 DIAGNOSIS — E1151 Type 2 diabetes mellitus with diabetic peripheral angiopathy without gangrene: Secondary | ICD-10-CM | POA: Diagnosis not present

## 2022-01-23 DIAGNOSIS — I739 Peripheral vascular disease, unspecified: Secondary | ICD-10-CM | POA: Diagnosis not present

## 2022-01-23 DIAGNOSIS — I2510 Atherosclerotic heart disease of native coronary artery without angina pectoris: Secondary | ICD-10-CM | POA: Diagnosis not present

## 2022-01-23 DIAGNOSIS — Z48812 Encounter for surgical aftercare following surgery on the circulatory system: Secondary | ICD-10-CM | POA: Diagnosis not present

## 2022-01-23 DIAGNOSIS — E1122 Type 2 diabetes mellitus with diabetic chronic kidney disease: Secondary | ICD-10-CM | POA: Diagnosis not present

## 2022-01-23 DIAGNOSIS — I129 Hypertensive chronic kidney disease with stage 1 through stage 4 chronic kidney disease, or unspecified chronic kidney disease: Secondary | ICD-10-CM | POA: Diagnosis not present

## 2022-01-26 ENCOUNTER — Encounter (HOSPITAL_COMMUNITY): Payer: Medicare Other

## 2022-01-26 ENCOUNTER — Encounter: Payer: Medicare Other | Admitting: Surgery

## 2022-01-26 DIAGNOSIS — Z48812 Encounter for surgical aftercare following surgery on the circulatory system: Secondary | ICD-10-CM | POA: Diagnosis not present

## 2022-01-26 DIAGNOSIS — I2510 Atherosclerotic heart disease of native coronary artery without angina pectoris: Secondary | ICD-10-CM | POA: Diagnosis not present

## 2022-01-26 DIAGNOSIS — I739 Peripheral vascular disease, unspecified: Secondary | ICD-10-CM | POA: Diagnosis not present

## 2022-01-26 DIAGNOSIS — E1122 Type 2 diabetes mellitus with diabetic chronic kidney disease: Secondary | ICD-10-CM | POA: Diagnosis not present

## 2022-01-26 DIAGNOSIS — E1151 Type 2 diabetes mellitus with diabetic peripheral angiopathy without gangrene: Secondary | ICD-10-CM | POA: Diagnosis not present

## 2022-01-26 DIAGNOSIS — I129 Hypertensive chronic kidney disease with stage 1 through stage 4 chronic kidney disease, or unspecified chronic kidney disease: Secondary | ICD-10-CM | POA: Diagnosis not present

## 2022-01-27 ENCOUNTER — Inpatient Hospital Stay: Payer: Medicare Other | Admitting: Infectious Diseases

## 2022-01-28 ENCOUNTER — Other Ambulatory Visit: Payer: Self-pay

## 2022-01-28 ENCOUNTER — Encounter: Payer: Self-pay | Admitting: Infectious Diseases

## 2022-01-28 ENCOUNTER — Other Ambulatory Visit: Payer: Self-pay | Admitting: Pharmacist

## 2022-01-28 ENCOUNTER — Ambulatory Visit (INDEPENDENT_AMBULATORY_CARE_PROVIDER_SITE_OTHER): Payer: Medicare Other | Admitting: Infectious Diseases

## 2022-01-28 VITALS — BP 143/78 | HR 97 | Temp 98.3°F

## 2022-01-28 DIAGNOSIS — J189 Pneumonia, unspecified organism: Secondary | ICD-10-CM | POA: Diagnosis not present

## 2022-01-28 DIAGNOSIS — Z129 Encounter for screening for malignant neoplasm, site unspecified: Secondary | ICD-10-CM | POA: Diagnosis not present

## 2022-01-28 DIAGNOSIS — Z5181 Encounter for therapeutic drug level monitoring: Secondary | ICD-10-CM

## 2022-01-28 MED ORDER — APIXABAN 2.5 MG PO TABS: 2.5000 mg | ORAL_TABLET | Freq: Two times a day (BID) | ORAL | 1 refills | Status: DC

## 2022-01-28 NOTE — Progress Notes (Unsigned)
Received message from inpatient team about pt assistance for Eliquis. Pt has Nurse, learning disability and can just use a copay card. Only had 1 month rx sent to [facility] pharmacy at time of discharge. I have sent in subsequent rx to [facility], activated copay card, and sent this to his pharmacy as well. BIN  [id number], GRP [id number], PCN [id number], [place] [id number].

## 2022-01-28 NOTE — Progress Notes (Addendum)
Patient Active Problem List   Diagnosis Date Noted   Cerebral embolism with cerebral infarction 12/22/2021   Pressure injury of skin 12/21/2021   Acute respiratory failure with hypoxia ([place]) 12/20/2021   Severe protein-energy malnutrition (HCC) 12/20/2021   Acidosis, metabolic 88/89/1694   AKI (acute kidney injury) ([place]) 09/27/2021   Stage 2 chronic kidney disease 08/14/2019   HCAP (healthcare-associated pneumonia) 08/12/2017   GERD (gastroesophageal reflux disease) 07/30/2015   Tobacco abuse 12/06/2014   COPD (chronic obstructive pulmonary disease) ([place])    Hx of CABG Feb 2010 08/21/2013   Peripheral arterial disease ([place]) 08/21/2013   Carotid artery disease ([place]) 08/21/2013   Diabetes ([place]) 08/21/2013   Essential hypertension 08/21/2013   Hyperlipidemia 08/21/2013    Patient's Medications  New Prescriptions   No medications on file  Previous Medications   ALBUTEROL (VENTOLIN HFA) 108 (90 BASE) MCG/ACT INHALER    Inhale 2 puffs into the lungs every 4 (four) hours as needed.   APIXABAN (ELIQUIS) 2.5 MG TABS TABLET    Take 1 tablet (2.5 mg total) by mouth 2 (two) times daily.   ASPIRIN 81 MG EC TABLET    Take 1 tablet (81 mg total) by mouth daily. Swallow whole.   BLOOD GLUCOSE MONITORING SUPPL (BLOOD GLUCOSE MONITOR SYSTEM) W/DEVICE KIT    Used to test blood sugars 4 times daily   CLOPIDOGREL (PLAVIX) 75 MG TABLET    Take 75 mg by mouth daily.    CYANOCOBALAMIN (VITAMIN B-12 PO)    Take 1 tablet by mouth daily.   EMPAGLIFLOZIN (JARDIANCE) 25 MG TABS TABLET    Take 25 mg by mouth daily.   FUROSEMIDE (LASIX) 20 MG TABLET    Take 20 mg by mouth daily.   INSULIN ASPART (NOVOLOG) 100 UNIT/ML FLEXPEN    Before each meal 3 times a day, 140-199 - 2 units, 200-250 - 4 units, 251-299 - 6 units,  300-349 - 8 units,  350 or above 10 units.  Insulin PEN if approved, provide syringes and needles if needed.   INSULIN GLARGINE (BASAGLAR KWIKPEN) 100 UNIT/ML    Inject 15 Units into the  skin daily.   INSULIN PEN NEEDLE 32G X 4 MM MISC    Use to inject insulin 4 times daily.   METFORMIN (GLUCOPHAGE) 500 MG TABLET    Take 1,000 mg by mouth 2 (two) times daily with a meal.    METOPROLOL SUCCINATE (TOPROL-XL) 25 MG 24 HR TABLET    TAKE 1 TABLET(25 MG) BY MOUTH DAILY   MULTIPLE VITAMINS-MINERALS (CENTRUM SILVER 50+MEN) TABS    Take 1 tablet by mouth daily.   NITROSTAT 0.4 MG SL TABLET    Take 0.4 mg by mouth every 5 (five) minutes as needed for chest pain.    PANTOPRAZOLE (PROTONIX) 40 MG TABLET    Take 1 tablet (40 mg total) by mouth daily.   POTASSIUM CHLORIDE (K-DUR) 10 MEQ TABLET    Take 10 mEq by mouth daily.   ROSUVASTATIN (CRESTOR) 20 MG TABLET    Take 1 tablet (20 mg total) by mouth daily.   SPIRIVA HANDIHALER 18 MCG INHALATION CAPSULE    Place 18 mcg into inhaler and inhale daily at 8 pm.   Modified Medications   No medications on file  Discontinued Medications   No medications on file    Subjective: 71 Y O male with pmh of  CAD s/p CABG, recent CVA s/p left ICA stenting on aspirin and plavix,  PAF on eliquis, chronic hypoxic repiratory failure on 2.5 L home oxygen , PVD, COPD, HTN, HLD, IDDM, CHFrEF, severe PEM, CKD, anemia, GERD who is here for HFU for recent hospital admission for PNA.    Patient was hospitalized 5/20-5/22 for worsening productive cough and hypoxia for 2-3 days. CT chest 5/20: Ground-glass opacities and consolidation are moderate-to-severe in the right lower lobe and mild in the remaining portions of the lungs. This has increased since 12/20/2021. These findings are suspicious for pneumonia, however malignancy is difficult to exclude. Patient was initially on vancomycin and cefepime which was later transitioned to doxycycline and augmentin for 2 weeks course on discharge. Blood cx 5/20 no growth. MRSA PCR +.  Patient was also admitted 4/29-5/10 for acute hypoxic respiratory failure 2/2 multifocal PNA, required intubation; found to have subacute CVA and  80% stenosis of RT ICA s/p rt carotid stenting 5/5 following which patient developed left sided weakness likely due to hypoperfusion vs new CVA. Patient completed course of abtx for PNA and discharged home on 2 L [place].   Today's Visit Accompanied by room mate. Patient is sitting in the wheelchair. He has been able to wean off oxygen for 2 days, previously on 2.5L [place] on discharge. Cough and SOB is also getting better, produces yellow phelgm but amount is decreasing. Walks with walker. Lives with room mate and niece visits occasionally. Ex smoker. Has completed doxycycline and augmentin 2 days ago. Had one episode of nausea, vomiting but denies abdominal pain and diarrhea. Feels better and getting stronger. Appetite is good.   Review of Systems: all systems reviewed with pertinent positives and negatives as listed above   Past Medical History:  Diagnosis Date   Carotid artery disease ([place])    L-ICA 100%, mod R-ICA dz   COPD (chronic obstructive pulmonary disease) ([place])    Coronary artery disease    s/p CABG February 2010 by Dr. Merilynn Finland   Diabetes [facility])    Emphysema lung ([place])    Hyperlipidemia    Hypertension    Peripheral arterial disease ([place])    post left common iliac and right SFA stenting remotely   Tobacco abuse        Past Surgical History:  Procedure Laterality Date   ABDOMINAL AORTOGRAM N/A 04/20/2017   Procedure: ABDOMINAL AORTOGRAM;  Surgeon: Serafina Mitchell, MD;  Location: [place] [facility];  Service: Cardiovascular;  Laterality: N/A;   ABDOMINAL AORTOGRAM W/LOWER EXTREMITY Right 12/08/2016   Procedure: Abdominal Aortogram w/Lower Extremity;  Surgeon: Serafina Mitchell, MD;  Location: [place] [facility];  Service: Cardiovascular;  Laterality: Right;   ABDOMINAL AORTOGRAM W/LOWER EXTREMITY N/A 09/07/2017   Procedure: ABDOMINAL AORTOGRAM W/LOWER EXTREMITY;  Surgeon: Serafina Mitchell, MD;  Location: [place] [facility];  Service: Cardiovascular;  Laterality: N/A;   CARDIAC  CATHETERIZATION  2010   CAROTID ANGIOGRAM  2014   CORONARY ARTERY BYPASS GRAFT  2010   LIMA-LAD, Lrad-OM1, SVG-RI, SVG-AM-dRCA   DOPPLER ECHOCARDIOGRAPHY  2010   LOWER EXTREMITY ANGIOGRAM  11/2006   left common femoral endarterectomy and patch angioplasty:The mid right SFA was angioplastied with a 4 x 8   LOWER EXTREMITY ANGIOGRAM  2003   left common iliac artery stenting by Dr. Adora Fridge   LOWER EXTREMITY ANGIOGRAPHY Right 04/20/2017   Procedure: Lower Extremity Angiography;  Surgeon: Serafina Mitchell, MD;  Location: [place] [facility];  Service: Cardiovascular;  Laterality: Right;   NM MYOVIEW LTD  2015   PERIPHERAL VASCULAR ATHERECTOMY  Right 12/08/2016   Procedure: Peripheral Vascular Atherectomy;  Surgeon: Serafina Mitchell, MD;  Location: [place] [facility];  Service: Cardiovascular;  Laterality: Right;   PERIPHERAL VASCULAR BALLOON ANGIOPLASTY Right 12/08/2016   Procedure: Peripheral Vascular Balloon Angioplasty;  Surgeon: Serafina Mitchell, MD;  Location: [place] [facility];  Service: Cardiovascular;  Laterality: Right;   PERIPHERAL VASCULAR BALLOON ANGIOPLASTY Right 04/20/2017   Procedure: PERIPHERAL VASCULAR BALLOON ANGIOPLASTY;  Surgeon: Serafina Mitchell, MD;  Location: [place] [facility];  Service: Cardiovascular;  Laterality: Right;  SFA   PERIPHERAL VASCULAR CATHETERIZATION N/A 09/22/2016   Procedure: Abdominal Aortogram w/ bilateral Lower Extremity Runoff;  Surgeon: Serafina Mitchell, MD;  Location: [place] [facility];  Service: Cardiovascular;  Laterality: N/A;   PERIPHERAL VASCULAR CATHETERIZATION Right 09/22/2016   Procedure: Peripheral Vascular Intervention;  Surgeon: Serafina Mitchell, MD;  Location: [place] [facility];  Service: Cardiovascular;  Laterality: Right;   TRANSCAROTID ARTERY REVASCULARIZATION  Right 12/26/2021   Procedure: Right Transcarotid Artery Revascularization;  Surgeon: Serafina Mitchell, MD;  Location: [facility] OR;  Service: Vascular;  Laterality: Right;   ULTRASOUND GUIDANCE  FOR VASCULAR ACCESS Left 12/26/2021   Procedure: ULTRASOUND GUIDANCE FOR VASCULAR ACCESS, LEFT FEMORAL VEIN;  Surgeon: Serafina Mitchell, MD;  Location: [facility];  Service: Vascular;  Laterality: Left;    Social History   Tobacco Use   Smoking status: Former    Packs/day: 0.00    Types: Cigarettes    Quit date: 08/07/2016    Years since quitting: 5.4   Smokeless tobacco: Current    Types: Chew  Vaping Use   Vaping Use: Never used  Substance Use Topics   Alcohol use: No    Alcohol/week: 0.0 standard drinks    Comment: Weekend drinker, quit 2000. NIGHTLY [place]quill   Drug use: No    Family History  Problem Relation Age of Onset   Heart attack Mother    CVA Mother    Heart disease Mother    Diabetes Sister    Hypertension Sister     Allergies  Allergen Reactions   Actos [Pioglitazone] Shortness Of Breath    Leg swelling    Lipitor [Atorvastatin] Other (See Comments)    Leg pain    Zestril [Lisinopril] Cough    Health Maintenance  Topic Date Due   Hepatitis C Screening  Never done   Zoster Vaccines- Shingrix (1 of 2) Never done   OPHTHALMOLOGY EXAM  12/21/2015   URINE MICROALBUMIN  12/21/2015   Pneumonia Vaccine 28+ Years old (3 - PPSV23 if available, else PCV20) 06/14/2018   COLONOSCOPY (Pts 45-34yrs Insurance coverage will need to be confirmed)  01/30/2020   COVID-19 Vaccine (3 - Booster for Pfizer series) 01/30/2020   FOOT EXAM  08/13/2020   INFLUENZA VACCINE  03/24/2022   TETANUS/TDAP  04/14/2022   HEMOGLOBIN A1C  06/22/2022   HPV VACCINES  Aged Out    Objective: BP (!) 143/78   Pulse 97   Temp 98.3 F (36.8 C) (Temporal)    Physical Exam Constitutional:      Appearance: malnourished, thin, sitting in a wheelchair HENT:     Head: Normocephalic and atraumatic.      Mouth: Mucous membranes are moist.  Eyes:    Conjunctiva/sclera: Conjunctivae normal.     Pupils:   Cardiovascular:     Rate and Rhythm: Normal rate and regular rhythm.     Heart sounds:    Pulmonary:     Effort: Pulmonary  effort is normal.     Breath sounds: rhonchi at the bases +  Abdominal:     General: Non distended     Palpations: soft.   Musculoskeletal:        General: Normal range of motion.   Skin:    General: Skin is warm and dry.     Comments:  Neurological:     General:     Mental Status: awake, alert and oriented to person, place, and time.   Psychiatric:        Mood and Affect: Mood normal.   Lab Results Lab Results  Component Value Date   WBC 17.1 (H) 01/12/2022   HGB 8.6 (L) 01/12/2022   HCT 26.3 (L) 01/12/2022   MCV 91.6 01/12/2022   PLT 363 01/12/2022    Lab Results  Component Value Date   CREATININE 1.70 (H) 01/12/2022   BUN 40 (H) 01/12/2022   NA 132 (L) 01/12/2022   K 4.1 01/12/2022   CL 98 01/12/2022   CO2 22 01/12/2022    Lab Results  Component Value Date   ALT 18 01/10/2022   AST 18 01/10/2022   ALKPHOS 84 01/10/2022   BILITOT 0.5 01/10/2022    Lab Results  Component Value Date   CHOL 85 12/21/2021   HDL 32 (L) 12/21/2021   LDLCALC 21 12/21/2021   TRIG 117 12/24/2021   CHOLHDL 2.7 12/21/2021   No results found for: LABRPR, RPRTITER No results found for: HIV1RNAQUANT, HIV1RNAVL, CD4TABS   Results for orders placed or performed during the hospital encounter of 01/10/22  Culture, blood (routine x 2)     Status: None   Collection Time: 01/10/22  1:28 PM   Specimen: BLOOD  Result Value Ref Range Status   Specimen Description BLOOD LEFT ANTECUBITAL  Final   Special Requests   Final    BOTTLES DRAWN AEROBIC AND ANAEROBIC Blood Culture adequate volume   Culture   Final    NO GROWTH 5 DAYS Performed at [facility], [place] [address], [place], [place] [address]    Report Status 01/15/2022 FINAL  Final  Resp Panel by RT-PCR (Flu A&B, Covid) Nasopharyngeal Swab     Status: None   Collection Time: 01/10/22  1:28 PM   Specimen: Nasopharyngeal Swab; Nasopharyngeal(NP) swabs in vial transport medium  Result Value  Ref Range Status   SARS Coronavirus 2 by RT PCR NEGATIVE NEGATIVE Final    Comment: (NOTE) SARS-CoV-2 target nucleic acids are NOT DETECTED.  The SARS-CoV-2 RNA is generally detectable in upper respiratory specimens during the acute phase of infection. The lowest concentration of SARS-CoV-2 viral copies this assay can detect is 138 copies/mL. A negative result does not preclude SARS-Cov-2 infection and should not be used as the sole basis for treatment or other patient management decisions. A negative result may occur with  improper specimen collection/handling, submission of specimen other than nasopharyngeal swab, presence of viral mutation(s) within the areas targeted by this assay, and inadequate number of viral copies(<138 copies/mL). A negative result must be combined with clinical observations, patient history, and epidemiological information. The expected result is Negative.  Fact Sheet for Patients:  EntrepreneurPulse.com.au  Fact Sheet for Healthcare Providers:  IncredibleEmployment.be  This test is no t yet approved or cleared by the [place] FDA and  has been authorized for detection and/or diagnosis of SARS-CoV-2 by FDA under an Emergency Use Authorization (EUA). This EUA will remain  in effect (meaning this test can be used)  for the duration of the COVID-19 declaration under Section 564(b)(1) of the Act, 21 U.S.C.section 360bbb-3(b)(1), unless the authorization is terminated  or revoked sooner.       Influenza A by PCR NEGATIVE NEGATIVE Final   Influenza B by PCR NEGATIVE NEGATIVE Final    Comment: (NOTE) The Xpert Xpress SARS-CoV-2/FLU/RSV plus assay is intended as an aid in the diagnosis of influenza from Nasopharyngeal swab specimens and should not be used as a sole basis for treatment. Nasal washings and aspirates are unacceptable for Xpert Xpress SARS-CoV-2/FLU/RSV testing.  Fact Sheet for  Patients: EntrepreneurPulse.com.au  Fact Sheet for Healthcare Providers: IncredibleEmployment.be  This test is not yet approved or cleared by the [place] FDA and has been authorized for detection and/or diagnosis of SARS-CoV-2 by FDA under an Emergency Use Authorization (EUA). This EUA will remain in effect (meaning this test can be used) for the duration of the COVID-19 declaration under Section 564(b)(1) of the Act, 21 U.S.C. section 360bbb-3(b)(1), unless the authorization is terminated or revoked.  Performed at [facility], [place] [address] [place], [place] [address]   Culture, blood (routine x 2)     Status: None   Collection Time: 01/10/22  1:33 PM   Specimen: BLOOD  Result Value Ref Range Status   Specimen Description BLOOD RIGHT ANTECUBITAL  Final   Special Requests   Final    BOTTLES DRAWN AEROBIC AND ANAEROBIC Blood Culture adequate volume   Culture   Final    NO GROWTH 5 DAYS Performed at [facility], [place] [address], [place], [place] [address]    Report Status 01/15/2022 FINAL  Final  MRSA Next Gen by PCR, Nasal     Status: Abnormal   Collection Time: 01/10/22  3:30 PM   Specimen: Nasal Mucosa; Nasal Swab  Result Value Ref Range Status   MRSA by PCR Next Gen DETECTED (A) NOT DETECTED Final    Comment: RESULT CALLED TO, READ BACK BY AND VERIFIED WITH: M,FUTRELL RN _0  01/10/22 EB (NOTE) The GeneXpert MRSA Assay (FDA approved for NASAL specimens only), is one component of a comprehensive MRSA colonization surveillance program. It is not intended to diagnose MRSA infection nor to guide or monitor treatment for MRSA infections. Test performance is not FDA approved in patients less than 30 years old. Performed at [facility], [place] [address] [place], [place] [address]    Imaging DG Swallowing Func-Speech Pathology  Result Date: 01/12/2022 Table formatting from the original result was not included.  Objective Swallowing Evaluation: Type of Study: MBS-Modified Barium Swallow Study  Patient Details Name: IZACC DEMEYER MRN: [id number] Date of Birth: Mar 03, 1951 Today's Date: 01/12/2022 Time: SLP Start Time (ACUTE ONLY): 1937 -SLP Stop Time (ACUTE ONLY): 0907 SLP Time Calculation (min) (ACUTE ONLY): 11 min Past Medical History: Past Medical History: Diagnosis Date  Carotid artery disease ([place])   L-ICA 100%, mod R-ICA dz  COPD (chronic obstructive pulmonary disease) ([place])   Coronary artery disease   s/p CABG February 2010 by Dr. Merilynn Finland  Diabetes [facility])   Emphysema lung ([place])   Hyperlipidemia   Hypertension   Peripheral arterial disease ([place])   post left common iliac and right SFA stenting remotely  Tobacco abuse     Past Surgical History: Past Surgical History: Procedure Laterality Date  ABDOMINAL AORTOGRAM N/A 04/20/2017  Procedure: ABDOMINAL AORTOGRAM;  Surgeon: Serafina Mitchell, MD;  Location: [place] [facility];  Service: Cardiovascular;  Laterality: N/A;  ABDOMINAL AORTOGRAM  W/LOWER EXTREMITY Right 12/08/2016  Procedure: Abdominal Aortogram w/Lower Extremity;  Surgeon: Serafina Mitchell, MD;  Location: [place] [facility];  Service: Cardiovascular;  Laterality: Right;  ABDOMINAL AORTOGRAM W/LOWER EXTREMITY N/A 09/07/2017  Procedure: ABDOMINAL AORTOGRAM W/LOWER EXTREMITY;  Surgeon: Serafina Mitchell, MD;  Location: [place] [facility];  Service: Cardiovascular;  Laterality: N/A;  CARDIAC CATHETERIZATION  2010  CAROTID ANGIOGRAM  2014  CORONARY ARTERY BYPASS GRAFT  2010  LIMA-LAD, Lrad-OM1, SVG-RI, SVG-AM-dRCA  DOPPLER ECHOCARDIOGRAPHY  2010  LOWER EXTREMITY ANGIOGRAM  11/2006  left common femoral endarterectomy and patch angioplasty:The mid right SFA was angioplastied with a 4 x 8  LOWER EXTREMITY ANGIOGRAM  2003  left common iliac artery stenting by Dr. Adora Fridge  LOWER EXTREMITY ANGIOGRAPHY Right 04/20/2017  Procedure: Lower Extremity Angiography;  Surgeon: Serafina Mitchell, MD;  Location: [place] [facility];   Service: Cardiovascular;  Laterality: Right;  NM MYOVIEW LTD  2015  PERIPHERAL VASCULAR ATHERECTOMY Right 12/08/2016  Procedure: Peripheral Vascular Atherectomy;  Surgeon: Serafina Mitchell, MD;  Location: [place] [facility];  Service: Cardiovascular;  Laterality: Right;  PERIPHERAL VASCULAR BALLOON ANGIOPLASTY Right 12/08/2016  Procedure: Peripheral Vascular Balloon Angioplasty;  Surgeon: Serafina Mitchell, MD;  Location: [place] [facility];  Service: Cardiovascular;  Laterality: Right;  PERIPHERAL VASCULAR BALLOON ANGIOPLASTY Right 04/20/2017  Procedure: PERIPHERAL VASCULAR BALLOON ANGIOPLASTY;  Surgeon: Serafina Mitchell, MD;  Location: [place] [facility];  Service: Cardiovascular;  Laterality: Right;  SFA  PERIPHERAL VASCULAR CATHETERIZATION N/A 09/22/2016  Procedure: Abdominal Aortogram w/ bilateral Lower Extremity Runoff;  Surgeon: Serafina Mitchell, MD;  Location: [place] [facility];  Service: Cardiovascular;  Laterality: N/A;  PERIPHERAL VASCULAR CATHETERIZATION Right 09/22/2016  Procedure: Peripheral Vascular Intervention;  Surgeon: Serafina Mitchell, MD;  Location: [place] [facility];  Service: Cardiovascular;  Laterality: Right;  TRANSCAROTID ARTERY REVASCULARIZATION  Right 12/26/2021  Procedure: Right Transcarotid Artery Revascularization;  Surgeon: Serafina Mitchell, MD;  Location: [facility] OR;  Service: Vascular;  Laterality: Right;  ULTRASOUND GUIDANCE FOR VASCULAR ACCESS Left 12/26/2021  Procedure: ULTRASOUND GUIDANCE FOR VASCULAR ACCESS, LEFT FEMORAL VEIN;  Surgeon: Serafina Mitchell, MD;  [facility]: [facility];  Service: Vascular;  Laterality: Left; HPI: 70 year old man with history of recent right carotid stent placement, dysphagia, CVA, A-fib presents to the hospital with productive cough and shortness of breath, be hypoxemic with imaging findings consistent with right middle lobe and right lower lobe pneumonia with associated parapneumonic effusion. Recent hospitalization for CVA with MBS 5/8  > flash penetration elicited  cough, stasis in esophagus (possible account for cough), no dentition and needs soft foods > Dys 2/thin rec'd. Follow up revealed regurgitation during meals. Disussed with dtr and diet downgraded to puree. PMH: COPD, DM, CAD.  Subjective: alert  Recommendations for follow up therapy are one component of a multi-disciplinary discharge planning process, led by the attending physician.  Recommendations may be updated based on patient status, additional functional criteria and insurance authorization. Assessment / Plan / Recommendation   01/12/2022  10:06 AM Clinical Impressions Clinical Impression Pt's oropharyngeal swallow resembles that of MBS on 12/29/21. There was no laryngeal penetration or aspiration present and pt coughed and cleard his throat throughout study which is present during meals and previous MBS.Min-mod vallecular residue and trace-min pyriform sinus residual that a second swallow reduces. Timing of swallow and elevation of larynx was adequate. His esophagus was scanned but not diagnosed without significant stasis observed. Mastication of regular texture was prolonged as he  does not have dentition therefore recommended that he continue Dys 3 texture, thin liquids, pills whole in puree, straws allowed and second swallows periodically. Will follow up briefly for education. SLP Visit Diagnosis Dysphagia, pharyngeal phase (R13.13) Impact on safety and function Mild aspiration risk     01/12/2022  10:06 AM Treatment Recommendations Treatment Recommendations Therapy as outlined in treatment plan below     01/12/2022  10:06 AM Prognosis Prognosis for Safe Diet Advancement Good   01/12/2022  10:06 AM Diet Recommendations SLP Diet Recommendations Dysphagia 3 (Mech soft) solids;Thin liquid Liquid Administration via Straw;Cup Medication Administration Whole meds with puree Compensations Slow rate;Small sips/bites;Minimize environmental distractions;Multiple dry swallows after each bite/sip Postural Changes Seated  upright at 90 degrees     01/12/2022  10:06 AM Other Recommendations Oral Care Recommendations Oral care BID Follow Up Recommendations No SLP follow up Assistance recommended at discharge Set up Supervision/Assistance Functional Status Assessment Patient has had a recent decline in their functional status and demonstrates the ability to make significant improvements in function in a reasonable and predictable amount of time.   01/12/2022  10:06 AM Frequency and Duration  Speech Therapy Frequency (ACUTE ONLY) min 1 x/week Treatment Duration 1 week     01/12/2022  10:06 AM Oral Phase Oral - Thin Cup WFL Oral - Thin Straw WFL Oral - Puree WFL Oral - Mech Soft NT Oral - Regular Other (Comment) Oral - Pill NT    01/12/2022  10:06 AM Pharyngeal Phase Pharyngeal Phase Impaired Pharyngeal- Thin Cup Pharyngeal residue - valleculae;Pharyngeal residue - pyriform Pharyngeal- Thin Straw Pharyngeal residue - valleculae;Pharyngeal residue - pyriform Pharyngeal Material does not enter airway Pharyngeal- Puree WFL Pharyngeal- Mechanical Soft NT Pharyngeal- Regular Pharyngeal residue - valleculae Pharyngeal- Pill NT    01/12/2022  10:06 AM Cervical Esophageal Phase  Cervical Esophageal Phase WFL [place] Siren 01/12/2022, 10:19 AM                     CT CHEST WO CONTRAST  Result Date: 01/10/2022 CLINICAL DATA:  Pneumonia. EXAM: CT CHEST WITHOUT CONTRAST TECHNIQUE: Multidetector CT imaging of the chest was performed following the standard protocol without IV contrast. RADIATION DOSE REDUCTION: This exam was performed according to the departmental dose-optimization program which includes automated exposure control, adjustment of the mA and/or kV according to patient size and/or use of iterative reconstruction technique. COMPARISON:  Same day chest radiograph and chest CT dated 12/20/2021. FINDINGS: Cardiovascular: The right and left pulmonary arteries are enlarged, measuring 2.1 cm and 1.9 cm respectively. Vascular calcifications  are seen in the coronary arteries and aortic arch. The heart size is normal. There is no pericardial effusion. Mediastinum/Nodes: No enlarged mediastinal or axillary lymph nodes. Thyroid gland, trachea, and esophagus demonstrate no significant findings. Lungs/Pleura: There is a small right pleural effusion with associated atelectasis. Moderate to severe ground-glass opacities and consolidation are seen the right lower lobe and to a lesser extent the left lower lobe, left upper lobe, right middle lobe, and right upper lobe. This has increased since 12/20/2021. There is no left pleural effusion. There is no pneumothorax. Centrilobular and paraseptal emphysema is redemonstrated. Upper Abdomen: No acute abnormality. Musculoskeletal: Degenerative changes are seen in the spine. IMPRESSION: 1. Ground-glass opacities and consolidation are moderate-to-severe in the right lower lobe and mild in the remaining portions of the lungs. This has increased since 12/20/2021. These findings are suspicious for pneumonia, however malignancy is difficult to exclude. 2. Enlarged left and right pulmonary arteries, suggestive of  pulmonary hypertension. Aortic Atherosclerosis (ICD10-I70.0) and Emphysema (ICD10-J43.9). Electronically Signed   By: Zerita Boers M.D.   On: 01/10/2022 16:56   DG Chest Port 1 View  Result Date: 01/10/2022 CLINICAL DATA:  Shortness of breath EXAM: PORTABLE CHEST 1 VIEW COMPARISON:  12/29/2021 and prior studies FINDINGS: Unchanged cardiomediastinal silhouette and CABG changes are again noted. Airspace opacities within the RIGHT lung are again noted. Trace RIGHT pleural effusion again noted. Mild LEFT basilar atelectasis again identified. Decreased pulmonary vascular congestion noted. No pneumothorax or acute bony abnormality noted. IMPRESSION: Unchanged RIGHT lung airspace opacities and trace RIGHT pleural effusion. Decreased pulmonary vascular congestion. Electronically Signed   By: Margarette Canada M.D.   On:  01/10/2022 14:13    Problem List Items Addressed This Visit       Respiratory   HCAP (healthcare-associated pneumonia) - Primary   Relevant Orders   Ambulatory referral to Pulmonology     Other   Medication monitoring encounter   Screening of cancer  ' Assessment/Plan Pneumonia:  health care acquired + possible aspiration PNA Medication Monitoring EX smoker  Completed 2 weeks of antibiotics as instructed 2 days ago and clinically improving. Off oxygen  Am referral to Pulm for possible need for lung ca screening  Fu as needed   I have personally spent 68 minutes involved in face-to-face and non-face-to-face activities for this patient on the day of the visit. Professional time spent includes the following activities: Preparing to see the patient (review of tests), Obtaining and/or reviewing separately obtained history (admission/discharge record), Performing a medically appropriate examination and/or evaluation , Ordering medications/tests/procedures, referring and communicating with other health care professionals, Documenting clinical information in the EMR, Independently interpreting results (not separately reported), Communicating results to the patient/family/caregiver, Counseling and educating the patient/family/caregiver and Care coordination (not separately reported).   Wilber Oliphant, [place] [facility] 01/28/2022, 2:30 PM

## 2022-01-29 DIAGNOSIS — E1151 Type 2 diabetes mellitus with diabetic peripheral angiopathy without gangrene: Secondary | ICD-10-CM | POA: Diagnosis not present

## 2022-01-29 DIAGNOSIS — Z48812 Encounter for surgical aftercare following surgery on the circulatory system: Secondary | ICD-10-CM | POA: Diagnosis not present

## 2022-01-29 DIAGNOSIS — I739 Peripheral vascular disease, unspecified: Secondary | ICD-10-CM | POA: Diagnosis not present

## 2022-01-29 DIAGNOSIS — Z5181 Encounter for therapeutic drug level monitoring: Secondary | ICD-10-CM | POA: Insufficient documentation

## 2022-01-29 DIAGNOSIS — I2510 Atherosclerotic heart disease of native coronary artery without angina pectoris: Secondary | ICD-10-CM | POA: Diagnosis not present

## 2022-01-29 DIAGNOSIS — I129 Hypertensive chronic kidney disease with stage 1 through stage 4 chronic kidney disease, or unspecified chronic kidney disease: Secondary | ICD-10-CM | POA: Diagnosis not present

## 2022-01-29 DIAGNOSIS — E1122 Type 2 diabetes mellitus with diabetic chronic kidney disease: Secondary | ICD-10-CM | POA: Diagnosis not present

## 2022-01-29 DIAGNOSIS — Z129 Encounter for screening for malignant neoplasm, site unspecified: Secondary | ICD-10-CM | POA: Insufficient documentation

## 2022-01-31 DIAGNOSIS — N1831 Chronic kidney disease, stage 3a: Secondary | ICD-10-CM | POA: Diagnosis not present

## 2022-01-31 DIAGNOSIS — K219 Gastro-esophageal reflux disease without esophagitis: Secondary | ICD-10-CM | POA: Diagnosis not present

## 2022-01-31 DIAGNOSIS — I5022 Chronic systolic (congestive) heart failure: Secondary | ICD-10-CM | POA: Diagnosis not present

## 2022-01-31 DIAGNOSIS — Z7902 Long term (current) use of antithrombotics/antiplatelets: Secondary | ICD-10-CM | POA: Diagnosis not present

## 2022-01-31 DIAGNOSIS — Z7982 Long term (current) use of aspirin: Secondary | ICD-10-CM | POA: Diagnosis not present

## 2022-01-31 DIAGNOSIS — E1151 Type 2 diabetes mellitus with diabetic peripheral angiopathy without gangrene: Secondary | ICD-10-CM | POA: Diagnosis not present

## 2022-01-31 DIAGNOSIS — J449 Chronic obstructive pulmonary disease, unspecified: Secondary | ICD-10-CM | POA: Diagnosis not present

## 2022-01-31 DIAGNOSIS — J189 Pneumonia, unspecified organism: Secondary | ICD-10-CM | POA: Diagnosis not present

## 2022-01-31 DIAGNOSIS — Z9981 Dependence on supplemental oxygen: Secondary | ICD-10-CM | POA: Diagnosis not present

## 2022-01-31 DIAGNOSIS — R1312 Dysphagia, oropharyngeal phase: Secondary | ICD-10-CM | POA: Diagnosis not present

## 2022-01-31 DIAGNOSIS — I739 Peripheral vascular disease, unspecified: Secondary | ICD-10-CM | POA: Diagnosis not present

## 2022-01-31 DIAGNOSIS — E43 Unspecified severe protein-calorie malnutrition: Secondary | ICD-10-CM | POA: Diagnosis not present

## 2022-01-31 DIAGNOSIS — Z7901 Long term (current) use of anticoagulants: Secondary | ICD-10-CM | POA: Diagnosis not present

## 2022-01-31 DIAGNOSIS — I480 Paroxysmal atrial fibrillation: Secondary | ICD-10-CM | POA: Diagnosis not present

## 2022-01-31 DIAGNOSIS — E785 Hyperlipidemia, unspecified: Secondary | ICD-10-CM | POA: Diagnosis not present

## 2022-01-31 DIAGNOSIS — Z7984 Long term (current) use of oral hypoglycemic drugs: Secondary | ICD-10-CM | POA: Diagnosis not present

## 2022-01-31 DIAGNOSIS — Z794 Long term (current) use of insulin: Secondary | ICD-10-CM | POA: Diagnosis not present

## 2022-01-31 DIAGNOSIS — I129 Hypertensive chronic kidney disease with stage 1 through stage 4 chronic kidney disease, or unspecified chronic kidney disease: Secondary | ICD-10-CM | POA: Diagnosis not present

## 2022-01-31 DIAGNOSIS — Z48812 Encounter for surgical aftercare following surgery on the circulatory system: Secondary | ICD-10-CM | POA: Diagnosis not present

## 2022-01-31 DIAGNOSIS — D509 Iron deficiency anemia, unspecified: Secondary | ICD-10-CM | POA: Diagnosis not present

## 2022-01-31 DIAGNOSIS — I2510 Atherosclerotic heart disease of native coronary artery without angina pectoris: Secondary | ICD-10-CM | POA: Diagnosis not present

## 2022-01-31 DIAGNOSIS — E1122 Type 2 diabetes mellitus with diabetic chronic kidney disease: Secondary | ICD-10-CM | POA: Diagnosis not present

## 2022-02-04 DIAGNOSIS — I739 Peripheral vascular disease, unspecified: Secondary | ICD-10-CM | POA: Diagnosis not present

## 2022-02-04 DIAGNOSIS — E1122 Type 2 diabetes mellitus with diabetic chronic kidney disease: Secondary | ICD-10-CM | POA: Diagnosis not present

## 2022-02-04 DIAGNOSIS — Z48812 Encounter for surgical aftercare following surgery on the circulatory system: Secondary | ICD-10-CM | POA: Diagnosis not present

## 2022-02-04 DIAGNOSIS — I2510 Atherosclerotic heart disease of native coronary artery without angina pectoris: Secondary | ICD-10-CM | POA: Diagnosis not present

## 2022-02-04 DIAGNOSIS — I129 Hypertensive chronic kidney disease with stage 1 through stage 4 chronic kidney disease, or unspecified chronic kidney disease: Secondary | ICD-10-CM | POA: Diagnosis not present

## 2022-02-04 DIAGNOSIS — E1151 Type 2 diabetes mellitus with diabetic peripheral angiopathy without gangrene: Secondary | ICD-10-CM | POA: Diagnosis not present

## 2022-02-05 ENCOUNTER — Telehealth: Payer: Self-pay

## 2022-02-05 DIAGNOSIS — I2510 Atherosclerotic heart disease of native coronary artery without angina pectoris: Secondary | ICD-10-CM | POA: Diagnosis not present

## 2022-02-05 DIAGNOSIS — Z48812 Encounter for surgical aftercare following surgery on the circulatory system: Secondary | ICD-10-CM | POA: Diagnosis not present

## 2022-02-05 DIAGNOSIS — E1122 Type 2 diabetes mellitus with diabetic chronic kidney disease: Secondary | ICD-10-CM | POA: Diagnosis not present

## 2022-02-05 DIAGNOSIS — I739 Peripheral vascular disease, unspecified: Secondary | ICD-10-CM | POA: Diagnosis not present

## 2022-02-05 DIAGNOSIS — I129 Hypertensive chronic kidney disease with stage 1 through stage 4 chronic kidney disease, or unspecified chronic kidney disease: Secondary | ICD-10-CM | POA: Diagnosis not present

## 2022-02-05 DIAGNOSIS — E1151 Type 2 diabetes mellitus with diabetic peripheral angiopathy without gangrene: Secondary | ICD-10-CM | POA: Diagnosis not present

## 2022-02-05 NOTE — Telephone Encounter (Signed)
Reginald Ayers with [facility] called stating that his weight of 108.6 lbs was outside the sliding scale weight range.  Returned call, two identifiers used. Reviewed pt's chart and the only medication that pt takes that would be related to weight is Lasix. [name] felt that the weight gain was not d/t any edema, as evidenced by physical assessment. Pt had stopped taking his Jardiance because he had lost too much weight, so the weight gain was d/t no longer taking it. Instructed [name] to contact pt's PCP or Cardiologist since our office does not manage Lasix. Confirmed understanding.

## 2022-02-10 DIAGNOSIS — Z48812 Encounter for surgical aftercare following surgery on the circulatory system: Secondary | ICD-10-CM | POA: Diagnosis not present

## 2022-02-10 DIAGNOSIS — E1122 Type 2 diabetes mellitus with diabetic chronic kidney disease: Secondary | ICD-10-CM | POA: Diagnosis not present

## 2022-02-10 DIAGNOSIS — I2510 Atherosclerotic heart disease of native coronary artery without angina pectoris: Secondary | ICD-10-CM | POA: Diagnosis not present

## 2022-02-10 DIAGNOSIS — I129 Hypertensive chronic kidney disease with stage 1 through stage 4 chronic kidney disease, or unspecified chronic kidney disease: Secondary | ICD-10-CM | POA: Diagnosis not present

## 2022-02-10 DIAGNOSIS — I739 Peripheral vascular disease, unspecified: Secondary | ICD-10-CM | POA: Diagnosis not present

## 2022-02-10 DIAGNOSIS — E1151 Type 2 diabetes mellitus with diabetic peripheral angiopathy without gangrene: Secondary | ICD-10-CM | POA: Diagnosis not present

## 2022-02-11 DIAGNOSIS — D649 Anemia, unspecified: Secondary | ICD-10-CM | POA: Diagnosis not present

## 2022-02-12 DIAGNOSIS — E1151 Type 2 diabetes mellitus with diabetic peripheral angiopathy without gangrene: Secondary | ICD-10-CM | POA: Diagnosis not present

## 2022-02-12 DIAGNOSIS — I2510 Atherosclerotic heart disease of native coronary artery without angina pectoris: Secondary | ICD-10-CM | POA: Diagnosis not present

## 2022-02-12 DIAGNOSIS — I129 Hypertensive chronic kidney disease with stage 1 through stage 4 chronic kidney disease, or unspecified chronic kidney disease: Secondary | ICD-10-CM | POA: Diagnosis not present

## 2022-02-12 DIAGNOSIS — Z48812 Encounter for surgical aftercare following surgery on the circulatory system: Secondary | ICD-10-CM | POA: Diagnosis not present

## 2022-02-12 DIAGNOSIS — E1122 Type 2 diabetes mellitus with diabetic chronic kidney disease: Secondary | ICD-10-CM | POA: Diagnosis not present

## 2022-02-12 DIAGNOSIS — I739 Peripheral vascular disease, unspecified: Secondary | ICD-10-CM | POA: Diagnosis not present

## 2022-02-16 DIAGNOSIS — D649 Anemia, unspecified: Secondary | ICD-10-CM | POA: Diagnosis not present

## 2022-02-18 DIAGNOSIS — I739 Peripheral vascular disease, unspecified: Secondary | ICD-10-CM | POA: Diagnosis not present

## 2022-02-18 DIAGNOSIS — E1151 Type 2 diabetes mellitus with diabetic peripheral angiopathy without gangrene: Secondary | ICD-10-CM | POA: Diagnosis not present

## 2022-02-18 DIAGNOSIS — I2510 Atherosclerotic heart disease of native coronary artery without angina pectoris: Secondary | ICD-10-CM | POA: Diagnosis not present

## 2022-02-18 DIAGNOSIS — Z48812 Encounter for surgical aftercare following surgery on the circulatory system: Secondary | ICD-10-CM | POA: Diagnosis not present

## 2022-02-18 DIAGNOSIS — I129 Hypertensive chronic kidney disease with stage 1 through stage 4 chronic kidney disease, or unspecified chronic kidney disease: Secondary | ICD-10-CM | POA: Diagnosis not present

## 2022-02-18 DIAGNOSIS — E1122 Type 2 diabetes mellitus with diabetic chronic kidney disease: Secondary | ICD-10-CM | POA: Diagnosis not present

## 2022-02-19 DIAGNOSIS — E1122 Type 2 diabetes mellitus with diabetic chronic kidney disease: Secondary | ICD-10-CM | POA: Diagnosis not present

## 2022-02-19 DIAGNOSIS — I2510 Atherosclerotic heart disease of native coronary artery without angina pectoris: Secondary | ICD-10-CM | POA: Diagnosis not present

## 2022-02-19 DIAGNOSIS — I129 Hypertensive chronic kidney disease with stage 1 through stage 4 chronic kidney disease, or unspecified chronic kidney disease: Secondary | ICD-10-CM | POA: Diagnosis not present

## 2022-02-19 DIAGNOSIS — E1151 Type 2 diabetes mellitus with diabetic peripheral angiopathy without gangrene: Secondary | ICD-10-CM | POA: Diagnosis not present

## 2022-02-19 DIAGNOSIS — I739 Peripheral vascular disease, unspecified: Secondary | ICD-10-CM | POA: Diagnosis not present

## 2022-02-19 DIAGNOSIS — Z48812 Encounter for surgical aftercare following surgery on the circulatory system: Secondary | ICD-10-CM | POA: Diagnosis not present

## 2022-02-25 ENCOUNTER — Ambulatory Visit (INDEPENDENT_AMBULATORY_CARE_PROVIDER_SITE_OTHER): Payer: Medicare Other | Admitting: Internal Medicine

## 2022-02-25 ENCOUNTER — Encounter: Payer: Self-pay | Admitting: Internal Medicine

## 2022-02-25 VITALS — BP 130/70 | HR 89 | Ht 61.0 in | Wt 109.1 lb

## 2022-02-25 DIAGNOSIS — J310 Chronic rhinitis: Secondary | ICD-10-CM | POA: Diagnosis not present

## 2022-02-25 DIAGNOSIS — J449 Chronic obstructive pulmonary disease, unspecified: Secondary | ICD-10-CM

## 2022-02-25 DIAGNOSIS — R053 Chronic cough: Secondary | ICD-10-CM | POA: Diagnosis not present

## 2022-02-25 MED ORDER — AZELASTINE-FLUTICASONE 137-50 MCG/ACT NA SUSP: 1.0000 | Freq: Two times a day (BID) | NASAL | 5 refills | Status: DC

## 2022-02-25 NOTE — Patient Instructions (Addendum)
Please schedule follow up scheduled with myself in 3 months.  If my schedule is not open yet, we will contact you with a reminder closer to that time. Please call [phone] if you haven't heard from [place] a month before.   No signs of pneumonia today.   I am recommending a new nose spray for your nasal drainage and sinus problems  Dymista - 1 spray on each side of your nose twice a day for first week, then 1 spray on each side.   Instructions for use: If you also use a saline nasal spray or rinse, use that first. Position the head with the chin slightly tucked. Use the right hand to spray into the left nostril and the right hand to spray into the left nostril.   Point the bottle away from the septum of your nose (cartilage that divides the two sides of your nose).  Hold the nostril closed on the opposite side from where you will spray Spray once and gently sniff to pull the medicine into the higher parts of your nose.  Don't sniff too hard as the medicine will drain down the back of your throat instead. Repeat with a second spray on the same side if prescribed. Repeat on the other side of your nose.  Keep taking your inhaler as needed. If you end up using it more than 2-3 times/week let me know and we can try adding on other inhalers.

## 2022-02-25 NOTE — Progress Notes (Signed)
Reginald Ayers    [id number]    1951/06/09  Primary Care Physician:Polite, Windy Fast, MD  Referring Physician: Odette Fraction, MD [address] [place],  [place] [address] Reason for Consultation: cough Date of Consultation: 02/25/2022  Chief complaint:   Chief Complaint  Patient presents with   Consult    Pt consult, still coughing up yellow mucus, along with nasal drainage.      HPI: Reginald Ayers is a 71 y.o. man who presents for new patient evlauation for coughing. Past medical history of CAD s/p CABG. Right ICA stenosis s/p stenting.    Was admitted in early May and late May 2023 for respiratory failure and multifoca pneumonia. In May he had subavute CVA and had right ICA stenting due to 80% stenosis. Was treated with antibiotics then and was actually intubated for multifocal pneumonia. Readmitted end of may fo multifocal pneumonia and treated with antibiotics for prolonged course. Blood cultures negative.   He was discharged to home with caregiver and roommate. He seems to be doing pretty good since discharge. He does have some sinus pressure, he coughs still after discharge.   He has some chills but does not have fevers. Appetite is good. He is eating mostly soft foods at home. He denies coughing after eating. He does cough when he lays down at night. He takes mucinex for this. Denies dyspnea. He ambulates with a walker. Has albuterol inhaler which he takes mostly at night time.    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  Exposures: lives at home with roommate and has a caregiver he refers to as his niece.  Smoking history: former smoker, chews tobacco now 50 years x 1 ppd quit in 2017.   Social History   Occupational History   Occupation: Retired Radiographer, therapeutic  Tobacco Use   Smoking status: Former    Packs/day: 0.00    Types: Cigarettes     Quit date: 08/07/2016    Years since quitting: 5.5   Smokeless tobacco: Current    Types: Chew  Vaping Use   Vaping Use: Never used  Substance and Sexual Activity   Alcohol use: No    Alcohol/week: 0.0 standard drinks of alcohol    Comment: Weekend drinker, quit 2000. NIGHTLY nyquill   Drug use: No   Sexual activity: Not on file    Relevant family history:  Family History  Problem Relation Age of Onset   Heart attack Mother    CVA Mother    Heart disease Mother    Diabetes Sister    Hypertension Sister     Past Medical History:  Diagnosis Date   Carotid artery disease (HCC)    L-ICA 100%, mod R-ICA dz   COPD (chronic obstructive pulmonary disease) (HCC)    Coronary artery disease    s/p CABG February 2010 by Dr. Andrey Spearman   Diabetes [facility])    Emphysema lung (HCC)    Hyperlipidemia    Hypertension    Peripheral arterial disease (HCC)    post left common iliac and right SFA stenting remotely   Tobacco abuse         Past Surgical History:  Procedure Laterality Date   ABDOMINAL AORTOGRAM N/A 04/20/2017   Procedure: ABDOMINAL AORTOGRAM;  Surgeon: Nada Libman, MD;  [facility];  Service: Cardiovascular;  Laterality: N/A;   ABDOMINAL AORTOGRAM W/LOWER EXTREMITY Right 12/08/2016   Procedure: Abdominal Aortogram w/Lower Extremity;  Surgeon: Nada Libman, MD;  Location: [facility];  Service: Cardiovascular;  Laterality: Right;   ABDOMINAL AORTOGRAM W/LOWER EXTREMITY N/A 09/07/2017   Procedure: ABDOMINAL AORTOGRAM W/LOWER EXTREMITY;  Surgeon: Nada Libman, MD;  Location: [facility];  Service: Cardiovascular;  Laterality: N/A;   CARDIAC CATHETERIZATION  2010   CAROTID ANGIOGRAM  2014   CORONARY ARTERY BYPASS GRAFT  2010   LIMA-LAD, Lrad-OM1, SVG-RI, SVG-AM-dRCA   DOPPLER ECHOCARDIOGRAPHY  2010   LOWER EXTREMITY ANGIOGRAM  11/2006   left common femoral endarterectomy and patch angioplasty:The mid right SFA was  angioplastied with a 4 x 8   LOWER EXTREMITY ANGIOGRAM  2003   left common iliac artery stenting by Dr. Erlene Quan   LOWER EXTREMITY ANGIOGRAPHY Right 04/20/2017   Procedure: Lower Extremity Angiography;  Surgeon: Nada Libman, MD;  Location: [facility] INVASIVE CV LAB;  Service: Cardiovascular;  Laterality: Right;   NM MYOVIEW LTD  2015   PERIPHERAL VASCULAR ATHERECTOMY Right 12/08/2016   Procedure: Peripheral Vascular Atherectomy;  Surgeon: Nada Libman, MD;  [facility]: [facility];  Service: Cardiovascular;  Laterality: Right;   PERIPHERAL VASCULAR BALLOON ANGIOPLASTY Right 12/08/2016   Procedure: Peripheral Vascular Balloon Angioplasty;  Surgeon: Nada Libman, MD;  [facility]: [facility];  Service: Cardiovascular;  Laterality: Right;   PERIPHERAL VASCULAR BALLOON ANGIOPLASTY Right 04/20/2017   Procedure: PERIPHERAL VASCULAR BALLOON ANGIOPLASTY;  Surgeon: Nada Libman, MD;  [facility];  Service: Cardiovascular;  Laterality: Right;  SFA   PERIPHERAL VASCULAR CATHETERIZATION N/A 09/22/2016   Procedure: Abdominal Aortogram w/ bilateral Lower Extremity Runoff;  Surgeon: Nada Libman, MD;  [facility]: [facility];  Service: Cardiovascular;  Laterality: N/A;   PERIPHERAL VASCULAR CATHETERIZATION Right 09/22/2016   Procedure: Peripheral Vascular Intervention;  Surgeon: Nada Libman, MD;  Location: [facility];  Service: Cardiovascular;  Laterality: Right;   TRANSCAROTID ARTERY REVASCULARIZATION  Right 12/26/2021   Procedure: Right Transcarotid Artery Revascularization;  Surgeon: Nada Libman, MD;  Location: [facility] OR;  Service: Vascular;  Laterality: Right;   ULTRASOUND GUIDANCE FOR VASCULAR ACCESS Left 12/26/2021   Procedure: ULTRASOUND GUIDANCE FOR VASCULAR ACCESS, LEFT FEMORAL VEIN;  Surgeon: Nada Libman, MD;  Location: [facility] OR;  Service: Vascular;  Laterality: Left;     Physical Exam: Blood pressure 130/70, pulse 89, height 5\' 1"  (1.549 m), weight  109 lb 1.6 oz (49.5 kg), SpO2 96 %. Gen:      No acute distress, thin, pale elderly in wheelchair, chronically ill appearing ENT:  no nasal polyps, mucus membranes moist Lungs:    No increased respiratory effort, symmetric chest wall excursion, clear to auscultation bilaterally, no wheezes or crackles CV:         Regular rate and rhythm; no murmurs, rubs, or gallops.  No pedal edema Abd:      + bowel sounds; soft, non-tender; no distension MSK: no acute synovitis of DIP or PIP joints, no mechanics hands.  Skin:      Warm and dry; no rashes Neuro: normal speech, no focal facial asymmetry. Has moist rattling quality to his voice.  Psych: alert and oriented x3, normal mood and affect   Data Reviewed/Medical Decision Making:  Independent interpretation of tests: Imaging:  Review of patient's CT Chest May20 2023 images revealed centrilobular emphysema, bilateral ground glass worse in the  RML consistent with aspiration. The patient's images have been independently reviewed by me.    MBS May 8 and Jan 10 2022 shows dysphagia with mild aspiration risk.  PFTs: I have personally reviewed the patient's PFTs and      No data to display          Labs:  Lab Results  Component Value Date   WBC 17.1 (H) 01/12/2022   HGB 8.6 (L) 01/12/2022   HCT 26.3 (L) 01/12/2022   MCV 91.6 01/12/2022   PLT 363 01/12/2022   Lab Results  Component Value Date   NA 132 (L) 01/12/2022   K 4.1 01/12/2022   CL 98 01/12/2022   CO2 22 01/12/2022      Immunization status:  Immunization History  Administered Date(s) Administered   Influenza Split 06/14/2009, 06/26/2011, 08/22/2012, 08/25/2012, 06/14/2013   Influenza, High Dose Seasonal PF 07/05/2015, 06/22/2016   Influenza-Unspecified 06/06/2014   PFIZER(Purple Top)SARS-COV-2 Vaccination 11/10/2019, 12/05/2019   Pneumococcal Conjugate-13 12/21/2014   Pneumococcal Polysaccharide-23 09/25/2003, 06/14/2013   Td 07/13/2007   Tdap 04/14/2012   Zoster,  Live 03/28/2012     I reviewed prior external note(s) from hospital stay  I reviewed the result(s) of the labs and imaging as noted above.   I have ordered ambulatory desaturation study   Assessment:  Chronic cough, worsening Emphysema Chronic rhinitis Aspiration risk History of CVA Physical deconditioning and debility.   Plan/Recommendations:  No clinical evidence of pneumonia today.  He probably has emphysema but is not active enough to perceive any dyspnea from this. Will defer pfts for now.  I did am ambulatory desaturation study and he was able to complete one lap with walker before stopping due to fatigue. Did not desaturation to require home oxygen.  Chronic cough likely from rhinitis. Will trial dymista twice day and see if this helps. If not would consider ipratropium for vasomotor rhinitis. There may be a component of aspiration as well although he and his niece do state they follow a modified mechanically soft diet.   We discussed disease management and progression at length today.   I spent 45 minutes in the care of this patient today including pre-charting, chart review, review of results, face-to-face care, coordination of care and communication with consultants etc.).  Return to Care: Return in about 3 months (around 05/28/2022).  Durel Salts, MD Pulmonary and Critical Care Medicine [facility] Office:[phone]  CC: Odette Fraction, MD

## 2022-02-26 DIAGNOSIS — E1151 Type 2 diabetes mellitus with diabetic peripheral angiopathy without gangrene: Secondary | ICD-10-CM | POA: Diagnosis not present

## 2022-02-26 DIAGNOSIS — E1122 Type 2 diabetes mellitus with diabetic chronic kidney disease: Secondary | ICD-10-CM | POA: Diagnosis not present

## 2022-02-26 DIAGNOSIS — I2510 Atherosclerotic heart disease of native coronary artery without angina pectoris: Secondary | ICD-10-CM | POA: Diagnosis not present

## 2022-02-26 DIAGNOSIS — I129 Hypertensive chronic kidney disease with stage 1 through stage 4 chronic kidney disease, or unspecified chronic kidney disease: Secondary | ICD-10-CM | POA: Diagnosis not present

## 2022-02-26 DIAGNOSIS — Z48812 Encounter for surgical aftercare following surgery on the circulatory system: Secondary | ICD-10-CM | POA: Diagnosis not present

## 2022-02-26 DIAGNOSIS — I739 Peripheral vascular disease, unspecified: Secondary | ICD-10-CM | POA: Diagnosis not present

## 2022-03-02 ENCOUNTER — Ambulatory Visit (HOSPITAL_COMMUNITY)
Admission: RE | Admit: 2022-03-02 | Discharge: 2022-03-02 | Disposition: A | Discharge status: Home / Self Care | Payer: Medicare Other | Source: Ambulatory Visit | Attending: Surgery | Admitting: Surgery

## 2022-03-02 ENCOUNTER — Other Ambulatory Visit: Payer: Self-pay

## 2022-03-02 ENCOUNTER — Encounter: Payer: Self-pay | Admitting: Surgery

## 2022-03-02 ENCOUNTER — Ambulatory Visit (INDEPENDENT_AMBULATORY_CARE_PROVIDER_SITE_OTHER): Payer: Medicare Other | Admitting: Surgery

## 2022-03-02 ENCOUNTER — Inpatient Hospital Stay: Payer: Medicare Other | Admitting: Adult Health

## 2022-03-02 VITALS — BP 152/80 | HR 100 | Temp 97.9°F | Resp 20 | Ht 61.0 in | Wt 109.0 lb

## 2022-03-02 DIAGNOSIS — I2510 Atherosclerotic heart disease of native coronary artery without angina pectoris: Secondary | ICD-10-CM | POA: Diagnosis not present

## 2022-03-02 DIAGNOSIS — Z7901 Long term (current) use of anticoagulants: Secondary | ICD-10-CM | POA: Diagnosis not present

## 2022-03-02 DIAGNOSIS — K219 Gastro-esophageal reflux disease without esophagitis: Secondary | ICD-10-CM | POA: Diagnosis not present

## 2022-03-02 DIAGNOSIS — I480 Paroxysmal atrial fibrillation: Secondary | ICD-10-CM | POA: Diagnosis not present

## 2022-03-02 DIAGNOSIS — D509 Iron deficiency anemia, unspecified: Secondary | ICD-10-CM | POA: Diagnosis not present

## 2022-03-02 DIAGNOSIS — I6523 Occlusion and stenosis of bilateral carotid arteries: Secondary | ICD-10-CM

## 2022-03-02 DIAGNOSIS — I779 Disorder of arteries and arterioles, unspecified: Secondary | ICD-10-CM | POA: Diagnosis not present

## 2022-03-02 DIAGNOSIS — Z7984 Long term (current) use of oral hypoglycemic drugs: Secondary | ICD-10-CM | POA: Diagnosis not present

## 2022-03-02 DIAGNOSIS — I739 Peripheral vascular disease, unspecified: Secondary | ICD-10-CM | POA: Diagnosis not present

## 2022-03-02 DIAGNOSIS — J449 Chronic obstructive pulmonary disease, unspecified: Secondary | ICD-10-CM | POA: Diagnosis not present

## 2022-03-02 DIAGNOSIS — I70248 Atherosclerosis of native arteries of left leg with ulceration of other part of lower left leg: Secondary | ICD-10-CM

## 2022-03-02 DIAGNOSIS — I130 Hypertensive heart and chronic kidney disease with heart failure and stage 1 through stage 4 chronic kidney disease, or unspecified chronic kidney disease: Secondary | ICD-10-CM | POA: Diagnosis not present

## 2022-03-02 DIAGNOSIS — I5022 Chronic systolic (congestive) heart failure: Secondary | ICD-10-CM | POA: Diagnosis not present

## 2022-03-02 DIAGNOSIS — E43 Unspecified severe protein-calorie malnutrition: Secondary | ICD-10-CM | POA: Diagnosis not present

## 2022-03-02 DIAGNOSIS — E1122 Type 2 diabetes mellitus with diabetic chronic kidney disease: Secondary | ICD-10-CM | POA: Diagnosis not present

## 2022-03-02 DIAGNOSIS — Z91148 Patient's other noncompliance with medication regimen for other reason: Secondary | ICD-10-CM | POA: Diagnosis not present

## 2022-03-02 DIAGNOSIS — E785 Hyperlipidemia, unspecified: Secondary | ICD-10-CM | POA: Diagnosis not present

## 2022-03-02 DIAGNOSIS — N1831 Chronic kidney disease, stage 3a: Secondary | ICD-10-CM | POA: Diagnosis not present

## 2022-03-02 DIAGNOSIS — Z91198 Patient's noncompliance with other medical treatment and regimen for other reason: Secondary | ICD-10-CM | POA: Diagnosis not present

## 2022-03-02 DIAGNOSIS — Z8701 Personal history of pneumonia (recurrent): Secondary | ICD-10-CM | POA: Diagnosis not present

## 2022-03-02 DIAGNOSIS — R1312 Dysphagia, oropharyngeal phase: Secondary | ICD-10-CM | POA: Diagnosis not present

## 2022-03-02 DIAGNOSIS — Z794 Long term (current) use of insulin: Secondary | ICD-10-CM | POA: Diagnosis not present

## 2022-03-02 DIAGNOSIS — E1151 Type 2 diabetes mellitus with diabetic peripheral angiopathy without gangrene: Secondary | ICD-10-CM | POA: Diagnosis not present

## 2022-03-02 NOTE — Progress Notes (Signed)
[facility] [place]  Patient name: Reginald Ayers MRN: [id number] DOB: 03-09-1951 Sex: male   REASON FOR VISIT:    Follow up  HISOTRY OF PRESENT ILLNESS:    Reginald Ayers is a 71 y.o. male who is well-known to me, having undergone the following procedures: 09/22/2016:Viabahn stenting of an occluded right superficial femoral artery   12/08/2016 atherectomy with angioplasty, right posterior tibial artery   04/20/2017: Drug-coated balloon angioplasty, right superficial femoral artery, balloon angioplasty, right common femoral artery   09/07/2017: Diagnostic angiogram showing patent right superficial femoral artery stenting and flush occlusion of the left superficial femoral artery  He presented to the hospital in May 2023 with shortness of breath and weakness requiring intubation for respiratory failure.  He underwent a carotid duplex which showed a known left carotid occlusion but progression of the stenosis on the right side, greater than 80%.  Imaging revealed a left MCA distribution acute stroke.  It was felt that this was likely related to his chronic occlusion on the left and hypoperfusion.  Other potential etiologies were atrial fibrillation.  He ended up getting a CT angiogram showed a calcified lesion at the carotid bifurcation.  On 12/26/2021, he underwent right sided TCAR.  He was found to have a greater than 80% stenosis which was resolved after stenting.  Following the procedure, he was not moving his left side.  A code stroke was called.  He was taken to CT scan.  This showed his carotid stent was widely patent.  A MRI was negative for a new stroke.  His neurologic symptoms completely resolved.  This was not felt to be a perioperative neurologic insult, but likely secondary to hypoperfusion during stenting.  He is now back at home.  He is walking with a walker.  The wounds on his right leg appear to have improved.  Patient has a  history of coronary artery disease, status post CABG.  He is a diabetic.  He is medically managed for hypertension.  He is on a statin for hypercholesterolemia. PAST MEDICAL HISTORY:   Past Medical History:  Diagnosis Date   Carotid artery disease ([place])    L-ICA 100%, mod R-ICA dz   COPD (chronic obstructive pulmonary disease) (HCC)    Coronary artery disease    s/p CABG February 2010 by Dr. Merilynn Finland   Diabetes [facility])    Emphysema lung ([place])    Hyperlipidemia    Hypertension    Peripheral arterial disease ([place])    post left common iliac and right SFA stenting remotely   Tobacco abuse          FAMILY HISTORY:   Family History  Problem Relation Age of Onset   Heart attack Mother    CVA Mother    Heart disease Mother    Diabetes Sister    Hypertension Sister     SOCIAL HISTORY:   Social History   Tobacco Use   Smoking status: Former    Packs/day: 0.00    Types: Cigarettes    Quit date: 08/07/2016    Years since quitting: 5.5   Smokeless tobacco: Current    Types: Chew  Substance Use Topics   Alcohol use: No    Alcohol/week: 0.0 standard drinks of alcohol    Comment: Weekend drinker, quit 2000. NIGHTLY nyquill     ALLERGIES:   Allergies  Allergen Reactions   Actos [Pioglitazone] Shortness Of Breath    Leg swelling    Lipitor [Atorvastatin]  Other (See Comments)    Leg pain    Zestril [Lisinopril] Cough     CURRENT MEDICATIONS:   Current Outpatient Medications  Medication Sig Dispense Refill   albuterol (VENTOLIN HFA) 108 (90 Base) MCG/ACT inhaler Inhale 2 puffs into the lungs every 4 (four) hours as needed.     apixaban (ELIQUIS) 2.5 MG TABS tablet Take 1 tablet (2.5 mg total) by mouth 2 (two) times daily. 180 tablet 1   aspirin 81 MG EC tablet Take 1 tablet (81 mg total) by mouth daily. Swallow whole. (Patient taking differently: Take 81 mg by mouth daily.) 30 tablet 0   Azelastine-Fluticasone 137-50 MCG/ACT SUSP Place 1 spray into the nose in  the morning and at bedtime. 23 g 5   Blood Glucose Monitoring Suppl (BLOOD GLUCOSE MONITOR SYSTEM) w/Device KIT Used to test blood sugars 4 times daily 1 kit 0   clopidogrel (PLAVIX) 75 MG tablet Take 75 mg by mouth daily.     Cyanocobalamin (VITAMIN B-12 PO) Take 1 tablet by mouth daily.     empagliflozin (JARDIANCE) 25 MG TABS tablet Take 25 mg by mouth daily.     furosemide (LASIX) 20 MG tablet Take 20 mg by mouth daily.     insulin aspart (NOVOLOG) 100 UNIT/ML FlexPen Before each meal 3 times a day, 140-199 - 2 units, 200-250 - 4 units, 251-299 - 6 units,  300-349 - 8 units,  350 or above 10 units.  Insulin PEN if approved, provide syringes and needles if needed. (Patient taking differently: 0-10 Units 3 (three) times daily before meals. Per sliding scale: 140-199 - 2 units 200-250 - 4 units 251-299 - 6 units 300-349 - 8 units 350 or above 10 units.) 15 mL 0   Insulin Glargine (BASAGLAR KWIKPEN) 100 UNIT/ML Inject 15 Units into the skin daily. 15 mL 0   Insulin Pen Needle 32G X 4 MM MISC Use to inject insulin 4 times daily. 100 each 0   metFORMIN (GLUCOPHAGE) 500 MG tablet Take 1,000 mg by mouth 2 (two) times daily with a meal.     metoprolol succinate (TOPROL-XL) 25 MG 24 hr tablet TAKE 1 TABLET(25 MG) BY MOUTH DAILY 30 tablet 10   Multiple Vitamins-Minerals (CENTRUM SILVER 50+MEN) TABS Take 1 tablet by mouth daily.     NITROSTAT 0.4 MG SL tablet Take 0.4 mg by mouth every 5 (five) minutes as needed for chest pain.   0   pantoprazole (PROTONIX) 40 MG tablet Take 1 tablet (40 mg total) by mouth daily. 30 tablet 2   potassium chloride (K-DUR) 10 MEQ tablet Take 10 mEq by mouth daily.     rosuvastatin (CRESTOR) 20 MG tablet Take 1 tablet (20 mg total) by mouth daily. 30 tablet 0   SPIRIVA HANDIHALER 18 MCG inhalation capsule Place 18 mcg into inhaler and inhale daily at 8 pm.      No current facility-administered medications for this visit.    REVIEW OF SYSTEMS:   _0  denotes positive  finding, _1  denotes negative finding Cardiac  Comments:  Chest pain or chest pressure:    Shortness of breath upon exertion:    Short of breath when lying flat:    Irregular heart rhythm:        Vascular    Pain in calf, thigh, or hip brought on by ambulation:    Pain in feet at night that wakes you up from your sleep:     Blood clot in your veins:  Leg swelling:         Pulmonary    Oxygen at home:    Productive cough:     Wheezing:         Neurologic    Sudden weakness in arms or legs:     Sudden numbness in arms or legs:     Sudden onset of difficulty speaking or slurred speech:    Temporary loss of vision in one eye:     Problems with dizziness:         Gastrointestinal    Blood in stool:     Vomited blood:         Genitourinary    Burning when urinating:     Blood in urine:        Psychiatric    Major depression:         Hematologic    Bleeding problems:    Problems with blood clotting too easily:        Skin    Rashes or ulcers:        Constitutional    Fever or chills:      PHYSICAL EXAM:   Vitals:   03/02/22 1337 03/02/22 1339  BP: (!) 170/96 (!) 152/80  Pulse: 100   Resp: 20   Temp: 97.9 F (36.6 C)   SpO2: 93%   Weight: 109 lb (49.4 kg)   Height: _0  (1.549 m)     GENERAL: The patient is a well-nourished male, in no acute distress. The vital signs are documented above. CARDIAC: There is a regular rate and rhythm.  VASCULAR: Nonpalpable pedal pulses.  Bilateral edema. PULMONARY: Non-labored respirations MUSCULOSKELETAL: There are no major deformities or cyanosis. NEUROLOGIC: No focal weakness or paresthesias are detected. SKIN: There are no ulcers or rashes noted. PSYCHIATRIC: The patient has a normal affect.  STUDIES:   I have reviewed his ultrasound with the following findings: Right Carotid: There is no evidence of stenosis in the right stent. The  ECA                 appears >50% stenosed.   Left Carotid: Evidence  consistent with a total occlusion of the left ICA.   Vertebrals:  Bilateral vertebral arteries demonstrate antegrade flow.  Subclavians: Normal flow hemodynamics were seen in bilateral subclavian               arteries.   MEDICAL ISSUES:   Carotid: Stent is widely patent.  He will remain on aspirin and Plavix.  He will get a repeat ultrasound in 6 months  PAD: I am still concerned about blood flow to his right leg.  I want to make sure that he does not have any stenosis within his graft given his ulcers.  I am scheduling him for arteriogram in the next few weeks.  He does have a pressure wound/decubitus that is improving and so I would like to give this more time to heal before we proceed with angiography.  This would be through a left femoral approach with bilateral runoff, and intervention on the right leg if indicated.    Leia Alf, MD, FACS [facility] [phone] Pager [phone]

## 2022-03-02 NOTE — H&P (View-Only) (Signed)
 [facility] [place]  Patient name: Reginald Ayers MRN: [id number] DOB: 05/03/1951 Sex: male   REASON FOR VISIT:    Follow up  HISOTRY OF PRESENT ILLNESS:    Reginald Ayers is a 71 y.o. male who is well-known to me, having undergone the following procedures: 09/22/2016:Viabahn stenting of an occluded right superficial femoral artery   12/08/2016 atherectomy with angioplasty, right posterior tibial artery   04/20/2017: Drug-coated balloon angioplasty, right superficial femoral artery, balloon angioplasty, right common femoral artery   09/07/2017: Diagnostic angiogram showing patent right superficial femoral artery stenting and flush occlusion of the left superficial femoral artery  He presented to the hospital in May 2023 with shortness of breath and weakness requiring intubation for respiratory failure.  He underwent a carotid duplex which showed a known left carotid occlusion but progression of the stenosis on the right side, greater than 80%.  Imaging revealed a left MCA distribution acute stroke.  It was felt that this was likely related to his chronic occlusion on the left and hypoperfusion.  Other potential etiologies were atrial fibrillation.  He ended up getting a CT angiogram showed a calcified lesion at the carotid bifurcation.  On 12/26/2021, he underwent right sided TCAR.  He was found to have a greater than 80% stenosis which was resolved after stenting.  Following the procedure, he was not moving his left side.  A code stroke was called.  He was taken to CT scan.  This showed his carotid stent was widely patent.  A MRI was negative for a new stroke.  His neurologic symptoms completely resolved.  This was not felt to be a perioperative neurologic insult, but likely secondary to hypoperfusion during stenting.  He is now back at home.  He is walking with a walker.  The wounds on his right leg appear to have improved.  Patient has a  history of coronary artery disease, status post CABG.  He is a diabetic.  He is medically managed for hypertension.  He is on a statin for hypercholesterolemia. PAST MEDICAL HISTORY:   Past Medical History:  Diagnosis Date   Carotid artery disease (HCC)    L-ICA 100%, mod R-ICA dz   COPD (chronic obstructive pulmonary disease) (HCC)    Coronary artery disease    s/p CABG February 2010 by Dr. Steve Hendrickson   Diabetes (HCC)    Emphysema lung (HCC)    Hyperlipidemia    Hypertension    Peripheral arterial disease (HCC)    post left common iliac and right SFA stenting remotely   Tobacco abuse          FAMILY HISTORY:   Family History  Problem Relation Age of Onset   Heart attack Mother    CVA Mother    Heart disease Mother    Diabetes Sister    Hypertension Sister     SOCIAL HISTORY:   Social History   Tobacco Use   Smoking status: Former    Packs/day: 0.00    Types: Cigarettes    Quit date: 08/07/2016    Years since quitting: 5.5   Smokeless tobacco: Current    Types: Chew  Substance Use Topics   Alcohol use: No    Alcohol/week: 0.0 standard drinks of alcohol    Comment: Weekend drinker, quit 2000. NIGHTLY [place]quill     ALLERGIES:   Allergies  Allergen Reactions   Actos [Pioglitazone] Shortness Of Breath    Leg swelling    Lipitor [Atorvastatin]   Other (See Comments)    Leg pain    Zestril [Lisinopril] Cough     CURRENT MEDICATIONS:   Current Outpatient Medications  Medication Sig Dispense Refill   albuterol (VENTOLIN HFA) 108 (90 Base) MCG/ACT inhaler Inhale 2 puffs into the lungs every 4 (four) hours as needed.     apixaban (ELIQUIS) 2.5 MG TABS tablet Take 1 tablet (2.5 mg total) by mouth 2 (two) times daily. 180 tablet 1   aspirin 81 MG EC tablet Take 1 tablet (81 mg total) by mouth daily. Swallow whole. (Patient taking differently: Take 81 mg by mouth daily.) 30 tablet 0   Azelastine-Fluticasone 137-50 MCG/ACT SUSP Place 1 spray into the nose in  the morning and at bedtime. 23 g 5   Blood Glucose Monitoring Suppl (BLOOD GLUCOSE MONITOR SYSTEM) w/Device KIT Used to test blood sugars 4 times daily 1 kit 0   clopidogrel (PLAVIX) 75 MG tablet Take 75 mg by mouth daily.     Cyanocobalamin (VITAMIN B-12 PO) Take 1 tablet by mouth daily.     empagliflozin (JARDIANCE) 25 MG TABS tablet Take 25 mg by mouth daily.     furosemide (LASIX) 20 MG tablet Take 20 mg by mouth daily.     insulin aspart (NOVOLOG) 100 UNIT/ML FlexPen Before each meal 3 times a day, 140-199 - 2 units, 200-250 - 4 units, 251-299 - 6 units,  300-349 - 8 units,  350 or above 10 units.  Insulin PEN if approved, provide syringes and needles if needed. (Patient taking differently: 0-10 Units 3 (three) times daily before meals. Per sliding scale: 140-199 - 2 units 200-250 - 4 units 251-299 - 6 units 300-349 - 8 units 350 or above 10 units.) 15 mL 0   Insulin Glargine (BASAGLAR KWIKPEN) 100 UNIT/ML Inject 15 Units into the skin daily. 15 mL 0   Insulin Pen Needle 32G X 4 MM MISC Use to inject insulin 4 times daily. 100 each 0   metFORMIN (GLUCOPHAGE) 500 MG tablet Take 1,000 mg by mouth 2 (two) times daily with a meal.     metoprolol succinate (TOPROL-XL) 25 MG 24 hr tablet TAKE 1 TABLET(25 MG) BY MOUTH DAILY 30 tablet 10   Multiple Vitamins-Minerals (CENTRUM SILVER 50+MEN) TABS Take 1 tablet by mouth daily.     NITROSTAT 0.4 MG SL tablet Take 0.4 mg by mouth every 5 (five) minutes as needed for chest pain.   0   pantoprazole (PROTONIX) 40 MG tablet Take 1 tablet (40 mg total) by mouth daily. 30 tablet 2   potassium chloride (K-DUR) 10 MEQ tablet Take 10 mEq by mouth daily.     rosuvastatin (CRESTOR) 20 MG tablet Take 1 tablet (20 mg total) by mouth daily. 30 tablet 0   SPIRIVA HANDIHALER 18 MCG inhalation capsule Place 18 mcg into inhaler and inhale daily at 8 pm.      No current facility-administered medications for this visit.    REVIEW OF SYSTEMS:   [X] denotes positive  finding, [ ] denotes negative finding Cardiac  Comments:  Chest pain or chest pressure:    Shortness of breath upon exertion:    Short of breath when lying flat:    Irregular heart rhythm:        Vascular    Pain in calf, thigh, or hip brought on by ambulation:    Pain in feet at night that wakes you up from your sleep:     Blood clot in your veins:      Leg swelling:         Pulmonary    Oxygen at home:    Productive cough:     Wheezing:         Neurologic    Sudden weakness in arms or legs:     Sudden numbness in arms or legs:     Sudden onset of difficulty speaking or slurred speech:    Temporary loss of vision in one eye:     Problems with dizziness:         Gastrointestinal    Blood in stool:     Vomited blood:         Genitourinary    Burning when urinating:     Blood in urine:        Psychiatric    Major depression:         Hematologic    Bleeding problems:    Problems with blood clotting too easily:        Skin    Rashes or ulcers:        Constitutional    Fever or chills:      PHYSICAL EXAM:   Vitals:   03/02/22 1337 03/02/22 1339  BP: (!) 170/96 (!) 152/80  Pulse: 100   Resp: 20   Temp: 97.9 F (36.6 C)   SpO2: 93%   Weight: 109 lb (49.4 kg)   Height: 5' 1" (1.549 m)     GENERAL: The patient is a well-nourished male, in no acute distress. The vital signs are documented above. CARDIAC: There is a regular rate and rhythm.  VASCULAR: Nonpalpable pedal pulses.  Bilateral edema. PULMONARY: Non-labored respirations MUSCULOSKELETAL: There are no major deformities or cyanosis. NEUROLOGIC: No focal weakness or paresthesias are detected. SKIN: There are no ulcers or rashes noted. PSYCHIATRIC: The patient has a normal affect.  STUDIES:   I have reviewed his ultrasound with the following findings: Right Carotid: There is no evidence of stenosis in the right stent. The  ECA                 appears >50% stenosed.   Left Carotid: Evidence  consistent with a total occlusion of the left ICA.   Vertebrals:  Bilateral vertebral arteries demonstrate antegrade flow.  Subclavians: Normal flow hemodynamics were seen in bilateral subclavian               arteries.   MEDICAL ISSUES:   Carotid: Stent is widely patent.  He will remain on aspirin and Plavix.  He will get a repeat ultrasound in 6 months  PAD: I am still concerned about blood flow to his right leg.  I want to make sure that he does not have any stenosis within his graft given his ulcers.  I am scheduling him for arteriogram in the next few weeks.  He does have a pressure wound/decubitus that is improving and so I would like to give this more time to heal before we proceed with angiography.  This would be through a left femoral approach with bilateral runoff, and intervention on the right leg if indicated.    Wells Inetha Maret, IV, MD, FACS [facility] [place] Tel [phone] Pager [phone]  

## 2022-03-05 ENCOUNTER — Ambulatory Visit
Admission: RE | Admit: 2022-03-05 | Discharge: 2022-03-05 | Disposition: A | Discharge status: Home / Self Care | Payer: Medicare Other | Source: Ambulatory Visit | Attending: Internal Medicine | Admitting: Internal Medicine

## 2022-03-05 ENCOUNTER — Other Ambulatory Visit: Payer: Self-pay | Admitting: Internal Medicine

## 2022-03-05 DIAGNOSIS — D649 Anemia, unspecified: Secondary | ICD-10-CM | POA: Diagnosis not present

## 2022-03-05 DIAGNOSIS — I130 Hypertensive heart and chronic kidney disease with heart failure and stage 1 through stage 4 chronic kidney disease, or unspecified chronic kidney disease: Secondary | ICD-10-CM | POA: Diagnosis not present

## 2022-03-05 DIAGNOSIS — R059 Cough, unspecified: Secondary | ICD-10-CM

## 2022-03-05 DIAGNOSIS — N1831 Chronic kidney disease, stage 3a: Secondary | ICD-10-CM | POA: Diagnosis not present

## 2022-03-05 DIAGNOSIS — I5022 Chronic systolic (congestive) heart failure: Secondary | ICD-10-CM | POA: Diagnosis not present

## 2022-03-05 DIAGNOSIS — R0602 Shortness of breath: Secondary | ICD-10-CM | POA: Diagnosis not present

## 2022-03-05 DIAGNOSIS — Z794 Long term (current) use of insulin: Secondary | ICD-10-CM | POA: Diagnosis not present

## 2022-03-05 DIAGNOSIS — Z7984 Long term (current) use of oral hypoglycemic drugs: Secondary | ICD-10-CM | POA: Diagnosis not present

## 2022-03-05 DIAGNOSIS — I10 Essential (primary) hypertension: Secondary | ICD-10-CM | POA: Diagnosis not present

## 2022-03-05 DIAGNOSIS — E1122 Type 2 diabetes mellitus with diabetic chronic kidney disease: Secondary | ICD-10-CM | POA: Diagnosis not present

## 2022-03-10 DIAGNOSIS — I130 Hypertensive heart and chronic kidney disease with heart failure and stage 1 through stage 4 chronic kidney disease, or unspecified chronic kidney disease: Secondary | ICD-10-CM | POA: Diagnosis not present

## 2022-03-10 DIAGNOSIS — E1122 Type 2 diabetes mellitus with diabetic chronic kidney disease: Secondary | ICD-10-CM | POA: Diagnosis not present

## 2022-03-10 DIAGNOSIS — Z794 Long term (current) use of insulin: Secondary | ICD-10-CM | POA: Diagnosis not present

## 2022-03-10 DIAGNOSIS — I5022 Chronic systolic (congestive) heart failure: Secondary | ICD-10-CM | POA: Diagnosis not present

## 2022-03-10 DIAGNOSIS — N1831 Chronic kidney disease, stage 3a: Secondary | ICD-10-CM | POA: Diagnosis not present

## 2022-03-10 DIAGNOSIS — Z7984 Long term (current) use of oral hypoglycemic drugs: Secondary | ICD-10-CM | POA: Diagnosis not present

## 2022-03-17 ENCOUNTER — Other Ambulatory Visit (HOSPITAL_COMMUNITY): Payer: Self-pay

## 2022-03-19 DIAGNOSIS — E11319 Type 2 diabetes mellitus with unspecified diabetic retinopathy without macular edema: Secondary | ICD-10-CM | POA: Diagnosis not present

## 2022-03-19 DIAGNOSIS — J189 Pneumonia, unspecified organism: Secondary | ICD-10-CM | POA: Diagnosis not present

## 2022-03-19 DIAGNOSIS — E1121 Type 2 diabetes mellitus with diabetic nephropathy: Secondary | ICD-10-CM | POA: Diagnosis not present

## 2022-03-19 DIAGNOSIS — E1151 Type 2 diabetes mellitus with diabetic peripheral angiopathy without gangrene: Secondary | ICD-10-CM | POA: Diagnosis not present

## 2022-03-19 DIAGNOSIS — I480 Paroxysmal atrial fibrillation: Secondary | ICD-10-CM | POA: Diagnosis not present

## 2022-03-19 DIAGNOSIS — I639 Cerebral infarction, unspecified: Secondary | ICD-10-CM | POA: Diagnosis not present

## 2022-03-19 DIAGNOSIS — E7800 Pure hypercholesterolemia, unspecified: Secondary | ICD-10-CM | POA: Diagnosis not present

## 2022-03-19 DIAGNOSIS — I739 Peripheral vascular disease, unspecified: Secondary | ICD-10-CM | POA: Diagnosis not present

## 2022-03-19 DIAGNOSIS — I2510 Atherosclerotic heart disease of native coronary artery without angina pectoris: Secondary | ICD-10-CM | POA: Diagnosis not present

## 2022-03-19 DIAGNOSIS — N1831 Chronic kidney disease, stage 3a: Secondary | ICD-10-CM | POA: Diagnosis not present

## 2022-03-20 DIAGNOSIS — E1122 Type 2 diabetes mellitus with diabetic chronic kidney disease: Secondary | ICD-10-CM | POA: Diagnosis not present

## 2022-03-20 DIAGNOSIS — N1831 Chronic kidney disease, stage 3a: Secondary | ICD-10-CM | POA: Diagnosis not present

## 2022-03-20 DIAGNOSIS — I130 Hypertensive heart and chronic kidney disease with heart failure and stage 1 through stage 4 chronic kidney disease, or unspecified chronic kidney disease: Secondary | ICD-10-CM | POA: Diagnosis not present

## 2022-03-20 DIAGNOSIS — Z7984 Long term (current) use of oral hypoglycemic drugs: Secondary | ICD-10-CM | POA: Diagnosis not present

## 2022-03-20 DIAGNOSIS — I5022 Chronic systolic (congestive) heart failure: Secondary | ICD-10-CM | POA: Diagnosis not present

## 2022-03-20 DIAGNOSIS — Z794 Long term (current) use of insulin: Secondary | ICD-10-CM | POA: Diagnosis not present

## 2022-03-25 DIAGNOSIS — N1831 Chronic kidney disease, stage 3a: Secondary | ICD-10-CM | POA: Diagnosis not present

## 2022-03-25 DIAGNOSIS — I130 Hypertensive heart and chronic kidney disease with heart failure and stage 1 through stage 4 chronic kidney disease, or unspecified chronic kidney disease: Secondary | ICD-10-CM | POA: Diagnosis not present

## 2022-03-25 DIAGNOSIS — Z7984 Long term (current) use of oral hypoglycemic drugs: Secondary | ICD-10-CM | POA: Diagnosis not present

## 2022-03-25 DIAGNOSIS — Z794 Long term (current) use of insulin: Secondary | ICD-10-CM | POA: Diagnosis not present

## 2022-03-25 DIAGNOSIS — E1122 Type 2 diabetes mellitus with diabetic chronic kidney disease: Secondary | ICD-10-CM | POA: Diagnosis not present

## 2022-03-25 DIAGNOSIS — I5022 Chronic systolic (congestive) heart failure: Secondary | ICD-10-CM | POA: Diagnosis not present

## 2022-03-31 ENCOUNTER — Ambulatory Visit (HOSPITAL_COMMUNITY)
Admission: RE | Disposition: A | Discharge status: Home / Self Care | Payer: Self-pay | Source: Home / Self Care | Attending: Surgery

## 2022-03-31 ENCOUNTER — Ambulatory Visit (HOSPITAL_COMMUNITY)
Admission: RE | Admit: 2022-03-31 | Discharge: 2022-03-31 | Disposition: A | Discharge status: Home / Self Care | Payer: Medicare Other | Attending: Surgery | Admitting: Surgery

## 2022-03-31 ENCOUNTER — Telehealth: Payer: Self-pay | Admitting: Surgery

## 2022-03-31 DIAGNOSIS — E7800 Pure hypercholesterolemia, unspecified: Secondary | ICD-10-CM | POA: Insufficient documentation

## 2022-03-31 DIAGNOSIS — Z7982 Long term (current) use of aspirin: Secondary | ICD-10-CM | POA: Diagnosis not present

## 2022-03-31 DIAGNOSIS — Z87891 Personal history of nicotine dependence: Secondary | ICD-10-CM | POA: Diagnosis not present

## 2022-03-31 DIAGNOSIS — Z951 Presence of aortocoronary bypass graft: Secondary | ICD-10-CM | POA: Insufficient documentation

## 2022-03-31 DIAGNOSIS — Z7984 Long term (current) use of oral hypoglycemic drugs: Secondary | ICD-10-CM | POA: Diagnosis not present

## 2022-03-31 DIAGNOSIS — Z7902 Long term (current) use of antithrombotics/antiplatelets: Secondary | ICD-10-CM | POA: Diagnosis not present

## 2022-03-31 DIAGNOSIS — L97919 Non-pressure chronic ulcer of unspecified part of right lower leg with unspecified severity: Secondary | ICD-10-CM | POA: Insufficient documentation

## 2022-03-31 DIAGNOSIS — E1151 Type 2 diabetes mellitus with diabetic peripheral angiopathy without gangrene: Secondary | ICD-10-CM | POA: Insufficient documentation

## 2022-03-31 DIAGNOSIS — I70239 Atherosclerosis of native arteries of right leg with ulceration of unspecified site: Secondary | ICD-10-CM | POA: Diagnosis not present

## 2022-03-31 DIAGNOSIS — E11622 Type 2 diabetes mellitus with other skin ulcer: Secondary | ICD-10-CM | POA: Diagnosis not present

## 2022-03-31 DIAGNOSIS — I6523 Occlusion and stenosis of bilateral carotid arteries: Secondary | ICD-10-CM | POA: Diagnosis not present

## 2022-03-31 DIAGNOSIS — Z794 Long term (current) use of insulin: Secondary | ICD-10-CM | POA: Diagnosis not present

## 2022-03-31 DIAGNOSIS — I10 Essential (primary) hypertension: Secondary | ICD-10-CM | POA: Insufficient documentation

## 2022-03-31 DIAGNOSIS — I2510 Atherosclerotic heart disease of native coronary artery without angina pectoris: Secondary | ICD-10-CM | POA: Insufficient documentation

## 2022-03-31 DIAGNOSIS — I70248 Atherosclerosis of native arteries of left leg with ulceration of other part of lower left leg: Secondary | ICD-10-CM

## 2022-03-31 HISTORY — PX: ABDOMINAL AORTOGRAM W/LOWER EXTREMITY: CATH118223

## 2022-03-31 HISTORY — PX: PERIPHERAL VASCULAR BALLOON ANGIOPLASTY: CATH118281

## 2022-03-31 LAB — GLUCOSE, CAPILLARY
Glucose-Capillary: 153 mg/dL — ABNORMAL HIGH (ref 70–99)
Glucose-Capillary: 147 mg/dL — ABNORMAL HIGH (ref 70–99)

## 2022-03-31 LAB — POCT I-STAT, CHEM 8
BUN: 50 mg/dL — ABNORMAL HIGH (ref 8–23)
Calcium, Ion: 1.31 mmol/L (ref 1.15–1.40)
Chloride: 106 mmol/L (ref 98–111)
Creatinine, Ser: 1.3 mg/dL — ABNORMAL HIGH (ref 0.61–1.24)
Glucose, Bld: 182 mg/dL — ABNORMAL HIGH (ref 70–99)
HCT: 34 % — ABNORMAL LOW (ref 39.0–52.0)
Hemoglobin: 11.6 g/dL — ABNORMAL LOW (ref 13.0–17.0)
Potassium: 5.6 mmol/L — ABNORMAL HIGH (ref 3.5–5.1)
Sodium: 135 mmol/L (ref 135–145)
TCO2: 21 mmol/L — ABNORMAL LOW (ref 22–32)

## 2022-03-31 LAB — POCT ACTIVATED CLOTTING TIME
Activated Clotting Time: 209 seconds
Activated Clotting Time: 203 seconds
Activated Clotting Time: 179 seconds

## 2022-03-31 SURGERY — ABDOMINAL AORTOGRAM W/LOWER EXTREMITY
Anesthesia: LOCAL

## 2022-03-31 MED ORDER — IODIXANOL 320 MG/ML IV SOLN
INTRAVENOUS | Status: DC | PRN
  Administered 2022-03-31: 173 mL via INTRA_ARTERIAL

## 2022-03-31 MED ORDER — SODIUM CHLORIDE 0.9 % WEIGHT BASED INFUSION
1.0000 mL/kg/h | INTRAVENOUS | Status: DC
Start: 2022-03-31 — End: 2022-03-31
  Administered 2022-03-31: 1 mL/kg/h via INTRAVENOUS

## 2022-03-31 MED ORDER — ASPIRIN 81 MG PO TBEC
81.0000 mg | DELAYED_RELEASE_TABLET | Freq: Every day | ORAL | Status: DC
  Administered 2022-03-31: 81 mg via ORAL
  Filled 2022-03-31: qty 1

## 2022-03-31 MED ORDER — MIDAZOLAM HCL 2 MG/2ML IJ SOLN
INTRAMUSCULAR | Status: DC | PRN
  Administered 2022-03-31 (×3): 1 mg via INTRAVENOUS

## 2022-03-31 MED ORDER — HYDRALAZINE HCL 20 MG/ML IJ SOLN
INTRAMUSCULAR | Status: AC
  Filled 2022-03-31: qty 1

## 2022-03-31 MED ORDER — FENTANYL CITRATE (PF) 100 MCG/2ML IJ SOLN
INTRAMUSCULAR | Status: AC
  Filled 2022-03-31: qty 2

## 2022-03-31 MED ORDER — HEPARIN SODIUM (PORCINE) 1000 UNIT/ML IJ SOLN
INTRAMUSCULAR | Status: AC
Start: 2022-03-31 — End: ?
  Filled 2022-03-31: qty 10

## 2022-03-31 MED ORDER — HEPARIN (PORCINE) IN NACL 1000-0.9 UT/500ML-% IV SOLN
INTRAVENOUS | Status: DC | PRN
  Administered 2022-03-31 (×2): 500 mL

## 2022-03-31 MED ORDER — MIDAZOLAM HCL 2 MG/2ML IJ SOLN
INTRAMUSCULAR | Status: AC
  Filled 2022-03-31: qty 2

## 2022-03-31 MED ORDER — CLOPIDOGREL BISULFATE 75 MG PO TABS
75.0000 mg | ORAL_TABLET | Freq: Every day | ORAL | Status: DC
Start: 2022-04-01 — End: 2022-03-31
  Administered 2022-03-31: 75 mg via ORAL
  Filled 2022-03-31: qty 1

## 2022-03-31 MED ORDER — OXYCODONE HCL 5 MG PO TABS: 5.0000 mg | ORAL_TABLET | ORAL | Status: DC | PRN

## 2022-03-31 MED ORDER — LABETALOL HCL 5 MG/ML IV SOLN: 10.0000 mg | INTRAVENOUS | Status: DC | PRN

## 2022-03-31 MED ORDER — SODIUM CHLORIDE 0.9 % IV SOLN: 250.0000 mL | INTRAVENOUS | Status: DC | PRN

## 2022-03-31 MED ORDER — SODIUM CHLORIDE 0.9% FLUSH
3.0000 mL | Freq: Two times a day (BID) | INTRAVENOUS | Status: DC
Start: 2022-03-31 — End: 2022-03-31

## 2022-03-31 MED ORDER — HEPARIN (PORCINE) IN NACL 1000-0.9 UT/500ML-% IV SOLN
INTRAVENOUS | Status: AC
  Filled 2022-03-31: qty 1000

## 2022-03-31 MED ORDER — MORPHINE SULFATE (PF) 2 MG/ML IV SOLN: 2.0000 mg | INTRAVENOUS | Status: DC | PRN

## 2022-03-31 MED ORDER — SODIUM CHLORIDE 0.9% FLUSH: 3.0000 mL | INTRAVENOUS | Status: DC | PRN

## 2022-03-31 MED ORDER — ONDANSETRON HCL 4 MG/2ML IJ SOLN: 4.0000 mg | Freq: Four times a day (QID) | INTRAMUSCULAR | Status: DC | PRN

## 2022-03-31 MED ORDER — ACETAMINOPHEN 325 MG PO TABS: 650.0000 mg | ORAL_TABLET | ORAL | Status: DC | PRN

## 2022-03-31 MED ORDER — FENTANYL CITRATE (PF) 100 MCG/2ML IJ SOLN
INTRAMUSCULAR | Status: DC | PRN
  Administered 2022-03-31: 50 ug via INTRAVENOUS
  Administered 2022-03-31 (×3): 25 ug via INTRAVENOUS

## 2022-03-31 MED ORDER — LIDOCAINE HCL (PF) 1 % IJ SOLN
INTRAMUSCULAR | Status: DC | PRN
  Administered 2022-03-31: 20 mL via INTRADERMAL

## 2022-03-31 MED ORDER — SODIUM CHLORIDE 0.9 % IV SOLN: INTRAVENOUS | Status: DC

## 2022-03-31 MED ORDER — HYDRALAZINE HCL 20 MG/ML IJ SOLN
5.0000 mg | INTRAMUSCULAR | Status: DC | PRN
  Administered 2022-03-31: 5 mg via INTRAVENOUS

## 2022-03-31 MED ORDER — LIDOCAINE HCL (PF) 1 % IJ SOLN
INTRAMUSCULAR | Status: AC
Start: 2022-03-31 — End: ?
  Filled 2022-03-31: qty 30

## 2022-03-31 SURGICAL SUPPLY — 16 items
BAG SNAP BAND KOVER 36X36 (MISCELLANEOUS) ×1 IMPLANT
BALLN STERLING OTW 3X100X150 (BALLOONS) ×3
BALLOON STERLING OTW 3X100X150 (BALLOONS) IMPLANT
CATH OMNI FLUSH 5F 65CM (CATHETERS) ×1 IMPLANT
COVER DOME SNAP 22 D (MISCELLANEOUS) ×1 IMPLANT
KIT ENCORE 26 ADVANTAGE (KITS) ×1 IMPLANT
KIT MICROPUNCTURE NIT STIFF (SHEATH) ×1 IMPLANT
KIT PV (KITS) ×4 IMPLANT
SHEATH DESTINATION MP 5FR 45CM (SHEATH) ×1 IMPLANT
SHEATH PINNACLE 5F 10CM (SHEATH) ×1 IMPLANT
SHEATH PROBE COVER 6X72 (BAG) ×1 IMPLANT
SYR MEDRAD MARK V 150ML (SYRINGE) ×1 IMPLANT
TRANSDUCER W/STOPCOCK (MISCELLANEOUS) ×4 IMPLANT
TRAY PV CATH (CUSTOM PROCEDURE TRAY) ×4 IMPLANT
WIRE BENTSON .035X145CM (WIRE) ×1 IMPLANT
WIRE G V18X300CM (WIRE) ×2 IMPLANT

## 2022-03-31 NOTE — Progress Notes (Signed)
Paged Dr Myra Gianotti for order clarification, pt to take plavix 75 mg one dose today with asa 81 mg, while in PSS then start eliquis tomorrow.

## 2022-03-31 NOTE — Op Note (Signed)
Patient name: Reginald Ayers MRN: [id number] DOB: Dec 30, 1950 Sex: male  03/31/2022 Pre-operative Diagnosis: Right leg ulcer Post-operative diagnosis:  Same Surgeon:  [name] Procedure Performed:  1.  Ultrasound-guided access, left femoral artery  2.  Abdominal aortogram  3.  Bilateral lower extremity runoff  4.  Angioplasty, right posterior tibial artery  5.  Conscious sedation, 73 minutes  WIFI:  W1. I0, F0 Indications: This is a 71 year old gentleman with nonhealing right leg ulcer and multiple vascular issues.  He is here today for evaluation.  He has previously undergone multiple percutaneous interventions.  Procedure:  The patient was identified in the holding area and taken to room 8.  The patient was then placed supine on the table and prepped and draped in the usual sterile fashion.  A time out was called.  Conscious sedation was administered with the use of IV fentanyl and Versed under continuous physician and nurse monitoring.  Heart rate, blood pressure, and oxygen saturation were continuously monitored.  Total sedation time was 73 minutes.  Ultrasound was used to evaluate the left common femoral artery.  It was patent .  A digital ultrasound image was acquired.  A micropuncture needle was used to access the left common femoral artery under ultrasound guidance.  An 018 wire was advanced without resistance and a micropuncture sheath was placed.  The 018 wire was removed and a benson wire was placed.  The micropuncture sheath was exchanged for a 5 french sheath.  An omniflush catheter was advanced over the wire to the level of L-1.  An abdominal angiogram was obtained.  Next, using the omniflush catheter and a benson wire, the aortic bifurcation was crossed and the catheter was placed into theright external iliac artery and right runoff was obtained.  left runoff was performed via retrograde sheath injections.  Findings:   Aortogram: No significant renal artery stenosis was  identified.  The infrarenal abdominal aorta widely patent without significant stenosis.  Bilateral common and external iliac arteries are widely patent.  Right Lower Extremity: The right common femoral profundofemoral artery are patent throughout the course.  The superficial femoral artery is widely patent as are its associated stents.  The popliteal artery is widely patent.  There is single-vessel runoff via the peroneal artery with reconstitution of the posterior tibial artery at the ankle  Left Lower Extremity: There is flush occlusion of the left superficial femoral artery  Intervention: After the above images were acquired the decision made to proceed with intervention.  A 5 French 45 cm sheath was advanced into the right superficial femoral artery and the patient was fully heparinized.  Using a V-18 wire and a 3 x 100 Sterling balloon, I gained access into the posterior tibial artery and then performed recanalization and subsequent balloon angioplasty with serial inflations using the 3 x 100 balloon.  Completion imaging was then performed which showed two-vessel runoff now via the peroneal and posterior tibial artery.  There was a irregularity within the posterior tibial artery in the midportion for which I repeated balloon angioplasty at a long inflation.  Completion imaging showed resolution of this process which was likely a small dissection.  Catheters and wires were then removed.  The patient taken holding it for sheath pull once his coagulation profile corrects.  Impression:  #1  Patent right superficial femoral artery and its associated stents with single-vessel runoff via the peroneal artery and recanalization of the posterior tibial artery at the ankle  #2  Successful recanalization  of the right posterior tibial artery and subsequent balloon angioplasty using a 3 mm balloon with no residual stenosis  #3  Flush occlusion of the left superficial femoral artery.   Juleen China, M.D.,  [facility] [place] Office: [phone] Pager:  [phone]

## 2022-03-31 NOTE — Progress Notes (Signed)
Hemostasis achieved to Lt femoral artery. Manual pressure held for 30 minutes per Nathan,RN with Temiloluwa Recchia,RN at bedside. VSS. Patient resting comfortable. Post procedure bleeding precautions reviewed with patient. 4x4 and tegaderm applied to Lt groin. Patient being transported to Short Stay at this time.

## 2022-03-31 NOTE — Progress Notes (Signed)
4F Sheath removed from Left Femoral Artery. Manual pressure applied by Gaspar Cola (2H RN). Janett Labella instructing Harrold Donath on sheath removal ans standing at bedside throughout sheath pull. Patient remains comfortable. Will continue to monitor.

## 2022-03-31 NOTE — Telephone Encounter (Signed)
-----   Message from Nada Libman, MD sent at 03/31/2022 11:01 AM EDT ----- 03/31/2022:  Surgeon:  [name] Procedure Performed:  1.  Ultrasound-guided access, left femoral artery  2.  Abdominal aortogram  3.  Bilateral lower extremity runoff  4.  Angioplasty, right posterior tibial artery  5.  Conscious sedation, 71 minutes  Office follow-up in 1 month in [facility] with ABI and right leg duplex

## 2022-03-31 NOTE — Interval H&P Note (Signed)
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  The various methods of treatment have been discussed with the patient and family. After consideration of risks, benefits and other options for treatment, the patient has consented to  Procedure(s): ABDOMINAL AORTOGRAM W/LOWER EXTREMITY (N/A) as a surgical intervention.  The patient's history has been reviewed, patient examined, no change in status, stable for surgery.  I have reviewed the patient's chart and labs.  Questions were answered to the patient's satisfaction.     [name]

## 2022-03-31 NOTE — Progress Notes (Addendum)
Report and care received from [facility]. Patient resting comfortably. VSS. Lt groin remains soft with no active bleeding or hematoma noted. Will continue to monitor patient.

## 2022-04-01 ENCOUNTER — Encounter (HOSPITAL_COMMUNITY): Payer: Self-pay | Admitting: Surgery

## 2022-04-01 MED FILL — Heparin Sodium (Porcine) Inj 1000 Unit/ML: INTRAMUSCULAR | Qty: 10 | Status: AC

## 2022-04-15 ENCOUNTER — Ambulatory Visit (INDEPENDENT_AMBULATORY_CARE_PROVIDER_SITE_OTHER): Payer: Medicare Other | Admitting: Adult Health

## 2022-04-15 ENCOUNTER — Encounter: Payer: Self-pay | Admitting: Adult Health

## 2022-04-15 VITALS — BP 175/72 | HR 84 | Ht 61.0 in | Wt 108.0 lb

## 2022-04-15 DIAGNOSIS — I6389 Other cerebral infarction: Secondary | ICD-10-CM | POA: Diagnosis not present

## 2022-04-15 DIAGNOSIS — Z09 Encounter for follow-up examination after completed treatment for conditions other than malignant neoplasm: Secondary | ICD-10-CM | POA: Diagnosis not present

## 2022-04-15 NOTE — Patient Instructions (Addendum)
You are doing great from a stroke standpoint! Please continue to do exercises at home as advised by therapies   Continue to follow with vascular surgery and cardiology as advised   Continue  Eliquis   and Crestor for secondary stroke prevention, restart aspirin 81mg  daily   Continue to follow up with PCP regarding blood pressure, cholesterol and diabetes management  Maintain strict control of hypertension with blood pressure goal below 130/90, diabetes with hemoglobin A1c goal below 7.0 % and cholesterol with LDL cholesterol (bad cholesterol) goal below 70 mg/dL.   Signs of a Stroke? Follow the BEFAST method:  Balance Watch for a sudden loss of balance, trouble with coordination or vertigo Eyes Is there a sudden loss of vision in one or both eyes? Or double vision?  Face: Ask the person to smile. Does one side of the face droop or is it numb?  Arms: Ask the person to raise both arms. Does one arm drift downward? Is there weakness or numbness of a leg? Speech: Ask the person to repeat a simple phrase. Does the speech sound slurred/strange? Is the person confused ? Time: If you observe any of these signs, call 911.         Thank you for coming to see at [facility]. I hope we have been able to provide you high quality care today.  You may receive a patient satisfaction survey over the next few weeks. We would appreciate your feedback and comments so that we may continue to improve ourselves and the health of our patients.     Stroke Prevention Some medical conditions and lifestyle choices can lead to a higher risk for a stroke. You can help to prevent a stroke by eating healthy foods and exercising. It also helps to not smoke and to manage any health problems you may have. How can this condition affect me? A stroke is an emergency. It should be treated right away. A stroke can lead to brain damage or threaten your life. There is a better chance of surviving and  getting better after a stroke if you get medical help right away. What can increase my risk? The following medical conditions may increase your risk of a stroke: Diseases of the heart and blood vessels (cardiovascular disease). High blood pressure (hypertension). Diabetes. High cholesterol. Sickle cell disease. Problems with blood clotting. Being very overweight. Sleeping problems (obstructivesleep apnea). Other risk factors include: Being older than age 22. A history of blood clots, stroke, or mini-stroke (TIA). Race, ethnic background, or a family history of stroke. Smoking or using tobacco products. Taking birth control pills, especially if you smoke. Heavy alcohol and drug use. Not being active. What actions can I take to prevent this? Manage your health conditions High cholesterol. Eat a healthy diet. If this is not enough to manage your cholesterol, you may need to take medicines. Take medicines as told by your doctor. High blood pressure. Try to keep your blood pressure below 130/80. If your blood pressure cannot be managed through a healthy diet and regular exercise, you may need to take medicines. Take medicines as told by your doctor. Ask your doctor if you should check your blood pressure at home. Have your blood pressure checked every year. Diabetes. Eat a healthy diet and get regular exercise. If your blood sugar (glucose) cannot be managed through diet and exercise, you may need to take medicines. Take medicines as told by your doctor. Talk to your doctor about getting checked for  sleeping problems. Signs of a problem can include: Snoring a lot. Feeling very tired. Make sure that you manage any other conditions you have. Nutrition  Follow instructions from your doctor about what to eat or drink. You may be told to: Eat and drink fewer calories each day. Limit how much salt (sodium) you use to 1,500 milligrams (mg) each day. Use only healthy fats for cooking,  such as olive oil, canola oil, and sunflower oil. Eat healthy foods. To do this: Choose foods that are high in fiber. These include whole grains, and fresh fruits and vegetables. Eat at least 5 servings of fruits and vegetables a day. Try to fill one-half of your plate with fruits and vegetables at each meal. Choose low-fat (lean) proteins. These include low-fat cuts of meat, chicken without skin, fish, tofu, beans, and nuts. Eat low-fat dairy products. Avoid foods that: Are high in salt. Have saturated fat. Have trans fat. Have cholesterol. Are processed or pre-made. Count how many carbohydrates you eat and drink each day. Lifestyle If you drink alcohol: Limit how much you have to: 0-1 drink a day for women who are not pregnant. 0-2 drinks a day for men. Know how much alcohol is in your drink. In the [place] one drink equals one 12 oz bottle of beer ( ), one 5 oz glass of wine ( ), or one 1 oz glass of hard liquor (53mL). Do not smoke or use any products that have nicotine or tobacco. If you need help quitting, ask your doctor. Avoid secondhand smoke. Do not use drugs. Activity  Try to stay at a healthy weight. Get at least 30 minutes of exercise on most days, such as: Fast walking. Biking. Swimming. Medicines Take over-the-counter and prescription medicines only as told by your doctor. Avoid taking birth control pills. Talk to your doctor about the risks of taking birth control pills if: You are over 10 years old. You smoke. You get very bad headaches. You have had a blood clot. Where to find more information American Stroke Association: www.strokeassociation.org Get help right away if: You or a loved one has any signs of a stroke. "BE FAST" is an easy way to remember the warning signs: B - Balance. Dizziness, sudden trouble walking, or loss of balance. E - Eyes. Trouble seeing or a change in how you see. F - Face. Sudden weakness or loss of feeling of the face. The  face or eyelid may droop on one side. A - Arms. Weakness or loss of feeling in an arm. This happens all of a sudden and most often on one side of the body. S - Speech. Sudden trouble speaking, slurred speech, or trouble understanding what people say. T - Time. Time to call emergency services. Write down what time symptoms started. You or a loved one has other signs of a stroke, such as: A sudden, very bad headache with no known cause. Feeling like you may vomit (nausea). Vomiting. A seizure. These symptoms may be an emergency. Get help right away. Call your local emergency services (911 in the [place] Do not wait to see if the symptoms will go away. Do not drive yourself to the hospital. Summary You can help to prevent a stroke by eating healthy, exercising, and not smoking. It also helps to manage any health problems you have. Do not smoke or use any products that contain nicotine or tobacco. Get help right away if you or a loved one has any signs of a stroke. This information is  not intended to replace advice given to you by your health care provider. Make sure you discuss any questions you have with your health care provider. Document Revised: 03/11/2020 Document Reviewed: 03/11/2020 Elsevier Patient Education  [place].

## 2022-04-15 NOTE — Progress Notes (Signed)
[facility] [address] [place]. [place] [address] [phone]       HOSPITAL FOLLOW UP NOTE  Mr. JOMO FORAND Date of Birth:  Oct 17, 1950 Medical Record Number:  [id number]   Reason for Referral:  hospital stroke follow up    SUBJECTIVE:   CHIEF COMPLAINT:  Chief Complaint  Patient presents with   Follow-up    Rm 3 with niece ernestine  Pt is well and stable, no stroke concerns     HPI:   Mr. CHARLES ANDRINGA is a 71 y.o. male with history of bilateral carotid artery stenosis (total occlusion of the left, moderate stenosis of the right), hypertension, hyperlipidemia, prior tobacco abuse, diabetes, coronary artery disease s/p CABG, peripheral vascular disease s/p stents, COPD who presented on 12/20/2021 with generalized weakness over the past 3 weeks.  He was found to be septic secondary to community-acquired pneumonia, intubated and started on antibiotics.  He developed A-fib with RVR, became hypotensive and required vasopressors.  He eventually converted back to normal sinus and pressors discontinued.  MRI showed punctate left temporal infarct likely secondary to left ICA chronic occlusion with hypoperfusion vs A-fib although lower suspicion.  CTA head/neck showed left ICA occlusion and right ICA stenosis > 80% and carotid Doppler right ICA 80 to 90% stenosis.  Underwent right TCAR on 5/5 by Dr. Trula Slade.  Postprocedure, found to have left-sided weakness, left hemianopia and right gaze preference, lethargic and not answering questions.  CT no acute finding, CTA head/neck patent stent and negative LVO, MRI brain no acute finding. Felt symptoms likely in setting of right MCA hypoperfusion.  Placed on aspirin, Plavix and heparin with plans on transition to Eliquis once appropriate for new diagnosis of A-fib.  A1c 8.1.  LDL 21.  Completed course of antibiotics and discharged home on 2 L [place].  Patient and family refused SNF and discharged home with home health therapies on  5/10.  Returned to ED 5/20 for worsening cough and hypoxia despite O2 use.  Chest x-ray showed persistent pneumonia and given vancomycin and cefepime in the ED. MRSA screen positive.  He was discharged with home antibiotics and follow-up in ID clinic outpatient.    Today, 04/15/2022, patient is being seen for initial hospital stroke follow-up accompanied by his niece. Reports he has been recovering well since discharge. Feels like he has been making progress gradually getting stronger. Has been released from home health therapies. Ambulates with cane indoors and RW outdoors, denies any recent falls.  Denies new stroke/TIA symptoms. Lives with roommate and niece stays with him, gradually returning back to doing ADLs independently.   Remains on Eliquis and Crestor, denies side effects He has not yet started aspirin  Blood pressure 175/72 - monitors at home and typically stable. Does usually go up when coming to doctors appointments   S/p recanalization of right posterior tibial artery and balloon angioplasty and patent right superficial femoral artery with associated stents with single-vessel runoff via the peroneal artery and recanalization of the posterior tibial artery at the ankle and flush occlusion of the left superficial femoral artery for treatment of nonhealing right leg ulcer with multiple vascular issues and previously undergone multiple interventions with Dr. Trula Slade  No further concerns at this time     PERTINENT IMAGING  Per hospitalization 12/20/2021 MRI - Left ICA with poor flow or occlusion in the neck and at the skull base. Evidence of reconstituted flow at the Left ICA terminus by MRI, but also asymmetric decreased flow  in Left MCA branches. However, only trace acute ischemia is identified in the Left MCA territory (left temporal lobe white matter) with chronic cortical infarcts in the left middle and superior frontal gyri. Evidence of some underlying chronic small vessel disease  including in the pons. MRI 5/5 no acute finding CTA head and neck showed left ICA occlusion and right ICA stenosis > 80% CTA head/neck 5/5 negative LVO Carotid Doppler left ICA chronically occluded, right ICA 80 to 90% stenosis 2D Echo EF 50 to 55% LDL 21 HgbA1c 8.1    ROS:   14 system review of systems performed and negative with exception of those listed in HPI  PMH:  Past Medical History:  Diagnosis Date   Carotid artery disease ([place])    L-ICA 100%, mod R-ICA dz   COPD (chronic obstructive pulmonary disease) ([place])    Coronary artery disease    s/p CABG February 2010 by Dr. Merilynn Finland   Diabetes [facility])    Emphysema lung ([place])    Hyperlipidemia    Hypertension    Peripheral arterial disease ([place])    post left common iliac and right SFA stenting remotely   Tobacco abuse         PSH:  Past Surgical History:  Procedure Laterality Date   ABDOMINAL AORTOGRAM N/A 04/20/2017   Procedure: ABDOMINAL AORTOGRAM;  Surgeon: Serafina Mitchell, MD;  Location: [facility];  Service: Cardiovascular;  Laterality: N/A;   ABDOMINAL AORTOGRAM W/LOWER EXTREMITY Right 12/08/2016   Procedure: Abdominal Aortogram w/Lower Extremity;  Surgeon: Serafina Mitchell, MD;  Location: [place] [facility];  Service: Cardiovascular;  Laterality: Right;   ABDOMINAL AORTOGRAM W/LOWER EXTREMITY N/A 09/07/2017   Procedure: ABDOMINAL AORTOGRAM W/LOWER EXTREMITY;  Surgeon: Serafina Mitchell, MD;  Location: [place] [facility];  Service: Cardiovascular;  Laterality: N/A;   ABDOMINAL AORTOGRAM W/LOWER EXTREMITY N/A 03/31/2022   Procedure: ABDOMINAL AORTOGRAM W/LOWER EXTREMITY;  Surgeon: Serafina Mitchell, MD;  Location: [facility];  Service: Cardiovascular;  Laterality: N/A;   CARDIAC CATHETERIZATION  2010   CAROTID ANGIOGRAM  2014   CORONARY ARTERY BYPASS GRAFT  2010   LIMA-LAD, Lrad-OM1, SVG-RI, SVG-AM-dRCA   DOPPLER ECHOCARDIOGRAPHY  2010   LOWER EXTREMITY ANGIOGRAM  11/2006   left common femoral  endarterectomy and patch angioplasty:The mid right SFA was angioplastied with a 4 x 8   LOWER EXTREMITY ANGIOGRAM  2003   left common iliac artery stenting by Dr. Adora Fridge   LOWER EXTREMITY ANGIOGRAPHY Right 04/20/2017   Procedure: Lower Extremity Angiography;  Surgeon: Serafina Mitchell, MD;  Location: [facility];  Service: Cardiovascular;  Laterality: Right;   NM MYOVIEW LTD  2015   PERIPHERAL VASCULAR ATHERECTOMY Right 12/08/2016   Procedure: Peripheral Vascular Atherectomy;  Surgeon: Serafina Mitchell, MD;  Location: [facility];  Service: Cardiovascular;  Laterality: Right;   PERIPHERAL VASCULAR BALLOON ANGIOPLASTY Right 12/08/2016   Procedure: Peripheral Vascular Balloon Angioplasty;  Surgeon: Serafina Mitchell, MD;  Location: [place] [facility];  Service: Cardiovascular;  Laterality: Right;   PERIPHERAL VASCULAR BALLOON ANGIOPLASTY Right 04/20/2017   Procedure: PERIPHERAL VASCULAR BALLOON ANGIOPLASTY;  Surgeon: Serafina Mitchell, MD;  Location: [place] [facility];  Service: Cardiovascular;  Laterality: Right;  SFA   PERIPHERAL VASCULAR BALLOON ANGIOPLASTY  03/31/2022   Procedure: PERIPHERAL VASCULAR BALLOON ANGIOPLASTY;  Surgeon: Serafina Mitchell, MD;  Location: [place] [facility];  Service: Cardiovascular;;  Right PT   PERIPHERAL VASCULAR CATHETERIZATION N/A  09/22/2016   Procedure: Abdominal Aortogram w/ bilateral Lower Extremity Runoff;  Surgeon: Serafina Mitchell, MD;  Location: [facility];  Service: Cardiovascular;  Laterality: N/A;   PERIPHERAL VASCULAR CATHETERIZATION Right 09/22/2016   Procedure: Peripheral Vascular Intervention;  Surgeon: Serafina Mitchell, MD;  Location: [facility];  Service: Cardiovascular;  Laterality: Right;   TRANSCAROTID ARTERY REVASCULARIZATION  Right 12/26/2021   Procedure: Right Transcarotid Artery Revascularization;  Surgeon: Serafina Mitchell, MD;  [facility];  Service: Vascular;  Laterality: Right;   ULTRASOUND GUIDANCE FOR VASCULAR  ACCESS Left 12/26/2021   Procedure: ULTRASOUND GUIDANCE FOR VASCULAR ACCESS, LEFT FEMORAL VEIN;  Surgeon: Serafina Mitchell, MD;  Location: [facility];  Service: Vascular;  Laterality: Left;    Social History:  Social History   Socioeconomic History   Marital status: Single    Spouse name: Not on file   Number of children: Not on file   Years of education: Not on file   Highest education level: Not on file  Occupational History   Occupation: Retired [employer]  Tobacco Use   Smoking status: Former    Packs/day: 0.00    Types: Cigarettes    Quit date: 08/07/2016    Years since quitting: 5.6   Smokeless tobacco: Current    Types: Chew  Vaping Use   Vaping Use: Never used  Substance and Sexual Activity   Alcohol use: No    Alcohol/week: 0.0 standard drinks of alcohol    Comment: Weekend drinker, quit 2000. NIGHTLY [place]   Drug use: No   Sexual activity: Not on file  Other Topics Concern   Not on file  Social History Narrative   Lives in [place]. Has 2 roommates, they both smoke. Has 9 dogs.   Social Determinants of Health   Financial Resource Strain: Not on file  Food Insecurity: Not on file  Transportation Needs: Not on file  Physical Activity: Not on file  Stress: Not on file  Social Connections: Not on file  Intimate Partner Violence: Not on file    Family History:  Family History  Problem Relation Age of Onset   Heart attack Mother    CVA Mother    Heart disease Mother    Diabetes Sister    Hypertension Sister     Medications:   Current Outpatient Medications on File Prior to Visit  Medication Sig Dispense Refill   albuterol (VENTOLIN HFA) 108 (90 Base) MCG/ACT inhaler Inhale 2 puffs into the lungs every 4 (four) hours as needed for shortness of breath or wheezing.     apixaban (ELIQUIS) 2.5 MG TABS tablet Take 1 tablet (2.5 mg total) by mouth 2 (two) times daily. 180 tablet 1   aspirin 81 MG EC tablet Take 1 tablet (81 mg total) by mouth daily. Swallow whole. 30  tablet 0   Azelastine-Fluticasone 137-50 MCG/ACT SUSP Place 1 spray into the nose in the morning and at bedtime. (Patient taking differently: Place 1 spray into the nose every morning.) 23 g 5   Blood Glucose Monitoring Suppl (BLOOD GLUCOSE MONITOR SYSTEM) w/Device KIT Used to test blood sugars 4 times daily 1 kit 0   Cyanocobalamin (VITAMIN B-12 PO) Take 1 tablet by mouth daily.     insulin aspart (NOVOLOG) 100 UNIT/ML FlexPen Before each meal 3 times a day, 140-199 - 2 units, 200-250 - 4 units, 251-299 - 6 units,  300-349 - 8 units,  350 or above 10 units.  Insulin PEN if approved,  provide syringes and needles if needed. (Patient taking differently: Inject 0-10 Units into the skin 3 (three) times daily with meals as needed for high blood sugar. Per sliding scale: 140-199 - 2 units 200-250 - 4 units 251-299 - 6 units 300-349 - 8 units 350 or above 10 units.) 15 mL 0   Insulin Glargine (BASAGLAR KWIKPEN) 100 UNIT/ML Inject 15 Units into the skin daily. 15 mL 0   Insulin Pen Needle 32G X 4 MM MISC Use to inject insulin 4 times daily. 100 each 0   losartan (COZAAR) 50 MG tablet Take 50 mg by mouth daily.     metFORMIN (GLUCOPHAGE) 500 MG tablet Take 1,000 mg by mouth 2 (two) times daily with a meal.     metoprolol succinate (TOPROL-XL) 25 MG 24 hr tablet TAKE 1 TABLET(25 MG) BY MOUTH DAILY 30 tablet 10   Multiple Vitamins-Minerals (CENTRUM SILVER 50+MEN) TABS Take 1 tablet by mouth daily.     NITROSTAT 0.4 MG SL tablet Take 0.4 mg by mouth every 5 (five) minutes as needed for chest pain.  0   pantoprazole (PROTONIX) 40 MG tablet Take 1 tablet (40 mg total) by mouth daily. 30 tablet 2   potassium chloride (K-DUR) 10 MEQ tablet Take 10 mEq by mouth daily.     Pseudoeph-Doxylamine-DM-APAP (NYQUIL PO) Take 30 mLs by mouth at bedtime.     rosuvastatin (CRESTOR) 20 MG tablet Take 1 tablet (20 mg total) by mouth daily. 30 tablet 0   SPIRIVA HANDIHALER 18 MCG inhalation capsule Place 18 mcg into inhaler  and inhale at bedtime.     traMADol (ULTRAM) 50 MG tablet Take 50 mg by mouth 3 (three) times daily as needed for pain.     No current facility-administered medications on file prior to visit.    Allergies:   Allergies  Allergen Reactions   Actos [Pioglitazone] Shortness Of Breath    Leg swelling    Lipitor [Atorvastatin] Other (See Comments)    Leg pain    Zestril [Lisinopril] Cough      OBJECTIVE:  Physical Exam  Vitals:   04/15/22 1438  BP: (!) 175/72  Pulse: 84  Weight: 108 lb (49 kg)  Height: _0  (1.549 m)   Body mass index is 20.41 kg/m. No results found.   General: Frail very pleasant elderly Caucasian male, seated, in no evident distress Head: head normocephalic and atraumatic.   Neck: supple with no carotid or supraclavicular bruits Cardiovascular: regular rate and rhythm, no murmurs Musculoskeletal: no deformity Skin:  no rash/petichiae Vascular:  Normal pulses all extremities   Neurologic Exam Mental Status: Awake and fully alert. Oriented to place and time. Recent and remote memory intact. Attention span, concentration and fund of knowledge appropriate. Mood and affect appropriate.  Cranial Nerves: Pupils equal, briskly reactive to light. Extraocular movements full without nystagmus. Visual fields full to confrontation. Hearing intact. Facial sensation intact. Face, tongue, palate moves normally and symmetrically.  Motor: Normal bulk and tone. Normal strength in all tested extremity muscles Sensory.: intact to touch , pinprick , position and vibratory sensation.  Coordination: Rapid alternating movements normal in all extremities. Finger-to-nose and heel-to-shin performed accurately bilaterally. Gait and Station: Arises from chair without difficulty. Stance is slightly hunched. Gait demonstrates slow cautious steps and decreased stride length and step height bilaterally with use of rollator walker.  Tandem walk and heel toe not attempted.  Reflexes: 1+  and symmetric. Toes downgoing.     NIHSS  0 Modified Rankin  0      ASSESSMENT: GRACYN SANTILLANES is a 71 y.o. year old male with left temporal infarct on 12/20/2021 likely secondary to left ICA chronic occlusion with hypoperfusion in setting of sepsis secondary to community-acquired pneumonia vs new onset A fib although lower suspicion. Vascular risk factors include new onset A-fib, left ICA occlusion, right ICA stenosis s/p TCAR 12/26/2021, HTN, HLD, DM, prior tobacco use, CAD s/p CABG and PVD s/p stents.      PLAN:  Left temporal lobe stroke:  Recovered well without residual deficit Continue  Eliquis   and Crestor for secondary stroke prevention and advised to restart aspirin 81 mg daily for recent TCAR and PVD with multiple stents Discussed secondary stroke prevention measures and importance of close PCP follow up for aggressive stroke risk factor management including BP goal<130/90, HLD with LDL goal<70 and DM with A1c.<7 .  Stroke labs 12/2021: LDL 21, A1c 8.1 I have gone over the pathophysiology of stroke, warning signs and symptoms, risk factors and their management in some detail with instructions to go to the closest emergency room for symptoms of concern. S/p R ICA TCAR: Followed by vascular surgery Dr. Trula Slade Atrial fibrillation, new dx: Remains on Eliquis managed/monitored by PCP.    Doing well from stroke standpoint and risk factors are managed by PCP. She may follow up PRN, as usual for our patients who are strictly being followed for stroke. If any new neurological issues should arise, request PCP place referral for evaluation by one of our neurologists. Thank you.     CC:  [facility] provider: Dr. Leonie Man PCP: Seward Carol, MD    I spent 59 minutes of face-to-face and non-face-to-face time with patient and niece.  This included previsit chart review including review of recent hospitalization, lab review, study review, electronic health record documentation, patient and niece  education regarding recent stroke including etiology, secondary stroke prevention measures and importance of managing stroke risk factors and answered all other questions to patient satisfaction   Frann Rider, AGNP-BC  [facility] [address] [employer] [place] [place], [place] [address]  Phone [phone] Fax [phone] Note: This document was prepared with digital dictation and possible smart phrase technology. Any transcriptional errors that result from this process are unintentional.

## 2022-04-20 DIAGNOSIS — I480 Paroxysmal atrial fibrillation: Secondary | ICD-10-CM | POA: Diagnosis not present

## 2022-04-20 DIAGNOSIS — I739 Peripheral vascular disease, unspecified: Secondary | ICD-10-CM | POA: Diagnosis not present

## 2022-04-20 DIAGNOSIS — I10 Essential (primary) hypertension: Secondary | ICD-10-CM | POA: Diagnosis not present

## 2022-04-20 DIAGNOSIS — N1831 Chronic kidney disease, stage 3a: Secondary | ICD-10-CM | POA: Diagnosis not present

## 2022-04-20 DIAGNOSIS — R7989 Other specified abnormal findings of blood chemistry: Secondary | ICD-10-CM | POA: Diagnosis not present

## 2022-04-20 DIAGNOSIS — E1121 Type 2 diabetes mellitus with diabetic nephropathy: Secondary | ICD-10-CM | POA: Diagnosis not present

## 2022-04-20 DIAGNOSIS — E7800 Pure hypercholesterolemia, unspecified: Secondary | ICD-10-CM | POA: Diagnosis not present

## 2022-05-06 DIAGNOSIS — R7989 Other specified abnormal findings of blood chemistry: Secondary | ICD-10-CM | POA: Diagnosis not present

## 2022-05-12 NOTE — Telephone Encounter (Signed)
Left Vm on Niece [name] phone number

## 2022-05-12 NOTE — Telephone Encounter (Signed)
Called on 05/12/2022

## 2022-06-01 ENCOUNTER — Ambulatory Visit (INDEPENDENT_AMBULATORY_CARE_PROVIDER_SITE_OTHER): Payer: Medicare Other | Admitting: Internal Medicine

## 2022-06-01 ENCOUNTER — Encounter: Payer: Self-pay | Admitting: Internal Medicine

## 2022-06-01 VITALS — BP 150/74 | HR 86 | Temp 98.0°F | Ht 61.0 in | Wt 110.6 lb

## 2022-06-01 DIAGNOSIS — J310 Chronic rhinitis: Secondary | ICD-10-CM | POA: Diagnosis not present

## 2022-06-01 DIAGNOSIS — J4489 Other specified chronic obstructive pulmonary disease: Secondary | ICD-10-CM

## 2022-06-01 DIAGNOSIS — J439 Emphysema, unspecified: Secondary | ICD-10-CM | POA: Diagnosis not present

## 2022-06-01 DIAGNOSIS — Z23 Encounter for immunization: Secondary | ICD-10-CM | POA: Diagnosis not present

## 2022-06-01 NOTE — Progress Notes (Signed)
Reginald Ayers    [id number]    September 01, 1950  Primary Care Physician:Polite, Jori Moll, MD Date of Appointment: 06/01/2022 Established Patient Visit  Chief complaint:   Chief Complaint  Patient presents with   Follow-up    Cough improved      HPI: Reginald Ayers is a 71 y.o. man with history of emphysema and chronic cough.  Interval Updates: Here for follow up for chronic cough. Improved after starting dymista.  Still feels congested occasionally.  No interval hospitalizations or ED visits.  On spiriva and albuterol prn for emphysema.   Here with his roommate who is the person who called 911 for him when he was hospitalized for pneumonia.   Says a good quality of life for him is being able to live at home independently, take care of his dogs, bathe and dress himself, cook his own food and clean up after himself.   I have reviewed the patient's family social and past medical history and updated as appropriate.   Past Medical History:  Diagnosis Date   Carotid artery disease ([place])    L-ICA 100%, mod R-ICA dz   COPD (chronic obstructive pulmonary disease) ([place])    Coronary artery disease    s/p CABG February 2010 by Dr. Merilynn Finland   Diabetes [facility])    Emphysema lung [facility])    Hyperlipidemia    Hypertension    Peripheral arterial disease ([place])    post left common iliac and right SFA stenting remotely   Tobacco abuse         Past Surgical History:  Procedure Laterality Date   ABDOMINAL AORTOGRAM N/A 04/20/2017   Procedure: ABDOMINAL AORTOGRAM;  Surgeon: Serafina Mitchell, MD;  Location: [place] [facility];  Service: Cardiovascular;  Laterality: N/A;   ABDOMINAL AORTOGRAM W/LOWER EXTREMITY Right 12/08/2016   Procedure: Abdominal Aortogram w/Lower Extremity;  Surgeon: Serafina Mitchell, MD;  Location: [place] [facility];  Service: Cardiovascular;  Laterality: Right;   ABDOMINAL AORTOGRAM W/LOWER EXTREMITY N/A 09/07/2017   Procedure: ABDOMINAL AORTOGRAM W/LOWER  EXTREMITY;  Surgeon: Serafina Mitchell, MD;  Location: [place] [facility];  Service: Cardiovascular;  Laterality: N/A;   ABDOMINAL AORTOGRAM W/LOWER EXTREMITY N/A 03/31/2022   Procedure: ABDOMINAL AORTOGRAM W/LOWER EXTREMITY;  Surgeon: Serafina Mitchell, MD;  Location: [place] [facility];  Service: Cardiovascular;  Laterality: N/A;   CARDIAC CATHETERIZATION  2010   CAROTID ANGIOGRAM  2014   CORONARY ARTERY BYPASS GRAFT  2010   LIMA-LAD, Lrad-OM1, SVG-RI, SVG-AM-dRCA   DOPPLER ECHOCARDIOGRAPHY  2010   LOWER EXTREMITY ANGIOGRAM  11/2006   left common femoral endarterectomy and patch angioplasty:The mid right SFA was angioplastied with a 4 x 8   LOWER EXTREMITY ANGIOGRAM  2003   left common iliac artery stenting by Dr. Adora Fridge   LOWER EXTREMITY ANGIOGRAPHY Right 04/20/2017   Procedure: Lower Extremity Angiography;  Surgeon: Serafina Mitchell, MD;  Location: [place] [facility];  Service: Cardiovascular;  Laterality: Right;   NM MYOVIEW LTD  2015   PERIPHERAL VASCULAR ATHERECTOMY Right 12/08/2016   Procedure: Peripheral Vascular Atherectomy;  Surgeon: Serafina Mitchell, MD;  Location: [place] [facility];  Service: Cardiovascular;  Laterality: Right;   PERIPHERAL VASCULAR BALLOON ANGIOPLASTY Right 12/08/2016   Procedure: Peripheral Vascular Balloon Angioplasty;  Surgeon: Serafina Mitchell, MD;  Location: [place] [facility];  Service: Cardiovascular;  Laterality: Right;   PERIPHERAL VASCULAR BALLOON ANGIOPLASTY Right 04/20/2017   Procedure:  PERIPHERAL VASCULAR BALLOON ANGIOPLASTY;  Surgeon: Serafina Mitchell, MD;  Location: [place] [facility];  Service: Cardiovascular;  Laterality: Right;  SFA   PERIPHERAL VASCULAR BALLOON ANGIOPLASTY  03/31/2022   Procedure: PERIPHERAL VASCULAR BALLOON ANGIOPLASTY;  Surgeon: Serafina Mitchell, MD;  Location: [place] [facility];  Service: Cardiovascular;;  Right PT   PERIPHERAL VASCULAR CATHETERIZATION N/A 09/22/2016   Procedure: Abdominal Aortogram w/ bilateral Lower Extremity  Runoff;  Surgeon: Serafina Mitchell, MD;  Location: [place] [facility];  Service: Cardiovascular;  Laterality: N/A;   PERIPHERAL VASCULAR CATHETERIZATION Right 09/22/2016   Procedure: Peripheral Vascular Intervention;  Surgeon: Serafina Mitchell, MD;  Location: [place] [facility];  Service: Cardiovascular;  Laterality: Right;   TRANSCAROTID ARTERY REVASCULARIZATION  Right 12/26/2021   Procedure: Right Transcarotid Artery Revascularization;  Surgeon: Serafina Mitchell, MD;  Location: [facility];  Service: Vascular;  Laterality: Right;   ULTRASOUND GUIDANCE FOR VASCULAR ACCESS Left 12/26/2021   Procedure: ULTRASOUND GUIDANCE FOR VASCULAR ACCESS, LEFT FEMORAL VEIN;  Surgeon: Serafina Mitchell, MD;  Location: [facility] OR;  Service: Vascular;  Laterality: Left;    Family History  Problem Relation Age of Onset   Heart attack Mother    CVA Mother    Heart disease Mother    Diabetes Sister    Hypertension Sister     Social History   Occupational History   Occupation: Retired Development worker, community  Tobacco Use   Smoking status: Former    Packs/day: 0.00    Types: Cigarettes    Quit date: 08/07/2016    Years since quitting: 5.8   Smokeless tobacco: Current    Types: Chew  Vaping Use   Vaping Use: Never used  Substance and Sexual Activity   Alcohol use: No    Alcohol/week: 0.0 standard drinks of alcohol    Comment: Weekend drinker, quit 2000. NIGHTLY nyquill   Drug use: No   Sexual activity: Not on file     Physical Exam: Blood pressure (!) 150/74, pulse 86, temperature 98 F (36.7 C), temperature source Oral, height 5\' 1"  (1.549 m), weight 110 lb 9.6 oz (50.2 kg), SpO2 96 %.  Gen:      No acute distress ENT:  hoarse voice, no nasal polyps, mucus membranes moist Lungs:    diminished, clear CV:         Regular rate and rhythm; no murmurs, rubs, or gallops.  No pedal edema   Data Reviewed: Imaging: I have personally reviewed the   PFTs:      No data to display         I have personally reviewed the  patient's PFTs and   Labs:  Immunization status: Immunization History  Administered Date(s) Administered   Fluad Quad(high Dose 65+) 06/01/2022   Influenza Split 06/14/2009, 06/26/2011, 08/22/2012, 08/25/2012, 06/14/2013   Influenza, High Dose Seasonal PF 07/05/2015, 06/22/2016   Influenza-Unspecified 06/05/2010, 06/06/2014   PFIZER(Purple Top)SARS-COV-2 Vaccination 11/10/2019, 12/05/2019   Pneumococcal Conjugate-13 12/21/2014   Pneumococcal Polysaccharide-23 09/25/2003, 06/14/2013   Td 07/13/2007   Tdap 04/14/2012   Zoster, Live 03/28/2012    External Records Personally Reviewed:   Assessment:  COPD with emphysema Chronic cough - UACS History of CVA Paroxysmal atrial fibrillation Goals of care  Plan/Recommendations: Continue spiriva and albuterol as needed.  Continue dymista nasal spray.   We discussed goals of care and end of life issues including what to do in the event of morbidity requiring hospitalization.  He feels that he has lived his "  life expectancy" and doesn't want to keep going if he can't "live." Which to him means being able to eat, talk to people, attend his church, live at home, take care of his dogs. If he can't walk, talk, put on his clothes, make his own bed and take care of himself. If he was reliant on another person for ADLs or dependent in anyway he would not consider that good quality of life. He would not want to undergo CPR or be put on a ventilator.   MOST form signed today and scanned into the computer. Would be ok with IV abx, oxygen therapy. Avoid ICU. Would not want CPR or mechanical ventilation.   He is asking how often he has to come back and see me. I am happy to see him back as needed and if his PCP has additional questions or concerns  Return to Care: Return if symptoms worsen or fail to improve.   I spent 30 minutes in the care of this patient today including pre-charting, chart review, review of results, face-to-face care,  coordination of care and communication with consultants etc.).   Lenice Llamas, MD [facility] [place]

## 2022-06-01 NOTE — Patient Instructions (Addendum)
Follow up with me as needed.   Continue spiriva and albuterol as needed.  Continue dymista nasal spray.

## 2022-06-02 ENCOUNTER — Other Ambulatory Visit: Payer: Self-pay | Admitting: *Deleted

## 2022-06-02 DIAGNOSIS — I70213 Atherosclerosis of native arteries of extremities with intermittent claudication, bilateral legs: Secondary | ICD-10-CM

## 2022-06-02 DIAGNOSIS — I872 Venous insufficiency (chronic) (peripheral): Secondary | ICD-10-CM

## 2022-06-08 ENCOUNTER — Ambulatory Visit (HOSPITAL_COMMUNITY)
Admission: RE | Admit: 2022-06-08 | Discharge: 2022-06-08 | Disposition: A | Discharge status: Home / Self Care | Payer: Medicare Other | Source: Ambulatory Visit | Attending: Surgery | Admitting: Surgery

## 2022-06-08 ENCOUNTER — Ambulatory Visit (INDEPENDENT_AMBULATORY_CARE_PROVIDER_SITE_OTHER): Payer: Medicare Other | Admitting: Physician Assistant

## 2022-06-08 VITALS — BP 160/71 | HR 83 | Temp 97.9°F | Resp 16 | Ht 63.0 in | Wt 107.0 lb

## 2022-06-08 DIAGNOSIS — I872 Venous insufficiency (chronic) (peripheral): Secondary | ICD-10-CM

## 2022-06-08 DIAGNOSIS — I739 Peripheral vascular disease, unspecified: Secondary | ICD-10-CM | POA: Diagnosis not present

## 2022-06-08 DIAGNOSIS — I779 Disorder of arteries and arterioles, unspecified: Secondary | ICD-10-CM

## 2022-06-08 DIAGNOSIS — I70213 Atherosclerosis of native arteries of extremities with intermittent claudication, bilateral legs: Secondary | ICD-10-CM | POA: Diagnosis not present

## 2022-06-08 NOTE — Progress Notes (Signed)
HISTORY AND PHYSICAL     CC:  follow up. Requesting Provider:  Seward Carol, MD  HPI: Reginald Ayers is a 71 y.o. male who is here today for follow up for PAD.  Pt has hx of the following procedures:  09/22/2016:Viabahn stenting of an occluded right superficial femoral artery 12/08/2016 atherectomy with angioplasty, right posterior tibial artery 04/20/2017: Drug-coated balloon angioplasty, right superficial femoral artery, balloon angioplasty, right common femoral artery 09/07/2017: Diagnostic angiogram showing patent right superficial femoral artery stenting and flush occlusion of the left superficial femoral artery  He presented to the hospital in May 2023 with shortness of breath and weakness requiring intubation for respiratory failure.  He underwent a carotid duplex which showed a known left carotid occlusion but progression of the stenosis on the right side, greater than 80%.  Imaging revealed a left MCA distribution acute stroke.  It was felt that this was likely related to his chronic occlusion on the left and hypoperfusion.  Other potential etiologies were atrial fibrillation.  He ended up getting a CT angiogram showed a calcified lesion at the carotid bifurcation. On 12/26/2021, he underwent right sided TCAR.  He was found to have a greater than 80% stenosis which was resolved after stenting.  Following the procedure, he was not moving his left side.  A code stroke was called.  He was taken to CT scan.  This showed his carotid stent was widely patent. A MRI was negative for a new stroke.  His neurologic symptoms completely resolved.  This was not felt to be a perioperative neurologic insult, but likely secondary to hypoperfusion during stenting.  He was on aspirin, Plavix, and Eliquis following the procedure.  His Plavix was discontinued after 1 month.  Pt was seen 03/02/2022 by Dr. Trula Slade and at that time, he was concerned about arterial flow in the right leg. He did have a pressure  wound/decubitus that was improving. He was scheduled for arteriogram and was taken to the [facility] lab on 03/31/2022 and underwent angioplasty of the right PTA by Dr. Trula Slade.  He had single vessel runoff via the peroneal artery with reconstitution of the PTA at the ankle. On the left, there was a flush occlusion of the left SFA.   The pt returns today for follow up.  He states that he has been doing well since the procedure.  He mostly ambulates with his rolling walker or cane.  He states occasionally he is able to walk around his house without using a walker or cane.  He denies any rest pain, claudication, wounds of the lower extremities.  He denies any stroke or TIA-like symptoms.  The pt is on a statin for cholesterol management.    The pt is on an aspirin.    Other AC:  Eliquis  Past Medical History:  Diagnosis Date   Carotid artery disease ([place])    L-ICA 100%, mod R-ICA dz   COPD (chronic obstructive pulmonary disease) ([place])    Coronary artery disease    s/p CABG February 2010 by Dr. Merilynn Finland   Diabetes [facility])    Emphysema lung [facility])    Hyperlipidemia    Hypertension    Peripheral arterial disease ([place])    post left common iliac and right SFA stenting remotely   Tobacco abuse         Past Surgical History:  Procedure Laterality Date   ABDOMINAL AORTOGRAM N/A 04/20/2017   Procedure: ABDOMINAL AORTOGRAM;  Surgeon: Serafina Mitchell, MD;  Location: [place]  [facility];  Service: Cardiovascular;  Laterality: N/A;   ABDOMINAL AORTOGRAM W/LOWER EXTREMITY Right 12/08/2016   Procedure: Abdominal Aortogram w/Lower Extremity;  Surgeon: Serafina Mitchell, MD;  Location: [place] [facility];  Service: Cardiovascular;  Laterality: Right;   ABDOMINAL AORTOGRAM W/LOWER EXTREMITY N/A 09/07/2017   Procedure: ABDOMINAL AORTOGRAM W/LOWER EXTREMITY;  Surgeon: Serafina Mitchell, MD;  Location: [place] [facility];  Service: Cardiovascular;  Laterality: N/A;   ABDOMINAL AORTOGRAM W/LOWER EXTREMITY N/A 03/31/2022    Procedure: ABDOMINAL AORTOGRAM W/LOWER EXTREMITY;  Surgeon: Serafina Mitchell, MD;  Location: [place] [facility];  Service: Cardiovascular;  Laterality: N/A;   CARDIAC CATHETERIZATION  2010   CAROTID ANGIOGRAM  2014   CORONARY ARTERY BYPASS GRAFT  2010   LIMA-LAD, Lrad-OM1, SVG-RI, SVG-AM-dRCA   DOPPLER ECHOCARDIOGRAPHY  2010   LOWER EXTREMITY ANGIOGRAM  11/2006   left common femoral endarterectomy and patch angioplasty:The mid right SFA was angioplastied with a 4 x 8   LOWER EXTREMITY ANGIOGRAM  2003   left common iliac artery stenting by Dr. Adora Fridge   LOWER EXTREMITY ANGIOGRAPHY Right 04/20/2017   Procedure: Lower Extremity Angiography;  Surgeon: Serafina Mitchell, MD;  Location: [place] [facility];  Service: Cardiovascular;  Laterality: Right;   NM MYOVIEW LTD  2015   PERIPHERAL VASCULAR ATHERECTOMY Right 12/08/2016   Procedure: Peripheral Vascular Atherectomy;  Surgeon: Serafina Mitchell, MD;  Location: [place] [facility];  Service: Cardiovascular;  Laterality: Right;   PERIPHERAL VASCULAR BALLOON ANGIOPLASTY Right 12/08/2016   Procedure: Peripheral Vascular Balloon Angioplasty;  Surgeon: Serafina Mitchell, MD;  Location: [place] [facility];  Service: Cardiovascular;  Laterality: Right;   PERIPHERAL VASCULAR BALLOON ANGIOPLASTY Right 04/20/2017   Procedure: PERIPHERAL VASCULAR BALLOON ANGIOPLASTY;  Surgeon: Serafina Mitchell, MD;  Location: [place] [facility];  Service: Cardiovascular;  Laterality: Right;  SFA   PERIPHERAL VASCULAR BALLOON ANGIOPLASTY  03/31/2022   Procedure: PERIPHERAL VASCULAR BALLOON ANGIOPLASTY;  Surgeon: Serafina Mitchell, MD;  Location: [place] [facility];  Service: Cardiovascular;;  Right PT   PERIPHERAL VASCULAR CATHETERIZATION N/A 09/22/2016   Procedure: Abdominal Aortogram w/ bilateral Lower Extremity Runoff;  Surgeon: Serafina Mitchell, MD;  Location: [place] [facility];  Service: Cardiovascular;  Laterality: N/A;   PERIPHERAL VASCULAR CATHETERIZATION Right 09/22/2016    Procedure: Peripheral Vascular Intervention;  Surgeon: Serafina Mitchell, MD;  Location: [place] [facility];  Service: Cardiovascular;  Laterality: Right;   TRANSCAROTID ARTERY REVASCULARIZATION  Right 12/26/2021   Procedure: Right Transcarotid Artery Revascularization;  Surgeon: Serafina Mitchell, MD;  Location: [facility] OR;  Service: Vascular;  Laterality: Right;   ULTRASOUND GUIDANCE FOR VASCULAR ACCESS Left 12/26/2021   Procedure: ULTRASOUND GUIDANCE FOR VASCULAR ACCESS, LEFT FEMORAL VEIN;  Surgeon: Serafina Mitchell, MD;  Location: [place];  Service: Vascular;  Laterality: Left;    Allergies  Allergen Reactions   Actos [Pioglitazone] Shortness Of Breath    Leg swelling    Lipitor [Atorvastatin] Other (See Comments)    Leg pain    Zestril [Lisinopril] Cough    Current Outpatient Medications  Medication Sig Dispense Refill   albuterol (VENTOLIN HFA) 108 (90 Base) MCG/ACT inhaler Inhale 2 puffs into the lungs every 4 (four) hours as needed for shortness of breath or wheezing.     apixaban (ELIQUIS) 2.5 MG TABS tablet Take 1 tablet (2.5 mg total) by mouth 2 (two) times daily. 180 tablet 1   aspirin 81 MG EC tablet Take 1 tablet (  81 mg total) by mouth daily. Swallow whole. 30 tablet 0   Azelastine-Fluticasone 137-50 MCG/ACT SUSP Place 1 spray into the nose in the morning and at bedtime. (Patient taking differently: Place 1 spray into the nose every morning.) 23 g 5   Blood Glucose Monitoring Suppl (BLOOD GLUCOSE MONITOR SYSTEM) w/Device KIT Used to test blood sugars 4 times daily 1 kit 0   Cyanocobalamin (VITAMIN B-12 PO) Take 1 tablet by mouth daily.     insulin aspart (NOVOLOG) 100 UNIT/ML FlexPen Before each meal 3 times a day, 140-199 - 2 units, 200-250 - 4 units, 251-299 - 6 units,  300-349 - 8 units,  350 or above 10 units.  Insulin PEN if approved, provide syringes and needles if needed. (Patient taking differently: Inject 0-10 Units into the skin 3 (three) times daily with meals as needed for high  blood sugar. Per sliding scale: 140-199 - 2 units 200-250 - 4 units 251-299 - 6 units 300-349 - 8 units 350 or above 10 units.) 15 mL 0   Insulin Glargine (BASAGLAR KWIKPEN) 100 UNIT/ML Inject 15 Units into the skin daily. 15 mL 0   Insulin Pen Needle 32G X 4 MM MISC Use to inject insulin 4 times daily. 100 each 0   losartan (COZAAR) 50 MG tablet Take 50 mg by mouth daily.     metFORMIN (GLUCOPHAGE) 500 MG tablet Take 1,000 mg by mouth 2 (two) times daily with a meal.     metoprolol succinate (TOPROL-XL) 25 MG 24 hr tablet TAKE 1 TABLET(25 MG) BY MOUTH DAILY 30 tablet 10   Multiple Vitamins-Minerals (CENTRUM SILVER 50+MEN) TABS Take 1 tablet by mouth daily.     NITROSTAT 0.4 MG SL tablet Take 0.4 mg by mouth every 5 (five) minutes as needed for chest pain.  0   pantoprazole (PROTONIX) 40 MG tablet Take 1 tablet (40 mg total) by mouth daily. 30 tablet 2   potassium chloride (K-DUR) 10 MEQ tablet Take 10 mEq by mouth daily.     Pseudoeph-Doxylamine-DM-APAP (NYQUIL PO) Take 30 mLs by mouth at bedtime.     rosuvastatin (CRESTOR) 20 MG tablet Take 1 tablet (20 mg total) by mouth daily. 30 tablet 0   SPIRIVA HANDIHALER 18 MCG inhalation capsule Place 18 mcg into inhaler and inhale at bedtime.     traMADol (ULTRAM) 50 MG tablet Take 50 mg by mouth 3 (three) times daily as needed for pain.     No current facility-administered medications for this visit.    Family History  Problem Relation Age of Onset   Heart attack Mother    CVA Mother    Heart disease Mother    Diabetes Sister    Hypertension Sister     Social History   Socioeconomic History   Marital status: Single    Spouse name: Not on file   Number of children: Not on file   Years of education: Not on file   Highest education level: Not on file  Occupational History   Occupation: Retired Development worker, community  Tobacco Use   Smoking status: Former    Packs/day: 0.00    Types: Cigarettes    Quit date: 08/07/2016    Years since quitting: 5.8    Smokeless tobacco: Current    Types: Chew  Vaping Use   Vaping Use: Never used  Substance and Sexual Activity   Alcohol use: No    Alcohol/week: 0.0 standard drinks of alcohol    Comment: Weekend drinker, quit 2000. NIGHTLY  nyquill   Drug use: No   Sexual activity: Not on file  Other Topics Concern   Not on file  Social History Narrative   Lives in [place]. Has 2 roommates, they both smoke. Has 9 dogs.   Social Determinants of Health   Financial Resource Strain: Not on file  Food Insecurity: Not on file  Transportation Needs: Not on file  Physical Activity: Not on file  Stress: Not on file  Social Connections: Not on file  Intimate Partner Violence: Not on file     REVIEW OF SYSTEMS:   '[X]'  denotes positive finding, '[ ]'  denotes negative finding Cardiac  Comments:  Chest pain or chest pressure:    Shortness of breath upon exertion:    Short of breath when lying flat:    Irregular heart rhythm:        Vascular    Pain in calf, thigh, or hip brought on by ambulation:    Pain in feet at night that wakes you up from your sleep:     Blood clot in your veins:    Leg swelling:         Pulmonary    Oxygen at home:    Productive cough:     Wheezing:         Neurologic    Sudden weakness in arms or legs:     Sudden numbness in arms or legs:     Sudden onset of difficulty speaking or slurred speech:    Temporary loss of vision in one eye:     Problems with dizziness:         Gastrointestinal    Blood in stool:     Vomited blood:         Genitourinary    Burning when urinating:     Blood in urine:        Psychiatric    Major depression:         Hematologic    Bleeding problems:    Problems with blood clotting too easily:        Skin    Rashes or ulcers:        Constitutional    Fever or chills:      PHYSICAL EXAMINATION:  General:  WDWN in NAD; vital signs documented above Gait: Not observed HENT: WNL, normocephalic Pulmonary: normal  non-labored breathing , without wheezing Cardiac: regular HR, without carotid bruit Abdomen: soft, NT; aortic pulse is non palpable Skin: without rashes Vascular Exam/Pulses: Brisk right PT/peroneal doppler signal.  Monophasic left PT/DP Doppler signal Extremities: without ischemic changes, without Gangrene , without cellulitis; without open wounds Musculoskeletal: no muscle wasting or atrophy  Neurologic: A&O X 3 Psychiatric:  The pt has Normal affect.   Non-Invasive Vascular Imaging:    ABI's/TBI's on 06/08/2022:  +-------+-----------+-----------+------------+------------+  ABI/TBIToday's ABIToday's TBIPrevious ABIPrevious TBI  +-------+-----------+-----------+------------+------------+  Right  0.79       0.54       0.81        0.44          +-------+-----------+-----------+------------+------------+  Left   .54        0.50       0.42        0.33          +-------+-----------+-----------+------------+------------+    ASSESSMENT/PLAN:: 71 y.o. male here for follow up for PAD    -Based on the patient's studies done today, his R ABI is essentially unchanged since his last visit.  It  was 0.81 and is now 0.79.  His L ABI has slightly increased from 0.42 to 0.54. -He has not been experiencing any claudication, rest pain, nonhealing wounds of the lower extremities -He has brisk right PT/peroneal Doppler signals.  He has monophasic left PT/DP Doppler signals -He can continue his Eliquis and aspirin -The patient can follow-up with [place] in 3 months with bilateral carotid artery duplex study.  He can follow-up in 6 months with repeat ABIs.  He understands to call or come back sooner if any issues arise   Vicente Serene, PA-C [facility] [phone]  Clinic MD:   Virl Cagey on call MD

## 2022-06-10 ENCOUNTER — Other Ambulatory Visit: Payer: Self-pay

## 2022-06-10 DIAGNOSIS — I739 Peripheral vascular disease, unspecified: Secondary | ICD-10-CM

## 2022-06-10 DIAGNOSIS — I6523 Occlusion and stenosis of bilateral carotid arteries: Secondary | ICD-10-CM

## 2022-06-25 ENCOUNTER — Other Ambulatory Visit: Payer: Self-pay | Admitting: Cardiovascular Disease

## 2022-06-25 NOTE — Telephone Encounter (Signed)
Eliquis 2.5mg  refill request received. Patient is 71 years old, weight-48.5kg, Crea-1.30 on 03/31/2022, Diagnosis-Afib, and last seen by Dr. Gwenlyn Found on 07/30/2021. Will send in refill to requested pharmacy.  Per pharmD send in a limited supply since pt is due to have MD appt and & will need labs repeated.

## 2022-07-22 DIAGNOSIS — I480 Paroxysmal atrial fibrillation: Secondary | ICD-10-CM | POA: Diagnosis not present

## 2022-07-22 DIAGNOSIS — N1831 Chronic kidney disease, stage 3a: Secondary | ICD-10-CM | POA: Diagnosis not present

## 2022-07-22 DIAGNOSIS — J9610 Chronic respiratory failure, unspecified whether with hypoxia or hypercapnia: Secondary | ICD-10-CM | POA: Diagnosis not present

## 2022-07-22 DIAGNOSIS — E7800 Pure hypercholesterolemia, unspecified: Secondary | ICD-10-CM | POA: Diagnosis not present

## 2022-07-22 DIAGNOSIS — I10 Essential (primary) hypertension: Secondary | ICD-10-CM | POA: Diagnosis not present

## 2022-07-22 DIAGNOSIS — E11319 Type 2 diabetes mellitus with unspecified diabetic retinopathy without macular edema: Secondary | ICD-10-CM | POA: Diagnosis not present

## 2022-07-22 DIAGNOSIS — E1151 Type 2 diabetes mellitus with diabetic peripheral angiopathy without gangrene: Secondary | ICD-10-CM | POA: Diagnosis not present

## 2022-07-22 DIAGNOSIS — I2510 Atherosclerotic heart disease of native coronary artery without angina pectoris: Secondary | ICD-10-CM | POA: Diagnosis not present

## 2022-07-22 DIAGNOSIS — J439 Emphysema, unspecified: Secondary | ICD-10-CM | POA: Diagnosis not present

## 2022-07-22 DIAGNOSIS — E1121 Type 2 diabetes mellitus with diabetic nephropathy: Secondary | ICD-10-CM | POA: Diagnosis not present

## 2022-07-22 DIAGNOSIS — Z8673 Personal history of transient ischemic attack (TIA), and cerebral infarction without residual deficits: Secondary | ICD-10-CM | POA: Diagnosis not present

## 2022-07-22 DIAGNOSIS — I739 Peripheral vascular disease, unspecified: Secondary | ICD-10-CM | POA: Diagnosis not present

## 2022-08-21 ENCOUNTER — Other Ambulatory Visit: Payer: Self-pay | Admitting: *Deleted

## 2022-08-21 DIAGNOSIS — I6523 Occlusion and stenosis of bilateral carotid arteries: Secondary | ICD-10-CM

## 2022-08-28 DIAGNOSIS — E440 Moderate protein-calorie malnutrition: Secondary | ICD-10-CM | POA: Diagnosis not present

## 2022-08-28 DIAGNOSIS — E1151 Type 2 diabetes mellitus with diabetic peripheral angiopathy without gangrene: Secondary | ICD-10-CM | POA: Diagnosis not present

## 2022-08-28 DIAGNOSIS — Z125 Encounter for screening for malignant neoplasm of prostate: Secondary | ICD-10-CM | POA: Diagnosis not present

## 2022-08-28 DIAGNOSIS — E1122 Type 2 diabetes mellitus with diabetic chronic kidney disease: Secondary | ICD-10-CM | POA: Diagnosis not present

## 2022-08-28 DIAGNOSIS — E559 Vitamin D deficiency, unspecified: Secondary | ICD-10-CM | POA: Diagnosis not present

## 2022-08-28 DIAGNOSIS — Z23 Encounter for immunization: Secondary | ICD-10-CM | POA: Diagnosis not present

## 2022-08-28 DIAGNOSIS — E785 Hyperlipidemia, unspecified: Secondary | ICD-10-CM | POA: Diagnosis not present

## 2022-08-28 DIAGNOSIS — D631 Anemia in chronic kidney disease: Secondary | ICD-10-CM | POA: Diagnosis not present

## 2022-08-28 DIAGNOSIS — F17210 Nicotine dependence, cigarettes, uncomplicated: Secondary | ICD-10-CM | POA: Diagnosis not present

## 2022-08-28 DIAGNOSIS — E1169 Type 2 diabetes mellitus with other specified complication: Secondary | ICD-10-CM | POA: Diagnosis not present

## 2022-08-28 DIAGNOSIS — D6869 Other thrombophilia: Secondary | ICD-10-CM | POA: Diagnosis not present

## 2022-08-28 DIAGNOSIS — Z79899 Other long term (current) drug therapy: Secondary | ICD-10-CM | POA: Diagnosis not present

## 2022-09-07 ENCOUNTER — Ambulatory Visit (HOSPITAL_COMMUNITY): Payer: Medicare Other

## 2022-09-07 ENCOUNTER — Ambulatory Visit: Payer: Medicare Other | Admitting: Surgery

## 2022-09-14 ENCOUNTER — Encounter: Payer: Self-pay | Admitting: Podiatry

## 2022-09-14 ENCOUNTER — Ambulatory Visit (INDEPENDENT_AMBULATORY_CARE_PROVIDER_SITE_OTHER): Payer: Medicare Other | Admitting: Podiatry

## 2022-09-14 DIAGNOSIS — B351 Tinea unguium: Secondary | ICD-10-CM | POA: Diagnosis not present

## 2022-09-14 DIAGNOSIS — M79674 Pain in right toe(s): Secondary | ICD-10-CM

## 2022-09-14 DIAGNOSIS — E1169 Type 2 diabetes mellitus with other specified complication: Secondary | ICD-10-CM

## 2022-09-14 DIAGNOSIS — I739 Peripheral vascular disease, unspecified: Secondary | ICD-10-CM

## 2022-09-14 DIAGNOSIS — N182 Chronic kidney disease, stage 2 (mild): Secondary | ICD-10-CM

## 2022-09-14 DIAGNOSIS — M79675 Pain in left toe(s): Secondary | ICD-10-CM | POA: Diagnosis not present

## 2022-09-14 NOTE — Progress Notes (Signed)
This patient returns to my office for at risk foot care.  This patient requires this care by a professional since this patient will be at risk due to having diabetes,CKD and PAD.  This patient is unable to cut nails himself since the patient cannot reach his nails.These nails are painful walking and wearing shoes.  This patient presents for at risk foot care today.  General Appearance  Alert, conversant and in no acute stress.  Vascular  Dorsalis pedis and posterior tibial  pulses are palpable  bilaterally.  Capillary return is within normal limits  bilaterally. Temperature is within normal limits  bilaterally.  Neurologic  Senn-Weinstein monofilament wire test within normal limits  bilaterally. Muscle power within normal limits bilaterally.  Nails Thick disfigured discolored nails with subungual debris  from hallux to fifth toes bilaterally. No evidence of bacterial infection or drainage bilaterally.  Orthopedic  No limitations of motion  feet .  No crepitus or effusions noted.  No bony pathology or digital deformities noted.  Skin  normotropic skin with no porokeratosis noted bilaterally.  No signs of infections or ulcers noted.   Healing skin lesion on lateral aspect right ankle.  Onychomycosis  Pain in right toes  Pain in left toes  Consent was obtained for treatment procedures.   Mechanical debridement of nails 1-5  bilaterally performed with a nail nipper.  Filed with dremel without incident. Dispense heel cushion for right ankle.   Return office visit    3 months                  Told patient to return for periodic foot care and evaluation due to potential at risk complications.   Gardiner Barefoot DPM

## 2022-09-21 ENCOUNTER — Ambulatory Visit (HOSPITAL_COMMUNITY)
Admission: RE | Admit: 2022-09-21 | Discharge: 2022-09-21 | Disposition: A | Discharge status: Home / Self Care | Payer: Medicare Other | Source: Ambulatory Visit | Attending: Surgery | Admitting: Surgery

## 2022-09-21 ENCOUNTER — Other Ambulatory Visit: Payer: Self-pay

## 2022-09-21 ENCOUNTER — Encounter: Payer: Self-pay | Admitting: Surgery

## 2022-09-21 ENCOUNTER — Ambulatory Visit (INDEPENDENT_AMBULATORY_CARE_PROVIDER_SITE_OTHER): Payer: Medicare Other | Admitting: Surgery

## 2022-09-21 ENCOUNTER — Telehealth: Payer: Self-pay

## 2022-09-21 VITALS — BP 156/82 | HR 90 | Temp 98.0°F | Resp 20 | Ht 63.0 in | Wt 102.0 lb

## 2022-09-21 DIAGNOSIS — I739 Peripheral vascular disease, unspecified: Secondary | ICD-10-CM

## 2022-09-21 DIAGNOSIS — I70248 Atherosclerosis of native arteries of left leg with ulceration of other part of lower left leg: Secondary | ICD-10-CM | POA: Diagnosis not present

## 2022-09-21 DIAGNOSIS — I6523 Occlusion and stenosis of bilateral carotid arteries: Secondary | ICD-10-CM | POA: Diagnosis not present

## 2022-09-21 NOTE — H&P (View-Only) (Signed)
[facility] [place]  Patient name: Reginald Ayers MRN: [id number] DOB: October 15, 1950 Sex: male   REASON FOR VISIT:    Follow up  HISOTRY OF PRESENT ILLNESS:    Reginald Ayers is a 72 y.o. male who is well-known to me, having undergone the following procedures: 09/22/2016:Viabahn stenting of an occluded right superficial femoral artery   12/08/2016 atherectomy with angioplasty, right posterior tibial artery   04/20/2017: Drug-coated balloon angioplasty, right superficial femoral artery, balloon angioplasty, right common femoral artery   09/07/2017: Diagnostic angiogram showing patent right superficial femoral artery stenting and flush occlusion of the left superficial femoral artery   He presented to the hospital in May 2023 with shortness of breath and weakness requiring intubation for respiratory failure.  He underwent a carotid duplex which showed a known left carotid occlusion but progression of the stenosis on the right side, greater than 80%.  Imaging revealed a left MCA distribution acute stroke.  It was felt that this was likely related to his chronic occlusion on the left and hypoperfusion.  Other potential etiologies were atrial fibrillation.  He ended up getting a CT angiogram showed a calcified lesion at the carotid bifurcation.  On 12/26/2021, he underwent right sided TCAR.  He was found to have a greater than 80% stenosis which was resolved after stenting.  Following the procedure, he was not moving his left side.  A code stroke was called.  He was taken to CT scan.  This showed his carotid stent was widely patent.  A MRI was negative for a new stroke.  His neurologic symptoms completely resolved.  This was felt to be secondary to recrudescence  When I saw him in July 2023, I was concerned about blood flow to his right leg.  He did have ulcers.  Therefore on 03/31/2022 he underwent angiography.  His right superficial femoral artery  stents were widely patent.  He underwent successful recanalization of occluded right posterior tibial artery with subsequent balloon angioplasty.  He had a flush occlusion of the left superficial femoral artery.  He now has a new right ankle ulcer that has been present for about a week.  He said he started having foot pain about 3 to 4 weeks ago and he is concerned about losing weight.  He does not have fear of food.  He does not have postprandial abdominal pain  The patient has a history of coronary artery disease, status post CABG.  He is a diabetic.  He is medically managed for hypertension.  He is on a statin for hypercholesterolemia.   PAST MEDICAL HISTORY:   Past Medical History:  Diagnosis Date   Carotid artery disease ([place])    L-ICA 100%, mod R-ICA dz   COPD (chronic obstructive pulmonary disease) ([place])    Coronary artery disease    s/p CABG February 2010 by Dr. Merilynn Finland   Diabetes [facility])    Emphysema lung ([place])    Hyperlipidemia    Hypertension    Peripheral arterial disease ([place])    post left common iliac and right SFA stenting remotely   Tobacco abuse          FAMILY HISTORY:   Family History  Problem Relation Age of Onset   Heart attack Mother    CVA Mother    Heart disease Mother    Diabetes Sister    Hypertension Sister     SOCIAL HISTORY:   Social History   Tobacco Use   Smoking  status: Former    Packs/day: 0.00    Types: Cigarettes    Quit date: 08/07/2016    Years since quitting: 6.1   Smokeless tobacco: Current    Types: Chew  Substance Use Topics   Alcohol use: No    Alcohol/week: 0.0 standard drinks of alcohol    Comment: Weekend drinker, quit 2000. NIGHTLY nyquill     ALLERGIES:   Allergies  Allergen Reactions   Actos [Pioglitazone] Shortness Of Breath    Leg swelling    Lipitor [Atorvastatin] Other (See Comments)    Leg pain    Zestril [Lisinopril] Cough     CURRENT MEDICATIONS:   Current Outpatient Medications   Medication Sig Dispense Refill   albuterol (VENTOLIN HFA) 108 (90 Base) MCG/ACT inhaler Inhale 2 puffs into the lungs every 4 (four) hours as needed for shortness of breath or wheezing.     apixaban (ELIQUIS) 2.5 MG TABS tablet Take 1 tablet (2.5 mg total) by mouth 2 (two) times daily. 180 tablet 0   aspirin 81 MG EC tablet Take 1 tablet (81 mg total) by mouth daily. Swallow whole. 30 tablet 0   Azelastine-Fluticasone 137-50 MCG/ACT SUSP Place 1 spray into the nose in the morning and at bedtime. (Patient taking differently: Place 1 spray into the nose every morning.) 23 g 5   Blood Glucose Monitoring Suppl (BLOOD GLUCOSE MONITOR SYSTEM) w/Device KIT Used to test blood sugars 4 times daily 1 kit 0   Cyanocobalamin (VITAMIN B-12 PO) Take 1 tablet by mouth daily.     insulin aspart (NOVOLOG) 100 UNIT/ML FlexPen Before each meal 3 times a day, 140-199 - 2 units, 200-250 - 4 units, 251-299 - 6 units,  300-349 - 8 units,  350 or above 10 units.  Insulin PEN if approved, provide syringes and needles if needed. (Patient taking differently: Inject 0-10 Units into the skin 3 (three) times daily with meals as needed for high blood sugar. Per sliding scale: 140-199 - 2 units 200-250 - 4 units 251-299 - 6 units 300-349 - 8 units 350 or above 10 units.) 15 mL 0   Insulin Glargine (BASAGLAR KWIKPEN) 100 UNIT/ML Inject 15 Units into the skin daily. 15 mL 0   Insulin Pen Needle 32G X 4 MM MISC Use to inject insulin 4 times daily. 100 each 0   losartan (COZAAR) 50 MG tablet Take 50 mg by mouth daily.     metFORMIN (GLUCOPHAGE) 500 MG tablet Take 1,000 mg by mouth 2 (two) times daily with a meal.     metoprolol succinate (TOPROL-XL) 25 MG 24 hr tablet TAKE 1 TABLET(25 MG) BY MOUTH DAILY 30 tablet 10   Multiple Vitamins-Minerals (CENTRUM SILVER 50+MEN) TABS Take 1 tablet by mouth daily.     NITROSTAT 0.4 MG SL tablet Take 0.4 mg by mouth every 5 (five) minutes as needed for chest pain.  0   pantoprazole (PROTONIX)  40 MG tablet Take 1 tablet (40 mg total) by mouth daily. 30 tablet 2   potassium chloride (K-DUR) 10 MEQ tablet Take 10 mEq by mouth daily.     Pseudoeph-Doxylamine-DM-APAP (NYQUIL PO) Take 30 mLs by mouth at bedtime.     rosuvastatin (CRESTOR) 20 MG tablet Take 1 tablet (20 mg total) by mouth daily. 30 tablet 0   SPIRIVA HANDIHALER 18 MCG inhalation capsule Place 18 mcg into inhaler and inhale at bedtime.     traMADol (ULTRAM) 50 MG tablet Take 50 mg by mouth 3 (three) times daily as  needed for pain.     No current facility-administered medications for this visit.    REVIEW OF SYSTEMS:   [X]  denotes positive finding, [ ]  denotes negative finding Cardiac  Comments:  Chest pain or chest pressure:    Shortness of breath upon exertion:    Short of breath when lying flat:    Irregular heart rhythm:        Vascular    Pain in calf, thigh, or hip brought on by ambulation:    Pain in feet at night that wakes you up from your sleep:     Blood clot in your veins:    Leg swelling:         Pulmonary    Oxygen at home:    Productive cough:     Wheezing:         Neurologic    Sudden weakness in arms or legs:     Sudden numbness in arms or legs:     Sudden onset of difficulty speaking or slurred speech:    Temporary loss of vision in one eye:     Problems with dizziness:         Gastrointestinal    Blood in stool:     Vomited blood:         Genitourinary    Burning when urinating:     Blood in urine:        Psychiatric    Major depression:         Hematologic    Bleeding problems:    Problems with blood clotting too easily:        Skin    Rashes or ulcers: x       Constitutional    Fever or chills:      PHYSICAL EXAM:   Vitals:   09/21/22 1342 09/21/22 1344  BP: (!) 172/88 (!) 156/82  Pulse: 90   Resp: 20   Temp: 98 F (36.7 C)   SpO2: 93%   Weight: 102 lb (46.3 kg)   Height: 5\' 3"  (1.6 m)     GENERAL: The patient is a well-nourished male, in no acute  distress. The vital signs are documented above. CARDIAC: There is a regular rate and rhythm.  VASCULAR: Nonpalpable pedal pulses PULMONARY: Non-labored respirations MUSCULOSKELETAL: There are no major deformities or cyanosis. NEUROLOGIC: No focal weakness or paresthesias are detected. SKIN: See photo below. PSYCHIATRIC: The patient has a normal affect.  STUDIES:   I have reviewed his carotid duplex with the following findings: Right Carotid: Patent RICA stent.   Left Carotid: Evidence consistent with a total occlusion of the left ICA.   Vertebrals:  Bilateral vertebral arteries demonstrate antegrade flow.  Subclavians: Normal flow hemodynamics were seen in bilateral subclavian               arteries.   MEDICAL ISSUES:   Carotid: Right carotid stent is widely patent.  He will need surveillance ultrasound in 1 year  PAD with ulcer: He has a new wound on the lateral side of his right ankle.  He also began having rest pain type symptoms about 3 to 4 weeks ago.  I think he needs to undergo angiography as I am concerned that his most recent posterior tibial intervention as occluded.  This will be scheduled on February 6.  Details the procedure were discussed with the patient.  All questions were answered.  This is a limb threatening situation    09/23/22, IV, MD,  FACS [facility] [phone] Pager [phone]

## 2022-09-21 NOTE — Progress Notes (Signed)
[facility] [place]  Patient name: Reginald Ayers MRN: [id number] DOB: October 15, 1950 Sex: male   REASON FOR VISIT:    Follow up  HISOTRY OF PRESENT ILLNESS:    Reginald Ayers is a 72 y.o. male who is well-known to me, having undergone the following procedures: 09/22/2016:Viabahn stenting of an occluded right superficial femoral artery   12/08/2016 atherectomy with angioplasty, right posterior tibial artery   04/20/2017: Drug-coated balloon angioplasty, right superficial femoral artery, balloon angioplasty, right common femoral artery   09/07/2017: Diagnostic angiogram showing patent right superficial femoral artery stenting and flush occlusion of the left superficial femoral artery   He presented to the hospital in May 2023 with shortness of breath and weakness requiring intubation for respiratory failure.  He underwent a carotid duplex which showed a known left carotid occlusion but progression of the stenosis on the right side, greater than 80%.  Imaging revealed a left MCA distribution acute stroke.  It was felt that this was likely related to his chronic occlusion on the left and hypoperfusion.  Other potential etiologies were atrial fibrillation.  He ended up getting a CT angiogram showed a calcified lesion at the carotid bifurcation.  On 12/26/2021, he underwent right sided TCAR.  He was found to have a greater than 80% stenosis which was resolved after stenting.  Following the procedure, he was not moving his left side.  A code stroke was called.  He was taken to CT scan.  This showed his carotid stent was widely patent.  A MRI was negative for a new stroke.  His neurologic symptoms completely resolved.  This was felt to be secondary to recrudescence  When I saw him in July 2023, I was concerned about blood flow to his right leg.  He did have ulcers.  Therefore on 03/31/2022 he underwent angiography.  His right superficial femoral artery  stents were widely patent.  He underwent successful recanalization of occluded right posterior tibial artery with subsequent balloon angioplasty.  He had a flush occlusion of the left superficial femoral artery.  He now has a new right ankle ulcer that has been present for about a week.  He said he started having foot pain about 3 to 4 weeks ago and he is concerned about losing weight.  He does not have fear of food.  He does not have postprandial abdominal pain  The patient has a history of coronary artery disease, status post CABG.  He is a diabetic.  He is medically managed for hypertension.  He is on a statin for hypercholesterolemia.   PAST MEDICAL HISTORY:   Past Medical History:  Diagnosis Date   Carotid artery disease ([place])    L-ICA 100%, mod R-ICA dz   COPD (chronic obstructive pulmonary disease) ([place])    Coronary artery disease    s/p CABG February 2010 by Dr. Merilynn Finland   Diabetes [facility])    Emphysema lung ([place])    Hyperlipidemia    Hypertension    Peripheral arterial disease ([place])    post left common iliac and right SFA stenting remotely   Tobacco abuse          FAMILY HISTORY:   Family History  Problem Relation Age of Onset   Heart attack Mother    CVA Mother    Heart disease Mother    Diabetes Sister    Hypertension Sister     SOCIAL HISTORY:   Social History   Tobacco Use   Smoking  status: Former    Packs/day: 0.00    Types: Cigarettes    Quit date: 08/07/2016    Years since quitting: 6.1   Smokeless tobacco: Current    Types: Chew  Substance Use Topics   Alcohol use: No    Alcohol/week: 0.0 standard drinks of alcohol    Comment: Weekend drinker, quit 2000. NIGHTLY nyquill     ALLERGIES:   Allergies  Allergen Reactions   Actos [Pioglitazone] Shortness Of Breath    Leg swelling    Lipitor [Atorvastatin] Other (See Comments)    Leg pain    Zestril [Lisinopril] Cough     CURRENT MEDICATIONS:   Current Outpatient Medications   Medication Sig Dispense Refill   albuterol (VENTOLIN HFA) 108 (90 Base) MCG/ACT inhaler Inhale 2 puffs into the lungs every 4 (four) hours as needed for shortness of breath or wheezing.     apixaban (ELIQUIS) 2.5 MG TABS tablet Take 1 tablet (2.5 mg total) by mouth 2 (two) times daily. 180 tablet 0   aspirin 81 MG EC tablet Take 1 tablet (81 mg total) by mouth daily. Swallow whole. 30 tablet 0   Azelastine-Fluticasone 137-50 MCG/ACT SUSP Place 1 spray into the nose in the morning and at bedtime. (Patient taking differently: Place 1 spray into the nose every morning.) 23 g 5   Blood Glucose Monitoring Suppl (BLOOD GLUCOSE MONITOR SYSTEM) w/Device KIT Used to test blood sugars 4 times daily 1 kit 0   Cyanocobalamin (VITAMIN B-12 PO) Take 1 tablet by mouth daily.     insulin aspart (NOVOLOG) 100 UNIT/ML FlexPen Before each meal 3 times a day, 140-199 - 2 units, 200-250 - 4 units, 251-299 - 6 units,  300-349 - 8 units,  350 or above 10 units.  Insulin PEN if approved, provide syringes and needles if needed. (Patient taking differently: Inject 0-10 Units into the skin 3 (three) times daily with meals as needed for high blood sugar. Per sliding scale: 140-199 - 2 units 200-250 - 4 units 251-299 - 6 units 300-349 - 8 units 350 or above 10 units.) 15 mL 0   Insulin Glargine (BASAGLAR KWIKPEN) 100 UNIT/ML Inject 15 Units into the skin daily. 15 mL 0   Insulin Pen Needle 32G X 4 MM MISC Use to inject insulin 4 times daily. 100 each 0   losartan (COZAAR) 50 MG tablet Take 50 mg by mouth daily.     metFORMIN (GLUCOPHAGE) 500 MG tablet Take 1,000 mg by mouth 2 (two) times daily with a meal.     metoprolol succinate (TOPROL-XL) 25 MG 24 hr tablet TAKE 1 TABLET(25 MG) BY MOUTH DAILY 30 tablet 10   Multiple Vitamins-Minerals (CENTRUM SILVER 50+MEN) TABS Take 1 tablet by mouth daily.     NITROSTAT 0.4 MG SL tablet Take 0.4 mg by mouth every 5 (five) minutes as needed for chest pain.  0   pantoprazole (PROTONIX)  40 MG tablet Take 1 tablet (40 mg total) by mouth daily. 30 tablet 2   potassium chloride (K-DUR) 10 MEQ tablet Take 10 mEq by mouth daily.     Pseudoeph-Doxylamine-DM-APAP (NYQUIL PO) Take 30 mLs by mouth at bedtime.     rosuvastatin (CRESTOR) 20 MG tablet Take 1 tablet (20 mg total) by mouth daily. 30 tablet 0   SPIRIVA HANDIHALER 18 MCG inhalation capsule Place 18 mcg into inhaler and inhale at bedtime.     traMADol (ULTRAM) 50 MG tablet Take 50 mg by mouth 3 (three) times daily as  needed for pain.     No current facility-administered medications for this visit.    REVIEW OF SYSTEMS:   [X] denotes positive finding, [ ] denotes negative finding Cardiac  Comments:  Chest pain or chest pressure:    Shortness of breath upon exertion:    Short of breath when lying flat:    Irregular heart rhythm:        Vascular    Pain in calf, thigh, or hip brought on by ambulation:    Pain in feet at night that wakes you up from your sleep:     Blood clot in your veins:    Leg swelling:         Pulmonary    Oxygen at home:    Productive cough:     Wheezing:         Neurologic    Sudden weakness in arms or legs:     Sudden numbness in arms or legs:     Sudden onset of difficulty speaking or slurred speech:    Temporary loss of vision in one eye:     Problems with dizziness:         Gastrointestinal    Blood in stool:     Vomited blood:         Genitourinary    Burning when urinating:     Blood in urine:        Psychiatric    Major depression:         Hematologic    Bleeding problems:    Problems with blood clotting too easily:        Skin    Rashes or ulcers: x       Constitutional    Fever or chills:      PHYSICAL EXAM:   Vitals:   09/21/22 1342 09/21/22 1344  BP: (!) 172/88 (!) 156/82  Pulse: 90   Resp: 20   Temp: 98 F (36.7 C)   SpO2: 93%   Weight: 102 lb (46.3 kg)   Height: 5' 3" (1.6 m)     GENERAL: The patient is a well-nourished male, in no acute  distress. The vital signs are documented above. CARDIAC: There is a regular rate and rhythm.  VASCULAR: Nonpalpable pedal pulses PULMONARY: Non-labored respirations MUSCULOSKELETAL: There are no major deformities or cyanosis. NEUROLOGIC: No focal weakness or paresthesias are detected. SKIN: See photo below. PSYCHIATRIC: The patient has a normal affect.  STUDIES:   I have reviewed his carotid duplex with the following findings: Right Carotid: Patent RICA stent.   Left Carotid: Evidence consistent with a total occlusion of the left ICA.   Vertebrals:  Bilateral vertebral arteries demonstrate antegrade flow.  Subclavians: Normal flow hemodynamics were seen in bilateral subclavian               arteries.   MEDICAL ISSUES:   Carotid: Right carotid stent is widely patent.  He will need surveillance ultrasound in 1 year  PAD with ulcer: He has a new wound on the lateral side of his right ankle.  He also began having rest pain type symptoms about 3 to 4 weeks ago.  I think he needs to undergo angiography as I am concerned that his most recent posterior tibial intervention as occluded.  This will be scheduled on February 6.  Details the procedure were discussed with the patient.  All questions were answered.  This is a limb threatening situation    Wells Namita Yearwood, IV, MD,   FACS [facility] [phone] Pager [phone]

## 2022-09-21 NOTE — Telephone Encounter (Signed)
Attempted to reach pt to give him updated arrival time for his procedure next week. I have left voicemail asking him to call back to confirm he has received the message.

## 2022-09-23 ENCOUNTER — Telehealth: Payer: Self-pay

## 2022-09-23 NOTE — Telephone Encounter (Signed)
Called pt to give him the updated procedure arrival time for next week. Pt verbalized understanding; no questions/concerns at this time.

## 2022-09-29 ENCOUNTER — Encounter (HOSPITAL_COMMUNITY)
Admission: RE | Disposition: A | Discharge status: Home / Self Care | Payer: Self-pay | Source: Home / Self Care | Attending: Surgery

## 2022-09-29 ENCOUNTER — Ambulatory Visit (HOSPITAL_COMMUNITY)
Admission: RE | Admit: 2022-09-29 | Discharge: 2022-09-29 | Disposition: A | Discharge status: Home / Self Care | Payer: Medicare Other | Attending: Surgery | Admitting: Surgery

## 2022-09-29 ENCOUNTER — Encounter (HOSPITAL_COMMUNITY): Payer: Self-pay | Admitting: Surgery

## 2022-09-29 ENCOUNTER — Other Ambulatory Visit: Payer: Self-pay

## 2022-09-29 DIAGNOSIS — E7800 Pure hypercholesterolemia, unspecified: Secondary | ICD-10-CM | POA: Insufficient documentation

## 2022-09-29 DIAGNOSIS — I10 Essential (primary) hypertension: Secondary | ICD-10-CM | POA: Diagnosis not present

## 2022-09-29 DIAGNOSIS — Z794 Long term (current) use of insulin: Secondary | ICD-10-CM | POA: Diagnosis not present

## 2022-09-29 DIAGNOSIS — I739 Peripheral vascular disease, unspecified: Secondary | ICD-10-CM

## 2022-09-29 DIAGNOSIS — I70248 Atherosclerosis of native arteries of left leg with ulceration of other part of lower left leg: Secondary | ICD-10-CM

## 2022-09-29 DIAGNOSIS — F17220 Nicotine dependence, chewing tobacco, uncomplicated: Secondary | ICD-10-CM | POA: Insufficient documentation

## 2022-09-29 DIAGNOSIS — I2510 Atherosclerotic heart disease of native coronary artery without angina pectoris: Secondary | ICD-10-CM | POA: Diagnosis not present

## 2022-09-29 DIAGNOSIS — Z7984 Long term (current) use of oral hypoglycemic drugs: Secondary | ICD-10-CM | POA: Insufficient documentation

## 2022-09-29 DIAGNOSIS — E11621 Type 2 diabetes mellitus with foot ulcer: Secondary | ICD-10-CM | POA: Insufficient documentation

## 2022-09-29 DIAGNOSIS — L97319 Non-pressure chronic ulcer of right ankle with unspecified severity: Secondary | ICD-10-CM | POA: Insufficient documentation

## 2022-09-29 DIAGNOSIS — Z951 Presence of aortocoronary bypass graft: Secondary | ICD-10-CM | POA: Insufficient documentation

## 2022-09-29 DIAGNOSIS — I70233 Atherosclerosis of native arteries of right leg with ulceration of ankle: Secondary | ICD-10-CM | POA: Diagnosis not present

## 2022-09-29 DIAGNOSIS — I70228 Atherosclerosis of native arteries of extremities with rest pain, other extremity: Secondary | ICD-10-CM | POA: Insufficient documentation

## 2022-09-29 HISTORY — PX: ABDOMINAL AORTOGRAM W/LOWER EXTREMITY: CATH118223

## 2022-09-29 HISTORY — PX: PERIPHERAL VASCULAR BALLOON ANGIOPLASTY: CATH118281

## 2022-09-29 HISTORY — PX: PERIPHERAL VASCULAR INTERVENTION: CATH118257

## 2022-09-29 LAB — POCT I-STAT, CHEM 8
BUN: 24 mg/dL — ABNORMAL HIGH (ref 8–23)
Calcium, Ion: 1.23 mmol/L (ref 1.15–1.40)
Chloride: 103 mmol/L (ref 98–111)
Creatinine, Ser: 1.3 mg/dL — ABNORMAL HIGH (ref 0.61–1.24)
Glucose, Bld: 220 mg/dL — ABNORMAL HIGH (ref 70–99)
HCT: 31 % — ABNORMAL LOW (ref 39.0–52.0)
Hemoglobin: 10.5 g/dL — ABNORMAL LOW (ref 13.0–17.0)
Potassium: 4.3 mmol/L (ref 3.5–5.1)
Sodium: 134 mmol/L — ABNORMAL LOW (ref 135–145)
TCO2: 20 mmol/L — ABNORMAL LOW (ref 22–32)

## 2022-09-29 LAB — POCT ACTIVATED CLOTTING TIME: Activated Clotting Time: 239 seconds

## 2022-09-29 LAB — GLUCOSE, CAPILLARY
Glucose-Capillary: 210 mg/dL — ABNORMAL HIGH (ref 70–99)
Glucose-Capillary: 173 mg/dL — ABNORMAL HIGH (ref 70–99)

## 2022-09-29 SURGERY — ABDOMINAL AORTOGRAM W/LOWER EXTREMITY
Anesthesia: LOCAL | Laterality: Right

## 2022-09-29 MED ORDER — SODIUM CHLORIDE 0.9 % IV SOLN: INTRAVENOUS | Status: DC

## 2022-09-29 MED ORDER — ASPIRIN 81 MG PO TBEC: 81.0000 mg | DELAYED_RELEASE_TABLET | Freq: Every day | ORAL | Status: DC

## 2022-09-29 MED ORDER — HEPARIN SODIUM (PORCINE) 1000 UNIT/ML IJ SOLN
INTRAMUSCULAR | Status: DC | PRN
  Administered 2022-09-29: 5000 [IU] via INTRAVENOUS
  Administered 2022-09-29: 1000 [IU] via INTRAVENOUS

## 2022-09-29 MED ORDER — CLOPIDOGREL BISULFATE 300 MG PO TABS
ORAL_TABLET | ORAL | Status: DC | PRN
  Administered 2022-09-29: 300 mg via ORAL

## 2022-09-29 MED ORDER — SODIUM CHLORIDE 0.9 % IV SOLN: 250.0000 mL | INTRAVENOUS | Status: DC | PRN

## 2022-09-29 MED ORDER — LIDOCAINE HCL (PF) 1 % IJ SOLN
INTRAMUSCULAR | Status: DC | PRN
  Administered 2022-09-29: 12 mL

## 2022-09-29 MED ORDER — CLOPIDOGREL BISULFATE 300 MG PO TABS
ORAL_TABLET | ORAL | Status: AC
  Filled 2022-09-29: qty 1

## 2022-09-29 MED ORDER — HYDRALAZINE HCL 20 MG/ML IJ SOLN: 5.0000 mg | INTRAMUSCULAR | Status: DC | PRN

## 2022-09-29 MED ORDER — NITROGLYCERIN 1 MG/10 ML FOR IR/CATH LAB
INTRA_ARTERIAL | Status: DC | PRN
  Administered 2022-09-29: 200 ug via INTRA_ARTERIAL

## 2022-09-29 MED ORDER — MORPHINE SULFATE (PF) 2 MG/ML IV SOLN: 2.0000 mg | INTRAVENOUS | Status: DC | PRN

## 2022-09-29 MED ORDER — HEPARIN (PORCINE) IN NACL 1000-0.9 UT/500ML-% IV SOLN
INTRAVENOUS | Status: DC | PRN
  Administered 2022-09-29 (×2): 500 mL

## 2022-09-29 MED ORDER — HEPARIN (PORCINE) IN NACL 1000-0.9 UT/500ML-% IV SOLN
INTRAVENOUS | Status: AC
  Filled 2022-09-29: qty 1000

## 2022-09-29 MED ORDER — MIDAZOLAM HCL 2 MG/2ML IJ SOLN
INTRAMUSCULAR | Status: DC | PRN
  Administered 2022-09-29 (×3): 1 mg via INTRAVENOUS

## 2022-09-29 MED ORDER — IODIXANOL 320 MG/ML IV SOLN
INTRAVENOUS | Status: DC | PRN
  Administered 2022-09-29: 150 mL

## 2022-09-29 MED ORDER — SODIUM CHLORIDE 0.9 % WEIGHT BASED INFUSION: 1.0000 mL/kg/h | INTRAVENOUS | Status: DC

## 2022-09-29 MED ORDER — ONDANSETRON HCL 4 MG/2ML IJ SOLN: 4.0000 mg | Freq: Four times a day (QID) | INTRAMUSCULAR | Status: DC | PRN

## 2022-09-29 MED ORDER — OXYCODONE HCL 5 MG PO TABS: 5.0000 mg | ORAL_TABLET | ORAL | Status: DC | PRN

## 2022-09-29 MED ORDER — CLOPIDOGREL BISULFATE 75 MG PO TABS: 75.0000 mg | ORAL_TABLET | Freq: Every day | ORAL | 11 refills | Status: DC

## 2022-09-29 MED ORDER — LABETALOL HCL 5 MG/ML IV SOLN: 10.0000 mg | INTRAVENOUS | Status: DC | PRN

## 2022-09-29 MED ORDER — CLOPIDOGREL BISULFATE 75 MG PO TABS: 300.0000 mg | ORAL_TABLET | Freq: Once | ORAL | Status: DC

## 2022-09-29 MED ORDER — SODIUM CHLORIDE 0.9% FLUSH: 3.0000 mL | Freq: Two times a day (BID) | INTRAVENOUS | Status: DC

## 2022-09-29 MED ORDER — NITROGLYCERIN 1 MG/10 ML FOR IR/CATH LAB
INTRA_ARTERIAL | Status: AC
  Filled 2022-09-29: qty 10

## 2022-09-29 MED ORDER — MIDAZOLAM HCL 2 MG/2ML IJ SOLN
INTRAMUSCULAR | Status: AC
  Filled 2022-09-29: qty 2

## 2022-09-29 MED ORDER — SODIUM CHLORIDE 0.9% FLUSH: 3.0000 mL | INTRAVENOUS | Status: DC | PRN

## 2022-09-29 MED ORDER — FENTANYL CITRATE (PF) 100 MCG/2ML IJ SOLN
INTRAMUSCULAR | Status: AC
  Filled 2022-09-29: qty 2

## 2022-09-29 MED ORDER — FENTANYL CITRATE (PF) 100 MCG/2ML IJ SOLN
INTRAMUSCULAR | Status: DC | PRN
  Administered 2022-09-29: 25 ug via INTRAVENOUS
  Administered 2022-09-29: 50 ug via INTRAVENOUS
  Administered 2022-09-29: 25 ug via INTRAVENOUS

## 2022-09-29 MED ORDER — CLOPIDOGREL BISULFATE 75 MG PO TABS: 75.0000 mg | ORAL_TABLET | Freq: Every day | ORAL | Status: DC

## 2022-09-29 MED ORDER — LIDOCAINE HCL (PF) 1 % IJ SOLN
INTRAMUSCULAR | Status: AC
  Filled 2022-09-29: qty 30

## 2022-09-29 MED ORDER — HEPARIN SODIUM (PORCINE) 1000 UNIT/ML IJ SOLN
INTRAMUSCULAR | Status: AC
  Filled 2022-09-29: qty 10

## 2022-09-29 MED ORDER — ACETAMINOPHEN 325 MG PO TABS: 650.0000 mg | ORAL_TABLET | ORAL | Status: DC | PRN

## 2022-09-29 SURGICAL SUPPLY — 23 items
BALLN MUSTANG 5X100X135 (BALLOONS) ×2
BALLN STERLING OTW 3X100X150 (BALLOONS) ×2
BALLOON MUSTANG 5X100X135 (BALLOONS) IMPLANT
BALLOON STERLING OTW 3X100X150 (BALLOONS) IMPLANT
CATH OMNI FLUSH 5F 65CM (CATHETERS) IMPLANT
DEVICE VASC CLSR CELT ART 6 (Vascular Products) IMPLANT
KIT ENCORE 26 ADVANTAGE (KITS) IMPLANT
KIT MICROPUNCTURE NIT STIFF (SHEATH) IMPLANT
KIT PV (KITS) ×3 IMPLANT
SHEATH CATAPULT 6FR 45 (SHEATH) IMPLANT
SHEATH PINNACLE 5F 10CM (SHEATH) IMPLANT
SHEATH PINNACLE 6F 10CM (SHEATH) IMPLANT
SHEATH PROBE COVER 6X72 (BAG) IMPLANT
STENT ELUVIA 6X120X130 (Permanent Stent) IMPLANT
STOPCOCK MORSE 400PSI 3WAY (MISCELLANEOUS) IMPLANT
SYR MEDRAD MARK 7 150ML (SYRINGE) ×3 IMPLANT
TAPE SHOOT N SEE (TAPE) IMPLANT
TRANSDUCER W/STOPCOCK (MISCELLANEOUS) ×3 IMPLANT
TRAY PV CATH (CUSTOM PROCEDURE TRAY) ×3 IMPLANT
TUBING CIL FLEX 10 FLL-RA (TUBING) IMPLANT
WIRE BENTSON .035X145CM (WIRE) IMPLANT
WIRE G V18X300CM (WIRE) IMPLANT
WIRE HI TORQ VERSACORE 300 (WIRE) IMPLANT

## 2022-09-29 NOTE — Op Note (Signed)
Patient name: JOSEL KEO MRN: [id number] DOB: 25-Feb-1951 Sex: male  09/29/2022 Pre-operative Diagnosis: Right foot wound Post-operative diagnosis:  Same Surgeon:  Annamarie Major Procedure Performed:  1.  Ultrasound-guided access, left femoral artery  2.  Abdominal aortogram  3.  Right lower extremity runoff  4.  Stent, right popliteal artery  5.  Balloon angioplasty, right posterior tibial, peroneal, and tibioperoneal trunk  6.  Conscious sedation, 94 minutes  7.  Closure device, Celt  8.  Intra-arterial administration of nitroglycerin   Indications: This is a 72 year old gentleman who was undergone multiple previous interventions who has developed rest pain and a new wound.  He is here for further evaluation  Procedure:  The patient was identified in the holding area and taken to room 8.  The patient was then placed supine on the table and prepped and draped in the usual sterile fashion.  A time out was called.  Conscious sedation was administered with the use of IV fentanyl and Versed under continuous physician and nurse monitoring.  Heart rate, blood pressure, and oxygen saturation were continuously monitored.  Total sedation time was 55minutes.  Ultrasound was used to evaluate the left common femoral artery.  It was patent .  A digital ultrasound image was acquired.  A micropuncture needle was used to access the left common femoral artery under ultrasound guidance.  An 018 wire was advanced without resistance and a micropuncture sheath was placed.  The 018 wire was removed and a benson wire was placed.  The micropuncture sheath was exchanged for a 5 french sheath.  An omniflush catheter was advanced over the wire to the level of L-1.  An abdominal angiogram was obtained.  Next, using the omniflush catheter and a benson wire, the aortic bifurcation was crossed and the catheter was placed into theright external iliac artery and right runoff was obtained.   Findings:   Aortogram: No  significant renal artery stenosis.  The infrarenal abdominal aorta is widely patent.  The stent within the left common iliac artery is widely patent.  Left external iliac artery is widely patent.  \The right common and external iliac arteries are patent without stenosis  Right Lower Extremity: The right common femoral profundofemoral artery widely patent.  There is a stent within the superficial femoral artery which is widely patent.  Distal to the stent within the above-knee popliteal artery there is significant luminal narrowing with stenosis of greater than 60%.  There is single-vessel runoff via the peroneal artery with reconstitution of the posterior tibial artery at the ankle from peroneal collaterals.  Left Lower Extremity: Not evaluated  Intervention: After the above images were acquired the decision was made to proceed with intervention.  A 6 French sheath was advanced into the right superficial femoral artery.  The patient was fully heparinized.  Using a 035 versa core wire, the wire was passed easily into the popliteal artery.  I elected to primarily stent the popliteal artery with a 6 x 120 Eluvia that was postdilated with a 5 mm balloon.  Follow-up imaging showed resolution of the above-knee popliteal stenosis.  There did appear to be a small filling defect within the proximal peroneal artery and so I advanced a V-18 wire through the peroneal artery and perform balloon angioplasty with a 3 mm balloon of the tibioperoneal trunk and peroneal artery.  Next, the wire was directed into the stump of the posterior tibial artery and then advanced into the posterior tibial artery across ankle.  The posterior tibial was treated with balloon angioplasty using a 3 x 100 mm Sterling balloon.  On completion imaging I was not very happy with the flow through the tibial vessels.  I repeated balloon angioplasty at a low atmosphere for prolonged period of time in the tibioperoneal trunk and peroneal artery.  I  administered 800 mcg of nitroglycerin into the posterior tibial and peroneal artery as well.  Completion imaging showed that all vessels fill.  I did not see any dissection.  There was a sluggish flow through the tibial vessels.  At this point, I elected to stop as I did not see if there is anything else to pursue.  The groin was closed with a Celt  Impression:  #1  Greater than 60% popliteal stenosis treated by using a 6 x 120 Eluvia stent  #2  Balloon angioplasty of the right peroneal artery with a 3 mm balloon  #3  Recanalization of the posterior tibial artery and subsequent balloon angioplasty with a 3 mm balloon    V. Annamarie Major, M.D., [facility] [place] Office: [phone] Pager:  [phone]

## 2022-09-29 NOTE — Progress Notes (Signed)
Merrilee Seashore returned call for Dr .Trula Slade- who said patient could start back Eliquis tomorrow.

## 2022-09-29 NOTE — Interval H&P Note (Signed)
History and Physical Interval Note:  09/29/2022 9:37 AM  Reginald Ayers  has presented today for surgery, with the diagnosis of atherosclerosis of the native artery of left leg with ulceration.  The various methods of treatment have been discussed with the patient and family. After consideration of risks, benefits and other options for treatment, the patient has consented to  Procedure(s): ABDOMINAL AORTOGRAM W/LOWER EXTREMITY (N/A) as a surgical intervention.  The patient's history has been reviewed, patient examined, no change in status, stable for surgery.  I have reviewed the patient's chart and labs.  Questions were answered to the patient's satisfaction.     Annamarie Major

## 2022-09-29 NOTE — Progress Notes (Signed)
Pt discharge instructions given to pt and caregiver verbally and in writing.  Both verbalize understanding and deny further questions.(R) groin site [employer]

## 2022-10-01 ENCOUNTER — Telehealth: Payer: Self-pay | Admitting: Surgery

## 2022-10-01 NOTE — Telephone Encounter (Signed)
-----   Message from Serafina Mitchell, MD sent at 09/29/2022 11:23 AM EST ----- 09/29/2022:    Surgeon:  Annamarie Major Procedure Performed:  1.  Ultrasound-guided access, left femoral artery  2.  Abdominal aortogram  3.  Right lower extremity runoff  4.  Stent, right popliteal artery  5.  Balloon angioplasty, right posterior tibial, peroneal, and tibioperoneal trunk  6.  Conscious sedation, 94 minutes  7.  Closure device, Celt  8.  Intra-arterial administration of nitroglycerin   F/u 1 month with right leg duplex and  abi

## 2022-10-07 DIAGNOSIS — E1151 Type 2 diabetes mellitus with diabetic peripheral angiopathy without gangrene: Secondary | ICD-10-CM | POA: Diagnosis not present

## 2022-10-07 DIAGNOSIS — Z1211 Encounter for screening for malignant neoplasm of colon: Secondary | ICD-10-CM | POA: Diagnosis not present

## 2022-10-07 DIAGNOSIS — Z7901 Long term (current) use of anticoagulants: Secondary | ICD-10-CM | POA: Diagnosis not present

## 2022-10-07 DIAGNOSIS — N182 Chronic kidney disease, stage 2 (mild): Secondary | ICD-10-CM | POA: Diagnosis not present

## 2022-10-07 DIAGNOSIS — E1122 Type 2 diabetes mellitus with diabetic chronic kidney disease: Secondary | ICD-10-CM | POA: Diagnosis not present

## 2022-10-07 DIAGNOSIS — Z0001 Encounter for general adult medical examination with abnormal findings: Secondary | ICD-10-CM | POA: Diagnosis not present

## 2022-10-07 DIAGNOSIS — D631 Anemia in chronic kidney disease: Secondary | ICD-10-CM | POA: Diagnosis not present

## 2022-10-07 DIAGNOSIS — E1140 Type 2 diabetes mellitus with diabetic neuropathy, unspecified: Secondary | ICD-10-CM | POA: Diagnosis not present

## 2022-10-07 DIAGNOSIS — E1169 Type 2 diabetes mellitus with other specified complication: Secondary | ICD-10-CM | POA: Diagnosis not present

## 2022-10-07 DIAGNOSIS — E440 Moderate protein-calorie malnutrition: Secondary | ICD-10-CM | POA: Diagnosis not present

## 2022-10-07 DIAGNOSIS — D6869 Other thrombophilia: Secondary | ICD-10-CM | POA: Diagnosis not present

## 2022-10-07 DIAGNOSIS — B351 Tinea unguium: Secondary | ICD-10-CM | POA: Diagnosis not present

## 2022-10-07 DIAGNOSIS — Z7984 Long term (current) use of oral hypoglycemic drugs: Secondary | ICD-10-CM | POA: Diagnosis not present

## 2022-10-07 DIAGNOSIS — Z79899 Other long term (current) drug therapy: Secondary | ICD-10-CM | POA: Diagnosis not present

## 2022-11-04 ENCOUNTER — Other Ambulatory Visit: Payer: Self-pay | Admitting: *Deleted

## 2022-11-04 DIAGNOSIS — I739 Peripheral vascular disease, unspecified: Secondary | ICD-10-CM

## 2022-11-10 DIAGNOSIS — R2681 Unsteadiness on feet: Secondary | ICD-10-CM | POA: Diagnosis not present

## 2022-11-10 DIAGNOSIS — E440 Moderate protein-calorie malnutrition: Secondary | ICD-10-CM | POA: Diagnosis not present

## 2022-11-10 DIAGNOSIS — Z79899 Other long term (current) drug therapy: Secondary | ICD-10-CM | POA: Diagnosis not present

## 2022-11-10 DIAGNOSIS — I130 Hypertensive heart and chronic kidney disease with heart failure and stage 1 through stage 4 chronic kidney disease, or unspecified chronic kidney disease: Secondary | ICD-10-CM | POA: Diagnosis not present

## 2022-11-10 DIAGNOSIS — I480 Paroxysmal atrial fibrillation: Secondary | ICD-10-CM | POA: Diagnosis not present

## 2022-11-10 DIAGNOSIS — D6869 Other thrombophilia: Secondary | ICD-10-CM | POA: Diagnosis not present

## 2022-11-10 DIAGNOSIS — M159 Polyosteoarthritis, unspecified: Secondary | ICD-10-CM | POA: Diagnosis not present

## 2022-11-10 DIAGNOSIS — I70213 Atherosclerosis of native arteries of extremities with intermittent claudication, bilateral legs: Secondary | ICD-10-CM | POA: Diagnosis not present

## 2022-11-16 ENCOUNTER — Encounter (HOSPITAL_COMMUNITY): Payer: Medicare Other

## 2022-11-23 ENCOUNTER — Ambulatory Visit (HOSPITAL_COMMUNITY)
Admission: RE | Admit: 2022-11-23 | Discharge: 2022-11-23 | Disposition: A | Discharge status: Home / Self Care | Payer: Medicare Other | Source: Ambulatory Visit | Attending: Surgery | Admitting: Surgery

## 2022-11-23 ENCOUNTER — Ambulatory Visit (INDEPENDENT_AMBULATORY_CARE_PROVIDER_SITE_OTHER)
Admission: RE | Admit: 2022-11-23 | Discharge: 2022-11-23 | Disposition: A | Discharge status: Home / Self Care | Payer: Medicare Other | Source: Ambulatory Visit | Attending: Surgery | Admitting: Surgery

## 2022-11-23 ENCOUNTER — Ambulatory Visit (INDEPENDENT_AMBULATORY_CARE_PROVIDER_SITE_OTHER): Payer: Medicare Other | Admitting: Physician Assistant

## 2022-11-23 VITALS — BP 163/86 | HR 96 | Temp 97.6°F | Wt 96.0 lb

## 2022-11-23 DIAGNOSIS — I6523 Occlusion and stenosis of bilateral carotid arteries: Secondary | ICD-10-CM | POA: Diagnosis not present

## 2022-11-23 DIAGNOSIS — I70233 Atherosclerosis of native arteries of right leg with ulceration of ankle: Secondary | ICD-10-CM

## 2022-11-23 DIAGNOSIS — I739 Peripheral vascular disease, unspecified: Secondary | ICD-10-CM

## 2022-11-23 LAB — VAS US ABI WITH/WO TBI
Left ABI: 0.52
Right ABI: 0.8

## 2022-11-23 NOTE — Progress Notes (Unsigned)
Office Note     CC:  follow up Requesting Provider:  Seward Carol, MD  HPI: Reginald Ayers is a 72 y.o. (01-30-1951) male who presents for follow up of PAD. He recently underwent Aortogram with RLE runoff, stenting of right popliteal artery and balloon angioplasty of right PT, Peroneal and Tpt trunk by Dr. Trula Slade on 09/29/22. This was performed secondary to rest pain and new ulceration of his right lateral malleolus.   Today he presents with his roommate. He reports that overall his legs are doing better. He feels the wound is healing although he says he can't really see it. He has been putting a OTC antibiotic ointment on it. He reports that it is still sore. He is not having any pain on ambulation or rest. No other new tissue loss. He ambulates using rolling walker. He says he does not walk around much except inside his house.   History of multiple interventions as follows: 09/22/2016:Viabahn stenting of an occluded right superficial femoral artery 12/08/2016 atherectomy with angioplasty, right posterior tibial artery 04/20/2017: Drug-coated balloon angioplasty, right superficial femoral artery, balloon angioplasty, right common femoral artery  09/07/2017: Diagnostic angiogram showing patent right superficial femoral artery stenting and flush occlusion of the left superficial femoral artery 03/31/2022: Aortogram, BLE arteriogram with angioplasty of right Posterior tibial artery 09/29/2022: Aortogram with RLE runoff, stenting of right popliteal artery, and balloon angioplasty of right PT, peroneal, and tpt trunk   Hx of right TCAR by Dr. Trula Slade on 12/26/21. He has been doing well since with no recurrent stenosis on follow up duplex. He has known left ICA occlusion   Past Medical History:  Diagnosis Date   Carotid artery disease ([place])    L-ICA 100%, mod R-ICA dz   COPD (chronic obstructive pulmonary disease) ([place])    Coronary artery disease    s/p CABG February 2010 by Dr. Merilynn Finland    Diabetes [facility])    Emphysema lung [facility])    Hyperlipidemia    Hypertension    Peripheral arterial disease ([place])    post left common iliac and right SFA stenting remotely   Tobacco abuse         Past Surgical History:  Procedure Laterality Date   ABDOMINAL AORTOGRAM N/A 04/20/2017   Procedure: ABDOMINAL AORTOGRAM;  Surgeon: Serafina Mitchell, MD;  Location: [place] [facility];  Service: Cardiovascular;  Laterality: N/A;   ABDOMINAL AORTOGRAM W/LOWER EXTREMITY Right 12/08/2016   Procedure: Abdominal Aortogram w/Lower Extremity;  Surgeon: Serafina Mitchell, MD;  Location: [place] [facility];  Service: Cardiovascular;  Laterality: Right;   ABDOMINAL AORTOGRAM W/LOWER EXTREMITY N/A 09/07/2017   Procedure: ABDOMINAL AORTOGRAM W/LOWER EXTREMITY;  Surgeon: Serafina Mitchell, MD;  Location: [place] [facility];  Service: Cardiovascular;  Laterality: N/A;   ABDOMINAL AORTOGRAM W/LOWER EXTREMITY N/A 03/31/2022   Procedure: ABDOMINAL AORTOGRAM W/LOWER EXTREMITY;  Surgeon: Serafina Mitchell, MD;  Location: [place] [facility];  Service: Cardiovascular;  Laterality: N/A;   ABDOMINAL AORTOGRAM W/LOWER EXTREMITY Right 09/29/2022   Procedure: ABDOMINAL AORTOGRAM W/LOWER EXTREMITY;  Surgeon: Serafina Mitchell, MD;  Location: [place] [facility];  Service: Cardiovascular;  Laterality: Right;   CARDIAC CATHETERIZATION  2010   CAROTID ANGIOGRAM  2014   CORONARY ARTERY BYPASS GRAFT  2010   LIMA-LAD, Lrad-OM1, SVG-RI, SVG-AM-dRCA   DOPPLER ECHOCARDIOGRAPHY  2010   LOWER EXTREMITY ANGIOGRAM  11/2006   left common femoral endarterectomy and patch angioplasty:The mid right SFA was angioplastied with a 4 x 8  LOWER EXTREMITY ANGIOGRAM  2003   left common iliac artery stenting by Dr. Adora Fridge   LOWER EXTREMITY ANGIOGRAPHY Right 04/20/2017   Procedure: Lower Extremity Angiography;  Surgeon: Serafina Mitchell, MD;  Location: [place] [facility];  Service: Cardiovascular;  Laterality: Right;   NM MYOVIEW LTD  2015   PERIPHERAL  VASCULAR ATHERECTOMY Right 12/08/2016   Procedure: Peripheral Vascular Atherectomy;  Surgeon: Serafina Mitchell, MD;  Location: [place] [facility];  Service: Cardiovascular;  Laterality: Right;   PERIPHERAL VASCULAR BALLOON ANGIOPLASTY Right 12/08/2016   Procedure: Peripheral Vascular Balloon Angioplasty;  Surgeon: Serafina Mitchell, MD;  Location: [place] [facility];  Service: Cardiovascular;  Laterality: Right;   PERIPHERAL VASCULAR BALLOON ANGIOPLASTY Right 04/20/2017   Procedure: PERIPHERAL VASCULAR BALLOON ANGIOPLASTY;  Surgeon: Serafina Mitchell, MD;  Location: [place] [facility];  Service: Cardiovascular;  Laterality: Right;  SFA   PERIPHERAL VASCULAR BALLOON ANGIOPLASTY  03/31/2022   Procedure: PERIPHERAL VASCULAR BALLOON ANGIOPLASTY;  Surgeon: Serafina Mitchell, MD;  Location: [place] [facility];  Service: Cardiovascular;;  Right PT   PERIPHERAL VASCULAR BALLOON ANGIOPLASTY  09/29/2022   Procedure: PERIPHERAL VASCULAR BALLOON ANGIOPLASTY;  Surgeon: Serafina Mitchell, MD;  Location: [place] [facility];  Service: Cardiovascular;;  Rt PT and Peroneal   PERIPHERAL VASCULAR CATHETERIZATION N/A 09/22/2016   Procedure: Abdominal Aortogram w/ bilateral Lower Extremity Runoff;  Surgeon: Serafina Mitchell, MD;  Location: [place] [facility];  Service: Cardiovascular;  Laterality: N/A;   PERIPHERAL VASCULAR CATHETERIZATION Right 09/22/2016   Procedure: Peripheral Vascular Intervention;  Surgeon: Serafina Mitchell, MD;  Location: [place] [facility];  Service: Cardiovascular;  Laterality: Right;   PERIPHERAL VASCULAR INTERVENTION  09/29/2022   Procedure: PERIPHERAL VASCULAR INTERVENTION;  Surgeon: Serafina Mitchell, MD;  Location: [place] [facility];  Service: Cardiovascular;;  Rt Popliteal   TRANSCAROTID ARTERY REVASCULARIZATION  Right 12/26/2021   Procedure: Right Transcarotid Artery Revascularization;  Surgeon: Serafina Mitchell, MD;  Location[facility] OR;  Service: Vascular;  Laterality: Right;   ULTRASOUND GUIDANCE FOR VASCULAR  ACCESS Left 12/26/2021   Procedure: ULTRASOUND GUIDANCE FOR VASCULAR ACCESS, LEFT FEMORAL VEIN;  Surgeon: Serafina Mitchell, MD;  Location: [facility] OR;  Service: Vascular;  Laterality: Left;    Social History   Socioeconomic History   Marital status: Single    Spouse name: Not on file   Number of children: Not on file   Years of education: Not on file   Highest education level: Not on file  Occupational History   Occupation: Retired [employer]  Tobacco Use   Smoking status: Former    Packs/day: 0    Types: Cigarettes    Quit date: 08/07/2016    Years since quitting: 6.2   Smokeless tobacco: Current    Types: Chew  Vaping Use   Vaping Use: Never used  Substance and Sexual Activity   Alcohol use: No    Alcohol/week: 0.0 standard drinks of alcohol    Comment: Weekend drinker, quit 2000. NIGHTLY [place]   Drug use: No   Sexual activity: Not on file  Other Topics Concern   Not on file  Social History Narrative   Lives in [place]. Has 2 roommates, they both smoke. Has 9 dogs.   Social Determinants of Health   Financial Resource Strain: Not on file  Food Insecurity: Not on file  Transportation Needs: Not on file  Physical Activity: Not on file  Stress: Not on file  Social Connections: Not  on file  Intimate Partner Violence: Not on file    Family History  Problem Relation Age of Onset   Heart attack Mother    CVA Mother    Heart disease Mother    Diabetes Sister    Hypertension Sister     Current Outpatient Medications  Medication Sig Dispense Refill   acetaminophen (TYLENOL) 650 MG CR tablet Take 1,300 mg by mouth every 8 (eight) hours as needed for pain.     albuterol (VENTOLIN HFA) 108 (90 Base) MCG/ACT inhaler Inhale 2 puffs into the lungs every 4 (four) hours as needed for shortness of breath or wheezing.     apixaban (ELIQUIS) 2.5 MG TABS tablet Take 1 tablet (2.5 mg total) by mouth 2 (two) times daily. 180 tablet 0   aspirin 81 MG EC tablet Take 1 tablet (81 mg total)  by mouth daily. Swallow whole. 30 tablet 0   Blood Glucose Monitoring Suppl (BLOOD GLUCOSE MONITOR SYSTEM) w/Device KIT Used to test blood sugars 4 times daily 1 kit 0   clopidogrel (PLAVIX) 75 MG tablet Take 1 tablet (75 mg total) by mouth daily. 30 tablet 11   Cyanocobalamin (VITAMIN B-12 PO) Take 1 tablet by mouth daily.     ferrous sulfate 325 (65 FE) MG tablet Take 325 mg by mouth daily with breakfast.     Insulin Pen Needle 32G X 4 MM MISC Use to inject insulin 4 times daily. 100 each 0   metFORMIN (GLUCOPHAGE) 500 MG tablet Take 1,000 mg by mouth 2 (two) times daily with a meal.     metoprolol succinate (TOPROL-XL) 25 MG 24 hr tablet TAKE 1 TABLET(25 MG) BY MOUTH DAILY 30 tablet 10   Multiple Vitamins-Minerals (CENTRUM SILVER 50+MEN) TABS Take 1 tablet by mouth daily.     NITROSTAT 0.4 MG SL tablet Take 0.4 mg by mouth every 5 (five) minutes as needed for chest pain.  0   omeprazole (PRILOSEC) 20 MG capsule Take 20 mg by mouth daily.     oxymetazoline (AFRIN) 0.05 % nasal spray Place 1 spray into both nostrils 2 (two) times daily as needed for congestion. Mucinex brand     potassium chloride (K-DUR) 10 MEQ tablet Take 10 mEq by mouth daily with breakfast.     rosuvastatin (CRESTOR) 20 MG tablet Take 1 tablet (20 mg total) by mouth daily. 30 tablet 0   SPIRIVA HANDIHALER 18 MCG inhalation capsule Place 18 mcg into inhaler and inhale at bedtime.     traMADol (ULTRAM) 50 MG tablet Take 50 mg by mouth 3 (three) times daily as needed for pain.     No current facility-administered medications for this visit.    Allergies  Allergen Reactions   Actos [Pioglitazone] Shortness Of Breath    Leg swelling    Lipitor [Atorvastatin] Other (See Comments)    Leg pain    Zestril [Lisinopril] Cough     REVIEW OF SYSTEMS:  [X]  denotes positive finding, [ ]  denotes negative finding Cardiac  Comments:  Chest pain or chest pressure:    Shortness of breath upon exertion:    Short of breath when  lying flat:    Irregular heart rhythm:        Vascular    Pain in calf, thigh, or hip brought on by ambulation:    Pain in feet at night that wakes you up from your sleep:     Blood clot in your veins:    Leg swelling:  Pulmonary    Oxygen at home:    Productive cough:     Wheezing:         Neurologic    Sudden weakness in arms or legs:     Sudden numbness in arms or legs:     Sudden onset of difficulty speaking or slurred speech:    Temporary loss of vision in one eye:     Problems with dizziness:         Gastrointestinal    Blood in stool:     Vomited blood:         Genitourinary    Burning when urinating:     Blood in urine:        Psychiatric    Major depression:         Hematologic    Bleeding problems:    Problems with blood clotting too easily:        Skin    Rashes or ulcers:        Constitutional    Fever or chills:      PHYSICAL EXAMINATION:  There were no vitals filed for this visit.  General:  WDWN in NAD; vital signs documented above Gait: Not observed HENT: WNL, normocephalic Pulmonary: normal non-labored breathing , without Rales, rhonchi,  wheezing Cardiac: {Desc; regular/irreg:14544} HR Abdomen: soft, NT, no masses Skin: {With/Without:20273} rashes Vascular Exam/Pulses: *** Extremities: {With/Without:20273} ischemic changes, {With/Without:20273} Gangrene , {With/Without:20273} cellulitis; {With/Without:20273} open wounds;  Musculoskeletal: no muscle wasting or atrophy  Neurologic: A&O X 3*** Psychiatric:  The pt has {Desc; normal/abnormal:11317::"Normal"} affect.   Non-Invasive Vascular Imaging:   +-------+-----------+-----------+------------+------------+  ABI/TBIToday's ABIToday's TBIPrevious ABIPrevious TBI  +-------+-----------+-----------+------------+------------+  Right 0.80       0.48       0.79        0.54          +-------+-----------+-----------+------------+------------+  Left  0.52       0.43        0.54        0.50          +-------+-----------+-----------+------------+------------+   VAS [place] Lower Extremity Duplex:  +-----------+--------+-----+---------------+--------+--------+  RIGHT     PSV cm/sRatioStenosis       WaveformComments  +-----------+--------+-----+---------------+--------+--------+  CFA Prox   284          50-74% stenosisbiphasic          +-----------+--------+-----+---------------+--------+--------+  DFA       240          50-74% stenosisbiphasic          +-----------+--------+-----+---------------+--------+--------+  ATA Distal 25                          biphasic          +-----------+--------+-----+---------------+--------+--------+  PTA Distal 46                          biphasic          +-----------+--------+-----+---------------+--------+--------+  PERO Distal51                          biphasic          +-----------+--------+-----+---------------+--------+--------+   Right Stent(s):  +---------------+--------+---------------+--------+--------+  SFA           PSV cm/sStenosis       WaveformComments  +---------------+--------+---------------+--------+--------+  Prox to Stent  281  biphasic          +---------------+--------+---------------+--------+--------+  Proximal Stent 256     50-99% stenosisbiphasic          +---------------+--------+---------------+--------+--------+  Mid Stent      41                     biphasic          +---------------+--------+---------------+--------+--------+  Distal Stent   42                     biphasic          +---------------+--------+---------------+--------+--------+  Distal to Stent32                     biphasic          +---------------+--------+---------------+--------+--------+    +---------------+--------+--------+--------+--------+  Popliteal     PSV cm/sStenosisWaveformComments   +---------------+--------+--------+--------+--------+  Prox to Stent  31              biphasic          +---------------+--------+--------+--------+--------+  Proximal Stent 35              biphasic          +---------------+--------+--------+--------+--------+  Mid Stent      27              biphasic          +---------------+--------+--------+--------+--------+  Distal Stent   22              biphasic          +---------------+--------+--------+--------+--------+  Distal to Stent52              biphasic          +---------------+--------+--------+--------+--------+   Summary:  Right: 50-74% stenosis noted in the common femoral artery. 50-74% stenosis noted in the deep femoral artery. Stenosis is noted within the proximal SFA stent. Patent stent with no evidence of stenosis in the popliteal artery artery    ASSESSMENT/PLAN:: 72 y.o. male here for follow up for PAD.   -Repeat Carotid Duplex already scheduled for January of 2025   Karoline Caldwell, [facility] [phone]  On call MD:   Carlis Abbott

## 2022-11-24 ENCOUNTER — Other Ambulatory Visit: Payer: Self-pay

## 2022-11-24 ENCOUNTER — Encounter: Payer: Self-pay | Admitting: Physician Assistant

## 2022-11-24 DIAGNOSIS — I739 Peripheral vascular disease, unspecified: Secondary | ICD-10-CM

## 2022-11-24 DIAGNOSIS — I70233 Atherosclerosis of native arteries of right leg with ulceration of ankle: Secondary | ICD-10-CM

## 2022-11-24 DIAGNOSIS — I70213 Atherosclerosis of native arteries of extremities with intermittent claudication, bilateral legs: Secondary | ICD-10-CM

## 2022-12-14 ENCOUNTER — Telehealth: Payer: Self-pay | Admitting: Podiatry

## 2022-12-14 NOTE — Telephone Encounter (Signed)
Left message for pt that I did get his message and the appt has been cxled. He left message on 4.19 @ 324 stating to cxl appt due to transportation and funds.

## 2022-12-15 ENCOUNTER — Ambulatory Visit: Payer: Medicare Other | Admitting: Podiatry

## 2023-01-14 ENCOUNTER — Emergency Department (HOSPITAL_COMMUNITY): Payer: Medicare Other

## 2023-01-14 ENCOUNTER — Observation Stay (HOSPITAL_COMMUNITY): Payer: Medicare Other

## 2023-01-14 ENCOUNTER — Other Ambulatory Visit: Payer: Self-pay

## 2023-01-14 ENCOUNTER — Encounter (HOSPITAL_COMMUNITY): Payer: Self-pay | Admitting: Internal Medicine

## 2023-01-14 ENCOUNTER — Inpatient Hospital Stay (HOSPITAL_COMMUNITY)
Admission: EM | Admit: 2023-01-14 | Discharge: 2023-02-01 | DRG: 981 | Disposition: A | Discharge status: Hospice | Payer: Medicare Other | Attending: Internal Medicine | Admitting: Internal Medicine

## 2023-01-14 DIAGNOSIS — J690 Pneumonitis due to inhalation of food and vomit: Secondary | ICD-10-CM | POA: Diagnosis not present

## 2023-01-14 DIAGNOSIS — Z8673 Personal history of transient ischemic attack (TIA), and cerebral infarction without residual deficits: Secondary | ICD-10-CM

## 2023-01-14 DIAGNOSIS — G9341 Metabolic encephalopathy: Secondary | ICD-10-CM | POA: Diagnosis not present

## 2023-01-14 DIAGNOSIS — Z95820 Peripheral vascular angioplasty status with implants and grafts: Secondary | ICD-10-CM | POA: Diagnosis not present

## 2023-01-14 DIAGNOSIS — J449 Chronic obstructive pulmonary disease, unspecified: Secondary | ICD-10-CM | POA: Diagnosis present

## 2023-01-14 DIAGNOSIS — K5900 Constipation, unspecified: Secondary | ICD-10-CM | POA: Diagnosis not present

## 2023-01-14 DIAGNOSIS — M6281 Muscle weakness (generalized): Secondary | ICD-10-CM | POA: Diagnosis not present

## 2023-01-14 DIAGNOSIS — N179 Acute kidney failure, unspecified: Secondary | ICD-10-CM | POA: Diagnosis not present

## 2023-01-14 DIAGNOSIS — I7090 Unspecified atherosclerosis: Secondary | ICD-10-CM | POA: Diagnosis not present

## 2023-01-14 DIAGNOSIS — Z789 Other specified health status: Secondary | ICD-10-CM | POA: Diagnosis not present

## 2023-01-14 DIAGNOSIS — R109 Unspecified abdominal pain: Secondary | ICD-10-CM | POA: Diagnosis not present

## 2023-01-14 DIAGNOSIS — J9601 Acute respiratory failure with hypoxia: Secondary | ICD-10-CM | POA: Diagnosis not present

## 2023-01-14 DIAGNOSIS — E119 Type 2 diabetes mellitus without complications: Secondary | ICD-10-CM | POA: Diagnosis not present

## 2023-01-14 DIAGNOSIS — R935 Abnormal findings on diagnostic imaging of other abdominal regions, including retroperitoneum: Secondary | ICD-10-CM | POA: Diagnosis not present

## 2023-01-14 DIAGNOSIS — I2510 Atherosclerotic heart disease of native coronary artery without angina pectoris: Secondary | ICD-10-CM | POA: Diagnosis not present

## 2023-01-14 DIAGNOSIS — K8020 Calculus of gallbladder without cholecystitis without obstruction: Secondary | ICD-10-CM | POA: Diagnosis not present

## 2023-01-14 DIAGNOSIS — N1831 Chronic kidney disease, stage 3a: Secondary | ICD-10-CM | POA: Diagnosis present

## 2023-01-14 DIAGNOSIS — E222 Syndrome of inappropriate secretion of antidiuretic hormone: Secondary | ICD-10-CM | POA: Diagnosis present

## 2023-01-14 DIAGNOSIS — R29898 Other symptoms and signs involving the musculoskeletal system: Secondary | ICD-10-CM

## 2023-01-14 DIAGNOSIS — J90 Pleural effusion, not elsewhere classified: Secondary | ICD-10-CM | POA: Diagnosis not present

## 2023-01-14 DIAGNOSIS — L97419 Non-pressure chronic ulcer of right heel and midfoot with unspecified severity: Secondary | ICD-10-CM | POA: Diagnosis present

## 2023-01-14 DIAGNOSIS — R21 Rash and other nonspecific skin eruption: Secondary | ICD-10-CM | POA: Diagnosis present

## 2023-01-14 DIAGNOSIS — E8729 Other acidosis: Secondary | ICD-10-CM | POA: Diagnosis present

## 2023-01-14 DIAGNOSIS — R4781 Slurred speech: Secondary | ICD-10-CM | POA: Diagnosis not present

## 2023-01-14 DIAGNOSIS — Z681 Body mass index (BMI) 19 or less, adult: Secondary | ICD-10-CM | POA: Diagnosis not present

## 2023-01-14 DIAGNOSIS — R339 Retention of urine, unspecified: Secondary | ICD-10-CM | POA: Diagnosis not present

## 2023-01-14 DIAGNOSIS — G928 Other toxic encephalopathy: Secondary | ICD-10-CM | POA: Diagnosis not present

## 2023-01-14 DIAGNOSIS — M545 Low back pain, unspecified: Secondary | ICD-10-CM | POA: Diagnosis not present

## 2023-01-14 DIAGNOSIS — I480 Paroxysmal atrial fibrillation: Secondary | ICD-10-CM | POA: Diagnosis present

## 2023-01-14 DIAGNOSIS — K838 Other specified diseases of biliary tract: Secondary | ICD-10-CM | POA: Diagnosis not present

## 2023-01-14 DIAGNOSIS — G8918 Other acute postprocedural pain: Secondary | ICD-10-CM | POA: Diagnosis not present

## 2023-01-14 DIAGNOSIS — R932 Abnormal findings on diagnostic imaging of liver and biliary tract: Secondary | ICD-10-CM | POA: Diagnosis not present

## 2023-01-14 DIAGNOSIS — Z515 Encounter for palliative care: Secondary | ICD-10-CM

## 2023-01-14 DIAGNOSIS — R29818 Other symptoms and signs involving the nervous system: Secondary | ICD-10-CM | POA: Diagnosis not present

## 2023-01-14 DIAGNOSIS — R531 Weakness: Secondary | ICD-10-CM | POA: Diagnosis not present

## 2023-01-14 DIAGNOSIS — R0602 Shortness of breath: Secondary | ICD-10-CM | POA: Diagnosis not present

## 2023-01-14 DIAGNOSIS — R64 Cachexia: Secondary | ICD-10-CM | POA: Diagnosis present

## 2023-01-14 DIAGNOSIS — Z888 Allergy status to other drugs, medicaments and biological substances status: Secondary | ICD-10-CM

## 2023-01-14 DIAGNOSIS — R112 Nausea with vomiting, unspecified: Secondary | ICD-10-CM | POA: Diagnosis not present

## 2023-01-14 DIAGNOSIS — I70261 Atherosclerosis of native arteries of extremities with gangrene, right leg: Secondary | ICD-10-CM | POA: Diagnosis present

## 2023-01-14 DIAGNOSIS — R627 Adult failure to thrive: Secondary | ICD-10-CM | POA: Diagnosis present

## 2023-01-14 DIAGNOSIS — Z7984 Long term (current) use of oral hypoglycemic drugs: Secondary | ICD-10-CM

## 2023-01-14 DIAGNOSIS — R0600 Dyspnea, unspecified: Secondary | ICD-10-CM | POA: Diagnosis not present

## 2023-01-14 DIAGNOSIS — Z95828 Presence of other vascular implants and grafts: Secondary | ICD-10-CM | POA: Diagnosis not present

## 2023-01-14 DIAGNOSIS — N200 Calculus of kidney: Secondary | ICD-10-CM | POA: Diagnosis not present

## 2023-01-14 DIAGNOSIS — Z66 Do not resuscitate: Secondary | ICD-10-CM | POA: Diagnosis not present

## 2023-01-14 DIAGNOSIS — Z89611 Acquired absence of right leg above knee: Secondary | ICD-10-CM | POA: Diagnosis not present

## 2023-01-14 DIAGNOSIS — K8070 Calculus of gallbladder and bile duct without cholecystitis without obstruction: Secondary | ICD-10-CM | POA: Diagnosis present

## 2023-01-14 DIAGNOSIS — Z7901 Long term (current) use of anticoagulants: Secondary | ICD-10-CM | POA: Diagnosis not present

## 2023-01-14 DIAGNOSIS — R54 Age-related physical debility: Secondary | ICD-10-CM | POA: Diagnosis present

## 2023-01-14 DIAGNOSIS — E1152 Type 2 diabetes mellitus with diabetic peripheral angiopathy with gangrene: Secondary | ICD-10-CM | POA: Diagnosis present

## 2023-01-14 DIAGNOSIS — R410 Disorientation, unspecified: Secondary | ICD-10-CM | POA: Diagnosis not present

## 2023-01-14 DIAGNOSIS — I739 Peripheral vascular disease, unspecified: Secondary | ICD-10-CM | POA: Diagnosis not present

## 2023-01-14 DIAGNOSIS — E785 Hyperlipidemia, unspecified: Secondary | ICD-10-CM | POA: Diagnosis present

## 2023-01-14 DIAGNOSIS — Z833 Family history of diabetes mellitus: Secondary | ICD-10-CM

## 2023-01-14 DIAGNOSIS — E1151 Type 2 diabetes mellitus with diabetic peripheral angiopathy without gangrene: Secondary | ICD-10-CM | POA: Diagnosis not present

## 2023-01-14 DIAGNOSIS — Z951 Presence of aortocoronary bypass graft: Secondary | ICD-10-CM | POA: Diagnosis not present

## 2023-01-14 DIAGNOSIS — R001 Bradycardia, unspecified: Secondary | ICD-10-CM | POA: Diagnosis not present

## 2023-01-14 DIAGNOSIS — R5381 Other malaise: Secondary | ICD-10-CM | POA: Diagnosis not present

## 2023-01-14 DIAGNOSIS — E1122 Type 2 diabetes mellitus with diabetic chronic kidney disease: Secondary | ICD-10-CM | POA: Diagnosis present

## 2023-01-14 DIAGNOSIS — Z7902 Long term (current) use of antithrombotics/antiplatelets: Secondary | ICD-10-CM

## 2023-01-14 DIAGNOSIS — I70235 Atherosclerosis of native arteries of right leg with ulceration of other part of foot: Secondary | ICD-10-CM | POA: Diagnosis not present

## 2023-01-14 DIAGNOSIS — E43 Unspecified severe protein-calorie malnutrition: Secondary | ICD-10-CM | POA: Diagnosis present

## 2023-01-14 DIAGNOSIS — Z01818 Encounter for other preprocedural examination: Secondary | ICD-10-CM | POA: Diagnosis not present

## 2023-01-14 DIAGNOSIS — I10 Essential (primary) hypertension: Secondary | ICD-10-CM | POA: Diagnosis not present

## 2023-01-14 DIAGNOSIS — Z823 Family history of stroke: Secondary | ICD-10-CM

## 2023-01-14 DIAGNOSIS — E872 Acidosis, unspecified: Secondary | ICD-10-CM | POA: Diagnosis not present

## 2023-01-14 DIAGNOSIS — Z79899 Other long term (current) drug therapy: Secondary | ICD-10-CM

## 2023-01-14 DIAGNOSIS — L859 Epidermal thickening, unspecified: Secondary | ICD-10-CM | POA: Diagnosis not present

## 2023-01-14 DIAGNOSIS — Z87891 Personal history of nicotine dependence: Secondary | ICD-10-CM | POA: Diagnosis not present

## 2023-01-14 DIAGNOSIS — I129 Hypertensive chronic kidney disease with stage 1 through stage 4 chronic kidney disease, or unspecified chronic kidney disease: Secondary | ICD-10-CM | POA: Diagnosis present

## 2023-01-14 DIAGNOSIS — R079 Chest pain, unspecified: Secondary | ICD-10-CM | POA: Diagnosis not present

## 2023-01-14 DIAGNOSIS — K3189 Other diseases of stomach and duodenum: Secondary | ICD-10-CM | POA: Diagnosis not present

## 2023-01-14 DIAGNOSIS — F17200 Nicotine dependence, unspecified, uncomplicated: Secondary | ICD-10-CM | POA: Diagnosis not present

## 2023-01-14 DIAGNOSIS — E11621 Type 2 diabetes mellitus with foot ulcer: Secondary | ICD-10-CM | POA: Diagnosis present

## 2023-01-14 DIAGNOSIS — I5030 Unspecified diastolic (congestive) heart failure: Secondary | ICD-10-CM | POA: Diagnosis not present

## 2023-01-14 DIAGNOSIS — M7989 Other specified soft tissue disorders: Secondary | ICD-10-CM | POA: Diagnosis not present

## 2023-01-14 DIAGNOSIS — Z7401 Bed confinement status: Secondary | ICD-10-CM | POA: Diagnosis not present

## 2023-01-14 DIAGNOSIS — K8050 Calculus of bile duct without cholangitis or cholecystitis without obstruction: Secondary | ICD-10-CM | POA: Diagnosis not present

## 2023-01-14 DIAGNOSIS — Z7189 Other specified counseling: Secondary | ICD-10-CM | POA: Diagnosis not present

## 2023-01-14 DIAGNOSIS — Z7982 Long term (current) use of aspirin: Secondary | ICD-10-CM

## 2023-01-14 DIAGNOSIS — R110 Nausea: Secondary | ICD-10-CM | POA: Diagnosis not present

## 2023-01-14 DIAGNOSIS — R0689 Other abnormalities of breathing: Secondary | ICD-10-CM | POA: Diagnosis not present

## 2023-01-14 DIAGNOSIS — H9190 Unspecified hearing loss, unspecified ear: Secondary | ICD-10-CM | POA: Diagnosis present

## 2023-01-14 DIAGNOSIS — Z8249 Family history of ischemic heart disease and other diseases of the circulatory system: Secondary | ICD-10-CM

## 2023-01-14 DIAGNOSIS — I2582 Chronic total occlusion of coronary artery: Secondary | ICD-10-CM | POA: Diagnosis present

## 2023-01-14 DIAGNOSIS — I700 Atherosclerosis of aorta: Secondary | ICD-10-CM | POA: Diagnosis not present

## 2023-01-14 LAB — RAPID URINE DRUG SCREEN, HOSP PERFORMED
Amphetamines: NOT DETECTED
Barbiturates: NOT DETECTED
Benzodiazepines: NOT DETECTED
Cocaine: NOT DETECTED
Opiates: NOT DETECTED
Tetrahydrocannabinol: NOT DETECTED

## 2023-01-14 LAB — URINALYSIS, ROUTINE W REFLEX MICROSCOPIC
Bacteria, UA: NONE SEEN
Bilirubin Urine: NEGATIVE
Glucose, UA: NEGATIVE mg/dL
Ketones, ur: 5 mg/dL — AB
Leukocytes,Ua: NEGATIVE
Nitrite: NEGATIVE
Protein, ur: 100 mg/dL — AB
Specific Gravity, Urine: 1.017 (ref 1.005–1.030)
pH: 5 (ref 5.0–8.0)

## 2023-01-14 LAB — I-STAT VENOUS BLOOD GAS, ED
Acid-base deficit: 9 mmol/L — ABNORMAL HIGH (ref 0.0–2.0)
Bicarbonate: 15.8 mmol/L — ABNORMAL LOW (ref 20.0–28.0)
Calcium, Ion: 1.18 mmol/L (ref 1.15–1.40)
HCT: 30 % — ABNORMAL LOW (ref 39.0–52.0)
Hemoglobin: 10.2 g/dL — ABNORMAL LOW (ref 13.0–17.0)
O2 Saturation: 82 %
Potassium: 4.1 mmol/L (ref 3.5–5.1)
Sodium: 129 mmol/L — ABNORMAL LOW (ref 135–145)
TCO2: 17 mmol/L — ABNORMAL LOW (ref 22–32)
pCO2, Ven: 29.4 mmHg — ABNORMAL LOW (ref 44–60)
pH, Ven: 7.339 (ref 7.25–7.43)
pO2, Ven: 48 mmHg — ABNORMAL HIGH (ref 32–45)

## 2023-01-14 LAB — COMPREHENSIVE METABOLIC PANEL
ALT: 17 U/L (ref 0–44)
AST: 37 U/L (ref 15–41)
Albumin: 3.6 g/dL (ref 3.5–5.0)
Alkaline Phosphatase: 64 U/L (ref 38–126)
Anion gap: 17 — ABNORMAL HIGH (ref 5–15)
BUN: 40 mg/dL — ABNORMAL HIGH (ref 8–23)
CO2: 13 mmol/L — ABNORMAL LOW (ref 22–32)
Calcium: 9.3 mg/dL (ref 8.9–10.3)
Chloride: 98 mmol/L (ref 98–111)
Creatinine, Ser: 1.29 mg/dL — ABNORMAL HIGH (ref 0.61–1.24)
GFR, Estimated: 58 mL/min — ABNORMAL LOW (ref >60)
Glucose, Bld: 192 mg/dL — ABNORMAL HIGH (ref 70–99)
Potassium: 4.7 mmol/L (ref 3.5–5.1)
Sodium: 128 mmol/L — ABNORMAL LOW (ref 135–145)
Total Bilirubin: 0.9 mg/dL (ref 0.3–1.2)
Total Protein: 6.7 g/dL (ref 6.5–8.1)

## 2023-01-14 LAB — DIFFERENTIAL
Abs Immature Granulocytes: 0.08 10*3/uL — ABNORMAL HIGH (ref 0.00–0.07)
Basophils Absolute: 0 10*3/uL (ref 0.0–0.1)
Basophils Relative: 0 %
Eosinophils Absolute: 0 10*3/uL (ref 0.0–0.5)
Eosinophils Relative: 1 %
Immature Granulocytes: 1 %
Lymphocytes Relative: 7 %
Lymphs Abs: 0.5 10*3/uL — ABNORMAL LOW (ref 0.7–4.0)
Monocytes Absolute: 0.6 10*3/uL (ref 0.1–1.0)
Monocytes Relative: 10 %
Neutro Abs: 5.3 10*3/uL (ref 1.7–7.7)
Neutrophils Relative %: 81 %

## 2023-01-14 LAB — I-STAT CHEM 8, ED
BUN: 47 mg/dL — ABNORMAL HIGH (ref 8–23)
Calcium, Ion: 1.09 mmol/L — ABNORMAL LOW (ref 1.15–1.40)
Chloride: 105 mmol/L (ref 98–111)
Creatinine, Ser: 1.2 mg/dL (ref 0.61–1.24)
Glucose, Bld: 192 mg/dL — ABNORMAL HIGH (ref 70–99)
HCT: 31 % — ABNORMAL LOW (ref 39.0–52.0)
Hemoglobin: 10.5 g/dL — ABNORMAL LOW (ref 13.0–17.0)
Potassium: 4.7 mmol/L (ref 3.5–5.1)
Sodium: 128 mmol/L — ABNORMAL LOW (ref 135–145)
TCO2: 15 mmol/L — ABNORMAL LOW (ref 22–32)

## 2023-01-14 LAB — CBC
HCT: 30.3 % — ABNORMAL LOW (ref 39.0–52.0)
Hemoglobin: 9.4 g/dL — ABNORMAL LOW (ref 13.0–17.0)
MCH: 28.2 pg (ref 26.0–34.0)
MCHC: 31 g/dL (ref 30.0–36.0)
MCV: 91 fL (ref 80.0–100.0)
Platelets: 271 10*3/uL (ref 150–400)
RBC: 3.33 MIL/uL — ABNORMAL LOW (ref 4.22–5.81)
RDW: 14.5 % (ref 11.5–15.5)
WBC: 6.5 10*3/uL (ref 4.0–10.5)
nRBC: 0 % (ref 0.0–0.2)

## 2023-01-14 LAB — GLUCOSE, CAPILLARY: Glucose-Capillary: 222 mg/dL — ABNORMAL HIGH (ref 70–99)

## 2023-01-14 LAB — PROTIME-INR
INR: 1.2 (ref 0.8–1.2)
Prothrombin Time: 15.4 seconds — ABNORMAL HIGH (ref 11.4–15.2)

## 2023-01-14 LAB — TROPONIN I (HIGH SENSITIVITY)
Troponin I (High Sensitivity): 16 ng/L (ref <18)
Troponin I (High Sensitivity): 16 ng/L (ref <18)

## 2023-01-14 LAB — APTT: aPTT: 31 seconds (ref 24–36)

## 2023-01-14 LAB — LIPASE, BLOOD: Lipase: 22 U/L (ref 11–51)

## 2023-01-14 LAB — ETHANOL: Alcohol, Ethyl (B): <10 mg/dL (ref <10)

## 2023-01-14 MED ORDER — HYDRALAZINE HCL 20 MG/ML IJ SOLN
5.0000 mg | INTRAMUSCULAR | Status: DC | PRN
  Administered 2023-01-16: 5 mg via INTRAVENOUS
  Filled 2023-01-14: qty 1

## 2023-01-14 MED ORDER — INSULIN ASPART 100 UNIT/ML IJ SOLN
0.0000 [IU] | Freq: Every day | INTRAMUSCULAR | Status: DC
  Administered 2023-01-14: 2 [IU] via SUBCUTANEOUS
  Administered 2023-01-19: 5 [IU] via SUBCUTANEOUS
  Administered 2023-01-20 – 2023-01-21 (×2): 3 [IU] via SUBCUTANEOUS
  Administered 2023-01-22 – 2023-01-25 (×2): 2 [IU] via SUBCUTANEOUS
  Administered 2023-01-27: 3 [IU] via SUBCUTANEOUS
  Administered 2023-01-29: 2 [IU] via SUBCUTANEOUS

## 2023-01-14 MED ORDER — INSULIN ASPART 100 UNIT/ML IJ SOLN
0.0000 [IU] | Freq: Three times a day (TID) | INTRAMUSCULAR | Status: DC
  Administered 2023-01-15 (×2): 3 [IU] via SUBCUTANEOUS
  Administered 2023-01-15: 8 [IU] via SUBCUTANEOUS
  Administered 2023-01-16: 11 [IU] via SUBCUTANEOUS
  Administered 2023-01-16 (×2): 8 [IU] via SUBCUTANEOUS
  Administered 2023-01-17: 5 [IU] via SUBCUTANEOUS
  Administered 2023-01-17: 3 [IU] via SUBCUTANEOUS
  Administered 2023-01-17: 11 [IU] via SUBCUTANEOUS
  Administered 2023-01-18: 15 [IU] via SUBCUTANEOUS
  Administered 2023-01-18: 3 [IU] via SUBCUTANEOUS
  Administered 2023-01-18: 11 [IU] via SUBCUTANEOUS
  Administered 2023-01-19: 8 [IU] via SUBCUTANEOUS
  Administered 2023-01-19 – 2023-01-20 (×4): 5 [IU] via SUBCUTANEOUS
  Administered 2023-01-21: 8 [IU] via SUBCUTANEOUS
  Administered 2023-01-21: 2 [IU] via SUBCUTANEOUS
  Administered 2023-01-21: 3 [IU] via SUBCUTANEOUS
  Administered 2023-01-22: 8 [IU] via SUBCUTANEOUS
  Administered 2023-01-22 (×2): 11 [IU] via SUBCUTANEOUS
  Administered 2023-01-23: 3 [IU] via SUBCUTANEOUS
  Administered 2023-01-23: 11 [IU] via SUBCUTANEOUS
  Administered 2023-01-24: 3 [IU] via SUBCUTANEOUS
  Administered 2023-01-24: 5 [IU] via SUBCUTANEOUS
  Administered 2023-01-25: 11 [IU] via SUBCUTANEOUS
  Administered 2023-01-25 – 2023-01-26 (×2): 5 [IU] via SUBCUTANEOUS
  Administered 2023-01-28 (×2): 3 [IU] via SUBCUTANEOUS
  Administered 2023-01-28: 11 [IU] via SUBCUTANEOUS
  Administered 2023-01-29: 2 [IU] via SUBCUTANEOUS
  Administered 2023-01-29: 3 [IU] via SUBCUTANEOUS
  Administered 2023-01-29: 5 [IU] via SUBCUTANEOUS

## 2023-01-14 MED ORDER — PANTOPRAZOLE SODIUM 40 MG PO TBEC
40.0000 mg | DELAYED_RELEASE_TABLET | Freq: Every day | ORAL | Status: DC
  Administered 2023-01-15 – 2023-01-29 (×14): 40 mg via ORAL
  Filled 2023-01-14 (×15): qty 1

## 2023-01-14 MED ORDER — LACTATED RINGERS IV BOLUS
1000.0000 mL | Freq: Once | INTRAVENOUS | Status: AC
  Administered 2023-01-14: 1000 mL via INTRAVENOUS

## 2023-01-14 MED ORDER — APIXABAN 2.5 MG PO TABS
2.5000 mg | ORAL_TABLET | Freq: Two times a day (BID) | ORAL | Status: DC
  Administered 2023-01-14 – 2023-01-15 (×2): 2.5 mg via ORAL
  Filled 2023-01-14 (×2): qty 1

## 2023-01-14 MED ORDER — ASPIRIN 81 MG PO TBEC
81.0000 mg | DELAYED_RELEASE_TABLET | Freq: Every day | ORAL | Status: DC
  Administered 2023-01-14 – 2023-01-22 (×8): 81 mg via ORAL
  Filled 2023-01-14 (×9): qty 1

## 2023-01-14 MED ORDER — ACETAMINOPHEN 160 MG/5ML PO SOLN
650.0000 mg | ORAL | Status: DC | PRN
  Administered 2023-01-15 – 2023-01-16 (×2): 650 mg
  Filled 2023-01-14 (×2): qty 20.3

## 2023-01-14 MED ORDER — CLOPIDOGREL BISULFATE 75 MG PO TABS
75.0000 mg | ORAL_TABLET | Freq: Every day | ORAL | Status: DC
  Administered 2023-01-15: 75 mg via ORAL
  Filled 2023-01-14: qty 1

## 2023-01-14 MED ORDER — UMECLIDINIUM BROMIDE 62.5 MCG/ACT IN AEPB
1.0000 | INHALATION_SPRAY | Freq: Every day | RESPIRATORY_TRACT | Status: DC
  Administered 2023-01-14 – 2023-01-25 (×8): 1 via RESPIRATORY_TRACT
  Filled 2023-01-14 (×3): qty 7

## 2023-01-14 MED ORDER — ALBUTEROL SULFATE (2.5 MG/3ML) 0.083% IN NEBU
2.5000 mg | INHALATION_SOLUTION | RESPIRATORY_TRACT | Status: DC | PRN
  Administered 2023-01-30: 2.5 mg via RESPIRATORY_TRACT
  Filled 2023-01-14: qty 3

## 2023-01-14 MED ORDER — STROKE: EARLY STAGES OF RECOVERY BOOK: Freq: Once | Status: DC

## 2023-01-14 MED ORDER — SODIUM CHLORIDE 0.9 % IV SOLN
500.0000 mg | Freq: Once | INTRAVENOUS | Status: AC
  Administered 2023-01-14: 500 mg via INTRAVENOUS
  Filled 2023-01-14: qty 5

## 2023-01-14 MED ORDER — METOPROLOL SUCCINATE ER 25 MG PO TB24
25.0000 mg | ORAL_TABLET | Freq: Every day | ORAL | Status: DC
  Administered 2023-01-14 – 2023-01-29 (×15): 25 mg via ORAL
  Filled 2023-01-14 (×16): qty 1

## 2023-01-14 MED ORDER — SODIUM CHLORIDE 0.9 % IV SOLN: INTRAVENOUS | Status: DC

## 2023-01-14 MED ORDER — ALBUTEROL SULFATE HFA 108 (90 BASE) MCG/ACT IN AERS: 2.0000 | INHALATION_SPRAY | RESPIRATORY_TRACT | Status: DC | PRN

## 2023-01-14 MED ORDER — ROSUVASTATIN CALCIUM 20 MG PO TABS
20.0000 mg | ORAL_TABLET | Freq: Every day | ORAL | Status: DC
  Administered 2023-01-15 – 2023-01-29 (×15): 20 mg via ORAL
  Filled 2023-01-14 (×15): qty 1

## 2023-01-14 MED ORDER — ACETAMINOPHEN 650 MG RE SUPP: 650.0000 mg | RECTAL | Status: DC | PRN

## 2023-01-14 MED ORDER — SODIUM CHLORIDE 0.9 % IV SOLN
1.0000 g | Freq: Once | INTRAVENOUS | Status: AC
  Administered 2023-01-14: 1 g via INTRAVENOUS
  Filled 2023-01-14: qty 10

## 2023-01-14 MED ORDER — TIOTROPIUM BROMIDE MONOHYDRATE 18 MCG IN CAPS
18.0000 ug | ORAL_CAPSULE | Freq: Every day | RESPIRATORY_TRACT | Status: DC
  Filled 2023-01-14: qty 5

## 2023-01-14 MED ORDER — ACETAMINOPHEN 325 MG PO TABS
650.0000 mg | ORAL_TABLET | ORAL | Status: DC | PRN
  Administered 2023-01-16 – 2023-01-28 (×13): 650 mg via ORAL
  Filled 2023-01-14 (×13): qty 2

## 2023-01-14 MED ORDER — ONDANSETRON HCL 4 MG/2ML IJ SOLN
4.0000 mg | Freq: Four times a day (QID) | INTRAMUSCULAR | Status: DC | PRN
  Administered 2023-01-19: 4 mg via INTRAVENOUS
  Filled 2023-01-14 (×2): qty 2

## 2023-01-14 NOTE — ED Notes (Signed)
Pt cleaned. NAD noted, respirations are equal bilaterally and unlabored at this time. Pt resting in gurney and denies any unmet needs. Pt connected to CCM, pulseox & BP. Call light within reach. 

## 2023-01-14 NOTE — ED Notes (Signed)
Bed bug found on patient and was caught in urine cup. Pt stripped, cleaned and placed in gown. Pt's clothing triple bagged.

## 2023-01-14 NOTE — ED Notes (Signed)
Transfer of care report given to IP RN, she is ready for patient.

## 2023-01-14 NOTE — H&P (Signed)
History and Physical    Patient: Reginald Ayers ZOX:[id number] DOB: 1951/05/21 DOA: 01/14/2023 DOS: the patient was seen and examined on 01/14/2023 PCP: Loura Back, NP  Patient coming from: Home; NOK: Niece, Ernest Haber, Niece, [phone]; Caregiver, Prentiss Bells, [phone]   Chief Complaint: weakness  HPI: Reginald Ayers is a 72 y.o. male with medical history significant of COPD, CAD s/p CABG, DM,  HTN, HLD, PAD s/p stent, and tobacco dependence presenting with RLE pain/weakness.  The patient is minimally able to provide history at this time.  By chart notes, he came in the decreased weakness and pain in his RLE with limited ability to move leading to fecal incontinence.  Per report, on arrival he was covered in fecal matter and bedbugs.  He was noted to have slurred speech last night.      ER Course:  ?CVA.  Caregiver says "not himself" x 2 days, defecating on himself, RLE weakness, slurred speech since last night.  Disheveled and bed bugs.  Non-focal exam.  MRI brain pending.  Petechial rash - ?bed bugs.  Platelets ok.  Acidosis, HCO3 13, gap 17, Na++ 128.  Looks dry, giving IVF.  Abd CT with small R pleural effusion, CBD dilation but normal LFTs and no abdominal pain.  Needs obs for CVA, acidosis.     Review of Systems: unable to review all systems due to the inability of the patient to answer questions. Past Medical History:  Diagnosis Date   Carotid artery disease (HCC)    L-ICA 100%, mod R-ICA dz   COPD (chronic obstructive pulmonary disease) (HCC)    Coronary artery disease    s/p CABG February 2010 by Dr. Andrey Spearman   Diabetes [facility])    Hyperlipidemia    Hypertension    Peripheral arterial disease (HCC)    post left common iliac and right SFA stenting remotely   Tobacco abuse        Past Surgical History:  Procedure Laterality Date   ABDOMINAL AORTOGRAM N/A 04/20/2017   Procedure: ABDOMINAL AORTOGRAM;  Surgeon: Nada Libman, MD;  Location: [facility]  INVASIVE CV LAB;  Service: Cardiovascular;  Laterality: N/A;   ABDOMINAL AORTOGRAM W/LOWER EXTREMITY Right 12/08/2016   Procedure: Abdominal Aortogram w/Lower Extremity;  Surgeon: Nada Libman, MD;  Location: [facility] INVASIVE CV LAB;  Service: Cardiovascular;  Laterality: Right;   ABDOMINAL AORTOGRAM W/LOWER EXTREMITY N/A 09/07/2017   Procedure: ABDOMINAL AORTOGRAM W/LOWER EXTREMITY;  Surgeon: Nada Libman, MD;  Location: [facility] INVASIVE CV LAB;  Service: Cardiovascular;  Laterality: N/A;   ABDOMINAL AORTOGRAM W/LOWER EXTREMITY N/A 03/31/2022   Procedure: ABDOMINAL AORTOGRAM W/LOWER EXTREMITY;  Surgeon: Nada Libman, MD;  Location: [facility] INVASIVE CV LAB;  Service: Cardiovascular;  Laterality: N/A;   ABDOMINAL AORTOGRAM W/LOWER EXTREMITY Right 09/29/2022   Procedure: ABDOMINAL AORTOGRAM W/LOWER EXTREMITY;  Surgeon: Nada Libman, MD;  Location: [facility] INVAS[facility] CV [facility];  Service: Cardiovascular;  Laterality: Right;   CARDIAC CATHETERIZATION  2010   CAROTID ANGIOGRAM  2014   CORONARY ARTERY BYPASS GRAFT  2010   LIMA-LAD, Lrad-OM1, SVG-RI, SVG-AM-dRCA   DOPPLER ECHOCARDIOGRAPHY  2010   LOWER EXTREMITY ANGIOGRAM  11/2006   left common femoral endarterectomy and patch angioplasty:The mid right SFA was angioplastied with a 4 x 8   LOWER EXTREMITY ANGIOGRAM  2003   left common iliac artery stenting by Dr. Erlene Quan   LOWER EXTREMITY ANGIOGRAPHY Right 04/20/2017   Procedure: Lower Extremity Angiography;  Surgeon: Nada Libman, MD;  Location: [facility]  INVASIVE CV LAB;  Service: Cardiovascular;  Laterality: Right;   NM MYOVIEW LTD  2015   PERIPHERAL VASCULAR ATHERECTOMY Right 12/08/2016   Procedure: Peripheral Vascular Atherectomy;  Surgeon: Nada Libman, MD;  Location: [facility] INV[facility] CV [facility];  Service: Cardiovascular;  Laterality: Right;   PERIPHERAL VASCULAR BALLOON ANGIOPLASTY Right 12/08/2016   Procedure: Peripheral Vascular Balloon Angioplasty;  Surgeon: Nada Libman, MD;  Location: [facility] INVASIVE CV LAB;  Service:  Cardiovascular;  Laterality: Right;   PERIPHERAL VASCULAR BALLOON ANGIOPLASTY Right 04/20/2017   Procedure: PERIPHERAL VASCULAR BALLOON ANGIOPLASTY;  Surgeon: Nada Libman, MD;  Location: [facility] IN[facility];  Service: Cardiovascular;  Laterality: Right;  SFA   PERIPHERAL VASCULAR BALLOON ANGIOPLASTY  03/31/2022   Procedure: PERIPHERAL VASCULAR BALLOON ANGIOPLASTY;  Surgeon: Nada Libman, MD;  Location: [facility] INVAS[facility] CV [facility];  Service: Cardiovascular;;  Right PT   PERIPHERAL VASCULAR BALLOON ANGIOPLASTY  09/29/2022   Procedure: PERIPHERAL VASCULAR BALLOON ANGIOPLASTY;  Surgeon: Nada Libman, MD;  Location: [facility] INV[facility] CV [facility];  Service: Cardiovascular;;  Rt PT and Peroneal   PERIPHERAL VASCULAR CATHETERIZATION N/A 09/22/2016   Procedure: Abdominal Aortogram w/ bilateral Lower Extremity Runoff;  Surgeon: Nada Libman, MD;  Location: [facility] INV[facility];  Service: Cardiovascular;  Laterality: N/A;   PERIPHERAL VASCULAR CATHETERIZATION Right 09/22/2016   Procedure: Peripheral Vascular Intervention;  Surgeon: Nada Libman, MD;  Location: [facility] IN[facility];  Service: Cardiovascular;  Laterality: Right;   PERIPHERAL VASCULAR INTERVENTION  09/29/2022   Procedure: PERIPHERAL VASCULAR INTERVENTION;  Surgeon: Nada Libman, MD;  Location: [facility] IN[facility];  Service: Cardiovascular;;  Rt Popliteal   TRANSCAROTID ARTERY REVASCULARIZATION  Right 12/26/2021   Procedure: Right Transcarotid Artery Revascularization;  Surgeon: Nada Libman, MD;  Location: [facility] OR;  Service: Vascular;  Laterality: Right;   ULTRASOUND GUIDANCE FOR VASCULAR ACCESS Left 12/26/2021   Procedure: ULTRASOUND GUIDANCE FOR VASCULAR ACCESS, LEFT FEMORAL VEIN;  Surgeon: Nada Libman, MD;  Location: [facility] OR;  Service: Vascular;  Laterality: Left;   Social History:  reports that he quit smoking about 6 years ago. His smoking use included cigarettes. His smokeless tobacco use includes chew. He reports that he does not drink alcohol and  does not use drugs.  Allergies  Allergen Reactions   Actos [Pioglitazone] Shortness Of Breath    Leg swelling    Lipitor [Atorvastatin] Other (See Comments)    Leg pain    Zestril [Lisinopril] Cough    Family History  Problem Relation Age of Onset   Heart attack Mother    CVA Mother    Heart disease Mother    Diabetes Sister    Hypertension Sister     Prior to Admission medications   Medication Sig Start Date End Date Taking? Authorizing Provider  acetaminophen (TYLENOL) 650 MG CR tablet Take 1,300 mg by mouth every 8 (eight) hours as needed for pain.    [provider]  albuterol (VENTOLIN HFA) 108 (90 Base) MCG/ACT inhaler Inhale 2 puffs into the lungs every 4 (four) hours as needed for shortness of breath or wheezing. 01/18/20   [provider]  apixaban (ELIQUIS) 2.5 MG TABS tablet Take 1 tablet (2.5 mg total) by mouth 2 (two) times daily. 06/25/22   Runell Gess, MD  aspirin 81 MG EC tablet Take 1 tablet (81 mg total) by mouth daily. Swallow whole. 12/31/21   Leroy Sea, MD  Blood Glucose Monitoring Suppl (BLOOD GLUCOSE MONITOR SYSTEM) w/Device KIT Used  to test blood sugars 4 times daily 12/31/21   Leroy Sea, MD  clopidogrel (PLAVIX) 75 MG tablet Take 1 tablet (75 mg total) by mouth daily. 09/29/22   Nada Libman, MD  Cyanocobalamin (VITAMIN B-12 PO) Take 1 tablet by mouth daily.    [provider]  ferrous sulfate 325 (65 FE) MG tablet Take 325 mg by mouth daily with breakfast.    [provider]  Insulin Pen Needle 32G X 4 MM MISC Use to inject insulin 4 times daily. 12/31/21   Leroy Sea, MD  metFORMIN (GLUCOPHAGE) 500 MG tablet Take 1,000 mg by mouth 2 (two) times daily with a meal. 07/24/13   [provider]  metoprolol succinate (TOPROL-XL) 25 MG 24 hr tablet TAKE 1 TABLET(25 MG) BY MOUTH DAILY 07/29/20   Runell Gess, MD  Multiple Vitamins-Minerals (CENTRUM SILVER 50+MEN) TABS Take 1 tablet by mouth  daily.    [provider]  NITROSTAT 0.4 MG SL tablet Take 0.4 mg by mouth every 5 (five) minutes as needed for chest pain. 12/18/14   [provider]  omeprazole (PRILOSEC) 20 MG capsule Take 20 mg by mouth daily.    [provider]  oxymetazoline (AFRIN) 0.05 % nasal spray Place 1 spray into both nostrils 2 (two) times daily as needed for congestion. Mucinex brand    [provider]  potassium chloride (K-DUR) 10 MEQ tablet Take 10 mEq by mouth daily with breakfast.    [provider]  rosuvastatin (CRESTOR) 20 MG tablet Take 1 tablet (20 mg total) by mouth daily. 12/31/21   Leroy Sea, MD  SPIRIVA HANDIHALER 18 MCG inhalation capsule Place 18 mcg into inhaler and inhale at bedtime. 08/16/13   [provider]  traMADol (ULTRAM) 50 MG tablet Take 50 mg by mouth 3 (three) times daily as needed for pain. 03/06/22   [provider]    Physical Exam: Vitals:   01/14/23 1410 01/14/23 1553 01/14/23 1615 01/14/23 1705  BP:  (!) 182/79 (!) 193/85 (!) 189/86  Pulse:  86    Resp:  (!) 22 (!) 21 18  Temp: 98.6 F (37 C) 98 F (36.7 C)    TempSrc: Oral Axillary    SpO2:  100%    Weight:      Height:       General:  Appears calm and comfortable and is in NAD, difficult speech to understand and mostly nonverbal but not unpleasant, frail, cachectic, pale Eyes:  PERRL, EOMI, normal lids, iris ENT:  hard of hhearing, dry lips & tongue, moderately dry mm; edentulous Neck:  no LAD, masses or thyromegaly Cardiovascular:  RRR, no m/r/g. No LE edema.  Respiratory:   CTA bilaterally with no wheezes/rales/rhonchi.  Normal respiratory effort. Abdomen:  soft, NT, ND Skin:  fine erythematous papules scattered on arms and legs; lichenification of B pedal skin, feet are pale and cool to touch Musculoskeletal:  poor tone BUE/BLE, no bony abnormality Psychiatric:  pleasant mood and affect, speech dysarthric and limited Neurologic:  CN 2-12  grossly intact, generalized weakness without frank focal deficits   Radiological Exams on Admission: Independently reviewed - see discussion in A/P where applicable  CT ABDOMEN PELVIS WO CONTRAST  Result Date: 01/14/2023 CLINICAL DATA:  Lactic acidosis, back pain EXAM: CT ABDOMEN AND PELVIS WITHOUT CONTRAST TECHNIQUE: Multidetector CT imaging of the abdomen and pelvis was performed following the standard protocol without IV contrast. RADIATION DOSE REDUCTION: This exam was performed according to  the departmental dose-optimization program which includes automated exposure control, adjustment of the mA and/or kV according to patient size and/or use of iterative reconstruction technique. COMPARISON:  Abdomen radiograph 01/14/2023, CT 12/20/2021 FINDINGS: Lower chest: Lung bases demonstrate small right-sided pleural effusion. Mild ground-glass density in the right lower lobe. Emphysema. Coronary vascular calcification Hepatobiliary: Distended gallbladder without calcified stone. No focal hepatic abnormality. Common bile duct is dilated, measuring up to 19 mm. No calcified stones are visualized. Suspicion of mild intra hepatic biliary dilatation. Pancreas: No inflammation.  Atrophic. Spleen: Normal in size without focal abnormality. Adrenals/Urinary Tract: Adrenal glands are within normal limits. Kidneys show no hydronephrosis. Punctate nonobstructing right kidney stones. The bladder is distended Stomach/Bowel: Stomach nonenlarged. No dilated small bowel. Possible mild wall thickening at the hepatic flexure. Nonvisualized appendix. Vascular/Lymphatic: Advanced aortic atherosclerosis. No aneurysm. No suspicious lymph nodes. Reproductive: Prostate is unremarkable. Other: Negative for pelvic effusion or free air. Musculoskeletal: No acute or suspicious osseous abnormality. IMPRESSION: 1. Small right-sided pleural effusion. Mild ground-glass density in the right lower lobe could be due to mild pneumonia. 2. Distended  gallbladder without calcified stone. Dilated common bile duct measuring up to 19 mm with suspicion of intra hepatic biliary dilatation. Suggest correlation with LFTs and follow-up MRCP as indicated. 3. Possible mild wall thickening at the hepatic flexure of the colon, correlate for any history of diarrhea/colitis type symptoms 4. Punctate nonobstructing right kidney stones. 5. Aortic atherosclerosis. Aortic Atherosclerosis (ICD10-I70.0). Electronically Signed   By: Jasmine Pang M.D.   On: 01/14/2023 15:36   DG Abd Portable 1V  Result Date: 01/14/2023 CLINICAL DATA:  [id number] Pain [id number] EXAM: PORTABLE ABDOMEN - 1 VIEW COMPARISON:  CT 12/20/2021 FINDINGS: Effusion and airspace disease at the right lung base. Stomach and small bowel are nondilated. Moderate colonic fecal material without dilatation. Regional bones unremarkable. Left common iliac and right femoral vascular stents. IMPRESSION: Nonobstructive bowel gas pattern with moderate colonic fecal material. Electronically Signed   By: Corlis Leak M.D.   On: 01/14/2023 14:34   DG Chest Port 1 View  Result Date: 01/14/2023 CLINICAL DATA:  Pain. EXAM: PORTABLE CHEST 1 VIEW COMPARISON:  03/05/2022. FINDINGS: Rotated patient. No consolidation or pulmonary edema. Chronic scarring in the right lung base. Normal heart size. Stable mediastinal contours with postoperative changes of median sternotomy and CABG. No pleural effusion or pneumothorax. Visualized bones and upper abdomen are unremarkable. IMPRESSION: No evidence of acute cardiopulmonary disease. Electronically Signed   By: Orvan Falconer M.D.   On: 01/14/2023 12:18   CT HEAD WO CONTRAST  Result Date: 01/14/2023 CLINICAL DATA:  Neuro deficit. Stroke suspected. Weakness and confusion. EXAM: CT HEAD WITHOUT CONTRAST TECHNIQUE: Contiguous axial images were obtained from the base of the skull through the vertex without intravenous contrast. RADIATION DOSE REDUCTION: This exam was performed according to the  departmental dose-optimization program which includes automated exposure control, adjustment of the mA and/or kV according to patient size and/or use of iterative reconstruction technique. COMPARISON:  12/26/2021 FINDINGS: Brain: No evidence of acute infarction, hemorrhage, hydrocephalus, extra-axial collection or mass lesion/mass effect. There is mild low-attenuation within the subcortical and periventricular white matter compatible with chronic microvascular disease. Prominence of the sulci and ventricles compatible with brain atrophy. Vascular: No hyperdense vessel or unexpected calcification. Skull: Normal. Negative for fracture or focal lesion. Sinuses/Orbits: Postoperative changes involving the paranasal sinuses. Mucosal thickening is noted involving bilateral maxillary sinuses. No sinus fluid levels. Other: None IMPRESSION: 1. No acute intracranial abnormalities. 2. Chronic microvascular disease and  brain atrophy. Electronically Signed   By: Signa Kell M.D.   On: 01/14/2023 12:10    EKG: Independently reviewed.  NSR with rate 79; nonspecific ST changes with no evidence of acute ischemia   Labs on Admission: I have personally reviewed the available labs and imaging studies at the time of the admission.  Pertinent labs:    VBG: 7.339/29.4/15.8 Na++ 128 CO2 13 Glucose 192 BUN 40/Creatinine 1.29/GFR 58 - stable Anion gap 17 HS troponin 16, 16 WBC 6.5 Hgb 9.4 INR 1.2   Assessment and Plan: Principal Problem:   Increased anion gap metabolic acidosis Active Problems:   Hx of CABG Feb 2010   Peripheral arterial disease (HCC)   Diabetes (HCC)   Essential hypertension   COPD (chronic obstructive pulmonary disease) (HCC)   Stage 3a chronic kidney disease (HCC)   Right sided weakness   DNR (do not resuscitate)    Anion gap metabolic acidosis -Suspect that this is related to starvation ketosis -Does not appear to be in DKA, only mildly elevated glucose -Will aggressively hydrate  and follow -Nutrition consult  R-sided weakness -Concern for CVA based on history -MRI pending of brain and lumbar spine -PT/OT/ST consults -TOC team consult for probable FTT - may need placement -Patient was d/w Dr. Christia Reading at the time of admission, no Code Stroke called -Will need neurology consult if MRI is abnormal  CAD -s/p CABG -Continue ASA, Plavix, Eliquis  PAD -Underwent aortogram with RLE runoff with stenting of R popliteal artery and balloon angioplasty on 09/29/22 -Has lichenification of skin on feet and cool extremities but nursing staff was able to get pulses by Doppler -He is scheduled for repeat carotids in 08/2023 and for repeat RLE arterial duplex and ABIs in 02/2023 -Continue ASA, Plavix, Eliquis  Dilated gallbladder/CBD -Patient without report of GI symptoms or abdominal TTP -Normal LFTs -Will hold further evaluation at this time  HTN -Continue Toprol XL -Will add prn IV hydralazine  DM -Will check A1c -hold Glucophage -Cover with moderate-scale SSI   COPD -Continue Spiriva, albuterol  Stage 3a CKD -Appears to be stable at this time -Attempt to avoid nephrotoxic medications -Recheck BMP in AM   Goals of care -Saw Dr. Celine Mans in 05/2022 (more recently than the MOST form in the computer from 2021) -Her note is clear that he "would be ok with IV abx, oxygen therapy.  Avoid ICU.  Would not want CPR or mechanical ventilation" -Updated MOST form was found in Media with information as previously reported -Will change code status accordingly  Bed bugs -Rash on arms and legs appears to be c/w bed bugs, which were appreciated on the patient at the time of admission -Contact precautions ordered    Advance Care Planning:   Code Status: DNR   Consults: Neurology (telephone only?); nutrition; TOC team; PT/OT/ST  DVT Prophylaxis: Eliquis  Family Communication: None present  Severity of Illness: The appropriate patient status for this patient is  OBSERVATION. Observation status is judged to be reasonable and necessary in order to provide the required intensity of service to ensure the patient's safety. The patient's presenting symptoms, physical exam findings, and initial radiographic and laboratory data in the context of their medical condition is felt to place them at decreased risk for further clinical deterioration. Furthermore, it is anticipated that the patient will be medically stable for discharge from the hospital within 2 midnights of admission.   Author: Jonah Blue, MD 01/14/2023 5:32 PM  For on call review [email or web address].

## 2023-01-14 NOTE — ED Triage Notes (Signed)
PT BIB EMS from home for increasing weakness and pain in right leg which limits pt's ability to move, defecating on self for 2 days. Pt presents with pinpoint red rashes all over arms and legs. Pt is A&Ox self only. Pedal pulses found on doppler.   EMS VS 90 NRS BG 208 - metformin 200/90 12 RR End tidal 20

## 2023-01-14 NOTE — ED Notes (Signed)
Pt cleaned. NAD noted, respirations are equal bilaterally and unlabored at this time. Pt resting in gurney and denies any unmet needs. Pt connected to CCM, pulseox & BP. Call light within reach.

## 2023-01-14 NOTE — ED Notes (Addendum)
ED TO INPATIENT HANDOFF REPORT  ED Nurse Name and Phone #: Osvaldo Shipper RN [phone]  S Name/Age/Gender Reginald Ayers 72 y.o. male Room/Bed: 006C/006C  Code Status   Code Status: Prior  Home/SNF/Other Home Patient oriented to: self Is this baseline? Yes   Triage Complete: Triage complete  Chief Complaint Increased anion gap metabolic acidosis [E87.29]  Triage Note PT BIB EMS from home for increasing weakness and pain in right leg which limits pt's ability to move, defecating on self for 2 days. Pt presents with pinpoint red rashes all over arms and legs. Pt is A&Ox self only. Pedal pulses found on doppler.   EMS VS 90 NRS BG 208 - metformin 200/90 12 RR End tidal 20   Allergies Allergies  Allergen Reactions   Actos [Pioglitazone] Shortness Of Breath    Leg swelling    Lipitor [Atorvastatin] Other (See Comments)    Leg pain    Zestril [Lisinopril] Cough    Level of Care/Admitting Diagnosis ED Disposition     ED Disposition  Admit   Condition  --   Comment  Hospital Area: [facility] [[id number]]  Level of Care: Progressive [102]  Admit to Progressive based on following criteria: MULTISYSTEM THREATS such as stable sepsis, metabolic/electrolyte imbalance with or without encephalopathy that is responding to early treatment.  May place patient in observation at [facility] or [facility] if equivalent level of care is available:: No  Covid Evaluation: Asymptomatic - no recent exposure (last 10 days) testing not required  Diagnosis: Increased anion gap metabolic acidosis [454098]  Admitting Physician: Jonah Blue [[id number]]  Attending Physician: Jonah Blue [[id number]]          B Medical/Surgery History Past Medical History:  Diagnosis Date   Carotid artery disease (HCC)    L-ICA 100%, mod R-ICA dz   COPD (chronic obstructive pulmonary disease) (HCC)    Coronary artery disease    s/p CABG February 2010 by Dr. Andrey Spearman   Diabetes  [facility])    Hyperlipidemia    Hypertension    Peripheral arterial disease (HCC)    post left common iliac and right SFA stenting remotely   Tobacco abuse        Past Surgical History:  Procedure Laterality Date   ABDOMINAL AORTOGRAM N/A 04/20/2017   Procedure: ABDOMINAL AORTOGRAM;  Surgeon: Nada Libman, MD;  [facility];  Service: Cardiovascular;  Laterality: N/A;   ABDOMINAL AORTOGRAM W/LOWER EXTREMITY Right 12/08/2016   Procedure: Abdominal Aortogram w/Lower Extremity;  Surgeon: Nada Libman, MD;  Location: [facility];  Service: Cardiovascular;  Laterality: Right;   ABDOMINAL AORTOGRAM W/LOWER EXTREMITY N/A 09/07/2017   Procedure: ABDOMINAL AORTOGRAM W/LOWER EXTREMITY;  Surgeon: Nada Libman, MD;  [facility];  Service: Cardiovascular;  Laterality: N/A;   ABDOMINAL AORTOGRAM W/LOWER EXTREMITY N/A 03/31/2022   Procedure: ABDOMINAL AORTOGRAM W/LOWER EXTREMITY;  Surgeon: Nada Libman, MD;  [facility]: [facility];  Service: Cardiovascular;  Laterality: N/A;   ABDOMINAL AORTOGRAM W/LOWER EXTREMITY Right 09/29/2022   Procedure: ABDOMINAL AORTOGRAM W/LOWER EXTREMITY;  Surgeon: Nada Libman, MD;  [facility];  Service: Cardiovascular;  Laterality: Right;   CARDIAC CATHETERIZATION  2010   CAROTID ANGIOGRAM  2014   CORONARY ARTERY BYPASS GRAFT  2010   LIMA-LAD, Lrad-OM1, SVG-RI, SVG-AM-dRCA   DOPPLER ECHOCARDIOGRAPHY  2010   LOWER EXTREMITY ANGIOGRAM  11/2006   left common femoral endarterectomy and patch angioplasty:The mid right  SFA was angioplastied with a 4 x 8   LOWER EXTREMITY ANGIOGRAM  2003   left common iliac artery stenting by Dr. Erlene Quan   LOWER EXTREMITY ANGIOGRAPHY Right 04/20/2017   Procedure: Lower Extremity Angiography;  Surgeon: Nada Libman, MD;  Location: [facility] INVASIVE CV LAB;  Service: Cardiovascular;  Laterality: Right;   NM MYOVIEW LTD  2015   PERIPHERAL VASCULAR ATHERECTOMY Right 12/08/2016    Procedure: Peripheral Vascular Atherectomy;  Surgeon: Nada Libman, MD;  [facility];  Service: Cardiovascular;  Laterality: Right;   PERIPHERAL VASCULAR BALLOON ANGIOPLASTY Right 12/08/2016   Procedure: Peripheral Vascular Balloon Angioplasty;  Surgeon: Nada Libman, MD;  [facility];  Service: Cardiovascular;  Laterality: Right;   PERIPHERAL VASCULAR BALLOON ANGIOPLASTY Right 04/20/2017   Procedure: PERIPHERAL VASCULAR BALLOON ANGIOPLASTY;  Surgeon: Nada Libman, MD;  [facility];  Service: Cardiovascular;  Laterality: Right;  SFA   PERIPHERAL VASCULAR BALLOON ANGIOPLASTY  03/31/2022   Procedure: PERIPHERAL VASCULAR BALLOON ANGIOPLASTY;  Surgeon: Nada Libman, MD;  [facility];  Service: Cardiovascular;;  Right PT   PERIPHERAL VASCULAR BALLOON ANGIOPLASTY  09/29/2022   Procedure: PERIPHERAL VASCULAR BALLOON ANGIOPLASTY;  Surgeon: Nada Libman, MD;  [facility];  Service: Cardiovascular;;  Rt PT and Peroneal   PERIPHERAL VASCULAR CATHETERIZATION N/A 09/22/2016   Procedure: Abdominal Aortogram w/ bilateral Lower Extremity Runoff;  Surgeon: Nada Libman, MD;  [facility];  Service: Cardiovascular;  Laterality: N/A;   PERIPHERAL VASCULAR CATHETERIZATION Right 09/22/2016   Procedure: Peripheral Vascular Intervention;  Surgeon: Nada Libman, MD;  [facility];  Service: Cardiovascular;  Laterality: Right;   PERIPHERAL VASCULAR INTERVENTION  09/29/2022   Procedure: PERIPHERAL VASCULAR INTERVENTION;  Surgeon: Nada Libman, MD;  [facility];  Service: Cardiovascular;;  Rt Popliteal   TRANSCAROTID ARTERY REVASCULARIZATION  Right 12/26/2021   Procedure: Right Transcarotid Artery Revascularization;  Surgeon: Nada Libman, MD;  Location: [facility] OR;  Service: Vascular;  Laterality: Right;   ULTRASOUND GUIDANCE FOR VASCULAR ACCESS Left 12/26/2021   Procedure:  ULTRASOUND GUIDANCE FOR VASCULAR ACCESS, LEFT FEMORAL VEIN;  Surgeon: Nada Libman, MD;  Location: [facility];  Service: Vascular;  Laterality: Left;     A IV Location/Drains/Wounds Patient Lines/Drains/Airways Status     Active Line/Drains/Airways     Name Placement date Placement time Site Days   Peripheral IV 01/14/23 18 G Right Antecubital 01/14/23  1029  Antecubital  less than 1   Peripheral IV 01/14/23 20 G Posterior;Right Forearm 01/14/23  1610  Forearm  less than 1   External Urinary Catheter 01/10/22  1736  --  369   Pressure Injury 12/20/21 Buttocks Right;Left Stage 1 -  Intact skin with non-blanchable redness of a localized area usually over a bony prominence. wound is 0.5 cm lenght  by 0.6 cm width 12/20/21  1831  -- 390   Wound / Incision (Open or Dehisced) 08/12/17 Venous stasis ulcer Leg Right 08/12/17  2306  Leg  1981   Wound / Incision (Open or Dehisced) 08/13/17 Non-pressure wound Buttocks Posterior;Right partial thickness 1 1/2 cm wound, whitish in color, pt states it has been there over a year and he has been treating it 08/13/17  0802  Buttocks  1980            Intake/Output Last 24 hours  Intake/Output Summary (Last 24 hours) at  01/14/2023 1647 Last data filed at 01/14/2023 1547 Gross per 24 hour  Intake 1000 ml  Output --  Net 1000 ml    Labs/Imaging Results for orders placed or performed during the hospital encounter of 01/14/23 (from the past 48 hour(s))  Urine rapid drug screen (hosp performed)     Status: None   Collection Time: 01/14/23 10:59 AM  Result Value Ref Range   Opiates NONE DETECTED NONE DETECTED   Cocaine NONE DETECTED NONE DETECTED   Benzodiazepines NONE DETECTED NONE DETECTED   Amphetamines NONE DETECTED NONE DETECTED   Tetrahydrocannabinol NONE DETECTED NONE DETECTED   Barbiturates NONE DETECTED NONE DETECTED    Comment: (NOTE) DRUG SCREEN FOR MEDICAL PURPOSES ONLY.  IF CONFIRMATION IS NEEDED FOR ANY PURPOSE, NOTIFY LAB WITHIN 5  DAYS.  LOWEST DETECTABLE LIMITS FOR URINE DRUG SCREEN Drug Class                     Cutoff (ng/mL) Amphetamine and metabolites    1000 Barbiturate and metabolites    200 Benzodiazepine                 200 Opiates and metabolites        300 Cocaine and metabolites        300 THC                            50 Performed at [facility] Lab, [address], [place], [place] [address]   Urinalysis, Routine w reflex microscopic -Urine, Clean Catch     Status: Abnormal   Collection Time: 01/14/23 10:59 AM  Result Value Ref Range   Color, Urine YELLOW YELLOW   APPearance CLEAR CLEAR   Specific Gravity, Urine 1.017 1.005 - 1.030   pH 5.0 5.0 - 8.0   Glucose, UA NEGATIVE NEGATIVE mg/dL   Hgb urine dipstick SMALL (A) NEGATIVE   Bilirubin Urine NEGATIVE NEGATIVE   Ketones, ur 5 (A) NEGATIVE mg/dL   Protein, ur 604 (A) NEGATIVE mg/dL   Nitrite NEGATIVE NEGATIVE   Leukocytes,Ua NEGATIVE NEGATIVE   RBC / HPF 0-5 0 - 5 RBC/hpf   WBC, UA 0-5 0 - 5 WBC/hpf   Bacteria, UA NONE SEEN NONE SEEN   Squamous Epithelial / HPF 0-5 0 - 5 /HPF    Comment: Performed at [facility] Lab, [address], [place], [place] [address]  Ethanol     Status: None   Collection Time: 01/14/23 11:20 AM  Result Value Ref Range   Alcohol, Ethyl (B) <10 <10 mg/dL    Comment: (NOTE) Lowest detectable limit for serum alcohol is 10 mg/dL.  For medical purposes only. Performed at [facility] Lab, [address], [place], [place] [address]   Protime-INR     Status: Abnormal   Collection Time: 01/14/23 11:20 AM  Result Value Ref Range   Prothrombin Time 15.4 (H) 11.4 - 15.2 seconds   INR 1.2 0.8 - 1.2    Comment: (NOTE) INR goal varies based on device and disease states. Performed at [facility] Lab, [address], [place], [place] [address]   APTT     Status: None   Collection Time: 01/14/23 11:20 AM  Result Value Ref Range   aPTT 31 24 - 36 seconds    Comment: Performed at [facility]  Lab, [address] [place], [place] [address]  CBC     Status:  Abnormal   Collection Time: 01/14/23 11:20 AM  Result Value Ref Range   WBC 6.5 4.0 - 10.5 K/uL   RBC 3.33 (L) 4.22 - 5.81 MIL/uL   Hemoglobin 9.4 (L) 13.0 - 17.0 g/dL   HCT 16.1 (L) 09.6 - 04.5 %   MCV 91.0 80.0 - 100.0 fL   MCH 28.2 26.0 - 34.0 pg   MCHC 31.0 30.0 - 36.0 g/dL   RDW 40.9 81.1 - 91.4 %   Platelets 271 150 - 400 K/uL   nRBC 0.0 0.0 - 0.2 %    Comment: Performed at [facility] Lab, [address], [place], [place] [address]  Differential     Status: Abnormal   Collection Time: 01/14/23 11:20 AM  Result Value Ref Range   Neutrophils Relative % 81 %   Neutro Abs 5.3 1.7 - 7.7 K/uL   Lymphocytes Relative 7 %   Lymphs Abs 0.5 (L) 0.7 - 4.0 K/uL   Monocytes Relative 10 %   Monocytes Absolute 0.6 0.1 - 1.0 K/uL   Eosinophils Relative 1 %   Eosinophils Absolute 0.0 0.0 - 0.5 K/uL   Basophils Relative 0 %   Basophils Absolute 0.0 0.0 - 0.1 K/uL   Immature Granulocytes 1 %   Abs Immature Granulocytes 0.08 (H) 0.00 - 0.07 K/uL    Comment: Performed at [facility] Lab, [address], [place], [place] [address]  Comprehensive metabolic panel     Status: Abnormal   Collection Time: 01/14/23 11:20 AM  Result Value Ref Range   Sodium 128 (L) 135 - 145 mmol/L   Potassium 4.7 3.5 - 5.1 mmol/L    Comment: HEMOLYSIS AT THIS LEVEL MAY AFFECT RESULT   Chloride 98 98 - 111 mmol/L   CO2 13 (L) 22 - 32 mmol/L   Glucose, Bld 192 (H) 70 - 99 mg/dL    Comment: Glucose reference range applies only to samples taken after fasting for at least 8 hours.   BUN 40 (H) 8 - 23 mg/dL   Creatinine, Ser 8.65 (H) 0.61 - 1.24 mg/dL   Calcium 9.3 8.9 - 78.4 mg/dL   Total Protein 6.7 6.5 - 8.1 g/dL   Albumin 3.6 3.5 - 5.0 g/dL   AST 37 15 - 41 U/L    Comment: HEMOLYSIS AT THIS LEVEL MAY AFFECT RESULT   ALT 17 0 - 44 U/L    Comment: HEMOLYSIS AT THIS LEVEL MAY AFFECT RESULT   Alkaline Phosphatase 64 38 - 126 U/L   Total  Bilirubin 0.9 0.3 - 1.2 mg/dL    Comment: HEMOLYSIS AT THIS LEVEL MAY AFFECT RESULT   GFR, Estimated 58 (L) >60 mL/min    Comment: (NOTE) Calculated using the CKD-EPI Creatinine Equation (2021)    Anion gap 17 (H) 5 - 15    Comment: Performed at [facility] Lab, [address], [place], [place] [address]  Troponin I (High Sensitivity)     Status: None   Collection Time: 01/14/23 11:20 AM  Result Value Ref Range   Troponin I (High Sensitivity) 16 <18 ng/L    Comment: (NOTE) Elevated high sensitivity troponin I (hsTnI) values and significant  changes across serial measurements may suggest ACS but many other  chronic and acute conditions are known to elevate hsTnI results.  Refer to the "Links" section for chest pain algorithms and additional  guidance. Performed at [facility] Lab, [address] [place], [place] [address]   I-stat chem 8, ED  Status: Abnormal   Collection Time: 01/14/23 11:29 AM  Result Value Ref Range   Sodium 128 (L) 135 - 145 mmol/L   Potassium 4.7 3.5 - 5.1 mmol/L   Chloride 105 98 - 111 mmol/L   BUN 47 (H) 8 - 23 mg/dL   Creatinine, Ser 1.61 0.61 - 1.24 mg/dL   Glucose, Bld 096 (H) 70 - 99 mg/dL    Comment: Glucose reference range applies only to samples taken after fasting for at least 8 hours.   Calcium, Ion 1.09 (L) 1.15 - 1.40 mmol/L   TCO2 15 (L) 22 - 32 mmol/L   Hemoglobin 10.5 (L) 13.0 - 17.0 g/dL   HCT 04.5 (L) 40.9 - 81.1 %  Troponin I (High Sensitivity)     Status: None   Collection Time: 01/14/23  1:44 PM  Result Value Ref Range   Troponin I (High Sensitivity) 16 <18 ng/L    Comment: (NOTE) Elevated high sensitivity troponin I (hsTnI) values and significant  changes across serial measurements may suggest ACS but many other  chronic and acute conditions are known to elevate hsTnI results.  Refer to the "Links" section for chest pain algorithms and additional  guidance. Performed at [facility] Lab, [address],  [place], [place] [address]   I-Stat venous blood gas, ED     Status: Abnormal   Collection Time: 01/14/23  2:12 PM  Result Value Ref Range   pH, Ven 7.339 7.25 - 7.43   pCO2, Ven 29.4 (L) 44 - 60 mmHg   pO2, Ven 48 (H) 32 - 45 mmHg   Bicarbonate 15.8 (L) 20.0 - 28.0 mmol/L   TCO2 17 (L) 22 - 32 mmol/L   O2 Saturation 82 %   Acid-base deficit 9.0 (H) 0.0 - 2.0 mmol/L   Sodium 129 (L) 135 - 145 mmol/L   Potassium 4.1 3.5 - 5.1 mmol/L   Calcium, Ion 1.18 1.15 - 1.40 mmol/L   HCT 30.0 (L) 39.0 - 52.0 %   Hemoglobin 10.2 (L) 13.0 - 17.0 g/dL   Sample type VENOUS    CT ABDOMEN PELVIS WO CONTRAST  Result Date: 01/14/2023 CLINICAL DATA:  Lactic acidosis, back pain EXAM: CT ABDOMEN AND PELVIS WITHOUT CONTRAST TECHNIQUE: Multidetector CT imaging of the abdomen and pelvis was performed following the standard protocol without IV contrast. RADIATION DOSE REDUCTION: This exam was performed according to the departmental dose-optimization program which includes automated exposure control, adjustment of the mA and/or kV according to patient size and/or use of iterative reconstruction technique. COMPARISON:  Abdomen radiograph 01/14/2023, CT 12/20/2021 FINDINGS: Lower chest: Lung bases demonstrate small right-sided pleural effusion. Mild ground-glass density in the right lower lobe. Emphysema. Coronary vascular calcification Hepatobiliary: Distended gallbladder without calcified stone. No focal hepatic abnormality. Common bile duct is dilated, measuring up to 19 mm. No calcified stones are visualized. Suspicion of mild intra hepatic biliary dilatation. Pancreas: No inflammation.  Atrophic. Spleen: Normal in size without focal abnormality. Adrenals/Urinary Tract: Adrenal glands are within normal limits. Kidneys show no hydronephrosis. Punctate nonobstructing right kidney stones. The bladder is distended Stomach/Bowel: Stomach nonenlarged. No dilated small bowel. Possible mild wall thickening at the hepatic flexure.  Nonvisualized appendix. Vascular/Lymphatic: Advanced aortic atherosclerosis. No aneurysm. No suspicious lymph nodes. Reproductive: Prostate is unremarkable. Other: Negative for pelvic effusion or free air. Musculoskeletal: No acute or suspicious osseous abnormality. IMPRESSION: 1. Small right-sided pleural effusion. Mild ground-glass density in the right lower lobe could be due to mild pneumonia. 2. Distended gallbladder without  calcified stone. Dilated common bile duct measuring up to 19 mm with suspicion of intra hepatic biliary dilatation. Suggest correlation with LFTs and follow-up MRCP as indicated. 3. Possible mild wall thickening at the hepatic flexure of the colon, correlate for any history of diarrhea/colitis type symptoms 4. Punctate nonobstructing right kidney stones. 5. Aortic atherosclerosis. Aortic Atherosclerosis (ICD10-I70.0). Electronically Signed   By: Jasmine Pang M.D.   On: 01/14/2023 15:36   DG Abd Portable 1V  Result Date: 01/14/2023 CLINICAL DATA:  [id number] Pain [id number] EXAM: PORTABLE ABDOMEN - 1 VIEW COMPARISON:  CT 12/20/2021 FINDINGS: Effusion and airspace disease at the right lung base. Stomach and small bowel are nondilated. Moderate colonic fecal material without dilatation. Regional bones unremarkable. Left common iliac and right femoral vascular stents. IMPRESSION: Nonobstructive bowel gas pattern with moderate colonic fecal material. Electronically Signed   By: Corlis Leak M.D.   On: 01/14/2023 14:34   DG Chest Port 1 View  Result Date: 01/14/2023 CLINICAL DATA:  Pain. EXAM: PORTABLE CHEST 1 VIEW COMPARISON:  03/05/2022. FINDINGS: Rotated patient. No consolidation or pulmonary edema. Chronic scarring in the right lung base. Normal heart size. Stable mediastinal contours with postoperative changes of median sternotomy and CABG. No pleural effusion or pneumothorax. Visualized bones and upper abdomen are unremarkable. IMPRESSION: No evidence of acute cardiopulmonary disease.  Electronically Signed   By: Orvan Falconer M.D.   On: 01/14/2023 12:18   CT HEAD WO CONTRAST  Result Date: 01/14/2023 CLINICAL DATA:  Neuro deficit. Stroke suspected. Weakness and confusion. EXAM: CT HEAD WITHOUT CONTRAST TECHNIQUE: Contiguous axial images were obtained from the base of the skull through the vertex without intravenous contrast. RADIATION DOSE REDUCTION: This exam was performed according to the departmental dose-optimization program which includes automated exposure control, adjustment of the mA and/or kV according to patient size and/or use of iterative reconstruction technique. COMPARISON:  12/26/2021 FINDINGS: Brain: No evidence of acute infarction, hemorrhage, hydrocephalus, extra-axial collection or mass lesion/mass effect. There is mild low-attenuation within the subcortical and periventricular white matter compatible with chronic microvascular disease. Prominence of the sulci and ventricles compatible with brain atrophy. Vascular: No hyperdense vessel or unexpected calcification. Skull: Normal. Negative for fracture or focal lesion. Sinuses/Orbits: Postoperative changes involving the paranasal sinuses. Mucosal thickening is noted involving bilateral maxillary sinuses. No sinus fluid levels. Other: None IMPRESSION: 1. No acute intracranial abnormalities. 2. Chronic microvascular disease and brain atrophy. Electronically Signed   By: Signa Kell M.D.   On: 01/14/2023 12:10    Pending Labs Unresulted Labs (From admission, onward)     Start     Ordered   01/14/23 1601  Lipase, blood  Once,   STAT        01/14/23 1600   01/14/23 1437  Lactic acid, plasma  Now then every 2 hours,   R      01/14/23 1436   01/14/23 1328  Blood gas, venous  Once,   R        01/14/23 1327            Vitals/Pain Today's Vitals   01/14/23 1055 01/14/23 1119 01/14/23 1410 01/14/23 1553  BP:    (!) 182/79  Pulse:    86  Resp:    (!) 22  Temp:  98.6 F (37 C) 98.6 F (37 C) 98 F (36.7 C)   TempSrc:  Axillary Oral Axillary  SpO2:    100%  Weight:      Height:      PainSc: 5  Isolation Precautions No active isolations  Medications Medications  lactated ringers bolus 1,000 mL (0 mLs Intravenous Stopped 01/14/23 1547)  lactated ringers bolus 1,000 mL (1,000 mLs Intravenous New Bag/Given 01/14/23 1630)    Mobility non-ambulatory     Focused Assessments    R Recommendations: See Admitting Provider Note  Report given to:   Additional Notes: Bed bugs found on patient, pt decontaminated and clothes triple bagged.

## 2023-01-14 NOTE — ED Provider Notes (Signed)
[place] [facility]ENC[facility]MENT AT [facility] Provider Note   CSN: [id number] Arrival date & time: 01/14/23  1042     History  No chief complaint on file.   Reginald Ayers is a 72 y.o. male.  Patient arrives by EMS with episode of slurred speech since last night as well as confusion His ADLs for the past 2 days.  Patient has a history of COPD, hypertension, diabetes, CAD, peripheral vascular disease with right stent.  Per his caregiver he has not been taking care of himself for the past 2 days, not making his meals as he normally does, defecating on himself, difficulty walking due to weakness in his right leg.  Caregiver reports slurred speech since last night.  Patient denies this and thinks it is because he does not have his dentures. EMS reports stroke screen was negative.  Patient denies that he has slurred speech now.  He has no focal weakness per EMS.  Denies any headache, chest pain, shortness of breath, nausea or vomiting, pain with urination or blood in the urine.  No known fever.  He denies any pain.   The history is provided by the patient and the EMS personnel.       Home Medications Prior to Admission medications   Medication Sig Start Date End Date Taking? Authorizing Provider  acetaminophen (TYLENOL) 650 MG CR tablet Take 1,300 mg by mouth every 8 (eight) hours as needed for pain.    [provider]  albuterol (VENTOLIN HFA) 108 (90 Base) MCG/ACT inhaler Inhale 2 puffs into the lungs every 4 (four) hours as needed for shortness of breath or wheezing. 01/18/20   [provider]  apixaban (ELIQUIS) 2.5 MG TABS tablet Take 1 tablet (2.5 mg total) by mouth 2 (two) times daily. 06/25/22   Runell Gess, MD  aspirin 81 MG EC tablet Take 1 tablet (81 mg total) by mouth daily. Swallow whole. 12/31/21   Leroy Sea, MD  Blood Glucose Monitoring Suppl (BLOOD GLUCOSE MONITOR SYSTEM) w/Device KIT Used to test blood sugars 4 times daily 12/31/21    Leroy Sea, MD  clopidogrel (PLAVIX) 75 MG tablet Take 1 tablet (75 mg total) by mouth daily. 09/29/22   Nada Libman, MD  Cyanocobalamin (VITAMIN B-12 PO) Take 1 tablet by mouth daily.    [provider]  ferrous sulfate 325 (65 FE) MG tablet Take 325 mg by mouth daily with breakfast.    [provider]  Insulin Pen Needle 32G X 4 MM MISC Use to inject insulin 4 times daily. 12/31/21   Leroy Sea, MD  metFORMIN (GLUCOPHAGE) 500 MG tablet Take 1,000 mg by mouth 2 (two) times daily with a meal. 07/24/13   [provider]  metoprolol succinate (TOPROL-XL) 25 MG 24 hr tablet TAKE 1 TABLET(25 MG) BY MOUTH DAILY 07/29/20   Runell Gess, MD  Multiple Vitamins-Minerals (CENTRUM SILVER 50+MEN) TABS Take 1 tablet by mouth daily.    [provider]  NITROSTAT 0.4 MG SL tablet Take 0.4 mg by mouth every 5 (five) minutes as needed for chest pain. 12/18/14   [provider]  omeprazole (PRILOSEC) 20 MG capsule Take 20 mg by mouth daily.    [provider]  oxymetazoline (AFRIN) 0.05 % nasal spray Place 1 spray into both nostrils 2 (two) times daily as needed for congestion. Mucinex brand    [provider]  potassium chloride (K-DUR) 10 MEQ tablet Take 10 mEq by  mouth daily with breakfast.    [provider]  rosuvastatin (CRESTOR) 20 MG tablet Take 1 tablet (20 mg total) by mouth daily. 12/31/21   Leroy Sea, MD  SPIRIVA HANDIHALER 18 MCG inhalation capsule Place 18 mcg into inhaler and inhale at bedtime. 08/16/13   [provider]  traMADol (ULTRAM) 50 MG tablet Take 50 mg by mouth 3 (three) times daily as needed for pain. 03/06/22   [provider]      Allergies    Actos [pioglitazone], Lipitor [atorvastatin], and Zestril [lisinopril]    Review of Systems   Review of Systems  Unable to perform ROS: Mental status change    Physical Exam Updated Vital Signs BP (!) 203/93 (BP Location: Left  Arm)   Pulse 97   Resp 14   Ht 5\' 1"  (1.549 m)   Wt 43.5 kg   SpO2 100%   BMI 18.12 kg/m  Physical Exam Vitals and nursing note reviewed.  Constitutional:      General: He is not in acute distress.    Appearance: Normal appearance. He is well-developed and normal weight.     Comments: Hard of hearing Disheveled  HENT:     Head: Normocephalic and atraumatic.     Mouth/Throat:     Pharynx: No oropharyngeal exudate.  Eyes:     Conjunctiva/sclera: Conjunctivae normal.     Pupils: Pupils are equal, round, and reactive to light.  Neck:     Comments: No meningismus. Cardiovascular:     Rate and Rhythm: Normal rate and regular rhythm.     Heart sounds: Normal heart sounds. No murmur heard. Pulmonary:     Effort: Pulmonary effort is normal. No respiratory distress.     Breath sounds: Normal breath sounds.  Chest:     Chest wall: No tenderness.  Abdominal:     Palpations: Abdomen is soft.     Tenderness: There is no abdominal tenderness. There is no guarding or rebound.  Musculoskeletal:        General: No tenderness. Normal range of motion.     Cervical back: Normal range of motion and neck supple.     Comments: Intact PT pulse with Doppler, Not palp: Right.  Minimal monophasic signal on the right.  Skin:    General: Skin is warm.     Findings: Rash present.     Comments: Peterchial rash to arms or legs  Neurological:     Mental Status: He is alert and oriented to person, place, and time.     Cranial Nerves: No cranial nerve deficit.     Motor: No abnormal muscle tone.     Coordination: Coordination normal.     Comments: Oriented to person only.  No obvious facial droop.  No obvious dysarthria.  Unable to hold arms off the bed bilaterally but he states this is due to his rotator cuffs being bad.  Equal grip strength bilaterally.  Able to lift legs off the bed bilaterally.  Psychiatric:        Behavior: Behavior normal.     ED Results / Procedures / Treatments    Labs (all labs ordered are listed, but only abnormal results are displayed) Labs Reviewed  PROTIME-INR - Abnormal; Notable for the following components:      Result Value   Prothrombin Time 15.4 (*)    All other components within normal limits  CBC - Abnormal; Notable for the following components:   RBC 3.33 (*)    Hemoglobin  9.4 (*)    HCT 30.3 (*)    All other components within normal limits  DIFFERENTIAL - Abnormal; Notable for the following components:   Lymphs Abs 0.5 (*)    Abs Immature Granulocytes 0.08 (*)    All other components within normal limits  COMPREHENSIVE METABOLIC PANEL - Abnormal; Notable for the following components:   Sodium 128 (*)    CO2 13 (*)    Glucose, Bld 192 (*)    BUN 40 (*)    Creatinine, Ser 1.29 (*)    GFR, Estimated 58 (*)    Anion gap 17 (*)    All other components within normal limits  URINALYSIS, ROUTINE W REFLEX MICROSCOPIC - Abnormal; Notable for the following components:   Hgb urine dipstick SMALL (*)    Ketones, ur 5 (*)    Protein, ur 100 (*)    All other components within normal limits  I-STAT CHEM 8, ED - Abnormal; Notable for the following components:   Sodium 128 (*)    BUN 47 (*)    Glucose, Bld 192 (*)    Calcium, Ion 1.09 (*)    TCO2 15 (*)    Hemoglobin 10.5 (*)    HCT 31.0 (*)    All other components within normal limits  I-STAT VENOUS BLOOD GAS, ED - Abnormal; Notable for the following components:   pCO2, Ven 29.4 (*)    pO2, Ven 48 (*)    Bicarbonate 15.8 (*)    TCO2 17 (*)    Acid-base deficit 9.0 (*)    Sodium 129 (*)    HCT 30.0 (*)    Hemoglobin 10.2 (*)    All other components within normal limits  ETHANOL  APTT  RAPID URINE DRUG SCREEN, HOSP PERFORMED  LIPID PANEL  HEMOGLOBIN A1C  LIPASE, BLOOD  TROPONIN I (HIGH SENSITIVITY)  TROPONIN I (HIGH SENSITIVITY)    EKG EKG Interpretation  Date/Time:  Thursday Jan 14 2023 11:09:47 EDT Ventricular Rate:  96 PR Interval:  177 QRS Duration: 106 QT  Interval:  348 QTC Calculation: 440 R Axis:   103 Text Interpretation: Sinus rhythm Atrial premature complex Anteroseptal infarct, age indeterminate Artifact in lead(s) I II III aVF No significant change was found Confirmed by Glynn Octave [phone]) on 01/14/2023 12:28:52 PM  Radiology CT ABDOMEN PELVIS WO CONTRAST  Result Date: 01/14/2023 CLINICAL DATA:  Lactic acidosis, back pain EXAM: CT ABDOMEN AND PELVIS WITHOUT CONTRAST TECHNIQUE: Multidetector CT imaging of the abdomen and pelvis was performed following the standard protocol without IV contrast. RADIATION DOSE REDUCTION: This exam was performed according to the departmental dose-optimization program which includes automated exposure control, adjustment of the mA and/or kV according to patient size and/or use of iterative reconstruction technique. COMPARISON:  Abdomen radiograph 01/14/2023, CT 12/20/2021 FINDINGS: Lower chest: Lung bases demonstrate small right-sided pleural effusion. Mild ground-glass density in the right lower lobe. Emphysema. Coronary vascular calcification Hepatobiliary: Distended gallbladder without calcified stone. No focal hepatic abnormality. Common bile duct is dilated, measuring up to 19 mm. No calcified stones are visualized. Suspicion of mild intra hepatic biliary dilatation. Pancreas: No inflammation.  Atrophic. Spleen: Normal in size without focal abnormality. Adrenals/Urinary Tract: Adrenal glands are within normal limits. Kidneys show no hydronephrosis. Punctate nonobstructing right kidney stones. The bladder is distended Stomach/Bowel: Stomach nonenlarged. No dilated small bowel. Possible mild wall thickening at the hepatic flexure. Nonvisualized appendix. Vascular/Lymphatic: Advanced aortic atherosclerosis. No aneurysm. No suspicious lymph nodes. Reproductive: Prostate is unremarkable. Other: Negative for pelvic  effusion or free air. Musculoskeletal: No acute or suspicious osseous abnormality. IMPRESSION: 1. Small  right-sided pleural effusion. Mild ground-glass density in the right lower lobe could be due to mild pneumonia. 2. Distended gallbladder without calcified stone. Dilated common bile duct measuring up to 19 mm with suspicion of intra hepatic biliary dilatation. Suggest correlation with LFTs and follow-up MRCP as indicated. 3. Possible mild wall thickening at the hepatic flexure of the colon, correlate for any history of diarrhea/colitis type symptoms 4. Punctate nonobstructing right kidney stones. 5. Aortic atherosclerosis. Aortic Atherosclerosis (ICD10-I70.0). Electronically Signed   By: Jasmine Pang M.D.   On: 01/14/2023 15:36   DG Abd Portable 1V  Result Date: 01/14/2023 CLINICAL DATA:  [id number] Pain [id number] EXAM: PORTABLE ABDOMEN - 1 VIEW COMPARISON:  CT 12/20/2021 FINDINGS: Effusion and airspace disease at the right lung base. Stomach and small bowel are nondilated. Moderate colonic fecal material without dilatation. Regional bones unremarkable. Left common iliac and right femoral vascular stents. IMPRESSION: Nonobstructive bowel gas pattern with moderate colonic fecal material. Electronically Signed   By: Corlis Leak M.D.   On: 01/14/2023 14:34   DG Chest Port 1 View  Result Date: 01/14/2023 CLINICAL DATA:  Pain. EXAM: PORTABLE CHEST 1 VIEW COMPARISON:  03/05/2022. FINDINGS: Rotated patient. No consolidation or pulmonary edema. Chronic scarring in the right lung base. Normal heart size. Stable mediastinal contours with postoperative changes of median sternotomy and CABG. No pleural effusion or pneumothorax. Visualized bones and upper abdomen are unremarkable. IMPRESSION: No evidence of acute cardiopulmonary disease. Electronically Signed   By: Orvan Falconer M.D.   On: 01/14/2023 12:18   CT HEAD WO CONTRAST  Result Date: 01/14/2023 CLINICAL DATA:  Neuro deficit. Stroke suspected. Weakness and confusion. EXAM: CT HEAD WITHOUT CONTRAST TECHNIQUE: Contiguous axial images were obtained from the base of  the skull through the vertex without intravenous contrast. RADIATION DOSE REDUCTION: This exam was performed according to the departmental dose-optimization program which includes automated exposure control, adjustment of the mA and/or kV according to patient size and/or use of iterative reconstruction technique. COMPARISON:  12/26/2021 FINDINGS: Brain: No evidence of acute infarction, hemorrhage, hydrocephalus, extra-axial collection or mass lesion/mass effect. There is mild low-attenuation within the subcortical and periventricular white matter compatible with chronic microvascular disease. Prominence of the sulci and ventricles compatible with brain atrophy. Vascular: No hyperdense vessel or unexpected calcification. Skull: Normal. Negative for fracture or focal lesion. Sinuses/Orbits: Postoperative changes involving the paranasal sinuses. Mucosal thickening is noted involving bilateral maxillary sinuses. No sinus fluid levels. Other: None IMPRESSION: 1. No acute intracranial abnormalities. 2. Chronic microvascular disease and brain atrophy. Electronically Signed   By: Signa Kell M.D.   On: 01/14/2023 12:10    Procedures .Critical Care  Performed by: Glynn Octave, MD Authorized by: Glynn Octave, MD   Critical care provider statement:    Critical care time (minutes):  35   Critical care time was exclusive of:  Separately billable procedures and treating other patients   Critical care was necessary to treat or prevent imminent or life-threatening deterioration of the following conditions:  Metabolic crisis and dehydration (metabolic acidosis)   Critical care was time spent personally by me on the following activities:  Development of treatment plan with patient or surrogate, discussions with consultants, evaluation of patient's response to treatment, examination of patient, ordering and review of laboratory studies, ordering and review of radiographic studies, ordering and performing  treatments and interventions, pulse oximetry, re-evaluation of patient's condition, review of old charts, blood  draw for specimens and obtaining history from patient or surrogate   I assumed direction of critical care for this patient from another provider in my specialty: no     Care discussed with: admitting provider       Medications Ordered in ED Medications - No data to display  ED Course/ Medical Decision Making/ A&P                             Medical Decision Making Amount and/or Complexity of Data Reviewed Independent Historian: EMS Labs: ordered. Decision-making details documented in ED Course. Radiology: ordered and independent interpretation performed. Decision-making details documented in ED Course. ECG/medicine tests: ordered and independent interpretation performed. Decision-making details documented in ED Course.  Risk Decision regarding hospitalization.   Altered mental status with concern for possible stroke.  Has not been himself for at least 2 days per EMS.  Has been incontinent of feces, difficulty walking, confusion and difficulty with ADLs.  Questionable slurred speech since last night.  Patient not TNK candidate given delay in presentation. He also does take eliquis. D/w Dr. Selina Cooley of neurology who agrees with not activating code stroke at this time.   CT head negative for hemorrhage or large infarct.  Results reviewed interpreted by me.  Chest x-ray negative for infiltrate.  Sodium slightly low 128. IVF given.  Bicarb low at 13.  Anion gap is 19.  VBG shows normal pH however.  metabolic acidosis of unclear etiology.  Additional IV fluids are given. Despite petechial rash, platelet count is normal.  Question whether this rash could be secondary to bedbugs.  CT a/p pursued to evaluate source of possible high anion gap acidosis. UA negative for infection. CXR negative.  CT with distended gallbladder and CBD but no gallstones seen. LFTs normal. Small R pleural  effusion with possible airspace disease. Will treat as possible pneumonia.  MRI Brain to be obtained given speech changes and AMS. Will obtain MRI lumbar spine as well given RLE weakness and reported incontinence.   Admission d/w Dr. Ophelia Charter.        Final Clinical Impression(s) / ED Diagnoses Final diagnoses:  None    Rx / DC Orders ED Discharge Orders     None         Terrie Haring, Jeannett Senior, MD 01/14/23 1745

## 2023-01-14 NOTE — ED Notes (Signed)
Pt to MRI. Well appearing upon transport. Pt will be transported to room after MRI is complete.

## 2023-01-15 ENCOUNTER — Observation Stay (HOSPITAL_COMMUNITY): Payer: Medicare Other

## 2023-01-15 ENCOUNTER — Inpatient Hospital Stay (HOSPITAL_COMMUNITY): Payer: Medicare Other

## 2023-01-15 ENCOUNTER — Observation Stay (HOSPITAL_BASED_OUTPATIENT_CLINIC_OR_DEPARTMENT_OTHER): Payer: Medicare Other

## 2023-01-15 DIAGNOSIS — Z01818 Encounter for other preprocedural examination: Secondary | ICD-10-CM | POA: Diagnosis not present

## 2023-01-15 DIAGNOSIS — J690 Pneumonitis due to inhalation of food and vomit: Secondary | ICD-10-CM | POA: Diagnosis not present

## 2023-01-15 DIAGNOSIS — Z789 Other specified health status: Secondary | ICD-10-CM | POA: Diagnosis not present

## 2023-01-15 DIAGNOSIS — L859 Epidermal thickening, unspecified: Secondary | ICD-10-CM | POA: Diagnosis not present

## 2023-01-15 DIAGNOSIS — Z89611 Acquired absence of right leg above knee: Secondary | ICD-10-CM | POA: Diagnosis not present

## 2023-01-15 DIAGNOSIS — E43 Unspecified severe protein-calorie malnutrition: Secondary | ICD-10-CM | POA: Diagnosis present

## 2023-01-15 DIAGNOSIS — Z7901 Long term (current) use of anticoagulants: Secondary | ICD-10-CM | POA: Diagnosis not present

## 2023-01-15 DIAGNOSIS — J449 Chronic obstructive pulmonary disease, unspecified: Secondary | ICD-10-CM

## 2023-01-15 DIAGNOSIS — K838 Other specified diseases of biliary tract: Secondary | ICD-10-CM | POA: Diagnosis not present

## 2023-01-15 DIAGNOSIS — Z7189 Other specified counseling: Secondary | ICD-10-CM | POA: Diagnosis not present

## 2023-01-15 DIAGNOSIS — R64 Cachexia: Secondary | ICD-10-CM | POA: Diagnosis present

## 2023-01-15 DIAGNOSIS — Z681 Body mass index (BMI) 19 or less, adult: Secondary | ICD-10-CM | POA: Diagnosis not present

## 2023-01-15 DIAGNOSIS — I5030 Unspecified diastolic (congestive) heart failure: Secondary | ICD-10-CM

## 2023-01-15 DIAGNOSIS — M545 Low back pain, unspecified: Secondary | ICD-10-CM | POA: Diagnosis not present

## 2023-01-15 DIAGNOSIS — G928 Other toxic encephalopathy: Secondary | ICD-10-CM | POA: Diagnosis not present

## 2023-01-15 DIAGNOSIS — G9341 Metabolic encephalopathy: Secondary | ICD-10-CM | POA: Diagnosis not present

## 2023-01-15 DIAGNOSIS — Z95820 Peripheral vascular angioplasty status with implants and grafts: Secondary | ICD-10-CM | POA: Diagnosis not present

## 2023-01-15 DIAGNOSIS — Z95828 Presence of other vascular implants and grafts: Secondary | ICD-10-CM | POA: Diagnosis not present

## 2023-01-15 DIAGNOSIS — I7090 Unspecified atherosclerosis: Secondary | ICD-10-CM | POA: Diagnosis not present

## 2023-01-15 DIAGNOSIS — I10 Essential (primary) hypertension: Secondary | ICD-10-CM

## 2023-01-15 DIAGNOSIS — R5381 Other malaise: Secondary | ICD-10-CM | POA: Diagnosis not present

## 2023-01-15 DIAGNOSIS — I70235 Atherosclerosis of native arteries of right leg with ulceration of other part of foot: Secondary | ICD-10-CM | POA: Diagnosis not present

## 2023-01-15 DIAGNOSIS — R932 Abnormal findings on diagnostic imaging of liver and biliary tract: Secondary | ICD-10-CM | POA: Diagnosis not present

## 2023-01-15 DIAGNOSIS — I2510 Atherosclerotic heart disease of native coronary artery without angina pectoris: Secondary | ICD-10-CM | POA: Diagnosis not present

## 2023-01-15 DIAGNOSIS — I480 Paroxysmal atrial fibrillation: Secondary | ICD-10-CM | POA: Diagnosis present

## 2023-01-15 DIAGNOSIS — R29898 Other symptoms and signs involving the musculoskeletal system: Secondary | ICD-10-CM | POA: Diagnosis not present

## 2023-01-15 DIAGNOSIS — K8020 Calculus of gallbladder without cholecystitis without obstruction: Secondary | ICD-10-CM | POA: Diagnosis not present

## 2023-01-15 DIAGNOSIS — K3189 Other diseases of stomach and duodenum: Secondary | ICD-10-CM | POA: Diagnosis not present

## 2023-01-15 DIAGNOSIS — R627 Adult failure to thrive: Secondary | ICD-10-CM | POA: Diagnosis present

## 2023-01-15 DIAGNOSIS — Z951 Presence of aortocoronary bypass graft: Secondary | ICD-10-CM | POA: Diagnosis not present

## 2023-01-15 DIAGNOSIS — N1831 Chronic kidney disease, stage 3a: Secondary | ICD-10-CM | POA: Diagnosis present

## 2023-01-15 DIAGNOSIS — R110 Nausea: Secondary | ICD-10-CM | POA: Diagnosis not present

## 2023-01-15 DIAGNOSIS — E1151 Type 2 diabetes mellitus with diabetic peripheral angiopathy without gangrene: Secondary | ICD-10-CM | POA: Diagnosis not present

## 2023-01-15 DIAGNOSIS — E119 Type 2 diabetes mellitus without complications: Secondary | ICD-10-CM | POA: Diagnosis not present

## 2023-01-15 DIAGNOSIS — M7989 Other specified soft tissue disorders: Secondary | ICD-10-CM | POA: Diagnosis not present

## 2023-01-15 DIAGNOSIS — K8070 Calculus of gallbladder and bile duct without cholecystitis without obstruction: Secondary | ICD-10-CM | POA: Diagnosis present

## 2023-01-15 DIAGNOSIS — R339 Retention of urine, unspecified: Secondary | ICD-10-CM | POA: Diagnosis not present

## 2023-01-15 DIAGNOSIS — Z515 Encounter for palliative care: Secondary | ICD-10-CM | POA: Diagnosis not present

## 2023-01-15 DIAGNOSIS — E8729 Other acidosis: Secondary | ICD-10-CM | POA: Diagnosis not present

## 2023-01-15 DIAGNOSIS — R0602 Shortness of breath: Secondary | ICD-10-CM | POA: Diagnosis not present

## 2023-01-15 DIAGNOSIS — G8918 Other acute postprocedural pain: Secondary | ICD-10-CM | POA: Diagnosis not present

## 2023-01-15 DIAGNOSIS — F17200 Nicotine dependence, unspecified, uncomplicated: Secondary | ICD-10-CM | POA: Diagnosis not present

## 2023-01-15 DIAGNOSIS — E222 Syndrome of inappropriate secretion of antidiuretic hormone: Secondary | ICD-10-CM | POA: Diagnosis present

## 2023-01-15 DIAGNOSIS — Z7401 Bed confinement status: Secondary | ICD-10-CM | POA: Diagnosis not present

## 2023-01-15 DIAGNOSIS — E785 Hyperlipidemia, unspecified: Secondary | ICD-10-CM | POA: Diagnosis present

## 2023-01-15 DIAGNOSIS — N200 Calculus of kidney: Secondary | ICD-10-CM | POA: Diagnosis not present

## 2023-01-15 DIAGNOSIS — K5900 Constipation, unspecified: Secondary | ICD-10-CM | POA: Diagnosis not present

## 2023-01-15 DIAGNOSIS — I129 Hypertensive chronic kidney disease with stage 1 through stage 4 chronic kidney disease, or unspecified chronic kidney disease: Secondary | ICD-10-CM | POA: Diagnosis present

## 2023-01-15 DIAGNOSIS — Z66 Do not resuscitate: Secondary | ICD-10-CM

## 2023-01-15 DIAGNOSIS — R4781 Slurred speech: Secondary | ICD-10-CM | POA: Diagnosis present

## 2023-01-15 DIAGNOSIS — L97419 Non-pressure chronic ulcer of right heel and midfoot with unspecified severity: Secondary | ICD-10-CM | POA: Diagnosis present

## 2023-01-15 DIAGNOSIS — Z8673 Personal history of transient ischemic attack (TIA), and cerebral infarction without residual deficits: Secondary | ICD-10-CM | POA: Diagnosis not present

## 2023-01-15 DIAGNOSIS — R0600 Dyspnea, unspecified: Secondary | ICD-10-CM | POA: Diagnosis not present

## 2023-01-15 DIAGNOSIS — K8050 Calculus of bile duct without cholangitis or cholecystitis without obstruction: Secondary | ICD-10-CM | POA: Diagnosis not present

## 2023-01-15 DIAGNOSIS — J9601 Acute respiratory failure with hypoxia: Secondary | ICD-10-CM | POA: Diagnosis not present

## 2023-01-15 DIAGNOSIS — I739 Peripheral vascular disease, unspecified: Secondary | ICD-10-CM | POA: Diagnosis not present

## 2023-01-15 DIAGNOSIS — E1122 Type 2 diabetes mellitus with diabetic chronic kidney disease: Secondary | ICD-10-CM | POA: Diagnosis present

## 2023-01-15 DIAGNOSIS — I70261 Atherosclerosis of native arteries of extremities with gangrene, right leg: Secondary | ICD-10-CM | POA: Diagnosis present

## 2023-01-15 DIAGNOSIS — Z87891 Personal history of nicotine dependence: Secondary | ICD-10-CM | POA: Diagnosis not present

## 2023-01-15 DIAGNOSIS — N179 Acute kidney failure, unspecified: Secondary | ICD-10-CM | POA: Diagnosis not present

## 2023-01-15 DIAGNOSIS — R531 Weakness: Secondary | ICD-10-CM | POA: Diagnosis not present

## 2023-01-15 DIAGNOSIS — E1152 Type 2 diabetes mellitus with diabetic peripheral angiopathy with gangrene: Secondary | ICD-10-CM | POA: Diagnosis present

## 2023-01-15 DIAGNOSIS — J90 Pleural effusion, not elsewhere classified: Secondary | ICD-10-CM | POA: Diagnosis not present

## 2023-01-15 DIAGNOSIS — R935 Abnormal findings on diagnostic imaging of other abdominal regions, including retroperitoneum: Secondary | ICD-10-CM | POA: Diagnosis not present

## 2023-01-15 DIAGNOSIS — M6281 Muscle weakness (generalized): Secondary | ICD-10-CM | POA: Diagnosis not present

## 2023-01-15 DIAGNOSIS — R112 Nausea with vomiting, unspecified: Secondary | ICD-10-CM | POA: Diagnosis not present

## 2023-01-15 DIAGNOSIS — E11621 Type 2 diabetes mellitus with foot ulcer: Secondary | ICD-10-CM | POA: Diagnosis present

## 2023-01-15 LAB — LIPID PANEL
Cholesterol: 127 mg/dL (ref 0–200)
HDL: 28 mg/dL — ABNORMAL LOW (ref >40)
LDL Cholesterol: 63 mg/dL (ref 0–99)
Total CHOL/HDL Ratio: 4.5 RATIO
Triglycerides: 179 mg/dL — ABNORMAL HIGH (ref <150)
VLDL: 36 mg/dL (ref 0–40)

## 2023-01-15 LAB — ECHOCARDIOGRAM COMPLETE
Area-P 1/2: 4.26 cm2
Calc EF: 60 %
Height: 61 in
S' Lateral: 3 cm
Single Plane A2C EF: 59.6 %
Single Plane A4C EF: 57.5 %
Weight: 1534.4 oz

## 2023-01-15 LAB — COMPREHENSIVE METABOLIC PANEL
ALT: 19 U/L (ref 0–44)
AST: 29 U/L (ref 15–41)
Albumin: 3.3 g/dL — ABNORMAL LOW (ref 3.5–5.0)
Alkaline Phosphatase: 55 U/L (ref 38–126)
Anion gap: 11 (ref 5–15)
BUN: 16 mg/dL (ref 8–23)
CO2: 19 mmol/L — ABNORMAL LOW (ref 22–32)
Calcium: 8.6 mg/dL — ABNORMAL LOW (ref 8.9–10.3)
Chloride: 101 mmol/L (ref 98–111)
Creatinine, Ser: 0.95 mg/dL (ref 0.61–1.24)
GFR, Estimated: >60 mL/min (ref >60)
Glucose, Bld: 202 mg/dL — ABNORMAL HIGH (ref 70–99)
Potassium: 3.5 mmol/L (ref 3.5–5.1)
Sodium: 131 mmol/L — ABNORMAL LOW (ref 135–145)
Total Bilirubin: 0.5 mg/dL (ref 0.3–1.2)
Total Protein: 6.2 g/dL — ABNORMAL LOW (ref 6.5–8.1)

## 2023-01-15 LAB — HEMOGLOBIN A1C
Hgb A1c MFr Bld: 8.2 % — ABNORMAL HIGH (ref 4.8–5.6)
Mean Plasma Glucose: 189 mg/dL

## 2023-01-15 LAB — CBC
HCT: 31.5 % — ABNORMAL LOW (ref 39.0–52.0)
Hemoglobin: 10.2 g/dL — ABNORMAL LOW (ref 13.0–17.0)
MCH: 28.7 pg (ref 26.0–34.0)
MCHC: 32.4 g/dL (ref 30.0–36.0)
MCV: 88.5 fL (ref 80.0–100.0)
Platelets: 277 10*3/uL (ref 150–400)
RBC: 3.56 MIL/uL — ABNORMAL LOW (ref 4.22–5.81)
RDW: 14.3 % (ref 11.5–15.5)
WBC: 6.5 10*3/uL (ref 4.0–10.5)
nRBC: 0 % (ref 0.0–0.2)

## 2023-01-15 LAB — GLUCOSE, CAPILLARY
Glucose-Capillary: 200 mg/dL — ABNORMAL HIGH (ref 70–99)
Glucose-Capillary: 197 mg/dL — ABNORMAL HIGH (ref 70–99)
Glucose-Capillary: 287 mg/dL — ABNORMAL HIGH (ref 70–99)

## 2023-01-15 MED ORDER — PERFLUTREN LIPID MICROSPHERE
1.0000 mL | INTRAVENOUS | Status: AC | PRN
  Administered 2023-01-15: 4 mL via INTRAVENOUS
  Filled 2023-01-15: qty 10

## 2023-01-15 MED ORDER — ADULT MULTIVITAMIN W/MINERALS CH
1.0000 | ORAL_TABLET | Freq: Every day | ORAL | Status: DC
  Administered 2023-01-15 – 2023-01-29 (×14): 1 via ORAL
  Filled 2023-01-15 (×15): qty 1

## 2023-01-15 MED ORDER — GADOBUTROL 1 MMOL/ML IV SOLN
4.0000 mL | Freq: Once | INTRAVENOUS | Status: AC | PRN
  Administered 2023-01-15: 4 mL via INTRAVENOUS

## 2023-01-15 MED ORDER — APIXABAN 5 MG PO TABS
5.0000 mg | ORAL_TABLET | Freq: Two times a day (BID) | ORAL | Status: DC
  Administered 2023-01-15: 5 mg via ORAL
  Filled 2023-01-15 (×2): qty 1

## 2023-01-15 MED ORDER — LORAZEPAM 2 MG/ML IJ SOLN
1.0000 mg | Freq: Once | INTRAMUSCULAR | Status: AC | PRN
  Administered 2023-01-15: 1 mg via INTRAVENOUS
  Filled 2023-01-15: qty 1

## 2023-01-15 MED ORDER — ENSURE ENLIVE PO LIQD
237.0000 mL | Freq: Two times a day (BID) | ORAL | Status: DC
  Administered 2023-01-16 – 2023-01-21 (×10): 237 mL via ORAL

## 2023-01-15 NOTE — Evaluation (Signed)
Occupational Therapy Evaluation Patient Details Name: Reginald Ayers MRN: [id number] DOB: 08-Jun-1951 Today's Date: 01/15/2023   History of Present Illness 72 y/o M admitted to [facility] on 5/23 for weakness and pain in his RLE, pt found in fecal matter and bedbugs. MRI of brain with no acute abnormality, chronically occluded left ICA. CT of abdomen/pelvis with mild groundglass denisty in the RLL, distended gallbladder without calcified stone, dilated CBD with suspicion for intrahepatic dilatation. PMHx: COPD, CAD s/p CABG, DM, HTN, HLD, PAD s/p stent, tobacco dependence.   Clinical Impression   At baseline, pt completes ADLs Independent to Mod I with use of sock aid and bathing at sink. At baseline, pt completes functional transfers/mobility with Mod I with SPC in the home and RW (2 wheel) in the community. Pt reports 2 recent falls at home. Pt presents with decreased B UE generalized strength, decreased activity tolerance, decreased balance during functional tasks, and decreased safety and independence with ADLs and functional mobility. Pt currently completes UB ADLs with Mod I to Min assist, LB ADLs with Min to Mod assist, and functional mobility/transfers with RW with Min to Mod assist. Pt currently requires occasional cues for sequencing and safety throughout functional tasks. Pt will benefit from acute skilled OT services to address deficits outlined below and increase safety and independence with ADLs and functional mobility/transfers. Post acute discharge, pt will benefit from intensive inpatient skilled OT services < 3 hours per day.      Recommendations for follow up therapy are one component of a multi-disciplinary discharge planning process, led by the attending physician.  Recommendations may be updated based on patient status, additional functional criteria and insurance authorization.   Assistance Recommended at Discharge Frequent or constant Supervision/Assistance  Patient can return home with  the following A little help with walking and/or transfers;A lot of help with bathing/dressing/bathroom;Assistance with cooking/housework;Direct supervision/assist for medications management;Direct supervision/assist for financial management;Assist for transportation;Help with stairs or ramp for entrance    Functional Status Assessment  Patient has had a recent decline in their functional status and demonstrates the ability to make significant improvements in function in a reasonable and predictable amount of time.  Equipment Recommendations  Other (comment) (Defer to next level of care)    Recommendations for Other Services       Precautions / Restrictions Precautions Precautions: Fall Restrictions Weight Bearing Restrictions: No      Mobility Bed Mobility Overal bed mobility: Needs Assistance Bed Mobility: Supine to Sit     Supine to sit: Mod assist     General bed mobility comments: modA for trunk support and balance with assist for scooting hips to EOB, increased time to complete    Transfers Overall transfer level: Needs assistance Equipment used: Rolling walker (2 wheels) Transfers: Sit to/from Stand, Bed to chair/wheelchair/BSC Sit to Stand: Mod assist     Step pivot transfers: Mod assist, Min assist (Mod assist to power up)     General transfer comment: modA to power up, cues for proper hand placement      Balance Overall balance assessment: Needs assistance Sitting-balance support: Bilateral upper extremity supported, Feet supported Sitting balance-Leahy Scale: Fair   Postural control: Posterior lean Standing balance support: Bilateral upper extremity supported, Reliant on assistive device for balance, During functional activity Standing balance-Leahy Scale: Poor Standing balance comment: reliant on RW and intermittent external support for balance  ADL either performed or assessed with clinical judgement   ADL Overall  ADL's : Needs assistance/impaired Eating/Feeding: Modified independent;Sitting   Grooming: Modified independent;Sitting   Upper Body Bathing: Minimal assistance;Sitting;Cueing for sequencing   Lower Body Bathing: Moderate assistance;Cueing for safety;Cueing for sequencing;Sit to/from stand   Upper Body Dressing : Min guard;Sitting   Lower Body Dressing: Moderate assistance;With adaptive equipment;Cueing for safety;Cueing for sequencing;Cueing for compensatory techniques;Sit to/from stand   Toilet Transfer: Moderate assistance;Minimal assistance;Cueing for safety;Cueing for sequencing;Ambulation;BSC/3in1;Rolling walker (2 wheels) Toilet Transfer Details (indicate cue type and reason): Mod assist to power up Toileting- Clothing Manipulation and Hygiene: Moderate assistance;Cueing for safety;Cueing for sequencing;Sit to/from stand       Functional mobility during ADLs: Minimal assistance;Rolling walker (2 wheels) General ADL Comments: Decreased activity tolerance     Vision Baseline Vision/History: 1 Wears glasses Ability to See in Adequate Light: 0 Adequate Patient Visual Report: No change from baseline       Perception     Praxis Praxis Praxis tested?: Deficits Deficits: Organization    Pertinent Vitals/Pain Pain Assessment Pain Assessment: Faces Faces Pain Scale: Hurts a little bit Pain Location: RLE Pain Descriptors / Indicators: Sore Pain Intervention(s): Limited activity within patient's tolerance, Monitored during session     Hand Dominance Right   Extremity/Trunk Assessment Upper Extremity Assessment Upper Extremity Assessment: Generalized weakness;RUE deficits/detail;LUE deficits/detail RUE Deficits / Details: Gernealized weakness, AROM/PROM shoulder flextion to approx. 90 degrees (Pt reports ROM is at baseline.) RUE Sensation: WNL RUE Coordination: decreased fine motor;decreased gross motor LUE Deficits / Details: generalized weakness, AROM/PROM shoulder  flexion to approx. 90 degrees (Pt reports ROM is at baseline.) LUE Sensation: WNL LUE Coordination: decreased fine motor;decreased gross motor   Lower Extremity Assessment Lower Extremity Assessment: Defer to PT evaluation   Cervical / Trunk Assessment Cervical / Trunk Assessment: Kyphotic   Communication Communication Communication: HOH   Cognition Arousal/Alertness: Awake/alert Behavior During Therapy: WFL for tasks assessed/performed Overall Cognitive Status: No family/caregiver present to determine baseline cognitive functioning                                 General Comments: >o family present, pt following 1-step commands but limited by [facility], requiring verbal and visual cues for safety and sequencing, and increased time for processing     General Comments  VSS on RA throughout session.    Exercises     Shoulder Instructions      Home Living Family/patient expects to be discharged to:: Private residence Living Arrangements: Other relatives;Non-relatives/Friends Available Help at Discharge: Available PRN/intermittently;Friend(s);Other (Comment) Type of Home: House Home Access: Level entry     Home Layout: One level     Bathroom Shower/Tub: Chief Strategy Officer: Standard Bathroom Accessibility: Yes   Home Equipment: Agricultural consultant (2 wheels);Cane - single point;BSC/3in1;Rollator (4 wheels);Other (comment) (adjustable bed)   Additional Comments: lives with roommates/friends      Prior Functioning/Environment Prior Level of Function : History of Falls (last six months);Independent/Modified Independent             Mobility Comments: SPC in the house and RW out in the community, admits to 2 falls in the house ADLs Comments: Pt reports Independent to Mod I with ADLs with use of sock aid. Pt bathes at sink sitting on Rollator due to inablilty to safely complete tub/transfer.        OT Problem List: Decreased strength;Decreased  activity tolerance;Decreased  range of motion;Impaired balance (sitting and/or standing);Decreased coordination;Decreased cognition;Decreased safety awareness      OT Treatment/Interventions: Self-care/ADL training;Therapeutic exercise;Energy conservation;DME and/or AE instruction;Therapeutic activities;Patient/family education;Balance training    OT Goals(Current goals can be found in the care plan section) Acute Rehab OT Goals Patient Stated Goal: To get stronger and have more energy OT Goal Formulation: With patient Time For Goal Achievement: 01/29/23 Potential to Achieve Goals: Good ADL Goals Pt Will Perform Lower Body Bathing: with min guard assist;sit to/from stand (with use of AE as needed) Pt Will Perform Lower Body Dressing: with min guard assist;sit to/from stand (with AE as needed) Pt Will Transfer to Toilet: with modified independence;regular height toilet;ambulating Pt Will Perform Toileting - Clothing Manipulation and hygiene: with modified independence;sit to/from stand  OT Frequency: Min 2X/week    Co-evaluation              AM-PAC OT "6 Clicks" Daily Activity     Outcome Measure Help from another person eating meals?: None Help from another person taking care of personal grooming?: A Little Help from another person toileting, which includes using toliet, bedpan, or urinal?: A Lot Help from another person bathing (including washing, rinsing, drying)?: A Lot Help from another person to put on and taking off regular upper body clothing?: A Little Help from another person to put on and taking off regular lower body clothing?: A Lot 6 Click Score: 16   End of Session Equipment Utilized During Treatment: Gait belt;Rolling walker (2 wheels) Nurse Communication: Mobility status  Activity Tolerance: Patient tolerated treatment well Patient left: in chair;with call bell/phone within reach;with chair alarm set  OT Visit Diagnosis: Unsteadiness on feet (R26.81);Other  abnormalities of gait and mobility (R26.89);History of falling (Z91.81);Muscle weakness (generalized) (M62.81);Other (comment) (decreased activity tolerance)                Time: 1610-9604 OT Time Calculation (min): 34 min Charges:  OT General Charges $OT Visit: 1 Visit OT Evaluation $OT Eval Moderate Complexity: 1 Mod OT Treatments $Self Care/Home Management : 8-22 mins  Akia Montalban "Orson Eva., OTR/L, MA Acute Rehab [phone]   Lendon Colonel 01/15/2023, 3:49 PM

## 2023-01-15 NOTE — TOC CM/SW Note (Signed)
Transition of Care [facility]) - Inpatient Brief Assessment   Patient Details  Name: Reginald Ayers MRN: [id number] Date of Birth: [birth year]  [facility]) CM/SW Contact:    Mearl Latin, LCSW Phone Number: 01/15/2023, 4:32 PM   Clinical Narrative: Patient from home with roommates and DME. Patient found with bedbug on him once admitted. CSW will follow up on SNF recommendation.    Transition of Care Asessment: Insurance and Status: Insurance coverage has been reviewed Patient has primary care physician: Yes Home environment has been reviewed: From home with roommates Prior level of function:: Independent with DME Prior/Current Home Services: No current home services Social Determinants of Health Reivew: SDOH reviewed needs interventions Readmission risk has been reviewed: Yes Transition of care needs: transition of care needs identified, TOC will continue to follow

## 2023-01-15 NOTE — Evaluation (Signed)
Physical Therapy Evaluation Patient Details Name: Reginald Ayers MRN: [id number] DOB: 1951/06/26 Today's Date: 01/15/2023  History of Present Illness  72 y/o M admitted to [facility] on 5/23 for weakness and pain in his RLE, pt found in fecal matter and bedbugs. MRI of brain with no acute abnormality, chronically occluded left ICA. CT of abdomen/pelvis with mild groundglass denisty in the RLL, distended gallbladder without calcified stone, dilated CBD with suspicion for intrahepatic dilatation. PMHx: COPD, CAD s/p CABG, DM, HTN, HLD, PAD s/p stent, tobacco dependence.  Clinical Impression  Pt presents today with impaired functional mobility, limited by strength, balance, and activity tolerance. Pt reports a history of falls, with 2 over the past few months in his home. Pt reports he lives with roommates/friends who help intermittently, but at baseline he is ambulatory with a RW or SPC. Today, pt required min-modA and RW for all mobility, ambulation limited to the room due to fatigue, with assist required for strength and balance deficits. Pt will continue to benefit from skilled acute PT at this time to progress mobility and address deficits, recommend subacute PT in a facility setting upon discharge. Will continue to follow as appropriate during admission.        Recommendations for follow up therapy are one component of a multi-disciplinary discharge planning process, led by the attending physician.  Recommendations may be updated based on patient status, additional functional criteria and insurance authorization.  Follow Up Recommendations Can patient physically be transported by private vehicle: Yes     Assistance Recommended at Discharge Frequent or constant Supervision/Assistance  Patient can return home with the following  A lot of help with walking and/or transfers;Assistance with cooking/housework;Assist for transportation;Help with stairs or ramp for entrance;A lot of help with  bathing/dressing/bathroom    Equipment Recommendations Other (comment) (defer to next level)  Recommendations for Other Services       Functional Status Assessment Patient has had a recent decline in their functional status and demonstrates the ability to make significant improvements in function in a reasonable and predictable amount of time.     Precautions / Restrictions Precautions Precautions: Fall Restrictions Weight Bearing Restrictions: No      Mobility  Bed Mobility Overal bed mobility: Needs Assistance Bed Mobility: Supine to Sit     Supine to sit: Mod assist     General bed mobility comments: modA for trunk support and balance with assist for scooting hips to EOB, increased time to complete    Transfers Overall transfer level: Needs assistance Equipment used: Rolling walker (2 wheels) Transfers: Sit to/from Stand Sit to Stand: Mod assist           General transfer comment: modA to power up, cues for proper hand placement    Ambulation/Gait Ambulation/Gait assistance: Min assist Gait Distance (Feet): 12 Feet Assistive device: Rolling walker (2 wheels) Gait Pattern/deviations: Step-through pattern, Trunk flexed, Drifts right/left, Shuffle, Decreased dorsiflexion - left, Decreased dorsiflexion - right, Decreased stride length, Leaning posteriorly Gait velocity: decreased     General Gait Details: minA for balance and strength, decreased B step length and foot clearance, mild posterior lean intermittently requiring assist to correct. Increased time required with brief standing rest breaks for conversation  Stairs            Wheelchair Mobility    Modified Rankin (Stroke Patients Only)       Balance Overall balance assessment: Needs assistance Sitting-balance support: Bilateral upper extremity supported, Feet supported Sitting balance-Leahy Scale: Fair  Postural control: Posterior lean Standing balance support: Bilateral upper extremity  supported, Reliant on assistive device for balance, During functional activity Standing balance-Leahy Scale: Poor Standing balance comment: reliant on RW and intermittent external support for balance                             Pertinent Vitals/Pain Pain Assessment Pain Assessment: Faces Faces Pain Scale: Hurts a little bit Pain Location: RLE Pain Descriptors / Indicators: Sore Pain Intervention(s): Limited activity within patient's tolerance, Monitored during session    Home Living Family/patient expects to be discharged to:: Private residence Living Arrangements: Other relatives;Non-relatives/Friends Available Help at Discharge: Available PRN/intermittently;Friend(s);Other (Comment) Type of Home: House Home Access: Level entry       Home Layout: One level Home Equipment: Agricultural consultant (2 wheels);Cane - single point;BSC/3in1;Rollator (4 wheels);Other (comment) (adjustable bed) Additional Comments: lives with roommates/friends    Prior Function Prior Level of Function : History of Falls (last six months);Independent/Modified Independent             Mobility Comments: SPC in the house and RW out in the community, admits to 2 falls in the house ADLs Comments: sponge bathing     Hand Dominance   Dominant Hand: Right    Extremity/Trunk Assessment   Upper Extremity Assessment Upper Extremity Assessment: Defer to OT evaluation    Lower Extremity Assessment Lower Extremity Assessment: Generalized weakness (reports soreness with resistance on RLE, limited ankle ROM on RLE with PROM)    Cervical / Trunk Assessment Cervical / Trunk Assessment: Kyphotic  Communication   Communication: HOH  Cognition Arousal/Alertness: Awake/alert Behavior During Therapy: WFL for tasks assessed/performed Overall Cognitive Status: No family/caregiver present to determine baseline cognitive functioning                                 General Comments: no family  present, pt following commands but limited by [facility], requiring verbal and visual cues and increased time for processing        General Comments General comments (skin integrity, edema, etc.): VSS on room air    Exercises     Assessment/Plan    PT Assessment Patient needs continued PT services  PT Problem List Decreased strength;Decreased activity tolerance;Decreased balance;Decreased mobility;Decreased knowledge of use of DME;Decreased safety awareness;Decreased knowledge of precautions       PT Treatment Interventions DME instruction;Gait training;Functional mobility training;Therapeutic activities;Therapeutic exercise;Balance training;Neuromuscular re-education;Cognitive remediation;Patient/family education    PT Goals (Current goals can be found in the Care Plan section)  Acute Rehab PT Goals Patient Stated Goal: get better PT Goal Formulation: With patient Time For Goal Achievement: 01/29/23 Potential to Achieve Goals: Fair    Frequency Min 3X/week     Co-evaluation               AM-PAC PT "6 Clicks" Mobility  Outcome Measure Help needed turning from your back to your side while in a flat bed without using bedrails?: A Little Help needed moving from lying on your back to sitting on the side of a flat bed without using bedrails?: A Lot Help needed moving to and from a bed to a chair (including a wheelchair)?: A Lot Help needed standing up from a chair using your arms (e.g., wheelchair or bedside chair)?: A Lot Help needed to walk in hospital room?: Total Help needed climbing 3-5 steps with a railing? : Total  6 Click Score: 11    End of Session Equipment Utilized During Treatment: Gait belt Activity Tolerance: Patient tolerated treatment well Patient left: in chair;with call bell/phone within reach;with chair alarm set Nurse Communication: Mobility status PT Visit Diagnosis: Unsteadiness on feet (R26.81);Muscle weakness (generalized) (M62.81);History of falling  (Z91.81);Difficulty in walking, not elsewhere classified (R26.2)    Time: 6010-9323 PT Time Calculation (min) (ACUTE ONLY): 30 min   Charges:   PT Evaluation $PT Eval Moderate Complexity: 1 Mod          Lindalou Hose, PT DPT Acute Rehabilitation Services Office [phone]   Leonie Man 01/15/2023, 2:34 PM

## 2023-01-15 NOTE — Progress Notes (Signed)
Echocardiogram 2D Echocardiogram has been performed.  Toni Amend 01/15/2023, 1:33 PM

## 2023-01-15 NOTE — Progress Notes (Signed)
Change apixaban 2.5mg  BID to 5mg  BID for a hx of AF (age<80, scr<1.5)  Ulyses Southward, PharmD, BCIDP, AAHIVP, CPP Infectious Disease Pharmacist 01/15/2023 11:47 AM

## 2023-01-15 NOTE — Progress Notes (Signed)
SLP Cancellation Note  Patient Details Name: Reginald Ayers MRN: [id number] DOB: [birth year]   Cancelled treatment:       Reason Eval/Treat Not Completed: Fatigue/lethargy limiting ability to participate. Pt unable to rouse long enough for participation in cognitive-linguistic evaluation.  Clyde Canterbury, M.S., CCC-SLP Speech-Language Pathologist Secure Chat Preferred  O: [phone]  Woodroe Chen 01/15/2023, 10:19 AM

## 2023-01-15 NOTE — Progress Notes (Addendum)
PROGRESS NOTE        PATIENT DETAILS Name: Reginald Ayers Age: 72 y.o. Sex: male Date of Birth: Apr 15, 1951 Admit Date: 01/14/2023 Admitting Physician Jonah Blue, MD ZOX:[id number], Selena Batten, NP  Brief Summary: Patient is a 72 y.o.  male COPD, CAD s/p CABG, PAD s/p stent, HLD, HTN-who presented with weakness.  Per H&P-patient was found at home with bedbugs, and covered in fecal matter.   Significant events: 5/23>> admit to [facility]  Significant studies: 5/23>> MRI brain: No acute abnormality-chronically occluded left ICA 5/23>> CT abdomen/pelvis: Mild groundglass density in the right lower lobe, distended gallbladder  without calcified stone, dilated CBD measuring up to 19 mm with suspicion for intrahepatic dilatation.  Significant microbiology data: None  Procedures: None  Consults: None  Subjective: Lying comfortably in bed-denies any chest pain or shortness of breath.  Feels better-claims he has been feeling "weak" for 1 week.  Denies any fever.  Denies any nausea or vomiting.  Denies any abdominal pain.  Lives with roommates.  Objective: Vitals: Blood pressure (!) 164/75, pulse 99, temperature 98.1 F (36.7 C), temperature source Oral, resp. rate 17, height 5\' 1"  (1.549 m), weight 43.5 kg, SpO2 100 %.   Exam: Gen Exam:Alert awake-not in any distress HEENT:atraumatic, normocephalic Chest: B/L clear to auscultation anteriorly CVS:S1S2 regular Abdomen:soft non tender, non distended Extremities:no edema Neurology: Non focal Skin: no rash  Pertinent Labs/Radiology:    Latest Ref Rng & Units 01/15/2023    7:17 AM 01/14/2023    2:12 PM 01/14/2023   11:29 AM  CBC  WBC 4.0 - 10.5 K/uL 6.5     Hemoglobin 13.0 - 17.0 g/dL 40.9  81.1  91.4   Hematocrit 39.0 - 52.0 % 31.5  30.0  31.0   Platelets 150 - 400 K/uL 277       Lab Results  Component Value Date   NA 131 (L) 01/15/2023   K 3.5 01/15/2023   CL 101 01/15/2023   CO2 19 (L) 01/15/2023       Assessment/Plan: Weakness Unclear etiology-apparently ongoing for at least 1 week-denies fever, denies any nausea/vomiting (although poor historian)-covered in feces when EMS found her but no obvious history of diarrhea.  MRI brain negative for acute CVA. PT/OT eval Continue to monitor  Anion gap metabolic acidosis Likely starvation ketoacidosis Improving with supportive care  Dilated common bile duct/intrahepatic dilatation No abdominal pain-LFTs stable ?  Passed a stone that would explain weakness/change in clinical situation MRCP ordered.  HTN BP stable continue beta-blocker  CAD-s/p CABG No anginal symptoms Aspirin/Plavix/statin/beta-blocker  History of CVA No obvious deficits on exam-MRI brain negative On statin/Eliquis/DAPT  PAF Sinus rhythm On Eliquis-see discussion below regarding DAPT/Eliquis.  PAD Has had numerous procedures in the past-follow with Dr. Myra Gianotti Continue aspirin/Plavix/statin Appears to be on Eliquis for PAF-will discuss with vascular surgery to see if we still need dual antiplatelets.  Addendum Spoke with Dr. Rowe Clack aspirin/Eliquis-okay to stop Plavix.   DM-2 CBGs stable with SSI Continue to hold metformin-May need to consider stopping given metabolic acidosis on presentation.  COPD Stable Bronchodilators  BMI: Estimated body mass index is 18.12 kg/m as calculated from the following:   Height as of this encounter: 5\' 1"  (1.549 m).   Weight as of this encounter: 43.5 kg.   Code status:   Code Status: DNR   DVT Prophylaxis: apixaban (ELIQUIS)  tablet 2.5 mg Start: 01/14/23 2200 apixaban (ELIQUIS) tablet 2.5 mg    Family Communication: None at bedside   Disposition Plan: Status is: Observation The patient will require care spanning > 2 midnights and should be moved to inpatient because: Severity of illness.   Planned Discharge Destination:Home health   Diet: Diet Order             Diet heart healthy/carb  modified Room service appropriate? Yes; Fluid consistency: Thin  Diet effective ____                     Antimicrobial agents: Anti-infectives (From admission, onward)    Start     Dose/Rate Route Frequency Ordered Stop   01/14/23 1745  cefTRIAXone (ROCEPHIN) 1 g in sodium chloride 0.9 % 100 mL IVPB        1 g 200 mL/hr over 30 Minutes Intravenous  Once 01/14/23 1741 01/14/23 1922   01/14/23 1745  azithromycin (ZITHROMAX) 500 mg in sodium chloride 0.9 % 250 mL IVPB        500 mg 250 mL/hr over 60 Minutes Intravenous  Once 01/14/23 1741 01/14/23 2315        MEDICATIONS: Scheduled Meds:   stroke: early stages of recovery book   Does not apply Once   apixaban  2.5 mg Oral BID   aspirin EC  81 mg Oral Daily   clopidogrel  75 mg Oral Daily   insulin aspart  0-15 Units Subcutaneous TID WC   insulin aspart  0-5 Units Subcutaneous QHS   metoprolol succinate  25 mg Oral Daily   pantoprazole  40 mg Oral Daily   rosuvastatin  20 mg Oral Daily   umeclidinium bromide  1 puff Inhalation QHS   Continuous Infusions:  sodium chloride 100 mL/hr at 01/15/23 0603   PRN Meds:.acetaminophen **OR** acetaminophen (TYLENOL) oral liquid 160 mg/5 mL **OR** acetaminophen, albuterol, hydrALAZINE, ondansetron (ZOFRAN) IV   I have personally reviewed following labs and imaging studies  LABORATORY DATA: CBC: Recent Labs  Lab 01/14/23 1120 01/14/23 1129 01/14/23 1412 01/15/23 0717  WBC 6.5  --   --  6.5  NEUTROABS 5.3  --   --   --   HGB 9.4* 10.5* 10.2* 10.2*  HCT 30.3* 31.0* 30.0* 31.5*  MCV 91.0  --   --  88.5  PLT 271  --   --  277    Basic Metabolic Panel: Recent Labs  Lab 01/14/23 1120 01/14/23 1129 01/14/23 1412 01/15/23 0717  NA 128* 128* 129* 131*  K 4.7 4.7 4.1 3.5  CL 98 105  --  101  CO2 13*  --   --  19*  GLUCOSE 192* 192*  --  202*  BUN 40* 47*  --  16  CREATININE 1.29* 1.20  --  0.95  CALCIUM 9.3  --   --  8.6*    GFR: Estimated Creatinine Clearance: 42  mL/min (by C-G formula based on SCr of 0.95 mg/dL).  Liver Function Tests: Recent Labs  Lab 01/14/23 1120 01/15/23 0717  AST 37 29  ALT 17 19  ALKPHOS 64 55  BILITOT 0.9 0.5  PROT 6.7 6.2*  ALBUMIN 3.6 3.3*   Recent Labs  Lab 01/14/23 1836  LIPASE 22   No results for input(s): "AMMONIA" in the last 168 hours.  Coagulation Profile: Recent Labs  Lab 01/14/23 1120  INR 1.2    Cardiac Enzymes: No results for input(s): "CKTOTAL", "CKMB", "CKMBINDEX", "TROPONINI" in the  last 168 hours.  BNP (last 3 results) No results for input(s): "PROBNP" in the last 8760 hours.  Lipid Profile: Recent Labs    01/15/23 0428  CHOL 127  HDL 28*  LDLCALC 63  TRIG 161*  CHOLHDL 4.5    Thyroid Function Tests: No results for input(s): "TSH", "T4TOTAL", "FREET4", "T3FREE", "THYROIDAB" in the last 72 hours.  Anemia Panel: No results for input(s): "VITAMINB12", "FOLATE", "FERRITIN", "TIBC", "IRON", "RETICCTPCT" in the last 72 hours.  Urine analysis:    Component Value Date/Time   COLORURINE YELLOW 01/14/2023 1059   APPEARANCEUR CLEAR 01/14/2023 1059   LABSPEC 1.017 01/14/2023 1059   PHURINE 5.0 01/14/2023 1059   GLUCOSEU NEGATIVE 01/14/2023 1059   HGBUR SMALL (A) 01/14/2023 1059   BILIRUBINUR NEGATIVE 01/14/2023 1059   KETONESUR 5 (A) 01/14/2023 1059   PROTEINUR 100 (A) 01/14/2023 1059   UROBILINOGEN 0.2 07/07/2015 1842   NITRITE NEGATIVE 01/14/2023 1059   LEUKOCYTESUR NEGATIVE 01/14/2023 1059    Sepsis Labs: Lactic Acid, Venous    Component Value Date/Time   LATICACIDVEN 1.6 01/10/2022 1854    MICROBIOLOGY: No results found for this or any previous visit (from the past 240 hour(s)).  RADIOLOGY STUDIES/RESULTS: MR BRAIN WO CONTRAST  Result Date: 01/14/2023 CLINICAL DATA:  Neuro deficit, acute, stroke suspected EXAM: MRI HEAD WITHOUT CONTRAST TECHNIQUE: Multiplanar, multiecho pulse sequences of the brain and surrounding structures were obtained without intravenous  contrast. COMPARISON:  Same day CT head.  MRI head Dec 26, 2021. FINDINGS: Brain: No acute infarction, hemorrhage, hydrocephalus, extra-axial collection or mass lesion. Mild for age scattered T2/FLAIR hyperintensities the white matter, nonspecific but compatible with chronic microvascular ischemic disease. Vascular: Abnormal left ICA flow void, compatible with known occlusion. Skull and upper cervical spine: Normal marrow signal. Sinuses/Orbits: Mild sinus mucosal thickening. No acute orbital findings. IMPRESSION: 1. No acute abnormality. 2. Chronically occluded left ICA. Electronically Signed   By: Feliberto Harts M.D.   On: 01/14/2023 18:06   CT ABDOMEN PELVIS WO CONTRAST  Result Date: 01/14/2023 CLINICAL DATA:  Lactic acidosis, back pain EXAM: CT ABDOMEN AND PELVIS WITHOUT CONTRAST TECHNIQUE: Multidetector CT imaging of the abdomen and pelvis was performed following the standard protocol without IV contrast. RADIATION DOSE REDUCTION: This exam was performed according to the departmental dose-optimization program which includes automated exposure control, adjustment of the mA and/or kV according to patient size and/or use of iterative reconstruction technique. COMPARISON:  Abdomen radiograph 01/14/2023, CT 12/20/2021 FINDINGS: Lower chest: Lung bases demonstrate small right-sided pleural effusion. Mild ground-glass density in the right lower lobe. Emphysema. Coronary vascular calcification Hepatobiliary: Distended gallbladder without calcified stone. No focal hepatic abnormality. Common bile duct is dilated, measuring up to 19 mm. No calcified stones are visualized. Suspicion of mild intra hepatic biliary dilatation. Pancreas: No inflammation.  Atrophic. Spleen: Normal in size without focal abnormality. Adrenals/Urinary Tract: Adrenal glands are within normal limits. Kidneys show no hydronephrosis. Punctate nonobstructing right kidney stones. The bladder is distended Stomach/Bowel: Stomach nonenlarged. No  dilated small bowel. Possible mild wall thickening at the hepatic flexure. Nonvisualized appendix. Vascular/Lymphatic: Advanced aortic atherosclerosis. No aneurysm. No suspicious lymph nodes. Reproductive: Prostate is unremarkable. Other: Negative for pelvic effusion or free air. Musculoskeletal: No acute or suspicious osseous abnormality. IMPRESSION: 1. Small right-sided pleural effusion. Mild ground-glass density in the right lower lobe could be due to mild pneumonia. 2. Distended gallbladder without calcified stone. Dilated common bile duct measuring up to 19 mm with suspicion of intra hepatic biliary dilatation. Suggest correlation with LFTs  and follow-up MRCP as indicated. 3. Possible mild wall thickening at the hepatic flexure of the colon, correlate for any history of diarrhea/colitis type symptoms 4. Punctate nonobstructing right kidney stones. 5. Aortic atherosclerosis. Aortic Atherosclerosis (ICD10-I70.0). Electronically Signed   By: Jasmine Pang M.D.   On: 01/14/2023 15:36   DG Abd Portable 1V  Result Date: 01/14/2023 CLINICAL DATA:  [id number] Pain [id number] EXAM: PORTABLE ABDOMEN - 1 VIEW COMPARISON:  CT 12/20/2021 FINDINGS: Effusion and airspace disease at the right lung base. Stomach and small bowel are nondilated. Moderate colonic fecal material without dilatation. Regional bones unremarkable. Left common iliac and right femoral vascular stents. IMPRESSION: Nonobstructive bowel gas pattern with moderate colonic fecal material. Electronically Signed   By: Corlis Leak M.D.   On: 01/14/2023 14:34   DG Chest Port 1 View  Result Date: 01/14/2023 CLINICAL DATA:  Pain. EXAM: PORTABLE CHEST 1 VIEW COMPARISON:  03/05/2022. FINDINGS: Rotated patient. No consolidation or pulmonary edema. Chronic scarring in the right lung base. Normal heart size. Stable mediastinal contours with postoperative changes of median sternotomy and CABG. No pleural effusion or pneumothorax. Visualized bones and upper abdomen are  unremarkable. IMPRESSION: No evidence of acute cardiopulmonary disease. Electronically Signed   By: Orvan Falconer M.D.   On: 01/14/2023 12:18   CT HEAD WO CONTRAST  Result Date: 01/14/2023 CLINICAL DATA:  Neuro deficit. Stroke suspected. Weakness and confusion. EXAM: CT HEAD WITHOUT CONTRAST TECHNIQUE: Contiguous axial images were obtained from the base of the skull through the vertex without intravenous contrast. RADIATION DOSE REDUCTION: This exam was performed according to the departmental dose-optimization program which includes automated exposure control, adjustment of the mA and/or kV according to patient size and/or use of iterative reconstruction technique. COMPARISON:  12/26/2021 FINDINGS: Brain: No evidence of acute infarction, hemorrhage, hydrocephalus, extra-axial collection or mass lesion/mass effect. There is mild low-attenuation within the subcortical and periventricular white matter compatible with chronic microvascular disease. Prominence of the sulci and ventricles compatible with brain atrophy. Vascular: No hyperdense vessel or unexpected calcification. Skull: Normal. Negative for fracture or focal lesion. Sinuses/Orbits: Postoperative changes involving the paranasal sinuses. Mucosal thickening is noted involving bilateral maxillary sinuses. No sinus fluid levels. Other: None IMPRESSION: 1. No acute intracranial abnormalities. 2. Chronic microvascular disease and brain atrophy. Electronically Signed   By: Signa Kell M.D.   On: 01/14/2023 12:10     LOS: 0 days   [name] Massed, MD  [facility]    To contact the attending provider between 7A-7P or the covering provider during after hours 7P-7A, please log into the web site [email or web address] and access using universal [place] password for that web site. If you do not have the password, please call the hospital operator.  01/15/2023, 10:30 AM

## 2023-01-15 NOTE — Progress Notes (Signed)
Initial Nutrition Assessment  DOCUMENTATION CODES:   Severe malnutrition in context of chronic illness, Underweight  INTERVENTION:  Multivitamin w/ minerals daily Ensure Enlive po BID, each supplement provides 350 kcal and 20 grams of protein. Liberalize to regular due to malnutrition  NUTRITION DIAGNOSIS:   Severe Malnutrition related to chronic illness as evidenced by severe muscle depletion, severe fat depletion.  GOAL:   Patient will meet greater than or equal to 90% of their needs  MONITOR:   PO intake, Supplement acceptance, Labs, I & O's  REASON FOR ASSESSMENT:   Consult Assessment of nutrition requirement/status  ASSESSMENT:   72 y.o. male presented to the ED with RLE pain, weakness, fecal matter and bedbugs. PMH includes CKD IIIa, GERD, COPD, PAD, HTN, and HLD. Pt admitted with metabolic acidosis and R side weakness.   Pt eating lunch at time of RD visit. RD observed <25% of lunch consumed. Pt reports that he cooks 3 hot dogs, 4 eggs, and 1 slice of toast.  Was unable to get any other nutrition information from pt. Unable to assess weight history as well. Per  EMR, appears how pt has had some weight loss but looks like current weight is copied from previous visit and unsure if accurate.   Pt meets criteria for malnutrition based on physical exam.  Medications reviewed and include: NovoLog SSI, Protonix Labs reviewed: Sodium 131, Potassium 3.5, BUN 16, Creatinine 0.95, Hgb A1c 8.2% CBG: 197-222 x 24 hours  NUTRITION - FOCUSED PHYSICAL EXAM:  Flowsheet Row Most Recent Value  Orbital Region Severe depletion  Upper Arm Region Severe depletion  Thoracic and Lumbar Region Severe depletion  Buccal Region Severe depletion  Temple Region Moderate depletion  Clavicle Bone Region Severe depletion  Clavicle and Acromion Bone Region Severe depletion  Scapular Bone Region Severe depletion  Dorsal Hand Severe depletion  Patellar Region Severe depletion  Anterior Thigh  Region Severe depletion  Posterior Calf Region Severe depletion  Edema (RD Assessment) None  Hair Reviewed  Eyes Reviewed  Mouth Reviewed  Skin Reviewed  Nails Reviewed   Diet Order:   Diet Order             Diet regular Room service appropriate? Yes with Assist; Fluid consistency: Thin  Diet effective now                   EDUCATION NEEDS:   Not appropriate for education at this time  Skin:  Skin Assessment: Reviewed RN Assessment  Last BM:  5/23  Height:   Ht Readings from Last 1 Encounters:  01/14/23 5\' 1"  (1.549 m)    Weight:   Wt Readings from Last 1 Encounters:  01/14/23 43.5 kg    Ideal Body Weight:  50.9 kg  BMI:  Body mass index is 18.12 kg/m.  Estimated Nutritional Needs:  Kcal:  1500-1700 Protein:  70-90 grams Fluid:  >/= 1.5 L   Kirby Crigler RD, LDN Clinical Dietitian See [facility] for contact information.

## 2023-01-16 ENCOUNTER — Encounter (HOSPITAL_COMMUNITY): Payer: Self-pay | Admitting: Internal Medicine

## 2023-01-16 DIAGNOSIS — E8729 Other acidosis: Secondary | ICD-10-CM | POA: Diagnosis not present

## 2023-01-16 DIAGNOSIS — J449 Chronic obstructive pulmonary disease, unspecified: Secondary | ICD-10-CM | POA: Diagnosis not present

## 2023-01-16 DIAGNOSIS — I10 Essential (primary) hypertension: Secondary | ICD-10-CM | POA: Diagnosis not present

## 2023-01-16 DIAGNOSIS — Z66 Do not resuscitate: Secondary | ICD-10-CM | POA: Diagnosis not present

## 2023-01-16 LAB — GLUCOSE, CAPILLARY
Glucose-Capillary: 260 mg/dL — ABNORMAL HIGH (ref 70–99)
Glucose-Capillary: 327 mg/dL — ABNORMAL HIGH (ref 70–99)
Glucose-Capillary: 289 mg/dL — ABNORMAL HIGH (ref 70–99)
Glucose-Capillary: 108 mg/dL — ABNORMAL HIGH (ref 70–99)

## 2023-01-16 LAB — CBC
HCT: 30.1 % — ABNORMAL LOW (ref 39.0–52.0)
Hemoglobin: 9.9 g/dL — ABNORMAL LOW (ref 13.0–17.0)
MCH: 28.4 pg (ref 26.0–34.0)
MCHC: 32.9 g/dL (ref 30.0–36.0)
MCV: 86.5 fL (ref 80.0–100.0)
Platelets: 287 10*3/uL (ref 150–400)
RBC: 3.48 MIL/uL — ABNORMAL LOW (ref 4.22–5.81)
RDW: 14.3 % (ref 11.5–15.5)
WBC: 8.4 10*3/uL (ref 4.0–10.5)
nRBC: 0 % (ref 0.0–0.2)

## 2023-01-16 LAB — COMPREHENSIVE METABOLIC PANEL
ALT: 22 U/L (ref 0–44)
AST: 32 U/L (ref 15–41)
Albumin: 3.3 g/dL — ABNORMAL LOW (ref 3.5–5.0)
Alkaline Phosphatase: 73 U/L (ref 38–126)
Anion gap: 11 (ref 5–15)
BUN: 19 mg/dL (ref 8–23)
CO2: 19 mmol/L — ABNORMAL LOW (ref 22–32)
Calcium: 8.9 mg/dL (ref 8.9–10.3)
Chloride: 101 mmol/L (ref 98–111)
Creatinine, Ser: 0.98 mg/dL (ref 0.61–1.24)
GFR, Estimated: >60 mL/min (ref >60)
Glucose, Bld: 302 mg/dL — ABNORMAL HIGH (ref 70–99)
Potassium: 3.5 mmol/L (ref 3.5–5.1)
Sodium: 131 mmol/L — ABNORMAL LOW (ref 135–145)
Total Bilirubin: 0.6 mg/dL (ref 0.3–1.2)
Total Protein: 6.2 g/dL — ABNORMAL LOW (ref 6.5–8.1)

## 2023-01-16 LAB — APTT: aPTT: 44 seconds — ABNORMAL HIGH (ref 24–36)

## 2023-01-16 LAB — HEPARIN LEVEL (UNFRACTIONATED): Heparin Unfractionated: >1.1 IU/mL — ABNORMAL HIGH (ref 0.30–0.70)

## 2023-01-16 MED ORDER — HEPARIN (PORCINE) 25000 UT/250ML-% IV SOLN
900.0000 [IU]/h | INTRAVENOUS | Status: AC
  Administered 2023-01-16: 750 [IU]/h via INTRAVENOUS
  Administered 2023-01-18: 900 [IU]/h via INTRAVENOUS
  Filled 2023-01-16 (×2): qty 250

## 2023-01-16 MED ORDER — GABAPENTIN 300 MG PO CAPS
300.0000 mg | ORAL_CAPSULE | Freq: Once | ORAL | Status: AC
  Administered 2023-01-16: 300 mg via ORAL
  Filled 2023-01-16: qty 1

## 2023-01-16 MED ORDER — METOPROLOL TARTRATE 5 MG/5ML IV SOLN
5.0000 mg | Freq: Once | INTRAVENOUS | Status: AC
  Administered 2023-01-16: 5 mg via INTRAVENOUS
  Filled 2023-01-16: qty 5

## 2023-01-16 MED ORDER — INSULIN GLARGINE-YFGN 100 UNIT/ML ~~LOC~~ SOLN
10.0000 [IU] | Freq: Every day | SUBCUTANEOUS | Status: DC
  Administered 2023-01-16 – 2023-01-21 (×6): 10 [IU] via SUBCUTANEOUS
  Filled 2023-01-16 (×8): qty 0.1

## 2023-01-16 MED ORDER — HEPARIN (PORCINE) 25000 UT/250ML-% IV SOLN
650.0000 [IU]/h | INTRAVENOUS | Status: DC
  Administered 2023-01-16: 650 [IU]/h via INTRAVENOUS
  Filled 2023-01-16: qty 250

## 2023-01-16 MED ORDER — GABAPENTIN 100 MG PO CAPS
200.0000 mg | ORAL_CAPSULE | Freq: Two times a day (BID) | ORAL | Status: DC
  Administered 2023-01-16 – 2023-01-21 (×10): 200 mg via ORAL
  Filled 2023-01-16 (×11): qty 2

## 2023-01-16 NOTE — Progress Notes (Addendum)
ANTICOAGULATION CONSULT NOTE - Initial Consult  Pharmacy Consult for heparin Indication: atrial fibrillation in setting of possible ERCP this week  Allergies  Allergen Reactions   Actos [Pioglitazone] Shortness Of Breath    Leg swelling    Lipitor [Atorvastatin] Other (See Comments)    Leg pain    Zestril [Lisinopril] Cough    Patient Measurements: Height: 5\' 1"  (154.9 cm) Weight: 43.5 kg (95 lb 14.4 oz) IBW/kg (Calculated) : 52.3 kg Heparin dosing weight: 43.5 kg  Vital Signs: Temp: 98.2 F (36.8 C) (05/25 0700) Temp Source: Oral (05/25 0700) BP: 158/74 (05/25 0700) Pulse Rate: 98 (05/25 0700)  Labs: Recent Labs    01/14/23 1120 01/14/23 1129 01/14/23 1344 01/14/23 1412 01/15/23 0717 01/16/23 0708  HGB 9.4* 10.5*  --  10.2* 10.2* 9.9*  HCT 30.3* 31.0*  --  30.0* 31.5* 30.1*  PLT 271  --   --   --  277 287  APTT 31  --   --   --   --   --   LABPROT 15.4*  --   --   --   --   --   INR 1.2  --   --   --   --   --   CREATININE 1.29* 1.20  --   --  0.95 0.98  TROPONINIHS 16  --  16  --   --   --    Estimated Creatinine Clearance: 40.7 mL/min (by C-G formula based on SCr of 0.98 mg/dL).  Medical History: Past Medical History:  Diagnosis Date   Carotid artery disease (HCC)    L-ICA 100%, mod R-ICA dz   COPD (chronic obstructive pulmonary disease) (HCC)    Coronary artery disease    s/p CABG February 2010 by Dr. Andrey Spearman   Diabetes [facility])    Hyperlipidemia    Hypertension    Peripheral arterial disease (HCC)    post left common iliac and right SFA stenting remotely   Tobacco abuse        Medications:  Infusions:   sodium chloride 100 mL/hr at 01/15/23 0603   Assessment: 73 YO M admitted with PMH of atrial fibrillation on Eliquis prior to admission. Pharmacy consulted to transition to heparin in setting to possible ERCP. Last dose of apixaban 5/24 at 2100. Will not bolus in setting of recent apixaban use and monitor aPTT until correlating with  heparin level. Hgb 9.9 stable and plt 287.  Goal of Therapy:  Heparin level 0.3-0.7 units/ml aPTT 66-102 seconds Monitor platelets by anticoagulation protocol: Yes   Plan:  Start heparin 650 units/hr. Check 8 aPTT and heparin level.  Daily CBC, aPTT, and heparin level. Monitor for signs/symptoms of bleeding.   Georga Hacking 01/16/2023,8:43 AM

## 2023-01-16 NOTE — Evaluation (Signed)
Speech Language Pathology Evaluation Patient Details Name: Reginald Ayers MRN: [id number] DOB: 23-Jun-1951 Today's Date: 01/16/2023 Time: 4098-1191 SLP Time Calculation (min) (ACUTE ONLY): 42 min  Problem List:  Patient Active Problem List   Diagnosis Date Noted   Increased anion gap metabolic acidosis 01/14/2023   DNR (do not resuscitate) 01/14/2023   Pain due to onychomycosis of toenails of both feet 09/14/2022   Medication monitoring encounter 01/29/2022   Screening of cancer 01/29/2022   Cerebral embolism with cerebral infarction 12/22/2021   Pressure injury of skin 12/21/2021   Acute respiratory failure with hypoxia (HCC) 12/20/2021   Severe protein-energy malnutrition (HCC) 12/20/2021   Acidosis, metabolic 12/20/2021   AKI (acute kidney injury) (HCC) 09/27/2021   Right sided weakness 09/27/2021   Stage 3a chronic kidney disease (HCC) 08/14/2019   HCAP (healthcare-associated pneumonia) 08/12/2017   GERD (gastroesophageal reflux disease) 07/30/2015   Tobacco abuse 12/06/2014   COPD (chronic obstructive pulmonary disease) (HCC)    Hx of CABG Feb 2010 08/21/2013   Peripheral arterial disease (HCC) 08/21/2013   Carotid artery disease (HCC) 08/21/2013   Diabetes (HCC) 08/21/2013   Essential hypertension 08/21/2013   Hyperlipidemia 08/21/2013   Past Medical History:  Past Medical History:  Diagnosis Date   Carotid artery disease (HCC)    L-ICA 100%, mod R-ICA dz   COPD (chronic obstructive pulmonary disease) (HCC)    Coronary artery disease    s/p CABG February 2010 by Dr. Andrey Spearman   [facility])    Hyperlipidemia    Hypertension    Peripheral arterial disease (HCC)    post left common iliac and right SFA stenting remotely   Tobacco abuse        Past Surgical History:  Past Surgical History:  Procedure Laterality Date   ABDOMINAL AORTOGRAM N/A 04/20/2017   Procedure: ABDOMINAL AORTOGRAM;  Surgeon: Nada Libman, MD;  Location: [facility];   Service: Cardiovascular;  Laterality: N/A;   ABDOMINAL AORTOGRAM W/LOWER EXTREMITY Right 12/08/2016   Procedure: Abdominal Aortogram w/Lower Extremity;  Surgeon: Nada Libman, MD;  Location: [facility];  Service: Cardiovascular;  Laterality: Right;   ABDOMINAL AORTOGRAM W/LOWER EXTREMITY N/A 09/07/2017   Procedure: ABDOMINAL AORTOGRAM W/LOWER EXTREMITY;  Surgeon: Nada Libman, MD;  Location: [facility];  Service: Cardiovascular;  Laterality: N/A;   ABDOMINAL AORTOGRAM W/LOWER EXTREMITY N/A 03/31/2022   Procedure: ABDOMINAL AORTOGRAM W/LOWER EXTREMITY;  Surgeon: Nada Libman, MD;  Location: [facility];  Service: Cardiovascular;  Laterality: N/A;   ABDOMINAL AORTOGRAM W/LOWER EXTREMITY Right 09/29/2022   Procedure: ABDOMINAL AORTOGRAM W/LOWER EXTREMITY;  Surgeon: Nada Libman, MD;  Location: [facility];  Service: Cardiovascular;  Laterality: Right;   CARDIAC CATHETERIZATION  2010   CAROTID ANGIOGRAM  2014   CORONARY ARTERY BYPASS GRAFT  2010   LIMA-LAD, Lrad-OM1, SVG-RI, SVG-AM-dRCA   DOPPLER ECHOCARDIOGRAPHY  2010   LOWER EXTREMITY ANGIOGRAM  11/2006   left common femoral endarterectomy and patch angioplasty:The mid right SFA was angioplastied with a 4 x 8   LOWER EXTREMITY ANGIOGRAM  2003   left common iliac artery stenting by Dr. Erlene Quan   LOWER EXTREMITY ANGIOGRAPHY Right 04/20/2017   Procedure: Lower Extremity Angiography;  Surgeon: Nada Libman, MD;  Location: [facility] INVASIVE CV LAB;  Service: Cardiovascular;  Laterality: Right;   NM MYOVIEW LTD  2015   PERIPHERAL VASCULAR ATHERECTOMY Right 12/08/2016   Procedure: Peripheral Vascular Atherectomy;  Surgeon: Nada Libman, MD;  [facility]: [facility];  Service: Cardiovascular;  Laterality: Right;   PERIPHERAL VASCULAR BALLOON ANGIOPLASTY Right 12/08/2016   Procedure: Peripheral Vascular Balloon Angioplasty;  Surgeon: Nada Libman, MD;  [facility]: [facility];  Service: Cardiovascular;   Laterality: Right;   PERIPHERAL VASCULAR BALLOON ANGIOPLASTY Right 04/20/2017   Procedure: PERIPHERAL VASCULAR BALLOON ANGIOPLASTY;  Surgeon: Nada Libman, MD;  [facility]: [facility];  Service: Cardiovascular;  Laterality: Right;  SFA   PERIPHERAL VASCULAR BALLOON ANGIOPLASTY  03/31/2022   Procedure: PERIPHERAL VASCULAR BALLOON ANGIOPLASTY;  Surgeon: Nada Libman, MD;  [facility]: [facility];  Service: Cardiovascular;;  Right PT   PERIPHERAL VASCULAR BALLOON ANGIOPLASTY  09/29/2022   Procedure: PERIPHERAL VASCULAR BALLOON ANGIOPLASTY;  Surgeon: Nada Libman, MD;  [facility]: [facility];  Service: Cardiovascular;;  Rt PT and Peroneal   PERIPHERAL VASCULAR CATHETERIZATION N/A 09/22/2016   Procedure: Abdominal Aortogram w/ bilateral Lower Extremity Runoff;  Surgeon: Nada Libman, MD;  [facility]: [facility];  Service: Cardiovascular;  Laterality: N/A;   PERIPHERAL VASCULAR CATHETERIZATION Right 09/22/2016   Procedure: Peripheral Vascular Intervention;  Surgeon: Nada Libman, MD;  [facility]: [facility];  Service: Cardiovascular;  Laterality: Right;   PERIPHERAL VASCULAR INTERVENTION  09/29/2022   Procedure: PERIPHERAL VASCULAR INTERVENTION;  Surgeon: Nada Libman, MD;  [facility]: [facility];  Service: Cardiovascular;;  Rt Popliteal   TRANSCAROTID ARTERY REVASCULARIZATION  Right 12/26/2021   Procedure: Right Transcarotid Artery Revascularization;  Surgeon: Nada Libman, MD;  Location: [facility] OR;  Service: Vascular;  Laterality: Right;   ULTRASOUND GUIDANCE FOR VASCULAR ACCESS Left 12/26/2021   Procedure: ULTRASOUND GUIDANCE FOR VASCULAR ACCESS, LEFT FEMORAL VEIN;  Surgeon: Nada Libman, MD;  Location: [facility] OR;  Service: Vascular;  Laterality: Left;   HPI:  /23 for weakness and pain in his RLE, pt found in fecal matter and bedbugs. MRI of brain with no acute abnormality, chronically occluded left ICA. CT of abdomen/pelvis with mild groundglass denisty  in the RLL, distended gallbladder without calcified stone, dilated CBD with suspicion for intrahepatic dilatation. PMHx: COPD, CAD s/p CABG, DM, HTN, HLD, PAD s/p stent, tobacco dependence.  Brain MRI negative for acute event.   Assessment / Plan / Recommendation Clinical Impression  [facility] Mental Status Exam completed with pt scoring 19/30- per study authors indicative of severe impairment.  Strengths noted in areas of orientation and clock drawing/visual spatial skills.  Mild difficulties noted in areas of math-  problem solving but he benefited from repetition and breakdown of important items.    Pt has memory impairments with both storage and retrieval of information. He required verbal cues of various type to correctly recall 3/5 items - did not recall 2/5 with multiple choice.  Note pt's MRI brain negative - therefore suspect he may have some chronic cognitive deficits. Advised he have his friend help to assure he is taking his meds and managing his bils correctly, to which he advised he would.  Minimal dysarthria noted but with slowing rate of speech, pt intelligible and his language is intact.  Thanks for this consult.    SLP Assessment  SLP Recommendation/Assessment: Patient does not need any further Speech Lanaguage Pathology Services SLP Visit Diagnosis: Cognitive communication deficit (R41.841)    Recommendations for follow up therapy are one component of a multi-disciplinary discharge planning process, led by the attending physician.  Recommendations may be updated based on patient status, additional functional  criteria and insurance authorization.    Follow Up Recommendations  No SLP follow up    Assistance Recommended at Discharge  None  Functional Status Assessment Patient has not had a recent decline in their functional status  Frequency and Duration   N/a        SLP Evaluation Cognition  Overall Cognitive Status: No family/caregiver present to determine baseline  cognitive functioning Arousal/Alertness: Awake/alert Orientation Level: Oriented X4 Year: 2024 Month: May Day of Week: Correct Attention: Sustained Sustained Attention: Appears intact Memory: Impaired Memory Impairment: Storage deficit;Retrieval deficit Awareness: Impaired       Comprehension  Auditory Comprehension Overall Auditory Comprehension: Appears within functional limits for tasks assessed Yes/No Questions: Not tested Commands: Within Functional Limits Conversation: Complex Interfering Components: Hearing;Working memory EffectiveTechniques: Designer, industrial/product: Within [employer] Reading Comprehension Reading Status: Not tested    Expression Expression Primary Mode of Expression: Verbal Verbal Expression Overall Verbal Expression: Appears within functional limits for tasks assessed Initiation: No impairment Repetition: No impairment Naming: Not tested Written Expression Dominant Hand: Right Written Expression: Not tested (pt with pain when moving right arm)   Oral / Motor  Motor Speech Overall Motor Speech: Impaired Phonation: Other (comment) (strained) Resonance: Within functional limits Articulation: Impaired Level of Impairment: Word Intelligibility: Intelligibility reduced Word: 75-100% accurate Phrase: 75-100% accurate Sentence: 50-74% accurate Motor Planning: Witnin functional limits Interfering Components: Premorbid status;Hearing loss;Inadequate dentition           Rolena Infante, MS [facility] SLP Acute Rehab Services Office [phone]  [name] 01/16/2023, 11:16 AM

## 2023-01-16 NOTE — Plan of Care (Signed)

## 2023-01-16 NOTE — Consult Note (Signed)
Reason for Consult: Abnormal MRCP Referring Physician: Hospital team  VAUGHAN MOWAT is an 72 y.o. male.  HPI: Patient seen and examined in both his hospital computer chart and our office computer chart was reviewed and at home earlier in the week he had 5 days of pain and diarrhea without any blood but is doing better now from a GI standpoint and was not aware he had gallstones and he did have a colonoscopy in 2009 but no other previous GI workup and usually has no GI complaints and his family history is negative from a GI standpoint and we answered all of his questions and case discussed with the hospital team  Past Medical History:  Diagnosis Date   Carotid artery disease (HCC)    L-ICA 100%, mod R-ICA dz   COPD (chronic obstructive pulmonary disease) (HCC)    Coronary artery disease    s/p CABG February 2010 by Dr. Andrey Spearman   Diabetes [facility])    Hyperlipidemia    Hypertension    Peripheral arterial disease (HCC)    post left common iliac and right SFA stenting remotely   Tobacco abuse         Past Surgical History:  Procedure Laterality Date   ABDOMINAL AORTOGRAM N/A 04/20/2017   Procedure: ABDOMINAL AORTOGRAM;  Surgeon: Nada Libman, MD;  [facility];  Service: Cardiovascular;  Laterality: N/A;   ABDOMINAL AORTOGRAM W/LOWER EXTREMITY Right 12/08/2016   Procedure: Abdominal Aortogram w/Lower Extremity;  Surgeon: Nada Libman, MD;  [facility];  Service: Cardiovascular;  Laterality: Right;   ABDOMINAL AORTOGRAM W/LOWER EXTREMITY N/A 09/07/2017   Procedure: ABDOMINAL AORTOGRAM W/LOWER EXTREMITY;  Surgeon: Nada Libman, MD;  [facility];  Service: Cardiovascular;  Laterality: N/A;   ABDOMINAL AORTOGRAM W/LOWER EXTREMITY N/A 03/31/2022   Procedure: ABDOMINAL AORTOGRAM W/LOWER EXTREMITY;  Surgeon: Nada Libman, MD;  [facility];  Service: Cardiovascular;  Laterality: N/A;   ABDOMINAL AORTOGRAM W/LOWER  EXTREMITY Right 09/29/2022   Procedure: ABDOMINAL AORTOGRAM W/LOWER EXTREMITY;  Surgeon: Nada Libman, MD;  [facility];  Service: Cardiovascular;  Laterality: Right;   CARDIAC CATHETERIZATION  2010   CAROTID ANGIOGRAM  2014   CORONARY ARTERY BYPASS GRAFT  2010   LIMA-LAD, Lrad-OM1, SVG-RI, SVG-AM-dRCA   DOPPLER ECHOCARDIOGRAPHY  2010   LOWER EXTREMITY ANGIOGRAM  11/2006   left common femoral endarterectomy and patch angioplasty:The mid right SFA was angioplastied with a 4 x 8   LOWER EXTREMITY ANGIOGRAM  2003   left common iliac artery stenting by Dr. Erlene Quan   LOWER EXTREMITY ANGIOGRAPHY Right 04/20/2017   Procedure: Lower Extremity Angiography;  Surgeon: Nada Libman, MD;  Location: [facility] INVAS[facility] CV LAB;  Service: Cardiovascular;  Laterality: Right;   NM MYOVIEW LTD  2015   PERIPHERAL VASCULAR ATHERECTOMY Right 12/08/2016   Procedure: Peripheral Vascular Atherectomy;  Surgeon: Nada Libman, MD;  [facility];  Service: Cardiovascular;  Laterality: Right;   PERIPHERAL VASCULAR BALLOON ANGIOPLASTY Right 12/08/2016   Procedure: Peripheral Vascular Balloon Angioplasty;  Surgeon: Nada Libman, MD;  [facility];  Service: Cardiovascular;  Laterality: Right;   PERIPHERAL VASCULAR BALLOON ANGIOPLASTY Right 04/20/2017   Procedure: PERIPHERAL VASCULAR BALLOON ANGIOPLASTY;  Surgeon: Nada Libman, MD;  [facility];  Service: Cardiovascular;  Laterality: Right;  SFA   PERIPHERAL VASCULAR BALLOON ANGIOPLASTY  03/31/2022   Procedure: PERIPHERAL VASCULAR BALLOON  ANGIOPLASTY;  Surgeon: Nada Libman, MD;  [facility];  Service: Cardiovascular;;  Right PT   PERIPHERAL VASCULAR BALLOON ANGIOPLASTY  09/29/2022   Procedure: PERIPHERAL VASCULAR BALLOON ANGIOPLASTY;  Surgeon: Nada Libman, MD;  [facility];  Service: Cardiovascular;;  Rt PT and Peroneal   PERIPHERAL VASCULAR CATHETERIZATION N/A  09/22/2016   Procedure: Abdominal Aortogram w/ bilateral Lower Extremity Runoff;  Surgeon: Nada Libman, MD;  [facility];  Service: Cardiovascular;  Laterality: N/A;   PERIPHERAL VASCULAR CATHETERIZATION Right 09/22/2016   Procedure: Peripheral Vascular Intervention;  Surgeon: Nada Libman, MD;  [facility];  Service: Cardiovascular;  Laterality: Right;   PERIPHERAL VASCULAR INTERVENTION  09/29/2022   Procedure: PERIPHERAL VASCULAR INTERVENTION;  Surgeon: Nada Libman, MD;  [facility];  Service: Cardiovascular;;  Rt Popliteal   TRANSCAROTID ARTERY REVASCULARIZATION  Right 12/26/2021   Procedure: Right Transcarotid Artery Revascularization;  Surgeon: Nada Libman, MD;  Location: [facility] OR;  Service: Vascular;  Laterality: Right;   ULTRASOUND GUIDANCE FOR VASCULAR ACCESS Left 12/26/2021   Procedure: ULTRASOUND GUIDANCE FOR VASCULAR ACCESS, LEFT FEMORAL VEIN;  Surgeon: Nada Libman, MD;  [facility];  Service: Vascular;  Laterality: Left;    Family History  Problem Relation Age of Onset   Heart attack Mother    CVA Mother    Heart disease Mother    Diabetes Sister    Hypertension Sister     Social History:  reports that he quit smoking about 6 years ago. His smoking use included cigarettes. His smokeless tobacco use includes chew. He reports that he does not drink alcohol and does not use drugs.  Allergies:  Allergies  Allergen Reactions   Actos [Pioglitazone] Shortness Of Breath    Leg swelling    Lipitor [Atorvastatin] Other (See Comments)    Leg pain    Zestril [Lisinopril] Cough    Medications: I have reviewed the patient's current medications.  Results for orders placed or performed during the hospital encounter of 01/14/23 (from the past 48 hour(s))  Ethanol     Status: None   Collection Time: 01/14/23 11:20 AM  Result Value Ref Range   Alcohol, Ethyl (B) <10 <10 mg/dL    Comment: (NOTE) Lowest detectable  limit for serum alcohol is 10 mg/dL.  For medical purposes only. Performed at [facility], [address] [place], [place] [address]   Protime-INR     Status: Abnormal   Collection Time: 01/14/23 11:20 AM  Result Value Ref Range   Prothrombin Time 15.4 (H) 11.4 - 15.2 seconds   INR 1.2 0.8 - 1.2    Comment: (NOTE) INR goal varies based on device and disease states. Performed at [facility], [address], [place], [place] [address]   APTT     Status: None   Collection Time: 01/14/23 11:20 AM  Result Value Ref Range   aPTT 31 24 - 36 seconds    Comment: Performed at [facility], [address], [place], [place] [address]  CBC     Status: Abnormal   Collection Time: 01/14/23 11:20 AM  Result Value Ref Range   WBC 6.5 4.0 - 10.5 K/uL   RBC 3.33 (L) 4.22 - 5.81 MIL/uL   Hemoglobin 9.4 (L) 13.0 - 17.0 g/dL   HCT 91.4 (L) 78.2 - 95.6 %   MCV 91.0 80.0 - 100.0 fL  MCH 28.2 26.0 - 34.0 pg   MCHC 31.0 30.0 - 36.0 g/dL   RDW 16.1 09.6 - 04.5 %   Platelets 271 150 - 400 K/uL   nRBC 0.0 0.0 - 0.2 %    Comment: Performed at [facility], [address], [place], [place] [address]  Differential     Status: Abnormal   Collection Time: 01/14/23 11:20 AM  Result Value Ref Range   Neutrophils Relative % 81 %   Neutro Abs 5.3 1.7 - 7.7 K/uL   Lymphocytes Relative 7 %   Lymphs Abs 0.5 (L) 0.7 - 4.0 K/uL   Monocytes Relative 10 %   Monocytes Absolute 0.6 0.1 - 1.0 K/uL   Eosinophils Relative 1 %   Eosinophils Absolute 0.0 0.0 - 0.5 K/uL   Basophils Relative 0 %   Basophils Absolute 0.0 0.0 - 0.1 K/uL   Immature Granulocytes 1 %   Abs Immature Granulocytes 0.08 (H) 0.00 - 0.07 K/uL    Comment: Performed at [facility], [address], [place], [place] [address]  Comprehensive metabolic panel     Status: Abnormal   Collection Time: 01/14/23 11:20 AM  Result Value Ref Range   Sodium 128 (L) 135 - 145 mmol/L   Potassium 4.7 3.5 - 5.1 mmol/L     Comment: HEMOLYSIS AT THIS LEVEL MAY AFFECT RESULT   Chloride 98 98 - 111 mmol/L   CO2 13 (L) 22 - 32 mmol/L   Glucose, Bld 192 (H) 70 - 99 mg/dL    Comment: Glucose reference range applies only to samples taken after fasting for at least 8 hours.   BUN 40 (H) 8 - 23 mg/dL   Creatinine, Ser 8.29 (H) 0.61 - 1.24 mg/dL   Calcium 9.3 8.9 - 56.2 mg/dL   Total Protein 6.7 6.5 - 8.1 g/dL   Albumin 3.6 3.5 - 5.0 g/dL   AST 37 15 - 41 U/L    Comment: HEMOLYSIS AT THIS LEVEL MAY AFFECT RESULT   ALT 17 0 - 44 U/L    Comment: HEMOLYSIS AT THIS LEVEL MAY AFFECT RESULT   Alkaline Phosphatase 64 38 - 126 U/L   Total Bilirubin 0.9 0.3 - 1.2 mg/dL    Comment: HEMOLYSIS AT THIS LEVEL MAY AFFECT RESULT   GFR, Estimated 58 (L) >60 mL/min    Comment: (NOTE) Calculated using the CKD-EPI Creatinine Equation (2021)    Anion gap 17 (H) 5 - 15    Comment: Performed at [facility], [address], [place], [place] [address]  Troponin I (High Sensitivity)     Status: None   Collection Time: 01/14/23 11:20 AM  Result Value Ref Range   Troponin I (High Sensitivity) 16 <18 ng/L    Comment: (NOTE) Elevated high sensitivity troponin I (hsTnI) values and significant  changes across serial measurements may suggest ACS but many other  chronic and acute conditions are known to elevate hsTnI results.  Refer to the "Links" section for chest pain algorithms and additional  guidance. Performed at [facility], [address], [place], [place] [address]   I-stat chem 8, ED     Status: Abnormal   Collection Time: 01/14/23 11:29 AM  Result Value Ref Range   Sodium 128 (L) 135 - 145 mmol/L   Potassium 4.7 3.5 - 5.1 mmol/L   Chloride 105 98 - 111 mmol/L   BUN 47 (H) 8 - 23 mg/dL   Creatinine, Ser 9.62 0.61 -  1.24 mg/dL   Glucose, Bld 161 (H) 70 - 99 mg/dL    Comment: Glucose reference range applies only to samples taken after fasting for at least 8 hours.   Calcium, Ion 1.09 (L) 1.15 - 1.40 mmol/L    TCO2 15 (L) 22 - 32 mmol/L   Hemoglobin 10.5 (L) 13.0 - 17.0 g/dL   HCT 09.6 (L) 04.5 - 40.9 %  Troponin I (High Sensitivity)     Status: None   Collection Time: 01/14/23  1:44 PM  Result Value Ref Range   Troponin I (High Sensitivity) 16 <18 ng/L    Comment: (NOTE) Elevated high sensitivity troponin I (hsTnI) values and significant  changes across serial measurements may suggest ACS but many other  chronic and acute conditions are known to elevate hsTnI results.  Refer to the "Links" section for chest pain algorithms and additional  guidance. Performed at [facility], [address] [place], [place] [address]   I-Stat venous blood gas, ED     Status: Abnormal   Collection Time: 01/14/23  2:12 PM  Result Value Ref Range   pH, Ven 7.339 7.25 - 7.43   pCO2, Ven 29.4 (L) 44 - 60 mmHg   pO2, Ven 48 (H) 32 - 45 mmHg   Bicarbonate 15.8 (L) 20.0 - 28.0 mmol/L   TCO2 17 (L) 22 - 32 mmol/L   O2 Saturation 82 %   Acid-base deficit 9.0 (H) 0.0 - 2.0 mmol/L   Sodium 129 (L) 135 - 145 mmol/L   Potassium 4.1 3.5 - 5.1 mmol/L   Calcium, Ion 1.18 1.15 - 1.40 mmol/L   HCT 30.0 (L) 39.0 - 52.0 %   Hemoglobin 10.2 (L) 13.0 - 17.0 g/dL   Sample type VENOUS   Hemoglobin A1c     Status: Abnormal   Collection Time: 01/14/23  6:36 PM  Result Value Ref Range   Hgb A1c MFr Bld 8.2 (H) 4.8 - 5.6 %    Comment: (NOTE)         Prediabetes: 5.7 - 6.4         Diabetes: >6.4         Glycemic control for adults with diabetes: <7.0    Mean Plasma Glucose 189 mg/dL    Comment: (NOTE) Performed At: [facility] [address] [place], [place] [id number] Jolene Schimke MD [id number]   Lipase, blood     Status: None   Collection Time: 01/14/23  6:36 PM  Result Value Ref Range   Lipase 22 11 - 51 U/L    Comment: Performed at [facility], [address] [place], [place] [address]  Glucose, capillary     Status: Abnormal   Collection Time: 01/14/23 10:28 PM  Result Value Ref  Range   Glucose-Capillary 222 (H) 70 - 99 mg/dL    Comment: Glucose reference range applies only to samples taken after fasting for at least 8 hours.  Lipid panel     Status: Abnormal   Collection Time: 01/15/23  4:28 AM  Result Value Ref Range   Cholesterol 127 0 - 200 mg/dL   Triglycerides 132 (H) <150 mg/dL   HDL 28 (L) >44 mg/dL   Total CHOL/HDL Ratio 4.5 RATIO   VLDL 36 0 - 40 mg/dL   LDL Cholesterol 63 0 - 99 mg/dL    Comment:        Total Cholesterol/HDL:CHD Risk Coronary Heart Disease Risk Table  Men   Women  1/2 Average Risk   3.4   3.3  Average Risk       5.0   4.4  2 X Average Risk   9.6   7.1  3 X Average Risk  23.4   11.0        Use the calculated Patient Ratio above and the CHD Risk Table to determine the patient's CHD Risk.        ATP III CLASSIFICATION (LDL):  <100     mg/dL   Optimal  161-096  mg/dL   Near or Above                    Optimal  130-159  mg/dL   Borderline  045-409  mg/dL   High  >811     mg/dL   Very High Performed at [facility], [address], [place], [place] [address]   CBC     Status: Abnormal   Collection Time: 01/15/23  7:17 AM  Result Value Ref Range   WBC 6.5 4.0 - 10.5 K/uL   RBC 3.56 (L) 4.22 - 5.81 MIL/uL   Hemoglobin 10.2 (L) 13.0 - 17.0 g/dL   HCT 29.5 (L) 62.1 - 30.8 %   MCV 88.5 80.0 - 100.0 fL   MCH 28.7 26.0 - 34.0 pg   MCHC 32.4 30.0 - 36.0 g/dL   RDW 65.7 84.6 - 96.2 %   Platelets 277 150 - 400 K/uL   nRBC 0.0 0.0 - 0.2 %    Comment: Performed at [facility], [address], [place], [place] [address]  Comprehensive metabolic panel     Status: Abnormal   Collection Time: 01/15/23  7:17 AM  Result Value Ref Range   Sodium 131 (L) 135 - 145 mmol/L   Potassium 3.5 3.5 - 5.1 mmol/L   Chloride 101 98 - 111 mmol/L   CO2 19 (L) 22 - 32 mmol/L   Glucose, Bld 202 (H) 70 - 99 mg/dL    Comment: Glucose reference range applies only to samples taken after fasting for at least 8 hours.    BUN 16 8 - 23 mg/dL   Creatinine, Ser 1.32 0.61 - 1.24 mg/dL   Calcium 8.6 (L) 8.9 - 10.3 mg/dL   Total Protein 6.2 (L) 6.5 - 8.1 g/dL   Albumin 3.3 (L) 3.5 - 5.0 g/dL   AST 29 15 - 41 U/L   ALT 19 0 - 44 U/L   Alkaline Phosphatase 55 38 - 126 U/L   Total Bilirubin 0.5 0.3 - 1.2 mg/dL   GFR, Estimated >44 >01 mL/min    Comment: (NOTE) Calculated using the CKD-EPI Creatinine Equation (2021)    Anion gap 11 5 - 15    Comment: Performed at [facility], [address] [place], [place] [address]  Glucose, capillary     Status: Abnormal   Collection Time: 01/15/23  9:03 AM  Result Value Ref Range   Glucose-Capillary 200 (H) 70 - 99 mg/dL    Comment: Glucose reference range applies only to samples taken after fasting for at least 8 hours.  Glucose, capillary     Status: Abnormal   Collection Time: 01/15/23 11:23 AM  Result Value Ref Range   Glucose-Capillary 197 (H) 70 - 99 mg/dL    Comment: Glucose reference range applies only to samples taken after fasting for at least 8 hours.  Glucose, capillary     Status:  Abnormal   Collection Time: 01/15/23  4:28 PM  Result Value Ref Range   Glucose-Capillary 287 (H) 70 - 99 mg/dL    Comment: Glucose reference range applies only to samples taken after fasting for at least 8 hours.  CBC     Status: Abnormal   Collection Time: 01/16/23  7:08 AM  Result Value Ref Range   WBC 8.4 4.0 - 10.5 K/uL   RBC 3.48 (L) 4.22 - 5.81 MIL/uL   Hemoglobin 9.9 (L) 13.0 - 17.0 g/dL   HCT 16.1 (L) 09.6 - 04.5 %   MCV 86.5 80.0 - 100.0 fL   MCH 28.4 26.0 - 34.0 pg   MCHC 32.9 30.0 - 36.0 g/dL   RDW 40.9 81.1 - 91.4 %   Platelets 287 150 - 400 K/uL   nRBC 0.0 0.0 - 0.2 %    Comment: Performed at [facility], [address] [place], [place] [address]  Comprehensive metabolic panel     Status: Abnormal   Collection Time: 01/16/23  7:08 AM  Result Value Ref Range   Sodium 131 (L) 135 - 145 mmol/L   Potassium 3.5 3.5 - 5.1 mmol/L   Chloride 101  98 - 111 mmol/L   CO2 19 (L) 22 - 32 mmol/L   Glucose, Bld 302 (H) 70 - 99 mg/dL    Comment: Glucose reference range applies only to samples taken after fasting for at least 8 hours.   BUN 19 8 - 23 mg/dL   Creatinine, Ser 6.21 0.61 - 1.24 mg/dL   Calcium 8.9 8.9 - 30.8 mg/dL   Total Protein 6.2 (L) 6.5 - 8.1 g/dL   Albumin 3.3 (L) 3.5 - 5.0 g/dL   AST 32 15 - 41 U/L   ALT 22 0 - 44 U/L   Alkaline Phosphatase 73 38 - 126 U/L   Total Bilirubin 0.6 0.3 - 1.2 mg/dL   GFR, Estimated >65 >78 mL/min    Comment: (NOTE) Calculated using the CKD-EPI Creatinine Equation (2021)    Anion gap 11 5 - 15    Comment: Performed at [facility], [address], [place], [place] [address]  Glucose, capillary     Status: Abnormal   Collection Time: 01/16/23  9:03 AM  Result Value Ref Range   Glucose-Capillary 260 (H) 70 - 99 mg/dL    Comment: Glucose reference range applies only to samples taken after fasting for at least 8 hours.   Comment 1 Notify RN     MR ABDOMEN MRCP W WO CONTAST  Result Date: 01/15/2023 CLINICAL DATA:  Cholelithiasis EXAM: MRI ABDOMEN WITHOUT AND WITH CONTRAST (INCLUDING MRCP) TECHNIQUE: Multiplanar multisequence MR imaging of the abdomen was performed both before and after the administration of intravenous contrast. Heavily T2-weighted images of the biliary and pancreatic ducts were obtained, and three-dimensional MRCP images were rendered by post processing. CONTRAST:  4mL GADAVIST GADOBUTROL 1 MMOL/ML IV SOLN COMPARISON:  CT abdomen pelvis, 01/14/2023 FINDINGS: Examination is generally limited by breath motion artifact. Lower chest: Heterogeneous airspace opacity of the bilateral lung bases. Hepatobiliary: No solid liver abnormality is seen. Gallstones and sludge in the dependent gallbladder. No gallbladder wall thickening. Severe intra and extrahepatic biliary ductal dilatation, common bile duct measuring up to 1.8 cm. Tiny gallstones and or sludge within the cystic duct  (series 4, image 17) and likely near the ampulla (series 2, image 15, series 3, image 23). Pancreas: Unremarkable. No pancreatic ductal dilatation or surrounding inflammatory changes. Spleen:  Normal in size without significant abnormality. Adrenals/Urinary Tract: Adrenal glands are unremarkable. Kidneys are normal, without renal calculi, solid lesion, or hydronephrosis. Stomach/Bowel: Stomach is within normal limits. No evidence of bowel wall thickening, distention, or inflammatory changes. Vascular/Lymphatic: Aortic atherosclerosis. No enlarged abdominal lymph nodes. Other: No abdominal wall hernia or abnormality. No ascites. Musculoskeletal: No acute or significant osseous findings. IMPRESSION: 1. Severe intra and extrahepatic biliary ductal dilatation, common bile duct measuring up to 1.8 cm. Tiny gallstones and or sludge within the cystic duct and likely near the ampulla. 2. Gallstones and sludge in the dependent gallbladder. No gallbladder wall thickening. 3. Heterogeneous airspace opacity of the bilateral lung bases. Electronically Signed   By: Jearld Lesch M.D.   On: 01/15/2023 23:06   MR LUMBAR SPINE WO CONTRAST  Result Date: 01/15/2023 CLINICAL DATA:  Low back pain EXAM: MRI LUMBAR SPINE WITHOUT CONTRAST TECHNIQUE: Multiplanar, multisequence MR imaging of the lumbar spine was performed. No intravenous contrast was administered. COMPARISON:  None Available. FINDINGS: Segmentation:  Standard. Alignment:  Physiologic. Vertebrae:  No fracture, evidence of discitis, or bone lesion. Conus medullaris and cauda equina: Conus extends to the L1 level. Conus and cauda equina appear normal. Paraspinal and other soft tissues: Negative. Disc levels: L1-L2: Normal disc space and facet joints. No spinal canal stenosis. No neural foraminal stenosis. L2-L3: Small disc bulge. No spinal canal stenosis. No neural foraminal stenosis. L3-L4: Intermediate sized right subarticular disc osteophyte complex narrowing the right  lateral recess and displacing the right L4 nerve root. No central spinal canal stenosis. No neural foraminal stenosis. L4-L5: Small left subarticular disc extrusion with inferior migration narrowing the left lateral recess. No spinal canal stenosis. No neural foraminal stenosis. L5-S1: Small disc bulge with mild facet hypertrophy. No spinal canal stenosis. No neural foraminal stenosis. Visualized sacrum: Normal. IMPRESSION: 1. Intermediate sized right subarticular disc osteophyte complex at L3-L4 narrowing the right lateral recess and displacing the right L4 nerve root. Correlate for right L4 radiculopathy. 2. Small left subarticular disc extrusion with inferior migration at L4-L5 narrowing the left lateral recess. Correlate for left L5 radiculopathy. 3. No spinal canal or neural foraminal stenosis. Electronically Signed   By: Deatra Robinson M.D.   On: 01/15/2023 22:54   ECHOCARDIOGRAM COMPLETE  Result Date: 01/15/2023    ECHOCARDIOGRAM REPORT   Patient Name:   Reginald Ayers Date of Exam: 01/15/2023 Medical Rec #:  [id number]      Height:       61.0 in Accession #:    [id number]     Weight:       95.9 lb Date of Birth:  1950/11/20      BSA:          1.382 m Patient Age:    72 years       BP:           178/70 mmHg Patient Gender: M              HR:           92 bpm. Exam Location:  Inpatient Procedure: 2D Echo, Color Doppler, Cardiac Doppler and Intracardiac            Opacification Agent Indications:    TIA  History:        Patient has prior history of Echocardiogram examinations. CAD,                 Prior CABG, COPD; Risk Factors:Hypertension, Diabetes and  Dyslipidemia.  Sonographer:    Mike Gip Referring Phys: [id number] JENNIFER YATES IMPRESSIONS  1. Technically difficult study with limited visualizaiton of cardiac structures.  2. Left ventricular ejection fraction, by estimation, is 50 to 55%. The left ventricle has low normal function. The left ventricle demonstrates regional wall motion  abnormalities (see scoring diagram/findings for description). The LV apex is dyskinetic and aneurysmal.  3. Right ventricular systolic function is normal. The right ventricular size is normal.  4. The mitral valve is normal in structure. No evidence of mitral valve regurgitation. No evidence of mitral stenosis.  5. The aortic valve is normal in structure. There is mild calcification of the aortic valve. Aortic valve regurgitation is not visualized. No aortic stenosis is present.  6. The inferior vena cava is normal in size with greater than 50% respiratory variability, suggesting right atrial pressure of 3 mmHg. FINDINGS  Left Ventricle: Left ventricular ejection fraction, by estimation, is 50 to 55%. The left ventricle has low normal function. The left ventricle demonstrates regional wall motion abnormalities. Definity contrast agent was given IV to delineate the left ventricular endocardial borders. The left ventricular internal cavity size was normal in size. There is no left ventricular hypertrophy. Left ventricular diastolic parameters are consistent with Grade I diastolic dysfunction (impaired relaxation).  LV Wall Scoring: The apex is dyskinetic. Right Ventricle: The right ventricular size is normal. No increase in right ventricular wall thickness. Right ventricular systolic function is normal. Left Atrium: Left atrial size was normal in size. Right Atrium: Right atrial size was normal in size. Pericardium: There is no evidence of pericardial effusion. Mitral Valve: The mitral valve is normal in structure. No evidence of mitral valve regurgitation. No evidence of mitral valve stenosis. Tricuspid Valve: The tricuspid valve is normal in structure. Tricuspid valve regurgitation is not demonstrated. No evidence of tricuspid stenosis. Aortic Valve: The aortic valve is normal in structure. There is mild calcification of the aortic valve. Aortic valve regurgitation is not visualized. No aortic stenosis is present.  Pulmonic Valve: The pulmonic valve was normal in structure. Pulmonic valve regurgitation is not visualized. No evidence of pulmonic stenosis. Aorta: The aortic root is normal in size and structure. Venous: The inferior vena cava is normal in size with greater than 50% respiratory variability, suggesting right atrial pressure of 3 mmHg. IAS/Shunts: No atrial level shunt detected by color flow Doppler.  LEFT VENTRICLE PLAX 2D LVIDd:         3.60 cm     Diastology LVIDs:         3.00 cm     LV e' medial:    5.66 cm/s LV PW:         0.70 cm     LV E/e' medial:  10.1 LV IVS:        0.70 cm     LV e' lateral:   9.03 cm/s LVOT diam:     1.80 cm     LV E/e' lateral: 6.3 LV SV:         36 LV SV Index:   26 LVOT Area:     2.54 cm  LV Volumes (MOD) LV vol d, MOD A2C: 55.0 ml LV vol d, MOD A4C: 64.9 ml LV vol s, MOD A2C: 22.2 ml LV vol s, MOD A4C: 27.6 ml LV SV MOD A2C:     32.8 ml LV SV MOD A4C:     64.9 ml LV SV MOD BP:      36.9 ml RIGHT  VENTRICLE            IVC RV Basal diam:  2.50 cm    IVC diam: 1.10 cm RV S prime:     8.92 cm/s TAPSE (M-mode): 1.3 cm LEFT ATRIUM           Index        RIGHT ATRIUM          Index LA diam:      2.90 cm 2.10 cm/m   RA Area:     7.08 cm LA Vol (A2C): 26.2 ml 18.96 ml/m  RA Volume:   9.15 ml  6.62 ml/m LA Vol (A4C): 22.9 ml 16.57 ml/m  AORTIC VALVE LVOT Vmax:   69.20 cm/s LVOT Vmean:  44.900 cm/s LVOT VTI:    0.143 m  AORTA Ao Root diam: 3.10 cm Ao Asc diam:  2.30 cm MITRAL VALVE MV Area (PHT): 4.26 cm    SHUNTS MV Decel Time: 178 msec    Systemic VTI:  0.14 m MV E velocity: 57.00 cm/s  Systemic Diam: 1.80 cm MV A velocity: 90.40 cm/s MV E/A ratio:  0.63 Aditya Sabharwal Electronically signed by Dorthula Nettles Signature Date/Time: 01/15/2023/2:06:35 PM    Final    MR BRAIN WO CONTRAST  Result Date: 01/14/2023 CLINICAL DATA:  Neuro deficit, acute, stroke suspected EXAM: MRI HEAD WITHOUT CONTRAST TECHNIQUE: Multiplanar, multiecho pulse sequences of the brain and surrounding  structures were obtained without intravenous contrast. COMPARISON:  Same day CT head.  MRI head Dec 26, 2021. FINDINGS: Brain: No acute infarction, hemorrhage, hydrocephalus, extra-axial collection or mass lesion. Mild for age scattered T2/FLAIR hyperintensities the white matter, nonspecific but compatible with chronic microvascular ischemic disease. Vascular: Abnormal left ICA flow void, compatible with known occlusion. Skull and upper cervical spine: Normal marrow signal. Sinuses/Orbits: Mild sinus mucosal thickening. No acute orbital findings. IMPRESSION: 1. No acute abnormality. 2. Chronically occluded left ICA. Electronically Signed   By: Feliberto Harts M.D.   On: 01/14/2023 18:06   CT ABDOMEN PELVIS WO CONTRAST  Result Date: 01/14/2023 CLINICAL DATA:  Lactic acidosis, back pain EXAM: CT ABDOMEN AND PELVIS WITHOUT CONTRAST TECHNIQUE: Multidetector CT imaging of the abdomen and pelvis was performed following the standard protocol without IV contrast. RADIATION DOSE REDUCTION: This exam was performed according to the departmental dose-optimization program which includes automated exposure control, adjustment of the mA and/or kV according to patient size and/or use of iterative reconstruction technique. COMPARISON:  Abdomen radiograph 01/14/2023, CT 12/20/2021 FINDINGS: Lower chest: Lung bases demonstrate small right-sided pleural effusion. Mild ground-glass density in the right lower lobe. Emphysema. Coronary vascular calcification Hepatobiliary: Distended gallbladder without calcified stone. No focal hepatic abnormality. Common bile duct is dilated, measuring up to 19 mm. No calcified stones are visualized. Suspicion of mild intra hepatic biliary dilatation. Pancreas: No inflammation.  Atrophic. Spleen: Normal in size without focal abnormality. Adrenals/Urinary Tract: Adrenal glands are within normal limits. Kidneys show no hydronephrosis. Punctate nonobstructing right kidney stones. The bladder is  distended Stomach/Bowel: Stomach nonenlarged. No dilated small bowel. Possible mild wall thickening at the hepatic flexure. Nonvisualized appendix. Vascular/Lymphatic: Advanced aortic atherosclerosis. No aneurysm. No suspicious lymph nodes. Reproductive: Prostate is unremarkable. Other: Negative for pelvic effusion or free air. Musculoskeletal: No acute or suspicious osseous abnormality. IMPRESSION: 1. Small right-sided pleural effusion. Mild ground-glass density in the right lower lobe could be due to mild pneumonia. 2. Distended gallbladder without calcified stone. Dilated common bile duct measuring up to 19 mm with suspicion of intra hepatic  biliary dilatation. Suggest correlation with LFTs and follow-up MRCP as indicated. 3. Possible mild wall thickening at the hepatic flexure of the colon, correlate for any history of diarrhea/colitis type symptoms 4. Punctate nonobstructing right kidney stones. 5. Aortic atherosclerosis. Aortic Atherosclerosis (ICD10-I70.0). Electronically Signed   By: Jasmine Pang M.D.   On: 01/14/2023 15:36   DG Abd Portable 1V  Result Date: 01/14/2023 CLINICAL DATA:  [id number] Pain [id number] EXAM: PORTABLE ABDOMEN - 1 VIEW COMPARISON:  CT 12/20/2021 FINDINGS: Effusion and airspace disease at the right lung base. Stomach and small bowel are nondilated. Moderate colonic fecal material without dilatation. Regional bones unremarkable. Left common iliac and right femoral vascular stents. IMPRESSION: Nonobstructive bowel gas pattern with moderate colonic fecal material. Electronically Signed   By: Corlis Leak M.D.   On: 01/14/2023 14:34   DG Chest Port 1 View  Result Date: 01/14/2023 CLINICAL DATA:  Pain. EXAM: PORTABLE CHEST 1 VIEW COMPARISON:  03/05/2022. FINDINGS: Rotated patient. No consolidation or pulmonary edema. Chronic scarring in the right lung base. Normal heart size. Stable mediastinal contours with postoperative changes of median sternotomy and CABG. No pleural effusion or  pneumothorax. Visualized bones and upper abdomen are unremarkable. IMPRESSION: No evidence of acute cardiopulmonary disease. Electronically Signed   By: Orvan Falconer M.D.   On: 01/14/2023 12:18   CT HEAD WO CONTRAST  Result Date: 01/14/2023 CLINICAL DATA:  Neuro deficit. Stroke suspected. Weakness and confusion. EXAM: CT HEAD WITHOUT CONTRAST TECHNIQUE: Contiguous axial images were obtained from the base of the skull through the vertex without intravenous contrast. RADIATION DOSE REDUCTION: This exam was performed according to the departmental dose-optimization program which includes automated exposure control, adjustment of the mA and/or kV according to patient size and/or use of iterative reconstruction technique. COMPARISON:  12/26/2021 FINDINGS: Brain: No evidence of acute infarction, hemorrhage, hydrocephalus, extra-axial collection or mass lesion/mass effect. There is mild low-attenuation within the subcortical and periventricular white matter compatible with chronic microvascular disease. Prominence of the sulci and ventricles compatible with brain atrophy. Vascular: No hyperdense vessel or unexpected calcification. Skull: Normal. Negative for fracture or focal lesion. Sinuses/Orbits: Postoperative changes involving the paranasal sinuses. Mucosal thickening is noted involving bilateral maxillary sinuses. No sinus fluid levels. Other: None IMPRESSION: 1. No acute intracranial abnormalities. 2. Chronic microvascular disease and brain atrophy. Electronically Signed   By: Signa Kell M.D.   On: 01/14/2023 12:10    ROS negative except above we did discuss his lungs his heart and his main problem which is his foot pain which is helped by 2 aspirin and 2 Tylenol at home Blood pressure (!) 150/70, pulse 88, temperature 98.6 F (37 C), temperature source Axillary, resp. rate 18, height 5\' 1"  (1.549 m), weight 43.5 kg, SpO2 99 %. Physical Exam elderly thin no acute distress lying comfortably in the bed  exam pertinent for his abdomen being soft nontender labs CT and MRCP reviewed  Assessment/Plan: Concerns over possible CBD stones in a patient with multiple medical problems Plan: The risk benefits methods and success rate was thoroughly discussed with the patient about ERCP sphincterotomy and probable stone extraction but based on his multiple other medical issues and doing fine now at the current time he would like to hold off and I have reserved Tuesday from 1-3 if he changes his mind or you are able to convince him he should have it now and we will hold his Plavix and Eliquis in the meantime in case we proceed otherwise I will be happy to  see back as an outpatient if primary team Dr. Nehemiah Settle would like for me to proceed and based on increased aspirin use in the face of other blood thinners he probably should be on a pump inhibitor long-term to protect his stomach and I will check back on him on Monday but call me sooner if question or problem or if you are able to convince him to proceed  [facility] E 01/16/2023, 11:09 AM

## 2023-01-16 NOTE — Progress Notes (Signed)
ANTICOAGULATION CONSULT NOTE - Follow Up Consult  Pharmacy Consult for heparin Indication: atrial fibrillation in setting of possible ERCP this week  Allergies  Allergen Reactions   Actos [Pioglitazone] Shortness Of Breath    Leg swelling    Lipitor [Atorvastatin] Other (See Comments)    Leg pain    Zestril [Lisinopril] Cough    Patient Measurements: Height: 5\' 1"  (154.9 cm) Weight: 43.5 kg (95 lb 14.4 oz) IBW/kg (Calculated) : 52.3 kg Heparin dosing weight: 43.5 kg  Vital Signs: Temp: 98.1 F (36.7 C) (05/25 1618) Temp Source: Oral (05/25 1618) BP: 163/92 (05/25 1618) Pulse Rate: 92 (05/25 1618)  Labs: Recent Labs    01/14/23 1120 01/14/23 1129 01/14/23 1344 01/14/23 1412 01/15/23 0717 01/16/23 0708 01/16/23 1732  HGB 9.4* 10.5*  --  10.2* 10.2* 9.9*  --   HCT 30.3* 31.0*  --  30.0* 31.5* 30.1*  --   PLT 271  --   --   --  277 287  --   APTT 31  --   --   --   --   --  44*  LABPROT 15.4*  --   --   --   --   --   --   INR 1.2  --   --   --   --   --   --   HEPARINUNFRC  --   --   --   --   --   --  >1.10*  CREATININE 1.29* 1.20  --   --  0.95 0.98  --   TROPONINIHS 16  --  16  --   --   --   --     Estimated Creatinine Clearance: 40.7 mL/min (by C-G formula based on SCr of 0.98 mg/dL).  Medical History: Past Medical History:  Diagnosis Date   Carotid artery disease (HCC)    L-ICA 100%, mod R-ICA dz   COPD (chronic obstructive pulmonary disease) (HCC)    Coronary artery disease    s/p CABG February 2010 by Dr. Andrey Spearman   Diabetes [facility])    Hyperlipidemia    Hypertension    Peripheral arterial disease (HCC)    post left common iliac and right SFA stenting remotely   Tobacco abuse        Medications:  Infusions:   sodium chloride 100 mL/hr at 01/15/23 0603   heparin 650 Units/hr (01/16/23 0933)   Assessment: 72 YO M admitted with PMH of atrial fibrillation on Eliquis prior to admission. Pharmacy consulted to transition to heparin in  setting to possible ERCP. Last dose of apixaban 5/24 at 2100. Will not bolus in setting of recent apixaban use and monitor aPTT until correlating with heparin level.  Heparin level > 1.1 as expected with recent apixaban use, aPTT subtherapeutic at 44 sec. No issues with IV site or bleeding problems per RN.  Goal of Therapy:  Heparin level 0.3-0.7 units/ml aPTT 66-102 seconds Monitor platelets by anticoagulation protocol: Yes   Plan:  Increase heparin to 750 units/hr. Check 8 aPTT and heparin level.  Daily CBC, aPTT, and heparin level. Monitor for signs/symptoms of bleeding.   Loralee Pacas, PharmD, BCPS 01/16/2023,6:31 PM  Please check AMION for all [facility] Pharm[facility] phone numbers After 10:00 PM, call Main Pharmacy [phone]

## 2023-01-16 NOTE — Progress Notes (Signed)
Notified provider of pain in right leg and tachycardia, orders received.

## 2023-01-16 NOTE — Progress Notes (Addendum)
PROGRESS NOTE        PATIENT DETAILS Name: Reginald Ayers Age: 72 y.o. Sex: male Date of Birth: 1951-05-18 Admit Date: 01/14/2023 Admitting Physician Jonah Blue, MD ZOX:[id number], Selena Batten, NP  Brief Summary: Patient is a 72 y.o.  male COPD, CAD s/p CABG, PAD s/p stent, HLD, HTN-who presented with weakness.  Per H&P-patient was found at home with bedbugs, and covered in fecal matter.   Significant events: 5/23>> admit to [facility]  Significant studies: 5/23>> MRI brain: No acute abnormality-chronically occluded left ICA 5/23>> CT abdomen/pelvis: Mild groundglass density in the right lower lobe, distended gallbladder  without calcified stone, dilated CBD measuring up to 19 mm with suspicion for intrahepatic dilatation. 5/24>> MRCP:Severe intrahepatic/extrahepatic biliary ductal dilatation-tiny gallstones/sludge within the cystic duct near the ampulla.  Significant microbiology data: None  Procedures: None  Consults: None  Subjective: Sleeping when I walked in-denies any nausea/vomiting.  No abdominal pain.  Seems to be tolerating diet without any major issues.  Objective: Vitals: Blood pressure (!) 150/70, pulse 88, temperature 98.6 F (37 C), temperature source Axillary, resp. rate 18, height 5\' 1"  (1.549 m), weight 43.5 kg, SpO2 99 %.   Exam: Gen Exam:Alert awake-not in any distress HEENT:atraumatic, normocephalic Chest: B/L clear to auscultation anteriorly CVS:S1S2 regular Abdomen:soft non tender, non distended Extremities:no edema Neurology: Non focal Skin: no rash  Pertinent Labs/Radiology:    Latest Ref Rng & Units 01/16/2023    7:08 AM 01/15/2023    7:17 AM 01/14/2023    2:12 PM  CBC  WBC 4.0 - 10.5 K/uL 8.4  6.5    Hemoglobin 13.0 - 17.0 g/dL 9.9  40.9  81.1   Hematocrit 39.0 - 52.0 % 30.1  31.5  30.0   Platelets 150 - 400 K/uL 287  277      Lab Results  Component Value Date   NA 131 (L) 01/16/2023   K 3.5 01/16/2023   CL 101  01/16/2023   CO2 19 (L) 01/16/2023      Assessment/Plan: Weakness Poor historian-unclear etiology-given MRCP findings-concerned that his debility/deconditioning-concerned that his clinical presentation on admission could have been due to a passed CBD stone.    Anion gap metabolic acidosis Likely starvation ketoacidosis-due to poor intake/possible vomiting-in the setting of possible passed CBD stone. Improving with supportive care  Dilated common bile duct/intrahepatic dilatation MRI findings noted-discussed with Dr. Ewing Schlein regarding ERCP-tentatively scheduled for next Tuesday. He may have passed a CBD stone that caused him to have nausea/weakness and poor appetite.  He is a very poor historian.  HTN BP stable continue beta-blocker  CAD-s/p CABG No anginal symptoms See below regarding antiplatelets. Continue statin.  History of CVA No obvious deficits on exam-MRI brain negative Continue statin See below regarding antiplatelets.  PAF Sinus rhythm Eliquis held on 5/25-for potential ERCP on Tuesday-will place on IV heparin in the interim.  PAD Has had numerous procedures in the past-follow with Dr. Myra Gianotti Prior to this hospitalization he was on aspirin/Plavix/Eliquis Discussed with Dr. Myra Gianotti on 5/24-okay to stop Plavix Remains on aspirin/statin  DM-2 CBGs elevated today Add Semglee 10 units-c/w SSI Continue to hold metformin-May need to consider stopping given metabolic acidosis on presentation.   Recent Labs    01/15/23 1628 01/16/23 0903 01/16/23 1136  GLUCAP 287* 260* 327*     COPD Stable Bronchodilators  BMI: Estimated body mass index is  18.12 kg/m as calculated from the following:   Height as of this encounter: 5\' 1"  (1.549 m).   Weight as of this encounter: 43.5 kg.   Code status:   Code Status: DNR   DVT Prophylaxis:     Family Communication: Niece-[name] [phone]-left VM 5/25   Disposition Plan: Status is: Observation The  patient will require care spanning > 2 midnights and should be moved to inpatient because: Severity of illness.   Planned Discharge Destination:Home health   Diet: Diet Order             Diet regular Room service appropriate? Yes with Assist; Fluid consistency: Thin  Diet effective now                     Antimicrobial agents: Anti-infectives (From admission, onward)    Start     Dose/Rate Route Frequency Ordered Stop   01/14/23 1745  cefTRIAXone (ROCEPHIN) 1 g in sodium chloride 0.9 % 100 mL IVPB        1 g 200 mL/hr over 30 Minutes Intravenous  Once 01/14/23 1741 01/14/23 1922   01/14/23 1745  azithromycin (ZITHROMAX) 500 mg in sodium chloride 0.9 % 250 mL IVPB        500 mg 250 mL/hr over 60 Minutes Intravenous  Once 01/14/23 1741 01/14/23 2315        MEDICATIONS: Scheduled Meds:   stroke: early stages of recovery book   Does not apply Once   aspirin EC  81 mg Oral Daily   feeding supplement  237 mL Oral BID BM   insulin aspart  0-15 Units Subcutaneous TID WC   insulin aspart  0-5 Units Subcutaneous QHS   metoprolol succinate  25 mg Oral Daily   multivitamin with minerals  1 tablet Oral Daily   pantoprazole  40 mg Oral Daily   rosuvastatin  20 mg Oral Daily   umeclidinium bromide  1 puff Inhalation QHS   Continuous Infusions:  sodium chloride 100 mL/hr at 01/15/23 0603   heparin 650 Units/hr (01/16/23 0933)   PRN Meds:.acetaminophen **OR** acetaminophen (TYLENOL) oral liquid 160 mg/5 mL **OR** acetaminophen, albuterol, hydrALAZINE, ondansetron (ZOFRAN) IV   I have personally reviewed following labs and imaging studies  LABORATORY DATA: CBC: Recent Labs  Lab 01/14/23 1120 01/14/23 1129 01/14/23 1412 01/15/23 0717 01/16/23 0708  WBC 6.5  --   --  6.5 8.4  NEUTROABS 5.3  --   --   --   --   HGB 9.4* 10.5* 10.2* 10.2* 9.9*  HCT 30.3* 31.0* 30.0* 31.5* 30.1*  MCV 91.0  --   --  88.5 86.5  PLT 271  --   --  277 287     Basic Metabolic  Panel: Recent Labs  Lab 01/14/23 1120 01/14/23 1129 01/14/23 1412 01/15/23 0717 01/16/23 0708  NA 128* 128* 129* 131* 131*  K 4.7 4.7 4.1 3.5 3.5  CL 98 105  --  101 101  CO2 13*  --   --  19* 19*  GLUCOSE 192* 192*  --  202* 302*  BUN 40* 47*  --  16 19  CREATININE 1.29* 1.20  --  0.95 0.98  CALCIUM 9.3  --   --  8.6* 8.9     GFR: Estimated Creatinine Clearance: 40.7 mL/min (by C-G formula based on SCr of 0.98 mg/dL).  Liver Function Tests: Recent Labs  Lab 01/14/23 1120 01/15/23 0717 01/16/23 0708  AST 37 29 32  ALT 17  19 22  ALKPHOS 64 55 73  BILITOT 0.9 0.5 0.6  PROT 6.7 6.2* 6.2*  ALBUMIN 3.6 3.3* 3.3*    Recent Labs  Lab 01/14/23 1836  LIPASE 22    No results for input(s): "AMMONIA" in the last 168 hours.  Coagulation Profile: Recent Labs  Lab 01/14/23 1120  INR 1.2     Cardiac Enzymes: No results for input(s): "CKTOTAL", "CKMB", "CKMBINDEX", "TROPONINI" in the last 168 hours.  BNP (last 3 results) No results for input(s): "PROBNP" in the last 8760 hours.  Lipid Profile: Recent Labs    01/15/23 0428  CHOL 127  HDL 28*  LDLCALC 63  TRIG 161*  CHOLHDL 4.5     Thyroid Function Tests: No results for input(s): "TSH", "T4TOTAL", "FREET4", "T3FREE", "THYROIDAB" in the last 72 hours.  Anemia Panel: No results for input(s): "VITAMINB12", "FOLATE", "FERRITIN", "TIBC", "IRON", "RETICCTPCT" in the last 72 hours.  Urine analysis:    Component Value Date/Time   COLORURINE YELLOW 01/14/2023 1059   APPEARANCEUR CLEAR 01/14/2023 1059   LABSPEC 1.017 01/14/2023 1059   PHURINE 5.0 01/14/2023 1059   GLUCOSEU NEGATIVE 01/14/2023 1059   HGBUR SMALL (A) 01/14/2023 1059   BILIRUBINUR NEGATIVE 01/14/2023 1059   KETONESUR 5 (A) 01/14/2023 1059   PROTEINUR 100 (A) 01/14/2023 1059   UROBILINOGEN 0.2 07/07/2015 1842   NITRITE NEGATIVE 01/14/2023 1059   LEUKOCYTESUR NEGATIVE 01/14/2023 1059    Sepsis Labs: Lactic Acid, Venous    Component Value  Date/Time   LATICACIDVEN 1.6 01/10/2022 1854    MICROBIOLOGY: No results found for this or any previous visit (from the past 240 hour(s)).  RADIOLOGY STUDIES/RESULTS: MR ABDOMEN MRCP W WO CONTAST  Result Date: 01/15/2023 CLINICAL DATA:  Cholelithiasis EXAM: MRI ABDOMEN WITHOUT AND WITH CONTRAST (INCLUDING MRCP) TECHNIQUE: Multiplanar multisequence MR imaging of the abdomen was performed both before and after the administration of intravenous contrast. Heavily T2-weighted images of the biliary and pancreatic ducts were obtained, and three-dimensional MRCP images were rendered by post processing. CONTRAST:  4mL GADAVIST GADOBUTROL 1 MMOL/ML IV SOLN COMPARISON:  CT abdomen pelvis, 01/14/2023 FINDINGS: Examination is generally limited by breath motion artifact. Lower chest: Heterogeneous airspace opacity of the bilateral lung bases. Hepatobiliary: No solid liver abnormality is seen. Gallstones and sludge in the dependent gallbladder. No gallbladder wall thickening. Severe intra and extrahepatic biliary ductal dilatation, common bile duct measuring up to 1.8 cm. Tiny gallstones and or sludge within the cystic duct (series 4, image 17) and likely near the ampulla (series 2, image 15, series 3, image 23). Pancreas: Unremarkable. No pancreatic ductal dilatation or surrounding inflammatory changes. Spleen: Normal in size without significant abnormality. Adrenals/Urinary Tract: Adrenal glands are unremarkable. Kidneys are normal, without renal calculi, solid lesion, or hydronephrosis. Stomach/Bowel: Stomach is within normal limits. No evidence of bowel wall thickening, distention, or inflammatory changes. Vascular/Lymphatic: Aortic atherosclerosis. No enlarged abdominal lymph nodes. Other: No abdominal wall hernia or abnormality. No ascites. Musculoskeletal: No acute or significant osseous findings. IMPRESSION: 1. Severe intra and extrahepatic biliary ductal dilatation, common bile duct measuring up to 1.8 cm. Tiny  gallstones and or sludge within the cystic duct and likely near the ampulla. 2. Gallstones and sludge in the dependent gallbladder. No gallbladder wall thickening. 3. Heterogeneous airspace opacity of the bilateral lung bases. Electronically Signed   By: Jearld Lesch M.D.   On: 01/15/2023 23:06   MR LUMBAR SPINE WO CONTRAST  Result Date: 01/15/2023 CLINICAL DATA:  Low back pain EXAM: MRI LUMBAR SPINE WITHOUT  CONTRAST TECHNIQUE: Multiplanar, multisequence MR imaging of the lumbar spine was performed. No intravenous contrast was administered. COMPARISON:  None Available. FINDINGS: Segmentation:  Standard. Alignment:  Physiologic. Vertebrae:  No fracture, evidence of discitis, or bone lesion. Conus medullaris and cauda equina: Conus extends to the L1 level. Conus and cauda equina appear normal. Paraspinal and other soft tissues: Negative. Disc levels: L1-L2: Normal disc space and facet joints. No spinal canal stenosis. No neural foraminal stenosis. L2-L3: Small disc bulge. No spinal canal stenosis. No neural foraminal stenosis. L3-L4: Intermediate sized right subarticular disc osteophyte complex narrowing the right lateral recess and displacing the right L4 nerve root. No central spinal canal stenosis. No neural foraminal stenosis. L4-L5: Small left subarticular disc extrusion with inferior migration narrowing the left lateral recess. No spinal canal stenosis. No neural foraminal stenosis. L5-S1: Small disc bulge with mild facet hypertrophy. No spinal canal stenosis. No neural foraminal stenosis. Visualized sacrum: Normal. IMPRESSION: 1. Intermediate sized right subarticular disc osteophyte complex at L3-L4 narrowing the right lateral recess and displacing the right L4 nerve root. Correlate for right L4 radiculopathy. 2. Small left subarticular disc extrusion with inferior migration at L4-L5 narrowing the left lateral recess. Correlate for left L5 radiculopathy. 3. No spinal canal or neural foraminal stenosis.  Electronically Signed   By: Deatra Robinson M.D.   On: 01/15/2023 22:54   ECHOCARDIOGRAM COMPLETE  Result Date: 01/15/2023    ECHOCARDIOGRAM REPORT   Patient Name:   Reginald Ayers Date of Exam: 01/15/2023 Medical Rec #:  [id number]      Height:       61.0 in Accession #:    [id number]     Weight:       95.9 lb Date of Birth:  [birth year]      BSA:          1.382 m Patient Age:    72 years       BP:           178/70 mmHg Patient Gender: M              HR:           92 bpm. Exam Location:  Inpatient Procedure: 2D Echo, Color Doppler, Cardiac Doppler and Intracardiac            Opacification Agent Indications:    TIA  History:        Patient has prior history of Echocardiogram examinations. CAD,                 Prior CABG, COPD; Risk Factors:Hypertension, Diabetes and                 Dyslipidemia.  Sonographer:    Mike Gip Referring Phys: [id number] JENNIFER YATES IMPRESSIONS  1. Technically difficult study with limited visualizaiton of cardiac structures.  2. Left ventricular ejection fraction, by estimation, is 50 to 55%. The left ventricle has low normal function. The left ventricle demonstrates regional wall motion abnormalities (see scoring diagram/findings for description). The LV apex is dyskinetic and aneurysmal.  3. Right ventricular systolic function is normal. The right ventricular size is normal.  4. The mitral valve is normal in structure. No evidence of mitral valve regurgitation. No evidence of mitral stenosis.  5. The aortic valve is normal in structure. There is mild calcification of the aortic valve. Aortic valve regurgitation is not visualized. No aortic stenosis is present.  6. The inferior vena cava is normal in size with greater than 50%  respiratory variability, suggesting right atrial pressure of 3 mmHg. FINDINGS  Left Ventricle: Left ventricular ejection fraction, by estimation, is 50 to 55%. The left ventricle has low normal function. The left ventricle demonstrates regional wall motion  abnormalities. Definity contrast agent was given IV to delineate the left ventricular endocardial borders. The left ventricular internal cavity size was normal in size. There is no left ventricular hypertrophy. Left ventricular diastolic parameters are consistent with Grade I diastolic dysfunction (impaired relaxation).  LV Wall Scoring: The apex is dyskinetic. Right Ventricle: The right ventricular size is normal. No increase in right ventricular wall thickness. Right ventricular systolic function is normal. Left Atrium: Left atrial size was normal in size. Right Atrium: Right atrial size was normal in size. Pericardium: There is no evidence of pericardial effusion. Mitral Valve: The mitral valve is normal in structure. No evidence of mitral valve regurgitation. No evidence of mitral valve stenosis. Tricuspid Valve: The tricuspid valve is normal in structure. Tricuspid valve regurgitation is not demonstrated. No evidence of tricuspid stenosis. Aortic Valve: The aortic valve is normal in structure. There is mild calcification of the aortic valve. Aortic valve regurgitation is not visualized. No aortic stenosis is present. Pulmonic Valve: The pulmonic valve was normal in structure. Pulmonic valve regurgitation is not visualized. No evidence of pulmonic stenosis. Aorta: The aortic root is normal in size and structure. Venous: The inferior vena cava is normal in size with greater than 50% respiratory variability, suggesting right atrial pressure of 3 mmHg. IAS/Shunts: No atrial level shunt detected by color flow Doppler.  LEFT VENTRICLE PLAX 2D LVIDd:         3.60 cm     Diastology LVIDs:         3.00 cm     LV e' medial:    5.66 cm/s LV PW:         0.70 cm     LV E/e' medial:  10.1 LV IVS:        0.70 cm     LV e' lateral:   9.03 cm/s LVOT diam:     1.80 cm     LV E/e' lateral: 6.3 LV SV:         36 LV SV Index:   26 LVOT Area:     2.54 cm  LV Volumes (MOD) LV vol d, MOD A2C: 55.0 ml LV vol d, MOD A4C: 64.9 ml LV vol  s, MOD A2C: 22.2 ml LV vol s, MOD A4C: 27.6 ml LV SV MOD A2C:     32.8 ml LV SV MOD A4C:     64.9 ml LV SV MOD BP:      36.9 ml RIGHT VENTRICLE            IVC RV Basal diam:  2.50 cm    IVC diam: 1.10 cm RV S prime:     8.92 cm/s TAPSE (M-mode): 1.3 cm LEFT ATRIUM           Index        RIGHT ATRIUM          Index LA diam:      2.90 cm 2.10 cm/m   RA Area:     7.08 cm LA Vol (A2C): 26.2 ml 18.96 ml/m  RA Volume:   9.15 ml  6.62 ml/m LA Vol (A4C): 22.9 ml 16.57 ml/m  AORTIC VALVE LVOT Vmax:   69.20 cm/s LVOT Vmean:  44.900 cm/s LVOT VTI:    0.143 m  AORTA Ao  Root diam: 3.10 cm Ao Asc diam:  2.30 cm MITRAL VALVE MV Area (PHT): 4.26 cm    SHUNTS MV Decel Time: 178 msec    Systemic VTI:  0.14 m MV E velocity: 57.00 cm/s  Systemic Diam: 1.80 cm MV A velocity: 90.40 cm/s MV E/A ratio:  0.63 Aditya Sabharwal Electronically signed by Dorthula Nettles Signature Date/Time: 01/15/2023/2:06:35 PM    Final    MR BRAIN WO CONTRAST  Result Date: 01/14/2023 CLINICAL DATA:  Neuro deficit, acute, stroke suspected EXAM: MRI HEAD WITHOUT CONTRAST TECHNIQUE: Multiplanar, multiecho pulse sequences of the brain and surrounding structures were obtained without intravenous contrast. COMPARISON:  Same day CT head.  MRI head Dec 26, 2021. FINDINGS: Brain: No acute infarction, hemorrhage, hydrocephalus, extra-axial collection or mass lesion. Mild for age scattered T2/FLAIR hyperintensities the white matter, nonspecific but compatible with chronic microvascular ischemic disease. Vascular: Abnormal left ICA flow void, compatible with known occlusion. Skull and upper cervical spine: Normal marrow signal. Sinuses/Orbits: Mild sinus mucosal thickening. No acute orbital findings. IMPRESSION: 1. No acute abnormality. 2. Chronically occluded left ICA. Electronically Signed   By: Feliberto Harts M.D.   On: 01/14/2023 18:06   CT ABDOMEN PELVIS WO CONTRAST  Result Date: 01/14/2023 CLINICAL DATA:  Lactic acidosis, back pain EXAM: CT ABDOMEN  AND PELVIS WITHOUT CONTRAST TECHNIQUE: Multidetector CT imaging of the abdomen and pelvis was performed following the standard protocol without IV contrast. RADIATION DOSE REDUCTION: This exam was performed according to the departmental dose-optimization program which includes automated exposure control, adjustment of the mA and/or kV according to patient size and/or use of iterative reconstruction technique. COMPARISON:  Abdomen radiograph 01/14/2023, CT 12/20/2021 FINDINGS: Lower chest: Lung bases demonstrate small right-sided pleural effusion. Mild ground-glass density in the right lower lobe. Emphysema. Coronary vascular calcification Hepatobiliary: Distended gallbladder without calcified stone. No focal hepatic abnormality. Common bile duct is dilated, measuring up to 19 mm. No calcified stones are visualized. Suspicion of mild intra hepatic biliary dilatation. Pancreas: No inflammation.  Atrophic. Spleen: Normal in size without focal abnormality. Adrenals/Urinary Tract: Adrenal glands are within normal limits. Kidneys show no hydronephrosis. Punctate nonobstructing right kidney stones. The bladder is distended Stomach/Bowel: Stomach nonenlarged. No dilated small bowel. Possible mild wall thickening at the hepatic flexure. Nonvisualized appendix. Vascular/Lymphatic: Advanced aortic atherosclerosis. No aneurysm. No suspicious lymph nodes. Reproductive: Prostate is unremarkable. Other: Negative for pelvic effusion or free air. Musculoskeletal: No acute or suspicious osseous abnormality. IMPRESSION: 1. Small right-sided pleural effusion. Mild ground-glass density in the right lower lobe could be due to mild pneumonia. 2. Distended gallbladder without calcified stone. Dilated common bile duct measuring up to 19 mm with suspicion of intra hepatic biliary dilatation. Suggest correlation with LFTs and follow-up MRCP as indicated. 3. Possible mild wall thickening at the hepatic flexure of the colon, correlate for any  history of diarrhea/colitis type symptoms 4. Punctate nonobstructing right kidney stones. 5. Aortic atherosclerosis. Aortic Atherosclerosis (ICD10-I70.0). Electronically Signed   By: Jasmine Pang M.D.   On: 01/14/2023 15:36   DG Abd Portable 1V  Result Date: 01/14/2023 CLINICAL DATA:  [id number] Pain [id number] EXAM: PORTABLE ABDOMEN - 1 VIEW COMPARISON:  CT 12/20/2021 FINDINGS: Effusion and airspace disease at the right lung base. Stomach and small bowel are nondilated. Moderate colonic fecal material without dilatation. Regional bones unremarkable. Left common iliac and right femoral vascular stents. IMPRESSION: Nonobstructive bowel gas pattern with moderate colonic fecal material. Electronically Signed   By: Corlis Leak M.D.   On: 01/14/2023 14:34  DG Chest Port 1 View  Result Date: 01/14/2023 CLINICAL DATA:  Pain. EXAM: PORTABLE CHEST 1 VIEW COMPARISON:  03/05/2022. FINDINGS: Rotated patient. No consolidation or pulmonary edema. Chronic scarring in the right lung base. Normal heart size. Stable mediastinal contours with postoperative changes of median sternotomy and CABG. No pleural effusion or pneumothorax. Visualized bones and upper abdomen are unremarkable. IMPRESSION: No evidence of acute cardiopulmonary disease. Electronically Signed   By: Orvan Falconer M.D.   On: 01/14/2023 12:18   CT HEAD WO CONTRAST  Result Date: 01/14/2023 CLINICAL DATA:  Neuro deficit. Stroke suspected. Weakness and confusion. EXAM: CT HEAD WITHOUT CONTRAST TECHNIQUE: Contiguous axial images were obtained from the base of the skull through the vertex without intravenous contrast. RADIATION DOSE REDUCTION: This exam was performed according to the departmental dose-optimization program which includes automated exposure control, adjustment of the mA and/or kV according to patient size and/or use of iterative reconstruction technique. COMPARISON:  12/26/2021 FINDINGS: Brain: No evidence of acute infarction, hemorrhage,  hydrocephalus, extra-axial collection or mass lesion/mass effect. There is mild low-attenuation within the subcortical and periventricular white matter compatible with chronic microvascular disease. Prominence of the sulci and ventricles compatible with brain atrophy. Vascular: No hyperdense vessel or unexpected calcification. Skull: Normal. Negative for fracture or focal lesion. Sinuses/Orbits: Postoperative changes involving the paranasal sinuses. Mucosal thickening is noted involving bilateral maxillary sinuses. No sinus fluid levels. Other: None IMPRESSION: 1. No acute intracranial abnormalities. 2. Chronic microvascular disease and brain atrophy. Electronically Signed   By: Signa Kell M.D.   On: 01/14/2023 12:10     LOS: 1 day   [name] Massed, MD  [facility]    To contact the attending provider between 7A-7P or the covering provider during after hours 7P-7A, please log into the web site [email or web address] and access using universal [place] password for that web site. If you do not have the password, please call the hospital operator.  01/16/2023, 11:39 AM

## 2023-01-17 DIAGNOSIS — E8729 Other acidosis: Secondary | ICD-10-CM | POA: Diagnosis not present

## 2023-01-17 DIAGNOSIS — J449 Chronic obstructive pulmonary disease, unspecified: Secondary | ICD-10-CM | POA: Diagnosis not present

## 2023-01-17 DIAGNOSIS — Z66 Do not resuscitate: Secondary | ICD-10-CM | POA: Diagnosis not present

## 2023-01-17 DIAGNOSIS — I10 Essential (primary) hypertension: Secondary | ICD-10-CM | POA: Diagnosis not present

## 2023-01-17 LAB — CBC
HCT: 28.7 % — ABNORMAL LOW (ref 39.0–52.0)
Hemoglobin: 9.3 g/dL — ABNORMAL LOW (ref 13.0–17.0)
MCH: 28.5 pg (ref 26.0–34.0)
MCHC: 32.4 g/dL (ref 30.0–36.0)
MCV: 88 fL (ref 80.0–100.0)
Platelets: 285 10*3/uL (ref 150–400)
RBC: 3.26 MIL/uL — ABNORMAL LOW (ref 4.22–5.81)
RDW: 14.5 % (ref 11.5–15.5)
WBC: 8.4 10*3/uL (ref 4.0–10.5)
nRBC: 0 % (ref 0.0–0.2)

## 2023-01-17 LAB — GLUCOSE, CAPILLARY
Glucose-Capillary: 250 mg/dL — ABNORMAL HIGH (ref 70–99)
Glucose-Capillary: 193 mg/dL — ABNORMAL HIGH (ref 70–99)
Glucose-Capillary: 330 mg/dL — ABNORMAL HIGH (ref 70–99)
Glucose-Capillary: 139 mg/dL — ABNORMAL HIGH (ref 70–99)

## 2023-01-17 LAB — APTT
aPTT: 51 seconds — ABNORMAL HIGH (ref 24–36)
aPTT: 82 seconds — ABNORMAL HIGH (ref 24–36)
aPTT: 86 seconds — ABNORMAL HIGH (ref 24–36)

## 2023-01-17 LAB — HEPARIN LEVEL (UNFRACTIONATED): Heparin Unfractionated: >1.1 IU/mL — ABNORMAL HIGH (ref 0.30–0.70)

## 2023-01-17 MED ORDER — AMLODIPINE BESYLATE 5 MG PO TABS
5.0000 mg | ORAL_TABLET | Freq: Every day | ORAL | Status: DC
  Administered 2023-01-17 – 2023-01-29 (×12): 5 mg via ORAL
  Filled 2023-01-17 (×13): qty 1

## 2023-01-17 NOTE — Progress Notes (Addendum)
ANTICOAGULATION CONSULT NOTE - Follow Up Consult  Pharmacy Consult for heparin Indication: atrial fibrillation in setting of possible ERCP this week   Allergies  Allergen Reactions   Actos [Pioglitazone] Shortness Of Breath    Leg swelling    Lipitor [Atorvastatin] Other (See Comments)    Leg pain    Zestril [Lisinopril] Cough   Patient Measurements: Height: 5\' 1"  (154.9 cm) Weight: 43.5 kg (95 lb 14.4 oz) IBW/kg (Calculated) : 52.3 kg Heparin Dosing Weight: 43.5 kg  Vital Signs: Temp: 98.2 F (36.8 C) (05/26 0400) Temp Source: Oral (05/26 0400) BP: 155/70 (05/26 1200) Pulse Rate: 101 (05/26 1300)  Labs: Recent Labs    01/15/23 0717 01/16/23 0708 01/16/23 1732 01/17/23 0346 01/17/23 1342  HGB 10.2* 9.9*  --  9.3*  --   HCT 31.5* 30.1*  --  28.7*  --   PLT 277 287  --  285  --   APTT  --   --  44* 51* 82*  HEPARINUNFRC  --   --  >1.10* >1.10*  --   CREATININE 0.95 0.98  --   --   --    Estimated Creatinine Clearance: 40.7 mL/min (by C-G formula based on SCr of 0.98 mg/dL).  Medications:  Infusions:   sodium chloride 100 mL/hr at 01/15/23 0603   heparin 900 Units/hr (01/17/23 6962)   Assessment: 72 YO M admitted with PMH of atrial fibrillation on Eliquis prior to admission. Pharmacy consulted to transition to heparin in setting to possible ERCP. Last dose of apixaban 5/24 at 2100. Will not bolus in setting of recent apixaban use and monitor aPTT until correlating with heparin level. aPTT today is therapeutic at 82, on 900 units/hr. Hgb 9.3, plt 285. No line issues or signs/symptoms of bleeding noted.   Goal of Therapy:  Heparin level 0.3-0.7 units/ml aPTT 66-102 seconds Monitor platelets by anticoagulation protocol: Yes   Plan:  Continue heparin 900 units/hr. Check 8 aPTT level.  Daily CBC, aPTT, heparin level. Monitor for signs/symptoms of bleeding.  Lisabeth Pick [facility] 01/17/2023,2:35 PM

## 2023-01-17 NOTE — Progress Notes (Signed)
ANTICOAGULATION CONSULT NOTE - Follow Up Consult  Pharmacy Consult for heparin Indication: atrial fibrillation in setting of possible ERCP this week   Allergies  Allergen Reactions   Actos [Pioglitazone] Shortness Of Breath    Leg swelling    Lipitor [Atorvastatin] Other (See Comments)    Leg pain    Zestril [Lisinopril] Cough   Patient Measurements: Height: 5\' 1"  (154.9 cm) Weight: 43.5 kg (95 lb 14.4 oz) IBW/kg (Calculated) : 52.3 kg Heparin Dosing Weight: 43.5 kg  Vital Signs: Temp: 98.7 F (37.1 C) (05/26 1932) Temp Source: Oral (05/26 1932) BP: 147/73 (05/26 1932) Pulse Rate: 109 (05/26 1932)  Labs: Recent Labs    01/15/23 0717 01/16/23 0708 01/16/23 1732 01/16/23 1732 01/17/23 0346 01/17/23 1342 01/17/23 2140  HGB 10.2* 9.9*  --   --  9.3*  --   --   HCT 31.5* 30.1*  --   --  28.7*  --   --   PLT 277 287  --   --  285  --   --   APTT  --   --  44*   < > 51* 82* 86*  HEPARINUNFRC  --   --  >1.10*  --  >1.10*  --   --   CREATININE 0.95 0.98  --   --   --   --   --    < > = values in this interval not displayed.    Estimated Creatinine Clearance: 40.7 mL/min (by C-G formula based on SCr of 0.98 mg/dL).  Medications:  Infusions:   sodium chloride 100 mL/hr at 01/17/23 2105   heparin 900 Units/hr (01/17/23 1914)   Assessment: 72 YO M admitted with PMH of atrial fibrillation on Eliquis prior to admission. Pharmacy consulted to transition to heparin in setting to possible ERCP. Last dose of apixaban 5/24 at 2100. Will not bolus in setting of recent apixaban use and monitor aPTT until correlating with heparin level. aPTT today is therapeutic at 82, on 900 units/hr. Hgb 9.3, plt 285. No line issues or signs/symptoms of bleeding noted.   Repeat PTT therapeutic this PM.   Goal of Therapy:  Heparin level: 0.3-0.7 INR 2-3 aPTT 66-102 seconds Monitor platelets by anticoagulation protocol: Yes   Plan:  Continue heparin 900 units/hr. Daily CBC, aPTT, heparin  level. Monitor for signs/symptoms of bleeding.   Ulyses Southward, PharmD, BCIDP, AAHIVP, CPP Infectious Disease Pharmacist 01/17/2023 10:14 PM

## 2023-01-17 NOTE — Progress Notes (Signed)
ANTICOAGULATION CONSULT NOTE - Follow Up Consult  Pharmacy Consult for heparin Indication: atrial fibrillation in setting of possible ERCP this week  Allergies  Allergen Reactions   Actos [Pioglitazone] Shortness Of Breath    Leg swelling    Lipitor [Atorvastatin] Other (See Comments)    Leg pain    Zestril [Lisinopril] Cough    Patient Measurements: Height: 5\' 1"  (154.9 cm) Weight: 43.5 kg (95 lb 14.4 oz) IBW/kg (Calculated) : 52.3 kg Heparin dosing weight: 43.5 kg  Vital Signs: Temp: 98.2 F (36.8 C) (05/26 0400) Temp Source: Oral (05/26 0400) BP: 180/86 (05/26 0400) Pulse Rate: 100 (05/26 0400)  Labs: Recent Labs    01/14/23 1120 01/14/23 1129 01/14/23 1344 01/14/23 1412 01/15/23 0717 01/16/23 0708 01/16/23 1732 01/17/23 0346  HGB 9.4* 10.5*  --    < > 10.2* 9.9*  --  9.3*  HCT 30.3* 31.0*  --    < > 31.5* 30.1*  --  28.7*  PLT 271  --   --   --  277 287  --  285  APTT 31  --   --   --   --   --  44* 51*  LABPROT 15.4*  --   --   --   --   --   --   --   INR 1.2  --   --   --   --   --   --   --   HEPARINUNFRC  --   --   --   --   --   --  >1.10* >1.10*  CREATININE 1.29* 1.20  --   --  0.95 0.98  --   --   TROPONINIHS 16  --  16  --   --   --   --   --    < > = values in this interval not displayed.    Estimated Creatinine Clearance: 40.7 mL/min (by C-G formula based on SCr of 0.98 mg/dL).  Medical History: Past Medical History:  Diagnosis Date   Carotid artery disease (HCC)    L-ICA 100%, mod R-ICA dz   COPD (chronic obstructive pulmonary disease) (HCC)    Coronary artery disease    s/p CABG February 2010 by Dr. Andrey Spearman   Diabetes [facility])    Hyperlipidemia    Hypertension    Peripheral arterial disease (HCC)    post left common iliac and right SFA stenting remotely   Tobacco abuse        Medications:  Infusions:   sodium chloride 100 mL/hr at 01/15/23 0603   heparin 750 Units/hr (01/16/23 1848)   Assessment: 72 YO M admitted with  PMH of atrial fibrillation on Eliquis prior to admission. Pharmacy consulted to transition to heparin in setting to possible ERCP. Last dose of apixaban 5/24 at 2100. Will not bolus in setting of recent apixaban use and monitor aPTT until correlating with heparin level.  Heparin level > 1.1 as expected with recent apixaban use, aPTT subtherapeutic at 51 sec. No issues with infusion or overt s/sx of bleeding per RN.  Goal of Therapy:  Heparin level 0.3-0.7 units/ml aPTT 66-102 seconds Monitor platelets by anticoagulation protocol: Yes   Plan:  Increase heparin gtt (3 units/kg/hr) to 900 units/hr. Check 8 aPTT and heparin level.  Daily CBC, aPTT, and heparin level. Monitor for signs/symptoms of bleeding.   Ruben Im, PharmD Clinical Pharmacist 01/17/2023 6:01 AM Please check AMION for all [facility] Pharmacy numbers

## 2023-01-17 NOTE — Progress Notes (Signed)
Reginald Ayers 11:12 AM  Subjective: Patient without any GI complaints and he now says he will agree to the procedure on Tuesday and we can discuss his foot and how this has nothing to do with that we answered all of his questions  Objective: Vital signs stable afebrile no acute distress abdomen is soft nontender CBC okay no new chemistries  Assessment: Concerns over CBD stones  Plan: Will check on tomorrow but if tentatively scheduled for Tuesday at 1 PM for ERCP  [facility] E  office [phone] After 5PM or if no answer call [phone]

## 2023-01-17 NOTE — Progress Notes (Signed)
PROGRESS NOTE        PATIENT DETAILS Name: Reginald Ayers Age: 72 y.o. Sex: male Date of Birth: [birth year] Admit Date: 01/14/2023 Admitting Physician Jonah Blue, MD ZOX:[id number], Selena Batten, NP  Brief Summary: Patient is a 72 y.o.  male COPD, CAD s/p CABG, PAD s/p stent, HLD, HTN-who presented with weakness.  Per H&P-patient was found at home with bedbugs, and covered in fecal matter.   Significant events: 5/23>> admit to [facility]  Significant studies: 5/23>> MRI brain: No acute abnormality-chronically occluded left ICA 5/23>> CT abdomen/pelvis: Mild groundglass density in the right lower lobe, distended gallbladder  without calcified stone, dilated CBD measuring up to 19 mm with suspicion for intrahepatic dilatation. 5/24>> MRCP:Severe intrahepatic/extrahepatic biliary ductal dilatation-tiny gallstones/sludge within the cystic duct near the ampulla.  Significant microbiology data: None  Procedures: None  Consults: None  Subjective: Lying comfortably in bed-no major issues overnight.  Tolerating diet.  Only complaint is bilateral foot pain which is chronic going on for years.  Objective: Vitals: Blood pressure (!) 167/81, pulse (!) 108, temperature 98.2 F (36.8 C), temperature source Oral, resp. rate (!) 22, height 5\' 1"  (1.549 m), weight 43.5 kg, SpO2 98 %.   Exam: Gen Exam:Alert awake-not in any distress HEENT:atraumatic, normocephalic Chest: B/L clear to auscultation anteriorly CVS:S1S2 regular Abdomen:soft non tender, non distended Extremities:no edema Neurology: Non focal Skin: no rash  Pertinent Labs/Radiology:    Latest Ref Rng & Units 01/17/2023    3:46 AM 01/16/2023    7:08 AM 01/15/2023    7:17 AM  CBC  WBC 4.0 - 10.5 K/uL 8.4  8.4  6.5   Hemoglobin 13.0 - 17.0 g/dL 9.3  9.9  40.9   Hematocrit 39.0 - 52.0 % 28.7  30.1  31.5   Platelets 150 - 400 K/uL 285  287  277     Lab Results  Component Value Date   NA 131 (L) 01/16/2023   K  3.5 01/16/2023   CL 101 01/16/2023   CO2 19 (L) 01/16/2023      Assessment/Plan: Weakness Poor historian-unclear etiology-given MRCP findings-concerned that his debility/deconditioning-concerned that his clinical presentation on admission could have been due to a passed CBD stone/choledocholithiasis..    Anion gap metabolic acidosis Likely starvation ketoacidosis-due to poor intake/possible vomiting-in the setting of possible passed CBD stone. Improving with supportive care  Dilated common bile duct/intrahepatic dilatation Although he is a poor historian-suspect his presenting complaints are likely due to symptomatic choledocholithiasis-he may have passed a CBD stone. ERCP is tentatively scheduled for 5/28.    HTN BP remains elevated Continue beta-blocker Had metoprolol  CAD-s/p CABG No anginal symptoms See below regarding antiplatelets. Continue statin/beta-blocker  History of CVA No obvious deficits on exam-MRI brain negative Continue statin See below regarding antiplatelets.  PAF Sinus rhythm Eliquis held on 5/25-for potential ERCP on Tuesday-will place on IV heparin in the interim.  PAD Has had numerous procedures in the past-follow with Dr. Myra Gianotti Prior to this hospitalization he was on aspirin/Plavix/Eliquis Discussed with Dr. Myra Gianotti on 5/24-okay to stop Plavix (as he is on Eliquis) Remains on aspirin/statin  DM-2 CBGs relatively stable Semglee 10 units added on 5/25-will give it a few more days before adjusting Continue SSI.  Continue to hold metformin-May need to consider stopping given metabolic acidosis on presentation.   Recent Labs    01/16/23 1612 01/16/23 2126  01/17/23 0847  GLUCAP 289* 108* 250*      COPD Stable Bronchodilators  BMI: Estimated body mass index is 18.12 kg/m as calculated from the following:   Height as of this encounter: 5\' 1"  (1.549 m).   Weight as of this encounter: 43.5 kg.   Code status:   Code Status: DNR    DVT Prophylaxis: IV heparin gtt   Family Communication: Niece-[name] [phone]-left VM on 5/25, 5/26   Disposition Plan: Status is: Observation The patient will require care spanning > 2 midnights and should be moved to inpatient because: Severity of illness.   Planned Discharge Destination:Home health   Diet: Diet Order             Diet regular Room service appropriate? Yes with Assist; Fluid consistency: Thin  Diet effective now                     Antimicrobial agents: Anti-infectives (From admission, onward)    Start     Dose/Rate Route Frequency Ordered Stop   01/14/23 1745  cefTRIAXone (ROCEPHIN) 1 g in sodium chloride 0.9 % 100 mL IVPB        1 g 200 mL/hr over 30 Minutes Intravenous  Once 01/14/23 1741 01/14/23 1922   01/14/23 1745  azithromycin (ZITHROMAX) 500 mg in sodium chloride 0.9 % 250 mL IVPB        500 mg 250 mL/hr over 60 Minutes Intravenous  Once 01/14/23 1741 01/14/23 2315        MEDICATIONS: Scheduled Meds:   stroke: early stages of recovery book   Does not apply Once   aspirin EC  81 mg Oral Daily   feeding supplement  237 mL Oral BID BM   gabapentin  200 mg Oral BID   insulin aspart  0-15 Units Subcutaneous TID WC   insulin aspart  0-5 Units Subcutaneous QHS   insulin glargine-yfgn  10 Units Subcutaneous Daily   metoprolol succinate  25 mg Oral Daily   multivitamin with minerals  1 tablet Oral Daily   pantoprazole  40 mg Oral Daily   rosuvastatin  20 mg Oral Daily   umeclidinium bromide  1 puff Inhalation QHS   Continuous Infusions:  sodium chloride 100 mL/hr at 01/15/23 0603   heparin 900 Units/hr (01/17/23 0648)   PRN Meds:.acetaminophen **OR** acetaminophen (TYLENOL) oral liquid 160 mg/5 mL **OR** acetaminophen, albuterol, hydrALAZINE, ondansetron (ZOFRAN) IV   I have personally reviewed following labs and imaging studies  LABORATORY DATA: CBC: Recent Labs  Lab 01/14/23 1120 01/14/23 1129 01/14/23 1412  01/15/23 0717 01/16/23 0708 01/17/23 0346  WBC 6.5  --   --  6.5 8.4 8.4  NEUTROABS 5.3  --   --   --   --   --   HGB 9.4* 10.5* 10.2* 10.2* 9.9* 9.3*  HCT 30.3* 31.0* 30.0* 31.5* 30.1* 28.7*  MCV 91.0  --   --  88.5 86.5 88.0  PLT 271  --   --  277 287 285     Basic Metabolic Panel: Recent Labs  Lab 01/14/23 1120 01/14/23 1129 01/14/23 1412 01/15/23 0717 01/16/23 0708  NA 128* 128* 129* 131* 131*  K 4.7 4.7 4.1 3.5 3.5  CL 98 105  --  101 101  CO2 13*  --   --  19* 19*  GLUCOSE 192* 192*  --  202* 302*  BUN 40* 47*  --  16 19  CREATININE 1.29* 1.20  --  0.95 0.98  CALCIUM 9.3  --   --  8.6* 8.9     GFR: Estimated Creatinine Clearance: 40.7 mL/min (by C-G formula based on SCr of 0.98 mg/dL).  Liver Function Tests: Recent Labs  Lab 01/14/23 1120 01/15/23 0717 01/16/23 0708  AST 37 29 32  ALT 17 19 22   ALKPHOS 64 55 73  BILITOT 0.9 0.5 0.6  PROT 6.7 6.2* 6.2*  ALBUMIN 3.6 3.3* 3.3*    Recent Labs  Lab 01/14/23 1836  LIPASE 22    No results for input(s): "AMMONIA" in the last 168 hours.  Coagulation Profile: Recent Labs  Lab 01/14/23 1120  INR 1.2     Cardiac Enzymes: No results for input(s): "CKTOTAL", "CKMB", "CKMBINDEX", "TROPONINI" in the last 168 hours.  BNP (last 3 results) No results for input(s): "PROBNP" in the last 8760 hours.  Lipid Profile: Recent Labs    01/15/23 0428  CHOL 127  HDL 28*  LDLCALC 63  TRIG 161*  CHOLHDL 4.5     Thyroid Function Tests: No results for input(s): "TSH", "T4TOTAL", "FREET4", "T3FREE", "THYROIDAB" in the last 72 hours.  Anemia Panel: No results for input(s): "VITAMINB12", "FOLATE", "FERRITIN", "TIBC", "IRON", "RETICCTPCT" in the last 72 hours.  Urine analysis:    Component Value Date/Time   COLORURINE YELLOW 01/14/2023 1059   APPEARANCEUR CLEAR 01/14/2023 1059   LABSPEC 1.017 01/14/2023 1059   PHURINE 5.0 01/14/2023 1059   GLUCOSEU NEGATIVE 01/14/2023 1059   HGBUR SMALL (A) 01/14/2023  1059   BILIRUBINUR NEGATIVE 01/14/2023 1059   KETONESUR 5 (A) 01/14/2023 1059   PROTEINUR 100 (A) 01/14/2023 1059   UROBILINOGEN 0.2 07/07/2015 1842   NITRITE NEGATIVE 01/14/2023 1059   LEUKOCYTESUR NEGATIVE 01/14/2023 1059    Sepsis Labs: Lactic Acid, Venous    Component Value Date/Time   LATICACIDVEN 1.6 01/10/2022 1854    MICROBIOLOGY: No results found for this or any previous visit (from the past 240 hour(s)).  RADIOLOGY STUDIES/RESULTS: MR ABDOMEN MRCP W WO CONTAST  Result Date: 01/15/2023 CLINICAL DATA:  Cholelithiasis EXAM: MRI ABDOMEN WITHOUT AND WITH CONTRAST (INCLUDING MRCP) TECHNIQUE: Multiplanar multisequence MR imaging of the abdomen was performed both before and after the administration of intravenous contrast. Heavily T2-weighted images of the biliary and pancreatic ducts were obtained, and three-dimensional MRCP images were rendered by post processing. CONTRAST:  4mL GADAVIST GADOBUTROL 1 MMOL/ML IV SOLN COMPARISON:  CT abdomen pelvis, 01/14/2023 FINDINGS: Examination is generally limited by breath motion artifact. Lower chest: Heterogeneous airspace opacity of the bilateral lung bases. Hepatobiliary: No solid liver abnormality is seen. Gallstones and sludge in the dependent gallbladder. No gallbladder wall thickening. Severe intra and extrahepatic biliary ductal dilatation, common bile duct measuring up to 1.8 cm. Tiny gallstones and or sludge within the cystic duct (series 4, image 17) and likely near the ampulla (series 2, image 15, series 3, image 23). Pancreas: Unremarkable. No pancreatic ductal dilatation or surrounding inflammatory changes. Spleen: Normal in size without significant abnormality. Adrenals/Urinary Tract: Adrenal glands are unremarkable. Kidneys are normal, without renal calculi, solid lesion, or hydronephrosis. Stomach/Bowel: Stomach is within normal limits. No evidence of bowel wall thickening, distention, or inflammatory changes. Vascular/Lymphatic:  Aortic atherosclerosis. No enlarged abdominal lymph nodes. Other: No abdominal wall hernia or abnormality. No ascites. Musculoskeletal: No acute or significant osseous findings. IMPRESSION: 1. Severe intra and extrahepatic biliary ductal dilatation, common bile duct measuring up to 1.8 cm. Tiny gallstones and or sludge within the cystic duct and likely near the ampulla. 2. Gallstones and sludge in the  dependent gallbladder. No gallbladder wall thickening. 3. Heterogeneous airspace opacity of the bilateral lung bases. Electronically Signed   By: Jearld Lesch M.D.   On: 01/15/2023 23:06   MR LUMBAR SPINE WO CONTRAST  Result Date: 01/15/2023 CLINICAL DATA:  Low back pain EXAM: MRI LUMBAR SPINE WITHOUT CONTRAST TECHNIQUE: Multiplanar, multisequence MR imaging of the lumbar spine was performed. No intravenous contrast was administered. COMPARISON:  None Available. FINDINGS: Segmentation:  Standard. Alignment:  Physiologic. Vertebrae:  No fracture, evidence of discitis, or bone lesion. Conus medullaris and cauda equina: Conus extends to the L1 level. Conus and cauda equina appear normal. Paraspinal and other soft tissues: Negative. Disc levels: L1-L2: Normal disc space and facet joints. No spinal canal stenosis. No neural foraminal stenosis. L2-L3: Small disc bulge. No spinal canal stenosis. No neural foraminal stenosis. L3-L4: Intermediate sized right subarticular disc osteophyte complex narrowing the right lateral recess and displacing the right L4 nerve root. No central spinal canal stenosis. No neural foraminal stenosis. L4-L5: Small left subarticular disc extrusion with inferior migration narrowing the left lateral recess. No spinal canal stenosis. No neural foraminal stenosis. L5-S1: Small disc bulge with mild facet hypertrophy. No spinal canal stenosis. No neural foraminal stenosis. Visualized sacrum: Normal. IMPRESSION: 1. Intermediate sized right subarticular disc osteophyte complex at L3-L4 narrowing the  right lateral recess and displacing the right L4 nerve root. Correlate for right L4 radiculopathy. 2. Small left subarticular disc extrusion with inferior migration at L4-L5 narrowing the left lateral recess. Correlate for left L5 radiculopathy. 3. No spinal canal or neural foraminal stenosis. Electronically Signed   By: Deatra Robinson M.D.   On: 01/15/2023 22:54   ECHOCARDIOGRAM COMPLETE  Result Date: 01/15/2023    ECHOCARDIOGRAM REPORT   Patient Name:   LUTE SCHMIEDER Date of Exam: 01/15/2023 Medical Rec #:  [id number]      Height:       61.0 in Accession #:    [id number]     Weight:       95.9 lb Date of Birth:  28-Feb-1951      BSA:          1.382 m Patient Age:    72 years       BP:           178/70 mmHg Patient Gender: M              HR:           92 bpm. Exam Location:  Inpatient Procedure: 2D Echo, Color Doppler, Cardiac Doppler and Intracardiac            Opacification Agent Indications:    TIA  History:        Patient has prior history of Echocardiogram examinations. CAD,                 Prior CABG, COPD; Risk Factors:Hypertension, Diabetes and                 Dyslipidemia.  Sonographer:    Mike Gip Referring Phys: [id number] JENNIFER YATES IMPRESSIONS  1. Technically difficult study with limited visualizaiton of cardiac structures.  2. Left ventricular ejection fraction, by estimation, is 50 to 55%. The left ventricle has low normal function. The left ventricle demonstrates regional wall motion abnormalities (see scoring diagram/findings for description). The LV apex is dyskinetic and aneurysmal.  3. Right ventricular systolic function is normal. The right ventricular size is normal.  4. The mitral valve is normal in structure. No evidence  of mitral valve regurgitation. No evidence of mitral stenosis.  5. The aortic valve is normal in structure. There is mild calcification of the aortic valve. Aortic valve regurgitation is not visualized. No aortic stenosis is present.  6. The inferior vena cava is normal  in size with greater than 50% respiratory variability, suggesting right atrial pressure of 3 mmHg. FINDINGS  Left Ventricle: Left ventricular ejection fraction, by estimation, is 50 to 55%. The left ventricle has low normal function. The left ventricle demonstrates regional wall motion abnormalities. Definity contrast agent was given IV to delineate the left ventricular endocardial borders. The left ventricular internal cavity size was normal in size. There is no left ventricular hypertrophy. Left ventricular diastolic parameters are consistent with Grade I diastolic dysfunction (impaired relaxation).  LV Wall Scoring: The apex is dyskinetic. Right Ventricle: The right ventricular size is normal. No increase in right ventricular wall thickness. Right ventricular systolic function is normal. Left Atrium: Left atrial size was normal in size. Right Atrium: Right atrial size was normal in size. Pericardium: There is no evidence of pericardial effusion. Mitral Valve: The mitral valve is normal in structure. No evidence of mitral valve regurgitation. No evidence of mitral valve stenosis. Tricuspid Valve: The tricuspid valve is normal in structure. Tricuspid valve regurgitation is not demonstrated. No evidence of tricuspid stenosis. Aortic Valve: The aortic valve is normal in structure. There is mild calcification of the aortic valve. Aortic valve regurgitation is not visualized. No aortic stenosis is present. Pulmonic Valve: The pulmonic valve was normal in structure. Pulmonic valve regurgitation is not visualized. No evidence of pulmonic stenosis. Aorta: The aortic root is normal in size and structure. Venous: The inferior vena cava is normal in size with greater than 50% respiratory variability, suggesting right atrial pressure of 3 mmHg. IAS/Shunts: No atrial level shunt detected by color flow Doppler.  LEFT VENTRICLE PLAX 2D LVIDd:         3.60 cm     Diastology LVIDs:         3.00 cm     LV e' medial:    5.66 cm/s LV  PW:         0.70 cm     LV E/e' medial:  10.1 LV IVS:        0.70 cm     LV e' lateral:   9.03 cm/s LVOT diam:     1.80 cm     LV E/e' lateral: 6.3 LV SV:         36 LV SV Index:   26 LVOT Area:     2.54 cm  LV Volumes (MOD) LV vol d, MOD A2C: 55.0 ml LV vol d, MOD A4C: 64.9 ml LV vol s, MOD A2C: 22.2 ml LV vol s, MOD A4C: 27.6 ml LV SV MOD A2C:     32.8 ml LV SV MOD A4C:     64.9 ml LV SV MOD BP:      36.9 ml RIGHT VENTRICLE            IVC RV Basal diam:  2.50 cm    IVC diam: 1.10 cm RV S prime:     8.92 cm/s TAPSE (M-mode): 1.3 cm LEFT ATRIUM           Index        RIGHT ATRIUM          Index LA diam:      2.90 cm 2.10 cm/m   RA Area:  7.08 cm LA Vol (A2C): 26.2 ml 18.96 ml/m  RA Volume:   9.15 ml  6.62 ml/m LA Vol (A4C): 22.9 ml 16.57 ml/m  AORTIC VALVE LVOT Vmax:   69.20 cm/s LVOT Vmean:  44.900 cm/s LVOT VTI:    0.143 m  AORTA Ao Root diam: 3.10 cm Ao Asc diam:  2.30 cm MITRAL VALVE MV Area (PHT): 4.26 cm    SHUNTS MV Decel Time: 178 msec    Systemic VTI:  0.14 m MV E velocity: 57.00 cm/s  Systemic Diam: 1.80 cm MV A velocity: 90.40 cm/s MV E/A ratio:  0.63 Aditya Sabharwal Electronically signed by Dorthula Nettles Signature Date/Time: 01/15/2023/2:06:35 PM    Final      LOS: 2 days   [name] Massed, MD  [facility]    To contact the attending provider between 7A-7P or the covering provider during after hours 7P-7A, please log into the web site [email or web address] and access using universal [place] password for that web site. If you do not have the password, please call the hospital operator.  01/17/2023, 12:48 PM

## 2023-01-18 DIAGNOSIS — E8729 Other acidosis: Secondary | ICD-10-CM | POA: Diagnosis not present

## 2023-01-18 DIAGNOSIS — I739 Peripheral vascular disease, unspecified: Secondary | ICD-10-CM

## 2023-01-18 DIAGNOSIS — J449 Chronic obstructive pulmonary disease, unspecified: Secondary | ICD-10-CM | POA: Diagnosis not present

## 2023-01-18 DIAGNOSIS — I10 Essential (primary) hypertension: Secondary | ICD-10-CM | POA: Diagnosis not present

## 2023-01-18 LAB — COMPREHENSIVE METABOLIC PANEL
ALT: 19 U/L (ref 0–44)
AST: 29 U/L (ref 15–41)
Albumin: 2.9 g/dL — ABNORMAL LOW (ref 3.5–5.0)
Alkaline Phosphatase: 47 U/L (ref 38–126)
Anion gap: 9 (ref 5–15)
BUN: 12 mg/dL (ref 8–23)
CO2: 20 mmol/L — ABNORMAL LOW (ref 22–32)
Calcium: 8.3 mg/dL — ABNORMAL LOW (ref 8.9–10.3)
Chloride: 99 mmol/L (ref 98–111)
Creatinine, Ser: 0.92 mg/dL (ref 0.61–1.24)
GFR, Estimated: >60 mL/min (ref >60)
Glucose, Bld: 173 mg/dL — ABNORMAL HIGH (ref 70–99)
Potassium: 3.9 mmol/L (ref 3.5–5.1)
Sodium: 128 mmol/L — ABNORMAL LOW (ref 135–145)
Total Bilirubin: 0.2 mg/dL — ABNORMAL LOW (ref 0.3–1.2)
Total Protein: 5.9 g/dL — ABNORMAL LOW (ref 6.5–8.1)

## 2023-01-18 LAB — GLUCOSE, CAPILLARY
Glucose-Capillary: 198 mg/dL — ABNORMAL HIGH (ref 70–99)
Glucose-Capillary: 360 mg/dL — ABNORMAL HIGH (ref 70–99)
Glucose-Capillary: 317 mg/dL — ABNORMAL HIGH (ref 70–99)
Glucose-Capillary: 234 mg/dL — ABNORMAL HIGH (ref 70–99)

## 2023-01-18 LAB — CBC
HCT: 30.3 % — ABNORMAL LOW (ref 39.0–52.0)
Hemoglobin: 9.5 g/dL — ABNORMAL LOW (ref 13.0–17.0)
MCH: 28.4 pg (ref 26.0–34.0)
MCHC: 31.4 g/dL (ref 30.0–36.0)
MCV: 90.7 fL (ref 80.0–100.0)
Platelets: 293 10*3/uL (ref 150–400)
RBC: 3.34 MIL/uL — ABNORMAL LOW (ref 4.22–5.81)
RDW: 14.5 % (ref 11.5–15.5)
WBC: 9.5 10*3/uL (ref 4.0–10.5)
nRBC: 0 % (ref 0.0–0.2)

## 2023-01-18 LAB — APTT: aPTT: 98 seconds — ABNORMAL HIGH (ref 24–36)

## 2023-01-18 LAB — HEPARIN LEVEL (UNFRACTIONATED): Heparin Unfractionated: 0.91 IU/mL — ABNORMAL HIGH (ref 0.30–0.70)

## 2023-01-18 NOTE — Progress Notes (Signed)
ANTICOAGULATION CONSULT NOTE - Follow Up Consult  Pharmacy Consult for heparin Indication: atrial fibrillation in setting of possible ERCP this week   Allergies  Allergen Reactions   Actos [Pioglitazone] Shortness Of Breath    Leg swelling    Lipitor [Atorvastatin] Other (See Comments)    Leg pain    Zestril [Lisinopril] Cough   Patient Measurements: Height: 5\' 1"  (154.9 cm) Weight: 43.5 kg (95 lb 14.4 oz) IBW/kg (Calculated) : 52.3 kg Heparin Dosing Weight: 43.5 kg  Vital Signs: Temp: 98 F (36.7 C) (05/27 0546) Temp Source: Oral (05/27 0546) BP: 174/79 (05/27 0546) Pulse Rate: 102 (05/27 0546)  Labs: Recent Labs    01/16/23 0708 01/16/23 0708 01/16/23 1732 01/17/23 0346 01/17/23 1342 01/17/23 2140 01/18/23 0345  HGB 9.9*  --   --  9.3*  --   --  9.5*  HCT 30.1*  --   --  28.7*  --   --  30.3*  PLT 287  --   --  285  --   --  293  APTT  --    < > 44* 51* 82* 86* 98*  HEPARINUNFRC  --   --  >1.10* >1.10*  --   --  0.91*  CREATININE 0.98  --   --   --   --   --  0.92   < > = values in this interval not displayed.    Estimated Creatinine Clearance: 43.3 mL/min (by C-G formula based on SCr of 0.92 mg/dL).  Medications:  Infusions:   heparin 900 Units/hr (01/17/23 1610)   Assessment: 72 YO M admitted with PMH of atrial fibrillation on Eliquis prior to admission. Pharmacy consulted to transition to heparin in setting to possible ERCP. Last dose of apixaban 5/24 at 2100. Will monitor aPTT until correlating with heparin level. aPTT today is therapeutic at 98, on 900 units/hr. Hgb 9.5, plt 293. No line issues or signs/symptoms of bleeding noted.   Goal of Therapy:  Heparin level 0.3-0.7 units/ml aPTT 66-102 seconds Monitor platelets by anticoagulation protocol: Yes   Plan:  Continue heparin 900 units/hr. Daily CBC, aPTT, heparin level. Monitor for signs/symptoms of bleeding.  Brach Birdsall 01/18/2023,7:41 AM

## 2023-01-18 NOTE — Progress Notes (Signed)
PROGRESS NOTE        PATIENT DETAILS Name: Reginald Ayers Age: 72 y.o. Sex: male Date of Birth: [birth year] Admit Date: 01/14/2023 Admitting Physician Jonah Blue, MD ZOX:[id number], Selena Batten, NP  Brief Summary: Patient is a 72 y.o.  male COPD, CAD s/p CABG, PAD s/p stent, HLD, HTN-who presented with weakness.  Per H&P-patient was found at home with bedbugs, and covered in fecal matter.   Significant events: 5/23>> admit to [facility]  Significant studies: 5/23>> MRI brain: No acute abnormality-chronically occluded left ICA 5/23>> CT abdomen/pelvis: Mild groundglass density in the right lower lobe, distended gallbladder  without calcified stone, dilated CBD measuring up to 19 mm with suspicion for intrahepatic dilatation. 5/24>> MRCP:Severe intrahepatic/extrahepatic biliary ductal dilatation-tiny gallstones/sludge within the cystic duct near the ampulla.  Significant microbiology data: None  Procedures: None  Consults: None  Subjective: No major issues overnight-lying comfortably in bed.  Agreeable for ERCP tomorrow.  Chronic bilateral foot pain is unchanged-this has been ongoing for years.  Objective: Vitals: Blood pressure (!) 142/82, pulse (!) 108, temperature 98.8 F (37.1 C), temperature source Oral, resp. rate 17, height 5\' 1"  (1.549 m), weight 43.5 kg, SpO2 94 %.   Exam: Gen Exam:Alert awake-not in any distress HEENT:atraumatic, normocephalic Chest: B/L clear to auscultation anteriorly CVS:S1S2 regular Abdomen:soft non tender, non distended Extremities:no edema Neurology: Non focal Skin: no rash  Pertinent Labs/Radiology:    Latest Ref Rng & Units 01/18/2023    3:45 AM 01/17/2023    3:46 AM 01/16/2023    7:08 AM  CBC  WBC 4.0 - 10.5 K/uL 9.5  8.4  8.4   Hemoglobin 13.0 - 17.0 g/dL 9.5  9.3  9.9   Hematocrit 39.0 - 52.0 % 30.3  28.7  30.1   Platelets 150 - 400 K/uL 293  285  287     Lab Results  Component Value Date   NA 128 (L) 01/18/2023    K 3.9 01/18/2023   CL 99 01/18/2023   CO2 20 (L) 01/18/2023      Assessment/Plan: Weakness Poor historian-unclear etiology-given MRCP findings-concerned that his debility/deconditioning-concerned that his clinical presentation on admission could have been due to a passed CBD stone/symptomatic choledocholithiasis..    Anion gap metabolic acidosis Likely starvation ketoacidosis-due to poor intake/possible vomiting-in the setting of possible passed CBD stone. Improving with supportive care  Dilated common bile duct/intrahepatic dilatation likely secondary to choledocholithiasis Although he is a poor historian-suspect his presenting complaints are likely due to symptomatic choledocholithiasis-he may have passed a CBD stone. ERCP is tentatively scheduled for 5/28.    HTN BP better controlled with amlodipine/metoprolol.  CAD-s/p CABG No anginal symptoms See below regarding antiplatelets. Continue statin/beta-blocker  History of CVA No obvious deficits on exam-MRI brain negative Continue statin See below regarding antiplatelets.  PAF Sinus rhythm Eliquis held on 5/25-for potential ERCP on Tuesday-will place on IV heparin in the interim.  PAD Has had numerous procedures in the past-follow with Dr. Myra Gianotti Prior to this hospitalization he was on aspirin/Plavix/Eliquis Discussed with Dr. Myra Gianotti on 5/24-okay to stop Plavix (as he is on Eliquis) Remains on aspirin/statin  DM-2 CBGs relatively stable Semglee 10 units added on 5/25-will give it a few more days before adjusting Continue SSI.  Continue to hold metformin-May need to consider stopping given metabolic acidosis on presentation.   Recent Labs    01/17/23 1547  01/17/23 2056 01/18/23 0827  GLUCAP 330* 139* 198*    Hyponatremia Mild Stopping IV fluid to see if this will improve. Follow  COPD Stable Bronchodilators  BMI: Estimated body mass index is 18.12 kg/m as calculated from the following:   Height  as of this encounter: 5\' 1"  (1.549 m).   Weight as of this encounter: 43.5 kg.   Code status:   Code Status: DNR   DVT Prophylaxis: IV heparin gtt   Family Communication: Niece-[name] [phone] VM on 5/25, 5/26,5/27   Disposition Plan: Status is: Observation The patient will require care spanning > 2 midnights and should be moved to inpatient because: Severity of illness.   Planned Discharge Destination:Home health   Diet: Diet Order             Diet regular Room service appropriate? Yes with Assist; Fluid consistency: Thin  Diet effective now                     Antimicrobial agents: Anti-infectives (From admission, onward)    Start     Dose/Rate Route Frequency Ordered Stop   01/14/23 1745  cefTRIAXone (ROCEPHIN) 1 g in sodium chloride 0.9 % 100 mL IVPB        1 g 200 mL/hr over 30 Minutes Intravenous  Once 01/14/23 1741 01/14/23 1922   01/14/23 1745  azithromycin (ZITHROMAX) 500 mg in sodium chloride 0.9 % 250 mL IVPB        500 mg 250 mL/hr over 60 Minutes Intravenous  Once 01/14/23 1741 01/14/23 2315        MEDICATIONS: Scheduled Meds:   stroke: early stages of recovery book   Does not apply Once   amLODipine  5 mg Oral Daily   aspirin EC  81 mg Oral Daily   feeding supplement  237 mL Oral BID BM   gabapentin  200 mg Oral BID   insulin aspart  0-15 Units Subcutaneous TID WC   insulin aspart  0-5 Units Subcutaneous QHS   insulin glargine-yfgn  10 Units Subcutaneous Daily   metoprolol succinate  25 mg Oral Daily   multivitamin with minerals  1 tablet Oral Daily   pantoprazole  40 mg Oral Daily   rosuvastatin  20 mg Oral Daily   umeclidinium bromide  1 puff Inhalation QHS   Continuous Infusions:  heparin 900 Units/hr (01/17/23 0648)   PRN Meds:.acetaminophen **OR** acetaminophen (TYLENOL) oral liquid 160 mg/5 mL **OR** acetaminophen, albuterol, hydrALAZINE, ondansetron (ZOFRAN) IV   I have personally reviewed following labs and  imaging studies  LABORATORY DATA: CBC: Recent Labs  Lab 01/14/23 1120 01/14/23 1129 01/14/23 1412 01/15/23 0717 01/16/23 0708 01/17/23 0346 01/18/23 0345  WBC 6.5  --   --  6.5 8.4 8.4 9.5  NEUTROABS 5.3  --   --   --   --   --   --   HGB 9.4*   < > 10.2* 10.2* 9.9* 9.3* 9.5*  HCT 30.3*   < > 30.0* 31.5* 30.1* 28.7* 30.3*  MCV 91.0  --   --  88.5 86.5 88.0 90.7  PLT 271  --   --  277 287 285 293   < > = values in this interval not displayed.     Basic Metabolic Panel: Recent Labs  Lab 01/14/23 1120 01/14/23 1129 01/14/23 1412 01/15/23 0717 01/16/23 0708 01/18/23 0345  NA 128* 128* 129* 131* 131* 128*  K 4.7 4.7 4.1 3.5 3.5 3.9  CL 98 105  --  101 101 99  CO2 13*  --   --  19* 19* 20*  GLUCOSE 192* 192*  --  202* 302* 173*  BUN 40* 47*  --  16 19 12   CREATININE 1.29* 1.20  --  0.95 0.98 0.92  CALCIUM 9.3  --   --  8.6* 8.9 8.3*     GFR: Estimated Creatinine Clearance: 43.3 mL/min (by C-G formula based on SCr of 0.92 mg/dL).  Liver Function Tests: Recent Labs  Lab 01/14/23 1120 01/15/23 0717 01/16/23 0708 01/18/23 0345  AST 37 29 32 29  ALT 17 19 22 19   ALKPHOS 64 55 73 47  BILITOT 0.9 0.5 0.6 0.2*  PROT 6.7 6.2* 6.2* 5.9*  ALBUMIN 3.6 3.3* 3.3* 2.9*    Recent Labs  Lab 01/14/23 1836  LIPASE 22    No results for input(s): "AMMONIA" in the last 168 hours.  Coagulation Profile: Recent Labs  Lab 01/14/23 1120  INR 1.2     Cardiac Enzymes: No results for input(s): "CKTOTAL", "CKMB", "CKMBINDEX", "TROPONINI" in the last 168 hours.  BNP (last 3 results) No results for input(s): "PROBNP" in the last 8760 hours.  Lipid Profile: No results for input(s): "CHOL", "HDL", "LDLCALC", "TRIG", "CHOLHDL", "LDLDIRECT" in the last 72 hours.   Thyroid Function Tests: No results for input(s): "TSH", "T4TOTAL", "FREET4", "T3FREE", "THYROIDAB" in the last 72 hours.  Anemia Panel: No results for input(s): "VITAMINB12", "FOLATE", "FERRITIN", "TIBC",  "IRON", "RETICCTPCT" in the last 72 hours.  Urine analysis:    Component Value Date/Time   COLORURINE YELLOW 01/14/2023 1059   APPEARANCEUR CLEAR 01/14/2023 1059   LABSPEC 1.017 01/14/2023 1059   PHURINE 5.0 01/14/2023 1059   GLUCOSEU NEGATIVE 01/14/2023 1059   HGBUR SMALL (A) 01/14/2023 1059   BILIRUBINUR NEGATIVE 01/14/2023 1059   KETONESUR 5 (A) 01/14/2023 1059   PROTEINUR 100 (A) 01/14/2023 1059   UROBILINOGEN 0.2 07/07/2015 1842   NITRITE NEGATIVE 01/14/2023 1059   LEUKOCYTESUR NEGATIVE 01/14/2023 1059    Sepsis Labs: Lactic Acid, Venous    Component Value Date/Time   LATICACIDVEN 1.6 01/10/2022 1854    MICROBIOLOGY: No results found for this or any previous visit (from the past 240 hour(s)).  RADIOLOGY STUDIES/RESULTS: No results found.   LOS: 3 days   [name] Massed, MD  [facility]    To contact the attending provider between 7A-7P or the covering provider during after hours 7P-7A, please log into the web site [email or web address] and access using universal [place] password for that web site. If you do not have the password, please call the hospital operator.  01/18/2023, 11:29 AM

## 2023-01-18 NOTE — Progress Notes (Signed)
Reginald Ayers 12:33 PM  Subjective: Patient doing well without any new complaints and no GI complaints and we rediscussed his procedure tomorrow  Objective: Vital signs stable afebrile abdomen is soft nontender labs stable  Assessment: Abnormal MRCP worrisome for CBD stones  Plan: Okay to proceed with ERCP tomorrow at 1 PM with anesthesia assistance and the procedure was rediscussed with him including the success rate and please make sure heparin is turned off at 7 AM  [facility]  office [phone] After 5PM or if no answer call [phone]

## 2023-01-18 NOTE — Progress Notes (Signed)
Occupational Therapy Treatment Patient Details Name: Reginald Ayers MRN: [id number] DOB: 12-Dec-1950 Today's Date: 01/18/2023   History of present illness 72 y/o M admitted to [facility] on 5/23 for weakness and pain in his RLE, pt found in fecal matter and bedbugs. MRI of brain with no acute abnormality, chronically occluded left ICA. CT of abdomen/pelvis with mild groundglass denisty in the RLL, distended gallbladder without calcified stone, dilated CBD with suspicion for intrahepatic dilatation. PMHx: COPD, CAD s/p CABG, DM, HTN, HLD, PAD s/p stent, tobacco dependence.   OT comments  Pt is making steady progress towards their acute OT goals. Upon arrival pt was initially declining therapy however he was agreeable to a limited bed-side session with encouragement. Overall he continues to need mod A for bed mobility and mod A for basic STS transfer. Pt tolerated standing for ~2 minutes while marching in place with RW - min A needed for balance and posterior bias. OT to continue to follow acutely to facilitate progress towards established goals. Pt will continue to benefit from skilled inpatient follow up therapy, <3 hours/day.    Recommendations for follow up therapy are one component of a multi-disciplinary discharge planning process, led by the attending physician.  Recommendations may be updated based on patient status, additional functional criteria and insurance authorization.    Assistance Recommended at Discharge Frequent or constant Supervision/Assistance  Patient can return home with the following  A little help with walking and/or transfers;A lot of help with bathing/dressing/bathroom;Assistance with cooking/housework;Direct supervision/assist for medications management;Direct supervision/assist for financial management;Assist for transportation;Help with stairs or ramp for entrance   Equipment Recommendations  Other (comment)       Precautions / Restrictions Precautions Precautions:  Fall Precaution Comments: bed bugs Restrictions Weight Bearing Restrictions: No       Mobility Bed Mobility Overal bed mobility: Needs Assistance Bed Mobility: Supine to Sit, Sit to Supine     Supine to sit: Mod assist Sit to supine: Mod assist        Transfers Overall transfer level: Needs assistance Equipment used: Rolling walker (2 wheels) Transfers: Sit to/from Stand Sit to Stand: Mod assist           General transfer comment: Tolerated standing 2 minutes with 10 marches 2x     Balance Overall balance assessment: Needs assistance Sitting-balance support: Feet supported Sitting balance-Leahy Scale: Fair   Postural control: Posterior lean Standing balance support: Bilateral upper extremity supported, Reliant on assistive device for balance, During functional activity Standing balance-Leahy Scale: Poor Standing balance comment: reliant on RW and intermittent external support for balance                           ADL either performed or assessed with clinical judgement   ADL Overall ADL's : Needs assistance/impaired                         Toilet Transfer: Moderate assistance;Stand-pivot;BSC/3in1;Rolling walker (2 wheels) Toilet Transfer Details (indicate cue type and reason): limited by RLE pain. simulated         Functional mobility during ADLs: Moderate assistance;Cueing for safety;Cueing for sequencing;Rolling walker (2 wheels) General ADL Comments: limited session due to RLE pain and pt declining ADLs    Extremity/Trunk Assessment Upper Extremity Assessment Upper Extremity Assessment: Generalized weakness RUE Deficits / Details: Gernealized weakness, AROM/PROM shoulder flextion to approx. 90 degrees RUE Sensation: WNL RUE Coordination: decreased fine motor;decreased gross motor LUE  Deficits / Details: generalized weakness, AROM/PROM shoulder flexion to approx. 90 degrees LUE Sensation: WNL LUE Coordination: decreased fine  motor;decreased gross motor   Lower Extremity Assessment Lower Extremity Assessment: Defer to PT evaluation        Vision   Vision Assessment?: No apparent visual deficits Additional Comments: wears glasses   Perception Perception Perception: Not tested   Praxis Praxis Praxis: Not tested    Cognition Arousal/Alertness: Awake/alert Behavior During Therapy: WFL for tasks assessed/performed Overall Cognitive Status: No family/caregiver present to determine baseline cognitive functioning                                 General Comments: A&Ox4, follows 1 step commands. Initially declining to participate but agreeable after encouragement. Would benefit from higher level assessment              General Comments VSS on RA, RLE red and inflammed    Pertinent Vitals/ Pain       Pain Assessment Pain Assessment: Faces Faces Pain Scale: Hurts little more Pain Location: RLE Pain Descriptors / Indicators: Sore Pain Intervention(s): Monitored during session, Limited activity within patient's tolerance   Frequency  Min 2X/week        Progress Toward Goals  OT Goals(current goals can now be found in the care plan section)  Progress towards OT goals: Progressing toward goals  Acute Rehab OT Goals Patient Stated Goal: surgery tomorrow OT Goal Formulation: With patient Time For Goal Achievement: 01/29/23 Potential to Achieve Goals: Good ADL Goals Pt Will Perform Lower Body Bathing: with min guard assist;sit to/from stand Pt Will Perform Lower Body Dressing: with min guard assist;sit to/from stand Pt Will Transfer to Toilet: with modified independence;regular height toilet;ambulating Pt Will Perform Toileting - Clothing Manipulation and hygiene: with modified independence;sit to/from stand  Plan Discharge plan remains appropriate       AM-PAC OT "6 Clicks" Daily Activity     Outcome Measure   Help from another person eating meals?: None Help from another  person taking care of personal grooming?: A Little Help from another person toileting, which includes using toliet, bedpan, or urinal?: A Lot Help from another person bathing (including washing, rinsing, drying)?: A Lot Help from another person to put on and taking off regular upper body clothing?: A Little Help from another person to put on and taking off regular lower body clothing?: A Lot 6 Click Score: 16    End of Session Equipment Utilized During Treatment: Gait belt;Rolling walker (2 wheels)  OT Visit Diagnosis: Unsteadiness on feet (R26.81);Other abnormalities of gait and mobility (R26.89);History of falling (Z91.81);Muscle weakness (generalized) (M62.81);Other (comment)   Activity Tolerance Patient tolerated treatment well   Patient Left in chair;with call bell/phone within reach;with chair alarm set   Nurse Communication Mobility status        Time: 6213-0865 OT Time Calculation (min): 18 min  Charges: OT General Charges $OT Visit: 1 Visit OT Treatments $Therapeutic Activity: 8-22 mins  Derenda Mis, OTR/L Acute Rehabilitation Services Office [phone] Secure Chat Communication Preferred   Donia Pounds 01/18/2023, 4:22 PM

## 2023-01-18 NOTE — TOC Initial Note (Signed)
Transition of Care [facility]) - Initial/Assessment Note    Patient Details  Name: Reginald Ayers MRN: [id number] Date of Birth: 20-Feb-1951  [facility] of [facility]) CM/SW Contact:    Mearl Latin, LCSW Phone Number: 01/18/2023, 1:07 PM  Clinical Narrative:                 CSW left voicemail for patient's niece, [name] (went straight to voicemail). Number listed for roommate is not in service. Left voicemail for [place].   Expected Discharge Plan: Skilled Nursing Facility Barriers to Discharge: Continued Medical Work up, SNF Pending bed offer, Family Issues   Patient Goals and CMS Choice            Expected Discharge Plan and Services In-house Referral: Clinical Social Work     Living arrangements for the past 2 months: Boarding House                                      Prior Living Arrangements/Services Living arrangements for the past 2 months: [employer] Lives with:: Roommate Patient language and need for interpreter reviewed:: Yes Do you feel safe going back to the place where you live?: Yes      Need for Family Participation in Patient Care: Yes (Comment) Care giver support system in place?: No (comment) Current home services: DME (Home O2) Criminal Activity/Legal Involvement Pertinent to Current Situation/Hospitalization: No - Comment as needed  Activities of Daily Living      Permission Sought/Granted Permission sought to share information with : Facility Medical sales representative, Family Supports Permission granted to share information with : Yes, Verbal Permission Granted     Permission granted to share info w AGENCY: SNFs        Emotional Assessment Appearance:: Appears stated age Attitude/Demeanor/Rapport: Unable to Assess Affect (typically observed): Unable to Assess Orientation: :  (Disoriented x4) Alcohol / Substance Use: Not Applicable Psych Involvement: No (comment)  Admission diagnosis:  Slurred speech [R47.81] Right leg weakness  [R29.898] High anion gap metabolic acidosis [E87.29] Increased anion gap metabolic acidosis [E87.29] Patient Active Problem List   Diagnosis Date Noted   Increased anion gap metabolic acidosis 01/14/2023   DNR (do not resuscitate) 01/14/2023   Pain due to onychomycosis of toenails of both feet 09/14/2022   Medication monitoring encounter 01/29/2022   Screening of cancer 01/29/2022   Cerebral embolism with cerebral infarction 12/22/2021   Pressure injury of skin 12/21/2021   Acute respiratory failure with hypoxia (HCC) 12/20/2021   Severe protein-energy malnutrition (HCC) 12/20/2021   Acidosis, metabolic 12/20/2021   AKI (acute kidney injury) (HCC) 09/27/2021   Right sided weakness 09/27/2021   Stage 3a chronic kidney disease (HCC) 08/14/2019   HCAP (healthcare-associated pneumonia) 08/12/2017   GERD (gastroesophageal reflux disease) 07/30/2015   Tobacco abuse 12/06/2014   COPD (chronic obstructive pulmonary disease) (HCC)    Hx of CABG Feb 2010 08/21/2013   Peripheral arterial disease (HCC) 08/21/2013   Carotid artery disease (HCC) 08/21/2013   Diabetes (HCC) 08/21/2013   Essential hypertension 08/21/2013   Hyperlipidemia 08/21/2013   PCP:  Loura Back, NP Pharmacy:   [facility] [phone] [place] Otto, [place] - [address] AT [facility] OF [address] [place] [address] Phone: [phone] Fax: [phone]     Social Determinants of Health (SDOH) Social History: SDOH Screenings   Housing: Medium Risk (01/15/2023)  Tobacco  Use: High Risk (01/16/2023)   SDOH Interventions: Housing Interventions: Inpatient TOC   Readmission Risk Interventions    01/12/2022   10:29 AM  Readmission Risk Prevention Plan  Transportation Screening Complete  PCP or Specialist Appt within 3-5 Days Complete  HRI or Home Care Consult Complete  Social Work Consult for Recovery Care Planning/Counseling Complete  Palliative Care Screening Not  Applicable  Medication Review [employer]) Complete

## 2023-01-18 NOTE — [place] FL2 (Signed)
[place] MEDICAID FL2 LEVEL OF CARE FORM     IDENTIFICATION  Patient Name: Reginald Ayers Birthdate: [birth year] Sex: male Admission Date (Current Location): 01/14/2023  [facility] and [place] Number:  Producer, television/film/video and Address:  [facility] [place], [address], [place], [place] [address]      Provider Number: [id number]  Attending Physician Name and Address:  Maretta Bees, MD  Relative Name and Phone Number:       Current Level of Care: Hospital Recommended Level of Care: Skilled Nursing Facility Prior Approval Number:    Date Approved/Denied:   PASRR Number: [id number]  Discharge Plan: SNF    Current Diagnoses: Patient Active Problem List   Diagnosis Date Noted   Increased anion gap metabolic acidosis 01/14/2023   DNR (do not resuscitate) 01/14/2023   Pain due to onychomycosis of toenails of both feet 09/14/2022   Medication monitoring encounter 01/29/2022   Screening of cancer 01/29/2022   Cerebral embolism with cerebral infarction 12/22/2021   Pressure injury of skin 12/21/2021   Acute respiratory failure with hypoxia (HCC) 12/20/2021   Severe protein-energy malnutrition (HCC) 12/20/2021   Acidosis, metabolic 12/20/2021   AKI (acute kidney injury) (HCC) 09/27/2021   Right sided weakness 09/27/2021   Stage 3a chronic kidney disease (HCC) 08/14/2019   HCAP (healthcare-associated pneumonia) 08/12/2017   GERD (gastroesophageal reflux disease) 07/30/2015   Tobacco abuse 12/06/2014   COPD (chronic obstructive pulmonary disease) (HCC)    Hx of CABG Feb 2010 08/21/2013   Peripheral arterial disease (HCC) 08/21/2013   Carotid artery disease (HCC) 08/21/2013   Diabetes (HCC) 08/21/2013   Essential hypertension 08/21/2013   Hyperlipidemia 08/21/2013    Orientation RESPIRATION BLADDER Height & Weight      (Disoriented x4)  Normal Incontinent, External catheter Weight: 95 lb 14.4 oz (43.5 kg) Height:  5\' 1"  (154.9 cm)  BEHAVIORAL  SYMPTOMS/MOOD NEUROLOGICAL BOWEL NUTRITION STATUS      Continent Diet (See dc summary)  AMBULATORY STATUS COMMUNICATION OF NEEDS Skin   Limited Assist Verbally Normal                       Personal Care Assistance Level of Assistance  Bathing, Feeding, Dressing Bathing Assistance: Limited assistance Feeding assistance: Limited assistance Dressing Assistance: Limited assistance     Functional Limitations Info  Sight, Hearing Sight Info: Impaired Hearing Info: Impaired      SPECIAL CARE FACTORS FREQUENCY  PT (By licensed PT), OT (By licensed OT)     PT Frequency: 5x/week OT Frequency: 5x/week            Contractures Contractures Info: Not present    Additional Factors Info  Code Status, Allergies, Isolation Precautions, Insulin Sliding Scale Code Status Info: DNR Allergies Info: Actos (Pioglitazone), Lipitor (Atorvastatin), Zestril (Lisinopril)   Insulin Sliding Scale Info: See dc summary Isolation Precautions Info: MRSA     Current Medications (01/18/2023):  This is the current hospital active medication list Current Facility-Administered Medications  Medication Dose Route Frequency Provider Last Rate Last Admin    stroke: early stages of recovery book   Does not apply Once Jonah Blue, MD       acetaminophen (TYLENOL) tablet 650 mg  650 mg Oral Q4H PRN Jonah Blue, MD   650 mg at 01/17/23 0033   Or   acetaminophen (TYLENOL) 160 MG/5ML solution 650 mg  650 mg Per Tube Q4H PRN Jonah Blue, MD   650 mg at 01/16/23  2956   Or   acetaminophen (TYLENOL) suppository 650 mg  650 mg Rectal Q4H PRN Jonah Blue, MD       albuterol (PROVENTIL) (2.5 MG/3ML) 0.083% nebulizer solution 2.5 mg  2.5 mg Nebulization Q4H PRN Jonah Blue, MD       amLODipine (NORVASC) tablet 5 mg  5 mg Oral Daily Maretta Bees, MD   5 mg at 01/18/23 0844   aspirin EC tablet 81 mg  81 mg Oral Daily Jonah Blue, MD   81 mg at 01/18/23 0845   feeding supplement (ENSURE  ENLIVE / ENSURE PLUS) liquid 237 mL  237 mL Oral BID BM Maretta Bees, MD   237 mL at 01/18/23 0900   gabapentin (NEURONTIN) capsule 200 mg  200 mg Oral BID Maretta Bees, MD   200 mg at 01/18/23 0845   heparin ADULT infusion 100 units/mL (25000 units/214mL)  900 Units/hr Intravenous Continuous Smitty Cords, RPH 9 mL/hr at 01/17/23 0648 900 Units/hr at 01/17/23 0648   hydrALAZINE (APRESOLINE) injection 5 mg  5 mg Intravenous Q4H PRN Jonah Blue, MD   5 mg at 01/16/23 2130   insulin aspart (novoLOG) injection 0-15 Units  0-15 Units Subcutaneous TID [facility] Jonah Blue, MD   3 Units at 01/18/23 0846   insulin aspart (novoLOG) injection 0-5 Units  0-5 Units Subcutaneous QHS Jonah Blue, MD   2 Units at 01/14/23 2242   insulin glargine-yfgn [facility]) injection 10 Units  10 Units Subcutaneous Daily Maretta Bees, MD   10 Units at 01/18/23 0845   metoprolol succinate (TOPROL-XL) 24 hr tablet 25 mg  25 mg Oral Daily Jonah Blue, MD   25 mg at 01/18/23 0845   multivitamin with minerals tablet 1 tablet  1 tablet Oral Daily Maretta Bees, MD   1 tablet at 01/18/23 0845   ondansetron (ZOFRAN) injection 4 mg  4 mg Intravenous Q6H PRN Jonah Blue, MD       pantoprazole (PROTONIX) EC tablet 40 mg  40 mg Oral Daily Jonah Blue, MD   40 mg at 01/18/23 0844   rosuvastatin (CRESTOR) tablet 20 mg  20 mg Oral Daily Jonah Blue, MD   20 mg at 01/18/23 0845   umeclidinium bromide (INCRUSE ELLIPTA) 62.5 MCG/ACT 1 puff  1 puff Inhalation QHS Jonah Blue, MD   1 puff at 01/17/23 2039     Discharge Medications: Please see discharge summary for a list of discharge medications.  Relevant Imaging Results:  Relevant Lab Results:   Additional Information SSN: [id number].  Mearl Latin, LCSW

## 2023-01-19 ENCOUNTER — Inpatient Hospital Stay (HOSPITAL_COMMUNITY): Payer: Medicare Other | Admitting: Anesthesiology

## 2023-01-19 ENCOUNTER — Encounter (HOSPITAL_COMMUNITY)
Admission: EM | Disposition: A | Discharge status: Hospice | Payer: Self-pay | Source: Home / Self Care | Attending: Internal Medicine

## 2023-01-19 ENCOUNTER — Encounter (HOSPITAL_COMMUNITY): Payer: Self-pay | Admitting: Internal Medicine

## 2023-01-19 ENCOUNTER — Inpatient Hospital Stay (HOSPITAL_COMMUNITY): Payer: Medicare Other

## 2023-01-19 DIAGNOSIS — J449 Chronic obstructive pulmonary disease, unspecified: Secondary | ICD-10-CM | POA: Diagnosis not present

## 2023-01-19 DIAGNOSIS — I739 Peripheral vascular disease, unspecified: Secondary | ICD-10-CM | POA: Diagnosis not present

## 2023-01-19 DIAGNOSIS — I2510 Atherosclerotic heart disease of native coronary artery without angina pectoris: Secondary | ICD-10-CM | POA: Diagnosis not present

## 2023-01-19 DIAGNOSIS — I10 Essential (primary) hypertension: Secondary | ICD-10-CM | POA: Diagnosis not present

## 2023-01-19 DIAGNOSIS — K838 Other specified diseases of biliary tract: Secondary | ICD-10-CM

## 2023-01-19 DIAGNOSIS — E8729 Other acidosis: Secondary | ICD-10-CM | POA: Diagnosis not present

## 2023-01-19 DIAGNOSIS — Z87891 Personal history of nicotine dependence: Secondary | ICD-10-CM

## 2023-01-19 HISTORY — PX: REMOVAL OF STONES: SHX5545

## 2023-01-19 HISTORY — PX: ERCP: SHX5425

## 2023-01-19 HISTORY — PX: SPHINCTEROTOMY: SHX5544

## 2023-01-19 LAB — BASIC METABOLIC PANEL
Anion gap: 9 (ref 5–15)
BUN: 18 mg/dL (ref 8–23)
CO2: 23 mmol/L (ref 22–32)
Calcium: 7.6 mg/dL — ABNORMAL LOW (ref 8.9–10.3)
Chloride: 91 mmol/L — ABNORMAL LOW (ref 98–111)
Creatinine, Ser: 0.99 mg/dL (ref 0.61–1.24)
GFR, Estimated: >60 mL/min (ref >60)
Glucose, Bld: 229 mg/dL — ABNORMAL HIGH (ref 70–99)
Potassium: 3.8 mmol/L (ref 3.5–5.1)
Sodium: 123 mmol/L — ABNORMAL LOW (ref 135–145)

## 2023-01-19 LAB — CBC
HCT: 25.2 % — ABNORMAL LOW (ref 39.0–52.0)
Hemoglobin: 8.3 g/dL — ABNORMAL LOW (ref 13.0–17.0)
MCH: 29.4 pg (ref 26.0–34.0)
MCHC: 32.9 g/dL (ref 30.0–36.0)
MCV: 89.4 fL (ref 80.0–100.0)
Platelets: 261 10*3/uL (ref 150–400)
RBC: 2.82 MIL/uL — ABNORMAL LOW (ref 4.22–5.81)
RDW: 14.3 % (ref 11.5–15.5)
WBC: 10.3 10*3/uL (ref 4.0–10.5)
nRBC: 0 % (ref 0.0–0.2)

## 2023-01-19 LAB — GLUCOSE, CAPILLARY
Glucose-Capillary: 230 mg/dL — ABNORMAL HIGH (ref 70–99)
Glucose-Capillary: 298 mg/dL — ABNORMAL HIGH (ref 70–99)
Glucose-Capillary: 283 mg/dL — ABNORMAL HIGH (ref 70–99)

## 2023-01-19 LAB — HEPARIN LEVEL (UNFRACTIONATED): Heparin Unfractionated: 0.4 IU/mL (ref 0.30–0.70)

## 2023-01-19 LAB — OSMOLALITY: Osmolality: 279 mOsm/kg (ref 275–295)

## 2023-01-19 LAB — OSMOLALITY, URINE: Osmolality, Ur: 426 mOsm/kg (ref 300–900)

## 2023-01-19 LAB — APTT: aPTT: 79 seconds — ABNORMAL HIGH (ref 24–36)

## 2023-01-19 SURGERY — ERCP, WITH INTERVENTION IF INDICATED
Anesthesia: General

## 2023-01-19 MED ORDER — FENTANYL CITRATE (PF) 100 MCG/2ML IJ SOLN
INTRAMUSCULAR | Status: DC | PRN
  Administered 2023-01-19: 50 ug via INTRAVENOUS

## 2023-01-19 MED ORDER — ONDANSETRON HCL 4 MG/2ML IJ SOLN
INTRAMUSCULAR | Status: DC | PRN
  Administered 2023-01-19: 4 mg via INTRAVENOUS

## 2023-01-19 MED ORDER — GLUCAGON HCL RDNA (DIAGNOSTIC) 1 MG IJ SOLR
INTRAMUSCULAR | Status: AC
  Filled 2023-01-19: qty 1

## 2023-01-19 MED ORDER — ALUM & MAG HYDROXIDE-SIMETH 200-200-20 MG/5ML PO SUSP
30.0000 mL | ORAL | Status: DC | PRN
  Administered 2023-01-19: 30 mL via ORAL
  Filled 2023-01-19 (×3): qty 30

## 2023-01-19 MED ORDER — LIDOCAINE 2% (20 MG/ML) 5 ML SYRINGE
INTRAMUSCULAR | Status: DC | PRN
  Administered 2023-01-19: 60 mg via INTRAVENOUS

## 2023-01-19 MED ORDER — FENTANYL CITRATE (PF) 100 MCG/2ML IJ SOLN
INTRAMUSCULAR | Status: AC
  Filled 2023-01-19: qty 2

## 2023-01-19 MED ORDER — SUGAMMADEX SODIUM 200 MG/2ML IV SOLN
INTRAVENOUS | Status: DC | PRN
  Administered 2023-01-19: 200 mg via INTRAVENOUS

## 2023-01-19 MED ORDER — PROPOFOL 10 MG/ML IV BOLUS
INTRAVENOUS | Status: DC | PRN
  Administered 2023-01-19: 70 mg via INTRAVENOUS

## 2023-01-19 MED ORDER — PHENYLEPHRINE 80 MCG/ML (10ML) SYRINGE FOR IV PUSH (FOR BLOOD PRESSURE SUPPORT)
PREFILLED_SYRINGE | INTRAVENOUS | Status: DC | PRN
  Administered 2023-01-19 (×3): 120 ug via INTRAVENOUS

## 2023-01-19 MED ORDER — DICLOFENAC SUPPOSITORY 100 MG
RECTAL | Status: AC
  Filled 2023-01-19: qty 1

## 2023-01-19 MED ORDER — LACTATED RINGERS IV SOLN
INTRAVENOUS | Status: AC | PRN
  Administered 2023-01-19: 1000 mL via INTRAVENOUS

## 2023-01-19 MED ORDER — SODIUM CHLORIDE 0.9 % IV SOLN
INTRAVENOUS | Status: DC | PRN
  Administered 2023-01-19: 40 mL

## 2023-01-19 MED ORDER — DEXAMETHASONE SODIUM PHOSPHATE 10 MG/ML IJ SOLN
INTRAMUSCULAR | Status: DC | PRN
  Administered 2023-01-19: 4 mg via INTRAVENOUS

## 2023-01-19 MED ORDER — DICLOFENAC SUPPOSITORY 100 MG
RECTAL | Status: DC | PRN
  Administered 2023-01-19: 100 mg via RECTAL

## 2023-01-19 MED ORDER — CIPROFLOXACIN IN D5W 400 MG/200ML IV SOLN
400.0000 mg | Freq: Once | INTRAVENOUS | Status: AC
  Administered 2023-01-19: 400 mg via INTRAVENOUS
  Filled 2023-01-19: qty 200

## 2023-01-19 MED ORDER — ROCURONIUM BROMIDE 10 MG/ML (PF) SYRINGE
PREFILLED_SYRINGE | INTRAVENOUS | Status: DC | PRN
  Administered 2023-01-19: 50 mg via INTRAVENOUS

## 2023-01-19 MED ORDER — PHENYLEPHRINE HCL-NACL 20-0.9 MG/250ML-% IV SOLN
INTRAVENOUS | Status: DC | PRN
  Administered 2023-01-19: 40 ug/min via INTRAVENOUS

## 2023-01-19 MED ORDER — HEPARIN (PORCINE) 25000 UT/250ML-% IV SOLN
900.0000 [IU]/h | INTRAVENOUS | Status: DC
  Administered 2023-01-19 – 2023-01-20 (×2): 900 [IU]/h via INTRAVENOUS
  Filled 2023-01-19 (×2): qty 250

## 2023-01-19 NOTE — H&P (View-Only) (Signed)
[facility] Progress Note  Reginald Ayers 72 y.o. 01/18/1951  CC: Abnormal MRI concerning for choledocholithiasis   Subjective: Patient seen and examined at bedside.  Denies any acute issues.  Awaiting procedure today.  Heparin drip on hold since 7 AM this morning.  Confirmed with hospitalist as well as RN at bedside.  ROS : Afebrile, negative for chest pain   Objective: Vital signs in last 24 hours: Vitals:   01/19/23 0800 01/19/23 0900  BP: (!) 159/59   Pulse: 92 92  Resp: (!) 28 18  Temp: 98.6 F (37 C)   SpO2: 92% 96%    Physical Exam: Elderly patient, not in acute distress.  Abdominal exam benign.  Hard of hearing but otherwise alert and oriented x 3.  Lab Results: Recent Labs    01/18/23 0345 01/19/23 0200  NA 128* 123*  K 3.9 3.8  CL 99 91*  CO2 20* 23  GLUCOSE 173* 229*  BUN 12 18  CREATININE 0.92 0.99  CALCIUM 8.3* 7.6*   Recent Labs    01/18/23 0345  AST 29  ALT 19  ALKPHOS 47  BILITOT 0.2*  PROT 5.9*  ALBUMIN 2.9*   Recent Labs    01/18/23 0345 01/19/23 0200  WBC 9.5 10.3  HGB 9.5* 8.3*  HCT 30.3* 25.2*  MCV 90.7 89.4  PLT 293 261   No results for input(s): "LABPROT", "INR" in the last 72 hours.    Assessment/Plan: -Abnormal MRI MRCP concerning for severe intra and extrahepatic biliary dilation up to 1.8 cm with questionable stone in the cystic duct as well as near ampulla. -Paroxysmal atrial fibrillation.  Eliquis on hold.  Was on heparin which was held this morning at 7 AM. -History of coronary artery disease, CVA and peripheral arterial disease  Recommendations ----------------------- -Case discussed with Dr. Magod as well as Dr. Karki.  Plan for ERCP today afternoon. - pre op Cipro   Risks (post ERCP pancreatitis, bleeding, infection, bowel perforation that could require surgery, sedation-related changes in cardiopulmonary systems), benefits (identification and possible treatment of source of symptoms, exclusion of  certain causes of symptoms), and alternatives (watchful waiting, radiographic imaging studies, empiric medical treatment)  were explained to patient/family in detail and patient wishes to proceed.    Reginald Frey MD, FACP 01/19/2023, 10:32 AM  Contact #  [phone]  

## 2023-01-19 NOTE — TOC Progression Note (Signed)
Transition of Care [facility]) - Progression Note    Patient Details  Name: Reginald Ayers MRN: [id number] Date of Birth: [birth year]  Transition of Care [facility]) CM/SW Contact  Mearl Latin, LCSW Phone Number: 01/19/2023, 3:31 PM  Clinical Narrative:    9am-CSW received return call from [place] on patient's Facesheet. She reported that she has been helping patient for years and that she is likely his best contact as his niece was supposed to move in with him last year but left when she did not get paid. She provided CSW with an updated number for patient's roommate Cristal Deer and stated she will try to see if Christopher's mother has [name]'s contact info. Patient's mentation is improving.    Expected Discharge Plan: Skilled Nursing Facility Barriers to Discharge: Continued Medical Work up, SNF Pending bed offer, Family Issues  Expected Discharge Plan and Services In-house Referral: Clinical Social Work     Living arrangements for the past 2 months: Boarding House                                       Social Determinants of Health (SDOH) Interventions SDOH Screenings   Housing: Medium Risk (01/15/2023)  Tobacco Use: High Risk (01/19/2023)    Readmission Risk Interventions    01/12/2022   10:29 AM  Readmission Risk Prevention Plan  Transportation Screening Complete  PCP or Specialist Appt within 3-5 Days Complete  HRI or Home Care Consult Complete  Social Work Consult for Recovery Care Planning/Counseling Complete  Palliative Care Screening Not Applicable  Medication Review Oceanographer) Complete

## 2023-01-19 NOTE — Progress Notes (Signed)
Physical Therapy Treatment Patient Details Name: Reginald Ayers MRN: [id number] DOB: December 04, 1950 Today's Date: 01/19/2023   History of Present Illness 72 y/o M admitted to [facility] on 5/23 for weakness and pain in his RLE, pt found in fecal matter and bedbugs. MRI of brain with no acute abnormality, chronically occluded left ICA. CT of abdomen/pelvis with mild groundglass denisty in the RLL, distended gallbladder without calcified stone, dilated CBD with suspicion for intrahepatic dilatation. PMHx: COPD, CAD s/p CABG, DM, HTN, HLD, PAD s/p stent, tobacco dependence.    PT Comments    Pt greeted resting in bed and agreeable to session with continued progress towards acute goals. Pt requiring min A to come to sitting EOB with pt needing increased time and use of bed functions to complete. Pt continues to require mod A to come to standing with pt demonstrating posterior lean and bracing Les on EOB to maintain balance. Pt able to complete short gait in room with min A to steady and manage RW throughout. Pt continues to be limited by RLE pain, decreased activity tolerance, poor balance/postural reactions and general fatigue. Current plan remains appropriate to address deficits and maximize functional independence and decrease caregiver burden. Pt continues to benefit from skilled PT services to progress toward functional mobility goals.    Recommendations for follow up therapy are one component of a multi-disciplinary discharge planning process, led by the attending physician.  Recommendations may be updated based on patient status, additional functional criteria and insurance authorization.  Follow Up Recommendations  Can patient physically be transported by private vehicle: Yes    Assistance Recommended at Discharge Frequent or constant Supervision/Assistance  Patient can return home with the following A lot of help with walking and/or transfers;Assistance with cooking/housework;Assist for  transportation;Help with stairs or ramp for entrance;A lot of help with bathing/dressing/bathroom   Equipment Recommendations  Other (comment) (defer to next level)    Recommendations for Other Services       Precautions / Restrictions Precautions Precautions: Fall Precaution Comments: bed bugs Restrictions Weight Bearing Restrictions: No     Mobility  Bed Mobility Overal bed mobility: Needs Assistance Bed Mobility: Supine to Sit, Sit to Supine     Supine to sit: Min assist Sit to supine: Min assist   General bed mobility comments: min A to elevate trunk and return LEs to bed at end of session    Transfers Overall transfer level: Needs assistance Equipment used: Rolling walker (2 wheels) Transfers: Sit to/from Stand Sit to Stand: Mod assist, Min assist           General transfer comment: mod A to power up initilly and steady, down to min A    Ambulation/Gait Ambulation/Gait assistance: Min assist Gait Distance (Feet): 8 Feet (x2) Assistive device: Rolling walker (2 wheels) Gait Pattern/deviations: Step-through pattern, Trunk flexed, Drifts right/left, Shuffle, Decreased dorsiflexion - left, Decreased dorsiflexion - right, Decreased stride length, Leaning posteriorly Gait velocity: decreased     General Gait Details: minA for balance and strength, decreased B step length and foot clearance, mild posterior lean intermittently requiring assist to correct. Increased time required with brief standing rest breaks for conversation   Stairs             Wheelchair Mobility    Modified Rankin (Stroke Patients Only)       Balance Overall balance assessment: Needs assistance Sitting-balance support: Feet supported Sitting balance-Leahy Scale: Fair   Postural control: Posterior lean Standing balance support: Bilateral upper extremity supported, Reliant  on assistive device for balance, During functional activity Standing balance-Leahy Scale: Poor Standing  balance comment: reliant on RW and intermittent external support for balance                            Cognition Arousal/Alertness: Awake/alert Behavior During Therapy: WFL for tasks assessed/performed Overall Cognitive Status: No family/caregiver present to determine baseline cognitive functioning                                 General Comments: A&Ox4, follows 1 step commands.        Exercises      General Comments General comments (skin integrity, edema, etc.): VSS on RA, RLE red and inflammed      Pertinent Vitals/Pain Pain Assessment Pain Assessment: Faces Faces Pain Scale: Hurts little more Pain Location: RLE Pain Descriptors / Indicators: Sore Pain Intervention(s): Monitored during session, Limited activity within patient's tolerance    Home Living                          Prior Function            PT Goals (current goals can now be found in the care plan section) Acute Rehab PT Goals Patient Stated Goal: get better PT Goal Formulation: With patient Time For Goal Achievement: 01/29/23 Progress towards PT goals: Progressing toward goals    Frequency    Min 3X/week      PT Plan      Co-evaluation              AM-PAC PT "6 Clicks" Mobility   Outcome Measure  Help needed turning from your back to your side while in a flat bed without using bedrails?: A Little Help needed moving from lying on your back to sitting on the side of a flat bed without using bedrails?: A Lot Help needed moving to and from a bed to a chair (including a wheelchair)?: A Lot Help needed standing up from a chair using your arms (e.g., wheelchair or bedside chair)?: A Lot Help needed to walk in hospital room?: A Little Help needed climbing 3-5 steps with a railing? : Total 6 Click Score: 13    End of Session Equipment Utilized During Treatment: Gait belt Activity Tolerance: Patient tolerated treatment well Patient left: in bed;with  call bell/phone within reach;with bed alarm set Nurse Communication: Mobility status PT Visit Diagnosis: Unsteadiness on feet (R26.81);Muscle weakness (generalized) (M62.81);History of falling (Z91.81);Difficulty in walking, not elsewhere classified (R26.2)     Time: 1610-9604 PT Time Calculation (min) (ACUTE ONLY): 27 min  Charges:  $Gait Training: 8-22 mins $Therapeutic Activity: 8-22 mins                     Luvenia Cranford R. PTA Acute Rehabilitation Services Office: [phone]   Catalina Antigua 01/19/2023, 12:28 PM

## 2023-01-19 NOTE — Anesthesia Preprocedure Evaluation (Addendum)
Anesthesia Evaluation  Patient identified by MRN, date of birth, ID band Patient awake    Reviewed: Allergy & Precautions, NPO status , Patient's Chart, lab work & pertinent test results  Airway Mallampati: II  TM Distance: >3 FB Neck ROM: Full    Dental  (+) Dental Advisory Given, Edentulous Upper, Edentulous Lower   Pulmonary COPD, former smoker   Pulmonary exam normal breath sounds clear to auscultation       Cardiovascular hypertension, + CAD, + CABG and + Peripheral Vascular Disease  Normal cardiovascular exam Rhythm:Regular Rate:Normal     Neuro/Psych CVA    GI/Hepatic Neg liver ROS,GERD  ,,  Endo/Other  diabetes, Type 2    Renal/GU Renal InsufficiencyRenal disease     Musculoskeletal negative musculoskeletal ROS (+)    Abdominal   Peds  Hematology  (+) Blood dyscrasia, anemia   Anesthesia Other Findings Day of surgery medications reviewed with the patient.  Reproductive/Obstetrics                              Anesthesia Physical Anesthesia Plan  ASA: 3  Anesthesia Plan: General   Post-op Pain Management: Minimal or no pain anticipated   Induction: Intravenous  PONV Risk Score and Plan: 2 and Dexamethasone and Ondansetron  Airway Management Planned: Oral ETT  Additional Equipment:   Intra-op Plan:   Post-operative Plan: Extubation in OR  Informed Consent: I have reviewed the patients History and Physical, chart, labs and discussed the procedure including the risks, benefits and alternatives for the proposed anesthesia with the patient or authorized representative who has indicated his/her understanding and acceptance.     Dental advisory given  Plan Discussed with: CRNA  Anesthesia Plan Comments:          Anesthesia Quick Evaluation

## 2023-01-19 NOTE — Progress Notes (Signed)
ANTICOAGULATION CONSULT NOTE - Follow Up Consult  Pharmacy Consult for heparin Indication: atrial fibrillation in setting of possible ERCP this week   Allergies  Allergen Reactions   Actos [Pioglitazone] Shortness Of Breath    Leg swelling    Lipitor [Atorvastatin] Other (See Comments)    Leg pain    Zestril [Lisinopril] Cough   Patient Measurements: Height: 5\' 1"  (154.9 cm) Weight: 43.5 kg (95 lb 14.4 oz) IBW/kg (Calculated) : 52.3 kg Heparin Dosing Weight: 43.5 kg  Vital Signs: Temp: 98 F (36.7 C) (05/28 1500) Temp Source: Oral (05/28 1500) BP: 115/64 (05/28 1500) Pulse Rate: 82 (05/28 1500)  Labs: Recent Labs    01/17/23 0346 01/17/23 1342 01/17/23 2140 01/18/23 0345 01/19/23 0200  HGB 9.3*  --   --  9.5* 8.3*  HCT 28.7*  --   --  30.3* 25.2*  PLT 285  --   --  293 261  APTT 51*   < > 86* 98* 79*  HEPARINUNFRC >1.10*  --   --  0.91* 0.40  CREATININE  --   --   --  0.92 0.99   < > = values in this interval not displayed.    Estimated Creatinine Clearance: 40.3 mL/min (by C-G formula based on SCr of 0.99 mg/dL).  Medications:  Infusions:    Assessment: 72 YO M admitted with PMH of atrial fibrillation on Eliquis prior to admission. Pharmacy consulted to transition to heparin in setting to possible ERCP. Last dose of apixaban 5/24 at 2100. Will monitor aPTT until correlating with heparin level. aPTT this morning prior to turning heparin off was therapeutic at 79, correlating with heparin level 0.4 on 900 units/hr. Hgb 8.3, plt 261. No line issues or signs/symptoms of bleeding noted.   PM: patient is now s/p ERCP; clarified with Dr. Marca Ancona that ok to resume heparin now.  Goal of Therapy:  Heparin level 0.3-0.7 units/ml aPTT 66-102 seconds Monitor platelets by anticoagulation protocol: Yes   Plan:  Resume heparin 900 units/hr. Daily CBC, heparin level. Monitor for signs/symptoms of bleeding. Follow up further invasive workup and when safe to resume  apixaban  Loralee Pacas, PharmD, BCPS 01/19/2023,3:32 PM  Please check AMION for all [facility] Pharmacy phone numbers After 10:00 PM, call Main Pharmacy [phone]

## 2023-01-19 NOTE — TOC Progression Note (Signed)
Transition of Care [facility]) - Progression Note    Patient Details  Name: Reginald Ayers MRN: [id number] Date of Birth: [birth year]  Transition of Care [facility]) CM/SW Contact  Mearl Latin, LCSW Phone Number: 01/19/2023, 4:58 PM  Clinical Narrative:    CSW met with patient at bedside to discuss SNF. Patient is pleasant and hard of hearing. CSW explained SNF and patient asked if he could return home with home health like he did last year (went home with [place]). He stated they came out to the house and worked with him, which he liked. Though he stated whoever comes out would have to be ok with his 7 dogs. He confirmed he lives with his roommates [name] and [name] who can cook for him if needed. He has a walker, wheelchair, and two bedside commodes. He stated he has not been able to get in touch with his niece. He stated that he is not sure [place] is someone he can rely on but he has his cell phone if he ever needs to call 911. CSW went over SNF versus home health again just to ensure understanding and he again requested to go home at discharge. Will follow up.    Expected Discharge Plan: Home w Home Health Services Barriers to Discharge: Continued Medical Work up  Expected Discharge Plan and Services In-house Referral: Clinical Social Work   Post Acute Care Choice: Home Health Living arrangements for the past 2 months: Boarding House                                       Social Determinants of Health (SDOH) Interventions SDOH Screenings   Housing: Medium Risk (01/15/2023)  Tobacco Use: High Risk (01/19/2023)    Readmission Risk Interventions    01/12/2022   10:29 AM  Readmission Risk Prevention Plan  Transportation Screening Complete  PCP or Specialist Appt within 3-5 Days Complete  HRI or Home Care Consult Complete  Social Work Consult for Recovery Care Planning/Counseling Complete  Palliative Care Screening Not Applicable  Medication Review Oceanographer) Complete

## 2023-01-19 NOTE — Interval H&P Note (Signed)
History and Physical Interval Note: 72/male with abnormal MRCP with possible stone, Eliquis and plavix on hold since 01/15/23 for an ERCP, heparin on hold at 7 am.  01/19/2023 12:24 PM  Reginald Ayers  has presented today for ERCP, with the diagnosis of CBD stones.  The various methods of treatment have been discussed with the patient and family. After consideration of risks, benefits and other options for treatment, the patient has consented to  Procedure(s): ENDOSCOPIC RETROGRADE CHOLANGIOPANCREATOGRAPHY (ERCP) (N/A) as a surgical intervention.  The patient's history has been reviewed, patient examined, no change in status, stable for surgery.  I have reviewed the patient's chart and labs.  Questions were answered to the patient's satisfaction.     Kerin Salen

## 2023-01-19 NOTE — Transfer of Care (Signed)
Immediate Anesthesia Transfer of Care Note  Patient: Reginald Ayers  Procedure(s) Performed: ENDOSCOPIC RETROGRADE CHOLANGIOPANCREATOGRAPHY (ERCP)  Patient Location: Endoscopy Unit  Anesthesia Type:MAC  Level of Consciousness: awake, alert , and oriented  Airway & Oxygen Therapy: Patient Spontanous Breathing  Post-op Assessment: Report given to RN and Post -op Vital signs reviewed and stable  Post vital signs: Reviewed and stable  Last Vitals:  Vitals Value Taken Time  BP    Temp    Pulse    Resp    SpO2      Last Pain:  Vitals:   01/19/23 1211  TempSrc: Tympanic  PainSc: 2          Complications: No notable events documented.

## 2023-01-19 NOTE — Progress Notes (Signed)
[facility]astroenterology Progress Note  Reginald Ayers 72 y.o. [birth year]  CC: Abnormal MRI concerning for choledocholithiasis   Subjective: Patient seen and examined at bedside.  Denies any acute issues.  Awaiting procedure today.  Heparin drip on hold since 7 AM this morning.  Confirmed with hospitalist as well as RN at bedside.  ROS : Afebrile, negative for chest pain   Objective: Vital signs in last 24 hours: Vitals:   01/19/23 0800 01/19/23 0900  BP: (!) 159/59   Pulse: 92 92  Resp: (!) 28 18  Temp: 98.6 F (37 C)   SpO2: 92% 96%    Physical Exam: Elderly patient, not in acute distress.  Abdominal exam benign.  Hard of hearing but otherwise alert and oriented x 3.  Lab Results: Recent Labs    01/18/23 0345 01/19/23 0200  NA 128* 123*  K 3.9 3.8  CL 99 91*  CO2 20* 23  GLUCOSE 173* 229*  BUN 12 18  CREATININE 0.92 0.99  CALCIUM 8.3* 7.6*   Recent Labs    01/18/23 0345  AST 29  ALT 19  ALKPHOS 47  BILITOT 0.2*  PROT 5.9*  ALBUMIN 2.9*   Recent Labs    01/18/23 0345 01/19/23 0200  WBC 9.5 10.3  HGB 9.5* 8.3*  HCT 30.3* 25.2*  MCV 90.7 89.4  PLT 293 261   No results for input(s): "LABPROT", "INR" in the last 72 hours.    Assessment/Plan: -Abnormal MRI MRCP concerning for severe intra and extrahepatic biliary dilation up to 1.8 cm with questionable stone in the cystic duct as well as near ampulla. -Paroxysmal atrial fibrillation.  Eliquis on hold.  Was on heparin which was held this morning at 7 AM. -History of coronary artery disease, CVA and peripheral arterial disease  Recommendations ----------------------- -Case discussed with Dr. Ewing Schlein as well as Dr. Marca Ancona.  Plan for ERCP today afternoon. - pre op Cipro   Risks (post ERCP pancreatitis, bleeding, infection, bowel perforation that could require surgery, sedation-related changes in cardiopulmonary systems), benefits (identification and possible treatment of source of symptoms, exclusion of  certain causes of symptoms), and alternatives (watchful waiting, radiographic imaging studies, empiric medical treatment)  were explained to patient/family in detail and patient wishes to proceed.    Kathi Der MD, FACP 01/19/2023, 10:32 AM  Contact #  [phone]

## 2023-01-19 NOTE — Op Note (Signed)
[facility] Patient Name: Reginald Ayers Procedure Date : 01/19/2023 MRN: [id number] Attending MD: Kerin Salen , MD, [id number] Date of Birth: 12/30/1950 CSN: [id number] Age: 73 Admit Type: Inpatient Procedure:                ERCP Indications:              Abnormal MRCP Providers:                Kerin Salen, MD, Lorenza Evangelist, RN, Kandice Robinsons, Technician Referring MD:             [facility] Medicines:                See the Anesthesia note for documentation of the                            administered medications Complications:            No immediate complications. Estimated Blood Loss:     Estimated blood loss: none. Procedure:                Pre-Anesthesia Assessment:                           - Prior to the procedure, a History and Physical                            was performed, and patient medications and                            allergies were reviewed. The patient's tolerance of                            previous anesthesia was also reviewed. The risks                            and benefits of the procedure and the sedation                            options and risks were discussed with the patient.                            All questions were answered, and informed consent                            was obtained. Prior Anticoagulants: The patient has                            taken Eliquis (apixaban), last dose was 4 days                            prior to procedure.                           The patient has taken Plavix(Clopidogrel)  last dose                            was 4 days prior to procedure.                           The patient has taken Heparin, last dose was today                            at 7 am,6 hour prior to procedure                           ASA Grade Assessment: III - A patient with severe                            systemic disease. After reviewing the risks and                             benefits, the patient was deemed in satisfactory                            condition to undergo the procedure.                           After obtaining informed consent, the scope was                            passed under direct vision. Throughout the                            procedure, the patient's blood pressure, pulse, and                            oxygen saturations were monitored continuously. The                            [id number]-[id number] ([id number] Olympus duodenoscope was                            introduced through the mouth, and used to inject                            contrast into and used to inject contrast into the                            bile duct. The ERCP was somewhat difficult due to                            significant looping. Successful completion of the                            procedure was aided by applying abdominal pressure.  The patient tolerated the procedure well. Scope In: Scope Out: Findings:      The scout film was normal. The esophagus was successfully intubated       under direct vision. The scope was advanced to a normal major papilla in       the descending duodenum without detailed examination of the pharynx,       larynx and associated structures, and upper GI tract. The upper GI tract       was grossly normal.      The bile duct was deeply cannulated with the sphincterotome. Contrast       was injected. I personally interpreted the bile duct images. There was       brisk flow of contrast through the ducts. Image quality was excellent.       Contrast extended to the main bile duct.      The main bile duct was severely dilated. A straight Roadrunner wire was       passed into the biliary tree.      A 10 mm biliary sphincterotomy was made with a monofilament       sphincterotome using ERBE electrocautery. There was no       post-sphincterotomy bleeding.      The biliary tree was swept with a 12 mm balloon,several  times, starting       at the bifurcation. Sludge was swept from the duct.      The pancreatic duct was not canulated or injected during the procedure. Impression:               - The entire main bile duct was severely dilated.                           - A biliary sphincterotomy was performed.                           - The biliary tree was swept and sludge was found. Moderate Sedation:      Patient did not receive moderate sedation for this procedure, but       instead received monitored anesthesia care. Recommendation:           - Advance diet as tolerated.                           - Consider cholecystectomy as patient was noted to                            have gallbladder stone and sludge on MRCP. Procedure Code(s):        --- Professional ---                           [phone], Endoscopic retrograde                            cholangiopancreatography (ERCP); with removal of                            calculi/debris from biliary/pancreatic duct(s)                           [id number], Endoscopic retrograde  cholangiopancreatography (ERCP); with                            sphincterotomy/papillotomy                           [phone], Endoscopic catheterization of the biliary                            ductal system, radiological supervision and                            interpretation Diagnosis Code(s):        --- Professional ---                           K83.8, Other specified diseases of biliary tract                           R93.2, Abnormal findings on diagnostic imaging of                            liver and biliary tract CPT copyright 2022 [employer] Association. All rights reserved. The codes documented in this report are preliminary and upon coder review may  be revised to meet current compliance requirements. Kerin Salen, MD 01/19/2023 1:46:20 PM This report has been signed electronically. Number of Addenda: 0

## 2023-01-19 NOTE — Anesthesia Procedure Notes (Signed)
Procedure Name: Intubation Date/Time: 01/19/2023 12:47 PM  Performed by: Randon Goldsmith, CRNAPre-anesthesia Checklist: Patient identified, Emergency Drugs available, Suction available and Patient being monitored Patient Re-evaluated:Patient Re-evaluated prior to induction Oxygen Delivery Method: Circle system utilized Preoxygenation: Pre-oxygenation with 100% oxygen Induction Type: IV induction Ventilation: Mask ventilation without difficulty Laryngoscope Size: Mac and 4 Grade View: Grade I Tube type: Oral Tube size: 7.0 mm Number of attempts: 1 Airway Equipment and Method: Stylet and Oral airway Placement Confirmation: ETT inserted through vocal cords under direct vision, positive ETCO2 and breath sounds checked- equal and bilateral Secured at: 22 cm Tube secured with: Tape Dental Injury: Teeth and Oropharynx as per pre-operative assessment

## 2023-01-19 NOTE — Progress Notes (Signed)
PROGRESS NOTE        PATIENT DETAILS Name: Reginald Ayers Age: 72 y.o. Sex: male Date of Birth: 1950/12/23 Admit Date: 01/14/2023 Admitting Physician Jonah Blue, MD ZOX:[id number], Selena Batten, NP  Brief Summary: Patient is a 72 y.o.  male COPD, CAD s/p CABG, PAD s/p stent, HLD, HTN-who presented with weakness.  Per H&P-patient was found at home with bedbugs, and covered in fecal matter.   Significant events: 5/23>> admit to [facility]  Significant studies: 5/23>> MRI brain: No acute abnormality-chronically occluded left ICA 5/23>> CT abdomen/pelvis: Mild groundglass density in the right lower lobe, distended gallbladder  without calcified stone, dilated CBD measuring up to 19 mm with suspicion for intrahepatic dilatation. 5/24>> MRCP:Severe intrahepatic/extrahepatic biliary ductal dilatation-tiny gallstones/sludge within the cystic duct near the ampulla.  Significant microbiology data: None  Procedures: None  Consults: None  Subjective: Per patient/nursing staff-he is drinking quite a lot of water for the past several days.  He has no abdominal pain.  Objective: Vitals: Blood pressure (!) 124/56, pulse 91, temperature (!) 97.5 F (36.4 C), temperature source Tympanic, resp. rate 20, height 5\' 1"  (1.549 m), weight 43.5 kg, SpO2 100 %.   Exam: Gen Exam:Alert awake-not in any distress HEENT:atraumatic, normocephalic Chest: B/L clear to auscultation anteriorly CVS:S1S2 regular Abdomen:soft non tender, non distended Extremities:no edema Neurology: Non focal Skin: no rash  Pertinent Labs/Radiology:    Latest Ref Rng & Units 01/19/2023    2:00 AM 01/18/2023    3:45 AM 01/17/2023    3:46 AM  CBC  WBC 4.0 - 10.5 K/uL 10.3  9.5  8.4   Hemoglobin 13.0 - 17.0 g/dL 8.3  9.5  9.3   Hematocrit 39.0 - 52.0 % 25.2  30.3  28.7   Platelets 150 - 400 K/uL 261  293  285     Lab Results  Component Value Date   NA 123 (L) 01/19/2023   K 3.8 01/19/2023   CL 91 (L)  01/19/2023   CO2 23 01/19/2023      Assessment/Plan: Weakness Poor historian-unclear etiology-given MRCP findings-concerned that his clinical presentation on admission could have been due to a passed CBD stone/symptomatic choledocholithiasis..    Anion gap metabolic acidosis Likely starvation ketoacidosis-due to poor intake/possible vomiting-in the setting of possible passed CBD stone. Improving with supportive care  Dilated common bile duct/intrahepatic dilatation likely secondary to choledocholithiasis Although he is a poor historian-suspect his presenting complaints are likely due to symptomatic choledocholithiasis-he may have passed a CBD stone. ERCP is tentatively scheduled for 5/28.    HTN BP better controlled with amlodipine/metoprolol.  CAD-s/p CABG No anginal symptoms See below regarding antiplatelets. Continue statin/beta-blocker  History of CVA No obvious deficits on exam-MRI brain negative Continue statin See below regarding antiplatelets.  PAF Sinus rhythm Eliquis held on 5/25-for potential ERCP on Tuesday-will place on IV heparin in the interim.  PAD Has had numerous procedures in the past-follow with Dr. Myra Gianotti Prior to this hospitalization he was on aspirin/Plavix/Eliquis Discussed with Dr. Myra Gianotti on 5/24-okay to stop Plavix (as he is on Eliquis) Remains on aspirin/statin  DM-2 CBGs relatively stable Semglee 10 units added on 5/25-will give it a few more days before adjusting Continue SSI.  Continue to hold metformin-May need to consider stopping given metabolic acidosis on presentation.   Recent Labs    01/18/23 1634 01/18/23 2109 01/19/23 0839  GLUCAP  317* 234* 230*    Hyponatremia Worsening in spite of stopping IV fluids on 5/27 Per nursing staff-he has been consuming quite a lot of liquid/water He is euvolemic on exam Begin fluid restrictions-check serum osmolality/urine osmolality Once he is back from ERCP-May need  Lasix  COPD Stable Bronchodilators  BMI: Estimated body mass index is 18.12 kg/m as calculated from the following:   Height as of this encounter: 5\' 1"  (1.549 m).   Weight as of this encounter: 43.5 kg.   Code status:   Code Status: DNR   DVT Prophylaxis: IV heparin gtt   Family Communication: Niece-[name] [phone]-left VM on 5/25, 5/26,5/27   Disposition Plan: Status is: Observation The patient will require care spanning > 2 midnights and should be moved to inpatient because: Severity of illness.   Planned Discharge Destination:Home health   Diet: Diet Order             Diet NPO time specified Except for: Sips with Meds  Diet effective midnight                     Antimicrobial agents: Anti-infectives (From admission, onward)    Start     Dose/Rate Route Frequency Ordered Stop   01/19/23 1130  ciprofloxacin (CIPRO) IVPB 400 mg        400 mg 200 mL/hr over 60 Minutes Intravenous  Once 01/19/23 1037     01/14/23 1745  cefTRIAXone (ROCEPHIN) 1 g in sodium chloride 0.9 % 100 mL IVPB        1 g 200 mL/hr over 30 Minutes Intravenous  Once 01/14/23 1741 01/14/23 1922   01/14/23 1745  azithromycin (ZITHROMAX) 500 mg in sodium chloride 0.9 % 250 mL IVPB        500 mg 250 mL/hr over 60 Minutes Intravenous  Once 01/14/23 1741 01/14/23 2315        MEDICATIONS: Scheduled Meds:  [MAR Hold]  stroke: early stages of recovery book   Does not apply Once   [facility] Hold] amLODipine  5 mg Oral Daily   [MAR Hold] aspirin EC  81 mg Oral Daily   [MAR Hold] feeding supplement  237 mL Oral BID BM   [MAR Hold] gabapentin  200 mg Oral BID   [MAR Hold] insulin aspart  0-15 Units Subcutaneous TID WC   [MAR Hold] insulin aspart  0-5 Units Subcutaneous QHS   [MAR Hold] insulin glargine-yfgn  10 Units Subcutaneous Daily   [MAR Hold] metoprolol succinate  25 mg Oral Daily   [MAR Hold] multivitamin with minerals  1 tablet Oral Daily   [MAR Hold] pantoprazole  40 mg Oral  Daily   [MAR Hold] rosuvastatin  20 mg Oral Daily   [MAR Hold] umeclidinium bromide  1 puff Inhalation QHS   Continuous Infusions:  ciprofloxacin 400 mg (01/19/23 1129)   lactated ringers 1,000 mL (01/19/23 1216)   PRN Meds:.[MAR Hold] acetaminophen **OR** [MAR Hold] acetaminophen (TYLENOL) oral liquid 160 mg/5 mL **OR** [MAR Hold] acetaminophen, [MAR Hold] albuterol, [MAR Hold] hydrALAZINE, lactated ringers, [MAR Hold] ondansetron (ZOFRAN) IV   I have personally reviewed following labs and imaging studies  LABORATORY DATA: CBC: Recent Labs  Lab 01/14/23 1120 01/14/23 1129 01/15/23 0717 01/16/23 0708 01/17/23 0346 01/18/23 0345 01/19/23 0200  WBC 6.5  --  6.5 8.4 8.4 9.5 10.3  NEUTROABS 5.3  --   --   --   --   --   --   HGB 9.4*   < >  10.2* 9.9* 9.3* 9.5* 8.3*  HCT 30.3*   < > 31.5* 30.1* 28.7* 30.3* 25.2*  MCV 91.0  --  88.5 86.5 88.0 90.7 89.4  PLT 271  --  277 287 285 293 261   < > = values in this interval not displayed.     Basic Metabolic Panel: Recent Labs  Lab 01/14/23 1120 01/14/23 1129 01/14/23 1412 01/15/23 0717 01/16/23 0708 01/18/23 0345 01/19/23 0200  NA 128* 128* 129* 131* 131* 128* 123*  K 4.7 4.7 4.1 3.5 3.5 3.9 3.8  CL 98 105  --  101 101 99 91*  CO2 13*  --   --  19* 19* 20* 23  GLUCOSE 192* 192*  --  202* 302* 173* 229*  BUN 40* 47*  --  16 19 12 18   CREATININE 1.29* 1.20  --  0.95 0.98 0.92 0.99  CALCIUM 9.3  --   --  8.6* 8.9 8.3* 7.6*     GFR: Estimated Creatinine Clearance: 40.3 mL/min (by C-G formula based on SCr of 0.99 mg/dL).  Liver Function Tests: Recent Labs  Lab 01/14/23 1120 01/15/23 0717 01/16/23 0708 01/18/23 0345  AST 37 29 32 29  ALT 17 19 22 19   ALKPHOS 64 55 73 47  BILITOT 0.9 0.5 0.6 0.2*  PROT 6.7 6.2* 6.2* 5.9*  ALBUMIN 3.6 3.3* 3.3* 2.9*    Recent Labs  Lab 01/14/23 1836  LIPASE 22    No results for input(s): "AMMONIA" in the last 168 hours.  Coagulation Profile: Recent Labs  Lab  01/14/23 1120  INR 1.2     Cardiac Enzymes: No results for input(s): "CKTOTAL", "CKMB", "CKMBINDEX", "TROPONINI" in the last 168 hours.  BNP (last 3 results) No results for input(s): "PROBNP" in the last 8760 hours.  Lipid Profile: No results for input(s): "CHOL", "HDL", "LDLCALC", "TRIG", "CHOLHDL", "LDLDIRECT" in the last 72 hours.   Thyroid Function Tests: No results for input(s): "TSH", "T4TOTAL", "FREET4", "T3FREE", "THYROIDAB" in the last 72 hours.  Anemia Panel: No results for input(s): "VITAMINB12", "FOLATE", "FERRITIN", "TIBC", "IRON", "RETICCTPCT" in the last 72 hours.  Urine analysis:    Component Value Date/Time   COLORURINE YELLOW 01/14/2023 1059   APPEARANCEUR CLEAR 01/14/2023 1059   LABSPEC 1.017 01/14/2023 1059   PHURINE 5.0 01/14/2023 1059   GLUCOSEU NEGATIVE 01/14/2023 1059   HGBUR SMALL (A) 01/14/2023 1059   BILIRUBINUR NEGATIVE 01/14/2023 1059   KETONESUR 5 (A) 01/14/2023 1059   PROTEINUR 100 (A) 01/14/2023 1059   UROBILINOGEN 0.2 07/07/2015 1842   NITRITE NEGATIVE 01/14/2023 1059   LEUKOCYTESUR NEGATIVE 01/14/2023 1059    Sepsis Labs: Lactic Acid, Venous    Component Value Date/Time   LATICACIDVEN 1.6 01/10/2022 1854    MICROBIOLOGY: No results found for this or any previous visit (from the past 240 hour(s)).  RADIOLOGY STUDIES/RESULTS: No results found.   LOS: 4 days   [name] Massed, MD  [facility]    To contact the attending provider between 7A-7P or the covering provider during after hours 7P-7A, please log into the web site [email or web address] and access using universal Erma password for that web site. If you do not have the password, please call the hospital operator.  01/19/2023, 12:18 PM

## 2023-01-20 DIAGNOSIS — Z01818 Encounter for other preprocedural examination: Secondary | ICD-10-CM | POA: Diagnosis not present

## 2023-01-20 DIAGNOSIS — E8729 Other acidosis: Secondary | ICD-10-CM | POA: Diagnosis not present

## 2023-01-20 DIAGNOSIS — I2510 Atherosclerotic heart disease of native coronary artery without angina pectoris: Secondary | ICD-10-CM | POA: Diagnosis not present

## 2023-01-20 DIAGNOSIS — I10 Essential (primary) hypertension: Secondary | ICD-10-CM | POA: Diagnosis not present

## 2023-01-20 DIAGNOSIS — I739 Peripheral vascular disease, unspecified: Secondary | ICD-10-CM | POA: Diagnosis not present

## 2023-01-20 DIAGNOSIS — J449 Chronic obstructive pulmonary disease, unspecified: Secondary | ICD-10-CM | POA: Diagnosis not present

## 2023-01-20 LAB — BASIC METABOLIC PANEL
Anion gap: 13 (ref 5–15)
BUN: 22 mg/dL (ref 8–23)
CO2: 24 mmol/L (ref 22–32)
Calcium: 9.3 mg/dL (ref 8.9–10.3)
Chloride: 88 mmol/L — ABNORMAL LOW (ref 98–111)
Creatinine, Ser: 1.07 mg/dL (ref 0.61–1.24)
GFR, Estimated: >60 mL/min (ref >60)
Glucose, Bld: 217 mg/dL — ABNORMAL HIGH (ref 70–99)
Potassium: 5.2 mmol/L — ABNORMAL HIGH (ref 3.5–5.1)
Sodium: 125 mmol/L — ABNORMAL LOW (ref 135–145)

## 2023-01-20 LAB — CBC
HCT: 25.5 % — ABNORMAL LOW (ref 39.0–52.0)
Hemoglobin: 8.3 g/dL — ABNORMAL LOW (ref 13.0–17.0)
MCH: 28.5 pg (ref 26.0–34.0)
MCHC: 32.5 g/dL (ref 30.0–36.0)
MCV: 87.6 fL (ref 80.0–100.0)
Platelets: 262 10*3/uL (ref 150–400)
RBC: 2.91 MIL/uL — ABNORMAL LOW (ref 4.22–5.81)
RDW: 14 % (ref 11.5–15.5)
WBC: 5.6 10*3/uL (ref 4.0–10.5)
nRBC: 0 % (ref 0.0–0.2)

## 2023-01-20 LAB — GLUCOSE, CAPILLARY
Glucose-Capillary: 213 mg/dL — ABNORMAL HIGH (ref 70–99)
Glucose-Capillary: 244 mg/dL — ABNORMAL HIGH (ref 70–99)
Glucose-Capillary: 212 mg/dL — ABNORMAL HIGH (ref 70–99)
Glucose-Capillary: 254 mg/dL — ABNORMAL HIGH (ref 70–99)

## 2023-01-20 LAB — HEPARIN LEVEL (UNFRACTIONATED): Heparin Unfractionated: 0.35 IU/mL (ref 0.30–0.70)

## 2023-01-20 MED ORDER — FUROSEMIDE 40 MG PO TABS
40.0000 mg | ORAL_TABLET | Freq: Once | ORAL | Status: AC
  Administered 2023-01-20: 40 mg via ORAL
  Filled 2023-01-20: qty 1

## 2023-01-20 NOTE — Consult Note (Addendum)
RAEL PHI 12-25-1950  [id number].    Requesting MD: Dr. [name] Massed Chief Complaint/Reason for Consult: Cholelithiasis, hx Choledocholithiasis   HPI: Reginald Ayers is a 72 y.o. male with a hx of prior L CVA, COPD, PAF on Eliquis (last dose 5/24 am), CAD s/p CABG, PAD s/p stent, B/l carotid artery disease (s/p RICA stent, total occlusion L ICA on [place] Jan 2024) on ASA/Plavix (last dose Plavix 5/24 am), HLD, HTN, DM, CKD3a, tobacco use who we were asked to see for Choledocholithiasis/Cholelithiasis   Patient presented on 5/23 w/ confusion, slurred speech, RLE pain/weakness, rash and diarrhea. Per notes was found to have bed bugs in the ED. CTH and MRI brain negative for acute process. CT A/P obtained for anion gap acidosis and showed distended gallbladder without obvious stones and a dilated CBD to 19mm. WBC, Lipase and LFT's wnl. Anion gap acidosis felt to be 2/2 starvation ketoacidosis. Admitted to [facility] for further w/u. MRCP w/ CBD dilated to 1.8cm with tiny gallstones and/or sludge within the cystic duct and likely near the ampulla. There was evidence of gallstones and sludge in the dependent gallbladder without signs of cholecystitis. GI consulted. Patient underwent ERCP with sphincterotomy on 5/28 by GI. Sludge was found and swept from the duct. We were asked to see.   Patient recently ate breakfast. No abdominal pain, n/v. Reports he lives at home with 2 roommates. Is currently 2L o2. Denies sob. Is not on o2 at baseline. Denies CP. Reports he can only walk 10-15 ft 2/2 RLE pain but does not get sob or develop cp with ambulation.   ROS: ROS As above, see hpi  Family History  Problem Relation Age of Onset   Heart attack Mother    CVA Mother    Heart disease Mother    Diabetes Sister    Hypertension Sister     Past Medical History:  Diagnosis Date   Carotid artery disease (HCC)    L-ICA 100%, mod R-ICA dz   COPD (chronic obstructive pulmonary disease) (HCC)    Coronary  artery disease    s/p CABG February 2010 by Dr. Andrey Spearman   Diabetes [facility])    Hyperlipidemia    Hypertension    Peripheral arterial disease (HCC)    post left common iliac and right SFA stenting remotely   Tobacco abuse         Past Surgical History:  Procedure Laterality Date   ABDOMINAL AORTOGRAM N/A 04/20/2017   Procedure: ABDOMINAL AORTOGRAM;  Surgeon: Nada Libman, MD;  [facility]: [facility];  Service: Cardiovascular;  Laterality: N/A;   ABDOMINAL AORTOGRAM W/LOWER EXTREMITY Right 12/08/2016   Procedure: Abdominal Aortogram w/Lower Extremity;  Surgeon: Nada Libman, MD;  Location: [facility];  Service: Cardiovascular;  Laterality: Right;   ABDOMINAL AORTOGRAM W/LOWER EXTREMITY N/A 09/07/2017   Procedure: ABDOMINAL AORTOGRAM W/LOWER EXTREMITY;  Surgeon: Nada Libman, MD;  [facility]: [facility];  Service: Cardiovascular;  Laterality: N/A;   ABDOMINAL AORTOGRAM W/LOWER EXTREMITY N/A 03/31/2022   Procedure: ABDOMINAL AORTOGRAM W/LOWER EXTREMITY;  Surgeon: Nada Libman, MD;  [facility]: [facility];  Service: Cardiovascular;  Laterality: N/A;   ABDOMINAL AORTOGRAM W/LOWER EXTREMITY Right 09/29/2022   Procedure: ABDOMINAL AORTOGRAM W/LOWER EXTREMITY;  Surgeon: Nada Libman, MD;  [facility];  Service: Cardiovascular;  Laterality: Right;   CARDIAC CATHETERIZATION  2010   CAROTID ANGIOGRAM  2014   CORONARY ARTERY BYPASS GRAFT  2010   LIMA-LAD, Lrad-OM1, SVG-RI, SVG-AM-dRCA   DOPPLER ECHOCARDIOGRAPHY  2010   LOWER EXTREMITY ANGIOGRAM  11/2006   left common femoral endarterectomy and patch angioplasty:The mid right SFA was angioplastied with a 4 x 8   LOWER EXTREMITY ANGIOGRAM  2003   left common iliac artery stenting by Dr. Erlene Quan   LOWER EXTREMITY ANGIOGRAPHY Right 04/20/2017   Procedure: Lower Extremity Angiography;  Surgeon: Nada Libman, MD;  Location: [facility];  Service: Cardiovascular;  Laterality: Right;    NM MYOVIEW LTD  2015   PERIPHERAL VASCULAR ATHERECTOMY Right 12/08/2016   Procedure: Peripheral Vascular Atherectomy;  Surgeon: Nada Libman, MD;  [facility];  Service: Cardiovascular;  Laterality: Right;   PERIPHERAL VASCULAR BALLOON ANGIOPLASTY Right 12/08/2016   Procedure: Peripheral Vascular Balloon Angioplasty;  Surgeon: Nada Libman, MD;  [facility];  Service: Cardiovascular;  Laterality: Right;   PERIPHERAL VASCULAR BALLOON ANGIOPLASTY Right 04/20/2017   Procedure: PERIPHERAL VASCULAR BALLOON ANGIOPLASTY;  Surgeon: Nada Libman, MD;  [facility];  Service: Cardiovascular;  Laterality: Right;  SFA   PERIPHERAL VASCULAR BALLOON ANGIOPLASTY  03/31/2022   Procedure: PERIPHERAL VASCULAR BALLOON ANGIOPLASTY;  Surgeon: Nada Libman, MD;  [facility];  Service: Cardiovascular;;  Right PT   PERIPHERAL VASCULAR BALLOON ANGIOPLASTY  09/29/2022   Procedure: PERIPHERAL VASCULAR BALLOON ANGIOPLASTY;  Surgeon: Nada Libman, MD;  [facility];  Service: Cardiovascular;;  Rt PT and Peroneal   PERIPHERAL VASCULAR CATHETERIZATION N/A 09/22/2016   Procedure: Abdominal Aortogram w/ bilateral Lower Extremity Runoff;  Surgeon: Nada Libman, MD;  [facility];  Service: Cardiovascular;  Laterality: N/A;   PERIPHERAL VASCULAR CATHETERIZATION Right 09/22/2016   Procedure: Peripheral Vascular Intervention;  Surgeon: Nada Libman, MD;  [facility];  Service: Cardiovascular;  Laterality: Right;   PERIPHERAL VASCULAR INTERVENTION  09/29/2022   Procedure: PERIPHERAL VASCULAR INTERVENTION;  Surgeon: Nada Libman, MD;  [facility];  Service: Cardiovascular;;  Rt Popliteal   TRANSCAROTID ARTERY REVASCULARIZATION  Right 12/26/2021   Procedure: Right Transcarotid Artery Revascularization;  Surgeon: Nada Libman, MD;  Location: [facility] OR;  Service: Vascular;  Laterality:  Right;   ULTRASOUND GUIDANCE FOR VASCULAR ACCESS Left 12/26/2021   Procedure: ULTRASOUND GUIDANCE FOR VASCULAR ACCESS, LEFT FEMORAL VEIN;  Surgeon: Nada Libman, MD;  Location: [facility];  Service: Vascular;  Laterality: Left;    Social History:  reports that he quit smoking about 6 years ago. His smoking use included cigarettes. His smokeless tobacco use includes chew. He reports that he does not drink alcohol and does not use drugs.  Allergies:  Allergies  Allergen Reactions   Actos [Pioglitazone] Shortness Of Breath    Leg swelling    Lipitor [Atorvastatin] Other (See Comments)    Leg pain    Zestril [Lisinopril] Cough    Medications Prior to Admission  Medication Sig Dispense Refill   acetaminophen (TYLENOL) 650 MG CR tablet Take 1,300 mg by mouth every 8 (eight) hours as needed for pain.     albuterol (VENTOLIN HFA) 108 (90 Base) MCG/ACT inhaler Inhale 2 puffs into the lungs every 4 (four) hours as needed for shortness of breath or wheezing.     apixaban (ELIQUIS) 2.5 MG TABS tablet Take 1 tablet (2.5 mg total) by mouth 2 (two) times daily. 180 tablet 0   aspirin 81 MG EC tablet  Take 1 tablet (81 mg total) by mouth daily. Swallow whole. 30 tablet 0   Blood Glucose Monitoring Suppl (BLOOD GLUCOSE MONITOR SYSTEM) w/Device KIT Used to test blood sugars 4 times daily 1 kit 0   clopidogrel (PLAVIX) 75 MG tablet Take 1 tablet (75 mg total) by mouth daily. 30 tablet 11   Cyanocobalamin (VITAMIN B-12 PO) Take 1 tablet by mouth daily.     ferrous sulfate 325 (65 FE) MG tablet Take 325 mg by mouth daily with breakfast.     Insulin Pen Needle 32G X 4 MM MISC Use to inject insulin 4 times daily. 100 each 0   metFORMIN (GLUCOPHAGE) 500 MG tablet Take 1,000 mg by mouth 2 (two) times daily with a meal.     metoprolol succinate (TOPROL-XL) 25 MG 24 hr tablet TAKE 1 TABLET(25 MG) BY MOUTH DAILY 30 tablet 10   Multiple Vitamins-Minerals (CENTRUM SILVER 50+MEN) TABS Take 1 tablet by mouth daily.      NITROSTAT 0.4 MG SL tablet Take 0.4 mg by mouth every 5 (five) minutes as needed for chest pain.  0   omeprazole (PRILOSEC) 20 MG capsule Take 20 mg by mouth daily.     oxymetazoline (AFRIN) 0.05 % nasal spray Place 1 spray into both nostrils 2 (two) times daily as needed for congestion. Mucinex brand     potassium chloride (K-DUR) 10 MEQ tablet Take 10 mEq by mouth daily with breakfast.     rosuvastatin (CRESTOR) 20 MG tablet Take 1 tablet (20 mg total) by mouth daily. 30 tablet 0   SPIRIVA HANDIHALER 18 MCG inhalation capsule Place 18 mcg into inhaler and inhale at bedtime.     traMADol (ULTRAM) 50 MG tablet Take 50 mg by mouth 3 (three) times daily as needed for pain.       Physical Exam: Blood pressure (!) 168/76, pulse 96, temperature 98.3 F (36.8 C), temperature source Oral, resp. rate 16, height 5\' 1"  (1.549 m), weight 43.5 kg, SpO2 92 %. General: pleasant, WD/WN male who is laying in bed in NAD Heart: regular, rate, and rhythm.   Lungs: Respiratory effort nonlabored Abd:  Soft, NT/ND Psych: A&Ox4 with an appropriate affect   Results for orders placed or performed during the hospital encounter of 01/14/23 (from the past 48 hour(s))  Glucose, capillary     Status: Abnormal   Collection Time: 01/18/23 12:25 PM  Result Value Ref Range   Glucose-Capillary 360 (H) 70 - 99 mg/dL    Comment: Glucose reference range applies only to samples taken after fasting for at least 8 hours.  Glucose, capillary     Status: Abnormal   Collection Time: 01/18/23  4:34 PM  Result Value Ref Range   Glucose-Capillary 317 (H) 70 - 99 mg/dL    Comment: Glucose reference range applies only to samples taken after fasting for at least 8 hours.  Glucose, capillary     Status: Abnormal   Collection Time: 01/18/23  9:09 PM  Result Value Ref Range   Glucose-Capillary 234 (H) 70 - 99 mg/dL    Comment: Glucose reference range applies only to samples taken after fasting for at least 8 hours.  Heparin level  (unfractionated)     Status: None   Collection Time: 01/19/23  2:00 AM  Result Value Ref Range   Heparin Unfractionated 0.40 0.30 - 0.70 IU/mL    Comment: (NOTE) The clinical reportable range upper limit is being lowered to >1.10 to align with the FDA approved guidance for  the current laboratory assay.  If heparin results are below expected values, and patient dosage has  been confirmed, suggest follow up testing of antithrombin III levels. Performed at [facility], [address] [place], [place] [address]   APTT     Status: Abnormal   Collection Time: 01/19/23  2:00 AM  Result Value Ref Range   aPTT 79 (H) 24 - 36 seconds    Comment:        IF BASELINE aPTT IS ELEVATED, SUGGEST PATIENT RISK ASSESSMENT BE USED TO DETERMINE APPROPRIATE ANTICOAGULANT THERAPY. Performed at [facility], [address], [place], [place] [address]   CBC     Status: Abnormal   Collection Time: 01/19/23  2:00 AM  Result Value Ref Range   WBC 10.3 4.0 - 10.5 K/uL   RBC 2.82 (L) 4.22 - 5.81 MIL/uL   Hemoglobin 8.3 (L) 13.0 - 17.0 g/dL   HCT 36.1 (L) 44.3 - 15.4 %   MCV 89.4 80.0 - 100.0 fL   MCH 29.4 26.0 - 34.0 pg   MCHC 32.9 30.0 - 36.0 g/dL   RDW 00.8 67.6 - 19.5 %   Platelets 261 150 - 400 K/uL   nRBC 0.0 0.0 - 0.2 %    Comment: Performed at [facility], [address], [place], [place] [address]  Basic metabolic panel     Status: Abnormal   Collection Time: 01/19/23  2:00 AM  Result Value Ref Range   Sodium 123 (L) 135 - 145 mmol/L   Potassium 3.8 3.5 - 5.1 mmol/L   Chloride 91 (L) 98 - 111 mmol/L   CO2 23 22 - 32 mmol/L   Glucose, Bld 229 (H) 70 - 99 mg/dL    Comment: Glucose reference range applies only to samples taken after fasting for at least 8 hours.   BUN 18 8 - 23 mg/dL   Creatinine, Ser 7.12 0.61 - 1.24 mg/dL   Calcium 7.6 (L) 8.9 - 10.3 mg/dL   GFR, Estimated >45 >80 mL/min    Comment: (NOTE) Calculated using the CKD-EPI Creatinine Equation (2021)     Anion gap 9 5 - 15    Comment: Performed at [facility], [address], [place], [place] [address]  Osmolality, urine     Status: None   Collection Time: 01/19/23  7:42 AM  Result Value Ref Range   Osmolality, Ur 426 300 - 900 mOsm/kg    Comment: Performed at [facility], [address], [place], [place] [address]  Osmolality     Status: None   Collection Time: 01/19/23  8:26 AM  Result Value Ref Range   Osmolality 279 275 - 295 mOsm/kg    Comment: Performed at [facility], [address], [place], [place] [address]  Glucose, capillary     Status: Abnormal   Collection Time: 01/19/23  8:39 AM  Result Value Ref Range   Glucose-Capillary 230 (H) 70 - 99 mg/dL    Comment: Glucose reference range applies only to samples taken after fasting for at least 8 hours.  Glucose, capillary     Status: Abnormal   Collection Time: 01/19/23  4:31 PM  Result Value Ref Range   Glucose-Capillary 298 (H) 70 - 99 mg/dL    Comment: Glucose reference range applies only to samples taken after fasting for at least 8 hours.  Glucose, capillary     Status: Abnormal   Collection Time: 01/19/23 10:12  PM  Result Value Ref Range   Glucose-Capillary 283 (H) 70 - 99 mg/dL    Comment: Glucose reference range applies only to samples taken after fasting for at least 8 hours.  Heparin level (unfractionated)     Status: None   Collection Time: 01/20/23  4:01 AM  Result Value Ref Range   Heparin Unfractionated 0.35 0.30 - 0.70 IU/mL    Comment: (NOTE) The clinical reportable range upper limit is being lowered to >1.10 to align with the FDA approved guidance for the current laboratory assay.  If heparin results are below expected values, and patient dosage has  been confirmed, suggest follow up testing of antithrombin III levels. Performed at [facility], [address] [place], [place] [address]   CBC     Status: Abnormal   Collection Time: 01/20/23  4:01 AM  Result Value Ref Range    WBC 5.6 4.0 - 10.5 K/uL   RBC 2.91 (L) 4.22 - 5.81 MIL/uL   Hemoglobin 8.3 (L) 13.0 - 17.0 g/dL   HCT 60.4 (L) 54.0 - 98.1 %   MCV 87.6 80.0 - 100.0 fL   MCH 28.5 26.0 - 34.0 pg   MCHC 32.5 30.0 - 36.0 g/dL   RDW 19.1 47.8 - 29.5 %   Platelets 262 150 - 400 K/uL   nRBC 0.0 0.0 - 0.2 %    Comment: Performed at [facility], [address], [place], [place] [address]  Basic metabolic panel     Status: Abnormal   Collection Time: 01/20/23  4:01 AM  Result Value Ref Range   Sodium 125 (L) 135 - 145 mmol/L   Potassium 5.2 (H) 3.5 - 5.1 mmol/L   Chloride 88 (L) 98 - 111 mmol/L   CO2 24 22 - 32 mmol/L   Glucose, Bld 217 (H) 70 - 99 mg/dL    Comment: Glucose reference range applies only to samples taken after fasting for at least 8 hours.   BUN 22 8 - 23 mg/dL   Creatinine, Ser 8.65 0.61 - 1.24 mg/dL   Calcium 9.3 8.9 - 78.4 mg/dL   GFR, Estimated >69 >62 mL/min    Comment: (NOTE) Calculated using the CKD-EPI Creatinine Equation (2021)    Anion gap 13 5 - 15    Comment: Performed at [facility], [address], [place], [place] [address]  Glucose, capillary     Status: Abnormal   Collection Time: 01/20/23  7:18 AM  Result Value Ref Range   Glucose-Capillary 213 (H) 70 - 99 mg/dL    Comment: Glucose reference range applies only to samples taken after fasting for at least 8 hours.   DG ERCP  Result Date: 01/19/2023 CLINICAL DATA:  Elective surgery Cholelithiasis EXAM: ERCP TECHNIQUE: Multiple spot images obtained with the fluoroscopic device and submitted for interpretation post-procedure. FLUOROSCOPY: Radiation Exposure Index (as provided by the fluoroscopic device): 8.8 mGy Kerma COMPARISON:  CT abdomen pelvis 01/14/2023 FINDINGS: Seven intraoperative fluoroscopic images were submitted for interpretation. The submitted images demonstrate moderately dilated intra and extrahepatic bile ducts. Three small filling defects seen in the distal common bile duct on the final image  may be artifact, however small choledocholiths are difficult to exclude. IMPRESSION: Intraoperative fluoroscopic images of ERCP demonstrate moderately dilated intra and extrahepatic bile ducts. These images were submitted for radiologic interpretation only. Please see the procedural report for the amount of contrast and the fluoroscopy time utilized. Electronically Signed   By: Mauri Reading  Mir  M.D.   On: 01/19/2023 14:07    Anti-infectives (From admission, onward)    Start     Dose/Rate Route Frequency Ordered Stop   01/19/23 1130  ciprofloxacin (CIPRO) IVPB 400 mg        400 mg 200 mL/hr over 60 Minutes Intravenous  Once 01/19/23 1037 01/19/23 1230   01/14/23 1745  cefTRIAXone (ROCEPHIN) 1 g in sodium chloride 0.9 % 100 mL IVPB        1 g 200 mL/hr over 30 Minutes Intravenous  Once 01/14/23 1741 01/14/23 1922   01/14/23 1745  azithromycin (ZITHROMAX) 500 mg in sodium chloride 0.9 % 250 mL IVPB        500 mg 250 mL/hr over 60 Minutes Intravenous  Once 01/14/23 1741 01/14/23 2315       Assessment/Plan Cholelithiasis, Choledocholithiasis  - S/p ERCP with sphincterotomy on 5/28 by GI. Sludge was found and swept from the duct - No evidence of Cholecystitis on imaging. WBC wnl. He is NT on exam.  - Recommend cardiac evaluation for peri-operative risk stratification.  - We will follow with you  FEN - Okay for diet. No surgery planned today VTE - SCDs, okay for heparin gtt ID - Cipro peri-op. None currently.   Prior L CVA COPD - new O2 need, on 2L PAF on Eliquis (last dose 5/24 am) CAD s/p CABG  PAD s/p stent B/l carotid artery disease (s/p RICA stent, total occlusion L ICA on [place] Jan 2024) on ASA/Plavix (last dose Plavix 5/24 am) Hx HLD Hx HTN Hx DM Hx CKD3a Hx tobacco use  I reviewed nursing notes, ED provider notes, Consultant (GI) notes, hospitalist notes, last 24 h vitals and pain scores, last 48 h intake and output, last 24 h labs and trends, and last 24 h imaging  results.  Jacinto Halim, [facility] 01/20/2023, 10:38 AM Please see Amion for pager number during day hours 7:00am-4:30pm

## 2023-01-20 NOTE — Progress Notes (Signed)
PROGRESS NOTE        PATIENT DETAILS Name: Reginald Ayers Age: 72 y.o. Sex: male Date of Birth: 09-25-1950 Admit Date: 01/14/2023 Admitting Physician Jonah Blue, MD UEA:[id number], Selena Batten, NP  Brief Summary: Patient is a 72 y.o.  male COPD, CAD s/p CABG, PAD s/p stent, HLD, HTN-who presented with weakness.  Per H&P-patient was found at home with bedbugs, and covered in fecal matter.   Significant events: 5/23>> admit to [facility]  Significant studies: 5/23>> MRI brain: No acute abnormality-chronically occluded left ICA 5/23>> CT abdomen/pelvis: Mild groundglass density in the right lower lobe, distended gallbladder  without calcified stone, dilated CBD measuring up to 19 mm with suspicion for intrahepatic dilatation. 5/24>> MRCP:Severe intrahepatic/extrahepatic biliary ductal dilatation-tiny gallstones/sludge within the cystic duct near the ampulla.  Significant microbiology data: None  Procedures: None  Consults: None  Subjective: No complaints overnight-tolerating diet.  No vomiting.  Objective: Vitals: Blood pressure (!) 168/76, pulse 96, temperature 98.3 F (36.8 C), temperature source Oral, resp. rate 16, height 5\' 1"  (1.549 m), weight 43.5 kg, SpO2 92 %.   Exam: Gen Exam:Alert awake-not in any distress HEENT:atraumatic, normocephalic Chest: B/L clear to auscultation anteriorly CVS:S1S2 regular Abdomen:soft non tender, non distended Extremities:no edema Neurology: Non focal Skin: no rash  Pertinent Labs/Radiology:    Latest Ref Rng & Units 01/20/2023    4:01 AM 01/19/2023    2:00 AM 01/18/2023    3:45 AM  CBC  WBC 4.0 - 10.5 K/uL 5.6  10.3  9.5   Hemoglobin 13.0 - 17.0 g/dL 8.3  8.3  9.5   Hematocrit 39.0 - 52.0 % 25.5  25.2  30.3   Platelets 150 - 400 K/uL 262  261  293     Lab Results  Component Value Date   NA 125 (L) 01/20/2023   K 5.2 (H) 01/20/2023   CL 88 (L) 01/20/2023   CO2 24 01/20/2023       Assessment/Plan: Weakness Poor historian-unclear etiology-given MRCP findings-concerned that his clinical presentation on admission could have been due to a passed CBD stone/symptomatic choledocholithiasis..    Anion gap metabolic acidosis Likely starvation ketoacidosis-due to poor intake/possible vomiting-in the setting of possible passed CBD stone. Improving with supportive care  Dilated common bile duct/intrahepatic dilatation likely secondary to choledocholithiasis Although he is a poor historian-suspect his presenting complaints are likely due to symptomatic choledocholithiasis-he may have passed a CBD stone.  ERCP with sphincterotomy on 5/28-will consult general surgery to see if he is a candidate for laparoscopic cholecystectomy.  HTN BP better controlled with amlodipine/metoprolol.  CAD-s/p CABG No anginal symptoms See below regarding antiplatelets. Continue statin/beta-blocker  History of CVA No obvious deficits on exam-MRI brain negative Continue statin See below regarding antiplatelets.  PAF Sinus rhythm Eliquis held on 5/25-on IV heparin currently until all surgical procedures are completed.  PAD Has had numerous procedures in the past-follow with Dr. Myra Gianotti Prior to this hospitalization he was on aspirin/Plavix/Eliquis Discussed with Dr. Myra Gianotti on 5/24-okay to stop Plavix (as he is on Eliquis) Remains on aspirin/statin  DM-2 CBGs relatively stable Increase Semglee to 14 units, continue to hold metformin-May need to stop given metabolic acidosis on presentation.     Recent Labs    01/19/23 1631 01/19/23 2212 01/20/23 0718  GLUCAP 298* 283* 213*    Hyponatremia Suspect this is the excessive free water intake Continue  fluid restriction Start furosemide  Repeat electrolytes tomorrow.  COPD Stable Bronchodilators  BMI: Estimated body mass index is 18.12 kg/m as calculated from the following:   Height as of this encounter: 5\' 1"  (1.549 m).    Weight as of this encounter: 43.5 kg.   Code status:   Code Status: DNR   DVT Prophylaxis: IV heparin gtt   Family Communication: Niece-[name] [phone]-left VM on 5/25, 5/26,5/27   Disposition Plan: Status is: Observation The patient will require care spanning > 2 midnights and should be moved to inpatient because: Severity of illness.   Planned Discharge Destination:Home health   Diet: Diet Order             Diet heart healthy/carb modified Fluid consistency: Thin; Fluid restriction: 1500 mL Fluid  Diet effective now                     Antimicrobial agents: Anti-infectives (From admission, onward)    Start     Dose/Rate Route Frequency Ordered Stop   01/19/23 1130  ciprofloxacin (CIPRO) IVPB 400 mg        400 mg 200 mL/hr over 60 Minutes Intravenous  Once 01/19/23 1037 01/19/23 1230   01/14/23 1745  cefTRIAXone (ROCEPHIN) 1 g in sodium chloride 0.9 % 100 mL IVPB        1 g 200 mL/hr over 30 Minutes Intravenous  Once 01/14/23 1741 01/14/23 1922   01/14/23 1745  azithromycin (ZITHROMAX) 500 mg in sodium chloride 0.9 % 250 mL IVPB        500 mg 250 mL/hr over 60 Minutes Intravenous  Once 01/14/23 1741 01/14/23 2315        MEDICATIONS: Scheduled Meds:   stroke: early stages of recovery book   Does not apply Once   amLODipine  5 mg Oral Daily   aspirin EC  81 mg Oral Daily   feeding supplement  237 mL Oral BID BM   gabapentin  200 mg Oral BID   insulin aspart  0-15 Units Subcutaneous TID WC   insulin aspart  0-5 Units Subcutaneous QHS   insulin glargine-yfgn  10 Units Subcutaneous Daily   metoprolol succinate  25 mg Oral Daily   multivitamin with minerals  1 tablet Oral Daily   pantoprazole  40 mg Oral Daily   rosuvastatin  20 mg Oral Daily   umeclidinium bromide  1 puff Inhalation QHS   Continuous Infusions:  heparin 900 Units/hr (01/19/23 1603)   PRN Meds:.acetaminophen **OR** acetaminophen (TYLENOL) oral liquid 160 mg/5 mL **OR**  acetaminophen, albuterol, alum & mag hydroxide-simeth, hydrALAZINE, ondansetron (ZOFRAN) IV   I have personally reviewed following labs and imaging studies  LABORATORY DATA: CBC: Recent Labs  Lab 01/14/23 1120 01/14/23 1129 01/16/23 0708 01/17/23 0346 01/18/23 0345 01/19/23 0200 01/20/23 0401  WBC 6.5   < > 8.4 8.4 9.5 10.3 5.6  NEUTROABS 5.3  --   --   --   --   --   --   HGB 9.4*   < > 9.9* 9.3* 9.5* 8.3* 8.3*  HCT 30.3*   < > 30.1* 28.7* 30.3* 25.2* 25.5*  MCV 91.0   < > 86.5 88.0 90.7 89.4 87.6  PLT 271   < > 287 285 293 261 262   < > = values in this interval not displayed.     Basic Metabolic Panel: Recent Labs  Lab 01/15/23 0717 01/16/23 0708 01/18/23 0345 01/19/23 0200 01/20/23 0401  NA 131* 131* 128*  123* 125*  K 3.5 3.5 3.9 3.8 5.2*  CL 101 101 99 91* 88*  CO2 19* 19* 20* 23 24  GLUCOSE 202* 302* 173* 229* 217*  BUN 16 19 12 18 22   CREATININE 0.95 0.98 0.92 0.99 1.07  CALCIUM 8.6* 8.9 8.3* 7.6* 9.3     GFR: Estimated Creatinine Clearance: 37.3 mL/min (by C-G formula based on SCr of 1.07 mg/dL).  Liver Function Tests: Recent Labs  Lab 01/14/23 1120 01/15/23 0717 01/16/23 0708 01/18/23 0345  AST 37 29 32 29  ALT 17 19 22 19   ALKPHOS 64 55 73 47  BILITOT 0.9 0.5 0.6 0.2*  PROT 6.7 6.2* 6.2* 5.9*  ALBUMIN 3.6 3.3* 3.3* 2.9*    Recent Labs  Lab 01/14/23 1836  LIPASE 22    No results for input(s): "AMMONIA" in the last 168 hours.  Coagulation Profile: Recent Labs  Lab 01/14/23 1120  INR 1.2     Cardiac Enzymes: No results for input(s): "CKTOTAL", "CKMB", "CKMBINDEX", "TROPONINI" in the last 168 hours.  BNP (last 3 results) No results for input(s): "PROBNP" in the last 8760 hours.  Lipid Profile: No results for input(s): "CHOL", "HDL", "LDLCALC", "TRIG", "CHOLHDL", "LDLDIRECT" in the last 72 hours.   Thyroid Function Tests: No results for input(s): "TSH", "T4TOTAL", "FREET4", "T3FREE", "THYROIDAB" in the last 72  hours.  Anemia Panel: No results for input(s): "VITAMINB12", "FOLATE", "FERRITIN", "TIBC", "IRON", "RETICCTPCT" in the last 72 hours.  Urine analysis:    Component Value Date/Time   COLORURINE YELLOW 01/14/2023 1059   APPEARANCEUR CLEAR 01/14/2023 1059   LABSPEC 1.017 01/14/2023 1059   PHURINE 5.0 01/14/2023 1059   GLUCOSEU NEGATIVE 01/14/2023 1059   HGBUR SMALL (A) 01/14/2023 1059   BILIRUBINUR NEGATIVE 01/14/2023 1059   KETONESUR 5 (A) 01/14/2023 1059   PROTEINUR 100 (A) 01/14/2023 1059   UROBILINOGEN 0.2 07/07/2015 1842   NITRITE NEGATIVE 01/14/2023 1059   LEUKOCYTESUR NEGATIVE 01/14/2023 1059    Sepsis Labs: Lactic Acid, Venous    Component Value Date/Time   LATICACIDVEN 1.6 01/10/2022 1854    MICROBIOLOGY: No results found for this or any previous visit (from the past 240 hour(s)).  RADIOLOGY STUDIES/RESULTS: DG ERCP  Result Date: 01/19/2023 CLINICAL DATA:  Elective surgery Cholelithiasis EXAM: ERCP TECHNIQUE: Multiple spot images obtained with the fluoroscopic device and submitted for interpretation post-procedure. FLUOROSCOPY: Radiation Exposure Index (as provided by the fluoroscopic device): 8.8 mGy Kerma COMPARISON:  CT abdomen pelvis 01/14/2023 FINDINGS: Seven intraoperative fluoroscopic images were submitted for interpretation. The submitted images demonstrate moderately dilated intra and extrahepatic bile ducts. Three small filling defects seen in the distal common bile duct on the final image may be artifact, however small choledocholiths are difficult to exclude. IMPRESSION: Intraoperative fluoroscopic images of ERCP demonstrate moderately dilated intra and extrahepatic bile ducts. These images were submitted for radiologic interpretation only. Please see the procedural report for the amount of contrast and the fluoroscopy time utilized. Electronically Signed   By: Acquanetta Belling M.D.   On: 01/19/2023 14:07     LOS: 5 days   [name] Massed, MD  [facility]    To contact the attending provider between 7A-7P or the covering provider during after hours 7P-7A, please log into the web site [email or web address] and access using universal [place] password for that web site. If you do not have the password, please call the hospital operator.  01/20/2023, 10:29 AM

## 2023-01-20 NOTE — Progress Notes (Signed)
ANTICOAGULATION CONSULT NOTE - Follow Up Consult  Pharmacy Consult for heparin Indication: atrial fibrillation in setting of possible cholecystecomy   Allergies  Allergen Reactions   Actos [Pioglitazone] Shortness Of Breath    Leg swelling    Lipitor [Atorvastatin] Other (See Comments)    Leg pain    Zestril [Lisinopril] Cough   Patient Measurements: Height: 5\' 1"  (154.9 cm) Weight: 43.5 kg (95 lb 14.4 oz) IBW/kg (Calculated) : 52.3 kg Heparin Dosing Weight: 43.5 kg  Vital Signs: Temp: 98.3 F (36.8 C) (05/29 0802) Temp Source: Oral (05/29 0802) BP: 168/76 (05/29 0400) Pulse Rate: 96 (05/29 0400)  Labs: Recent Labs    01/17/23 2140 01/18/23 0345 01/18/23 0345 01/19/23 0200 01/20/23 0401  HGB  --  9.5*   < > 8.3* 8.3*  HCT  --  30.3*  --  25.2* 25.5*  PLT  --  293  --  261 262  APTT 86* 98*  --  79*  --   HEPARINUNFRC  --  0.91*  --  0.40 0.35  CREATININE  --  0.92  --  0.99 1.07   < > = values in this interval not displayed.    Estimated Creatinine Clearance: 37.3 mL/min (by C-G formula based on SCr of 1.07 mg/dL).  Medications:  Infusions:   heparin 900 Units/hr (01/19/23 1603)    Assessment: 72 YO M admitted with PMH of atrial fibrillation on Eliquis prior to admission. Pharmacy consulted to transition to heparin in setting to possible ERCP. Last dose of apixaban 5/24 at 2100. Will monitor aPTT until correlating with heparin level. aPTT this morning prior to turning heparin off was therapeutic at 79, correlating with heparin level 0.4 on 900 units/hr. Hgb 8.3, plt 261. No line issues or signs/symptoms of bleeding noted.   Patient is now s/p ERCP 5/28; Cont heparin for now for possible cholecystectomy. Heparin level is therapeutic.   Hgb ~8s, plt wnl  Goal of Therapy:  Heparin level 0.3-0.7 units/ml aPTT 66-102 seconds Monitor platelets by anticoagulation protocol: Yes   Plan:  Cont heparin 900 units/hr. Daily CBC, heparin level. Monitor for  signs/symptoms of bleeding. Follow up further invasive workup and when safe to resume apixaban  Ulyses Southward, PharmD, BCIDP, AAHIVP, CPP Infectious Disease Pharmacist 01/20/2023 11:54 AM

## 2023-01-20 NOTE — Progress Notes (Signed)
Physical Therapy Treatment Patient Details Name: Reginald Ayers MRN: [id number] DOB: [birth year] Today's Date: 01/20/2023   History of Present Illness 72 y/o M admitted to [facility] on 5/23 for weakness and pain in his RLE, pt found in fecal matter and bedbugs. MRI of brain with no acute abnormality, chronically occluded left ICA. CT of abdomen/pelvis with mild groundglass denisty in the RLL, distended gallbladder without calcified stone, dilated CBD with suspicion for intrahepatic dilatation. PMHx: COPD, CAD s/p CABG, DM, HTN, HLD, PAD s/p stent, tobacco dependence.    PT Comments    Pt greeted resting in bed and agreeable to session with encouragement with slow but steady progress towards acute goals. Pt continues to be limited by global weakness, RLE pain, decreased activity tolerance, and impaired balance/postural reactions. Pt requiring grossly min A to come to sitting EOB, transfers and gait with RW for support with pt demonstrating antalgic step-to gait pattern needing cues for RW proximity, sequencing and general attention to task as pt verbose and easily distractible. Current plan remains appropriate to address deficits and maximize functional independence and decrease caregiver burden. Pt continues to benefit from skilled PT services to progress toward functional mobility goals.    Recommendations for follow up therapy are one component of a multi-disciplinary discharge planning process, led by the attending physician.  Recommendations may be updated based on patient status, additional functional criteria and insurance authorization.  Follow Up Recommendations  Can patient physically be transported by private vehicle: Yes    Assistance Recommended at Discharge Frequent or constant Supervision/Assistance  Patient can return home with the following A lot of help with walking and/or transfers;Assistance with cooking/housework;Assist for transportation;Help with stairs or ramp for entrance;A lot of  help with bathing/dressing/bathroom   Equipment Recommendations  Other (comment) (defer to next level)    Recommendations for Other Services       Precautions / Restrictions Precautions Precautions: Fall Precaution Comments: bed bugs Restrictions Weight Bearing Restrictions: No     Mobility  Bed Mobility Overal bed mobility: Needs Assistance Bed Mobility: Supine to Sit, Sit to Supine     Supine to sit: Min assist     General bed mobility comments: min A to elevate trunk with pt reaching out hand for asssit, increased time to scoot out to EOB    Transfers Overall transfer level: Needs assistance Equipment used: Rolling walker (2 wheels) Transfers: Sit to/from Stand Sit to Stand: Min assist           General transfer comment: min A to power up and steady on rise, continued cues for hand placement    Ambulation/Gait Ambulation/Gait assistance: Min assist Gait Distance (Feet): 16 Feet Assistive device: Rolling walker (2 wheels) Gait Pattern/deviations: Trunk flexed, Drifts right/left, Shuffle, Decreased dorsiflexion - left, Decreased dorsiflexion - right, Decreased stride length, Leaning posteriorly, Step-to pattern Gait velocity: decreased     General Gait Details: minA for balance and strength, decreased B step length and foot clearance, mild posterior lean intermittently requiring assist to correct. Increased time required as pt verbose throughout, cues to attend to task   Stairs             Wheelchair Mobility    Modified Rankin (Stroke Patients Only)       Balance Overall balance assessment: Needs assistance Sitting-balance support: Feet supported Sitting balance-Leahy Scale: Fair   Postural control: Posterior lean Standing balance support: Bilateral upper extremity supported, Reliant on assistive device for balance, During functional activity Standing balance-Leahy Scale: Poor Standing  balance comment: reliant on RW and intermittent  external support for balance                            Cognition Arousal/Alertness: Awake/alert Behavior During Therapy: WFL for tasks assessed/performed Overall Cognitive Status: No family/caregiver present to determine baseline cognitive functioning                                 General Comments: A&Ox4, follows 1 step commands. can be verbose        Exercises      General Comments General comments (skin integrity, edema, etc.): VSS on RA, RLE red and inflammed      Pertinent Vitals/Pain Pain Assessment Pain Assessment: Faces Faces Pain Scale: Hurts little more Pain Descriptors / Indicators: Sore, Burning, Aching Pain Intervention(s): Monitored during session, Limited activity within patient's tolerance, Patient requesting pain meds-RN notified, Ice applied, Repositioned    Home Living                          Prior Function            PT Goals (current goals can now be found in the care plan section) Acute Rehab PT Goals Patient Stated Goal: to go home PT Goal Formulation: With patient Time For Goal Achievement: 01/29/23 Progress towards PT goals: Progressing toward goals    Frequency    Min 3X/week      PT Plan      Co-evaluation              AM-PAC PT "6 Clicks" Mobility   Outcome Measure  Help needed turning from your back to your side while in a flat bed without using bedrails?: A Little Help needed moving from lying on your back to sitting on the side of a flat bed without using bedrails?: A Lot Help needed moving to and from a bed to a chair (including a wheelchair)?: A Little Help needed standing up from a chair using your arms (e.g., wheelchair or bedside chair)?: A Little Help needed to walk in hospital room?: A Little Help needed climbing 3-5 steps with a railing? : Total 6 Click Score: 15    End of Session Equipment Utilized During Treatment: Gait belt Activity Tolerance: Patient tolerated  treatment well Patient left: with call bell/phone within reach;in chair Nurse Communication: Mobility status;Patient requests pain meds PT Visit Diagnosis: Unsteadiness on feet (R26.81);Muscle weakness (generalized) (M62.81);History of falling (Z91.81);Difficulty in walking, not elsewhere classified (R26.2)     Time: 1610-9604 PT Time Calculation (min) (ACUTE ONLY): 38 min  Charges:  $Gait Training: 23-37 mins $Therapeutic Activity: 8-22 mins                    Suhey Radford R. PTA Acute Rehabilitation Services Office: [phone]   Catalina Antigua 01/20/2023, 1:19 PM

## 2023-01-20 NOTE — Consult Note (Addendum)
Cardiology Consultation   Patient ID: KAZUO DURAY MRN: [id number]; DOB: [birth year]  Admit date: 01/14/2023 Date of Consult: 01/20/2023  PCP:  Loura Back, NP   [facility] Providers Cardiologist:  Nanetta Batty, MD        Patient Profile:   Reginald Ayers is a 72 y.o. male with a hx of CAD s/p CABG (2010 LIMA-LAD, left radial-OM 1, SVG-RI, SVG-acute marginal-distal RCA), paroxysmal afib, hypertension, hyperlipidemia, significant PAD s/p stents, B/l carotid artery disease (s/p RICA stent, total occlusion L ICA on [place] Jan 2024), CVA, DM II who is being seen 01/20/2023 for the evaluation of surgical risk stratification at the request of Leary Roca PA-C with [facility].  History of Present Illness:   Mr. Mutti presented to the ED on 5/23 in a disheveled state with fecal matter and bedbugs seen by ED staff. He was noted to have confusion and slurred speech, along with RLE pain and weakness. Patient also with diarrhea. Both CT head and MRI brain were without acute abnormality (brain MR confirmed chronically occluded left ICA). Labs suspicious for presumed starvation ketoacidosis, anion gap metabolic acidosis. CT abdomen pelvis was obtained and showed distended gallbladder without calcified stone, dilated common bile duct 19mm with suspicion for intrahepatic dilation. Patient also underwent MRCP on 5/24 and was found with severe intrahepatic/extrahepatic biliary ductal dilation. Tiny gallstones and sludge were noted within cystic duct and GI completed ERCP with sphincterotomy and sludge removal on 5/28. Patient's primary team consulted surgery today for evaluation of possible laparoscopic cholecystectomy. Surgery saw the patient and recommended cardiac evaluation for peri-operative risk stratification.   Patient with notable cardiac and vascular history. CABG in 2010 with Dr. [name] (LIMA-LAD, left radial-OM 1, SVG-RI, SVG-acute marginal-distal RCA). He has also been  followed by Dr. Allyson Sabal and vascular surgery for significant PAD that includes known carotid artery disease including left ICA, right ICA. Patient has had right superficial femoral artery stenting in January 2018, atherectomy with angioplasty of right posterior tibial artery 11/2016, drug-coated balloon angioplasty of right superficial femoral artery and balloon angioplasty of the right common femoral artery in 03/2017.  Diagnostic angiogram with patent right superficial femoral artery and flush occlusion of this left superficial femoral artery in January 2019. Right carotid stent (TCAR) in May 2023. Recanalization of the right posterior tibial artery and subsequent balloon angioplasty in August 2023. Also balloon angioplasty of right peroneal artery with recanalization of posterior tibial artery with balloon angioplasty.   Patient appears to have been lost to cardiology follow up. Last seen in office in 2022, was also seen in hospital for pre-operative consult in 2023.   Patient is limited as a historian and his answers to questions are somewhat vague. His knowledge of his medications is also limited. I think notable hearing loss contributes as well. He reports limited activity at baseline but is able to mobilize around his house via walker without chest pain or dyspnea. Denies palpitations or symptoms of recurrent afib. His primary complaint when I asked today is chronic lower extremity pain/claudication. Has not had significant GI symptoms per his description of recent health today. Regarding medications, patient tells me that he's "not taking any heart medications" but then also says that he does take his statin and beta blocker. When asked about ASA, Plavix, and Eliquis, it is not clear what he has been taking in recent weeks.  Past Medical History:  Diagnosis Date   Carotid artery disease (HCC)    L-ICA 100%, mod  R-ICA dz   COPD (chronic obstructive pulmonary disease) (HCC)    Coronary artery disease     s/p CABG February 2010 by Dr. Andrey Spearman   Diabetes [facility])    Hyperlipidemia    Hypertension    Peripheral arterial disease (HCC)    post left common iliac and right SFA stenting remotely   Tobacco abuse         Past Surgical History:  Procedure Laterality Date   ABDOMINAL AORTOGRAM N/A 04/20/2017   Procedure: ABDOMINAL AORTOGRAM;  Surgeon: Nada Libman, MD;  Location: [facility];  Service: Cardiovascular;  Laterality: N/A;   ABDOMINAL AORTOGRAM W/LOWER EXTREMITY Right 12/08/2016   Procedure: Abdominal Aortogram w/Lower Extremity;  Surgeon: Nada Libman, MD;  Location: [facility];  Service: Cardiovascular;  Laterality: Right;   ABDOMINAL AORTOGRAM W/LOWER EXTREMITY N/A 09/07/2017   Procedure: ABDOMINAL AORTOGRAM W/LOWER EXTREMITY;  Surgeon: Nada Libman, MD;  Location: [facility];  Service: Cardiovascular;  Laterality: N/A;   ABDOMINAL AORTOGRAM W/LOWER EXTREMITY N/A 03/31/2022   Procedure: ABDOMINAL AORTOGRAM W/LOWER EXTREMITY;  Surgeon: Nada Libman, MD;  Location: [facility];  Service: Cardiovascular;  Laterality: N/A;   ABDOMINAL AORTOGRAM W/LOWER EXTREMITY Right 09/29/2022   Procedure: ABDOMINAL AORTOGRAM W/LOWER EXTREMITY;  Surgeon: Nada Libman, MD;  [facility]: [facility];  Service: Cardiovascular;  Laterality: Right;   CARDIAC CATHETERIZATION  2010   CAROTID ANGIOGRAM  2014   CORONARY ARTERY BYPASS GRAFT  2010   LIMA-LAD, Lrad-OM1, SVG-RI, SVG-AM-dRCA   DOPPLER ECHOCARDIOGRAPHY  2010   LOWER EXTREMITY ANGIOGRAM  11/2006   left common femoral endarterectomy and patch angioplasty:The mid right SFA was angioplastied with a 4 x 8   LOWER EXTREMITY ANGIOGRAM  2003   left common iliac artery stenting by Dr. Erlene Quan   LOWER EXTREMITY ANGIOGRAPHY Right 04/20/2017   Procedure: Lower Extremity Angiography;  Surgeon: Nada Libman, MD;  Location: [facility] INVASIVE CV LAB;  Service: Cardiovascular;  Laterality: Right;   NM MYOVIEW  LTD  2015   PERIPHERAL VASCULAR ATHERECTOMY Right 12/08/2016   Procedure: Peripheral Vascular Atherectomy;  Surgeon: Nada Libman, MD;  [facility]: [facility];  Service: Cardiovascular;  Laterality: Right;   PERIPHERAL VASCULAR BALLOON ANGIOPLASTY Right 12/08/2016   Procedure: Peripheral Vascular Balloon Angioplasty;  Surgeon: Nada Libman, MD;  [facility];  Service: Cardiovascular;  Laterality: Right;   PERIPHERAL VASCULAR BALLOON ANGIOPLASTY Right 04/20/2017   Procedure: PERIPHERAL VASCULAR BALLOON ANGIOPLASTY;  Surgeon: Nada Libman, MD;  [facility]: [facility];  Service: Cardiovascular;  Laterality: Right;  SFA   PERIPHERAL VASCULAR BALLOON ANGIOPLASTY  03/31/2022   Procedure: PERIPHERAL VASCULAR BALLOON ANGIOPLASTY;  Surgeon: Nada Libman, MD;  [facility]: [facility];  Service: Cardiovascular;;  Right PT   PERIPHERAL VASCULAR BALLOON ANGIOPLASTY  09/29/2022   Procedure: PERIPHERAL VASCULAR BALLOON ANGIOPLASTY;  Surgeon: Nada Libman, MD;  [facility]: [facility];  Service: Cardiovascular;;  Rt PT and Peroneal   PERIPHERAL VASCULAR CATHETERIZATION N/A 09/22/2016   Procedure: Abdominal Aortogram w/ bilateral Lower Extremity Runoff;  Surgeon: Nada Libman, MD;  [facility]: [facility];  Service: Cardiovascular;  Laterality: N/A;   PERIPHERAL VASCULAR CATHETERIZATION Right 09/22/2016   Procedure: Peripheral Vascular Intervention;  Surgeon: Nada Libman, MD;  [facility]: [facility];  Service: Cardiovascular;  Laterality: Right;   PERIPHERAL VASCULAR INTERVENTION  09/29/2022   Procedure: PERIPHERAL VASCULAR INTERVENTION;  Surgeon: Nada Libman, MD;  [facility]: [facility];  Service: Cardiovascular;;  Rt Popliteal   TRANSCAROTID ARTERY REVASCULARIZATION  Right 12/26/2021   Procedure: Right Transcarotid Artery Revascularization;  Surgeon: Nada Libman, MD;  [facility];  Service: Vascular;  Laterality: Right;    ULTRASOUND GUIDANCE FOR VASCULAR ACCESS Left 12/26/2021   Procedure: ULTRASOUND GUIDANCE FOR VASCULAR ACCESS, LEFT FEMORAL VEIN;  Surgeon: Nada Libman, MD;  Location: [facility] OR;  Service: Vascular;  Laterality: Left;     Home Medications:  Prior to Admission medications   Medication Sig Start Date End Date Taking? Authorizing Provider  acetaminophen (TYLENOL) 650 MG CR tablet Take 1,300 mg by mouth every 8 (eight) hours as needed for pain.    [provider]  albuterol (VENTOLIN HFA) 108 (90 Base) MCG/ACT inhaler Inhale 2 puffs into the lungs every 4 (four) hours as needed for shortness of breath or wheezing. 01/18/20   [provider]  apixaban (ELIQUIS) 2.5 MG TABS tablet Take 1 tablet (2.5 mg total) by mouth 2 (two) times daily. 06/25/22   Runell Gess, MD  aspirin 81 MG EC tablet Take 1 tablet (81 mg total) by mouth daily. Swallow whole. 12/31/21   Leroy Sea, MD  Blood Glucose Monitoring Suppl (BLOOD GLUCOSE MONITOR SYSTEM) w/Device KIT Used to test blood sugars 4 times daily 12/31/21   Leroy Sea, MD  clopidogrel (PLAVIX) 75 MG tablet Take 1 tablet (75 mg total) by mouth daily. 09/29/22   Nada Libman, MD  Cyanocobalamin (VITAMIN B-12 PO) Take 1 tablet by mouth daily.    [provider]  ferrous sulfate 325 (65 FE) MG tablet Take 325 mg by mouth daily with breakfast.    [provider]  Insulin Pen Needle 32G X 4 MM MISC Use to inject insulin 4 times daily. 12/31/21   Leroy Sea, MD  metFORMIN (GLUCOPHAGE) 500 MG tablet Take 1,000 mg by mouth 2 (two) times daily with a meal. 07/24/13   [provider]  metoprolol succinate (TOPROL-XL) 25 MG 24 hr tablet TAKE 1 TABLET(25 MG) BY MOUTH DAILY 07/29/20   Runell Gess, MD  Multiple Vitamins-Minerals (CENTRUM SILVER 50+MEN) TABS Take 1 tablet by mouth daily.    [provider]  NITROSTAT 0.4 MG SL tablet Take 0.4 mg by mouth every 5 (five) minutes as needed for chest  pain. 12/18/14   [provider]  omeprazole (PRILOSEC) 20 MG capsule Take 20 mg by mouth daily.    [provider]  oxymetazoline (AFRIN) 0.05 % nasal spray Place 1 spray into both nostrils 2 (two) times daily as needed for congestion. Mucinex brand    [provider]  potassium chloride (K-DUR) 10 MEQ tablet Take 10 mEq by mouth daily with breakfast.    [provider]  rosuvastatin (CRESTOR) 20 MG tablet Take 1 tablet (20 mg total) by mouth daily. 12/31/21   Leroy Sea, MD  SPIRIVA HANDIHALER 18 MCG inhalation capsule Place 18 mcg into inhaler and inhale at bedtime. 08/16/13   [provider]  traMADol (ULTRAM) 50 MG tablet Take 50 mg by mouth 3 (three) times daily as needed for pain. 03/06/22   [provider]    Inpatient Medications: Scheduled Meds:   stroke: early stages of recovery book   Does not apply Once   amLODipine  5 mg Oral Daily   aspirin EC  81 mg Oral Daily   feeding supplement  237  mL Oral BID BM   gabapentin  200 mg Oral BID   insulin aspart  0-15 Units Subcutaneous TID WC   insulin aspart  0-5 Units Subcutaneous QHS   insulin glargine-yfgn  10 Units Subcutaneous Daily   metoprolol succinate  25 mg Oral Daily   multivitamin with minerals  1 tablet Oral Daily   pantoprazole  40 mg Oral Daily   rosuvastatin  20 mg Oral Daily   umeclidinium bromide  1 puff Inhalation QHS   Continuous Infusions:  heparin 900 Units/hr (01/19/23 1603)   PRN Meds: acetaminophen **OR** acetaminophen (TYLENOL) oral liquid 160 mg/5 mL **OR** acetaminophen, albuterol, alum & mag hydroxide-simeth, hydrALAZINE, ondansetron (ZOFRAN) IV  Allergies:    Allergies  Allergen Reactions   Actos [Pioglitazone] Shortness Of Breath    Leg swelling    Lipitor [Atorvastatin] Other (See Comments)    Leg pain    Zestril [Lisinopril] Cough    Social History:   Social History   Socioeconomic History   Marital status: Single    Spouse name:  Not on file   Number of children: Not on file   Years of education: Not on file   Highest education level: Not on file  Occupational History   Occupation: Retired Radiographer, therapeutic  Tobacco Use   Smoking status: Former    Packs/day: 0    Types: Cigarettes    Quit date: 08/07/2016    Years since quitting: 6.4   Smokeless tobacco: Current    Types: Chew  Vaping Use   Vaping Use: Never used  Substance and Sexual Activity   Alcohol use: No    Alcohol/week: 0.0 standard drinks of alcohol    Comment: Weekend drinker, quit 2000. NIGHTLY nyquill   Drug use: No   Sexual activity: Not on file  Other Topics Concern   Not on file  Social History Narrative   Lives in [place]. Has 2 roommates, they both smoke. Has 9 dogs.   Social Determinants of Health   Financial Resource Strain: Not on file  Food Insecurity: Not on file  Transportation Needs: Not on file  Physical Activity: Not on file  Stress: Not on file  Social Connections: Not on file  Intimate Partner Violence: Not on file    Family History:    Family History  Problem Relation Age of Onset   Heart attack Mother    CVA Mother    Heart disease Mother    Diabetes Sister    Hypertension Sister      ROS:  Please see the history of present illness.   All other ROS reviewed and negative.     Physical Exam/Data:   Vitals:   01/20/23 0400 01/20/23 0802 01/20/23 1206 01/20/23 1600  BP: (!) 168/76   (!) 125/55  Pulse: 96   96  Resp: 16   20  Temp:  98.3 F (36.8 C) 98.1 F (36.7 C) 98.2 F (36.8 C)  TempSrc:  Oral Oral Oral  SpO2: 92%   96%  Weight:      Height:        Intake/Output Summary (Last 24 hours) at 01/20/2023 1634 Last data filed at 01/20/2023 1500 Gross per 24 hour  Intake 960 ml  Output 2950 ml  Net -1990 ml      01/19/2023   12:11 PM 01/14/2023   10:52 AM 11/23/2022    1:37 PM  Last 3 Weights  Weight (lbs) 95 lb 14.4 oz 95 lb 14.4 oz 96 lb  Weight (  kg) 43.5 kg 43.5 kg 43.545 kg     Body mass index is  18.12 kg/m.  General:  Chronically ill, frail with evidence of poor nutrition. Very hard of hearing. HEENT: normal Neck: no JVD Vascular: No carotid bruits; Distal pulses 2+ bilaterally Cardiac:  normal S1, S2; RRR; no murmur Lungs:  clear to auscultation bilaterally, no wheezing, rhonchi or rales  Abd: soft, nontender, no hepatomegaly  Ext: no edema Musculoskeletal:  No deformities, BUE and BLE strength normal and equal Skin: warm and dry  Neuro:  CNs 2-12 intact, no focal abnormalities noted Psych:  Normal affect   EKG:  The EKG was personally reviewed and demonstrates:  sinus rhythm with isolated PAC. Telemetry:  Telemetry was personally reviewed and demonstrates:  sinus rhythm/sinus tachycardia  Relevant CV Studies:  01/15/23 TTE  IMPRESSIONS     1. Technically difficult study with limited visualizaiton of cardiac  structures.   2. Left ventricular ejection fraction, by estimation, is 50 to 55%. The  left ventricle has low normal function. The left ventricle demonstrates  regional wall motion abnormalities (see scoring diagram/findings for  description). The LV apex is dyskinetic  and aneurysmal.   3. Right ventricular systolic function is normal. The right ventricular  size is normal.   4. The mitral valve is normal in structure. No evidence of mitral valve  regurgitation. No evidence of mitral stenosis.   5. The aortic valve is normal in structure. There is mild calcification  of the aortic valve. Aortic valve regurgitation is not visualized. No  aortic stenosis is present.   6. The inferior vena cava is normal in size with greater than 50%  respiratory variability, suggesting right atrial pressure of 3 mmHg.   FINDINGS   Left Ventricle: Left ventricular ejection fraction, by estimation, is 50  to 55%. The left ventricle has low normal function. The left ventricle  demonstrates regional wall motion abnormalities. Definity contrast agent  was given IV to delineate  the left  ventricular endocardial borders. The left ventricular internal cavity size  was normal in size. There is no left ventricular hypertrophy. Left  ventricular diastolic parameters are consistent with Grade I diastolic  dysfunction (impaired relaxation).     LV Wall Scoring:  The apex is dyskinetic.   Right Ventricle: The right ventricular size is normal. No increase in  right ventricular wall thickness. Right ventricular systolic function is  normal.   Left Atrium: Left atrial size was normal in size.   Right Atrium: Right atrial size was normal in size.   Pericardium: There is no evidence of pericardial effusion.   Mitral Valve: The mitral valve is normal in structure. No evidence of  mitral valve regurgitation. No evidence of mitral valve stenosis.   Tricuspid Valve: The tricuspid valve is normal in structure. Tricuspid  valve regurgitation is not demonstrated. No evidence of tricuspid  stenosis.   Aortic Valve: The aortic valve is normal in structure. There is mild  calcification of the aortic valve. Aortic valve regurgitation is not  visualized. No aortic stenosis is present.   Pulmonic Valve: The pulmonic valve was normal in structure. Pulmonic valve  regurgitation is not visualized. No evidence of pulmonic stenosis.   Aorta: The aortic root is normal in size and structure.   Venous: The inferior vena cava is normal in size with greater than 50%  respiratory variability, suggesting right atrial pressure of 3 mmHg.   IAS/Shunts: No atrial level shunt detected by color flow Doppler.  Laboratory Data:  High Sensitivity Troponin:   Recent Labs  Lab 01/14/23 1120 01/14/23 1344  TROPONINIHS 16 16     Chemistry Recent Labs  Lab 01/18/23 0345 01/19/23 0200 01/20/23 0401  NA 128* 123* 125*  K 3.9 3.8 5.2*  CL 99 91* 88*  CO2 20* 23 24  GLUCOSE 173* 229* 217*  BUN 12 18 22   CREATININE 0.92 0.99 1.07  CALCIUM 8.3* 7.6* 9.3  GFRNONAA >60 >60 >60   ANIONGAP 9 9 13     Recent Labs  Lab 01/15/23 0717 01/16/23 0708 01/18/23 0345  PROT 6.2* 6.2* 5.9*  ALBUMIN 3.3* 3.3* 2.9*  AST 29 32 29  ALT 19 22 19   ALKPHOS 55 73 47  BILITOT 0.5 0.6 0.2*   Lipids  Recent Labs  Lab 01/15/23 0428  CHOL 127  TRIG 179*  HDL 28*  LDLCALC 63  CHOLHDL 4.5    Hematology Recent Labs  Lab 01/18/23 0345 01/19/23 0200 01/20/23 0401  WBC 9.5 10.3 5.6  RBC 3.34* 2.82* 2.91*  HGB 9.5* 8.3* 8.3*  HCT 30.3* 25.2* 25.5*  MCV 90.7 89.4 87.6  MCH 28.4 29.4 28.5  MCHC 31.4 32.9 32.5  RDW 14.5 14.3 14.0  PLT 293 261 262   Thyroid No results for input(s): "TSH", "FREET4" in the last 168 hours.  BNPNo results for input(s): "BNP", "PROBNP" in the last 168 hours.  DDimer No results for input(s): "DDIMER" in the last 168 hours.   Radiology/Studies:  DG ERCP  Result Date: 01/19/2023 CLINICAL DATA:  Elective surgery Cholelithiasis EXAM: ERCP TECHNIQUE: Multiple spot images obtained with the fluoroscopic device and submitted for interpretation post-procedure. FLUOROSCOPY: Radiation Exposure Index (as provided by the fluoroscopic device): 8.8 mGy Kerma COMPARISON:  CT abdomen pelvis 01/14/2023 FINDINGS: Seven intraoperative fluoroscopic images were submitted for interpretation. The submitted images demonstrate moderately dilated intra and extrahepatic bile ducts. Three small filling defects seen in the distal common bile duct on the final image may be artifact, however small choledocholiths are difficult to exclude. IMPRESSION: Intraoperative fluoroscopic images of ERCP demonstrate moderately dilated intra and extrahepatic bile ducts. These images were submitted for radiologic interpretation only. Please see the procedural report for the amount of contrast and the fluoroscopy time utilized. Electronically Signed   By: Acquanetta Belling M.D.   On: 01/19/2023 14:07     Assessment and Plan:   Preoperative evaluation S/P ERCP with sludge removal  Given  patient's complex co morbidities, patient will be high risk with any procedure. Per TTE this admission, LVEF overall stable at 50-55% with stable apical WMA. Last stress test in 2019 with findings consistent with prior myocardial infarction with peri-infarct ischemia, intermediate risk study. At baseline, patient ambulates around his house with the assistance of a walker and denies chest pain but overall very sedentary, likely not 4.0 METS. I do not think additional cardiovascular testing at this time would mitigate his surgical risk. MACE at least 11%.   Hx paroxysmal afib with RVR  Patient with afib noted during 2023 admission. Patient had spontaneous conversion to sinus tachycardia and was managed with beta blocker/Eliquis. Per patient report, no symptoms to indicate subsequent recurrence.  No arrhythmia on telemetry Continue Metoprolol succinate 25mg  Continue heparin bridge with possible surgery On my interview with patient today, very unclear whether he has been compliant with Eliquis. High concern for medication non-compliance due to decreased medical literacy. Would favor ongoing management with Eliquis and Plavix.  CAD s/p CABG 2010  2010 LIMA-LAD, left radial-OM 1,  SVG-RI, SVG-acute marginal-distal RCA. Has previously been intolerant to Lipitor but appears to have been switched to Crestor last year.   Continue Crestor 20mg  Continue Metoprolol Succinate 25mg  Plavix on hold with possible surgery As above, would favor Plavix/Eliquis only at discharge. No triple therapy. Would also favor ongoing Eliquis with LV apex dyskinetic and aneurysmal (chronic).  Lab Results  Component Value Date   CHOL 127 01/15/2023   HDL 28 (L) 01/15/2023   LDLCALC 63 01/15/2023   TRIG 179 (H) 01/15/2023   CHOLHDL 4.5 01/15/2023   Hypertension  Very fluctuant BP this admission. Currently on Amlodipine 5mg  and Metoprolol Succinate 25mg .   PAD Carotid artery disease  Chart history is unclear as to  patient's anti-platelet regimen over recent months. Notes indicate triple therapy x1 month following last carotid intervention but subsequent notes list ASA, Plavix, and Eliquis ongoing. As above, from safety standpoint, would favor Plavix and Eliquis. Otherwise management per vascular surgery.    Per primary team: COPD Hyponatremia DM type II Anion gap metabolic acidosis  Risk Assessment/Risk Scores:          CHA2DS2-VASc Score = 6   This indicates a 9.7% annual risk of stroke. The patient's score is based upon: CHF History: 0 HTN History: 1 Diabetes History: 1 Stroke History: 2 Vascular Disease History: 1 Age Score: 1 Gender Score: 0         For questions or updates, please contact [facility] Please consult [email or web address] for contact info under    Signed, Iokepa Liscio, PA-C  01/20/2023 4:34 PM  I have seen and examined the patient along with Perlie Gold, PA-C .  I have reviewed the chart, notes and new data.  I agree with PA/NP's note.  Key new complaints: Denies chest pain or shortness of breath at rest.  Was very weak and could barely walk from 1 room of his house to the other, but this was not due to shortness of breath.  Reports a 40 pound weight loss in less than a year, but tells me that he does not have difficulty with access to food and that he has a good appetite.  Complains of pain and sensitivity to touch over his right foot all the way from the ankle to the knee.  Symptoms are present at rest.  He denies symptoms of intestinal angina or food aversion. Key examination changes: Cachectic, very pale, not jaundiced.  Marland Kitchen  Appears extremely weak.  Regular rate and rhythm and normal cardiac exam, absent pulses in the left foot, but decent capillary refill, very thready pulses in the right dorsalis pedis, absent PT.  Bilateral femoral bruits.  Bilateral carotid bruits. Key new findings / data: Moderate normocytic normochromic anemia, moderate hyponatremia,  moderate hypoalbuminemia.  LDL less than 70, but substantially higher than it was a year ago.  HDL remains low at 28.  Severe aortic atherosclerosis on noncontrast imaging studies.  PLAN: Although he has extensive vascular disease, he does not have evidence of decompensated coronary insufficiency or heart failure. Cannot exclude significant mesenteric ischemia as a cause for his marked weight loss (he has lost almost 30% of his body weight in less than a year).  There have been no contrast angiographic images of his visceral arteries. Nevertheless he is at high risk for complications with abdominal surgery due to his age, poor nutrition, poor functional status and numerous comorbid conditions.  The risk of performing cholecystectomy at this time is high, but not prohibitive, should it be considered  urgent. I think before contemplating cholecystectomy will be important for him to improve his nutritional status and become stronger. The risk of bleeding with "triple therapy (aspirin plus clopidogrel plus Eliquis) is high.  He should continue Eliquis and probably should remain on only 1 antiplatelet agent for at least another 3 months, but I would recommend discontinuing the aspirin.  Thurmon Fair, MD, [facility] [phone] 01/20/2023, 4:53 PM

## 2023-01-20 NOTE — Progress Notes (Signed)
[facility] Gastroenterology Progress Note  Reginald Ayers 72 y.o. 1951-01-28  CC: Abnormal MRI concerning for choledocholithiasis   Subjective: Patient seen and examined at bedside.  Underwent ERCP yesterday with sphincterotomy and sludge removal.  No stones were identified.  Patient denies any GI symptoms today.  ROS : Afebrile, negative for chest pain   Objective: Vital signs in last 24 hours: Vitals:   01/20/23 0400 01/20/23 0802  BP: (!) 168/76   Pulse: 96   Resp: 16   Temp:  98.3 F (36.8 C)  SpO2: 92%     Physical Exam: Elderly patient, not in acute distress.  Abdominal exam benign.  Hard of hearing but otherwise alert and oriented x 3.  Lab Results: Recent Labs    01/19/23 0200 01/20/23 0401  NA 123* 125*  K 3.8 5.2*  CL 91* 88*  CO2 23 24  GLUCOSE 229* 217*  BUN 18 22  CREATININE 0.99 1.07  CALCIUM 7.6* 9.3   Recent Labs    01/18/23 0345  AST 29  ALT 19  ALKPHOS 47  BILITOT 0.2*  PROT 5.9*  ALBUMIN 2.9*   Recent Labs    01/19/23 0200 01/20/23 0401  WBC 10.3 5.6  HGB 8.3* 8.3*  HCT 25.2* 25.5*  MCV 89.4 87.6  PLT 261 262   No results for input(s): "LABPROT", "INR" in the last 72 hours.    Assessment/Plan: -Abnormal MRI MRCP concerning for severe intra and extrahepatic biliary dilation up to 1.8 cm with questionable stone in the cystic duct as well as near ampulla.  S/p ERCP with sphincterotomy and sludge removal yesterday. -Paroxysmal atrial fibrillation.  Eliquis on hold.  Was on heparin which was held this morning at 7 AM. -History of coronary artery disease, CVA and peripheral arterial disease.  Plavix on hold.  Recommendations ----------------------- -No further inpatient GI workup planned.  May benefit from cholecystectomy for cystic duct stone. -Okay to resume anticoagulation/antiplatelet from GI standpoint if no plan for cholecystectomy. -GI will sign off.  Call us back if needed.   Kathi Der MD, FACP 01/20/2023, 10:55  AM  Contact #  [phone]

## 2023-01-20 NOTE — Progress Notes (Signed)
Mobility Specialist Progress Note   01/20/23 1718  Mobility  Activity Transferred from bed to chair  Level of Assistance Minimal assist, patient does 75% or more  Assistive Device Front wheel walker  Distance Ambulated (ft) 4 ft  Activity Response Tolerated well  Mobility Referral Yes  $Mobility charge 1 Mobility  Mobility Specialist Start Time (ACUTE ONLY) 1718  Mobility Specialist Stop Time (ACUTE ONLY) 1730  Mobility Specialist Time Calculation (min) (ACUTE ONLY) 12 min   Pt agreeable to eat dinner sitting up in chair. Able to get EOB w/ minG but minA to stand and pivot to chair d/t generalized weakness. No faults on transfer, left w/ call bell in reach and chair alarm on.  Frederico Hamman Mobility Specialist Please contact via SecureChat or  Rehab office at [phone]

## 2023-01-20 NOTE — Anesthesia Postprocedure Evaluation (Signed)
Anesthesia Post Note  Patient: Reginald Ayers  Procedure(s) Performed: ENDOSCOPIC RETROGRADE CHOLANGIOPANCREATOGRAPHY (ERCP) SPHINCTEROTOMY REMOVAL OF STONES     Patient location during evaluation: PACU Anesthesia Type: General Level of consciousness: awake and alert Pain management: pain level controlled Vital Signs Assessment: post-procedure vital signs reviewed and stable Respiratory status: spontaneous breathing, nonlabored ventilation, respiratory function stable and patient connected to nasal cannula oxygen Cardiovascular status: blood pressure returned to baseline and stable Postop Assessment: no apparent nausea or vomiting Anesthetic complications: no   No notable events documented.  Last Vitals:  Vitals:   01/20/23 0200 01/20/23 0400  BP:  (!) 168/76  Pulse: 100 96  Resp: (!) 21 16  Temp:    SpO2: 93% 92%    Last Pain:  Vitals:   01/19/23 1953  TempSrc:   PainSc: 0-No pain                 Ardit Danh

## 2023-01-21 DIAGNOSIS — I10 Essential (primary) hypertension: Secondary | ICD-10-CM | POA: Diagnosis not present

## 2023-01-21 DIAGNOSIS — J449 Chronic obstructive pulmonary disease, unspecified: Secondary | ICD-10-CM | POA: Diagnosis not present

## 2023-01-21 DIAGNOSIS — I739 Peripheral vascular disease, unspecified: Secondary | ICD-10-CM | POA: Diagnosis not present

## 2023-01-21 DIAGNOSIS — E8729 Other acidosis: Secondary | ICD-10-CM | POA: Diagnosis not present

## 2023-01-21 LAB — BASIC METABOLIC PANEL WITH GFR
Anion gap: 11 (ref 5–15)
BUN: 24 mg/dL — ABNORMAL HIGH (ref 8–23)
CO2: 28 mmol/L (ref 22–32)
Calcium: 9.1 mg/dL (ref 8.9–10.3)
Chloride: 85 mmol/L — ABNORMAL LOW (ref 98–111)
Creatinine, Ser: 1.26 mg/dL — ABNORMAL HIGH (ref 0.61–1.24)
GFR, Estimated: 60 mL/min — ABNORMAL LOW
Glucose, Bld: 131 mg/dL — ABNORMAL HIGH (ref 70–99)
Potassium: 4.6 mmol/L (ref 3.5–5.1)
Sodium: 124 mmol/L — ABNORMAL LOW (ref 135–145)

## 2023-01-21 LAB — GLUCOSE, CAPILLARY
Glucose-Capillary: 143 mg/dL — ABNORMAL HIGH (ref 70–99)
Glucose-Capillary: 162 mg/dL — ABNORMAL HIGH (ref 70–99)
Glucose-Capillary: 131 mg/dL — ABNORMAL HIGH (ref 70–99)
Glucose-Capillary: 309 mg/dL — ABNORMAL HIGH (ref 70–99)
Glucose-Capillary: 267 mg/dL — ABNORMAL HIGH (ref 70–99)

## 2023-01-21 LAB — CBC
HCT: 27.5 % — ABNORMAL LOW (ref 39.0–52.0)
Hemoglobin: 9.1 g/dL — ABNORMAL LOW (ref 13.0–17.0)
MCH: 29.1 pg (ref 26.0–34.0)
MCHC: 33.1 g/dL (ref 30.0–36.0)
MCV: 87.9 fL (ref 80.0–100.0)
Platelets: 300 10*3/uL (ref 150–400)
RBC: 3.13 MIL/uL — ABNORMAL LOW (ref 4.22–5.81)
RDW: 14.1 % (ref 11.5–15.5)
WBC: 10.8 10*3/uL — ABNORMAL HIGH (ref 4.0–10.5)
nRBC: 0 % (ref 0.0–0.2)

## 2023-01-21 LAB — HEPARIN LEVEL (UNFRACTIONATED): Heparin Unfractionated: 0.43 [IU]/mL (ref 0.30–0.70)

## 2023-01-21 MED ORDER — GABAPENTIN 100 MG PO CAPS
200.0000 mg | ORAL_CAPSULE | Freq: Once | ORAL | Status: AC
  Administered 2023-01-21: 200 mg via ORAL
  Filled 2023-01-21: qty 2

## 2023-01-21 MED ORDER — GABAPENTIN 100 MG PO CAPS: 200.0000 mg | ORAL_CAPSULE | Freq: Three times a day (TID) | ORAL | Status: DC

## 2023-01-21 MED ORDER — SODIUM CHLORIDE 0.9 % IV SOLN: INTRAVENOUS | Status: AC

## 2023-01-21 MED ORDER — APIXABAN 5 MG PO TABS
5.0000 mg | ORAL_TABLET | Freq: Two times a day (BID) | ORAL | Status: DC
  Administered 2023-01-21 – 2023-01-22 (×4): 5 mg via ORAL
  Filled 2023-01-21 (×5): qty 1

## 2023-01-21 MED ORDER — ENSURE ENLIVE PO LIQD
237.0000 mL | Freq: Three times a day (TID) | ORAL | Status: DC
  Administered 2023-01-21 – 2023-01-29 (×17): 237 mL via ORAL

## 2023-01-21 MED ORDER — GABAPENTIN 300 MG PO CAPS
300.0000 mg | ORAL_CAPSULE | Freq: Three times a day (TID) | ORAL | Status: DC
  Administered 2023-01-21 – 2023-01-23 (×6): 300 mg via ORAL
  Filled 2023-01-21 (×6): qty 1

## 2023-01-21 NOTE — Progress Notes (Signed)
2 Days Post-Op   Subjective/Chief Complaint: Denies abdominal pain.  States he has never had any, the reason he came in was for his lower extremity pain/claudication   Objective: Vital signs in last 24 hours: Temp:  [97.8 F (36.6 C)-98.2 F (36.8 C)] 97.8 F (36.6 C) (05/30 0400) Pulse Rate:  [86-107] 95 (05/30 0800) Resp:  [15-20] 15 (05/30 0800) BP: (125-167)/(55-85) 152/79 (05/30 0800) SpO2:  [91 %-97 %] 93 % (05/30 0800) Last BM Date : 01/14/23  Intake/Output from previous day: 05/29 0701 - 05/30 0700 In: 1200 [P.O.:1200] Out: 4800 [Urine:4800] Intake/Output this shift: No intake/output data recorded.  Very frail elderly man, appears older than stated age Unlabored respirations Abdomen is soft and nontender  Lab Results:  Recent Labs    01/20/23 0401 01/21/23 0426  WBC 5.6 10.8*  HGB 8.3* 9.1*  HCT 25.5* 27.5*  PLT 262 300   BMET Recent Labs    01/20/23 0401 01/21/23 0426  NA 125* 124*  K 5.2* 4.6  CL 88* 85*  CO2 24 28  GLUCOSE 217* 131*  BUN 22 24*  CREATININE 1.07 1.26*  CALCIUM 9.3 9.1   PT/INR No results for input(s): "LABPROT", "INR" in the last 72 hours. ABG No results for input(s): "PHART", "HCO3" in the last 72 hours.  Invalid input(s): "PCO2", "PO2"  Studies/Results: DG ERCP  Result Date: 01/19/2023 CLINICAL DATA:  Elective surgery Cholelithiasis EXAM: ERCP TECHNIQUE: Multiple spot images obtained with the fluoroscopic device and submitted for interpretation post-procedure. FLUOROSCOPY: Radiation Exposure Index (as provided by the fluoroscopic device): 8.8 mGy Kerma COMPARISON:  CT abdomen pelvis 01/14/2023 FINDINGS: Seven intraoperative fluoroscopic images were submitted for interpretation. The submitted images demonstrate moderately dilated intra and extrahepatic bile ducts. Three small filling defects seen in the distal common bile duct on the final image may be artifact, however small choledocholiths are difficult to exclude.  IMPRESSION: Intraoperative fluoroscopic images of ERCP demonstrate moderately dilated intra and extrahepatic bile ducts. These images were submitted for radiologic interpretation only. Please see the procedural report for the amount of contrast and the fluoroscopy time utilized. Electronically Signed   By: Acquanetta Belling M.D.   On: 01/19/2023 14:07    Anti-infectives: Anti-infectives (From admission, onward)    Start     Dose/Rate Route Frequency Ordered Stop   01/19/23 1130  ciprofloxacin (CIPRO) IVPB 400 mg        400 mg 200 mL/hr over 60 Minutes Intravenous  Once 01/19/23 1037 01/19/23 1230   01/14/23 1745  cefTRIAXone (ROCEPHIN) 1 g in sodium chloride 0.9 % 100 mL IVPB        1 g 200 mL/hr over 30 Minutes Intravenous  Once 01/14/23 1741 01/14/23 1922   01/14/23 1745  azithromycin (ZITHROMAX) 500 mg in sodium chloride 0.9 % 250 mL IVPB        500 mg 250 mL/hr over 60 Minutes Intravenous  Once 01/14/23 1741 01/14/23 2315       Assessment/Plan: s/p Procedure(s): ENDOSCOPIC RETROGRADE CHOLANGIOPANCREATOGRAPHY (ERCP) (N/A) SPHINCTEROTOMY REMOVAL OF STONES  Cholelithiasis, Choledocholithiasis  - S/p ERCP with sphincterotomy on 5/28 by GI. Sludge was found and swept from the duct - No evidence of Cholecystitis on imaging. WBC wnl. He is NT on exam.  -Appreciate cardiology evaluation.  Agree his risk of complications is high and at this point given cholecystectomy would be a prophylactic procedure given incidental finding of choledocholithiasis, I do not recommend proceeding with this.  I discussed this with the patient.  He seems  to understand. -General surgery team will sign off at this time.  Please call if there are questions or concerns at any time.   FEN - per primary- I went ahead and put him back on heart healthy diet VTE - SCDs, okay for heparin gtt ID - Cipro peri-op. None currently.    Prior L CVA COPD - new O2 need, on 2L PAF on Eliquis (last dose 5/24 am) CAD s/p CABG   PAD s/p stent B/l carotid artery disease (s/p RICA stent, total occlusion L ICA on [place] Jan 2024) on ASA/Plavix (last dose Plavix 5/24 am) Hx HLD Hx HTN Hx DM Hx CKD3a Hx tobacco use   I reviewed nursing notes, ED provider notes, Consultant (GI) notes, hospitalist notes, last 24 h vitals and pain scores, last 48 h intake and output, last 24 h labs and trends, and last 24 h imaging results.     LOS: 6 days    Berna Bue 01/21/2023 Mdm-high

## 2023-01-21 NOTE — Consult Note (Signed)
WOC Nurse Consult Note: Reason for Consult:right lateral malleolus full thickness wound. Also noted are vasculitic changes to right foot, purple lesions to anterior foot, right great toe Wound type: Perfusion Pressure Injury POA: N/A Measurement:0.5cm round with depth obscured by yellow slough vs thin scabbing Wound bed:As noted above Drainage (amount, consistency, odor) None Periwound:As described above plus dry and intact Dressing procedure/placement/frequency: I will provide Nursing with guidance for turning and repositioning with time in the supine position limited, heels are to be floated using Prevalon boots and a sacral foam placed for PI prophylaxis. Topical care to the feet/foot will be to wash daily and paint the lesion with a povidone-iodine swabstick and allow to air dry for its astringent and antimicrobial properties. No dressing.  If desired, consider consultation with Vascular for evaluation of right foot either in house or in their office post discharge.  Discussed with Bedside Rn via Secure Chat.  WOC nursing team will not follow, but will remain available to this patient, the nursing and medical teams.  Please re-consult if needed.  Thank you for inviting [place] to participate in this patient's Plan of Care.  Ladona Mow, MSN, RN, CNS, GNP, Leda Min, [employer], [employer] phone:  [phone]

## 2023-01-21 NOTE — Progress Notes (Signed)
PROGRESS NOTE        PATIENT DETAILS Name: Reginald Ayers Age: 72 y.o. Sex: male Date of Birth: 07/20/1951 Admit Date: 01/14/2023 Admitting Physician Jonah Blue, MD AOZ:[id number], Selena Batten, NP  Brief Summary: Patient is a 72 y.o.  male COPD, CAD s/p CABG, PAD s/p stent, HLD, HTN-who presented with weakness.  Per H&P-patient was found at home with bedbugs, and covered in fecal matter.   Significant events: 5/23>> admit to [facility]  Significant studies: 5/23>> MRI brain: No acute abnormality-chronically occluded left ICA 5/23>> CT abdomen/pelvis: Mild groundglass density in the right lower lobe, distended gallbladder  without calcified stone, dilated CBD measuring up to 19 mm with suspicion for intrahepatic dilatation. 5/24>> MRCP:Severe intrahepatic/extrahepatic biliary ductal dilatation-tiny gallstones/sludge within the cystic duct near the ampulla.  Significant microbiology data: None  Procedures: None  Consults: None  Subjective: No nausea/vomiting-tolerating diet.  Extensive discussion-does not want to go to SNF-would rather go home with home health.  Objective: Vitals: Blood pressure (!) 93/59, pulse 95, temperature 97.6 F (36.4 C), temperature source Oral, resp. rate 15, height 5\' 1"  (1.549 m), weight 43.5 kg, SpO2 93 %.   Exam: Gen Exam:Alert awake-not in any distress HEENT:atraumatic, normocephalic Chest: B/L clear to auscultation anteriorly CVS:S1S2 regular Abdomen:soft non tender, non distended Extremities:no edema Neurology: Non focal Skin: no rash  Pertinent Labs/Radiology:    Latest Ref Rng & Units 01/21/2023    4:26 AM 01/20/2023    4:01 AM 01/19/2023    2:00 AM  CBC  WBC 4.0 - 10.5 K/uL 10.8  5.6  10.3   Hemoglobin 13.0 - 17.0 g/dL 9.1  8.3  8.3   Hematocrit 39.0 - 52.0 % 27.5  25.5  25.2   Platelets 150 - 400 K/uL 300  262  261     Lab Results  Component Value Date   NA 124 (L) 01/21/2023   K 4.6 01/21/2023   CL 85 (L)  01/21/2023   CO2 28 01/21/2023      Assessment/Plan: Weakness Poor historian-unclear etiology-given MRCP findings-concerned that his clinical presentation on admission could have been due to a passed CBD stone/symptomatic choledocholithiasis..    Anion gap metabolic acidosis Likely starvation ketoacidosis-due to poor intake/possible vomiting-in the setting of possible passed CBD stone. Improving with supportive care  Dilated common bile duct/intrahepatic dilatation likely secondary to choledocholithiasis Although he is a poor historian-suspect his presenting complaints are likely due to symptomatic choledocholithiasis-he may have passed a CBD stone.  ERCP with sphincterotomy on 5/28-after discussion with general surgery/cardiology-felt to be high risk for cholecystectomy-plan is to see if he can be optimized with PT/OT-and then follow-up with general surgery in the outpatient setting.    HTN BP better controlled with amlodipine/metoprolol.  CAD-s/p CABG No anginal symptoms See below regarding antiplatelets. Continue statin/beta-blocker  History of CVA No obvious deficits on exam-MRI brain negative Continue statin See below regarding antiplatelets.  PAF Sinus rhythm Eliquis held on 5/25-on IV heparin currently until all surgical procedures are completed.  PAD Has had numerous procedures in the past-follow with Dr. Myra Gianotti Prior to this hospitalization he was on aspirin/Plavix/Eliquis Discussed with Dr. Myra Gianotti on 5/24-okay to stop Plavix (as he is on Eliquis) Remains on aspirin/statin  DM-2 CBGs relatively stable Increase Semglee to 14 units, continue to hold metformin-May need to stop given metabolic acidosis on presentation.     Recent  Labs    01/21/23 0229 01/21/23 0737 01/21/23 1137  GLUCAP 143* 162* 131*    Hyponatremia Suspect this is the excessive free water intake-however no improvement with Lasix-creatinine has increased Fluid restriction Will try  hydration for a few hours with isotonic saline Repeat electrolytes tomorrow. Remains asymptomatic  COPD Stable Bronchodilators  Debility/deconditioning Secondary to acute illness-frailty at baseline Adamantly refuses SNF-wants to go home with home health.  Understands risk of rehospitalization/decompensation.  Claims lives with 2 roommates-and has 7 dogs that he misses.  BMI: Estimated body mass index is 18.12 kg/m as calculated from the following:   Height as of this encounter: 5\' 1"  (1.549 m).   Weight as of this encounter: 43.5 kg.   Code status:   Code Status: DNR   DVT Prophylaxis: IV heparin gtt apixaban (ELIQUIS) tablet 5 mg   Family Communication: Niece-[name] [phone] VM on 5/25, 5/26,5/27   Disposition Plan: Status is: Observation The patient will require care spanning > 2 midnights and should be moved to inpatient because: Severity of illness.   Planned Discharge Destination:Home health   Diet: Diet Order             Diet Heart Room service appropriate? No; Fluid consistency: Thin  Diet effective now                     Antimicrobial agents: Anti-infectives (From admission, onward)    Start     Dose/Rate Route Frequency Ordered Stop   01/19/23 1130  ciprofloxacin (CIPRO) IVPB 400 mg        400 mg 200 mL/hr over 60 Minutes Intravenous  Once 01/19/23 1037 01/19/23 1230   01/14/23 1745  cefTRIAXone (ROCEPHIN) 1 g in sodium chloride 0.9 % 100 mL IVPB        1 g 200 mL/hr over 30 Minutes Intravenous  Once 01/14/23 1741 01/14/23 1922   01/14/23 1745  azithromycin (ZITHROMAX) 500 mg in sodium chloride 0.9 % 250 mL IVPB        500 mg 250 mL/hr over 60 Minutes Intravenous  Once 01/14/23 1741 01/14/23 2315        MEDICATIONS: Scheduled Meds:   stroke: early stages of recovery book   Does not apply Once   amLODipine  5 mg Oral Daily   apixaban  5 mg Oral BID   aspirin EC  81 mg Oral Daily   feeding supplement  237 mL Oral BID BM    gabapentin  200 mg Oral BID   insulin aspart  0-15 Units Subcutaneous TID WC   insulin aspart  0-5 Units Subcutaneous QHS   insulin glargine-yfgn  10 Units Subcutaneous Daily   metoprolol succinate  25 mg Oral Daily   multivitamin with minerals  1 tablet Oral Daily   pantoprazole  40 mg Oral Daily   rosuvastatin  20 mg Oral Daily   umeclidinium bromide  1 puff Inhalation QHS   Continuous Infusions:  sodium chloride 60 mL/hr at 01/21/23 0928   PRN Meds:.acetaminophen **OR** acetaminophen (TYLENOL) oral liquid 160 mg/5 mL **OR** acetaminophen, albuterol, alum & mag hydroxide-simeth, hydrALAZINE, ondansetron (ZOFRAN) IV   I have personally reviewed following labs and imaging studies  LABORATORY DATA: CBC: Recent Labs  Lab 01/17/23 0346 01/18/23 0345 01/19/23 0200 01/20/23 0401 01/21/23 0426  WBC 8.4 9.5 10.3 5.6 10.8*  HGB 9.3* 9.5* 8.3* 8.3* 9.1*  HCT 28.7* 30.3* 25.2* 25.5* 27.5*  MCV 88.0 90.7 89.4 87.6 87.9  PLT 285 293 261 262  300     Basic Metabolic Panel: Recent Labs  Lab 01/16/23 0708 01/18/23 0345 01/19/23 0200 01/20/23 0401 01/21/23 0426  NA 131* 128* 123* 125* 124*  K 3.5 3.9 3.8 5.2* 4.6  CL 101 99 91* 88* 85*  CO2 19* 20* 23 24 28   GLUCOSE 302* 173* 229* 217* 131*  BUN 19 12 18 22  24*  CREATININE 0.98 0.92 0.99 1.07 1.26*  CALCIUM 8.9 8.3* 7.6* 9.3 9.1     GFR: Estimated Creatinine Clearance: 31.6 mL/min (A) (by C-G formula based on SCr of 1.26 mg/dL (H)).  Liver Function Tests: Recent Labs  Lab 01/15/23 0717 01/16/23 0708 01/18/23 0345  AST 29 32 29  ALT 19 22 19   ALKPHOS 55 73 47  BILITOT 0.5 0.6 0.2*  PROT 6.2* 6.2* 5.9*  ALBUMIN 3.3* 3.3* 2.9*    Recent Labs  Lab 01/14/23 1836  LIPASE 22    No results for input(s): "AMMONIA" in the last 168 hours.  Coagulation Profile: No results for input(s): "INR", "PROTIME" in the last 168 hours.   Cardiac Enzymes: No results for input(s): "CKTOTAL", "CKMB", "CKMBINDEX", "TROPONINI"  in the last 168 hours.  BNP (last 3 results) No results for input(s): "PROBNP" in the last 8760 hours.  Lipid Profile: No results for input(s): "CHOL", "HDL", "LDLCALC", "TRIG", "CHOLHDL", "LDLDIRECT" in the last 72 hours.   Thyroid Function Tests: No results for input(s): "TSH", "T4TOTAL", "FREET4", "T3FREE", "THYROIDAB" in the last 72 hours.  Anemia Panel: No results for input(s): "VITAMINB12", "FOLATE", "FERRITIN", "TIBC", "IRON", "RETICCTPCT" in the last 72 hours.  Urine analysis:    Component Value Date/Time   COLORURINE YELLOW 01/14/2023 1059   APPEARANCEUR CLEAR 01/14/2023 1059   LABSPEC 1.017 01/14/2023 1059   PHURINE 5.0 01/14/2023 1059   GLUCOSEU NEGATIVE 01/14/2023 1059   HGBUR SMALL (A) 01/14/2023 1059   BILIRUBINUR NEGATIVE 01/14/2023 1059   KETONESUR 5 (A) 01/14/2023 1059   PROTEINUR 100 (A) 01/14/2023 1059   UROBILINOGEN 0.2 07/07/2015 1842   NITRITE NEGATIVE 01/14/2023 1059   LEUKOCYTESUR NEGATIVE 01/14/2023 1059    Sepsis Labs: Lactic Acid, Venous    Component Value Date/Time   LATICACIDVEN 1.6 01/10/2022 1854    MICROBIOLOGY: No results found for this or any previous visit (from the past 240 hour(s)).  RADIOLOGY STUDIES/RESULTS: DG ERCP  Result Date: 01/19/2023 CLINICAL DATA:  Elective surgery Cholelithiasis EXAM: ERCP TECHNIQUE: Multiple spot images obtained with the fluoroscopic device and submitted for interpretation post-procedure. FLUOROSCOPY: Radiation Exposure Index (as provided by the fluoroscopic device): 8.8 mGy Kerma COMPARISON:  CT abdomen pelvis 01/14/2023 FINDINGS: Seven intraoperative fluoroscopic images were submitted for interpretation. The submitted images demonstrate moderately dilated intra and extrahepatic bile ducts. Three small filling defects seen in the distal common bile duct on the final image may be artifact, however small choledocholiths are difficult to exclude. IMPRESSION: Intraoperative fluoroscopic images of ERCP  demonstrate moderately dilated intra and extrahepatic bile ducts. These images were submitted for radiologic interpretation only. Please see the procedural report for the amount of contrast and the fluoroscopy time utilized. Electronically Signed   By: Acquanetta Belling M.D.   On: 01/19/2023 14:07     LOS: 6 days   [name] Massed, MD  [facility]    To contact the attending provider between 7A-7P or the covering provider during after hours 7P-7A, please log into the web site [email or web address] and access using universal [place] password for that web site. If you do not have the password, please call the  hospital operator.  01/21/2023, 12:49 PM

## 2023-01-21 NOTE — Progress Notes (Signed)
Occupational Therapy Treatment Patient Details Name: Reginald Ayers MRN: [id number] DOB: 23-Jul-1951 Today's Date: 01/21/2023   History of present illness 72 y/o M admitted to [facility] on 5/23 for weakness and pain in his RLE, pt found in fecal matter and bedbugs. MRI of brain with no acute abnormality, chronically occluded left ICA. CT of abdomen/pelvis with mild groundglass denisty in the RLL, distended gallbladder without calcified stone, dilated CBD with suspicion for intrahepatic dilatation. PMHx: COPD, CAD s/p CABG, DM, HTN, HLD, PAD s/p stent, tobacco dependence.   OT comments  Pt supine in bed with HOB elevated upon OT arrival. Pt agreeable to participation in skilled OT session. OT educated pt in techniques for increased safety and independence with ADLs, bed mobility in preparation for ADLs sitting EOB, and functional transfers. Pt currently demonstrates ability to complete UB ADLs with Mod I to Min assist, LB ADLs with Min to Mod assist, bed mobility in preparation for ADLs with Min guard assist, and functional transfers with a RW with Min assist. Pt demonstrated need for fewer sequencing cues this session as compared to skilled OT session on 5/24. Pt is making progress toward goals. Pt will benefit from continued acute skilled OT services to address deficits and increase safety and independence ADLs and functional transfers/mobility. Discharge plan remains appropriate.    Recommendations for follow up therapy are one component of a multi-disciplinary discharge planning process, led by the attending physician.  Recommendations may be updated based on patient status, additional functional criteria and insurance authorization.    Assistance Recommended at Discharge Frequent or constant Supervision/Assistance  Patient can return home with the following  A lot of help with bathing/dressing/bathroom;Assistance with cooking/housework;Direct supervision/assist for medications management;Direct  supervision/assist for financial management;Assist for transportation;Help with stairs or ramp for entrance;A little help with walking and/or transfers   Equipment Recommendations  Other (comment) (defer to next level of care)    Recommendations for Other Services      Precautions / Restrictions Precautions Precautions: Fall Precaution Comments: bed bugs Restrictions Weight Bearing Restrictions: No       Mobility Bed Mobility Overal bed mobility: Needs Assistance Bed Mobility: Supine to Sit     Supine to sit: Min guard (with cues for technique and to scoot to EOB)          Transfers Overall transfer level: Needs assistance Equipment used: Rolling walker (2 wheels) Transfers: Sit to/from Stand, Bed to chair/wheelchair/BSC Sit to Stand: Min assist     Step pivot transfers: Min assist           Balance Overall balance assessment: Needs assistance Sitting-balance support: Feet supported, No upper extremity supported, Single extremity supported Sitting balance-Leahy Scale: Fair   Postural control: Posterior lean (when fatigued) Standing balance support: Bilateral upper extremity supported, Reliant on assistive device for balance, During functional activity Standing balance-Leahy Scale: Poor                             ADL either performed or assessed with clinical judgement   ADL Overall ADL's : Needs assistance/impaired     Grooming: Modified independent;Min guard;Sitting Grooming Details (indicate cue type and reason): Pt demonstrated ability to complete grooming tasks Mod I but needed Supervision to Min guard assist to maintain balance in sitting throughout grooming tasks. Pt noted to have posterior lean toward end of groooming tasks secondary to fatigue with pt demonstrating ability to self correct with verbal cues. Upper Body Bathing:  Sitting;Minimal assistance Upper Body Bathing Details (indicate cue type and reason): Pt required Supervision to  Min guard assist to maintain balance in sitting throughout grooming tasks. Pt noted to have posterior lean toward end of groooming tasks secondary to fatigue with pt demonstrating ability to self correct with verbal cues. Lower Body Bathing: Minimal assistance;Moderate assistance;Cueing for safety;Cueing for compensatory techniques;Sit to/from stand;Sitting/lateral leans   Upper Body Dressing : Min guard (Min guard while sitting with back supported.)   Lower Body Dressing: Moderate assistance;Cueing for safety;Sitting/lateral leans;Sit to/from stand   Toilet Transfer: Minimal assistance;Stand-pivot;Rolling walker (2 wheels);BSC/3in1;Cueing for sequencing   Toileting- Clothing Manipulation and Hygiene: Minimal assistance;Moderate assistance;Cueing for compensatory techniques;Sit to/from stand (in simulated task)         General ADL Comments: Pt required fewer cues this session as compared to skilled OT session on 5/24.    Extremity/Trunk Assessment Upper Extremity Assessment Upper Extremity Assessment: Generalized weakness   Lower Extremity Assessment Lower Extremity Assessment: Defer to PT evaluation        Vision       Perception     Praxis      Cognition Arousal/Alertness: Awake/alert Behavior During Therapy: WFL for tasks assessed/performed Overall Cognitive Status: No family/caregiver present to determine baseline cognitive functioning                                          Exercises      Shoulder Instructions       General Comments VSS on RA throughout session    Pertinent Vitals/ Pain       Pain Assessment Pain Assessment: Faces Faces Pain Scale: Hurts little more Pain Location: RLE Pain Descriptors / Indicators: Sore, Burning, Aching Pain Intervention(s): Limited activity within patient's tolerance, Monitored during session, Repositioned  Home Living                                          Prior  Functioning/Environment              Frequency  Min 2X/week        Progress Toward Goals  OT Goals(current goals can now be found in the care plan section)  Progress towards OT goals: Progressing toward goals  Acute Rehab OT Goals Patient Stated Goal: To get stronger and have less pain in his R LE  Plan Discharge plan remains appropriate    Co-evaluation                 AM-PAC OT "6 Clicks" Daily Activity     Outcome Measure   Help from another person eating meals?: None Help from another person taking care of personal grooming?: None (in supported sitting) Help from another person toileting, which includes using toliet, bedpan, or urinal?: A Little Help from another person bathing (including washing, rinsing, drying)?: A Lot Help from another person to put on and taking off regular upper body clothing?: A Little Help from another person to put on and taking off regular lower body clothing?: A Lot 6 Click Score: 18    End of Session Equipment Utilized During Treatment: Gait belt;Rolling walker (2 wheels)  OT Visit Diagnosis: Unsteadiness on feet (R26.81);Other abnormalities of gait and mobility (R26.89);History of falling (Z91.81);Muscle weakness (generalized) (M62.81);Other (comment) (decreased activity tolerance; decreased balance during  functional tasks)   Activity Tolerance Patient tolerated treatment well   Patient Left in chair;with call bell/phone within reach;with chair alarm set;with nursing/sitter in room   Nurse Communication Mobility status        Time: 1024-1110 OT Time Calculation (min): 46 min  Charges: OT General Charges $OT Visit: 1 Visit OT Treatments $Self Care/Home Management : 38-52 mins  Thuan Tippett "Orson Eva., OTR/L, MA Acute Rehab [phone]   Lendon Colonel 01/21/2023, 4:23 PM

## 2023-01-21 NOTE — Progress Notes (Signed)
Nutrition Follow-up  DOCUMENTATION CODES:   Severe malnutrition in context of chronic illness, Underweight  INTERVENTION:  Continue Multivitamin w/ minerals daily Increase Ensure Enlive po TID, each supplement provides 350 kcal and 20 grams of protein. Recommend starting a bowel regimen due to no BM in 7 days  NUTRITION DIAGNOSIS:  Severe Malnutrition related to chronic illness as evidenced by severe muscle depletion, severe fat depletion. - Ongoing   GOAL:  Patient will meet greater than or equal to 90% of their needs - Ongoing   MONITOR:   PO intake, Supplement acceptance, Labs, I & O's  REASON FOR ASSESSMENT:   Consult Assessment of nutrition requirement/status  ASSESSMENT:   72 y.o. male presented to the ED with RLE pain, weakness, fecal matter and bedbugs. PMH includes CKD IIIa, GERD, COPD, PAD, HTN, and HLD. Pt admitted with metabolic acidosis and R side weakness.   Pt did not wake to RD knock or voice. Discussed pt in rounds with team. Discussed with RN, RN reports that he is drinking the Ensure's and she was about to take him one.   Surgery and GI team signing off, no surgical for interventions regarding cholelithiasis.    Meal Intake  5/27-5/29: 35-100% x 6 meals   Medications reviewed and include: NovoLog SSI, Semglee, MVI, Protonix Labs reviewed: Sodium 124, Potassium 4.6, BUN 24, Creatinine 1.26 CBG: 131-254 x 24 hours  Diet Order:   Diet Order             Diet Heart Room service appropriate? Yes; Fluid consistency: Thin  Diet effective now                  EDUCATION NEEDS:  Not appropriate for education at this time  Skin:  Skin Assessment: Reviewed RN Assessment  Last BM:  5/23  Height:  Ht Readings from Last 1 Encounters:  01/19/23 5\' 1"  (1.549 m)   Weight:  Wt Readings from Last 1 Encounters:  01/19/23 43.5 kg   Ideal Body Weight:  50.9 kg  BMI:  Body mass index is 18.12 kg/m.  Estimated Nutritional Needs:  Kcal:   1500-1700 Protein:  70-90 grams Fluid:  >/= 1.5 L   Kirby Crigler RD, LDN Clinical Dietitian See [facility] for contact information.

## 2023-01-21 NOTE — Discharge Instructions (Addendum)
Information on my medicine - ELIQUIS (apixaban)  This medication education was reviewed with me or my healthcare representative as part of my discharge preparation.  The pharmacist that spoke with me during my hospital stay was:    Why was Eliquis prescribed for you? Eliquis was prescribed for you to reduce the risk of a blood clot forming that can cause a stroke if you have a medical condition called atrial fibrillation (a type of irregular heartbeat).  What do You need to know about Eliquis ? Take your Eliquis TWICE DAILY - one tablet in the morning and one tablet in the evening with or without food. If you have difficulty swallowing the tablet whole please discuss with your pharmacist how to take the medication safely.  Take Eliquis exactly as prescribed by your doctor and DO NOT stop taking Eliquis without talking to the doctor who prescribed the medication.  Stopping may increase your risk of developing a stroke.  Refill your prescription before you run out.  After discharge, you should have regular check-up appointments with your healthcare provider that is prescribing your Eliquis.  In the future your dose may need to be changed if your kidney function or weight changes by a significant amount or as you get older.  What do you do if you miss a dose? If you miss a dose, take it as soon as you remember on the same day and resume taking twice daily.  Do not take more than one dose of ELIQUIS at the same time to make up a missed dose.  Important Safety Information A possible side effect of Eliquis is bleeding. You should call your healthcare provider right away if you experience any of the following: Bleeding from an injury or your nose that does not stop. Unusual colored urine (red or dark brown) or unusual colored stools (red or black). Unusual bruising for unknown reasons. A serious fall or if you hit your head (even if there is no bleeding).  Some medicines may interact with  Eliquis and might increase your risk of bleeding or clotting while on Eliquis. To help avoid this, consult your healthcare provider or pharmacist prior to using any new prescription or non-prescription medications, including herbals, vitamins, non-steroidal anti-inflammatory drugs (NSAIDs) and supplements.  This website has more information on Eliquis (apixaban): http://[email or web address].com/eliquis/home   Disposition.  Residential hospice Condition.  Guarded CODE STATUS.  DNR Activity.  With assistance as tolerated, full fall precautions. Diet.  Soft with feeding assistance and aspiration precautions. Goal of care.  Comfort.

## 2023-01-22 DIAGNOSIS — J449 Chronic obstructive pulmonary disease, unspecified: Secondary | ICD-10-CM | POA: Diagnosis not present

## 2023-01-22 DIAGNOSIS — I739 Peripheral vascular disease, unspecified: Secondary | ICD-10-CM | POA: Diagnosis not present

## 2023-01-22 DIAGNOSIS — I10 Essential (primary) hypertension: Secondary | ICD-10-CM | POA: Diagnosis not present

## 2023-01-22 DIAGNOSIS — E8729 Other acidosis: Secondary | ICD-10-CM | POA: Diagnosis not present

## 2023-01-22 LAB — BASIC METABOLIC PANEL
Anion gap: 12 (ref 5–15)
BUN: 34 mg/dL — ABNORMAL HIGH (ref 8–23)
CO2: 25 mmol/L (ref 22–32)
Calcium: 9 mg/dL (ref 8.9–10.3)
Chloride: 89 mmol/L — ABNORMAL LOW (ref 98–111)
Creatinine, Ser: 1.31 mg/dL — ABNORMAL HIGH (ref 0.61–1.24)
GFR, Estimated: 57 mL/min — ABNORMAL LOW (ref >60)
Glucose, Bld: 301 mg/dL — ABNORMAL HIGH (ref 70–99)
Potassium: 5.1 mmol/L (ref 3.5–5.1)
Sodium: 126 mmol/L — ABNORMAL LOW (ref 135–145)

## 2023-01-22 LAB — GLUCOSE, CAPILLARY
Glucose-Capillary: 269 mg/dL — ABNORMAL HIGH (ref 70–99)
Glucose-Capillary: 328 mg/dL — ABNORMAL HIGH (ref 70–99)
Glucose-Capillary: 308 mg/dL — ABNORMAL HIGH (ref 70–99)
Glucose-Capillary: 246 mg/dL — ABNORMAL HIGH (ref 70–99)

## 2023-01-22 MED ORDER — INSULIN ASPART 100 UNIT/ML IJ SOLN
4.0000 [IU] | Freq: Three times a day (TID) | INTRAMUSCULAR | Status: DC
  Administered 2023-01-22 – 2023-01-26 (×10): 4 [IU] via SUBCUTANEOUS

## 2023-01-22 MED ORDER — BISACODYL 10 MG RE SUPP
10.0000 mg | Freq: Once | RECTAL | Status: AC
  Administered 2023-01-22: 10 mg via RECTAL
  Filled 2023-01-22: qty 1

## 2023-01-22 MED ORDER — INSULIN GLARGINE-YFGN 100 UNIT/ML ~~LOC~~ SOLN
14.0000 [IU] | Freq: Every day | SUBCUTANEOUS | Status: DC
  Administered 2023-01-22 – 2023-01-29 (×8): 14 [IU] via SUBCUTANEOUS
  Filled 2023-01-22 (×9): qty 0.14

## 2023-01-22 MED ORDER — SODIUM CHLORIDE 0.9 % IV SOLN: INTRAVENOUS | Status: AC

## 2023-01-22 MED ORDER — POLYETHYLENE GLYCOL 3350 17 G PO PACK
17.0000 g | PACK | Freq: Every day | ORAL | Status: DC
  Administered 2023-01-22 – 2023-01-29 (×5): 17 g via ORAL
  Filled 2023-01-22 (×5): qty 1

## 2023-01-22 NOTE — Plan of Care (Signed)

## 2023-01-22 NOTE — Progress Notes (Signed)
Physical Therapy Treatment Patient Details Name: Reginald Ayers MRN: [id number] DOB: 05-31-1951 Today's Date: 01/22/2023   History of Present Illness 72 y/o M admitted to [facility] on 5/23 for weakness and pain in his RLE, pt found in fecal matter and bedbugs. MRI of brain with no acute abnormality, chronically occluded left ICA. CT of abdomen/pelvis with mild groundglass denisty in the RLL, distended gallbladder without calcified stone, dilated CBD with suspicion for intrahepatic dilatation. PMHx: COPD, CAD s/p CABG, DM, HTN, HLD, PAD s/p stent, tobacco dependence.    PT Comments    Pt required min guard assist bed mobility, min assist transfers, and min assist amb 5' with RW. Gait distance limited by R foot pain. Pt in recliner with feet elevated at end of session. Pt continues to decline follow up therapy services in an inpt setting, voicing his desire to return home with home health.    Recommendations for follow up therapy are one component of a multi-disciplinary discharge planning process, led by the attending physician.  Recommendations may be updated based on patient status, additional functional criteria and insurance authorization.  Follow Up Recommendations  Can patient physically be transported by private vehicle: Yes    Assistance Recommended at Discharge Frequent or constant Supervision/Assistance  Patient can return home with the following A lot of help with walking and/or transfers;Assistance with cooking/housework;Assist for transportation;Help with stairs or ramp for entrance;A lot of help with bathing/dressing/bathroom   Equipment Recommendations  BSC/3in1;Rolling walker (2 wheels)    Recommendations for Other Services       Precautions / Restrictions Precautions Precautions: Fall;Other (comment) Precaution Comments: bed bugs     Mobility  Bed Mobility Overal bed mobility: Needs Assistance Bed Mobility: Supine to Sit     Supine to sit: Min guard, HOB elevated      General bed mobility comments: +rail, increased time    Transfers Overall transfer level: Needs assistance Equipment used: Rolling walker (2 wheels) Transfers: Sit to/from Stand Sit to Stand: Min assist           General transfer comment: assist to power up and stabilize balance    Ambulation/Gait Ambulation/Gait assistance: Min assist Gait Distance (Feet): 5 Feet Assistive device: Rolling walker (2 wheels) Gait Pattern/deviations: Trunk flexed, Step-to pattern, Antalgic Gait velocity: decreased     General Gait Details: distance limited by R foot pain   Stairs             Wheelchair Mobility    Modified Rankin (Stroke Patients Only)       Balance Overall balance assessment: Needs assistance Sitting-balance support: Feet supported, No upper extremity supported Sitting balance-Leahy Scale: Fair     Standing balance support: Bilateral upper extremity supported, Reliant on assistive device for balance, During functional activity Standing balance-Leahy Scale: Poor                              Cognition Arousal/Alertness: Awake/alert Behavior During Therapy: WFL for tasks assessed/performed Overall Cognitive Status: No family/caregiver present to determine baseline cognitive functioning                                 General Comments: A&Ox4, follows 1 step commands. can be verbose/distractable. [facility]        Exercises      General Comments General comments (skin integrity, edema, etc.): VSS on 2L  Pertinent Vitals/Pain Pain Assessment Pain Assessment: 0-10 Pain Score: 9  Pain Location: RLE with WB Pain Descriptors / Indicators: Sore Pain Intervention(s): Monitored during session, Limited activity within patient's tolerance, Repositioned    Home Living                          Prior Function            PT Goals (current goals can now be found in the care plan section) Acute Rehab PT Goals Patient  Stated Goal: home Progress towards PT goals: Progressing toward goals    Frequency    Min 3X/week      PT Plan Current plan remains appropriate    Co-evaluation              AM-PAC PT "6 Clicks" Mobility   Outcome Measure  Help needed turning from your back to your side while in a flat bed without using bedrails?: A Little Help needed moving from lying on your back to sitting on the side of a flat bed without using bedrails?: A Little Help needed moving to and from a bed to a chair (including a wheelchair)?: A Little Help needed standing up from a chair using your arms (e.g., wheelchair or bedside chair)?: A Little Help needed to walk in hospital room?: A Little Help needed climbing 3-5 steps with a railing? : Total 6 Click Score: 16    End of Session Equipment Utilized During Treatment: Gait belt Activity Tolerance: Patient tolerated treatment well Patient left: in chair;with call bell/phone within reach;with chair alarm set Nurse Communication: Mobility status PT Visit Diagnosis: Unsteadiness on feet (R26.81);Muscle weakness (generalized) (M62.81);History of falling (Z91.81);Difficulty in walking, not elsewhere classified (R26.2)     Time: 1610-9604 PT Time Calculation (min) (ACUTE ONLY): 26 min  Charges:  $Gait Training: 23-37 mins                     Ferd Glassing., PT  Office # [phone]    Ilda Foil 01/22/2023, 11:07 AM

## 2023-01-22 NOTE — Progress Notes (Signed)
PROGRESS NOTE        PATIENT DETAILS Name: Reginald Ayers Age: 72 y.o. Sex: male Date of Birth: 11-27-1950 Admit Date: 01/14/2023 Admitting Physician Jonah Blue, MD [id number], Selena Batten, NP  Brief Summary: Patient is a 72 y.o.  male COPD, CAD s/p CABG, PAD s/p stent, HLD, HTN-who presented with weakness.  Per H&P-patient was found at home with bedbugs, and covered in fecal matter.   Significant events: 5/23>> admit to [facility]  Significant studies: 5/23>> MRI brain: No acute abnormality-chronically occluded left ICA 5/23>> CT abdomen/pelvis: Mild groundglass density in the right lower lobe, distended gallbladder  without calcified stone, dilated CBD measuring up to 19 mm with suspicion for intrahepatic dilatation. 5/24>> MRCP:Severe intrahepatic/extrahepatic biliary ductal dilatation-tiny gallstones/sludge within the cystic duct near the ampulla.  Significant microbiology data: None  Procedures: None  Consults: None  Subjective: No major issues overnight-sodium levels are slowly improving.  He reemphasizes that he wants to go home and will not go to SNF.  Objective: Vitals: Blood pressure 133/63, pulse 96, temperature 97.6 F (36.4 C), temperature source Oral, resp. rate 19, height 5\' 1"  (1.549 m), weight 43.5 kg, SpO2 95 %.   Exam: Gen Exam:Alert awake-not in any distress HEENT:atraumatic, normocephalic Chest: B/L clear to auscultation anteriorly CVS:S1S2 regular Abdomen:soft non tender, non distended Extremities:no edema Neurology: Non focal Skin: no rash  Pertinent Labs/Radiology:    Latest Ref Rng & Units 01/21/2023    4:26 AM 01/20/2023    4:01 AM 01/19/2023    2:00 AM  CBC  WBC 4.0 - 10.5 K/uL 10.8  5.6  10.3   Hemoglobin 13.0 - 17.0 g/dL 9.1  8.3  8.3   Hematocrit 39.0 - 52.0 % 27.5  25.5  25.2   Platelets 150 - 400 K/uL 300  262  261     Lab Results  Component Value Date   NA 126 (L) 01/22/2023   K 5.1 01/22/2023   CL 89 (L)  01/22/2023   CO2 25 01/22/2023      Assessment/Plan: Weakness/nausea/vomiting Dilated bile ducts secondary to choledocholithiasis Poor historian-unclear etiology-given MRCP findings-concerned that his clinical presentation on admission could have been due to a passed CBD stone/symptomatic choledocholithiasis.  He is s/p ERCP on 5/28-not felt to be a good candidate for cholecystectomy given high risk profile. Nausea/vomiting has resolved-he is tolerating regular diet.  Anion gap metabolic acidosis Likely starvation ketoacidosis-due to poor intake/possible vomiting-in the setting of possible passed CBD stone. Improving with supportive care  AKI Mild Check bladder scans Gently hydrate Avoid nephrotoxic agents  Hyponatremia Mild Asymptomatic Slowly improving with gentle hydration Repeat electrolytes tomorrow  HTN BP better controlled with amlodipine/metoprolol.  CAD-s/p CABG No anginal symptoms See below regarding antiplatelets. Continue statin/beta-blocker  History of CVA No obvious deficits on exam-MRI brain negative Continue statin See below regarding antiplatelets.  PAF Sinus rhythm Eliquis initially held-but since all surgical procedures are complete-has been resumed.  PAD Has had numerous procedures in the past-follow with Dr. Myra Gianotti Prior to this hospitalization he was on aspirin/Plavix/Eliquis Discussed with Dr. Myra Gianotti on 5/24-okay to stop Plavix (as he is on Eliquis) Remains on aspirin/statin  DM-2 CBGs on the higher side Add 4 units of Semglee with meals Increase Semglee to 14 units Not a candidate for aggressive glycemic control-will allow some amount of permissive hyperglycemia    Recent Labs  01/21/23 1541 01/21/23 1959 01/22/23 0722  GLUCAP 309* 267* 269*    COPD Stable Bronchodilators  Debility/deconditioning Secondary to acute illness-frailty at baseline Adamantly refuses SNF-wants to go home with home health.  Understands risk  of rehospitalization/decompensation.  Claims lives with 2 roommates-and has 7 dogs that he misses.  BMI: Estimated body mass index is 18.12 kg/m as calculated from the following:   Height as of this encounter: 5\' 1"  (1.549 m).   Weight as of this encounter: 43.5 kg.   Code status:   Code Status: DNR   DVT Prophylaxis: IV heparin gtt apixaban (ELIQUIS) tablet 5 mg   Family Communication: Niece-[name] [phone] VM on 5/25, 5/26,5/27   Disposition Plan: Status is: Observation The patient will require care spanning > 2 midnights and should be moved to inpatient because: Severity of illness.   Planned Discharge Destination:Home health   Diet: Diet Order             Diet Heart Room service appropriate? No; Fluid consistency: Thin  Diet effective now                     Antimicrobial agents: Anti-infectives (From admission, onward)    Start     Dose/Rate Route Frequency Ordered Stop   01/19/23 1130  ciprofloxacin (CIPRO) IVPB 400 mg        400 mg 200 mL/hr over 60 Minutes Intravenous  Once 01/19/23 1037 01/19/23 1230   01/14/23 1745  cefTRIAXone (ROCEPHIN) 1 g in sodium chloride 0.9 % 100 mL IVPB        1 g 200 mL/hr over 30 Minutes Intravenous  Once 01/14/23 1741 01/14/23 1922   01/14/23 1745  azithromycin (ZITHROMAX) 500 mg in sodium chloride 0.9 % 250 mL IVPB        500 mg 250 mL/hr over 60 Minutes Intravenous  Once 01/14/23 1741 01/14/23 2315        MEDICATIONS: Scheduled Meds:   stroke: early stages of recovery book   Does not apply Once   amLODipine  5 mg Oral Daily   apixaban  5 mg Oral BID   aspirin EC  81 mg Oral Daily   feeding supplement  237 mL Oral TID BM   gabapentin  300 mg Oral TID   insulin aspart  0-15 Units Subcutaneous TID WC   insulin aspart  0-5 Units Subcutaneous QHS   insulin glargine-yfgn  14 Units Subcutaneous Daily   metoprolol succinate  25 mg Oral Daily   multivitamin with minerals  1 tablet Oral Daily    pantoprazole  40 mg Oral Daily   rosuvastatin  20 mg Oral Daily   umeclidinium bromide  1 puff Inhalation QHS   Continuous Infusions:  sodium chloride     PRN Meds:.acetaminophen **OR** acetaminophen (TYLENOL) oral liquid 160 mg/5 mL **OR** acetaminophen, albuterol, alum & mag hydroxide-simeth, hydrALAZINE, ondansetron (ZOFRAN) IV   I have personally reviewed following labs and imaging studies  LABORATORY DATA: CBC: Recent Labs  Lab 01/17/23 0346 01/18/23 0345 01/19/23 0200 01/20/23 0401 01/21/23 0426  WBC 8.4 9.5 10.3 5.6 10.8*  HGB 9.3* 9.5* 8.3* 8.3* 9.1*  HCT 28.7* 30.3* 25.2* 25.5* 27.5*  MCV 88.0 90.7 89.4 87.6 87.9  PLT 285 293 261 262 300     Basic Metabolic Panel: Recent Labs  Lab 01/18/23 0345 01/19/23 0200 01/20/23 0401 01/21/23 0426 01/22/23 0438  NA 128* 123* 125* 124* 126*  K 3.9 3.8 5.2* 4.6 5.1  CL 99 91*  88* 85* 89*  CO2 20* 23 24 28 25   GLUCOSE 173* 229* 217* 131* 301*  BUN 12 18 22  24* 34*  CREATININE 0.92 0.99 1.07 1.26* 1.31*  CALCIUM 8.3* 7.6* 9.3 9.1 9.0     GFR: Estimated Creatinine Clearance: 30.4 mL/min (A) (by C-G formula based on SCr of 1.31 mg/dL (H)).  Liver Function Tests: Recent Labs  Lab 01/16/23 0708 01/18/23 0345  AST 32 29  ALT 22 19  ALKPHOS 73 47  BILITOT 0.6 0.2*  PROT 6.2* 5.9*  ALBUMIN 3.3* 2.9*    No results for input(s): "LIPASE", "AMYLASE" in the last 168 hours.  No results for input(s): "AMMONIA" in the last 168 hours.  Coagulation Profile: No results for input(s): "INR", "PROTIME" in the last 168 hours.   Cardiac Enzymes: No results for input(s): "CKTOTAL", "CKMB", "CKMBINDEX", "TROPONINI" in the last 168 hours.  BNP (last 3 results) No results for input(s): "PROBNP" in the last 8760 hours.  Lipid Profile: No results for input(s): "CHOL", "HDL", "LDLCALC", "TRIG", "CHOLHDL", "LDLDIRECT" in the last 72 hours.   Thyroid Function Tests: No results for input(s): "TSH", "T4TOTAL", "FREET4",  "T3FREE", "THYROIDAB" in the last 72 hours.  Anemia Panel: No results for input(s): "VITAMINB12", "FOLATE", "FERRITIN", "TIBC", "IRON", "RETICCTPCT" in the last 72 hours.  Urine analysis:    Component Value Date/Time   COLORURINE YELLOW 01/14/2023 1059   APPEARANCEUR CLEAR 01/14/2023 1059   LABSPEC 1.017 01/14/2023 1059   PHURINE 5.0 01/14/2023 1059   GLUCOSEU NEGATIVE 01/14/2023 1059   HGBUR SMALL (A) 01/14/2023 1059   BILIRUBINUR NEGATIVE 01/14/2023 1059   KETONESUR 5 (A) 01/14/2023 1059   PROTEINUR 100 (A) 01/14/2023 1059   UROBILINOGEN 0.2 07/07/2015 1842   NITRITE NEGATIVE 01/14/2023 1059   LEUKOCYTESUR NEGATIVE 01/14/2023 1059    Sepsis Labs: Lactic Acid, Venous    Component Value Date/Time   LATICACIDVEN 1.6 01/10/2022 1854    MICROBIOLOGY: No results found for this or any previous visit (from the past 240 hour(s)).  RADIOLOGY STUDIES/RESULTS: No results found.   LOS: 7 days   [name] Massed, MD  [facility]    To contact the attending provider between 7A-7P or the covering provider during after hours 7P-7A, please log into the web site [email or web address] and access using universal [place] password for that web site. If you do not have the password, please call the hospital operator.  01/22/2023, 10:22 AM

## 2023-01-23 DIAGNOSIS — I10 Essential (primary) hypertension: Secondary | ICD-10-CM | POA: Diagnosis not present

## 2023-01-23 DIAGNOSIS — Z66 Do not resuscitate: Secondary | ICD-10-CM | POA: Diagnosis not present

## 2023-01-23 DIAGNOSIS — J449 Chronic obstructive pulmonary disease, unspecified: Secondary | ICD-10-CM | POA: Diagnosis not present

## 2023-01-23 DIAGNOSIS — E8729 Other acidosis: Secondary | ICD-10-CM | POA: Diagnosis not present

## 2023-01-23 LAB — POCT I-STAT 7, (LYTES, BLD GAS, ICA,H+H)
Acid-Base Excess: 4 mmol/L — ABNORMAL HIGH (ref 0.0–2.0)
Bicarbonate: 27.4 mmol/L (ref 20.0–28.0)
Calcium, Ion: 1.28 mmol/L (ref 1.15–1.40)
HCT: 28 % — ABNORMAL LOW (ref 39.0–52.0)
Hemoglobin: 9.5 g/dL — ABNORMAL LOW (ref 13.0–17.0)
O2 Saturation: 96 %
Potassium: 4.1 mmol/L (ref 3.5–5.1)
Sodium: 132 mmol/L — ABNORMAL LOW (ref 135–145)
TCO2: 29 mmol/L (ref 22–32)
pCO2 arterial: 37.4 mmHg (ref 32–48)
pH, Arterial: 7.473 — ABNORMAL HIGH (ref 7.35–7.45)
pO2, Arterial: 76 mmHg — ABNORMAL LOW (ref 83–108)

## 2023-01-23 LAB — BASIC METABOLIC PANEL
Anion gap: 9 (ref 5–15)
BUN: 34 mg/dL — ABNORMAL HIGH (ref 8–23)
CO2: 26 mmol/L (ref 22–32)
Calcium: 9 mg/dL (ref 8.9–10.3)
Chloride: 92 mmol/L — ABNORMAL LOW (ref 98–111)
Creatinine, Ser: 1.18 mg/dL (ref 0.61–1.24)
GFR, Estimated: >60 mL/min (ref >60)
Glucose, Bld: 198 mg/dL — ABNORMAL HIGH (ref 70–99)
Potassium: 5.1 mmol/L (ref 3.5–5.1)
Sodium: 127 mmol/L — ABNORMAL LOW (ref 135–145)

## 2023-01-23 LAB — GLUCOSE, CAPILLARY
Glucose-Capillary: 330 mg/dL — ABNORMAL HIGH (ref 70–99)
Glucose-Capillary: 157 mg/dL — ABNORMAL HIGH (ref 70–99)
Glucose-Capillary: 115 mg/dL — ABNORMAL HIGH (ref 70–99)
Glucose-Capillary: 169 mg/dL — ABNORMAL HIGH (ref 70–99)

## 2023-01-23 LAB — HEPARIN LEVEL (UNFRACTIONATED): Heparin Unfractionated: >1.1 IU/mL — ABNORMAL HIGH (ref 0.30–0.70)

## 2023-01-23 LAB — APTT: aPTT: 39 seconds — ABNORMAL HIGH (ref 24–36)

## 2023-01-23 MED ORDER — CLOPIDOGREL BISULFATE 75 MG PO TABS
75.0000 mg | ORAL_TABLET | Freq: Every day | ORAL | Status: DC
  Administered 2023-01-23 – 2023-01-29 (×6): 75 mg via ORAL
  Filled 2023-01-23 (×6): qty 1

## 2023-01-23 MED ORDER — HEPARIN (PORCINE) 25000 UT/250ML-% IV SOLN
900.0000 [IU]/h | INTRAVENOUS | Status: DC
  Administered 2023-01-23: 600 [IU]/h via INTRAVENOUS
  Administered 2023-01-24 – 2023-01-26 (×2): 1000 [IU]/h via INTRAVENOUS
  Filled 2023-01-23 (×4): qty 250

## 2023-01-23 MED ORDER — SODIUM CHLORIDE 0.9 % IV SOLN: INTRAVENOUS | Status: AC

## 2023-01-23 NOTE — Progress Notes (Addendum)
72 year old male with COPD, coronary artery disease, diabetes, hypertension, hyperlipidemia, PAD admitted with bedbugs and defecating on himself.  Vascular surgery was called today given concern for ischemic changes to the right foot.  He also has a right lateral heel ulcer.  He is well-known to vascular surgery and has undergone multiple interventions by Dr. Myra Gianotti as noted below.  Right SFA stent 09/22/2016 Right PT atherectomy 12/08/2016 Angioplasty of right common femoral artery and SFA 04/20/2017 Right PT angioplasty 03/31/2022 Right popliteal artery stent with right PT and peroneal angioplasty on 09/29/2022    Complaining of foot pain since admission on 01/14/23.  Now with noted color change.  On exam he has a palpable femoral pulse with a multiphasic popliteal signal behind the right knee.  I do not get any tibial signals in the foot.  I suspect he has occluded his previous tibial intervention for CLI with tissue loss.  He does not awaken to my exam.  Unclear on his motor or sensory status.  I discussed with the hospitalist holding his Eliquis and transitioning to heparin.  Will need angiogram early this week.  This is a limb-threatening situation and high risk for limb loss.    Cephus Shelling, MD [facility] Office: [phone]   Cephus Shelling

## 2023-01-23 NOTE — Progress Notes (Signed)
ANTICOAGULATION CONSULT NOTE - Initial Consult  Pharmacy Consult for heparin Indication: atrial fibrillation  Allergies  Allergen Reactions   Actos [Pioglitazone] Shortness Of Breath    Leg swelling    Lipitor [Atorvastatin] Other (See Comments)    Leg pain    Zestril [Lisinopril] Cough    Patient Measurements: Height: 5\' 1"  (154.9 cm) Weight: 43.5 kg (95 lb 14.4 oz) IBW/kg (Calculated) : 52.3 Heparin Dosing Weight: 43.5 kg  Vital Signs: Temp: 97.8 F (36.6 C) (06/01 0400) Temp Source: Oral (06/01 0818) BP: 149/80 (06/01 0400) Pulse Rate: 100 (06/01 0400)  Labs: Recent Labs    01/21/23 0426 01/22/23 0438 01/23/23 0224  HGB 9.1*  --   --   HCT 27.5*  --   --   PLT 300  --   --   HEPARINUNFRC 0.43  --   --   CREATININE 1.26* 1.31* 1.18    Estimated Creatinine Clearance: 33.8 mL/min (by C-G formula based on SCr of 1.18 mg/dL).   Medical History: Past Medical History:  Diagnosis Date   Carotid artery disease (HCC)    L-ICA 100%, mod R-ICA dz   COPD (chronic obstructive pulmonary disease) (HCC)    Coronary artery disease    s/p CABG February 2010 by Dr. Andrey Spearman   Diabetes [facility])    Hyperlipidemia    Hypertension    Peripheral arterial disease (HCC)    post left common iliac and right SFA stenting remotely   Tobacco abuse         Medications:  Eliquis 2.5mg  BID PTA, last dose unknown  Restarted Eliquis 5mg  BID inpatient from 5/30 - 5/31 (4 doses)   Assessment: Reginald Ayers is a 72 year old male with a history of Afib on Eliquis prior to admission. He was restarted on Eliquis after being deemed not a candidate for cholecystectomy given high risk profile. His last dose of eliquis was 01/22/2023 PM. Heparin now to be restarted given concern for ischemic foot. Will start heparin 12 hours after last eliquis dose and avoid bolus given recent use of therapeutic anticoagulation. Will measure aPTT and anti-Xa until correlating.   Goal of Therapy:  Heparin level  0.3-0.7 units/ml Monitor platelets by anticoagulation protocol: Yes   Plan:  No bolus given recent DOAC use  Start heparin infusion at 600 units/hr Check anti-Xa level and aPTT in 8 hours and daily while on heparin Continue to monitor H&H and platelets  Blane Ohara, PharmD  PGY1 Pharmacy Resident

## 2023-01-23 NOTE — Progress Notes (Addendum)
ANTICOAGULATION CONSULT NOTE  Pharmacy Consult for heparin Indication: atrial fibrillation  Allergies  Allergen Reactions   Actos [Pioglitazone] Shortness Of Breath    Leg swelling    Lipitor [Atorvastatin] Other (See Comments)    Leg pain    Zestril [Lisinopril] Cough    Patient Measurements: Height: 5\' 1"  (154.9 cm) Weight: 43.5 kg (95 lb 14.4 oz) IBW/kg (Calculated) : 52.3 Heparin Dosing Weight: 43.5 kg  Vital Signs: Temp: 98.4 F (36.9 C) (06/01 1700) Temp Source: Oral (06/01 1700) BP: 126/61 (06/01 1700)  Labs: Recent Labs    01/21/23 0426 01/22/23 0438 01/23/23 0224 01/23/23 1218 01/23/23 1805  HGB 9.1*  --   --  9.5*  --   HCT 27.5*  --   --  28.0*  --   PLT 300  --   --   --   --   APTT  --   --   --   --  39*  HEPARINUNFRC 0.43  --   --   --  >1.10*  CREATININE 1.26* 1.31* 1.18  --   --      Estimated Creatinine Clearance: 33.8 mL/min (by C-G formula based on SCr of 1.18 mg/dL).   Medical History: Past Medical History:  Diagnosis Date   Carotid artery disease (HCC)    L-ICA 100%, mod R-ICA dz   COPD (chronic obstructive pulmonary disease) (HCC)    Coronary artery disease    s/p CABG February 2010 by Dr. Andrey Spearman   Diabetes [facility])    Hyperlipidemia    Hypertension    Peripheral arterial disease (HCC)    post left common iliac and right SFA stenting remotely   Tobacco abuse         Medications:  Eliquis 2.5mg  BID PTA, last dose unknown  Restarted Eliquis 5mg  BID inpatient from 5/30 - 5/31 (4 doses)   Assessment: Reginald Ayers is a 72 year old male with a history of Afib on Eliquis prior to admission. He was restarted on Eliquis after being deemed not a candidate for cholecystectomy given high risk profile. His last dose of eliquis was 01/22/2023 PM. Heparin now to be restarted given concern for ischemic foot. Will start heparin 12 hours after last eliquis dose and avoid bolus given recent use of therapeutic anticoagulation. Will measure aPTT  and anti-Xa until correlating.   Initial aPTT is subtherapeutic at 39 seconds, heparin level altered by DOAC. No infusion issues or bleeding throughout the day per RN.  Goal of Therapy:  Heparin level 0.3-0.7 units/ml Monitor platelets by anticoagulation protocol: Yes   Plan:  Increase heparin to 750 units/h Recheck aPTT and heparin level in am  Fredonia Highland, PharmD, [place], [facility] Clinical Pharmacist [phone] Please check AMION for all [facility] Pharmacy numbers 01/23/2023

## 2023-01-23 NOTE — Progress Notes (Signed)
PROGRESS NOTE        PATIENT DETAILS Name: Reginald Ayers Age: 72 y.o. Sex: male Date of Birth: May 25, 1951 Admit Date: 01/14/2023 Admitting Physician Jonah Blue, MD ZOX:[id number], Selena Batten, NP  Brief Summary: Patient is a 72 y.o.  male COPD, CAD s/p CABG, PAD s/p stent, HLD, HTN-who presented with weakness.  Per H&P-patient was found at home with bedbugs, and covered in fecal matter.   Significant events: 5/23>> admit to [facility]-weakness/nausea/vomiting-found covered in fecal matter/bedbugs. 5/24>> MRCP with choledocholithiasis 5/28>> ERCP-sphincterotomy 5/29>> evaluation by cardiology-high risk for cholecystectomy 5/30>> General surgery signed off-no plans to pursue cholecystectomy. 6/01>> right foot toes-purple-right foot is cold.  Vascular surgery consulted.  Significant studies: 5/23>> MRI brain: No acute abnormality-chronically occluded left ICA 5/23>> CT abdomen/pelvis: Mild groundglass density in the right lower lobe, distended gallbladder  without calcified stone, dilated CBD measuring up to 19 mm with suspicion for intrahepatic dilatation. 5/24>> MRCP:Severe intrahepatic/extrahepatic biliary ductal dilatation-tiny gallstones/sludge within the cystic duct near the ampulla.  Significant microbiology data: None  Procedures: 5/28>> ERCP-sphincterotomy  Consults: GI General surgery Cardiology Vascular surgery  Subjective: Was difficult to arouse earlier this morning-he is much more awake and alert this afternoon.  Easily arouses to verbal stimuli-complains of pain in his foot bilaterally.  Unfortunately-this morning he has purplish discoloration of his right toes-right foot is cold.  Evaluated by vascular surgery earlier this morning.  Objective: Vitals: Blood pressure (!) 149/80, pulse 100, temperature 97.8 F (36.6 C), temperature source Oral, resp. rate 18, height 5\' 1"  (1.549 m), weight 43.5 kg, SpO2 91 %.   Exam: Gen Exam:Alert awake-not in  any distress HEENT:atraumatic, normocephalic Chest: B/L clear to auscultation anteriorly CVS:S1S2 regular Abdomen:soft non tender, non distended Extremities: Some toes are purple-right foot is now cold. Neurology: Non focal Skin: no rash  Pertinent Labs/Radiology:    Latest Ref Rng & Units 01/23/2023   12:18 PM 01/21/2023    4:26 AM 01/20/2023    4:01 AM  CBC  WBC 4.0 - 10.5 K/uL  10.8  5.6   Hemoglobin 13.0 - 17.0 g/dL 9.5  9.1  8.3   Hematocrit 39.0 - 52.0 % 28.0  27.5  25.5   Platelets 150 - 400 K/uL  300  262     Lab Results  Component Value Date   NA 132 (L) 01/23/2023   K 4.1 01/23/2023   CL 92 (L) 01/23/2023   CO2 26 01/23/2023      Assessment/Plan: Weakness/nausea/vomiting Dilated bile ducts secondary to choledocholithiasis Poor historian-unclear etiology-given MRCP findings-concerned that his clinical presentation on admission could have been due to a passed CBD stone/symptomatic choledocholithiasis.  He is s/p ERCP on 5/28-not felt to be a good candidate for cholecystectomy given high risk profile. Nausea/vomiting has resolved-he is tolerating regular diet.  Anion gap metabolic acidosis Likely starvation ketoacidosis-due to poor intake/possible vomiting-in the setting of possible passed CBD stone. Improving with supportive care  AKI Mild Improved with supportive care.   Hyponatremia Mild Asymptomatic Seems to be slowly improving with gentle IV fluid hydration.  HTN BP better controlled with amlodipine/metoprolol.  CAD-s/p CABG No anginal symptoms See below regarding antiplatelets. Continue statin/beta-blocker/antiplatelets  History of CVA No obvious deficits on exam-MRI brain negative Continue statin See below regarding antiplatelets.  PAF Sinus rhythm Eliquis was initially held-subsequently placed on IV heparin-since no cholecystectomy was planned-transition back to [employer]  unfortunately now has developed ischemic changes in the right  foot-stopping Eliquis and changing back to IV heparin.    PAD Right foot ischemic foot Has had numerous procedures in the past-follow with Dr. Myra Gianotti Prior to this hospitalization he was on aspirin/Plavix/Eliquis Discussed with Dr. Myra Gianotti on 5/24-okay to stop Plavix on discharge and maintain on aspirin/Eliquis-however he unfortunately-he has had worsening pain in his bilateral foot for the past several days-this morning-his right foot is cold with purplish discoloration.  Vascular surgery now following-he remains on Plavix-and is now on IV heparin.  This is felt to be related to chronic disease and not secondary to an embolic phenomenon.  Unfortunately has occurred in spite of patient being maintained on anticoagulation.  Discussed with patient this afternoon-he is aware that this potentially could be a limb threatening situation.  Very difficult situation.  Acute toxic metabolic encephalopathy Somewhat hard to arouse this morning-but much more easily aroused this afternoon.  ABG stable-nonfocal exam. Suspect this may be due to gabapentin-will discontinue and monitor closely.  DM-2 CBGs on the higher side-but due to lingering encephalopathy today-will not adjust insulin doses-as oral intake is poor Continue Semglee 14 units daily, 4 units of NovoLog with send SSI. Watch closely-he is very frail-and is not a candidate for aggressive glycemic control.    Recent Labs    01/22/23 2147 01/23/23 0816 01/23/23 1040  GLUCAP 246* 330* 157*    COPD Stable Bronchodilators  Debility/deconditioning Failure to thrive Secondary to acute illness-frailty at baseline [facility] refuses SNF-wants to go home with home health.  Understands risk of rehospitalization/decompensation.  Claims lives with 2 roommates-and has 7 dogs that he misses.  BMI: Estimated body mass index is 18.12 kg/m as calculated from the following:   Height as of this encounter: 5\' 1"  (1.549 m).   Weight as of this encounter:  43.5 kg.   Code status:   Code Status: DNR   DVT Prophylaxis: IV heparin gtt   Family Communication: Niece-[name] [phone]-left VM on 5/25, 5/26,5/27   Disposition Plan: Status is: Observation The patient will require care spanning > 2 midnights and should be moved to inpatient because: Severity of illness.   Planned Discharge Destination:Home health   Diet: Diet Order             Diet Heart Room service appropriate? No; Fluid consistency: Thin  Diet effective now                     Antimicrobial agents: Anti-infectives (From admission, onward)    Start     Dose/Rate Route Frequency Ordered Stop   01/19/23 1130  ciprofloxacin (CIPRO) IVPB 400 mg        400 mg 200 mL/hr over 60 Minutes Intravenous  Once 01/19/23 1037 01/19/23 1230   01/14/23 1745  cefTRIAXone (ROCEPHIN) 1 g in sodium chloride 0.9 % 100 mL IVPB        1 g 200 mL/hr over 30 Minutes Intravenous  Once 01/14/23 1741 01/14/23 1922   01/14/23 1745  azithromycin (ZITHROMAX) 500 mg in sodium chloride 0.9 % 250 mL IVPB        500 mg 250 mL/hr over 60 Minutes Intravenous  Once 01/14/23 1741 01/14/23 2315        MEDICATIONS: Scheduled Meds:   stroke: early stages of recovery book   Does not apply Once   amLODipine  5 mg Oral Daily   clopidogrel  75 mg Oral Daily   feeding supplement  237 mL  Oral TID BM   insulin aspart  0-15 Units Subcutaneous TID WC   insulin aspart  0-5 Units Subcutaneous QHS   insulin aspart  4 Units Subcutaneous TID WC   insulin glargine-yfgn  14 Units Subcutaneous Daily   metoprolol succinate  25 mg Oral Daily   multivitamin with minerals  1 tablet Oral Daily   pantoprazole  40 mg Oral Daily   polyethylene glycol  17 g Oral Daily   rosuvastatin  20 mg Oral Daily   umeclidinium bromide  1 puff Inhalation QHS   Continuous Infusions:  sodium chloride 50 mL/hr at 01/23/23 1307   heparin 600 Units/hr (01/23/23 1032)   PRN Meds:.acetaminophen **OR** acetaminophen  (TYLENOL) oral liquid 160 mg/5 mL **OR** acetaminophen, albuterol, alum & mag hydroxide-simeth, hydrALAZINE, ondansetron (ZOFRAN) IV   I have personally reviewed following labs and imaging studies  LABORATORY DATA: CBC: Recent Labs  Lab 01/17/23 0346 01/18/23 0345 01/19/23 0200 01/20/23 0401 01/21/23 0426 01/23/23 1218  WBC 8.4 9.5 10.3 5.6 10.8*  --   HGB 9.3* 9.5* 8.3* 8.3* 9.1* 9.5*  HCT 28.7* 30.3* 25.2* 25.5* 27.5* 28.0*  MCV 88.0 90.7 89.4 87.6 87.9  --   PLT 285 293 261 262 300  --      Basic Metabolic Panel: Recent Labs  Lab 01/19/23 0200 01/20/23 0401 01/21/23 0426 01/22/23 0438 01/23/23 0224 01/23/23 1218  NA 123* 125* 124* 126* 127* 132*  K 3.8 5.2* 4.6 5.1 5.1 4.1  CL 91* 88* 85* 89* 92*  --   CO2 23 24 28 25 26   --   GLUCOSE 229* 217* 131* 301* 198*  --   BUN 18 22 24* 34* 34*  --   CREATININE 0.99 1.07 1.26* 1.31* 1.18  --   CALCIUM 7.6* 9.3 9.1 9.0 9.0  --      GFR: Estimated Creatinine Clearance: 33.8 mL/min (by C-G formula based on SCr of 1.18 mg/dL).  Liver Function Tests: Recent Labs  Lab 01/18/23 0345  AST 29  ALT 19  ALKPHOS 47  BILITOT 0.2*  PROT 5.9*  ALBUMIN 2.9*    No results for input(s): "LIPASE", "AMYLASE" in the last 168 hours.  No results for input(s): "AMMONIA" in the last 168 hours.  Coagulation Profile: No results for input(s): "INR", "PROTIME" in the last 168 hours.   Cardiac Enzymes: No results for input(s): "CKTOTAL", "CKMB", "CKMBINDEX", "TROPONINI" in the last 168 hours.  BNP (last 3 results) No results for input(s): "PROBNP" in the last 8760 hours.  Lipid Profile: No results for input(s): "CHOL", "HDL", "LDLCALC", "TRIG", "CHOLHDL", "LDLDIRECT" in the last 72 hours.   Thyroid Function Tests: No results for input(s): "TSH", "T4TOTAL", "FREET4", "T3FREE", "THYROIDAB" in the last 72 hours.  Anemia Panel: No results for input(s): "VITAMINB12", "FOLATE", "FERRITIN", "TIBC", "IRON", "RETICCTPCT" in the  last 72 hours.  Urine analysis:    Component Value Date/Time   COLORURINE YELLOW 01/14/2023 1059   APPEARANCEUR CLEAR 01/14/2023 1059   LABSPEC 1.017 01/14/2023 1059   PHURINE 5.0 01/14/2023 1059   GLUCOSEU NEGATIVE 01/14/2023 1059   HGBUR SMALL (A) 01/14/2023 1059   BILIRUBINUR NEGATIVE 01/14/2023 1059   KETONESUR 5 (A) 01/14/2023 1059   PROTEINUR 100 (A) 01/14/2023 1059   UROBILINOGEN 0.2 07/07/2015 1842   NITRITE NEGATIVE 01/14/2023 1059   LEUKOCYTESUR NEGATIVE 01/14/2023 1059    Sepsis Labs: Lactic Acid, Venous    Component Value Date/Time   LATICACIDVEN 1.6 01/10/2022 1854    MICROBIOLOGY: No results found for this  or any previous visit (from the past 240 hour(s)).  RADIOLOGY STUDIES/RESULTS: No results found.   LOS: 8 days   [name] Massed, MD  [facility]    To contact the attending provider between 7A-7P or the covering provider during after hours 7P-7A, please log into the web site [email or web address] and access using universal [place] password for that web site. If you do not have the password, please call the hospital operator.  01/23/2023, 2:00 PM

## 2023-01-24 ENCOUNTER — Encounter (HOSPITAL_COMMUNITY): Payer: Self-pay | Admitting: Gastroenterology

## 2023-01-24 DIAGNOSIS — E8729 Other acidosis: Secondary | ICD-10-CM | POA: Diagnosis not present

## 2023-01-24 LAB — BASIC METABOLIC PANEL
Anion gap: 10 (ref 5–15)
BUN: 28 mg/dL — ABNORMAL HIGH (ref 8–23)
CO2: 25 mmol/L (ref 22–32)
Calcium: 8.9 mg/dL (ref 8.9–10.3)
Chloride: 94 mmol/L — ABNORMAL LOW (ref 98–111)
Creatinine, Ser: 1.12 mg/dL (ref 0.61–1.24)
GFR, Estimated: >60 mL/min (ref >60)
Glucose, Bld: 187 mg/dL — ABNORMAL HIGH (ref 70–99)
Potassium: 4.7 mmol/L (ref 3.5–5.1)
Sodium: 129 mmol/L — ABNORMAL LOW (ref 135–145)

## 2023-01-24 LAB — GLUCOSE, CAPILLARY
Glucose-Capillary: 179 mg/dL — ABNORMAL HIGH (ref 70–99)
Glucose-Capillary: 220 mg/dL — ABNORMAL HIGH (ref 70–99)
Glucose-Capillary: 88 mg/dL (ref 70–99)
Glucose-Capillary: 199 mg/dL — ABNORMAL HIGH (ref 70–99)

## 2023-01-24 LAB — APTT
aPTT: 53 seconds — ABNORMAL HIGH (ref 24–36)
aPTT: 50 seconds — ABNORMAL HIGH (ref 24–36)
aPTT: 76 seconds — ABNORMAL HIGH (ref 24–36)

## 2023-01-24 LAB — URIC ACID: Uric Acid, Serum: 2.9 mg/dL — ABNORMAL LOW (ref 3.7–8.6)

## 2023-01-24 LAB — TYPE AND SCREEN
ABO/RH(D): O NEG
Antibody Screen: NEGATIVE

## 2023-01-24 LAB — CBC
HCT: 28 % — ABNORMAL LOW (ref 39.0–52.0)
Hemoglobin: 8.8 g/dL — ABNORMAL LOW (ref 13.0–17.0)
MCH: 28.8 pg (ref 26.0–34.0)
MCHC: 31.4 g/dL (ref 30.0–36.0)
MCV: 91.5 fL (ref 80.0–100.0)
Platelets: 307 10*3/uL (ref 150–400)
RBC: 3.06 MIL/uL — ABNORMAL LOW (ref 4.22–5.81)
RDW: 14.3 % (ref 11.5–15.5)
WBC: 11.4 10*3/uL — ABNORMAL HIGH (ref 4.0–10.5)
nRBC: 0 % (ref 0.0–0.2)

## 2023-01-24 LAB — SODIUM, URINE, RANDOM: Sodium, Ur: 83 mmol/L

## 2023-01-24 LAB — HEPARIN LEVEL (UNFRACTIONATED): Heparin Unfractionated: >1.1 IU/mL — ABNORMAL HIGH (ref 0.30–0.70)

## 2023-01-24 LAB — OSMOLALITY: Osmolality: 290 mOsm/kg (ref 275–295)

## 2023-01-24 LAB — OSMOLALITY, URINE: Osmolality, Ur: 342 mOsm/kg (ref 300–900)

## 2023-01-24 LAB — BRAIN NATRIURETIC PEPTIDE: B Natriuretic Peptide: 264.5 pg/mL — ABNORMAL HIGH (ref 0.0–100.0)

## 2023-01-24 LAB — MAGNESIUM: Magnesium: 1.8 mg/dL (ref 1.7–2.4)

## 2023-01-24 LAB — C-REACTIVE PROTEIN: CRP: 2.6 mg/dL — ABNORMAL HIGH (ref <1.0)

## 2023-01-24 LAB — CREATININE, URINE, RANDOM: Creatinine, Urine: 21 mg/dL

## 2023-01-24 LAB — PROCALCITONIN: Procalcitonin: 0.31 ng/mL

## 2023-01-24 MED ORDER — TRAMADOL HCL 50 MG PO TABS
50.0000 mg | ORAL_TABLET | Freq: Four times a day (QID) | ORAL | Status: DC | PRN
  Administered 2023-01-24 – 2023-01-30 (×7): 50 mg via ORAL
  Filled 2023-01-24 (×7): qty 1

## 2023-01-24 NOTE — Progress Notes (Signed)
ANTICOAGULATION CONSULT NOTE Pharmacy Consult for heparin Indication: atrial fibrillation Brief A/P: aPTT subtherapeutic  Increase Heparin  rate Allergies  Allergen Reactions   Actos [Pioglitazone] Shortness Of Breath    Leg swelling    Lipitor [Atorvastatin] Other (See Comments)    Leg pain    Zestril [Lisinopril] Cough    Patient Measurements: Height: 5\' 1"  (154.9 cm) Weight: 43.5 kg (95 lb 14.4 oz) IBW/kg (Calculated) : 52.3 Heparin Dosing Weight: 43.5 kg  Vital Signs: Temp: 98.6 F (37 C) (06/01 1955) Temp Source: Oral (06/01 1955) BP: 141/56 (06/01 1955) Pulse Rate: 100 (06/01 1955)  Labs: Recent Labs    01/22/23 0438 01/23/23 0224 01/23/23 1218 01/23/23 1805 01/24/23 0341  HGB  --   --  9.5*  --  8.8*  HCT  --   --  28.0*  --  28.0*  PLT  --   --   --   --  307  APTT  --   --   --  39* 53*  HEPARINUNFRC  --   --   --  >1.10* >1.10*  CREATININE 1.31* 1.18  --   --  1.12     Estimated Creatinine Clearance: 35.6 mL/min (by C-G formula based on SCr of 1.12 mg/dL).  Assessment: 72 y.o. male with h/o Afib, Eliquis on hold, for heparin  Goal of Therapy:  Heparin level 0.3-0.7 units/ml Monitor platelets by anticoagulation protocol: Yes   Plan:  Increase Heparin 850 units/hr Check aPTT in 8 hours  Geannie Risen, PharmD, BCPS

## 2023-01-24 NOTE — Progress Notes (Signed)
ANTICOAGULATION CONSULT NOTE  Pharmacy Consult for heparin Indication: atrial fibrillation  Allergies  Allergen Reactions   Actos [Pioglitazone] Shortness Of Breath    Leg swelling    Lipitor [Atorvastatin] Other (See Comments)    Leg pain    Zestril [Lisinopril] Cough    Patient Measurements: Height: 5\' 1"  (154.9 cm) Weight: 43.5 kg (95 lb 14.4 oz) IBW/kg (Calculated) : 52.3 Heparin Dosing Weight: 43.5 kg  Vital Signs: Temp: 99.1 F (37.3 C) (06/02 2042) Temp Source: Oral (06/02 1225) BP: 154/71 (06/02 2042) Pulse Rate: 93 (06/02 2042)  Labs: Recent Labs    01/22/23 0438 01/23/23 0224 01/23/23 1218 01/23/23 1805 01/23/23 1805 01/24/23 0341 01/24/23 1213 01/24/23 2127  HGB  --   --  9.5*  --   --  8.8*  --   --   HCT  --   --  28.0*  --   --  28.0*  --   --   PLT  --   --   --   --   --  307  --   --   APTT  --   --   --  39*   < > 53* 50* 76*  HEPARINUNFRC  --   --   --  >1.10*  --  >1.10*  --   --   CREATININE 1.31* 1.18  --   --   --  1.12  --   --    < > = values in this interval not displayed.     Estimated Creatinine Clearance: 35.6 mL/min (by C-G formula based on SCr of 1.12 mg/dL).   Medical History: Past Medical History:  Diagnosis Date   Carotid artery disease (HCC)    L-ICA 100%, mod R-ICA dz   COPD (chronic obstructive pulmonary disease) (HCC)    Coronary artery disease    s/p CABG February 2010 by Dr. Andrey Spearman   Diabetes [facility])    Hyperlipidemia    Hypertension    Peripheral arterial disease (HCC)    post left common iliac and right SFA stenting remotely   Tobacco abuse         Medications:  Eliquis 2.5mg  BID PTA, last dose unknown  Restarted Eliquis 5mg  BID inpatient from 5/30 - 5/31 (4 doses)   Assessment: Reginald Ayers is a 72 year old male with a history of Afib on Eliquis prior to admission. He was restarted on Eliquis after being deemed not a candidate for cholecystectomy given high risk profile. His last dose of eliquis  was 01/22/2023 PM. Heparin now to be restarted given concern for ischemic foot. Will start heparin 12 hours after last eliquis dose and avoid bolus given recent use of therapeutic anticoagulation. Will measure aPTT and anti-Xa until correlating.   aPTT therapeutic at 76 seconds.  Goal of Therapy:  Heparin level 0.3-0.7 units/ml aPTT 66-102 seconds Monitor platelets by anticoagulation protocol: Yes   Plan:  Continue heparin 1000 units/h Daily aPTT, heparin level, CBC  Fredonia Highland, PharmD, [place], [facility] Clinical Pharmacist [phone] Please check AMION for all [facility] Pharmacy numbers 01/24/2023

## 2023-01-24 NOTE — Progress Notes (Addendum)
ANTICOAGULATION CONSULT NOTE  Pharmacy Consult for heparin Indication: atrial fibrillation  Allergies  Allergen Reactions   Actos [Pioglitazone] Shortness Of Breath    Leg swelling    Lipitor [Atorvastatin] Other (See Comments)    Leg pain    Zestril [Lisinopril] Cough    Patient Measurements: Height: 5\' 1"  (154.9 cm) Weight: 43.5 kg (95 lb 14.4 oz) IBW/kg (Calculated) : 52.3 Heparin Dosing Weight: 43.5 kg  Vital Signs: Temp: 98.6 F (37 C) (06/02 0400) Temp Source: Oral (06/02 0400) BP: 130/68 (06/02 0400) Pulse Rate: 92 (06/02 0400)  Labs: Recent Labs    01/22/23 0438 01/23/23 0224 01/23/23 1218 01/23/23 1805 01/24/23 0341  HGB  --   --  9.5*  --  8.8*  HCT  --   --  28.0*  --  28.0*  PLT  --   --   --   --  307  APTT  --   --   --  39* 53*  HEPARINUNFRC  --   --   --  >1.10* >1.10*  CREATININE 1.31* 1.18  --   --  1.12     Estimated Creatinine Clearance: 35.6 mL/min (by C-G formula based on SCr of 1.12 mg/dL).   Medical History: Past Medical History:  Diagnosis Date   Carotid artery disease (HCC)    L-ICA 100%, mod R-ICA dz   COPD (chronic obstructive pulmonary disease) (HCC)    Coronary artery disease    s/p CABG February 2010 by Dr. Andrey Spearman   Diabetes [facility])    Hyperlipidemia    Hypertension    Peripheral arterial disease (HCC)    post left common iliac and right SFA stenting remotely   Tobacco abuse         Medications:  Eliquis 2.5mg  BID PTA, last dose unknown  Restarted Eliquis 5mg  BID inpatient from 5/30 - 5/31 (4 doses)   Assessment: Esmond is a 72 year old male with a history of Afib on Eliquis prior to admission. He was restarted on Eliquis after being deemed not a candidate for cholecystectomy given high risk profile. His last dose of eliquis was 01/22/2023 PM. Heparin now to be restarted given concern for ischemic foot. Will start heparin 12 hours after last eliquis dose and avoid bolus given recent use of therapeutic  anticoagulation. Will measure aPTT and anti-Xa until correlating.   aPTT is subtherapeutic at 50 seconds on heparin running at 850 units/hr, heparin level altered by DOAC. No infusion issues or bleeding throughout the day per RN.  Goal of Therapy:  Heparin level 0.3-0.7 units/ml aPTT 66-102 seconds Monitor platelets by anticoagulation protocol: Yes   Plan:  Increase heparin to 1000 units/h Recheck aPTT and heparin level  Monitor for signs and symptoms of bleeding and CBC  Blane Ohara, PharmD  PGY1 Pharmacy Resident

## 2023-01-24 NOTE — Progress Notes (Signed)
PROGRESS NOTE        PATIENT DETAILS Name: JAHAIR Ayers Age: 72 y.o. Sex: male Date of Birth: 10-01-1950 Admit Date: 01/14/2023 Admitting Physician Jonah Blue, MD WGN:FAOZHY, Selena Batten, NP  Brief Summary: Patient is a 72 y.o.  male COPD, CAD s/p CABG, PAD s/p stent, HLD, HTN-who presented with weakness.  Per H&P-patient was found at home with bedbugs, and covered in fecal matter.   Significant events: 5/23>> admit to [facility]-weakness/nausea/vomiting-found covered in fecal matter/bedbugs. 5/24>> MRCP with choledocholithiasis 5/28>> ERCP-sphincterotomy 5/29>> evaluation by cardiology-high risk for cholecystectomy 5/30>> General surgery signed off-no plans to pursue cholecystectomy. 6/01>> right foot toes-purple-right foot is cold.  Vascular surgery consulted.  Significant studies: 5/23>> MRI brain: No acute abnormality-chronically occluded left ICA 5/23>> CT abdomen/pelvis: Mild groundglass density in the right lower lobe, distended gallbladder  without calcified stone, dilated CBD measuring up to 19 mm with suspicion for intrahepatic dilatation. 5/24>> MRCP:Severe intrahepatic/extrahepatic biliary ductal dilatation-tiny gallstones/sludge within the cystic duct near the ampulla.  Significant microbiology data: None  Procedures: 5/28>> ERCP-sphincterotomy  Consults: GI General surgery Cardiology Vascular surgery  Subjective:   Patient in bed, appears comfortable, denies any headache, no fever, no chest pain or pressure, no shortness of breath , no abdominal pain. No new focal weakness.  Right lower extremity pain and discomfort improved.   Objective: Vitals: Blood pressure (!) 146/68, pulse 92, temperature 98.6 F (37 C), temperature source Oral, resp. rate 14, height 5\' 1"  (1.549 m), weight 43.5 kg, SpO2 96 %.   Exam:  Awake Alert, No new F.N deficits, Normal affect [place].AT,PERRAL Supple Neck, No JVD,   Symmetrical Chest wall movement, Good air  movement bilaterally, CTAB RRR,No Gallops, Rubs or new Murmurs,  +ve B.Sounds, Abd Soft, No tenderness,   Right lower extremity all toes cyanotic, midfoot cyanotic, also some cyanotic blotches up to his mid leg,    Assessment/Plan:  Weakness/nausea/vomiting Dilated bile ducts secondary to choledocholithiasis Poor historian-unclear etiology-given MRCP findings-concerned that his clinical presentation on admission could have been due to a passed CBD stone/symptomatic choledocholithiasis.  He is s/p ERCP on 5/28-not felt to be a good candidate for cholecystectomy given high risk profile. Nausea/vomiting has resolved-he is tolerating regular diet.  Anion gap metabolic acidosis Likely starvation ketoacidosis-due to poor intake/possible vomiting-in the setting of possible passed CBD stone. Improving with supportive care  AKI Mild Improved with supportive care.   Hyponatremia Mild Asymptomatic Seems to be slowly improving with gentle IV fluid hydration.  HTN BP better controlled with amlodipine/metoprolol.  CAD-s/p CABG No anginal symptoms See below regarding antiplatelets. Continue statin/beta-blocker/antiplatelets  History of CVA No obvious deficits on exam-MRI brain negative Continue statin See below regarding antiplatelets.  PAF Sinus rhythm Eliquis was initially held-subsequently placed on IV heparin-since no cholecystectomy was planned-transition back to Eliquis-he unfortunately now has developed ischemic changes in the right foot-stopping Eliquis and changing back to IV heparin.    PAD Right foot ischemic foot Has had numerous procedures in the past-follow with Dr. Myra Gianotti Prior to this hospitalization he was on aspirin/Plavix/Eliquis Discussed with Dr. Myra Gianotti on 5/24-okay to stop Plavix on discharge and maintain on aspirin/Eliquis-however he unfortunately-he has had worsening pain in his bilateral foot for the past several days-this morning-his right foot is cold  with purplish discoloration.  Vascular surgery now following-he remains on Plavix-and is now on IV heparin.  This is  felt to be related to chronic disease and not secondary to an embolic phenomenon.  Unfortunately has occurred in spite of patient being maintained on anticoagulation.  Discussed with patient this afternoon-he is aware that this potentially could be a limb threatening situation.  VVS following.  Continue supportive care, pain improved on 01/24/2023.  Acute toxic metabolic encephalopathy Somewhat hard to arouse this morning-but much more easily aroused this afternoon.  ABG stable-nonfocal exam. Suspect this may be due to gabapentin-will discontinue and monitor closely.  DM-2 CBGs on the higher side-but due to lingering encephalopathy today-will not adjust insulin doses-as oral intake is poor Continue Semglee 14 units daily, 4 units of NovoLog with send SSI. Watch closely-he is very frail-and is not a candidate for aggressive glycemic control.    Recent Labs    01/23/23 1659 01/23/23 2029 01/24/23 0823  GLUCAP 115* 169* 179*   COPD Stable Bronchodilators  Debility/deconditioning Failure to thrive Secondary to acute illness-frailty at baseline [facility] refuses SNF-wants to go home with home health.  Understands risk of rehospitalization/decompensation.  Claims lives with 2 roommates-and has 7 dogs that he misses.  BMI: Estimated body mass index is 18.12 kg/m as calculated from the following:   Height as of this encounter: 5\' 1"  (1.549 m).   Weight as of this encounter: 43.5 kg.   Code status:   Code Status: DNR   DVT Prophylaxis: IV heparin gtt   Family Communication: Niece-[name] [phone]-left VM on 5/25, 5/26,5/27   Disposition Plan: Status is: Observation The patient will require care spanning > 2 midnights and should be moved to inpatient because: Severity of illness.   Planned Discharge Destination:Home health   Diet: Diet Order              Diet Heart Room service appropriate? No; Fluid consistency: Thin  Diet effective now                     Antimicrobial agents: Anti-infectives (From admission, onward)    Start     Dose/Rate Route Frequency Ordered Stop   01/19/23 1130  ciprofloxacin (CIPRO) IVPB 400 mg        400 mg 200 mL/hr over 60 Minutes Intravenous  Once 01/19/23 1037 01/19/23 1230   01/14/23 1745  cefTRIAXone (ROCEPHIN) 1 g in sodium chloride 0.9 % 100 mL IVPB        1 g 200 mL/hr over 30 Minutes Intravenous  Once 01/14/23 1741 01/14/23 1922   01/14/23 1745  azithromycin (ZITHROMAX) 500 mg in sodium chloride 0.9 % 250 mL IVPB        500 mg 250 mL/hr over 60 Minutes Intravenous  Once 01/14/23 1741 01/14/23 2315        MEDICATIONS: Scheduled Meds:   stroke: early stages of recovery book   Does not apply Once   amLODipine  5 mg Oral Daily   clopidogrel  75 mg Oral Daily   feeding supplement  237 mL Oral TID BM   insulin aspart  0-15 Units Subcutaneous TID WC   insulin aspart  0-5 Units Subcutaneous QHS   insulin aspart  4 Units Subcutaneous TID WC   insulin glargine-yfgn  14 Units Subcutaneous Daily   metoprolol succinate  25 mg Oral Daily   multivitamin with minerals  1 tablet Oral Daily   pantoprazole  40 mg Oral Daily   polyethylene glycol  17 g Oral Daily   rosuvastatin  20 mg Oral Daily   umeclidinium bromide  1 puff  Inhalation QHS   Continuous Infusions:  sodium chloride 50 mL/hr at 01/24/23 0448   heparin 850 Units/hr (01/24/23 0459)   PRN Meds:.acetaminophen **OR** acetaminophen (TYLENOL) oral liquid 160 mg/5 mL **OR** acetaminophen, albuterol, alum & mag hydroxide-simeth, hydrALAZINE, ondansetron (ZOFRAN) IV   I have personally reviewed following labs and imaging studies  LABORATORY DATA:  Recent Labs  Lab 01/18/23 0345 01/19/23 0200 01/20/23 0401 01/21/23 0426 01/23/23 1218 01/24/23 0341  WBC 9.5 10.3 5.6 10.8*  --  11.4*  HGB 9.5* 8.3* 8.3* 9.1* 9.5* 8.8*  HCT 30.3*  25.2* 25.5* 27.5* 28.0* 28.0*  PLT 293 261 262 300  --  307  MCV 90.7 89.4 87.6 87.9  --  91.5  MCH 28.4 29.4 28.5 29.1  --  28.8  MCHC 31.4 32.9 32.5 33.1  --  31.4  RDW 14.5 14.3 14.0 14.1  --  14.3    Recent Labs  Lab 01/18/23 0345 01/19/23 0200 01/20/23 0401 01/21/23 0426 01/22/23 0438 01/23/23 0224 01/23/23 1218 01/24/23 0341  NA 128*   < > 125* 124* 126* 127* 132* 129*  K 3.9   < > 5.2* 4.6 5.1 5.1 4.1 4.7  CL 99   < > 88* 85* 89* 92*  --  94*  CO2 20*   < > 24 28 25 26   --  25  ANIONGAP 9   < > 13 11 12 9   --  10  GLUCOSE 173*   < > 217* 131* 301* 198*  --  187*  BUN 12   < > 22 24* 34* 34*  --  28*  CREATININE 0.92   < > 1.07 1.26* 1.31* 1.18  --  1.12  AST 29  --   --   --   --   --   --   --   ALT 19  --   --   --   --   --   --   --   ALKPHOS 47  --   --   --   --   --   --   --   BILITOT 0.2*  --   --   --   --   --   --   --   ALBUMIN 2.9*  --   --   --   --   --   --   --   CRP  --   --   --   --   --   --   --  2.6*  PROCALCITON  --   --   --   --   --   --   --  0.31  BNP  --   --   --   --   --   --   --  264.5*  MG  --   --   --   --   --   --   --  1.8  CALCIUM 8.3*   < > 9.3 9.1 9.0 9.0  --  8.9   < > = values in this interval not displayed.    Lab Results  Component Value Date   CHOL 127 01/15/2023   HDL 28 (L) 01/15/2023   LDLCALC 63 01/15/2023   TRIG 179 (H) 01/15/2023   CHOLHDL 4.5 01/15/2023      Recent Labs  Lab 01/20/23 0401 01/21/23 0426 01/22/23 0438 01/23/23 0224 01/24/23 0341  CRP  --   --   --   --  2.6*  PROCALCITON  --   --   --   --  0.31  BNP  --   --   --   --  264.5*  MG  --   --   --   --  1.8  CALCIUM 9.3 9.1 9.0 9.0 8.9       MICROBIOLOGY: No results found for this or any previous visit (from the past 240 hour(s)).  RADIOLOGY STUDIES/RESULTS: No results found.   LOS: 9 days   Signature  -    Susa Raring M.D on 01/24/2023 at 9:48 AM   -  To page go to [email or web address]

## 2023-01-24 NOTE — Progress Notes (Signed)
[facility] [place]   72 year old male with COPD, coronary artery disease, diabetes, hypertension, hyperlipidemia, PAD admitted with bedbugs and defecating on himself.  Vascular surgery was called yesterday given concern for ischemic changes to the right foot.  He also has a right lateral heel ulcer.   He is well-known to vascular surgery and has undergone multiple interventions by Dr. Myra Gianotti as noted below.   Right SFA stent 09/22/2016 Right PT atherectomy 12/08/2016 Angioplasty of right common femoral artery and SFA 04/20/2017 Right PT angioplasty 03/31/2022 Right popliteal artery stent with right PT and peroneal angioplasty on 09/29/2022  Objective (!) 146/68 92 98.6 F (37 C) (Oral) 14 96%  Intake/Output Summary (Last 24 hours) at 01/24/2023 1124 Last data filed at 01/24/2023 0459 Gross per 24 hour  Intake 1204.56 ml  Output 1500 ml  Net -295.44 ml      Laboratory Lab Results: Recent Labs    01/23/23 1218 01/24/23 0341  WBC  --  11.4*  HGB 9.5* 8.8*  HCT 28.0* 28.0*  PLT  --  307   BMET Recent Labs    01/23/23 0224 01/23/23 1218 01/24/23 0341  NA 127* 132* 129*  K 5.1 4.1 4.7  CL 92*  --  94*  CO2 26  --  25  GLUCOSE 198*  --  187*  BUN 34*  --  28*  CREATININE 1.18  --  1.12  CALCIUM 9.0  --  8.9    COAG Lab Results  Component Value Date   INR 1.2 01/14/2023   INR 1.5 10/26/2008   INR 1.1 10/24/2008   No results found for: "PTT"  Assessment/Planning:  Complaining of right foot pain since admission on 01/14/23.  Now with noted color change and exam consistent with Cli with tissue loss.  On exam he has a palpable femoral pulse with a multiphasic popliteal signal behind the right knee.  I do not get any tibial signals in the foot.  I suspect he has occluded his previous tibial intervention for CLI with tissue loss.  I discussed with the hospitalist holding his Eliquis and transitioning to heparin.  I have posted him for angiogram of  the right lower extremity on Tuesday with Dr. Myra Gianotti.  He is very high risk for limb loss which I discussed with him today.  States he is amendable to amputation if that gets rid of his pain.  Cephus Shelling 01/24/2023 11:24 AM --

## 2023-01-25 DIAGNOSIS — E8729 Other acidosis: Secondary | ICD-10-CM | POA: Diagnosis not present

## 2023-01-25 LAB — GLUCOSE, CAPILLARY
Glucose-Capillary: 212 mg/dL — ABNORMAL HIGH (ref 70–99)
Glucose-Capillary: 313 mg/dL — ABNORMAL HIGH (ref 70–99)
Glucose-Capillary: 106 mg/dL — ABNORMAL HIGH (ref 70–99)
Glucose-Capillary: 245 mg/dL — ABNORMAL HIGH (ref 70–99)

## 2023-01-25 LAB — BASIC METABOLIC PANEL
Anion gap: 9 (ref 5–15)
BUN: 22 mg/dL (ref 8–23)
CO2: 24 mmol/L (ref 22–32)
Calcium: 9.2 mg/dL (ref 8.9–10.3)
Chloride: 94 mmol/L — ABNORMAL LOW (ref 98–111)
Creatinine, Ser: 1.19 mg/dL (ref 0.61–1.24)
GFR, Estimated: >60 mL/min (ref >60)
Glucose, Bld: 181 mg/dL — ABNORMAL HIGH (ref 70–99)
Potassium: 4.8 mmol/L (ref 3.5–5.1)
Sodium: 127 mmol/L — ABNORMAL LOW (ref 135–145)

## 2023-01-25 LAB — CBC
HCT: 28.2 % — ABNORMAL LOW (ref 39.0–52.0)
Hemoglobin: 8.9 g/dL — ABNORMAL LOW (ref 13.0–17.0)
MCH: 28.1 pg (ref 26.0–34.0)
MCHC: 31.6 g/dL (ref 30.0–36.0)
MCV: 89 fL (ref 80.0–100.0)
Platelets: 329 10*3/uL (ref 150–400)
RBC: 3.17 MIL/uL — ABNORMAL LOW (ref 4.22–5.81)
RDW: 14.2 % (ref 11.5–15.5)
WBC: 12.7 10*3/uL — ABNORMAL HIGH (ref 4.0–10.5)
nRBC: 0 % (ref 0.0–0.2)

## 2023-01-25 LAB — MAGNESIUM: Magnesium: 1.6 mg/dL — ABNORMAL LOW (ref 1.7–2.4)

## 2023-01-25 LAB — HEPARIN LEVEL (UNFRACTIONATED): Heparin Unfractionated: >1.1 IU/mL — ABNORMAL HIGH (ref 0.30–0.70)

## 2023-01-25 LAB — C-REACTIVE PROTEIN: CRP: 3.6 mg/dL — ABNORMAL HIGH (ref <1.0)

## 2023-01-25 LAB — APTT: aPTT: 85 seconds — ABNORMAL HIGH (ref 24–36)

## 2023-01-25 LAB — PROCALCITONIN: Procalcitonin: 0.16 ng/mL

## 2023-01-25 LAB — BRAIN NATRIURETIC PEPTIDE: B Natriuretic Peptide: 329 pg/mL — ABNORMAL HIGH (ref 0.0–100.0)

## 2023-01-25 MED ORDER — BISACODYL 5 MG PO TBEC
10.0000 mg | DELAYED_RELEASE_TABLET | Freq: Every day | ORAL | Status: DC | PRN
  Administered 2023-01-25 – 2023-01-30 (×2): 10 mg via ORAL
  Filled 2023-01-25 (×2): qty 2

## 2023-01-25 MED ORDER — HYDROCODONE-ACETAMINOPHEN 7.5-325 MG PO TABS
1.0000 | ORAL_TABLET | Freq: Four times a day (QID) | ORAL | Status: DC | PRN
  Administered 2023-01-25 – 2023-01-28 (×8): 1 via ORAL
  Filled 2023-01-25 (×8): qty 1

## 2023-01-25 MED ORDER — FUROSEMIDE 10 MG/ML IJ SOLN
20.0000 mg | Freq: Once | INTRAMUSCULAR | Status: AC
  Administered 2023-01-25: 20 mg via INTRAVENOUS
  Filled 2023-01-25: qty 2

## 2023-01-25 NOTE — Progress Notes (Signed)
Physical Therapy Treatment Patient Details Name: Reginald Ayers MRN: [id number] DOB: 07-27-1951 Today's Date: 01/25/2023   History of Present Illness 72 y/o M admitted to [facility] on 5/23 for weakness and pain in his RLE, pt found in fecal matter and bedbugs. MRI of brain with no acute abnormality, chronically occluded left ICA. CT of abdomen/pelvis with mild groundglass denisty in the RLL, distended gallbladder without calcified stone, dilated CBD with suspicion for intrahepatic dilatation. PMHx: COPD, CAD s/p CABG, DM, HTN, HLD, PAD s/p stent, tobacco dependence.    PT Comments    Pt received in bed, reports he may have surgery tomorrow and amputation is a possibility. Focused session on transfers without using RLE. Pt performed lateral scoot transfer to L with mod A simulating a w/c transfer. Pt then did SPT  from recliner to [facility] with mod A. Discussed pt's ability to mobilize even if an amputation is necessary and answered questions. PT will continue to follow.    Recommendations for follow up therapy are one component of a multi-disciplinary discharge planning process, led by the attending physician.  Recommendations may be updated based on patient status, additional functional criteria and insurance authorization.  Follow Up Recommendations  Can patient physically be transported by private vehicle: Yes    Assistance Recommended at Discharge Frequent or constant Supervision/Assistance  Patient can return home with the following A lot of help with walking and/or transfers;Assistance with cooking/housework;Assist for transportation;Help with stairs or ramp for entrance;A lot of help with bathing/dressing/bathroom   Equipment Recommendations  BSC/3in1;Rolling walker (2 wheels)    Recommendations for Other Services       Precautions / Restrictions Precautions Precautions: Fall;Other (comment) Precaution Comments: bed bugs Restrictions Weight Bearing Restrictions: No     Mobility  Bed  Mobility Overal bed mobility: Needs Assistance Bed Mobility: Supine to Sit     Supine to sit: HOB elevated, Min assist     General bed mobility comments: increased time needed and stabilization at knees until he could scoot fwd and get L foot on floor    Transfers Overall transfer level: Needs assistance Equipment used: None Transfers: Sit to/from Stand, Bed to chair/wheelchair/BSC Sit to Stand: Min assist Stand pivot transfers: Mod assist        Lateral/Scoot Transfers: Mod assist General transfer comment: practiced lateral scoot transfer without using RLE to show him that he would be able to transfer into a chair/ w/c if he does end up having the amputation. Once in chair pt needed to have a BM. SPT from recliner to [facility] with PT front facing to pt and mod A.    Ambulation/Gait               General Gait Details: focused on transfers this session as pt may have sx tomorrow   Stairs             Wheelchair Mobility    Modified Rankin (Stroke Patients Only)       Balance Overall balance assessment: Needs assistance Sitting-balance support: Feet supported, No upper extremity supported Sitting balance-Leahy Scale: Fair   Postural control: Posterior lean (when fatigued) Standing balance support: Bilateral upper extremity supported, During functional activity Standing balance-Leahy Scale: Poor Standing balance comment: needs mod A to maintain standing                            Cognition Arousal/Alertness: Awake/alert Behavior During Therapy: WFL for tasks assessed/performed Overall Cognitive Status: No  family/caregiver present to determine baseline cognitive functioning                                 General Comments: A&Ox4, follows 1 step commands. can be verbose/distractable. Very HOH        Exercises      General Comments General comments (skin integrity, edema, etc.): VSS on RA      Pertinent Vitals/Pain Pain  Assessment Pain Assessment: Faces Faces Pain Scale: Hurts even more Breathing: normal Body Language: relaxed Pain Location: RLE with WB Pain Descriptors / Indicators: Sore Pain Intervention(s): Limited activity within patient's tolerance, Monitored during session    Home Living                          Prior Function            PT Goals (current goals can now be found in the care plan section) Acute Rehab PT Goals Patient Stated Goal: home PT Goal Formulation: With patient Time For Goal Achievement: 01/29/23 Potential to Achieve Goals: Fair Progress towards PT goals: Progressing toward goals    Frequency    Min 3X/week      PT Plan Current plan remains appropriate    Co-evaluation              AM-PAC PT "6 Clicks" Mobility   Outcome Measure  Help needed turning from your back to your side while in a flat bed without using bedrails?: A Little Help needed moving from lying on your back to sitting on the side of a flat bed without using bedrails?: A Little Help needed moving to and from a bed to a chair (including a wheelchair)?: A Little Help needed standing up from a chair using your arms (e.g., wheelchair or bedside chair)?: A Little Help needed to walk in hospital room?: A Little Help needed climbing 3-5 steps with a railing? : Total 6 Click Score: 16    End of Session Equipment Utilized During Treatment: Gait belt Activity Tolerance: Patient tolerated treatment well Patient left: with call bell/phone within reach;Other (comment) [facility]) Nurse Communication: Mobility status;Other (comment) (pt on BSC) PT Visit Diagnosis: Unsteadiness on feet (R26.81);Muscle weakness (generalized) (M62.81);History of falling (Z91.81);Difficulty in walking, not elsewhere classified (R26.2)     Time: 5366-4403 PT Time Calculation (min) (ACUTE ONLY): 26 min  Charges:  $Therapeutic Activity: 23-37 mins                     Lyanne Co, PT  Acute Rehab  Services Secure chat preferred Office [phone]    Reginald Ayers 01/25/2023, 2:11 PM

## 2023-01-25 NOTE — Progress Notes (Addendum)
[facility] [place]  Subjective  - The right foot hurts pretty bad this am   Objective (!) 154/68 91 97.9 F (36.6 C) (Oral) 13 98%  Intake/Output Summary (Last 24 hours) at 01/25/2023 0704 Last data filed at 01/25/2023 0500 Gross per 24 hour  Intake 240 ml  Output 1500 ml  Net -1260 ml   Ischemic right foot changes, minimal toe motor intact, no pedal signals Lungs non labored breathing    Assessment/Planning: Right LE PAD with ischemic skin changes  Plan for angiogram with possible intervention 01/26/23 with DR. Brabham He understands he is at high risk of amputation Eliquis is on hold NPO past MN  Mosetta Pigeon 01/25/2023 7:04 AM --  Laboratory Lab Results: Recent Labs    01/24/23 0341 01/25/23 0600  WBC 11.4* 12.7*  HGB 8.8* 8.9*  HCT 28.0* 28.2*  PLT 307 329   BMET Recent Labs    01/23/23 0224 01/23/23 1218 01/24/23 0341  NA 127* 132* 129*  K 5.1 4.1 4.7  CL 92*  --  94*  CO2 26  --  25  GLUCOSE 198*  --  187*  BUN 34*  --  28*  CREATININE 1.18  --  1.12  CALCIUM 9.0  --  8.9    COAG Lab Results  Component Value Date   INR 1.2 01/14/2023   INR 1.5 10/26/2008   INR 1.1 10/24/2008   No results found for: "PTT"  I have seen and evaluated the patient. I agree with the PA note as documented above. Complaining of right foot pain since admission on 01/14/23.  Now with noted color change and exam consistent with Cli with tissue loss.  On exam he has a palpable femoral pulse with a multiphasic popliteal signal behind the right knee.  I do not get any tibial signals in the foot.  I suspect he has occluded his previous tibial intervention for CLI with tissue loss.  Continue heparin.  I have posted him for angiogram of the right lower extremity tomorrow with Dr. Myra Gianotti.  He is very high risk for limb loss which I discussed with him today.  States he is amendable to amputation if that gets rid of his pain. Consent order placed.   Please keep n.p.o. after midnight.  Cephus Shelling, MD [facility] [place] Office: [phone]

## 2023-01-25 NOTE — Progress Notes (Signed)
PROGRESS NOTE        PATIENT DETAILS Name: Reginald Ayers Age: 72 y.o. Sex: male Date of Birth: April 03, 1951 Admit Date: 01/14/2023 Admitting Physician Jonah Blue, MD ZOX:[id number], Selena Batten, NP  Brief Summary: Patient is a 72 y.o.  male COPD, CAD s/p CABG, PAD s/p stent, HLD, HTN-who presented with weakness.  Per H&P-patient was found at home with bedbugs, and covered in fecal matter.   Significant events: 5/23>> admit to [facility]-weakness/nausea/vomiting-found covered in fecal matter/bedbugs. 5/24>> MRCP with choledocholithiasis 5/28>> ERCP-sphincterotomy 5/29>> evaluation by cardiology-high risk for cholecystectomy 5/30>> General surgery signed off-no plans to pursue cholecystectomy. 6/01>> right foot toes-purple-right foot is cold.  Vascular surgery consulted.  Significant studies: 5/23>> MRI brain: No acute abnormality-chronically occluded left ICA 5/23>> CT abdomen/pelvis: Mild groundglass density in the right lower lobe, distended gallbladder  without calcified stone, dilated CBD measuring up to 19 mm with suspicion for intrahepatic dilatation. 5/24>> MRCP:Severe intrahepatic/extrahepatic biliary ductal dilatation-tiny gallstones/sludge within the cystic duct near the ampulla.  Significant microbiology data: None  Procedures: 5/28>> ERCP-sphincterotomy  Consults: GI General surgery Cardiology Vascular surgery  Subjective:   Patient in bed, appears comfortable, denies any headache, no fever, no chest pain or pressure, no shortness of breath , no abdominal pain. No focal weakness.  Extremity pain has gotten worse.   Objective: Vitals: Blood pressure (!) 165/80, pulse (!) 102, temperature 97.9 F (36.6 C), temperature source Oral, resp. rate (!) 25, height 5\' 1"  (1.549 m), weight 43.5 kg, SpO2 96 %.   Exam:  Awake Alert, No new F.N deficits, Normal affect [place].AT,PERRAL Supple Neck, No JVD,   Symmetrical Chest wall movement, Good air movement  bilaterally, CTAB RRR,No Gallops, Rubs or new Murmurs,  +ve B.Sounds, Abd Soft, No tenderness,   Right lower extremity all toes cyanotic, midfoot cyanotic, also some cyanotic blotches up to his mid leg,    Assessment/Plan:  Weakness/nausea/vomiting - Dilated bile ducts secondary to choledocholithiasis -  Poor historian-unclear etiology-given MRCP findings-concerned that his clinical presentation on admission could have been due to a passed CBD stone/symptomatic choledocholithiasis. He is s/p ERCP on 5/28-not felt to be a good candidate for cholecystectomy given high risk profile. Nausea/vomiting has resolved-he is tolerating regular diet.  Anion gap metabolic acidosis  - Likely starvation ketoacidosis-due to poor intake/possible vomiting-in the setting of possible passed CBD stone. Improving with supportive care  PAD, Right foot ischemic foot - Has had numerous procedures in the past-follow with Dr. Myra Gianotti, Prior to this hospitalization he was on aspirin/Plavix/Eliquis - [facility] following, continue IV heparin and Plavix, per vascular surgery this is likely due to his chronic PAD getting worse, likely will require amputation, continue pain control medications adjusted on 01/25/2023.  He is agreeable for blood transfusion during the perioperative.  History of CVA No obvious deficits on exam-MRI brain negative Continue statin See below regarding antiplatelets.  PAF Sinus rhythm Eliquis was initially held-subsequently placed on IV heparin-since no cholecystectomy was planned-transition back to Eliquis-he unfortunately now has developed ischemic changes in the right foot-stopping Eliquis and changing back to IV heparin.    AKI Mild Improved with supportive care.   Hyponatremia Mild Asymptomatic Seems to be slowly improving with gentle IV fluid hydration.  HTN BP better controlled with amlodipine/metoprolol.  CAD-s/p CABG No anginal symptoms See below regarding antiplatelets. Continue  statin/beta-blocker/antiplatelets  Acute toxic metabolic encephalopathy Somewhat hard to  arouse this morning-but much more easily aroused this afternoon.  ABG stable-nonfocal exam. Suspect this may be due to gabapentin-will discontinue and monitor closely.  COPD Stable Bronchodilators  Hyponatremia, osmolality, urine sodium and serum uric acid suggest towards SIADH, fluid restriction, Lasix on 01/25/2023, if fails then Samsca on 01/26/2023.    Debility/deconditioning Failure to thrive Secondary to acute illness-frailty at baseline [facility] refuses SNF-wants to go home with home health.  Understands risk of rehospitalization/decompensation.  Claims lives with 2 roommates-and has 7 dogs that he misses.  DM-2 CBGs on the higher side-but due to lingering encephalopathy today-will not adjust insulin doses-as oral intake is poor Continue Semglee 14 units daily, 4 units of NovoLog with send SSI. Watch closely-he is very frail-and is not a candidate for aggressive glycemic control.    Recent Labs    01/24/23 1224 01/24/23 1653 01/24/23 2040  GLUCAP 220* 88 199*    Code status:   Code Status: DNR   DVT Prophylaxis: IV heparin gtt   Family Communication: Niece-[name] [phone]-left VM on 5/25, 5/26,5/27   Disposition Plan: Status is: Observation The patient will require care spanning > 2 midnights and should be moved to inpatient because: Severity of illness.   Planned Discharge Destination:Home health   Diet: Diet Order             Diet NPO time specified Except for: Sips with Meds  Diet effective midnight           Diet Heart Room service appropriate? No; Fluid consistency: Thin  Diet effective now                     Antimicrobial agents: Anti-infectives (From admission, onward)    Start     Dose/Rate Route Frequency Ordered Stop   01/19/23 1130  ciprofloxacin (CIPRO) IVPB 400 mg        400 mg 200 mL/hr over 60 Minutes Intravenous  Once 01/19/23 1037  01/19/23 1230   01/14/23 1745  cefTRIAXone (ROCEPHIN) 1 g in sodium chloride 0.9 % 100 mL IVPB        1 g 200 mL/hr over 30 Minutes Intravenous  Once 01/14/23 1741 01/14/23 1922   01/14/23 1745  azithromycin (ZITHROMAX) 500 mg in sodium chloride 0.9 % 250 mL IVPB        500 mg 250 mL/hr over 60 Minutes Intravenous  Once 01/14/23 1741 01/14/23 2315        MEDICATIONS: Scheduled Meds:   stroke: early stages of recovery book   Does not apply Once   amLODipine  5 mg Oral Daily   clopidogrel  75 mg Oral Daily   feeding supplement  237 mL Oral TID BM   insulin aspart  0-15 Units Subcutaneous TID WC   insulin aspart  0-5 Units Subcutaneous QHS   insulin aspart  4 Units Subcutaneous TID WC   insulin glargine-yfgn  14 Units Subcutaneous Daily   metoprolol succinate  25 mg Oral Daily   multivitamin with minerals  1 tablet Oral Daily   pantoprazole  40 mg Oral Daily   polyethylene glycol  17 g Oral Daily   rosuvastatin  20 mg Oral Daily   umeclidinium bromide  1 puff Inhalation QHS   Continuous Infusions:  heparin 1,000 Units/hr (01/24/23 2048)   PRN Meds:.acetaminophen **OR** acetaminophen (TYLENOL) oral liquid 160 mg/5 mL **OR** acetaminophen, albuterol, alum & mag hydroxide-simeth, bisacodyl, hydrALAZINE, HYDROcodone-acetaminophen, ondansetron (ZOFRAN) IV, traMADol   I have personally reviewed following labs and imaging  studies  LABORATORY DATA:  Recent Labs  Lab 01/19/23 0200 01/20/23 0401 01/21/23 0426 01/23/23 1218 01/24/23 0341 01/25/23 0600  WBC 10.3 5.6 10.8*  --  11.4* 12.7*  HGB 8.3* 8.3* 9.1* 9.5* 8.8* 8.9*  HCT 25.2* 25.5* 27.5* 28.0* 28.0* 28.2*  PLT 261 262 300  --  307 329  MCV 89.4 87.6 87.9  --  91.5 89.0  MCH 29.4 28.5 29.1  --  28.8 28.1  MCHC 32.9 32.5 33.1  --  31.4 31.6  RDW 14.3 14.0 14.1  --  14.3 14.2    Recent Labs  Lab 01/21/23 0426 01/22/23 0438 01/23/23 0224 01/23/23 1218 01/24/23 0341 01/25/23 0600  NA 124* 126* 127* 132* 129* 127*   K 4.6 5.1 5.1 4.1 4.7 4.8  CL 85* 89* 92*  --  94* 94*  CO2 28 25 26   --  25 24  ANIONGAP 11 12 9   --  10 9  GLUCOSE 131* 301* 198*  --  187* 181*  BUN 24* 34* 34*  --  28* 22  CREATININE 1.26* 1.31* 1.18  --  1.12 1.19  CRP  --   --   --   --  2.6* 3.6*  PROCALCITON  --   --   --   --  0.31  --   BNP  --   --   --   --  264.5* 329.0*  MG  --   --   --   --  1.8 1.6*  CALCIUM 9.1 9.0 9.0  --  8.9 9.2    Lab Results  Component Value Date   CHOL 127 01/15/2023   HDL 28 (L) 01/15/2023   LDLCALC 63 01/15/2023   TRIG 179 (H) 01/15/2023   CHOLHDL 4.5 01/15/2023      Recent Labs  Lab 01/21/23 0426 01/22/23 0438 01/23/23 0224 01/24/23 0341 01/25/23 0600  CRP  --   --   --  2.6* 3.6*  PROCALCITON  --   --   --  0.31  --   BNP  --   --   --  264.5* 329.0*  MG  --   --   --  1.8 1.6*  CALCIUM 9.1 9.0 9.0 8.9 9.2   MICROBIOLOGY: No results found for this or any previous visit (from the past 240 hour(s)).  RADIOLOGY STUDIES/RESULTS: No results found.   LOS: 10 days   Signature  -    Susa Raring M.D on 01/25/2023 at 8:10 AM   -  To page go to [email or web address]

## 2023-01-25 NOTE — Care Management Important Message (Signed)
Important Message  Patient Details  Name: Reginald Ayers MRN: [id number] Date of Birth: 1951/03/17   Medicare Important Message Given:  Yes     Dorena Bodo 01/25/2023, 3:24 PM

## 2023-01-25 NOTE — H&P (View-Only) (Signed)
[facility]  Subjective  - The right foot hurts pretty bad this am   Objective (!) 154/68 91 97.9 F (36.6 C) (Oral) 13 98%  Intake/Output Summary (Last 24 hours) at 01/25/2023 0704 Last data filed at 01/25/2023 0500 Gross per 24 hour  Intake 240 ml  Output 1500 ml  Net -1260 ml   Ischemic right foot changes, minimal toe motor intact, no pedal signals Lungs non labored breathing    Assessment/Planning: Right LE PAD with ischemic skin changes  Plan for angiogram with possible intervention 01/26/23 with DR. Brabham He understands he is at high risk of amputation Eliquis is on hold NPO past MN  Reginald Ayers 01/25/2023 7:04 AM --  Laboratory Lab Results: Recent Labs    01/24/23 0341 01/25/23 0600  WBC 11.4* 12.7*  HGB 8.8* 8.9*  HCT 28.0* 28.2*  PLT 307 329   BMET Recent Labs    01/23/23 0224 01/23/23 1218 01/24/23 0341  NA 127* 132* 129*  K 5.1 4.1 4.7  CL 92*  --  94*  CO2 26  --  25  GLUCOSE 198*  --  187*  BUN 34*  --  28*  CREATININE 1.18  --  1.12  CALCIUM 9.0  --  8.9    COAG Lab Results  Component Value Date   INR 1.2 01/14/2023   INR 1.5 10/26/2008   INR 1.1 10/24/2008   No results found for: "PTT"  I have seen and evaluated the patient. I agree with the PA note as documented above. Complaining of right foot pain since admission on 01/14/23.  Now with noted color change and exam consistent with Cli with tissue loss.  On exam he has a palpable femoral pulse with a multiphasic popliteal signal behind the right knee.  I do not get any tibial signals in the foot.  I suspect he has occluded his previous tibial intervention for CLI with tissue loss.  Continue heparin.  I have posted him for angiogram of the right lower extremity tomorrow with Dr. Brabham.  He is very high risk for limb loss which I discussed with him today.  States he is amendable to amputation if that gets rid of his pain. Consent order placed.   Please keep n.p.o. after midnight.  Reginald Ayers J. Reginald Lietzke, MD [facility] Office: [phone]   

## 2023-01-25 NOTE — TOC Progression Note (Signed)
Transition of Care [facility]) - Progression Note    Patient Details  Name: Reginald Ayers MRN: [id number] Date of Birth: Jul 02, 1951  Transition of [facility]) CM/SW Contact  Mearl Latin, LCSW Phone Number: 01/25/2023, 6:23 PM  Clinical Narrative:    TOC continuing to follow.   Expected Discharge Plan: Home w Home Health Services Barriers to Discharge: Continued Medical Work up  Expected Discharge Plan and Services In-house Referral: Clinical Social Work   Post Acute Care Choice: Home Health Living arrangements for the past 2 months: Boarding House                                       Social Determinants of Health (SDOH) Interventions SDOH Screenings   Housing: Medium Risk (01/15/2023)  Tobacco Use: High Risk (01/24/2023)    Readmission Risk Interventions    01/12/2022   10:29 AM  Readmission Risk Prevention Plan  Transportation Screening Complete  PCP or Specialist Appt within 3-5 Days Complete  HRI or Home Care Consult Complete  Social Work Consult for Recovery Care Planning/Counseling Complete  Palliative Care Screening Not Applicable  Medication Review [employer]) Complete

## 2023-01-25 NOTE — Progress Notes (Signed)
ANTICOAGULATION CONSULT NOTE  Pharmacy Consult for heparin Indication: atrial fibrillation  Allergies  Allergen Reactions   Actos [Pioglitazone] Shortness Of Breath    Leg swelling    Lipitor [Atorvastatin] Other (See Comments)    Leg pain    Zestril [Lisinopril] Cough    Patient Measurements: Height: 5\' 1"  (154.9 cm) Weight: 43.5 kg (95 lb 14.4 oz) IBW/kg (Calculated) : 52.3 Heparin Dosing Weight: 43.5 kg  Vital Signs: Temp: 97.8 F (36.6 C) (06/03 1200) Temp Source: Oral (06/03 1200) BP: 133/70 (06/03 1200) Pulse Rate: 84 (06/03 1200)  Labs: Recent Labs    01/23/23 0224 01/23/23 1218 01/23/23 1218 01/23/23 1805 01/24/23 0341 01/24/23 1213 01/24/23 2127 01/25/23 0600  HGB  --  9.5*   < >  --  8.8*  --   --  8.9*  HCT  --  28.0*  --   --  28.0*  --   --  28.2*  PLT  --   --   --   --  307  --   --  329  APTT  --   --    < > 39* 53* 50* 76* 85*  HEPARINUNFRC  --   --   --  >1.10* >1.10*  --   --  >1.10*  CREATININE 1.18  --   --   --  1.12  --   --  1.19   < > = values in this interval not displayed.     Estimated Creatinine Clearance: 33.5 mL/min (by C-G formula based on SCr of 1.19 mg/dL).   Medical History: Past Medical History:  Diagnosis Date   Carotid artery disease (HCC)    L-ICA 100%, mod R-ICA dz   COPD (chronic obstructive pulmonary disease) (HCC)    Coronary artery disease    s/p CABG February 2010 by Dr. Andrey Spearman   Diabetes [facility])    Hyperlipidemia    Hypertension    Peripheral arterial disease (HCC)    post left common iliac and right SFA stenting remotely   Tobacco abuse         Medications:  Eliquis 2.5mg  BID PTA, last dose unknown  Restarted Eliquis 5mg  BID inpatient from 5/30 - 5/31 (4 doses)   Assessment: Reginald Ayers is a 72 year old male with a history of Afib on Eliquis prior to admission. He was restarted on Eliquis after being deemed not a candidate for cholecystectomy given high risk profile. His last dose of eliquis  was 01/22/2023 PM. Heparin now to be restarted given concern for ischemic foot. Will start heparin 12 hours after last eliquis dose and avoid bolus given recent use of therapeutic anticoagulation. Will measure aPTT and anti-Xa until correlating.   aPTT continue to be therapeutic. Plan for amputation in AM.  Goal of Therapy:  Heparin level 0.3-0.7 units/ml aPTT 66-102 seconds Monitor platelets by anticoagulation protocol: Yes   Plan:  Cont heparin 1000 units/h Daily aPTT and heparin level  Monitor for signs and symptoms of bleeding and CBC F/u AC post procedure  Ulyses Southward, PharmD, BCIDP, AAHIVP, CPP Infectious Disease Pharmacist 01/25/2023 2:22 PM

## 2023-01-26 ENCOUNTER — Encounter (HOSPITAL_COMMUNITY)
Admission: EM | Disposition: A | Discharge status: Hospice | Payer: Self-pay | Source: Home / Self Care | Attending: Internal Medicine

## 2023-01-26 ENCOUNTER — Ambulatory Visit (HOSPITAL_COMMUNITY): Admission: RE | Admit: 2023-01-26 | Payer: Medicare Other | Source: Home / Self Care | Admitting: Surgery

## 2023-01-26 DIAGNOSIS — E8729 Other acidosis: Secondary | ICD-10-CM | POA: Diagnosis not present

## 2023-01-26 LAB — BASIC METABOLIC PANEL
Anion gap: 10 (ref 5–15)
BUN: 28 mg/dL — ABNORMAL HIGH (ref 8–23)
CO2: 26 mmol/L (ref 22–32)
Calcium: 9.2 mg/dL (ref 8.9–10.3)
Chloride: 92 mmol/L — ABNORMAL LOW (ref 98–111)
Creatinine, Ser: 1.26 mg/dL — ABNORMAL HIGH (ref 0.61–1.24)
GFR, Estimated: 60 mL/min — ABNORMAL LOW (ref >60)
Glucose, Bld: 145 mg/dL — ABNORMAL HIGH (ref 70–99)
Potassium: 4.9 mmol/L (ref 3.5–5.1)
Sodium: 128 mmol/L — ABNORMAL LOW (ref 135–145)

## 2023-01-26 LAB — CBC
HCT: 27.4 % — ABNORMAL LOW (ref 39.0–52.0)
Hemoglobin: 8.7 g/dL — ABNORMAL LOW (ref 13.0–17.0)
MCH: 27.9 pg (ref 26.0–34.0)
MCHC: 31.8 g/dL (ref 30.0–36.0)
MCV: 87.8 fL (ref 80.0–100.0)
Platelets: 352 10*3/uL (ref 150–400)
RBC: 3.12 MIL/uL — ABNORMAL LOW (ref 4.22–5.81)
RDW: 14.2 % (ref 11.5–15.5)
WBC: 11.7 10*3/uL — ABNORMAL HIGH (ref 4.0–10.5)
nRBC: 0 % (ref 0.0–0.2)

## 2023-01-26 LAB — PROCALCITONIN: Procalcitonin: 0.41 ng/mL

## 2023-01-26 LAB — MAGNESIUM: Magnesium: 1.8 mg/dL (ref 1.7–2.4)

## 2023-01-26 LAB — HEPARIN LEVEL (UNFRACTIONATED): Heparin Unfractionated: >1.1 IU/mL — ABNORMAL HIGH (ref 0.30–0.70)

## 2023-01-26 LAB — URIC ACID: Uric Acid, Serum: 3 mg/dL — ABNORMAL LOW (ref 3.7–8.6)

## 2023-01-26 LAB — C-REACTIVE PROTEIN: CRP: 5.2 mg/dL — ABNORMAL HIGH (ref <1.0)

## 2023-01-26 LAB — GLUCOSE, CAPILLARY
Glucose-Capillary: 211 mg/dL — ABNORMAL HIGH (ref 70–99)
Glucose-Capillary: 67 mg/dL — ABNORMAL LOW (ref 70–99)
Glucose-Capillary: 117 mg/dL — ABNORMAL HIGH (ref 70–99)
Glucose-Capillary: 117 mg/dL — ABNORMAL HIGH (ref 70–99)

## 2023-01-26 LAB — BRAIN NATRIURETIC PEPTIDE: B Natriuretic Peptide: 223.8 pg/mL — ABNORMAL HIGH (ref 0.0–100.0)

## 2023-01-26 LAB — SODIUM, URINE, RANDOM: Sodium, Ur: 73 mmol/L

## 2023-01-26 LAB — OSMOLALITY: Osmolality: 281 mOsm/kg (ref 275–295)

## 2023-01-26 LAB — OSMOLALITY, URINE: Osmolality, Ur: 446 mOsm/kg (ref 300–900)

## 2023-01-26 LAB — APTT
aPTT: 108 seconds — ABNORMAL HIGH (ref 24–36)
aPTT: 60 seconds — ABNORMAL HIGH (ref 24–36)
aPTT: 79 seconds — ABNORMAL HIGH (ref 24–36)

## 2023-01-26 SURGERY — INVASIVE LAB ABORTED CASE

## 2023-01-26 MED ORDER — LACTATED RINGERS IV SOLN: INTRAVENOUS | Status: DC

## 2023-01-26 MED ORDER — SODIUM CHLORIDE 0.9 % IV SOLN: INTRAVENOUS | Status: DC

## 2023-01-26 MED ORDER — LIDOCAINE HCL (PF) 1 % IJ SOLN
INTRAMUSCULAR | Status: AC
  Filled 2023-01-26: qty 30

## 2023-01-26 SURGICAL SUPPLY — 11 items
CATH OMNI FLUSH 5F 65CM (CATHETERS) IMPLANT
KIT MICROPUNCTURE NIT STIFF (SHEATH) IMPLANT
KIT PV (KITS) ×2 IMPLANT
SHEATH PINNACLE 5F 10CM (SHEATH) IMPLANT
SHEATH PROBE COVER 6X72 (BAG) IMPLANT
STOPCOCK MORSE 400PSI 3WAY (MISCELLANEOUS) IMPLANT
SYR MEDRAD MARK 7 150ML (SYRINGE) ×2 IMPLANT
TRANSDUCER W/STOPCOCK (MISCELLANEOUS) ×2 IMPLANT
TRAY PV CATH (CUSTOM PROCEDURE TRAY) ×2 IMPLANT
TUBING CIL FLEX 10 FLL-RA (TUBING) IMPLANT
WIRE STARTER BENTSON 035X150 (WIRE) IMPLANT

## 2023-01-26 NOTE — Progress Notes (Signed)
ANTICOAGULATION CONSULT NOTE  Pharmacy Consult for heparin Indication: atrial fibrillation  Allergies  Allergen Reactions   Actos [Pioglitazone] Shortness Of Breath    Leg swelling    Lipitor [Atorvastatin] Other (See Comments)    Leg pain    Zestril [Lisinopril] Cough    Patient Measurements: Height: 5\' 1"  (154.9 cm) Weight: 43.5 kg (95 lb 14.4 oz) IBW/kg (Calculated) : 52.3 Heparin Dosing Weight: 43.5 kg  Vital Signs: Temp: 98.6 F (37 C) (06/04 1659) Temp Source: Oral (06/04 1659) BP: 144/64 (06/04 1659) Pulse Rate: 87 (06/04 1659)  Labs: Recent Labs    01/24/23 0341 01/24/23 1213 01/25/23 0600 01/26/23 0233 01/26/23 1728  HGB 8.8*  --  8.9* 8.7*  --   HCT 28.0*  --  28.2* 27.4*  --   PLT 307  --  329 352  --   APTT 53*   < > 85* 108* 60*  HEPARINUNFRC >1.10*  --  >1.10* >1.10*  --   CREATININE 1.12  --  1.19 1.26*  --    < > = values in this interval not displayed.    Estimated Creatinine Clearance: 31.6 mL/min (A) (by C-G formula based on SCr of 1.26 mg/dL (H)).   Medical History: Past Medical History:  Diagnosis Date   Carotid artery disease (HCC)    L-ICA 100%, mod R-ICA dz   COPD (chronic obstructive pulmonary disease) (HCC)    Coronary artery disease    s/p CABG February 2010 by Dr. Andrey Spearman   Diabetes [facility])    Hyperlipidemia    Hypertension    Peripheral arterial disease (HCC)    post left common iliac and right SFA stenting remotely   Tobacco abuse         Medications:  Eliquis 2.5mg  BID PTA, last dose unknown  Restarted Eliquis 5mg  BID inpatient from 5/30 - 5/31 (4 doses)   Assessment: Ramel is a 72 year old male with a history of Afib on Eliquis prior to admission. He was restarted on Eliquis after being deemed not a candidate for cholecystectomy given high risk profile. His last dose of eliquis was 01/22/2023 PM. Heparin now to be restarted given concern for ischemic foot. Will start heparin 12 hours after last eliquis dose  and avoid bolus given recent use of therapeutic anticoagulation. Will measure aPTT and anti-Xa until correlating.   aPTT down to 60 this 17:28PM - Heparin was paused from 1445 PM to 1529PM so does not reflect steady state level.  Angiogram was cancelled.  Plan to go for TKA tomorrow evening.  Per Dr. Myra Gianotti, no need to stop heparin early in preparation for surgery tomorrow- will give orders on call.   Goal of Therapy:  Heparin level 0.3-0.7 units/ml aPTT 66-102 seconds Monitor platelets by anticoagulation protocol: Yes   Plan:  Continue heparin 900 units/hr for now Repeat aPTT at 2200PM.  Daily aPTT and heparin level  Monitor for signs and symptoms of bleeding and CBC  Link Snuffer, PharmD, BCPS, BCCCP Clinical Pharmacist Please refer to [facility] for [facility] Pharmacy numbers 01/26/2023 6:07 PM

## 2023-01-26 NOTE — Progress Notes (Signed)
Patients foot wound cleansed and painted with iodine. Attempted to replace Prevalon boots and patient stated "I don't care where you put them, just not on my feet because they don't work." Provided patient with education regarding the boots however he continued to decline. Patient also stated "I'm gonna head out of here today, I don't care what those doctors say." Will continue to monitor.

## 2023-01-26 NOTE — Progress Notes (Signed)
ANTICOAGULATION CONSULT NOTE  Pharmacy Consult for heparin Indication: atrial fibrillation  Allergies  Allergen Reactions   Actos [Pioglitazone] Shortness Of Breath    Leg swelling    Lipitor [Atorvastatin] Other (See Comments)    Leg pain    Zestril [Lisinopril] Cough    Patient Measurements: Height: 5\' 1"  (154.9 cm) Weight: 43.5 kg (95 lb 14.4 oz) IBW/kg (Calculated) : 52.3 Heparin Dosing Weight: 43.5 kg  Vital Signs: Temp: 100.3 F (37.9 C) (06/04 2000) Temp Source: Oral (06/04 2000) BP: 143/80 (06/04 2000) Pulse Rate: 95 (06/04 2000)  Labs: Recent Labs    01/24/23 0341 01/24/23 1213 01/25/23 0600 01/26/23 0233 01/26/23 1728 01/26/23 2133  HGB 8.8*  --  8.9* 8.7*  --   --   HCT 28.0*  --  28.2* 27.4*  --   --   PLT 307  --  329 352  --   --   APTT 53*   < > 85* 108* 60* 79*  HEPARINUNFRC >1.10*  --  >1.10* >1.10*  --   --   CREATININE 1.12  --  1.19 1.26*  --   --    < > = values in this interval not displayed.     Estimated Creatinine Clearance: 31.6 mL/min (A) (by C-G formula based on SCr of 1.26 mg/dL (H)).   Medical History: Past Medical History:  Diagnosis Date   Carotid artery disease (HCC)    L-ICA 100%, mod R-ICA dz   COPD (chronic obstructive pulmonary disease) (HCC)    Coronary artery disease    s/p CABG February 2010 by Dr. Andrey Spearman   Diabetes [facility])    Hyperlipidemia    Hypertension    Peripheral arterial disease (HCC)    post left common iliac and right SFA stenting remotely   Tobacco abuse         Medications:  Eliquis 2.5mg  BID PTA, last dose unknown  Restarted Eliquis 5mg  BID inpatient from 5/30 - 5/31 (4 doses)   Assessment: Reginald Ayers is a 72 year old male with a history of Afib on Eliquis prior to admission. He was restarted on Eliquis after being deemed not a candidate for cholecystectomy given high risk profile. His last dose of eliquis was 01/22/2023 PM. Heparin now to be restarted given concern for ischemic foot.  Will start heparin 12 hours after last eliquis dose and avoid bolus given recent use of therapeutic anticoagulation. Will measure aPTT and anti-Xa until correlating.   Angiogram cancelled and heparin resumed, repeat aPTT is therapeutic this evening.  Goal of Therapy:  Heparin level 0.3-0.7 units/ml aPTT 66-102 seconds Monitor platelets by anticoagulation protocol: Yes   Plan:  Continue heparin 900 units/hr for now Daily aPTT and heparin level  Monitor for signs and symptoms of bleeding and CBC  Fredonia Highland, PharmD, BCPS, [facility] Clinical Pharmacist [phone] Please check AMION for all [facility] Pharmacy numbers 01/26/2023

## 2023-01-26 NOTE — Progress Notes (Signed)
Occupational Therapy Treatment Patient Details Name: Reginald Ayers MRN: [id number] DOB: 06-05-1951 Today's Date: 01/26/2023   History of present illness 72 y/o M admitted to [facility] on 5/23 for weakness and pain in his RLE, pt found in fecal matter and bedbugs. MRI of brain with no acute abnormality, chronically occluded left ICA. CT of abdomen/pelvis with mild groundglass denisty in the RLL, distended gallbladder without calcified stone, dilated CBD with suspicion for intrahepatic dilatation. PMHx: COPD, CAD s/p CABG, DM, HTN, HLD, PAD s/p stent, tobacco dependence.   OT comments  Pt demonstrating fair progress toward therapy goals. Upon arrival, pt agitated and initially refusing OOB activity secondary to RLE pain. Pt agreeable to standing at bedside with encouragement for linen change and pericare post BM. Pt min A for transfer to EOB and mod A +2 for STS transfer from elevated EOB with RW and max verbal/tactile cues for hand placement. Pt unable to offload RLE in standing due to fear of falling. Pt limited secondary to [facility] and requiring max verbal cues for sequencing tasks and using call bell to request pain medication. Pt would continue to benefit from acute OT services to progress towards therapy goals and facilitate transition to skilled inpatient follow up therapy, <3 hours/day.   Recommendations for follow up therapy are one component of a multi-disciplinary discharge planning process, led by the attending physician.  Recommendations may be updated based on patient status, additional functional criteria and insurance authorization.    Assistance Recommended at Discharge Frequent or constant Supervision/Assistance  Patient can return home with the following  A lot of help with bathing/dressing/bathroom;Assistance with cooking/housework;Direct supervision/assist for medications management;Direct supervision/assist for financial management;Assist for transportation;Help with stairs or ramp for  entrance;A little help with walking and/or transfers   Equipment Recommendations  Other (comment) (defer)    Recommendations for Other Services      Precautions / Restrictions Precautions Precautions: Fall Precaution Comments: bed bugs Restrictions Weight Bearing Restrictions: No       Mobility Bed Mobility Overal bed mobility: Needs Assistance Bed Mobility: Supine to Sit, Sit to Supine     Supine to sit: HOB elevated, Min assist Sit to supine: Max assist   General bed mobility comments: supine>sit with HOB elevated and trunk support. Sit>supine with max A for trunk and BLE management    Transfers Overall transfer level: Needs assistance Equipment used: Rolling walker (2 wheels) Transfers: Sit to/from Stand Sit to Stand: Mod assist, +2 safety/equipment, +2 physical assistance, From elevated surface           General transfer comment: STS transfer from elevated EOB with mod A +2 and max verbal/tactile cues for hand placement on RW     Balance Overall balance assessment: Needs assistance Sitting-balance support: Feet supported, No upper extremity supported Sitting balance-Leahy Scale: Fair Sitting balance - Comments: sitting EOB   Standing balance support: Bilateral upper extremity supported, During functional activity Standing balance-Leahy Scale: Poor Standing balance comment: BUE reliance on RW                           ADL either performed or assessed with clinical judgement   ADL Overall ADL's : Needs assistance/impaired                 Upper Body Dressing : Minimal assistance;Sitting Upper Body Dressing Details (indicate cue type and reason): sitting EOB, assist for threading RUE and bringing gown to shoulders     Toilet Transfer:  Moderate assistance;+2 for physical assistance;+2 for safety/equipment;BSC/3in1;Stand-pivot;Cueing for safety;Cueing for sequencing Toilet Transfer Details (indicate cue type and reason): simulated. Limited  by RLE pain, required max verbal/tactile cues for hand placement Toileting- Clothing Manipulation and Hygiene: Total assistance;+2 for physical assistance;+2 for safety/equipment;Sit to/from stand       Functional mobility during ADLs: Moderate assistance;+2 for physical assistance;+2 for safety/equipment;Rolling walker (2 wheels) General ADL Comments: Limited by RLE pain and cognition. Unable to offload RLE in standing.    Extremity/Trunk Assessment Upper Extremity Assessment Upper Extremity Assessment: Generalized weakness   Lower Extremity Assessment Lower Extremity Assessment: Defer to PT evaluation        Vision   Vision Assessment?: No apparent visual deficits   Perception Perception Perception: Not tested   Praxis Praxis Praxis: Not tested    Cognition Arousal/Alertness: Awake/alert Behavior During Therapy: Agitated Overall Cognitive Status: No family/caregiver present to determine baseline cognitive functioning                                 General Comments: Pt limited secondary to [facility]. Verbose and self-distracting requiring max cues to redirect. Follows simple 1 step commands with max verbal and tactile cues. Perseveratory on RLE pain, required max cues to use call bell to request pain medication.        Exercises      Shoulder Instructions       General Comments VSS on RA, RLE inflammed and necrotic toes    Pertinent Vitals/ Pain       Pain Assessment Pain Assessment: Faces Faces Pain Scale: Hurts whole lot Pain Location: RLE with and without activity Pain Descriptors / Indicators: Discomfort, Grimacing, Moaning Pain Intervention(s): Limited activity within patient's tolerance, Monitored during session, Patient requesting pain meds-RN notified  Home Living                                          Prior Functioning/Environment              Frequency  Min 2X/week        Progress Toward Goals  OT  Goals(current goals can now be found in the care plan section)  Progress towards OT goals: Progressing toward goals  Acute Rehab OT Goals Patient Stated Goal: to get back in bed OT Goal Formulation: With patient Time For Goal Achievement: 01/29/23 Potential to Achieve Goals: Good ADL Goals Pt Will Perform Lower Body Bathing: with min guard assist;sit to/from stand Pt Will Perform Lower Body Dressing: with min guard assist;sit to/from stand Pt Will Transfer to Toilet: with modified independence;regular height toilet;ambulating Pt Will Perform Toileting - Clothing Manipulation and hygiene: with modified independence;sit to/from stand  Plan Discharge plan remains appropriate;Frequency remains appropriate    Co-evaluation                 AM-PAC OT "6 Clicks" Daily Activity     Outcome Measure   Help from another person eating meals?: None Help from another person taking care of personal grooming?: A Little Help from another person toileting, which includes using toliet, bedpan, or urinal?: A Lot Help from another person bathing (including washing, rinsing, drying)?: A Lot Help from another person to put on and taking off regular upper body clothing?: A Little Help from another person to put on and taking off regular lower body  clothing?: A Lot 6 Click Score: 16    End of Session Equipment Utilized During Treatment: Gait belt;Rolling walker (2 wheels)  OT Visit Diagnosis: Unsteadiness on feet (R26.81);Other abnormalities of gait and mobility (R26.89);History of falling (Z91.81);Muscle weakness (generalized) (M62.81);Other (comment)   Activity Tolerance Patient limited by pain   Patient Left in bed;with bed alarm set;with call bell/phone within reach   Nurse Communication Mobility status;Patient requests pain meds        Time: 4098-1191 OT Time Calculation (min): 30 min  Charges: OT General Charges $OT Visit: 1 Visit OT Treatments $Self Care/Home Management : 8-22  mins  Sherley Bounds, OTS Acute Rehabilitation Services Office [phone] Secure Chat Communication Preferred   Sherley Bounds 01/26/2023, 2:34 PM

## 2023-01-26 NOTE — Progress Notes (Signed)
Mobility Specialist Progress Note   01/26/23 1755  Mobility  Activity Transferred from bed to chair  Level of Assistance Moderate assist, patient does 50-74%  Assistive Device Other (Comment) (HHA)  Activity Response Tolerated fair  Mobility Referral Yes  $Mobility charge 1 Mobility  Mobility Specialist Start Time (ACUTE ONLY) 1733  Mobility Specialist Stop Time (ACUTE ONLY) 1755  Mobility Specialist Time Calculation (min) (ACUTE ONLY) 22 min   Received pt in bed having no c/o pain and agreeable. Pt able to get EOB w/ minA to LE's and trunk to get pt upright. Able to laterally scoot pt on L side w/ ModA using pad under pt + pt's UE's to support their trunk. Pt wincing in pain with all movements but pain last momentarily. Left in chair w/ call bell in reach and chair alarm on.   Frederico Hamman Mobility Specialist Please contact via SecureChat or  Rehab office at [phone]

## 2023-01-26 NOTE — Progress Notes (Signed)
The patient is well-known to me, having undergone multiple percutaneous interventions in the past.  He presented to the hospital covered in fecal matter and bedbugs.  Per report, he was having right leg pain and weakness.  Dr. Chestine Spore saw him in consult over the weekend and was concerned about his vascular exam to the right leg.  There were no Doppler signals in the pedal or tibial vessels.  He did have a femoral pulse.  Clinically the leg appears ischemic.  He was scheduled for angiography today.  I evaluated the patient when he was placed onto the Cath Lab table.  He is complaining of significant pain in the right foot.  He does not have motor or sensory function.  The foot is mottled.  He has pain at the level of the knee.  I discussed with the patient that I feel that his leg is not salvageable.  I think that the best course of action is to proceed with a right above-knee amputation.  This should help with pain control.  I do not think that angiography would be beneficial as he would not be a candidate for a below-knee amputation.  I am concerned that if he had something that could be revascularized that because of the ischemic changes to the foot that this could lead to a profound reperfusion issue.  I do not think proceeding would be in his best interest.  I will keep him n.p.o. after midnight and try to contact the family to further discuss this.  [name] appears to have good comprehension of our conversation.  [name]

## 2023-01-26 NOTE — Interval H&P Note (Signed)
History and Physical Interval Note:  01/26/2023 2:03 PM  Reginald Ayers  has presented today for surgery, with the diagnosis of ischemia of right lower extremity.  The various methods of treatment have been discussed with the patient and family. After consideration of risks, benefits and other options for treatment, the patient has consented to  Procedure(s): ABDOMINAL AORTOGRAM W/LOWER EXTREMITY (N/A) as a surgical intervention.  The patient's history has been reviewed, patient examined, no change in status, stable for surgery.  I have reviewed the patient's chart and labs.  Questions were answered to the patient's satisfaction.     [name]

## 2023-01-26 NOTE — Progress Notes (Signed)
PROGRESS NOTE        PATIENT DETAILS Name: Reginald Ayers Age: 72 y.o. Sex: male Date of Birth: June 13, 1951 Admit Date: 01/14/2023 Admitting Physician Jonah Blue, MD ZOX:[id number], Selena Batten, NP  Brief Summary: Patient is a 72 y.o.  male COPD, CAD s/p CABG, PAD s/p stent, HLD, HTN-who presented with weakness.  Per H&P-patient was found at home with bedbugs, and covered in fecal matter.   Significant events: 5/23>> admit to [facility]-weakness/nausea/vomiting-found covered in fecal matter/bedbugs. 5/24>> MRCP with choledocholithiasis 5/28>> ERCP-sphincterotomy 5/29>> evaluation by cardiology-high risk for cholecystectomy 5/30>> General surgery signed off-no plans to pursue cholecystectomy. 6/01>> right foot toes-purple-right foot is cold.  Vascular surgery consulted.  Significant studies: 5/23>> MRI brain: No acute abnormality-chronically occluded left ICA 5/23>> CT abdomen/pelvis: Mild groundglass density in the right lower lobe, distended gallbladder  without calcified stone, dilated CBD measuring up to 19 mm with suspicion for intrahepatic dilatation. 5/24>> MRCP:Severe intrahepatic/extrahepatic biliary ductal dilatation-tiny gallstones/sludge within the cystic duct near the ampulla.  Significant microbiology data: None  Procedures: 5/28>> ERCP-sphincterotomy  Consults: GI General surgery Cardiology Vascular surgery  Subjective:   Patient in bed appears to be in no distress denies any chest abdominal pain or shortness of breath, no headache, no focal weakness, continues to have right foot pain and discomfort.   Objective: Vitals: Blood pressure 137/68, pulse 92, temperature 98.6 F (37 C), temperature source Oral, resp. rate 18, height 5\' 1"  (1.549 m), weight 43.5 kg, SpO2 95 %.   Exam:  Awake Alert, No new F.N deficits, Normal affect [place].AT,PERRAL Supple Neck, No JVD,   Symmetrical Chest wall movement, Good air movement bilaterally, CTAB RRR,No  Gallops, Rubs or new Murmurs,  +ve B.Sounds, Abd Soft, No tenderness,   Right lower extremity all toes cyanotic, midfoot cyanotic, also some cyanotic blotches up to his mid leg,    Assessment/Plan:  Weakness/nausea/vomiting - Dilated bile ducts secondary to choledocholithiasis -  Poor historian-unclear etiology-given MRCP findings-concerned that his clinical presentation on admission could have been due to a passed CBD stone/symptomatic choledocholithiasis. He is s/p ERCP on 5/28-not felt to be a good candidate for cholecystectomy given high risk profile. Nausea/vomiting has resolved-he is tolerating regular diet.  Anion gap metabolic acidosis  - Likely starvation ketoacidosis-due to poor intake/possible vomiting-in the setting of possible passed CBD stone. Improving with supportive care  PAD, Right foot ischemic foot - Has had numerous procedures in the past-follow with Dr. Myra Gianotti, Prior to this hospitalization he was on aspirin/Plavix/Eliquis - VVS following, continue IV heparin and Plavix, per vascular surgery this is likely due to his chronic PAD getting worse, likely will require BKA on 01/26/2023, continue pain control medications adjusted on 01/25/2023.  He is agreeable for blood transfusion during the perioperative.  History of CVA No obvious deficits on exam-MRI brain negative Continue statin See below regarding antiplatelets.  PAF Sinus rhythm Eliquis was initially held-subsequently placed on IV heparin-since no cholecystectomy was planned-transition back to Eliquis-he unfortunately now has developed ischemic changes in the right foot-stopping Eliquis and changing back to IV heparin.    AKI Mild Improved with supportive care.   Hyponatremia Mild Asymptomatic Seems to be slowly improving with gentle IV fluid hydration.  HTN BP better controlled with amlodipine/metoprolol.  CAD-s/p CABG No anginal symptoms See below regarding antiplatelets. Continue  statin/beta-blocker/antiplatelets  Acute toxic metabolic encephalopathy Somewhat hard to arouse this  morning-but much more easily aroused this afternoon.  ABG stable-nonfocal exam. Suspect this may be due to gabapentin-will discontinue and monitor closely.  COPD Stable Bronchodilators  Hyponatremia, osmolality, urine sodium and serum uric acid suggest towards SIADH, failed fluid restriction on 01/25/2023 on 01/26/2023, gentle Lasix repeat, if no improvement Samsca or 01/11/2023.  Debility/deconditioning Failure to thrive Secondary to acute illness-frailty at baseline [facility] refuses SNF-wants to go home with home health.  Understands risk of rehospitalization/decompensation.  Claims lives with 2 roommates-and has 7 dogs that he misses.  DM-2 CBGs on the higher side-but due to lingering encephalopathy today-will not adjust insulin doses-as oral intake is poor Continue Semglee 14 units daily, 4 units of NovoLog with send SSI. Watch closely-he is very frail-and is not a candidate for aggressive glycemic control.    Recent Labs    01/25/23 1630 01/25/23 2136 01/26/23 0833  GLUCAP 106* 245* 211*    Code status:   Code Status: DNR   DVT Prophylaxis: IV heparin gtt   Family Communication: Niece-[name] [phone]-left VM on 5/25, 5/26,5/27   Disposition Plan: Status is: Observation The patient will require care spanning > 2 midnights and should be moved to inpatient because: Severity of illness.   Planned Discharge Destination:Home health   Diet: Diet Order             Diet NPO time specified Except for: Sips with Meds  Diet effective midnight                     Antimicrobial agents: Anti-infectives (From admission, onward)    Start     Dose/Rate Route Frequency Ordered Stop   01/19/23 1130  ciprofloxacin (CIPRO) IVPB 400 mg        400 mg 200 mL/hr over 60 Minutes Intravenous  Once 01/19/23 1037 01/19/23 1230   01/14/23 1745  cefTRIAXone (ROCEPHIN) 1 g in  sodium chloride 0.9 % 100 mL IVPB        1 g 200 mL/hr over 30 Minutes Intravenous  Once 01/14/23 1741 01/14/23 1922   01/14/23 1745  azithromycin (ZITHROMAX) 500 mg in sodium chloride 0.9 % 250 mL IVPB        500 mg 250 mL/hr over 60 Minutes Intravenous  Once 01/14/23 1741 01/14/23 2315        MEDICATIONS: Scheduled Meds:   stroke: early stages of recovery book   Does not apply Once   amLODipine  5 mg Oral Daily   clopidogrel  75 mg Oral Daily   feeding supplement  237 mL Oral TID BM   insulin aspart  0-15 Units Subcutaneous TID WC   insulin aspart  0-5 Units Subcutaneous QHS   insulin aspart  4 Units Subcutaneous TID WC   insulin glargine-yfgn  14 Units Subcutaneous Daily   metoprolol succinate  25 mg Oral Daily   multivitamin with minerals  1 tablet Oral Daily   pantoprazole  40 mg Oral Daily   polyethylene glycol  17 g Oral Daily   rosuvastatin  20 mg Oral Daily   umeclidinium bromide  1 puff Inhalation QHS   Continuous Infusions:  heparin 1,000 Units/hr (01/26/23 0002)   lactated ringers     PRN Meds:.acetaminophen **OR** acetaminophen (TYLENOL) oral liquid 160 mg/5 mL **OR** acetaminophen, albuterol, alum & mag hydroxide-simeth, bisacodyl, hydrALAZINE, HYDROcodone-acetaminophen, ondansetron (ZOFRAN) IV, traMADol   I have personally reviewed following labs and imaging studies  LABORATORY DATA:  Recent Labs  Lab 01/20/23 0401 01/21/23 0426 01/23/23 1218 01/24/23  0341 01/25/23 0600 01/26/23 0233  WBC 5.6 10.8*  --  11.4* 12.7* 11.7*  HGB 8.3* 9.1* 9.5* 8.8* 8.9* 8.7*  HCT 25.5* 27.5* 28.0* 28.0* 28.2* 27.4*  PLT 262 300  --  307 329 352  MCV 87.6 87.9  --  91.5 89.0 87.8  MCH 28.5 29.1  --  28.8 28.1 27.9  MCHC 32.5 33.1  --  31.4 31.6 31.8  RDW 14.0 14.1  --  14.3 14.2 14.2    Recent Labs  Lab 01/22/23 0438 01/23/23 0224 01/23/23 1218 01/24/23 0341 01/25/23 0600 01/26/23 0233  NA 126* 127* 132* 129* 127* 128*  K 5.1 5.1 4.1 4.7 4.8 4.9  CL 89*  92*  --  94* 94* 92*  CO2 25 26  --  25 24 26   ANIONGAP 12 9  --  10 9 10   GLUCOSE 301* 198*  --  187* 181* 145*  BUN 34* 34*  --  28* 22 28*  CREATININE 1.31* 1.18  --  1.12 1.19 1.26*  CRP  --   --   --  2.6* 3.6* 5.2*  PROCALCITON  --   --   --  0.31 0.16 0.41  BNP  --   --   --  264.5* 329.0* 223.8*  MG  --   --   --  1.8 1.6* 1.8  CALCIUM 9.0 9.0  --  8.9 9.2 9.2    Lab Results  Component Value Date   CHOL 127 01/15/2023   HDL 28 (L) 01/15/2023   LDLCALC 63 01/15/2023   TRIG 179 (H) 01/15/2023   CHOLHDL 4.5 01/15/2023      Recent Labs  Lab 01/22/23 0438 01/23/23 0224 01/24/23 0341 01/25/23 0600 01/26/23 0233  CRP  --   --  2.6* 3.6* 5.2*  PROCALCITON  --   --  0.31 0.16 0.41  BNP  --   --  264.5* 329.0* 223.8*  MG  --   --  1.8 1.6* 1.8  CALCIUM 9.0 9.0 8.9 9.2 9.2   MICROBIOLOGY: No results found for this or any previous visit (from the past 240 hour(s)).  RADIOLOGY STUDIES/RESULTS: No results found.   LOS: 11 days   Signature  -    Susa Raring M.D on 01/26/2023 at 8:52 AM   -  To page go to [email or web address]

## 2023-01-26 NOTE — Progress Notes (Addendum)
ANTICOAGULATION CONSULT NOTE  Pharmacy Consult for heparin Indication: atrial fibrillation  Allergies  Allergen Reactions   Actos [Pioglitazone] Shortness Of Breath    Leg swelling    Lipitor [Atorvastatin] Other (See Comments)    Leg pain    Zestril [Lisinopril] Cough    Patient Measurements: Height: 5\' 1"  (154.9 cm) Weight: 43.5 kg (95 lb 14.4 oz) IBW/kg (Calculated) : 52.3 Heparin Dosing Weight: 43.5 kg  Vital Signs: Temp: 98.3 F (36.8 C) (06/04 0335) Temp Source: Oral (06/04 0335) BP: 144/67 (06/04 0335) Pulse Rate: 88 (06/04 0335)  Labs: Recent Labs    01/24/23 0341 01/24/23 1213 01/24/23 2127 01/25/23 0600 01/26/23 0233  HGB 8.8*  --   --  8.9* 8.7*  HCT 28.0*  --   --  28.2* 27.4*  PLT 307  --   --  329 352  APTT 53*   < > 76* 85* 108*  HEPARINUNFRC >1.10*  --   --  >1.10* >1.10*  CREATININE 1.12  --   --  1.19 1.26*   < > = values in this interval not displayed.     Estimated Creatinine Clearance: 31.6 mL/min (A) (by C-G formula based on SCr of 1.26 mg/dL (H)).   Medical History: Past Medical History:  Diagnosis Date   Carotid artery disease (HCC)    L-ICA 100%, mod R-ICA dz   COPD (chronic obstructive pulmonary disease) (HCC)    Coronary artery disease    s/p CABG February 2010 by Dr. Andrey Spearman   Diabetes [facility])    Hyperlipidemia    Hypertension    Peripheral arterial disease (HCC)    post left common iliac and right SFA stenting remotely   Tobacco abuse         Medications:  Eliquis 2.5mg  BID PTA, last dose unknown  Restarted Eliquis 5mg  BID inpatient from 5/30 - 5/31 (4 doses)   Assessment: Franc is a 72 year old male with a history of Afib on Eliquis prior to admission. He was restarted on Eliquis after being deemed not a candidate for cholecystectomy given high risk profile. His last dose of eliquis was 01/22/2023 PM. Heparin now to be restarted given concern for ischemic foot. Will start heparin 12 hours after last eliquis  dose and avoid bolus given recent use of therapeutic anticoagulation. Will measure aPTT and anti-Xa until correlating.   aPTT supratherapeutic this AM. Plan for angiogram today. Decrease heparin until surgery.   Goal of Therapy:  Heparin level 0.3-0.7 units/ml aPTT 66-102 seconds Monitor platelets by anticoagulation protocol: Yes   Plan:  Decrase heparin 900 units/h Check aPTT in 8 hr or post surgery Daily aPTT and heparin level  Monitor for signs and symptoms of bleeding and CBC F/u AC post procedure  Ulyses Southward, PharmD, BCIDP, AAHIVP, CPP Infectious Disease Pharmacist 01/26/2023 8:28 AM

## 2023-01-27 ENCOUNTER — Inpatient Hospital Stay (HOSPITAL_COMMUNITY): Payer: Medicare Other

## 2023-01-27 ENCOUNTER — Encounter (HOSPITAL_COMMUNITY): Payer: Self-pay | Admitting: Internal Medicine

## 2023-01-27 ENCOUNTER — Encounter (HOSPITAL_COMMUNITY)
Admission: EM | Disposition: A | Discharge status: Hospice | Payer: Self-pay | Source: Home / Self Care | Attending: Internal Medicine

## 2023-01-27 DIAGNOSIS — J449 Chronic obstructive pulmonary disease, unspecified: Secondary | ICD-10-CM

## 2023-01-27 DIAGNOSIS — Z87891 Personal history of nicotine dependence: Secondary | ICD-10-CM

## 2023-01-27 DIAGNOSIS — E1151 Type 2 diabetes mellitus with diabetic peripheral angiopathy without gangrene: Secondary | ICD-10-CM

## 2023-01-27 DIAGNOSIS — I10 Essential (primary) hypertension: Secondary | ICD-10-CM

## 2023-01-27 DIAGNOSIS — E8729 Other acidosis: Secondary | ICD-10-CM | POA: Diagnosis not present

## 2023-01-27 DIAGNOSIS — I2510 Atherosclerotic heart disease of native coronary artery without angina pectoris: Secondary | ICD-10-CM

## 2023-01-27 DIAGNOSIS — I70235 Atherosclerosis of native arteries of right leg with ulceration of other part of foot: Secondary | ICD-10-CM

## 2023-01-27 DIAGNOSIS — Z951 Presence of aortocoronary bypass graft: Secondary | ICD-10-CM

## 2023-01-27 HISTORY — PX: AMPUTATION: SHX166

## 2023-01-27 LAB — CBC
HCT: 26.2 % — ABNORMAL LOW (ref 39.0–52.0)
Hemoglobin: 8.2 g/dL — ABNORMAL LOW (ref 13.0–17.0)
MCH: 28.5 pg (ref 26.0–34.0)
MCHC: 31.3 g/dL (ref 30.0–36.0)
MCV: 91 fL (ref 80.0–100.0)
Platelets: 343 10*3/uL (ref 150–400)
RBC: 2.88 MIL/uL — ABNORMAL LOW (ref 4.22–5.81)
RDW: 14.3 % (ref 11.5–15.5)
WBC: 12.3 10*3/uL — ABNORMAL HIGH (ref 4.0–10.5)
nRBC: 0 % (ref 0.0–0.2)

## 2023-01-27 LAB — BASIC METABOLIC PANEL
Anion gap: 11 (ref 5–15)
BUN: 19 mg/dL (ref 8–23)
CO2: 22 mmol/L (ref 22–32)
Calcium: 8.7 mg/dL — ABNORMAL LOW (ref 8.9–10.3)
Chloride: 99 mmol/L (ref 98–111)
Creatinine, Ser: 1.07 mg/dL (ref 0.61–1.24)
GFR, Estimated: >60 mL/min (ref >60)
Glucose, Bld: 125 mg/dL — ABNORMAL HIGH (ref 70–99)
Potassium: 4.1 mmol/L (ref 3.5–5.1)
Sodium: 132 mmol/L — ABNORMAL LOW (ref 135–145)

## 2023-01-27 LAB — GLUCOSE, CAPILLARY
Glucose-Capillary: 119 mg/dL — ABNORMAL HIGH (ref 70–99)
Glucose-Capillary: 133 mg/dL — ABNORMAL HIGH (ref 70–99)
Glucose-Capillary: 120 mg/dL — ABNORMAL HIGH (ref 70–99)
Glucose-Capillary: 92 mg/dL (ref 70–99)
Glucose-Capillary: 58 mg/dL — ABNORMAL LOW (ref 70–99)
Glucose-Capillary: 71 mg/dL (ref 70–99)
Glucose-Capillary: 131 mg/dL — ABNORMAL HIGH (ref 70–99)
Glucose-Capillary: 133 mg/dL — ABNORMAL HIGH (ref 70–99)
Glucose-Capillary: 255 mg/dL — ABNORMAL HIGH (ref 70–99)

## 2023-01-27 LAB — BRAIN NATRIURETIC PEPTIDE: B Natriuretic Peptide: 314.1 pg/mL — ABNORMAL HIGH (ref 0.0–100.0)

## 2023-01-27 LAB — C-REACTIVE PROTEIN: CRP: 8.6 mg/dL — ABNORMAL HIGH (ref <1.0)

## 2023-01-27 LAB — MAGNESIUM: Magnesium: 1.8 mg/dL (ref 1.7–2.4)

## 2023-01-27 LAB — PROCALCITONIN: Procalcitonin: 0.18 ng/mL

## 2023-01-27 LAB — APTT: aPTT: 72 seconds — ABNORMAL HIGH (ref 24–36)

## 2023-01-27 LAB — HEPARIN LEVEL (UNFRACTIONATED): Heparin Unfractionated: 0.83 IU/mL — ABNORMAL HIGH (ref 0.30–0.70)

## 2023-01-27 SURGERY — AMPUTATION, ABOVE KNEE
Anesthesia: Regional | Site: Knee | Laterality: Right

## 2023-01-27 MED ORDER — LIDOCAINE 2% (20 MG/ML) 5 ML SYRINGE
INTRAMUSCULAR | Status: DC | PRN
  Administered 2023-01-27: 40 mg via INTRAVENOUS

## 2023-01-27 MED ORDER — FENTANYL CITRATE (PF) 100 MCG/2ML IJ SOLN
25.0000 ug | INTRAMUSCULAR | Status: DC | PRN
  Administered 2023-01-27 (×2): 25 ug via INTRAVENOUS

## 2023-01-27 MED ORDER — CEFAZOLIN SODIUM-DEXTROSE 2-3 GM-%(50ML) IV SOLR
INTRAVENOUS | Status: DC | PRN
  Administered 2023-01-27: 2 g via INTRAVENOUS

## 2023-01-27 MED ORDER — MIDAZOLAM HCL 2 MG/2ML IJ SOLN
INTRAMUSCULAR | Status: AC
  Filled 2023-01-27: qty 2

## 2023-01-27 MED ORDER — FENTANYL CITRATE (PF) 100 MCG/2ML IJ SOLN
INTRAMUSCULAR | Status: AC
  Filled 2023-01-27: qty 2

## 2023-01-27 MED ORDER — ROPIVACAINE HCL 5 MG/ML IJ SOLN
INTRAMUSCULAR | Status: DC | PRN
  Administered 2023-01-27: 30 mL via PERINEURAL

## 2023-01-27 MED ORDER — CEFAZOLIN SODIUM 1 G IJ SOLR
INTRAMUSCULAR | Status: AC
  Filled 2023-01-27: qty 10

## 2023-01-27 MED ORDER — INSULIN ASPART 100 UNIT/ML IJ SOLN: 0.0000 [IU] | INTRAMUSCULAR | Status: DC | PRN

## 2023-01-27 MED ORDER — LACTATED RINGERS IV SOLN: INTRAVENOUS | Status: DC

## 2023-01-27 MED ORDER — DEXTROSE 50 % IV SOLN: 25.0000 mL | Freq: Once | INTRAVENOUS | Status: DC

## 2023-01-27 MED ORDER — ONDANSETRON HCL 4 MG/2ML IJ SOLN
INTRAMUSCULAR | Status: DC | PRN
  Administered 2023-01-27: 4 mg via INTRAVENOUS

## 2023-01-27 MED ORDER — 0.9 % SODIUM CHLORIDE (POUR BTL) OPTIME
TOPICAL | Status: DC | PRN
  Administered 2023-01-27: 1000 mL

## 2023-01-27 MED ORDER — BUPIVACAINE HCL (PF) 0.5 % IJ SOLN
INTRAMUSCULAR | Status: AC
  Filled 2023-01-27: qty 30

## 2023-01-27 MED ORDER — HEPARIN (PORCINE) 25000 UT/250ML-% IV SOLN
900.0000 [IU]/h | INTRAVENOUS | Status: DC
  Administered 2023-01-28: 900 [IU]/h via INTRAVENOUS
  Filled 2023-01-27: qty 250

## 2023-01-27 MED ORDER — ORAL CARE MOUTH RINSE: 15.0000 mL | Freq: Once | OROMUCOSAL | Status: AC

## 2023-01-27 MED ORDER — BUPIVACAINE HCL (PF) 0.25 % IJ SOLN
INTRAMUSCULAR | Status: AC
  Filled 2023-01-27: qty 20

## 2023-01-27 MED ORDER — PHENYLEPHRINE 80 MCG/ML (10ML) SYRINGE FOR IV PUSH (FOR BLOOD PRESSURE SUPPORT)
PREFILLED_SYRINGE | INTRAVENOUS | Status: DC | PRN
  Administered 2023-01-27 (×3): 160 ug via INTRAVENOUS

## 2023-01-27 MED ORDER — FENTANYL CITRATE (PF) 250 MCG/5ML IJ SOLN
INTRAMUSCULAR | Status: AC
  Filled 2023-01-27: qty 5

## 2023-01-27 MED ORDER — DEXAMETHASONE SODIUM PHOSPHATE 4 MG/ML IJ SOLN
INTRAMUSCULAR | Status: DC | PRN
  Administered 2023-01-27: 5 mg via INTRAVENOUS

## 2023-01-27 MED ORDER — DEXTROSE 50 % IV SOLN
INTRAVENOUS | Status: AC
  Filled 2023-01-27: qty 50

## 2023-01-27 MED ORDER — FENTANYL CITRATE (PF) 250 MCG/5ML IJ SOLN
INTRAMUSCULAR | Status: DC | PRN
  Administered 2023-01-27: 50 ug via INTRAVENOUS

## 2023-01-27 MED ORDER — PROPOFOL 10 MG/ML IV BOLUS
INTRAVENOUS | Status: DC | PRN
  Administered 2023-01-27: 90 mg via INTRAVENOUS

## 2023-01-27 MED ORDER — CHLORHEXIDINE GLUCONATE 0.12 % MT SOLN
OROMUCOSAL | Status: AC
  Filled 2023-01-27: qty 15

## 2023-01-27 MED ORDER — CHLORHEXIDINE GLUCONATE 0.12 % MT SOLN: 15.0000 mL | OROMUCOSAL | Status: AC

## 2023-01-27 MED ORDER — CHLORHEXIDINE GLUCONATE 0.12 % MT SOLN
15.0000 mL | Freq: Once | OROMUCOSAL | Status: AC
  Administered 2023-01-27: 15 mL via OROMUCOSAL

## 2023-01-27 SURGICAL SUPPLY — 57 items
BAG COUNTER SPONGE SURGICOUNT (BAG) ×2 IMPLANT
BAG SPNG CNTER NS LX DISP (BAG) ×1
BLADE SAW GIGLI 510 (BLADE) ×2 IMPLANT
BNDG CMPR 5X6 CHSV STRCH STRL (GAUZE/BANDAGES/DRESSINGS) ×1
BNDG CMPR STD VLCR NS LF 5.8X4 (GAUZE/BANDAGES/DRESSINGS) ×1
BNDG COHESIVE 6X5 TAN ST LF (GAUZE/BANDAGES/DRESSINGS) ×2 IMPLANT
BNDG ELASTIC 4X5.8 VLCR NS LF (GAUZE/BANDAGES/DRESSINGS) IMPLANT
BNDG ELASTIC 4X5.8 VLCR STR LF (GAUZE/BANDAGES/DRESSINGS) ×2 IMPLANT
BNDG ELASTIC 6X5.8 VLCR STR LF (GAUZE/BANDAGES/DRESSINGS) ×2 IMPLANT
BNDG GAUZE DERMACEA FLUFF 4 (GAUZE/BANDAGES/DRESSINGS) ×4 IMPLANT
BNDG GZE DERMACEA 4 6PLY (GAUZE/BANDAGES/DRESSINGS) ×1
BUR DISC 0.8X25 (BURR) IMPLANT
CANISTER SUCT 3000ML PPV (MISCELLANEOUS) ×2 IMPLANT
CLIP TI MEDIUM 6 (CLIP) ×2 IMPLANT
CNTNR URN SCR LID CUP LEK RST (MISCELLANEOUS) ×2 IMPLANT
CONT SPEC 4OZ STRL OR WHT (MISCELLANEOUS) ×1
COVER SURGICAL LIGHT HANDLE (MISCELLANEOUS) ×2 IMPLANT
DRAIN CHANNEL 19F RND (DRAIN) IMPLANT
DRAPE DERMATAC (DRAPES) IMPLANT
DRAPE HALF SHEET 40X57 (DRAPES) ×2 IMPLANT
DRAPE INCISE IOBAN 66X45 STRL (DRAPES) IMPLANT
DRAPE ORTHO SPLIT 77X108 STRL (DRAPES) ×2
DRAPE SURG ORHT 6 SPLT 77X108 (DRAPES) ×4 IMPLANT
DRSG ADAPTIC 3X8 NADH LF (GAUZE/BANDAGES/DRESSINGS) ×2 IMPLANT
ELECT CAUTERY BLADE 6.4 (BLADE) ×2 IMPLANT
ELECT REM PT RETURN 9FT ADLT (ELECTROSURGICAL) ×1
ELECTRODE REM PT RTRN 9FT ADLT (ELECTROSURGICAL) ×2 IMPLANT
EVACUATOR SILICONE 100CC (DRAIN) IMPLANT
GAUZE 4X4 16PLY ~~LOC~~+RFID DBL (SPONGE) ×2 IMPLANT
GAUZE SPONGE 4X4 12PLY STRL (GAUZE/BANDAGES/DRESSINGS) ×4 IMPLANT
GLOVE SURG SS PI 7.5 STRL IVOR (GLOVE) ×6 IMPLANT
GOWN STRL REUS W/ TWL LRG LVL3 (GOWN DISPOSABLE) ×4 IMPLANT
GOWN STRL REUS W/ TWL XL LVL3 (GOWN DISPOSABLE) ×2 IMPLANT
GOWN STRL REUS W/TWL LRG LVL3 (GOWN DISPOSABLE) ×2
GOWN STRL REUS W/TWL XL LVL3 (GOWN DISPOSABLE) ×1
KIT BASIN OR (CUSTOM PROCEDURE TRAY) ×2 IMPLANT
KIT TURNOVER KIT B (KITS) ×2 IMPLANT
NDL HYPO 25GX1X1/2 BEV (NEEDLE) ×2 IMPLANT
NEEDLE HYPO 25GX1X1/2 BEV (NEEDLE) ×1 IMPLANT
NS IRRIG 1000ML POUR BTL (IV SOLUTION) ×2 IMPLANT
PACK GENERAL/GYN (CUSTOM PROCEDURE TRAY) ×2 IMPLANT
PAD ARMBOARD 7.5X6 YLW CONV (MISCELLANEOUS) ×4 IMPLANT
RASP HELIOCORDIAL MED (MISCELLANEOUS) IMPLANT
STAPLER VISISTAT 35W (STAPLE) ×2 IMPLANT
STOCKINETTE IMPERVIOUS LG (DRAPES) ×2 IMPLANT
SUT ETHILON 3 0 PS 1 (SUTURE) IMPLANT
SUT SILK 0 TIES 10X30 (SUTURE) ×2 IMPLANT
SUT SILK 2 0 (SUTURE)
SUT SILK 2 0 SH CR/8 (SUTURE) IMPLANT
SUT SILK 2-0 18XBRD TIE 12 (SUTURE) IMPLANT
SUT SILK 3 0 (SUTURE)
SUT SILK 3-0 18XBRD TIE 12 (SUTURE) IMPLANT
SUT VIC AB 2-0 CT1 18 (SUTURE) ×4 IMPLANT
SYR CONTROL 10ML LL (SYRINGE) ×2 IMPLANT
TOWEL GREEN STERILE (TOWEL DISPOSABLE) ×2 IMPLANT
UNDERPAD 30X36 HEAVY ABSORB (UNDERPADS AND DIAPERS) ×2 IMPLANT
WATER STERILE IRR 1000ML POUR (IV SOLUTION) ×2 IMPLANT

## 2023-01-27 NOTE — Anesthesia Procedure Notes (Signed)
Procedure Name: LMA Insertion Date/Time: 01/27/2023 4:05 PM  Performed by: Alwyn Ren, CRNAPre-anesthesia Checklist: Patient identified, Emergency Drugs available, Suction available and Patient being monitored Patient Re-evaluated:Patient Re-evaluated prior to induction Oxygen Delivery Method: Circle system utilized Preoxygenation: Pre-oxygenation with 100% oxygen Induction Type: IV induction LMA: LMA inserted LMA Size: 4.0 Number of attempts: 1

## 2023-01-27 NOTE — Anesthesia Preprocedure Evaluation (Addendum)
Anesthesia Evaluation  Patient identified by MRN, date of birth, ID band Patient confused    Reviewed: NPO status , Patient's Chart, lab work & pertinent test results  Airway Mallampati: II  TM Distance: >3 FB Neck ROM: Full    Dental  (+) Edentulous Upper, Edentulous Lower   Pulmonary COPD, former smoker   Pulmonary exam normal        Cardiovascular hypertension, Pt. on medications and Pt. on home beta blockers + CAD, + CABG and + Peripheral Vascular Disease   Rhythm:Regular Rate:Normal    1. Technically difficult study with limited visualizaiton of cardiac  structures.   2. Left ventricular ejection fraction, by estimation, is 50 to 55%. The  left ventricle has low normal function. The left ventricle demonstrates  regional wall motion abnormalities (see scoring diagram/findings for  description). The LV apex is dyskinetic  and aneurysmal.   3. Right ventricular systolic function is normal. The right ventricular  size is normal.   4. The mitral valve is normal in structure. No evidence of mitral valve  regurgitation. No evidence of mitral stenosis.   5. The aortic valve is normal in structure. There is mild calcification  of the aortic valve. Aortic valve regurgitation is not visualized. No  aortic stenosis is present.   6. The inferior vena cava is normal in size with greater than 50%  respiratory variability, suggesting right atrial pressure of 3 mmHg.     Neuro/Psych CVA  negative psych ROS   GI/Hepatic Neg liver ROS,GERD  Medicated,,  Endo/Other  diabetes, Type 2    Renal/GU   negative genitourinary   Musculoskeletal negative musculoskeletal ROS (+)    Abdominal Normal abdominal exam  (+)   Peds  Hematology negative hematology ROS (+) Lab Results      Component                Value               Date                      WBC                      12.3 (H)            01/27/2023                HGB                       8.2 (L)             01/27/2023                HCT                      26.2 (L)            01/27/2023                MCV                      91.0                01/27/2023                PLT                      343  01/27/2023             Lab Results      Component                Value               Date                      NA                       132 (L)             01/27/2023                K                        4.1                 01/27/2023                CO2                      22                  01/27/2023                GLUCOSE                  125 (H)             01/27/2023                BUN                      19                  01/27/2023                CREATININE               1.07                01/27/2023                CALCIUM                  8.7 (L)             01/27/2023                GFRNONAA                 >60                 01/27/2023              Anesthesia Other Findings   Reproductive/Obstetrics                             Anesthesia Physical Anesthesia Plan  ASA: 3  Anesthesia Plan: General and Regional   Post-op Pain Management: Regional block*   Induction: Intravenous  PONV Risk Score and Plan: 2 and Ondansetron, Dexamethasone and Treatment may vary due to age or medical condition  Airway Management Planned: Mask and LMA  Additional Equipment: None  Intra-op Plan:   Post-operative Plan: Extubation in OR  Informed Consent: I have reviewed the patients History and Physical, chart, labs and discussed  the procedure including the risks, benefits and alternatives for the proposed anesthesia with the patient or authorized representative who has indicated his/her understanding and acceptance.     Consent reviewed with POA  Plan Discussed with:   Anesthesia Plan Comments:        Anesthesia Quick Evaluation

## 2023-01-27 NOTE — Progress Notes (Addendum)
       Ischemic rest pain right LE Plan right AKA with Dr. Myra Gianotti today NPO Patient agrees to proceed with surgery  Mosetta Pigeon PA-C  VASCULAR STAFF ADDENDUM: I have independently interviewed and examined the patient. I agree with the above.  Non-salvageable right lower extremity. Needs R AKA. Discussed with niece who is amenable.   Rande Brunt. Lenell Antu, MD [facility] Phone Number: [phone] 01/27/2023 3:38 PM

## 2023-01-27 NOTE — Progress Notes (Signed)
PROGRESS NOTE        PATIENT DETAILS Name: Reginald Ayers Age: 72 y.o. Sex: male Date of Birth: 1950-09-04 Admit Date: 01/14/2023 Admitting Physician Jonah Blue, MD WUJ:[id number], Selena Batten, NP  Brief Summary: Patient is a 72 y.o.  male COPD, CAD s/p CABG, PAD s/p stent, HLD, HTN-who presented with weakness.  Per H&P-patient was found at home with bedbugs, and covered in fecal matter.   Significant events: 5/23>> admit to [facility]-weakness/nausea/vomiting-found covered in fecal matter/bedbugs. 5/24>> MRCP with choledocholithiasis 5/28>> ERCP-sphincterotomy 5/29>> evaluation by cardiology-high risk for cholecystectomy 5/30>> General surgery signed off-no plans to pursue cholecystectomy. 6/01>> right foot toes-purple-right foot is cold.  Vascular surgery consulted.  Significant studies: 5/23>> MRI brain: No acute abnormality-chronically occluded left ICA 5/23>> CT abdomen/pelvis: Mild groundglass density in the right lower lobe, distended gallbladder  without calcified stone, dilated CBD measuring up to 19 mm with suspicion for intrahepatic dilatation. 5/24>> MRCP:Severe intrahepatic/extrahepatic biliary ductal dilatation-tiny gallstones/sludge within the cystic duct near the ampulla.  Significant microbiology data: None  Procedures: 5/28>> ERCP-sphincterotomy  Consults: GI General surgery Cardiology Vascular surgery  Subjective:   Patient in bed, appears comfortable, denies any headache, no fever, no chest pain or pressure, no shortness of breath , no abdominal pain. No new focal weakness.   Objective: Vitals: Blood pressure (!) 146/77, pulse 90, temperature 98.8 F (37.1 C), temperature source Oral, resp. rate 19, height 5\' 1"  (1.549 m), weight 43.5 kg, SpO2 98 %.   Exam:  Awake Alert, No new F.N deficits, Normal affect [place].AT,PERRAL Supple Neck, No JVD,   Symmetrical Chest wall movement, Good air movement bilaterally, CTAB RRR,No Gallops, Rubs or  new Murmurs,  +ve B.Sounds, Abd Soft, No tenderness,   Right lower extremity all toes cyanotic, midfoot cyanotic, also some cyanotic blotches up to his mid leg,    Assessment/Plan:  Weakness/nausea/vomiting - Dilated bile ducts secondary to choledocholithiasis -  Poor historian-unclear etiology-given MRCP findings-concerned that his clinical presentation on admission could have been due to a passed CBD stone/symptomatic choledocholithiasis. He is s/p ERCP on 5/28-not felt to be a good candidate for cholecystectomy given high risk profile. Nausea/vomiting has resolved-he is tolerating regular diet.  Anion gap metabolic acidosis  - Likely starvation ketoacidosis-due to poor intake/possible vomiting-in the setting of possible passed CBD stone. Improving with supportive care  PAD, Right foot ischemic foot - Has had numerous procedures in the past-follow with Dr. Myra Gianotti, Prior to this hospitalization he was on aspirin/Plavix/Eliquis - VVS following, continue IV heparin and Plavix, per vascular surgery this is likely due to his chronic PAD getting worse, underwent angiogram on 01/26/2023 and likely will require BKA on 01/27/2023, continue pain control medications adjusted on 01/25/2023.  He is agreeable for blood transfusion during the perioperative.  History of CVA No obvious deficits on exam-MRI brain negative Continue statin See below regarding antiplatelets.  PAF Sinus rhythm Eliquis was initially held-subsequently placed on IV heparin-since no cholecystectomy was planned-transition back to Eliquis-he unfortunately now has developed ischemic changes in the right foot-stopping Eliquis and changing back to IV heparin.    AKI Mild Improved with supportive care.   Hyponatremia Mild Asymptomatic Seems to be slowly improving with gentle IV fluid hydration.  HTN BP better controlled with amlodipine/metoprolol.  CAD-s/p CABG No anginal symptoms See below regarding antiplatelets. Continue  statin/beta-blocker/antiplatelets  Acute toxic metabolic encephalopathy Somewhat hard to  arouse this morning-but much more easily aroused this afternoon.  ABG stable-nonfocal exam. Suspect this may be due to gabapentin-will discontinue and monitor closely.  COPD Stable Bronchodilators  Hyponatremia, osmolality, urine sodium and serum uric acid suggest towards SIADH, failed fluid restriction on 01/25/2023 on 01/26/2023, gentle Lasix repeat, if no improvement Samsca or 01/11/2023.  Debility/deconditioning Failure to thrive Secondary to acute illness-frailty at baseline [facility] refuses SNF-wants to go home with home health.  Understands risk of rehospitalization/decompensation.  Claims lives with 2 roommates-and has 7 dogs that he misses.  DM-2 CBGs on the higher side-but due to lingering encephalopathy today-will not adjust insulin doses-as oral intake is poor Continue Semglee 14 units daily, 4 units of NovoLog with send SSI. Watch closely-he is very frail-and is not a candidate for aggressive glycemic control.    Recent Labs    01/26/23 2051 01/27/23 0634 01/27/23 0859  GLUCAP 117* 119* 133*    Code status:   Code Status: DNR   DVT Prophylaxis: IV heparin gtt   Family Communication: Niece-[name] [phone]-left VM on 5/25, 5/26,5/27   Disposition Plan: Status is: Observation The patient will require care spanning > 2 midnights and should be moved to inpatient because: Severity of illness.   Planned Discharge Destination:Home health   Diet: Diet Order             Diet NPO time specified Except for: Sips with Meds  Diet effective midnight                     Antimicrobial agents: Anti-infectives (From admission, onward)    Start     Dose/Rate Route Frequency Ordered Stop   01/19/23 1130  ciprofloxacin (CIPRO) IVPB 400 mg        400 mg 200 mL/hr over 60 Minutes Intravenous  Once 01/19/23 1037 01/19/23 1230   01/14/23 1745  cefTRIAXone (ROCEPHIN) 1 g in  sodium chloride 0.9 % 100 mL IVPB        1 g 200 mL/hr over 30 Minutes Intravenous  Once 01/14/23 1741 01/14/23 1922   01/14/23 1745  azithromycin (ZITHROMAX) 500 mg in sodium chloride 0.9 % 250 mL IVPB        500 mg 250 mL/hr over 60 Minutes Intravenous  Once 01/14/23 1741 01/14/23 2315        MEDICATIONS: Scheduled Meds:   stroke: early stages of recovery book   Does not apply Once   amLODipine  5 mg Oral Daily   clopidogrel  75 mg Oral Daily   feeding supplement  237 mL Oral TID BM   insulin aspart  0-15 Units Subcutaneous TID WC   insulin aspart  0-5 Units Subcutaneous QHS   insulin aspart  4 Units Subcutaneous TID WC   insulin glargine-yfgn  14 Units Subcutaneous Daily   metoprolol succinate  25 mg Oral Daily   multivitamin with minerals  1 tablet Oral Daily   pantoprazole  40 mg Oral Daily   polyethylene glycol  17 g Oral Daily   rosuvastatin  20 mg Oral Daily   umeclidinium bromide  1 puff Inhalation QHS   Continuous Infusions:  sodium chloride 100 mL/hr at 01/27/23 0310   heparin 900 Units/hr (01/26/23 1529)   PRN Meds:.acetaminophen **OR** acetaminophen (TYLENOL) oral liquid 160 mg/5 mL **OR** acetaminophen, albuterol, alum & mag hydroxide-simeth, bisacodyl, hydrALAZINE, HYDROcodone-acetaminophen, ondansetron (ZOFRAN) IV, traMADol   I have personally reviewed following labs and imaging studies  LABORATORY DATA:  Recent Labs  Lab 01/21/23 0426  01/23/23 1218 01/24/23 0341 01/25/23 0600 01/26/23 0233 01/27/23 0643  WBC 10.8*  --  11.4* 12.7* 11.7* 12.3*  HGB 9.1* 9.5* 8.8* 8.9* 8.7* 8.2*  HCT 27.5* 28.0* 28.0* 28.2* 27.4* 26.2*  PLT 300  --  307 329 352 343  MCV 87.9  --  91.5 89.0 87.8 91.0  MCH 29.1  --  28.8 28.1 27.9 28.5  MCHC 33.1  --  31.4 31.6 31.8 31.3  RDW 14.1  --  14.3 14.2 14.2 14.3    Recent Labs  Lab 01/23/23 0224 01/23/23 1218 01/24/23 0341 01/25/23 0600 01/26/23 0233 01/27/23 0643  NA 127* 132* 129* 127* 128* 132*  K 5.1 4.1 4.7  4.8 4.9 4.1  CL 92*  --  94* 94* 92* 99  CO2 26  --  25 24 26 22   ANIONGAP 9  --  10 9 10 11   GLUCOSE 198*  --  187* 181* 145* 125*  BUN 34*  --  28* 22 28* 19  CREATININE 1.18  --  1.12 1.19 1.26* 1.07  CRP  --   --  2.6* 3.6* 5.2* 8.6*  PROCALCITON  --   --  0.31 0.16 0.41 0.18  BNP  --   --  264.5* 329.0* 223.8* 314.1*  MG  --   --  1.8 1.6* 1.8 1.8  CALCIUM 9.0  --  8.9 9.2 9.2 8.7*    Lab Results  Component Value Date   CHOL 127 01/15/2023   HDL 28 (L) 01/15/2023   LDLCALC 63 01/15/2023   TRIG 179 (H) 01/15/2023   CHOLHDL 4.5 01/15/2023      Recent Labs  Lab 01/23/23 0224 01/24/23 0341 01/25/23 0600 01/26/23 0233 01/27/23 0643  CRP  --  2.6* 3.6* 5.2* 8.6*  PROCALCITON  --  0.31 0.16 0.41 0.18  BNP  --  264.5* 329.0* 223.8* 314.1*  MG  --  1.8 1.6* 1.8 1.8  CALCIUM 9.0 8.9 9.2 9.2 8.7*   MICROBIOLOGY: No results found for this or any previous visit (from the past 240 hour(s)).  RADIOLOGY STUDIES/RESULTS: ABORTED INVASIVE LAB PROCEDURE  Result Date: 01/27/2023 This case was aborted.    LOS: 12 days   Signature  -    Susa Raring M.D on 01/27/2023 at 10:57 AM   -  To page go to [email or web address]

## 2023-01-27 NOTE — Anesthesia Procedure Notes (Signed)
Anesthesia Regional Block: Femoral nerve block   Pre-Anesthetic Checklist: , timeout performed,  Correct Patient, Correct Site, Correct Laterality,  Correct Procedure, Correct Position, site marked,  Risks and benefits discussed,  Surgical consent,  Pre-op evaluation,  At surgeon's request and post-op pain management  Laterality: Right  Prep: Dura Prep       Needles:  Injection technique: Single-shot  Needle Type: Echogenic Stimulator Needle     Needle Length: 10cm  Needle Gauge: 20     Additional Needles:   Procedures:,,,, ultrasound used (permanent image in chart),,    Narrative:  Start time: 01/27/2023 4:05 PM End time: 01/27/2023 4:07 PM Injection made incrementally with aspirations every 5 mL.  Performed by: Personally  Anesthesiologist: Atilano Median, DO  Additional Notes: Patient identified. Risks/Benefits/Options discussed with patient including but not limited to bleeding, infection, nerve damage, failed block, incomplete pain control. Patient expressed understanding and wished to proceed. All questions were answered. Sterile technique was used throughout the entire procedure. Please see nursing notes for vital signs. Aspirated in 5cc intervals with injection for negative confirmation. Patient was given instructions on fall risk and not to get out of bed. All questions and concerns addressed with instructions to call with any issues or inadequate analgesia.

## 2023-01-27 NOTE — Transfer of Care (Signed)
Immediate Anesthesia Transfer of Care Note  Patient: Reginald Ayers  Procedure(s) Performed: RIGHT AMPUTATION ABOVE KNEE (Right: Knee)  Patient Location: PACU  Anesthesia Type:General  Level of Consciousness: awake  Airway & Oxygen Therapy: Patient Spontanous Breathing and Patient connected to nasal cannula oxygen  Post-op Assessment: Report given to RN and Post -op Vital signs reviewed and stable  Post vital signs: Reviewed and stable  Last Vitals:  Vitals Value Taken Time  BP 148/64 01/27/23 1718  Temp    Pulse 84 01/27/23 1726  Resp 17 01/27/23 1726  SpO2 100 % 01/27/23 1726  Vitals shown include unvalidated device data.  Last Pain:  Vitals:   01/27/23 1408  TempSrc: Oral  PainSc:       Patients Stated Pain Goal: 1 (01/26/23 2351)  Complications: No notable events documented.

## 2023-01-27 NOTE — Progress Notes (Signed)
PT Cancellation Note  Patient Details Name: Reginald Ayers MRN: [id number] DOB: [birth year]   Cancelled Treatment:    Reason Eval/Treat Not Completed: (P) Patient at procedure or test/unavailable, transport present to take pt to OR. Will check back as schedule allows to continue with PT POC.  Lenora Boys. PTA Acute Rehabilitation Services Office: [phone]    Catalina Antigua 01/27/2023, 12:50 PM

## 2023-01-27 NOTE — Anesthesia Procedure Notes (Signed)
Anesthesia Regional Block: Sciatic   Pre-Anesthetic Checklist: , timeout performed,  Correct Patient, Correct Site, Correct Laterality,  Correct Procedure, Correct Position, site marked,  Risks and benefits discussed,  Surgical consent,  Pre-op evaluation,  At surgeon's request and post-op pain management  Laterality: Right  Prep: Dura Prep       Needles:  Injection technique: Single-shot  Needle Type: Echogenic Stimulator Needle     Needle Length: 10cm  Needle Gauge: 20     Additional Needles:   Procedures:,,,, ultrasound used (permanent image in chart),,    Narrative:  Start time: 01/27/2023 4:05 PM End time: 01/27/2023 4:09 PM Injection made incrementally with aspirations every 5 mL.  Performed by: Personally  Anesthesiologist: Atilano Median, DO  Additional Notes: Patient identified. Risks/Benefits/Options discussed with patient including but not limited to bleeding, infection, nerve damage, failed block, incomplete pain control. Patient expressed understanding and wished to proceed. All questions were answered. Sterile technique was used throughout the entire procedure. Please see nursing notes for vital signs. Aspirated in 5cc intervals with injection for negative confirmation. Patient was given instructions on fall risk and not to get out of bed. All questions and concerns addressed with instructions to call with any issues or inadequate analgesia.

## 2023-01-27 NOTE — Op Note (Signed)
    Patient name: Reginald Ayers MRN: [id number] DOB: 01-08-1951 Sex: male  01/14/2023 - 01/27/2023 Pre-operative Diagnosis: Ischemic right leg Post-operative diagnosis:  Same Surgeon:  [name] Assistants:  C.Baglia, PA Procedure:   Right above-knee amputation Anesthesia: Regional Blood Loss: Minimal Specimens: Right leg  Findings: Viable muscle at amputation site  Indications: This is a 73 year old gentleman who has undergone multiple prior attempts at revascularization for limb salvage.  He has now had at least a 12-day history of progressive foot pain.  He was scheduled for angiography yesterday however when I examined his foot it was mottled and nonviable.  He did not have motor or sensory function and had pain up to the level of the knee.  I felt his best chance for survival would be amputation which she has agreed to.  Procedure:  The patient was identified in the holding area and taken to [facility] OR ROOM 12  The patient was then placed supine on the table. general anesthesia was administered.  The patient was prepped and draped in the usual sterile fashion.  A time out was called and antibiotics were administered.  A PA was necessary to expedite the procedure and assist with technical details.  She help with exposure by providing suction and retraction.  She help with wound closure.  A fishmouth incision was made just proximal to the patella.  Cautery was used to the subcutaneous tissue down to the fascia which was opened with cautery.  I then circumferentially exposed the femur.  I then divided the majority of the muscle with cautery and identified the neurovascular bundle.  Next, a Gigli saw was used to transect the femur, beveling the anterior surface.  I then clamped the neurovascular bundle with a Kelly clamp and divided the vessels.  The leg was then removed and sent as a specimen.  I then removed a stent within the superficial femoral artery.  I ligated the femoral artery and vein  proximal to the cut edge of the femur.  Of note, there was no flow within the superficial femoral artery.  I then identified the nerve and ligated it proximal to the cut edge of the femur.  Next a rasp was used to smooth the bone surface.  The wound was then copiously irrigated.  Hemostasis was achieved.  The fascia was reapproximated with interrupted 2-0 Vicryl.  The skin was closed staples.  There were no immediate complications.  Sterile dressings were applied.   Disposition: To PACU stable.   Juleen China, M.D., [facility] [place] Office: [phone] Pager:  [phone]

## 2023-01-28 ENCOUNTER — Encounter (HOSPITAL_COMMUNITY): Payer: Self-pay | Admitting: Surgery

## 2023-01-28 DIAGNOSIS — E8729 Other acidosis: Secondary | ICD-10-CM | POA: Diagnosis not present

## 2023-01-28 LAB — BASIC METABOLIC PANEL
Anion gap: 11 (ref 5–15)
BUN: 17 mg/dL (ref 8–23)
CO2: 23 mmol/L (ref 22–32)
Calcium: 8.9 mg/dL (ref 8.9–10.3)
Chloride: 99 mmol/L (ref 98–111)
Creatinine, Ser: 1.12 mg/dL (ref 0.61–1.24)
GFR, Estimated: >60 mL/min (ref >60)
Glucose, Bld: 209 mg/dL — ABNORMAL HIGH (ref 70–99)
Potassium: 4 mmol/L (ref 3.5–5.1)
Sodium: 133 mmol/L — ABNORMAL LOW (ref 135–145)

## 2023-01-28 LAB — CBC WITH DIFFERENTIAL/PLATELET
Abs Immature Granulocytes: 0.12 10*3/uL — ABNORMAL HIGH (ref 0.00–0.07)
Basophils Absolute: 0 10*3/uL (ref 0.0–0.1)
Basophils Relative: 0 %
Eosinophils Absolute: 0 10*3/uL (ref 0.0–0.5)
Eosinophils Relative: 0 %
HCT: 27.9 % — ABNORMAL LOW (ref 39.0–52.0)
Hemoglobin: 8.7 g/dL — ABNORMAL LOW (ref 13.0–17.0)
Immature Granulocytes: 1 %
Lymphocytes Relative: 2 %
Lymphs Abs: 0.3 10*3/uL — ABNORMAL LOW (ref 0.7–4.0)
MCH: 29.1 pg (ref 26.0–34.0)
MCHC: 31.2 g/dL (ref 30.0–36.0)
MCV: 93.3 fL (ref 80.0–100.0)
Monocytes Absolute: 1 10*3/uL (ref 0.1–1.0)
Monocytes Relative: 7 %
Neutro Abs: 13.2 10*3/uL — ABNORMAL HIGH (ref 1.7–7.7)
Neutrophils Relative %: 90 %
Platelets: 375 10*3/uL (ref 150–400)
RBC: 2.99 MIL/uL — ABNORMAL LOW (ref 4.22–5.81)
RDW: 14.3 % (ref 11.5–15.5)
WBC: 14.6 10*3/uL — ABNORMAL HIGH (ref 4.0–10.5)
nRBC: 0 % (ref 0.0–0.2)

## 2023-01-28 LAB — GLUCOSE, CAPILLARY
Glucose-Capillary: 187 mg/dL — ABNORMAL HIGH (ref 70–99)
Glucose-Capillary: 320 mg/dL — ABNORMAL HIGH (ref 70–99)
Glucose-Capillary: 168 mg/dL — ABNORMAL HIGH (ref 70–99)
Glucose-Capillary: 103 mg/dL — ABNORMAL HIGH (ref 70–99)

## 2023-01-28 LAB — HEPARIN LEVEL (UNFRACTIONATED): Heparin Unfractionated: 0.59 IU/mL (ref 0.30–0.70)

## 2023-01-28 LAB — APTT: aPTT: 55 seconds — ABNORMAL HIGH (ref 24–36)

## 2023-01-28 LAB — TYPE AND SCREEN
ABO/RH(D): O NEG
Antibody Screen: NEGATIVE

## 2023-01-28 LAB — MAGNESIUM: Magnesium: 2 mg/dL (ref 1.7–2.4)

## 2023-01-28 LAB — BRAIN NATRIURETIC PEPTIDE: B Natriuretic Peptide: 1121.9 pg/mL — ABNORMAL HIGH (ref 0.0–100.0)

## 2023-01-28 LAB — PROCALCITONIN: Procalcitonin: 0.14 ng/mL

## 2023-01-28 MED ORDER — APIXABAN 5 MG PO TABS
5.0000 mg | ORAL_TABLET | Freq: Two times a day (BID) | ORAL | Status: DC
  Administered 2023-01-29 (×2): 5 mg via ORAL
  Filled 2023-01-28 (×2): qty 1

## 2023-01-28 MED ORDER — INSULIN ASPART 100 UNIT/ML IJ SOLN
3.0000 [IU] | Freq: Three times a day (TID) | INTRAMUSCULAR | Status: DC
  Administered 2023-01-28 – 2023-01-29 (×5): 3 [IU] via SUBCUTANEOUS

## 2023-01-28 MED ORDER — FUROSEMIDE 10 MG/ML IJ SOLN
20.0000 mg | Freq: Once | INTRAMUSCULAR | Status: AC
  Administered 2023-01-28: 20 mg via INTRAVENOUS
  Filled 2023-01-28: qty 2

## 2023-01-28 NOTE — Progress Notes (Signed)
ANTICOAGULATION CONSULT NOTE  Pharmacy Consult for heparin Indication: atrial fibrillation  Allergies  Allergen Reactions   Actos [Pioglitazone] Shortness Of Breath    Leg swelling    Lipitor [Atorvastatin] Other (See Comments)    Leg pain    Zestril [Lisinopril] Cough    Patient Measurements: Height: 5\' 1"  (154.9 cm) Weight: 43.5 kg (95 lb 14.4 oz) IBW/kg (Calculated) : 52.3 Heparin Dosing Weight: 43.5 kg  Vital Signs: Temp: 97.9 F (36.6 C) (06/06 1200) Temp Source: Oral (06/06 1200) BP: 145/62 (06/06 0800) Pulse Rate: 83 (06/06 0800)  Labs: Recent Labs    01/26/23 0233 01/26/23 1728 01/26/23 2133 01/27/23 0643 01/28/23 0725  HGB 8.7*  --   --  8.2* 8.7*  HCT 27.4*  --   --  26.2* 27.9*  PLT 352  --   --  343 375  APTT 108* 60* 79* 72*  --   HEPARINUNFRC >1.10*  --   --  0.83*  --   CREATININE 1.26*  --   --  1.07 1.12     Estimated Creatinine Clearance: 35.6 mL/min (by C-G formula based on SCr of 1.12 mg/dL).   Medical History: Past Medical History:  Diagnosis Date   Carotid artery disease (HCC)    L-ICA 100%, mod R-ICA dz   COPD (chronic obstructive pulmonary disease) (HCC)    Coronary artery disease    s/p CABG February 2010 by Dr. Andrey Spearman   Diabetes [facility])    Hyperlipidemia    Hypertension    Peripheral arterial disease (HCC)    post left common iliac and right SFA stenting remotely   Tobacco abuse         Medications:  Eliquis 2.5mg  BID PTA, last dose unknown  Restarted Eliquis 5mg  BID inpatient from 5/30 - 5/31 (4 doses)   Assessment: Reginald Ayers is a 72 year old male with a history of Afib on Eliquis prior to admission. He was restarted on Eliquis after being deemed not a candidate for cholecystectomy given high risk profile. His last dose of eliquis was 01/22/2023 PM. Heparin now to be restarted given concern for ischemic foot. Will start heparin 12 hours after last eliquis dose and avoid bolus given recent use of therapeutic  anticoagulation. Will measure aPTT and anti-Xa until correlating.   Heparin resume this AM post AKA 6/5. Plan to transition back to apixaban tomorrow.   Goal of Therapy:  Heparin level 0.3-0.7 units/ml aPTT 66-102 seconds Monitor platelets by anticoagulation protocol: Yes   Plan:  Resume heparin 900 units/hr until 0700 8 hr PTT/HL Daily aPTT and heparin level  Resume apixaban 5mg  BID in AM  Ulyses Southward, PharmD, BCIDP, AAHIVP, CPP Infectious Disease Pharmacist 01/28/2023 1:25 PM

## 2023-01-28 NOTE — Progress Notes (Signed)
  Progress Note    01/28/2023 7:35 AM 1 Day Post-Op  Subjective:  sleeping but wakes easily.  Says his throat is dry but not much pain in the stump.  Afebrile  Vitals:   01/27/23 2000 01/27/23 2312  BP: (!) 154/60 (!) 165/66  Pulse: 89 90  Resp: 20 20  Temp: 98.5 F (36.9 C) 98.4 F (36.9 C)  SpO2: 96% 98%    Physical Exam: Incisions:  bandage to right AKA is clean and dry and in tact.   CBC    Component Value Date/Time   WBC 12.3 (H) 01/27/2023 0643   RBC 2.88 (L) 01/27/2023 0643   HGB 8.2 (L) 01/27/2023 0643   HCT 26.2 (L) 01/27/2023 0643   PLT 343 01/27/2023 0643   MCV 91.0 01/27/2023 0643   MCH 28.5 01/27/2023 0643   MCHC 31.3 01/27/2023 0643   RDW 14.3 01/27/2023 0643   LYMPHSABS 0.5 (L) 01/14/2023 1120   MONOABS 0.6 01/14/2023 1120   EOSABS 0.0 01/14/2023 1120   BASOSABS 0.0 01/14/2023 1120    BMET    Component Value Date/Time   NA 132 (L) 01/27/2023 0643   K 4.1 01/27/2023 0643   CL 99 01/27/2023 0643   CO2 22 01/27/2023 0643   GLUCOSE 125 (H) 01/27/2023 0643   BUN 19 01/27/2023 0643   CREATININE 1.07 01/27/2023 0643   CALCIUM 8.7 (L) 01/27/2023 0643   GFRNONAA >60 01/27/2023 0643   GFRAA >60 09/25/2017 2016    INR    Component Value Date/Time   INR 1.2 01/14/2023 1120     Intake/Output Summary (Last 24 hours) at 01/28/2023 0735 Last data filed at 01/28/2023 0600 Gross per 24 hour  Intake 500 ml  Output 575 ml  Net -75 ml     Assessment/Plan:  72 y.o. male is s/p right above knee amputation   1 Day Post-Op   -pt pain is well controlled -bandage is clean-will remove this tomorrow.     Doreatha Massed, PA-C [facility] [phone] 01/28/2023 7:35 AM

## 2023-01-28 NOTE — Progress Notes (Signed)
PROGRESS NOTE        PATIENT DETAILS Name: Reginald Ayers Age: 72 y.o. Sex: male Date of Birth: [birth year] Admit Date: 01/14/2023 Admitting Physician Jonah Blue, MD ZOX:[id number], Selena Batten, NP  Brief Summary: Patient is a 72 y.o.  male COPD, CAD s/p CABG, PAD s/p stent, HLD, HTN-who presented with weakness.  Per H&P-patient was found at home with bedbugs, and covered in fecal matter.   Significant events: 5/23>> admit to [facility]-weakness/nausea/vomiting-found covered in fecal matter/bedbugs. 5/24>> MRCP with choledocholithiasis 5/28>> ERCP-sphincterotomy 5/29>> evaluation by cardiology-high risk for cholecystectomy 5/30>> General surgery signed off-no plans to pursue cholecystectomy. 6/01>> right foot toes-purple-right foot is cold.  Vascular surgery consulted.  Significant studies: 5/23>> MRI brain: No acute abnormality-chronically occluded left ICA 5/23>> CT abdomen/pelvis: Mild groundglass density in the right lower lobe, distended gallbladder  without calcified stone, dilated CBD measuring up to 19 mm with suspicion for intrahepatic dilatation. 5/24>> MRCP:Severe intrahepatic/extrahepatic biliary ductal dilatation-tiny gallstones/sludge within the cystic duct near the ampulla.  Significant microbiology data: None  Procedures: 5/28>> ERCP-sphincterotomy 01/27/2023.  Right AKA by vascular surgery  Consults: GI General surgery Cardiology Vascular surgery  Subjective:   Patient in bed resting comfortably, still has postop right AKA pain, no chest pain or shortness of breath, no focal weakness.   Objective: Vitals: Blood pressure (!) 145/62, pulse 83, temperature 98.7 F (37.1 C), temperature source Oral, resp. rate 14, height 5\' 1"  (1.549 m), weight 43.5 kg, SpO2 96 %.   Exam:  Awake Alert, No new F.N deficits, Normal affect [place].AT,PERRAL Supple Neck, No JVD,   Symmetrical Chest wall movement, Good air movement bilaterally, CTAB RRR,No Gallops, Rubs  or new Murmurs,  +ve B.Sounds, Abd Soft, No tenderness,   Right AKA stump under bandage    Assessment/Plan:  Weakness/nausea/vomiting - Dilated bile ducts secondary to choledocholithiasis -  Poor historian-unclear etiology-given MRCP findings-concerned that his clinical presentation on admission could have been due to a passed CBD stone/symptomatic choledocholithiasis. He is s/p ERCP on 5/28-not felt to be a good candidate for cholecystectomy given high risk profile. Nausea/vomiting has resolved-he is tolerating regular diet.  Anion gap metabolic acidosis  - Likely starvation ketoacidosis-due to poor intake/possible vomiting-in the setting of possible passed CBD stone. Improving with supportive care  PAD, Right foot ischemic foot - Has had numerous procedures in the past-follow with Dr. Myra Gianotti, Prior to this hospitalization he was on aspirin/Plavix/Eliquis - [facility]VS following, continue IV heparin and Plavix, per vascular surgery this is likely due to his chronic PAD getting worse, seen by vascular surgery and underwent AKA on 01/27/2023, continue pain control, agreeable for transfusion if needed.  History of CVA No obvious deficits on exam-MRI brain negative Continue statin See below regarding antiplatelets.  PAF Sinus rhythm Eliquis was initially held-subsequently placed on IV heparin-since no cholecystectomy was planned-transition back to Eliquis-he unfortunately now has developed ischemic changes in the right foot-stopping Eliquis and changing back to IV heparin.    AKI Mild Improved with supportive care.   Hyponatremia Mild Asymptomatic Seems to be slowly improving with gentle IV fluid hydration.  HTN BP better controlled with amlodipine/metoprolol.  CAD-s/p CABG No anginal symptoms See below regarding antiplatelets. Continue statin/beta-blocker/antiplatelets  Acute toxic metabolic encephalopathy Somewhat hard to arouse this morning-but much more easily aroused this  afternoon.  ABG stable-nonfocal exam. Suspect this may be due to gabapentin-will discontinue and  monitor closely.  COPD Stable Bronchodilators  Hyponatremia, osmolality, urine sodium and serum uric acid suggest towards SIADH, failed fluid restriction on 01/25/2023 on 01/26/2023, gentle Lasix repeat, gradual improvement continue to monitor.  Debility/deconditioning Failure to thrive Secondary to acute illness-frailty at baseline [facility] refuses SNF-wants to go home with home health.  Understands risk of rehospitalization/decompensation.  Claims lives with 2 roommates-and has 7 dogs that he misses.  DM-2 CBGs on the higher side-but due to lingering encephalopathy today-will not adjust insulin doses-as oral intake is poor Continue Semglee 14 units daily, 4 units of NovoLog with send SSI. Watch closely-he is very frail-and is not a candidate for aggressive glycemic control.    Recent Labs    01/27/23 1827 01/27/23 2110 01/28/23 0827  GLUCAP 131* 255* 187*    Code status:   Code Status: DNR   DVT Prophylaxis: IV heparin gtt   Family Communication: Niece-[name] [phone]-left VM on 5/25, 5/26,5/27   Disposition Plan:   The patient will require care spanning > 2 midnights and should be moved to inpatient because: Severity of illness.   Planned Discharge Destination:Home health   Diet: Diet Order             DIET SOFT Fluid consistency: Thin  Diet effective now                     Antimicrobial agents: Anti-infectives (From admission, onward)    Start     Dose/Rate Route Frequency Ordered Stop   01/19/23 1130  ciprofloxacin (CIPRO) IVPB 400 mg        400 mg 200 mL/hr over 60 Minutes Intravenous  Once 01/19/23 1037 01/19/23 1230   01/14/23 1745  cefTRIAXone (ROCEPHIN) 1 g in sodium chloride 0.9 % 100 mL IVPB        1 g 200 mL/hr over 30 Minutes Intravenous  Once 01/14/23 1741 01/14/23 1922   01/14/23 1745  azithromycin (ZITHROMAX) 500 mg in sodium  chloride 0.9 % 250 mL IVPB        500 mg 250 mL/hr over 60 Minutes Intravenous  Once 01/14/23 1741 01/14/23 2315        MEDICATIONS: Scheduled Meds:   stroke: early stages of recovery book   Does not apply Once   amLODipine  5 mg Oral Daily   chlorhexidine  15 mL Mouth/Throat NOW   clopidogrel  75 mg Oral Daily   feeding supplement  237 mL Oral TID BM   furosemide  20 mg Intravenous Once   insulin aspart  0-15 Units Subcutaneous TID WC   insulin aspart  0-5 Units Subcutaneous QHS   insulin aspart  3 Units Subcutaneous TID WC   insulin glargine-yfgn  14 Units Subcutaneous Daily   metoprolol succinate  25 mg Oral Daily   multivitamin with minerals  1 tablet Oral Daily   pantoprazole  40 mg Oral Daily   polyethylene glycol  17 g Oral Daily   rosuvastatin  20 mg Oral Daily   umeclidinium bromide  1 puff Inhalation QHS   Continuous Infusions:  heparin 900 Units/hr (01/28/23 0933)   lactated ringers 10 mL/hr at 01/27/23 2327   PRN Meds:.acetaminophen **OR** acetaminophen (TYLENOL) oral liquid 160 mg/5 mL **OR** acetaminophen, albuterol, alum & mag hydroxide-simeth, bisacodyl, hydrALAZINE, HYDROcodone-acetaminophen, ondansetron (ZOFRAN) IV, traMADol   I have personally reviewed following labs and imaging studies  LABORATORY DATA:  Recent Labs  Lab 01/24/23 0341 01/25/23 0600 01/26/23 0233 01/27/23 0643 01/28/23 0725  WBC 11.4* 12.7*  11.7* 12.3* 14.6*  HGB 8.8* 8.9* 8.7* 8.2* 8.7*  HCT 28.0* 28.2* 27.4* 26.2* 27.9*  PLT 307 329 352 343 375  MCV 91.5 89.0 87.8 91.0 93.3  MCH 28.8 28.1 27.9 28.5 29.1  MCHC 31.4 31.6 31.8 31.3 31.2  RDW 14.3 14.2 14.2 14.3 14.3  LYMPHSABS  --   --   --   --  0.3*  MONOABS  --   --   --   --  1.0  EOSABS  --   --   --   --  0.0  BASOSABS  --   --   --   --  0.0    Recent Labs  Lab 01/24/23 0341 01/25/23 0600 01/26/23 0233 01/27/23 0643 01/28/23 0725  NA 129* 127* 128* 132* 133*  K 4.7 4.8 4.9 4.1 4.0  CL 94* 94* 92* 99 99  CO2  25 24 26 22 23   ANIONGAP 10 9 10 11 11   GLUCOSE 187* 181* 145* 125* 209*  BUN 28* 22 28* 19 17  CREATININE 1.12 1.19 1.26* 1.07 1.12  CRP 2.6* 3.6* 5.2* 8.6*  --   PROCALCITON 0.31 0.16 0.41 0.18 0.14  BNP 264.5* 329.0* 223.8* 314.1* 1,121.9*  MG 1.8 1.6* 1.8 1.8 2.0  CALCIUM 8.9 9.2 9.2 8.7* 8.9    Lab Results  Component Value Date   CHOL 127 01/15/2023   HDL 28 (L) 01/15/2023   LDLCALC 63 01/15/2023   TRIG 179 (H) 01/15/2023   CHOLHDL 4.5 01/15/2023      Recent Labs  Lab 01/24/23 0341 01/25/23 0600 01/26/23 0233 01/27/23 0643 01/28/23 0725  CRP 2.6* 3.6* 5.2* 8.6*  --   PROCALCITON 0.31 0.16 0.41 0.18 0.14  BNP 264.5* 329.0* 223.8* 314.1* 1,121.9*  MG 1.8 1.6* 1.8 1.8 2.0  CALCIUM 8.9 9.2 9.2 8.7* 8.9   MICROBIOLOGY: No results found for this or any previous visit (from the past 240 hour(s)).  RADIOLOGY STUDIES/RESULTS: ABORTED INVASIVE LAB PROCEDURE  Result Date: 01/27/2023 This case was aborted.    LOS: 13 days   Signature  -    Susa Raring M.D on 01/28/2023 at 10:10 AM   -  To page go to [email or web address]

## 2023-01-28 NOTE — Progress Notes (Signed)
Physical Therapy Re-Evaluation Patient Details Name: Reginald Ayers MRN: [id number] DOB: 1950/10/05 Today's Date: 01/28/2023  History of Present Illness  72 y/o M admitted to [facility] on 5/23 for weakness and pain in his RLE, pt found in fecal matter and bedbugs. MRI of brain with no acute abnormality, chronically occluded left ICA. CT of abdomen/pelvis with mild groundglass denisty in the RLL, distended gallbladder without calcified stone, dilated CBD with suspicion for intrahepatic dilatation. 6/5 R AKA. PMHx: COPD, CAD s/p CABG, DM, HTN, HLD, PAD s/p stent, tobacco dependence.  Clinical Impression  Pt presents today after R AKA yesterday, remaining limited in mobility due to strength, balance, pain, and cognition. Pt was surprised about his R AKA once seated EOB, reporting he thought he was having a toe amputated, educated pt on him attempting to perform mobility in previous sessions without use of his RLE to prepare for after surgery, however pt self-distracting. Increased time required throughout session due to pain, requiring maxA to stand x2 attempts, unable to pivot to [facility] today, would benefit from drop arm commode. Pt's goals updated, recommend subacute PT in a facility setting upon discharge to maximize independence with mobility. Also recommend someone to talk to pt for emotional support. Acute PT will continue to follow up with pt as appropriate to progress mobility and address deficits.        Recommendations for follow up therapy are one component of a multi-disciplinary discharge planning process, led by the attending physician.  Recommendations may be updated based on patient status, additional functional criteria and insurance authorization.  Follow Up Recommendations Can patient physically be transported by private vehicle: No     Assistance Recommended at Discharge Frequent or constant Supervision/Assistance  Patient can return home with the following  Two people to help with walking  and/or transfers;A lot of help with bathing/dressing/bathroom;Direct supervision/assist for medications management;Assist for transportation;Help with stairs or ramp for entrance    Equipment Recommendations Other (comment) (defer to next level)  Recommendations for Other Services       Functional Status Assessment Patient has had a recent decline in their functional status and demonstrates the ability to make significant improvements in function in a reasonable and predictable amount of time.     Precautions / Restrictions Precautions Precautions: Fall Precaution Comments: bed bugs Restrictions Weight Bearing Restrictions: Yes RLE Weight Bearing: Non weight bearing Other Position/Activity Restrictions: R AKA      Mobility  Bed Mobility Overal bed mobility: Needs Assistance Bed Mobility: Supine to Sit, Sit to Supine, Rolling Rolling: Mod assist   Supine to sit: Max assist, HOB elevated Sit to supine: Mod assist, HOB elevated   General bed mobility comments: modA to roll with hand over hand to reach bed rail, assist for hip and trunk management. maxA for supine>sit for BLE management and trunk support and pivoting hips to the EOB, modA to return to supine for trunk support and LE management    Transfers Overall transfer level: Needs assistance Equipment used: 1 person hand held assist Transfers: Sit to/from Stand Sit to Stand: Max assist           General transfer comment: standing from EOB with maxA for 2 attempts, unable to fully obtain upright position but clearning bottom. Attempted to transfer to [facility] but pt unable to pivot and inc increased pain, returned to sitting    Ambulation/Gait               General Gait Details: unable today  Stairs  Wheelchair Mobility    Modified Rankin (Stroke Patients Only)       Balance Overall balance assessment: Needs assistance Sitting-balance support: Bilateral upper extremity supported, Feet  supported Sitting balance-Leahy Scale: Poor Sitting balance - Comments: reliant on UE support while seated EOB   Standing balance support: Bilateral upper extremity supported Standing balance-Leahy Scale: Zero Standing balance comment: reliant on UE and external support for balance in standing                             Pertinent Vitals/Pain Pain Assessment Pain Assessment: Faces Faces Pain Scale: Hurts whole lot Pain Location: RLE Pain Descriptors / Indicators: Discomfort, Grimacing, Guarding Pain Intervention(s): Limited activity within patient's tolerance, Monitored during session, Repositioned    Home Living Family/patient expects to be discharged to:: Private residence Living Arrangements: Other relatives;Non-relatives/Friends Available Help at Discharge: Available PRN/intermittently;Friend(s);Other (Comment) Type of Home: House Home Access: Level entry       Home Layout: One level Home Equipment: Agricultural consultant (2 wheels);Cane - single point;BSC/3in1;Rollator (4 wheels);Other (comment) Additional Comments: lives with roommates/friends    Prior Function Prior Level of Function : History of Falls (last six months);Independent/Modified Independent             Mobility Comments: SPC in the house and RW out in the community, admits to 2 falls in the house       Hand Dominance   Dominant Hand: Right    Extremity/Trunk Assessment   Upper Extremity Assessment Upper Extremity Assessment: Generalized weakness    Lower Extremity Assessment Lower Extremity Assessment: Generalized weakness (now with R AKA but able to lift against gravity, no resistance applied due to pain)    Cervical / Trunk Assessment Cervical / Trunk Assessment: Kyphotic  Communication   Communication: HOH  Cognition Arousal/Alertness: Awake/alert Behavior During Therapy: Agitated Overall Cognitive Status: No family/caregiver present to determine baseline cognitive functioning                                  General Comments: Pt self distracting, poor recall of surgery yesterday, surprised to find he had an AKA as he thought they only amputated his toe. Pt verbalizing his frustrations throughout session, intermittently yelling, not at staff but at his situation, would benefit from someone to talk to        General Comments General comments (skin integrity, edema, etc.): SPO2 89-94% on room air with mobility, bandage on R AKA site    Exercises     Assessment/Plan    PT Assessment Patient needs continued PT services  PT Problem List Decreased strength;Decreased activity tolerance;Decreased balance;Decreased mobility;Decreased coordination;Decreased cognition;Decreased knowledge of use of DME;Decreased safety awareness;Decreased knowledge of precautions;Pain       PT Treatment Interventions DME instruction;Gait training;Functional mobility training;Therapeutic activities;Therapeutic exercise;Balance training;Neuromuscular re-education;Cognitive remediation;Patient/family education;Wheelchair mobility training    PT Goals (Current goals can be found in the Care Plan section)  Acute Rehab PT Goals Patient Stated Goal: go home withi his dogs PT Goal Formulation: With patient Time For Goal Achievement: 02/11/23 Potential to Achieve Goals: Fair Additional Goals Additional Goal #1: pt will self propel manual wheelchair with BUE x100 feet with supervision for safety and steering.    Frequency Min 3X/week     Co-evaluation               AM-PAC PT "6 Clicks" Mobility  Outcome Measure  Help needed turning from your back to your side while in a flat bed without using bedrails?: A Lot Help needed moving from lying on your back to sitting on the side of a flat bed without using bedrails?: A Lot Help needed moving to and from a bed to a chair (including a wheelchair)?: Total Help needed standing up from a chair using your arms (e.g., wheelchair or  bedside chair)?: A Lot Help needed to walk in hospital room?: Total Help needed climbing 3-5 steps with a railing? : Total 6 Click Score: 9    End of Session Equipment Utilized During Treatment: Gait belt Activity Tolerance: Patient limited by pain Patient left: in bed;with call bell/phone within reach;with bed alarm set Nurse Communication: Mobility status;Other (comment) (nurse tech notified pt on bed pan) PT Visit Diagnosis: Muscle weakness (generalized) (M62.81);History of falling (Z91.81);Other abnormalities of gait and mobility (R26.89);Difficulty in walking, not elsewhere classified (R26.2)    Time: 1610-9604 PT Time Calculation (min) (ACUTE ONLY): 35 min   Charges:   PT Evaluation $PT Re-evaluation: 1 Re-eval PT Treatments $Therapeutic Activity: 8-22 mins        Lindalou Hose, PT DPT Acute Rehabilitation Services Office [phone]   Leonie Man 01/28/2023, 2:54 PM

## 2023-01-28 NOTE — TOC Progression Note (Addendum)
Transition of Care [facility]) - Progression Note    Patient Details  Name: Reginald Ayers MRN: [id number] Date of Birth: [birth year]  [facility]) CM/SW Contact  Mearl Latin, LCSW Phone Number: 01/28/2023, 4:15 PM  Clinical Narrative:    CSW spent time speaking with patient. He expressed frustration at losing his leg when he thought they were only going to remove his toe. He stated that he still wants to go home instead of a nursing facility but that it may not be a bad idea to learn how to use crutches at SNF. CSW provided SNF bed offers and Medicare ratings list. After reviewing list, he stated he would like to go with [facility]. CSW updated the facility and will watch for medical progression. He stated he has not gotten his roommate on the phone today but said CSW could try. CSW assisted patient to charge his phone.   CSW contacted patient's roommate, Reginald Ayers, and he appreciated the call as he stated they have not been able to get patient to answer his room phone. CSW provided nursing station number as well.    Expected Discharge Plan: Skilled Nursing Facility Barriers to Discharge: Continued Medical Work up  Expected Discharge Plan and Services In-house Referral: Clinical Social Work   Post Acute Care Choice: Skilled Nursing Facility Living arrangements for the past 2 months: Boarding House                                       Social Determinants of Health (SDOH) Interventions SDOH Screenings   Housing: Medium Risk (01/15/2023)  Tobacco Use: High Risk (01/28/2023)    Readmission Risk Interventions    01/12/2022   10:29 AM  Readmission Risk Prevention Plan  Transportation Screening Complete  PCP or Specialist Appt within 3-5 Days Complete  HRI or Home Care Consult Complete  Social Work Consult for Recovery Care Planning/Counseling Complete  Palliative Care Screening Not Applicable  Medication Review Ocean[employer]) Complete

## 2023-01-28 NOTE — Anesthesia Postprocedure Evaluation (Signed)
Anesthesia Post Note  Patient: Reginald Ayers  Procedure(s) Performed: RIGHT AMPUTATION ABOVE KNEE (Right: Knee)     Patient location during evaluation: PACU Anesthesia Type: Regional and General Level of consciousness: awake and alert Pain management: pain level controlled Vital Signs Assessment: post-procedure vital signs reviewed and stable Respiratory status: spontaneous breathing, nonlabored ventilation, respiratory function stable and patient connected to nasal cannula oxygen Cardiovascular status: blood pressure returned to baseline and stable Postop Assessment: no apparent nausea or vomiting Anesthetic complications: no   No notable events documented.  Last Vitals:  Vitals:   01/28/23 0800 01/28/23 1200  BP: (!) 145/62   Pulse: 83   Resp: 14   Temp: 37.1 C 36.6 C  SpO2: 96%     Last Pain:  Vitals:   01/28/23 1200  TempSrc: Oral  PainSc:                  Nelle Don Calven Gilkes

## 2023-01-28 NOTE — Progress Notes (Signed)
ANTICOAGULATION CONSULT NOTE  Pharmacy Consult for heparin Indication: atrial fibrillation  Allergies  Allergen Reactions   Actos [Pioglitazone] Shortness Of Breath    Leg swelling    Lipitor [Atorvastatin] Other (See Comments)    Leg pain    Zestril [Lisinopril] Cough    Patient Measurements: Height: 5\' 1"  (154.9 cm) Weight: 43.5 kg (95 lb 14.4 oz) IBW/kg (Calculated) : 52.3 Heparin Dosing Weight: 43.5 kg  Vital Signs: Temp: 97.9 F (36.6 C) (06/06 1200) Temp Source: Oral (06/06 1200) BP: 145/62 (06/06 0800) Pulse Rate: 83 (06/06 0800)  Labs: Recent Labs    01/26/23 0233 01/26/23 1728 01/26/23 2133 01/27/23 0643 01/28/23 0725 01/28/23 1736  HGB 8.7*  --   --  8.2* 8.7*  --   HCT 27.4*  --   --  26.2* 27.9*  --   PLT 352  --   --  343 375  --   APTT 108*   < > 79* 72*  --  55*  HEPARINUNFRC >1.10*  --   --  0.83*  --  0.59  CREATININE 1.26*  --   --  1.07 1.12  --    < > = values in this interval not displayed.     Estimated Creatinine Clearance: 35.6 mL/min (by C-G formula based on SCr of 1.12 mg/dL).   Medical History: Past Medical History:  Diagnosis Date   Carotid artery disease (HCC)    L-ICA 100%, mod R-ICA dz   COPD (chronic obstructive pulmonary disease) (HCC)    Coronary artery disease    s/p CABG February 2010 by Dr. Andrey Spearman   Diabetes [facility])    Hyperlipidemia    Hypertension    Peripheral arterial disease (HCC)    post left common iliac and right SFA stenting remotely   Tobacco abuse         Medications:  Eliquis 2.5mg  BID PTA, last dose unknown  Restarted Eliquis 5mg  BID inpatient from 5/30 - 5/31 (4 doses)   Assessment: Yoshio is a 72 year old male with a history of Afib on Eliquis prior to admission. He was restarted on Eliquis after being deemed not a candidate for cholecystectomy given high risk profile. His last dose of eliquis was 01/22/2023 PM. Heparin now to be restarted given concern for ischemic foot. Will start  heparin 12 hours after last eliquis dose and avoid bolus given recent use of therapeutic anticoagulation. Will measure aPTT and anti-Xa until correlating.   Heparin resume this AM post AKA 6/5. Plan to transition back to apixaban tomorrow.   6/6 PM update: HL 0.59 aPTT 55 seconds No signs of bleeding or issues with gtt   Goal of Therapy:  Heparin level 0.3-0.7 units/ml aPTT 66-102 seconds Monitor platelets by anticoagulation protocol: Yes   Plan:  Continue heparin 900 units/hr until 0700 on 01/29/2023 Resume apixaban 5mg  BID in AM  Michalle Rademaker BS, PharmD, BCPS Clinical Pharmacist 01/28/2023 7:13 PM  Contact: [phone] after 3 PM  "Be curious, not judgmental..." -Debbora Dus

## 2023-01-28 NOTE — Progress Notes (Signed)
Nutrition Follow-up  DOCUMENTATION CODES:  Severe malnutrition in context of chronic illness, Underweight  INTERVENTION:  Continue Multivitamin w/ minerals daily Continue Ensure Enlive po TID, each supplement provides 350 kcal and 20 grams of protein. Liberalize pt diet to regular to increase PO intake for assist in wound healing Meal ordering with assist Magic cup BID with meals, each supplement provides 290 kcal and 9 grams of protein  NUTRITION DIAGNOSIS:  Severe Malnutrition related to chronic illness as evidenced by severe muscle depletion, severe fat depletion. - Ongoing   GOAL:  Patient will meet greater than or equal to 90% of their needs - Ongoing   MONITOR:  Skin, PO intake, Supplement acceptance, Labs  REASON FOR ASSESSMENT:  Consult Assessment of nutrition requirement/status  ASSESSMENT:  72 y.o. male presented to the ED with RLE pain, weakness, fecal matter and bedbugs. PMH includes CKD IIIa, GERD, COPD, PAD, HTN, and HLD. Pt admitted with metabolic acidosis and R side weakness.   5/23 - Admitted 6/05 - Op, s/p R AKA  Pt sitting up in bed, eating breakfast. Shares about his surgery that he had yesterday. About 25% of meal consumed. Pt says that he likes the supplements and will drink them.   Discussed adjusting pt diet to regular to allow more options with MD. MD ok with change.   Meal Intake  5/27-5/29: 35-100% x 6 meals  5/30: 75% x 1 meal  Medications reviewed and include: NovoLog SSI + 3 units, Semglee, MVI, Protonix, Miralax Labs reviewed: Sodium 133, Potassium 4.0, Magnesium 2.0 CBG: 58-255 x 24 hours  Diet Order:   Diet Order             Diet regular Room service appropriate? Yes with Assist; Fluid consistency: Thin  Diet effective now                  EDUCATION NEEDS:  No education needs have been identified at this time  Skin:  Skin Assessment: Skin Integrity Issues: Skin Integrity Issues:: Stage II Stage II: R buttocks  Last BM:   6/6 - Type 2, medium  Height:  Ht Readings from Last 1 Encounters:  01/19/23 5\' 1"  (1.549 m)   Weight:  Wt Readings from Last 1 Encounters:  01/19/23 43.5 kg   Ideal Body Weight:  50.9 kg  BMI:  Body mass index is 18.12 kg/m.  Estimated Nutritional Needs:  Kcal:  1500-1700 Protein:  70-90 grams Fluid:  >/= 1.5 L   Kirby Crigler RD, LDN Clinical Dietitian See [facility] for contact information.

## 2023-01-29 ENCOUNTER — Inpatient Hospital Stay (HOSPITAL_COMMUNITY): Payer: Medicare Other

## 2023-01-29 LAB — CBC WITH DIFFERENTIAL/PLATELET
Abs Immature Granulocytes: 0.1 10*3/uL — ABNORMAL HIGH (ref 0.00–0.07)
Basophils Absolute: 0 10*3/uL (ref 0.0–0.1)
Basophils Relative: 0 %
Eosinophils Absolute: 0 10*3/uL (ref 0.0–0.5)
Eosinophils Relative: 0 %
HCT: 28.1 % — ABNORMAL LOW (ref 39.0–52.0)
Hemoglobin: 8.8 g/dL — ABNORMAL LOW (ref 13.0–17.0)
Immature Granulocytes: 1 %
Lymphocytes Relative: 3 %
Lymphs Abs: 0.5 10*3/uL — ABNORMAL LOW (ref 0.7–4.0)
MCH: 28.9 pg (ref 26.0–34.0)
MCHC: 31.3 g/dL (ref 30.0–36.0)
MCV: 92.1 fL (ref 80.0–100.0)
Monocytes Absolute: 1.2 10*3/uL — ABNORMAL HIGH (ref 0.1–1.0)
Monocytes Relative: 6 %
Neutro Abs: 17 10*3/uL — ABNORMAL HIGH (ref 1.7–7.7)
Neutrophils Relative %: 90 %
Platelets: 410 10*3/uL — ABNORMAL HIGH (ref 150–400)
RBC: 3.05 MIL/uL — ABNORMAL LOW (ref 4.22–5.81)
RDW: 14.3 % (ref 11.5–15.5)
WBC: 18.8 10*3/uL — ABNORMAL HIGH (ref 4.0–10.5)
nRBC: 0 % (ref 0.0–0.2)

## 2023-01-29 LAB — SURGICAL PATHOLOGY

## 2023-01-29 LAB — MAGNESIUM: Magnesium: 1.7 mg/dL (ref 1.7–2.4)

## 2023-01-29 LAB — GLUCOSE, CAPILLARY
Glucose-Capillary: 240 mg/dL — ABNORMAL HIGH (ref 70–99)
Glucose-Capillary: 198 mg/dL — ABNORMAL HIGH (ref 70–99)
Glucose-Capillary: 122 mg/dL — ABNORMAL HIGH (ref 70–99)
Glucose-Capillary: 214 mg/dL — ABNORMAL HIGH (ref 70–99)

## 2023-01-29 LAB — BASIC METABOLIC PANEL
Anion gap: 13 (ref 5–15)
BUN: 26 mg/dL — ABNORMAL HIGH (ref 8–23)
CO2: 21 mmol/L — ABNORMAL LOW (ref 22–32)
Calcium: 9 mg/dL (ref 8.9–10.3)
Chloride: 96 mmol/L — ABNORMAL LOW (ref 98–111)
Creatinine, Ser: 1.16 mg/dL (ref 0.61–1.24)
GFR, Estimated: >60 mL/min (ref >60)
Glucose, Bld: 250 mg/dL — ABNORMAL HIGH (ref 70–99)
Potassium: 3.7 mmol/L (ref 3.5–5.1)
Sodium: 130 mmol/L — ABNORMAL LOW (ref 135–145)

## 2023-01-29 LAB — APTT: aPTT: 73 seconds — ABNORMAL HIGH (ref 24–36)

## 2023-01-29 LAB — BRAIN NATRIURETIC PEPTIDE: B Natriuretic Peptide: 414.3 pg/mL — ABNORMAL HIGH (ref 0.0–100.0)

## 2023-01-29 LAB — HEPARIN LEVEL (UNFRACTIONATED): Heparin Unfractionated: 0.61 IU/mL (ref 0.30–0.70)

## 2023-01-29 MED ORDER — TOLVAPTAN 15 MG PO TABS
15.0000 mg | ORAL_TABLET | Freq: Once | ORAL | Status: AC
  Administered 2023-01-29: 15 mg via ORAL
  Filled 2023-01-29: qty 1

## 2023-01-29 MED ORDER — BISACODYL 10 MG RE SUPP
10.0000 mg | Freq: Every day | RECTAL | Status: DC
  Administered 2023-01-29: 10 mg via RECTAL
  Filled 2023-01-29 (×2): qty 1

## 2023-01-29 MED ORDER — TAMSULOSIN HCL 0.4 MG PO CAPS
0.4000 mg | ORAL_CAPSULE | Freq: Every day | ORAL | Status: DC
  Administered 2023-01-29: 0.4 mg via ORAL
  Filled 2023-01-29: qty 1

## 2023-01-29 MED ORDER — HYDROCODONE-ACETAMINOPHEN 5-325 MG PO TABS: 1.0000 | ORAL_TABLET | Freq: Four times a day (QID) | ORAL | Status: DC | PRN

## 2023-01-29 MED ORDER — SODIUM CHLORIDE 0.9 % IV SOLN
3.0000 g | Freq: Four times a day (QID) | INTRAVENOUS | Status: DC
  Administered 2023-01-29 – 2023-02-01 (×11): 3 g via INTRAVENOUS
  Filled 2023-01-29 (×11): qty 8

## 2023-01-29 MED ORDER — DILTIAZEM HCL 25 MG/5ML IV SOLN
10.0000 mg | Freq: Four times a day (QID) | INTRAVENOUS | Status: DC | PRN
  Administered 2023-01-29: 10 mg via INTRAVENOUS
  Filled 2023-01-29: qty 5

## 2023-01-29 MED ORDER — ALBUTEROL SULFATE (2.5 MG/3ML) 0.083% IN NEBU
2.5000 mg | INHALATION_SOLUTION | Freq: Once | RESPIRATORY_TRACT | Status: AC
  Administered 2023-01-29: 2.5 mg via RESPIRATORY_TRACT
  Filled 2023-01-29: qty 3

## 2023-01-29 MED ORDER — DILTIAZEM HCL 60 MG PO TABS
60.0000 mg | ORAL_TABLET | Freq: Three times a day (TID) | ORAL | Status: DC
  Administered 2023-01-29: 60 mg via ORAL
  Filled 2023-01-29 (×2): qty 1

## 2023-01-29 MED ORDER — DEXTROSE IN LACTATED RINGERS 5 % IV SOLN: INTRAVENOUS | Status: DC

## 2023-01-29 MED ORDER — ACETAMINOPHEN 325 MG PO TABS: 650.0000 mg | ORAL_TABLET | Freq: Four times a day (QID) | ORAL | Status: DC | PRN

## 2023-01-29 NOTE — Progress Notes (Signed)
OT Cancellation Note  Patient Details Name: Reginald Ayers MRN: [id number] DOB: 03-23-1951   Cancelled Treatment:    Reason Eval/Treat Not Completed: Medical issues which prohibited therapy (Per pt's RN, pt is not medically appropriate for OT session this day secondary to vomiting throughout the day leading to aspiration. Acute OT to continue to follow pt as pt is appropriate to participate.)  Eddison Searls "Ronaldo Miyamoto" M., OTR/L, MA Acute Rehab [phone]   Lendon Colonel 01/29/2023, 3:03 PM

## 2023-01-29 NOTE — Plan of Care (Signed)

## 2023-01-29 NOTE — Progress Notes (Signed)
PROGRESS NOTE        PATIENT DETAILS Name: Reginald Ayers Age: 72 y.o. Sex: male Date of Birth: Jan 21, 1951 Admit Date: 01/14/2023 Admitting Physician Jonah Blue, MD WUJ:[id number], Selena Batten, NP  Brief Summary: Patient is a 72 y.o.  male COPD, CAD s/p CABG, PAD s/p stent, HLD, HTN-who presented with weakness.  Per H&P-patient was found at home with bedbugs, and covered in fecal matter.   Significant events: 5/23>> admit to [facility]-weakness/nausea/vomiting-found covered in fecal matter/bedbugs. 5/24>> MRCP with choledocholithiasis 5/28>> ERCP-sphincterotomy 5/29>> evaluation by cardiology-high risk for cholecystectomy 5/30>> General surgery signed off-no plans to pursue cholecystectomy. 6/01>> right foot toes-purple-right foot is cold.  Vascular surgery consulted.  Significant studies: 5/23>> MRI brain: No acute abnormality-chronically occluded left ICA 5/23>> CT abdomen/pelvis: Mild groundglass density in the right lower lobe, distended gallbladder  without calcified stone, dilated CBD measuring up to 19 mm with suspicion for intrahepatic dilatation. 5/24>> MRCP:Severe intrahepatic/extrahepatic biliary ductal dilatation-tiny gallstones/sludge within the cystic duct near the ampulla.  Significant microbiology data: None  Procedures: 5/28>> ERCP-sphincterotomy 01/27/2023.  Right AKA by vascular surgery  Consults: GI General surgery Cardiology Vascular surgery  Subjective:   Patient in bed, appears comfortable, denies any headache, no fever, no chest pain or pressure, no shortness of breath , no abdominal pain. No new focal weakness. Mild stump pain better.   Objective: Vitals: Blood pressure 139/66, pulse (!) 107, temperature 98.4 F (36.9 C), temperature source Oral, resp. rate 20, height 5\' 1"  (1.549 m), weight 43.5 kg, SpO2 98 %.   Exam:  Awake Alert, No new F.N deficits, Normal affect [place].AT,PERRAL Supple Neck, No JVD,   Symmetrical Chest wall  movement, Good air movement bilaterally, CTAB RRR,No Gallops, Rubs or new Murmurs,  +ve B.Sounds, Abd Soft, No tenderness,   Right AKA stump site stable   Assessment/Plan:  Weakness/nausea/vomiting - Dilated bile ducts secondary to choledocholithiasis -  Poor historian-unclear etiology-given MRCP findings-concerned that his clinical presentation on admission could have been due to a passed CBD stone/symptomatic choledocholithiasis. He is s/p ERCP on 5/28-not felt to be a good candidate for cholecystectomy given high risk profile. Nausea/vomiting has resolved-he is tolerating regular diet.  Anion gap metabolic acidosis  - Likely starvation ketoacidosis-due to poor intake/possible vomiting-in the setting of possible passed CBD stone. Improving with supportive care  PAD, Right foot ischemic foot - Has had numerous procedures in the past-follow with Dr. Myra Gianotti, Prior to this hospitalization he was on aspirin/Plavix/Eliquis - [facility]VS following, continue IV heparin and Plavix, per vascular surgery this is likely due to his chronic PAD getting worse, seen by vascular surgery and underwent AKA on 01/27/2023, continue pain control, agreeable for transfusion if needed.  History of CVA No obvious deficits on exam-MRI brain negative Continue statin See below regarding antiplatelets.  PAF Sinus rhythm Eliquis was initially held-subsequently placed on IV heparin-since no cholecystectomy was planned-transition back to Eliquis-he unfortunately now has developed ischemic changes in the right foot-stopping Eliquis and changing back to IV heparin.    AKI Mild Improved with supportive care.   Hyponatremia Mild Asymptomatic Seems to be slowly improving with gentle IV fluid hydration.  HTN BP better controlled with amlodipine/metoprolol.  CAD-s/p CABG No anginal symptoms See below regarding antiplatelets. Continue statin/beta-blocker/antiplatelets  Acute toxic metabolic encephalopathy Somewhat hard to  arouse this morning-but much more easily aroused this afternoon.  ABG stable-nonfocal exam.  Suspect this may be due to gabapentin-will discontinue and monitor closely.  COPD Stable Bronchodilators  Hyponatremia, osmolality, urine sodium and serum uric acid suggest towards SIADH, failed fluid restriction on 01/25/2023 on 01/26/2023, along with 2 doses of Lasix, try Samsca on 01/29/2023.  Debility/deconditioning Failure to thrive Secondary to acute illness-frailty at baseline [facility] refuses SNF-wants to go home with home health.  Understands risk of rehospitalization/decompensation.  Claims lives with 2 roommates-and has 7 dogs that he misses.  DM-2 CBGs on the higher side-but due to lingering encephalopathy today-will not adjust insulin doses-as oral intake is poor Continue Semglee 14 units daily, 4 units of NovoLog with send SSI. Watch closely-he is very frail-and is not a candidate for aggressive glycemic control.    Recent Labs    01/28/23 1719 01/28/23 2027 01/29/23 0822  GLUCAP 168* 103* 240*    Code status:   Code Status: DNR   DVT Prophylaxis: IV heparin gtt apixaban (ELIQUIS) tablet 5 mg   Family Communication: Niece-[name] [phone]-left VM on 5/25, 5/26,5/27   Disposition Plan:   The patient will require care spanning > 2 midnights and should be moved to inpatient because: Severity of illness.   Planned Discharge Destination:Home health   Diet: Diet Order             Diet regular Room service appropriate? Yes with Assist; Fluid consistency: Thin  Diet effective now                     Antimicrobial agents: Anti-infectives (From admission, onward)    Start     Dose/Rate Route Frequency Ordered Stop   01/19/23 1130  ciprofloxacin (CIPRO) IVPB 400 mg        400 mg 200 mL/hr over 60 Minutes Intravenous  Once 01/19/23 1037 01/19/23 1230   01/14/23 1745  cefTRIAXone (ROCEPHIN) 1 g in sodium chloride 0.9 % 100 mL IVPB        1 g 200 mL/hr over 30  Minutes Intravenous  Once 01/14/23 1741 01/14/23 1922   01/14/23 1745  azithromycin (ZITHROMAX) 500 mg in sodium chloride 0.9 % 250 mL IVPB        500 mg 250 mL/hr over 60 Minutes Intravenous  Once 01/14/23 1741 01/14/23 2315        MEDICATIONS: Scheduled Meds:   stroke: early stages of recovery book   Does not apply Once   amLODipine  5 mg Oral Daily   apixaban  5 mg Oral BID   clopidogrel  75 mg Oral Daily   feeding supplement  237 mL Oral TID BM   insulin aspart  0-15 Units Subcutaneous TID WC   insulin aspart  0-5 Units Subcutaneous QHS   insulin aspart  3 Units Subcutaneous TID WC   insulin glargine-yfgn  14 Units Subcutaneous Daily   metoprolol succinate  25 mg Oral Daily   multivitamin with minerals  1 tablet Oral Daily   pantoprazole  40 mg Oral Daily   polyethylene glycol  17 g Oral Daily   rosuvastatin  20 mg Oral Daily   umeclidinium bromide  1 puff Inhalation QHS   Continuous Infusions:  lactated ringers 10 mL/hr at 01/27/23 2327   PRN Meds:.acetaminophen **OR** acetaminophen (TYLENOL) oral liquid 160 mg/5 mL **OR** acetaminophen, albuterol, alum & mag hydroxide-simeth, bisacodyl, hydrALAZINE, HYDROcodone-acetaminophen, ondansetron (ZOFRAN) IV, traMADol   I have personally reviewed following labs and imaging studies  LABORATORY DATA:  Recent Labs  Lab 01/25/23 0600 01/26/23 0233 01/27/23 0643 01/28/23  0725 01/29/23 0624  WBC 12.7* 11.7* 12.3* 14.6* 18.8*  HGB 8.9* 8.7* 8.2* 8.7* 8.8*  HCT 28.2* 27.4* 26.2* 27.9* 28.1*  PLT 329 352 343 375 410*  MCV 89.0 87.8 91.0 93.3 92.1  MCH 28.1 27.9 28.5 29.1 28.9  MCHC 31.6 31.8 31.3 31.2 31.3  RDW 14.2 14.2 14.3 14.3 14.3  LYMPHSABS  --   --   --  0.3* 0.5*  MONOABS  --   --   --  1.0 1.2*  EOSABS  --   --   --  0.0 0.0  BASOSABS  --   --   --  0.0 0.0    Recent Labs  Lab 01/24/23 0341 01/25/23 0600 01/26/23 0233 01/27/23 0643 01/28/23 0725 01/29/23 0624  NA 129* 127* 128* 132* 133* 130*  K 4.7 4.8  4.9 4.1 4.0 3.7  CL 94* 94* 92* 99 99 96*  CO2 25 24 26 22 23  21*  ANIONGAP 10 9 10 11 11 13   GLUCOSE 187* 181* 145* 125* 209* 250*  BUN 28* 22 28* 19 17 26*  CREATININE 1.12 1.19 1.26* 1.07 1.12 1.16  CRP 2.6* 3.6* 5.2* 8.6*  --   --   PROCALCITON 0.31 0.16 0.41 0.18 0.14  --   BNP 264.5* 329.0* 223.8* 314.1* 1,121.9* 414.3*  MG 1.8 1.6* 1.8 1.8 2.0 1.7  CALCIUM 8.9 9.2 9.2 8.7* 8.9 9.0    Lab Results  Component Value Date   CHOL 127 01/15/2023   HDL 28 (L) 01/15/2023   LDLCALC 63 01/15/2023   TRIG 179 (H) 01/15/2023   CHOLHDL 4.5 01/15/2023      Recent Labs  Lab 01/24/23 0341 01/25/23 0600 01/26/23 0233 01/27/23 0643 01/28/23 0725 01/29/23 0624  CRP 2.6* 3.6* 5.2* 8.6*  --   --   PROCALCITON 0.31 0.16 0.41 0.18 0.14  --   BNP 264.5* 329.0* 223.8* 314.1* 1,121.9* 414.3*  MG 1.8 1.6* 1.8 1.8 2.0 1.7  CALCIUM 8.9 9.2 9.2 8.7* 8.9 9.0   MICROBIOLOGY: No results found for this or any previous visit (from the past 240 hour(s)).  RADIOLOGY STUDIES/RESULTS: No results found.   LOS: 14 days   Signature  -    Susa Raring M.D on 01/29/2023 at 9:58 AM   -  To page go to [email or web address]

## 2023-01-29 NOTE — Progress Notes (Signed)
RT called about patient desating and vomiting. Patient sating 84% on 5L. Patient placed on salter 10L. Sats up to 90%. RN in room.

## 2023-01-29 NOTE — Progress Notes (Signed)
     Right AKA healing well without erythema or edema Stump appears viable Lungs non labored breathing   S/P right AKA for ischemia without vascularization options Dressing removed stump appears viable.   Ordered sock from hanger, left incision open to air.  Mosetta Pigeon PA-C

## 2023-01-29 NOTE — Progress Notes (Signed)
Pharmacy Antibiotic Note  Reginald Ayers is a 72 y.o. male admitted on 01/14/2023 with aspiration pneumonia.  Pharmacy has been consulted for unasyn dosing.  Plan: Unasyn 3 grams iv q6h  Height: 5\' 1"  (154.9 cm) Weight: 43.5 kg (95 lb 14.4 oz) IBW/kg (Calculated) : 52.3  Temp (24hrs), Avg:98.3 F (36.8 C), Min:98.1 F (36.7 C), Max:98.5 F (36.9 C)  Recent Labs  Lab 01/25/23 0600 01/26/23 0233 01/27/23 0643 01/28/23 0725 01/29/23 0624  WBC 12.7* 11.7* 12.3* 14.6* 18.8*  CREATININE 1.19 1.26* 1.07 1.12 1.16    Estimated Creatinine Clearance: 34.4 mL/min (by C-G formula based on SCr of 1.16 mg/dL).    Allergies  Allergen Reactions   Actos [Pioglitazone] Shortness Of Breath    Leg swelling    Lipitor [Atorvastatin] Other (See Comments)    Leg pain    Zestril [Lisinopril] Cough      Thank you for allowing pharmacy to be a part of this patient's care.  Greta Doom BS, PharmD, BCPS Clinical Pharmacist 01/29/2023 2:45 PM  Contact: [phone] after 3 PM  "Be curious, not judgmental..." -Debbora Dus

## 2023-01-29 NOTE — Progress Notes (Signed)
Patient had three episodes of vomiting.Inj Zofran given.Patient's oxygen requirement increased from 1l to 6l.Paged RT was kept on 10l salter.Paged doctor.Kept NPO,chest xray,nebulization and antibiotic administered.

## 2023-01-30 ENCOUNTER — Inpatient Hospital Stay (HOSPITAL_COMMUNITY): Payer: Medicare Other

## 2023-01-30 DIAGNOSIS — Z7189 Other specified counseling: Secondary | ICD-10-CM

## 2023-01-30 DIAGNOSIS — J9601 Acute respiratory failure with hypoxia: Secondary | ICD-10-CM

## 2023-01-30 LAB — CBC WITH DIFFERENTIAL/PLATELET
Abs Immature Granulocytes: 0.1 10*3/uL — ABNORMAL HIGH (ref 0.00–0.07)
Basophils Absolute: 0 10*3/uL (ref 0.0–0.1)
Basophils Relative: 0 %
Eosinophils Absolute: 0 10*3/uL (ref 0.0–0.5)
Eosinophils Relative: 0 %
HCT: 23.8 % — ABNORMAL LOW (ref 39.0–52.0)
Hemoglobin: 7.4 g/dL — ABNORMAL LOW (ref 13.0–17.0)
Immature Granulocytes: 1 %
Lymphocytes Relative: 1 %
Lymphs Abs: 0.2 10*3/uL — ABNORMAL LOW (ref 0.7–4.0)
MCH: 28.4 pg (ref 26.0–34.0)
MCHC: 31.1 g/dL (ref 30.0–36.0)
MCV: 91.2 fL (ref 80.0–100.0)
Monocytes Absolute: 0.9 10*3/uL (ref 0.1–1.0)
Monocytes Relative: 5 %
Neutro Abs: 19.6 10*3/uL — ABNORMAL HIGH (ref 1.7–7.7)
Neutrophils Relative %: 93 %
Platelets: 365 10*3/uL (ref 150–400)
RBC: 2.61 MIL/uL — ABNORMAL LOW (ref 4.22–5.81)
RDW: 14.2 % (ref 11.5–15.5)
WBC: 20.8 10*3/uL — ABNORMAL HIGH (ref 4.0–10.5)
nRBC: 0 % (ref 0.0–0.2)

## 2023-01-30 LAB — GLUCOSE, CAPILLARY
Glucose-Capillary: 241 mg/dL — ABNORMAL HIGH (ref 70–99)
Glucose-Capillary: 344 mg/dL — ABNORMAL HIGH (ref 70–99)
Glucose-Capillary: 246 mg/dL — ABNORMAL HIGH (ref 70–99)
Glucose-Capillary: 41 mg/dL — CL (ref 70–99)
Glucose-Capillary: 200 mg/dL — ABNORMAL HIGH (ref 70–99)

## 2023-01-30 LAB — BASIC METABOLIC PANEL
Anion gap: 11 (ref 5–15)
BUN: 27 mg/dL — ABNORMAL HIGH (ref 8–23)
CO2: 24 mmol/L (ref 22–32)
Calcium: 9 mg/dL (ref 8.9–10.3)
Chloride: 100 mmol/L (ref 98–111)
Creatinine, Ser: 1.37 mg/dL — ABNORMAL HIGH (ref 0.61–1.24)
GFR, Estimated: 54 mL/min — ABNORMAL LOW (ref >60)
Glucose, Bld: 271 mg/dL — ABNORMAL HIGH (ref 70–99)
Potassium: 3.9 mmol/L (ref 3.5–5.1)
Sodium: 135 mmol/L (ref 135–145)

## 2023-01-30 LAB — MAGNESIUM: Magnesium: 1.9 mg/dL (ref 1.7–2.4)

## 2023-01-30 LAB — PROCALCITONIN: Procalcitonin: 7.15 ng/mL

## 2023-01-30 LAB — C-REACTIVE PROTEIN: CRP: 17.9 mg/dL — ABNORMAL HIGH (ref <1.0)

## 2023-01-30 LAB — BRAIN NATRIURETIC PEPTIDE: B Natriuretic Peptide: 902.9 pg/mL — ABNORMAL HIGH (ref 0.0–100.0)

## 2023-01-30 MED ORDER — INSULIN GLARGINE-YFGN 100 UNIT/ML ~~LOC~~ SOLN
12.0000 [IU] | Freq: Every day | SUBCUTANEOUS | Status: DC
  Administered 2023-01-30: 12 [IU] via SUBCUTANEOUS
  Filled 2023-01-30: qty 0.12

## 2023-01-30 MED ORDER — INSULIN ASPART 100 UNIT/ML IJ SOLN
0.0000 [IU] | INTRAMUSCULAR | Status: DC
  Administered 2023-01-30: 11 [IU] via SUBCUTANEOUS
  Administered 2023-01-30: 5 [IU] via SUBCUTANEOUS

## 2023-01-30 MED ORDER — FUROSEMIDE 10 MG/ML IJ SOLN
INTRAMUSCULAR | Status: AC
  Filled 2023-01-30: qty 4

## 2023-01-30 MED ORDER — DILTIAZEM HCL-DEXTROSE 125-5 MG/125ML-% IV SOLN (PREMIX)
5.0000 mg/h | INTRAVENOUS | Status: DC
  Administered 2023-01-30: 5 mg/h via INTRAVENOUS
  Filled 2023-01-30 (×2): qty 125

## 2023-01-30 MED ORDER — MAGNESIUM HYDROXIDE 400 MG/5ML PO SUSP: 30.0000 mL | Freq: Two times a day (BID) | ORAL | Status: AC

## 2023-01-30 MED ORDER — HALOPERIDOL LACTATE 5 MG/ML IJ SOLN: 0.5000 mg | INTRAMUSCULAR | Status: DC | PRN

## 2023-01-30 MED ORDER — DEXTROSE 50 % IV SOLN
INTRAVENOUS | Status: AC
  Administered 2023-01-30: 25 g via INTRAVENOUS
  Filled 2023-01-30: qty 50

## 2023-01-30 MED ORDER — FUROSEMIDE 10 MG/ML IJ SOLN
20.0000 mg | Freq: Once | INTRAMUSCULAR | Status: AC
  Administered 2023-01-30: 20 mg via INTRAVENOUS
  Filled 2023-01-30: qty 2

## 2023-01-30 MED ORDER — BIOTENE DRY MOUTH MT LIQD: 15.0000 mL | OROMUCOSAL | Status: DC | PRN

## 2023-01-30 MED ORDER — POLYETHYLENE GLYCOL 3350 17 G PO PACK: 17.0000 g | PACK | Freq: Every day | ORAL | Status: DC | PRN

## 2023-01-30 MED ORDER — INSULIN GLARGINE-YFGN 100 UNIT/ML ~~LOC~~ SOLN: 14.0000 [IU] | Freq: Every day | SUBCUTANEOUS | Status: DC

## 2023-01-30 MED ORDER — GLYCOPYRROLATE 1 MG PO TABS: 1.0000 mg | ORAL_TABLET | ORAL | Status: DC | PRN

## 2023-01-30 MED ORDER — LORAZEPAM 2 MG/ML IJ SOLN
1.0000 mg | INTRAMUSCULAR | Status: DC | PRN
  Administered 2023-01-31 – 2023-02-01 (×2): 1 mg via INTRAVENOUS
  Filled 2023-01-30 (×2): qty 1

## 2023-01-30 MED ORDER — BISACODYL 5 MG PO TBEC: 10.0000 mg | DELAYED_RELEASE_TABLET | Freq: Every day | ORAL | Status: DC

## 2023-01-30 MED ORDER — LORAZEPAM 2 MG/ML PO CONC
1.0000 mg | ORAL | Status: DC | PRN
  Filled 2023-01-30: qty 0.5

## 2023-01-30 MED ORDER — DILTIAZEM HCL 60 MG PO TABS
90.0000 mg | ORAL_TABLET | Freq: Three times a day (TID) | ORAL | Status: DC
  Administered 2023-01-30: 90 mg via ORAL
  Filled 2023-01-30 (×2): qty 1

## 2023-01-30 MED ORDER — GLYCOPYRROLATE 0.2 MG/ML IJ SOLN
0.2000 mg | INTRAMUSCULAR | Status: DC | PRN
  Administered 2023-02-01: 0.2 mg via INTRAVENOUS
  Filled 2023-01-30 (×2): qty 1

## 2023-01-30 MED ORDER — DILTIAZEM LOAD VIA INFUSION
10.0000 mg | Freq: Once | INTRAVENOUS | Status: AC
  Administered 2023-01-30: 10 mg via INTRAVENOUS
  Filled 2023-01-30: qty 10

## 2023-01-30 MED ORDER — MORPHINE SULFATE (PF) 2 MG/ML IV SOLN
1.0000 mg | INTRAVENOUS | Status: DC | PRN
  Administered 2023-01-30 – 2023-02-01 (×7): 2 mg via INTRAVENOUS
  Filled 2023-01-30 (×7): qty 1

## 2023-01-30 MED ORDER — DEXTROSE 50 % IV SOLN: 25.0000 g | INTRAVENOUS | Status: AC

## 2023-01-30 MED ORDER — LACTATED RINGERS IV SOLN: INTRAVENOUS | Status: AC

## 2023-01-30 MED ORDER — DEXTROSE IN LACTATED RINGERS 5 % IV SOLN: INTRAVENOUS | Status: DC

## 2023-01-30 MED ORDER — DILTIAZEM HCL 25 MG/5ML IV SOLN
10.0000 mg | Freq: Once | INTRAVENOUS | Status: AC
  Administered 2023-01-30: 10 mg via INTRAVENOUS
  Filled 2023-01-30: qty 5

## 2023-01-30 MED ORDER — POTASSIUM CHLORIDE CRYS ER 20 MEQ PO TBCR
40.0000 meq | EXTENDED_RELEASE_TABLET | Freq: Once | ORAL | Status: DC
  Filled 2023-01-30: qty 2

## 2023-01-30 MED ORDER — LORAZEPAM 1 MG PO TABS: 1.0000 mg | ORAL_TABLET | ORAL | Status: DC | PRN

## 2023-01-30 MED ORDER — BISACODYL 10 MG RE SUPP: 10.0000 mg | Freq: Every day | RECTAL | Status: DC

## 2023-01-30 MED ORDER — LACTULOSE 10 GM/15ML PO SOLN: 20.0000 g | Freq: Two times a day (BID) | ORAL | Status: DC

## 2023-01-30 MED ORDER — MORPHINE SULFATE (CONCENTRATE) 10 MG/0.5ML PO SOLN: 5.0000 mg | ORAL | Status: DC | PRN

## 2023-01-30 MED ORDER — FUROSEMIDE 10 MG/ML IJ SOLN: 20.0000 mg | Freq: Once | INTRAMUSCULAR | Status: DC

## 2023-01-30 MED ORDER — FUROSEMIDE 10 MG/ML IJ SOLN: 40.0000 mg | Freq: Once | INTRAMUSCULAR | Status: DC

## 2023-01-30 MED ORDER — HALOPERIDOL 1 MG PO TABS: 0.5000 mg | ORAL_TABLET | ORAL | Status: DC | PRN

## 2023-01-30 MED ORDER — BISACODYL 10 MG RE SUPP: 10.0000 mg | Freq: Every day | RECTAL | Status: DC | PRN

## 2023-01-30 MED ORDER — CHLORHEXIDINE GLUCONATE CLOTH 2 % EX PADS
6.0000 | MEDICATED_PAD | Freq: Every day | CUTANEOUS | Status: DC
  Administered 2023-01-30: 6 via TOPICAL

## 2023-01-30 MED ORDER — GLYCOPYRROLATE 0.2 MG/ML IJ SOLN: 0.2000 mg | INTRAMUSCULAR | Status: DC | PRN

## 2023-01-30 MED ORDER — HALOPERIDOL LACTATE 2 MG/ML PO CONC: 0.5000 mg | ORAL | Status: DC | PRN

## 2023-01-30 MED ORDER — POLYVINYL ALCOHOL 1.4 % OP SOLN: 1.0000 [drp] | Freq: Four times a day (QID) | OPHTHALMIC | Status: DC | PRN

## 2023-01-30 NOTE — Progress Notes (Signed)
S/p right AKA  Healing well. Pain significantly improved.    Victorino Sparrow MD

## 2023-01-30 NOTE — Progress Notes (Signed)
RT called to patient's room by MD for a PRN treatment, suctioning, and to start CPT. Patient's sats 74% on Salter [place] 5 L. Increased to 13L and started albuterol treatment at 0904. Patient's sats increased up to 92% but fell back to low 70s. RT asked RN to get a NRB mask. Patient has diminished coarse BS. RT preformed NTS on the patient but did not obtain any secretions. RT preformed oral tracheal suctioning and again did not obtain any secretions. Patient's sats dropped to 66%, NRB mask applied at 15L sats increased to low 70's with a good pulse ox pleth. RT asked RN to call Rapid Response RN and MD. Bipap started on patient, 12 / 5, 100%. No need for ABG per MD at this time. RT will continue to monitor. CPT scheduled to start at 1200.

## 2023-01-30 NOTE — Progress Notes (Signed)
Rapid response called on pt requested by RT. Pt hypoxic, spO2 in the lower 60s. MD made aware.

## 2023-01-30 NOTE — Progress Notes (Addendum)
PROGRESS NOTE        PATIENT DETAILS Name: Reginald Ayers Age: 72 y.o. Sex: male Date of Birth: [birth year] Admit Date: 01/14/2023 Admitting Physician Jonah Blue, MD ZOX:[id number], Selena Batten, NP  Brief Summary: Patient is a 72 y.o.  male COPD, CAD s/p CABG, PAD s/p stent, HLD, HTN-who presented with weakness.  Per H&P-patient was found at home with bedbugs, and covered in fecal matter.   Significant events: 5/23>> admit to [facility]-weakness/nausea/vomiting-found covered in fecal matter/bedbugs. 5/24>> MRCP with choledocholithiasis 5/28>> ERCP-sphincterotomy 5/29>> evaluation by cardiology-high risk for cholecystectomy 5/30>> General surgery signed off-no plans to pursue cholecystectomy. 6/01>> right foot toes-purple-right foot is cold.  Vascular surgery consulted.  Significant studies: 5/23>> MRI brain: No acute abnormality-chronically occluded left ICA 5/23>> CT abdomen/pelvis: Mild groundglass density in the right lower lobe, distended gallbladder  without calcified stone, dilated CBD measuring up to 19 mm with suspicion for intrahepatic dilatation. 5/24>> MRCP:Severe intrahepatic/extrahepatic biliary ductal dilatation-tiny gallstones/sludge within the cystic duct near the ampulla.  Significant microbiology data: None  Procedures: 5/28>> ERCP-sphincterotomy 01/27/2023.  Right AKA by vascular surgery  Consults: GI General surgery Cardiology Vascular surgery  Subjective:   Patient in bed appears to be mildly short of breath, denies any headache, says he is in no discomfort today no, denies any chest or abdominal pain, no nausea. Mild stump pain better.   Objective: Vitals: Blood pressure (!) 129/54, pulse (!) 108, temperature 98.6 F (37 C), temperature source Oral, resp. rate 20, height 5\' 1"  (1.549 m), weight 43.5 kg, SpO2 98 %.   Exam:  Awake Alert, No new F.N deficits, Foley catheter in place, [place].AT,PERRAL Supple Neck, No JVD,   Symmetrical Chest  wall movement, Good air movement bilaterally, coarse bilateral breath sounds RRR,No Gallops, Rubs or new Murmurs,  +ve B.Sounds, Abd Soft, No tenderness,   Right AKA stump site stable   Assessment/Plan:  Addendum seen the patient again afternoon of 01/30/2023, and respiratory distress now on BiPAP, gentle Lasix, continue antibiotics, aspiration precautions, BiPAP, he is DNR, will involve palliative care as he has multiple comorbidities and seem to be declining gradually despite appropriate care.     Weakness/nausea/vomiting - Dilated bile ducts secondary to choledocholithiasis -  Poor historian-unclear etiology-given MRCP findings-concerned that his clinical presentation on admission could have been due to a passed CBD stone/symptomatic choledocholithiasis. He is s/p ERCP on 5/28-not felt to be a good candidate for cholecystectomy given high risk profile. Nausea/vomiting has resolved-he is tolerating regular diet.  Anion gap metabolic acidosis  - Likely starvation ketoacidosis-due to poor intake/possible vomiting-in the setting of possible passed CBD stone. Improving with supportive care  PAD, Right foot ischemic foot - Has had numerous procedures in the past-follow with Dr. Myra Gianotti, Prior to this hospitalization he was on aspirin/Plavix/Eliquis - [facility] following, continue IV heparin and Plavix, per vascular surgery this is likely due to his chronic PAD getting worse, seen by vascular surgery and underwent AKA on 01/27/2023, continue pain control, agreeable for transfusion if needed.  Constipation related nausea vomiting with aspiration pneumonia on 01/29/2023.  Placed on Unasyn, clear liquid diet, bowel regimen for constipation, oxygen, nebulizer treatment, chest PT with suctioning, discussed with RT and RN.  Clear liquid diet for now.  Speech to evaluate.    History of CVA No obvious deficits on exam-MRI brain negative Continue statin See below regarding antiplatelets.  PAF  In RVR on 01/30/2023,  Cardizem drip, oral Cardizem overlap, Eliquis  AKI Mild Continue to monitor closely.  Urinary retention.  Foley and Flomax started on 01/29/2023.   Hyponatremia SIADH.  Resolved after [place]  HTN BP better controlled with amlodipine/metoprolol.  CAD-s/p CABG No anginal symptoms See below regarding antiplatelets. Continue statin/beta-blocker/antiplatelets  Constipation.  Placed on bowel regimen.    Acute toxic metabolic encephalopathy Somewhat hard to arouse this morning-but much more easily aroused this afternoon.  ABG stable-nonfocal exam. Suspect this may be due to gabapentin-will discontinue and monitor closely.  COPD Stable Bronchodilators    Debility/deconditioning Failure to thrive Secondary to acute illness-frailty at baseline [facility] refuses SNF-wants to go home with home health.  Understands risk of rehospitalization/decompensation.  Claims lives with 2 roommates-and has 7 dogs that he misses.  DM-2 CBGs on the higher side-but due to lingering encephalopathy today-will not adjust insulin doses-as oral intake is poor Continue Semglee 14 units daily, 4 units of NovoLog with send SSI. Watch closely-he is very frail-and is not a candidate for aggressive glycemic control.    Recent Labs    01/29/23 2142 01/30/23 0109 01/30/23 0855  GLUCAP 214* 241* 344*    Code status:   Code Status: DNR   DVT Prophylaxis: IV heparin gtt apixaban (ELIQUIS) tablet 5 mg   Family Communication: Niece-[name] [phone]-left VM on 5/25, 5/26,5/27   Disposition Plan:   The patient will require care spanning > 2 midnights and should be moved to inpatient because: Severity of illness.   Planned Discharge Destination:Home health   Diet: Diet Order             Diet clear liquid Room service appropriate? Yes; Fluid consistency: Thin  Diet effective now                     Antimicrobial agents: Anti-infectives (From admission, onward)    Start      Dose/Rate Route Frequency Ordered Stop   01/29/23 1530  Ampicillin-Sulbactam (UNASYN) 3 g in sodium chloride 0.9 % 100 mL IVPB        3 g 200 mL/hr over 30 Minutes Intravenous Every 6 hours 01/29/23 1444     01/19/23 1130  ciprofloxacin (CIPRO) IVPB 400 mg        400 mg 200 mL/hr over 60 Minutes Intravenous  Once 01/19/23 1037 01/19/23 1230   01/14/23 1745  cefTRIAXone (ROCEPHIN) 1 g in sodium chloride 0.9 % 100 mL IVPB        1 g 200 mL/hr over 30 Minutes Intravenous  Once 01/14/23 1741 01/14/23 1922   01/14/23 1745  azithromycin (ZITHROMAX) 500 mg in sodium chloride 0.9 % 250 mL IVPB        500 mg 250 mL/hr over 60 Minutes Intravenous  Once 01/14/23 1741 01/14/23 2315        MEDICATIONS: Scheduled Meds:   stroke: early stages of recovery book   Does not apply Once   apixaban  5 mg Oral BID   bisacodyl  10 mg Oral Q0600   bisacodyl  10 mg Rectal Daily   bisacodyl  10 mg Rectal Daily   Chlorhexidine Gluconate Cloth  6 each Topical Daily   clopidogrel  75 mg Oral Daily   diltiazem  10 mg Intravenous Once   diltiazem  90 mg Oral Q8H   feeding supplement  237 mL Oral TID BM   furosemide  40 mg Intravenous Once   insulin aspart  0-15 Units Subcutaneous Q4H  insulin aspart  3 Units Subcutaneous TID WC   [START ON 01/31/2023] insulin glargine-yfgn  12 Units Subcutaneous Daily   lactulose  20 g Oral BID   magnesium hydroxide  30 mL Oral BID   multivitamin with minerals  1 tablet Oral Daily   pantoprazole  40 mg Oral Daily   polyethylene glycol  17 g Oral Daily   rosuvastatin  20 mg Oral Daily   tamsulosin  0.4 mg Oral Daily   umeclidinium bromide  1 puff Inhalation QHS   Continuous Infusions:  ampicillin-sulbactam (UNASYN) IV 3 g (01/30/23 0433)   diltiazem (CARDIZEM) infusion     PRN Meds:.acetaminophen, albuterol, alum & mag hydroxide-simeth, diltiazem, hydrALAZINE, HYDROcodone-acetaminophen, ondansetron (ZOFRAN) IV, traMADol   I have personally reviewed following labs and  imaging studies  LABORATORY DATA:  Recent Labs  Lab 01/26/23 0233 01/27/23 0643 01/28/23 0725 01/29/23 0624 01/30/23 0243  WBC 11.7* 12.3* 14.6* 18.8* 20.8*  HGB 8.7* 8.2* 8.7* 8.8* 7.4*  HCT 27.4* 26.2* 27.9* 28.1* 23.8*  PLT 352 343 375 410* 365  MCV 87.8 91.0 93.3 92.1 91.2  MCH 27.9 28.5 29.1 28.9 28.4  MCHC 31.8 31.3 31.2 31.3 31.1  RDW 14.2 14.3 14.3 14.3 14.2  LYMPHSABS  --   --  0.3* 0.5* 0.2*  MONOABS  --   --  1.0 1.2* 0.9  EOSABS  --   --  0.0 0.0 0.0  BASOSABS  --   --  0.0 0.0 0.0    Recent Labs  Lab 01/24/23 0341 01/25/23 0600 01/26/23 0233 01/27/23 0643 01/28/23 0725 01/29/23 0624 01/30/23 0243  NA 129* 127* 128* 132* 133* 130* 135  K 4.7 4.8 4.9 4.1 4.0 3.7 3.9  CL 94* 94* 92* 99 99 96* 100  CO2 25 24 26 22 23  21* 24  ANIONGAP 10 9 10 11 11 13 11   GLUCOSE 187* 181* 145* 125* 209* 250* 271*  BUN 28* 22 28* 19 17 26* 27*  CREATININE 1.12 1.19 1.26* 1.07 1.12 1.16 1.37*  CRP 2.6* 3.6* 5.2* 8.6*  --   --  17.9*  PROCALCITON 0.31 0.16 0.41 0.18 0.14  --  7.15  BNP 264.5* 329.0* 223.8* 314.1* 1,121.9* 414.3* 902.9*  MG 1.8 1.6* 1.8 1.8 2.0 1.7 1.9  CALCIUM 8.9 9.2 9.2 8.7* 8.9 9.0 9.0    Lab Results  Component Value Date   CHOL 127 01/15/2023   HDL 28 (L) 01/15/2023   LDLCALC 63 01/15/2023   TRIG 179 (H) 01/15/2023   CHOLHDL 4.5 01/15/2023      Recent Labs  Lab 01/24/23 0341 01/25/23 0600 01/26/23 0233 01/27/23 0643 01/28/23 0725 01/29/23 0624 01/30/23 0243  CRP 2.6* 3.6* 5.2* 8.6*  --   --  17.9*  PROCALCITON 0.31 0.16 0.41 0.18 0.14  --  7.15  BNP 264.5* 329.0* 223.8* 314.1* 1,121.9* 414.3* 902.9*  MG 1.8 1.6* 1.8 1.8 2.0 1.7 1.9  CALCIUM 8.9 9.2 9.2 8.7* 8.9 9.0 9.0   MICROBIOLOGY: No results found for this or any previous visit (from the past 240 hour(s)).  RADIOLOGY STUDIES/RESULTS: DG Abd Portable 1V  Result Date: 01/30/2023 CLINICAL DATA:  Nausea, weakness EXAM: PORTABLE ABDOMEN - 1 VIEW COMPARISON:  01/29/2023 abdominal  radiograph FINDINGS: Intact lower sternotomy wires. Large colonic stool volume, unchanged. A few mildly dilated small bowel loops, unchanged. No evidence of pneumatosis or pneumoperitoneum. Moderate lumbar spondylosis. IMPRESSION: Stable mildly dilated small bowel loops and large colonic stool volume, favoring ileus/constipation. Electronically Signed   By: Barbara Cower  A Poff M.D.   On: 01/30/2023 08:09   DG Chest Port 1 View  Result Date: 01/30/2023 CLINICAL DATA:  Dyspnea EXAM: PORTABLE CHEST 1 VIEW COMPARISON:  Chest radiograph from one day prior. FINDINGS: Intact sternotomy wires. CABG clips overlie the left mediastinum. Stable cardiomediastinal silhouette with normal heart size. No pneumothorax. Trace right pleural effusion is stable. Patchy left lung base consolidation with probable trace left pleural effusion, worsened. No pulmonary edema. IMPRESSION: 1. Patchy left lung base consolidation with probable trace left pleural effusion, worsened. Findings suspicious for pneumonia as seen on CT abdomen from 1 day prior. 2. Stable trace right pleural effusion. Electronically Signed   By: Delbert Phenix M.D.   On: 01/30/2023 08:07   CT ABDOMEN PELVIS WO CONTRAST  Result Date: 01/29/2023 CLINICAL DATA:  Bowel obstruction suspected. EXAM: CT ABDOMEN AND PELVIS WITHOUT CONTRAST TECHNIQUE: Multidetector CT imaging of the abdomen and pelvis was performed following the standard protocol without IV contrast. RADIATION DOSE REDUCTION: This exam was performed according to the departmental dose-optimization program which includes automated exposure control, adjustment of the mA and/or kV according to patient size and/or use of iterative reconstruction technique. COMPARISON:  01/14/2023 FINDINGS: Lower chest: Interval development of dense left lower lobe collapse/consolidation with air bronchograms. Progression of airspace disease in the right lower lobe evident with some confluent/consolidative airspace disease on the current  exam. Hepatobiliary: No suspicious focal abnormality in the liver on this study without intravenous contrast. There is no evidence for gallstones, gallbladder wall thickening, or pericholecystic fluid. Common bile duct measures up to 13 mm diameter, stable to minimally decreased in the interval. Pancreas: No evidence for main duct dilatation. Spleen: No splenomegaly. No focal mass lesion. Adrenals/Urinary Tract: No adrenal nodule or mass. Probable punctate nonobstructing stone lower pole right kidney. Left kidney unremarkable. No evidence for hydroureter. Urinary bladder decompressed by Foley catheter. Stomach/Bowel: Stomach is markedly distended with gas and fluid. There is diffuse gaseous distention of small bowel measuring up to 3 cm diameter. Some small bowel loops in the pelvis appear decompressed. Large stool volume noted in the colon with diffuse gaseous distension of colon. Gas and stool is seen in the left colon down to the level of the rectum. Vascular/Lymphatic: There is advanced atherosclerotic calcification of the abdominal aorta without aneurysm. There is no gastrohepatic or hepatoduodenal ligament lymphadenopathy. No retroperitoneal or mesenteric lymphadenopathy. No pelvic sidewall lymphadenopathy. Reproductive: The prostate gland and seminal vesicles are unremarkable. Other: No intraperitoneal free fluid. Musculoskeletal: Diffuse body wall edema evident. No worrisome lytic or sclerotic osseous abnormality. IMPRESSION: 1. Interval development of dense left lower lobe collapse/consolidation with air bronchograms. Progression of airspace disease in the right lower lobe evident with some confluent/consolidative airspace disease on the current exam. Imaging features compatible with multifocal pneumonia. 2. Marked gaseous distension of the stomach with diffuse gaseous distension of small bowel and colon. Some small bowel loops in the pelvis appear decompressed. Given the relatively large volume of stool in  the colon down to the level of the rectum, ileus is favored as etiology for the appearance on today's study. Distal small bowel obstruction cannot be definitively excluded. 3. Diffuse body wall edema. 4.  Aortic Atherosclerosis (ICD10-I70.0). Electronically Signed   By: Kennith Center M.D.   On: 01/29/2023 17:26   DG Abd Portable 1V  Result Date: 01/29/2023 CLINICAL DATA:  Nausea and vomiting EXAM: PORTABLE ABDOMEN - 1 VIEW COMPARISON:  01/14/2023 FINDINGS: Prominent stool throughout the colon favors constipation. No definite dilated small bowel. Atherosclerosis  is present, including aortoiliac atherosclerotic disease. Left common iliac artery stent. Mild to moderate degenerative hip arthropathy bilaterally. IMPRESSION: 1. Prominent stool throughout the colon favors constipation. 2. Aortoiliac atherosclerosis. 3. Mild to moderate degenerative hip arthropathy bilaterally. Electronically Signed   By: Gaylyn Rong M.D.   On: 01/29/2023 15:01   DG Chest Port 1 View  Result Date: 01/29/2023 CLINICAL DATA:  Shortness of breath. EXAM: PORTABLE CHEST 1 VIEW COMPARISON:  01/14/2023 FINDINGS: The lungs are clear without focal pneumonia, edema, pneumothorax or pleural effusion. Interstitial markings are diffusely coarsened with chronic features. Chronic atelectasis or scarring noted at the bases. The cardiopericardial silhouette is within normal limits for size. Bones are diffusely demineralized. Telemetry leads overlie the chest. Gaseous distention of stomach and bowel noted in the visualized upper abdomen. IMPRESSION: Chronic interstitial coarsening without acute cardiopulmonary findings. Gaseous distention of stomach and bowel within the visualized upper abdomen. Electronically Signed   By: Kennith Center M.D.   On: 01/29/2023 14:19     LOS: 15 days   Signature  -    Susa Raring M.D on 01/30/2023 at 9:02 AM   -  To page go to [email or web address]

## 2023-01-30 NOTE — Progress Notes (Signed)
RT came to bedside. Patient saying take it off referring to the Bipap mask. RN notified patient coming off Bipap. RT placed patient on [place] 8L with humidity, sats 95%. RT started manual CPT but patient started yelling "stop".

## 2023-01-30 NOTE — Progress Notes (Signed)
RT came to bedside to attempt CPT. RN stated patient is comfort care with a morphine drip started.

## 2023-01-30 NOTE — Evaluation (Signed)
SLP Cancellation Note  Patient Details Name: ELLISON RIETH MRN: [id number] DOB: 24-May-1951   Cancelled treatment:       Reason Eval/Treat Not Completed: Other (comment) (pt npo at this time, ? for potential procedure, will continue efforts)   [name] 01/30/2023, 9:47 AM   Rolena Infante, MS [facility] SLP Acute Rehab Services Office [phone]

## 2023-01-30 NOTE — Consult Note (Signed)
Palliative Care Consult Note                                  Date: 01/30/2023   Patient Name: Reginald Ayers  DOB: [birth year]  MRN: [id number]  Age / Sex: 72 y.o., male  PCP: Loura Back, NP Referring Physician: Leroy Sea, MD  Reason for Consultation: Establishing goals of care  HPI/Patient Profile: 72 y.o. male  with past medical history of COPD, CAD s/p CABG, PAD s/p stent, HLD, and HTN who presented to the ED on 01/14/2023 with weakness, nausea, and vomiting.  Per H&P, patient was found at home with bedbugs and covered in fecal matter. MRCP on 5/23 showed choledocholelithiasis, however he was ultimately deemed too high risk for cholecystectomy.  On 6/1, there was concern for ischemic changes to his right toes and foot, he was evaluated by vascular surgery, and underwent right AKA on 6/5.  Past Medical History:  Diagnosis Date   Carotid artery disease (HCC)    L-ICA 100%, mod R-ICA dz   COPD (chronic obstructive pulmonary disease) (HCC)    Coronary artery disease    s/p CABG February 2010 by Dr. Andrey Spearman   Diabetes [facility])    Hyperlipidemia    Hypertension    Peripheral arterial disease (HCC)    post left common iliac and right SFA stenting remotely   Tobacco abuse         Subjective:   I have reviewed medical records including progress notes, labs and imaging.  Note patient had a hypoxic episode this morning and a rapid response was called.  Patient was placed on BiPAP, but several hours later he became agitated and refused to wear it.  I assessed the patient at bedside. He is extremely hard of hearing and communication was very challenging. I spent prolonged time at the bedside attempting to discuss goals of care. He is not able to tell me much about his current medical issues, other than stating "they cut off my leg". I ended up having to write down statements about his current medical condition, including that he  has pneumonia and is not breathing well. Patient states "I'm dying" multiple times throughout our discussion. I then wrote that is he agrees, we could keep him comfortable and allow him to pass with peace and dignity. Patient seems to indicate he agrees with comfort care, but it is unclear whether he has capacity to make this decision independently.  __________________________________________________  I spoke with niece/[name] by phone to discuss diagnosis, prognosis, GOC, EOL wishes, disposition, and options. [name] confirms that she is patient's next of kin; he is not married and does not have children or living siblings.   I introduced Palliative Medicine as specialized medical care for people living with serious illness. It focuses on providing relief from the symptoms and stress of a serious illness.   We discussed patient's current illness and what it means in the larger context of his ongoing co-morbidities. Current clinical status was reviewed. I shared my concern that patient is at end-of-life. [name] verbalizes understanding and states she is not surprised by this.   The difference between full scope medical intervention and comfort care was considered. Discussed with [name] the concept of comfort care, emphasizing this involves de-escalating and stopping full scope medical interventions, allowing a natural course to occur. Discussed that the goal is comfort and dignity rather than cure/prolonging life. Discussed specifics  of transitioning to comfort care and what that would look like--keeping him clean and dry, no labs, no artificial hydration or feeding, no antibiotics, minimizing of medications, and medication for pain and dyspnea.   [name] agrees with transition to comfort care at this time. She also requests a referral to [facility] hospice house, as this is very close to where she lives.   I also went back to bedside and called [name] from patient's phone so she could  speak with him. He agrees with referral to the hospice facility.    Review of Systems  Respiratory:  Positive for shortness of breath.     Objective:   Primary Diagnoses: Present on Admission:  Increased anion gap metabolic acidosis  Stage 3a chronic kidney disease (HCC)  Peripheral arterial disease (HCC)  Essential hypertension  COPD (chronic obstructive pulmonary disease) (HCC)  DNR (do not resuscitate)   Physical Exam Vitals reviewed.  Constitutional:      General: He is in acute distress.     Appearance: He is ill-appearing.  HENT:     Head:     Comments: Very hard of hearing Cardiovascular:     Rate and Rhythm: Rhythm irregularly irregular.     Comments: A-fib Pulmonary:     Effort: Tachypnea and accessory muscle usage present.     Breath sounds: Rales present.  Musculoskeletal:     Right Lower Extremity: Right leg is amputated above knee.  Neurological:     Mental Status: He is alert.  Psychiatric:        Mood and Affect: Affect is angry.     Vital Signs:  BP 133/81   Pulse 99   Temp 98.8 F (37.1 C) (Axillary)   Resp (!) 23   Ht 5\' 1"  (1.549 m)   Wt 43.5 kg   SpO2 96%   BMI 18.12 kg/m   Palliative Assessment/Data: PPS 20%     Assessment & Plan:   SUMMARY OF RECOMMENDATIONS   Transition to full comfort measures D/C labs, cardiac monitoring, CBG monitoring Minimize medications Continue antibiotics for now (niece understands this will be discontinued on transfer) Referral to [facility] hosp[facility] PMT will continue to follow  Primary Decision Maker: Unclear if patient has capacity to make complex medical decisions. His next-of-kin is his niece Engineer, drilling  Code Status/Advance Care Planning: DNR  Symptom Management:  Morphine prn for pain or dyspnea Lorazepam (ATIVAN) prn for anxiety Haloperidol (HALDOL) prn for agitation  Glycopyrrolate (ROBINUL) for excessive secretions Ondansetron (ZOFRAN) prn for  nausea Polyvinyl alcohol (LIQUIFILM TEARS) prn for dry eyes Antiseptic oral rinse (BIOTENE) prn for dry mouth   Prognosis:  < 2 weeks  Discharge Planning:  To Be Determined   Discussed with: Dr. Thedore Mins, RN, [facility]   Thank you for allowing the Palliative Medicine Team to assist in the care of this patient.   Greater than 50%  of this time was spent counseling and coordinating care related to the above assessment and plan.  Total time: 110 minutes   Merry Proud, NP Palliative Medicine   Please contact Palliative Medicine Team phone at [phone] for questions and concerns.  For individual provider, see AMION.

## 2023-01-31 DIAGNOSIS — R29898 Other symptoms and signs involving the musculoskeletal system: Secondary | ICD-10-CM

## 2023-01-31 DIAGNOSIS — Z789 Other specified health status: Secondary | ICD-10-CM

## 2023-01-31 DIAGNOSIS — Z515 Encounter for palliative care: Secondary | ICD-10-CM

## 2023-01-31 DIAGNOSIS — R4781 Slurred speech: Secondary | ICD-10-CM

## 2023-01-31 LAB — GLUCOSE, CAPILLARY: Glucose-Capillary: 105 mg/dL — ABNORMAL HIGH (ref 70–99)

## 2023-01-31 NOTE — TOC Progression Note (Signed)
Transition of Care [facility]) - Progression Note    Patient Details  Name: Reginald Ayers MRN: [id number] Date of Birth: May 09, 1951  Transition of Care [facility]) CM/SW Contact  Donnalee Curry, LCSWA Phone Number: 01/31/2023, 8:54 AM  Clinical Narrative:     SW informed by palliative care Amil Amen, NP, pt/niece want CC and requested referral to [facility].   SW spoke with [name] [phone]) confirmed she wants referral to [facility])  SW spoke with [name] [phone]) provided referral information.    Expected Discharge Plan: Skilled Nursing Facility Barriers to Discharge: Continued Medical Work up  Expected Discharge Plan and Services In-house Referral: Clinical Social Work   Post Acute Care Choice: Skilled Nursing Facility Living arrangements for the past 2 months: Boarding House                                       Social Determinants of Health (SDOH) Interventions SDOH Screenings   Housing: Medium Risk (01/15/2023)  Tobacco Use: High Risk (01/28/2023)    Readmission Risk Interventions    01/12/2022   10:29 AM  Readmission Risk Prevention Plan  Transportation Screening Complete  PCP or Specialist Appt within 3-5 Days Complete  HRI or Home Care Consult Complete  Social Work Consult for Recovery Care Planning/Counseling Complete  Palliative Care Screening Not Applicable  Medication Review Oceanographer) Complete

## 2023-01-31 NOTE — Progress Notes (Signed)
SLP Cancellation Note  Patient Details Name: Reginald Ayers MRN: [id number] DOB: 1950/09/19   Cancelled treatment:       Reason Eval/Treat Not Completed: Other (comment) (Patient is now comfort measures. MD stating order can be disontinued.  If we can be of further assistance please reconsult.)   Dimas Aguas, MA, CCC-SLP Acute Rehab SLP [phone]   Fleet Contras 01/31/2023, 8:17 AM

## 2023-01-31 NOTE — Progress Notes (Addendum)
Daily Progress Note   Patient Name: Reginald Ayers       Date: 01/31/2023 DOB: [birth year]  Age: 72 y.o. MRN#: [id number] Attending Physician: Leroy Sea, MD Primary Care Physician: Loura Back, NP Admit Date: 01/14/2023  Reason for Consultation/Follow-up: Non pain symptom management, Pain control, Psychosocial/spiritual support, and Terminal Care  Subjective: I have reviewed medical records including EPIC notes, MAR, and labs. Received report from primary RN - no acute concerns. RN reports patient is not taking oral medications; poor PO intake.  Went to visit patient at bedside - no family/visitors present. Patient was lying in bed asleep - I did not attempt to wake him to preserve comfort. No signs or non-verbal gestures of pain or discomfort noted. No respiratory distress, increased work of breathing, or secretions noted. He is on 8L O2 [place].    Length of Stay: 16  Current Medications: Scheduled Meds:    stroke: early stages of recovery book   Does not apply Once   apixaban  5 mg Oral BID   bisacodyl  10 mg Oral Q0600   diltiazem  90 mg Oral Q8H   magnesium hydroxide  30 mL Oral BID   pantoprazole  40 mg Oral Daily   potassium chloride  40 mEq Oral Once   tamsulosin  0.4 mg Oral Daily   umeclidinium bromide  1 puff Inhalation QHS    Continuous Infusions:  ampicillin-sulbactam (UNASYN) IV 3 g (01/31/23 0357)    PRN Meds: acetaminophen, albuterol, alum & mag hydroxide-simeth, antiseptic oral rinse, bisacodyl, glycopyrrolate **OR** glycopyrrolate **OR** glycopyrrolate, haloperidol **OR** haloperidol **OR** haloperidol lactate, hydrALAZINE, LORazepam **OR** LORazepam **OR** LORazepam, morphine CONCENTRATE **OR** morphine injection, ondansetron (ZOFRAN) IV, polyethylene glycol,  polyvinyl alcohol  Physical Exam Vitals and nursing note reviewed.  Constitutional:      General: He is not in acute distress.    Appearance: He is ill-appearing.  Pulmonary:     Effort: No respiratory distress.  Skin:    General: Skin is warm and dry.  Neurological:     Motor: Weakness present.             Vital Signs: BP (!) 124/51 (BP Location: Left Arm)   Pulse 98   Temp 98.2 F (36.8 C) (Axillary)   Resp 17   Ht 5\' 1"  (1.549 m)  Wt 43.5 kg   SpO2 100%   BMI 18.12 kg/m  SpO2: SpO2: 100 % O2 Device: O2 Device: Nasal Cannula O2 Flow Rate: O2 Flow Rate (L/min): 8 L/min  Intake/output summary:  Intake/Output Summary (Last 24 hours) at 01/31/2023 0844 Last data filed at 01/31/2023 0430 Gross per 24 hour  Intake 100 ml  Output 2400 ml  Net -2300 ml   LBM: Last BM Date : 01/29/23 Baseline Weight: Weight: 43.5 kg Most recent weight: Weight: 43.5 kg       Palliative Assessment/Data: PPS 10%      Patient Active Problem List   Diagnosis Date Noted   Increased anion gap metabolic acidosis 01/14/2023   DNR (do not resuscitate) 01/14/2023   Pain due to onychomycosis of toenails of both feet 09/14/2022   Medication monitoring encounter 01/29/2022   Screening of cancer 01/29/2022   Cerebral embolism with cerebral infarction 12/22/2021   Pressure injury of skin 12/21/2021   Acute respiratory failure with hypoxia (HCC) 12/20/2021   Severe protein-energy malnutrition (HCC) 12/20/2021   Acidosis, metabolic 12/20/2021   AKI (acute kidney injury) (HCC) 09/27/2021   Right sided weakness 09/27/2021   Stage 3a chronic kidney disease (HCC) 08/14/2019   HCAP (healthcare-associated pneumonia) 08/12/2017   GERD (gastroesophageal reflux disease) 07/30/2015   Tobacco abuse 12/06/2014   COPD (chronic obstructive pulmonary disease) (HCC)    Hx of CABG Feb 2010 08/21/2013   Peripheral arterial disease (HCC) 08/21/2013   Carotid artery disease (HCC) 08/21/2013   Diabetes (HCC)  08/21/2013   Essential hypertension 08/21/2013   Hyperlipidemia 08/21/2013    Palliative Care Assessment & Plan   Patient Profile: 72 y.o. male  with past medical history of COPD, CAD s/p CABG, PAD s/p stent, HLD, and HTN who presented to the ED on 01/14/2023 with weakness, nausea, and vomiting.  Per H&P, patient was found at home with bedbugs and covered in fecal matter. MRCP on 5/23 showed choledocholelithiasis, however he was ultimately deemed too high risk for cholecystectomy.  On 6/1, there was concern for ischemic changes to his right toes and foot, he was evaluated by vascular surgery, and underwent right AKA on 6/5.  Assessment: Principal Problem:   Increased anion gap metabolic acidosis Active Problems:   Hx of CABG Feb 2010   Peripheral arterial disease (HCC)   Diabetes (HCC)   Essential hypertension   COPD (chronic obstructive pulmonary disease) (HCC)   Stage 3a chronic kidney disease (HCC)   Right sided weakness   DNR (do not resuscitate)   Terminal care  Recommendations/Plan: Continue full comfort measures Continue DNR/DNI as previously documented - durable DNR form completed and placed in shadow chart. Copy was made and will be scanned into Vynca/ACP tab Transfer to [facility] when bed available - TOC consult place; TOC already aware Continue current comfort focused medication regimen Oral medications discontinued - no longer tolerating POs Continue palliative wound care Continue antibiotics for now with goal for comfort/symptom management Discontinued orders that were not focused on comfort Unrestricted visitation orders were placed per current [place] [facility] visitation policy  PMT will continue to follow and support holistically   Goals of Care and Additional Recommendations: Limitations on Scope of Treatment: Full Comfort Care  Code Status:    Code Status Orders  (From admission, onward)           Start     Ordered   01/30/23  1617  Do not attempt resuscitation (DNR)  Continuous  Question Answer Comment  If patient has no pulse and is not breathing Do Not Attempt Resuscitation   If patient has a pulse and/or is breathing: Medical Treatment Goals COMFORT MEASURES: Keep clean/warm/dry, use medication by any route; positioning, wound care and other measures to relieve pain/suffering; use oxygen, suction/manual treatment of airway obstruction for comfort; do not transfer unless for comfort needs.   Consent: Discussion documented in EHR or advanced directives reviewed      01/30/23 1619           Code Status History     Date Active Date Inactive Code Status Order ID Comments User Context   01/14/2023 1724 01/30/2023 1619 DNR [id number]  Jonah Blue, MD ED   01/14/2023 1719 01/14/2023 1724 Full Code [id number]  Jonah Blue, MD ED   09/29/2022 1147 09/29/2022 2109 Full Code [id number]  Nada Libman, MD Inpatient   03/31/2022 1328 03/31/2022 2215 Full Code [id number]  Nada Libman, MD Inpatient   01/10/2022 1602 01/12/2022 1636 Full Code [id number]  Emeline General, MD ED   12/20/2021 1603 12/31/2021 1731 Full Code [id number]  Lynnell Catalan, MD ED   12/20/2021 1441 12/20/2021 1603 Full Code [id number]  Clydie Braun, MD ED   09/27/2021 1551 09/29/2021 2101 Full Code [id number]  Clydie Braun, MD ED   09/07/2017 1532 09/08/2017 0017 Full Code [id number]  Nada Libman, MD Inpatient   08/12/2017 2125 08/15/2017 1816 Full Code [id number]  Eduard Clos, MD ED   12/08/2016 1347 12/08/2016 2117 Full Code [id number]  Nada Libman, MD Inpatient   07/08/2015 0551 07/09/2015 2017 Full Code [id number]  Clydie Braun, MD ED   12/06/2014 0416 12/07/2014 1644 Full Code [id number]  Ron Parker, MD ED       Prognosis:  < 2 weeks  Discharge Planning: Hospice facility  Care plan was discussed with primary RN  Thank you for allowing the Palliative Medicine Team to assist in the care of this patient.   Haskel Khan, NP  Please contact Palliative Medicine Team phone at [phone] for questions and concerns.   *Portions of this note are a verbal dictation therefore any spelling and/or grammatical errors are due to the "Dragon [employer] One" system interpretation.

## 2023-01-31 NOTE — Progress Notes (Signed)
PROGRESS NOTE        PATIENT DETAILS Name: Reginald Ayers Age: 72 y.o. Sex: male Date of Birth: 05-20-1951 Admit Date: 01/14/2023 Admitting Physician Jonah Blue, MD ZOX:[id number], Selena Batten, NP  Brief Summary: Patient is a 72 y.o.  male COPD, CAD s/p CABG, PAD s/p stent, HLD, HTN-who presented with weakness.  Per H&P-patient was found at home with bedbugs, and covered in fecal matter.   Significant events: 5/23>> admit to [facility]-weakness/nausea/vomiting-found covered in fecal matter/bedbugs. 5/24>> MRCP with choledocholithiasis 5/28>> ERCP-sphincterotomy 5/29>> evaluation by cardiology-high risk for cholecystectomy 5/30>> General surgery signed off-no plans to pursue cholecystectomy. 6/01>> right foot toes-purple-right foot is cold.  Vascular surgery consulted.  Significant studies: 5/23>> MRI brain: No acute abnormality-chronically occluded left ICA 5/23>> CT abdomen/pelvis: Mild groundglass density in the right lower lobe, distended gallbladder  without calcified stone, dilated CBD measuring up to 19 mm with suspicion for intrahepatic dilatation. 5/24>> MRCP:Severe intrahepatic/extrahepatic biliary ductal dilatation-tiny gallstones/sludge within the cystic duct near the ampulla.  Significant microbiology data: None  Procedures: 5/28>> ERCP-sphincterotomy 01/27/2023.  Right AKA by vascular surgery  Consults: GI General surgery Cardiology Vascular surgery  Subjective:   Patient in bed appears to be in no discomfort, full comfort measures on morphine drip.   Objective: Vitals: Blood pressure (!) 124/51, pulse 98, temperature 98.2 F (36.8 C), temperature source Axillary, resp. rate 17, height 5\' 1"  (1.549 m), weight 43.5 kg, SpO2 100 %.   Exam:  Awake Alert, No new F.N deficits, Foley catheter in place, [place].AT,PERRAL Supple Neck, No JVD,   Symmetrical Chest wall movement, Good air movement bilaterally, coarse bilateral breath sounds RRR,No Gallops,  Rubs or new Murmurs,  +ve B.Sounds, Abd Soft, No tenderness,   Right AKA stump site stable   Assessment/Plan:  Despite appropriate treatment patient continued to decline with multiple chronic and acute comorbidities, finally went being nausea vomiting and aspiration pneumonia requiring BiPAP, transition to full comfort measures now.  Other medical issues addressed earlier this admission are below.  All non comfort medications have been discontinued now.     Weakness/nausea/vomiting - Dilated bile ducts secondary to choledocholithiasis -  Poor historian-unclear etiology-given MRCP findings-concerned that his clinical presentation on admission could have been due to a passed CBD stone/symptomatic choledocholithiasis. He is s/p ERCP on 5/28-not felt to be a good candidate for cholecystectomy given high risk profile. Nausea/vomiting has resolved-he is tolerating regular diet.  Anion gap metabolic acidosis  - Likely starvation ketoacidosis-due to poor intake/possible vomiting-in the setting of possible passed CBD stone. Improving with supportive care  PAD, Right foot ischemic foot - Has had numerous procedures in the past-follow with Dr. Myra Gianotti, Prior to this hospitalization he was on aspirin/Plavix/Eliquis - VVS following, continue IV heparin and Plavix, per vascular surgery this is likely due to his chronic PAD getting worse, seen by vascular surgery and underwent AKA on 01/27/2023, continue pain control, agreeable for transfusion if needed.  Constipation related nausea vomiting with aspiration pneumonia on 01/29/2023.  Placed on Unasyn, clear liquid diet, bowel regimen for constipation, oxygen, nebulizer treatment, chest PT with suctioning, discussed with RT and RN.  Clear liquid diet for now.  Speech to evaluate.  Continue to get worse requiring BiPAP, transitioned eventually to full comfort measures after discussions between palliative care, patient and patient's niece on 01/30/2023.  History of  CVA No obvious deficits on exam-MRI  brain negative Continue statin See below regarding antiplatelets.  PAF In RVR on 01/30/2023, Cardizem drip, oral Cardizem overlap, Eliquis  AKI Mild Continue to monitor closely.  Urinary retention.  Foley and Flomax started on 01/29/2023.   Hyponatremia SIADH.  Resolved after [place]  HTN BP better controlled with amlodipine/metoprolol.  CAD-s/p CABG No anginal symptoms See below regarding antiplatelets. Continue statin/beta-blocker/antiplatelets  Constipation.  Placed on bowel regimen.    Acute toxic metabolic encephalopathy Somewhat hard to arouse this morning-but much more easily aroused this afternoon.  ABG stable-nonfocal exam. Suspect this may be due to gabapentin-will discontinue and monitor closely.  COPD Stable Bronchodilators    Debility/deconditioning Failure to thrive Secondary to acute illness-frailty at baseline [facility] refuses SNF-wants to go home with home health.  Understands risk of rehospitalization/decompensation.  Claims lives with 2 roommates-and has 7 dogs that he misses.  DM-2 CBGs on the higher side-but due to lingering encephalopathy today-will not adjust insulin doses-as oral intake is poor Continue Semglee 14 units daily, 4 units of NovoLog with send SSI. Watch closely-he is very frail-and is not a candidate for aggressive glycemic control.    Recent Labs    01/30/23 2017 01/30/23 2045 01/31/23 0759  GLUCAP 41* 200* 105*    Code status:   Code Status: DNR   DVT Prophylaxis: IV heparin gtt   Family Communication: Niece-[name] [phone]-left VM on 5/25, 5/26,5/27   Disposition Plan:   The patient will require care spanning > 2 midnights and should be moved to inpatient because: Severity of illness.   Planned Discharge Destination:Home health   Diet: Diet Order             Diet regular Room service appropriate? Yes; Fluid consistency: Thin  Diet effective now                      Antimicrobial agents: Anti-infectives (From admission, onward)    Start     Dose/Rate Route Frequency Ordered Stop   01/29/23 1530  Ampicillin-Sulbactam (UNASYN) 3 g in sodium chloride 0.9 % 100 mL IVPB        3 g 200 mL/hr over 30 Minutes Intravenous Every 6 hours 01/29/23 1444     01/19/23 1130  ciprofloxacin (CIPRO) IVPB 400 mg        400 mg 200 mL/hr over 60 Minutes Intravenous  Once 01/19/23 1037 01/19/23 1230   01/14/23 1745  cefTRIAXone (ROCEPHIN) 1 g in sodium chloride 0.9 % 100 mL IVPB        1 g 200 mL/hr over 30 Minutes Intravenous  Once 01/14/23 1741 01/14/23 1922   01/14/23 1745  azithromycin (ZITHROMAX) 500 mg in sodium chloride 0.9 % 250 mL IVPB        500 mg 250 mL/hr over 60 Minutes Intravenous  Once 01/14/23 1741 01/14/23 2315        MEDICATIONS: Scheduled Meds:  umeclidinium bromide  1 puff Inhalation QHS   Continuous Infusions:  ampicillin-sulbactam (UNASYN) IV 3 g (01/31/23 0357)   PRN Meds:.acetaminophen, albuterol, alum & mag hydroxide-simeth, antiseptic oral rinse, bisacodyl, glycopyrrolate **OR** glycopyrrolate **OR** glycopyrrolate, haloperidol **OR** haloperidol **OR** haloperidol lactate, LORazepam **OR** LORazepam **OR** LORazepam, morphine CONCENTRATE **OR** morphine injection, ondansetron (ZOFRAN) IV, polyethylene glycol, polyvinyl alcohol   I have personally reviewed following labs and imaging studies  LABORATORY DATA:  Recent Labs  Lab 01/26/23 0233 01/27/23 0643 01/28/23 0725 01/29/23 0624 01/30/23 0243  WBC 11.7* 12.3* 14.6* 18.8* 20.8*  HGB 8.7* 8.2* 8.7* 8.8* 7.4*  HCT 27.4* 26.2* 27.9* 28.1* 23.8*  PLT 352 343 375 410* 365  MCV 87.8 91.0 93.3 92.1 91.2  MCH 27.9 28.5 29.1 28.9 28.4  MCHC 31.8 31.3 31.2 31.3 31.1  RDW 14.2 14.3 14.3 14.3 14.2  LYMPHSABS  --   --  0.3* 0.5* 0.2*  MONOABS  --   --  1.0 1.2* 0.9  EOSABS  --   --  0.0 0.0 0.0  BASOSABS  --   --  0.0 0.0 0.0    Recent Labs  Lab 01/25/23 0600  01/26/23 0233 01/27/23 0643 01/28/23 0725 01/29/23 0624 01/30/23 0243  NA 127* 128* 132* 133* 130* 135  K 4.8 4.9 4.1 4.0 3.7 3.9  CL 94* 92* 99 99 96* 100  CO2 24 26 22 23  21* 24  ANIONGAP 9 10 11 11 13 11   GLUCOSE 181* 145* 125* 209* 250* 271*  BUN 22 28* 19 17 26* 27*  CREATININE 1.19 1.26* 1.07 1.12 1.16 1.37*  CRP 3.6* 5.2* 8.6*  --   --  17.9*  PROCALCITON 0.16 0.41 0.18 0.14  --  7.15  BNP 329.0* 223.8* 314.1* 1,121.9* 414.3* 902.9*  MG 1.6* 1.8 1.8 2.0 1.7 1.9  CALCIUM 9.2 9.2 8.7* 8.9 9.0 9.0    Lab Results  Component Value Date   CHOL 127 01/15/2023   HDL 28 (L) 01/15/2023   LDLCALC 63 01/15/2023   TRIG 179 (H) 01/15/2023   CHOLHDL 4.5 01/15/2023      Recent Labs  Lab 01/25/23 0600 01/26/23 0233 01/27/23 0643 01/28/23 0725 01/29/23 0624 01/30/23 0243  CRP 3.6* 5.2* 8.6*  --   --  17.9*  PROCALCITON 0.16 0.41 0.18 0.14  --  7.15  BNP 329.0* 223.8* 314.1* 1,121.9* 414.3* 902.9*  MG 1.6* 1.8 1.8 2.0 1.7 1.9  CALCIUM 9.2 9.2 8.7* 8.9 9.0 9.0   MICROBIOLOGY: No results found for this or any previous visit (from the past 240 hour(s)).  RADIOLOGY STUDIES/RESULTS: DG Abd Portable 1V  Result Date: 01/30/2023 CLINICAL DATA:  Nausea, weakness EXAM: PORTABLE ABDOMEN - 1 VIEW COMPARISON:  01/29/2023 abdominal radiograph FINDINGS: Intact lower sternotomy wires. Large colonic stool volume, unchanged. A few mildly dilated small bowel loops, unchanged. No evidence of pneumatosis or pneumoperitoneum. Moderate lumbar spondylosis. IMPRESSION: Stable mildly dilated small bowel loops and large colonic stool volume, favoring ileus/constipation. Electronically Signed   By: Delbert Phenix M.D.   On: 01/30/2023 08:09   DG Chest Port 1 View  Result Date: 01/30/2023 CLINICAL DATA:  Dyspnea EXAM: PORTABLE CHEST 1 VIEW COMPARISON:  Chest radiograph from one day prior. FINDINGS: Intact sternotomy wires. CABG clips overlie the left mediastinum. Stable cardiomediastinal silhouette with  normal heart size. No pneumothorax. Trace right pleural effusion is stable. Patchy left lung base consolidation with probable trace left pleural effusion, worsened. No pulmonary edema. IMPRESSION: 1. Patchy left lung base consolidation with probable trace left pleural effusion, worsened. Findings suspicious for pneumonia as seen on CT abdomen from 1 day prior. 2. Stable trace right pleural effusion. Electronically Signed   By: Delbert Phenix M.D.   On: 01/30/2023 08:07   CT ABDOMEN PELVIS WO CONTRAST  Result Date: 01/29/2023 CLINICAL DATA:  Bowel obstruction suspected. EXAM: CT ABDOMEN AND PELVIS WITHOUT CONTRAST TECHNIQUE: Multidetector CT imaging of the abdomen and pelvis was performed following the standard protocol without IV contrast. RADIATION DOSE REDUCTION: This exam was performed according to the departmental dose-optimization program which includes automated exposure control, adjustment of the mA and/or kV according  to patient size and/or use of iterative reconstruction technique. COMPARISON:  01/14/2023 FINDINGS: Lower chest: Interval development of dense left lower lobe collapse/consolidation with air bronchograms. Progression of airspace disease in the right lower lobe evident with some confluent/consolidative airspace disease on the current exam. Hepatobiliary: No suspicious focal abnormality in the liver on this study without intravenous contrast. There is no evidence for gallstones, gallbladder wall thickening, or pericholecystic fluid. Common bile duct measures up to 13 mm diameter, stable to minimally decreased in the interval. Pancreas: No evidence for main duct dilatation. Spleen: No splenomegaly. No focal mass lesion. Adrenals/Urinary Tract: No adrenal nodule or mass. Probable punctate nonobstructing stone lower pole right kidney. Left kidney unremarkable. No evidence for hydroureter. Urinary bladder decompressed by Foley catheter. Stomach/Bowel: Stomach is markedly distended with gas and  fluid. There is diffuse gaseous distention of small bowel measuring up to 3 cm diameter. Some small bowel loops in the pelvis appear decompressed. Large stool volume noted in the colon with diffuse gaseous distension of colon. Gas and stool is seen in the left colon down to the level of the rectum. Vascular/Lymphatic: There is advanced atherosclerotic calcification of the abdominal aorta without aneurysm. There is no gastrohepatic or hepatoduodenal ligament lymphadenopathy. No retroperitoneal or mesenteric lymphadenopathy. No pelvic sidewall lymphadenopathy. Reproductive: The prostate gland and seminal vesicles are unremarkable. Other: No intraperitoneal free fluid. Musculoskeletal: Diffuse body wall edema evident. No worrisome lytic or sclerotic osseous abnormality. IMPRESSION: 1. Interval development of dense left lower lobe collapse/consolidation with air bronchograms. Progression of airspace disease in the right lower lobe evident with some confluent/consolidative airspace disease on the current exam. Imaging features compatible with multifocal pneumonia. 2. Marked gaseous distension of the stomach with diffuse gaseous distension of small bowel and colon. Some small bowel loops in the pelvis appear decompressed. Given the relatively large volume of stool in the colon down to the level of the rectum, ileus is favored as etiology for the appearance on today's study. Distal small bowel obstruction cannot be definitively excluded. 3. Diffuse body wall edema. 4.  Aortic Atherosclerosis (ICD10-I70.0). Electronically Signed   By: Kennith Center M.D.   On: 01/29/2023 17:26   DG Abd Portable 1V  Result Date: 01/29/2023 CLINICAL DATA:  Nausea and vomiting EXAM: PORTABLE ABDOMEN - 1 VIEW COMPARISON:  01/14/2023 FINDINGS: Prominent stool throughout the colon favors constipation. No definite dilated small bowel. Atherosclerosis is present, including aortoiliac atherosclerotic disease. Left common iliac artery stent. Mild  to moderate degenerative hip arthropathy bilaterally. IMPRESSION: 1. Prominent stool throughout the colon favors constipation. 2. Aortoiliac atherosclerosis. 3. Mild to moderate degenerative hip arthropathy bilaterally. Electronically Signed   By: Gaylyn Rong M.D.   On: 01/29/2023 15:01   DG Chest Port 1 View  Result Date: 01/29/2023 CLINICAL DATA:  Shortness of breath. EXAM: PORTABLE CHEST 1 VIEW COMPARISON:  01/14/2023 FINDINGS: The lungs are clear without focal pneumonia, edema, pneumothorax or pleural effusion. Interstitial markings are diffusely coarsened with chronic features. Chronic atelectasis or scarring noted at the bases. The cardiopericardial silhouette is within normal limits for size. Bones are diffusely demineralized. Telemetry leads overlie the chest. Gaseous distention of stomach and bowel noted in the visualized upper abdomen. IMPRESSION: Chronic interstitial coarsening without acute cardiopulmonary findings. Gaseous distention of stomach and bowel within the visualized upper abdomen. Electronically Signed   By: Kennith Center M.D.   On: 01/29/2023 14:19     LOS: 16 days   Signature  -    Susa Raring M.D on 01/31/2023 at  10:14 AM   -  To page go to [email or web address]

## 2023-02-01 LAB — GLUCOSE, CAPILLARY: Glucose-Capillary: 266 mg/dL — ABNORMAL HIGH (ref 70–99)

## 2023-02-01 NOTE — Progress Notes (Signed)
Hospice charge nurse called when [facility] came to pick up patient.

## 2023-02-01 NOTE — Discharge Summary (Signed)
Reginald Ayers:[id number] DOB: March 01, 1951 DOA: 01/14/2023  PCP: Loura Back, NP  Admit date: 01/14/2023  Discharge date: 02/01/2023  Admitted From: Home    Disposition:  Res.Hospice   Recommendations for Outpatient Follow-up:   Follow up with PCP in 1-2 weeks  PCP Please obtain BMP/CBC, 2 view CXR in 1week,  (see Discharge instructions)   PCP Please follow up on the following pending results:    Disposition.  Residential hospice Condition.  Guarded CODE STATUS.  DNR Activity.  With assistance as tolerated, full fall precautions. Diet.  Soft with feeding assistance and aspiration precautions. Goal of care.  Comfort.   Diet Order             Diet regular Room service appropriate? Yes; Fluid consistency: Thin  Diet effective now                    Chief Complaint  Patient presents with   Weakness   Altered Mental Status     Brief history of present illness from the day of admission and additional interim summary     72 y.o.  male COPD, CAD s/p CABG, PAD s/p stent, HLD, HTN-who presented with weakness.  Per H&P-patient was found at home with bedbugs, and covered in fecal matter.    Significant events: 5/23>> admit to [facility]-weakness/nausea/vomiting-found covered in fecal matter/bedbugs. 5/24>> MRCP with choledocholithiasis 5/28>> ERCP-sphincterotomy 5/29>> evaluation by cardiology-high risk for cholecystectomy 5/30>> General surgery signed off-no plans to pursue cholecystectomy. 6/01>> right foot toes-purple-right foot is cold.  Vascular surgery consulted.   Significant studies: 5/23>> MRI brain: No acute abnormality-chronically occluded left ICA 5/23>> CT abdomen/pelvis: Mild groundglass density in the right lower lobe, distended gallbladder  without calcified stone, dilated CBD measuring up to  19 mm with suspicion for intrahepatic dilatation. 5/24>> MRCP:Severe intrahepatic/extrahepatic biliary ductal dilatation-tiny gallstones/sludge within the cystic duct near the ampulla.   Significant microbiology data: None   Procedures: 5/28>> ERCP-sphincterotomy 01/27/2023.  Right AKA by vascular surgery   Consults: GI General surgery Cardiology Vascular surgery Pall. care                                                                  Hospital Course      Despite appropriate treatment patient continued to decline with multiple chronic and acute comorbidities, finally went being nausea vomiting and aspiration pneumonia requiring BiPAP, transition to full comfort measures now. Other medical issues addressed earlier this admission are below. All non comfort medications have been discontinued now.   Other Med.issues addressed before moving to comfort measures are below - now Comf Meds only.    PAD, Right foot ischemic foot - Has had numerous procedures in the past-follow with Dr. Myra Gianotti, Prior to this hospitalization he was on aspirin/Plavix/Eliquis - [facility] following,  continue IV heparin and Plavix, per vascular surgery this is likely due to his chronic PAD getting worse, seen by vascular surgery and underwent AKA on 01/27/2023, Now comfort meds only.   Constipation related nausea vomiting with aspiration pneumonia on 01/29/2023.  Placed on Unasyn, clear liquid diet, bowel regimen for constipation, oxygen, nebulizer treatment, chest PT with suctioning, discussed with RT and RN.  Clear liquid diet for now.  Speech to evaluate.  Continue to get worse requiring BiPAP, transitioned eventually to full comfort measures after discussions between palliative care, patient and patient's niece on 01/30/2023.   Weakness/nausea/vomiting - Dilated bile ducts secondary to choledocholithiasis -  Poor historian-unclear etiology-given MRCP findings-concerned that his clinical presentation on admission could have  been due to a passed CBD stone/symptomatic choledocholithiasis. He is s/p ERCP on 5/28-not felt to be a good candidate for cholecystectomy given high risk profile.     Anion gap metabolic acidosis  - Likely starvation ketoacidosis-due to poor intake/possible vomiting-in the setting of possible passed CBD stone. Improving with supportive care  History of CVA No obvious deficits on exam-MRI brain negative Now comfort meds only   PAF Now comfort meds only   AKI Mild     Urinary retention.  Foley continue for comfort.     Hyponatremia SIADH.  Resolved after Samsca   HTN Now comfort meds only   CAD-s/p CABG No anginal symptoms See below regarding antiplatelets. Now comfort meds only   Constipation.  Placed on bowel regimen.     Acute toxic metabolic encephalopathy Had improved   COPD Stable Bronchodilators    Discharge diagnosis     Principal Problem:   Increased anion gap metabolic acidosis Active Problems:   Hx of CABG Feb 2010   Peripheral arterial disease (HCC)   Diabetes (HCC)   Essential hypertension   COPD (chronic obstructive pulmonary disease) (HCC)   Stage 3a chronic kidney disease (HCC)   Right sided weakness   DNR (do not resuscitate)    Discharge instructions    Discharge Instructions     Discharge instructions   Complete by: As directed    Disposition.  Residential hospice Condition.  Guarded CODE STATUS.  DNR Activity.  With assistance as tolerated, full fall precautions. Diet.  Soft with feeding assistance and aspiration precautions. Goal of care.  Comfort.   Discharge wound care:   Complete by: As directed    Keep R AKA stump site clean and dry       Discharge Medications   Allergies as of 02/01/2023       Reactions   Actos [pioglitazone] Shortness Of Breath   Leg swelling    Lipitor [atorvastatin] Other (See Comments)   Leg pain    Zestril [lisinopril] Cough        Medication List     STOP taking these medications     apixaban 2.5 MG Tabs tablet Commonly known as: Eliquis   Aspirin Low Dose 81 MG tablet Generic drug: aspirin EC   clopidogrel 75 MG tablet Commonly known as: Plavix   metFORMIN 500 MG tablet Commonly known as: GLUCOPHAGE   metoprolol succinate 25 MG 24 hr tablet Commonly known as: TOPROL-XL   Pentips 32G X 4 MM Misc Generic drug: Insulin Pen Needle   potassium chloride 10 MEQ tablet Commonly known as: KLOR-CON   rosuvastatin 20 MG tablet Commonly known as: CRESTOR   traMADol 50 MG tablet Commonly known as: ULTRAM       TAKE these medications  Accu-Chek Guide w/Device Kit Used to test blood sugars 4 times daily   acetaminophen 650 MG CR tablet Commonly known as: TYLENOL Take 1,300 mg by mouth every 8 (eight) hours as needed for pain.   albuterol 108 (90 Base) MCG/ACT inhaler Commonly known as: VENTOLIN HFA Inhale 2 puffs into the lungs every 4 (four) hours as needed for shortness of breath or wheezing.   Centrum Silver 50+Men Tabs Take 1 tablet by mouth daily.   ferrous sulfate 325 (65 FE) MG tablet Take 325 mg by mouth daily with breakfast.   Nitrostat 0.4 MG SL tablet Generic drug: nitroGLYCERIN Take 0.4 mg by mouth every 5 (five) minutes as needed for chest pain.   omeprazole 20 MG capsule Commonly known as: PRILOSEC Take 20 mg by mouth daily.   oxymetazoline 0.05 % nasal spray Commonly known as: AFRIN Place 1 spray into both nostrils 2 (two) times daily as needed for congestion. Mucinex brand   Spiriva HandiHaler 18 MCG inhalation capsule Generic drug: tiotropium Place 18 mcg into inhaler and inhale at bedtime.   VITAMIN B-12 PO Take 1 tablet by mouth daily.               Discharge Care Instructions  (From admission, onward)           Start     Ordered   02/01/23 0000  Discharge wound care:       Comments: Keep R AKA stump site clean and dry   02/01/23 1605             Contact information for after-discharge care      Destination     [facility] Preferred SNF .   Service: Skilled Nursing Contact information: [address] [place] [address] [phone]                     Major procedures and Radiology Reports - PLEASE review detailed and final reports thoroughly  -       DG Abd Portable 1V  Result Date: 01/30/2023 CLINICAL DATA:  Nausea, weakness EXAM: PORTABLE ABDOMEN - 1 VIEW COMPARISON:  01/29/2023 abdominal radiograph FINDINGS: Intact lower sternotomy wires. Large colonic stool volume, unchanged. A few mildly dilated small bowel loops, unchanged. No evidence of pneumatosis or pneumoperitoneum. Moderate lumbar spondylosis. IMPRESSION: Stable mildly dilated small bowel loops and large colonic stool volume, favoring ileus/constipation. Electronically Signed   By: Delbert Phenix M.D.   On: 01/30/2023 08:09   DG Chest Port 1 View  Result Date: 01/30/2023 CLINICAL DATA:  Dyspnea EXAM: PORTABLE CHEST 1 VIEW COMPARISON:  Chest radiograph from one day prior. FINDINGS: Intact sternotomy wires. CABG clips overlie the left mediastinum. Stable cardiomediastinal silhouette with normal heart size. No pneumothorax. Trace right pleural effusion is stable. Patchy left lung base consolidation with probable trace left pleural effusion, worsened. No pulmonary edema. IMPRESSION: 1. Patchy left lung base consolidation with probable trace left pleural effusion, worsened. Findings suspicious for pneumonia as seen on CT abdomen from 1 day prior. 2. Stable trace right pleural effusion. Electronically Signed   By: Delbert Phenix M.D.   On: 01/30/2023 08:07   CT ABDOMEN PELVIS WO CONTRAST  Result Date: 01/29/2023 CLINICAL DATA:  Bowel obstruction suspected. EXAM: CT ABDOMEN AND PELVIS WITHOUT CONTRAST TECHNIQUE: Multidetector CT imaging of the abdomen and pelvis was performed following the standard protocol without IV contrast. RADIATION DOSE REDUCTION: This exam was performed according to the  departmental dose-optimization program which includes automated exposure  control, adjustment of the mA and/or kV according to patient size and/or use of iterative reconstruction technique. COMPARISON:  01/14/2023 FINDINGS: Lower chest: Interval development of dense left lower lobe collapse/consolidation with air bronchograms. Progression of airspace disease in the right lower lobe evident with some confluent/consolidative airspace disease on the current exam. Hepatobiliary: No suspicious focal abnormality in the liver on this study without intravenous contrast. There is no evidence for gallstones, gallbladder wall thickening, or pericholecystic fluid. Common bile duct measures up to 13 mm diameter, stable to minimally decreased in the interval. Pancreas: No evidence for main duct dilatation. Spleen: No splenomegaly. No focal mass lesion. Adrenals/Urinary Tract: No adrenal nodule or mass. Probable punctate nonobstructing stone lower pole right kidney. Left kidney unremarkable. No evidence for hydroureter. Urinary bladder decompressed by Foley catheter. Stomach/Bowel: Stomach is markedly distended with gas and fluid. There is diffuse gaseous distention of small bowel measuring up to 3 cm diameter. Some small bowel loops in the pelvis appear decompressed. Large stool volume noted in the colon with diffuse gaseous distension of colon. Gas and stool is seen in the left colon down to the level of the rectum. Vascular/Lymphatic: There is advanced atherosclerotic calcification of the abdominal aorta without aneurysm. There is no gastrohepatic or hepatoduodenal ligament lymphadenopathy. No retroperitoneal or mesenteric lymphadenopathy. No pelvic sidewall lymphadenopathy. Reproductive: The prostate gland and seminal vesicles are unremarkable. Other: No intraperitoneal free fluid. Musculoskeletal: Diffuse body wall edema evident. No worrisome lytic or sclerotic osseous abnormality. IMPRESSION: 1. Interval development of dense  left lower lobe collapse/consolidation with air bronchograms. Progression of airspace disease in the right lower lobe evident with some confluent/consolidative airspace disease on the current exam. Imaging features compatible with multifocal pneumonia. 2. Marked gaseous distension of the stomach with diffuse gaseous distension of small bowel and colon. Some small bowel loops in the pelvis appear decompressed. Given the relatively large volume of stool in the colon down to the level of the rectum, ileus is favored as etiology for the appearance on today's study. Distal small bowel obstruction cannot be definitively excluded. 3. Diffuse body wall edema. 4.  Aortic Atherosclerosis (ICD10-I70.0). Electronically Signed   By: Kennith Center M.D.   On: 01/29/2023 17:26   DG Abd Portable 1V  Result Date: 01/29/2023 CLINICAL DATA:  Nausea and vomiting EXAM: PORTABLE ABDOMEN - 1 VIEW COMPARISON:  01/14/2023 FINDINGS: Prominent stool throughout the colon favors constipation. No definite dilated small bowel. Atherosclerosis is present, including aortoiliac atherosclerotic disease. Left common iliac artery stent. Mild to moderate degenerative hip arthropathy bilaterally. IMPRESSION: 1. Prominent stool throughout the colon favors constipation. 2. Aortoiliac atherosclerosis. 3. Mild to moderate degenerative hip arthropathy bilaterally. Electronically Signed   By: Gaylyn Rong M.D.   On: 01/29/2023 15:01   DG Chest Port 1 View  Result Date: 01/29/2023 CLINICAL DATA:  Shortness of breath. EXAM: PORTABLE CHEST 1 VIEW COMPARISON:  01/14/2023 FINDINGS: The lungs are clear without focal pneumonia, edema, pneumothorax or pleural effusion. Interstitial markings are diffusely coarsened with chronic features. Chronic atelectasis or scarring noted at the bases. The cardiopericardial silhouette is within normal limits for size. Bones are diffusely demineralized. Telemetry leads overlie the chest. Gaseous distention of stomach and  bowel noted in the visualized upper abdomen. IMPRESSION: Chronic interstitial coarsening without acute cardiopulmonary findings. Gaseous distention of stomach and bowel within the visualized upper abdomen. Electronically Signed   By: Kennith Center M.D.   On: 01/29/2023 14:19   ABORTED INVASIVE LAB PROCEDURE  Result Date: 01/27/2023 This case was  aborted.  DG ERCP  Result Date: 01/19/2023 CLINICAL DATA:  Elective surgery Cholelithiasis EXAM: ERCP TECHNIQUE: Multiple spot images obtained with the fluoroscopic device and submitted for interpretation post-procedure. FLUOROSCOPY: Radiation Exposure Index (as provided by the fluoroscopic device): 8.8 mGy Kerma COMPARISON:  CT abdomen pelvis 01/14/2023 FINDINGS: Seven intraoperative fluoroscopic images were submitted for interpretation. The submitted images demonstrate moderately dilated intra and extrahepatic bile ducts. Three small filling defects seen in the distal common bile duct on the final image may be artifact, however small choledocholiths are difficult to exclude. IMPRESSION: Intraoperative fluoroscopic images of ERCP demonstrate moderately dilated intra and extrahepatic bile ducts. These images were submitted for radiologic interpretation only. Please see the procedural report for the amount of contrast and the fluoroscopy time utilized. Electronically Signed   By: Acquanetta Belling M.D.   On: 01/19/2023 14:07   MR ABDOMEN MRCP W WO CONTAST  Result Date: 01/15/2023 CLINICAL DATA:  Cholelithiasis EXAM: MRI ABDOMEN WITHOUT AND WITH CONTRAST (INCLUDING MRCP) TECHNIQUE: Multiplanar multisequence MR imaging of the abdomen was performed both before and after the administration of intravenous contrast. Heavily T2-weighted images of the biliary and pancreatic ducts were obtained, and three-dimensional MRCP images were rendered by post processing. CONTRAST:  4mL GADAVIST GADOBUTROL 1 MMOL/ML IV SOLN COMPARISON:  CT abdomen pelvis, 01/14/2023 FINDINGS: Examination  is generally limited by breath motion artifact. Lower chest: Heterogeneous airspace opacity of the bilateral lung bases. Hepatobiliary: No solid liver abnormality is seen. Gallstones and sludge in the dependent gallbladder. No gallbladder wall thickening. Severe intra and extrahepatic biliary ductal dilatation, common bile duct measuring up to 1.8 cm. Tiny gallstones and or sludge within the cystic duct (series 4, image 17) and likely near the ampulla (series 2, image 15, series 3, image 23). Pancreas: Unremarkable. No pancreatic ductal dilatation or surrounding inflammatory changes. Spleen: Normal in size without significant abnormality. Adrenals/Urinary Tract: Adrenal glands are unremarkable. Kidneys are normal, without renal calculi, solid lesion, or hydronephrosis. Stomach/Bowel: Stomach is within normal limits. No evidence of bowel wall thickening, distention, or inflammatory changes. Vascular/Lymphatic: Aortic atherosclerosis. No enlarged abdominal lymph nodes. Other: No abdominal wall hernia or abnormality. No ascites. Musculoskeletal: No acute or significant osseous findings. IMPRESSION: 1. Severe intra and extrahepatic biliary ductal dilatation, common bile duct measuring up to 1.8 cm. Tiny gallstones and or sludge within the cystic duct and likely near the ampulla. 2. Gallstones and sludge in the dependent gallbladder. No gallbladder wall thickening. 3. Heterogeneous airspace opacity of the bilateral lung bases. Electronically Signed   By: Jearld Lesch M.D.   On: 01/15/2023 23:06   MR LUMBAR SPINE WO CONTRAST  Result Date: 01/15/2023 CLINICAL DATA:  Low back pain EXAM: MRI LUMBAR SPINE WITHOUT CONTRAST TECHNIQUE: Multiplanar, multisequence MR imaging of the lumbar spine was performed. No intravenous contrast was administered. COMPARISON:  None Available. FINDINGS: Segmentation:  Standard. Alignment:  Physiologic. Vertebrae:  No fracture, evidence of discitis, or bone lesion. Conus medullaris and cauda  equina: Conus extends to the L1 level. Conus and cauda equina appear normal. Paraspinal and other soft tissues: Negative. Disc levels: L1-L2: Normal disc space and facet joints. No spinal canal stenosis. No neural foraminal stenosis. L2-L3: Small disc bulge. No spinal canal stenosis. No neural foraminal stenosis. L3-L4: Intermediate sized right subarticular disc osteophyte complex narrowing the right lateral recess and displacing the right L4 nerve root. No central spinal canal stenosis. No neural foraminal stenosis. L4-L5: Small left subarticular disc extrusion with inferior migration narrowing the left lateral recess. No spinal  canal stenosis. No neural foraminal stenosis. L5-S1: Small disc bulge with mild facet hypertrophy. No spinal canal stenosis. No neural foraminal stenosis. Visualized sacrum: Normal. IMPRESSION: 1. Intermediate sized right subarticular disc osteophyte complex at L3-L4 narrowing the right lateral recess and displacing the right L4 nerve root. Correlate for right L4 radiculopathy. 2. Small left subarticular disc extrusion with inferior migration at L4-L5 narrowing the left lateral recess. Correlate for left L5 radiculopathy. 3. No spinal canal or neural foraminal stenosis. Electronically Signed   By: Deatra Robinson M.D.   On: 01/15/2023 22:54   ECHOCARDIOGRAM COMPLETE  Result Date: 01/15/2023    ECHOCARDIOGRAM REPORT   Patient Name:   ARIAS UPLINGER Date of Exam: 01/15/2023 Medical Rec #:  [id number]      Height:       61.0 in Accession #:    [id number]     Weight:       95.9 lb Date of Birth:  [birth year]      BSA:          1.382 m Patient Age:    72 years       BP:           178/70 mmHg Patient Gender: M              HR:           92 bpm. Exam Location:  Inpatient Procedure: 2D Echo, Color Doppler, Cardiac Doppler and Intracardiac            Opacification Agent Indications:    TIA  History:        Patient has prior history of Echocardiogram examinations. CAD,                 Prior CABG,  COPD; Risk Factors:Hypertension, Diabetes and                 Dyslipidemia.  Sonographer:    Mike Gip Referring Phys: [id number] JENNIFER YATES IMPRESSIONS  1. Technically difficult study with limited visualizaiton of cardiac structures.  2. Left ventricular ejection fraction, by estimation, is 50 to 55%. The left ventricle has low normal function. The left ventricle demonstrates regional wall motion abnormalities (see scoring diagram/findings for description). The LV apex is dyskinetic and aneurysmal.  3. Right ventricular systolic function is normal. The right ventricular size is normal.  4. The mitral valve is normal in structure. No evidence of mitral valve regurgitation. No evidence of mitral stenosis.  5. The aortic valve is normal in structure. There is mild calcification of the aortic valve. Aortic valve regurgitation is not visualized. No aortic stenosis is present.  6. The inferior vena cava is normal in size with greater than 50% respiratory variability, suggesting right atrial pressure of 3 mmHg. FINDINGS  Left Ventricle: Left ventricular ejection fraction, by estimation, is 50 to 55%. The left ventricle has low normal function. The left ventricle demonstrates regional wall motion abnormalities. Definity contrast agent was given IV to delineate the left ventricular endocardial borders. The left ventricular internal cavity size was normal in size. There is no left ventricular hypertrophy. Left ventricular diastolic parameters are consistent with Grade I diastolic dysfunction (impaired relaxation).  LV Wall Scoring: The apex is dyskinetic. Right Ventricle: The right ventricular size is normal. No increase in right ventricular wall thickness. Right ventricular systolic function is normal. Left Atrium: Left atrial size was normal in size. Right Atrium: Right atrial size was normal in size. Pericardium: There is no evidence of pericardial effusion.  Mitral Valve: The mitral valve is normal in structure. No  evidence of mitral valve regurgitation. No evidence of mitral valve stenosis. Tricuspid Valve: The tricuspid valve is normal in structure. Tricuspid valve regurgitation is not demonstrated. No evidence of tricuspid stenosis. Aortic Valve: The aortic valve is normal in structure. There is mild calcification of the aortic valve. Aortic valve regurgitation is not visualized. No aortic stenosis is present. Pulmonic Valve: The pulmonic valve was normal in structure. Pulmonic valve regurgitation is not visualized. No evidence of pulmonic stenosis. Aorta: The aortic root is normal in size and structure. Venous: The inferior vena cava is normal in size with greater than 50% respiratory variability, suggesting right atrial pressure of 3 mmHg. IAS/Shunts: No atrial level shunt detected by color flow Doppler.  LEFT VENTRICLE PLAX 2D LVIDd:         3.60 cm     Diastology LVIDs:         3.00 cm     LV e' medial:    5.66 cm/s LV PW:         0.70 cm     LV E/e' medial:  10.1 LV IVS:        0.70 cm     LV e' lateral:   9.03 cm/s LVOT diam:     1.80 cm     LV E/e' lateral: 6.3 LV SV:         36 LV SV Index:   26 LVOT Area:     2.54 cm  LV Volumes (MOD) LV vol d, MOD A2C: 55.0 ml LV vol d, MOD A4C: 64.9 ml LV vol s, MOD A2C: 22.2 ml LV vol s, MOD A4C: 27.6 ml LV SV MOD A2C:     32.8 ml LV SV MOD A4C:     64.9 ml LV SV MOD BP:      36.9 ml RIGHT VENTRICLE            IVC RV Basal diam:  2.50 cm    IVC diam: 1.10 cm RV S prime:     8.92 cm/s TAPSE (M-mode): 1.3 cm LEFT ATRIUM           Index        RIGHT ATRIUM          Index LA diam:      2.90 cm 2.10 cm/m   RA Area:     7.08 cm LA Vol (A2C): 26.2 ml 18.96 ml/m  RA Volume:   9.15 ml  6.62 ml/m LA Vol (A4C): 22.9 ml 16.57 ml/m  AORTIC VALVE LVOT Vmax:   69.20 cm/s LVOT Vmean:  44.900 cm/s LVOT VTI:    0.143 m  AORTA Ao Root diam: 3.10 cm Ao Asc diam:  2.30 cm MITRAL VALVE MV Area (PHT): 4.26 cm    SHUNTS MV Decel Time: 178 msec    Systemic VTI:  0.14 m MV E velocity: 57.00 cm/s   Systemic Diam: 1.80 cm MV A velocity: 90.40 cm/s MV E/A ratio:  0.63 Aditya Sabharwal Electronically signed by Dorthula Nettles Signature Date/Time: 01/15/2023/2:06:35 PM    Final    MR BRAIN WO CONTRAST  Result Date: 01/14/2023 CLINICAL DATA:  Neuro deficit, acute, stroke suspected EXAM: MRI HEAD WITHOUT CONTRAST TECHNIQUE: Multiplanar, multiecho pulse sequences of the brain and surrounding structures were obtained without intravenous contrast. COMPARISON:  Same day CT head.  MRI head Dec 26, 2021. FINDINGS: Brain: No acute infarction, hemorrhage, hydrocephalus, extra-axial collection or mass lesion. Mild for  age scattered T2/FLAIR hyperintensities the white matter, nonspecific but compatible with chronic microvascular ischemic disease. Vascular: Abnormal left ICA flow void, compatible with known occlusion. Skull and upper cervical spine: Normal marrow signal. Sinuses/Orbits: Mild sinus mucosal thickening. No acute orbital findings. IMPRESSION: 1. No acute abnormality. 2. Chronically occluded left ICA. Electronically Signed   By: Feliberto Harts M.D.   On: 01/14/2023 18:06   CT ABDOMEN PELVIS WO CONTRAST  Result Date: 01/14/2023 CLINICAL DATA:  Lactic acidosis, back pain EXAM: CT ABDOMEN AND PELVIS WITHOUT CONTRAST TECHNIQUE: Multidetector CT imaging of the abdomen and pelvis was performed following the standard protocol without IV contrast. RADIATION DOSE REDUCTION: This exam was performed according to the departmental dose-optimization program which includes automated exposure control, adjustment of the mA and/or kV according to patient size and/or use of iterative reconstruction technique. COMPARISON:  Abdomen radiograph 01/14/2023, CT 12/20/2021 FINDINGS: Lower chest: Lung bases demonstrate small right-sided pleural effusion. Mild ground-glass density in the right lower lobe. Emphysema. Coronary vascular calcification Hepatobiliary: Distended gallbladder without calcified stone. No focal hepatic  abnormality. Common bile duct is dilated, measuring up to 19 mm. No calcified stones are visualized. Suspicion of mild intra hepatic biliary dilatation. Pancreas: No inflammation.  Atrophic. Spleen: Normal in size without focal abnormality. Adrenals/Urinary Tract: Adrenal glands are within normal limits. Kidneys show no hydronephrosis. Punctate nonobstructing right kidney stones. The bladder is distended Stomach/Bowel: Stomach nonenlarged. No dilated small bowel. Possible mild wall thickening at the hepatic flexure. Nonvisualized appendix. Vascular/Lymphatic: Advanced aortic atherosclerosis. No aneurysm. No suspicious lymph nodes. Reproductive: Prostate is unremarkable. Other: Negative for pelvic effusion or free air. Musculoskeletal: No acute or suspicious osseous abnormality. IMPRESSION: 1. Small right-sided pleural effusion. Mild ground-glass density in the right lower lobe could be due to mild pneumonia. 2. Distended gallbladder without calcified stone. Dilated common bile duct measuring up to 19 mm with suspicion of intra hepatic biliary dilatation. Suggest correlation with LFTs and follow-up MRCP as indicated. 3. Possible mild wall thickening at the hepatic flexure of the colon, correlate for any history of diarrhea/colitis type symptoms 4. Punctate nonobstructing right kidney stones. 5. Aortic atherosclerosis. Aortic Atherosclerosis (ICD10-I70.0). Electronically Signed   By: Jasmine Pang M.D.   On: 01/14/2023 15:36   DG Abd Portable 1V  Result Date: 01/14/2023 CLINICAL DATA:  [id number] Pain [id number] EXAM: PORTABLE ABDOMEN - 1 VIEW COMPARISON:  CT 12/20/2021 FINDINGS: Effusion and airspace disease at the right lung base. Stomach and small bowel are nondilated. Moderate colonic fecal material without dilatation. Regional bones unremarkable. Left common iliac and right femoral vascular stents. IMPRESSION: Nonobstructive bowel gas pattern with moderate colonic fecal material. Electronically Signed   By: Corlis Leak M.D.   On: 01/14/2023 14:34   DG Chest Port 1 View  Result Date: 01/14/2023 CLINICAL DATA:  Pain. EXAM: PORTABLE CHEST 1 VIEW COMPARISON:  03/05/2022. FINDINGS: Rotated patient. No consolidation or pulmonary edema. Chronic scarring in the right lung base. Normal heart size. Stable mediastinal contours with postoperative changes of median sternotomy and CABG. No pleural effusion or pneumothorax. Visualized bones and upper abdomen are unremarkable. IMPRESSION: No evidence of acute cardiopulmonary disease. Electronically Signed   By: Orvan Falconer M.D.   On: 01/14/2023 12:18   CT HEAD WO CONTRAST  Result Date: 01/14/2023 CLINICAL DATA:  Neuro deficit. Stroke suspected. Weakness and confusion. EXAM: CT HEAD WITHOUT CONTRAST TECHNIQUE: Contiguous axial images were obtained from the base of the skull through the vertex without intravenous contrast. RADIATION DOSE REDUCTION: This exam was performed  according to the departmental dose-optimization program which includes automated exposure control, adjustment of the mA and/or kV according to patient size and/or use of iterative reconstruction technique. COMPARISON:  12/26/2021 FINDINGS: Brain: No evidence of acute infarction, hemorrhage, hydrocephalus, extra-axial collection or mass lesion/mass effect. There is mild low-attenuation within the subcortical and periventricular white matter compatible with chronic microvascular disease. Prominence of the sulci and ventricles compatible with brain atrophy. Vascular: No hyperdense vessel or unexpected calcification. Skull: Normal. Negative for fracture or focal lesion. Sinuses/Orbits: Postoperative changes involving the paranasal sinuses. Mucosal thickening is noted involving bilateral maxillary sinuses. No sinus fluid levels. Other: None IMPRESSION: 1. No acute intracranial abnormalities. 2. Chronic microvascular disease and brain atrophy. Electronically Signed   By: Signa Kell M.D.   On: 01/14/2023 12:10     Micro Results     No results found for this or any previous visit (from the past 240 hour(s)).  Today   Subjective    [name]   Patient in bed appears to be in no discomfort, denies chest pain, sleepy.   Objective   Blood pressure 127/68, pulse (!) 136, temperature 97.8 F (36.6 C), temperature source Oral, resp. rate (!) 21, height 5\' 1"  (1.549 m), weight 43.5 kg, SpO2 100 %.   Intake/Output Summary (Last 24 hours) at 02/01/2023 1605 Last data filed at 02/01/2023 1234 Gross per 24 hour  Intake 467.1 ml  Output 850 ml  Net -382.9 ml    Exam  Sleepy, in no distress [place].AT,PERRAL Supple Neck,   Symmetrical Chest wall movement, Good air movement bilaterally,   Rapid RRR,No Gallops,   +ve B.Sounds, Abd Soft, Non tender,  Right AKA stump site stable    Data Review   Recent Labs  Lab 01/26/23 0233 01/27/23 0643 01/28/23 0725 01/29/23 0624 01/30/23 0243  WBC 11.7* 12.3* 14.6* 18.8* 20.8*  HGB 8.7* 8.2* 8.7* 8.8* 7.4*  HCT 27.4* 26.2* 27.9* 28.1* 23.8*  PLT 352 343 375 410* 365  MCV 87.8 91.0 93.3 92.1 91.2  MCH 27.9 28.5 29.1 28.9 28.4  MCHC 31.8 31.3 31.2 31.3 31.1  RDW 14.2 14.3 14.3 14.3 14.2  LYMPHSABS  --   --  0.3* 0.5* 0.2*  MONOABS  --   --  1.0 1.2* 0.9  EOSABS  --   --  0.0 0.0 0.0  BASOSABS  --   --  0.0 0.0 0.0    Recent Labs  Lab 01/26/23 0233 01/27/23 0643 01/28/23 0725 01/29/23 0624 01/30/23 0243  NA 128* 132* 133* 130* 135  K 4.9 4.1 4.0 3.7 3.9  CL 92* 99 99 96* 100  CO2 26 22 23  21* 24  ANIONGAP 10 11 11 13 11   GLUCOSE 145* 125* 209* 250* 271*  BUN 28* 19 17 26* 27*  CREATININE 1.26* 1.07 1.12 1.16 1.37*  CRP 5.2* 8.6*  --   --  17.9*  PROCALCITON 0.41 0.18 0.14  --  7.15  BNP 223.8* 314.1* 1,121.9* 414.3* 902.9*  MG 1.8 1.8 2.0 1.7 1.9  CALCIUM 9.2 8.7* 8.9 9.0 9.0    Total Time in preparing paper work, data evaluation and todays exam - 35 minutes  Signature  -    Susa Raring M.D on 02/01/2023 at 4:05 PM   -  To  page go to [email or web address]

## 2023-02-01 NOTE — Progress Notes (Signed)
PROGRESS NOTE        PATIENT DETAILS Name: Reginald Ayers Age: 72 y.o. Sex: male Date of Birth: Aug 27, 1950 Admit Date: 01/14/2023 Admitting Physician Jonah Blue, MD ZOX:[id number], Selena Batten, NP  Brief Summary: Patient is a 72 y.o.  male COPD, CAD s/p CABG, PAD s/p stent, HLD, HTN-who presented with weakness.  Per H&P-patient was found at home with bedbugs, and covered in fecal matter.   Significant events: 5/23>> admit to [facility]-weakness/nausea/vomiting-found covered in fecal matter/bedbugs. 5/24>> MRCP with choledocholithiasis 5/28>> ERCP-sphincterotomy 5/29>> evaluation by cardiology-high risk for cholecystectomy 5/30>> General surgery signed off-no plans to pursue cholecystectomy. 6/01>> right foot toes-purple-right foot is cold.  Vascular surgery consulted.  Significant studies: 5/23>> MRI brain: No acute abnormality-chronically occluded left ICA 5/23>> CT abdomen/pelvis: Mild groundglass density in the right lower lobe, distended gallbladder  without calcified stone, dilated CBD measuring up to 19 mm with suspicion for intrahepatic dilatation. 5/24>> MRCP:Severe intrahepatic/extrahepatic biliary ductal dilatation-tiny gallstones/sludge within the cystic duct near the ampulla.  Significant microbiology data: None  Procedures: 5/28>> ERCP-sphincterotomy 01/27/2023.  Right AKA by vascular surgery  Consults: GI General surgery Cardiology Vascular surgery  Subjective:   Patient in bed appears to be in no discomfort, full comfort measures on morphine and Ativan, all known comfort medications have been stopped.   Objective: Vitals: Blood pressure (!) 165/80, pulse (!) 129, temperature 97.8 F (36.6 C), temperature source Oral, resp. rate (!) 26, height 5\' 1"  (1.549 m), weight 43.5 kg, SpO2 99 %.   Exam:  Awake Alert, No new F.N deficits, Foley catheter in place, [place].AT,PERRAL Supple Neck, No JVD,   Symmetrical Chest wall movement, Good air movement  bilaterally, coarse bilateral breath sounds RRR,No Gallops, Rubs or new Murmurs,  +ve B.Sounds, Abd Soft, No tenderness,   Right AKA stump site stable   Assessment/Plan:  Despite appropriate treatment patient continued to decline with multiple chronic and acute comorbidities, finally went being nausea vomiting and aspiration pneumonia requiring BiPAP, transition to full comfort measures now.  Other medical issues addressed earlier this admission are below.  All non comfort medications have been discontinued now.     Weakness/nausea/vomiting - Dilated bile ducts secondary to choledocholithiasis -  Poor historian-unclear etiology-given MRCP findings-concerned that his clinical presentation on admission could have been due to a passed CBD stone/symptomatic choledocholithiasis. He is s/p ERCP on 5/28-not felt to be a good candidate for cholecystectomy given high risk profile. Nausea/vomiting has resolved-he is tolerating regular diet.  Anion gap metabolic acidosis  - Likely starvation ketoacidosis-due to poor intake/possible vomiting-in the setting of possible passed CBD stone. Improving with supportive care  PAD, Right foot ischemic foot - Has had numerous procedures in the past-follow with Dr. Myra Gianotti, Prior to this hospitalization he was on aspirin/Plavix/Eliquis - [facility] following, continue IV heparin and Plavix, per vascular surgery this is likely due to his chronic PAD getting worse, seen by vascular surgery and underwent AKA on 01/27/2023, continue pain control, agreeable for transfusion if needed.  Constipation related nausea vomiting with aspiration pneumonia on 01/29/2023.  Placed on Unasyn, clear liquid diet, bowel regimen for constipation, oxygen, nebulizer treatment, chest PT with suctioning, discussed with RT and RN.  Clear liquid diet for now.  Speech to evaluate.  Continue to get worse requiring BiPAP, transitioned eventually to full comfort measures after discussions between palliative care,  patient and patient's niece on  01/30/2023.  History of CVA No obvious deficits on exam-MRI brain negative Continue statin See below regarding antiplatelets.  PAF In RVR on 01/30/2023, Cardizem drip, oral Cardizem overlap, Eliquis  AKI Mild Continue to monitor closely.  Urinary retention.  Foley and Flomax started on 01/29/2023.   Hyponatremia SIADH.  Resolved after [place]  HTN BP better controlled with amlodipine/metoprolol.  CAD-s/p CABG No anginal symptoms See below regarding antiplatelets. Continue statin/beta-blocker/antiplatelets  Constipation.  Placed on bowel regimen.    Acute toxic metabolic encephalopathy Somewhat hard to arouse this morning-but much more easily aroused this afternoon.  ABG stable-nonfocal exam. Suspect this may be due to gabapentin-will discontinue and monitor closely.  COPD Stable Bronchodilators    Debility/deconditioning Failure to thrive Secondary to acute illness-frailty at baseline [facility] refuses SNF-wants to go home with home health.  Understands risk of rehospitalization/decompensation.  Claims lives with 2 roommates-and has 7 dogs that he misses.  DM-2 CBGs on the higher side-but due to lingering encephalopathy today-will not adjust insulin doses-as oral intake is poor Continue Semglee 14 units daily, 4 units of NovoLog with send SSI. Watch closely-he is very frail-and is not a candidate for aggressive glycemic control.    Recent Labs    01/30/23 2045 01/31/23 0759 02/01/23 0819  GLUCAP 200* 105* 266*    Code status:   Code Status: DNR   DVT Prophylaxis: IV heparin gtt   Family Communication: Niece-[name] [phone]-left VM on 5/25, 5/26,5/27   Disposition Plan:   The patient will require care spanning > 2 midnights and should be moved to inpatient because: Severity of illness.   Planned Discharge Destination:Home health   Diet: Diet Order             Diet regular Room service appropriate? Yes; Fluid  consistency: Thin  Diet effective now                     Antimicrobial agents: Anti-infectives (From admission, onward)    Start     Dose/Rate Route Frequency Ordered Stop   01/29/23 1530  Ampicillin-Sulbactam (UNASYN) 3 g in sodium chloride 0.9 % 100 mL IVPB  Status:  Discontinued        3 g 200 mL/hr over 30 Minutes Intravenous Every 6 hours 01/29/23 1444 02/01/23 0746   01/19/23 1130  ciprofloxacin (CIPRO) IVPB 400 mg        400 mg 200 mL/hr over 60 Minutes Intravenous  Once 01/19/23 1037 01/19/23 1230   01/14/23 1745  cefTRIAXone (ROCEPHIN) 1 g in sodium chloride 0.9 % 100 mL IVPB        1 g 200 mL/hr over 30 Minutes Intravenous  Once 01/14/23 1741 01/14/23 1922   01/14/23 1745  azithromycin (ZITHROMAX) 500 mg in sodium chloride 0.9 % 250 mL IVPB        500 mg 250 mL/hr over 60 Minutes Intravenous  Once 01/14/23 1741 01/14/23 2315        MEDICATIONS: Scheduled Meds:  umeclidinium bromide  1 puff Inhalation QHS   Continuous Infusions:   PRN Meds:.acetaminophen, albuterol, alum & mag hydroxide-simeth, antiseptic oral rinse, bisacodyl, glycopyrrolate **OR** glycopyrrolate **OR** glycopyrrolate, haloperidol **OR** haloperidol **OR** haloperidol lactate, LORazepam **OR** LORazepam **OR** LORazepam, morphine CONCENTRATE **OR** morphine injection, ondansetron (ZOFRAN) IV, polyethylene glycol, polyvinyl alcohol   I have personally reviewed following labs and imaging studies  LABORATORY DATA:  Recent Labs  Lab 01/26/23 0233 01/27/23 0643 01/28/23 0725 01/29/23 0624 01/30/23 0243  WBC 11.7* 12.3* 14.6* 18.8* 20.8*  HGB 8.7* 8.2* 8.7* 8.8* 7.4*  HCT 27.4* 26.2* 27.9* 28.1* 23.8*  PLT 352 343 375 410* 365  MCV 87.8 91.0 93.3 92.1 91.2  MCH 27.9 28.5 29.1 28.9 28.4  MCHC 31.8 31.3 31.2 31.3 31.1  RDW 14.2 14.3 14.3 14.3 14.2  LYMPHSABS  --   --  0.3* 0.5* 0.2*  MONOABS  --   --  1.0 1.2* 0.9  EOSABS  --   --  0.0 0.0 0.0  BASOSABS  --   --  0.0 0.0 0.0     Recent Labs  Lab 01/26/23 0233 01/27/23 0643 01/28/23 0725 01/29/23 0624 01/30/23 0243  NA 128* 132* 133* 130* 135  K 4.9 4.1 4.0 3.7 3.9  CL 92* 99 99 96* 100  CO2 26 22 23  21* 24  ANIONGAP 10 11 11 13 11   GLUCOSE 145* 125* 209* 250* 271*  BUN 28* 19 17 26* 27*  CREATININE 1.26* 1.07 1.12 1.16 1.37*  CRP 5.2* 8.6*  --   --  17.9*  PROCALCITON 0.41 0.18 0.14  --  7.15  BNP 223.8* 314.1* 1,121.9* 414.3* 902.9*  MG 1.8 1.8 2.0 1.7 1.9  CALCIUM 9.2 8.7* 8.9 9.0 9.0    Lab Results  Component Value Date   CHOL 127 01/15/2023   HDL 28 (L) 01/15/2023   LDLCALC 63 01/15/2023   TRIG 179 (H) 01/15/2023   CHOLHDL 4.5 01/15/2023      Recent Labs  Lab 01/26/23 0233 01/27/23 0643 01/28/23 0725 01/29/23 0624 01/30/23 0243  CRP 5.2* 8.6*  --   --  17.9*  PROCALCITON 0.41 0.18 0.14  --  7.15  BNP 223.8* 314.1* 1,121.9* 414.3* 902.9*  MG 1.8 1.8 2.0 1.7 1.9  CALCIUM 9.2 8.7* 8.9 9.0 9.0   MICROBIOLOGY: No results found for this or any previous visit (from the past 240 hour(s)).  RADIOLOGY STUDIES/RESULTS: No results found.   LOS: 17 days   Signature  -    Susa Raring M.D on 02/01/2023 at 8:24 AM   -  To page go to [email or web address]

## 2023-02-01 NOTE — Progress Notes (Signed)
[facility]  Subjective  - Voice is weak and I could not understand him this am.   Objective (!) 165/80 (!) 129 97.9 F (36.6 C) (Axillary) (!) 26 99%  Intake/Output Summary (Last 24 hours) at 02/01/2023 0701 Last data filed at 02/01/2023 0530 Gross per 24 hour  Intake 667.1 ml  Output 550 ml  Net 117.1 ml    Right AKA healing without signs of infection Incision open to air Lungs non labored breathing  Assessment/Planning: S/P right AKA  Right AKA stump appears viable   Reginald Ayers 02/01/2023 7:01 AM --  Laboratory Lab Results: Recent Labs    01/30/23 0243  WBC 20.8*  HGB 7.4*  HCT 23.8*  PLT 365   BMET Recent Labs    01/30/23 0243  NA 135  K 3.9  CL 100  CO2 24  GLUCOSE 271*  BUN 27*  CREATININE 1.37*  CALCIUM 9.0    COAG Lab Results  Component Value Date   INR 1.2 01/14/2023   INR 1.5 10/26/2008   INR 1.1 10/24/2008   No results found for: "PTT"

## 2023-02-01 NOTE — TOC Progression Note (Addendum)
Transition of Care [facility]) - Progression Note    Patient Details  Name: Reginald Ayers MRN: [id number] Date of Birth: 06/08/1951  [facility] CM/SW Contact  Carley Hammed, LCSW Phone Number: 02/01/2023, 3:15 PM  Clinical Narrative:     CS[facility] with [facility] and confirmed they are currently reviewing pt for [facility] hospice admission. They will contact CSW when care coordinator has finished reviewing. TOC will continue to follow for DC needs.   3:20 Hospice is sending a coordinator to bedside to review pt, will follow up with determination.   Expected Discharge Plan: Skilled Nursing Facility Barriers to Discharge: Continued Medical Work up  Expected Discharge Plan and Services In-house Referral: Clinical Social Work   Post Acute Care Choice: Skilled Nursing Facility Living arrangements for the past 2 months: Boarding House                                       Social Determinants of Health (SDOH) Interventions SDOH Screenings   Housing: Medium Risk (01/15/2023)  Tobacco Use: High Risk (01/28/2023)    Readmission Risk Interventions    01/12/2022   10:29 AM  Readmission Risk Prevention Plan  Transportation Screening Complete  PCP or Specialist Appt within 3-5 Days Complete  HRI or Home Care Consult Complete  Social Work Consult for Recovery Care Planning/Counseling Complete  Palliative Care Screening Not Applicable  Medication Review Oceanographer) Complete

## 2023-02-01 NOTE — Progress Notes (Signed)
Daily Progress Note   Patient Name: Reginald Ayers       Date: 02/01/2023 DOB: Apr 13, 1951  Age: 72 y.o. MRN#: [id number] Attending Physician: Leroy Sea, MD Primary Care Physician: Loura Back, NP Admit Date: 01/14/2023  Reason for Consultation/Follow-up: Non pain symptom management, Pain control, Psychosocial/spiritual support, and Terminal Care  Subjective: I have reviewed medical records including EPIC notes, MAR, and labs. Patient assessed at the bedside and is sleeping comfortably. No family present during my visit.  He has had 3 doses of as needed morphine in the past 24 hours.  Length of Stay: 17  Physical Exam Vitals and nursing note reviewed.  Constitutional:      General: He is not in acute distress.    Appearance: He is ill-appearing.  Pulmonary:     Effort: No respiratory distress.  Skin:    General: Skin is warm and dry.  Neurological:     Motor: Weakness present.            Vital Signs: BP (!) 165/80 (BP Location: Left Arm)   Pulse (!) 129   Temp 97.8 F (36.6 C) (Oral)   Resp (!) 26   Ht 5\' 1"  (1.549 m)   Wt 43.5 kg   SpO2 99%   BMI 18.12 kg/m  SpO2: SpO2: 99 % O2 Device: O2 Device: Nasal Cannula O2 Flow Rate: O2 Flow Rate (L/min): 8 L/min       Palliative Assessment/Data: PPS 10%    Palliative Care Assessment & Plan   Patient Profile: 72 y.o. male  with past medical history of COPD, CAD s/p CABG, PAD s/p stent, HLD, and HTN who presented to the ED on 01/14/2023 with weakness, nausea, and vomiting.  Per H&P, patient was found at home with bedbugs and covered in fecal matter. MRCP on 5/23 showed choledocholelithiasis, however he was ultimately deemed too high risk for cholecystectomy.  On 6/1, there was concern for ischemic changes to his right toes  and foot, he was evaluated by vascular surgery, and underwent right AKA on 6/5.  Assessment: Principal Problem:   Increased anion gap metabolic acidosis Active Problems:   Hx of CABG Feb 2010   Peripheral arterial disease (HCC)   Diabetes (HCC)   Essential hypertension   COPD (chronic obstructive pulmonary disease) (HCC)   Stage 3a chronic  kidney disease (HCC)   Right sided weakness   DNR (do not resuscitate)   Terminal care  Recommendations/Plan: Continue DNR/DNI  Continue full comfort measures, no adjustments required today Transfer to [facility] when bed available  PMT will continue to follow and support holistically   Prognosis:  < 2 weeks  Discharge Planning: Hospice facility    Total time: I spent 35 minutes in the care of the patient today in the above activities and documenting the encounter.    Richardson Dopp, PA-C Palliative Medicine Team Team phone # [phone]  Thank you for allowing the Palliative Medicine Team to assist in the care of this patient. Please utilize secure chat with additional questions, if there is no response within 30 minutes please call the above phone number.  Palliative Medicine Team providers are available by phone from 7am to 7pm daily and can be reached through the team cell phone.  Should this patient require assistance outside of these hours, please call the patient's attending physician.  Portions of this note are a verbal dictation therefore any spelling and/or grammatical errors are due to the "[employer]agon [employer] One" system interpretation.

## 2023-02-01 NOTE — TOC Transition Note (Signed)
Transition of Care [facility]) - CM/SW Discharge Note   Patient Details  Name: Reginald Ayers MRN: [id number] Date of Birth: 06-05-1951  Transition [facility]) CM/SW Contact:  Gordy Clement, RN Phone Number: 02/01/2023, 4:18 PM   Clinical Narrative:     Patient will DC to [facility] today.  DC Summary is in- PTAR has been arranged by [facility]. Niece is aware  Renette Butters DNR in DC envelope       Barriers to Discharge: Continued Medical Work up   Patient Goals and CMS Choice CMS Medicare.gov Compare Post Acute Care list provided to:: Patient Choice offered to / list presented to : Patient  Discharge Placement                         Discharge Plan and Services Additional resources added to the After Visit Summary for   In-house Referral: Clinical Social Work   Post Acute Care Choice: Skilled Nursing Facility                               Social Determinants of Health (SDOH) Interventions SDOH Screenings   Housing: Medium Risk (01/15/2023)  Tobacco Use: High Risk (01/28/2023)     Readmission Risk Interventions    01/12/2022   10:29 AM  Readmission Risk Prevention Plan  Transportation Screening Complete  PCP or Specialist Appt within 3-5 Days Complete  HRI or Home Care Consult Complete  Social Work Consult for Recovery Care Planning/Counseling Complete  Palliative Care Screening Not Applicable  Medication Review Oceanographer) Complete

## 2023-02-01 NOTE — Progress Notes (Signed)
Nutrition Brief Note  Chart reviewed. Pt transitioned to comfort care. Ensure's have been discontinued, regular diet ordered. Allow pt to eat for comfort as desires.    No further nutrition interventions planned at this time. Please re-consult as needed.   Kirby Crigler RD, LDN Clinical Dietitian See Loretha Stapler for contact information.

## 2023-02-22 DEATH — deceased

## 2023-03-08 ENCOUNTER — Ambulatory Visit (HOSPITAL_COMMUNITY): Payer: Federal, State, Local not specified - PPO

## 2023-03-08 ENCOUNTER — Ambulatory Visit: Payer: Medicare Other

## 2023-11-19 ENCOUNTER — Other Ambulatory Visit: Payer: Self-pay | Admitting: Surgery

## 2023-11-19 DIAGNOSIS — I70233 Atherosclerosis of native arteries of right leg with ulceration of ankle: Secondary | ICD-10-CM

## 2023-11-19 DIAGNOSIS — I739 Peripheral vascular disease, unspecified: Secondary | ICD-10-CM

## 2023-11-19 DIAGNOSIS — I70213 Atherosclerosis of native arteries of extremities with intermittent claudication, bilateral legs: Secondary | ICD-10-CM
